# Patient Record
Sex: Male | Born: 1945
Health system: Southern US, Community
[De-identification: ages and names within clinical notes are randomized; demographics above are authoritative.]

## PROBLEM LIST (undated history)

## (undated) DIAGNOSIS — I219 Acute myocardial infarction, unspecified: Secondary | ICD-10-CM

## (undated) DIAGNOSIS — D649 Anemia, unspecified: Secondary | ICD-10-CM

## (undated) DIAGNOSIS — R0902 Hypoxemia: Secondary | ICD-10-CM

## (undated) DIAGNOSIS — IMO0002 Reserved for concepts with insufficient information to code with codable children: Secondary | ICD-10-CM

## (undated) DIAGNOSIS — A419 Sepsis, unspecified organism: Secondary | ICD-10-CM

## (undated) DIAGNOSIS — Z8711 Personal history of peptic ulcer disease: Secondary | ICD-10-CM

## (undated) DIAGNOSIS — E079 Disorder of thyroid, unspecified: Secondary | ICD-10-CM

## (undated) DIAGNOSIS — M1990 Unspecified osteoarthritis, unspecified site: Secondary | ICD-10-CM

## (undated) DIAGNOSIS — Z8719 Personal history of other diseases of the digestive system: Secondary | ICD-10-CM

## (undated) DIAGNOSIS — Z9889 Other specified postprocedural states: Secondary | ICD-10-CM

## (undated) DIAGNOSIS — M797 Fibromyalgia: Secondary | ICD-10-CM

## (undated) DIAGNOSIS — R112 Nausea with vomiting, unspecified: Secondary | ICD-10-CM

## (undated) DIAGNOSIS — G4733 Obstructive sleep apnea (adult) (pediatric): Secondary | ICD-10-CM

## (undated) DIAGNOSIS — R350 Frequency of micturition: Secondary | ICD-10-CM

## (undated) DIAGNOSIS — Z9289 Personal history of other medical treatment: Secondary | ICD-10-CM

## (undated) DIAGNOSIS — K219 Gastro-esophageal reflux disease without esophagitis: Secondary | ICD-10-CM

## (undated) DIAGNOSIS — Z972 Presence of dental prosthetic device (complete) (partial): Secondary | ICD-10-CM

## (undated) DIAGNOSIS — G4730 Sleep apnea, unspecified: Secondary | ICD-10-CM

## (undated) DIAGNOSIS — N189 Chronic kidney disease, unspecified: Secondary | ICD-10-CM

## (undated) DIAGNOSIS — L409 Psoriasis, unspecified: Secondary | ICD-10-CM

## (undated) DIAGNOSIS — Z973 Presence of spectacles and contact lenses: Secondary | ICD-10-CM

## (undated) DIAGNOSIS — Z95810 Presence of automatic (implantable) cardiac defibrillator: Secondary | ICD-10-CM

## (undated) DIAGNOSIS — I447 Left bundle-branch block, unspecified: Secondary | ICD-10-CM

## (undated) DIAGNOSIS — H9319 Tinnitus, unspecified ear: Secondary | ICD-10-CM

## (undated) DIAGNOSIS — R399 Unspecified symptoms and signs involving the genitourinary system: Secondary | ICD-10-CM

## (undated) DIAGNOSIS — C679 Malignant neoplasm of bladder, unspecified: Secondary | ICD-10-CM

## (undated) DIAGNOSIS — K5730 Diverticulosis of large intestine without perforation or abscess without bleeding: Secondary | ICD-10-CM

## (undated) DIAGNOSIS — I509 Heart failure, unspecified: Secondary | ICD-10-CM

## (undated) DIAGNOSIS — I10 Essential (primary) hypertension: Secondary | ICD-10-CM

## (undated) DIAGNOSIS — Z8709 Personal history of other diseases of the respiratory system: Secondary | ICD-10-CM

## (undated) HISTORY — PX: SMALL INTESTINE SURGERY: SHX150

## (undated) HISTORY — PX: APPENDECTOMY: SHX54

## (undated) HISTORY — DX: Chronic kidney disease, unspecified: N18.9

## (undated) HISTORY — DX: Anemia, unspecified: D64.9

## (undated) HISTORY — DX: Hypoxemia: R09.02

## (undated) HISTORY — PX: PROSTATE SURGERY: SHX751

## (undated) HISTORY — DX: Reserved for concepts with insufficient information to code with codable children: IMO0002

## (undated) HISTORY — DX: Disorder of thyroid, unspecified: E07.9

## (undated) HISTORY — PX: FRACTURE SURGERY: SHX138

## (undated) HISTORY — DX: Acute myocardial infarction, unspecified: I21.9

## (undated) HISTORY — PX: OTHER SURGICAL HISTORY: SHX169

## (undated) HISTORY — PX: KNEE ARTHROSCOPY: SUR90

## (undated) HISTORY — DX: Heart failure, unspecified: I50.9

## (undated) HISTORY — PX: HEMORROIDECTOMY: SUR656

## (undated) HISTORY — PX: TONSILLECTOMY: SUR1361

## (undated) HISTORY — PX: JOINT REPLACEMENT: SHX530

---

## 1974-06-01 HISTORY — PX: OTHER SURGICAL HISTORY: SHX169

## 1982-06-01 HISTORY — PX: INGUINAL HERNIA REPAIR: SUR1180

## 1994-06-01 HISTORY — PX: OTHER SURGICAL HISTORY: SHX169

## 1994-06-01 HISTORY — PX: COLOSTOMY TAKEDOWN: SHX5783

## 2002-11-01 ENCOUNTER — Encounter: Admission: RE | Admit: 2002-11-01 | Discharge: 2002-11-01 | Payer: Self-pay | Admitting: Neurosurgery

## 2002-11-01 ENCOUNTER — Encounter: Payer: Self-pay | Admitting: Neurosurgery

## 2003-06-02 HISTORY — PX: OTHER SURGICAL HISTORY: SHX169

## 2004-04-08 ENCOUNTER — Ambulatory Visit: Payer: Self-pay | Admitting: Family Medicine

## 2004-06-01 HISTORY — PX: TOTAL KNEE ARTHROPLASTY: SHX125

## 2005-03-03 ENCOUNTER — Encounter
Admission: RE | Admit: 2005-03-03 | Discharge: 2005-06-01 | Payer: Self-pay | Admitting: Physical Medicine & Rehabilitation

## 2005-03-03 ENCOUNTER — Ambulatory Visit: Payer: Self-pay | Admitting: Physical Medicine & Rehabilitation

## 2005-03-25 ENCOUNTER — Encounter: Admission: RE | Admit: 2005-03-25 | Discharge: 2005-04-03 | Payer: Self-pay | Admitting: *Deleted

## 2005-05-11 ENCOUNTER — Inpatient Hospital Stay (HOSPITAL_COMMUNITY): Admission: RE | Admit: 2005-05-11 | Discharge: 2005-05-16 | Payer: Self-pay | Admitting: Orthopedic Surgery

## 2005-06-08 ENCOUNTER — Encounter: Admission: RE | Admit: 2005-06-08 | Discharge: 2005-09-06 | Payer: Self-pay | Admitting: Orthopedic Surgery

## 2005-09-07 ENCOUNTER — Encounter: Admission: RE | Admit: 2005-09-07 | Discharge: 2005-11-25 | Payer: Self-pay | Admitting: Orthopedic Surgery

## 2005-10-16 ENCOUNTER — Inpatient Hospital Stay (HOSPITAL_COMMUNITY): Admission: RE | Admit: 2005-10-16 | Discharge: 2005-10-20 | Payer: Self-pay | Admitting: Orthopedic Surgery

## 2005-10-16 ENCOUNTER — Ambulatory Visit: Payer: Self-pay | Admitting: Internal Medicine

## 2005-10-28 ENCOUNTER — Inpatient Hospital Stay (HOSPITAL_COMMUNITY): Admission: RE | Admit: 2005-10-28 | Discharge: 2005-10-31 | Payer: Self-pay | Admitting: Orthopedic Surgery

## 2005-11-05 ENCOUNTER — Ambulatory Visit: Payer: Self-pay | Admitting: Internal Medicine

## 2005-11-25 ENCOUNTER — Encounter: Admission: RE | Admit: 2005-11-25 | Discharge: 2006-02-18 | Payer: Self-pay | Admitting: Orthopedic Surgery

## 2005-12-23 ENCOUNTER — Ambulatory Visit: Payer: Self-pay | Admitting: Internal Medicine

## 2006-04-13 ENCOUNTER — Ambulatory Visit: Payer: Self-pay | Admitting: Internal Medicine

## 2006-04-13 LAB — CONVERTED CEMR LAB: CRP: 0.6 mg/dL — ABNORMAL HIGH (ref <0.6)

## 2006-06-22 ENCOUNTER — Ambulatory Visit: Payer: Self-pay | Admitting: Internal Medicine

## 2006-06-22 LAB — CONVERTED CEMR LAB
BUN: 13 mg/dL (ref 6–23)
CRP: 1.8 mg/dL — ABNORMAL HIGH (ref <0.6)
Calcium: 9.9 mg/dL (ref 8.4–10.5)
Creatinine, Ser: 0.92 mg/dL (ref 0.40–1.50)
HCT: 43.9 % (ref 39.0–52.0)
MCHC: 34.4 g/dL (ref 30.0–36.0)
MCV: 93 fL (ref 78.0–100.0)
Platelets: 151 10*3/uL (ref 150–400)
RDW: 13.2 % (ref 11.5–14.0)

## 2006-08-26 DIAGNOSIS — L03119 Cellulitis of unspecified part of limb: Secondary | ICD-10-CM

## 2006-08-26 DIAGNOSIS — L02419 Cutaneous abscess of limb, unspecified: Secondary | ICD-10-CM | POA: Insufficient documentation

## 2012-04-11 ENCOUNTER — Ambulatory Visit: Payer: Medicare Other | Attending: Orthopedic Surgery | Admitting: Physical Therapy

## 2012-04-11 DIAGNOSIS — IMO0001 Reserved for inherently not codable concepts without codable children: Secondary | ICD-10-CM | POA: Insufficient documentation

## 2012-04-11 DIAGNOSIS — M25519 Pain in unspecified shoulder: Secondary | ICD-10-CM | POA: Insufficient documentation

## 2012-04-11 DIAGNOSIS — R5381 Other malaise: Secondary | ICD-10-CM | POA: Insufficient documentation

## 2013-03-23 ENCOUNTER — Encounter (HOSPITAL_COMMUNITY): Payer: Self-pay | Admitting: Pharmacy Technician

## 2013-03-28 ENCOUNTER — Ambulatory Visit (HOSPITAL_COMMUNITY)
Admission: RE | Admit: 2013-03-28 | Discharge: 2013-03-28 | Disposition: A | Discharge status: Home / Self Care | Payer: Medicare Other | Source: Ambulatory Visit | Attending: Anesthesiology | Admitting: Anesthesiology

## 2013-03-28 ENCOUNTER — Encounter (HOSPITAL_COMMUNITY)
Admission: RE | Admit: 2013-03-28 | Discharge: 2013-03-28 | Disposition: A | Discharge status: Home / Self Care | Payer: Medicare Other | Source: Ambulatory Visit | Attending: Orthopedic Surgery | Admitting: Orthopedic Surgery

## 2013-03-28 ENCOUNTER — Encounter (HOSPITAL_COMMUNITY): Payer: Self-pay

## 2013-03-28 DIAGNOSIS — Z01810 Encounter for preprocedural cardiovascular examination: Secondary | ICD-10-CM | POA: Insufficient documentation

## 2013-03-28 DIAGNOSIS — Z01818 Encounter for other preprocedural examination: Secondary | ICD-10-CM | POA: Insufficient documentation

## 2013-03-28 DIAGNOSIS — I517 Cardiomegaly: Secondary | ICD-10-CM | POA: Insufficient documentation

## 2013-03-28 DIAGNOSIS — Z01812 Encounter for preprocedural laboratory examination: Secondary | ICD-10-CM | POA: Insufficient documentation

## 2013-03-28 HISTORY — DX: Fibromyalgia: M79.7

## 2013-03-28 HISTORY — DX: Sleep apnea, unspecified: G47.30

## 2013-03-28 HISTORY — DX: Unspecified osteoarthritis, unspecified site: M19.90

## 2013-03-28 HISTORY — DX: Other specified postprocedural states: Z98.890

## 2013-03-28 HISTORY — DX: Nausea with vomiting, unspecified: R11.2

## 2013-03-28 HISTORY — DX: Essential (primary) hypertension: I10

## 2013-03-28 LAB — TYPE AND SCREEN
ABO/RH(D): O NEG
Antibody Screen: NEGATIVE

## 2013-03-28 LAB — BASIC METABOLIC PANEL
BUN: 18 mg/dL (ref 6–23)
CO2: 31 mEq/L (ref 19–32)
Chloride: 96 mEq/L (ref 96–112)
GFR calc Af Amer: >90 mL/min (ref >90)
Potassium: 3.8 mEq/L (ref 3.5–5.1)
Sodium: 136 mEq/L (ref 135–145)

## 2013-03-28 LAB — CBC
HCT: 46.9 % (ref 39.0–52.0)
Hemoglobin: 16.1 g/dL (ref 13.0–17.0)
RBC: 5.31 MIL/uL (ref 4.22–5.81)
WBC: 6.8 10*3/uL (ref 4.0–10.5)

## 2013-03-28 NOTE — Progress Notes (Signed)
Stress Test requested from [facility], Sleep Study requested from [facility].

## 2013-03-28 NOTE — Pre-Procedure Instructions (Signed)
Billy Rasmussen  03/28/2013   Your procedure is scheduled on:  04-06-2013   Thursday   Report to [facility]  2 * 3 at 5:30  AM.  Call this number if you have problems the morning of surgery: [phone]   Remember:   Do not eat food or drink liquids after midnight.   Take these medicines the morning of surgery with A SIP OF WATER: pain medication as needed,tamsulosin(Flomax),afrin nasal spray as needed   Do not wear jewelry,   Do not wear lotions, powders, or perfumes. .  Do not shave 48 hours prior to surgery. Men may shave face and neck.  Do not bring valuables to the hospital.  [facility] is not responsiblefor any belongings or valuables.               Contacts, dentures or bridgework may not be worn into surgery.   Leave suitcase in the car. After surgery it may be brought to your room.  For patients admitted to the hospital, discharge time is determined by your treatment team.               Patients discharged the day of surgery will not be allowed to drive home.    Special Instructions: Shower using CHG 2 nights before surgery and the night before surgery.  If you shower the day of surgery use CHG.  Use special wash - you have one bottle of CHG for all showers.  You should use approximately 1/3 of the bottle for each shower.   Please read over the following fact sheets that you were given: Pain Booklet, Coughing and Deep Breathing, Blood Transfusion Information and Surgical Site Infection Prevention

## 2013-03-29 NOTE — Progress Notes (Signed)
Anesthesia Chart Review:  Patient is a 67 year old male scheduled for right total shoulder arthroplasty on 04/06/13 by Dr. Rennis Chris.  History includes former smoker, obesity (BMI 36), post-operative N/V, HTN, fibromyalgia, anxiety, depression, OSA, arthritis, left TKA, tonsillectomy, appendectomy, IHR, colostomy takedown. PCP is listed as Prudy Feeler, PA-C. Patient was evaluated at [facility] in the past, but not since 2008 and records are now in storage. Patient reports he had a normal stress test with Dr. Tresa Endo at that time.  Meds: albuterol, Biaxin (thur 04/06/13), Voltaren, Colace, Norco PRN, Hydcodan PRN, lisinopril/HCTZ, Afrin, Flomax, testosterone IM.  EKG on 03/28/13 showed SR with marked sinus arrhythmia, incomplete left BBB.  QRS went from 0.11 to 0.118, but otherwise I think it appears stable when compared to prior EKG on 07/14/04.  CXR on 03/28/13 showed: No active lung disease. Stable mild cardiomegaly.   Preoperative labs noted. WBC is WNL.  I called and spoke with patient.  He is currently being treated for bronchitis, and was started on Biaxin ~ 03/27/13.  He was also given albuterol MDI for wheezing.  He continues to have nasal congestion and occasional productive cough, but is starting to feel better.  His wheezing is better.  He denies fever.  Fortunately, his surgery is still 8 days away.  He denies chest pain and SOB at rest.  He denies any significant DOE. He gets mild dependent ankle edema which is better in the mornings.  He is retired but stays fairly active.  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    Above including EKGs reviewed with anesthesiologist Dr. Krista Blue. Patient has been asymptomatic from a CV standpoint and reported a negative stress test in 2008.  If there is new CV symptomology and he is recovered from his bronchitis then I would  anticipate that he could proceed as planned.  I did tell Mr. Novosel that he should let Dr. Dub Mikes office know if he is having fevers, persistent respiratory symptoms, wheezing, generally not feeling well, etc as this would likely mean that his surgery should be postponed and further PCP follow-up may be warranted.  He verbalized understanding.  I also updated Toniann Fail at Dr. Dub Mikes office.    Patient will be further evaluated by his assigned anesthesiologist on the day of surgery.  Velna Ochs [facility]/Anesthesiology Phone [phone] 03/29/2013 3:15 PM

## 2013-04-05 MED ORDER — LACTATED RINGERS IV SOLN: INTRAVENOUS | Status: DC

## 2013-04-05 MED ORDER — DEXTROSE 5 % IV SOLN
3.0000 g | INTRAVENOUS | Status: AC
  Administered 2013-04-06: 3 g via INTRAVENOUS
  Filled 2013-04-05: qty 3000

## 2013-04-06 ENCOUNTER — Inpatient Hospital Stay (HOSPITAL_COMMUNITY): Payer: Medicare Other | Admitting: Anesthesiology

## 2013-04-06 ENCOUNTER — Encounter (HOSPITAL_COMMUNITY): Payer: Medicare Other | Admitting: Vascular Surgery

## 2013-04-06 ENCOUNTER — Encounter (HOSPITAL_COMMUNITY)
Admission: RE | Disposition: A | Discharge status: Home / Self Care | Payer: Self-pay | Source: Ambulatory Visit | Attending: Orthopedic Surgery

## 2013-04-06 ENCOUNTER — Inpatient Hospital Stay (HOSPITAL_COMMUNITY)
Admission: RE | Admit: 2013-04-06 | Discharge: 2013-04-07 | DRG: 483 | Disposition: A | Discharge status: Home / Self Care | Payer: Medicare Other | Source: Ambulatory Visit | Attending: Orthopedic Surgery | Admitting: Orthopedic Surgery

## 2013-04-06 ENCOUNTER — Encounter (HOSPITAL_COMMUNITY): Payer: Self-pay | Admitting: *Deleted

## 2013-04-06 DIAGNOSIS — F329 Major depressive disorder, single episode, unspecified: Secondary | ICD-10-CM | POA: Diagnosis present

## 2013-04-06 DIAGNOSIS — F411 Generalized anxiety disorder: Secondary | ICD-10-CM | POA: Diagnosis present

## 2013-04-06 DIAGNOSIS — Z96659 Presence of unspecified artificial knee joint: Secondary | ICD-10-CM

## 2013-04-06 DIAGNOSIS — Z87891 Personal history of nicotine dependence: Secondary | ICD-10-CM

## 2013-04-06 DIAGNOSIS — M19019 Primary osteoarthritis, unspecified shoulder: Principal | ICD-10-CM

## 2013-04-06 DIAGNOSIS — F3289 Other specified depressive episodes: Secondary | ICD-10-CM | POA: Diagnosis present

## 2013-04-06 DIAGNOSIS — G4730 Sleep apnea, unspecified: Secondary | ICD-10-CM | POA: Diagnosis present

## 2013-04-06 DIAGNOSIS — I10 Essential (primary) hypertension: Secondary | ICD-10-CM | POA: Diagnosis present

## 2013-04-06 DIAGNOSIS — IMO0001 Reserved for inherently not codable concepts without codable children: Secondary | ICD-10-CM | POA: Diagnosis present

## 2013-04-06 HISTORY — PX: TOTAL SHOULDER ARTHROPLASTY: SHX126

## 2013-04-06 SURGERY — ARTHROPLASTY, SHOULDER, TOTAL
Anesthesia: Regional | Site: Shoulder | Laterality: Right | Wound class: Clean

## 2013-04-06 MED ORDER — SODIUM CHLORIDE 0.9 % IR SOLN
Status: DC | PRN
  Administered 2013-04-06: 1000 mL

## 2013-04-06 MED ORDER — WHITE PETROLATUM GEL
Status: AC
  Administered 2013-04-06: 0.2
  Filled 2013-04-06: qty 5

## 2013-04-06 MED ORDER — HYDROMORPHONE HCL PF 1 MG/ML IJ SOLN: 0.2500 mg | INTRAMUSCULAR | Status: DC | PRN

## 2013-04-06 MED ORDER — ROCURONIUM BROMIDE 100 MG/10ML IV SOLN
INTRAVENOUS | Status: DC | PRN
  Administered 2013-04-06: 50 mg via INTRAVENOUS

## 2013-04-06 MED ORDER — HYDROCHLOROTHIAZIDE 12.5 MG PO CAPS
12.5000 mg | ORAL_CAPSULE | Freq: Every day | ORAL | Status: DC
  Filled 2013-04-06: qty 1

## 2013-04-06 MED ORDER — MENTHOL 3 MG MT LOZG: 1.0000 | LOZENGE | OROMUCOSAL | Status: DC | PRN

## 2013-04-06 MED ORDER — DIPHENHYDRAMINE HCL 12.5 MG/5ML PO ELIX: 12.5000 mg | ORAL_SOLUTION | ORAL | Status: DC | PRN

## 2013-04-06 MED ORDER — ALBUTEROL SULFATE HFA 108 (90 BASE) MCG/ACT IN AERS
INHALATION_SPRAY | RESPIRATORY_TRACT | Status: DC | PRN
  Administered 2013-04-06 (×3): 2 via RESPIRATORY_TRACT

## 2013-04-06 MED ORDER — ALUM & MAG HYDROXIDE-SIMETH 200-200-20 MG/5ML PO SUSP: 30.0000 mL | ORAL | Status: DC | PRN

## 2013-04-06 MED ORDER — METOCLOPRAMIDE HCL 5 MG PO TABS
5.0000 mg | ORAL_TABLET | Freq: Three times a day (TID) | ORAL | Status: DC | PRN
  Filled 2013-04-06: qty 2

## 2013-04-06 MED ORDER — POLYETHYLENE GLYCOL 3350 17 G PO PACK: 17.0000 g | PACK | Freq: Every day | ORAL | Status: DC | PRN

## 2013-04-06 MED ORDER — OXYCODONE HCL 5 MG/5ML PO SOLN: 5.0000 mg | Freq: Once | ORAL | Status: DC | PRN

## 2013-04-06 MED ORDER — ACETAMINOPHEN 325 MG PO TABS: 650.0000 mg | ORAL_TABLET | Freq: Four times a day (QID) | ORAL | Status: DC | PRN

## 2013-04-06 MED ORDER — LISINOPRIL 20 MG PO TABS
20.0000 mg | ORAL_TABLET | Freq: Every day | ORAL | Status: DC
  Filled 2013-04-06: qty 1

## 2013-04-06 MED ORDER — HYDROMORPHONE HCL PF 1 MG/ML IJ SOLN
0.2500 mg | INTRAMUSCULAR | Status: DC | PRN
Start: 2013-04-06 — End: 2013-04-07

## 2013-04-06 MED ORDER — PHENYLEPHRINE HCL 10 MG/ML IJ SOLN
INTRAMUSCULAR | Status: DC | PRN
  Administered 2013-04-06 (×2): 80 ug via INTRAVENOUS
  Administered 2013-04-06: 40 ug via INTRAVENOUS
  Administered 2013-04-06: 120 ug via INTRAVENOUS
  Administered 2013-04-06: 80 ug via INTRAVENOUS

## 2013-04-06 MED ORDER — NEOSTIGMINE METHYLSULFATE 1 MG/ML IJ SOLN
INTRAMUSCULAR | Status: DC | PRN
  Administered 2013-04-06: 4 mg via INTRAVENOUS

## 2013-04-06 MED ORDER — BISACODYL 10 MG RE SUPP: 10.0000 mg | Freq: Every day | RECTAL | Status: DC | PRN

## 2013-04-06 MED ORDER — ONDANSETRON HCL 4 MG PO TABS: 4.0000 mg | ORAL_TABLET | Freq: Four times a day (QID) | ORAL | Status: DC | PRN

## 2013-04-06 MED ORDER — TEMAZEPAM 15 MG PO CAPS
15.0000 mg | ORAL_CAPSULE | Freq: Every evening | ORAL | Status: DC | PRN
Start: 2013-04-06 — End: 2013-04-07

## 2013-04-06 MED ORDER — GLYCOPYRROLATE 0.2 MG/ML IJ SOLN
INTRAMUSCULAR | Status: DC | PRN
  Administered 2013-04-06: 0.6 mg via INTRAVENOUS

## 2013-04-06 MED ORDER — PHENOL 1.4 % MT LIQD: 1.0000 | OROMUCOSAL | Status: DC | PRN

## 2013-04-06 MED ORDER — ONDANSETRON HCL 4 MG/2ML IJ SOLN: 4.0000 mg | Freq: Four times a day (QID) | INTRAMUSCULAR | Status: DC | PRN

## 2013-04-06 MED ORDER — LACTATED RINGERS IV SOLN
INTRAVENOUS | Status: DC | PRN
  Administered 2013-04-06 (×2): via INTRAVENOUS

## 2013-04-06 MED ORDER — OXYCODONE-ACETAMINOPHEN 5-325 MG PO TABS
1.0000 | ORAL_TABLET | ORAL | Status: DC | PRN
  Administered 2013-04-06 – 2013-04-07 (×2): 1 via ORAL
  Filled 2013-04-06 (×2): qty 1

## 2013-04-06 MED ORDER — ONDANSETRON HCL 4 MG/2ML IJ SOLN
INTRAMUSCULAR | Status: DC | PRN
  Administered 2013-04-06: 4 mg via INTRAVENOUS

## 2013-04-06 MED ORDER — MIDAZOLAM HCL 5 MG/5ML IJ SOLN
INTRAMUSCULAR | Status: DC | PRN
  Administered 2013-04-06 (×2): 1 mg via INTRAVENOUS

## 2013-04-06 MED ORDER — ALBUTEROL SULFATE HFA 108 (90 BASE) MCG/ACT IN AERS: 1.0000 | INHALATION_SPRAY | RESPIRATORY_TRACT | Status: DC | PRN

## 2013-04-06 MED ORDER — ACETAMINOPHEN 650 MG RE SUPP: 650.0000 mg | Freq: Four times a day (QID) | RECTAL | Status: DC | PRN

## 2013-04-06 MED ORDER — CEFAZOLIN SODIUM-DEXTROSE 2-3 GM-% IV SOLR
2.0000 g | Freq: Four times a day (QID) | INTRAVENOUS | Status: AC
  Administered 2013-04-06 – 2013-04-07 (×3): 2 g via INTRAVENOUS
  Filled 2013-04-06 (×3): qty 50

## 2013-04-06 MED ORDER — PHENYLEPHRINE HCL 10 MG/ML IJ SOLN
10.0000 mg | INTRAVENOUS | Status: DC | PRN
  Administered 2013-04-06: 25 ug/min via INTRAVENOUS

## 2013-04-06 MED ORDER — CHLORHEXIDINE GLUCONATE 4 % EX LIQD
60.0000 mL | Freq: Once | CUTANEOUS | Status: DC
Start: 2013-04-06 — End: 2013-04-06

## 2013-04-06 MED ORDER — FLEET ENEMA 7-19 GM/118ML RE ENEM: 1.0000 | ENEMA | Freq: Once | RECTAL | Status: AC | PRN

## 2013-04-06 MED ORDER — OXYCODONE HCL 5 MG PO TABS: 5.0000 mg | ORAL_TABLET | Freq: Once | ORAL | Status: DC | PRN

## 2013-04-06 MED ORDER — BUPIVACAINE-EPINEPHRINE PF 0.5-1:200000 % IJ SOLN
INTRAMUSCULAR | Status: DC | PRN
  Administered 2013-04-06: 30 mL

## 2013-04-06 MED ORDER — LIDOCAINE HCL (CARDIAC) 20 MG/ML IV SOLN
INTRAVENOUS | Status: DC | PRN
  Administered 2013-04-06: 70 mg via INTRAVENOUS

## 2013-04-06 MED ORDER — TAMSULOSIN HCL 0.4 MG PO CAPS
0.4000 mg | ORAL_CAPSULE | Freq: Every day | ORAL | Status: DC
  Administered 2013-04-06: 0.4 mg via ORAL
  Filled 2013-04-06 (×2): qty 1

## 2013-04-06 MED ORDER — ALBUMIN HUMAN 5 % IV SOLN
INTRAVENOUS | Status: DC | PRN
  Administered 2013-04-06: 09:00:00 via INTRAVENOUS

## 2013-04-06 MED ORDER — LISINOPRIL-HYDROCHLOROTHIAZIDE 20-12.5 MG PO TABS: 1.0000 | ORAL_TABLET | Freq: Every day | ORAL | Status: DC

## 2013-04-06 MED ORDER — DEXTROSE 5 % IV SOLN
500.0000 mg | Freq: Four times a day (QID) | INTRAVENOUS | Status: DC | PRN
  Filled 2013-04-06: qty 5

## 2013-04-06 MED ORDER — EPHEDRINE SULFATE 50 MG/ML IJ SOLN
INTRAMUSCULAR | Status: DC | PRN
  Administered 2013-04-06 (×3): 5 mg via INTRAVENOUS

## 2013-04-06 MED ORDER — FENTANYL CITRATE 0.05 MG/ML IJ SOLN
INTRAMUSCULAR | Status: DC | PRN
  Administered 2013-04-06: 50 ug via INTRAVENOUS
  Administered 2013-04-06: 100 ug via INTRAVENOUS
  Administered 2013-04-06: 50 ug via INTRAVENOUS

## 2013-04-06 MED ORDER — METOCLOPRAMIDE HCL 5 MG/ML IJ SOLN: 5.0000 mg | Freq: Three times a day (TID) | INTRAMUSCULAR | Status: DC | PRN

## 2013-04-06 MED ORDER — DOCUSATE SODIUM 100 MG PO CAPS
100.0000 mg | ORAL_CAPSULE | Freq: Two times a day (BID) | ORAL | Status: DC
  Administered 2013-04-06: 100 mg via ORAL
  Filled 2013-04-06 (×3): qty 1

## 2013-04-06 MED ORDER — SUCCINYLCHOLINE CHLORIDE 20 MG/ML IJ SOLN
INTRAMUSCULAR | Status: DC | PRN
  Administered 2013-04-06: 100 mg via INTRAVENOUS

## 2013-04-06 MED ORDER — METHOCARBAMOL 500 MG PO TABS
500.0000 mg | ORAL_TABLET | Freq: Four times a day (QID) | ORAL | Status: DC | PRN
  Administered 2013-04-06 – 2013-04-07 (×2): 500 mg via ORAL
  Filled 2013-04-06 (×3): qty 1

## 2013-04-06 MED ORDER — PROPOFOL 10 MG/ML IV BOLUS
INTRAVENOUS | Status: DC | PRN
  Administered 2013-04-06: 200 mg via INTRAVENOUS

## 2013-04-06 MED ORDER — KETOROLAC TROMETHAMINE 15 MG/ML IJ SOLN
15.0000 mg | Freq: Four times a day (QID) | INTRAMUSCULAR | Status: DC
  Administered 2013-04-06 – 2013-04-07 (×3): 15 mg via INTRAVENOUS
  Filled 2013-04-06 (×7): qty 1

## 2013-04-06 MED ORDER — LACTATED RINGERS IV SOLN
INTRAVENOUS | Status: DC
  Administered 2013-04-06: 17:00:00 via INTRAVENOUS

## 2013-04-06 SURGICAL SUPPLY — 60 items
BLADE SAW SGTL 83.5X18.5 (BLADE) ×2 IMPLANT
CEMENT BONE DEPUY (Cement) ×2 IMPLANT
CLOTH BEACON ORANGE TIMEOUT ST (SAFETY) ×2 IMPLANT
CLSR STERI-STRIP ANTIMIC 1/2X4 (GAUZE/BANDAGES/DRESSINGS) ×2 IMPLANT
COVER SURGICAL LIGHT HANDLE (MISCELLANEOUS) ×2 IMPLANT
DRAPE INCISE IOBAN 66X45 STRL (DRAPES) ×2 IMPLANT
DRAPE SURG 17X11 SM STRL (DRAPES) ×2 IMPLANT
DRAPE SURG 17X23 STRL (DRAPES) ×2 IMPLANT
DRAPE U-SHAPE 47X51 STRL (DRAPES) ×2 IMPLANT
DRILL BIT 7/64X5 (BIT) IMPLANT
DRSG AQUACEL AG ADV 3.5X10 (GAUZE/BANDAGES/DRESSINGS) ×2 IMPLANT
DRSG MEPILEX BORDER 4X8 (GAUZE/BANDAGES/DRESSINGS) ×2 IMPLANT
DURAPREP 26ML APPLICATOR (WOUND CARE) ×4 IMPLANT
ELECT BLADE 4.0 EZ CLEAN MEGAD (MISCELLANEOUS) ×2
ELECT CAUTERY BLADE 6.4 (BLADE) ×2 IMPLANT
ELECT REM PT RETURN 9FT ADLT (ELECTROSURGICAL) ×2
ELECTRODE BLDE 4.0 EZ CLN MEGD (MISCELLANEOUS) ×1 IMPLANT
ELECTRODE REM PT RTRN 9FT ADLT (ELECTROSURGICAL) ×1 IMPLANT
FACESHIELD LNG OPTICON STERILE (SAFETY) ×6 IMPLANT
GLENOID ANCHOR PEG CROSSLK 44 (Orthopedic Implant) ×2 IMPLANT
GLOVE BIO SURGEON STRL SZ7.5 (GLOVE) ×2 IMPLANT
GLOVE BIO SURGEON STRL SZ8 (GLOVE) ×2 IMPLANT
GLOVE EUDERMIC 7 POWDERFREE (GLOVE) ×2 IMPLANT
GLOVE SS BIOGEL STRL SZ 7.5 (GLOVE) ×1 IMPLANT
GLOVE SUPERSENSE BIOGEL SZ 7.5 (GLOVE) ×1
GOWN STRL NON-REIN LRG LVL3 (GOWN DISPOSABLE) ×2 IMPLANT
GOWN STRL REIN XL XLG (GOWN DISPOSABLE) ×4 IMPLANT
HEAD HUM ECCENTRIC 48X18 STRL (Trauma) ×2 IMPLANT
KIT BASIN OR (CUSTOM PROCEDURE TRAY) ×2 IMPLANT
KIT ROOM TURNOVER OR (KITS) ×2 IMPLANT
MANIFOLD NEPTUNE II (INSTRUMENTS) ×2 IMPLANT
NDL SUT 6 .5 CRC .975X.05 MAYO (NEEDLE) ×1 IMPLANT
NEEDLE HYPO 25GX1X1/2 BEV (NEEDLE) IMPLANT
NEEDLE MAYO TAPER (NEEDLE) ×1
NS IRRIG 1000ML POUR BTL (IV SOLUTION) ×2 IMPLANT
PACK SHOULDER (CUSTOM PROCEDURE TRAY) ×2 IMPLANT
PAD ARMBOARD 7.5X6 YLW CONV (MISCELLANEOUS) ×4 IMPLANT
PASSER SUT SWANSON 36MM LOOP (INSTRUMENTS) IMPLANT
PIN METAGLENE 2.5 (PIN) ×2 IMPLANT
SLING ARM FOAM STRAP LRG (SOFTGOODS) IMPLANT
SLING ARM FOAM STRAP XLG (SOFTGOODS) ×2 IMPLANT
SMARTMIX MINI TOWER (MISCELLANEOUS) ×2
SPONGE LAP 18X18 X RAY DECT (DISPOSABLE) ×2 IMPLANT
SPONGE LAP 4X18 X RAY DECT (DISPOSABLE) ×2 IMPLANT
STEM HUMERAL 12MM (Trauma) ×2 IMPLANT
STRIP CLOSURE SKIN 1/2X4 (GAUZE/BANDAGES/DRESSINGS) ×2 IMPLANT
SUCTION FRAZIER TIP 10 FR DISP (SUCTIONS) ×2 IMPLANT
SUT BONE WAX W31G (SUTURE) IMPLANT
SUT FIBERWIRE #2 38 T-5 BLUE (SUTURE) ×6
SUT MNCRL AB 3-0 PS2 18 (SUTURE) ×2 IMPLANT
SUT VIC AB 1 CT1 27 (SUTURE) ×3
SUT VIC AB 1 CT1 27XBRD ANBCTR (SUTURE) ×3 IMPLANT
SUT VIC AB 2-0 CT1 27 (SUTURE) ×2
SUT VIC AB 2-0 CT1 TAPERPNT 27 (SUTURE) ×2 IMPLANT
SUTURE FIBERWR #2 38 T-5 BLUE (SUTURE) ×3 IMPLANT
SYR CONTROL 10ML LL (SYRINGE) ×2 IMPLANT
TOWEL OR 17X24 6PK STRL BLUE (TOWEL DISPOSABLE) ×2 IMPLANT
TOWEL OR 17X26 10 PK STRL BLUE (TOWEL DISPOSABLE) ×2 IMPLANT
TOWER SMARTMIX MINI (MISCELLANEOUS) ×1 IMPLANT
WATER STERILE IRR 1000ML POUR (IV SOLUTION) ×2 IMPLANT
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
## 2013-04-06 NOTE — Preoperative (Signed)
Beta Blockers   Reason not to administer Beta Blockers:Not Applicable 

## 2013-04-06 NOTE — Progress Notes (Signed)
Pt. States he had a tooth extracted 3 weeks ago left bottom. States his lower jaw has been hurting for 1 1/2 weeks. States he had follow up with dentist and dentist could not find anything wrong.

## 2013-04-06 NOTE — Anesthesia Postprocedure Evaluation (Signed)
Anesthesia Post Note  Patient: Billy Rasmussen  Procedure(s) Performed: Procedure(s) (LRB): RIGHT TOTAL SHOULDER ARTHROPLASTY (Right)  Anesthesia type: General  Patient location: PACU  Post pain: Pain level controlled and Adequate analgesia  Post assessment: Post-op Vital signs reviewed, Patient's Cardiovascular Status Stable, Respiratory Function Stable, Patent Airway and Pain level controlled  Last Vitals:  Filed Vitals:   04/06/13 1015  BP: 100/46  Pulse: 93  Temp: 36.8 C  Resp: 22    Post vital signs: Reviewed and stable  Level of consciousness: awake, alert  and oriented  Complications: No apparent anesthesia complications

## 2013-04-06 NOTE — Anesthesia Preprocedure Evaluation (Signed)
Anesthesia Evaluation  Patient identified by MRN, date of birth, ID band Patient awake    Reviewed: Allergy & Precautions, H&P , NPO status , Patient's Chart, lab work & pertinent test results  History of Anesthesia Complications (+) PONV  Airway Mallampati: II  Neck ROM: full    Dental   Pulmonary sleep apnea , former smoker,          Cardiovascular hypertension,     Neuro/Psych Anxiety Depression  Neuromuscular disease    GI/Hepatic   Endo/Other  obese  Renal/GU      Musculoskeletal  (+) Arthritis -, Fibromyalgia -  Abdominal   Peds  Hematology   Anesthesia Other Findings   Reproductive/Obstetrics                           Anesthesia Physical Anesthesia Plan  ASA: II  Anesthesia Plan: General and Regional   Post-op Pain Management: MAC Combined w/ Regional for Post-op pain   Induction: Intravenous  Airway Management Planned: Oral ETT  Additional Equipment:   Intra-op Plan:   Post-operative Plan: Extubation in OR  Informed Consent: I have reviewed the patients History and Physical, chart, labs and discussed the procedure including the risks, benefits and alternatives for the proposed anesthesia with the patient or authorized representative who has indicated his/her understanding and acceptance.     Plan Discussed with: CRNA, Anesthesiologist and Surgeon  Anesthesia Plan Comments:         Anesthesia Quick Evaluation

## 2013-04-06 NOTE — Op Note (Signed)
04/06/2013  9:55 AM  PATIENT:   Billy Rasmussen  67 y.o. male  PRE-OPERATIVE DIAGNOSIS:  oa right shoulder   POST-OPERATIVE DIAGNOSIS:  same  PROCEDURE:  R TSA 12 stem, 48x18 eccentric head, 44 glenoid  SURGEON:  Naftula Donahue, Vania Rea M.D.  ASSISTANTS: Shuford pac   ANESTHESIA:   GET + ISB  EBL: 300  SPECIMEN:  none  Drains: hemovac X1   PATIENT DISPOSITION:  PACU - hemodynamically stable.    PLAN OF CARE: Admit to inpatient   Dictation# [id number], [id number]

## 2013-04-06 NOTE — Transfer of Care (Signed)
Immediate Anesthesia Transfer of Care Note  Patient: Billy Rasmussen  Procedure(s) Performed: Procedure(s): RIGHT TOTAL SHOULDER ARTHROPLASTY (Right)  Patient Location: PACU  Anesthesia Type:General and Regional  Level of Consciousness: awake, oriented, patient cooperative and responds to stimulation  Airway & Oxygen Therapy: Patient Spontanous Breathing and Patient connected to face mask oxygen  Post-op Assessment: Report given to PACU RN and Post -op Vital signs reviewed and stable  Post vital signs: Reviewed and stable  Complications: No apparent anesthesia complications

## 2013-04-06 NOTE — Progress Notes (Signed)
Utilization review completed. Cylinda Santoli, RN, BSN. 

## 2013-04-06 NOTE — Anesthesia Procedure Notes (Signed)
Anesthesia Regional Block:  Interscalene brachial plexus block  Pre-Anesthetic Checklist: ,, timeout performed, Correct Patient, Correct Site, Correct Laterality, Correct Procedure, Correct Position, site marked, Risks and benefits discussed,  Surgical consent,  Pre-op evaluation,  At surgeon's request and post-op pain management  Laterality: Right  Prep: chloraprep       Needles:  Injection technique: Single-shot  Needle Type: Echogenic Stimulator Needle     Needle Length: 5cm 5 cm Needle Gauge: 22 and 22 G    Additional Needles:  Procedures: ultrasound guided (picture in chart) and nerve stimulator Interscalene brachial plexus block  Nerve Stimulator or Paresthesia:  Response: biceps flexion, 0.45 mA,   Additional Responses:   Narrative:  Start time: 04/06/2013 7:02 AM End time: 04/06/2013 7:16 AM Injection made incrementally with aspirations every 5 mL.  Performed by: Personally  Anesthesiologist: Dr Chaney Malling  Additional Notes: Functioning IV was confirmed and monitors were applied.  A 50mm 22ga Arrow echogenic stimulator needle was used. Sterile prep and drape,hand hygiene and sterile gloves were used.  Negative aspiration and negative test dose prior to incremental administration of local anesthetic. The patient tolerated the procedure well.  Ultrasound guidance: relevent anatomy identified, needle position confirmed, local anesthetic spread visualized around nerve(s), vascular puncture avoided.  Image printed for medical record.   Interscalene brachial plexus block

## 2013-04-07 ENCOUNTER — Encounter (HOSPITAL_COMMUNITY): Payer: Self-pay | Admitting: Orthopedic Surgery

## 2013-04-07 DIAGNOSIS — M19019 Primary osteoarthritis, unspecified shoulder: Secondary | ICD-10-CM

## 2013-04-07 MED ORDER — OXYCODONE-ACETAMINOPHEN 5-325 MG PO TABS: 1.0000 | ORAL_TABLET | ORAL | Status: DC | PRN

## 2013-04-07 MED ORDER — PROMETHAZINE HCL 25 MG PO TABS: 25.0000 mg | ORAL_TABLET | Freq: Three times a day (TID) | ORAL | Status: DC | PRN

## 2013-04-07 MED ORDER — DIAZEPAM 5 MG PO TABS: 5.0000 mg | ORAL_TABLET | Freq: Four times a day (QID) | ORAL | Status: DC | PRN

## 2013-04-07 NOTE — Progress Notes (Signed)
Patient states to have been turning in bed while asleep and accidentally pulled out Hemovac drain from R upper shoulder. Upon assessment, end of Hemovac catheter was intact and dressing was slightly covered with bloody drainage. Patient was cleaned from the scant drainage on arm and gown. New sling and ice applied to RUE. Patient tolerated well. Will continue to monitor closely. Billy Din, RN

## 2013-04-07 NOTE — Op Note (Signed)
NAMEMarland Kitchen  Billy, Rasmussen NO.:  [id number]  MEDICAL RECORD NO.:  [id number]  LOCATION:                               FACILITY:  [facility]  PHYSICIAN:  Billy Rasmussen, M.D.  DATE OF BIRTH:  1945/10/04  DATE OF PROCEDURE:  04/06/2013 DATE OF DISCHARGE:  04/07/2013                              OPERATIVE REPORT   ADDENDUM:  Billy Ana A. Shuford, PA-C was used as an Geophysicist/field seismologist throughout this case, essential for help with positioning of the extremity, retraction, tissue manipulation, assistance with implantation of the prosthesis, wound closure, and intraoperative decision making.     Billy Rasmussen, M.D.     KMS/MEDQ  D:  04/06/2013  T:  04/07/2013  Job:  [id number]

## 2013-04-07 NOTE — Discharge Summary (Signed)
PATIENT ID:      Billy Rasmussen  MRN:     [id number] DOB/AGE:    65-May-1947 / 65 y.o.     DISCHARGE SUMMARY  ADMISSION DATE:    04/06/2013 DISCHARGE DATE:    ADMISSION DIAGNOSIS: oa right shoulder  Past Medical History  Diagnosis Date  . PONV (postoperative nausea and vomiting)   . Hypertension   . Anxiety   . Depression   . Shortness of breath     with exertion  . Arthritis   . Fibromyalgia   . Sleep apnea     does not use cpap    DISCHARGE DIAGNOSIS:   Active Problems:   Shoulder arthritis   PROCEDURE: Procedure(s): RIGHT TOTAL SHOULDER ARTHROPLASTY on 04/06/2013  CONSULTS:   none  HISTORY:  See H&P in chart.  HOSPITAL COURSE:  Billy Rasmussen is a 67 y.o. admitted on 04/06/2013 with a chief complaint of right shoulder pain and dysfunction, and found to have a diagnosis of oa right shoulder .  They were brought to the operating room on 04/06/2013 and underwent Procedure(s): RIGHT TOTAL SHOULDER ARTHROPLASTY.    They were given perioperative antibiotics: Anti-infectives   Start     Dose/Rate Route Frequency Ordered Stop   04/06/13 1600  ceFAZolin (ANCEF) IVPB 2 g/50 mL premix     2 g 100 mL/hr over 30 Minutes Intravenous Every 6 hours 04/06/13 1518 04/07/13 0435   04/06/13 0600  ceFAZolin (ANCEF) 3 g in dextrose 5 % 50 mL IVPB     3 g 160 mL/hr over 30 Minutes Intravenous On call to O.R. 04/05/13 1433 04/06/13 0749    .  Patient underwent the above named procedure and tolerated it well. The following day they were hemodynamically stable and pain was controlled on oral analgesics. They were neurovascularly intact to the operative extremity. OT was ordered and worked with patient per protocol. They were medically and orthopaedically stable for discharge on Day 1.    DIAGNOSTIC STUDIES:  RECENT RADIOGRAPHIC STUDIES :  Dg Chest 2 View  03/28/2013   CLINICAL DATA:  Preop for right shoulder replacement  EXAM: CHEST  2 VIEW  COMPARISON:  Portable chest x-ray of  10/29/2005  FINDINGS: No active infiltrate or effusion is seen. Mediastinal contours appear stable. Mild cardiomegaly is stable. No acute bony abnormality is seen.  IMPRESSION: No active lung disease. Stable mild cardiomegaly.   Electronically Signed   By: Dwyane Dee M.D.   On: 03/28/2013 10:18    RECENT VITAL SIGNS:  Patient Vitals for the past 24 hrs:  BP Temp Temp src Pulse Resp SpO2  04/07/13 0629 142/56 mmHg 97.6 F (36.4 C) - 82 18 99 %  04/06/13 2124 145/52 mmHg 99.1 F (37.3 C) - 85 18 98 %  04/06/13 1503 140/64 mmHg 97.8 F (36.6 C) Axillary 83 16 96 %  04/06/13 1424 - 97.4 F (36.3 C) - - - -  04/06/13 1411 117/65 mmHg - - 110 23 96 %  04/06/13 1341 94/64 mmHg - - 108 19 98 %  04/06/13 1326 101/67 mmHg - - 102 11 98 %  04/06/13 1311 104/66 mmHg - - 103 23 98 %  04/06/13 1256 106/65 mmHg - - 102 23 98 %  04/06/13 1241 101/70 mmHg - - 108 20 96 %  04/06/13 1226 98/54 mmHg - - 94 10 97 %  04/06/13 1211 93/55 mmHg - - 97 13 96 %  04/06/13 1156 96/53 mmHg - - 91  9 95 %  04/06/13 1145 - 97.5 F (36.4 C) - - - -  04/06/13 1141 97/55 mmHg - - 89 16 96 %  04/06/13 1130 - - - 86 12 97 %  04/06/13 1126 94/62 mmHg - - 86 8 97 %  04/06/13 1115 109/55 mmHg - - 84 11 96 %  04/06/13 1111 109/55 mmHg - - 87 14 97 %  04/06/13 1100 - - - 93 20 97 %  04/06/13 1015 100/46 mmHg 98.2 F (36.8 C) - 93 22 98 %  .  RECENT EKG RESULTS:    Orders placed during the hospital encounter of 03/28/13  . EKG 12-LEAD  . EKG 12-LEAD    DISCHARGE INSTRUCTIONS:    DISCHARGE MEDICATIONS:     Medication List    STOP taking these medications       HYDROcodone-acetaminophen 10-325 MG per tablet  Commonly known as:  NORCO      TAKE these medications       acetaminophen 500 MG tablet  Commonly known as:  TYLENOL  Take 500 mg by mouth every 6 (six) hours as needed.     albuterol 108 (90 BASE) MCG/ACT inhaler  Commonly known as:  PROVENTIL HFA;VENTOLIN HFA  Inhale into the lungs every 4  (four) hours as needed for wheezing.     diazepam 5 MG tablet  Commonly known as:  VALIUM  Take 1 tablet (5 mg total) by mouth every 6 (six) hours as needed for anxiety.     diclofenac 75 MG EC tablet  Commonly known as:  VOLTAREN  Take 150 mg by mouth daily.     docusate sodium 100 MG capsule  Commonly known as:  COLACE  Take 100 mg by mouth 2 (two) times daily as needed for constipation.     HYDROcodone-homatropine 5-1.5 MG/5ML syrup  Commonly known as:  HYCODAN  Take 5-10 mLs by mouth every 6 (six) hours as needed for cough.     lisinopril-hydrochlorothiazide 20-12.5 MG per tablet  Commonly known as:  PRINZIDE,ZESTORETIC  Take 1 tablet by mouth daily.     oxyCODONE-acetaminophen 5-325 MG per tablet  Commonly known as:  PERCOCET  Take 1-2 tablets by mouth every 4 (four) hours as needed.     oxymetazoline 0.05 % nasal spray  Commonly known as:  AFRIN  Place 2 sprays into the nose 2 (two) times daily as needed for congestion.     promethazine 25 MG tablet  Commonly known as:  PHENERGAN  Take 1 tablet (25 mg total) by mouth every 8 (eight) hours as needed for nausea or vomiting.     tamsulosin 0.4 MG Caps capsule  Commonly known as:  FLOMAX  Take 0.4 mg by mouth daily.     testosterone cypionate 200 MG/ML injection  Commonly known as:  DEPOTESTOTERONE CYPIONATE  Inject 200 mg into the muscle every 14 (fourteen) days.      ASK your doctor about these medications       clarithromycin 500 MG tablet  Commonly known as:  BIAXIN  Take 500 mg by mouth 2 (two) times daily. For 10 days        FOLLOW UP VISIT:       Follow-up Information   Follow up with Senaida Lange, MD. (call to be seen in 10-14 days)    Specialty:  Orthopedic Surgery   Contact information:   [address] [place] [address] [phone]       DISCHARGE TO:  Home  DISPOSITION: Good  DISCHARGE CONDITION:  Good   Akila Batta for Dr. Francena Hanly 04/07/2013, 8:38  AM

## 2013-04-07 NOTE — Progress Notes (Signed)
Patient provided with discharge instructions and follow up information. He has been educated on importance of his sling at all times and signs/symptoms of infection. He is going home with support from his wife.

## 2013-04-07 NOTE — Evaluation (Signed)
Occupational Therapy Evaluation and Discharge Patient Details Name: Billy Rasmussen MRN: [id number] DOB: May 09, 1946 Today's Date: 04/07/2013 Time: 1191-4782 OT Time Calculation (min): 41 min  OT Assessment / Plan / Recommendation History of present illness RTSA   Clinical Impression   This 67 yo male s/p above procedure presents to acute OT with all education completed and handout given. Will D/C from acute OT.    OT Assessment  Progress rehab of shoulder as ordered by MD at follow-up appointment    Follow Up Recommendations  No OT follow up       Equipment Recommendations  None recommended by OT          Precautions / Restrictions Precautions Precautions: Shoulder Shoulder Interventions: Shoulder sling/immobilizer;At all times;Off for dressing/bathing/exercises Required Braces or Orthoses: Sling Restrictions Weight Bearing Restrictions: Yes RUE Weight Bearing: Non weight bearing   Pertinent Vitals/Pain 3/10 right shoulder; repositioned   ADL  Transfers/Ambulation Related to ADLs: Independent with all without AD     Acute Rehab OT Goals Patient Stated Goal: To go home  Visit Information  Last OT Received On: 04/07/13 Assistance Needed: +1 History of Present Illness: RTSA       Prior Functioning     Home Living Family/patient expects to be discharged to:: Private residence Living Arrangements: Spouse/significant other Available Help at Discharge: Family;Available 24 hours/day Type of Home: House Additional Comments: Wife and daughter are CNAs and pt's wife has had a rotator cuff repair in the past Prior Function Level of Independence: Independent Communication Communication: No difficulties Dominant Hand: Right         Vision/Perception Vision - History Patient Visual Report: No change from baseline   Cognition  Cognition Arousal/Alertness: Awake/alert Behavior During Therapy: WFL for tasks assessed/performed Overall Cognitive Status: Within  Functional Limits for tasks assessed    Extremity/Trunk Assessment Upper Extremity Assessment Upper Extremity Assessment: RUE deficits/detail RUE Deficits / Details: RTSA, elbow to digits WNF RUE Coordination: decreased gross motor     Mobility Bed Mobility Bed Mobility: Supine to Sit;Sitting - Scoot to Edge of Bed Supine to Sit: 7: Independent;HOB flat Sitting - Scoot to Edge of Bed: 7: Independent Transfers Transfers: Sit to Stand;Stand to Sit Sit to Stand: 7: Independent;With upper extremity assist;From bed (LUE) Stand to Sit: With upper extremity assist;With armrests;To chair/3-in-1 (LUE)     Exercise Other Exercises Other Exercises: Pt educated and performed 10 reps of PROM supine with wife A of 30 degrees (shoulder flexion--pt able to achieve); 60 degrees (shoulder abduction--pt able to achieve); and 90 degrees (shoulder flexion--pt able to achieve)---Nerve block not fully worn off as of yet. Pt also instructed and performed 10 reps each of 4 pendulum exercises and 10 reps of elbow flexion/extension. Pt and wife aware they are to do 10 reps of each 5x/day until they follow up with Dr. Rennis Chris Donning/doffing shirt without moving shoulder: Caregiver independent with task Method for sponge bathing under operated UE: Caregiver independent with task Donning/doffing sling/immobilizer: Caregiver independent with task Correct positioning of sling/immobilizer: Caregiver independent with task Pendulum exercises (written home exercise program): Supervision/safety ROM for elbow, wrist and digits of operated UE: Independent Sling wearing schedule (on at all times/off for ADL's): Independent Proper positioning of operated UE when showering: Independent Dressing change:  (na) Positioning of UE while sleeping: Independent      End of Session OT - End of Session Activity Tolerance: Patient tolerated treatment well Patient left: in chair;with call bell/phone within reach;with family/visitor  present Nurse Communication:  (  OT finished and pt ready to D/C)       Evette Georges [phone] 04/07/2013, 11:08 AM

## 2013-04-07 NOTE — Plan of Care (Signed)
Problem: Consults Goal: Diagnosis- Total Joint Replacement Right Total Shoulder

## 2013-04-07 NOTE — Op Note (Signed)
Billy Rasmussen, Billy Rasmussen NO.:  [id number]  MEDICAL RECORD NO.:  [id number]  LOCATION:  5N01C                        FACILITY:  [facility]  PHYSICIAN:  Vania Rea. Letonya Mangels, M.D.  DATE OF BIRTH:  [birth year]  DATE OF PROCEDURE:  04/06/2013 DATE OF DISCHARGE:                              OPERATIVE REPORT   PREOPERATIVE DIAGNOSIS:  End-stage right shoulder osteoarthritis.  POSTOPERATIVE DIAGNOSIS:  End-stage right shoulder osteoarthritis.  PROCEDURE:  Right total shoulder arthroplasty using a press-fit size 12 DePuy global stem.  A 48 x 18 eccentric head and a cemented pegged 44 glenoid.  SURGEON:  Vania Rea. Jocelynne Duquette, M.D.  Threasa HeadsFrench Ana A. Shuford, PA-C.  ANESTHESIA:  General endotracheal as well as an interscalene block.  ESTIMATED BLOOD LOSS:  300 mL.  DRAINS:  Hemovac x1.  HISTORY:  Billy Rasmussen is a 67 year old gentleman, who has had chronic and progressive increasing right shoulder pain related to end-stage osteoarthritis.  This examination in the office shows profoundly restricted mobility with severe pain and functional limitations.  Plain radiographs confirm end-stage arthrosis with deformity of the humeral head, subchondral sclerosis, peripheral osteophyte formation, and complete obliteration of the joint space with associated bony deformities.  He is brought to the operating room at this time for planned right total shoulder arthroplasty.  Preoperatively, I counseled Billy Rasmussen on treatment options as well as risks versus benefits thereof.  Possible surgical complications were reviewed including the potential for bleeding, infection, neurovascular injury, persistent pain, loss of motion, failure of implant, anesthetic complication, and possible need for additional surgery.  He understands and accepts and agrees with our planned procedure.  DESCRIPTION OF PROCEDURE:  After undergoing routine preop evaluation, the patient received prophylactic antibiotics.   An interscalene block was established in holding area by the Anesthesia Department.  Placed supine on the operating table, underwent smooth induction of a general endotracheal anesthesia.  Placed into beach-chair position and appropriately padded and protected.  The right shoulder region was then sterilely prepped and draped in standard fashion.  Time-out was called. In an anterior approach, the right shoulder was made through a 20 cm deltopectoral incision.  Skin flaps were elevated.  Electrocautery was used for hemostasis.  The deltopectoral interval was identified with cephalic vein, retracted laterally the deltoid, and this interval was then developed from proximal to distal and the upper centimeter and a half of the pectoralis major was tenotomized to enhance exposure. Dissection carried beneath the deltoid, releasing adhesions and the conjoined tendon was identified, retracted medially.  The bicipital groove was then unroofed.  Biceps tendon tenotomized for later tenodesis and then we split the rotator cuff along the rotator interval to the base of the coracoid and then divided the subscap from the lesser tuberosity, leaving a distal centimeter cuff of tissue for later repair. The free margin was tagged with a series of #2 FiberWire grasping sutures.  We then divided the capsule tissues anteriorly and inferiorly, allowing complete delivery of the humeral head through the wound, and divided the capsule posteriorly as well.  At this point, we carefully protected the rotator cuff using extramedullary guide to outline our proposed humeral head resection and this was then performed  with an oscillating saw.  We then used a rongeur to remove the prominent osteophytes on the anterior, inferior, and posterior aspects of the humeral head.  We then used hand reaming to gain access to the humeral medullary canal, reaming up to size 12 with excellent fit.  Billy Rasmussen was then performed and broached  up to size 12, maintaining approximately 30 degrees of retroversion.  Once this was completed, we removed the broach, placed a metal cap over the cut metaphyseal surface of the proximal humerus, then exposed the glenoid.  This was performed with standard [place] Crown, pitchfork, and snake tongue retractors.  I performed a circumferential capsular release and labral resection.  There was noted to be a significant deformity of glenoid with some moderate retroversion.  Once I completely exposed the glenoid, I placed a guide pin in the center of the glenoid and then reamed with a 44 reamer, and then we used a rongeur to remove residual bone and capsular tissues at the margins.  The overall bony surface was much to our satisfaction at the completion of reaming and then I placed a central drill hole and peripheral peg holes using standard technique.  The trial glenoid showed excellent fit and fixation and then also took cared, confirmed there are no residual bony impingement by the superior aspect of the glenoid.  At this point, the glenoid was meticulously cleaned and dried.  Cement was mixed in the appropriate consistency, was introduced into the peripheral peg holes, and our final pegged glenoid was impacted into position with excellent fit and fixation.  Cement was allowed to harden.  At this point, I completed the release of the subscapularis and confirmed there are no additional adhesions.  Once the cement had hardened, we irrigated the joint, we turned our attention to the proximal humerus where we placed our size 12 stem and used bone graft harvested from the resected head to bone graft metaphyseal portion, seated the implants to the appropriate level at approximately 30 degrees retroversion and had excellent fit and fixation.  We then performed some trial reductions and ultimately the 48 x 18 eccentric head had the best fit, and the final head was impacted after we meticulously cleaned the  [facility] taper of the implant.  At this point, the subscapularis was then repaired back to the lesser tuberosity with #2 FiberWire and we performed a closure of the apex of the rotator interval between the upper subscap and supraspinatus with figure-of-eight #2 FiberWire sutures.  We performed a biceps tenodesis at the upper level subscapularis using #2 FiberWire as well. At this point, we did place a Hemovac drain which was brought out anterolaterally.  The deltopectoral interval was then reapproximated with #1 Vicryl.  #2-0 Vicryl was used for subcu layer and intracuticular #3-0 Monocryl for the skin followed by Steri-Strips.  Dry dressing applied.  The right arm was placed in sling, immobilized, and the patient was awakened, extubated, and taken to recovery room in stable condition.     Vania Rea. Analya Louissaint, M.D.     KMS/MEDQ  D:  04/06/2013  T:  04/07/2013  Job:  [id number]

## 2013-05-04 ENCOUNTER — Ambulatory Visit: Payer: Medicare Other | Attending: Orthopedic Surgery | Admitting: Physical Therapy

## 2013-05-04 DIAGNOSIS — M25519 Pain in unspecified shoulder: Secondary | ICD-10-CM | POA: Insufficient documentation

## 2013-05-04 DIAGNOSIS — M25619 Stiffness of unspecified shoulder, not elsewhere classified: Secondary | ICD-10-CM | POA: Insufficient documentation

## 2013-05-04 DIAGNOSIS — R5381 Other malaise: Secondary | ICD-10-CM | POA: Insufficient documentation

## 2013-05-04 DIAGNOSIS — IMO0001 Reserved for inherently not codable concepts without codable children: Secondary | ICD-10-CM | POA: Insufficient documentation

## 2013-05-09 ENCOUNTER — Ambulatory Visit: Payer: Medicare Other | Admitting: Physical Therapy

## 2013-05-11 ENCOUNTER — Ambulatory Visit: Payer: Medicare Other | Admitting: Physical Therapy

## 2013-05-16 ENCOUNTER — Ambulatory Visit: Payer: Medicare Other | Admitting: Physical Therapy

## 2013-05-18 ENCOUNTER — Ambulatory Visit: Payer: Medicare Other | Admitting: Physical Therapy

## 2013-05-23 ENCOUNTER — Ambulatory Visit: Payer: Medicare Other | Admitting: *Deleted

## 2013-05-30 ENCOUNTER — Ambulatory Visit: Payer: Medicare Other | Admitting: *Deleted

## 2013-06-06 ENCOUNTER — Ambulatory Visit: Payer: Medicare Other | Attending: Orthopedic Surgery | Admitting: *Deleted

## 2013-06-06 DIAGNOSIS — IMO0001 Reserved for inherently not codable concepts without codable children: Secondary | ICD-10-CM | POA: Insufficient documentation

## 2013-06-06 DIAGNOSIS — M25619 Stiffness of unspecified shoulder, not elsewhere classified: Secondary | ICD-10-CM | POA: Insufficient documentation

## 2013-06-06 DIAGNOSIS — M25519 Pain in unspecified shoulder: Secondary | ICD-10-CM | POA: Insufficient documentation

## 2013-06-06 DIAGNOSIS — R5381 Other malaise: Secondary | ICD-10-CM | POA: Insufficient documentation

## 2013-06-08 ENCOUNTER — Ambulatory Visit: Payer: Medicare Other | Admitting: *Deleted

## 2013-06-12 ENCOUNTER — Ambulatory Visit: Payer: Medicare Other | Admitting: Physical Therapy

## 2013-06-13 ENCOUNTER — Encounter: Payer: Medicare PPO | Admitting: *Deleted

## 2013-07-07 ENCOUNTER — Encounter (HOSPITAL_COMMUNITY): Payer: Self-pay | Admitting: Pharmacy Technician

## 2013-07-10 ENCOUNTER — Other Ambulatory Visit (HOSPITAL_COMMUNITY): Payer: Self-pay | Admitting: *Deleted

## 2013-07-10 NOTE — Pre-Procedure Instructions (Addendum)
Billy Rasmussen  07/10/2013   Your procedure is scheduled on:  07/20/13  Report to [facility] short stay admitting at 800 AM.  Call this number if you have problems the morning of surgery: [phone]   Remember:   Do not eat food or drink liquids after midnight.   Take these medicines the morning of surgery with A SIP OF WATER: tylenol          STOP all herbel meds, nsaids (aleve,naproxen,advil,ibuprofen) 5 days prior to surgery including any aspirin, vitamins, diclofenac   Do not wear jewelry, make-up or nail polish.  Do not wear lotions, powders, or perfumes. You may wear deodorant.  Do not shave 48 hours prior to surgery. Men may shave face and neck.  Do not bring valuables to the hospital.  [facility] is not responsible                  for any belongings or valuables.               Contacts, dentures or bridgework may not be worn into surgery.  Leave suitcase in the car. After surgery it may be brought to your room.  For patients admitted to the hospital, discharge time is determined by your                treatment team.               Patients discharged the day of surgery will not be allowed to drive  home.  Name and phone number of your driver:   Special Instructions:  Special Instructions: [place] - Preparing for Surgery  Before surgery, you can play an important role.  Because skin is not sterile, your skin needs to be as free of germs as possible.  You can reduce the number of germs on you skin by washing with CHG (chlorahexidine gluconate) soap before surgery.  CHG is an antiseptic cleaner which kills germs and bonds with the skin to continue killing germs even after washing.  Please DO NOT use if you have an allergy to CHG or antibacterial soaps.  If your skin becomes reddened/irritated stop using the CHG and inform your nurse when you arrive at Short Stay.  Do not shave (including legs and underarms) for at least 48 hours prior to the first CHG shower.  You may  shave your face.  Please follow these instructions carefully:   1.  Shower with CHG Soap the night before surgery and the morning of Surgery.  2.  If you choose to wash your hair, wash your hair first as usual with your normal shampoo.  3.  After you shampoo, rinse your hair and body thoroughly to remove the Shampoo.  4.  Use CHG as you would any other liquid soap.  You can apply chg directly  to the skin and wash gently with scrungie or a clean washcloth.  5.  Apply the CHG Soap to your body ONLY FROM THE NECK DOWN.  Do not use on open wounds or open sores.  Avoid contact with your eyes ears, mouth and genitals (private parts).  Wash genitals (private parts)       with your normal soap.  6.  Wash thoroughly, paying special attention to the area where your surgery will be performed.  7.  Thoroughly rinse your body with warm water from the neck down.  8.  DO NOT shower/wash with your normal soap after using and rinsing off the  CHG Soap.  9.  Pat yourself dry with a clean towel.            10.  Wear clean pajamas.            11.  Place clean sheets on your bed the night of your first shower and do not sleep with pets.  Day of Surgery  Do not apply any lotions/deodorants the morning of surgery.  Please wear clean clothes to the hospital/surgery center.   Please read over the following fact sheets that you were given: Pain Booklet, Coughing and Deep Breathing, Blood Transfusion Information, MRSA Information and Surgical Site Infection Prevention

## 2013-07-11 ENCOUNTER — Encounter (HOSPITAL_COMMUNITY)
Admission: RE | Admit: 2013-07-11 | Discharge: 2013-07-11 | Disposition: A | Discharge status: Home / Self Care | Payer: Medicare Other | Source: Ambulatory Visit | Attending: Orthopedic Surgery | Admitting: Orthopedic Surgery

## 2013-07-11 ENCOUNTER — Encounter (HOSPITAL_COMMUNITY): Payer: Self-pay

## 2013-07-11 DIAGNOSIS — Z01812 Encounter for preprocedural laboratory examination: Secondary | ICD-10-CM | POA: Insufficient documentation

## 2013-07-11 LAB — CBC WITH DIFFERENTIAL/PLATELET
Basophils Absolute: 0 10*3/uL (ref 0.0–0.1)
Basophils Relative: 0 % (ref 0–1)
EOS ABS: 0.2 10*3/uL (ref 0.0–0.7)
Eosinophils Relative: 3 % (ref 0–5)
HCT: 43.4 % (ref 39.0–52.0)
HEMOGLOBIN: 15.7 g/dL (ref 13.0–17.0)
LYMPHS ABS: 1.6 10*3/uL (ref 0.7–4.0)
LYMPHS PCT: 28 % (ref 12–46)
MCH: 31.5 pg (ref 26.0–34.0)
MCHC: 36.2 g/dL — ABNORMAL HIGH (ref 30.0–36.0)
MCV: 87.1 fL (ref 78.0–100.0)
MONOS PCT: 9 % (ref 3–12)
Monocytes Absolute: 0.5 10*3/uL (ref 0.1–1.0)
NEUTROS PCT: 60 % (ref 43–77)
Neutro Abs: 3.5 10*3/uL (ref 1.7–7.7)
PLATELETS: 140 10*3/uL — AB (ref 150–400)
RBC: 4.98 MIL/uL (ref 4.22–5.81)
RDW: 14.5 % (ref 11.5–15.5)
WBC: 5.8 10*3/uL (ref 4.0–10.5)

## 2013-07-11 LAB — COMPREHENSIVE METABOLIC PANEL
ALK PHOS: 70 U/L (ref 39–117)
ALT: 36 U/L (ref 0–53)
AST: 42 U/L — ABNORMAL HIGH (ref 0–37)
Albumin: 4.3 g/dL (ref 3.5–5.2)
BILIRUBIN TOTAL: 0.6 mg/dL (ref 0.3–1.2)
BUN: 18 mg/dL (ref 6–23)
CO2: 29 meq/L (ref 19–32)
Calcium: 9.4 mg/dL (ref 8.4–10.5)
Chloride: 100 mEq/L (ref 96–112)
Creatinine, Ser: 0.92 mg/dL (ref 0.50–1.35)
GFR, EST NON AFRICAN AMERICAN: 87 mL/min — AB (ref >90)
GLUCOSE: 118 mg/dL — AB (ref 70–99)
POTASSIUM: 4.1 meq/L (ref 3.7–5.3)
Sodium: 141 mEq/L (ref 137–147)
Total Protein: 7.9 g/dL (ref 6.0–8.3)

## 2013-07-11 LAB — SURGICAL PCR SCREEN
MRSA, PCR: NEGATIVE
Staphylococcus aureus: NEGATIVE

## 2013-07-11 LAB — TYPE AND SCREEN
ABO/RH(D): O NEG
Antibody Screen: NEGATIVE

## 2013-07-11 LAB — APTT: aPTT: 30 seconds (ref 24–37)

## 2013-07-11 LAB — PROTIME-INR
INR: 1.07 (ref 0.00–1.49)
PROTHROMBIN TIME: 13.7 s (ref 11.6–15.2)

## 2013-07-12 NOTE — Progress Notes (Signed)
Anesthesia Chart Review:  Patient is a 68 year old male scheduled for left total shoulder arthroplasty on 07/20/13. He is s/p right total shoulder on 04/06/13.    History includes former smoker, obesity (BMI 36), post-operative N/V, HTN, fibromyalgia, anxiety, depression, OSA, arthritis, left TKA, tonsillectomy, appendectomy, IHR, colostomy takedown. PCP is listed as Particia Nearing, PA-C. Patient was evaluated at [facility] in the past, but not since 2008 and records are now in storage. Patient reports he had a normal stress test with Dr. Claiborne Billings at that time.   EKG on 03/28/13 showed SR with marked sinus arrhythmia, incomplete left BBB. QRS went from 0.11 to 0.118, but otherwise I think it appears stable when compared to prior EKG on 07/14/04 (see Muse).   CXR on 03/28/13 showed: No active lung disease. Stable mild cardiomegaly.   Preoperative labs noted.  I reviewed his chart with anesthesia back in 04/2013.  He has since tolerated similar procedure on the right. If no acute changes then I anticipate that he can proceed as planned.  [facility] Short Stay Center/Anesthesiology Phone [phone] 07/12/2013 12:06 PM

## 2013-07-19 MED ORDER — CEFAZOLIN SODIUM-DEXTROSE 2-3 GM-% IV SOLR
2.0000 g | INTRAVENOUS | Status: AC
  Administered 2013-07-20: 2 g via INTRAVENOUS
  Filled 2013-07-19: qty 50

## 2013-07-20 ENCOUNTER — Encounter (HOSPITAL_COMMUNITY): Payer: Medicare Other | Admitting: Vascular Surgery

## 2013-07-20 ENCOUNTER — Encounter (HOSPITAL_COMMUNITY)
Admission: RE | Disposition: A | Discharge status: Home / Self Care | Payer: Self-pay | Source: Ambulatory Visit | Attending: Orthopedic Surgery

## 2013-07-20 ENCOUNTER — Inpatient Hospital Stay (HOSPITAL_COMMUNITY)
Admission: RE | Admit: 2013-07-20 | Discharge: 2013-07-21 | DRG: 483 | Disposition: A | Discharge status: Home / Self Care | Payer: Medicare Other | Source: Ambulatory Visit | Attending: Orthopedic Surgery | Admitting: Orthopedic Surgery

## 2013-07-20 ENCOUNTER — Encounter (HOSPITAL_COMMUNITY): Payer: Self-pay | Admitting: *Deleted

## 2013-07-20 ENCOUNTER — Inpatient Hospital Stay (HOSPITAL_COMMUNITY): Payer: Medicare Other | Admitting: Anesthesiology

## 2013-07-20 DIAGNOSIS — F3289 Other specified depressive episodes: Secondary | ICD-10-CM | POA: Diagnosis present

## 2013-07-20 DIAGNOSIS — F329 Major depressive disorder, single episode, unspecified: Secondary | ICD-10-CM | POA: Diagnosis present

## 2013-07-20 DIAGNOSIS — G4730 Sleep apnea, unspecified: Secondary | ICD-10-CM | POA: Diagnosis present

## 2013-07-20 DIAGNOSIS — Z96659 Presence of unspecified artificial knee joint: Secondary | ICD-10-CM

## 2013-07-20 DIAGNOSIS — F411 Generalized anxiety disorder: Secondary | ICD-10-CM | POA: Diagnosis present

## 2013-07-20 DIAGNOSIS — I10 Essential (primary) hypertension: Secondary | ICD-10-CM | POA: Diagnosis present

## 2013-07-20 DIAGNOSIS — M19019 Primary osteoarthritis, unspecified shoulder: Principal | ICD-10-CM | POA: Diagnosis present

## 2013-07-20 DIAGNOSIS — Z79899 Other long term (current) drug therapy: Secondary | ICD-10-CM

## 2013-07-20 DIAGNOSIS — Z9089 Acquired absence of other organs: Secondary | ICD-10-CM

## 2013-07-20 DIAGNOSIS — Z96619 Presence of unspecified artificial shoulder joint: Secondary | ICD-10-CM

## 2013-07-20 DIAGNOSIS — Z87891 Personal history of nicotine dependence: Secondary | ICD-10-CM

## 2013-07-20 HISTORY — PX: TOTAL SHOULDER ARTHROPLASTY: SHX126

## 2013-07-20 SURGERY — ARTHROPLASTY, SHOULDER, TOTAL
Anesthesia: General | Site: Shoulder | Laterality: Left

## 2013-07-20 MED ORDER — ONDANSETRON HCL 4 MG/2ML IJ SOLN
INTRAMUSCULAR | Status: DC | PRN
  Administered 2013-07-20: 4 mg via INTRAVENOUS

## 2013-07-20 MED ORDER — MENTHOL 3 MG MT LOZG
1.0000 | LOZENGE | OROMUCOSAL | Status: DC | PRN
  Administered 2013-07-20: 3 mg via ORAL
  Filled 2013-07-20: qty 9

## 2013-07-20 MED ORDER — METOCLOPRAMIDE HCL 5 MG/ML IJ SOLN
5.0000 mg | Freq: Three times a day (TID) | INTRAMUSCULAR | Status: DC | PRN
  Administered 2013-07-20: 10 mg via INTRAVENOUS
  Filled 2013-07-20: qty 2

## 2013-07-20 MED ORDER — FENTANYL CITRATE 0.05 MG/ML IJ SOLN
INTRAMUSCULAR | Status: AC
  Filled 2013-07-20: qty 2

## 2013-07-20 MED ORDER — POLYETHYLENE GLYCOL 3350 17 G PO PACK: 17.0000 g | PACK | Freq: Every day | ORAL | Status: DC | PRN

## 2013-07-20 MED ORDER — LIDOCAINE HCL 4 % MT SOLN
OROMUCOSAL | Status: DC | PRN
  Administered 2013-07-20: 2 mL via TOPICAL

## 2013-07-20 MED ORDER — LIDOCAINE HCL (CARDIAC) 20 MG/ML IV SOLN
INTRAVENOUS | Status: AC
  Filled 2013-07-20: qty 5

## 2013-07-20 MED ORDER — GLYCOPYRROLATE 0.2 MG/ML IJ SOLN
INTRAMUSCULAR | Status: DC | PRN
  Administered 2013-07-20 (×2): 0.4 mg via INTRAVENOUS

## 2013-07-20 MED ORDER — ONDANSETRON HCL 4 MG/2ML IJ SOLN
INTRAMUSCULAR | Status: AC
  Filled 2013-07-20: qty 2

## 2013-07-20 MED ORDER — KETOROLAC TROMETHAMINE 15 MG/ML IJ SOLN
15.0000 mg | Freq: Four times a day (QID) | INTRAMUSCULAR | Status: DC
  Administered 2013-07-20 – 2013-07-21 (×4): 15 mg via INTRAVENOUS
  Filled 2013-07-20 (×7): qty 1

## 2013-07-20 MED ORDER — MIDAZOLAM HCL 2 MG/2ML IJ SOLN
INTRAMUSCULAR | Status: AC
  Filled 2013-07-20: qty 2

## 2013-07-20 MED ORDER — GLYCOPYRROLATE 0.2 MG/ML IJ SOLN
INTRAMUSCULAR | Status: AC
  Filled 2013-07-20: qty 2

## 2013-07-20 MED ORDER — HYDROMORPHONE HCL PF 1 MG/ML IJ SOLN
INTRAMUSCULAR | Status: AC
  Filled 2013-07-20: qty 1

## 2013-07-20 MED ORDER — DIPHENHYDRAMINE HCL 12.5 MG/5ML PO ELIX: 12.5000 mg | ORAL_SOLUTION | ORAL | Status: DC | PRN

## 2013-07-20 MED ORDER — ONDANSETRON HCL 4 MG PO TABS
4.0000 mg | ORAL_TABLET | Freq: Four times a day (QID) | ORAL | Status: DC | PRN
  Administered 2013-07-21: 4 mg via ORAL
  Filled 2013-07-20: qty 1

## 2013-07-20 MED ORDER — PHENOL 1.4 % MT LIQD: 1.0000 | OROMUCOSAL | Status: DC | PRN

## 2013-07-20 MED ORDER — EPHEDRINE SULFATE 50 MG/ML IJ SOLN
INTRAMUSCULAR | Status: AC
  Filled 2013-07-20: qty 1

## 2013-07-20 MED ORDER — DIAZEPAM 5 MG PO TABS
2.5000 mg | ORAL_TABLET | Freq: Four times a day (QID) | ORAL | Status: DC | PRN
  Administered 2013-07-21: 5 mg via ORAL
  Filled 2013-07-20: qty 1

## 2013-07-20 MED ORDER — PHENYLEPHRINE HCL 10 MG/ML IJ SOLN
INTRAMUSCULAR | Status: DC | PRN
  Administered 2013-07-20: 120 ug via INTRAVENOUS

## 2013-07-20 MED ORDER — ROCURONIUM BROMIDE 100 MG/10ML IV SOLN
INTRAVENOUS | Status: DC | PRN
  Administered 2013-07-20: 50 mg via INTRAVENOUS

## 2013-07-20 MED ORDER — ACETAMINOPHEN 650 MG RE SUPP: 650.0000 mg | Freq: Four times a day (QID) | RECTAL | Status: DC | PRN

## 2013-07-20 MED ORDER — ACETAMINOPHEN 325 MG PO TABS: 650.0000 mg | ORAL_TABLET | Freq: Four times a day (QID) | ORAL | Status: DC | PRN

## 2013-07-20 MED ORDER — DOCUSATE SODIUM 100 MG PO CAPS
100.0000 mg | ORAL_CAPSULE | Freq: Two times a day (BID) | ORAL | Status: DC
  Administered 2013-07-20 – 2013-07-21 (×3): 100 mg via ORAL
  Filled 2013-07-20 (×4): qty 1

## 2013-07-20 MED ORDER — CEFAZOLIN SODIUM-DEXTROSE 2-3 GM-% IV SOLR
2.0000 g | Freq: Four times a day (QID) | INTRAVENOUS | Status: AC
  Administered 2013-07-20 – 2013-07-21 (×3): 2 g via INTRAVENOUS
  Filled 2013-07-20 (×3): qty 50

## 2013-07-20 MED ORDER — BISACODYL 5 MG PO TBEC: 5.0000 mg | DELAYED_RELEASE_TABLET | Freq: Every day | ORAL | Status: DC | PRN

## 2013-07-20 MED ORDER — GLYCOPYRROLATE 0.2 MG/ML IJ SOLN
INTRAMUSCULAR | Status: AC
  Filled 2013-07-20: qty 1

## 2013-07-20 MED ORDER — HYDROMORPHONE HCL PF 1 MG/ML IJ SOLN
0.2500 mg | INTRAMUSCULAR | Status: DC | PRN
  Administered 2013-07-21: 1 mg via INTRAVENOUS
  Filled 2013-07-20: qty 1

## 2013-07-20 MED ORDER — PROPOFOL 10 MG/ML IV BOLUS
INTRAVENOUS | Status: AC
  Filled 2013-07-20: qty 20

## 2013-07-20 MED ORDER — HYDROCHLOROTHIAZIDE 12.5 MG PO CAPS
12.5000 mg | ORAL_CAPSULE | Freq: Every day | ORAL | Status: DC
  Administered 2013-07-21: 12.5 mg via ORAL
  Filled 2013-07-20: qty 1

## 2013-07-20 MED ORDER — MIDAZOLAM HCL 5 MG/5ML IJ SOLN
INTRAMUSCULAR | Status: DC | PRN
  Administered 2013-07-20: 2 mg via INTRAVENOUS

## 2013-07-20 MED ORDER — PROPOFOL 10 MG/ML IV BOLUS
INTRAVENOUS | Status: DC | PRN
  Administered 2013-07-20: 200 mg via INTRAVENOUS

## 2013-07-20 MED ORDER — KETOROLAC TROMETHAMINE 30 MG/ML IJ SOLN
INTRAMUSCULAR | Status: AC
  Filled 2013-07-20: qty 1

## 2013-07-20 MED ORDER — PHENYLEPHRINE 40 MCG/ML (10ML) SYRINGE FOR IV PUSH (FOR BLOOD PRESSURE SUPPORT)
PREFILLED_SYRINGE | INTRAVENOUS | Status: AC
  Filled 2013-07-20: qty 10

## 2013-07-20 MED ORDER — CALCIUM CHLORIDE 10 % IV SOLN
INTRAVENOUS | Status: DC | PRN
  Administered 2013-07-20: 100 mg via INTRAVENOUS
  Administered 2013-07-20 (×2): 50 mg via INTRAVENOUS

## 2013-07-20 MED ORDER — ONDANSETRON HCL 4 MG/2ML IJ SOLN: 4.0000 mg | Freq: Once | INTRAMUSCULAR | Status: DC | PRN

## 2013-07-20 MED ORDER — HYDROMORPHONE HCL PF 1 MG/ML IJ SOLN
0.2500 mg | INTRAMUSCULAR | Status: DC | PRN
  Administered 2013-07-20 (×3): 0.5 mg via INTRAVENOUS

## 2013-07-20 MED ORDER — ROCURONIUM BROMIDE 50 MG/5ML IV SOLN
INTRAVENOUS | Status: AC
  Filled 2013-07-20: qty 1

## 2013-07-20 MED ORDER — ALUM & MAG HYDROXIDE-SIMETH 200-200-20 MG/5ML PO SUSP: 30.0000 mL | ORAL | Status: DC | PRN

## 2013-07-20 MED ORDER — ONDANSETRON HCL 4 MG/2ML IJ SOLN
4.0000 mg | Freq: Four times a day (QID) | INTRAMUSCULAR | Status: DC | PRN
  Administered 2013-07-20: 4 mg via INTRAVENOUS

## 2013-07-20 MED ORDER — FENTANYL CITRATE 0.05 MG/ML IJ SOLN
INTRAMUSCULAR | Status: DC | PRN
  Administered 2013-07-20: 100 ug via INTRAVENOUS
  Administered 2013-07-20: 50 ug via INTRAVENOUS

## 2013-07-20 MED ORDER — CHLORHEXIDINE GLUCONATE 4 % EX LIQD
60.0000 mL | Freq: Once | CUTANEOUS | Status: DC
  Filled 2013-07-20: qty 60

## 2013-07-20 MED ORDER — LISINOPRIL-HYDROCHLOROTHIAZIDE 20-12.5 MG PO TABS: 1.0000 | ORAL_TABLET | Freq: Every day | ORAL | Status: DC

## 2013-07-20 MED ORDER — SODIUM CHLORIDE 0.9 % IR SOLN
Status: DC | PRN
  Administered 2013-07-20: 1000 mL

## 2013-07-20 MED ORDER — LACTATED RINGERS IV SOLN
INTRAVENOUS | Status: DC
Start: 2013-07-20 — End: 2013-07-20
  Administered 2013-07-20 (×2): via INTRAVENOUS

## 2013-07-20 MED ORDER — FENTANYL CITRATE 0.05 MG/ML IJ SOLN
INTRAMUSCULAR | Status: AC
  Filled 2013-07-20: qty 5

## 2013-07-20 MED ORDER — LIDOCAINE HCL (CARDIAC) 20 MG/ML IV SOLN
INTRAVENOUS | Status: DC | PRN
  Administered 2013-07-20: 60 mg via INTRAVENOUS

## 2013-07-20 MED ORDER — SODIUM CHLORIDE 0.9 % IV SOLN
10.0000 mg | INTRAVENOUS | Status: DC | PRN
  Administered 2013-07-20: 15 ug/min via INTRAVENOUS

## 2013-07-20 MED ORDER — NEOSTIGMINE METHYLSULFATE 1 MG/ML IJ SOLN
INTRAMUSCULAR | Status: DC | PRN
  Administered 2013-07-20: 3 mg via INTRAVENOUS

## 2013-07-20 MED ORDER — SODIUM CHLORIDE 0.9 % IJ SOLN
INTRAMUSCULAR | Status: AC
  Filled 2013-07-20: qty 10

## 2013-07-20 MED ORDER — OXYCODONE-ACETAMINOPHEN 5-325 MG PO TABS
ORAL_TABLET | ORAL | Status: AC
  Filled 2013-07-20: qty 2

## 2013-07-20 MED ORDER — LISINOPRIL 20 MG PO TABS
20.0000 mg | ORAL_TABLET | Freq: Every day | ORAL | Status: DC
  Administered 2013-07-21: 20 mg via ORAL
  Filled 2013-07-20: qty 1

## 2013-07-20 MED ORDER — METOCLOPRAMIDE HCL 10 MG PO TABS: 5.0000 mg | ORAL_TABLET | Freq: Three times a day (TID) | ORAL | Status: DC | PRN

## 2013-07-20 MED ORDER — HYDROMORPHONE HCL PF 1 MG/ML IJ SOLN
INTRAMUSCULAR | Status: AC
Start: 2013-07-20 — End: 2013-07-21
  Filled 2013-07-20: qty 1

## 2013-07-20 MED ORDER — LACTATED RINGERS IV SOLN
INTRAVENOUS | Status: DC
  Administered 2013-07-20 (×2): via INTRAVENOUS

## 2013-07-20 MED ORDER — OXYCODONE-ACETAMINOPHEN 5-325 MG PO TABS
1.0000 | ORAL_TABLET | ORAL | Status: DC | PRN
  Administered 2013-07-20 – 2013-07-21 (×4): 2 via ORAL
  Filled 2013-07-20 (×3): qty 2

## 2013-07-20 MED ORDER — FENTANYL CITRATE 0.05 MG/ML IJ SOLN
100.0000 ug | Freq: Once | INTRAMUSCULAR | Status: AC
  Administered 2013-07-20: 100 ug via INTRAVENOUS

## 2013-07-20 MED ORDER — FLEET ENEMA 7-19 GM/118ML RE ENEM: 1.0000 | ENEMA | Freq: Once | RECTAL | Status: AC | PRN

## 2013-07-20 SURGICAL SUPPLY — 63 items
BLADE SAW SGTL 83.5X18.5 (BLADE) ×3 IMPLANT
CEMENT BONE DEPUY (Cement) ×3 IMPLANT
CLOSURE STERI-STRIP 1/2X4 (GAUZE/BANDAGES/DRESSINGS) ×1
CLOSURE WOUND 1/2 X4 (GAUZE/BANDAGES/DRESSINGS) ×1
CLOTH BEACON ORANGE TIMEOUT ST (SAFETY) ×3 IMPLANT
CLSR STERI-STRIP ANTIMIC 1/2X4 (GAUZE/BANDAGES/DRESSINGS) ×2 IMPLANT
COVER SURGICAL LIGHT HANDLE (MISCELLANEOUS) ×3 IMPLANT
DRAPE INCISE IOBAN 66X45 STRL (DRAPES) ×3 IMPLANT
DRAPE SURG 17X11 SM STRL (DRAPES) ×3 IMPLANT
DRAPE SURG 17X23 STRL (DRAPES) ×3 IMPLANT
DRAPE U-SHAPE 47X51 STRL (DRAPES) ×3 IMPLANT
DRILL BIT 7/64X5 (BIT) IMPLANT
DRSG AQUACEL AG ADV 3.5X10 (GAUZE/BANDAGES/DRESSINGS) ×3 IMPLANT
DRSG MEPILEX BORDER 4X4 (GAUZE/BANDAGES/DRESSINGS) ×3 IMPLANT
DRSG MEPILEX BORDER 4X8 (GAUZE/BANDAGES/DRESSINGS) ×3 IMPLANT
DURAPREP 26ML APPLICATOR (WOUND CARE) ×6 IMPLANT
ELECT BLADE 4.0 EZ CLEAN MEGAD (MISCELLANEOUS) ×3
ELECT CAUTERY BLADE 6.4 (BLADE) ×3 IMPLANT
ELECT REM PT RETURN 9FT ADLT (ELECTROSURGICAL) ×3
ELECTRODE BLDE 4.0 EZ CLN MEGD (MISCELLANEOUS) ×1 IMPLANT
ELECTRODE REM PT RTRN 9FT ADLT (ELECTROSURGICAL) ×1 IMPLANT
FACESHIELD LNG OPTICON STERILE (SAFETY) ×9 IMPLANT
GLENOID ANCHOR PEG CROSSLK 44 (Orthopedic Implant) ×3 IMPLANT
GLOVE BIO SURGEON STRL SZ7.5 (GLOVE) ×3 IMPLANT
GLOVE BIO SURGEON STRL SZ8 (GLOVE) ×3 IMPLANT
GLOVE EUDERMIC 7 POWDERFREE (GLOVE) ×3 IMPLANT
GLOVE SS BIOGEL STRL SZ 7.5 (GLOVE) ×1 IMPLANT
GLOVE SUPERSENSE BIOGEL SZ 7.5 (GLOVE) ×2
GOWN STRL NON-REIN LRG LVL3 (GOWN DISPOSABLE) ×3 IMPLANT
GOWN STRL REIN XL XLG (GOWN DISPOSABLE) ×6 IMPLANT
HEAD HUM ECCENTRIC 48X18 STRL (Trauma) ×3 IMPLANT
HUMERAL STEM 14MM (Trauma) ×3 IMPLANT
KIT BASIN OR (CUSTOM PROCEDURE TRAY) ×3 IMPLANT
KIT ROOM TURNOVER OR (KITS) ×3 IMPLANT
MANIFOLD NEPTUNE II (INSTRUMENTS) ×3 IMPLANT
NDL SUT 6 .5 CRC .975X.05 MAYO (NEEDLE) ×1 IMPLANT
NEEDLE HYPO 25GX1X1/2 BEV (NEEDLE) IMPLANT
NEEDLE MAYO TAPER (NEEDLE) ×2
NS IRRIG 1000ML POUR BTL (IV SOLUTION) ×3 IMPLANT
PACK SHOULDER (CUSTOM PROCEDURE TRAY) ×3 IMPLANT
PAD ARMBOARD 7.5X6 YLW CONV (MISCELLANEOUS) ×6 IMPLANT
PASSER SUT SWANSON 36MM LOOP (INSTRUMENTS) IMPLANT
PIN METAGLENE 2.5 (PIN) ×3 IMPLANT
SLING ARM LRG ADULT FOAM STRAP (SOFTGOODS) IMPLANT
SLING ARM XL FOAM STRAP (SOFTGOODS) ×3 IMPLANT
SMARTMIX MINI TOWER (MISCELLANEOUS) ×3
SPONGE LAP 18X18 X RAY DECT (DISPOSABLE) ×3 IMPLANT
SPONGE LAP 4X18 X RAY DECT (DISPOSABLE) ×3 IMPLANT
STRIP CLOSURE SKIN 1/2X4 (GAUZE/BANDAGES/DRESSINGS) ×2 IMPLANT
SUCTION FRAZIER TIP 10 FR DISP (SUCTIONS) ×3 IMPLANT
SUT BONE WAX W31G (SUTURE) IMPLANT
SUT FIBERWIRE #2 38 T-5 BLUE (SUTURE) ×6
SUT MNCRL AB 3-0 PS2 18 (SUTURE) ×3 IMPLANT
SUT VIC AB 1 CT1 27 (SUTURE) ×6
SUT VIC AB 1 CT1 27XBRD ANBCTR (SUTURE) ×3 IMPLANT
SUT VIC AB 2-0 CT1 27 (SUTURE) ×4
SUT VIC AB 2-0 CT1 TAPERPNT 27 (SUTURE) ×2 IMPLANT
SUTURE FIBERWR #2 38 T-5 BLUE (SUTURE) ×2 IMPLANT
SYR CONTROL 10ML LL (SYRINGE) IMPLANT
TOWEL OR 17X24 6PK STRL BLUE (TOWEL DISPOSABLE) ×3 IMPLANT
TOWEL OR 17X26 10 PK STRL BLUE (TOWEL DISPOSABLE) ×3 IMPLANT
TOWER SMARTMIX MINI (MISCELLANEOUS) ×1 IMPLANT
WATER STERILE IRR 1000ML POUR (IV SOLUTION) ×3 IMPLANT

## 2013-07-20 NOTE — Progress Notes (Signed)
Utilization review completed.  
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
## 2013-07-20 NOTE — Anesthesia Preprocedure Evaluation (Addendum)
Anesthesia Evaluation  Patient identified by MRN, date of birth, ID band Patient awake    Reviewed: Allergy & Precautions, H&P , NPO status , Patient's Chart, lab work & pertinent test results  History of Anesthesia Complications (+) PONV  Airway Mallampati: I      Dental  (+) Teeth Intact   Pulmonary sleep apnea and Oxygen sleep apnea , former smoker,          Cardiovascular hypertension, Pt. on medications     Neuro/Psych  Neuromuscular disease    GI/Hepatic   Endo/Other    Renal/GU      Musculoskeletal  (+) Arthritis -,   Abdominal   Peds  Hematology   Anesthesia Other Findings   Reproductive/Obstetrics                          Anesthesia Physical Anesthesia Plan  ASA: II  Anesthesia Plan: General   Post-op Pain Management:    Induction: Intravenous  Airway Management Planned: Oral ETT  Additional Equipment:   Intra-op Plan:   Post-operative Plan: Extubation in OR  Informed Consent: I have reviewed the patients History and Physical, chart, labs and discussed the procedure including the risks, benefits and alternatives for the proposed anesthesia with the patient or authorized representative who has indicated his/her understanding and acceptance.     Plan Discussed with:   Anesthesia Plan Comments:         Anesthesia Quick Evaluation

## 2013-07-20 NOTE — Preoperative (Signed)
Beta Blockers   Reason not to administer Beta Blockers:Not Applicable 

## 2013-07-20 NOTE — Discharge Instructions (Signed)
° °  Kevin M. Supple, M.D., F.A.A.O.S. °Orthopaedic Surgery °Specializing in Arthroscopic and Reconstructive °Surgery of the Shoulder and Knee °[phone] °[address] - [place], [place] [address] - Fax [phone] ° ° °POST-OP TOTAL SHOULDER REPLACEMENT/SHOULDER HEMIARTHROPLASTY INSTRUCTIONS ° °1. Call the office at [phone] to schedule your first post-op appointment 10-14 days from the date of your surgery. ° °2. The bandage over your incision is waterproof. You may begin showering with this dressing on. You may leave this dressing on until first follow up appointment within 2 weeks. If you would like to remove it you may do so after the 5th day. Go slow and tug at the borders gently to break the bond the dressing has with the skin. The steri strips may come off with the dressing. At this point if there is no drainage it is okay to go without a bandage or you may cover it with a light guaze and tape. Leave the steri-strips in place over your incision. You can expect drainage that is bloody or yellow in nature that should gradually decrease from day of surgery. Change your dressing daily until drainage is completely resolved, then you may feel free to go without a bandage. You can also expect significant bruising around your shoulder that will drift down your arm and into your chest wall. This is very normal and should resolve over several days. ° ° 3. Wear your sling/immobilizer at all times except to perform the exercises below or to occasionally let your arm dangle by your side to stretch your elbow. You also need to sleep in your sling immobilizer until instructed otherwise. ° °4. Range of motion to your elbow, wrist, and hand are encouraged 3-5 times daily. Exercise to your hand and fingers helps to reduce swelling you may experience. ° °5. Utilize ice to the shoulder 3-5 times minimum a day and additionally if you are experiencing pain. ° °6. Prescriptions for a pain medication and a muscle  relaxant are provided for you. It is recommended that if you are experiencing pain that you pain medication alone is not controlling, add the muscle relaxant along with the pain medication which can give additional pain relief. The first 1-2 days is generally the most severe of your pain and then should gradually decrease. As your pain lessens it is recommended that you decrease your use of the pain medications to an "as needed basis'" only and to always comply with the recommended dosages of the pain medications. ° °7. Pain medications can produce constipation along with their use. If you experience this, the use of an over the counter stool softener or laxative daily is recommended.  ° °8. For most patients, if insurance allows, home health services to include therapy has been arranged. ° °9. For additional questions or concerns, please do not hesitate to call the office. If after hours there is an answering service to forward your concerns to the physician on call. ° °POST-OP EXERCISES ° °Pendulum Exercises ° °Perform pendulum exercises while standing and bending at the waist. Support your uninvolved arm on a table or chair and allow your operated arm to hang freely. Make sure to do these exercises passively - not using you shoulder muscles. ° °Repeat 20 times. Do 3 sessions per day. ° ° ° ° °

## 2013-07-20 NOTE — Anesthesia Procedure Notes (Addendum)
Anesthesia Regional Block:  Interscalene brachial plexus block  Pre-Anesthetic Checklist: ,, timeout performed, Correct Patient, Correct Site, Correct Laterality, Correct Procedure, Correct Position, site marked, Risks and benefits discussed,  Surgical consent,  Pre-op evaluation,  At surgeon's request and post-op pain management  Laterality: Left  Prep: chloraprep and alcohol swabs       Needles:  Injection technique: Single-shot  Needle Type: Stimulator Needle - 40        Needle insertion depth: 4 cm   Additional Needles:  Procedures: nerve stimulator Interscalene brachial plexus block  Nerve Stimulator or Paresthesia:  Response: 0.5 mA, 0.1 ms, 4 cm  Additional Responses:   Narrative:  Start time: 07/20/2013 9:35 AM End time: 07/20/2013 9:40 AM Injection made incrementally with aspirations every 5 mL.  Performed by: Personally  Anesthesiologist: Sharolyn Douglas MD  Additional Notes: Pt accepts procedure w/ risks. 15cc 0.5% Marcaine w/ epi w/o difficulty or discomfort. GES   Procedure Name: Intubation Date/Time: 07/20/2013 10:08 AM Performed by: Rush Farmer E Pre-anesthesia Checklist: Patient identified, Emergency Drugs available, Suction available, Patient being monitored and Timeout performed Patient Re-evaluated:Patient Re-evaluated prior to inductionOxygen Delivery Method: Circle system utilized Preoxygenation: Pre-oxygenation with 100% oxygen Intubation Type: IV induction Ventilation: Mask ventilation without difficulty Laryngoscope Size: Mac and 4 Grade View: Grade I Tube type: Oral Tube size: 7.5 mm Number of attempts: 1 Airway Equipment and Method: Stylet Placement Confirmation: ETT inserted through vocal cords under direct vision,  positive ETCO2 and breath sounds checked- equal and bilateral Secured at: 22 cm Tube secured with: Tape Dental Injury: Teeth and Oropharynx as per pre-operative assessment

## 2013-07-20 NOTE — Anesthesia Postprocedure Evaluation (Signed)
  Anesthesia Post-op Note  Patient: NICKO DAHER  Procedure(s) Performed: Procedure(s): LEFT TOTAL SHOULDER ARTHROPLASTY (Left)  Patient Location: PACU  Anesthesia Type:General and GA combined with regional for post-op pain  Level of Consciousness: awake, alert , oriented and patient cooperative  Airway and Oxygen Therapy: Patient Spontanous Breathing  Post-op Pain: mild  Post-op Assessment: Post-op Vital signs reviewed, Patient's Cardiovascular Status Stable, Respiratory Function Stable, Patent Airway, No signs of Nausea or vomiting and Pain level controlled  Post-op Vital Signs: stable  Complications: No apparent anesthesia complications

## 2013-07-20 NOTE — Transfer of Care (Signed)
Immediate Anesthesia Transfer of Care Note  Patient: Billy Rasmussen  Procedure(s) Performed: Procedure(s): LEFT TOTAL SHOULDER ARTHROPLASTY (Left)  Patient Location: PACU  Anesthesia Type:General  Level of Consciousness: awake, alert  and oriented  Airway & Oxygen Therapy: Patient Spontanous Breathing and Patient connected to nasal cannula oxygen  Post-op Assessment: Report given to PACU RN, Post -op Vital signs reviewed and stable and Patient moving all extremities  Post vital signs: Reviewed and stable  Complications: No apparent anesthesia complications

## 2013-07-20 NOTE — Op Note (Signed)
07/20/2013  11:45 AM  PATIENT:   Billy Rasmussen  68 y.o. male  PRE-OPERATIVE DIAGNOSIS:  OSTEOARTHRITIS LEFT SHOULDER  POST-OPERATIVE DIAGNOSIS:  same  PROCEDURE:  L TSA #14 stem, 48x18 eccentric head, 44 glenoid  SURGEON:  Argil Mahl, Metta Clines M.D.  ASSISTANTS: Shuford pac   ANESTHESIA:   GET + ISB  EBL: 250  SPECIMEN:  none  Drains: hemovac X1   PATIENT DISPOSITION:  PACU - hemodynamically stable.    PLAN OF CARE: Admit to inpatient   Dictation# [id number], [id number]

## 2013-07-21 ENCOUNTER — Encounter (HOSPITAL_COMMUNITY): Payer: Self-pay | Admitting: General Practice

## 2013-07-21 MED ORDER — DIAZEPAM 5 MG PO TABS: 2.5000 mg | ORAL_TABLET | Freq: Four times a day (QID) | ORAL | Status: DC | PRN

## 2013-07-21 MED ORDER — OXYCODONE-ACETAMINOPHEN 5-325 MG PO TABS: 1.0000 | ORAL_TABLET | ORAL | Status: DC | PRN

## 2013-07-21 NOTE — Discharge Summary (Signed)
PATIENT ID:      Billy Rasmussen  MRN:     [id number] DOB/AGE:    10-30-1945 / 68 y.o.     DISCHARGE SUMMARY  ADMISSION DATE:    07/20/2013 DISCHARGE DATE:    ADMISSION DIAGNOSIS: OSTEOARTHRITIS LEFT SHOULDER Past Medical History  Diagnosis Date  . Hypertension   . Anxiety   . Shortness of breath     with exertion  . Arthritis   . Fibromyalgia   . PONV (postoperative nausea and vomiting)     once  arthroscopy  . Depression     denies  . Sleep apnea     does not use cpap    DISCHARGE DIAGNOSIS:   Active Problems:   S/P shoulder replacement   PROCEDURE: Procedure(s): LEFT TOTAL SHOULDER ARTHROPLASTY on 07/20/2013  CONSULTS:   none  HISTORY:  See H&P in chart.  HOSPITAL COURSE:  Billy Rasmussen is a 68 y.o. admitted on 07/20/2013 with a chief complaint of left shoulder pain and dysfunction, and found to have a diagnosis of [place]. They had recovered nicely from their right total shoulder arthroplasty They were brought to the operating room on 07/20/2013 and underwent Procedure(s): LEFT TOTAL SHOULDER ARTHROPLASTY.    They were given perioperative antibiotics: Anti-infectives   Start     Dose/Rate Route Frequency Ordered Stop   07/20/13 1600  ceFAZolin (ANCEF) IVPB 2 g/50 mL premix     2 g 100 mL/hr over 30 Minutes Intravenous Every 6 hours 07/20/13 1410 07/21/13 0415   07/20/13 0600  ceFAZolin (ANCEF) IVPB 2 g/50 mL premix     2 g 100 mL/hr over 30 Minutes Intravenous On call to O.R. 07/19/13 1409 07/20/13 1010    .  Patient underwent the above named procedure and tolerated it well. The following day they were hemodynamically stable and pain was controlled on oral analgesics. They were neurovascularly intact to the operative extremity. OT was ordered and worked with patient per protocol. They were medically and orthopaedically stable for discharge on day 1.  DIAGNOSTIC STUDIES:  RECENT RADIOGRAPHIC STUDIES :  No results found.  RECENT VITAL  SIGNS:  Patient Vitals for the past 24 hrs:  BP Temp Temp src Pulse Resp SpO2  07/21/13 0548 126/66 mmHg 97.4 F (36.3 C) Oral 86 16 96 %  07/21/13 0130 112/56 mmHg 98.2 F (36.8 C) Oral 92 16 96 %  07/20/13 2100 100/56 mmHg 98.9 F (37.2 C) Oral 94 16 94 %  07/20/13 1348 111/68 mmHg 98.3 F (36.8 C) - 90 16 92 %  07/20/13 1300 - - - 80 16 96 %  07/20/13 1255 113/58 mmHg - - 73 13 96 %  07/20/13 1245 - - - 69 13 95 %  07/20/13 1240 119/73 mmHg - - 72 14 96 %  07/20/13 1230 - - - 81 20 95 %  07/20/13 1224 127/78 mmHg - - 79 18 97 %  07/20/13 1215 - - - 71 14 97 %  07/20/13 1210 127/59 mmHg - - 73 12 97 %  07/20/13 1209 - 97.6 F (36.4 C) - - - -  07/20/13 0949 148/72 mmHg - - 81 16 100 %  07/20/13 0948 - - - 80 16 95 %  07/20/13 0947 - - - 75 13 75 %  07/20/13 0946 - - - 77 13 100 %  07/20/13 0945 - - - 77 16 100 %  07/20/13 0944 - - - 74 16 100 %  07/20/13  0943 - - - 78 24 100 %  07/20/13 0942 - - - 74 14 100 %  07/20/13 0941 - - - 69 16 100 %  07/20/13 0940 165/87 mmHg - - 68 16 100 %  07/20/13 0939 - - - 70 16 100 %  07/20/13 0938 - - - 70 18 100 %  07/20/13 0937 - - - 71 18 100 %  07/20/13 0936 - - - 73 22 100 %  07/20/13 0935 149/84 mmHg - - 67 22 100 %  07/20/13 0934 - - - 69 20 100 %  07/20/13 0930 144/76 mmHg - - 69 15 100 %  07/20/13 0925 156/88 mmHg - - 62 14 100 %  07/20/13 0920 158/90 mmHg - - 78 - 100 %  07/20/13 0915 141/77 mmHg - - 67 20 100 %  07/20/13 0910 133/97 mmHg - - 67 21 100 %  07/20/13 0905 133/83 mmHg - - 69 - 100 %  07/20/13 0900 132/72 mmHg - - 70 - 100 %  07/20/13 0855 131/84 mmHg - - 73 - 100 %  07/20/13 0850 137/78 mmHg - - 68 14 100 %  07/20/13 0845 132/75 mmHg - - 64 9 100 %  07/20/13 0840 130/77 mmHg - - 72 12 100 %  .  RECENT EKG RESULTS:    Orders placed during the hospital encounter of 03/28/13  . EKG 12-LEAD  . EKG 12-LEAD    DISCHARGE INSTRUCTIONS:    DISCHARGE MEDICATIONS:     Medication List         acetaminophen  500 MG tablet  Commonly known as:  TYLENOL  Take 500 mg by mouth every 6 (six) hours as needed.     diazepam 5 MG tablet  Commonly known as:  VALIUM  Take 0.5-1 tablets (2.5-5 mg total) by mouth every 6 (six) hours as needed for muscle spasms or sedation.     diclofenac 75 MG EC tablet  Commonly known as:  VOLTAREN  Take 75 mg by mouth 2 (two) times daily.     docusate sodium 100 MG capsule  Commonly known as:  COLACE  Take 100 mg by mouth daily.     lisinopril-hydrochlorothiazide 20-12.5 MG per tablet  Commonly known as:  PRINZIDE,ZESTORETIC  Take 1 tablet by mouth daily.     oxyCODONE-acetaminophen 5-325 MG per tablet  Commonly known as:  PERCOCET  Take 1-2 tablets by mouth every 4 (four) hours as needed.     oxymetazoline 0.05 % nasal spray  Commonly known as:  AFRIN  Place 2 sprays into the nose 2 (two) times daily as needed for congestion.        FOLLOW UP VISIT:       Follow-up Information   Follow up with Marin Shutter, MD. (call to be seen in 10-14 days)    Specialty:  Orthopedic Surgery   Contact information:   [address] [place] [address] [phone]       DISCHARGE TO: home  DISPOSITION: good  DISCHARGE CONDITION:  Billy Rasmussen for Dr. Justice Britain 07/21/2013, 8:37 AM

## 2013-07-21 NOTE — Op Note (Signed)
Billy Rasmussen, Billy Rasmussen NO.:  [id number]  MEDICAL RECORD NO.:  [id number]  LOCATION:  5N01C                        FACILITY:  [facility]  PHYSICIAN:  Metta Clines. Meryl Ponder, M.D.  DATE OF BIRTH:  Apr 07, 1946  DATE OF PROCEDURE:  07/20/2013 DATE OF DISCHARGE:                              OPERATIVE REPORT   PREOPERATIVE DIAGNOSIS:  End-stage left shoulder osteoarthrosis.  POSTOPERATIVE DIAGNOSIS:  End-stage left shoulder osteoarthrosis.  PROCEDURE:  Left total shoulder arthroplasty utilizing a press-fit size 14 [employer] Stem with a 48 x 18 eccentric head and a cemented pegged 44 glenoid.  SURGEON:  Metta Clines. Donelda Mailhot, M.D.  Terrence DupontOlivia Mackie A. Shuford, P.A.-C.  ANESTHESIA:  General endotracheal as well as an interscalene block.  ESTIMATED BLOOD LOSS:  150 mL.  DRAINS:  Hemovac x1.  HISTORY:  Billy Rasmussen is a 68 year old gentleman, who has had progressive increasing pain and function limitations related to bilateral shoulder end-stage osteoarthritis.  He is  status post a right total shoulder arthroplasty that we performed several months ago.  He has done well and then returns to the operating room today for planned left total shoulder arthroplasty.  Preoperatively, I counseled Billy Rasmussen on treatment options as well as risks versus benefits thereof.  Possible surgical complications were all reviewed, including potential for bleeding, infection, neurovascular injury, persistent pain, loss of motion, anesthetic complication, failure of the implant possible need for additional surgery.  He understands and accepts and agrees with our planned procedure.  PROCEDURE IN DETAIL:  After undergoing routine preop evaluation, the patient received prophylactic antibiotics and an interscalene block was established by the Anesthesia Department.  Placed on the operating table, underwent smooth induction of a general endotracheal anesthesia. Placed in beach-chair position and  appropriate padded and protected. The left shoulder girdle region was sterilely prepped and draped in standard fashion.  Time-out was called.  An anterior approach of the left shoulder made through 12 cm deltopectoral incision.  Skin flaps were elevated.  Electrocautery was used for hemostasis.  Dissection carried deeply with cephalic vein identified, retracted lateral deltoid, thick major retracted medially, and the upper cm of thick major was tenotomized to improve exposure and adhesions were then divided beneath the deltoid to allow placement of retractors and the conjoined tendon was then identified, mobilized, and retracted medially.  At this point, the biceps tendon was then unroofed from the bicipital groove, tenotomized for later tenodesis, then we split the rotator cuff along the rotator interval to the base of the coracoid and then divided the subscapularis away from the lesser tuberosity leaving a 1 cm cuff of tissue for later repair.  I tagged the free margin of the subscapularis with a series of grasping #2 FiberWire sutures.  We then divided capsular tissues from anteroinferior, inferior, and posteroinferior allowed delivery of the humeral head to the wound, we carefully protected the rotator cuff superiorly and posteriorly.  At this point, we used extramedullary guide to outline the appropriate humeral head resection, which we performed with an oscillating saw, maintained the native retroversion.  I then performed hand reaming the humeral canal up to size 14.  We then used the 14 broach, and then broached  up to size 14 __________ fixation.  Used a rongeur to remove the osteophytes on the anterior and inferior aspect of the humeral head.  Once this was completed, we placed a metal cap over the cut proximal humeral surface and then exposed the glenoid with combination of Fukuda, pitchfork, and snake tongue retractors.  I performed a circumferential labral resection,  identifying the margin of the glenoid, which showed some mild retroversion.  Then, placed a guide pin in the center of the glenoid and determined the 40-40 at the best fit and reamed with a 44 to a stable subchondral bony bed and noted the bone was markedly sclerotic.  Used a rongeur to remove some residual bone at the margins of the glenoid as well as the soft tissues.  This was completed, we drilled our central PEG hole following by the purple stabilization holes.  The trial pegged glenoid showed excellent fit.  At this point, the glenoid was irrigated, meticulously cleaned and dried.  Cement was mixed in the appropriate consistency. We placed cement into the peripheral peg holes and the final 44 pegged glenoid was impacted into position with excellent fixation.  When cement hardened, we returned our attention to the proximal humerus where we irrigated the canal and then to bone graft harvested from the humeral head and used to bone graft the metaphyseal portion of the humeral shaft and impacted our humeral stem to the appropriate level with excellent fit and fixation maintaining native retroversion.  I then performed a series of trial reductions and ultimately, 48 x 18 eccentric head had the best soft tissue balance. The final 48 x 18 head was impacted into position.  The final reduction was performed.  Identify 50% posterior translation in humeral head and glenoid and overall soft tissue balance.  The final position of the prosthesis was much to our satisfaction.  The joint was then copiously irrigated.  Hemostasis obtained.  The subscapularis was repaired back to the lesser tuberosity using #2 FiberWires.  We closed rotator interval with a pair figure-of-eight #2 FiberWire sutures. The  biceps tendon was then tenodesed at the upper [place] major with a #2 FiberWire.  At this point, we initially placed a Hemovac drain and then closed the deltopectoral interval with a series of interrupted  figure-of-eight #1 Vicryl sutures.  2-0 Vicryl used for the subcu layer and intracuticular 3-0 Monocryl for skin followed by Steri-Strips.  Dry dressings applied. At the closure of the case, the Hemovac drain was inadvertently withdrawn.  The shoulder was then placed into a sling and the patient was subsequently awakened extubated, and taken to recovery room in stable condition.     Metta Clines. Trecia Maring, M.D.     KMS/MEDQ  D:  07/20/2013  T:  07/21/2013  Job:  [id number]

## 2013-07-21 NOTE — Plan of Care (Signed)
Problem: Consults Goal: Rotator Cuff Repair Patient Education See Patient Education Module for education specifics. Outcome: Completed/Met Date Met:  07/21/13 Left Total Shoulder Arthroplasty  Problem: Phase I Progression Outcomes Goal: OT evaluation for ADLs discussed Outcome: Completed/Met Date Met:  07/21/13 ordered Goal: Therapeutic exercise per MD order Outcome: Progressing OT ordered

## 2013-07-21 NOTE — Evaluation (Signed)
Occupational Therapy Evaluation and Discharge Patient Details Name: Billy Rasmussen MRN: [id number] DOB: [birth year] Today's Date: 07/21/2013 Time: 0932-3557 OT Time Calculation (min): 28 min  OT Assessment / Plan / Recommendation History of present illness Left TSA   Clinical Impression   This 68 yo male admitted and underwent above as well as Right TSA in Nov 2014 presents to acute OT with all education completed with pt and wife. Acute OT will sign off.    OT Assessment  Progress rehab of shoulder as ordered by MD at follow-up appointment    Follow Up Recommendations  No OT follow up       Equipment Recommendations  None recommended by OT          Precautions / Restrictions Precautions Precautions: Shoulder Shoulder Interventions: Shoulder sling/immobilizer;At all times;Off for dressing/bathing/exercises Required Braces or Orthoses: Sling Restrictions Weight Bearing Restrictions: Yes LUE Weight Bearing: Non weight bearing   Pertinent Vitals/Pain No C/o    ADL  Transfers/Ambulation Related to ADLs: Independent with all without AD ADL Comments: Wife will A him as she did for last shoulder in Nov 2014     Acute Rehab OT Goals Patient Stated Goal: Home today  Visit Information  Last OT Received On: 07/21/13 Assistance Needed: +1 History of Present Illness: Left TSA       Prior [place] expects to be discharged to:: Private residence Living Arrangements: Spouse/significant other Available Help at Discharge: Family;Available 24 hours/day Type of Home: House Additional Comments: Wife and daughter are CNAs and pt's wife has had a rotator cuff repair in the past Prior Function Level of Independence: Independent Communication Communication: No difficulties Dominant Hand: Right         Vision/Perception Vision - History Patient Visual Report: No change from baseline   Cognition  Cognition Arousal/Alertness:  Awake/alert Behavior During Therapy: WFL for tasks assessed/performed Overall Cognitive Status: Within Functional Limits for tasks assessed    Extremity/Trunk Assessment Upper Extremity Assessment Upper Extremity Assessment: LUE deficits/detail LUE Deficits / Details: here for TSA; elbow distally WNL     Mobility Bed Mobility Overal bed mobility: Modified Independent Transfers Overall transfer level: Independent     Exercise Other Exercises Other Exercises: Instructed in Dr. Susie Cassette TSA protocol for exercises (30,60,90, pendulums PROM) Donning/doffing shirt without moving shoulder: Caregiver independent with task Method for sponge bathing under operated UE: Caregiver independent with task Donning/doffing sling/immobilizer: Caregiver independent with task Correct positioning of sling/immobilizer: Independent Pendulum exercises (written home exercise program): Supervision/safety ROM for elbow, wrist and digits of operated UE: Supervision/safety Sling wearing schedule (on at all times/off for ADL's): Independent Proper positioning of operated UE when showering: Independent Dressing change:  (NA) Positioning of UE while sleeping: Caregiver independent with task      End of Session OT - End of Session Equipment Utilized During Treatment:  (sling) Activity Tolerance: Patient tolerated treatment well Patient left: with family/visitor present (sitting EOB) Nurse Communication:  (Pt ready to go)       Almon Register [phone] 07/21/2013, 10:33 AM

## 2013-07-24 NOTE — Op Note (Signed)
NAMEMarland Kitchen  Billy, Rasmussen NO.:  [id number]  MEDICAL RECORD NO.:  [id number]  LOCATION:                                 FACILITY:  PHYSICIAN:  Metta Clines. Starletta Houchin, M.D.  DATE OF BIRTH:  [birth year]  DATE OF PROCEDURE:  07/20/2013 DATE OF DISCHARGE:  07/20/2013                              OPERATIVE REPORT   ADDENDUM:  Jenetta Loges, PA-C was used as an Environmental consultant throughout this case essential for help with positioning the patient, positioning the extremity, soft tissue retraction, tissue manipulation, implantation of the prosthesis, wound closure, and intraoperative decision making.     Metta Clines. Gregroy Dombkowski, M.D.     KMS/MEDQ  D:  07/20/2013  T:  07/21/2013  Job:  [id number]

## 2013-08-10 ENCOUNTER — Ambulatory Visit: Payer: Medicare Other | Attending: Orthopedic Surgery | Admitting: Physical Therapy

## 2013-08-10 DIAGNOSIS — IMO0001 Reserved for inherently not codable concepts without codable children: Secondary | ICD-10-CM | POA: Insufficient documentation

## 2013-08-10 DIAGNOSIS — M25619 Stiffness of unspecified shoulder, not elsewhere classified: Secondary | ICD-10-CM | POA: Insufficient documentation

## 2013-08-10 DIAGNOSIS — M25519 Pain in unspecified shoulder: Secondary | ICD-10-CM | POA: Insufficient documentation

## 2013-08-10 DIAGNOSIS — R5381 Other malaise: Secondary | ICD-10-CM | POA: Insufficient documentation

## 2013-08-14 ENCOUNTER — Ambulatory Visit: Payer: Medicare Other | Admitting: Physical Therapy

## 2013-08-16 ENCOUNTER — Ambulatory Visit: Payer: Medicare Other | Admitting: Physical Therapy

## 2013-08-21 ENCOUNTER — Ambulatory Visit: Payer: Medicare Other | Admitting: Physical Therapy

## 2013-08-23 ENCOUNTER — Ambulatory Visit: Payer: Medicare Other | Admitting: Physical Therapy

## 2013-08-28 ENCOUNTER — Ambulatory Visit: Payer: Medicare Other | Admitting: Physical Therapy

## 2013-08-30 ENCOUNTER — Ambulatory Visit: Payer: Medicare Other | Attending: Orthopedic Surgery | Admitting: Physical Therapy

## 2013-08-30 DIAGNOSIS — IMO0001 Reserved for inherently not codable concepts without codable children: Secondary | ICD-10-CM | POA: Insufficient documentation

## 2013-08-30 DIAGNOSIS — M25519 Pain in unspecified shoulder: Secondary | ICD-10-CM | POA: Insufficient documentation

## 2013-08-30 DIAGNOSIS — R5381 Other malaise: Secondary | ICD-10-CM | POA: Insufficient documentation

## 2013-08-30 DIAGNOSIS — M25619 Stiffness of unspecified shoulder, not elsewhere classified: Secondary | ICD-10-CM | POA: Insufficient documentation

## 2013-09-05 ENCOUNTER — Ambulatory Visit: Payer: Medicare Other | Admitting: Physical Therapy

## 2013-09-06 ENCOUNTER — Encounter: Payer: Medicare PPO | Admitting: Physical Therapy

## 2013-09-07 ENCOUNTER — Ambulatory Visit: Payer: Medicare Other | Admitting: Physical Therapy

## 2013-09-11 ENCOUNTER — Ambulatory Visit: Payer: Medicare Other | Admitting: Physical Therapy

## 2013-09-13 ENCOUNTER — Ambulatory Visit: Payer: Medicare Other | Admitting: Physical Therapy

## 2013-09-18 ENCOUNTER — Ambulatory Visit: Payer: Medicare Other | Admitting: Physical Therapy

## 2013-09-20 ENCOUNTER — Ambulatory Visit: Payer: Medicare Other | Admitting: Physical Therapy

## 2013-09-25 ENCOUNTER — Ambulatory Visit: Payer: Medicare Other | Admitting: Physical Therapy

## 2013-09-27 ENCOUNTER — Ambulatory Visit: Payer: Medicare Other | Admitting: Physical Therapy

## 2013-10-02 ENCOUNTER — Ambulatory Visit: Payer: Medicare Other | Attending: Orthopedic Surgery | Admitting: Physical Therapy

## 2013-10-02 DIAGNOSIS — M25519 Pain in unspecified shoulder: Secondary | ICD-10-CM | POA: Insufficient documentation

## 2013-10-02 DIAGNOSIS — IMO0001 Reserved for inherently not codable concepts without codable children: Secondary | ICD-10-CM | POA: Insufficient documentation

## 2013-10-02 DIAGNOSIS — M25619 Stiffness of unspecified shoulder, not elsewhere classified: Secondary | ICD-10-CM | POA: Insufficient documentation

## 2013-10-02 DIAGNOSIS — R5381 Other malaise: Secondary | ICD-10-CM | POA: Insufficient documentation

## 2013-10-04 ENCOUNTER — Ambulatory Visit: Payer: Medicare Other | Admitting: Physical Therapy

## 2013-10-10 ENCOUNTER — Ambulatory Visit: Payer: Medicare Other | Admitting: Physical Therapy

## 2013-10-12 ENCOUNTER — Ambulatory Visit: Payer: Medicare Other | Admitting: Physical Therapy

## 2013-10-17 ENCOUNTER — Encounter: Payer: Medicare PPO | Admitting: Physical Therapy

## 2013-10-18 ENCOUNTER — Encounter: Payer: Medicare PPO | Admitting: Physical Therapy

## 2013-11-14 ENCOUNTER — Other Ambulatory Visit: Payer: Self-pay | Admitting: Urology

## 2013-11-24 ENCOUNTER — Encounter (HOSPITAL_BASED_OUTPATIENT_CLINIC_OR_DEPARTMENT_OTHER): Payer: Self-pay | Admitting: *Deleted

## 2013-11-27 ENCOUNTER — Encounter (HOSPITAL_BASED_OUTPATIENT_CLINIC_OR_DEPARTMENT_OTHER): Payer: Self-pay | Admitting: *Deleted

## 2013-11-27 NOTE — Progress Notes (Signed)
NPO AFTER MN. ARRIVE AT 0600. NEEDS ISTAT. CURRENT EKG IN CHART AND EPIC.

## 2013-12-04 NOTE — H&P (Signed)
History of Present Illness F/u - PCP is Dr. Levada Dy.     1-urinary frequency-May 2015-for about the past year patient has had urinary frequency and urgency. He has occasional weak stream usually and adequate flow. He does empty. He's had no gross hematuria or dysuria. He does describe an "ache" at the end of his penis sometimes when he voids. He has a history of BPH but has been on surveillance. He denies a history of kidney stones. He has not tried anything for his symptoms.    PVR normal.    2-microscopic hematuria-May 2015- UA with MH. He is a long-time smoker and quit in 1999. His also had some chemical and dye exposure in his work. He has had no chemotherapy or radiation exposure. Again no history kidney stones. He's had no dysuria or gross hematuria.  -June 2015 CT hematuria benign, 37 g prostate, heavy calcifications in the prostate.     3-PCa screening -  -May 2015 - normal DRE, small prostate; PSA 0.44      June 2015 interval hx    He returns and continues to have bothersome frequency.   Past Medical History Problems  1. History of arthritis (V13.4) 2. History of diverticulitis of colon (V12.79) 3. History of gastric ulcer (V12.79) 4. History of hypertension (V12.59) 5. History of sleep apnea (V13.89)  Surgical History Problems  1. History of Arthroscopy Knee Left 2. History of Colostomy 3. History of Knee Surgery Left 4. History of Shoulder Surgery  Current Meds 1. Diclofenac Sodium 75 MG Oral Tablet Delayed Release;  Therapy: (Recorded:22May2015) to Recorded 2. Lisinopril-Hydrochlorothiazide 20-12.5 MG Oral Tablet;  Therapy: (Recorded:22May2015) to Recorded 3. Tylenol TABS;  Therapy: (Recorded:22May2015) to Recorded  Allergies Medication  1. Codeine Derivatives  Family History Problems  1. Family history of Alzheimer's disease (V17.2) : Mother 2. Family history of cardiac disorder (V17.49) : Father 3. Family history of congestive heart  failure (V17.49) : Brother  Social History Problems  1. Denied: History of Alcohol use 2. Caffeine use (V49.89)   2 cups of coffee 3. Former smoker (V15.82) 4. Married 5. Number of children   2 daughters 6. Retired  Review of Systems Genitourinary, constitutional, skin, eye, otolaryngeal, hematologic/lymphatic, cardiovascular, pulmonary, endocrine, musculoskeletal, gastrointestinal, neurological and psychiatric system(s) were reviewed and pertinent findings if present are noted.    Vitals Vital Signs [Data Includes: Last 1 Day]  Recorded: [id number] 11:05AM  Blood Pressure: 149 / 90 Temperature: 97.5 F Heart Rate: 62  Physical Exam Constitutional: Well nourished and well developed . No acute distress.  Pulmonary: No respiratory distress and normal respiratory rhythm and effort.  Cardiovascular: Heart rate and rhythm are normal . No peripheral edema.  Neuro/Psych:. Mood and affect are appropriate.    Results/Data Urine [Data Includes: Last 1 Day]   [id number]  COLOR YELLOW   APPEARANCE CLEAR   SPECIFIC GRAVITY <1.005   pH 6.0   GLUCOSE NEG mg/dL  BILIRUBIN NEG   KETONE NEG mg/dL  BLOOD TRACE   PROTEIN NEG mg/dL  UROBILINOGEN 0.2 mg/dL  NITRITE NEG   LEUKOCYTE ESTERASE NEG   SQUAMOUS EPITHELIAL/HPF RARE   WBC 0-2 WBC/hpf  RBC 0-2 RBC/hpf  BACTERIA NONE SEEN   CRYSTALS NONE SEEN   CASTS NONE SEEN    Procedure  Procedure: Cystoscopy   Indication: Hematuria. Lower Urinary Tract Symptoms.  Informed Consent: Risks, benefits, and potential adverse events were discussed and informed consent was obtained from the patient.  Prep: The patient was prepped with betadine.  Antibiotic prophylaxis: Ciprofloxacin.  Procedure Note:  Urethral meatus:. No abnormalities.  Anterior urethra: No abnormalities.  Prostatic urethra: No abnormalities.  Bladder: Visulization was clear. The ureteral orifices were in the normal anatomic position bilaterally and had clear efflux of  urine. Examination of the bladder demonstrated erythematous mucosa located near the dome of the bladder . A sessile tumor was seen in the bladder. This tumor was located on the left side, on the posterior aspect of the bladder. This area the mucosa looked thickened and almost a cobblestone appearance. The patient tolerated the procedure well.  Complications: None.    Assessment Assessed  1. Increased urinary frequency (788.41) 2. Microhematuria (599.72) 3. Neoplasm, bladder (239.4)  Plan Health Maintenance  1. UA With REFLEX; [Do Not Release]; Status:Resulted - Requires Verification;   Done:  [id number] 10:49AM Neoplasm, bladder  2. Follow-up Schedule Surgery Office  Follow-up  Status: Hold For - Appointment   Requested for: [phone] 3. URINE CYTOLOGY; Status:Hold For - Specimen/Data Collection,Appointment;  Requested [id number];   Discussion/Summary    Bladder neoplasm-I discussed the abnormal findings on the cystoscopy and recommended cystoscopy with bladder biopsy and fulguration in the operating room. We discussed the nature risks benefits and alternatives to cystoscopy with bladder biopsy and fulguration. He does not have a single solitary papillary tumor, I did not recommend mitomycin. Is sent urine for cytology.     Frequency - trial of Vesicare 5 mg. he does auctioneering and radio, so I told him to hold off on the trial until a time when he has none of these obligations. I was worried he might get dry mouth and have trouble talking.       Signatures Electronically signed by : Festus Aloe, M.D.; Nov 09 2013 11:50AM EST

## 2013-12-05 ENCOUNTER — Encounter (HOSPITAL_BASED_OUTPATIENT_CLINIC_OR_DEPARTMENT_OTHER)
Admission: RE | Disposition: A | Discharge status: Home / Self Care | Payer: Self-pay | Source: Ambulatory Visit | Attending: Urology

## 2013-12-05 ENCOUNTER — Encounter (HOSPITAL_BASED_OUTPATIENT_CLINIC_OR_DEPARTMENT_OTHER): Payer: Medicare Other | Admitting: Anesthesiology

## 2013-12-05 ENCOUNTER — Ambulatory Visit (HOSPITAL_BASED_OUTPATIENT_CLINIC_OR_DEPARTMENT_OTHER): Payer: Medicare Other | Admitting: Anesthesiology

## 2013-12-05 ENCOUNTER — Encounter (HOSPITAL_BASED_OUTPATIENT_CLINIC_OR_DEPARTMENT_OTHER): Payer: Self-pay

## 2013-12-05 ENCOUNTER — Ambulatory Visit (HOSPITAL_BASED_OUTPATIENT_CLINIC_OR_DEPARTMENT_OTHER)
Admission: RE | Admit: 2013-12-05 | Discharge: 2013-12-05 | Disposition: A | Discharge status: Home / Self Care | Payer: Medicare Other | Source: Ambulatory Visit | Attending: Urology | Admitting: Urology

## 2013-12-05 DIAGNOSIS — G4730 Sleep apnea, unspecified: Secondary | ICD-10-CM | POA: Diagnosis not present

## 2013-12-05 DIAGNOSIS — R350 Frequency of micturition: Secondary | ICD-10-CM | POA: Insufficient documentation

## 2013-12-05 DIAGNOSIS — M129 Arthropathy, unspecified: Secondary | ICD-10-CM | POA: Insufficient documentation

## 2013-12-05 DIAGNOSIS — C679 Malignant neoplasm of bladder, unspecified: Secondary | ICD-10-CM | POA: Diagnosis not present

## 2013-12-05 DIAGNOSIS — R3129 Other microscopic hematuria: Secondary | ICD-10-CM | POA: Insufficient documentation

## 2013-12-05 DIAGNOSIS — Z6836 Body mass index (BMI) 36.0-36.9, adult: Secondary | ICD-10-CM | POA: Insufficient documentation

## 2013-12-05 DIAGNOSIS — R3915 Urgency of urination: Secondary | ICD-10-CM | POA: Insufficient documentation

## 2013-12-05 DIAGNOSIS — I10 Essential (primary) hypertension: Secondary | ICD-10-CM | POA: Diagnosis not present

## 2013-12-05 DIAGNOSIS — N138 Other obstructive and reflux uropathy: Secondary | ICD-10-CM | POA: Insufficient documentation

## 2013-12-05 DIAGNOSIS — F411 Generalized anxiety disorder: Secondary | ICD-10-CM | POA: Insufficient documentation

## 2013-12-05 DIAGNOSIS — Z79899 Other long term (current) drug therapy: Secondary | ICD-10-CM | POA: Insufficient documentation

## 2013-12-05 DIAGNOSIS — N401 Enlarged prostate with lower urinary tract symptoms: Secondary | ICD-10-CM | POA: Insufficient documentation

## 2013-12-05 DIAGNOSIS — Z87891 Personal history of nicotine dependence: Secondary | ICD-10-CM | POA: Insufficient documentation

## 2013-12-05 HISTORY — DX: Obstructive sleep apnea (adult) (pediatric): G47.33

## 2013-12-05 HISTORY — PX: CYSTOSCOPY WITH BIOPSY: SHX5122

## 2013-12-05 HISTORY — DX: Left bundle-branch block, unspecified: I44.7

## 2013-12-05 HISTORY — DX: Personal history of other diseases of the digestive system: Z87.19

## 2013-12-05 HISTORY — DX: Personal history of peptic ulcer disease: Z87.11

## 2013-12-05 HISTORY — PX: TRANSURETHRAL RESECTION OF BLADDER TUMOR WITH GYRUS (TURBT-GYRUS): SHX6458

## 2013-12-05 HISTORY — DX: Presence of spectacles and contact lenses: Z97.3

## 2013-12-05 HISTORY — DX: Diverticulosis of large intestine without perforation or abscess without bleeding: K57.30

## 2013-12-05 LAB — POCT I-STAT 4, (NA,K, GLUC, HGB,HCT)
Glucose, Bld: 99 mg/dL (ref 70–99)
HCT: 48 % (ref 39.0–52.0)
Hemoglobin: 16.3 g/dL (ref 13.0–17.0)
Potassium: 3.8 mEq/L (ref 3.7–5.3)
Sodium: 137 mEq/L (ref 137–147)

## 2013-12-05 SURGERY — CYSTOSCOPY, WITH BIOPSY
Anesthesia: General | Site: Bladder

## 2013-12-05 MED ORDER — SCOPOLAMINE 1 MG/3DAYS TD PT72
MEDICATED_PATCH | TRANSDERMAL | Status: AC
  Filled 2013-12-05: qty 1

## 2013-12-05 MED ORDER — LACTATED RINGERS IV SOLN
INTRAVENOUS | Status: DC
  Filled 2013-12-05: qty 1000

## 2013-12-05 MED ORDER — OXYBUTYNIN CHLORIDE ER 10 MG PO TB24
ORAL_TABLET | ORAL | Status: AC
  Filled 2013-12-05: qty 1

## 2013-12-05 MED ORDER — OXYBUTYNIN CHLORIDE 5 MG PO TABS
5.0000 mg | ORAL_TABLET | Freq: Three times a day (TID) | ORAL | Status: DC
  Administered 2013-12-05: 5 mg via ORAL
  Filled 2013-12-05: qty 1

## 2013-12-05 MED ORDER — DEXAMETHASONE SODIUM PHOSPHATE 4 MG/ML IJ SOLN
INTRAMUSCULAR | Status: DC | PRN
  Administered 2013-12-05: 8 mg via INTRAVENOUS

## 2013-12-05 MED ORDER — ACETAMINOPHEN 10 MG/ML IV SOLN
INTRAVENOUS | Status: DC | PRN
  Administered 2013-12-05: 1000 mg via INTRAVENOUS

## 2013-12-05 MED ORDER — PROMETHAZINE HCL 25 MG/ML IJ SOLN
6.2500 mg | INTRAMUSCULAR | Status: DC | PRN
  Filled 2013-12-05: qty 1

## 2013-12-05 MED ORDER — CEFAZOLIN SODIUM-DEXTROSE 2-3 GM-% IV SOLR
2.0000 g | INTRAVENOUS | Status: AC
  Administered 2013-12-05: 2 g via INTRAVENOUS
  Filled 2013-12-05: qty 50

## 2013-12-05 MED ORDER — KETOROLAC TROMETHAMINE 30 MG/ML IJ SOLN
INTRAMUSCULAR | Status: DC | PRN
  Administered 2013-12-05: 30 mg via INTRAVENOUS

## 2013-12-05 MED ORDER — OXYCODONE HCL 5 MG PO TABS
ORAL_TABLET | ORAL | Status: AC
  Filled 2013-12-05: qty 1

## 2013-12-05 MED ORDER — CEPHALEXIN 500 MG PO CAPS: 500.0000 mg | ORAL_CAPSULE | Freq: Every day | ORAL | Status: DC

## 2013-12-05 MED ORDER — HYDROMORPHONE HCL PF 1 MG/ML IJ SOLN
0.2500 mg | INTRAMUSCULAR | Status: DC | PRN
  Filled 2013-12-05: qty 1

## 2013-12-05 MED ORDER — STERILE WATER FOR IRRIGATION IR SOLN
Status: DC | PRN
  Administered 2013-12-05: 6000 mL

## 2013-12-05 MED ORDER — SCOPOLAMINE 1 MG/3DAYS TD PT72
MEDICATED_PATCH | TRANSDERMAL | Status: DC | PRN
  Administered 2013-12-05: 1 via TRANSDERMAL

## 2013-12-05 MED ORDER — OXYCODONE HCL 5 MG PO TABS
5.0000 mg | ORAL_TABLET | ORAL | Status: DC | PRN
  Administered 2013-12-05: 5 mg via ORAL
  Filled 2013-12-05: qty 1

## 2013-12-05 MED ORDER — OXYCODONE-ACETAMINOPHEN 5-325 MG PO TABS: 1.0000 | ORAL_TABLET | ORAL | Status: DC | PRN

## 2013-12-05 MED ORDER — SODIUM CHLORIDE 0.9 % IR SOLN
Status: DC | PRN
  Administered 2013-12-05: 6000 mL

## 2013-12-05 MED ORDER — CEFAZOLIN SODIUM 1-5 GM-% IV SOLN
1.0000 g | INTRAVENOUS | Status: DC
  Filled 2013-12-05: qty 50

## 2013-12-05 MED ORDER — PROPOFOL 10 MG/ML IV BOLUS
INTRAVENOUS | Status: DC | PRN
Start: 2013-12-05 — End: 2013-12-05
  Administered 2013-12-05 (×2): 30 mg via INTRAVENOUS
  Administered 2013-12-05: 200 mg via INTRAVENOUS
  Administered 2013-12-05: 50 mg via INTRAVENOUS

## 2013-12-05 MED ORDER — FENTANYL CITRATE 0.05 MG/ML IJ SOLN
INTRAMUSCULAR | Status: AC
  Filled 2013-12-05: qty 4

## 2013-12-05 MED ORDER — ONDANSETRON HCL 4 MG/2ML IJ SOLN
INTRAMUSCULAR | Status: DC | PRN
  Administered 2013-12-05: 4 mg via INTRAVENOUS

## 2013-12-05 MED ORDER — LIDOCAINE HCL (CARDIAC) 20 MG/ML IV SOLN
INTRAVENOUS | Status: DC | PRN
  Administered 2013-12-05: 100 mg via INTRAVENOUS

## 2013-12-05 MED ORDER — BELLADONNA ALKALOIDS-OPIUM 16.2-60 MG RE SUPP
RECTAL | Status: AC
  Filled 2013-12-05: qty 1

## 2013-12-05 MED ORDER — LACTATED RINGERS IV SOLN
INTRAVENOUS | Status: DC
  Administered 2013-12-05 (×2): via INTRAVENOUS
  Filled 2013-12-05: qty 1000

## 2013-12-05 MED ORDER — FENTANYL CITRATE 0.05 MG/ML IJ SOLN
INTRAMUSCULAR | Status: DC | PRN
  Administered 2013-12-05 (×3): 25 ug via INTRAVENOUS
  Administered 2013-12-05: 50 ug via INTRAVENOUS
  Administered 2013-12-05 (×3): 25 ug via INTRAVENOUS

## 2013-12-05 MED ORDER — OXYBUTYNIN CHLORIDE 5 MG PO TABS: 5.0000 mg | ORAL_TABLET | Freq: Three times a day (TID) | ORAL | Status: DC | PRN

## 2013-12-05 SURGICAL SUPPLY — 24 items
BAG DRAIN URO-CYSTO SKYTR STRL (DRAIN) ×3 IMPLANT
BAG URINE DRAINAGE (UROLOGICAL SUPPLIES) ×3 IMPLANT
CANISTER SUCT LVC 12 LTR MEDI- (MISCELLANEOUS) ×3 IMPLANT
CATH COUDE FOLEY 2W 5CC 18FR (CATHETERS) ×3 IMPLANT
CLOTH BEACON ORANGE TIMEOUT ST (SAFETY) ×3 IMPLANT
DRAPE CAMERA CLOSED 9X96 (DRAPES) ×3 IMPLANT
ELECT LOOP MED HF 24F 12D CBL (CLIP) ×3 IMPLANT
ELECT REM PT RETURN 9FT ADLT (ELECTROSURGICAL) ×3
ELECTRODE REM PT RTRN 9FT ADLT (ELECTROSURGICAL) ×1 IMPLANT
GLOVE BIO SURGEON STRL SZ7.5 (GLOVE) ×6 IMPLANT
GLOVE BIOGEL PI IND STRL 6.5 (GLOVE) ×2 IMPLANT
GLOVE BIOGEL PI INDICATOR 6.5 (GLOVE) ×4
GLOVE SURG SS PI 6.0 STRL IVOR (GLOVE) ×3 IMPLANT
GOWN STRL REIN XL XLG (GOWN DISPOSABLE) IMPLANT
GOWN STRL REUS W/TWL LRG LVL3 (GOWN DISPOSABLE) ×3 IMPLANT
GOWN STRL REUS W/TWL XL LVL3 (GOWN DISPOSABLE) ×3 IMPLANT
HOLDER FOLEY CATH W/STRAP (MISCELLANEOUS) ×3 IMPLANT
IV NS IRRIG 3000ML ARTHROMATIC (IV SOLUTION) ×6 IMPLANT
MARKER SKIN DUAL TIP RULER LAB (MISCELLANEOUS) ×3 IMPLANT
NEEDLE HYPO 22GX1.5 SAFETY (NEEDLE) IMPLANT
NS IRRIG 500ML POUR BTL (IV SOLUTION) IMPLANT
PACK CYSTOSCOPY (CUSTOM PROCEDURE TRAY) ×3 IMPLANT
WATER STERILE IRR 3000ML UROMA (IV SOLUTION) ×6 IMPLANT
WATER STERILE IRR 500ML POUR (IV SOLUTION) ×3 IMPLANT

## 2013-12-05 NOTE — Anesthesia Postprocedure Evaluation (Signed)
Anesthesia Post Note  Patient: Billy Rasmussen  Procedure(s) Performed: Procedure(s) (LRB): CYSTO BLADDER BIOPSY AND FULGERATION (N/A) TRANSURETHRAL RESECTION OF BLADDER TUMOR WITH GYRUS (TURBT-GYRUS) (N/A)  Anesthesia type: General  Patient location: PACU  Post pain: Pain level controlled  Post assessment: Post-op Vital signs reviewed  Last Vitals:  Filed Vitals:   12/05/13 1100  BP: 143/88  Pulse: 73  Temp: 36.8 C  Resp: 17    Post vital signs: Reviewed  Level of consciousness: sedated  Complications: No apparent anesthesia complications

## 2013-12-05 NOTE — Anesthesia Procedure Notes (Signed)
Procedure Name: LMA Insertion Date/Time: 12/05/2013 7:42 AM Performed by: Mechele Claude Pre-anesthesia Checklist: Patient identified, Emergency Drugs available, Suction available and Patient being monitored Patient Re-evaluated:Patient Re-evaluated prior to inductionOxygen Delivery Method: Circle System Utilized Preoxygenation: Pre-oxygenation with 100% oxygen Intubation Type: IV induction Ventilation: Mask ventilation without difficulty LMA: LMA inserted LMA Size: 5.0 Number of attempts: 1 Airway Equipment and Method: bite block Placement Confirmation: positive ETCO2 Tube secured with: Tape Dental Injury: Teeth and Oropharynx as per pre-operative assessment

## 2013-12-05 NOTE — Transfer of Care (Signed)
Immediate Anesthesia Transfer of Care Note  Patient: Billy Rasmussen  Procedure(s) Performed: Procedure(s) (LRB): CYSTO BLADDER BIOPSY AND FULGERATION (N/A) TRANSURETHRAL RESECTION OF BLADDER TUMOR WITH GYRUS (TURBT-GYRUS) (N/A)  Patient Location: PACU  Anesthesia Type: General  Level of Consciousness: awake, alert  and oriented  Airway & Oxygen Therapy: Patient Spontanous Breathing and Patient connected to nasal cannula oxygen  Post-op Assessment: Report given to PACU RN and Post -op Vital signs reviewed and stable  Post vital signs: Reviewed and stable  Complications: No apparent anesthesia complications

## 2013-12-05 NOTE — Op Note (Signed)
Preoperative diagnosis: Irritative voiding symptoms, microscopic hematuria, bladder neoplasm Postoperative diagnosis: Same  Procedure: Exam under anesthesia Cystoscopy bladder biopsy fulguration TURBT 2-5 cm  Surgeon: Junious Silk  Type of anesthesia: Gen.  Findings: On exam under anesthesia the penis was normal without mass or lesion. Testicles were descended bilaterally and palpably normal. On digital rectal exam the prostate was mildly enlarged but felt benign. There were no palpable bladder masses on bimanual exam.  Cystoscopy revealed a normal urethra and mild BPH. There was a large caliber soft stricture of the bulbar urethra near the membranous urethra. This was easily passed with the scope.  In the bladder there were several concerning areas labeled as follows: #1 small papillary tumor cluster left posterior above the UL #2 flat erythematous mucosa posterior bladder wall inferior #3 flat erythematous mucosa posterior bladder wall superior #4 the left bladder neck situated between 3 to 5 o clock, more papillary appearance covering 2-5 cm #5 dome toward the left small area erythematous mucosa  #1, 2, 3 and 5 were sampled with cold cup biopsy and fulguration #4 at the left bladder neck required TURBT 2-5 cm due to the close proximity to the bladder neck. I cannot see the tumor with the cold cup as the tip extended too far from the lens and once I approached the tissue the lens was within the prostatic urethra and the target could no longer be seen.   Description of procedure: After consent was obtained patient brought to the operating room. After adequate anesthesia placed in was placed in lithotomy position and prepped and draped in the usual sterile fashion. A timeout was performed to confirm the patient and procedure. An exam under anesthesia was performed. A cystoscope was passed per urethra and the bladder carefully examined with a 12 and 70 lens.   The cold cup biopsy forceps  were used to sample #1, #2, #3. These areas were fulgurated. I switched to the gyrus and loop and then resected the left bladder neck tumor for #4. I then placed the cold cup again and biopsied the dome for #5. This was fulgurated with the loop. There was excellent hemostasis at low-pressure. There was equal return of fluid throughout the case and I did not suspect any perforations although the posterior superior, dome and left bladder neck appeared to involve some muscle.   The ureteral orifices appeared normal and were not injured. There were normal on inspection. The bladder was refilled and the scope removed. An 43 French coud catheter was placed without difficulty. It was left to gravity drainage with clear urine.   The patient was awakened taken to cover him in stable condition.   Complications: None  Blood loss: Minimal   Drains: 80 French coud catheter   Specimens to pathology:  #1 small papillary tumor cluster left posterior above the UL #2 flat erythematous mucosa posterior bladder wall inferior #3 flat erythematous mucosa posterior bladder wall superior #4 the left bladder neck situated between 3 to 5 o clock, more papillary appearance covering 2-5 cm #5 dome toward the left small area erythematous mucosa

## 2013-12-05 NOTE — Interval H&P Note (Signed)
History and Physical Interval Note:  12/05/2013 7:33 AM  [name]  has presented today for surgery, with the diagnosis of BLADDER NEOPLASMA AND URINARY FREQUENCY  The various methods of treatment have been discussed with the patient and family. After consideration of risks, benefits and other options for treatment, the patient has consented to  Procedure(s): [place] (N/A) as a surgical intervention .  The patient's history has been reviewed, patient examined, no change in status, stable for surgery.  I have reviewed the patient's chart and labs. Pt has been well. Discussed urine cytology suspicious for bladder cancer. Discussed poss bx vs TURBT and need for restaging in future. We discussed side effects of the proposed treatment, the likelihood of the patient achieving the goals of the procedure, and any potential problems that might occur during the procedure or recuperation. Possible need for catheter. All questions answered. Patient elects to proceed. He complains of occasional urgency and intermittent stream. No dysuria or hematuria. No fever.     Billy Rasmussen

## 2013-12-05 NOTE — Anesthesia Preprocedure Evaluation (Addendum)
Anesthesia Evaluation  Patient identified by MRN, date of birth, ID band Patient awake    Reviewed: Allergy & Precautions, H&P , NPO status , Patient's Chart, lab work & pertinent test results  History of Anesthesia Complications (+) PONV  Airway Mallampati: II TM Distance: >3 FB Neck ROM: Full    Dental  (+) Partial Lower, Partial Upper, Dental Advisory Given   Pulmonary sleep apnea and Oxygen sleep apnea , former smoker,  breath sounds clear to auscultation  Pulmonary exam normal       Cardiovascular hypertension, Pt. on medications Rhythm:Regular Rate:Normal     Neuro/Psych Anxiety  Neuromuscular disease    GI/Hepatic   Endo/Other  Morbid obesity  Renal/GU      Musculoskeletal  (+) Arthritis -,   Abdominal   Peds  Hematology   Anesthesia Other Findings   Reproductive/Obstetrics                         Anesthesia Physical Anesthesia Plan  ASA: II  Anesthesia Plan: General   Post-op Pain Management:    Induction: Intravenous  Airway Management Planned: LMA  Additional Equipment:   Intra-op Plan:   Post-operative Plan: Extubation in OR  Informed Consent: I have reviewed the patients History and Physical, chart, labs and discussed the procedure including the risks, benefits and alternatives for the proposed anesthesia with the patient or authorized representative who has indicated his/her understanding and acceptance.   Dental advisory given  Plan Discussed with: CRNA  Anesthesia Plan Comments:         Anesthesia Quick Evaluation

## 2013-12-05 NOTE — Discharge Instructions (Signed)
Foley Catheter Care, Adult A Foley catheter is a soft, flexible tube that is placed into the bladder to drain urine. A Foley catheter may be inserted if:  You leak urine or are not able to control when you urinate (urinary incontinence).  You are not able to urinate when you need to (urinary retention).  You had prostate surgery or surgery on the genitals.  You have certain medical conditions, such as multiple sclerosis, dementia, or a spinal cord injury. If you are going home with a Foley catheter in place, follow the instructions below. TAKING CARE OF THE CATHETER 1. Wash your hands with soap and water. 2. Using mild soap and warm water on a clean washcloth:  Clean the area on your body closest to the catheter insertion site using a circular motion, moving away from the catheter. Never wipe toward the catheter because this could sweep bacteria up into the urethra and cause infection.  Remove all traces of soap. Pat the area dry with a clean towel. For males, reposition the foreskin. 3. Attach the catheter to your leg so there is no tension on the catheter. Use adhesive tape or a leg strap. If you are using adhesive tape, remove any sticky residue left behind by the previous tape you used. 4. Keep the drainage bag below the level of the bladder, but keep it off the floor. 5. Check throughout the day to be sure the catheter is working and urine is draining freely. Make sure the tubing does not become kinked. 6. Do not pull on the catheter or try to remove it. Pulling could damage internal tissues. TAKING CARE OF THE DRAINAGE BAGS You will be given two drainage bags to take home. One is a large overnight drainage bag, and the other is a smaller leg bag that fits underneath clothing. You may wear the overnight bag at any time, but you should never wear the smaller leg bag at night. Follow the instructions below for how to empty, change, and clean your drainage bags. Emptying the Drainage  Bag You must empty your drainage bag when it is  - full or at least 2-3 times a day. 1. Wash your hands with soap and water. 2. Keep the drainage bag below your hips, below the level of your bladder. This stops urine from going back into the tubing and into your bladder. 3. Hold the dirty bag over the toilet or a clean container. 4. Open the pour spout at the bottom of the bag and empty the urine into the toilet or container. Do not let the pour spout touch the toilet, container, or any other surface. Doing so can place bacteria on the bag, which can cause an infection. 5. Clean the pour spout with a gauze pad or cotton ball that has rubbing alcohol on it. 6. Close the pour spout. 7. Attach the bag to your leg with adhesive tape or a leg strap. 8. Wash your hands well. Changing the Drainage Bag Change your drainage bag once a month or sooner if it starts to smell bad or look dirty. Below are steps to follow when changing the drainage bag. 1. Wash your hands with soap and water. 2. Pinch off the rubber catheter so that urine does not spill out. 3. Disconnect the catheter tube from the drainage tube at the connection valve. Do not let the tubes touch any surface. 4. Clean the end of the catheter tube with an alcohol wipe. Use a different alcohol wipe to clean  the end of the drainage tube. 5. Connect the catheter tube to the drainage tube of the clean drainage bag. 6. Attach the new bag to the leg with adhesive tape or a leg strap. Avoid attaching the new bag too tightly. 7. Wash your hands well. Cleaning the Drainage Bag 1. Wash your hands with soap and water. 2. Wash the bag in warm, soapy water. 3. Rinse the bag thoroughly with warm water. 4. Fill the bag with a solution of white vinegar and water (1 cup vinegar to 1 qt warm water [.2 L vinegar to 1 L warm water]). Close the bag and soak it for 30 minutes in the solution. 5. Rinse the bag with warm water. 6. Hang the bag to dry with the  pour spout open and hanging downward. 7. Store the clean bag (once it is dry) in a clean plastic bag. 8. Wash your hands well. PREVENTING INFECTION  Wash your hands before and after handling your catheter.  Take showers daily and wash the area where the catheter enters your body. Do not take baths. Replace wet leg straps with dry ones, if this applies.  Do not use powders, sprays, or lotions on the genital area. Only use creams, lotions, or ointments as directed by your caregiver.  For females, wipe from front to back after each bowel movement.  Drink enough fluids to keep your urine clear or pale yellow unless you have a fluid restriction.  Do not let the drainage bag or tubing touch or lie on the floor.  Wear cotton underwear to absorb moisture and to keep your skin drier. SEEK MEDICAL CARE IF:   Your urine is cloudy or smells unusually bad.  Your catheter becomes clogged.  You are not draining urine into the bag or your bladder feels full.  Your catheter starts to leak. SEEK IMMEDIATE MEDICAL CARE IF:   You have pain, swelling, redness, or pus where the catheter enters the body.  You have pain in the abdomen, legs, lower back, or bladder.  You have a fever.  You see blood fill the catheter, or your urine is pink or red.  You have nausea, vomiting, or chills.  Your catheter gets pulled out. MAKE SURE YOU:   Understand these instructions.  Will watch your condition.  Will get help right away if you are not doing well or get worse. Document Released: 05/18/2005 Document Revised: 09/12/2012 Document Reviewed: 05/09/2012 [facility] Patient Information 2015 [place], [place]. This information is not intended to replace advice given to you by your health care provider. Make sure you discuss any questions you have with your health care provider.  Cystoscopy, Care After Refer to this sheet in the next few weeks. These instructions provide you with information on caring for  yourself after your procedure. Your caregiver may also give you more specific instructions. Your treatment has been planned according to current medical practices, but problems sometimes occur. Call your caregiver if you have any problems or questions after your procedure. HOME CARE INSTRUCTIONS  Things you can do to ease any discomfort after your procedure include:  Drinking enough water and fluids to keep your urine clear or pale yellow.  Taking a warm bath to relieve any burning feelings. SEEK IMMEDIATE MEDICAL CARE IF:   You have an increase in blood in your urine.  You notice blood clots in your urine.  You have difficulty passing urine.  You have the chills.  You have abdominal pain.  You have a fever  or persistent symptoms for more than 2-3 days.  You have a fever and your symptoms suddenly get worse. MAKE SURE YOU:   Understand these instructions.  Will watch your condition.  Will get help right away if you are not doing well or get worse. Document Released: 12/05/2004 Document Revised: 01/18/2013 Document Reviewed: 11/09/2011 [facility] Patient Information 2015 [place], [place]. This information is not intended to replace advice given to you by your health care provider. Make sure you discuss any questions you have with your health care provider.   Post Anesthesia Home Care Instructions  Activity: Get plenty of rest for the remainder of the day. A responsible adult should stay with you for 24 hours following the procedure.  For the next 24 hours, DO NOT: -Drive a car -Paediatric nurse -Drink alcoholic beverages -Take any medication unless instructed by your physician -Make any legal decisions or sign important papers.  Meals: Start with liquid foods such as gelatin or soup. Progress to regular foods as tolerated. Avoid greasy, spicy, heavy foods. If nausea and/or vomiting occur, drink only clear liquids until the nausea and/or vomiting subsides. Call your physician  if vomiting continues.  Special Instructions/Symptoms: Your throat may feel dry or sore from the anesthesia or the breathing tube placed in your throat during surgery. If this causes discomfort, gargle with warm salt water. The discomfort should disappear within 24 hours.

## 2013-12-06 ENCOUNTER — Encounter (HOSPITAL_BASED_OUTPATIENT_CLINIC_OR_DEPARTMENT_OTHER): Payer: Self-pay | Admitting: Urology

## 2014-07-18 DIAGNOSIS — C678 Malignant neoplasm of overlapping sites of bladder: Secondary | ICD-10-CM | POA: Diagnosis not present

## 2014-07-18 DIAGNOSIS — R350 Frequency of micturition: Secondary | ICD-10-CM | POA: Diagnosis not present

## 2014-07-18 DIAGNOSIS — N401 Enlarged prostate with lower urinary tract symptoms: Secondary | ICD-10-CM | POA: Diagnosis not present

## 2014-10-25 DIAGNOSIS — C678 Malignant neoplasm of overlapping sites of bladder: Secondary | ICD-10-CM | POA: Diagnosis not present

## 2014-10-31 ENCOUNTER — Other Ambulatory Visit: Payer: Self-pay | Admitting: Urology

## 2014-11-05 ENCOUNTER — Encounter (HOSPITAL_BASED_OUTPATIENT_CLINIC_OR_DEPARTMENT_OTHER): Payer: Self-pay | Admitting: *Deleted

## 2014-11-05 NOTE — Progress Notes (Signed)
Pt instructed npo pmn 6/13 .  To [facility] 6/14 @ 0830.  Needs istat, ekg on arrival

## 2014-11-12 NOTE — H&P (Signed)
History of Present Illness    F/u - PCP is Dr. Levada Dy.     1-bladder cancer - July 2015- High-grade multifocal TA (left bladder neck, left posterior, dome), CIS (multifocal posterior); there was muscle present in the biopsies and negative.  normal exam under anesthesia, normal DRE, bladder biopsy TURBT. Presented with LUTS and MH.   -last upper tract: June 2015 CT hematuria benign  -last cystoscopy: Feb 2016 - some left posterior erythema   -last BCG: Oct 2015 #6 induction reduced to 1/2 dose with significant LUTS.   -last cytology: Feb 2016 - negative     2-BPH with urinary frequency-May 2015-frequency and urgency. Occasional weak stream usually and adequate flow. No inc empty, gross hematuria or dysuria. He does describe an "ache" at the end of his penis sometimes when he voids. On surveillance. He denies a history of kidney stones. He has not tried anything for his symptoms. PVR normal.  -June 2015 37 g prostate on CT   -Jul 2015 - trial of vesicare; mild BPH on exam and cystoscopy   -Sept 2015 - tried daily Cialis  -Feb 2016 on tamsulosin    3-PCa screening -  -May 2015 - normal DRE, small prostate; PSA 0.44  -Jul 2015 normal EUA, DRE    4-ED - Sept 2015 - erectile dysfunction. He has trouble maintaining an erection x several years. He is tried Viagra and Cialis in the past. Viagra caused a lot of stuffiness. Cialis seemed to work well. He has no chest pain and takes no nitroglycerin. Tried daily Cialis         May 2016 interval hx    Patient returns and continue management of BPH and bladder cancer. He continues tamsulosin. He's had no dysuria or gross hematuria.             Past Medical History Problems  1. History of arthritis (Z87.39) 2. History of diverticulitis of colon (Z87.19) 3. History of gastric ulcer (Z87.19) 4. History of hypertension (Z86.79) 5. History of sleep apnea (Z87.09)  Surgical History Problems  1. History of  Arthroscopy Knee Left 2. History of Colostomy 3. History of Cystoscopy With Fulguration Medium Lesion (2-5cm) 4. History of Cystoscopy With Fulguration Minor Lesion (Under 71mm) 5. History of Knee Surgery Left 6. History of Shoulder Surgery  Current Meds 1. Afrin Nasal Spray SOLN;  Therapy: (Recorded:09Nov2015) to Recorded 2. Diclofenac Sodium 75 MG Oral Tablet Delayed Release;  Therapy: (Recorded:09Nov2015) to Recorded 3. Fish Oil CAPS;  Therapy: (Recorded:17Feb2016) to Recorded 4. Grape Seed CAPS;  Therapy: (Recorded:17Feb2016) to Recorded 5. Lisinopril-Hydrochlorothiazide 20-12.5 MG Oral Tablet;  Therapy: (Recorded:22May2015) to Recorded 6. Tamsulosin HCl - 0.4 MG Oral Capsule;  Therapy: 22Oct2015 to Recorded 7. Tylenol TABS;  Therapy: (Recorded:22May2015) to Recorded  Allergies Medication  1. Codeine Derivatives  Family History Problems  1. Family history of Alzheimer's disease (Z82.0) : Mother 2. Family history of cardiac disorder (Z82.49) : Father 3. Family history of congestive heart failure (Z82.49) : Brother  Social History Problems  1. Denied: History of Alcohol use 2. Caffeine use (F15.90)   2 cups of coffee 3. Former smoker [phone]) 4. Married 5. Number of children   2 daughters 6. Retired  Engineer, site Vital Signs [Data Includes: Last 1 Day]  Recorded: [id number] 09:16AM  Blood Pressure: 140 / 93 Temperature: 97.8 F Heart Rate: 73  Physical Exam Constitutional: Well nourished and well developed . No acute distress.  Pulmonary: No respiratory distress and normal respiratory rhythm and effort.  Cardiovascular:  Heart rate and rhythm are normal . No peripheral edema.  Neuro/Psych:. Mood and affect are appropriate.    Results/Data Urine [Data Includes: Last 1 Day]   [id number]  COLOR YELLOW   APPEARANCE CLEAR   SPECIFIC GRAVITY 1.020   pH 6.0   GLUCOSE NEG mg/dL  BILIRUBIN NEG   KETONE NEG mg/dL  BLOOD NEG   PROTEIN NEG mg/dL  UROBILINOGEN 1  mg/dL  NITRITE NEG   LEUKOCYTE ESTERASE NEG    Procedure  Procedure: Cystoscopy   Informed Consent: Risks, benefits, and potential adverse events were discussed and informed consent was obtained from the patient.  Prep: The patient was prepped with betadine.  Procedure Note:  Urethral meatus:. No abnormalities.  Anterior urethra: No abnormalities.  Prostatic urethra: No abnormalities.  Bladder: Visulization was clear. The ureteral orifices were in the normal anatomic position bilaterally and had clear efflux of urine. A systematic survey of the bladder demonstrated no bladder tumors or stones. The mucosa was smooth without abnormalities. Examination of the bladder demonstrated erythematous mucosa located on the posterior aspect of the bladder . The patient tolerated the procedure well.  Complications: None.    Assessment Assessed  1. Malignant neoplasm of overlapping sites of bladder (C67.8)  Plan Health Maintenance  1. UA With REFLEX; [Do Not Release]; Status:Complete;   Done: [id number] 09:05AM Malignant neoplasm of overlapping sites of bladder  2. Follow-up Schedule Surgery Office  Follow-up  Status: Hold For - Appointment   Requested for: [id number]  EUA, cystoscopy / bladder biopsy, retrograde and then BCG in 3 weeks, cystoscopy in 4 months   Discussion/Summary    Bladder cancer - no obvious tumor on cystoscopy but the erythema and red areas in the back of the bladder persist and may be more prominent. I recommend we biopsied these areas and fulgurate them. Then plan to resume BCG and surveillance cystoscopy. I discussed with the patient and his wife the nature risks benefits and alternatives to bladder biopsy. We'll also obtain retrograde pyelograms for upper tract surveillance and perform exam under anesthesia. All questions answered, patient elects to proceed.    Signatures Electronically signed by : Festus Aloe, M.D.; Oct 25 2014 10:23AM EST

## 2014-11-13 ENCOUNTER — Ambulatory Visit (HOSPITAL_BASED_OUTPATIENT_CLINIC_OR_DEPARTMENT_OTHER): Payer: Medicare Other | Admitting: Anesthesiology

## 2014-11-13 ENCOUNTER — Encounter (HOSPITAL_BASED_OUTPATIENT_CLINIC_OR_DEPARTMENT_OTHER): Payer: Self-pay | Admitting: *Deleted

## 2014-11-13 ENCOUNTER — Other Ambulatory Visit: Payer: Self-pay

## 2014-11-13 ENCOUNTER — Ambulatory Visit (HOSPITAL_BASED_OUTPATIENT_CLINIC_OR_DEPARTMENT_OTHER)
Admission: RE | Admit: 2014-11-13 | Discharge: 2014-11-13 | Disposition: A | Discharge status: Home / Self Care | Payer: Medicare Other | Source: Ambulatory Visit | Attending: Urology | Admitting: Urology

## 2014-11-13 ENCOUNTER — Encounter (HOSPITAL_BASED_OUTPATIENT_CLINIC_OR_DEPARTMENT_OTHER)
Admission: RE | Disposition: A | Discharge status: Home / Self Care | Payer: Self-pay | Source: Ambulatory Visit | Attending: Urology

## 2014-11-13 DIAGNOSIS — R3915 Urgency of urination: Secondary | ICD-10-CM | POA: Diagnosis not present

## 2014-11-13 DIAGNOSIS — C673 Malignant neoplasm of anterior wall of bladder: Secondary | ICD-10-CM | POA: Insufficient documentation

## 2014-11-13 DIAGNOSIS — D090 Carcinoma in situ of bladder: Secondary | ICD-10-CM | POA: Diagnosis not present

## 2014-11-13 DIAGNOSIS — M1990 Unspecified osteoarthritis, unspecified site: Secondary | ICD-10-CM | POA: Insufficient documentation

## 2014-11-13 DIAGNOSIS — C679 Malignant neoplasm of bladder, unspecified: Secondary | ICD-10-CM | POA: Diagnosis not present

## 2014-11-13 DIAGNOSIS — R350 Frequency of micturition: Secondary | ICD-10-CM | POA: Insufficient documentation

## 2014-11-13 DIAGNOSIS — N401 Enlarged prostate with lower urinary tract symptoms: Secondary | ICD-10-CM | POA: Insufficient documentation

## 2014-11-13 DIAGNOSIS — I10 Essential (primary) hypertension: Secondary | ICD-10-CM | POA: Diagnosis not present

## 2014-11-13 DIAGNOSIS — C674 Malignant neoplasm of posterior wall of bladder: Secondary | ICD-10-CM | POA: Insufficient documentation

## 2014-11-13 DIAGNOSIS — Z87891 Personal history of nicotine dependence: Secondary | ICD-10-CM | POA: Diagnosis not present

## 2014-11-13 DIAGNOSIS — N529 Male erectile dysfunction, unspecified: Secondary | ICD-10-CM | POA: Diagnosis not present

## 2014-11-13 DIAGNOSIS — C678 Malignant neoplasm of overlapping sites of bladder: Secondary | ICD-10-CM | POA: Diagnosis not present

## 2014-11-13 DIAGNOSIS — N3090 Cystitis, unspecified without hematuria: Secondary | ICD-10-CM | POA: Diagnosis not present

## 2014-11-13 DIAGNOSIS — G4730 Sleep apnea, unspecified: Secondary | ICD-10-CM | POA: Diagnosis not present

## 2014-11-13 HISTORY — PX: CYSTOSCOPY WITH BIOPSY: SHX5122

## 2014-11-13 HISTORY — DX: Presence of dental prosthetic device (complete) (partial): Z97.2

## 2014-11-13 HISTORY — DX: Tinnitus, unspecified ear: H93.19

## 2014-11-13 LAB — POCT I-STAT, CHEM 8
BUN: 19 mg/dL (ref 6–20)
CHLORIDE: 100 mmol/L — AB (ref 101–111)
Calcium, Ion: 1.25 mmol/L (ref 1.13–1.30)
Creatinine, Ser: 0.8 mg/dL (ref 0.61–1.24)
GLUCOSE: 97 mg/dL (ref 65–99)
HCT: 47 % (ref 39.0–52.0)
Hemoglobin: 16 g/dL (ref 13.0–17.0)
POTASSIUM: 3.8 mmol/L (ref 3.5–5.1)
Sodium: 139 mmol/L (ref 135–145)
TCO2: 24 mmol/L (ref 0–100)

## 2014-11-13 SURGERY — CYSTOSCOPY, WITH BIOPSY
Anesthesia: General | Site: Bladder | Laterality: Bilateral

## 2014-11-13 MED ORDER — BELLADONNA ALKALOIDS-OPIUM 16.2-60 MG RE SUPP
RECTAL | Status: AC
  Filled 2014-11-13: qty 1

## 2014-11-13 MED ORDER — ONDANSETRON HCL 4 MG/2ML IJ SOLN
INTRAMUSCULAR | Status: DC | PRN
  Administered 2014-11-13: 4 mg via INTRAVENOUS

## 2014-11-13 MED ORDER — MIDAZOLAM HCL 5 MG/5ML IJ SOLN
INTRAMUSCULAR | Status: DC | PRN
  Administered 2014-11-13: 2 mg via INTRAVENOUS

## 2014-11-13 MED ORDER — LACTATED RINGERS IV SOLN
INTRAVENOUS | Status: DC
  Administered 2014-11-13: 09:00:00 via INTRAVENOUS
  Filled 2014-11-13: qty 1000

## 2014-11-13 MED ORDER — SULFAMETHOXAZOLE-TRIMETHOPRIM 800-160 MG PO TABS: 1.0000 | ORAL_TABLET | Freq: Two times a day (BID) | ORAL | Status: DC

## 2014-11-13 MED ORDER — FENTANYL CITRATE (PF) 100 MCG/2ML IJ SOLN
INTRAMUSCULAR | Status: AC
  Filled 2014-11-13: qty 4

## 2014-11-13 MED ORDER — PROPOFOL 10 MG/ML IV BOLUS
INTRAVENOUS | Status: DC | PRN
  Administered 2014-11-13: 160 mg via INTRAVENOUS

## 2014-11-13 MED ORDER — DEXAMETHASONE SODIUM PHOSPHATE 4 MG/ML IJ SOLN
INTRAMUSCULAR | Status: DC | PRN
  Administered 2014-11-13 (×2): 5 mg via INTRAVENOUS

## 2014-11-13 MED ORDER — HYDROMORPHONE HCL 1 MG/ML IJ SOLN
0.2500 mg | INTRAMUSCULAR | Status: DC | PRN
  Filled 2014-11-13: qty 1

## 2014-11-13 MED ORDER — CEFAZOLIN SODIUM-DEXTROSE 2-3 GM-% IV SOLR
INTRAVENOUS | Status: AC
  Filled 2014-11-13: qty 50

## 2014-11-13 MED ORDER — IOHEXOL 350 MG/ML SOLN
INTRAVENOUS | Status: DC | PRN
  Administered 2014-11-13: 8 mL via INTRAVENOUS

## 2014-11-13 MED ORDER — HYDROCODONE-ACETAMINOPHEN 5-325 MG PO TABS: 1.0000 | ORAL_TABLET | Freq: Four times a day (QID) | ORAL | Status: DC | PRN

## 2014-11-13 MED ORDER — STERILE WATER FOR IRRIGATION IR SOLN
Status: DC | PRN
  Administered 2014-11-13: 3000 mL

## 2014-11-13 MED ORDER — CEFAZOLIN SODIUM-DEXTROSE 2-3 GM-% IV SOLR
2.0000 g | INTRAVENOUS | Status: AC
  Administered 2014-11-13: 2 g via INTRAVENOUS
  Filled 2014-11-13: qty 50

## 2014-11-13 MED ORDER — LIDOCAINE HCL (CARDIAC) 20 MG/ML IV SOLN
INTRAVENOUS | Status: DC | PRN
  Administered 2014-11-13: 80 mg via INTRAVENOUS

## 2014-11-13 MED ORDER — FENTANYL CITRATE (PF) 100 MCG/2ML IJ SOLN
INTRAMUSCULAR | Status: DC | PRN
  Administered 2014-11-13: 25 ug via INTRAVENOUS
  Administered 2014-11-13: 50 ug via INTRAVENOUS
  Administered 2014-11-13: 25 ug via INTRAVENOUS

## 2014-11-13 MED ORDER — CEFAZOLIN SODIUM 1-5 GM-% IV SOLN
1.0000 g | INTRAVENOUS | Status: DC
  Filled 2014-11-13: qty 50

## 2014-11-13 MED ORDER — MIDAZOLAM HCL 2 MG/2ML IJ SOLN
INTRAMUSCULAR | Status: AC
  Filled 2014-11-13: qty 2

## 2014-11-13 MED ORDER — BELLADONNA ALKALOIDS-OPIUM 16.2-60 MG RE SUPP
RECTAL | Status: DC | PRN
  Administered 2014-11-13: 1 via RECTAL

## 2014-11-13 MED ORDER — ACETAMINOPHEN 10 MG/ML IV SOLN
INTRAVENOUS | Status: DC | PRN
  Administered 2014-11-13: 1000 mg via INTRAVENOUS

## 2014-11-13 SURGICAL SUPPLY — 25 items
BAG DRAIN URO-CYSTO SKYTR STRL (DRAIN) ×3 IMPLANT
BAG URINE LEG 19OZ MD ST LTX (BAG) ×3 IMPLANT
CANISTER SUCT LVC 12 LTR MEDI- (MISCELLANEOUS) IMPLANT
CATH FOLEY 2WAY SLVR  5CC 16FR (CATHETERS) ×2
CATH FOLEY 2WAY SLVR 5CC 16FR (CATHETERS) ×1 IMPLANT
CATH INTERMIT  6FR 70CM (CATHETERS) ×3 IMPLANT
CATH URET 5FR 28IN CONE TIP (BALLOONS) ×2
CATH URET 5FR 70CM CONE TIP (BALLOONS) ×1 IMPLANT
CLOTH BEACON ORANGE TIMEOUT ST (SAFETY) ×3 IMPLANT
ELECT REM PT RETURN 9FT ADLT (ELECTROSURGICAL) ×3
ELECTRODE REM PT RTRN 9FT ADLT (ELECTROSURGICAL) ×1 IMPLANT
GLOVE BIO SURGEON STRL SZ 6.5 (GLOVE) ×2 IMPLANT
GLOVE BIO SURGEON STRL SZ7.5 (GLOVE) ×9 IMPLANT
GLOVE BIO SURGEONS STRL SZ 6.5 (GLOVE) ×1
GLOVE INDICATOR 6.5 STRL GRN (GLOVE) ×3 IMPLANT
GLOVE INDICATOR 7.0 STRL GRN (GLOVE) ×3 IMPLANT
GOWN STRL REUS W/ TWL XL LVL3 (GOWN DISPOSABLE) ×1 IMPLANT
GOWN STRL REUS W/TWL LRG LVL3 (GOWN DISPOSABLE) ×6 IMPLANT
GOWN STRL REUS W/TWL XL LVL3 (GOWN DISPOSABLE) ×2
NEEDLE HYPO 22GX1.5 SAFETY (NEEDLE) IMPLANT
NS IRRIG 500ML POUR BTL (IV SOLUTION) IMPLANT
PACK CYSTO (CUSTOM PROCEDURE TRAY) ×3 IMPLANT
TUBE CONNECTING 12'X1/4 (SUCTIONS) ×1
TUBE CONNECTING 12X1/4 (SUCTIONS) ×2 IMPLANT
WATER STERILE IRR 3000ML UROMA (IV SOLUTION) ×3 IMPLANT

## 2014-11-13 NOTE — Anesthesia Preprocedure Evaluation (Addendum)
Anesthesia Evaluation  Patient identified by MRN, date of birth, ID band Patient awake    Reviewed: Allergy & Precautions, H&P , NPO status , Patient's Chart, lab work & pertinent test results  History of Anesthesia Complications (+) PONV  Airway Mallampati: II  TM Distance: >3 FB Neck ROM: Full    Dental no notable dental hx. (+) Teeth Intact, Dental Advisory Given   Pulmonary sleep apnea and Oxygen sleep apnea , former smoker,  breath sounds clear to auscultation  Pulmonary exam normal       Cardiovascular hypertension, Pt. on medications Rhythm:Regular Rate:Normal     Neuro/Psych Anxiety negative neurological ROS  negative psych ROS   GI/Hepatic negative GI ROS, Neg liver ROS,   Endo/Other  negative endocrine ROS  Renal/GU negative Renal ROS  negative genitourinary   Musculoskeletal  (+) Arthritis -, Osteoarthritis,    Abdominal   Peds  Hematology negative hematology ROS (+)   Anesthesia Other Findings   Reproductive/Obstetrics negative OB ROS                            Anesthesia Physical Anesthesia Plan  ASA: III  Anesthesia Plan: General   Post-op Pain Management:    Induction: Intravenous  Airway Management Planned: LMA  Additional Equipment:   Intra-op Plan:   Post-operative Plan: Extubation in OR  Informed Consent: I have reviewed the patients History and Physical, chart, labs and discussed the procedure including the risks, benefits and alternatives for the proposed anesthesia with the patient or authorized representative who has indicated his/her understanding and acceptance.   Dental advisory given  Plan Discussed with: CRNA  Anesthesia Plan Comments:         Anesthesia Quick Evaluation

## 2014-11-13 NOTE — Op Note (Signed)
Preoperative diagnosis: Bladder cancer Postoperative diagnosis: Bladder cancer  Procedure: Exam under anesthesia, Cystoscopy, bladder biopsy with fulguration, Bilateral retrograde pyelogram  Surgeon: Junious Silk  Anesthesia: Fitzgerald  Type of anesthesia: Gen.  Indication for procedure: Billy Rasmussen is a 69 year old male with a history of multifocal high-grade TA disease and CVIS. He responded well to BCG but has some persistent erythematous areas around the posterior biopsy sites and he was brought in today for cystoscopy bladder biopsy and fulguration with bilateral retrograde pyelogram. He has been well.  Findings: On exam under anesthesia the penis was circumcised and without mass or lesion. The testicles were descended bilaterally and palpably normal. On digital rectal exam the prostate was small and benign without hard area or nodules. All landmarks preserved.  On cystoscopy the urethra was normal, the prostatic urethra appeared normal and nonobstructive. The trigone and ureteral orifices were in their normal orthotopic position. There were no stones or foreign bodies in the bladder. The bladder mucosa had no obvious tumor but some areas of erythema posteriorly up toward the dome and on the left anterior bladder wall. These were biopsied and fulgurated.  Right retrograde pyelogram-this outlined a single ureter single collecting system unit without filling defect, stricture or dilation. There was brisk drainage of contrast. In fact when I hit the fluoroscopy pedal to get the image again to save it the collecting system had already drained.  Left retrograde pyelogram-this outlined a single ureter single collecting system unit without filling defect stricture dilation. It drained briskly as well.  Description of procedure: After consent was obtained patient brought to the operating room. After adequate anesthesia he was placed in lithotomy position and prepped and draped in the usual sterile  fashion. A timeout was performed to confirm the patient and procedure. An exam under anesthesia was performed. The cystoscope was passed per urethra and the bladder carefully examined with a 30 and 70 lens. The bladder was triple rinsed. The 30 lens was replaced and the posterior was biopsied 2 and fulgurated. The left anterior was biopsied 2 and fulgurated. The dome was biopsied 1 and fulgurated. There was excellent hemostasis at low-pressure. The flexible biopsy forceps and the Bugbee were utilized. The bladder was again triple rinsed.  A cone-tipped catheter was used to catheterize each ureteral orifice and retrograde injection of contrast was performed. The systems filled rapidly and drained quickly.  The biopsy sites were inspected again and noted to have excellent hemostasis. The bladder was slightly refilled and the scope removed. I placed a 28 [place] Foley for wake up. A B&O suppository was placed. The patient was awakened and taken to the recovery room in stable condition.  Complications: None  Blood loss: Minimal  Specimens to pathology:  #1 posterior bladder biopsy,  #2 left anterior bladder biopsy, #3 dome bladder biopsy  Drains: 16 French Foley  Disposition: Patient stable to PACU

## 2014-11-13 NOTE — Interval H&P Note (Signed)
History and Physical Interval Note:  11/13/2014 9:44 AM  Billy Rasmussen  has presented today for surgery, with the diagnosis of BLADDER CANCER  The various methods of treatment have been discussed with the patient and family. After consideration of risks, benefits and other options for treatment, the patient has consented to  Procedure(s): [place] (Bilateral) as a surgical intervention .  The patient's history has been reviewed, patient examined, no change in status, stable for surgery.  I have reviewed the patient's chart and labs.  Questions were answered to the patient's satisfaction.  Pt has been well. No dysuria or fever. He elects to proceed.    [name]

## 2014-11-13 NOTE — Anesthesia Procedure Notes (Signed)
Procedure Name: LMA Insertion Date/Time: 11/13/2014 9:45 AM Performed by: Mechele Claude Pre-anesthesia Checklist: Patient identified, Emergency Drugs available, Suction available and Patient being monitored Patient Re-evaluated:Patient Re-evaluated prior to inductionOxygen Delivery Method: Circle System Utilized Preoxygenation: Pre-oxygenation with 100% oxygen Intubation Type: IV induction Ventilation: Mask ventilation without difficulty LMA: LMA inserted LMA Size: 5.0 Number of attempts: 1 Airway Equipment and Method: bite block Placement Confirmation: positive ETCO2 Tube secured with: Tape Dental Injury: Teeth and Oropharynx as per pre-operative assessment

## 2014-11-13 NOTE — Discharge Instructions (Signed)
Cystoscopy, Care After °Refer to this sheet in the next few weeks. These instructions provide you with information on caring for yourself after your procedure. Your caregiver may also give you more specific instructions. Your treatment has been planned according to current medical practices, but problems sometimes occur. Call your caregiver if you have any problems or questions after your procedure. °HOME CARE INSTRUCTIONS  °Things you can do to ease any discomfort after your procedure include: °· Drinking enough water and fluids to keep your urine clear or pale yellow. °· Taking a warm bath to relieve any burning feelings. °SEEK IMMEDIATE MEDICAL CARE IF:  °· You have an increase in blood in your urine. °· You notice blood clots in your urine. °· You have difficulty passing urine. °· You have the chills. °· You have abdominal pain. °· You have a fever or persistent symptoms for more than 2-3 days. °· You have a fever and your symptoms suddenly get worse. °MAKE SURE YOU:  °· Understand these instructions. °· Will watch your condition. °· Will get help right away if you are not doing well or get worse. °Document Released: 12/05/2004 Document Revised: 01/18/2013 Document Reviewed: 11/09/2011 °[employer]® Patient Information ©2015 [employer]. This information is not intended to replace advice given to you by your health care provider. Make sure you discuss any questions you have with your health care provider. ° °Post Anesthesia Home Care Instructions ° °Activity: °Get plenty of rest for the remainder of the day. A responsible adult should stay with you for 24 hours following the procedure.  °For the next 24 hours, DO NOT: °-Drive a car °-Operate machinery °-Drink alcoholic beverages °-Take any medication unless instructed by your physician °-Make any legal decisions or sign important papers. ° °Meals: °Start with liquid foods such as gelatin or soup. Progress to regular foods as tolerated. Avoid greasy, spicy, heavy  foods. If nausea and/or vomiting occur, drink only clear liquids until the nausea and/or vomiting subsides. Call your physician if vomiting continues. ° °Special Instructions/Symptoms: °Your throat may feel dry or sore from the anesthesia or the breathing tube placed in your throat during surgery. If this causes discomfort, gargle with warm salt water. The discomfort should disappear within 24 hours. ° °If you had a scopolamine patch placed behind your ear for the management of post- operative nausea and/or vomiting: ° °1. The medication in the patch is effective for 72 hours, after which it should be removed.  Wrap patch in a tissue and discard in the trash. Wash hands thoroughly with soap and water. °2. You may remove the patch earlier than 72 hours if you experience unpleasant side effects which may include dry mouth, dizziness or visual disturbances. °3. Avoid touching the patch. Wash your hands with soap and water after contact with the patch. °  ° °

## 2014-11-13 NOTE — Transfer of Care (Signed)
Last Vitals:  Filed Vitals:   11/13/14 0842  BP: 140/85  Pulse: 77  Temp: 36.8 C  Resp: 14   Immediate Anesthesia Transfer of Care Note  Patient: Billy Rasmussen  Procedure(s) Performed: Procedure(s) (LRB): CYSTOSCOPY WITH  BLADDER BIOPSY FULGERATION AND BILATERAL RETROGRADE PYELOGRAMS (Bilateral)  Patient Location: PACU  Anesthesia Type: General  Level of Consciousness: awake, alert  and oriented  Airway & Oxygen Therapy: Patient Spontanous Breathing and Patient connected to face mask oxygen  Post-op Assessment: Report given to PACU RN and Post -op Vital signs reviewed and stable  Post vital signs: Reviewed and stable  Complications: No apparent anesthesia complications

## 2014-11-13 NOTE — Anesthesia Postprocedure Evaluation (Signed)
  Anesthesia Post-op Note  Patient: Billy Rasmussen  Procedure(s) Performed: Procedure(s): CYSTOSCOPY WITH  BLADDER BIOPSY FULGERATION AND BILATERAL RETROGRADE PYELOGRAMS (Bilateral)  Patient Location: PACU  Anesthesia Type:General  Level of Consciousness: awake and alert   Airway and Oxygen Therapy: Patient Spontanous Breathing  Post-op Pain: none  Post-op Assessment: Post-op Vital signs reviewed, Patient's Cardiovascular Status Stable and Respiratory Function Stable  Post-op Vital Signs: Reviewed  Filed Vitals:   11/13/14 1115  BP: 129/76  Pulse: 68  Temp:   Resp: 18    Complications: No apparent anesthesia complications

## 2014-11-15 ENCOUNTER — Encounter (HOSPITAL_BASED_OUTPATIENT_CLINIC_OR_DEPARTMENT_OTHER): Payer: Self-pay | Admitting: Urology

## 2014-12-04 DIAGNOSIS — C678 Malignant neoplasm of overlapping sites of bladder: Secondary | ICD-10-CM | POA: Diagnosis not present

## 2014-12-11 DIAGNOSIS — D090 Carcinoma in situ of bladder: Secondary | ICD-10-CM | POA: Diagnosis not present

## 2014-12-11 DIAGNOSIS — Z5111 Encounter for antineoplastic chemotherapy: Secondary | ICD-10-CM | POA: Diagnosis not present

## 2014-12-18 DIAGNOSIS — Z5111 Encounter for antineoplastic chemotherapy: Secondary | ICD-10-CM | POA: Diagnosis not present

## 2014-12-18 DIAGNOSIS — D090 Carcinoma in situ of bladder: Secondary | ICD-10-CM | POA: Diagnosis not present

## 2014-12-25 DIAGNOSIS — Z5111 Encounter for antineoplastic chemotherapy: Secondary | ICD-10-CM | POA: Diagnosis not present

## 2014-12-25 DIAGNOSIS — D090 Carcinoma in situ of bladder: Secondary | ICD-10-CM | POA: Diagnosis not present

## 2015-01-01 DIAGNOSIS — Z5111 Encounter for antineoplastic chemotherapy: Secondary | ICD-10-CM | POA: Diagnosis not present

## 2015-01-01 DIAGNOSIS — D090 Carcinoma in situ of bladder: Secondary | ICD-10-CM | POA: Diagnosis not present

## 2015-01-08 DIAGNOSIS — C678 Malignant neoplasm of overlapping sites of bladder: Secondary | ICD-10-CM | POA: Diagnosis not present

## 2015-01-08 DIAGNOSIS — R3915 Urgency of urination: Secondary | ICD-10-CM | POA: Diagnosis not present

## 2015-01-15 DIAGNOSIS — Z5111 Encounter for antineoplastic chemotherapy: Secondary | ICD-10-CM | POA: Diagnosis not present

## 2015-01-15 DIAGNOSIS — C678 Malignant neoplasm of overlapping sites of bladder: Secondary | ICD-10-CM | POA: Diagnosis not present

## 2015-01-22 DIAGNOSIS — C678 Malignant neoplasm of overlapping sites of bladder: Secondary | ICD-10-CM | POA: Diagnosis not present

## 2015-01-22 DIAGNOSIS — Z5111 Encounter for antineoplastic chemotherapy: Secondary | ICD-10-CM | POA: Diagnosis not present

## 2015-01-28 DIAGNOSIS — R300 Dysuria: Secondary | ICD-10-CM | POA: Diagnosis not present

## 2015-01-28 DIAGNOSIS — R350 Frequency of micturition: Secondary | ICD-10-CM | POA: Diagnosis not present

## 2015-02-12 DIAGNOSIS — N400 Enlarged prostate without lower urinary tract symptoms: Secondary | ICD-10-CM | POA: Diagnosis not present

## 2015-02-12 DIAGNOSIS — Z125 Encounter for screening for malignant neoplasm of prostate: Secondary | ICD-10-CM | POA: Diagnosis not present

## 2015-02-12 DIAGNOSIS — Z0000 Encounter for general adult medical examination without abnormal findings: Secondary | ICD-10-CM | POA: Diagnosis not present

## 2015-02-12 DIAGNOSIS — I10 Essential (primary) hypertension: Secondary | ICD-10-CM | POA: Diagnosis not present

## 2015-03-06 DIAGNOSIS — R3915 Urgency of urination: Secondary | ICD-10-CM | POA: Diagnosis not present

## 2015-03-06 DIAGNOSIS — N403 Nodular prostate with lower urinary tract symptoms: Secondary | ICD-10-CM | POA: Diagnosis not present

## 2015-03-06 DIAGNOSIS — C678 Malignant neoplasm of overlapping sites of bladder: Secondary | ICD-10-CM | POA: Diagnosis not present

## 2015-03-06 DIAGNOSIS — R300 Dysuria: Secondary | ICD-10-CM | POA: Diagnosis not present

## 2015-03-21 DIAGNOSIS — M79671 Pain in right foot: Secondary | ICD-10-CM | POA: Diagnosis not present

## 2015-03-21 DIAGNOSIS — M722 Plantar fascial fibromatosis: Secondary | ICD-10-CM | POA: Diagnosis not present

## 2015-03-21 DIAGNOSIS — M7731 Calcaneal spur, right foot: Secondary | ICD-10-CM | POA: Diagnosis not present

## 2015-03-25 DIAGNOSIS — Z23 Encounter for immunization: Secondary | ICD-10-CM | POA: Diagnosis not present

## 2015-05-10 DIAGNOSIS — R252 Cramp and spasm: Secondary | ICD-10-CM | POA: Diagnosis not present

## 2015-05-10 DIAGNOSIS — Z79899 Other long term (current) drug therapy: Secondary | ICD-10-CM | POA: Diagnosis not present

## 2015-06-11 DIAGNOSIS — R799 Abnormal finding of blood chemistry, unspecified: Secondary | ICD-10-CM | POA: Diagnosis not present

## 2015-06-20 DIAGNOSIS — C678 Malignant neoplasm of overlapping sites of bladder: Secondary | ICD-10-CM | POA: Diagnosis not present

## 2015-06-20 DIAGNOSIS — Z0000 Encounter for general adult medical examination without abnormal findings: Secondary | ICD-10-CM | POA: Diagnosis not present

## 2015-06-25 DIAGNOSIS — C679 Malignant neoplasm of bladder, unspecified: Secondary | ICD-10-CM | POA: Diagnosis not present

## 2015-06-25 DIAGNOSIS — C678 Malignant neoplasm of overlapping sites of bladder: Secondary | ICD-10-CM | POA: Diagnosis not present

## 2015-07-12 DIAGNOSIS — C678 Malignant neoplasm of overlapping sites of bladder: Secondary | ICD-10-CM | POA: Diagnosis not present

## 2015-07-12 DIAGNOSIS — Z0000 Encounter for general adult medical examination without abnormal findings: Secondary | ICD-10-CM | POA: Diagnosis not present

## 2015-07-12 DIAGNOSIS — D494 Neoplasm of unspecified behavior of bladder: Secondary | ICD-10-CM | POA: Diagnosis not present

## 2015-07-19 DIAGNOSIS — Z5111 Encounter for antineoplastic chemotherapy: Secondary | ICD-10-CM | POA: Diagnosis not present

## 2015-07-19 DIAGNOSIS — C678 Malignant neoplasm of overlapping sites of bladder: Secondary | ICD-10-CM | POA: Diagnosis not present

## 2015-07-25 DIAGNOSIS — Z0000 Encounter for general adult medical examination without abnormal findings: Secondary | ICD-10-CM | POA: Diagnosis not present

## 2015-07-25 DIAGNOSIS — R300 Dysuria: Secondary | ICD-10-CM | POA: Diagnosis not present

## 2015-07-25 DIAGNOSIS — H903 Sensorineural hearing loss, bilateral: Secondary | ICD-10-CM | POA: Diagnosis not present

## 2015-07-25 DIAGNOSIS — Z5111 Encounter for antineoplastic chemotherapy: Secondary | ICD-10-CM | POA: Diagnosis not present

## 2015-07-25 DIAGNOSIS — C678 Malignant neoplasm of overlapping sites of bladder: Secondary | ICD-10-CM | POA: Diagnosis not present

## 2015-08-07 DIAGNOSIS — H903 Sensorineural hearing loss, bilateral: Secondary | ICD-10-CM | POA: Diagnosis not present

## 2015-08-09 ENCOUNTER — Other Ambulatory Visit: Payer: Self-pay | Admitting: Otolaryngology

## 2015-08-09 DIAGNOSIS — Z77018 Contact with and (suspected) exposure to other hazardous metals: Secondary | ICD-10-CM

## 2015-08-09 DIAGNOSIS — H905 Unspecified sensorineural hearing loss: Secondary | ICD-10-CM

## 2015-08-14 ENCOUNTER — Other Ambulatory Visit: Payer: Self-pay | Admitting: Otolaryngology

## 2015-08-14 DIAGNOSIS — H9120 Sudden idiopathic hearing loss, unspecified ear: Secondary | ICD-10-CM

## 2015-08-15 ENCOUNTER — Other Ambulatory Visit: Payer: 59

## 2015-08-15 ENCOUNTER — Inpatient Hospital Stay: Admission: RE | Admit: 2015-08-15 | Payer: 59 | Source: Ambulatory Visit

## 2015-08-16 ENCOUNTER — Ambulatory Visit
Admission: RE | Admit: 2015-08-16 | Discharge: 2015-08-16 | Disposition: A | Discharge status: Home / Self Care | Payer: Medicare Other | Source: Ambulatory Visit | Attending: Otolaryngology | Admitting: Otolaryngology

## 2015-08-16 DIAGNOSIS — H9120 Sudden idiopathic hearing loss, unspecified ear: Secondary | ICD-10-CM

## 2015-08-16 DIAGNOSIS — H9190 Unspecified hearing loss, unspecified ear: Secondary | ICD-10-CM | POA: Diagnosis not present

## 2015-08-16 MED ORDER — IOPAMIDOL (ISOVUE-300) INJECTION 61%
75.0000 mL | Freq: Once | INTRAVENOUS | Status: AC | PRN
  Administered 2015-08-16: 75 mL via INTRAVENOUS

## 2015-08-22 DIAGNOSIS — I10 Essential (primary) hypertension: Secondary | ICD-10-CM | POA: Diagnosis not present

## 2015-09-02 DIAGNOSIS — I10 Essential (primary) hypertension: Secondary | ICD-10-CM | POA: Diagnosis not present

## 2015-09-02 DIAGNOSIS — R799 Abnormal finding of blood chemistry, unspecified: Secondary | ICD-10-CM | POA: Diagnosis not present

## 2015-09-02 DIAGNOSIS — R252 Cramp and spasm: Secondary | ICD-10-CM | POA: Diagnosis not present

## 2015-10-16 DIAGNOSIS — R350 Frequency of micturition: Secondary | ICD-10-CM | POA: Diagnosis not present

## 2015-10-16 DIAGNOSIS — Z0000 Encounter for general adult medical examination without abnormal findings: Secondary | ICD-10-CM | POA: Diagnosis not present

## 2015-10-16 DIAGNOSIS — C678 Malignant neoplasm of overlapping sites of bladder: Secondary | ICD-10-CM | POA: Diagnosis not present

## 2015-10-18 ENCOUNTER — Other Ambulatory Visit: Payer: Self-pay | Admitting: Urology

## 2015-10-22 DIAGNOSIS — C678 Malignant neoplasm of overlapping sites of bladder: Secondary | ICD-10-CM | POA: Diagnosis not present

## 2015-10-22 DIAGNOSIS — K8020 Calculus of gallbladder without cholecystitis without obstruction: Secondary | ICD-10-CM | POA: Diagnosis not present

## 2015-11-06 ENCOUNTER — Encounter (HOSPITAL_BASED_OUTPATIENT_CLINIC_OR_DEPARTMENT_OTHER): Payer: Self-pay | Admitting: *Deleted

## 2015-11-06 NOTE — Progress Notes (Signed)
NPO AFTER MN.   ARRIVE AT 0600.  NEEDS ISTAT. CURRENT EKG IN CHART AND EPIC.  WILL TAKE DITROPAN AM DOS W/ SIPS OF WATER.

## 2015-11-11 NOTE — H&P (Signed)
History of Present Illness F/u - PCP is Dr. Particia Nearing.  1-bladder cancer - June 2016 - posterior and left anterior CIS. Reintroduced BCG.  -July 2015- High-grade multifocal TA (left bladder neck, left posterior, dome), CIS (multifocal posterior); there was muscle present in the biopsies and negative. Presented with LUTS and MH.  -last cystoscopy: Jan 2017 - posterior erythema  -last cytology: Jan 2017 negative  -last BCG: Feb 2017 BCG 3/3 maintenance (maintenance through 2019)  -last upper tract: June 2016 - bilateral RGP; June 2015 CT hematuria benign 2-BPH with urinary frequency-May 2015-frequency and urgency. Occasional weak stream usually and adequate flow. No inc empty, gross hematuria or dysuria. He does describe an "ache" at the end of his penis sometimes when he voids. On surveillance. He denies a history of kidney stones. He has not tried anything for his symptoms. PVR normal. -June 2015 37 g prostate on CT  -Jul 2015 - trial of vesicare; mild BPH on exam and cystoscopy  -Sept 2015 - tried daily Cialis -Feb 2016 on tamsulosin -Aug 2016 - PVR 70 mL. 3-PCa screening - -May 2015 - normal DRE, small prostate; PSA 0.44 -Jul 2015 normal EUA, DRE -Jun 2016 nl DRE/EUA 4-ED - Sept 2015 - erectile dysfunction. He has trouble maintaining an erection x several years. He is tried Viagra and Cialis in the past. Viagra caused a lot of stuffiness. Cialis seemed to work well. He has no chest pain and takes no nitroglycerin. Tried daily Cialis  May 2017 int hx  Pt returns in continued management of bladder cancer in CIS. Patient is having more urgency and frequency than before. Some bladder pain. UA today shows 3-10 red blood cells per high-powered field. No bacteria. PVR today is 40 mL.  Past Medical History Problems  1. History of arthritis (Z87.39) 2. History of diverticulitis of colon (Z87.19) 3. History of gastric ulcer (Z87.19) 4. History of hypertension (Z86.79) 5. History of sleep apnea  (Z86.69) Surgical History Problems  1. History of Arthroscopy Knee Left 2. History of Colostomy 3. History of Cystoscopy With Fulguration Medium Lesion (2-5cm) 4. History of Cystoscopy With Fulguration Minor Lesion (Under 63mm) 5. History of Cystoscopy With Fulguration Minor Lesion (Under 38mm) 6. History of Knee Surgery Left 7. History of Shoulder Surgery Current Meds 1. Diclofenac Sodium 75 MG Oral Tablet Delayed Release; Therapy: (Recorded:09Nov2015) to Recorded 2. Grape Seed CAPS; Therapy: (Recorded:17Feb2016) to Recorded 3. Lisinopril-Hydrochlorothiazide 20-12.5 MG Oral Tablet; Therapy: (Recorded:22May2015) to Recorded 4. Tamsulosin HCl - 0.4 MG Oral Capsule; Therapy: 22Oct2015 to Recorded 5. Tylenol TABS; Therapy: (Recorded:22May2015) to Recorded Allergies Medication  1. Codeine Derivatives Family History Problems  1. Family history of Alzheimer's disease (Z82.0) : Mother 2. Family history of cardiac disorder (Z82.49) : Father 3. Family history of congestive heart failure (Z82.49) : Brother Social History Problems  1. Denied: History of Alcohol use 2. Caffeine use (F15.90)  2 cups of coffee 3. Former smoker [phone]) 4. Married 5. Number of children  2 daughters 6. Retired Engineer, site Vital Signs [Data Includes: Last 1 Day]  Recorded: [id number] 09:10AM  Height: 5 ft 7 in Weight: 230 lb  BMI Calculated: 36.02 BSA Calculated: 2.15 Blood Pressure: 133 / 88 Temperature: 97.1 F Heart Rate: 84 Physical Exam Constitutional: Well nourished and well developed . No acute distress.  Pulmonary: No respiratory distress and normal respiratory rhythm and effort.  Cardiovascular: Heart rate and rhythm are normal . No peripheral edema.  Neuro/Psych:. Mood and affect are appropriate.  Results/Data Urine [Data Includes: Last  1 Day]   [id number]  COLOR YELLOW   APPEARANCE CLEAR   SPECIFIC GRAVITY <1.005   pH 7.0   GLUCOSE NEGATIVE   BILIRUBIN NEGATIVE   KETONE  NEGATIVE   BLOOD 1+   PROTEIN NEGATIVE   NITRITE NEGATIVE   LEUKOCYTE ESTERASE NEGATIVE   SQUAMOUS EPITHELIAL/HPF 0-5 HPF  WBC 0-5 WBC/HPF  RBC 3-10 RBC/HPF  BACTERIA NONE SEEN HPF  CRYSTALS NONE SEEN HPF  CASTS NONE SEEN LPF  Yeast NONE SEEN HPF  PVR: Ultrasound PVR 40 ml.  Procedure Procedure: Cystoscopy  Informed Consent: Risks, benefits, and potential adverse events were discussed and informed consent was obtained from the patient.  Prep: The patient was prepped with betadine.  Antibiotic prophylaxis: Ciprofloxacin.  Procedure Note:  Urethral meatus:. No abnormalities.  Anterior urethra: No abnormalities.  Prostatic urethra: No abnormalities.  Bladder: Visulization was clear. The ureteral orifices were in the normal anatomic position bilaterally and had clear efflux of urine. A systematic survey of the bladder demonstrated no bladder tumors or stones. The mucosa was smooth without abnormalities. Examination of the bladder demonstrated erythematous mucosa. posteriorly which also seemed more thickened. The patient tolerated the procedure well.  Complications: None.  Assessment Assessed  1. Malignant neoplasm of overlapping sites of bladder (C67.8) 2. Increased urinary frequency (R35.0) Plan Health Maintenance  1. UA With REFLEX; [Do Not Release]; Status:Complete; [id number] 09:01AM Increased urinary frequency  2. PVR U/S; Status:Complete; Done: [id number] Malignant neoplasm of overlapping sites of bladder  3. Follow-up Schedule Surgery Office Follow-up Status: Hold For - Appointment  Requested for: [id number] 4. AU CT-HEMATURIA PROTOCOL; Status:Hold For - Appointment,PreCert,Date of Service,Print; Requested for:17May2017;  5. BUN & CREATININE; Status:Hold For - Specimen/Data Collection,Appointment; Requested for:17May2017;  6. Cysto; Status:Complete; [id number] Discussion/Summary Bladder cancer - some posterior erythema remains. It seems to be a little more  thickened posteriorly today and given his irritative voiding symptoms and given his prior high-grade TA disease and CIS posteriorly on to do some biopsies on this location possible TUR. We'll also assess upper tracts and bladder with a CT hematuria protocol. BUN and creatinine were sent.  Hesitancy, frequency, urgency - increased. Trial of Rapaflo. cc; Dr. Ronnald Ramp  Signatures Electronically signed by : Festus Aloe, M.D.; Oct 16 2015 9:56AM EST   The information contained in this medical record document is considered private and confidential patient information. This information can only be used for the medical diagnosis and/or medical services that are being provided by the patient's selected caregivers. This information can only be distributed outside of the patient's care if the patient agrees and signs waivers of authorization for this information to be sent to an outside source or route.  Add: CT showed thickening at the dome.

## 2015-11-11 NOTE — Anesthesia Preprocedure Evaluation (Addendum)
Anesthesia Evaluation  Patient identified by MRN, date of birth, ID band Patient awake    Reviewed: Allergy & Precautions, NPO status , Patient's Chart, lab work & pertinent test results  History of Anesthesia Complications (+) PONV  Airway Mallampati: II  TM Distance: >3 FB Neck ROM: Full    Dental  (+) Missing, Dental Advisory Given, Teeth Intact   Pulmonary sleep apnea and Oxygen sleep apnea , former smoker,    breath sounds clear to auscultation       Cardiovascular hypertension, + dysrhythmias  Rhythm:Regular Rate:Normal     Neuro/Psych  Neuromuscular disease    GI/Hepatic negative GI ROS, Neg liver ROS,   Endo/Other  negative endocrine ROS  Renal/GU negative Renal ROS     Musculoskeletal  (+) Arthritis , Fibromyalgia -  Abdominal (+) + obese,   Peds  Hematology   Anesthesia Other Findings   Reproductive/Obstetrics                            Anesthesia Physical Anesthesia Plan  ASA: III  Anesthesia Plan: General   Post-op Pain Management:    Induction: Intravenous  Airway Management Planned: LMA  Additional Equipment:   Intra-op Plan:   Post-operative Plan:   Informed Consent: I have reviewed the patients History and Physical, chart, labs and discussed the procedure including the risks, benefits and alternatives for the proposed anesthesia with the patient or authorized representative who has indicated his/her understanding and acceptance.   Dental advisory given  Plan Discussed with:   Anesthesia Plan Comments:         Anesthesia Quick Evaluation

## 2015-11-12 ENCOUNTER — Ambulatory Visit (HOSPITAL_BASED_OUTPATIENT_CLINIC_OR_DEPARTMENT_OTHER): Payer: Medicare Other | Admitting: Anesthesiology

## 2015-11-12 ENCOUNTER — Encounter (HOSPITAL_BASED_OUTPATIENT_CLINIC_OR_DEPARTMENT_OTHER): Payer: Self-pay | Admitting: *Deleted

## 2015-11-12 ENCOUNTER — Encounter (HOSPITAL_BASED_OUTPATIENT_CLINIC_OR_DEPARTMENT_OTHER)
Admission: RE | Disposition: A | Discharge status: Home / Self Care | Payer: Self-pay | Source: Ambulatory Visit | Attending: Urology

## 2015-11-12 ENCOUNTER — Ambulatory Visit (HOSPITAL_BASED_OUTPATIENT_CLINIC_OR_DEPARTMENT_OTHER)
Admission: RE | Admit: 2015-11-12 | Discharge: 2015-11-12 | Disposition: A | Discharge status: Home / Self Care | Payer: Medicare Other | Source: Ambulatory Visit | Attending: Urology | Admitting: Urology

## 2015-11-12 DIAGNOSIS — N3030 Trigonitis without hematuria: Secondary | ICD-10-CM | POA: Insufficient documentation

## 2015-11-12 DIAGNOSIS — G4730 Sleep apnea, unspecified: Secondary | ICD-10-CM | POA: Diagnosis not present

## 2015-11-12 DIAGNOSIS — Z87891 Personal history of nicotine dependence: Secondary | ICD-10-CM | POA: Insufficient documentation

## 2015-11-12 DIAGNOSIS — M1990 Unspecified osteoarthritis, unspecified site: Secondary | ICD-10-CM | POA: Diagnosis not present

## 2015-11-12 DIAGNOSIS — R350 Frequency of micturition: Secondary | ICD-10-CM | POA: Insufficient documentation

## 2015-11-12 DIAGNOSIS — Z885 Allergy status to narcotic agent status: Secondary | ICD-10-CM | POA: Diagnosis not present

## 2015-11-12 DIAGNOSIS — Z9981 Dependence on supplemental oxygen: Secondary | ICD-10-CM | POA: Insufficient documentation

## 2015-11-12 DIAGNOSIS — E669 Obesity, unspecified: Secondary | ICD-10-CM | POA: Diagnosis not present

## 2015-11-12 DIAGNOSIS — M797 Fibromyalgia: Secondary | ICD-10-CM | POA: Diagnosis not present

## 2015-11-12 DIAGNOSIS — C674 Malignant neoplasm of posterior wall of bladder: Secondary | ICD-10-CM | POA: Diagnosis not present

## 2015-11-12 DIAGNOSIS — N529 Male erectile dysfunction, unspecified: Secondary | ICD-10-CM | POA: Diagnosis not present

## 2015-11-12 DIAGNOSIS — C679 Malignant neoplasm of bladder, unspecified: Secondary | ICD-10-CM | POA: Diagnosis not present

## 2015-11-12 DIAGNOSIS — C678 Malignant neoplasm of overlapping sites of bladder: Secondary | ICD-10-CM | POA: Diagnosis not present

## 2015-11-12 DIAGNOSIS — Z79899 Other long term (current) drug therapy: Secondary | ICD-10-CM | POA: Diagnosis not present

## 2015-11-12 DIAGNOSIS — I10 Essential (primary) hypertension: Secondary | ICD-10-CM | POA: Diagnosis not present

## 2015-11-12 DIAGNOSIS — N3289 Other specified disorders of bladder: Secondary | ICD-10-CM | POA: Diagnosis present

## 2015-11-12 DIAGNOSIS — N401 Enlarged prostate with lower urinary tract symptoms: Secondary | ICD-10-CM | POA: Diagnosis not present

## 2015-11-12 DIAGNOSIS — Z6837 Body mass index (BMI) 37.0-37.9, adult: Secondary | ICD-10-CM | POA: Insufficient documentation

## 2015-11-12 DIAGNOSIS — I499 Cardiac arrhythmia, unspecified: Secondary | ICD-10-CM | POA: Diagnosis not present

## 2015-11-12 HISTORY — DX: Malignant neoplasm of bladder, unspecified: C67.9

## 2015-11-12 HISTORY — DX: Unspecified symptoms and signs involving the genitourinary system: R39.9

## 2015-11-12 HISTORY — PX: TRANSURETHRAL RESECTION OF BLADDER TUMOR: SHX2575

## 2015-11-12 LAB — POCT I-STAT, CHEM 8
BUN: 14 mg/dL (ref 6–20)
CALCIUM ION: 1.28 mmol/L (ref 1.13–1.30)
CHLORIDE: 98 mmol/L — AB (ref 101–111)
Creatinine, Ser: 0.9 mg/dL (ref 0.61–1.24)
Glucose, Bld: 102 mg/dL — ABNORMAL HIGH (ref 65–99)
HCT: 45 % (ref 39.0–52.0)
Hemoglobin: 15.3 g/dL (ref 13.0–17.0)
POTASSIUM: 3.6 mmol/L (ref 3.5–5.1)
SODIUM: 140 mmol/L (ref 135–145)
TCO2: 28 mmol/L (ref 0–100)

## 2015-11-12 SURGERY — TURBT (TRANSURETHRAL RESECTION OF BLADDER TUMOR)
Anesthesia: General

## 2015-11-12 MED ORDER — PROPOFOL 10 MG/ML IV BOLUS
INTRAVENOUS | Status: DC | PRN
  Administered 2015-11-12: 200 mg via INTRAVENOUS

## 2015-11-12 MED ORDER — HYDROMORPHONE HCL 1 MG/ML IJ SOLN
0.2500 mg | INTRAMUSCULAR | Status: DC | PRN
  Administered 2015-11-12 (×2): 0.5 mg via INTRAVENOUS
  Filled 2015-11-12: qty 1

## 2015-11-12 MED ORDER — DEXAMETHASONE SODIUM PHOSPHATE 10 MG/ML IJ SOLN
INTRAMUSCULAR | Status: AC
  Filled 2015-11-12: qty 1

## 2015-11-12 MED ORDER — ONDANSETRON HCL 4 MG/2ML IJ SOLN
4.0000 mg | Freq: Once | INTRAMUSCULAR | Status: AC
  Administered 2015-11-12: 4 mg via INTRAVENOUS
  Filled 2015-11-12: qty 2

## 2015-11-12 MED ORDER — CEFAZOLIN SODIUM-DEXTROSE 2-4 GM/100ML-% IV SOLN
2.0000 g | INTRAVENOUS | Status: AC
  Administered 2015-11-12: 2 g via INTRAVENOUS
  Filled 2015-11-12: qty 100

## 2015-11-12 MED ORDER — CEFAZOLIN SODIUM 1-5 GM-% IV SOLN
1.0000 g | INTRAVENOUS | Status: DC
  Filled 2015-11-12: qty 50

## 2015-11-12 MED ORDER — TRAMADOL HCL 50 MG PO TABS: 50.0000 mg | ORAL_TABLET | Freq: Four times a day (QID) | ORAL | Status: DC | PRN

## 2015-11-12 MED ORDER — PROPOFOL 10 MG/ML IV BOLUS
INTRAVENOUS | Status: AC
  Filled 2015-11-12: qty 40

## 2015-11-12 MED ORDER — LIDOCAINE HCL (CARDIAC) 20 MG/ML IV SOLN
INTRAVENOUS | Status: DC | PRN
  Administered 2015-11-12: 100 mg via INTRAVENOUS

## 2015-11-12 MED ORDER — PROMETHAZINE HCL 25 MG/ML IJ SOLN
6.2500 mg | INTRAMUSCULAR | Status: DC | PRN
  Filled 2015-11-12: qty 1

## 2015-11-12 MED ORDER — FENTANYL CITRATE (PF) 100 MCG/2ML IJ SOLN
INTRAMUSCULAR | Status: DC | PRN
  Administered 2015-11-12 (×2): 50 ug via INTRAVENOUS

## 2015-11-12 MED ORDER — CEFAZOLIN SODIUM-DEXTROSE 2-4 GM/100ML-% IV SOLN
INTRAVENOUS | Status: AC
  Filled 2015-11-12: qty 100

## 2015-11-12 MED ORDER — DEXAMETHASONE SODIUM PHOSPHATE 4 MG/ML IJ SOLN
INTRAMUSCULAR | Status: DC | PRN
  Administered 2015-11-12: 10 mg via INTRAVENOUS

## 2015-11-12 MED ORDER — FENTANYL CITRATE (PF) 100 MCG/2ML IJ SOLN
INTRAMUSCULAR | Status: AC
  Filled 2015-11-12: qty 2

## 2015-11-12 MED ORDER — ONDANSETRON HCL 4 MG/2ML IJ SOLN
INTRAMUSCULAR | Status: AC
  Filled 2015-11-12: qty 2

## 2015-11-12 MED ORDER — LIDOCAINE HCL (CARDIAC) 20 MG/ML IV SOLN
INTRAVENOUS | Status: AC
  Filled 2015-11-12: qty 5

## 2015-11-12 MED ORDER — LACTATED RINGERS IV SOLN
INTRAVENOUS | Status: DC
  Administered 2015-11-12 (×2): via INTRAVENOUS
  Filled 2015-11-12: qty 1000

## 2015-11-12 MED ORDER — NITROFURANTOIN MONOHYD MACRO 100 MG PO CAPS: 100.0000 mg | ORAL_CAPSULE | Freq: Every day | ORAL | Status: DC

## 2015-11-12 MED ORDER — HYDROMORPHONE HCL 1 MG/ML IJ SOLN
INTRAMUSCULAR | Status: AC
  Filled 2015-11-12: qty 1

## 2015-11-12 SURGICAL SUPPLY — 31 items
BAG DRAIN URO-CYSTO SKYTR STRL (DRAIN) ×3 IMPLANT
BAG URINE DRAINAGE (UROLOGICAL SUPPLIES) IMPLANT
BAG URINE LEG 19OZ MD ST LTX (BAG) IMPLANT
CANISTER SUCT LVC 12 LTR MEDI- (MISCELLANEOUS) IMPLANT
CATH FOLEY 2WAY SLVR  5CC 16FR (CATHETERS) ×2
CATH FOLEY 2WAY SLVR  5CC 20FR (CATHETERS)
CATH FOLEY 2WAY SLVR  5CC 22FR (CATHETERS)
CATH FOLEY 2WAY SLVR 5CC 16FR (CATHETERS) ×1 IMPLANT
CATH FOLEY 2WAY SLVR 5CC 20FR (CATHETERS) IMPLANT
CATH FOLEY 2WAY SLVR 5CC 22FR (CATHETERS) IMPLANT
CLOTH BEACON ORANGE TIMEOUT ST (SAFETY) ×3 IMPLANT
DRSG TELFA 3X8 NADH (GAUZE/BANDAGES/DRESSINGS) IMPLANT
ELECT BUTTON BIOP 24F 90D PLAS (MISCELLANEOUS) IMPLANT
ELECT LOOP 22F BIPOLAR SML (ELECTROSURGICAL) ×3
ELECT REM PT RETURN 9FT ADLT (ELECTROSURGICAL) ×3
ELECTRODE LOOP 22F BIPOLAR SML (ELECTROSURGICAL) ×1 IMPLANT
ELECTRODE REM PT RTRN 9FT ADLT (ELECTROSURGICAL) ×1 IMPLANT
EVACUATOR MICROVAS BLADDER (UROLOGICAL SUPPLIES) IMPLANT
GLOVE BIO SURGEON STRL SZ7.5 (GLOVE) ×3 IMPLANT
GOWN STRL REUS W/ TWL XL LVL3 (GOWN DISPOSABLE) ×1 IMPLANT
GOWN STRL REUS W/TWL XL LVL3 (GOWN DISPOSABLE) ×2
HOLDER FOLEY CATH W/STRAP (MISCELLANEOUS) IMPLANT
IV NS IRRIG 3000ML ARTHROMATIC (IV SOLUTION) ×6 IMPLANT
KIT ROOM TURNOVER WOR (KITS) ×3 IMPLANT
MANIFOLD NEPTUNE II (INSTRUMENTS) ×3 IMPLANT
PACK CYSTO (CUSTOM PROCEDURE TRAY) ×3 IMPLANT
PLUG CATH AND CAP STER (CATHETERS) IMPLANT
SET ASPIRATION TUBING (TUBING) IMPLANT
TUBE CONNECTING 12'X1/4 (SUCTIONS) ×1
TUBE CONNECTING 12X1/4 (SUCTIONS) ×2 IMPLANT
WATER STERILE IRR 3000ML UROMA (IV SOLUTION) ×3 IMPLANT

## 2015-11-12 NOTE — Op Note (Signed)
Preoperative diagnosis: Bladder wall thickening and erythema Postoperative diagnosis: Same  Procedure: Cystoscopy, TURBT 2-5 cm  Surgeon: Junious Silk  Anesthesia: Gen.  Indication for procedure: 70 year old white male with a history of high-grade TA disease and CIS with progressive bladder wall thickening and erythema posteriorly and up toward the dome. He was brought for biopsies today.  Findings: On exam under anesthesia the penis was circumcised and without mass or lesion. Testicles were descended bilaterally. Prostate on digital rectal exam was small and benign. There are no palpable abdominal masses.  On cystoscopy the urethra and the prostatic urethra were unremarkable. The trigone and ureteral orifices were normal. There was clear efflux. Posteriorly there was some mild erythema above this more dense erythema surrounding a prior biopsy site and up toward the dome a more thickened erythematous patch of about 3 cm noted on the CT. These 3 areas were biopsied with the cold cup and fulgurated.  Description of procedure: After consent was obtained patient brought the operating room. After adequate anesthesia his placed in lithotomy position. An exam under anesthesia was performed. He was prepped and draped in the usual sterile fashion. A timeout was performed to confirm the patient and procedure. The cystoscope was passed per urethra and the bladder examined with a 30 and 70 lens. Also the bladder neck and anterior were unremarkable and clear it should be noted. The rigid biopsy forceps were then used to biopsy posterior and then superior to this I biopsied. I then used the loop to fulgurate.  I tried to position the loop to resect the thickened area near the dome but the loop remain parallel to the tissue and I could not get what I felt to be a safe approach. I tried pressure on the suprapubic area as well as the scrub tech. Also tried various positions of the loop. Therefore a switch back to the  cold cup and was able to get some deep biopsies. I biopsied 3 areas of the 3 cm thickened area and was able to fulgurate the entire lesion. I then fulgurated and cleaned up the more inferior posterior lesions. This provided a good destruction of all the areas. Because the dome biopsies were rather deep I decided to leave a catheter. There was equal return of fluid and no obvious perforation. Hemostasis was excellent at low-pressure. The bladder was filled and the scope removed. A 16 French Foley was placed and left gravity drainage. I'll plan to leave it for a few days. He was awakened taken to recovery room in stable condition.  Complications: None  Blood loss: 5 mL  Specimens to pathology: #1 posterior #2 posterior superior #3 dome  Drains: 16 French Foley

## 2015-11-12 NOTE — Discharge Instructions (Signed)
Foley Catheter Care, Adult A Foley catheter is a soft, flexible tube. This tube is placed into your bladder to drain pee (urine). If you go home with this catheter in place, follow the instructions below.  REMOVE FOLEY as instructed Friday morning, November 15, 2015.   TAKING CARE OF THE CATHETER 1. Wash your hands with soap and water. 2. Put soap and water on a clean washcloth.  Clean the skin where the tube goes into your body.  Clean away from the tube site.  Never wipe toward the tube.  Clean the area using a circular motion.  Remove all the soap. Pat the area dry with a clean towel. For males, reposition the skin that covers the end of the penis (foreskin). 3. Attach the tube to your leg with tape or a leg strap. Do not stretch the tube tight. If you are using tape, remove any stickiness left behind by past tape you used. 4. Keep the drainage bag below your hips. Keep it off the floor. 5. Check your tube during the day. Make sure it is working and draining. Make sure the tube does not curl, twist, or bend. 6. Do not pull on the tube or try to take it out. TAKING CARE OF THE DRAINAGE BAGS You will have a large overnight drainage bag and a small leg bag. You may wear the overnight bag any time. Never wear the small bag at night. Follow the directions below. Emptying the Drainage Bag Empty your drainage bag when it is  - full or at least 2-3 times a day. 1. Wash your hands with soap and water. 2. Keep the drainage bag below your hips. 3. Hold the dirty bag over the toilet or clean container. 4. Open the pour spout at the bottom of the bag. Empty the pee into the toilet or container. Do not let the pour spout touch anything. 5. Clean the pour spout with a gauze pad or cotton ball that has rubbing alcohol on it. 6. Close the pour spout. 7. Attach the bag to your leg with tape or a leg strap. 8. Wash your hands well. Changing the Drainage Bag Change your bag once a month or sooner if  it starts to smell or look dirty.  1. Wash your hands with soap and water. 2. Pinch the rubber tube so that pee does not spill out. 3. Disconnect the catheter tube from the drainage tube at the connection valve. Do not let the tubes touch anything. 4. Clean the end of the catheter tube with an alcohol wipe. Clean the end of a the drainage tube with a different alcohol wipe. 5. Connect the catheter tube to the drainage tube of the clean drainage bag. 6. Attach the new bag to the leg with tape or a leg strap. Avoid attaching the new bag too tightly. 7. Wash your hands well. Cleaning the Drainage Bag 1. Wash your hands with soap and water. 2. Wash the bag in warm, soapy water. 3. Rinse the bag with warm water. 4. Fill the bag with a mixture of white vinegar and water (1 cup vinegar to 1 quart warm water [.2 liter vinegar to 1 liter warm water]). Close the bag and soak it for 30 minutes in the solution. 5. Rinse the bag with warm water. 6. Hang the bag to dry with the pour spout open and hanging downward. 7. Store the clean bag (once it is dry) in a clean plastic bag. 8. Wash your hands well.  PREVENT INFECTION  Wash your hands before and after touching your tube.  Take showers every day. Wash the skin where the tube enters your body. Do not take baths. Replace wet leg straps with dry ones, if this applies.  Do not use powders, sprays, or lotions on the genital area. Only use creams, lotions, or ointments as told by your doctor.  For females, wipe from front to back after going to the bathroom.  Drink enough fluids to keep your pee clear or pale yellow unless you are told not to have too much fluid (fluid restriction).  Do not let the drainage bag or tubing touch or lie on the floor.  Wear cotton underwear to keep the area dry. GET HELP IF:  Your pee is cloudy or smells unusually bad.  Your tube becomes clogged.  You are not draining pee into the bag or your bladder feels  full.  Your tube starts to leak. GET HELP RIGHT AWAY IF:  You have pain, puffiness (swelling), redness, or yellowish-white fluid (pus) where the tube enters the body.  You have pain in the belly (abdomen), legs, lower back, or bladder.  You have a fever.  You see blood fill the tube, or your pee is pink or red.  You feel sick to your stomach (nauseous), throw up (vomit), or have chills.  Your tube gets pulled out. MAKE SURE YOU:   Understand these instructions.  Will watch your condition.  Will get help right away if you are not doing well or get worse.   This information is not intended to replace advice given to you by your health care provider. Make sure you discuss any questions you have with your health care provider.   May remove catheter Friday-11/15/15 at home                                            Transurethral Procedure  Medications: Resume all your other meds from home  Activity: 1. No heavy lifting > 10 pounds for 2 weeks. 2. No sexual activity for 2 weeks. 3. No strenuous activity for 2 weeks. 4. No driving while on narcotic pain medications. 5. Drink plenty of water. 6. Continue to walk at home - you can still get blood clots when you are at home so keep     active but don't over do it. 7. Your urine may have some blood in it - make sure you drink plenty of water. Call or           come to the ER immediately if you catheter stops draining or you are unable to urinate.  Bathing: You can shower. You can take a bath unless you have a foley catheter in place.  Signs / Symptoms to call: 1. Call if you have a fever greater than 101.5  2. Uncontrolled nausea / vomiting, uncontrolled pain / dizziness, unable to urinate, leg         swelling / leg pain, or any other concerns.   You can reach [place] at [place] Instructions  Activity: Get plenty of rest for the remainder of the day. A responsible adult should stay with you for 24  hours following the procedure.  For the next 24 hours, DO NOT: -Drive a car -Paediatric nurse -Drink alcoholic beverages -Take any medication unless instructed by your physician -Make  any legal decisions or sign important papers.  Meals: Start with liquid foods such as gelatin or soup. Progress to regular foods as tolerated. Avoid greasy, spicy, heavy foods. If nausea and/or vomiting occur, drink only clear liquids until the nausea and/or vomiting subsides. Call your physician if vomiting continues.  Special Instructions/Symptoms: Your throat may feel dry or sore from the anesthesia or the breathing tube placed in your throat during surgery. If this causes discomfort, gargle with warm salt water. The discomfort should disappear within 24 hours.  If you had a scopolamine patch placed behind your ear for the management of post- operative nausea and/or vomiting:  1. The medication in the patch is effective for 72 hours, after which it should be removed.  Wrap patch in a tissue and discard in the trash. Wash hands thoroughly with soap and water. 2. You may remove the patch earlier than 72 hours if you experience unpleasant side effects which may include dry mouth, dizziness or visual disturbances. 3. Avoid touching the patch. Wash your hands with soap and water after contact with the patch.

## 2015-11-12 NOTE — Anesthesia Postprocedure Evaluation (Signed)
Anesthesia Post Note  Patient: Billy Rasmussen  Procedure(s) Performed: Procedure(s) (LRB): TRANSURETHRAL RESECTION OF BLADDER TUMOR (TURBT) (N/A)  Patient location during evaluation: PACU Anesthesia Type: General Level of consciousness: awake and alert Pain management: pain level controlled Vital Signs Assessment: post-procedure vital signs reviewed and stable Respiratory status: spontaneous breathing, nonlabored ventilation, respiratory function stable and patient connected to nasal cannula oxygen Cardiovascular status: blood pressure returned to baseline and stable Postop Assessment: no signs of nausea or vomiting Anesthetic complications: no    Last Vitals:  Filed Vitals:   11/12/15 1016 11/12/15 1038  BP: 143/83 119/70  Pulse: 66 87  Temp:  37 C  Resp: 24 16    Last Pain:  Filed Vitals:   11/12/15 1108  PainSc: 5                  Angelic Schnelle,JAMES TERRILL

## 2015-11-12 NOTE — Anesthesia Procedure Notes (Signed)
Procedure Name: LMA Insertion Date/Time: 11/12/2015 7:40 AM Performed by: Bethena Roys T Pre-anesthesia Checklist: Patient identified, Emergency Drugs available, Suction available and Patient being monitored Patient Re-evaluated:Patient Re-evaluated prior to inductionOxygen Delivery Method: Circle system utilized Preoxygenation: Pre-oxygenation with 100% oxygen Intubation Type: IV induction Ventilation: Mask ventilation without difficulty LMA: LMA inserted LMA Size: 5.0 Number of attempts: 1 Airway Equipment and Method: Bite block Placement Confirmation: positive ETCO2 Dental Injury: Teeth and Oropharynx as per pre-operative assessment

## 2015-11-12 NOTE — Transfer of Care (Signed)
Immediate Anesthesia Transfer of Care Note  Patient: Billy Rasmussen  Procedure(s) Performed: Procedure(s): TRANSURETHRAL RESECTION OF BLADDER TUMOR (TURBT) (N/A)  Patient Location: PACU  Anesthesia Type:General  Level of Consciousness: awake, alert  and oriented  Airway & Oxygen Therapy: Patient Spontanous Breathing and Patient connected to nasal cannula oxygen  Post-op Assessment: Report given to RN  Post vital signs: Reviewed and stable  Last Vitals:160/82, 73, 16, 100%  Filed Vitals:   11/12/15 0628 11/12/15 0834  BP: 140/77   Pulse: 77   Temp: 37.2 C 36.8 C  Resp: 18     Last Pain: There were no vitals filed for this visit.    Patients Stated Pain Goal: 5 ([id number])  Complications: No apparent anesthesia complications

## 2015-11-12 NOTE — Interval H&P Note (Signed)
History and Physical Interval Note:  11/12/2015 7:32 AM  Billy Rasmussen  has presented today for surgery, with the diagnosis of BLADDER CANCER   The various methods of treatment have been discussed with the patient and family. After consideration of risks, benefits and other options for treatment, the patient has consented to  Procedure(s): TRANSURETHRAL RESECTION OF BLADDER TUMOR (TURBT) (N/A) as a surgical intervention .  The patient's history has been reviewed, patient examined, no change in status, stable for surgery.  I have reviewed the patient's chart and labs.  Questions were answered to the patient's satisfaction.     Andriea Hasegawa

## 2015-11-13 ENCOUNTER — Encounter (HOSPITAL_BASED_OUTPATIENT_CLINIC_OR_DEPARTMENT_OTHER): Payer: Self-pay | Admitting: Urology

## 2015-11-26 DIAGNOSIS — R350 Frequency of micturition: Secondary | ICD-10-CM | POA: Diagnosis not present

## 2015-11-26 DIAGNOSIS — C674 Malignant neoplasm of posterior wall of bladder: Secondary | ICD-10-CM | POA: Diagnosis not present

## 2015-11-28 DIAGNOSIS — I10 Essential (primary) hypertension: Secondary | ICD-10-CM | POA: Diagnosis not present

## 2015-11-28 DIAGNOSIS — D229 Melanocytic nevi, unspecified: Secondary | ICD-10-CM | POA: Diagnosis not present

## 2015-11-28 DIAGNOSIS — R21 Rash and other nonspecific skin eruption: Secondary | ICD-10-CM | POA: Diagnosis not present

## 2015-12-04 DIAGNOSIS — Z96611 Presence of right artificial shoulder joint: Secondary | ICD-10-CM | POA: Diagnosis not present

## 2015-12-04 DIAGNOSIS — Z471 Aftercare following joint replacement surgery: Secondary | ICD-10-CM | POA: Diagnosis not present

## 2015-12-04 DIAGNOSIS — Z96612 Presence of left artificial shoulder joint: Secondary | ICD-10-CM | POA: Diagnosis not present

## 2015-12-18 DIAGNOSIS — L918 Other hypertrophic disorders of the skin: Secondary | ICD-10-CM | POA: Diagnosis not present

## 2015-12-18 DIAGNOSIS — L821 Other seborrheic keratosis: Secondary | ICD-10-CM | POA: Diagnosis not present

## 2015-12-18 DIAGNOSIS — D235 Other benign neoplasm of skin of trunk: Secondary | ICD-10-CM | POA: Diagnosis not present

## 2015-12-18 DIAGNOSIS — L814 Other melanin hyperpigmentation: Secondary | ICD-10-CM | POA: Diagnosis not present

## 2015-12-18 DIAGNOSIS — D1801 Hemangioma of skin and subcutaneous tissue: Secondary | ICD-10-CM | POA: Diagnosis not present

## 2016-01-06 ENCOUNTER — Telehealth: Payer: Self-pay | Admitting: Physician Assistant

## 2016-01-06 DIAGNOSIS — E785 Hyperlipidemia, unspecified: Secondary | ICD-10-CM | POA: Diagnosis not present

## 2016-01-06 DIAGNOSIS — Z125 Encounter for screening for malignant neoplasm of prostate: Secondary | ICD-10-CM | POA: Diagnosis not present

## 2016-01-06 DIAGNOSIS — Z0000 Encounter for general adult medical examination without abnormal findings: Secondary | ICD-10-CM | POA: Diagnosis not present

## 2016-01-06 NOTE — Telephone Encounter (Signed)
Pt has uri Billy Rasmussen pt appt scheduled

## 2016-01-07 ENCOUNTER — Ambulatory Visit (INDEPENDENT_AMBULATORY_CARE_PROVIDER_SITE_OTHER): Payer: Medicare Other | Admitting: Family

## 2016-01-07 ENCOUNTER — Encounter: Payer: Self-pay | Admitting: Family

## 2016-01-07 VITALS — BP 145/88 | HR 71 | Temp 97.8°F | Ht 66.0 in | Wt 239.0 lb

## 2016-01-07 DIAGNOSIS — E669 Obesity, unspecified: Secondary | ICD-10-CM | POA: Insufficient documentation

## 2016-01-07 DIAGNOSIS — J208 Acute bronchitis due to other specified organisms: Secondary | ICD-10-CM

## 2016-01-07 DIAGNOSIS — J309 Allergic rhinitis, unspecified: Secondary | ICD-10-CM

## 2016-01-07 DIAGNOSIS — C674 Malignant neoplasm of posterior wall of bladder: Secondary | ICD-10-CM | POA: Diagnosis not present

## 2016-01-07 MED ORDER — PREDNISONE 10 MG (21) PO TBPK: ORAL_TABLET | ORAL | 0 refills | Status: DC

## 2016-01-07 MED ORDER — BENZONATATE 200 MG PO CAPS: 200.0000 mg | ORAL_CAPSULE | Freq: Three times a day (TID) | ORAL | 1 refills | Status: DC | PRN

## 2016-01-07 MED ORDER — FLUTICASONE PROPIONATE 50 MCG/ACT NA SUSP: 2.0000 | Freq: Every day | NASAL | 6 refills | Status: DC

## 2016-01-07 NOTE — Patient Instructions (Signed)
Acute Bronchitis Bronchitis is inflammation of the airways that extend from the windpipe into the lungs (bronchi). The inflammation often causes mucus to develop. This leads to a cough, which is the most common symptom of bronchitis.  In acute bronchitis, the condition usually develops suddenly and goes away over time, usually in a couple weeks. Smoking, allergies, and asthma can make bronchitis worse. Repeated episodes of bronchitis may cause further lung problems.  CAUSES Acute bronchitis is most often caused by the same virus that causes a cold. The virus can spread from person to person (contagious) through coughing, sneezing, and touching contaminated objects. SIGNS AND SYMPTOMS   Cough.   Fever.   Coughing up mucus.   Body aches.   Chest congestion.   Chills.   Shortness of breath.   Sore throat.  DIAGNOSIS  Acute bronchitis is usually diagnosed through a physical exam. Your health care provider will also ask you questions about your medical history. Tests, such as chest X-rays, are sometimes done to rule out other conditions.  TREATMENT  Acute bronchitis usually goes away in a couple weeks. Oftentimes, no medical treatment is necessary. Medicines are sometimes given for relief of fever or cough. Antibiotic medicines are usually not needed but may be prescribed in certain situations. In some cases, an inhaler may be recommended to help reduce shortness of breath and control the cough. A cool mist vaporizer may also be used to help thin bronchial secretions and make it easier to clear the chest.  HOME CARE INSTRUCTIONS  Get plenty of rest.   Drink enough fluids to keep your urine clear or pale yellow (unless you have a medical condition that requires fluid restriction). Increasing fluids may help thin your respiratory secretions (sputum) and reduce chest congestion, and it will prevent dehydration.   Take medicines only as directed by your health care provider.  If  you were prescribed an antibiotic medicine, finish it all even if you start to feel better.  Avoid smoking and secondhand smoke. Exposure to cigarette smoke or irritating chemicals will make bronchitis worse. If you are a smoker, consider using nicotine gum or skin patches to help control withdrawal symptoms. Quitting smoking will help your lungs heal faster.   Reduce the chances of another bout of acute bronchitis by washing your hands frequently, avoiding people with cold symptoms, and trying not to touch your hands to your mouth, nose, or eyes.   Keep all follow-up visits as directed by your health care provider.  SEEK MEDICAL CARE IF: Your symptoms do not improve after 1 week of treatment.  SEEK IMMEDIATE MEDICAL CARE IF:  You develop an increased fever or chills.   You have chest pain.   You have severe shortness of breath.  You have bloody sputum.   You develop dehydration.  You faint or repeatedly feel like you are going to pass out.  You develop repeated vomiting.  You develop a severe headache. MAKE SURE YOU:   Understand these instructions.  Will watch your condition.  Will get help right away if you are not doing well or get worse.   This information is not intended to replace advice given to you by your health care provider. Make sure you discuss any questions you have with your health care provider.   Document Released: 06/25/2004 Document Revised: 06/08/2014 Document Reviewed: 11/08/2012 Elsevier Interactive Patient Education 2016 [employer]  - Take meds as prescribed - Use a cool mist humidifier  -Use saline nose sprays frequently -Saline   irrigations of the nose can be very helpful if done frequently.  * 4X daily for 1 week*  * Use of a nettie pot can be helpful with this. Follow directions with this* -Force fluids -For any cough or congestion  Use plain Mucinex- regular strength or max strength is fine   * Children- consult with Pharmacist for  dosing -For fever or aces or pains- take tylenol or ibuprofen appropriate for age and weight.  * for fevers greater than 101 orally you may alternate ibuprofen and tylenol every  3 hours. -Throat lozenges if help    Koreen Lizaola, [name]  

## 2016-01-07 NOTE — Progress Notes (Signed)
Subjective:    Patient ID: Billy Rasmussen, male    DOB: September 14, 1945, 70 y.o.   MRN: [id number]  URI   This is a new problem. The current episode started 1 to 4 weeks ago. The problem has been unchanged. There has been no fever. Associated symptoms include congestion, coughing, rhinorrhea, sinus pain, sneezing and a sore throat. Pertinent negatives include no dysuria, ear pain, headaches, nausea, vomiting or wheezing. He has tried decongestant for the symptoms. The treatment provided mild relief.  Cough  The current episode started 1 to 4 weeks ago. The problem has been unchanged. The problem occurs every few minutes. The cough is productive of purulent sputum. Associated symptoms include rhinorrhea and a sore throat. Pertinent negatives include no ear pain, headaches or wheezing.      Review of Systems  HENT: Positive for congestion, rhinorrhea, sneezing and sore throat. Negative for ear pain.   Respiratory: Positive for cough. Negative for wheezing.   Gastrointestinal: Negative for nausea and vomiting.  Genitourinary: Negative for dysuria.  Neurological: Negative for headaches.  All other systems reviewed and are negative.      Objective:   Physical Exam  Constitutional: He is oriented to person, place, and time. He appears well-developed and well-nourished. No distress.  HENT:  Head: Normocephalic.  Right Ear: External ear normal.  Left Ear: External ear normal.  Nose: Mucosal edema and rhinorrhea present.  Mouth/Throat: Oropharynx is clear and moist.  Eyes: Pupils are equal, round, and reactive to light. Right eye exhibits no discharge. Left eye exhibits no discharge.  Neck: Normal range of motion. Neck supple. No thyromegaly present.  Cardiovascular: Normal rate, regular rhythm, normal heart sounds and intact distal pulses.   No murmur heard. Pulmonary/Chest: Effort normal and breath sounds normal. No respiratory distress. He has no wheezes.  Abdominal: Soft. Bowel sounds  are normal. He exhibits no distension. There is no tenderness.  Musculoskeletal: Normal range of motion. He exhibits no edema or tenderness.  Neurological: He is alert and oriented to person, place, and time. He has normal reflexes. No cranial nerve deficit.  Skin: Skin is warm and dry. No rash noted. No erythema.  Psychiatric: He has a normal mood and affect. His behavior is normal. Judgment and thought content normal.  Vitals reviewed.     BP (!) 145/88 (BP Location: Left Arm, Patient Position: Sitting, Cuff Size: Normal)   Pulse 71   Temp 97.8 F (36.6 C) (Oral)   Ht 5\' 6"  (1.676 m)   Wt 239 lb (108.4 kg)   BMI 38.58 kg/m      Assessment & Plan:  1. Obesity (BMI 30-39.9)  2. Allergic rhinitis, unspecified allergic rhinitis type - fluticasone (FLONASE) 50 MCG/ACT nasal spray; Place 2 sprays into both nostrils daily.  Dispense: 16 g; Refill: 6  3. Viral bronchitis -- Take meds as prescribed - Use a cool mist humidifier  -Use saline nose sprays frequently -Saline irrigations of the nose can be very helpful if done frequently.  * 4X daily for 1 week*  * Use of a nettie pot can be helpful with this. Follow directions with this* -Force fluids -For any cough or congestion  Use plain Mucinex- regular strength or max strength is fine   * Children- consult with Pharmacist for dosing -For fever or aces or pains- take tylenol or ibuprofen appropriate for age and weight.  * for fevers greater than 101 orally you may alternate ibuprofen and tylenol every  3 hours. -Throat lozenges  if help - fluticasone (FLONASE) 50 MCG/ACT nasal spray; Place 2 sprays into both nostrils daily.  Dispense: 16 g; Refill: 6 - benzonatate (TESSALON) 200 MG capsule; Take 1 capsule (200 mg total) by mouth 3 (three) times daily as needed.  Dispense: 30 capsule; Refill: 1 - predniSONE (STERAPRED UNI-PAK 21 TAB) 10 MG (21) TBPK tablet; Use as directed  Dispense: 21 tablet; Refill: 0  Evelina Dun, [name]

## 2016-01-14 DIAGNOSIS — C674 Malignant neoplasm of posterior wall of bladder: Secondary | ICD-10-CM | POA: Diagnosis not present

## 2016-01-14 DIAGNOSIS — R8271 Bacteriuria: Secondary | ICD-10-CM | POA: Diagnosis not present

## 2016-01-14 DIAGNOSIS — N3000 Acute cystitis without hematuria: Secondary | ICD-10-CM | POA: Diagnosis not present

## 2016-01-20 DIAGNOSIS — M25561 Pain in right knee: Secondary | ICD-10-CM | POA: Diagnosis not present

## 2016-01-21 DIAGNOSIS — C674 Malignant neoplasm of posterior wall of bladder: Secondary | ICD-10-CM | POA: Diagnosis not present

## 2016-01-21 DIAGNOSIS — Z5111 Encounter for antineoplastic chemotherapy: Secondary | ICD-10-CM | POA: Diagnosis not present

## 2016-01-28 DIAGNOSIS — C674 Malignant neoplasm of posterior wall of bladder: Secondary | ICD-10-CM | POA: Diagnosis not present

## 2016-01-28 DIAGNOSIS — Z5111 Encounter for antineoplastic chemotherapy: Secondary | ICD-10-CM | POA: Diagnosis not present

## 2016-01-28 DIAGNOSIS — C678 Malignant neoplasm of overlapping sites of bladder: Secondary | ICD-10-CM | POA: Diagnosis not present

## 2016-02-12 DIAGNOSIS — M1711 Unilateral primary osteoarthritis, right knee: Secondary | ICD-10-CM | POA: Diagnosis not present

## 2016-02-12 DIAGNOSIS — M25561 Pain in right knee: Secondary | ICD-10-CM | POA: Diagnosis not present

## 2016-02-25 ENCOUNTER — Ambulatory Visit (INDEPENDENT_AMBULATORY_CARE_PROVIDER_SITE_OTHER): Payer: Medicare Other | Admitting: Family Medicine

## 2016-02-25 ENCOUNTER — Encounter: Payer: Self-pay | Admitting: Family Medicine

## 2016-02-25 ENCOUNTER — Ambulatory Visit (INDEPENDENT_AMBULATORY_CARE_PROVIDER_SITE_OTHER): Payer: Medicare Other

## 2016-02-25 VITALS — BP 129/80 | HR 79 | Temp 98.3°F | Ht 66.0 in

## 2016-02-25 DIAGNOSIS — W57XXXA Bitten or stung by nonvenomous insect and other nonvenomous arthropods, initial encounter: Secondary | ICD-10-CM

## 2016-02-25 DIAGNOSIS — Z23 Encounter for immunization: Secondary | ICD-10-CM

## 2016-02-25 DIAGNOSIS — Z0000 Encounter for general adult medical examination without abnormal findings: Secondary | ICD-10-CM | POA: Insufficient documentation

## 2016-02-25 DIAGNOSIS — Z01818 Encounter for other preprocedural examination: Secondary | ICD-10-CM

## 2016-02-25 DIAGNOSIS — R21 Rash and other nonspecific skin eruption: Secondary | ICD-10-CM

## 2016-02-25 DIAGNOSIS — I10 Essential (primary) hypertension: Secondary | ICD-10-CM | POA: Insufficient documentation

## 2016-02-25 DIAGNOSIS — T148 Other injury of unspecified body region: Secondary | ICD-10-CM | POA: Diagnosis not present

## 2016-02-25 MED ORDER — TRIAMCINOLONE ACETONIDE 0.1 % EX CREA: 1.0000 "application " | TOPICAL_CREAM | Freq: Two times a day (BID) | CUTANEOUS | 0 refills | Status: DC

## 2016-02-25 NOTE — Patient Instructions (Signed)
Great to meet you!  Lets see you again in 3-4 months  We will send your labs on mychart or call within 1 week

## 2016-02-25 NOTE — Progress Notes (Signed)
   HPI  Patient presents today here for pre-op clearance and for rash  Rash is present for 2 months, described as very itchy rash on bilateral distal extremities, primarily the legs, less obvious on the arms.  He's had several take Biaxin is a little bit worried about Lyme disease. He is family history of lung disease. No new joint pains, however he has history of severe polyarthralgia several joint replacements.  Hypertension Well-controlled with lisinopril/HCTZ. No chest pain, dyspnea, palpitations, leg edema.  Patient is scheduled for right knee replacement. He is excited to have increased mobility.  He can easily walk a half a mile without any increased dyspnea. He is only limited by knee pain. Also walk up a flight of stairs without increased dyspnea.   PMH: Smoking status noted ROS: Per HPI  Objective: BP 129/80   Pulse 79   Temp 98.3 F (36.8 C) (Oral)   Ht _0  (1.676 m)  Gen: NAD, alert, cooperative with exam HEENT: NCAT, EOMI, PERRL CV: RRR, good S1/S2, no murmur Resp: CTABL, no wheezes, non-labored Ext: No edema, warm Neuro: Alert and oriented, EOMI, PERRLA Skin:  Well-healed scar on the left anterior knee  Assessment and plan:  # Osteoarthritis of the right knee, preoperative clearance Patient scheduled for total knee arthroplasty Can easily perform formats of activities, he has average risk for general anesthesia. Chest x-ray and EKG reviewed and are normal. Basic lab work.  # Rash Unclear etiology, given steroid ointment for symptomatically relief Given several take bite exposures will check for alignment RMSF.      Orders Placed This Encounter  Procedures  . DG Chest 2 View    Standing Status:   Future    Standing Expiration Date:   04/26/2017    Order Specific Question:   Reason for Exam (SYMPTOM  OR DIAGNOSIS REQUIRED)    Answer:   surgical clearance    Order Specific Question:   Preferred imaging location?    Answer:   Internal  .  Lyme Ab/Western Blot Reflex  . [place] mtn spotted fvr abs pnl(IgG+IgM)  . CBC with Differential  . CMP14+EGFR  . LDL Cholesterol, Direct  . Hepatitis C antibody  . EKG 12-Lead    Meds ordered this encounter  Medications  . triamcinolone cream (KENALOG) 0.1 %    Sig: Apply 1 application topically 2 (two) times daily.    Dispense:  30 g    Refill:  [place], MD [facility] 02/25/2016, 1:45 PM

## 2016-02-26 LAB — CMP14+EGFR
ALK PHOS: 55 IU/L (ref 39–117)
ALT: 55 IU/L — AB (ref 0–44)
AST: 64 IU/L — ABNORMAL HIGH (ref 0–40)
Albumin/Globulin Ratio: 1.8 (ref 1.2–2.2)
Albumin: 4.6 g/dL (ref 3.6–4.8)
BILIRUBIN TOTAL: 0.7 mg/dL (ref 0.0–1.2)
BUN/Creatinine Ratio: 16 (ref 10–24)
BUN: 14 mg/dL (ref 8–27)
CHLORIDE: 98 mmol/L (ref 96–106)
CO2: 26 mmol/L (ref 18–29)
Calcium: 9.7 mg/dL (ref 8.6–10.2)
Creatinine, Ser: 0.85 mg/dL (ref 0.76–1.27)
GFR calc Af Amer: 103 mL/min/{1.73_m2} (ref >59)
GFR calc non Af Amer: 90 mL/min/{1.73_m2} (ref >59)
GLUCOSE: 121 mg/dL — AB (ref 65–99)
Globulin, Total: 2.5 g/dL (ref 1.5–4.5)
Potassium: 4 mmol/L (ref 3.5–5.2)
Sodium: 139 mmol/L (ref 134–144)
Total Protein: 7.1 g/dL (ref 6.0–8.5)

## 2016-02-26 LAB — LYME AB/WESTERN BLOT REFLEX: LYME DISEASE AB, QUANT, IGM: <0.8 index (ref 0.00–0.79)

## 2016-02-26 LAB — CBC WITH DIFFERENTIAL/PLATELET
BASOS ABS: 0 10*3/uL (ref 0.0–0.2)
Basos: 0 %
EOS (ABSOLUTE): 0.2 10*3/uL (ref 0.0–0.4)
Eos: 3 %
Hematocrit: 41.7 % (ref 37.5–51.0)
Hemoglobin: 14.2 g/dL (ref 12.6–17.7)
IMMATURE GRANULOCYTES: 0 %
Immature Grans (Abs): 0 10*3/uL (ref 0.0–0.1)
Lymphocytes Absolute: 1.3 10*3/uL (ref 0.7–3.1)
Lymphs: 21 %
MCH: 31.2 pg (ref 26.6–33.0)
MCHC: 34.1 g/dL (ref 31.5–35.7)
MCV: 92 fL (ref 79–97)
MONOCYTES: 8 %
Monocytes Absolute: 0.5 10*3/uL (ref 0.1–0.9)
NEUTROS PCT: 68 %
Neutrophils Absolute: 4.3 10*3/uL (ref 1.4–7.0)
PLATELETS: 136 10*3/uL — AB (ref 150–379)
RBC: 4.55 x10E6/uL (ref 4.14–5.80)
RDW: 14 % (ref 12.3–15.4)
WBC: 6.3 10*3/uL (ref 3.4–10.8)

## 2016-02-26 LAB — ROCKY MTN SPOTTED FVR ABS PNL(IGG+IGM)
RMSF IGM: 0.25 {index} (ref 0.00–0.89)
RMSF IgG: NEGATIVE

## 2016-02-26 LAB — LDL CHOLESTEROL, DIRECT: LDL DIRECT: 96 mg/dL (ref 0–99)

## 2016-02-26 LAB — HEPATITIS C ANTIBODY

## 2016-02-28 DIAGNOSIS — M1711 Unilateral primary osteoarthritis, right knee: Secondary | ICD-10-CM | POA: Diagnosis not present

## 2016-03-18 ENCOUNTER — Encounter (HOSPITAL_COMMUNITY)
Admission: RE | Admit: 2016-03-18 | Discharge: 2016-03-18 | Disposition: A | Discharge status: Home / Self Care | Payer: Medicare Other | Source: Ambulatory Visit | Attending: Orthopedic Surgery | Admitting: Orthopedic Surgery

## 2016-03-18 ENCOUNTER — Encounter (HOSPITAL_COMMUNITY): Payer: Self-pay

## 2016-03-18 DIAGNOSIS — M1711 Unilateral primary osteoarthritis, right knee: Secondary | ICD-10-CM | POA: Diagnosis not present

## 2016-03-18 DIAGNOSIS — Z01818 Encounter for other preprocedural examination: Secondary | ICD-10-CM | POA: Diagnosis not present

## 2016-03-18 HISTORY — DX: Frequency of micturition: R35.0

## 2016-03-18 HISTORY — DX: Personal history of other medical treatment: Z92.89

## 2016-03-18 HISTORY — DX: Personal history of other diseases of the respiratory system: Z87.09

## 2016-03-18 LAB — CBC
HCT: 42.7 % (ref 39.0–52.0)
HEMOGLOBIN: 14.4 g/dL (ref 13.0–17.0)
MCH: 31.2 pg (ref 26.0–34.0)
MCHC: 33.7 g/dL (ref 30.0–36.0)
MCV: 92.6 fL (ref 78.0–100.0)
Platelets: 150 10*3/uL (ref 150–400)
RBC: 4.61 MIL/uL (ref 4.22–5.81)
RDW: 13.4 % (ref 11.5–15.5)
WBC: 5.9 10*3/uL (ref 4.0–10.5)

## 2016-03-18 LAB — BASIC METABOLIC PANEL
ANION GAP: 7 (ref 5–15)
BUN: 17 mg/dL (ref 6–20)
CALCIUM: 9.7 mg/dL (ref 8.9–10.3)
CHLORIDE: 100 mmol/L — AB (ref 101–111)
CO2: 32 mmol/L (ref 22–32)
Creatinine, Ser: 0.96 mg/dL (ref 0.61–1.24)
GFR calc non Af Amer: >60 mL/min (ref >60)
Glucose, Bld: 104 mg/dL — ABNORMAL HIGH (ref 65–99)
POTASSIUM: 4 mmol/L (ref 3.5–5.1)
Sodium: 139 mmol/L (ref 135–145)

## 2016-03-18 LAB — SURGICAL PCR SCREEN
MRSA, PCR: NEGATIVE
Staphylococcus aureus: NEGATIVE

## 2016-03-18 NOTE — Patient Instructions (Signed)
Billy Rasmussen  03/18/2016   Your procedure is scheduled on: Monday March 30, 2016  Report to [facility] [place] [facility]  Entrance take [place]  elevators to 3rd floor to  [place] at 12:30 PM.  Call this number if you have problems the morning of surgery [phone]   Remember: ONLY 1 PERSON MAY GO WITH YOU TO SHORT STAY TO GET  READY MORNING OF [place].  Do not eat food After Midnight but may take clear liquids till 8:30 am day of surgery then nothing by mouth.      Take these medicines the morning of surgery: NONE                                You may not have any metal on your body including hair pins and              piercings  Do not wear jewelry, lotions, powders or colognes, deodorant                     Men may shave face and neck.   Do not bring valuables to the hospital. [place].  Contacts, dentures or bridgework may not be worn into surgery.  Leave suitcase in the car. After surgery it may be brought to your room.                Please read over the following fact sheets you were given:MRSA INFORMATION SHEET; INCENTIVE SPIROMETER; BLOOD TRANSFUSION INFORMATION SHEET  _____________________________________________________________________             [facility] - Preparing for Surgery Before surgery, you can play an important role.  Because skin is not sterile, your skin needs to be as free of germs as possible.  You can reduce the number of germs on your skin by washing with CHG (chlorahexidine gluconate) soap before surgery.  CHG is an antiseptic cleaner which kills germs and bonds with the skin to continue killing germs even after washing. Please DO NOT use if you have an allergy to CHG or antibacterial soaps.  If your skin becomes reddened/irritated stop using the CHG and inform your nurse when you arrive at Short Stay. Do not shave (including legs and underarms) for at least 48 hours  prior to the first CHG shower.  You may shave your face/neck. Please follow these instructions carefully:  1.  Shower with CHG Soap the night before surgery and the  morning of Surgery.  2.  If you choose to wash your hair, wash your hair first as usual with your  normal  shampoo.  3.  After you shampoo, rinse your hair and body thoroughly to remove the  shampoo.                           4.  Use CHG as you would any other liquid soap.  You can apply chg directly  to the skin and wash                       Gently with a scrungie or clean washcloth.  5.  Apply the CHG Soap to your body  ONLY FROM THE NECK DOWN.   Do not use on face/ open                           Wound or open sores. Avoid contact with eyes, ears mouth and genitals (private parts).                       Wash face,  Genitals (private parts) with your normal soap.             6.  Wash thoroughly, paying special attention to the area where your surgery  will be performed.  7.  Thoroughly rinse your body with warm water from the neck down.  8.  DO NOT shower/wash with your normal soap after using and rinsing off  the CHG Soap.                9.  Pat yourself dry with a clean towel.            10.  Wear clean pajamas.            11.  Place clean sheets on your bed the night of your first shower and do not  sleep with pets. Day of Surgery : Do not apply any lotions/deodorants the morning of surgery.  Please wear clean clothes to the hospital/surgery center.  FAILURE TO FOLLOW THESE INSTRUCTIONS MAY RESULT IN THE CANCELLATION OF YOUR SURGERY PATIENT SIGNATURE_________________________________  NURSE SIGNATURE__________________________________  ________________________________________________________________________    CLEAR LIQUID DIET   Foods Allowed                                                                     Foods Excluded  Coffee and tea, regular and decaf                             liquids that you cannot  Plain  Jell-O in any flavor                                             see through such as: Fruit ices (not with fruit pulp)                                     milk, soups, orange juice  Iced Popsicles                                    All solid food Carbonated beverages, regular and diet                                    Cranberry, grape and apple juices Sports drinks like Gatorade Lightly seasoned clear broth or consume(fat free) Sugar, honey syrup  Sample Menu Breakfast  Lunch                                     Supper Cranberry juice                    Beef broth                            Chicken broth Jell-O                                     Grape juice                           Apple juice Coffee or tea                        Jell-O                                      Popsicle                                                Coffee or tea                        Coffee or tea  _____________________________________________________________________    Incentive Spirometer  An incentive spirometer is a tool that can help keep your lungs clear and active. This tool measures how well you are filling your lungs with each breath. Taking long deep breaths may help reverse or decrease the chance of developing breathing (pulmonary) problems (especially infection) following:  A long period of time when you are unable to move or be active. BEFORE THE PROCEDURE   If the spirometer includes an indicator to show your best effort, your nurse or respiratory therapist will set it to a desired goal.  If possible, sit up straight or lean slightly forward. Try not to slouch.  Hold the incentive spirometer in an upright position. INSTRUCTIONS FOR USE  1. Sit on the edge of your bed if possible, or sit up as far as you can in bed or on a chair. 2. Hold the incentive spirometer in an upright position. 3. Breathe out normally. 4. Place the mouthpiece in your mouth and seal  your lips tightly around it. 5. Breathe in slowly and as deeply as possible, raising the piston or the ball toward the top of the column. 6. Hold your breath for 3-5 seconds or for as long as possible. Allow the piston or ball to fall to the bottom of the column. 7. Remove the mouthpiece from your mouth and breathe out normally. 8. Rest for a few seconds and repeat Steps 1 through 7 at least 10 times every 1-2 hours when you are awake. Take your time and take a few normal breaths between deep breaths. 9. The spirometer may include an indicator to show your best effort. Use the indicator as a goal to work toward during each repetition. 10. After each set of 10 deep breaths,  practice coughing to be sure your lungs are clear. If you have an incision (the cut made at the time of surgery), support your incision when coughing by placing a pillow or rolled up towels firmly against it. Once you are able to get out of bed, walk around indoors and cough well. You may stop using the incentive spirometer when instructed by your caregiver.  RISKS AND COMPLICATIONS  Take your time so you do not get dizzy or light-headed.  If you are in pain, you may need to take or ask for pain medication before doing incentive spirometry. It is harder to take a deep breath if you are having pain. AFTER USE  Rest and breathe slowly and easily.  It can be helpful to keep track of a log of your progress. Your caregiver can provide you with a simple table to help with this. If you are using the spirometer at home, follow these instructions: [place] IF:   You are having difficultly using the spirometer.  You have trouble using the spirometer as often as instructed.  Your pain medication is not giving enough relief while using the spirometer.  You develop fever of 100.5 F (38.1 C) or higher. SEEK IMMEDIATE MEDICAL CARE IF:   You cough up bloody sputum that had not been present before.  You develop fever of  102 F (38.9 C) or greater.  You develop worsening pain at or near the incision site. MAKE SURE YOU:   Understand these instructions.  Will watch your condition.  Will get help right away if you are not doing well or get worse. Document Released: 09/28/2006 Document Revised: 08/10/2011 Document Reviewed: 11/29/2006 ExitCare Patient Information 2014 [employer], [place].   ________________________________________________________________________  WHAT IS A BLOOD TRANSFUSION? Blood Transfusion Information  A transfusion is the replacement of blood or some of its parts. Blood is made up of multiple cells which provide different functions.  Red blood cells carry oxygen and are used for blood loss replacement.  White blood cells fight against infection.  Platelets control bleeding.  Plasma helps clot blood.  Other blood products are available for specialized needs, such as hemophilia or other clotting disorders. BEFORE THE TRANSFUSION  Who gives blood for transfusions?   Healthy volunteers who are fully evaluated to make sure their blood is safe. This is blood bank blood. Transfusion therapy is the safest it has ever been in the practice of medicine. Before blood is taken from a donor, a complete history is taken to make sure that person has no history of diseases nor engages in risky social behavior (examples are intravenous drug use or sexual activity with multiple partners). The donor's travel history is screened to minimize risk of transmitting infections, such as malaria. The donated blood is tested for signs of infectious diseases, such as HIV and hepatitis. The blood is then tested to be sure it is compatible with you in order to minimize the chance of a transfusion reaction. If you or a relative donates blood, this is often done in anticipation of surgery and is not appropriate for emergency situations. It takes many days to process the donated blood. RISKS AND COMPLICATIONS Although  transfusion therapy is very safe and saves many lives, the main dangers of transfusion include:   Getting an infectious disease.  Developing a transfusion reaction. This is an allergic reaction to something in the blood you were given. Every precaution is taken to prevent this. The decision to have a blood transfusion has been  considered carefully by your caregiver before blood is given. Blood is not given unless the benefits outweigh the risks. AFTER THE TRANSFUSION  Right after receiving a blood transfusion, you will usually feel much better and more energetic. This is especially true if your red blood cells have gotten low (anemic). The transfusion raises the level of the red blood cells which carry oxygen, and this usually causes an energy increase.  The nurse administering the transfusion will monitor you carefully for complications. HOME CARE INSTRUCTIONS  No special instructions are needed after a transfusion. You may find your energy is better. Speak with your caregiver about any limitations on activity for underlying diseases you may have. SEEK MEDICAL CARE IF:   Your condition is not improving after your transfusion.  You develop redness or irritation at the intravenous (IV) site. SEEK IMMEDIATE MEDICAL CARE IF:  Any of the following symptoms occur over the next 12 hours:  Shaking chills.  You have a temperature by mouth above 102 F (38.9 C), not controlled by medicine.  Chest, back, or muscle pain.  People around you feel you are not acting correctly or are confused.  Shortness of breath or difficulty breathing.  Dizziness and fainting.  You get a rash or develop hives.  You have a decrease in urine output.  Your urine turns a dark color or changes to pink, red, or brown. Any of the following symptoms occur over the next 10 days:  You have a temperature by mouth above 102 F (38.9 C), not controlled by medicine.  Shortness of breath.  Weakness after normal  activity.  The white part of the eye turns yellow (jaundice).  You have a decrease in the amount of urine or are urinating less often.  Your urine turns a dark color or changes to pink, red, or brown. Document Released: 05/15/2000 Document Revised: 08/10/2011 Document Reviewed: 01/02/2008 [facility] Patient Information 2014 [place], [place].  _______________________________________________________________________
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
## 2016-03-30 ENCOUNTER — Encounter (HOSPITAL_COMMUNITY): Payer: Self-pay | Admitting: *Deleted

## 2016-03-30 ENCOUNTER — Inpatient Hospital Stay (HOSPITAL_COMMUNITY): Payer: Medicare Other | Admitting: Anesthesiology

## 2016-03-30 ENCOUNTER — Encounter (HOSPITAL_COMMUNITY)
Admission: RE | Disposition: A | Discharge status: Home / Self Care | Payer: Self-pay | Source: Ambulatory Visit | Attending: Orthopedic Surgery

## 2016-03-30 ENCOUNTER — Inpatient Hospital Stay (HOSPITAL_COMMUNITY)
Admission: RE | Admit: 2016-03-30 | Discharge: 2016-03-31 | DRG: 470 | Disposition: A | Discharge status: Home / Self Care | Payer: Medicare Other | Source: Ambulatory Visit | Attending: Orthopedic Surgery | Admitting: Orthopedic Surgery

## 2016-03-30 DIAGNOSIS — Z885 Allergy status to narcotic agent status: Secondary | ICD-10-CM | POA: Diagnosis not present

## 2016-03-30 DIAGNOSIS — Z6837 Body mass index (BMI) 37.0-37.9, adult: Secondary | ICD-10-CM

## 2016-03-30 DIAGNOSIS — Z8551 Personal history of malignant neoplasm of bladder: Secondary | ICD-10-CM | POA: Diagnosis not present

## 2016-03-30 DIAGNOSIS — Z87891 Personal history of nicotine dependence: Secondary | ICD-10-CM

## 2016-03-30 DIAGNOSIS — I10 Essential (primary) hypertension: Secondary | ICD-10-CM | POA: Diagnosis present

## 2016-03-30 DIAGNOSIS — Z9119 Patient's noncompliance with other medical treatment and regimen: Secondary | ICD-10-CM

## 2016-03-30 DIAGNOSIS — Z96651 Presence of right artificial knee joint: Secondary | ICD-10-CM

## 2016-03-30 DIAGNOSIS — G4733 Obstructive sleep apnea (adult) (pediatric): Secondary | ICD-10-CM | POA: Diagnosis present

## 2016-03-30 DIAGNOSIS — Z96652 Presence of left artificial knee joint: Secondary | ICD-10-CM | POA: Diagnosis present

## 2016-03-30 DIAGNOSIS — Z9981 Dependence on supplemental oxygen: Secondary | ICD-10-CM

## 2016-03-30 DIAGNOSIS — G4730 Sleep apnea, unspecified: Secondary | ICD-10-CM | POA: Diagnosis not present

## 2016-03-30 DIAGNOSIS — M797 Fibromyalgia: Secondary | ICD-10-CM | POA: Diagnosis present

## 2016-03-30 DIAGNOSIS — Z96659 Presence of unspecified artificial knee joint: Secondary | ICD-10-CM

## 2016-03-30 DIAGNOSIS — M1990 Unspecified osteoarthritis, unspecified site: Secondary | ICD-10-CM | POA: Diagnosis not present

## 2016-03-30 DIAGNOSIS — Z96612 Presence of left artificial shoulder joint: Secondary | ICD-10-CM | POA: Diagnosis present

## 2016-03-30 DIAGNOSIS — E669 Obesity, unspecified: Secondary | ICD-10-CM | POA: Diagnosis present

## 2016-03-30 DIAGNOSIS — Z96611 Presence of right artificial shoulder joint: Secondary | ICD-10-CM | POA: Diagnosis present

## 2016-03-30 DIAGNOSIS — M1711 Unilateral primary osteoarthritis, right knee: Principal | ICD-10-CM | POA: Diagnosis present

## 2016-03-30 HISTORY — PX: TOTAL KNEE ARTHROPLASTY: SHX125

## 2016-03-30 LAB — TYPE AND SCREEN
ABO/RH(D): O NEG
ANTIBODY SCREEN: NEGATIVE

## 2016-03-30 SURGERY — ARTHROPLASTY, KNEE, TOTAL
Anesthesia: Monitor Anesthesia Care | Site: Knee | Laterality: Right

## 2016-03-30 MED ORDER — DOCUSATE SODIUM 100 MG PO CAPS
100.0000 mg | ORAL_CAPSULE | Freq: Two times a day (BID) | ORAL | Status: DC
  Administered 2016-03-30 – 2016-03-31 (×2): 100 mg via ORAL
  Filled 2016-03-30 (×2): qty 1

## 2016-03-30 MED ORDER — FENTANYL CITRATE (PF) 100 MCG/2ML IJ SOLN
INTRAMUSCULAR | Status: AC
  Filled 2016-03-30: qty 2

## 2016-03-30 MED ORDER — PHENOL 1.4 % MT LIQD
1.0000 | OROMUCOSAL | Status: DC | PRN
  Filled 2016-03-30: qty 177

## 2016-03-30 MED ORDER — CHLORHEXIDINE GLUCONATE 4 % EX LIQD: 60.0000 mL | Freq: Once | CUTANEOUS | Status: DC

## 2016-03-30 MED ORDER — ONDANSETRON HCL 4 MG/2ML IJ SOLN
INTRAMUSCULAR | Status: DC | PRN
  Administered 2016-03-30: 4 mg via INTRAVENOUS

## 2016-03-30 MED ORDER — ALUM & MAG HYDROXIDE-SIMETH 200-200-20 MG/5ML PO SUSP: 30.0000 mL | ORAL | Status: DC | PRN

## 2016-03-30 MED ORDER — BUPIVACAINE-EPINEPHRINE (PF) 0.25% -1:200000 IJ SOLN
INTRAMUSCULAR | Status: DC | PRN
  Administered 2016-03-30: 30 mL

## 2016-03-30 MED ORDER — PROPOFOL 500 MG/50ML IV EMUL
INTRAVENOUS | Status: DC | PRN
  Administered 2016-03-30: 50 ug/kg/min via INTRAVENOUS

## 2016-03-30 MED ORDER — DEXAMETHASONE SODIUM PHOSPHATE 10 MG/ML IJ SOLN
10.0000 mg | Freq: Once | INTRAMUSCULAR | Status: AC
  Administered 2016-03-30: 10 mg via INTRAVENOUS

## 2016-03-30 MED ORDER — MENTHOL 3 MG MT LOZG: 1.0000 | LOZENGE | OROMUCOSAL | Status: DC | PRN

## 2016-03-30 MED ORDER — HYDROMORPHONE HCL 1 MG/ML IJ SOLN: 0.2500 mg | INTRAMUSCULAR | Status: DC | PRN

## 2016-03-30 MED ORDER — BUPIVACAINE HCL (PF) 0.25 % IJ SOLN
INTRAMUSCULAR | Status: AC
  Filled 2016-03-30: qty 30

## 2016-03-30 MED ORDER — KETOROLAC TROMETHAMINE 30 MG/ML IJ SOLN
INTRAMUSCULAR | Status: DC | PRN
  Administered 2016-03-30: 30 mg

## 2016-03-30 MED ORDER — DIPHENHYDRAMINE HCL 25 MG PO CAPS: 25.0000 mg | ORAL_CAPSULE | Freq: Four times a day (QID) | ORAL | Status: DC | PRN

## 2016-03-30 MED ORDER — METHOCARBAMOL 1000 MG/10ML IJ SOLN
500.0000 mg | Freq: Four times a day (QID) | INTRAVENOUS | Status: DC | PRN
  Filled 2016-03-30: qty 5

## 2016-03-30 MED ORDER — METHOCARBAMOL 500 MG PO TABS
500.0000 mg | ORAL_TABLET | Freq: Four times a day (QID) | ORAL | Status: DC | PRN
  Administered 2016-03-30 – 2016-03-31 (×2): 500 mg via ORAL
  Filled 2016-03-30 (×2): qty 1

## 2016-03-30 MED ORDER — DEXAMETHASONE SODIUM PHOSPHATE 10 MG/ML IJ SOLN
INTRAMUSCULAR | Status: AC
  Filled 2016-03-30: qty 1

## 2016-03-30 MED ORDER — STERILE WATER FOR IRRIGATION IR SOLN
Status: DC | PRN
Start: 2016-03-30 — End: 2016-03-30
  Administered 2016-03-30: 2000 mL

## 2016-03-30 MED ORDER — CEFAZOLIN SODIUM-DEXTROSE 2-4 GM/100ML-% IV SOLN
INTRAVENOUS | Status: AC
  Filled 2016-03-30: qty 100

## 2016-03-30 MED ORDER — LACTATED RINGERS IV SOLN
INTRAVENOUS | Status: DC
  Administered 2016-03-30: 1000 mL via INTRAVENOUS
  Administered 2016-03-30: 17:00:00 via INTRAVENOUS

## 2016-03-30 MED ORDER — CELECOXIB 200 MG PO CAPS
200.0000 mg | ORAL_CAPSULE | Freq: Two times a day (BID) | ORAL | Status: DC
  Administered 2016-03-30 – 2016-03-31 (×2): 200 mg via ORAL
  Filled 2016-03-30 (×2): qty 1

## 2016-03-30 MED ORDER — FERROUS SULFATE 325 (65 FE) MG PO TABS
325.0000 mg | ORAL_TABLET | Freq: Three times a day (TID) | ORAL | Status: DC
  Administered 2016-03-31: 325 mg via ORAL
  Filled 2016-03-30: qty 1

## 2016-03-30 MED ORDER — KETOROLAC TROMETHAMINE 30 MG/ML IJ SOLN
INTRAMUSCULAR | Status: AC
  Filled 2016-03-30: qty 1

## 2016-03-30 MED ORDER — BUPIVACAINE IN DEXTROSE 0.75-8.25 % IT SOLN
INTRATHECAL | Status: DC | PRN
  Administered 2016-03-30: 15 mg via INTRATHECAL

## 2016-03-30 MED ORDER — LIDOCAINE 2% (20 MG/ML) 5 ML SYRINGE: INTRAMUSCULAR | Status: DC | PRN

## 2016-03-30 MED ORDER — 0.9 % SODIUM CHLORIDE (POUR BTL) OPTIME
TOPICAL | Status: DC | PRN
  Administered 2016-03-30: 1000 mL

## 2016-03-30 MED ORDER — ONDANSETRON HCL 4 MG/2ML IJ SOLN: 4.0000 mg | Freq: Four times a day (QID) | INTRAMUSCULAR | Status: DC | PRN

## 2016-03-30 MED ORDER — MIDAZOLAM HCL 5 MG/5ML IJ SOLN
INTRAMUSCULAR | Status: DC | PRN
  Administered 2016-03-30: 2 mg via INTRAVENOUS

## 2016-03-30 MED ORDER — HYDROCODONE-ACETAMINOPHEN 7.5-325 MG PO TABS
1.0000 | ORAL_TABLET | ORAL | Status: DC
  Administered 2016-03-30: 1 via ORAL
  Administered 2016-03-31 (×4): 2 via ORAL
  Filled 2016-03-30 (×2): qty 2
  Filled 2016-03-30: qty 1
  Filled 2016-03-30 (×2): qty 2

## 2016-03-30 MED ORDER — PROPOFOL 10 MG/ML IV BOLUS
INTRAVENOUS | Status: AC
  Filled 2016-03-30: qty 40

## 2016-03-30 MED ORDER — PROPOFOL 10 MG/ML IV BOLUS
INTRAVENOUS | Status: AC
  Filled 2016-03-30: qty 20

## 2016-03-30 MED ORDER — SODIUM CHLORIDE 0.9 % IJ SOLN
INTRAMUSCULAR | Status: DC | PRN
  Administered 2016-03-30: 20 mL

## 2016-03-30 MED ORDER — SODIUM CHLORIDE 0.9 % IJ SOLN
INTRAMUSCULAR | Status: AC
  Filled 2016-03-30: qty 50

## 2016-03-30 MED ORDER — ASPIRIN 81 MG PO CHEW
81.0000 mg | CHEWABLE_TABLET | Freq: Two times a day (BID) | ORAL | Status: DC
  Administered 2016-03-31: 81 mg via ORAL
  Filled 2016-03-30: qty 1

## 2016-03-30 MED ORDER — HYDROMORPHONE HCL 1 MG/ML IJ SOLN
0.5000 mg | INTRAMUSCULAR | Status: DC | PRN
  Administered 2016-03-30: 1 mg via INTRAVENOUS
  Administered 2016-03-30: 0.5 mg via INTRAVENOUS
  Administered 2016-03-31: 1 mg via INTRAVENOUS
  Filled 2016-03-30 (×3): qty 1

## 2016-03-30 MED ORDER — POLYETHYLENE GLYCOL 3350 17 G PO PACK
17.0000 g | PACK | Freq: Two times a day (BID) | ORAL | Status: DC
  Administered 2016-03-30 – 2016-03-31 (×2): 17 g via ORAL
  Filled 2016-03-30 (×2): qty 1

## 2016-03-30 MED ORDER — ONDANSETRON HCL 4 MG PO TABS: 4.0000 mg | ORAL_TABLET | Freq: Four times a day (QID) | ORAL | Status: DC | PRN

## 2016-03-30 MED ORDER — BISACODYL 10 MG RE SUPP: 10.0000 mg | Freq: Every day | RECTAL | Status: DC | PRN

## 2016-03-30 MED ORDER — MIDAZOLAM HCL 2 MG/2ML IJ SOLN
INTRAMUSCULAR | Status: AC
  Filled 2016-03-30: qty 2

## 2016-03-30 MED ORDER — FENTANYL CITRATE (PF) 100 MCG/2ML IJ SOLN
INTRAMUSCULAR | Status: DC | PRN
  Administered 2016-03-30 (×2): 50 ug via INTRAVENOUS

## 2016-03-30 MED ORDER — METOCLOPRAMIDE HCL 5 MG/ML IJ SOLN: 5.0000 mg | Freq: Three times a day (TID) | INTRAMUSCULAR | Status: DC | PRN

## 2016-03-30 MED ORDER — SODIUM CHLORIDE 0.9 % IR SOLN
Status: DC | PRN
  Administered 2016-03-30: 1000 mL

## 2016-03-30 MED ORDER — CEFAZOLIN SODIUM-DEXTROSE 2-4 GM/100ML-% IV SOLN
2.0000 g | INTRAVENOUS | Status: AC
  Administered 2016-03-30: 2 g via INTRAVENOUS
  Filled 2016-03-30: qty 100

## 2016-03-30 MED ORDER — POTASSIUM CHLORIDE 2 MEQ/ML IV SOLN
INTRAVENOUS | Status: DC
  Administered 2016-03-30: 22:00:00 via INTRAVENOUS
  Filled 2016-03-30 (×3): qty 1000

## 2016-03-30 MED ORDER — CEFAZOLIN SODIUM-DEXTROSE 2-4 GM/100ML-% IV SOLN
2.0000 g | Freq: Four times a day (QID) | INTRAVENOUS | Status: AC
  Administered 2016-03-30 – 2016-03-31 (×2): 2 g via INTRAVENOUS
  Filled 2016-03-30 (×2): qty 100

## 2016-03-30 MED ORDER — ONDANSETRON HCL 4 MG/2ML IJ SOLN
INTRAMUSCULAR | Status: AC
  Filled 2016-03-30: qty 2

## 2016-03-30 MED ORDER — DEXAMETHASONE SODIUM PHOSPHATE 10 MG/ML IJ SOLN: 10.0000 mg | Freq: Once | INTRAMUSCULAR | Status: DC

## 2016-03-30 MED ORDER — OXYBUTYNIN CHLORIDE 5 MG PO TABS
5.0000 mg | ORAL_TABLET | Freq: Two times a day (BID) | ORAL | Status: DC
Start: 2016-03-30 — End: 2016-03-31
  Administered 2016-03-30 – 2016-03-31 (×2): 5 mg via ORAL
  Filled 2016-03-30 (×2): qty 1

## 2016-03-30 MED ORDER — MAGNESIUM CITRATE PO SOLN: 1.0000 | Freq: Once | ORAL | Status: DC | PRN

## 2016-03-30 MED ORDER — SODIUM CHLORIDE 0.9 % IV SOLN
1000.0000 mg | INTRAVENOUS | Status: AC
  Administered 2016-03-30: 1000 mg via INTRAVENOUS
  Filled 2016-03-30: qty 1100

## 2016-03-30 MED ORDER — METOCLOPRAMIDE HCL 5 MG PO TABS: 5.0000 mg | ORAL_TABLET | Freq: Three times a day (TID) | ORAL | Status: DC | PRN

## 2016-03-30 SURGICAL SUPPLY — 46 items
BAG ZIPLOCK 12X15 (MISCELLANEOUS) ×3 IMPLANT
BANDAGE ACE 6X5 VEL STRL LF (GAUZE/BANDAGES/DRESSINGS) ×3 IMPLANT
BLADE SAW SGTL 13.0X1.19X90.0M (BLADE) ×3 IMPLANT
BOWL SMART MIX CTS (DISPOSABLE) ×3 IMPLANT
CAPT KNEE TOTAL 3 ATTUNE ×3 IMPLANT
CEMENT HV SMART SET (Cement) ×6 IMPLANT
CLOTH BEACON ORANGE TIMEOUT ST (SAFETY) ×3 IMPLANT
CUFF TOURN SGL QUICK 34 (TOURNIQUET CUFF) ×2
CUFF TRNQT CYL 34X4X40X1 (TOURNIQUET CUFF) ×1 IMPLANT
DECANTER SPIKE VIAL GLASS SM (MISCELLANEOUS) ×3 IMPLANT
DERMABOND ADVANCED (GAUZE/BANDAGES/DRESSINGS) ×2
DERMABOND ADVANCED .7 DNX12 (GAUZE/BANDAGES/DRESSINGS) ×1 IMPLANT
DRAPE U-SHAPE 47X51 STRL (DRAPES) ×3 IMPLANT
DRESSING AQUACEL AG SP 3.5X10 (GAUZE/BANDAGES/DRESSINGS) ×1 IMPLANT
DRSG AQUACEL AG SP 3.5X10 (GAUZE/BANDAGES/DRESSINGS) ×3
DURAPREP 26ML APPLICATOR (WOUND CARE) ×6 IMPLANT
ELECT REM PT RETURN 9FT ADLT (ELECTROSURGICAL) ×3
ELECTRODE REM PT RTRN 9FT ADLT (ELECTROSURGICAL) ×1 IMPLANT
GLOVE BIOGEL PI IND STRL 7.0 (GLOVE) ×1 IMPLANT
GLOVE BIOGEL PI IND STRL 7.5 (GLOVE) ×5 IMPLANT
GLOVE BIOGEL PI IND STRL 8 (GLOVE) ×2 IMPLANT
GLOVE BIOGEL PI INDICATOR 7.0 (GLOVE) ×2
GLOVE BIOGEL PI INDICATOR 7.5 (GLOVE) ×10
GLOVE BIOGEL PI INDICATOR 8 (GLOVE) ×4
GLOVE ECLIPSE 8.0 STRL XLNG CF (GLOVE) ×3 IMPLANT
GLOVE ORTHO TXT STRL SZ7.5 (GLOVE) ×6 IMPLANT
GLOVE SURG SS PI 7.0 STRL IVOR (GLOVE) ×3 IMPLANT
GLOVE SURG SS PI 7.5 STRL IVOR (GLOVE) ×3 IMPLANT
GOWN STRL REUS W/ TWL XL LVL3 (GOWN DISPOSABLE) ×1 IMPLANT
GOWN STRL REUS W/TWL LRG LVL3 (GOWN DISPOSABLE) ×3 IMPLANT
GOWN STRL REUS W/TWL XL LVL3 (GOWN DISPOSABLE) ×8 IMPLANT
HANDPIECE INTERPULSE COAX TIP (DISPOSABLE) ×2
MANIFOLD NEPTUNE II (INSTRUMENTS) ×3 IMPLANT
PACK TOTAL KNEE CUSTOM (KITS) ×3 IMPLANT
POSITIONER SURGICAL ARM (MISCELLANEOUS) ×3 IMPLANT
SET HNDPC FAN SPRY TIP SCT (DISPOSABLE) ×1 IMPLANT
SET PAD KNEE POSITIONER (MISCELLANEOUS) ×3 IMPLANT
SUT MNCRL AB 4-0 PS2 18 (SUTURE) ×3 IMPLANT
SUT VIC AB 1 CT1 36 (SUTURE) ×3 IMPLANT
SUT VIC AB 2-0 CT1 27 (SUTURE) ×6
SUT VIC AB 2-0 CT1 TAPERPNT 27 (SUTURE) ×3 IMPLANT
SUT VLOC 180 0 24IN GS25 (SUTURE) ×3 IMPLANT
SYR 50ML LL SCALE MARK (SYRINGE) ×3 IMPLANT
TRAY FOLEY W/METER SILVER 16FR (SET/KITS/TRAYS/PACK) ×3 IMPLANT
WRAP KNEE MAXI GEL POST OP (GAUZE/BANDAGES/DRESSINGS) ×3 IMPLANT
YANKAUER SUCT BULB TIP 10FT TU (MISCELLANEOUS) ×3 IMPLANT

## 2016-03-30 NOTE — Anesthesia Procedure Notes (Addendum)
Spinal  Patient location during procedure: OR Start time: 03/30/2016 4:09 PM End time: 03/30/2016 4:12 PM Staffing Anesthesiologist: Roderic Palau Performed: anesthesiologist  Preanesthetic Checklist Completed: patient identified, surgical consent, pre-op evaluation, timeout performed, IV checked, risks and benefits discussed and monitors and equipment checked Spinal Block Patient position: sitting Prep: DuraPrep Patient monitoring: cardiac monitor, continuous pulse ox and blood pressure Approach: midline Location: L3-4 Injection technique: single-shot Needle Needle type: Pencan  Needle gauge: 24 G Needle length: 9 cm Assessment Sensory level: T8 Additional Notes Functioning IV was confirmed and monitors were applied. Sterile prep and drape, including hand hygiene and sterile gloves were used. The patient was positioned and the spine was prepped. The skin was anesthetized with lidocaine.  Free flow of clear CSF was obtained prior to injecting local anesthetic into the CSF.  The spinal needle aspirated freely following injection.  The needle was carefully withdrawn.  The patient tolerated the procedure well. I took the procedure over for K. Walker, CRNA after unsuccessful attempt.

## 2016-03-30 NOTE — Interval H&P Note (Signed)
History and Physical Interval Note:  03/30/2016 3:10 PM  Billy Rasmussen  has presented today for surgery, with the diagnosis of right knee oa  The various methods of treatment have been discussed with the patient and family. After consideration of risks, benefits and other options for treatment, the patient has consented to  Procedure(s): RIGHT TOTAL KNEE ARTHROPLASTY (Right) as a surgical intervention .  The patient's history has been reviewed, patient examined, no change in status, stable for surgery.  I have reviewed the patient's chart and labs.  Questions were answered to the patient's satisfaction.     Mauri Pole

## 2016-03-30 NOTE — Op Note (Signed)
NAME:  Billy Rasmussen                      MEDICAL RECORD NO.:  [id number]                             FACILITY:  [facility]      PHYSICIAN:  Pietro Cassis. Alvan Dame, M.D.  DATE OF BIRTH:  03-25-1946      DATE OF PROCEDURE:  03/30/2016                                     OPERATIVE REPORT         PREOPERATIVE DIAGNOSIS:  Right knee osteoarthritis.      POSTOPERATIVE DIAGNOSIS:  Right knee osteoarthritis.      FINDINGS:  The patient was noted to have complete loss of cartilage and   bone-on-bone arthritis with associated osteophytes in the medial and patellofemoral compartments of   the knee with a significant synovitis and associated effusion.      PROCEDURE:  Right total knee replacement.      COMPONENTS USED:  DePuy Attune  rotating platform posterior stabilized knee   system, a size 5 femur, 5 tibia, size 7 PS AOX insert, and 38 anatomic patellar   button.      SURGEON:  Pietro Cassis. Alvan Dame, M.D.      ASSISTANT:  Molli Barrows, PA-C.      ANESTHESIA:  Spinal.      SPECIMENS:  None.      COMPLICATION:  None.      DRAINS:  None.  EBL: <100cc      TOURNIQUET TIME:    31 min @250mmHg      The patient was stable to the recovery room.      INDICATION FOR PROCEDURE:  Billy Rasmussen is a 70 y.o. male patient of   mine.  The patient had been seen, evaluated, and treated conservatively in the   office with medication, activity modification, and injections.  The patient had   radiographic changes of bone-on-bone arthritis with endplate sclerosis and osteophytes noted.      The patient failed conservative measures including medication, injections, and activity modification, and at this point was ready for more definitive measures.   Based on the radiographic changes and failed conservative measures, the patient   decided to proceed with total knee replacement.  Risks of infection,   DVT, component failure, need for revision surgery, postop course, and   expectations were all   discussed  and reviewed.  Consent was obtained for benefit of pain   relief.      PROCEDURE IN DETAIL:  The patient was brought to the operative theater.   Once adequate anesthesia, preoperative antibiotics, 2 gm of Ancef, 1 gm of Tranexamic Acid, and 10 mg of Decadron administered, the patient was positioned supine with the right thigh tourniquet placed.  The  right lower extremity was prepped and draped in sterile fashion.  A time-   out was performed identifying the patient, planned procedure, and   extremity.      The right lower extremity was placed in the [facility] leg holder.  The leg was   exsanguinated, tourniquet elevated to 250 mmHg.  A midline incision was   made followed by median parapatellar arthrotomy.  Following initial   exposure, attention was first  directed to the patella.  Precut   measurement was noted to be 25 mm.  I resected down to 14 mm and used a   38 anatomic patellar button to restore patellar height as well as cover the cut   surface.      The lug holes were drilled and a metal shim was placed to protect the   patella from retractors and saw blades.      At this point, attention was now directed to the femur.  The femoral   canal was opened with a drill, irrigated to try to prevent fat emboli.  An   intramedullary rod was passed at 5 degrees valgus, 9 mm of bone was   resected off the distal femur.  Following this resection, the tibia was   subluxated anteriorly.  Using the extramedullary guide, 2 mm of bone was resected off   the proximal medial tibia.  We confirmed the gap would be   stable medially and laterally with a size 5 spacer block as well as confirmed   the cut was perpendicular in the coronal plane, checking with an alignment rod.      Once this was done, I sized the femur to be a size 5 in the anterior-   posterior dimension, chose a standard component based on medial and   lateral dimension.  The size 5 rotation block was then pinned in   position  anterior referenced using the C-clamp to set rotation.  The   anterior, posterior, and  chamfer cuts were made without difficulty nor   notching making certain that I was along the anterior cortex to help   with flexion gap stability.      The final box cut was made off the lateral aspect of distal femur.      At this point, the tibia was sized to be a size 5, the size 5 tray was   then pinned in position through the medial third of the tubercle,   drilled, and keel punched.  Trial reduction was now carried with a 5 femur,  5 tibia, a size 6 then 7 mm insert, and the 38 anatomic patella botton.  The knee was brought to   extension, full extension with good flexion stability with the patella   tracking through the trochlea without application of pressure.  Given   all these findings the femoral lug holes were drilled and then the trial components removed.  Final components were   opened and cement was mixed.  The knee was irrigated with normal saline   solution and pulse lavage.  The synovial lining was   then injected with 30 cc of 0.25% Marcaine with epinephrine and 1 cc of Toradol plus 30 cc of NS for a  total of 61 cc.      The knee was irrigated.  Final implants were then cemented onto clean and   dried cut surfaces of bone with the knee brought to extension with a size 7 PS trial insert.      Once the cement had fully cured, the excess cement was removed   throughout the knee.  I confirmed I was satisfied with the range of   motion and stability, and the final size 7 PS AOX insert was chosen.  It was   placed into the knee.      The tourniquet had been let down at 31 minutes.  No significant   hemostasis required.  The   extensor mechanism  was then reapproximated using #1 Vicryl and # 0 V-lock sutures with the knee   in flexion.  The   remaining wound was closed with 2-0 Vicryl and running 4-0 Monocryl.   The knee was cleaned, dried, dressed sterilely using Dermabond and    Aquacel dressing.  The patient was then   brought to recovery room in stable condition, tolerating the procedure   well.   Please note that Physician Assistant, Molli Barrows, PA-C, was present for the entirety of the case, and was utilized for pre-operative positioning, peri-operative retractor management, general facilitation of the procedure.  He was also utilized for primary wound closure at the end of the case.              Pietro Cassis Alvan Dame, M.D.    03/30/2016 3:43 PM

## 2016-03-30 NOTE — Anesthesia Postprocedure Evaluation (Signed)
Anesthesia Post Note  Patient: Billy Rasmussen  Procedure(s) Performed: Procedure(s) (LRB): RIGHT TOTAL KNEE ARTHROPLASTY (Right)  Patient location during evaluation: PACU Anesthesia Type: Spinal and MAC Level of consciousness: awake and alert Pain management: pain level controlled Vital Signs Assessment: post-procedure vital signs reviewed and stable Respiratory status: spontaneous breathing and respiratory function stable Cardiovascular status: blood pressure returned to baseline and stable Postop Assessment: spinal receding Anesthetic complications: no    Last Vitals:  Vitals:   03/30/16 1845 03/30/16 1900  BP: 124/69 (!) 145/77  Pulse: (!) 54 (!) 55  Resp: 15 14  Temp:      Last Pain:  Vitals:   03/30/16 1845  TempSrc:   PainSc: 0-No pain                 Jarian Longoria,W. EDMOND

## 2016-03-30 NOTE — Transfer of Care (Signed)
Immediate Anesthesia Transfer of Care Note  Patient: Billy Rasmussen  Procedure(s) Performed: Procedure(s): RIGHT TOTAL KNEE ARTHROPLASTY (Right)  Patient Location: PACU  Anesthesia Type:Spinal  Level of Consciousness: awake, alert  and oriented  Airway & Oxygen Therapy: Patient Spontanous Breathing and Patient connected to face mask oxygen  Post-op Assessment: Report given to RN and Post -op Vital signs reviewed and stable  Post vital signs: Reviewed and stable  Last Vitals:  Vitals:   03/30/16 1219  BP: (!) 149/87  Pulse: 78  Resp: 18  Temp: 36.8 C    Last Pain:  Vitals:   03/30/16 1532  TempSrc:   PainSc: 2       Patients Stated Pain Goal: 4 ([id number])  Complications: No apparent anesthesia complications

## 2016-03-30 NOTE — Anesthesia Procedure Notes (Signed)
Procedure Name: MAC Date/Time: 03/30/2016 4:02 PM Performed by: West Pugh Pre-anesthesia Checklist: Patient identified, Timeout performed, Emergency Drugs available, Suction available and Patient being monitored Patient Re-evaluated:Patient Re-evaluated prior to inductionOxygen Delivery Method: Simple face mask Preoxygenation: Pre-oxygenation with 100% oxygen Placement Confirmation: positive ETCO2 Dental Injury: Teeth and Oropharynx as per pre-operative assessment

## 2016-03-30 NOTE — Anesthesia Preprocedure Evaluation (Addendum)
Anesthesia Evaluation  Patient identified by MRN, date of birth, ID band Patient awake    Reviewed: Allergy & Precautions, H&P , NPO status , Patient's Chart, lab work & pertinent test results  History of Anesthesia Complications (+) PONV  Airway Mallampati: III  TM Distance: >3 FB Neck ROM: Full    Dental no notable dental hx. (+) Teeth Intact, Dental Advisory Given   Pulmonary sleep apnea , former smoker,    Pulmonary exam normal breath sounds clear to auscultation       Cardiovascular hypertension, Pt. on medications  Rhythm:Regular Rate:Normal     Neuro/Psych negative neurological ROS  negative psych ROS   GI/Hepatic negative GI ROS, Neg liver ROS,   Endo/Other  negative endocrine ROS  Renal/GU negative Renal ROS  negative genitourinary   Musculoskeletal  (+) Arthritis , Osteoarthritis,  Fibromyalgia -  Abdominal   Peds  Hematology negative hematology ROS (+)   Anesthesia Other Findings   Reproductive/Obstetrics negative OB ROS                            Anesthesia Physical Anesthesia Plan  ASA: III  Anesthesia Plan: MAC and Spinal   Post-op Pain Management:    Induction: Intravenous  Airway Management Planned: Simple Face Mask  Additional Equipment:   Intra-op Plan:   Post-operative Plan:   Informed Consent: I have reviewed the patients History and Physical, chart, labs and discussed the procedure including the risks, benefits and alternatives for the proposed anesthesia with the patient or authorized representative who has indicated his/her understanding and acceptance.   Dental advisory given  Plan Discussed with: CRNA  Anesthesia Plan Comments:         Anesthesia Quick Evaluation

## 2016-03-31 ENCOUNTER — Encounter (HOSPITAL_COMMUNITY): Payer: Self-pay | Admitting: Orthopedic Surgery

## 2016-03-31 DIAGNOSIS — E669 Obesity, unspecified: Secondary | ICD-10-CM | POA: Diagnosis present

## 2016-03-31 LAB — BASIC METABOLIC PANEL
ANION GAP: 7 (ref 5–15)
BUN: 16 mg/dL (ref 6–20)
CHLORIDE: 102 mmol/L (ref 101–111)
CO2: 26 mmol/L (ref 22–32)
Calcium: 9.2 mg/dL (ref 8.9–10.3)
Creatinine, Ser: 0.78 mg/dL (ref 0.61–1.24)
GFR calc Af Amer: >60 mL/min (ref >60)
GLUCOSE: 150 mg/dL — AB (ref 65–99)
POTASSIUM: 4.2 mmol/L (ref 3.5–5.1)
Sodium: 135 mmol/L (ref 135–145)

## 2016-03-31 LAB — CBC
HEMATOCRIT: 37.5 % — AB (ref 39.0–52.0)
HEMOGLOBIN: 13.1 g/dL (ref 13.0–17.0)
MCH: 30.8 pg (ref 26.0–34.0)
MCHC: 34.9 g/dL (ref 30.0–36.0)
MCV: 88.2 fL (ref 78.0–100.0)
Platelets: 158 10*3/uL (ref 150–400)
RBC: 4.25 MIL/uL (ref 4.22–5.81)
RDW: 12.9 % (ref 11.5–15.5)
WBC: 10.9 10*3/uL — AB (ref 4.0–10.5)

## 2016-03-31 MED ORDER — METHOCARBAMOL 500 MG PO TABS: 500.0000 mg | ORAL_TABLET | Freq: Four times a day (QID) | ORAL | 0 refills | Status: DC | PRN

## 2016-03-31 MED ORDER — ASPIRIN 81 MG PO CHEW: 81.0000 mg | CHEWABLE_TABLET | Freq: Two times a day (BID) | ORAL | 0 refills | Status: AC

## 2016-03-31 MED ORDER — FERROUS SULFATE 325 (65 FE) MG PO TABS: 325.0000 mg | ORAL_TABLET | Freq: Three times a day (TID) | ORAL | 3 refills | Status: DC

## 2016-03-31 MED ORDER — POLYETHYLENE GLYCOL 3350 17 G PO PACK: 17.0000 g | PACK | Freq: Two times a day (BID) | ORAL | 0 refills | Status: DC

## 2016-03-31 MED ORDER — HYDROCODONE-ACETAMINOPHEN 7.5-325 MG PO TABS: 1.0000 | ORAL_TABLET | ORAL | 0 refills | Status: DC | PRN

## 2016-03-31 MED ORDER — DOCUSATE SODIUM 100 MG PO CAPS: 100.0000 mg | ORAL_CAPSULE | Freq: Two times a day (BID) | ORAL | 0 refills | Status: DC

## 2016-03-31 NOTE — Evaluation (Signed)
Physical Therapy Evaluation Patient Details Name: KAYVON MO MRN: [id number] DOB: 11-22-1945 Today's Date: 03/31/2016   History of Present Illness  RTKA  Clinical Impression  The patient is mobilizing well. Ready for DC. OPPT Friday.     Follow Up Recommendations Outpatient PT    Equipment Recommendations  None recommended by PT    Recommendations for Other Services       Precautions / Restrictions Precautions Precautions: Knee      Mobility  Bed Mobility Overal bed mobility: Modified Independent                Transfers Overall transfer level: Modified independent                  Ambulation/Gait Ambulation/Gait assistance: Supervision Ambulation Distance (Feet): 200 Feet Assistive device: Rolling walker (2 wheeled) Gait Pattern/deviations: Step-to pattern;Step-through pattern     General Gait Details: cues for sequence  Stairs            Wheelchair Mobility    Modified Rankin (Stroke Patients Only)       Balance                                             Pertinent Vitals/Pain Pain Assessment: 0-10 Pain Score: 3  Pain Location: right knee Pain Descriptors / Indicators: Discomfort Pain Intervention(s): Monitored during session;Premedicated before session;Ice applied    Home Living Family/patient expects to be discharged to:: Private residence Living Arrangements: Spouse/significant other Available Help at Discharge: Family;Available 24 hours/day Type of Home: House Home Access: Stairs to enter   [employer] of Steps: 1 Home Layout: One level Home Equipment: Walker - 2 wheels      Prior Function                 Hand Dominance        Extremity/Trunk Assessment   Upper Extremity Assessment: Overall WFL for tasks assessed           Lower Extremity Assessment: RLE deficits/detail RLE Deficits / Details: knee flexion t10-80, + SLR       Communication      Cognition  Arousal/Alertness: Awake/alert Behavior During Therapy: WFL for tasks assessed/performed Overall Cognitive Status: Within Functional Limits for tasks assessed                      General Comments      Exercises Total Joint Exercises Ankle Circles/Pumps: AROM;Both;10 reps Quad Sets: AROM;Both;10 reps Towel Squeeze: AROM;Right;10 reps Short Arc Quad: AROM;Right;10 reps Heel Slides: AROM Hip ABduction/ADduction: AROM;Right;10 reps Straight Leg Raises: AROM;Right;10 reps   Assessment/Plan    PT Assessment Patent does not need any further PT services;All further PT needs can be met in the next venue of care  PT Problem List Decreased strength;Decreased range of motion;Decreased activity tolerance;Decreased mobility;Pain          PT Treatment Interventions      PT Goals (Current goals can be found in the Care Plan section)  Acute Rehab PT Goals Patient Stated Goal: to go home PT Goal Formulation: All assessment and education complete, DC therapy    Frequency     Barriers to discharge        Co-evaluation               End of Session   Activity Tolerance: Patient  tolerated treatment well Patient left: in bed;with call bell/phone within reach;with family/visitor present Nurse Communication: Mobility status         Time: 1027-1109 PT Time Calculation (min) (ACUTE ONLY): 42 min   Charges:   PT Evaluation $PT Eval Low Complexity: 1 Procedure PT Treatments $Gait Training: 8-22 mins $Therapeutic Exercise: 8-22 mins   PT G Codes:        Claretha Cooper 03/31/2016, 1:48 PM

## 2016-03-31 NOTE — Care Management Note (Signed)
Case Management Note  Patient Details  Name: THADDAEUS GRANJA MRN: [id number] Date of Birth: 05-Jul-1945  Subjective/Objective:                  RIGHT TOTAL KNEE ARTHROPLASTY (Right)  Action/Plan:  Expected Discharge Date:                  Expected Discharge Plan:  Home/Self Care  In-House Referral:     Discharge planning Services  CM Consult  Post Acute Care Choice:  NA Choice offered to:  Patient  DME Arranged:  N/A DME Agency:  NA  HH Arranged:  NA HH Agency:  NA  Status of Service:  Completed, signed off  If discussed at [place] of Stay Meetings, dates discussed:    Additional Comments: Cm met with pt in room to confirm plan is for pt to go to outpt PT; pt confirms as does CM  Sharee Pimple Lauer's note.  Pt states he has all DME at home.  No other CM needs were communicated. Dellie Catholic, RN 03/31/2016, 10:26 AM

## 2016-03-31 NOTE — Progress Notes (Signed)
     Subjective: 1 Day Post-Op Procedure(s) (LRB): RIGHT TOTAL KNEE ARTHROPLASTY (Right)   Patient reports pain as mild, pain controlled. No events throughout the night. Ready to work to be discharged home if he does well with PT.   Objective:   VITALS:   Vitals:   03/31/16 0247 03/31/16 0634  BP: (!) 146/79 (!) 155/86  Pulse: 76 79  Resp: 16 18  Temp: 98.6 F (37 C) 97.8 F (36.6 C)    Dorsiflexion/Plantar flexion intact Incision: dressing C/D/I No cellulitis present Compartment soft  LABS  Recent Labs  03/31/16 0401  HGB 13.1  HCT 37.5*  WBC 10.9*  PLT 158     Recent Labs  03/31/16 0401  NA 135  K 4.2  BUN 16  CREATININE 0.78  GLUCOSE 150*     Assessment/Plan: 1 Day Post-Op Procedure(s) (LRB): RIGHT TOTAL KNEE ARTHROPLASTY (Right) Foley cath d/c'ed Advance diet Up with therapy D/C IV fluids Discharge home with home health  Follow up in 2 weeks at [facility]. Follow up with OLIN,Honore Wipperfurth D in 2 weeks.  Contact information:  [facility] [address] [place] [id number]    Obese (BMI 30-39.9) Estimated body mass index is 37.43 kg/m as calculated from the following:   Height as of this encounter: 5\' 7"  (1.702 m).   Weight as of this encounter: 108.4 kg (239 lb). Patient also counseled that weight may inhibit the healing process Patient counseled that losing weight will help with future health issues          West Pugh. Stephan Draughn   PAC  03/31/2016, 8:01 AM

## 2016-03-31 NOTE — Discharge Instructions (Signed)

## 2016-03-31 NOTE — Progress Notes (Signed)
OT Cancellation Note  Patient Details Name: MONTOYA OVALLE MRN: [id number] DOB: 06-17-1945   Cancelled Treatment:    Reason Eval/Treat Not Completed: OT screened, no needs identified, will sign off  [place] 03/31/2016, 12:37 PM  Lesle Chris, OTR/L [id number] 03/31/2016

## 2016-04-03 ENCOUNTER — Encounter: Payer: Self-pay | Admitting: Physical Therapy

## 2016-04-03 ENCOUNTER — Ambulatory Visit: Payer: Medicare Other | Attending: Orthopedic Surgery | Admitting: Physical Therapy

## 2016-04-03 DIAGNOSIS — M25561 Pain in right knee: Secondary | ICD-10-CM | POA: Diagnosis not present

## 2016-04-03 DIAGNOSIS — M25661 Stiffness of right knee, not elsewhere classified: Secondary | ICD-10-CM | POA: Diagnosis not present

## 2016-04-03 DIAGNOSIS — M6281 Muscle weakness (generalized): Secondary | ICD-10-CM | POA: Diagnosis not present

## 2016-04-03 DIAGNOSIS — R600 Localized edema: Secondary | ICD-10-CM | POA: Insufficient documentation
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
## 2016-04-08 ENCOUNTER — Encounter: Payer: Self-pay | Admitting: Physical Therapy

## 2016-04-08 ENCOUNTER — Ambulatory Visit: Payer: Medicare Other | Admitting: Physical Therapy

## 2016-04-08 DIAGNOSIS — M6281 Muscle weakness (generalized): Secondary | ICD-10-CM

## 2016-04-08 DIAGNOSIS — M25561 Pain in right knee: Secondary | ICD-10-CM

## 2016-04-08 DIAGNOSIS — R600 Localized edema: Secondary | ICD-10-CM

## 2016-04-08 DIAGNOSIS — M25661 Stiffness of right knee, not elsewhere classified: Secondary | ICD-10-CM | POA: Diagnosis not present

## 2016-04-08 DIAGNOSIS — C674 Malignant neoplasm of posterior wall of bladder: Secondary | ICD-10-CM | POA: Diagnosis not present

## 2016-04-08 NOTE — Patient Instructions (Signed)
Chair Knee Flexion   Keeping feet on floor, slide foot of operated leg back, bending knee. Hold ___30_ seconds. Repeat __5__ times. Do __2-4__ sessions a day.  Heel Slide   Bend left knee and pull heel toward buttocks. Use strap around foot and pull strap with arms to assist knee to bend further. Hold 10 secs.  Repeat __10__ times. Do _4___ sessions per day.   Knee Extension Mobilization: Towel Prop   With rolled towel under right ankle, place _1-5___ pound weight across knee. Hold __5+__ minutes. Repeat __2-3__ times per set. Do __2__ sets per session. Do __2-4__ sessions per day.   KNEE: Knee Hang - Prone   Sit and Reach - Foot Supported   Place one foot on table. Straighten leg and attempt to keep it straight, then push down until feel a stretch. Hold _30__ seconds. Repeat __5-10_ times each leg, alternating. Do _2-4__ sessions per day.   Copyright  VHI. All rights reserved.
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
## 2016-04-08 NOTE — Discharge Summary (Signed)
Physician Discharge Summary  Patient ID: LENNIE GRAEFF MRN: [id number] DOB/AGE: 68-Nov-1947 68 y.o.  Admit date: 03/30/2016 Discharge date: 03/31/2016   Procedures:  Procedure(s) (LRB): RIGHT TOTAL KNEE ARTHROPLASTY (Right)  Attending Physician:  Dr. Paralee Cancel   Admission Diagnoses:   Right knee primary OA / pain  Discharge Diagnoses:  Principal Problem:   S/P right TKA Active Problems:   S/P knee replacement   Obese  Past Medical History:  Diagnosis Date  . Arthritis    knees, shoulders, elbows  . Bladder cancer [facility])    urologist-  dr Junious Silk  . Diverticulosis of colon   . Fibromyalgia   . History of blood transfusion   . History of bronchitis   . History of diverticulitis of colon   . History of gastric ulcer   . Hypertension   . Incomplete left bundle branch block   . Lower urinary tract symptoms (LUTS)   . OSA (obstructive sleep apnea)    NON-COMPLIANT  CPAP  --- BUT PT USES OXYGEN AT NIGHT 2.5L VIA [place] (PT'S DECISION)  . PONV (postoperative nausea and vomiting)   . Tinnitus   . Urinary frequency   . Wears glasses   . Wears partial dentures     HPI:    Billy Rasmussen, 70 y.o. male, has a history of pain and functional disability in the right knee due to arthritis and has failed non-surgical conservative treatments for greater than 12 weeks to include NSAID's and/or analgesics and activity modification.  Onset of symptoms was gradual, starting 2+ years ago with gradually worsening course since that time. The patient noted no past surgery on the right knee(s).  Patient currently rates pain in the right knee(s) at 10 out of 10 with activity. Patient has night pain, worsening of pain with activity and weight bearing, pain that interferes with activities of daily living, pain with passive range of motion, crepitus and joint swelling.  Patient has evidence of periarticular osteophytes and joint space narrowing by imaging studies.  There is no active infection.    Risks, benefits and expectations were discussed with the patient.  Risks including but not limited to the risk of anesthesia, blood clots, nerve damage, blood vessel damage, failure of the prosthesis, infection and up to and including death.  Patient understand the risks, benefits and expectations and wishes to proceed with surgery.   PCP: Terald Sleeper, PA-C   Discharged Condition: good  Hospital Course:  Patient underwent the above stated procedure on 03/30/2016. Patient tolerated the procedure well and brought to the recovery room in good condition and subsequently to the floor.  POD #1 BP: 155/86 ; Pulse: 79 ; Temp: 97.8 F (36.6 C) ; Resp: 18 Patient reports pain as mild, pain controlled. No events throughout the night. Ready to work to be discharged home. Dorsiflexion/plantar flexion intact, incision: dressing C/D/I, no cellulitis present and compartment soft.   LABS  Basename    HGB     13.1  HCT     37.5    Discharge Exam: General appearance: alert, cooperative and no distress Extremities: Homans sign is negative, no sign of DVT, no edema, redness or tenderness in the calves or thighs and no ulcers, gangrene or trophic changes  Disposition: Home with follow up in 2 weeks   Follow-up Information    Mauri Pole, MD. Schedule an appointment as soon as possible for a visit in 2 week(s).   Specialty:  Orthopedic Surgery Contact information: [address]  [place] [address] [id number]           Discharge Instructions    Call MD / Call 911    Complete by:  As directed    If you experience chest pain or shortness of breath, CALL 911 and be transported to the hospital emergency room.  If you develope a fever above 101 F, pus (white drainage) or increased drainage or redness at the wound, or calf pain, call your surgeon's office.   Change dressing    Complete by:  As directed    Maintain surgical dressing until follow up in the clinic. If the edges  start to pull up, may reinforce with tape. If the dressing is no longer working, may remove and cover with gauze and tape, but must keep the area dry and clean.  Call with any questions or concerns.   Constipation Prevention    Complete by:  As directed    Drink plenty of fluids.  Prune juice may be helpful.  You may use a stool softener, such as Colace (over the counter) 100 mg twice a day.  Use MiraLax (over the counter) for constipation as needed.   Diet - low sodium heart healthy    Complete by:  As directed    Discharge instructions    Complete by:  As directed    Maintain surgical dressing until follow up in the clinic. If the edges start to pull up, may reinforce with tape. If the dressing is no longer working, may remove and cover with gauze and tape, but must keep the area dry and clean.  Follow up in 2 weeks at [facility]. Call with any questions or concerns.   Increase activity slowly as tolerated    Complete by:  As directed    Weight bearing as tolerated with assist device (walker, cane, etc) as directed, use it as long as suggested by your surgeon or therapist, typically at least 4-6 weeks.   TED hose    Complete by:  As directed    Use stockings (TED hose) for 2 weeks on both leg(s).  You may remove them at night for sleeping.        Medication List    STOP taking these medications   acetaminophen 500 MG tablet Commonly known as:  TYLENOL   diclofenac 75 MG EC tablet Commonly known as:  VOLTAREN     TAKE these medications   aspirin 81 MG chewable tablet Chew 1 tablet (81 mg total) by mouth 2 (two) times daily.   docusate sodium 100 MG capsule Commonly known as:  COLACE Take 1 capsule (100 mg total) by mouth 2 (two) times daily.   ferrous sulfate 325 (65 FE) MG tablet Take 1 tablet (325 mg total) by mouth 3 (three) times daily after meals.   HYDROcodone-acetaminophen 7.5-325 MG tablet Commonly known as:  NORCO Take 1-2 tablets by mouth every 4  (four) hours as needed for moderate pain.   lisinopril-hydrochlorothiazide 20-12.5 MG tablet Commonly known as:  PRINZIDE,ZESTORETIC Take 2 tablets by mouth every morning.   methocarbamol 500 MG tablet Commonly known as:  ROBAXIN Take 1 tablet (500 mg total) by mouth every 6 (six) hours as needed for muscle spasms.   oxybutynin 5 MG tablet Commonly known as:  DITROPAN Take 5 mg by mouth 2 (two) times daily.   OXYGEN Place 2.5 L/min into the nose at bedtime.   polyethylene glycol packet Commonly known as:  MIRALAX / GLYCOLAX  Take 17 g by mouth 2 (two) times daily.   tetrahydrozoline-zinc 0.05-0.25 % ophthalmic solution Commonly known as:  VISINE-AC Place 2 drops into both eyes 3 (three) times daily as needed (itchy eyes).   triamcinolone cream 0.1 % Commonly known as:  KENALOG Apply 1 application topically 2 (two) times daily.        Signed: [place] Pugh. Emanii Bugbee   PA-C  04/08/2016, 9:32 AM

## 2016-04-09 ENCOUNTER — Ambulatory Visit: Payer: Medicare Other | Admitting: *Deleted

## 2016-04-09 DIAGNOSIS — R600 Localized edema: Secondary | ICD-10-CM | POA: Diagnosis not present

## 2016-04-09 DIAGNOSIS — M6281 Muscle weakness (generalized): Secondary | ICD-10-CM | POA: Diagnosis not present

## 2016-04-09 DIAGNOSIS — M25661 Stiffness of right knee, not elsewhere classified: Secondary | ICD-10-CM | POA: Diagnosis not present

## 2016-04-09 DIAGNOSIS — M25561 Pain in right knee: Secondary | ICD-10-CM | POA: Diagnosis not present
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
## 2016-04-10 ENCOUNTER — Encounter: Payer: Medicare Other | Admitting: *Deleted

## 2016-04-15 ENCOUNTER — Ambulatory Visit: Payer: Medicare Other | Admitting: Physical Therapy

## 2016-04-15 ENCOUNTER — Encounter: Payer: Self-pay | Admitting: Physical Therapy

## 2016-04-15 DIAGNOSIS — M6281 Muscle weakness (generalized): Secondary | ICD-10-CM

## 2016-04-15 DIAGNOSIS — Z96651 Presence of right artificial knee joint: Secondary | ICD-10-CM | POA: Diagnosis not present

## 2016-04-15 DIAGNOSIS — R600 Localized edema: Secondary | ICD-10-CM | POA: Diagnosis not present

## 2016-04-15 DIAGNOSIS — M25561 Pain in right knee: Secondary | ICD-10-CM

## 2016-04-15 DIAGNOSIS — M25661 Stiffness of right knee, not elsewhere classified: Secondary | ICD-10-CM

## 2016-04-15 DIAGNOSIS — Z471 Aftercare following joint replacement surgery: Secondary | ICD-10-CM | POA: Diagnosis not present
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
## 2016-04-15 NOTE — Patient Instructions (Signed)
Knee Extension Mobilization: Towel Prop   KNEE: Knee Hang - Prone   Lie on stomach. Place towel above knee; hang feet off surface. Keep feet straight. Hold _60__ seconds. _5__ reps per set, __2-4_ sets per day Add _0+__ lb weights to ankles.

## 2016-04-17 ENCOUNTER — Ambulatory Visit: Payer: Medicare Other | Admitting: *Deleted

## 2016-04-17 DIAGNOSIS — M25661 Stiffness of right knee, not elsewhere classified: Secondary | ICD-10-CM

## 2016-04-17 DIAGNOSIS — M6281 Muscle weakness (generalized): Secondary | ICD-10-CM | POA: Diagnosis not present

## 2016-04-17 DIAGNOSIS — M25561 Pain in right knee: Secondary | ICD-10-CM

## 2016-04-17 DIAGNOSIS — R600 Localized edema: Secondary | ICD-10-CM | POA: Diagnosis not present
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
## 2016-04-21 ENCOUNTER — Ambulatory Visit: Payer: Medicare Other | Admitting: Physical Therapy

## 2016-04-21 DIAGNOSIS — M25561 Pain in right knee: Secondary | ICD-10-CM | POA: Diagnosis not present

## 2016-04-21 DIAGNOSIS — R600 Localized edema: Secondary | ICD-10-CM

## 2016-04-21 DIAGNOSIS — M25661 Stiffness of right knee, not elsewhere classified: Secondary | ICD-10-CM | POA: Diagnosis not present

## 2016-04-21 DIAGNOSIS — M6281 Muscle weakness (generalized): Secondary | ICD-10-CM
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
## 2016-04-22 ENCOUNTER — Ambulatory Visit: Payer: Medicare Other | Admitting: Physical Therapy

## 2016-04-22 DIAGNOSIS — M25561 Pain in right knee: Secondary | ICD-10-CM

## 2016-04-22 DIAGNOSIS — M6281 Muscle weakness (generalized): Secondary | ICD-10-CM | POA: Diagnosis not present

## 2016-04-22 DIAGNOSIS — R600 Localized edema: Secondary | ICD-10-CM | POA: Diagnosis not present

## 2016-04-22 DIAGNOSIS — M25661 Stiffness of right knee, not elsewhere classified: Secondary | ICD-10-CM | POA: Diagnosis not present
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
## 2016-04-28 ENCOUNTER — Ambulatory Visit: Payer: Medicare Other | Admitting: *Deleted

## 2016-04-28 DIAGNOSIS — R600 Localized edema: Secondary | ICD-10-CM | POA: Diagnosis not present

## 2016-04-28 DIAGNOSIS — M25561 Pain in right knee: Secondary | ICD-10-CM

## 2016-04-28 DIAGNOSIS — M6281 Muscle weakness (generalized): Secondary | ICD-10-CM | POA: Diagnosis not present

## 2016-04-28 DIAGNOSIS — M25661 Stiffness of right knee, not elsewhere classified: Secondary | ICD-10-CM | POA: Diagnosis not present
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
## 2016-04-30 ENCOUNTER — Ambulatory Visit: Payer: Medicare Other | Admitting: *Deleted

## 2016-04-30 DIAGNOSIS — M25661 Stiffness of right knee, not elsewhere classified: Secondary | ICD-10-CM | POA: Diagnosis not present

## 2016-04-30 DIAGNOSIS — M6281 Muscle weakness (generalized): Secondary | ICD-10-CM | POA: Diagnosis not present

## 2016-04-30 DIAGNOSIS — R600 Localized edema: Secondary | ICD-10-CM

## 2016-04-30 DIAGNOSIS — M25561 Pain in right knee: Secondary | ICD-10-CM | POA: Diagnosis not present
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
## 2016-05-05 ENCOUNTER — Encounter: Payer: Self-pay | Admitting: Family Medicine

## 2016-05-05 ENCOUNTER — Ambulatory Visit (INDEPENDENT_AMBULATORY_CARE_PROVIDER_SITE_OTHER): Payer: Medicare Other | Admitting: Family Medicine

## 2016-05-05 VITALS — BP 100/71 | HR 90 | Temp 97.6°F | Resp 20 | Ht 67.0 in | Wt 217.2 lb

## 2016-05-05 DIAGNOSIS — R399 Unspecified symptoms and signs involving the genitourinary system: Secondary | ICD-10-CM

## 2016-05-05 DIAGNOSIS — N410 Acute prostatitis: Secondary | ICD-10-CM | POA: Diagnosis not present

## 2016-05-05 LAB — URINALYSIS, COMPLETE
BILIRUBIN UA: NEGATIVE
GLUCOSE, UA: NEGATIVE
KETONES UA: NEGATIVE
NITRITE UA: NEGATIVE
SPEC GRAV UA: 1.015 (ref 1.005–1.030)
UUROB: 4 mg/dL — AB (ref 0.2–1.0)
pH, UA: 7 (ref 5.0–7.5)

## 2016-05-05 LAB — MICROSCOPIC EXAMINATION
Epithelial Cells (non renal): >10 /hpf — AB (ref 0–10)
WBC, UA: >30 /hpf — AB (ref 0–?)

## 2016-05-05 MED ORDER — CIPROFLOXACIN HCL 500 MG PO TABS: 500.0000 mg | ORAL_TABLET | Freq: Two times a day (BID) | ORAL | 0 refills | Status: DC

## 2016-05-05 NOTE — Progress Notes (Signed)
Subjective:  Patient ID: Billy Rasmussen, male    DOB: [birth year]  Age: 70 y.o. MRN: [id number]  CC: Painful urination and Urinary Retention   HPI Billy Rasmussen presents for burning with urination and frequency for several days. Denies fever . No flank pain. No nausea, vomiting.   istory Billy Rasmussen has a past medical history of Arthritis; Bladder cancer ([place]); Diverticulosis of colon; Fibromyalgia; History of blood transfusion; History of bronchitis; History of diverticulitis of colon; History of gastric ulcer; Hypertension; Incomplete left bundle branch block; Lower urinary tract symptoms (LUTS); OSA (obstructive sleep apnea); PONV (postoperative nausea and vomiting); Tinnitus; Urinary frequency; Wears glasses; and Wears partial dentures.   He has a past surgical history that includes Total shoulder arthroplasty (Right, 04/06/2013); Total shoulder arthroplasty (Left, 07/20/2013); Total knee arthroplasty (Left, 2006); Knee arthroscopy (Left, X3  LAST ONE  2002); EXCISION RIGHT UPPER ARM LIPOMA (2005); COLECTOMY W/ COLOSTOMY (1996); Colostomy takedown (1996); ORIF LEFT HUMEROUS FX (1976); Tonsillectomy (AS CHILD); Inguinal hernia repair (Left, 1984); Cystoscopy with biopsy (N/A, 12/05/2013); Transurethral resection of bladder tumor with gyrus (turbt-gyrus) (N/A, 12/05/2013); Cystoscopy with biopsy (Bilateral, 11/13/2014); Transurethral resection of bladder tumor (N/A, 11/12/2015); left lower arm surgery; and Total knee arthroplasty (Right, 03/30/2016).   His family history is not on file.He reports that he quit smoking about 18 years ago. His smoking use included Cigarettes. He has a 80.00 pack-year smoking history. He has never used smokeless tobacco. He reports that he does not drink alcohol or use drugs.  Current Outpatient Prescriptions on File Prior to Visit  Medication Sig Dispense Refill  . docusate sodium (COLACE) 100 MG capsule Take 1 capsule (100 mg total) by mouth 2 (two) times daily. 10  capsule 0  . ferrous sulfate 325 (65 FE) MG tablet Take 1 tablet (325 mg total) by mouth 3 (three) times daily after meals.  3  . HYDROcodone-acetaminophen (NORCO) 7.5-325 MG tablet Take 1-2 tablets by mouth every 4 (four) hours as needed for moderate pain. 60 tablet 0  . lisinopril-hydrochlorothiazide (PRINZIDE,ZESTORETIC) 20-12.5 MG per tablet Take 2 tablets by mouth every morning.     . methocarbamol (ROBAXIN) 500 MG tablet Take 1 tablet (500 mg total) by mouth every 6 (six) hours as needed for muscle spasms. 40 tablet 0  . oxybutynin (DITROPAN) 5 MG tablet Take 5 mg by mouth 2 (two) times daily.    . OXYGEN Place 2.5 L/min into the nose at bedtime.    . polyethylene glycol (MIRALAX / GLYCOLAX) packet Take 17 g by mouth 2 (two) times daily. 14 each 0  . tetrahydrozoline-zinc (VISINE-AC) 0.05-0.25 % ophthalmic solution Place 2 drops into both eyes 3 (three) times daily as needed (itchy eyes).    . triamcinolone cream (KENALOG) 0.1 % Apply 1 application topically 2 (two) times daily. 30 g 0   No current facility-administered medications on file prior to visit.     ROS Review of Systems  Constitutional: Negative for chills, fatigue and fever.  HENT: Negative for congestion and sore throat.   Respiratory: Negative for cough, shortness of breath and wheezing.   Cardiovascular: Negative for chest pain.  Genitourinary: Positive for dysuria. Negative for flank pain.  Musculoskeletal: Negative for myalgias.  Skin: Negative for color change and rash.  Neurological: Negative for dizziness and syncope.    Objective:  BP 100/71   Pulse 90   Temp 97.6 F (36.4 C) (Oral)   Resp 20   Ht 5\' 7"  (1.702 m)   Wt 217  lb 3.2 oz (98.5 kg)   SpO2 100%   BMI 34.02 kg/m   Physical Exam  Constitutional: He is oriented to person, place, and time. He appears well-developed and well-nourished.  HENT:  Head: Normocephalic and atraumatic.  Right Ear: External ear normal.  Left Ear: External ear normal.    Mouth/Throat: No oropharyngeal exudate or posterior oropharyngeal erythema.  Eyes: Pupils are equal, round, and reactive to light.  Neck: Normal range of motion. Neck supple.  Cardiovascular: Normal rate and regular rhythm.   No murmur heard. Pulmonary/Chest: Breath sounds normal. No respiratory distress.  Abdominal: Soft. There is no tenderness. There is no rebound and no guarding.  Neurological: He is alert and oriented to person, place, and time.  Vitals reviewed.   Assessment & Plan:   Billy Rasmussen was seen today for painful urination and urinary retention.  Diagnoses and all orders for this visit:  UTI symptoms -     Urinalysis, Complete  Acute prostatitis  Other orders -     ciprofloxacin (CIPRO) 500 MG tablet; Take 1 tablet (500 mg total) by mouth 2 (two) times daily. For prostate. Take all of these.   I am having Mr. Tortora start on ciprofloxacin. I am also having him maintain his lisinopril-hydrochlorothiazide, OXYGEN, triamcinolone cream, tetrahydrozoline-zinc, oxybutynin, docusate sodium, ferrous sulfate, HYDROcodone-acetaminophen, methocarbamol, and polyethylene glycol.  Meds ordered this encounter  Medications  . ciprofloxacin (CIPRO) 500 MG tablet    Sig: Take 1 tablet (500 mg total) by mouth 2 (two) times daily. For prostate. Take all of these.    Dispense:  30 tablet    Refill:  0     Follow-up: Return if symptoms worsen or fail to improve.  Claretta Fraise, M.D.

## 2016-05-06 ENCOUNTER — Encounter: Payer: Self-pay | Admitting: Physical Therapy

## 2016-05-06 ENCOUNTER — Ambulatory Visit: Payer: Medicare Other | Attending: Orthopedic Surgery | Admitting: Physical Therapy

## 2016-05-06 DIAGNOSIS — M25561 Pain in right knee: Secondary | ICD-10-CM | POA: Diagnosis not present

## 2016-05-06 DIAGNOSIS — R600 Localized edema: Secondary | ICD-10-CM | POA: Insufficient documentation

## 2016-05-06 DIAGNOSIS — M25661 Stiffness of right knee, not elsewhere classified: Secondary | ICD-10-CM | POA: Insufficient documentation

## 2016-05-06 DIAGNOSIS — M6281 Muscle weakness (generalized): Secondary | ICD-10-CM | POA: Diagnosis not present
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
## 2016-05-08 ENCOUNTER — Encounter: Payer: Self-pay | Admitting: Physical Therapy

## 2016-05-08 ENCOUNTER — Ambulatory Visit: Payer: Medicare Other | Admitting: Physical Therapy

## 2016-05-08 DIAGNOSIS — M6281 Muscle weakness (generalized): Secondary | ICD-10-CM

## 2016-05-08 DIAGNOSIS — M25561 Pain in right knee: Secondary | ICD-10-CM | POA: Diagnosis not present

## 2016-05-08 DIAGNOSIS — R600 Localized edema: Secondary | ICD-10-CM | POA: Diagnosis not present

## 2016-05-08 DIAGNOSIS — M25661 Stiffness of right knee, not elsewhere classified: Secondary | ICD-10-CM
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
## 2016-05-11 ENCOUNTER — Ambulatory Visit: Payer: Medicare Other | Admitting: Physical Therapy

## 2016-05-11 ENCOUNTER — Encounter: Payer: Self-pay | Admitting: Physical Therapy

## 2016-05-11 DIAGNOSIS — R600 Localized edema: Secondary | ICD-10-CM

## 2016-05-11 DIAGNOSIS — M25561 Pain in right knee: Secondary | ICD-10-CM | POA: Diagnosis not present

## 2016-05-11 DIAGNOSIS — M25661 Stiffness of right knee, not elsewhere classified: Secondary | ICD-10-CM

## 2016-05-11 DIAGNOSIS — M6281 Muscle weakness (generalized): Secondary | ICD-10-CM
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
## 2016-05-12 DIAGNOSIS — M79671 Pain in right foot: Secondary | ICD-10-CM | POA: Diagnosis not present

## 2016-05-12 DIAGNOSIS — M722 Plantar fascial fibromatosis: Secondary | ICD-10-CM | POA: Diagnosis not present

## 2016-05-14 ENCOUNTER — Ambulatory Visit: Payer: Medicare Other | Admitting: Physical Therapy

## 2016-05-14 ENCOUNTER — Encounter: Payer: Self-pay | Admitting: Physical Therapy

## 2016-05-14 DIAGNOSIS — R600 Localized edema: Secondary | ICD-10-CM | POA: Diagnosis not present

## 2016-05-14 DIAGNOSIS — M25661 Stiffness of right knee, not elsewhere classified: Secondary | ICD-10-CM

## 2016-05-14 DIAGNOSIS — M25561 Pain in right knee: Secondary | ICD-10-CM

## 2016-05-14 DIAGNOSIS — M6281 Muscle weakness (generalized): Secondary | ICD-10-CM
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
## 2016-05-15 DIAGNOSIS — Z96651 Presence of right artificial knee joint: Secondary | ICD-10-CM | POA: Diagnosis not present

## 2016-05-15 DIAGNOSIS — Z471 Aftercare following joint replacement surgery: Secondary | ICD-10-CM | POA: Diagnosis not present

## 2016-05-19 ENCOUNTER — Ambulatory Visit: Payer: Medicare Other | Admitting: *Deleted

## 2016-05-19 DIAGNOSIS — M6281 Muscle weakness (generalized): Secondary | ICD-10-CM

## 2016-05-19 DIAGNOSIS — M25661 Stiffness of right knee, not elsewhere classified: Secondary | ICD-10-CM

## 2016-05-19 DIAGNOSIS — R600 Localized edema: Secondary | ICD-10-CM | POA: Diagnosis not present

## 2016-05-19 DIAGNOSIS — M25561 Pain in right knee: Secondary | ICD-10-CM
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
## 2016-05-21 ENCOUNTER — Ambulatory Visit: Payer: Medicare Other | Admitting: Physical Therapy

## 2016-05-21 ENCOUNTER — Encounter: Payer: Self-pay | Admitting: Physical Therapy

## 2016-05-21 DIAGNOSIS — M25561 Pain in right knee: Secondary | ICD-10-CM

## 2016-05-21 DIAGNOSIS — M25661 Stiffness of right knee, not elsewhere classified: Secondary | ICD-10-CM | POA: Diagnosis not present

## 2016-05-21 DIAGNOSIS — M6281 Muscle weakness (generalized): Secondary | ICD-10-CM

## 2016-05-21 DIAGNOSIS — R600 Localized edema: Secondary | ICD-10-CM | POA: Diagnosis not present
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
## 2016-05-22 ENCOUNTER — Ambulatory Visit (INDEPENDENT_AMBULATORY_CARE_PROVIDER_SITE_OTHER): Payer: Medicare Other | Admitting: Family Medicine

## 2016-05-22 ENCOUNTER — Encounter: Payer: Self-pay | Admitting: Family Medicine

## 2016-05-22 VITALS — BP 117/75 | HR 87 | Temp 97.4°F | Ht 67.0 in | Wt 217.2 lb

## 2016-05-22 DIAGNOSIS — R300 Dysuria: Secondary | ICD-10-CM | POA: Diagnosis not present

## 2016-05-22 DIAGNOSIS — N3001 Acute cystitis with hematuria: Secondary | ICD-10-CM

## 2016-05-22 DIAGNOSIS — A498 Other bacterial infections of unspecified site: Secondary | ICD-10-CM

## 2016-05-22 LAB — MICROSCOPIC EXAMINATION
RBC, UA: >30 /hpf — AB (ref 0–?)
RENAL EPITHEL UA: NONE SEEN /HPF
WBC, UA: >30 /hpf — AB (ref 0–?)

## 2016-05-22 LAB — URINALYSIS, COMPLETE
Bilirubin, UA: NEGATIVE
Nitrite, UA: POSITIVE — AB
PH UA: 5 (ref 5.0–7.5)
Specific Gravity, UA: 1.01 (ref 1.005–1.030)
Urobilinogen, Ur: 1 mg/dL (ref 0.2–1.0)

## 2016-05-22 MED ORDER — SULFAMETHOXAZOLE-TRIMETHOPRIM 800-160 MG PO TABS: 1.0000 | ORAL_TABLET | Freq: Two times a day (BID) | ORAL | 0 refills | Status: DC

## 2016-05-22 NOTE — Progress Notes (Signed)
   HPI  Patient presents today here with dysuria.  Patient was seen and treated for UTI on December 5. He states that he finish the entire course of Cipro with no improvement of symptoms. He describes severe dyspnea dysuria, mild hematuria, suprapubic tenderness, and malaise.  He denies any overt fever or any*tolerating food and fluids normally. He's currently undergoing rehabilitation for his right knee after replacement in October.  PMH: Smoking status noted ROS: Per HPI  Objective: BP 117/75   Pulse 87   Temp 97.4 F (36.3 C) (Oral)   Ht 5\' 7"  (1.702 m)   Wt 217 lb 3.2 oz (98.5 kg)   BMI 34.02 kg/m  Gen: NAD, alert, cooperative with exam HEENT: NCAT CV: RRR, good S1/S2, no murmur Resp: CTABL, no wheezes, non-labored Abd: Soft, no CVA tenderness, mild tenderness to palpation over the suprapubic area Ext: No edema, warm Neuro: Alert and oriented, No gross deficits  Assessment and plan:  # UTI STill symptomatic after adequate treatment with cipro, sent bactrim Culture Urinalysis consistent with infection as well, also hematuria Recommended repeat urinalysis in one month to ensure resolution of hematuria. Will recommend follow-up with urology if not improved Recommended CBC fatigue is not improved after treatment of UTI.   Orders Placed This Encounter  Procedures  . Urine culture  . Microscopic Examination  . Urinalysis, Complete    Meds ordered this encounter  Medications  . DISCONTD: HYDROcodone-acetaminophen (NORCO/VICODIN) 5-325 MG tablet  . sulfamethoxazole-trimethoprim (BACTRIM DS) 800-160 MG tablet    Sig: Take 1 tablet by mouth 2 (two) times daily.    Dispense:  20 tablet    Refill:  0    Laroy Apple, MD [facility] 05/22/2016, 11:50 AM

## 2016-05-22 NOTE — Patient Instructions (Signed)
Great to meet you!  Call or come back if you have any concerns.   I have prescribed bactrim for a UTI. Finish all antibiotics unless we call you with a change in antibiotics

## 2016-05-27 ENCOUNTER — Telehealth: Payer: Self-pay | Admitting: Family Medicine

## 2016-05-27 ENCOUNTER — Ambulatory Visit: Payer: Medicare Other | Admitting: Physical Therapy

## 2016-05-27 ENCOUNTER — Other Ambulatory Visit: Payer: Self-pay | Admitting: Physician Assistant

## 2016-05-27 DIAGNOSIS — R300 Dysuria: Secondary | ICD-10-CM | POA: Diagnosis not present

## 2016-05-27 DIAGNOSIS — N3000 Acute cystitis without hematuria: Secondary | ICD-10-CM | POA: Diagnosis not present

## 2016-05-27 LAB — URINE CULTURE

## 2016-05-27 NOTE — Telephone Encounter (Signed)
Called and left VM.   Pt with pseudomonas uti, no signs of sepsis. Recommended same day urology apt if possible.   Likely needs a picc line. But urology may nhave an alternative.   Laroy Apple, MD [facility] 05/27/2016, 10:02 AM

## 2016-05-27 NOTE — Addendum Note (Signed)
Addended by: Nigel Berthold C on: 05/27/2016 10:04 AM   Modules accepted: Orders

## 2016-05-28 ENCOUNTER — Ambulatory Visit: Payer: Medicare Other | Admitting: Physical Therapy

## 2016-05-28 ENCOUNTER — Encounter: Payer: Self-pay | Admitting: Physical Therapy

## 2016-05-28 DIAGNOSIS — M25661 Stiffness of right knee, not elsewhere classified: Secondary | ICD-10-CM

## 2016-05-28 DIAGNOSIS — M25561 Pain in right knee: Secondary | ICD-10-CM

## 2016-05-28 DIAGNOSIS — M6281 Muscle weakness (generalized): Secondary | ICD-10-CM | POA: Diagnosis not present

## 2016-05-28 DIAGNOSIS — R600 Localized edema: Secondary | ICD-10-CM
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
## 2016-06-02 ENCOUNTER — Ambulatory Visit: Payer: Medicare Other | Attending: Orthopedic Surgery | Admitting: *Deleted

## 2016-06-02 DIAGNOSIS — M25661 Stiffness of right knee, not elsewhere classified: Secondary | ICD-10-CM | POA: Diagnosis not present

## 2016-06-02 DIAGNOSIS — R600 Localized edema: Secondary | ICD-10-CM | POA: Insufficient documentation

## 2016-06-02 DIAGNOSIS — M25561 Pain in right knee: Secondary | ICD-10-CM | POA: Diagnosis not present

## 2016-06-02 DIAGNOSIS — M6281 Muscle weakness (generalized): Secondary | ICD-10-CM | POA: Insufficient documentation
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
## 2016-06-04 ENCOUNTER — Ambulatory Visit: Payer: Medicare Other | Admitting: Physical Therapy

## 2016-06-04 DIAGNOSIS — M25661 Stiffness of right knee, not elsewhere classified: Secondary | ICD-10-CM | POA: Diagnosis not present

## 2016-06-04 DIAGNOSIS — R600 Localized edema: Secondary | ICD-10-CM

## 2016-06-04 DIAGNOSIS — M25561 Pain in right knee: Secondary | ICD-10-CM

## 2016-06-04 DIAGNOSIS — M6281 Muscle weakness (generalized): Secondary | ICD-10-CM
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
## 2016-06-09 ENCOUNTER — Ambulatory Visit: Payer: Medicare Other | Admitting: *Deleted

## 2016-06-09 DIAGNOSIS — M25661 Stiffness of right knee, not elsewhere classified: Secondary | ICD-10-CM | POA: Diagnosis not present

## 2016-06-09 DIAGNOSIS — M25561 Pain in right knee: Secondary | ICD-10-CM

## 2016-06-09 DIAGNOSIS — R600 Localized edema: Secondary | ICD-10-CM

## 2016-06-09 DIAGNOSIS — M6281 Muscle weakness (generalized): Secondary | ICD-10-CM | POA: Diagnosis not present
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
## 2016-06-11 ENCOUNTER — Ambulatory Visit: Payer: Medicare Other | Admitting: Physical Therapy

## 2016-06-11 DIAGNOSIS — M25561 Pain in right knee: Secondary | ICD-10-CM | POA: Diagnosis not present

## 2016-06-11 DIAGNOSIS — M25661 Stiffness of right knee, not elsewhere classified: Secondary | ICD-10-CM | POA: Diagnosis not present

## 2016-06-11 DIAGNOSIS — M6281 Muscle weakness (generalized): Secondary | ICD-10-CM | POA: Diagnosis not present

## 2016-06-11 DIAGNOSIS — R600 Localized edema: Secondary | ICD-10-CM
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
## 2016-06-16 ENCOUNTER — Ambulatory Visit: Payer: Medicare Other | Admitting: *Deleted

## 2016-06-16 DIAGNOSIS — M25561 Pain in right knee: Secondary | ICD-10-CM | POA: Diagnosis not present

## 2016-06-16 DIAGNOSIS — M25661 Stiffness of right knee, not elsewhere classified: Secondary | ICD-10-CM | POA: Diagnosis not present

## 2016-06-16 DIAGNOSIS — R600 Localized edema: Secondary | ICD-10-CM | POA: Diagnosis not present

## 2016-06-16 DIAGNOSIS — M6281 Muscle weakness (generalized): Secondary | ICD-10-CM
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
## 2016-06-18 ENCOUNTER — Encounter: Payer: Medicare Other | Admitting: *Deleted

## 2016-06-22 ENCOUNTER — Ambulatory Visit: Payer: Medicare Other | Admitting: Physical Therapy

## 2016-06-22 ENCOUNTER — Ambulatory Visit: Payer: Medicare Other | Admitting: Family Medicine

## 2016-06-22 DIAGNOSIS — M6281 Muscle weakness (generalized): Secondary | ICD-10-CM

## 2016-06-22 DIAGNOSIS — M25661 Stiffness of right knee, not elsewhere classified: Secondary | ICD-10-CM | POA: Diagnosis not present

## 2016-06-22 DIAGNOSIS — R600 Localized edema: Secondary | ICD-10-CM | POA: Diagnosis not present

## 2016-06-22 DIAGNOSIS — M25561 Pain in right knee: Secondary | ICD-10-CM
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
## 2016-06-25 ENCOUNTER — Ambulatory Visit: Payer: Medicare Other | Admitting: *Deleted

## 2016-06-25 DIAGNOSIS — M25561 Pain in right knee: Secondary | ICD-10-CM

## 2016-06-25 DIAGNOSIS — M25661 Stiffness of right knee, not elsewhere classified: Secondary | ICD-10-CM | POA: Diagnosis not present

## 2016-06-25 DIAGNOSIS — M6281 Muscle weakness (generalized): Secondary | ICD-10-CM

## 2016-06-25 DIAGNOSIS — R600 Localized edema: Secondary | ICD-10-CM
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
## 2016-06-26 ENCOUNTER — Other Ambulatory Visit: Payer: Self-pay

## 2016-06-26 DIAGNOSIS — Z471 Aftercare following joint replacement surgery: Secondary | ICD-10-CM | POA: Diagnosis not present

## 2016-06-26 DIAGNOSIS — Z96651 Presence of right artificial knee joint: Secondary | ICD-10-CM | POA: Diagnosis not present

## 2016-06-26 MED ORDER — DICLOFENAC SODIUM 75 MG PO TBEC: DELAYED_RELEASE_TABLET | ORAL | 1 refills | Status: DC

## 2016-06-30 ENCOUNTER — Other Ambulatory Visit: Payer: Self-pay | Admitting: *Deleted

## 2016-06-30 MED ORDER — DICLOFENAC SODIUM 75 MG PO TBEC: DELAYED_RELEASE_TABLET | ORAL | 3 refills | Status: DC

## 2016-07-03 DIAGNOSIS — R3911 Hesitancy of micturition: Secondary | ICD-10-CM | POA: Diagnosis not present

## 2016-07-03 DIAGNOSIS — C674 Malignant neoplasm of posterior wall of bladder: Secondary | ICD-10-CM | POA: Diagnosis not present

## 2016-07-06 ENCOUNTER — Other Ambulatory Visit: Payer: Self-pay | Admitting: Physician Assistant

## 2016-07-23 ENCOUNTER — Other Ambulatory Visit: Payer: Self-pay | Admitting: Family Medicine

## 2016-07-23 NOTE — Telephone Encounter (Signed)
Last seen December = bradshaw

## 2016-07-31 DIAGNOSIS — N3000 Acute cystitis without hematuria: Secondary | ICD-10-CM | POA: Diagnosis not present

## 2016-07-31 DIAGNOSIS — R8271 Bacteriuria: Secondary | ICD-10-CM | POA: Diagnosis not present

## 2016-08-11 DIAGNOSIS — Z5111 Encounter for antineoplastic chemotherapy: Secondary | ICD-10-CM | POA: Diagnosis not present

## 2016-08-11 DIAGNOSIS — C674 Malignant neoplasm of posterior wall of bladder: Secondary | ICD-10-CM | POA: Diagnosis not present

## 2016-08-20 DIAGNOSIS — C674 Malignant neoplasm of posterior wall of bladder: Secondary | ICD-10-CM | POA: Diagnosis not present

## 2016-08-20 DIAGNOSIS — Z5111 Encounter for antineoplastic chemotherapy: Secondary | ICD-10-CM | POA: Diagnosis not present

## 2016-08-24 IMAGING — CT CT HEAD WO/W CM
4 series · 17 of 30 positions shown, 19 images · IV contrast (iopamidol)
Comparison: None.

CLINICAL DATA: Sudden hearing loss. Rule out acoustic neuroma.
Tinnitus.

Creatinine was obtained on site at [HOSPITAL] at [HOSPITAL].Results: Creatinine 0.9 mg/dL.
EXAM:
CT HEAD WITHOUT AND WITH CONTRAST
TECHNIQUE: Contiguous axial images were obtained from the base of the skull
through the vertex without and with intravenous contrast
CONTRAST:  75mL DIVRS4-H11 IOPAMIDOL (DIVRS4-H11) INJECTION 61%

[Series 2: head w/(date) · axial · 0.45mm/px · z∈[-95,-5]mm · 4 of 32 slices shown]
[im 7/32  brain]
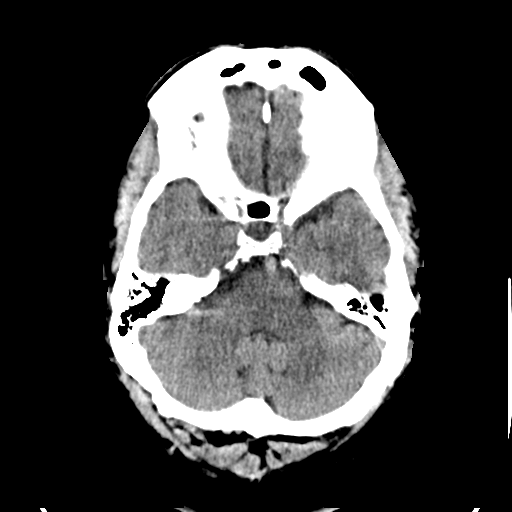
[im 13/32  brain]
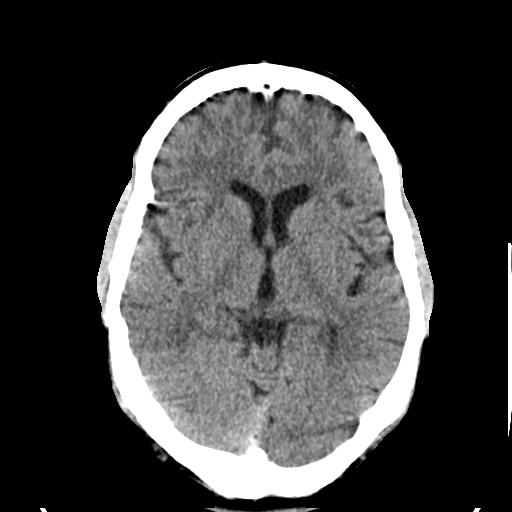
[im 19/32  brain]
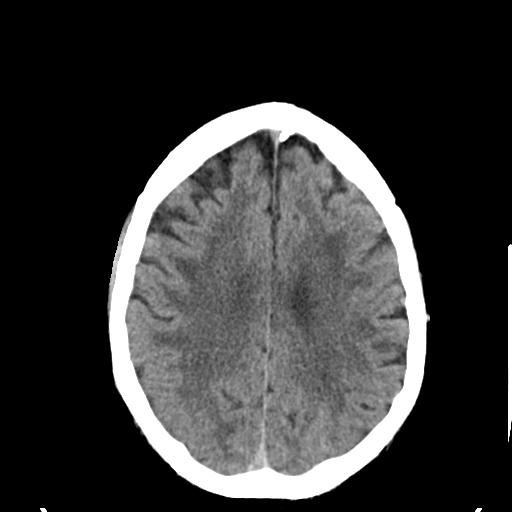
[im 25/32  brain]
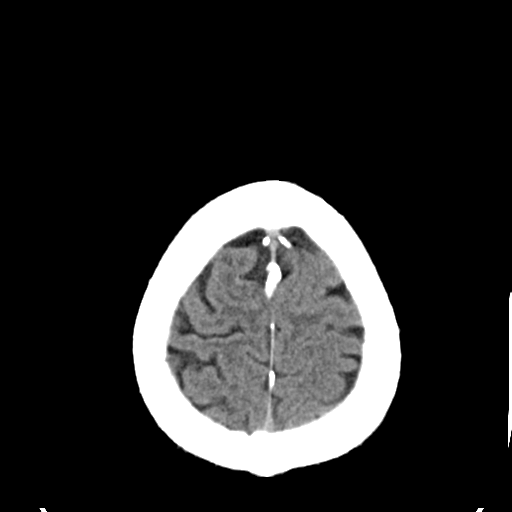

[Series 3: bone · axial · 0.45mm/px · z∈[-111,-75]mm · 3 of 54 slices shown]
[im 6/54  bone]
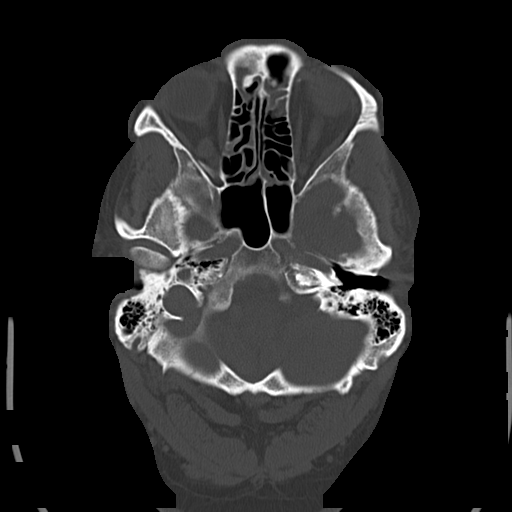
[im 12/54  bone]
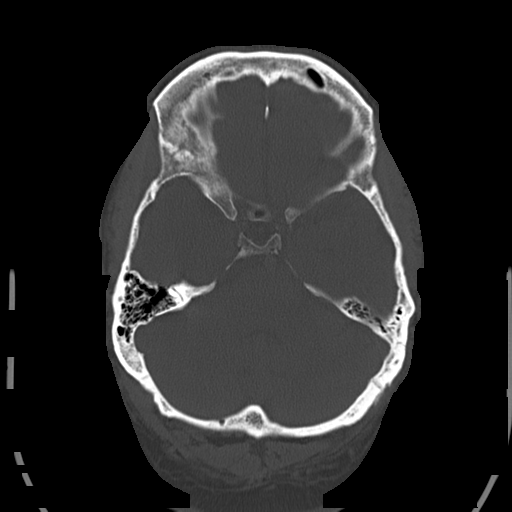
[im 18/54  bone]
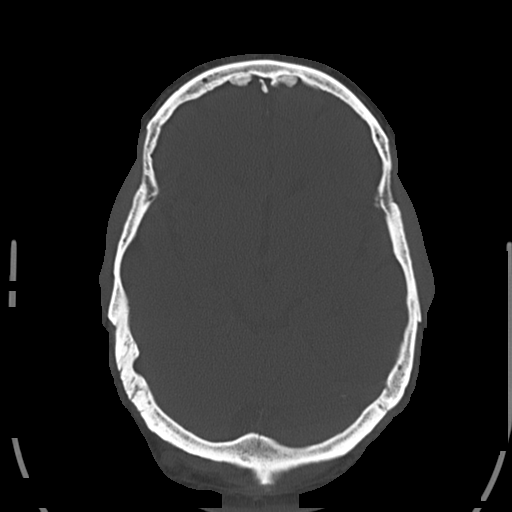

[Series 4: head w/cm · axial · 0.45mm/px · z∈[-115,+5]mm · 5 of 36 slices shown, 7 images]
[im 6/36  brain]
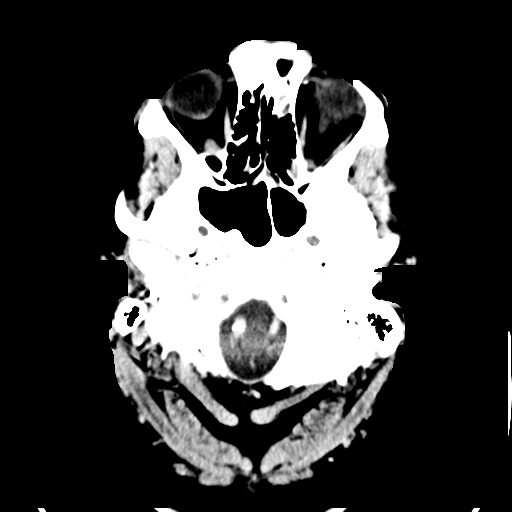
[im 6/36  bone]
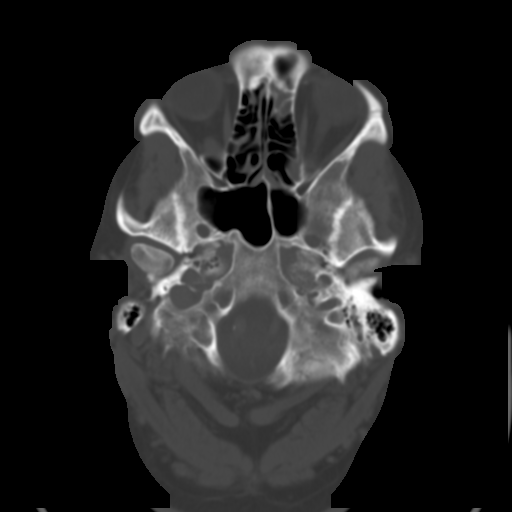
[im 12/36  brain]
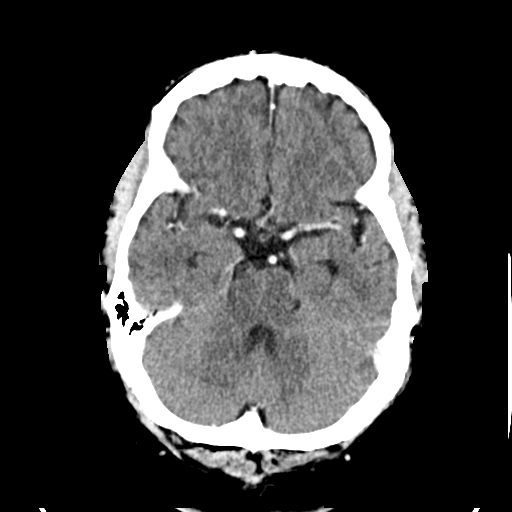
[im 18/36  brain]
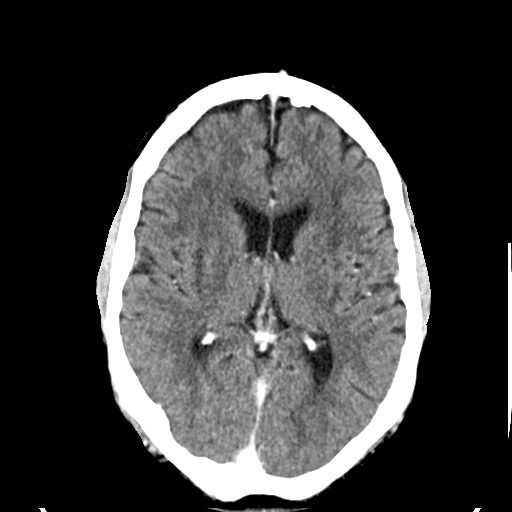
[im 24/36  brain]
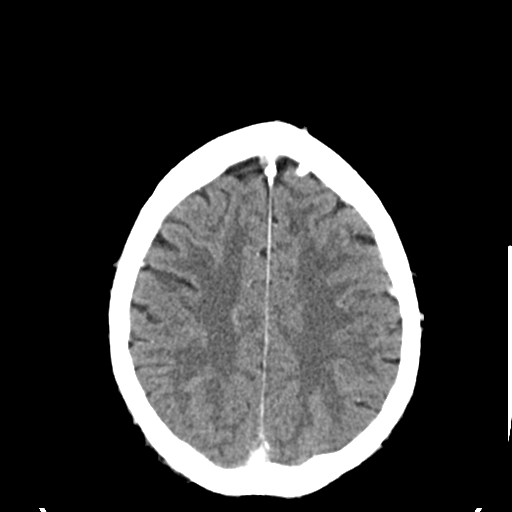
[im 30/36  brain]
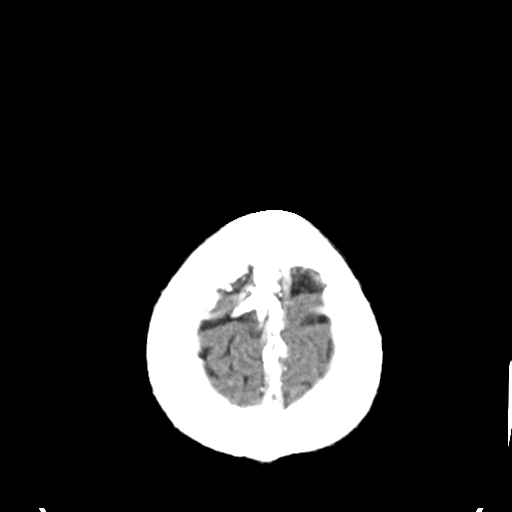
[im 30/36  bone]
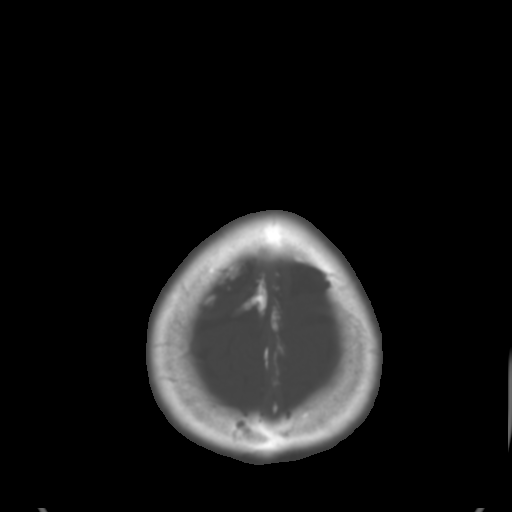

[Series 5: w/(id) · axial · 0.45mm/px · z∈[-130,-82]mm · 5 of 37 slices shown]
[im 7/37  brain]
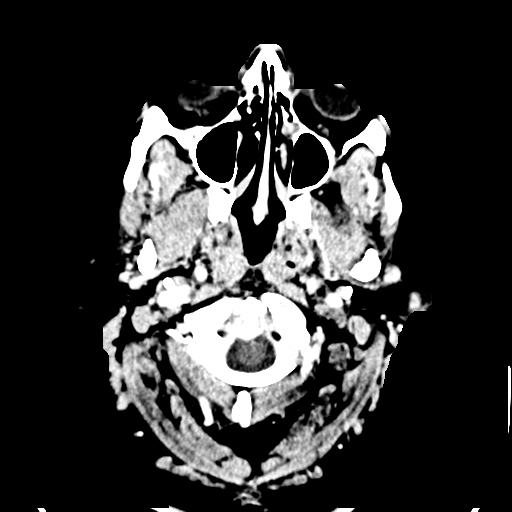
[im 13/37  brain]
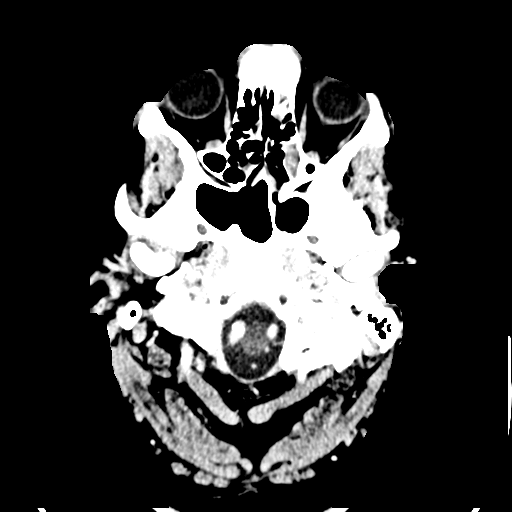
[im 19/37  brain]
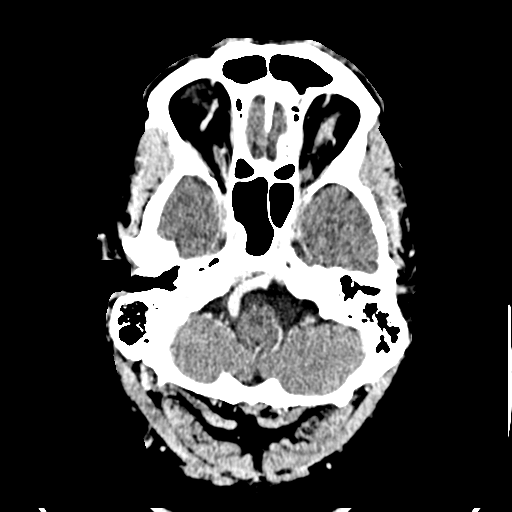
[im 25/37  brain]
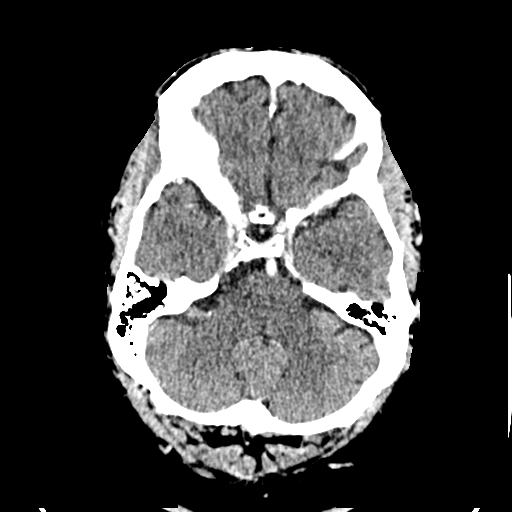
[im 31/37  brain]
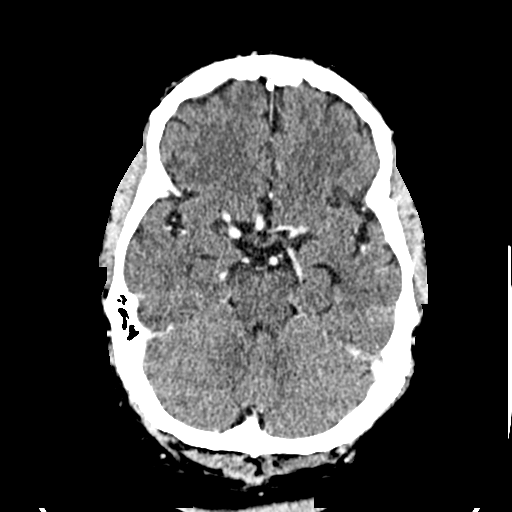

[17 of 30 positions shown; findings below may reference images not displayed]

FINDINGS: Ventricle size normal.  Cerebral volume normal for age.

Patchy hypodensity in the cerebral white matter bilaterally
consistent with chronic microvascular ischemia. Negative for acute
infarct. Negative for hemorrhage or mass.

Postcontrast imaging reveals normal enhancement. No enhancing mass.
Normal arterial and venous enhancement.

The internal auditory canal is normal in caliber bilaterally. No
enhancing mass lesion. MRI is more sensitive than CT to detect
vestibular schwannoma.

Mild mucosal edema in the paranasal sinuses.  No skull lesion.
IMPRESSION: Atrophy and chronic microvascular ischemic changes in the white
matter. No acute intracranial abnormality

No CT signs of vestibular schwannoma.

## 2016-08-27 DIAGNOSIS — C674 Malignant neoplasm of posterior wall of bladder: Secondary | ICD-10-CM | POA: Diagnosis not present

## 2016-08-27 DIAGNOSIS — R300 Dysuria: Secondary | ICD-10-CM | POA: Diagnosis not present

## 2016-09-03 DIAGNOSIS — Z5111 Encounter for antineoplastic chemotherapy: Secondary | ICD-10-CM | POA: Diagnosis not present

## 2016-09-03 DIAGNOSIS — C678 Malignant neoplasm of overlapping sites of bladder: Secondary | ICD-10-CM | POA: Diagnosis not present

## 2016-09-06 ENCOUNTER — Emergency Department (HOSPITAL_COMMUNITY)
Admission: EM | Admit: 2016-09-06 | Discharge: 2016-09-06 | Disposition: A | Discharge status: Home / Self Care | Payer: Medicare Other | Attending: Emergency Medicine | Admitting: Emergency Medicine

## 2016-09-06 ENCOUNTER — Encounter (HOSPITAL_COMMUNITY): Payer: Self-pay | Admitting: Emergency Medicine

## 2016-09-06 DIAGNOSIS — Z87891 Personal history of nicotine dependence: Secondary | ICD-10-CM | POA: Insufficient documentation

## 2016-09-06 DIAGNOSIS — R112 Nausea with vomiting, unspecified: Secondary | ICD-10-CM | POA: Diagnosis not present

## 2016-09-06 DIAGNOSIS — Z79899 Other long term (current) drug therapy: Secondary | ICD-10-CM | POA: Insufficient documentation

## 2016-09-06 DIAGNOSIS — I10 Essential (primary) hypertension: Secondary | ICD-10-CM | POA: Diagnosis not present

## 2016-09-06 DIAGNOSIS — Z8551 Personal history of malignant neoplasm of bladder: Secondary | ICD-10-CM | POA: Insufficient documentation

## 2016-09-06 DIAGNOSIS — R42 Dizziness and giddiness: Secondary | ICD-10-CM

## 2016-09-06 DIAGNOSIS — I6789 Other cerebrovascular disease: Secondary | ICD-10-CM | POA: Diagnosis not present

## 2016-09-06 MED ORDER — LORAZEPAM 0.5 MG PO TABS
0.5000 mg | ORAL_TABLET | Freq: Once | ORAL | Status: AC
  Administered 2016-09-06: 0.5 mg via ORAL
  Filled 2016-09-06: qty 1

## 2016-09-06 MED ORDER — MECLIZINE HCL 12.5 MG PO TABS
25.0000 mg | ORAL_TABLET | Freq: Once | ORAL | Status: AC
  Administered 2016-09-06: 25 mg via ORAL
  Filled 2016-09-06: qty 2

## 2016-09-06 MED ORDER — MECLIZINE HCL 25 MG PO TABS: 25.0000 mg | ORAL_TABLET | Freq: Three times a day (TID) | ORAL | 0 refills | Status: DC | PRN

## 2016-09-06 NOTE — Discharge Instructions (Signed)
Rest. Medication for vertigo. Avoid quick movements.
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
## 2016-09-06 NOTE — ED Triage Notes (Signed)
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  Pt reports dizziness with head movement and nausea with this.  Is not constant.  Began at 1000.  CBG 157 per ems.

## 2016-09-06 NOTE — ED Notes (Signed)
Pt reports that he believes that he has vertigo- since 10 am he has been dizzy- has had vertigo in the past but never like this  See Dr Wendi Snipes in [facility]

## 2016-09-17 ENCOUNTER — Other Ambulatory Visit: Payer: Self-pay | Admitting: Physician Assistant

## 2016-09-30 ENCOUNTER — Ambulatory Visit (INDEPENDENT_AMBULATORY_CARE_PROVIDER_SITE_OTHER): Payer: Medicare Other | Admitting: Physician Assistant

## 2016-09-30 ENCOUNTER — Encounter: Payer: Self-pay | Admitting: Physician Assistant

## 2016-09-30 VITALS — BP 126/84 | HR 69 | Temp 97.0°F | Ht 66.0 in | Wt 230.4 lb

## 2016-09-30 DIAGNOSIS — E7800 Pure hypercholesterolemia, unspecified: Secondary | ICD-10-CM | POA: Diagnosis not present

## 2016-09-30 DIAGNOSIS — N3001 Acute cystitis with hematuria: Secondary | ICD-10-CM

## 2016-09-30 DIAGNOSIS — R350 Frequency of micturition: Secondary | ICD-10-CM

## 2016-09-30 DIAGNOSIS — D1721 Benign lipomatous neoplasm of skin and subcutaneous tissue of right arm: Secondary | ICD-10-CM | POA: Diagnosis not present

## 2016-09-30 DIAGNOSIS — Z0000 Encounter for general adult medical examination without abnormal findings: Secondary | ICD-10-CM

## 2016-09-30 DIAGNOSIS — C679 Malignant neoplasm of bladder, unspecified: Secondary | ICD-10-CM

## 2016-09-30 DIAGNOSIS — D179 Benign lipomatous neoplasm, unspecified: Secondary | ICD-10-CM | POA: Insufficient documentation

## 2016-09-30 LAB — MICROSCOPIC EXAMINATION: RENAL EPITHEL UA: NONE SEEN /HPF

## 2016-09-30 LAB — URINALYSIS, COMPLETE
BILIRUBIN UA: NEGATIVE
Glucose, UA: NEGATIVE
Ketones, UA: NEGATIVE
Nitrite, UA: NEGATIVE
PH UA: 6.5 (ref 5.0–7.5)
PROTEIN UA: NEGATIVE
Specific Gravity, UA: <1.005 — ABNORMAL LOW (ref 1.005–1.030)
UUROB: 0.2 mg/dL (ref 0.2–1.0)

## 2016-09-30 MED ORDER — FOSFOMYCIN TROMETHAMINE 3 G PO PACK: 3.0000 g | PACK | Freq: Once | ORAL | 0 refills | Status: AC

## 2016-09-30 NOTE — Patient Instructions (Signed)

## 2016-10-01 LAB — CBC WITH DIFFERENTIAL/PLATELET
BASOS ABS: 0 10*3/uL (ref 0.0–0.2)
BASOS: 0 %
EOS (ABSOLUTE): 0.2 10*3/uL (ref 0.0–0.4)
Eos: 4 %
Hematocrit: 43.2 % (ref 37.5–51.0)
Hemoglobin: 14.2 g/dL (ref 13.0–17.7)
IMMATURE GRANS (ABS): 0 10*3/uL (ref 0.0–0.1)
IMMATURE GRANULOCYTES: 0 %
LYMPHS: 31 %
Lymphocytes Absolute: 1.9 10*3/uL (ref 0.7–3.1)
MCH: 30.1 pg (ref 26.6–33.0)
MCHC: 32.9 g/dL (ref 31.5–35.7)
MCV: 92 fL (ref 79–97)
MONOS ABS: 0.4 10*3/uL (ref 0.1–0.9)
Monocytes: 7 %
NEUTROS PCT: 58 %
Neutrophils Absolute: 3.5 10*3/uL (ref 1.4–7.0)
PLATELETS: 147 10*3/uL — AB (ref 150–379)
RBC: 4.72 x10E6/uL (ref 4.14–5.80)
RDW: 14.8 % (ref 12.3–15.4)
WBC: 6 10*3/uL (ref 3.4–10.8)

## 2016-10-01 LAB — LIPID PANEL
Chol/HDL Ratio: 7 ratio — ABNORMAL HIGH (ref 0.0–5.0)
Cholesterol, Total: 181 mg/dL (ref 100–199)
HDL: 26 mg/dL — AB (ref >39)
LDL Calculated: 104 mg/dL — ABNORMAL HIGH (ref 0–99)
TRIGLYCERIDES: 253 mg/dL — AB (ref 0–149)
VLDL CHOLESTEROL CAL: 51 mg/dL — AB (ref 5–40)

## 2016-10-01 LAB — CMP14+EGFR
A/G RATIO: 1.6 (ref 1.2–2.2)
ALT: 33 IU/L (ref 0–44)
AST: 47 IU/L — ABNORMAL HIGH (ref 0–40)
Albumin: 4.6 g/dL (ref 3.6–4.8)
Alkaline Phosphatase: 56 IU/L (ref 39–117)
BILIRUBIN TOTAL: 0.6 mg/dL (ref 0.0–1.2)
BUN/Creatinine Ratio: 20 (ref 10–24)
BUN: 16 mg/dL (ref 8–27)
CALCIUM: 9.8 mg/dL (ref 8.6–10.2)
CHLORIDE: 97 mmol/L (ref 96–106)
CO2: 28 mmol/L (ref 18–29)
Creatinine, Ser: 0.8 mg/dL (ref 0.76–1.27)
GFR calc Af Amer: 106 mL/min/{1.73_m2} (ref >59)
GFR, EST NON AFRICAN AMERICAN: 92 mL/min/{1.73_m2} (ref >59)
GLUCOSE: 89 mg/dL (ref 65–99)
Globulin, Total: 2.8 g/dL (ref 1.5–4.5)
POTASSIUM: 4.4 mmol/L (ref 3.5–5.2)
Sodium: 138 mmol/L (ref 134–144)
Total Protein: 7.4 g/dL (ref 6.0–8.5)

## 2016-10-02 ENCOUNTER — Telehealth: Payer: Self-pay | Admitting: Physician Assistant

## 2016-10-02 DIAGNOSIS — E7800 Pure hypercholesterolemia, unspecified: Secondary | ICD-10-CM | POA: Insufficient documentation

## 2016-10-02 DIAGNOSIS — C679 Malignant neoplasm of bladder, unspecified: Secondary | ICD-10-CM | POA: Insufficient documentation

## 2016-10-02 DIAGNOSIS — N3001 Acute cystitis with hematuria: Secondary | ICD-10-CM | POA: Insufficient documentation

## 2016-10-02 LAB — URINE CULTURE

## 2016-10-02 MED ORDER — NITROFURANTOIN MONOHYD MACRO 100 MG PO CAPS: 100.0000 mg | ORAL_CAPSULE | Freq: Two times a day (BID) | ORAL | 0 refills | Status: DC

## 2016-10-02 NOTE — Telephone Encounter (Signed)
[email or web address]
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
## 2016-10-02 NOTE — Telephone Encounter (Signed)
Left detailed message for pt regarding new RX 

## 2016-10-05 ENCOUNTER — Encounter: Payer: Self-pay | Admitting: *Deleted

## 2016-10-05 ENCOUNTER — Telehealth: Payer: Self-pay | Admitting: Physician Assistant

## 2016-10-05 NOTE — Telephone Encounter (Signed)
Aware of results and labs were routed to [facility], urology. ( fax # [phone])

## 2016-10-05 NOTE — Telephone Encounter (Signed)
Aware to call his urologist and results were routed.

## 2016-10-05 NOTE — Telephone Encounter (Signed)
There was no significant infection on the culture, so the symptoms are being created by his bladder. I would recommend he call his urologist for evaluation. Also, can we send this to urology?

## 2016-10-05 NOTE — Telephone Encounter (Signed)
Aware of lab results but he is still having burning and frequency with voiding.  He thinks the last antibiotic did not do much good.  Please send in new script to [facility].

## 2016-10-05 NOTE — Telephone Encounter (Signed)
Left message for patient to call.

## 2016-10-14 ENCOUNTER — Encounter: Payer: Self-pay | Admitting: Physician Assistant

## 2016-10-14 ENCOUNTER — Ambulatory Visit (INDEPENDENT_AMBULATORY_CARE_PROVIDER_SITE_OTHER): Payer: Medicare Other | Admitting: Physician Assistant

## 2016-10-14 VITALS — BP 131/77 | HR 84 | Temp 97.4°F | Ht 66.0 in | Wt 230.2 lb

## 2016-10-14 DIAGNOSIS — R300 Dysuria: Secondary | ICD-10-CM

## 2016-10-14 DIAGNOSIS — N3001 Acute cystitis with hematuria: Secondary | ICD-10-CM

## 2016-10-14 LAB — URINALYSIS, COMPLETE
Bilirubin, UA: NEGATIVE
KETONES UA: NEGATIVE
Nitrite, UA: POSITIVE — AB
Urobilinogen, Ur: 1 mg/dL (ref 0.2–1.0)
pH, UA: 5 (ref 5.0–7.5)

## 2016-10-14 LAB — MICROSCOPIC EXAMINATION
RBC, UA: >30 /hpf — AB (ref 0–?)
Renal Epithel, UA: NONE SEEN /hpf

## 2016-10-14 MED ORDER — FOSFOMYCIN TROMETHAMINE 3 G PO PACK: 3.0000 g | PACK | ORAL | 1 refills | Status: DC

## 2016-10-14 MED ORDER — FOSFOMYCIN TROMETHAMINE 3 G PO PACK: 3.0000 g | PACK | ORAL | 1 refills | Status: AC
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
## 2016-10-14 NOTE — Patient Instructions (Signed)
uti Urinary Tract Infection, Adult A urinary tract infection (UTI) is an infection of any part of the urinary tract, which includes the kidneys, ureters, bladder, and urethra. These organs make, store, and get rid of urine in the body. UTI can be a bladder infection (cystitis) or kidney infection (pyelonephritis). What are the causes? This infection may be caused by fungi, viruses, or bacteria. Bacteria are the most common cause of UTIs. This condition can also be caused by repeated incomplete emptying of the bladder during urination. What increases the risk? This condition is more likely to develop if:  You ignore your need to urinate or hold urine for long periods of time.  You do not empty your bladder completely during urination.  You wipe back to front after urinating or having a bowel movement, if you are male.  You are uncircumcised, if you are male.  You are constipated.  You have a urinary catheter that stays in place (indwelling).  You have a weak defense (immune) system.  You have a medical condition that affects your bowels, kidneys, or bladder.  You have diabetes.  You take antibiotic medicines frequently or for long periods of time, and the antibiotics no longer work well against certain types of infections (antibiotic resistance).  You take medicines that irritate your urinary tract.  You are exposed to chemicals that irritate your urinary tract.  You are male.  What are the signs or symptoms? Symptoms of this condition include:  Fever.  Frequent urination or passing small amounts of urine frequently.  Needing to urinate urgently.  Pain or burning with urination.  Urine that smells bad or unusual.  Cloudy urine.  Pain in the lower abdomen or back.  Trouble urinating.  Blood in the urine.  Vomiting or being less hungry than normal.  Diarrhea or abdominal pain.  Vaginal discharge, if you are male.  How is this diagnosed? This condition  is diagnosed with a medical history and physical exam. You will also need to provide a urine sample to test your urine. Other tests may be done, including:  Blood tests.  Sexually transmitted disease (STD) testing.  If you have had more than one UTI, a cystoscopy or imaging studies may be done to determine the cause of the infections. How is this treated? Treatment for this condition often includes a combination of two or more of the following:  Antibiotic medicine.  Other medicines to treat less common causes of UTI.  Over-the-counter medicines to treat pain.  Drinking enough water to stay hydrated.  Follow these instructions at home:  Take over-the-counter and prescription medicines only as told by your health care provider.  If you were prescribed an antibiotic, take it as told by your health care provider. Do not stop taking the antibiotic even if you start to feel better.  Avoid alcohol, caffeine, tea, and carbonated beverages. They can irritate your bladder.  Drink enough fluid to keep your urine clear or pale yellow.  Keep all follow-up visits as told by your health care provider. This is important.  Make sure to: ? Empty your bladder often and completely. Do not hold urine for long periods of time. ? Empty your bladder before and after sex. ? Wipe from front to back after a bowel movement if you are male. Use each tissue one time when you wipe. Contact a health care provider if:  You have back pain.  You have a fever.  You feel nauseous or vomit.  Your symptoms do   not get better after 3 days.  Your symptoms go away and then return. Get help right away if:  You have severe back pain or lower abdominal pain.  You are vomiting and cannot keep down any medicines or water. This information is not intended to replace advice given to you by your health care provider. Make sure you discuss any questions you have with your health care provider. Document Released:  02/25/2005 Document Revised: 10/30/2015 Document Reviewed: 04/08/2015 Elsevier Interactive Patient Education  2017 Elsevier Inc.  

## 2016-10-20 ENCOUNTER — Other Ambulatory Visit: Payer: Medicare Other

## 2016-10-20 DIAGNOSIS — R300 Dysuria: Secondary | ICD-10-CM | POA: Diagnosis not present

## 2016-10-20 DIAGNOSIS — N3001 Acute cystitis with hematuria: Secondary | ICD-10-CM | POA: Diagnosis not present

## 2016-10-22 ENCOUNTER — Telehealth: Payer: Self-pay

## 2016-10-22 LAB — URINE CULTURE: ORGANISM ID, BACTERIA: NO GROWTH

## 2016-10-22 NOTE — Telephone Encounter (Signed)
Patient called about still having UTI symptoms. States that this has been going on for 6 months and the monurol is not working. Referred to previous office note where pt was told to call his Urologist. Patient states that he has not been in contact with them yet but would call. Patient aware Glenard Haring is out of the office today.

## 2016-10-23 DIAGNOSIS — R350 Frequency of micturition: Secondary | ICD-10-CM | POA: Diagnosis not present

## 2016-10-23 DIAGNOSIS — R300 Dysuria: Secondary | ICD-10-CM | POA: Diagnosis not present

## 2016-10-23 DIAGNOSIS — C674 Malignant neoplasm of posterior wall of bladder: Secondary | ICD-10-CM | POA: Diagnosis not present

## 2016-10-23 DIAGNOSIS — R3915 Urgency of urination: Secondary | ICD-10-CM | POA: Diagnosis not present

## 2016-10-23 NOTE — Telephone Encounter (Signed)
Patient states he went to the urologist this morning. States that there was no infection and was advised to change his diet and see if that helps. FYI

## 2016-10-23 NOTE — Telephone Encounter (Signed)
Noted, check to see if he got in with them.

## 2016-10-23 NOTE — Telephone Encounter (Signed)
[email or web address]

## 2016-10-23 NOTE — Telephone Encounter (Signed)
noted 

## 2016-10-27 ENCOUNTER — Other Ambulatory Visit: Payer: Self-pay | Admitting: Physician Assistant

## 2016-10-28 ENCOUNTER — Encounter: Payer: Self-pay | Admitting: *Deleted

## 2016-10-28 NOTE — Telephone Encounter (Signed)
No response from patient

## 2016-11-09 DIAGNOSIS — C674 Malignant neoplasm of posterior wall of bladder: Secondary | ICD-10-CM | POA: Diagnosis not present

## 2016-11-09 DIAGNOSIS — N5201 Erectile dysfunction due to arterial insufficiency: Secondary | ICD-10-CM | POA: Diagnosis not present

## 2017-02-12 ENCOUNTER — Other Ambulatory Visit: Payer: Self-pay | Admitting: Physician Assistant

## 2017-02-25 ENCOUNTER — Encounter: Payer: Self-pay | Admitting: Family

## 2017-02-25 ENCOUNTER — Ambulatory Visit (INDEPENDENT_AMBULATORY_CARE_PROVIDER_SITE_OTHER): Payer: Medicare Other | Admitting: Family

## 2017-02-25 VITALS — BP 124/77 | HR 73 | Temp 98.1°F | Ht 66.0 in | Wt 230.2 lb

## 2017-02-25 DIAGNOSIS — J209 Acute bronchitis, unspecified: Secondary | ICD-10-CM | POA: Diagnosis not present

## 2017-02-25 MED ORDER — PREDNISONE 10 MG (21) PO TBPK: ORAL_TABLET | ORAL | 0 refills | Status: DC

## 2017-02-25 MED ORDER — FLUTICASONE PROPIONATE 50 MCG/ACT NA SUSP: 2.0000 | Freq: Every day | NASAL | 6 refills | Status: DC

## 2017-02-25 MED ORDER — BENZONATATE 200 MG PO CAPS: 200.0000 mg | ORAL_CAPSULE | Freq: Three times a day (TID) | ORAL | 1 refills | Status: DC | PRN

## 2017-02-25 NOTE — Progress Notes (Signed)
   Subjective:    Patient ID: Billy Rasmussen, male    DOB: 09-Jun-1945, 71 y.o.   MRN: [id number]  Cough  This is a new problem. The current episode started 1 to 4 weeks ago. The problem has been waxing and waning. The problem occurs every few minutes. The cough is productive of sputum. Associated symptoms include myalgias, postnasal drip, a sore throat and wheezing. Pertinent negatives include no chills, ear congestion, ear pain, fever, headaches, nasal congestion, rhinorrhea or shortness of breath. The symptoms are aggravated by lying down. He has tried rest and OTC cough suppressant for the symptoms. The treatment provided mild relief. There is no history of asthma or COPD.      Review of Systems  Constitutional: Negative for chills and fever.  HENT: Positive for postnasal drip and sore throat. Negative for ear pain and rhinorrhea.   Respiratory: Positive for cough and wheezing. Negative for shortness of breath.   Musculoskeletal: Positive for myalgias.  Neurological: Negative for headaches.  All other systems reviewed and are negative.      Objective:   Physical Exam  Constitutional: He is oriented to person, place, and time. He appears well-developed and well-nourished. No distress.  HENT:  Head: Normocephalic.  Right Ear: External ear normal.  Left Ear: External ear normal.  Nose: Mucosal edema and rhinorrhea present.  Mouth/Throat: Oropharynx is clear and moist.  Eyes: Pupils are equal, round, and reactive to light. Right eye exhibits no discharge. Left eye exhibits no discharge.  Neck: Normal range of motion. Neck supple. No thyromegaly present.  Cardiovascular: Normal rate, regular rhythm, normal heart sounds and intact distal pulses.   No murmur heard. Pulmonary/Chest: Effort normal and breath sounds normal. No respiratory distress. He has no wheezes.  Intermittent nonproductive  Abdominal: Soft. Bowel sounds are normal. He exhibits no distension. There is no tenderness.    Musculoskeletal: Normal range of motion. He exhibits no edema or tenderness.  Neurological: He is alert and oriented to person, place, and time.  Skin: Skin is warm and dry. No rash noted. No erythema.  Psychiatric: He has a normal mood and affect. His behavior is normal. Judgment and thought content normal.  Vitals reviewed.     BP 124/77   Pulse 73   Temp 98.1 F (36.7 C) (Oral)   Ht 5\' 6"  (1.676 m)   Wt 230 lb 3.2 oz (104.4 kg)   BMI 37.16 kg/m      Assessment & Plan:  1. Acute bronchitis, unspecified organism - Take meds as prescribed - Use a cool mist humidifier  -Force fluids -For any cough or congestion  Use plain Mucinex- regular strength or max strength is fine -For fever or aces or pains- take tylenol or ibuprofen appropriate for age and weight. -Throat lozenges if help -New toothbrush in 3 days - predniSONE (STERAPRED UNI-PAK 21 TAB) 10 MG (21) TBPK tablet; Use as directed  Dispense: 21 tablet; Refill: 0 - benzonatate (TESSALON) 200 MG capsule; Take 1 capsule (200 mg total) by mouth 3 (three) times daily as needed.  Dispense: 30 capsule; Refill: 1 - fluticasone (FLONASE) 50 MCG/ACT nasal spray; Place 2 sprays into both nostrils daily.  Dispense: 16 g; Refill: [place], [name]

## 2017-02-25 NOTE — Patient Instructions (Signed)

## 2017-03-03 DIAGNOSIS — C674 Malignant neoplasm of posterior wall of bladder: Secondary | ICD-10-CM | POA: Diagnosis not present

## 2017-03-19 ENCOUNTER — Other Ambulatory Visit: Payer: Self-pay

## 2017-03-19 DIAGNOSIS — N3090 Cystitis, unspecified without hematuria: Secondary | ICD-10-CM | POA: Diagnosis not present

## 2017-03-19 DIAGNOSIS — N3289 Other specified disorders of bladder: Secondary | ICD-10-CM | POA: Diagnosis not present

## 2017-03-19 DIAGNOSIS — N3020 Other chronic cystitis without hematuria: Secondary | ICD-10-CM | POA: Diagnosis not present

## 2017-03-19 DIAGNOSIS — N3030 Trigonitis without hematuria: Secondary | ICD-10-CM | POA: Diagnosis not present

## 2017-03-19 DIAGNOSIS — C674 Malignant neoplasm of posterior wall of bladder: Secondary | ICD-10-CM | POA: Diagnosis not present

## 2017-04-08 ENCOUNTER — Ambulatory Visit (INDEPENDENT_AMBULATORY_CARE_PROVIDER_SITE_OTHER): Payer: Medicare Other | Admitting: Family Medicine

## 2017-04-08 ENCOUNTER — Encounter: Payer: Self-pay | Admitting: Family Medicine

## 2017-04-08 VITALS — BP 114/79 | HR 66 | Temp 97.6°F | Ht 66.0 in | Wt 231.0 lb

## 2017-04-08 DIAGNOSIS — J40 Bronchitis, not specified as acute or chronic: Secondary | ICD-10-CM | POA: Diagnosis not present

## 2017-04-08 MED ORDER — AZITHROMYCIN 250 MG PO TABS: ORAL_TABLET | ORAL | 0 refills | Status: DC

## 2017-04-08 NOTE — Progress Notes (Signed)
BP 114/79   Pulse 66   Temp 97.6 F (36.4 C) (Oral)   Ht 5\' 6"  (1.676 m)   Wt 231 lb (104.8 kg)   BMI 37.28 kg/m    Subjective:    Patient ID: Billy Rasmussen, male    DOB: Jan 03, 1946, 71 y.o.   MRN: [id number]  HPI: Billy Rasmussen is a 71 y.o. male presenting on 04/08/2017 for Cough, chest congestion   HPI Chest congestion and cough Patient comes in complaining of 2 weeks worth of chest congestion and cough that just does not seem to be clearing.  He has been using Flonase and Mucinex and other things without much success.  He says about a week ago he had a fever of 99 and since then he has been having some sweats at night.  His cough has been productive of yellow-green sputum.  He denies any wheezing or shortness of breath.  He just feels like it is not clearing  Relevant past medical, surgical, family and social history reviewed and updated as indicated. Interim medical history since our last visit reviewed. Allergies and medications reviewed and updated.  Review of Systems  Constitutional: Positive for fever. Negative for chills.  HENT: Positive for congestion, postnasal drip, rhinorrhea, sinus pressure, sneezing and sore throat. Negative for ear discharge, ear pain and voice change.   Eyes: Negative for pain, discharge, redness and visual disturbance.  Respiratory: Positive for cough. Negative for shortness of breath and wheezing.   Cardiovascular: Negative for chest pain and leg swelling.  Musculoskeletal: Negative for back pain and gait problem.  Skin: Negative for rash.  All other systems reviewed and are negative.   Per HPI unless specifically indicated above        Objective:    BP 114/79   Pulse 66   Temp 97.6 F (36.4 C) (Oral)   Ht 5\' 6"  (1.676 m)   Wt 231 lb (104.8 kg)   BMI 37.28 kg/m   Wt Readings from Last 3 Encounters:  04/08/17 231 lb (104.8 kg)  02/25/17 230 lb 3.2 oz (104.4 kg)  10/14/16 230 lb 3.2 oz (104.4 kg)    Physical Exam    Constitutional: He is oriented to person, place, and time. He appears well-developed and well-nourished. No distress.  HENT:  Right Ear: Tympanic membrane, external ear and ear canal normal.  Left Ear: Tympanic membrane, external ear and ear canal normal.  Nose: Mucosal edema and rhinorrhea present. No sinus tenderness. No epistaxis. Right sinus exhibits no maxillary sinus tenderness and no frontal sinus tenderness. Left sinus exhibits no maxillary sinus tenderness and no frontal sinus tenderness.  Mouth/Throat: Uvula is midline and mucous membranes are normal. Posterior oropharyngeal edema and posterior oropharyngeal erythema present. No oropharyngeal exudate or tonsillar abscesses.  Eyes: Conjunctivae and EOM are normal. Pupils are equal, round, and reactive to light. No scleral icterus.  Neck: Neck supple. No thyromegaly present.  Cardiovascular: Normal rate, regular rhythm, normal heart sounds and intact distal pulses.  No murmur heard. Pulmonary/Chest: Effort normal and breath sounds normal. No respiratory distress. He has no wheezes. He has no rales.  Musculoskeletal: Normal range of motion. He exhibits no edema.  Lymphadenopathy:    He has no cervical adenopathy.  Neurological: He is alert and oriented to person, place, and time. Coordination normal.  Skin: Skin is warm and dry. No rash noted. He is not diaphoretic.  Psychiatric: He has a normal mood and affect. His behavior is normal.  Nursing note  and vitals reviewed.     Assessment & Plan:   Problem List Items Addressed This Visit    None    Visit Diagnoses    Bronchitis    -  Primary   Relevant Medications   azithromycin (ZITHROMAX) 250 MG tablet       Follow up plan: No Follow-up on file.  Counseling provided for all of the vaccine components No orders of the defined types were placed in this encounter.   Caryl Pina, MD [facility] 04/08/2017, 9:36 AM

## 2017-04-15 ENCOUNTER — Ambulatory Visit (INDEPENDENT_AMBULATORY_CARE_PROVIDER_SITE_OTHER): Payer: Medicare Other | Admitting: *Deleted

## 2017-04-15 DIAGNOSIS — Z23 Encounter for immunization: Secondary | ICD-10-CM

## 2017-06-02 DIAGNOSIS — C674 Malignant neoplasm of posterior wall of bladder: Secondary | ICD-10-CM | POA: Diagnosis not present

## 2017-07-15 DIAGNOSIS — R300 Dysuria: Secondary | ICD-10-CM | POA: Diagnosis not present

## 2017-07-22 ENCOUNTER — Other Ambulatory Visit: Payer: Self-pay | Admitting: Physician Assistant

## 2017-07-24 ENCOUNTER — Other Ambulatory Visit: Payer: Self-pay | Admitting: Physician Assistant

## 2017-08-02 ENCOUNTER — Ambulatory Visit (INDEPENDENT_AMBULATORY_CARE_PROVIDER_SITE_OTHER): Payer: Medicare Other | Admitting: Family Medicine

## 2017-08-02 ENCOUNTER — Encounter: Payer: Self-pay | Admitting: Family Medicine

## 2017-08-02 VITALS — BP 138/86 | HR 88 | Temp 97.5°F | Ht 66.0 in | Wt 241.2 lb

## 2017-08-02 DIAGNOSIS — R05 Cough: Secondary | ICD-10-CM | POA: Diagnosis not present

## 2017-08-02 DIAGNOSIS — R3911 Hesitancy of micturition: Secondary | ICD-10-CM | POA: Diagnosis not present

## 2017-08-02 DIAGNOSIS — R059 Cough, unspecified: Secondary | ICD-10-CM

## 2017-08-02 LAB — MICROSCOPIC EXAMINATION
Bacteria, UA: NONE SEEN
Renal Epithel, UA: NONE SEEN /hpf

## 2017-08-02 LAB — URINALYSIS, COMPLETE
Bilirubin, UA: NEGATIVE
GLUCOSE, UA: NEGATIVE
Ketones, UA: NEGATIVE
NITRITE UA: NEGATIVE
PROTEIN UA: NEGATIVE
Specific Gravity, UA: 1.015 (ref 1.005–1.030)
Urobilinogen, Ur: 0.2 mg/dL (ref 0.2–1.0)
pH, UA: 7.5 (ref 5.0–7.5)

## 2017-08-02 MED ORDER — METHYLPREDNISOLONE ACETATE 80 MG/ML IJ SUSP
80.0000 mg | Freq: Once | INTRAMUSCULAR | Status: AC
  Administered 2017-08-02: 80 mg via INTRAMUSCULAR

## 2017-08-02 MED ORDER — TAMSULOSIN HCL 0.4 MG PO CAPS: 0.4000 mg | ORAL_CAPSULE | Freq: Every day | ORAL | 3 refills | Status: DC

## 2017-08-02 NOTE — Progress Notes (Signed)
   HPI  Patient presents today here with cough and urinary hesitancy  Pt has Hx of bladder ca and states his last scope was January.  He has urinary hesitancy, then urethral burning. He states that he has had prostate exams and PSA checked by urology. He remembers trying flomax but cannot remember if it helped.   1 day of cough, clear nasal dc, and ST. No fever, chill, sweats.   Tolerating PO like usual.    PMH: Smoking status noted ROS: Per HPI  Objective: BP 138/86   Pulse 88   Temp (!) 97.5 F (36.4 C) (Oral)   Ht 5\' 6"  (1.676 m)   Wt 241 lb 3.2 oz (109.4 kg)   SpO2 96%   BMI 38.93 kg/m  Gen: NAD, alert, cooperative with exam HEENT: NCAT, no Sinus TTP CV: RRR, good S1/S2, no murmur Resp: CTABL, no wheezes, non-labored Ext: No edema, warm Neuro: Alert and oriented, No gross deficits  Assessment and plan:  # Cough Mild., No concerning exam findings or serious symptoms.  Likely viral/bronchitis developing IM depo medrol- call back if not improving in 1 week.   # Urinary hesitancy Pt with multiple cystoscopys in his Hx Possible stricture, possible BPH Flomax, recommended discuss with urology.    Orders Placed This Encounter  Procedures  . Urine Culture  . Urinalysis, Complete    Meds ordered this encounter  Medications  . methylPREDNISolone acetate (DEPO-MEDROL) injection 80 mg  . tamsulosin (FLOMAX) 0.4 MG CAPS capsule    Sig: Take 1 capsule (0.4 mg total) by mouth daily.    Dispense:  30 capsule    Refill:  [place], MD [place] 08/02/2017, 1:43 PM

## 2017-08-03 LAB — URINE CULTURE: Organism ID, Bacteria: NO GROWTH

## 2017-08-04 ENCOUNTER — Telehealth: Payer: Self-pay | Admitting: Physician Assistant

## 2017-08-05 ENCOUNTER — Other Ambulatory Visit: Payer: Self-pay | Admitting: Family Medicine

## 2017-08-05 MED ORDER — AZITHROMYCIN 250 MG PO TABS
ORAL_TABLET | ORAL | 0 refills | Status: DC
Start: 2017-08-05 — End: 2017-11-05

## 2017-08-05 NOTE — Telephone Encounter (Signed)
Patient aware.

## 2017-08-05 NOTE — Telephone Encounter (Signed)
PT with urinary hesitancy, started on flomax now with no improvement. Recommend urology follow up.   Titrate flomax to 2 capsules daily   Laroy Apple, MD [facility] 08/05/2017, 7:45 AM

## 2017-08-05 NOTE — Telephone Encounter (Signed)
Patient aware and verbalizes understanding. States he was not calling about the flomax .  Was calling about his cough and nasal congestion that has not gotten any better since being seen by Wendi Snipes 3/4.  No chills, sweats, or fever.  Wanting something sent into the pharmacy.  Would like a call back number on his cell- [phone]  Please advise.

## 2017-08-05 NOTE — Telephone Encounter (Signed)
Azithro sent in.   Laroy Apple, MD [facility] 08/05/2017, 11:01 AM

## 2017-08-13 ENCOUNTER — Telehealth: Payer: Self-pay | Admitting: Physician Assistant

## 2017-09-01 DIAGNOSIS — N401 Enlarged prostate with lower urinary tract symptoms: Secondary | ICD-10-CM | POA: Diagnosis not present

## 2017-09-01 DIAGNOSIS — Z8551 Personal history of malignant neoplasm of bladder: Secondary | ICD-10-CM | POA: Diagnosis not present

## 2017-09-01 DIAGNOSIS — R3912 Poor urinary stream: Secondary | ICD-10-CM | POA: Diagnosis not present

## 2017-09-30 ENCOUNTER — Other Ambulatory Visit: Payer: Self-pay | Admitting: Physician Assistant

## 2017-11-05 ENCOUNTER — Ambulatory Visit (INDEPENDENT_AMBULATORY_CARE_PROVIDER_SITE_OTHER): Payer: Medicare Other | Admitting: Family Medicine

## 2017-11-05 ENCOUNTER — Encounter: Payer: Self-pay | Admitting: Family Medicine

## 2017-11-05 VITALS — BP 141/82 | HR 91 | Temp 96.9°F | Ht 66.0 in | Wt 234.0 lb

## 2017-11-05 DIAGNOSIS — J0100 Acute maxillary sinusitis, unspecified: Secondary | ICD-10-CM

## 2017-11-05 MED ORDER — CEFDINIR 300 MG PO CAPS: 300.0000 mg | ORAL_CAPSULE | Freq: Two times a day (BID) | ORAL | 0 refills | Status: DC

## 2017-11-05 NOTE — Progress Notes (Signed)
   HPI  Patient presents today for concern for sinus infection.  Patient reports 2 to 3 days of nasal congestion, facial pressure, sore throat, cough. He is tolerating food and fluids like usual. No fever, chills, sweats. This started after spending quite a bit of time in the basement. He has not tried [facility].  He has tried DayQuil.  He forgot his blood pressure medication today  PMH: Smoking status noted ROS: Per HPI  Objective: BP (!) 141/82   Pulse 91   Temp (!) 96.9 F (36.1 C) (Oral)   Ht 5\' 6"  (1.676 m)   Wt 234 lb (106.1 kg)   BMI 37.77 kg/m  Gen: NAD, alert, cooperative with exam HEENT: NCAT, loud sinus pressure over the maxillary sinuses bilaterally, TMs normal bilaterally, oropharynx moist and clear, nares with boggy mucosa CV: RRR, good S1/S2, no murmur Resp: CTABL, no wheezes, non-labored Ext: No edema, warm Neuro: Alert and oriented, No gross deficits  Assessment and plan:  #Acute maxillary sinusitis Patient is early in the course, however he does have characteristic signs and symptoms Start Omnicef Supportive care discussed Return to clinic as needed   Meds ordered this encounter  Medications  . cefdinir (OMNICEF) 300 MG capsule    Sig: Take 1 capsule (300 mg total) by mouth 2 (two) times daily. 1 po BID    Dispense:  20 capsule    Refill:  [place], MD [facility] 11/05/2017, 2:02 PM

## 2017-11-05 NOTE — Patient Instructions (Signed)
Great to see you!   Sinusitis, Adult Sinusitis is soreness and inflammation of your sinuses. Sinuses are hollow spaces in the bones around your face. They are located:  Around your eyes.  In the middle of your forehead.  Behind your nose.  In your cheekbones.  Your sinuses and nasal passages are lined with a stringy fluid (mucus). Mucus normally drains out of your sinuses. When your nasal tissues get inflamed or swollen, the mucus can get trapped or blocked so air cannot flow through your sinuses. This lets bacteria, viruses, and funguses grow, and that leads to infection. Follow these instructions at home: Medicines  Take, use, or apply over-the-counter and prescription medicines only as told by your doctor. These may include nasal sprays.  If you were prescribed an antibiotic medicine, take it as told by your doctor. Do not stop taking the antibiotic even if you start to feel better. Hydrate and Humidify  Drink enough water to keep your pee (urine) clear or pale yellow.  Use a cool mist humidifier to keep the humidity level in your home above 50%.  Breathe in steam for 10-15 minutes, 3-4 times a day or as told by your doctor. You can do this in the bathroom while a hot shower is running.  Try not to spend time in cool or dry air. Rest  Rest as much as possible.  Sleep with your head raised (elevated).  Make sure to get enough sleep each night. General instructions  Put a warm, moist washcloth on your face 3-4 times a day or as told by your doctor. This will help with discomfort.  Wash your hands often with soap and water. If there is no soap and water, use hand sanitizer.  Do not smoke. Avoid being around people who are smoking (secondhand smoke).  Keep all follow-up visits as told by your doctor. This is important. Contact a doctor if:  You have a fever.  Your symptoms get worse.  Your symptoms do not get better within 10 days. Get help right away if:  You  have a very bad headache.  You cannot stop throwing up (vomiting).  You have pain or swelling around your face or eyes.  You have trouble seeing.  You feel confused.  Your neck is stiff.  You have trouble breathing. This information is not intended to replace advice given to you by your health care provider. Make sure you discuss any questions you have with your health care provider. Document Released: 11/04/2007 Document Revised: 01/12/2016 Document Reviewed: 03/13/2015 Elsevier Interactive Patient Education  2018 Elsevier Inc.  

## 2017-11-11 ENCOUNTER — Telehealth: Payer: Self-pay | Admitting: Physician Assistant

## 2017-11-11 ENCOUNTER — Ambulatory Visit (INDEPENDENT_AMBULATORY_CARE_PROVIDER_SITE_OTHER): Payer: Medicare Other | Admitting: Pediatrics

## 2017-11-11 ENCOUNTER — Encounter: Payer: Self-pay | Admitting: Pediatrics

## 2017-11-11 VITALS — BP 118/85 | HR 83 | Temp 97.7°F | Resp 22 | Ht 66.0 in | Wt 234.8 lb

## 2017-11-11 DIAGNOSIS — J069 Acute upper respiratory infection, unspecified: Secondary | ICD-10-CM | POA: Diagnosis not present

## 2017-11-11 DIAGNOSIS — R05 Cough: Secondary | ICD-10-CM | POA: Diagnosis not present

## 2017-11-11 DIAGNOSIS — R062 Wheezing: Secondary | ICD-10-CM | POA: Diagnosis not present

## 2017-11-11 DIAGNOSIS — R059 Cough, unspecified: Secondary | ICD-10-CM

## 2017-11-11 MED ORDER — PREDNISONE 20 MG PO TABS: ORAL_TABLET | ORAL | 0 refills | Status: DC

## 2017-11-11 MED ORDER — ALBUTEROL SULFATE HFA 108 (90 BASE) MCG/ACT IN AERS: 2.0000 | INHALATION_SPRAY | Freq: Four times a day (QID) | RESPIRATORY_TRACT | 0 refills | Status: DC | PRN

## 2017-11-11 NOTE — Patient Instructions (Signed)
Fever reducer and headache: tylenol and ibuprofen, can take together or alternating   Sinus pressure:  Nasal steroid such as flonase/fluticaone or nasocort daily Can also take daily antihistamine such as loratadine/claritin or cetirizine/zyrtec  Sinus rinses/irritation: [place] or similar with distilled water 2-3 times a day to clear out sinuses or Normal saline nasal spray  Sore throat:  Throat lozenges chloroseptic spray  Stick with bland foods Drink lots of fluids  

## 2017-11-11 NOTE — Progress Notes (Signed)
  Subjective:   Patient ID: Billy Rasmussen, male    DOB: 09-09-1945, 72 y.o.   MRN: [id number] CC: Cough; Shortness of Breath; and Wheezing  HPI: Billy Rasmussen is a 72 y.o. male   Has been sick for last 2 weeks.  Started on antibiotics 1 week ago for sinusitis.  Still has several days left.  In the last couple days has noticed more tightness in his chest with taking deep breath.  Feels like he is wheezing more than usual.  He has had to be on albuterol the past, nothing recently.  Relevant past medical, surgical, family and social history reviewed. Allergies and medications reviewed and updated. Social History   Tobacco Use  Smoking Status Former Smoker  . Packs/day: 2.00  . Years: 40.00  . Pack years: 80.00  . Types: Cigarettes  . Last attempt to quit: 06/01/1997  . Years since quitting: 20.4  Smokeless Tobacco Never Used   ROS: Per HPI   Objective:    BP 118/85   Pulse 83   Temp 97.7 F (36.5 C) (Oral)   Resp (!) 22   Ht 5\' 6"  (1.676 m)   Wt 234 lb 12.8 oz (106.5 kg)   SpO2 97%   BMI 37.90 kg/m   Wt Readings from Last 3 Encounters:  11/11/17 234 lb 12.8 oz (106.5 kg)  11/05/17 234 lb (106.1 kg)  08/02/17 241 lb 3.2 oz (109.4 kg)    Gen: NAD, alert, cooperative with exam, NCAT EYES: EOMI, no conjunctival injection, or no icterus ENT:  TMs pearly gray b/l, OP without erythema LYMPH: no cervical LAD CV: NRRR, normal S1/S2, no murmur, distal pulses 2+ b/l Resp: Moving air well, end expiration wheeze bilaterally, comfortable WOB Ext: No edema, warm Neuro: Alert and oriented, strength equal b/l UE and LE, coordination grossly normal MSK: normal muscle bulk  Assessment & Plan:  Roye was seen today for cough, shortness of breath and wheezing.  Diagnoses and all orders for this visit:  Wheezing Start below, return precautions discussed. -     predniSONE (DELTASONE) 20 MG tablet; 2 po at same time daily for 3 days -     albuterol (PROVENTIL HFA;VENTOLIN HFA) 108  (90 Base) MCG/ACT inhaler; Inhale 2 puffs into the lungs every 6 (six) hours as needed for wheezing or shortness of breath.  Cough  Acute URI Discussed symptomatic care, start sinus rinses, Flonase nasal steroid spray.  Follow up plan: Return if symptoms worsen or fail to improve. Assunta Found, MD [place]

## 2017-11-18 ENCOUNTER — Other Ambulatory Visit: Payer: Self-pay | Admitting: Physician Assistant

## 2017-11-20 DIAGNOSIS — R300 Dysuria: Secondary | ICD-10-CM | POA: Insufficient documentation

## 2017-11-22 ENCOUNTER — Ambulatory Visit (INDEPENDENT_AMBULATORY_CARE_PROVIDER_SITE_OTHER): Payer: Medicare Other | Admitting: Physician Assistant

## 2017-11-22 ENCOUNTER — Encounter: Payer: Self-pay | Admitting: Physician Assistant

## 2017-11-22 VITALS — BP 118/77 | HR 103 | Temp 99.0°F | Ht 66.0 in | Wt 234.6 lb

## 2017-11-22 DIAGNOSIS — J209 Acute bronchitis, unspecified: Secondary | ICD-10-CM | POA: Diagnosis not present

## 2017-11-22 DIAGNOSIS — R828 Abnormal findings on cytological and histological examination of urine: Secondary | ICD-10-CM | POA: Diagnosis not present

## 2017-11-22 DIAGNOSIS — R300 Dysuria: Secondary | ICD-10-CM | POA: Diagnosis not present

## 2017-11-22 DIAGNOSIS — D090 Carcinoma in situ of bladder: Secondary | ICD-10-CM | POA: Diagnosis not present

## 2017-11-22 MED ORDER — CEFDINIR 300 MG PO CAPS: 300.0000 mg | ORAL_CAPSULE | Freq: Two times a day (BID) | ORAL | 0 refills | Status: DC

## 2017-11-22 MED ORDER — PREDNISONE 10 MG (48) PO TBPK: ORAL_TABLET | ORAL | 0 refills | Status: DC

## 2017-11-22 NOTE — Progress Notes (Signed)
BP 118/77   Pulse (!) 103   Temp 99 F (37.2 C) (Oral)   Ht 5\' 6"  (1.676 m)   Wt 234 lb 9.6 oz (106.4 kg)   SpO2 93%   BMI 37.87 kg/m    Subjective:    Patient ID: Billy Rasmussen, male    DOB: [birth year], 72 y.o.   MRN: [id number]  HPI: Billy Rasmussen is a 72 y.o. male presenting on 11/22/2017 for Cough  Patient with several days of progressing upper respiratory and bronchial symptoms. Initially there was more upper respiratory congestion. This progressed to having significant cough that is productive throughout the day and severe at night. There is occasional wheezing after coughing. Sometimes there is slight dyspnea on exertion. It is productive mucus that is yellow in color. Denies any blood.   Past Medical History:  Diagnosis Date  . Arthritis    knees, shoulders, elbows  . Bladder cancer [facility])    urologist-  dr Junious Silk  . Diverticulosis of colon   . Fibromyalgia   . History of blood transfusion   . History of bronchitis   . History of diverticulitis of colon   . History of gastric ulcer   . Hypertension   . Incomplete left bundle branch block   . Lower urinary tract symptoms (LUTS)   . OSA (obstructive sleep apnea)    NON-COMPLIANT  CPAP  --- BUT PT USES OXYGEN AT NIGHT 2.5L VIA [place] (PT'S DECISION)  . PONV (postoperative nausea and vomiting)   . Tinnitus   . Urinary frequency   . Wears glasses   . Wears partial dentures    Relevant past medical, surgical, family and social history reviewed and updated as indicated. Interim medical history since our last visit reviewed. Allergies and medications reviewed and updated. DATA REVIEWED: CHART IN EPIC  Family History reviewed for pertinent findings.  Review of Systems  Constitutional: Positive for fatigue and fever. Negative for appetite change.  HENT: Positive for congestion, sinus pressure and sore throat.   Eyes: Negative.  Negative for pain and visual disturbance.  Respiratory: Positive for cough and  wheezing. Negative for chest tightness and shortness of breath.   Cardiovascular: Negative.  Negative for chest pain, palpitations and leg swelling.  Gastrointestinal: Negative.  Negative for abdominal pain, diarrhea, nausea and vomiting.  Endocrine: Negative.   Genitourinary: Negative.   Musculoskeletal: Positive for back pain and myalgias.  Skin: Negative.  Negative for color change and rash.  Neurological: Positive for headaches. Negative for weakness and numbness.  Psychiatric/Behavioral: Negative.     Allergies as of 11/22/2017      Reactions   Codeine Sulfate Nausea And Vomiting      Medication List        Accurate as of 11/22/17  5:41 PM. Always use your most recent med list.          albuterol 108 (90 Base) MCG/ACT inhaler Commonly known as:  PROVENTIL HFA;VENTOLIN HFA Inhale 2 puffs into the lungs every 6 (six) hours as needed for wheezing or shortness of breath.   cefdinir 300 MG capsule Commonly known as:  OMNICEF Take 1 capsule (300 mg total) by mouth 2 (two) times daily. 1 po BID   diclofenac 75 MG EC tablet Commonly known as:  VOLTAREN TAKE 1 TABLET BY MOUTH TWICE DAILY FOR ARTHRITIS   docusate sodium 100 MG capsule Commonly known as:  COLACE Take 1 capsule (100 mg total) by mouth 2 (two) times daily.   lisinopril-hydrochlorothiazide  20-12.5 MG tablet Commonly known as:  PRINZIDE,ZESTORETIC TAKE 2 TABLETS BY MOUTH ONCE DAILY   meclizine 25 MG tablet Commonly known as:  ANTIVERT take 1  Tablet by mouth 3 times daily AS NEEDED FOR dizziness   OXYGEN Place 2.5 L/min into the nose at bedtime.   predniSONE 10 MG (48) Tbpk tablet Commonly known as:  STERAPRED UNI-PAK 48 TAB Take 12 day pack as directed          Objective:    BP 118/77   Pulse (!) 103   Temp 99 F (37.2 C) (Oral)   Ht 5\' 6"  (1.676 m)   Wt 234 lb 9.6 oz (106.4 kg)   SpO2 93%   BMI 37.87 kg/m   Allergies  Allergen Reactions  . Codeine Sulfate Nausea And Vomiting    Wt  Readings from Last 3 Encounters:  11/22/17 234 lb 9.6 oz (106.4 kg)  11/11/17 234 lb 12.8 oz (106.5 kg)  11/05/17 234 lb (106.1 kg)    Physical Exam  Constitutional: He appears well-developed and well-nourished.  HENT:  Head: Normocephalic and atraumatic.  Right Ear: Hearing and tympanic membrane normal.  Left Ear: Hearing and tympanic membrane normal.  Nose: Mucosal edema and sinus tenderness present. No nasal deformity. Right sinus exhibits frontal sinus tenderness. Left sinus exhibits frontal sinus tenderness.  Mouth/Throat: Posterior oropharyngeal erythema present.  Eyes: Pupils are equal, round, and reactive to light. Conjunctivae and EOM are normal. Right eye exhibits no discharge. Left eye exhibits no discharge.  Neck: Normal range of motion. Neck supple.  Cardiovascular: Normal rate, regular rhythm and normal heart sounds.  Pulmonary/Chest: Effort normal. No respiratory distress. He has no decreased breath sounds. He has wheezes. He has no rhonchi. He has no rales.  Abdominal: Soft. Bowel sounds are normal.  Musculoskeletal: Normal range of motion.  Skin: Skin is warm and dry.    Results for orders placed or performed in visit on 08/02/17  Urine Culture  Result Value Ref Range   Urine Culture, Routine Final report    Organism ID, Bacteria No growth   Microscopic Examination  Result Value Ref Range   WBC, UA 11-30 (A) 0 - 5 /hpf   RBC, UA 3-10 (A) 0 - 2 /hpf   Epithelial Cells (non renal) 0-10 0 - 10 /hpf   Renal Epithel, UA None seen None seen /hpf   Bacteria, UA None seen None seen/Few  Urinalysis, Complete  Result Value Ref Range   Specific Gravity, UA 1.015 1.005 - 1.030   pH, UA 7.5 5.0 - 7.5   Color, UA Yellow Yellow   Appearance Ur Clear Clear   Leukocytes, UA 1+ (A) Negative   Protein, UA Negative Negative/Trace   Glucose, UA Negative Negative   Ketones, UA Negative Negative   RBC, UA 2+ (A) Negative   Bilirubin, UA Negative Negative   Urobilinogen, Ur 0.2  0.2 - 1.0 mg/dL   Nitrite, UA Negative Negative   Microscopic Examination See below:       Assessment & Plan:   1. Bronchitis, acute, with bronchospasm - cefdinir (OMNICEF) 300 MG capsule; Take 1 capsule (300 mg total) by mouth 2 (two) times daily. 1 po BID  Dispense: 20 capsule; Refill: 0 - predniSONE (STERAPRED UNI-PAK 48 TAB) 10 MG (48) TBPK tablet; Take 12 day pack as directed  Dispense: 48 tablet; Refill: 0   Continue all other maintenance medications as listed above.  Follow up plan: No follow-ups on file.  Educational handout given  for [place] PA-C [place] [address]  [place], [place] [address] [phone]   11/22/2017, 5:41 PM

## 2017-12-14 DIAGNOSIS — C674 Malignant neoplasm of posterior wall of bladder: Secondary | ICD-10-CM | POA: Diagnosis not present

## 2017-12-14 DIAGNOSIS — N401 Enlarged prostate with lower urinary tract symptoms: Secondary | ICD-10-CM | POA: Diagnosis not present

## 2017-12-16 DIAGNOSIS — C674 Malignant neoplasm of posterior wall of bladder: Secondary | ICD-10-CM | POA: Diagnosis not present

## 2017-12-16 DIAGNOSIS — K8020 Calculus of gallbladder without cholecystitis without obstruction: Secondary | ICD-10-CM | POA: Diagnosis not present

## 2017-12-21 ENCOUNTER — Other Ambulatory Visit: Payer: Self-pay | Admitting: Urology

## 2017-12-27 ENCOUNTER — Encounter: Payer: Self-pay | Admitting: *Deleted

## 2017-12-27 ENCOUNTER — Telehealth: Payer: Self-pay | Admitting: *Deleted

## 2017-12-27 ENCOUNTER — Ambulatory Visit (INDEPENDENT_AMBULATORY_CARE_PROVIDER_SITE_OTHER): Payer: Medicare Other | Admitting: *Deleted

## 2017-12-27 ENCOUNTER — Other Ambulatory Visit: Payer: Self-pay | Admitting: Physician Assistant

## 2017-12-27 VITALS — BP 132/82 | HR 81 | Temp 97.0°F | Ht 66.0 in | Wt 236.0 lb

## 2017-12-27 DIAGNOSIS — Z0000 Encounter for general adult medical examination without abnormal findings: Secondary | ICD-10-CM | POA: Diagnosis not present

## 2017-12-27 MED ORDER — DOXYCYCLINE HYCLATE 100 MG PO TABS: 100.0000 mg | ORAL_TABLET | Freq: Two times a day (BID) | ORAL | 0 refills | Status: DC
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
## 2017-12-27 NOTE — Patient Instructions (Addendum)
  Billy Rasmussen , Thank you for taking time to come for your Medicare Wellness Visit. I appreciate your ongoing commitment to your health goals. Please review the following plan we discussed and let me know if I can assist you in the future.   These are the goals we discussed: Goals    . Prevent falls     Stay active  Cont. To do the lords work        This is a list of the screening recommended for you and due dates:  Health Maintenance  Topic Date Due  . Tetanus Vaccine  04/08/2018*  . Flu Shot  12/30/2017  . Colon Cancer Screening  01/23/2018  . Pneumonia vaccines (2 of 2 - PPSV23) 12/28/2018  .  Hepatitis C: One time screening is recommended by Center for Disease Control  (CDC) for  adults born from [birth year] through 1963.   Completed  *Topic was postponed. The date shown is not the original due date.   Keep follow up with Particia Nearing and other specialist

## 2017-12-27 NOTE — Telephone Encounter (Signed)
Pt aware.

## 2017-12-27 NOTE — Telephone Encounter (Signed)
absolutely, I will send the prescription in.

## 2018-01-05 ENCOUNTER — Other Ambulatory Visit: Payer: Self-pay | Admitting: *Deleted

## 2018-01-05 ENCOUNTER — Encounter (HOSPITAL_BASED_OUTPATIENT_CLINIC_OR_DEPARTMENT_OTHER): Payer: Self-pay | Admitting: *Deleted

## 2018-01-05 MED ORDER — LISINOPRIL-HYDROCHLOROTHIAZIDE 20-12.5 MG PO TABS: 2.0000 | ORAL_TABLET | Freq: Every day | ORAL | 0 refills | Status: DC

## 2018-01-05 MED ORDER — MECLIZINE HCL 25 MG PO TABS: ORAL_TABLET | ORAL | 1 refills | Status: DC

## 2018-01-05 MED ORDER — DICLOFENAC SODIUM 75 MG PO TBEC: 75.0000 mg | DELAYED_RELEASE_TABLET | Freq: Two times a day (BID) | ORAL | 0 refills | Status: DC

## 2018-01-06 ENCOUNTER — Other Ambulatory Visit: Payer: Self-pay

## 2018-01-06 ENCOUNTER — Encounter (HOSPITAL_BASED_OUTPATIENT_CLINIC_OR_DEPARTMENT_OTHER): Payer: Self-pay | Admitting: *Deleted

## 2018-01-06 NOTE — Progress Notes (Signed)
Npo after midnight, arrive 530 am 01-18-18 [facility] No meds to take  has surgery orders in epic Wife peggy driver cell [phone] will stay for procedure per patient Needs I stat 4 and ekg

## 2018-01-06 NOTE — Progress Notes (Signed)
Spoke with chelsey woodrum mda and made aware patient uses supplemental oxygen 2.5 liters in place of cpap at hs and old 2014 and 2016 ekg results. Patient ok for surgery at [facility] per chelsey woodrum mda.

## 2018-01-18 ENCOUNTER — Ambulatory Visit (HOSPITAL_BASED_OUTPATIENT_CLINIC_OR_DEPARTMENT_OTHER)
Admission: RE | Admit: 2018-01-18 | Discharge: 2018-01-18 | Disposition: A | Discharge status: Home / Self Care | Payer: Medicare Other | Source: Ambulatory Visit | Attending: Urology | Admitting: Urology

## 2018-01-18 ENCOUNTER — Ambulatory Visit (HOSPITAL_BASED_OUTPATIENT_CLINIC_OR_DEPARTMENT_OTHER): Payer: Medicare Other | Admitting: Anesthesiology

## 2018-01-18 ENCOUNTER — Encounter (HOSPITAL_BASED_OUTPATIENT_CLINIC_OR_DEPARTMENT_OTHER): Payer: Self-pay

## 2018-01-18 ENCOUNTER — Encounter (HOSPITAL_BASED_OUTPATIENT_CLINIC_OR_DEPARTMENT_OTHER)
Admission: RE | Disposition: A | Discharge status: Home / Self Care | Payer: Self-pay | Source: Ambulatory Visit | Attending: Urology

## 2018-01-18 DIAGNOSIS — M19012 Primary osteoarthritis, left shoulder: Secondary | ICD-10-CM | POA: Diagnosis not present

## 2018-01-18 DIAGNOSIS — L409 Psoriasis, unspecified: Secondary | ICD-10-CM | POA: Diagnosis not present

## 2018-01-18 DIAGNOSIS — N401 Enlarged prostate with lower urinary tract symptoms: Secondary | ICD-10-CM | POA: Insufficient documentation

## 2018-01-18 DIAGNOSIS — H9313 Tinnitus, bilateral: Secondary | ICD-10-CM | POA: Insufficient documentation

## 2018-01-18 DIAGNOSIS — R3911 Hesitancy of micturition: Secondary | ICD-10-CM | POA: Insufficient documentation

## 2018-01-18 DIAGNOSIS — M19011 Primary osteoarthritis, right shoulder: Secondary | ICD-10-CM | POA: Diagnosis not present

## 2018-01-18 DIAGNOSIS — D090 Carcinoma in situ of bladder: Secondary | ICD-10-CM | POA: Diagnosis not present

## 2018-01-18 DIAGNOSIS — N3289 Other specified disorders of bladder: Secondary | ICD-10-CM

## 2018-01-18 DIAGNOSIS — M19022 Primary osteoarthritis, left elbow: Secondary | ICD-10-CM | POA: Insufficient documentation

## 2018-01-18 DIAGNOSIS — Z96653 Presence of artificial knee joint, bilateral: Secondary | ICD-10-CM | POA: Diagnosis not present

## 2018-01-18 DIAGNOSIS — Z8551 Personal history of malignant neoplasm of bladder: Secondary | ICD-10-CM

## 2018-01-18 DIAGNOSIS — Z96611 Presence of right artificial shoulder joint: Secondary | ICD-10-CM | POA: Insufficient documentation

## 2018-01-18 DIAGNOSIS — Z820 Family history of epilepsy and other diseases of the nervous system: Secondary | ICD-10-CM | POA: Insufficient documentation

## 2018-01-18 DIAGNOSIS — I10 Essential (primary) hypertension: Secondary | ICD-10-CM | POA: Diagnosis not present

## 2018-01-18 DIAGNOSIS — M170 Bilateral primary osteoarthritis of knee: Secondary | ICD-10-CM | POA: Diagnosis not present

## 2018-01-18 DIAGNOSIS — G4733 Obstructive sleep apnea (adult) (pediatric): Secondary | ICD-10-CM | POA: Insufficient documentation

## 2018-01-18 DIAGNOSIS — Z7951 Long term (current) use of inhaled steroids: Secondary | ICD-10-CM | POA: Insufficient documentation

## 2018-01-18 DIAGNOSIS — Z833 Family history of diabetes mellitus: Secondary | ICD-10-CM | POA: Insufficient documentation

## 2018-01-18 DIAGNOSIS — E669 Obesity, unspecified: Secondary | ICD-10-CM | POA: Insufficient documentation

## 2018-01-18 DIAGNOSIS — K219 Gastro-esophageal reflux disease without esophagitis: Secondary | ICD-10-CM | POA: Diagnosis not present

## 2018-01-18 DIAGNOSIS — M797 Fibromyalgia: Secondary | ICD-10-CM | POA: Diagnosis not present

## 2018-01-18 DIAGNOSIS — N3010 Interstitial cystitis (chronic) without hematuria: Secondary | ICD-10-CM | POA: Diagnosis not present

## 2018-01-18 DIAGNOSIS — K5730 Diverticulosis of large intestine without perforation or abscess without bleeding: Secondary | ICD-10-CM | POA: Insufficient documentation

## 2018-01-18 DIAGNOSIS — Z8711 Personal history of peptic ulcer disease: Secondary | ICD-10-CM | POA: Insufficient documentation

## 2018-01-18 DIAGNOSIS — Z9049 Acquired absence of other specified parts of digestive tract: Secondary | ICD-10-CM | POA: Diagnosis not present

## 2018-01-18 DIAGNOSIS — Z96612 Presence of left artificial shoulder joint: Secondary | ICD-10-CM | POA: Diagnosis not present

## 2018-01-18 DIAGNOSIS — N138 Other obstructive and reflux uropathy: Secondary | ICD-10-CM

## 2018-01-18 DIAGNOSIS — Z8249 Family history of ischemic heart disease and other diseases of the circulatory system: Secondary | ICD-10-CM | POA: Insufficient documentation

## 2018-01-18 DIAGNOSIS — M19021 Primary osteoarthritis, right elbow: Secondary | ICD-10-CM | POA: Diagnosis not present

## 2018-01-18 DIAGNOSIS — I447 Left bundle-branch block, unspecified: Secondary | ICD-10-CM | POA: Insufficient documentation

## 2018-01-18 DIAGNOSIS — R3912 Poor urinary stream: Secondary | ICD-10-CM | POA: Insufficient documentation

## 2018-01-18 DIAGNOSIS — Z87891 Personal history of nicotine dependence: Secondary | ICD-10-CM | POA: Insufficient documentation

## 2018-01-18 DIAGNOSIS — Z9981 Dependence on supplemental oxygen: Secondary | ICD-10-CM | POA: Insufficient documentation

## 2018-01-18 DIAGNOSIS — Z6836 Body mass index (BMI) 36.0-36.9, adult: Secondary | ICD-10-CM | POA: Insufficient documentation

## 2018-01-18 HISTORY — DX: Gastro-esophageal reflux disease without esophagitis: K21.9

## 2018-01-18 HISTORY — DX: Psoriasis, unspecified: L40.9

## 2018-01-18 HISTORY — PX: CYSTOSCOPY WITH INSERTION OF UROLIFT: SHX6678

## 2018-01-18 HISTORY — PX: CYSTOSCOPY WITH FULGERATION: SHX6638

## 2018-01-18 LAB — POCT I-STAT 4, (NA,K, GLUC, HGB,HCT)
Glucose, Bld: 101 mg/dL — ABNORMAL HIGH (ref 70–99)
HCT: 42 % (ref 39.0–52.0)
Hemoglobin: 14.3 g/dL (ref 13.0–17.0)
Potassium: 3.7 mmol/L (ref 3.5–5.1)
SODIUM: 141 mmol/L (ref 135–145)

## 2018-01-18 SURGERY — CYSTOSCOPY, WITH BLADDER FULGURATION
Anesthesia: General

## 2018-01-18 MED ORDER — OXYCODONE HCL 5 MG PO TABS
ORAL_TABLET | ORAL | Status: AC
  Filled 2018-01-18: qty 1

## 2018-01-18 MED ORDER — MIDAZOLAM HCL 2 MG/2ML IJ SOLN
INTRAMUSCULAR | Status: AC
  Filled 2018-01-18: qty 2

## 2018-01-18 MED ORDER — PROPOFOL 10 MG/ML IV BOLUS
INTRAVENOUS | Status: DC | PRN
  Administered 2018-01-18: 200 mg via INTRAVENOUS

## 2018-01-18 MED ORDER — BELLADONNA ALKALOIDS-OPIUM 16.2-60 MG RE SUPP
RECTAL | Status: DC | PRN
  Administered 2018-01-18: 1 via RECTAL

## 2018-01-18 MED ORDER — KETOROLAC TROMETHAMINE 30 MG/ML IJ SOLN
INTRAMUSCULAR | Status: DC | PRN
  Administered 2018-01-18: 30 mg via INTRAVENOUS

## 2018-01-18 MED ORDER — DEXAMETHASONE SODIUM PHOSPHATE 4 MG/ML IJ SOLN
INTRAMUSCULAR | Status: DC | PRN
  Administered 2018-01-18: 4 mg via INTRAVENOUS

## 2018-01-18 MED ORDER — FENTANYL CITRATE (PF) 100 MCG/2ML IJ SOLN
INTRAMUSCULAR | Status: AC
  Filled 2018-01-18: qty 2

## 2018-01-18 MED ORDER — CEFAZOLIN SODIUM-DEXTROSE 2-4 GM/100ML-% IV SOLN
2.0000 g | Freq: Once | INTRAVENOUS | Status: AC
  Administered 2018-01-18: 2 g via INTRAVENOUS
  Filled 2018-01-18: qty 100

## 2018-01-18 MED ORDER — LIDOCAINE HCL (CARDIAC) PF 100 MG/5ML IV SOSY
PREFILLED_SYRINGE | INTRAVENOUS | Status: DC | PRN
  Administered 2018-01-18: 50 mg via INTRAVENOUS

## 2018-01-18 MED ORDER — PROPOFOL 10 MG/ML IV BOLUS
INTRAVENOUS | Status: AC
  Filled 2018-01-18: qty 20

## 2018-01-18 MED ORDER — ONDANSETRON HCL 4 MG/2ML IJ SOLN
INTRAMUSCULAR | Status: DC | PRN
  Administered 2018-01-18: 4 mg via INTRAVENOUS

## 2018-01-18 MED ORDER — FENTANYL CITRATE (PF) 100 MCG/2ML IJ SOLN
INTRAMUSCULAR | Status: DC | PRN
  Administered 2018-01-18 (×2): 50 ug via INTRAVENOUS

## 2018-01-18 MED ORDER — KETOROLAC TROMETHAMINE 30 MG/ML IJ SOLN
INTRAMUSCULAR | Status: AC
  Filled 2018-01-18: qty 1

## 2018-01-18 MED ORDER — LIDOCAINE 2% (20 MG/ML) 5 ML SYRINGE
INTRAMUSCULAR | Status: AC
  Filled 2018-01-18: qty 5

## 2018-01-18 MED ORDER — FENTANYL CITRATE (PF) 100 MCG/2ML IJ SOLN
25.0000 ug | INTRAMUSCULAR | Status: DC | PRN
  Administered 2018-01-18: 50 ug via INTRAVENOUS
  Filled 2018-01-18: qty 1

## 2018-01-18 MED ORDER — BELLADONNA ALKALOIDS-OPIUM 16.2-60 MG RE SUPP
RECTAL | Status: AC
  Filled 2018-01-18: qty 1

## 2018-01-18 MED ORDER — ONDANSETRON HCL 4 MG/2ML IJ SOLN
INTRAMUSCULAR | Status: AC
  Filled 2018-01-18: qty 2

## 2018-01-18 MED ORDER — STERILE WATER FOR IRRIGATION IR SOLN
Status: DC | PRN
  Administered 2018-01-18: 3000 mL via INTRAVESICAL

## 2018-01-18 MED ORDER — CEFAZOLIN SODIUM-DEXTROSE 2-4 GM/100ML-% IV SOLN
INTRAVENOUS | Status: AC
  Filled 2018-01-18: qty 100

## 2018-01-18 MED ORDER — MIDAZOLAM HCL 5 MG/5ML IJ SOLN
INTRAMUSCULAR | Status: DC | PRN
  Administered 2018-01-18: 2 mg via INTRAVENOUS

## 2018-01-18 MED ORDER — OXYCODONE HCL 5 MG/5ML PO SOLN
5.0000 mg | Freq: Once | ORAL | Status: AC | PRN
  Filled 2018-01-18: qty 5

## 2018-01-18 MED ORDER — LACTATED RINGERS IV SOLN
INTRAVENOUS | Status: DC
  Administered 2018-01-18 (×3): via INTRAVENOUS
  Filled 2018-01-18: qty 1000

## 2018-01-18 MED ORDER — OXYCODONE HCL 5 MG PO TABS
5.0000 mg | ORAL_TABLET | Freq: Once | ORAL | Status: AC | PRN
  Administered 2018-01-18: 5 mg via ORAL
  Filled 2018-01-18: qty 1

## 2018-01-18 MED ORDER — ONDANSETRON HCL 4 MG/2ML IJ SOLN
4.0000 mg | Freq: Four times a day (QID) | INTRAMUSCULAR | Status: DC | PRN
  Filled 2018-01-18: qty 2

## 2018-01-18 SURGICAL SUPPLY — 21 items
BAG DRAIN URO-CYSTO SKYTR STRL (DRAIN) ×2 IMPLANT
BAG URINE DRAINAGE (UROLOGICAL SUPPLIES) ×2 IMPLANT
BAG URINE LEG 19OZ MD ST LTX (BAG) IMPLANT
BAG URINE LEG 500ML (DRAIN) IMPLANT
CATH COUDE FOLEY 2W 5CC 18FR (CATHETERS) ×2 IMPLANT
CATH FOLEY 2WAY SLVR  5CC 16FR (CATHETERS)
CATH FOLEY 2WAY SLVR 5CC 16FR (CATHETERS) IMPLANT
CLOTH BEACON ORANGE TIMEOUT ST (SAFETY) ×2 IMPLANT
ELECT REM PT RETURN 9FT ADLT (ELECTROSURGICAL) ×2
ELECTRODE REM PT RTRN 9FT ADLT (ELECTROSURGICAL) ×1 IMPLANT
GLOVE BIO SURGEON STRL SZ7.5 (GLOVE) ×8 IMPLANT
GOWN STRL REUS W/ TWL XL LVL3 (GOWN DISPOSABLE) ×1 IMPLANT
GOWN STRL REUS W/TWL XL LVL3 (GOWN DISPOSABLE) ×1
KIT TURNOVER CYSTO (KITS) ×2 IMPLANT
MANIFOLD NEPTUNE II (INSTRUMENTS) ×2 IMPLANT
NEEDLE HYPO 22GX1.5 SAFETY (NEEDLE) IMPLANT
NS IRRIG 500ML POUR BTL (IV SOLUTION) IMPLANT
PACK CYSTO (CUSTOM PROCEDURE TRAY) ×2 IMPLANT
SYSTEM UROLIFT (Male Continence) ×8 IMPLANT
TUBE CONNECTING 12X1/4 (SUCTIONS) IMPLANT
WATER STERILE IRR 3000ML UROMA (IV SOLUTION) ×4 IMPLANT

## 2018-01-18 NOTE — Op Note (Signed)
Preoperative diagnosis: BPH, lower urinary tract symptoms/hesitancy, bladder erythema and edema, history of bladder cancer  Postoperative diagnosis: Same  Procedure: Exam under anesthesia, cystoscopy with bladder biopsy and fulguration, prostatic urethral lift (UroLift) x 4   Surgeon: Junious Silk  Anesthesia: General  Indication for procedure: 72 year old male with history of high-grade TA and CIS of the bladder with recent bladder erythema noted on office cystoscopy and thickening of the bladder on CT scan.  No specific mass.  He also had obstructive voiding symptoms and visual obstruction from lateral lobe BPH.  Findings: On exam under anesthesia the penis was circumcised without mass or lesion.  Testicles were descended bilaterally and palpably normal.  On DRE the prostate was about 30 g and smooth without hard area or nodule.  No abdominal or bladder masses noted on bimanual.  On cystoscopy there was lateral lobe hypertrophy with visual obstruction.  There were no stone or foreign body in the bladder.  The trigone and ureteral orifice ease appeared normal with clear efflux.  There was no papillary lesions.  There were stellate areas of ulceration, erythema and edema mainly left superior and left anterior.  With the slightest bit of bladder distention these areas bled and appeared similar to Hunter's ulcers and IC patients. There was no specific area for transurethral resection and the patient was a belly breather which would have made that very difficult.  Therefore the decision was made to use cold cup and Bugbee fulguration.  Description of procedure: After consent was obtained patient brought the operating room.  After adequate anesthesia placed in lithotomy position and prepped and draped in the usual sterile fashion.  A timeout was performed to confirm the patient and procedure.  An exam under anesthesia was performed.  The cystoscope was passed per urethra and the bladder carefully inspected  with a 30 degree and 70 degree lens.  I then biopsied the left anterior with the cold cup x3.  The entire area was fulgurated -- about 2.5 cm.  This required suprapubic pressure from myself and an assistant and was a difficult area to reach.  A similar area was biopsied left posterior inferior and left posterior superior. These areas were about 2 cm each. The edematous and ulcerated areas were fulgurated with excellent hemostasis.  Attention was then turned to the prostate and the prostatic urethra was again inspected and noted to be obstructed from lateral lobe hypertrophy. A 39F cystoscope was inserted into the bladder. The cystoscopy bridge was replaced with a UroLift delivery device.The first treatment site was the patient's left side approximately 1.5cm distal to the bladder neck. The distal tip of the delivery device was then angled laterally approximately 20 degrees at this position to compress the lateral lobe. The trigger was pulled, thereby deploying a needle containing the implant through the prostate. The needle was then retracted, allowing one end of the implant to be delivered to the capsular surface of the prostate. The implant was then tensioned to assure capsular seating and removal of slack monofilament. The device was then angled back toward midline and slowly advanced proximally until cystoscopic verification of the monofilament being centered in the delivery bay. The urethral end piece was then affixed to the monofilament thereby tailoring the size of the implant. Excess filament was then severed. The delivery device was then re-advanced into the bladder. The delivery device was then replaced with cystoscope and bridge and the implant location and opening effect was confirmed cystoscopically. The same procedure was then repeated on the  right side 1.5 cm into the prostate urethra from the right bladder neck.  This created a good proximal channel but left obstructing apical tissue. Two  additional implants were delivered just proximal to the verumontanum, again one on left and the right side of the prostate, following the same technique. The delivery device was again replaced with the cystoscope in the bridge.  There was an excellent channel from the veru up through the bladder neck.  Bladder neck inspection noted no urethral end pieces protruded into the bladder.  The scope was removed and an 30 [place] coud catheter was placed in left to gravity drainage with clear drainage and clear irrigation.  I placed a B&O suppository.  He was awakened and taken to the recovery room in stable condition.  Complications: None  Blood loss: Minimal  Specimens to pathology: #1 left anterior bladder biopsy #2 left inferior bladder biopsy #3 left superior bladder biopsy  Drains: 18 French coud catheter  Disposition: Patient stable to PACU

## 2018-01-18 NOTE — Discharge Instructions (Signed)
Indwelling Urinary Catheter Care, Adult Take good care of your catheter to keep it working and to prevent problems. How to wear your catheter Attach your catheter to your leg with tape (adhesive tape) or a leg strap. Make sure it is not too tight. If you use tape, remove any bits of tape that are already on the catheter. How to wear a drainage bag You should have:  A large overnight bag.  A small leg bag.  Overnight Bag You may wear the overnight bag at any time. Always keep the bag below the level of your bladder but off the floor. When you sleep, put a clean plastic bag in a wastebasket. Then hang the bag inside the wastebasket. Leg Bag Never wear the leg bag at night. Always wear the leg bag below your knee. Keep the leg bag secure with a leg strap or tape. How to care for your skin  Clean the skin around the catheter at least once every day.  Shower every day. Do not take baths.  Put creams, lotions, or ointments on your genital area only as told by your doctor.  Do not use powders, sprays, or lotions on your genital area. How to clean your catheter and your skin 1. Wash your hands with soap and water. 2. Wet a washcloth in warm water and gentle (mild) soap. 3. Use the washcloth to clean the skin where the catheter enters your body. Clean downward and wipe away from the catheter in small circles. Do not wipe toward the catheter. 4. Pat the area dry with a clean towel. Make sure to clean off all soap. How to care for your drainage bags Empty your drainage bag when it is ?- full or at least 2-3 times a day. Replace your drainage bag once a month or sooner if it starts to smell bad or look dirty. Do not clean your drainage bag unless told by your doctor. Emptying a drainage bag  Supplies Needed  Rubbing alcohol.  Gauze pad or cotton ball.  Tape or a leg strap.  Steps 1. Wash your hands with soap and water. 2. Separate (detach) the bag from your leg. 3. Hold the bag over  the toilet or a clean container. Keep the bag below your hips and bladder. This stops pee (urine) from going back into the tube. 4. Open the pour spout at the bottom of the bag. 5. Empty the pee into the toilet or container. Do not let the pour spout touch any surface. 6. Put rubbing alcohol on a gauze pad or cotton ball. 7. Use the gauze pad or cotton ball to clean the pour spout. 8. Close the pour spout. 9. Attach the bag to your leg with tape or a leg strap. 10. Wash your hands.  Changing a drainage bag Supplies Needed  Alcohol wipes.  A clean drainage bag.  Adhesive tape or a leg strap.  Steps 1. Wash your hands with soap and water. 2. Separate the dirty bag from your leg. 3. Pinch the rubber catheter with your fingers so that pee does not spill out. 4. Separate the catheter tube from the drainage tube where these tubes connect (at the connection valve). Do not let the tubes touch any surface. 5. Clean the end of the catheter tube with an alcohol wipe. Use a different alcohol wipe to clean the end of the drainage tube. 6. Connect the catheter tube to the drainage tube of the clean bag. 7. Attach the new bag to  the leg with adhesive tape or a leg strap. 8. Wash your hands.  How to prevent infection and other problems  Never pull on your catheter or try to remove it. Pulling can damage tissue in your body.  Always wash your hands before and after touching your catheter.  If a leg strap gets wet, replace it with a dry one.  Drink enough fluids to keep your pee clear or pale yellow, or as told by your doctor.  Do not let the drainage bag or tubing touch the floor.  Wear cotton underwear.  If you are male, wipe from front to back after you poop (have a bowel movement).  Check on the catheter often to make sure it works and the tubing is not twisted. Get help if:  Your pee is cloudy.  Your pee smells unusually bad.  Your pee is not draining into the bag.  Your  tube gets clogged.  Your catheter starts to leak.  Your bladder feels full. Get help right away if:  You have redness, swelling, or pain where the catheter enters your body.  You have fluid, pus, or a bad smell coming from the area where the catheter enters your body.  The area where the catheter enters your body feels warm.  You have a fever.  You have pain in your: ? Stomach (abdomen). ? Legs. ? Lower back. ? Bladder.  You see blood fill the catheter.  Your pee is pink or red.  You feel sick to your stomach (nauseous).  You throw up (vomit).  You have chills.  Your catheter gets pulled out.   Motrin,  Aleve after 2;30pm  needed   Cystoscopy, Care After Refer to this sheet in the next few weeks. These instructions provide you with information about caring for yourself after your procedure. Your health care provider may also give you more specific instructions. Your treatment has been planned according to current medical practices, but problems sometimes occur. Call your health care provider if you have any problems or questions after your procedure. What can I expect after the procedure? After the procedure, it is common to have:  Mild pain when you urinate. Pain should stop within a few minutes after you urinate. This may last for up to 1 week.  A small amount of blood in your urine for several days.  Feeling like you need to urinate but producing only a small amount of urine.  Follow these instructions at home:  Medicines  Take over-the-counter and prescription medicines only as told by your health care provider.  If you were prescribed an antibiotic medicine, take it as told by your health care provider. Do not stop taking the antibiotic even if you start to feel better. General instructions   Return to your normal activities as told by your health care provider. Ask your health care provider what activities are safe for you.  Do not drive for 24 hours  if you received a sedative.  Watch for any blood in your urine. If the amount of blood in your urine increases, call your health care provider.  Follow instructions from your health care provider about eating or drinking restrictions.  If a tissue sample was removed for testing (biopsy) during your procedure, it is your responsibility to get your test results. Ask your health care provider or the department performing the test when your results will be ready.  Drink enough fluid to keep your urine clear or pale yellow.  Keep all  follow-up visits as told by your health care provider. This is important. Contact a health care provider if:  You have pain that gets worse or does not get better with medicine, especially pain when you urinate.  You have difficulty urinating. Get help right away if:  You have more blood in your urine.  You have blood clots in your urine.  You have abdominal pain.  You have a fever or chills.  You are unable to urinate. This information is not intended to replace advice given to you by your health care provider. Make sure you discuss any questions you have with your health care provider. Document Released: 12/05/2004 Document Revised: 10/24/2015 Document Reviewed: 04/04/2015 Elsevier Interactive Patient Education  2018 [employer].  Prostatic Urethral Lift, Care After This sheet gives you information about how to care for yourself after your procedure. Your health care provider may also give you more specific instructions. If you have problems or questions, contact your health care provider. What can I expect after the procedure? After the procedure, it is common to have:  Discomfort or burning when urinating.  An increased urge to urinate.  More frequent urination.  Urine that is slightly blood-tinged.  These symptoms should go away after a few days. Follow these instructions at home:  Take over-the-counter and prescription medicines only as  told by your health care provider.  Do not drive for 24 hours if you were given a medicine to help you relax (sedative).  Do not drive or use heavy machinery while taking prescription pain medicine.  Do not lift anything that is heavier than 10 lb (4.5 kg) until your health care provider says that this is safe.  Return to your normal activities as told by your health care provider. Ask your health care provider what activities are safe for you. Ask when you can return to sexual activity.  Drink enough fluid to keep your urine clear or pale yellow.  Keep all follow-up visits as told by your health care provider. This is important. Contact a health care provider if:  You have chills or a fever.  You have pain when passing urine.  You have bright red blood or blood clots in your urine.  You have difficulty passing urine.  You have leaking of urine (incontinence). Get help right away if:  You have chest pain or shortness of breath.  You have leg pain or swelling.  You cannot pass urine. Summary  After the procedure, it is common to have discomfort or burning when urinating, an increased urge to urinate, more frequent urination, and urine that is slightly blood-tinged.  Do not drive for 24 hours if you were given a medicine to help you relax (sedative). Do not drive or use heavy machinery while taking prescription pain medicine.  Do not lift anything that is heavier than 10 lb (4.5 kg) until your health care provider says that this is safe.  Return to your normal activities as told by your health care provider. This information is not intended to replace advice given to you by your health care provider. Make sure you discuss any questions you have with your health care provider. Document Released: 07/07/2016 Document Revised: 07/07/2016 Document Reviewed: 07/07/2016 Elsevier Interactive Patient Education  2018 [facility]  Instructions  Activity: Get plenty of rest for the remainder of the day. A responsible individual must stay with you for 24 hours following the procedure.  For the next 24 hours, DO  NOT: -Drive a car Film/video editor -Drink alcoholic beverages -Take any medication unless instructed by your physician -Make any legal decisions or sign important papers.  Meals: Start with liquid foods such as gelatin or soup. Progress to regular foods as tolerated. Avoid greasy, spicy, heavy foods. If nausea and/or vomiting occur, drink only clear liquids until the nausea and/or vomiting subsides. Call your physician if vomiting continues.  Special Instructions/Symptoms: Your throat may feel dry or sore from the anesthesia or the breathing tube placed in your throat during surgery. If this causes discomfort, gargle with warm salt water. The discomfort should disappear within 24 hours.  If you had a scopolamine patch placed behind your ear for the management of post- operative nausea and/or vomiting:  1. The medication in the patch is effective for 72 hours, after which it should be removed.  Wrap patch in a tissue and discard in the trash. Wash hands thoroughly with soap and water. 2. You may remove the patch earlier than 72 hours if you experience unpleasant side effects which may include dry mouth, dizziness or visual disturbances. 3. Avoid touching the patch. Wash your hands with soap and water after contact with the patch.
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
## 2018-01-18 NOTE — Transfer of Care (Signed)
Immediate Anesthesia Transfer of Care Note  Patient: Billy Rasmussen  Procedure(s) Performed: CYSTOSCOPY WITH FULGERATION/ BLADDER BIOPSY (N/A ) CYSTOSCOPY WITH INSERTION OF UROLIFT (N/A )  Patient Location: PACU  Anesthesia Type:General  Level of Consciousness: awake, alert  and oriented  Airway & Oxygen Therapy: Patient Spontanous Breathing and Patient connected to nasal cannula oxygen  Post-op Assessment: Report given to RN and Post -op Vital signs reviewed and stable  Post vital signs: Reviewed and stable  Last Vitals:  Vitals Value Taken Time  BP 165/83 01/18/2018  8:45 AM  Temp    Pulse 81 01/18/2018  8:47 AM  Resp 20 01/18/2018  8:47 AM  SpO2 93 % 01/18/2018  8:47 AM  Vitals shown include unvalidated device data.  Last Pain:  Vitals:   01/18/18 0558  TempSrc:   PainSc: 10-Worst pain ever      Patients Stated Pain Goal: 5 (41/42/39 5320)  Complications: No apparent anesthesia complications

## 2018-01-18 NOTE — Anesthesia Postprocedure Evaluation (Signed)
Anesthesia Post Note  Patient: EGYPT MARCHIANO  Procedure(s) Performed: CYSTOSCOPY WITH FULGERATION/ BLADDER BIOPSY (N/A ) CYSTOSCOPY WITH INSERTION OF UROLIFT (N/A )     Patient location during evaluation: PACU Anesthesia Type: General Level of consciousness: awake and alert Pain management: pain level controlled Vital Signs Assessment: post-procedure vital signs reviewed and stable Respiratory status: spontaneous breathing, nonlabored ventilation, respiratory function stable and patient connected to nasal cannula oxygen Cardiovascular status: blood pressure returned to baseline and stable Postop Assessment: no apparent nausea or vomiting Anesthetic complications: no    Last Vitals:  Vitals:   01/18/18 0930 01/18/18 0945  BP: (!) 149/90 (!) 157/88  Pulse: 73 61  Resp: 16 17  Temp:    SpO2: 94%     Last Pain:  Vitals:   01/18/18 0930  TempSrc:   PainSc: [place]

## 2018-01-18 NOTE — Anesthesia Preprocedure Evaluation (Signed)
Anesthesia Evaluation  Patient identified by MRN, date of birth, ID band Patient awake    Reviewed: Allergy & Precautions, H&P , NPO status , Patient's Chart, lab work & pertinent test results  History of Anesthesia Complications (+) PONV and history of anesthetic complications  Airway Mallampati: II   Neck ROM: full    Dental   Pulmonary sleep apnea and Oxygen sleep apnea , former smoker,    breath sounds clear to auscultation       Cardiovascular hypertension,  Rhythm:regular Rate:Normal     Neuro/Psych  Neuromuscular disease    GI/Hepatic GERD  ,  Endo/Other  obese  Renal/GU      Musculoskeletal  (+) Arthritis , Fibromyalgia -  Abdominal   Peds  Hematology   Anesthesia Other Findings   Reproductive/Obstetrics                             Anesthesia Physical Anesthesia Plan  ASA: III  Anesthesia Plan: General   Post-op Pain Management:    Induction: Intravenous  PONV Risk Score and Plan: 3 and Ondansetron, Dexamethasone, Midazolam and Treatment may vary due to age or medical condition  Airway Management Planned: LMA  Additional Equipment:   Intra-op Plan:   Post-operative Plan:   Informed Consent: I have reviewed the patients History and Physical, chart, labs and discussed the procedure including the risks, benefits and alternatives for the proposed anesthesia with the patient or authorized representative who has indicated his/her understanding and acceptance.     Plan Discussed with: CRNA, Anesthesiologist and Surgeon  Anesthesia Plan Comments:         Anesthesia Quick Evaluation

## 2018-01-18 NOTE — Anesthesia Procedure Notes (Signed)
Procedure Name: LMA Insertion Date/Time: 01/18/2018 7:39 AM Performed by: Bufford Spikes, CRNA Pre-anesthesia Checklist: Patient identified, Emergency Drugs available, Suction available and Patient being monitored Patient Re-evaluated:Patient Re-evaluated prior to induction Oxygen Delivery Method: Circle system utilized Preoxygenation: Pre-oxygenation with 100% oxygen Induction Type: IV induction Ventilation: Mask ventilation without difficulty LMA: LMA inserted LMA Size: 5.0 Number of attempts: 1 Airway Equipment and Method: Bite block Placement Confirmation: positive ETCO2 Tube secured with: Tape Dental Injury: Teeth and Oropharynx as per pre-operative assessment

## 2018-01-19 ENCOUNTER — Encounter (HOSPITAL_BASED_OUTPATIENT_CLINIC_OR_DEPARTMENT_OTHER): Payer: Self-pay | Admitting: Urology

## 2018-01-21 DIAGNOSIS — C674 Malignant neoplasm of posterior wall of bladder: Secondary | ICD-10-CM | POA: Diagnosis not present

## 2018-01-25 ENCOUNTER — Other Ambulatory Visit: Payer: Self-pay | Admitting: Physician Assistant

## 2018-01-27 ENCOUNTER — Other Ambulatory Visit: Payer: Self-pay | Admitting: Physician Assistant

## 2018-01-27 ENCOUNTER — Ambulatory Visit (INDEPENDENT_AMBULATORY_CARE_PROVIDER_SITE_OTHER): Payer: Medicare Other | Admitting: Family

## 2018-01-27 ENCOUNTER — Encounter: Payer: Self-pay | Admitting: Family

## 2018-01-27 VITALS — BP 124/84 | HR 86 | Temp 97.6°F | Ht 66.0 in | Wt 233.2 lb

## 2018-01-27 DIAGNOSIS — N3001 Acute cystitis with hematuria: Secondary | ICD-10-CM

## 2018-01-27 DIAGNOSIS — R300 Dysuria: Secondary | ICD-10-CM

## 2018-01-27 LAB — URINALYSIS, COMPLETE
Bilirubin, UA: NEGATIVE
Glucose, UA: NEGATIVE
Ketones, UA: NEGATIVE
Nitrite, UA: POSITIVE — AB
Specific Gravity, UA: 1.02 (ref 1.005–1.030)
UUROB: 1 mg/dL (ref 0.2–1.0)
pH, UA: 6 (ref 5.0–7.5)

## 2018-01-27 LAB — MICROSCOPIC EXAMINATION
RBC, UA: >30 /hpf — AB (ref 0–2)
RENAL EPITHEL UA: NONE SEEN /HPF

## 2018-01-27 MED ORDER — CIPROFLOXACIN HCL 500 MG PO TABS: 500.0000 mg | ORAL_TABLET | Freq: Two times a day (BID) | ORAL | 0 refills | Status: DC

## 2018-01-27 NOTE — Progress Notes (Signed)
   Subjective:    Patient ID: Billy Rasmussen, male    DOB: 02-29-1946, 72 y.o.   MRN: [id number]  Chief Complaint  Patient presents with  . frequency and burning with voiding   PT presents to the office with UTI symptoms. He had a cystoscopy related to BPH with obstruction on 01/18/18 and had a stent placed. PT has follow up appt with Urologists 04/20/18.  Urinary Tract Infection   This is a chronic problem. The problem occurs every urination. The problem has been waxing and waning. The quality of the pain is described as burning. The pain is at a severity of 10/10. There has been no fever. Associated symptoms include frequency, hematuria and urgency. Pertinent negatives include no nausea or vomiting. He has tried increased fluids for the symptoms. The treatment provided mild relief.      Review of Systems  Gastrointestinal: Negative for nausea and vomiting.  Genitourinary: Positive for frequency, hematuria and urgency.  All other systems reviewed and are negative.      Objective:   Physical Exam  Constitutional: He is oriented to person, place, and time. He appears well-developed and well-nourished. No distress.  HENT:  Head: Normocephalic.  Eyes: Pupils are equal, round, and reactive to light. Right eye exhibits no discharge. Left eye exhibits no discharge.  Neck: Normal range of motion. Neck supple. No thyromegaly present.  Cardiovascular: Normal rate, regular rhythm, normal heart sounds and intact distal pulses.  No murmur heard. Pulmonary/Chest: Effort normal and breath sounds normal. No respiratory distress. He has no wheezes.  Abdominal: Soft. Bowel sounds are normal. He exhibits no distension. There is no tenderness.  Musculoskeletal: Normal range of motion. He exhibits no edema or tenderness.  Neurological: He is alert and oriented to person, place, and time. He has normal reflexes. No cranial nerve deficit.  Skin: Skin is warm and dry. No rash noted. No erythema.    Psychiatric: He has a normal mood and affect. His behavior is normal. Judgment and thought content normal.  Vitals reviewed.    BP 124/84   Pulse 86   Temp 97.6 F (36.4 C) (Oral)   Ht 5\' 6"  (1.676 m)   Wt 233 lb 3.2 oz (105.8 kg)   BMI 37.64 kg/m      Assessment & Plan:  Billy Rasmussen comes in today with chief complaint of frequency and burning with voiding   Diagnosis and orders addressed:  1. Dysuria - Urinalysis, Complete  2. Acute cystitis with hematuria Force fluids RTO prn Culture pending - ciprofloxacin (CIPRO) 500 MG tablet; Take 1 tablet (500 mg total) by mouth 2 (two) times daily.  Dispense: 14 tablet; Refill: 0    Evelina Dun, [name]

## 2018-01-27 NOTE — Patient Instructions (Signed)
Urinary Tract Infection, Adult °A urinary tract infection (UTI) is an infection of any part of the urinary tract. The urinary tract includes the: °· Kidneys. °· Ureters. °· Bladder. °· Urethra. ° °These organs make, store, and get rid of pee (urine) in the body. °Follow these instructions at home: °· Take over-the-counter and prescription medicines only as told by your doctor. °· If you were prescribed an antibiotic medicine, take it as told by your doctor. Do not stop taking the antibiotic even if you start to feel better. °· Avoid the following drinks: °? Alcohol. °? Caffeine. °? Tea. °? Carbonated drinks. °· Drink enough fluid to keep your pee clear or pale yellow. °· Keep all follow-up visits as told by your doctor. This is important. °· Make sure to: °? Empty your bladder often and completely. Do not to hold pee for long periods of time. °? Empty your bladder before and after sex. °? Wipe from front to back after a bowel movement if you are male. Use each tissue one time when you wipe. °Contact a doctor if: °· You have back pain. °· You have a fever. °· You feel sick to your stomach (nauseous). °· You throw up (vomit). °· Your symptoms do not get better after 3 days. °· Your symptoms go away and then come back. °Get help right away if: °· You have very bad back pain. °· You have very bad lower belly (abdominal) pain. °· You are throwing up and cannot keep down any medicines or water. °This information is not intended to replace advice given to you by your health care provider. Make sure you discuss any questions you have with your health care provider. °Document Released: 11/04/2007 Document Revised: 10/24/2015 Document Reviewed: 04/08/2015 °Elsevier Interactive Patient Education © 2018 [employer] Inc. ° °

## 2018-02-02 DIAGNOSIS — T50A95A Adverse effect of other bacterial vaccines, initial encounter: Principal | ICD-10-CM

## 2018-02-02 DIAGNOSIS — N3080 Other cystitis without hematuria: Secondary | ICD-10-CM | POA: Insufficient documentation

## 2018-02-03 ENCOUNTER — Encounter: Payer: Self-pay | Admitting: Internal Medicine

## 2018-02-03 ENCOUNTER — Ambulatory Visit (INDEPENDENT_AMBULATORY_CARE_PROVIDER_SITE_OTHER): Payer: Medicare Other | Admitting: Internal Medicine

## 2018-02-03 ENCOUNTER — Telehealth: Payer: Self-pay | Admitting: Physician Assistant

## 2018-02-03 DIAGNOSIS — N3080 Other cystitis without hematuria: Secondary | ICD-10-CM | POA: Diagnosis not present

## 2018-02-03 DIAGNOSIS — T50A95A Adverse effect of other bacterial vaccines, initial encounter: Secondary | ICD-10-CM | POA: Diagnosis not present

## 2018-02-03 MED ORDER — ISONIAZID 300 MG PO TABS: 300.0000 mg | ORAL_TABLET | Freq: Every day | ORAL | 0 refills | Status: DC

## 2018-02-03 NOTE — Telephone Encounter (Signed)
Patient aware that he will need labs at his next appointment.

## 2018-02-03 NOTE — Progress Notes (Signed)
[facility] for Infectious Disease  Reason for Consult: Possible BCG cystitis Referring Provider: Dr. Festus Aloe  Assessment: It would be unusual for BCG cystitis to persist for 1 year after his last treatment but I think it is certainly reasonable to obtain a culture for AFB and then start empiric isoniazid therapy.  We will treat him with 300 mg daily for 3 weeks and have him follow-up in 4 weeks.  Plan: 1. Urine for AFB stain and culture 2. Start isoniazid 300 mg daily for 3 weeks 3. Follow-up in 4 weeks  Patient Active Problem List   Diagnosis Date Noted  . BCG cystitis 02/02/2018    Priority: High  . Malignant neoplasm of urinary bladder ([place]) 10/02/2016    Priority: High  . Acute cystitis with hematuria 10/02/2016  . Pure hypercholesterolemia 10/02/2016  . Lipoma 09/30/2016  . Obese 03/31/2016  . S/P right TKA 03/30/2016  . S/P knee replacement 03/30/2016  . HTN (hypertension) 02/25/2016  . Healthcare maintenance 02/25/2016  . Obesity (BMI 30-39.9) 01/07/2016  . S/P shoulder replacement 07/20/2013  . Shoulder arthritis 04/07/2013  . CELLULITIS, KNEE, LEFT 08/26/2006    Patient's Medications  New Prescriptions   ISONIAZID (NYDRAZID) 300 MG TABLET    Take 1 tablet (300 mg total) by mouth daily.  Previous Medications   ALBUTEROL (PROVENTIL HFA;VENTOLIN HFA) 108 (90 BASE) MCG/ACT INHALER    Inhale 2 puffs into the lungs every 6 (six) hours as needed for wheezing or shortness of breath.   DICLOFENAC (VOLTAREN) 75 MG EC TABLET    Take 1 tablet (75 mg total) by mouth 2 (two) times daily.   DOCUSATE SODIUM (COLACE) 100 MG CAPSULE    Take 1 capsule (100 mg total) by mouth 2 (two) times daily.   LISINOPRIL-HYDROCHLOROTHIAZIDE (PRINZIDE,ZESTORETIC) 20-12.5 MG TABLET    TAKE 2 TABLETS BY MOUTH ONCE DAILY   MECLIZINE (ANTIVERT) 25 MG TABLET    take 1  Tablet by mouth 3 times daily AS NEEDED FOR dizziness   OXYGEN    Place 2.5 L/min into the nose at bedtime.    Modified Medications   No medications on file  Discontinued Medications   CIPROFLOXACIN (CIPRO) 500 MG TABLET    Take 1 tablet (500 mg total) by mouth 2 (two) times daily.    HPI: Billy Rasmussen is a 72 y.o. male who was diagnosed with bladder cancer in 2015.  He has been treated with multiple courses of BCG bladder installations.  He believes his last treatment was about 1 year ago.  He has had irritative bladder symptoms with most of his treatments but normally they improved shortly after the treatments.  Ever since he underwent cystoscopy in January of this year he has had worsening symptoms of dysuria, hesitancy, frequency, and hematuria.  He is also had recent anorexia and a 10 pound weight loss.  He has not had any fever, chills or sweats.  He underwent cystoscopy on 01/18/2018.  It revealed stellate areas of ulceration, erythema and edema on the left side of the bladder.  Biopsy showed sub-mucosa with chronic inflammation and urothelial carcinoma in situ.  The special stains or cultures for AFB were obtained.  He was recently seen by his primary care provider on 01/27/2018.  Urinalysis showed hematuria and pyuria.  I do not see that a urine culture was obtained.  He started ciprofloxacin and completed therapy this morning.  He says that he may have had slight  improvement in the hematuria and dysuria over the past 2 days.  Review of Systems: Review of Systems  Constitutional: Positive for malaise/fatigue and weight loss. Negative for chills, diaphoresis and fever.  Gastrointestinal: Positive for constipation. Negative for abdominal pain, diarrhea, nausea and vomiting.  Genitourinary: Positive for dysuria, frequency, hematuria and urgency. Negative for flank pain.  Skin: Negative for rash.      Past Medical History:  Diagnosis Date  . Arthritis    knees, shoulders, elbows  . Bladder cancer [facility])    urologist-  dr Junious Silk  . Diverticulosis of colon   . Fibromyalgia   . GERD  (gastroesophageal reflux disease)   . History of blood transfusion    age 38   . History of bronchitis   . History of diverticulitis of colon   . History of gastric ulcer    due to aleve  . Hypertension   . Incomplete left bundle branch block    told had irregular heartbeat 12-28-18-19, no referral to cardiology made  . Lower urinary tract symptoms (LUTS)   . OSA (obstructive sleep apnea)    NON-COMPLIANT  CPAP  --- BUT PT USES OXYGEN AT NIGHT 2.5L VIA [place] (PT'S DECISION)  . PONV (postoperative nausea and vomiting)   . Psoriasis    occ outbreaks on eyebrows chin and head, none recent  . Tinnitus    right ear more, has tranmitter in right ear removable at hs  . Urinary frequency   . Wears glasses   . Wears partial dentures     Social History   Tobacco Use  . Smoking status: Former Smoker    Packs/day: 2.00    Years: 40.00    Pack years: 80.00    Types: Cigarettes    Last attempt to quit: 06/01/1997    Years since quitting: 20.6  . Smokeless tobacco: Never Used  Substance Use Topics  . Alcohol use: No  . Drug use: No    Family History  Problem Relation Age of Onset  . Alzheimer's disease Mother   . Heart attack Father   . Heart disease Father   . Irregular heart beat Brother        DEFIB.  . Congestive Heart Failure Brother   . Diabetes Daughter    No Known Allergies  OBJECTIVE: Vitals:   02/03/18 1002  BP: 118/80  Pulse: 91  Temp: 98.2 F (36.8 C)  TempSrc: Oral  Weight: 222 lb (100.7 kg)  Height: 5\' 6"  (1.676 m)   Body mass index is 35.83 kg/m.   Physical Exam  Constitutional: He is oriented to person, place, and time.  He appears comfortable and in no distress.  He is in good spirits.  Abdominal: Soft. He exhibits no distension and no mass. There is tenderness. There is no guarding.  He has mild suprapubic tenderness with palpation.  He has no CVA tenderness.  Neurological: He is alert and oriented to person, place, and time.  Skin: No rash noted.    Psychiatric: He has a normal mood and affect.    Microbiology: Recent Results (from the past 240 hour(s))  Microscopic Examination     Status: Abnormal   Collection Time: 01/27/18  4:25 PM  Result Value Ref Range Status   WBC, UA >30 (A) 0 - 5 /hpf Final   RBC, UA >30 (A) 0 - 2 /hpf Final   Epithelial Cells (non renal) 0-10 0 - 10 /hpf Final   Renal Epithel, UA None seen None  seen /hpf Final   Bacteria, UA Many (A) None seen/Few Final    Michel Bickers, MD [facility] for Infectious [place] [phone] pager   [phone] cell 02/03/2018, 10:29 AM

## 2018-02-10 ENCOUNTER — Encounter: Payer: Self-pay | Admitting: Physician Assistant

## 2018-02-10 ENCOUNTER — Ambulatory Visit (INDEPENDENT_AMBULATORY_CARE_PROVIDER_SITE_OTHER): Payer: Medicare Other | Admitting: Physician Assistant

## 2018-02-10 VITALS — BP 123/76 | HR 71 | Temp 97.5°F | Ht 66.0 in | Wt 234.4 lb

## 2018-02-10 DIAGNOSIS — Z1211 Encounter for screening for malignant neoplasm of colon: Secondary | ICD-10-CM

## 2018-02-10 DIAGNOSIS — I10 Essential (primary) hypertension: Secondary | ICD-10-CM

## 2018-02-10 DIAGNOSIS — C679 Malignant neoplasm of bladder, unspecified: Secondary | ICD-10-CM | POA: Diagnosis not present

## 2018-02-10 DIAGNOSIS — E7800 Pure hypercholesterolemia, unspecified: Secondary | ICD-10-CM | POA: Diagnosis not present

## 2018-02-10 NOTE — Patient Instructions (Signed)
In a few days you may receive a survey in the mail or online from [employer] regarding your visit with us today. Please take a moment to fill this out. Your feedback is very important to our whole office. It can help us better understand your needs as well as improve your experience and satisfaction. Thank you for taking your time to complete it. We care about you.  Xavion Muscat, PA-C  

## 2018-02-11 LAB — CBC WITH DIFFERENTIAL/PLATELET
BASOS ABS: 0 10*3/uL (ref 0.0–0.2)
BASOS: 0 %
EOS (ABSOLUTE): 0.2 10*3/uL (ref 0.0–0.4)
EOS: 4 %
HEMATOCRIT: 43.3 % (ref 37.5–51.0)
Hemoglobin: 14.7 g/dL (ref 13.0–17.7)
IMMATURE GRANS (ABS): 0 10*3/uL (ref 0.0–0.1)
Immature Granulocytes: 1 %
LYMPHS ABS: 1.6 10*3/uL (ref 0.7–3.1)
Lymphs: 27 %
MCH: 30.7 pg (ref 26.6–33.0)
MCHC: 33.9 g/dL (ref 31.5–35.7)
MCV: 90 fL (ref 79–97)
MONOCYTES: 10 %
Monocytes Absolute: 0.6 10*3/uL (ref 0.1–0.9)
Neutrophils Absolute: 3.6 10*3/uL (ref 1.4–7.0)
Neutrophils: 58 %
Platelets: 162 10*3/uL (ref 150–450)
RBC: 4.79 x10E6/uL (ref 4.14–5.80)
RDW: 13.1 % (ref 12.3–15.4)
WBC: 6.1 10*3/uL (ref 3.4–10.8)

## 2018-02-11 LAB — LIPID PANEL
CHOL/HDL RATIO: 6.1 ratio — AB (ref 0.0–5.0)
CHOLESTEROL TOTAL: 152 mg/dL (ref 100–199)
HDL: 25 mg/dL — ABNORMAL LOW (ref >39)
LDL CALC: 91 mg/dL (ref 0–99)
Triglycerides: 179 mg/dL — ABNORMAL HIGH (ref 0–149)
VLDL CHOLESTEROL CAL: 36 mg/dL (ref 5–40)

## 2018-02-11 LAB — CMP14+EGFR
ALBUMIN: 4.5 g/dL (ref 3.5–4.8)
ALT: 29 IU/L (ref 0–44)
AST: 45 IU/L — ABNORMAL HIGH (ref 0–40)
Albumin/Globulin Ratio: 2 (ref 1.2–2.2)
Alkaline Phosphatase: 52 IU/L (ref 39–117)
BILIRUBIN TOTAL: 0.7 mg/dL (ref 0.0–1.2)
BUN / CREAT RATIO: 16 (ref 10–24)
BUN: 16 mg/dL (ref 8–27)
CALCIUM: 9.9 mg/dL (ref 8.6–10.2)
CHLORIDE: 98 mmol/L (ref 96–106)
CO2: 25 mmol/L (ref 20–29)
CREATININE: 1.03 mg/dL (ref 0.76–1.27)
GFR, EST AFRICAN AMERICAN: 85 mL/min/{1.73_m2} (ref >59)
GFR, EST NON AFRICAN AMERICAN: 73 mL/min/{1.73_m2} (ref >59)
GLUCOSE: 90 mg/dL (ref 65–99)
Globulin, Total: 2.3 g/dL (ref 1.5–4.5)
Potassium: 4.4 mmol/L (ref 3.5–5.2)
Sodium: 141 mmol/L (ref 134–144)
TOTAL PROTEIN: 6.8 g/dL (ref 6.0–8.5)

## 2018-02-14 NOTE — Progress Notes (Signed)
BP 123/76   Pulse 71   Temp (!) 97.5 F (36.4 C) (Oral)   Ht '5\' 6"'$  (1.676 m)   Wt 234 lb 6.4 oz (106.3 kg)   BMI 37.83 kg/m    Subjective:    Patient ID: Billy Rasmussen, male    DOB: [birth year], 72 y.o.   MRN: [id number]  HPI: Billy Rasmussen is a 72 y.o. male presenting on 02/10/2018 for Hyperlipidemia (6 month follow up ) This patient comes in for recheck on his chronic medical conditions.  He has had bladder surgery this past few months and is still dealing with hematuria.  They are trying to treat his bladder cancer in a different way.  All his medications are reviewed today.  He has had some urinary tract infections off and on since all of this is gone on.  He states otherwise he is feeling well except for his normal osteoarthritic joints.    Past Medical History:  Diagnosis Date  . Arthritis    knees, shoulders, elbows  . Bladder cancer [facility])    urologist-  dr Junious Silk  . Diverticulosis of colon   . Fibromyalgia   . GERD (gastroesophageal reflux disease)   . History of blood transfusion    age 64   . History of bronchitis   . History of diverticulitis of colon   . History of gastric ulcer    due to aleve  . Hypertension   . Incomplete left bundle branch block    told had irregular heartbeat 12-28-18-19, no referral to cardiology made  . Lower urinary tract symptoms (LUTS)   . OSA (obstructive sleep apnea)    NON-COMPLIANT  CPAP  --- BUT PT USES OXYGEN AT NIGHT 2.5L VIA [place] (PT'S DECISION)  . PONV (postoperative nausea and vomiting)   . Psoriasis    occ outbreaks on eyebrows chin and head, none recent  . Tinnitus    right ear more, has tranmitter in right ear removable at hs  . Urinary frequency   . Wears glasses   . Wears partial dentures    Relevant past medical, surgical, family and social history reviewed and updated as indicated. Interim medical history since our last visit reviewed. Allergies and medications reviewed and updated. DATA REVIEWED: CHART  IN EPIC  Family History reviewed for pertinent findings.  Review of Systems  Constitutional: Negative.  Negative for appetite change and fatigue.  HENT: Negative.   Eyes: Negative.  Negative for pain and visual disturbance.  Respiratory: Negative.  Negative for cough, chest tightness, shortness of breath and wheezing.   Cardiovascular: Negative.  Negative for chest pain, palpitations and leg swelling.  Gastrointestinal: Negative.  Negative for abdominal pain, diarrhea, nausea and vomiting.  Endocrine: Negative.   Genitourinary: Positive for difficulty urinating, dysuria, enuresis and hematuria.  Musculoskeletal: Negative.   Skin: Negative.  Negative for color change and rash.  Neurological: Negative.  Negative for weakness, numbness and headaches.  Psychiatric/Behavioral: Negative.     Allergies as of 02/10/2018   No Known Allergies     Medication List        Accurate as of 02/10/18 11:59 PM. Always use your most recent med list.          albuterol 108 (90 Base) MCG/ACT inhaler Commonly known as:  PROVENTIL HFA;VENTOLIN HFA Inhale 2 puffs into the lungs every 6 (six) hours as needed for wheezing or shortness of breath.   diclofenac 75 MG EC tablet Commonly known as:  VOLTAREN  Take 1 tablet (75 mg total) by mouth 2 (two) times daily.   docusate sodium 100 MG capsule Commonly known as:  COLACE Take 1 capsule (100 mg total) by mouth 2 (two) times daily.   isoniazid 300 MG tablet Commonly known as:  NYDRAZID Take 1 tablet (300 mg total) by mouth daily.   lisinopril-hydrochlorothiazide 20-12.5 MG tablet Commonly known as:  PRINZIDE,ZESTORETIC TAKE 2 TABLETS BY MOUTH ONCE DAILY   meclizine 25 MG tablet Commonly known as:  ANTIVERT take 1  Tablet by mouth 3 times daily AS NEEDED FOR dizziness          Objective:    BP 123/76   Pulse 71   Temp (!) 97.5 F (36.4 C) (Oral)   Ht '5\' 6"'$  (1.676 m)   Wt 234 lb 6.4 oz (106.3 kg)   BMI 37.83 kg/m   No Known  Allergies  Wt Readings from Last 3 Encounters:  02/10/18 234 lb 6.4 oz (106.3 kg)  02/03/18 222 lb (100.7 kg)  01/27/18 233 lb 3.2 oz (105.8 kg)    Physical Exam  Constitutional: He appears well-developed and well-nourished. No distress.  HENT:  Head: Normocephalic and atraumatic.  Eyes: Pupils are equal, round, and reactive to light. Conjunctivae and EOM are normal.  Cardiovascular: Normal rate, regular rhythm and normal heart sounds.  Pulmonary/Chest: Effort normal and breath sounds normal. No respiratory distress.  Skin: Skin is warm and dry.  Psychiatric: He has a normal mood and affect. His behavior is normal.  Nursing note and vitals reviewed.   Results for orders placed or performed in visit on 02/10/18  CMP14+EGFR  Result Value Ref Range   Glucose 90 65 - 99 mg/dL   BUN 16 8 - 27 mg/dL   Creatinine, Ser 1.03 0.76 - 1.27 mg/dL   GFR calc non Af Amer 73 >59 mL/min/1.73   GFR calc Af Amer 85 >59 mL/min/1.73   BUN/Creatinine Ratio 16 10 - 24   Sodium 141 134 - 144 mmol/L   Potassium 4.4 3.5 - 5.2 mmol/L   Chloride 98 96 - 106 mmol/L   CO2 25 20 - 29 mmol/L   Calcium 9.9 8.6 - 10.2 mg/dL   Total Protein 6.8 6.0 - 8.5 g/dL   Albumin 4.5 3.5 - 4.8 g/dL   Globulin, Total 2.3 1.5 - 4.5 g/dL   Albumin/Globulin Ratio 2.0 1.2 - 2.2   Bilirubin Total 0.7 0.0 - 1.2 mg/dL   Alkaline Phosphatase 52 39 - 117 IU/L   AST 45 (H) 0 - 40 IU/L   ALT 29 0 - 44 IU/L  CBC with Differential/Platelet  Result Value Ref Range   WBC 6.1 3.4 - 10.8 x10E3/uL   RBC 4.79 4.14 - 5.80 x10E6/uL   Hemoglobin 14.7 13.0 - 17.7 g/dL   Hematocrit 43.3 37.5 - 51.0 %   MCV 90 79 - 97 fL   MCH 30.7 26.6 - 33.0 pg   MCHC 33.9 31.5 - 35.7 g/dL   RDW 13.1 12.3 - 15.4 %   Platelets 162 150 - 450 x10E3/uL   Neutrophils 58 Not Estab. %   Lymphs 27 Not Estab. %   Monocytes 10 Not Estab. %   Eos 4 Not Estab. %   Basos 0 Not Estab. %   Neutrophils Absolute 3.6 1.4 - 7.0 x10E3/uL   Lymphocytes Absolute 1.6  0.7 - 3.1 x10E3/uL   Monocytes Absolute 0.6 0.1 - 0.9 x10E3/uL   EOS (ABSOLUTE) 0.2 0.0 - 0.4 x10E3/uL  Basophils Absolute 0.0 0.0 - 0.2 x10E3/uL   Immature Granulocytes 1 Not Estab. %   Immature Grans (Abs) 0.0 0.0 - 0.1 x10E3/uL  Lipid panel  Result Value Ref Range   Cholesterol, Total 152 100 - 199 mg/dL   Triglycerides 179 (H) 0 - 149 mg/dL   HDL 25 (L) >39 mg/dL   VLDL Cholesterol Cal 36 5 - 40 mg/dL   LDL Calculated 91 0 - 99 mg/dL   Chol/HDL Ratio 6.1 (H) 0.0 - 5.0 ratio      Assessment & Plan:   1. Essential hypertension - CMP14+EGFR - CBC with Differential/Platelet - Lipid panel  2. Malignant neoplasm of urinary bladder, unspecified site (HCC)  3. Pure hypercholesterolemia - CMP14+EGFR - CBC with Differential/Platelet - Lipid panel  4. Screening for colon cancer - Ambulatory referral to Gastroenterology   Continue all other maintenance medications as listed above.  Follow up plan: Return in about 6 months (around 08/11/2018).  Educational handout given for [place] PA-C [place] [address]  [place], [place] [id number] [phone]   02/14/2018, 1:33 PM

## 2018-03-01 ENCOUNTER — Encounter: Payer: Self-pay | Admitting: Internal Medicine

## 2018-03-01 ENCOUNTER — Ambulatory Visit (INDEPENDENT_AMBULATORY_CARE_PROVIDER_SITE_OTHER): Payer: Medicare Other | Admitting: Internal Medicine

## 2018-03-01 DIAGNOSIS — T50A95A Adverse effect of other bacterial vaccines, initial encounter: Secondary | ICD-10-CM | POA: Diagnosis not present

## 2018-03-01 DIAGNOSIS — Z23 Encounter for immunization: Secondary | ICD-10-CM

## 2018-03-01 DIAGNOSIS — N3080 Other cystitis without hematuria: Secondary | ICD-10-CM

## 2018-03-01 NOTE — Assessment & Plan Note (Signed)
He had a very good response to isoniazid therapy for BCG cystitis.  I do not think he needs any further evaluation or treatment at this time.  If he has recurrence of his bladder cancer in the future I think it could still be reasonable to retreat with BCG installations.  If he has severe, recurrent cystitis symptoms he can always be retreated.  He can follow-up here as needed.

## 2018-03-01 NOTE — Progress Notes (Signed)
[facility]  Patient Active Problem List   Diagnosis Date Noted  . BCG cystitis 02/02/2018    Priority: High  . Malignant neoplasm of urinary bladder ([place]) 10/02/2016    Priority: High  . Acute cystitis with hematuria 10/02/2016  . Pure hypercholesterolemia 10/02/2016  . Lipoma 09/30/2016  . Obese 03/31/2016  . S/P right TKA 03/30/2016  . S/P knee replacement 03/30/2016  . HTN (hypertension) 02/25/2016  . Healthcare maintenance 02/25/2016  . Obesity (BMI 30-39.9) 01/07/2016  . S/P shoulder replacement 07/20/2013  . Shoulder arthritis 04/07/2013  . CELLULITIS, KNEE, LEFT 08/26/2006    Patient's Medications  New Prescriptions   No medications on file  Previous Medications   ALBUTEROL (PROVENTIL HFA;VENTOLIN HFA) 108 (90 BASE) MCG/ACT INHALER    Inhale 2 puffs into the lungs every 6 (six) hours as needed for wheezing or shortness of breath.   DICLOFENAC (VOLTAREN) 75 MG EC TABLET    Take 1 tablet (75 mg total) by mouth 2 (two) times daily.   DOCUSATE SODIUM (COLACE) 100 MG CAPSULE    Take 1 capsule (100 mg total) by mouth 2 (two) times daily.   LISINOPRIL-HYDROCHLOROTHIAZIDE (PRINZIDE,ZESTORETIC) 20-12.5 MG TABLET    TAKE 2 TABLETS BY MOUTH ONCE DAILY   MECLIZINE (ANTIVERT) 25 MG TABLET    take 1  Tablet by mouth 3 times daily AS NEEDED FOR dizziness  Modified Medications   No medications on file  Discontinued Medications   ISONIAZID (NYDRAZID) 300 MG TABLET    Take 1 tablet (300 mg total) by mouth daily.    Subjective: Mr. Sondgeroth is in for his routine follow-up visit.  He completed 3 weeks of isoniazid about 1 week ago.  He is feeling much better.  His dysuria hesitancy and frequency have almost completely resolved.  He still has some urgency.  He did not have any problems tolerating isoniazid.  Review of Systems: Review of Systems  Constitutional: Negative for chills, diaphoresis, fever and weight loss.  Genitourinary: Positive for  urgency. Negative for dysuria and frequency.    Past Medical History:  Diagnosis Date  . Arthritis    knees, shoulders, elbows  . Bladder cancer [facility])    urologist-  dr Junious Silk  . Diverticulosis of colon   . Fibromyalgia   . GERD (gastroesophageal reflux disease)   . History of blood transfusion    age 72   . History of bronchitis   . History of diverticulitis of colon   . History of gastric ulcer    due to aleve  . Hypertension   . Incomplete left bundle branch block    told had irregular heartbeat 12-28-18-19, no referral to cardiology made  . Lower urinary tract symptoms (LUTS)   . OSA (obstructive sleep apnea)    NON-COMPLIANT  CPAP  --- BUT PT USES OXYGEN AT NIGHT 2.5L VIA [place] (PT'S DECISION)  . PONV (postoperative nausea and vomiting)   . Psoriasis    occ outbreaks on eyebrows chin and head, none recent  . Tinnitus    right ear more, has tranmitter in right ear removable at hs  . Urinary frequency   . Wears glasses   . Wears partial dentures     Social History   Tobacco Use  . Smoking status: Former Smoker    Packs/day: 2.00    Years: 40.00    Pack years: 80.00    Types: Cigarettes    Last attempt to  quit: 06/01/1997    Years since quitting: 20.7  . Smokeless tobacco: Never Used  Substance Use Topics  . Alcohol use: No  . Drug use: No    Family History  Problem Relation Age of Onset  . Alzheimer's disease Mother   . Heart attack Father   . Heart disease Father   . Irregular heart beat Brother        DEFIB.  . Congestive Heart Failure Brother   . Diabetes Daughter     No Known Allergies  Objective: Vitals:   03/01/18 0926  BP: 133/83  Pulse: 80  Temp: 98.9 F (37.2 C)  Weight: 233 lb (105.7 kg)  Height: 5\' 6"  (1.676 m)   Body mass index is 37.61 kg/m.  Physical Exam  Constitutional: He is oriented to person, place, and time.  He is in good spirits.  Abdominal: Soft. He exhibits no distension. There is no tenderness.  Neurological: He  is alert and oriented to person, place, and time.  Skin: No rash noted.  Psychiatric: He has a normal mood and affect.    Lab Results    Problem List Items Addressed This Visit      High   BCG cystitis    He had a very good response to isoniazid therapy for BCG cystitis.  I do not think he needs any further evaluation or treatment at this time.  If he has recurrence of his bladder cancer in the future I think it could still be reasonable to retreat with BCG installations.  If he has severe, recurrent cystitis symptoms he can always be retreated.  He can follow-up here as needed.          Michel Bickers, MD [facility] [phone] pager   [phone] cell 03/01/2018, 9:42 AM

## 2018-03-08 ENCOUNTER — Telehealth: Payer: Self-pay | Admitting: Physician Assistant

## 2018-03-08 ENCOUNTER — Other Ambulatory Visit: Payer: Self-pay | Admitting: Physician Assistant

## 2018-03-08 DIAGNOSIS — Z1211 Encounter for screening for malignant neoplasm of colon: Secondary | ICD-10-CM

## 2018-03-08 NOTE — Telephone Encounter (Signed)
Patient aware.

## 2018-03-08 NOTE — Telephone Encounter (Signed)
I have reviewed his charts and I do not see any evidence of having problems on a prior colonoscopy.  Please ask him if he has ever had a colonoscopy with polyps.  Also ask if he has a strong family history of colon cancer.  Otherwise a Cologuard can be ordered.

## 2018-03-08 NOTE — Telephone Encounter (Signed)
Ordering cologuard

## 2018-03-08 NOTE — Telephone Encounter (Signed)
Patient states that 10-11 years ago he had non cancerous polyps on his colonoscopy. States that there is no family hx of colon cancer.

## 2018-03-08 NOTE — Telephone Encounter (Signed)
Please advise if patient can do cologuard instead of colonoscopy.

## 2018-03-10 ENCOUNTER — Other Ambulatory Visit: Payer: Self-pay | Admitting: Physician Assistant

## 2018-03-15 ENCOUNTER — Ambulatory Visit (INDEPENDENT_AMBULATORY_CARE_PROVIDER_SITE_OTHER): Payer: Self-pay

## 2018-03-15 DIAGNOSIS — Z1211 Encounter for screening for malignant neoplasm of colon: Secondary | ICD-10-CM

## 2018-03-15 MED ORDER — PEG 3350-KCL-NA BICARB-NACL 420 G PO SOLR: 4000.0000 mL | ORAL | 0 refills | Status: DC

## 2018-03-15 NOTE — Progress Notes (Signed)
Gastroenterology Pre-Procedure Review  Request Date:03/15/18 Requesting Physician: Particia Nearing PA [facility]- previous tcs 11 years ago in [place]- pt reports he had a couple of polyps.   PATIENT REVIEW QUESTIONS: The patient responded to the following health history questions as indicated:    1. Diabetes Melitis: no 2. Joint replacements in the past 12 months: no 3. Major health problems in the past 3 months: no 4. Has an artificial valve or MVP: no 5. Has a defibrillator: no 6. Has been advised in past to take antibiotics in advance of a procedure like teeth cleaning: no 7. Family history of colon cancer: no  8. Alcohol Use: no 9. History of sleep apnea: yes (no cpap)  10. History of coronary artery or other vascular stents placed within the last 12 months: no 11. History of any prior anesthesia complications: yes (gas anesthesia =vomiting, no problems with IV anesthesia)    MEDICATIONS & ALLERGIES:    Patient reports the following regarding taking any blood thinners:   Plavix? no Aspirin? no Coumadin? no Brilinta? no Xarelto? no Eliquis? no Pradaxa? no Savaysa? no Effient? no  Patient confirms/reports the following medications:  Current Outpatient Medications  Medication Sig Dispense Refill  . b complex vitamins capsule Take 1 capsule by mouth daily.    . diclofenac (VOLTAREN) 75 MG EC tablet Take 1 tablet (75 mg total) by mouth 2 (two) times daily. 180 tablet 0  . docusate sodium (COLACE) 100 MG capsule Take 1 capsule (100 mg total) by mouth 2 (two) times daily. 10 capsule 0  . lisinopril-hydrochlorothiazide (PRINZIDE,ZESTORETIC) 20-12.5 MG tablet TAKE 2 TABLETS EVERY DAY 180 tablet 1  . meclizine (ANTIVERT) 25 MG tablet take 1  Tablet by mouth 3 times daily AS NEEDED FOR dizziness 40 tablet 1   No current facility-administered medications for this visit.     Patient confirms/reports the following allergies:  No Known Allergies  No orders of the defined types were placed in  this encounter.   AUTHORIZATION INFORMATION Primary Insurance: medicare,  ID #: [id number] Pre-Cert / Auth required: no  SCHEDULE INFORMATION: Procedure has been scheduled as follows:  Date: 05/11/18, Time: 9:30 Location: [facility] Dr[facility]Rourk  This Gastroenterology Pre-Precedure Review Form is being routed to the following provider(s): Neil Crouch, PA

## 2018-03-15 NOTE — Patient Instructions (Addendum)
ZOHAN SHIFLET   [birth year] MRN: [id number]    Procedure Date: 05/11/18 Time to register: 8:30am Place to register: [facility] Procedure Time: 9:30am Scheduled provider: R. Garfield Cornea, MD  PREPARATION FOR COLONOSCOPY WITH TRI-LYTE SPLIT PREP  Please notify us immediately if you are diabetic, take iron supplements, or if you are on Coumadin or any other blood thinners.   You will need to purchase 1 fleet enema and 1 box of Bisacodyl 53m tablets.   2 DAYS BEFORE PROCEDURE:  DATE: 05/09/18   DAY: Monday Begin clear liquid diet AFTER your lunch meal. NO SOLID FOODS after this point.  1 DAY BEFORE PROCEDURE:  DATE: 05/10/18   DAY: Tuesday Continue clear liquids the entire day - NO SOLID FOOD.   At 2:00 pm:  Take 2 Bisacodyl tablets.   At 4:00pm:  Start drinking your solution. Make sure you mix well per instructions on the bottle. Try to drink 1 (one) 8 ounce glass every 10-15 minutes until you have consumed HALF the jug. You should complete by 6:00pm.You must keep the left over solution refrigerated until completed next day.  Continue clear liquids. You must drink plenty of clear liquids to prevent dehyration and kidney failure.     DAY OF PROCEDURE:   DATE: 05/11/18  DAY: Wednesday If you take medications for your heart, blood pressure or breathing, you may take these medications.   Five hours before your procedure time @ 4:30am:  Finish remaining amout of bowel prep, drinking 1 (one) 8 ounce glass every 10-15 minutes until complete. You have two hours to consume remaining prep.   Three hours before your procedure time _0 :30am:  Nothing by mouth.   At least one hour before going to the hospital:  Give yourself one Fleet enema.   You may take your morning medications with sip of water unless we have instructed otherwise.      Please see below for Dietary Information.  CLEAR LIQUIDS INCLUDE:  Water Jello (NOT red in color)   Ice Popsicles (NOT red in color)    Tea (sugar ok, no milk/cream) Powdered fruit flavored drinks  Coffee (sugar ok, no milk/cream) Gatorade/ Lemonade/ Kool-Aid  (NOT red in color)   Juice: apple, white grape, white cranberry Soft drinks  Clear bullion, consomme, broth (fat free beef/chicken/vegetable)  Carbonated beverages (any kind)  Strained chicken noodle soup Hard Candy   Remember: Clear liquids are liquids that will allow you to see your fingers on the other side of a clear glass. Be sure liquids are NOT red in color, and not cloudy, but CLEAR.  DO NOT EAT OR DRINK ANY OF THE FOLLOWING:  Dairy products of any kind   Cranberry juice Tomato juice / V8 juice   Grapefruit juice Orange juice     Red grape juice  Do not eat any solid foods, including such foods as: cereal, oatmeal, yogurt, fruits, vegetables, creamed soups, eggs, bread, crackers, pureed foods in a blender, etc.   HELPFUL HINTS FOR DRINKING PREP SOLUTION:   Make sure prep is extremely cold. Mix and refrigerate the the morning of the prep. You may also put in the freezer.   You may try mixing some Crystal Light or Country Time Lemonade if you prefer. Mix in small amounts; add more if necessary.  Try drinking through a straw  Rinse mouth with water or a mouthwash between glasses, to remove after-taste.  Try sipping on a cold beverage /ice/ popsicles between glasses of prep.  Place a piece of sugar-free hard candy in mouth between glasses.  If you become nauseated, try consuming smaller amounts, or stretch out the time between glasses. Stop for 30-60 minutes, then slowly start back drinking.        OTHER INSTRUCTIONS  You will need a responsible adult at least 70 years of age to accompany you and drive you home. This person must remain in the waiting room during your procedure. The hospital will cancel your procedure if you do not have a responsible adult with you.   1. Wear loose fitting clothing that is easily removed. 2. Leave jewelry and  other valuables at home.  3. Remove all body piercing jewelry and leave at home. 4. Total time from sign-in until discharge is approximately 2-3 hours. 5. You should go home directly after your procedure and rest. You can resume normal activities the day after your procedure. 6. The day of your procedure you should not:  Drive  Make legal decisions  Operate machinery  Drink alcohol  Return to work   You may call the office (Dept: [phone]) before 5:00pm, or page the doctor on call [phone]) after 5:00pm, for further instructions, if necessary.   Insurance Information YOU WILL NEED TO CHECK WITH YOUR INSURANCE COMPANY FOR THE BENEFITS OF COVERAGE YOU HAVE FOR THIS PROCEDURE.  UNFORTUNATELY, NOT ALL INSURANCE COMPANIES HAVE BENEFITS TO COVER ALL OR PART OF THESE TYPES OF PROCEDURES.  IT IS YOUR RESPONSIBILITY TO CHECK YOUR BENEFITS, HOWEVER, WE WILL BE GLAD TO ASSIST YOU WITH ANY CODES YOUR INSURANCE COMPANY MAY NEED.    PLEASE NOTE THAT MOST INSURANCE COMPANIES WILL NOT COVER A SCREENING COLONOSCOPY FOR PEOPLE UNDER THE AGE OF 50  IF YOU HAVE BCBS INSURANCE, YOU MAY HAVE BENEFITS FOR A SCREENING COLONOSCOPY BUT IF POLYPS ARE FOUND THE DIAGNOSIS WILL CHANGE AND THEN YOU MAY HAVE A DEDUCTIBLE THAT WILL NEED TO BE MET. SO PLEASE MAKE SURE YOU CHECK YOUR BENEFITS FOR A SCREENING COLONOSCOPY AS WELL AS A DIAGNOSTIC COLONOSCOPY.

## 2018-03-22 ENCOUNTER — Other Ambulatory Visit: Payer: Self-pay | Admitting: Physician Assistant

## 2018-03-24 LAB — MYCOBACTERIA,CULT W/FLUOROCHROME SMEAR
MICRO NUMBER:: 91067322
SMEAR:: NONE SEEN
SPECIMEN QUALITY:: ADEQUATE

## 2018-03-24 NOTE — Progress Notes (Signed)
Ok to schedule. Make endo aware of sleep apnea.

## 2018-03-25 NOTE — Progress Notes (Signed)
done

## 2018-03-25 NOTE — Addendum Note (Signed)
Addended by: Claudina Lick on: 03/25/2018 08:46 AM   Modules accepted: Orders, SmartSet

## 2018-04-20 DIAGNOSIS — C674 Malignant neoplasm of posterior wall of bladder: Secondary | ICD-10-CM | POA: Diagnosis not present

## 2018-04-20 DIAGNOSIS — N401 Enlarged prostate with lower urinary tract symptoms: Secondary | ICD-10-CM | POA: Diagnosis not present

## 2018-04-20 DIAGNOSIS — R3912 Poor urinary stream: Secondary | ICD-10-CM | POA: Diagnosis not present

## 2018-04-20 DIAGNOSIS — N5201 Erectile dysfunction due to arterial insufficiency: Secondary | ICD-10-CM | POA: Diagnosis not present

## 2018-05-11 ENCOUNTER — Ambulatory Visit (HOSPITAL_COMMUNITY)
Admission: RE | Admit: 2018-05-11 | Discharge: 2018-05-11 | Disposition: A | Discharge status: Home / Self Care | Payer: Medicare Other | Attending: Internal Medicine | Admitting: Internal Medicine

## 2018-05-11 ENCOUNTER — Encounter (HOSPITAL_COMMUNITY): Payer: Self-pay | Admitting: *Deleted

## 2018-05-11 ENCOUNTER — Encounter (HOSPITAL_COMMUNITY)
Admission: RE | Disposition: A | Discharge status: Home / Self Care | Payer: Self-pay | Source: Home / Self Care | Attending: Internal Medicine

## 2018-05-11 ENCOUNTER — Other Ambulatory Visit: Payer: Self-pay

## 2018-05-11 DIAGNOSIS — G4733 Obstructive sleep apnea (adult) (pediatric): Secondary | ICD-10-CM | POA: Insufficient documentation

## 2018-05-11 DIAGNOSIS — Z8551 Personal history of malignant neoplasm of bladder: Secondary | ICD-10-CM | POA: Diagnosis not present

## 2018-05-11 DIAGNOSIS — Z96653 Presence of artificial knee joint, bilateral: Secondary | ICD-10-CM | POA: Insufficient documentation

## 2018-05-11 DIAGNOSIS — Z87891 Personal history of nicotine dependence: Secondary | ICD-10-CM | POA: Insufficient documentation

## 2018-05-11 DIAGNOSIS — Z1211 Encounter for screening for malignant neoplasm of colon: Secondary | ICD-10-CM | POA: Insufficient documentation

## 2018-05-11 DIAGNOSIS — K621 Rectal polyp: Secondary | ICD-10-CM | POA: Diagnosis not present

## 2018-05-11 DIAGNOSIS — Z96612 Presence of left artificial shoulder joint: Secondary | ICD-10-CM | POA: Diagnosis not present

## 2018-05-11 DIAGNOSIS — Z791 Long term (current) use of non-steroidal anti-inflammatories (NSAID): Secondary | ICD-10-CM | POA: Insufficient documentation

## 2018-05-11 DIAGNOSIS — K5730 Diverticulosis of large intestine without perforation or abscess without bleeding: Secondary | ICD-10-CM | POA: Insufficient documentation

## 2018-05-11 DIAGNOSIS — I10 Essential (primary) hypertension: Secondary | ICD-10-CM | POA: Insufficient documentation

## 2018-05-11 DIAGNOSIS — Z79899 Other long term (current) drug therapy: Secondary | ICD-10-CM | POA: Insufficient documentation

## 2018-05-11 DIAGNOSIS — Z96611 Presence of right artificial shoulder joint: Secondary | ICD-10-CM | POA: Insufficient documentation

## 2018-05-11 HISTORY — PX: POLYPECTOMY: SHX5525

## 2018-05-11 HISTORY — PX: COLONOSCOPY: SHX5424

## 2018-05-11 SURGERY — COLONOSCOPY
Anesthesia: Moderate Sedation

## 2018-05-11 MED ORDER — MIDAZOLAM HCL 5 MG/5ML IJ SOLN
INTRAMUSCULAR | Status: AC
  Filled 2018-05-11: qty 10

## 2018-05-11 MED ORDER — MIDAZOLAM HCL 5 MG/5ML IJ SOLN
INTRAMUSCULAR | Status: DC | PRN
  Administered 2018-05-11 (×2): 1 mg via INTRAVENOUS
  Administered 2018-05-11: 2 mg via INTRAVENOUS
  Administered 2018-05-11: 1 mg via INTRAVENOUS

## 2018-05-11 MED ORDER — MEPERIDINE HCL 50 MG/ML IJ SOLN
INTRAMUSCULAR | Status: AC
  Filled 2018-05-11: qty 1

## 2018-05-11 MED ORDER — ONDANSETRON HCL 4 MG/2ML IJ SOLN
INTRAMUSCULAR | Status: AC
  Filled 2018-05-11: qty 2

## 2018-05-11 MED ORDER — MEPERIDINE HCL 100 MG/ML IJ SOLN
INTRAMUSCULAR | Status: DC | PRN
  Administered 2018-05-11: 25 mg

## 2018-05-11 MED ORDER — SODIUM CHLORIDE 0.9 % IV SOLN
INTRAVENOUS | Status: DC
  Administered 2018-05-11: 1000 mL via INTRAVENOUS

## 2018-05-11 MED ORDER — STERILE WATER FOR IRRIGATION IR SOLN
Status: DC | PRN
  Administered 2018-05-11: 1.5 mL

## 2018-05-11 NOTE — Op Note (Signed)
[facility] Patient Name: Billy Rasmussen Procedure Date: 05/11/2018 9:03 AM MRN: [id number] Date of Birth: [birth year] Attending MD: Norvel Richards , MD CSN: [id number] Age: 72 Admit Type: Outpatient Procedure:                Colonoscopy Indications:              Screening for colorectal malignant neoplasm,                            Screening for colorectal malignant neoplasm (last                            colonoscopy was more than 10 years ago) Providers:                Norvel Richards, MD, Gerome Sam, RN,                            Aram Candela Referring MD:              Medicines:                Midazolam 5 mg IV, Meperidine 25 mg IV, Ondansetron                            4 mg IV Complications:            No immediate complications. Estimated Blood Loss:     Estimated blood loss was minimal. Procedure:                Pre-Anesthesia Assessment:                           - Prior to the procedure, a History and Physical                            was performed, and patient medications and                            allergies were reviewed. The patient's tolerance of                            previous anesthesia was also reviewed. The risks                            and benefits of the procedure and the sedation                            options and risks were discussed with the patient.                            All questions were answered, and informed consent                            was obtained. Prior Anticoagulants: The patient has  taken no previous anticoagulant or antiplatelet                            agents. ASA Grade Assessment: III - A patient with                            severe systemic disease. After reviewing the risks                            and benefits, the patient was deemed in                            satisfactory condition to undergo the procedure.                           After obtaining informed  consent, the colonoscope                            was passed under direct vision. Throughout the                            procedure, the patient's blood pressure, pulse, and                            oxygen saturations were monitored continuously. The                            CF-HQ190L ([id number]) scope was introduced through                            the anus and advanced to the the cecum, identified                            by appendiceal orifice and ileocecal valve. The                            colonoscopy was performed without difficulty. The                            patient tolerated the procedure well. The quality                            of the bowel preparation was adequate. Scope In: 9:40:07 AM Scope Out: 9:55:30 AM Scope Withdrawal Time: 0 hours 8 minutes 38 seconds  Total Procedure Duration: 0 hours 15 minutes 23 seconds  Findings:      The perianal and digital rectal examinations were normal.      Multiple medium-mouthed diverticula were found in the sigmoid colon and       descending colon.      A 5 mm polyp was found in the rectum. The polyp was sessile. The polyp       was removed with a cold snare. Resection and retrieval were complete.       Estimated blood loss was minimal. Impression:               -  Diverticulosis in the sigmoid colon and in the                            descending colon.                           - One 5 mm polyp in the rectum, removed with a cold                            snare. Resected and retrieved. Moderate Sedation:      Moderate (conscious) sedation was administered by the endoscopy nurse       and supervised by the endoscopist. The following parameters were       monitored: oxygen saturation, heart rate, blood pressure, respiratory       rate, EKG, adequacy of pulmonary ventilation, and response to care.       Total physician intraservice time was 20 minutes. Recommendation:           - Patient has a contact number available  for                            emergencies. The signs and symptoms of potential                            delayed complications were discussed with the                            patient. Return to normal activities tomorrow.                            Written discharge instructions were provided to the                            patient.                           - Advance diet as tolerated.                           - Continue present medications.                           - Repeat colonoscopy date to be determined after                            pending pathology results are reviewed for                            surveillance based on pathology results.                           - Return to GI office (date not yet determined). Procedure Code(s):        --- Professional ---                           [id number], Colonoscopy, flexible; with removal  of                            tumor(s), polyp(s), or other lesion(s) by snare                            technique                           G0500, Moderate sedation services provided by the                            same physician or other qualified health care                            professional performing a gastrointestinal                            endoscopic service that sedation supports,                            requiring the presence of an independent trained                            observer to assist in the monitoring of the                            patient's level of consciousness and physiological                            status; initial 15 minutes of intra-service time;                            patient age 71 years or older (additional time may                            be reported with [phone], as appropriate) Diagnosis Code(s):        --- Professional ---                           K62.1, Rectal polyp                           Z12.11, Encounter for screening for malignant                            neoplasm of colon                            K57.30, Diverticulosis of large intestine without                            perforation or abscess without bleeding CPT copyright 2018 American Medical Association. All rights reserved. The codes documented in this report are preliminary and upon coder review may  be revised to meet current compliance requirements. Cristopher Estimable. Gala Romney, MD  Norvel Richards, MD 05/11/2018 10:09:44 AM This report has been signed electronically. Number of Addenda: 0

## 2018-05-11 NOTE — Discharge Instructions (Signed)
Colonoscopy Discharge Instructions  Read the instructions outlined below and refer to this sheet in the next few weeks. These discharge instructions provide you with general information on caring for yourself after you leave the hospital. Your doctor may also give you specific instructions. While your treatment has been planned according to the most current medical practices available, unavoidable complications occasionally occur. If you have any problems or questions after discharge, call Dr. Gala Romney at [phone]. ACTIVITY  You may resume your regular activity, but move at a slower pace for the next 24 hours.   Take frequent rest periods for the next 24 hours.   Walking will help get rid of the air and reduce the bloated feeling in your belly (abdomen).   No driving for 24 hours (because of the medicine (anesthesia) used during the test).    Do not sign any important legal documents or operate any machinery for 24 hours (because of the anesthesia used during the test).  NUTRITION  Drink plenty of fluids.   You may resume your normal diet as instructed by your doctor.   Begin with a light meal and progress to your normal diet. Heavy or fried foods are harder to digest and may make you feel sick to your stomach (nauseated).   Avoid alcoholic beverages for 24 hours or as instructed.  MEDICATIONS  You may resume your normal medications unless your doctor tells you otherwise.  WHAT YOU CAN EXPECT TODAY  Some feelings of bloating in the abdomen.   Passage of more gas than usual.   Spotting of blood in your stool or on the toilet paper.  IF YOU HAD POLYPS REMOVED DURING THE COLONOSCOPY:  No aspirin products for 7 days or as instructed.   No alcohol for 7 days or as instructed.   Eat a soft diet for the next 24 hours.  FINDING OUT THE RESULTS OF YOUR TEST Not all test results are available during your visit. If your test results are not back during the visit, make an appointment  with your caregiver to find out the results. Do not assume everything is normal if you have not heard from your caregiver or the medical facility. It is important for you to follow up on all of your test results.  SEEK IMMEDIATE MEDICAL ATTENTION IF:  You have more than a spotting of blood in your stool.   Your belly is swollen (abdominal distention).   You are nauseated or vomiting.   You have a temperature over 101.   You have abdominal pain or discomfort that is severe or gets worse throughout the day.    Diverticulosis and colon polyp information provided  Further recommendations to follow pending review of pathology report   Diverticulosis Diverticulosis is a condition that develops when small pouches (diverticula) form in the wall of the large intestine (colon). The colon is where water is absorbed and stool is formed. The pouches form when the inside layer of the colon pushes through weak spots in the outer layers of the colon. You may have a few pouches or many of them. What are the causes? The cause of this condition is not known. What increases the risk? The following factors may make you more likely to develop this condition:  Being older than age 75. Your risk for this condition increases with age. Diverticulosis is rare among people younger than age 4. By age 31, many people have it.  Eating a low-fiber diet.  Having frequent constipation.  Being overweight.  Not getting enough exercise.  Smoking.  Taking over-the-counter pain medicines, like aspirin and ibuprofen.  Having a family history of diverticulosis.  What are the signs or symptoms? In most people, there are no symptoms of this condition. If you do have symptoms, they may include:  Bloating.  Cramps in the abdomen.  Constipation or diarrhea.  Pain in the lower left side of the abdomen.  How is this diagnosed? This condition is most often diagnosed during an exam for other colon problems.  Because diverticulosis usually has no symptoms, it often cannot be diagnosed independently. This condition may be diagnosed by:  Using a flexible scope to examine the colon (colonoscopy).  Taking an X-ray of the colon after dye has been put into the colon (barium enema).  Doing a CT scan.  How is this treated? You may not need treatment for this condition if you have never developed an infection related to diverticulosis. If you have had an infection before, treatment may include:  Eating a high-fiber diet. This may include eating more fruits, vegetables, and grains.  Taking a fiber supplement.  Taking a live bacteria supplement (probiotic).  Taking medicine to relax your colon.  Taking antibiotic medicines.  Follow these instructions at home:  Drink 6-8 glasses of water or more each day to prevent constipation.  Try not to strain when you have a bowel movement.  If you have had an infection before: ? Eat more fiber as directed by your health care provider or your diet and nutrition specialist (dietitian). ? Take a fiber supplement or probiotic, if your health care provider approves.  Take over-the-counter and prescription medicines only as told by your health care provider.  If you were prescribed an antibiotic, take it as told by your health care provider. Do not stop taking the antibiotic even if you start to feel better.  Keep all follow-up visits as told by your health care provider. This is important. Contact a health care provider if:  You have pain in your abdomen.  You have bloating.  You have cramps.  You have not had a bowel movement in 3 days. Get help right away if:  Your pain gets worse.  Your bloating becomes very bad.  You have a fever or chills, and your symptoms suddenly get worse.  You vomit.  You have bowel movements that are bloody or black.  You have bleeding from your rectum. Summary  Diverticulosis is a condition that develops when  small pouches (diverticula) form in the wall of the large intestine (colon).  You may have a few pouches or many of them.  This condition is most often diagnosed during an exam for other colon problems.  If you have had an infection related to diverticulosis, treatment may include increasing the fiber in your diet, taking supplements, or taking medicines. This information is not intended to replace advice given to you by your health care provider. Make sure you discuss any questions you have with your health care provider. Document Released: 02/13/2004 Document Revised: 04/06/2016 Document Reviewed: 04/06/2016 Elsevier Interactive Patient Education  2017 [place].  Colon Polyps Polyps are tissue growths inside the body. Polyps can grow in many places, including the large intestine (colon). A polyp may be a round bump or a mushroom-shaped growth. You could have one polyp or several. Most colon polyps are noncancerous (benign). However, some colon polyps can become cancerous over time. What are the causes? The exact cause of colon polyps is not known. What  known. °What increases the risk? °This condition is more likely to develop in people who: °· Have a family history of colon cancer or colon polyps. °· Are older than 50 or older than 45 if they are African American. °· Have inflammatory bowel disease, such as ulcerative colitis or Crohn disease. °· Are overweight. °· Smoke cigarettes. °· Do not get enough exercise. °· Drink too much alcohol. °· Eat a diet that is: °? High in fat and red meat. °? Low in fiber. °· Had childhood cancer that was treated with abdominal radiation. ° °What are the signs or symptoms? °Most polyps do not cause symptoms. If you have symptoms, they may include: °· Blood coming from your rectum when having a bowel movement. °· Blood in your stool. The stool may look dark red or black. °· A change in bowel habits, such as constipation or diarrhea. ° °How is this diagnosed? °This  condition is diagnosed with a colonoscopy. This is a procedure that uses a lighted, flexible scope to look at the inside of your colon. °How is this treated? °Treatment for this condition involves removing any polyps that are found. Those polyps will then be tested for cancer. If cancer is found, your health care provider will talk to you about options for colon cancer treatment. °Follow these instructions at home: °Diet °· Eat plenty of fiber, such as fruits, vegetables, and whole grains. °· Eat foods that are high in calcium and vitamin D, such as milk, cheese, yogurt, eggs, liver, fish, and broccoli. °· Limit foods high in fat, red meats, and processed meats, such as hot dogs, sausage, bacon, and lunch meats. °· Maintain a healthy weight, or lose weight if recommended by your health care provider. °General instructions °· Do not smoke cigarettes. °· Do not drink alcohol excessively. °· Keep all follow-up visits as told by your health care provider. This is important. This includes keeping regularly scheduled colonoscopies. Talk to your health care provider about when you need a colonoscopy. °· Exercise every day or as told by your health care provider. °Contact a health care provider if: °· You have new or worsening bleeding during a bowel movement. °· You have new or increased blood in your stool. °· You have a change in bowel habits. °· You unexpectedly lose weight. °This information is not intended to replace advice given to you by your health care provider. Make sure you discuss any questions you have with your health care provider. °Document Released: 02/12/2004 Document Revised: 10/24/2015 Document Reviewed: 04/08/2015 °Elsevier Interactive Patient Education © 2018 Elsevier Inc. ° °
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
## 2018-05-12 ENCOUNTER — Encounter: Payer: Self-pay | Admitting: Internal Medicine

## 2018-05-16 ENCOUNTER — Telehealth: Payer: Self-pay | Admitting: Internal Medicine

## 2018-05-17 ENCOUNTER — Encounter (HOSPITAL_COMMUNITY): Payer: Self-pay | Admitting: Internal Medicine

## 2018-06-24 DIAGNOSIS — H04123 Dry eye syndrome of bilateral lacrimal glands: Secondary | ICD-10-CM | POA: Diagnosis not present

## 2018-06-24 DIAGNOSIS — H43812 Vitreous degeneration, left eye: Secondary | ICD-10-CM | POA: Diagnosis not present

## 2018-06-24 DIAGNOSIS — H2513 Age-related nuclear cataract, bilateral: Secondary | ICD-10-CM | POA: Diagnosis not present

## 2018-06-24 DIAGNOSIS — H43393 Other vitreous opacities, bilateral: Secondary | ICD-10-CM | POA: Diagnosis not present

## 2018-07-06 ENCOUNTER — Ambulatory Visit (INDEPENDENT_AMBULATORY_CARE_PROVIDER_SITE_OTHER): Payer: Medicare Other | Admitting: Physician Assistant

## 2018-07-06 ENCOUNTER — Encounter: Payer: Self-pay | Admitting: Physician Assistant

## 2018-07-06 VITALS — BP 142/87 | HR 82 | Temp 97.2°F | Ht 66.0 in | Wt 239.4 lb

## 2018-07-06 DIAGNOSIS — G629 Polyneuropathy, unspecified: Secondary | ICD-10-CM | POA: Diagnosis not present

## 2018-07-06 DIAGNOSIS — D1721 Benign lipomatous neoplasm of skin and subcutaneous tissue of right arm: Secondary | ICD-10-CM | POA: Diagnosis not present

## 2018-07-06 MED ORDER — GABAPENTIN 100 MG PO CAPS: 100.0000 mg | ORAL_CAPSULE | Freq: Three times a day (TID) | ORAL | 3 refills | Status: DC

## 2018-07-10 NOTE — Progress Notes (Signed)
BP (!) 142/87   Pulse 82   Temp (!) 97.2 F (36.2 C) (Oral)   Ht '5\' 6"'$  (1.676 m)   Wt 239 lb 6 oz (108.6 kg)   BMI 38.64 kg/m    Subjective:    Patient ID: Billy Rasmussen, male    DOB: 05-25-1946, 73 y.o.   MRN: [id number]  HPI: Billy Rasmussen is a 73 y.o. male presenting on 07/06/2018 for Hip Pain (pt here today c/o pain on the right side where he had his colostomy and has concerns)  This patient comes in for periodic recheck on medications and conditions including neuropathy on the abdomen.  He had colostomy a long time ago, closed now. In the area where the scar was he is feeling nerve pulling, but no deformity. He also has some lipomas that are starting to get more tender.  He would like to see genral surgery for them..   All medications are reviewed today. There are no reports of any problems with the medications. All of the medical conditions are reviewed and updated.  Lab work is reviewed and will be ordered as medically necessary. There are no new problems reported with today's visit.   Past Medical History:  Diagnosis Date  . Arthritis    knees, shoulders, elbows  . Bladder cancer [facility])    urologist-  dr Junious Silk  . Diverticulosis of colon   . Fibromyalgia   . GERD (gastroesophageal reflux disease)   . History of blood transfusion    age 63   . History of bronchitis   . History of diverticulitis of colon   . History of gastric ulcer    due to aleve  . Hypertension   . Incomplete left bundle branch block    told had irregular heartbeat 12-28-18-19, no referral to cardiology made  . Lower urinary tract symptoms (LUTS)   . OSA (obstructive sleep apnea)    NON-COMPLIANT  CPAP  --- BUT PT USES OXYGEN AT NIGHT 2.5L VIA [place] (PT'S DECISION)  . PONV (postoperative nausea and vomiting)   . Psoriasis    occ outbreaks on eyebrows chin and head, none recent  . Tinnitus    right ear more, has tranmitter in right ear removable at hs  . Urinary frequency   . Wears glasses     . Wears partial dentures    Relevant past medical, surgical, family and social history reviewed and updated as indicated. Interim medical history since our last visit reviewed. Allergies and medications reviewed and updated. DATA REVIEWED: CHART IN EPIC  Family History reviewed for pertinent findings.  Review of Systems  Constitutional: Negative.  Negative for appetite change and fatigue.  HENT: Negative.   Eyes: Negative.  Negative for pain and visual disturbance.  Respiratory: Negative.  Negative for cough, chest tightness, shortness of breath and wheezing.   Cardiovascular: Negative.  Negative for chest pain, palpitations and leg swelling.  Gastrointestinal: Negative.  Negative for abdominal pain, diarrhea, nausea and vomiting.  Endocrine: Negative.   Genitourinary: Negative.   Musculoskeletal: Negative.   Skin: Negative.  Negative for color change and rash.  Neurological: Negative.  Negative for weakness, numbness and headaches.  Psychiatric/Behavioral: Negative.     Allergies as of 07/06/2018   No Known Allergies     Medication List       Accurate as of July 06, 2018 11:59 PM. Always use your most recent med list.        diclofenac 75 MG EC tablet  Commonly known as:  VOLTAREN TAKE 1 TABLET (75 MG TOTAL) BY MOUTH 2 (TWO) TIMES DAILY.   gabapentin 100 MG capsule Commonly known as:  NEURONTIN Take 1 capsule (100 mg total) by mouth 3 (three) times daily.   lisinopril-hydrochlorothiazide 20-12.5 MG tablet Commonly known as:  PRINZIDE,ZESTORETIC Take 2 tablets by mouth daily.   meclizine 25 MG tablet Commonly known as:  ANTIVERT Take 25 mg by mouth 3 (three) times daily as needed for dizziness.   STOOL SOFTENER PO Take by mouth.          Objective:    BP (!) 142/87   Pulse 82   Temp (!) 97.2 F (36.2 C) (Oral)   Ht '5\' 6"'$  (1.676 m)   Wt 239 lb 6 oz (108.6 kg)   BMI 38.64 kg/m   No Known Allergies  Wt Readings from Last 3 Encounters:  07/06/18 239  lb 6 oz (108.6 kg)  05/11/18 230 lb (104.3 kg)  03/01/18 233 lb (105.7 kg)    Physical Exam Vitals signs and nursing note reviewed.  Constitutional:      General: He is not in acute distress.    Appearance: He is well-developed.  HENT:     Head: Normocephalic and atraumatic.  Eyes:     Conjunctiva/sclera: Conjunctivae normal.     Pupils: Pupils are equal, round, and reactive to light.  Cardiovascular:     Rate and Rhythm: Normal rate and regular rhythm.     Heart sounds: Normal heart sounds.  Pulmonary:     Effort: Pulmonary effort is normal. No respiratory distress.     Breath sounds: Normal breath sounds.  Abdominal:       Comments: Tender in left upper quadrant Lipomas in arm  Skin:    General: Skin is warm and dry.  Psychiatric:        Behavior: Behavior normal.     Results for orders placed or performed in visit on 02/10/18  CMP14+EGFR  Result Value Ref Range   Glucose 90 65 - 99 mg/dL   BUN 16 8 - 27 mg/dL   Creatinine, Ser 1.03 0.76 - 1.27 mg/dL   GFR calc non Af Amer 73 >59 mL/min/1.73   GFR calc Af Amer 85 >59 mL/min/1.73   BUN/Creatinine Ratio 16 10 - 24   Sodium 141 134 - 144 mmol/L   Potassium 4.4 3.5 - 5.2 mmol/L   Chloride 98 96 - 106 mmol/L   CO2 25 20 - 29 mmol/L   Calcium 9.9 8.6 - 10.2 mg/dL   Total Protein 6.8 6.0 - 8.5 g/dL   Albumin 4.5 3.5 - 4.8 g/dL   Globulin, Total 2.3 1.5 - 4.5 g/dL   Albumin/Globulin Ratio 2.0 1.2 - 2.2   Bilirubin Total 0.7 0.0 - 1.2 mg/dL   Alkaline Phosphatase 52 39 - 117 IU/L   AST 45 (H) 0 - 40 IU/L   ALT 29 0 - 44 IU/L  CBC with Differential/Platelet  Result Value Ref Range   WBC 6.1 3.4 - 10.8 x10E3/uL   RBC 4.79 4.14 - 5.80 x10E6/uL   Hemoglobin 14.7 13.0 - 17.7 g/dL   Hematocrit 43.3 37.5 - 51.0 %   MCV 90 79 - 97 fL   MCH 30.7 26.6 - 33.0 pg   MCHC 33.9 31.5 - 35.7 g/dL   RDW 13.1 12.3 - 15.4 %   Platelets 162 150 - 450 x10E3/uL   Neutrophils 58 Not Estab. %   Lymphs 27 Not Estab. %  Monocytes 10  Not Estab. %   Eos 4 Not Estab. %   Basos 0 Not Estab. %   Neutrophils Absolute 3.6 1.4 - 7.0 x10E3/uL   Lymphocytes Absolute 1.6 0.7 - 3.1 x10E3/uL   Monocytes Absolute 0.6 0.1 - 0.9 x10E3/uL   EOS (ABSOLUTE) 0.2 0.0 - 0.4 x10E3/uL   Basophils Absolute 0.0 0.0 - 0.2 x10E3/uL   Immature Granulocytes 1 Not Estab. %   Immature Grans (Abs) 0.0 0.0 - 0.1 x10E3/uL  Lipid panel  Result Value Ref Range   Cholesterol, Total 152 100 - 199 mg/dL   Triglycerides 179 (H) 0 - 149 mg/dL   HDL 25 (L) >39 mg/dL   VLDL Cholesterol Cal 36 5 - 40 mg/dL   LDL Calculated 91 0 - 99 mg/dL   Chol/HDL Ratio 6.1 (H) 0.0 - 5.0 ratio      Assessment & Plan:   1. Neuropathy - gabapentin (NEURONTIN) 100 MG capsule; Take 1 capsule (100 mg total) by mouth 3 (three) times daily.  Dispense: 90 capsule; Refill: 3  2. Lipoma of right upper extremity - Ambulatory referral to General Surgery   Continue all other maintenance medications as listed above.  Follow up plan: Return if symptoms worsen or fail to improve.  Educational handout given for [place] PA-C [facility] [address]  [place], [place] [address] [phone]   07/10/2018, 5:17 PM

## 2018-07-20 DIAGNOSIS — N401 Enlarged prostate with lower urinary tract symptoms: Secondary | ICD-10-CM | POA: Diagnosis not present

## 2018-07-20 DIAGNOSIS — D494 Neoplasm of unspecified behavior of bladder: Secondary | ICD-10-CM | POA: Diagnosis not present

## 2018-07-20 DIAGNOSIS — Z8551 Personal history of malignant neoplasm of bladder: Secondary | ICD-10-CM | POA: Diagnosis not present

## 2018-07-20 DIAGNOSIS — R350 Frequency of micturition: Secondary | ICD-10-CM | POA: Diagnosis not present

## 2018-08-10 ENCOUNTER — Ambulatory Visit: Payer: Medicare Other | Admitting: Physician Assistant

## 2018-08-10 DIAGNOSIS — D1721 Benign lipomatous neoplasm of skin and subcutaneous tissue of right arm: Secondary | ICD-10-CM | POA: Diagnosis not present

## 2018-08-12 ENCOUNTER — Other Ambulatory Visit: Payer: Self-pay

## 2018-08-12 DIAGNOSIS — C679 Malignant neoplasm of bladder, unspecified: Secondary | ICD-10-CM | POA: Diagnosis not present

## 2018-08-12 DIAGNOSIS — C674 Malignant neoplasm of posterior wall of bladder: Secondary | ICD-10-CM | POA: Diagnosis not present

## 2018-08-12 DIAGNOSIS — N3289 Other specified disorders of bladder: Secondary | ICD-10-CM | POA: Diagnosis not present

## 2018-08-24 DIAGNOSIS — N390 Urinary tract infection, site not specified: Secondary | ICD-10-CM | POA: Diagnosis not present

## 2018-08-24 DIAGNOSIS — C674 Malignant neoplasm of posterior wall of bladder: Secondary | ICD-10-CM | POA: Diagnosis not present

## 2018-09-08 ENCOUNTER — Other Ambulatory Visit: Payer: Self-pay

## 2018-09-08 ENCOUNTER — Encounter: Payer: Self-pay | Admitting: Family Medicine

## 2018-09-08 ENCOUNTER — Ambulatory Visit (INDEPENDENT_AMBULATORY_CARE_PROVIDER_SITE_OTHER): Payer: Medicare Other | Admitting: Family Medicine

## 2018-09-08 DIAGNOSIS — R0600 Dyspnea, unspecified: Secondary | ICD-10-CM | POA: Diagnosis not present

## 2018-09-08 NOTE — Progress Notes (Signed)
Virtual Visit via telephone Note  I connected with Billy Rasmussen on 09/08/18 at 1051 by telephone and verified that I am speaking with the correct person using two identifiers. Billy Rasmussen is currently located at home and No other people are currently with her during visit. The provider, Fransisca Kaufmann Starlena Beil, MD is located in their office at time of visit.  Call ended at 1100  I discussed the limitations, risks, security and privacy concerns of performing an evaluation and management service by telephone and the availability of in person appointments. I also discussed with the patient that there may be a patient responsible charge related to this service. The patient expressed understanding and agreed to proceed.   History and Present Illness: Patient says that he is having trouble with his oxygen and certification but he says that medicare doesn't want to pay for oxygen at night.  He uses oxygen at 2.5L at night. Patient had a sleep study that diagnosed him with sleep apnea and he was intolerant of the CPAP and started on oxygen at nighttime.  He says his oxygen dropped down to 86% after surgery and they had to put oxygen on him. He has used [employer].  He says he was at a surgical procedure just within the past couple weeks and he desatted down into the low 80s and had to throw oxygen on him during the recovery phase  No diagnosis found.  Outpatient Encounter Medications as of 09/08/2018  Medication Sig  . diclofenac (VOLTAREN) 75 MG EC tablet TAKE 1 TABLET (75 MG TOTAL) BY MOUTH 2 (TWO) TIMES DAILY.  Marland Kitchen Docusate Calcium (STOOL SOFTENER PO) Take by mouth.  . gabapentin (NEURONTIN) 100 MG capsule Take 1 capsule (100 mg total) by mouth 3 (three) times daily.  Marland Kitchen lisinopril-hydrochlorothiazide (PRINZIDE,ZESTORETIC) 20-12.5 MG tablet Take 2 tablets by mouth daily.  . meclizine (ANTIVERT) 25 MG tablet Take 25 mg by mouth 3 (three) times daily as needed for dizziness.   No facility-administered  encounter medications on file as of 09/08/2018.     Review of Systems  Constitutional: Negative for chills and fever.  Respiratory: Positive for shortness of breath (Shortness of breath at night when sleeping). Negative for wheezing.   Cardiovascular: Negative for chest pain and leg swelling.  Musculoskeletal: Negative for back pain and gait problem.  Skin: Negative for rash.  Psychiatric/Behavioral: Positive for sleep disturbance.  All other systems reviewed and are negative.   Observations/Objective: Patient sounds comfortable on the phone and in no acute distress  Assessment and Plan: Problem List Items Addressed This Visit    None    Visit Diagnoses    Nocturnal dyspnea    -  Primary   Relevant Orders   Pulse oximetry, overnight      Overnight oxygen at 2.5 L nasal cannula Follow Up Instructions:  Follow-up as needed  Patient needs an order for oxygen, will do overnight pulse oximetry and try and go through the order on [employer].   I discussed the assessment and treatment plan with the patient. The patient was provided an opportunity to ask questions and all were answered. The patient agreed with the plan and demonstrated an understanding of the instructions.   The patient was advised to call back or seek an in-person evaluation if the symptoms worsen or if the condition fails to improve as anticipated.  The above assessment and management plan was discussed with the patient. The patient verbalized understanding of and has agreed to the management  plan. Patient is aware to call the clinic if symptoms persist or worsen. Patient is aware when to return to the clinic for a follow-up visit. Patient educated on when it is appropriate to go to the emergency department.    I provided 9 minutes of non-face-to-face time during this encounter.    Worthy Rancher, MD

## 2018-09-12 ENCOUNTER — Other Ambulatory Visit: Payer: Self-pay

## 2018-09-12 MED ORDER — LISINOPRIL-HYDROCHLOROTHIAZIDE 20-12.5 MG PO TABS: 1.0000 | ORAL_TABLET | Freq: Every day | ORAL | 1 refills | Status: DC

## 2018-09-12 MED ORDER — DICLOFENAC SODIUM 75 MG PO TBEC: 75.0000 mg | DELAYED_RELEASE_TABLET | Freq: Two times a day (BID) | ORAL | 0 refills | Status: DC

## 2018-09-19 DIAGNOSIS — C678 Malignant neoplasm of overlapping sites of bladder: Secondary | ICD-10-CM | POA: Diagnosis not present

## 2018-09-23 ENCOUNTER — Telehealth: Payer: Self-pay | Admitting: Oncology

## 2018-09-23 NOTE — Telephone Encounter (Signed)
New referral received from Dr. Junious Silk at [facility] for bladder cancer. Pt has been cld and scheduled to see Dr. Alen Blew on 5/1 at [place] to arrive 15 minutes early.

## 2018-09-30 ENCOUNTER — Inpatient Hospital Stay: Payer: Medicare Other | Attending: Oncology | Admitting: Oncology

## 2018-09-30 ENCOUNTER — Other Ambulatory Visit: Payer: Self-pay

## 2018-09-30 ENCOUNTER — Other Ambulatory Visit: Payer: Self-pay | Admitting: Oncology

## 2018-09-30 VITALS — BP 145/70 | HR 73 | Temp 98.2°F | Resp 17 | Ht 66.0 in | Wt 237.2 lb

## 2018-09-30 DIAGNOSIS — Z87891 Personal history of nicotine dependence: Secondary | ICD-10-CM

## 2018-09-30 DIAGNOSIS — N3090 Cystitis, unspecified without hematuria: Secondary | ICD-10-CM

## 2018-09-30 DIAGNOSIS — Z8249 Family history of ischemic heart disease and other diseases of the circulatory system: Secondary | ICD-10-CM

## 2018-09-30 DIAGNOSIS — C679 Malignant neoplasm of bladder, unspecified: Secondary | ICD-10-CM

## 2018-09-30 DIAGNOSIS — Z833 Family history of diabetes mellitus: Secondary | ICD-10-CM | POA: Diagnosis not present

## 2018-09-30 DIAGNOSIS — Z820 Family history of epilepsy and other diseases of the nervous system: Secondary | ICD-10-CM | POA: Diagnosis not present

## 2018-09-30 DIAGNOSIS — G4730 Sleep apnea, unspecified: Secondary | ICD-10-CM | POA: Diagnosis not present

## 2018-09-30 DIAGNOSIS — I10 Essential (primary) hypertension: Secondary | ICD-10-CM

## 2018-09-30 DIAGNOSIS — Z9221 Personal history of antineoplastic chemotherapy: Secondary | ICD-10-CM | POA: Diagnosis not present

## 2018-09-30 DIAGNOSIS — M1990 Unspecified osteoarthritis, unspecified site: Secondary | ICD-10-CM | POA: Diagnosis not present

## 2018-09-30 NOTE — Progress Notes (Signed)
Bladder - No Medical Intervention - Off Treatment.  Patient Characteristics: Pre-Cystectomy or Nonsurgical Candidate (Clinical Staging), cTa, cTis, cT1, cN0 Therapeutic Status: Pre-Cystectomy or Nonsurgical Candidate (Clinical Staging) AJCC M Category: cM0 AJCC 8 Stage Grouping: 0a AJCC T Category: cTa AJCC N Category: cN0
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
## 2018-10-03 ENCOUNTER — Telehealth: Payer: Self-pay

## 2018-10-03 NOTE — Telephone Encounter (Signed)
-----   Message from Wyatt Portela, MD sent at 10/03/2018 11:15 AM EDT ----- Contact: [phone] I will call him in on 5/5.  ----- Message ----- From: Tami Lin, RN Sent: 10/03/2018  10:36 AM EDT To: Wyatt Portela, MD  Patient said he has 8-10 more question for you about having a cystectomy. He said he came up with more questions after his visit. He wants to know if he can set up a time to discuss with you over the phone.

## 2018-10-03 NOTE — Telephone Encounter (Signed)
Called and spoke to patient's wife at the home number listed.  Patient is not available but his wife stated she will inform patient that Dr. Alen Blew will call tomorrow.

## 2018-10-06 DIAGNOSIS — R3915 Urgency of urination: Secondary | ICD-10-CM | POA: Diagnosis not present

## 2018-10-06 DIAGNOSIS — C678 Malignant neoplasm of overlapping sites of bladder: Secondary | ICD-10-CM | POA: Diagnosis not present

## 2018-10-07 ENCOUNTER — Other Ambulatory Visit: Payer: Self-pay | Admitting: Physician Assistant

## 2018-10-07 ENCOUNTER — Encounter: Payer: Self-pay | Admitting: Family Medicine

## 2018-10-07 DIAGNOSIS — R0902 Hypoxemia: Secondary | ICD-10-CM

## 2018-10-07 DIAGNOSIS — R0600 Dyspnea, unspecified: Secondary | ICD-10-CM

## 2018-10-10 DIAGNOSIS — R0600 Dyspnea, unspecified: Secondary | ICD-10-CM | POA: Diagnosis not present

## 2018-10-13 DIAGNOSIS — R3129 Other microscopic hematuria: Secondary | ICD-10-CM | POA: Diagnosis not present

## 2018-10-13 DIAGNOSIS — C674 Malignant neoplasm of posterior wall of bladder: Secondary | ICD-10-CM | POA: Diagnosis not present

## 2018-10-18 DIAGNOSIS — C678 Malignant neoplasm of overlapping sites of bladder: Secondary | ICD-10-CM | POA: Diagnosis not present

## 2018-10-18 DIAGNOSIS — R350 Frequency of micturition: Secondary | ICD-10-CM | POA: Diagnosis not present

## 2018-10-19 ENCOUNTER — Other Ambulatory Visit: Payer: Self-pay | Admitting: Urology

## 2018-11-07 ENCOUNTER — Other Ambulatory Visit (HOSPITAL_COMMUNITY)
Admission: RE | Admit: 2018-11-07 | Discharge: 2018-11-07 | Disposition: A | Discharge status: Home / Self Care | Payer: Medicare Other | Source: Ambulatory Visit | Attending: Urology | Admitting: Urology

## 2018-11-07 ENCOUNTER — Other Ambulatory Visit: Payer: Self-pay

## 2018-11-07 ENCOUNTER — Telehealth: Payer: Self-pay | Admitting: Physician Assistant

## 2018-11-07 DIAGNOSIS — Z1159 Encounter for screening for other viral diseases: Secondary | ICD-10-CM | POA: Insufficient documentation

## 2018-11-07 LAB — SARS CORONAVIRUS 2 BY RT PCR (HOSPITAL ORDER, PERFORMED IN ~~LOC~~ HOSPITAL LAB): SARS Coronavirus 2: NEGATIVE

## 2018-11-07 MED ORDER — LISINOPRIL-HYDROCHLOROTHIAZIDE 20-12.5 MG PO TABS: 1.0000 | ORAL_TABLET | Freq: Every day | ORAL | 1 refills | Status: DC

## 2018-11-07 NOTE — Telephone Encounter (Signed)
Pt is scheduled for major surgery for Wednesday, pt is going in tomorrow sometime for it, pt states that he has misplaced his lisinopril-hydrochlorothiazide (PRINZIDE,ZESTORETIC) 20-12.5 MG tablet and can't find it , can we please send in 30 day supply to [place], please call pt and leave message on home phone if able to

## 2018-11-07 NOTE — Telephone Encounter (Signed)
Sent and pt aware

## 2018-11-08 ENCOUNTER — Encounter (HOSPITAL_COMMUNITY): Payer: Self-pay | Admitting: *Deleted

## 2018-11-08 ENCOUNTER — Inpatient Hospital Stay (HOSPITAL_COMMUNITY)
Admission: RE | Admit: 2018-11-08 | Discharge: 2018-11-16 | DRG: 654 | Disposition: A | Discharge status: Home Health | Payer: Medicare Other | Attending: Urology | Admitting: Urology

## 2018-11-08 ENCOUNTER — Other Ambulatory Visit: Payer: Self-pay

## 2018-11-08 DIAGNOSIS — C678 Malignant neoplasm of overlapping sites of bladder: Secondary | ICD-10-CM | POA: Diagnosis not present

## 2018-11-08 DIAGNOSIS — D62 Acute posthemorrhagic anemia: Secondary | ICD-10-CM | POA: Diagnosis not present

## 2018-11-08 DIAGNOSIS — D090 Carcinoma in situ of bladder: Secondary | ICD-10-CM | POA: Diagnosis present

## 2018-11-08 DIAGNOSIS — Z96612 Presence of left artificial shoulder joint: Secondary | ICD-10-CM | POA: Diagnosis present

## 2018-11-08 DIAGNOSIS — Z96651 Presence of right artificial knee joint: Secondary | ICD-10-CM | POA: Diagnosis present

## 2018-11-08 DIAGNOSIS — Z87891 Personal history of nicotine dependence: Secondary | ICD-10-CM | POA: Diagnosis not present

## 2018-11-08 DIAGNOSIS — R52 Pain, unspecified: Secondary | ICD-10-CM

## 2018-11-08 DIAGNOSIS — Z833 Family history of diabetes mellitus: Secondary | ICD-10-CM | POA: Diagnosis not present

## 2018-11-08 DIAGNOSIS — Z8249 Family history of ischemic heart disease and other diseases of the circulatory system: Secondary | ICD-10-CM | POA: Diagnosis not present

## 2018-11-08 DIAGNOSIS — K660 Peritoneal adhesions (postprocedural) (postinfection): Secondary | ICD-10-CM | POA: Diagnosis present

## 2018-11-08 DIAGNOSIS — Z96611 Presence of right artificial shoulder joint: Secondary | ICD-10-CM | POA: Diagnosis present

## 2018-11-08 DIAGNOSIS — C679 Malignant neoplasm of bladder, unspecified: Secondary | ICD-10-CM | POA: Diagnosis present

## 2018-11-08 DIAGNOSIS — M25572 Pain in left ankle and joints of left foot: Secondary | ICD-10-CM | POA: Diagnosis not present

## 2018-11-08 DIAGNOSIS — N5089 Other specified disorders of the male genital organs: Secondary | ICD-10-CM | POA: Diagnosis not present

## 2018-11-08 DIAGNOSIS — E7800 Pure hypercholesterolemia, unspecified: Secondary | ICD-10-CM | POA: Diagnosis not present

## 2018-11-08 DIAGNOSIS — I10 Essential (primary) hypertension: Secondary | ICD-10-CM | POA: Diagnosis present

## 2018-11-08 DIAGNOSIS — Z1159 Encounter for screening for other viral diseases: Secondary | ICD-10-CM | POA: Diagnosis not present

## 2018-11-08 DIAGNOSIS — D075 Carcinoma in situ of prostate: Secondary | ICD-10-CM | POA: Diagnosis not present

## 2018-11-08 DIAGNOSIS — N3001 Acute cystitis with hematuria: Secondary | ICD-10-CM | POA: Diagnosis not present

## 2018-11-08 DIAGNOSIS — K567 Ileus, unspecified: Secondary | ICD-10-CM | POA: Diagnosis not present

## 2018-11-08 LAB — COMPREHENSIVE METABOLIC PANEL
ALT: 50 U/L — ABNORMAL HIGH (ref 0–44)
AST: 53 U/L — ABNORMAL HIGH (ref 15–41)
Albumin: 4.2 g/dL (ref 3.5–5.0)
Alkaline Phosphatase: 46 U/L (ref 38–126)
Anion gap: 6 (ref 5–15)
BUN: 12 mg/dL (ref 8–23)
CO2: 27 mmol/L (ref 22–32)
Calcium: 9.1 mg/dL (ref 8.9–10.3)
Chloride: 104 mmol/L (ref 98–111)
Creatinine, Ser: 0.79 mg/dL (ref 0.61–1.24)
GFR calc Af Amer: >60 mL/min (ref >60)
GFR calc non Af Amer: >60 mL/min (ref >60)
Glucose, Bld: 92 mg/dL (ref 70–99)
Potassium: 3.6 mmol/L (ref 3.5–5.1)
Sodium: 137 mmol/L (ref 135–145)
Total Bilirubin: 0.8 mg/dL (ref 0.3–1.2)
Total Protein: 7.2 g/dL (ref 6.5–8.1)

## 2018-11-08 LAB — CBC
HCT: 40.5 % (ref 39.0–52.0)
Hemoglobin: 14.3 g/dL (ref 13.0–17.0)
MCH: 32.6 pg (ref 26.0–34.0)
MCHC: 35.3 g/dL (ref 30.0–36.0)
MCV: 92.3 fL (ref 80.0–100.0)
Platelets: 128 10*3/uL — ABNORMAL LOW (ref 150–400)
RBC: 4.39 MIL/uL (ref 4.22–5.81)
RDW: 13 % (ref 11.5–15.5)
WBC: 6.5 10*3/uL (ref 4.0–10.5)
nRBC: 0 % (ref 0.0–0.2)

## 2018-11-08 LAB — MRSA PCR SCREENING: MRSA by PCR: NEGATIVE

## 2018-11-08 LAB — PREPARE RBC (CROSSMATCH)

## 2018-11-08 MED ORDER — LISINOPRIL-HYDROCHLOROTHIAZIDE 20-12.5 MG PO TABS: 1.0000 | ORAL_TABLET | Freq: Every day | ORAL | Status: DC

## 2018-11-08 MED ORDER — ACETAMINOPHEN 500 MG PO TABS
1000.0000 mg | ORAL_TABLET | Freq: Four times a day (QID) | ORAL | Status: DC | PRN
  Administered 2018-11-08: 1000 mg via ORAL
  Filled 2018-11-08: qty 2

## 2018-11-08 MED ORDER — HYDROCHLOROTHIAZIDE 12.5 MG PO CAPS
12.5000 mg | ORAL_CAPSULE | Freq: Every day | ORAL | Status: DC
  Administered 2018-11-08: 12.5 mg via ORAL
  Filled 2018-11-08 (×2): qty 1

## 2018-11-08 MED ORDER — PIPERACILLIN-TAZOBACTAM 3.375 G IVPB 30 MIN
3.3750 g | INTRAVENOUS | Status: AC
  Administered 2018-11-09: 3.375 g via INTRAVENOUS
  Filled 2018-11-08 (×2): qty 50

## 2018-11-08 MED ORDER — MECLIZINE HCL 25 MG PO TABS
25.0000 mg | ORAL_TABLET | Freq: Three times a day (TID) | ORAL | Status: DC | PRN
  Filled 2018-11-08: qty 1

## 2018-11-08 MED ORDER — NEOMYCIN SULFATE 500 MG PO TABS
500.0000 mg | ORAL_TABLET | Freq: Four times a day (QID) | ORAL | Status: AC
  Administered 2018-11-08 – 2018-11-09 (×3): 500 mg via ORAL
  Filled 2018-11-08 (×3): qty 1

## 2018-11-08 MED ORDER — PEG 3350-KCL-NA BICARB-NACL 420 G PO SOLR
4000.0000 mL | Freq: Once | ORAL | Status: AC
  Administered 2018-11-08: 4000 mL via ORAL

## 2018-11-08 MED ORDER — SODIUM CHLORIDE 0.9 % IV SOLN
INTRAVENOUS | Status: DC
  Administered 2018-11-08: 17:00:00 via INTRAVENOUS

## 2018-11-08 MED ORDER — LISINOPRIL 10 MG PO TABS
20.0000 mg | ORAL_TABLET | Freq: Every day | ORAL | Status: DC
  Administered 2018-11-08: 20 mg via ORAL
  Filled 2018-11-08: qty 1
  Filled 2018-11-08: qty 2

## 2018-11-08 MED ORDER — METRONIDAZOLE 500 MG PO TABS
500.0000 mg | ORAL_TABLET | Freq: Four times a day (QID) | ORAL | Status: AC
  Administered 2018-11-08 – 2018-11-09 (×3): 500 mg via ORAL
  Filled 2018-11-08 (×3): qty 1

## 2018-11-08 NOTE — Discharge Instructions (Signed)
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
## 2018-11-08 NOTE — Anesthesia Preprocedure Evaluation (Addendum)
Anesthesia Evaluation  Patient identified by MRN, date of birth, ID band Patient awake    Reviewed: Allergy & Precautions, NPO status , Patient's Chart, lab work & pertinent test results  History of Anesthesia Complications (+) PONV  Airway Mallampati: II  TM Distance: >3 FB Neck ROM: Full    Dental no notable dental hx.    Pulmonary sleep apnea and Oxygen sleep apnea , former smoker,    Pulmonary exam normal breath sounds clear to auscultation       Cardiovascular hypertension, Normal cardiovascular exam Rhythm:Regular Rate:Normal     Neuro/Psych negative neurological ROS  negative psych ROS   GI/Hepatic negative GI ROS, Neg liver ROS,   Endo/Other  negative endocrine ROS  Renal/GU negative Renal ROS  negative genitourinary   Musculoskeletal negative musculoskeletal ROS (+)   Abdominal   Peds negative pediatric ROS (+)  Hematology negative hematology ROS (+)   Anesthesia Other Findings   Reproductive/Obstetrics negative OB ROS                            Anesthesia Physical Anesthesia Plan  ASA: III  Anesthesia Plan: General   Post-op Pain Management:    Induction: Intravenous  PONV Risk Score and Plan: Ondansetron, Dexamethasone, Treatment may vary due to age or medical condition and Scopolamine patch - Pre-op  Airway Management Planned: Oral ETT  Additional Equipment: Arterial line  Intra-op Plan:   Post-operative Plan: Extubation in OR  Informed Consent: I have reviewed the patients History and Physical, chart, labs and discussed the procedure including the risks, benefits and alternatives for the proposed anesthesia with the patient or authorized representative who has indicated his/her understanding and acceptance.     Dental advisory given  Plan Discussed with: CRNA and Surgeon  Anesthesia Plan Comments:         Anesthesia Quick Evaluation

## 2018-11-08 NOTE — Consult Note (Signed)
[facility] Nurse ostomy consult note  [facility] Nurse requested for preoperative stoma site marking by Dr. Clint Lipps.  Discussed surgical procedure and stoma creation with patient.  Explained role of the [facility] nurse team. Answered patient questions pertaining to marking. He had a colostomy in the past and reported several instances of pouch leakages and skin irritation (contact dermatitis). He is reassured that we will seek a good fit with the proper barrier formulation prior to discharge. It is noted that a having a HHRN will be beneficial to his gaining confidence post discharge. 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 He is positive energy and hopefulness about his surgery tomorrow. He requests that I keep him in my prayers during the surgery tomorrow morning.  Examined patient lying, sitting, and standing in order to place the marking in the patient's visual field, away from any creases or abdominal contour issues and within the rectus muscle. Patient wears the waistband of his slacks and underwear quite low on the abdomen. Slight irregularities in the abdominal contours noted.  A colostomy takedown scar is located in the LLQ.  Marked for ileal conduit in the RUQ  7cm to the right of the umbilicus and  1.1SR above the umbilicus.  Patient's abdomen cleansed with CHG wipes at site markings, allowed to air dry prior to marking.Covered mark with thin film transparent dressing to preserve mark until date of surgery (tomorrow, 11/09/18).   [facility] Nursing team will follow up with patient after surgery for ostomy care and teaching.   Thank you for inviting [place] to be a part of this nice gentleman's care team. We look forward to working with him.  Maudie Flakes, MSN, RN, [place], [facility]  Pager# [phone]

## 2018-11-08 NOTE — Progress Notes (Signed)
Patient arrived to unit. Pt is stable, see flow sheet VS. IV inserted. On  11/07/18 SARS Negative result. Will not swab again.

## 2018-11-09 ENCOUNTER — Encounter (HOSPITAL_COMMUNITY): Payer: Self-pay

## 2018-11-09 ENCOUNTER — Inpatient Hospital Stay (HOSPITAL_COMMUNITY): Payer: Medicare Other | Admitting: Anesthesiology

## 2018-11-09 ENCOUNTER — Encounter (HOSPITAL_COMMUNITY)
Admission: RE | Disposition: A | Discharge status: Home Health | Payer: Self-pay | Source: Home / Self Care | Attending: Urology

## 2018-11-09 DIAGNOSIS — C679 Malignant neoplasm of bladder, unspecified: Secondary | ICD-10-CM | POA: Diagnosis present

## 2018-11-09 HISTORY — PX: CYSTOSCOPY WITH INJECTION: SHX1424

## 2018-11-09 LAB — HEMOGLOBIN AND HEMATOCRIT, BLOOD
HCT: 39.8 % (ref 39.0–52.0)
Hemoglobin: 13.5 g/dL (ref 13.0–17.0)

## 2018-11-09 SURGERY — ROBOT ASSISTED LAPAROSCOPIC RADICAL CYSTOPROSTATECTOMY BILATERAL PELVIC LYMPHADENECTOMY,ORTHOTOPIC NEOBLADDER
Anesthesia: General

## 2018-11-09 MED ORDER — PHENYLEPHRINE 40 MCG/ML (10ML) SYRINGE FOR IV PUSH (FOR BLOOD PRESSURE SUPPORT)
PREFILLED_SYRINGE | INTRAVENOUS | Status: AC
  Filled 2018-11-09: qty 10

## 2018-11-09 MED ORDER — OXYCODONE HCL 5 MG PO TABS
5.0000 mg | ORAL_TABLET | ORAL | Status: DC | PRN
  Administered 2018-11-11 – 2018-11-15 (×8): 5 mg via ORAL
  Filled 2018-11-09 (×10): qty 1

## 2018-11-09 MED ORDER — INDOCYANINE GREEN 25 MG IV SOLR
INTRAVENOUS | Status: DC | PRN
  Administered 2018-11-09: 5 mg

## 2018-11-09 MED ORDER — LIDOCAINE 2% (20 MG/ML) 5 ML SYRINGE
INTRAMUSCULAR | Status: DC | PRN
  Administered 2018-11-09: 80 mg via INTRAVENOUS

## 2018-11-09 MED ORDER — ACETAMINOPHEN 10 MG/ML IV SOLN
1000.0000 mg | Freq: Four times a day (QID) | INTRAVENOUS | Status: AC
  Administered 2018-11-09 – 2018-11-10 (×4): 1000 mg via INTRAVENOUS
  Filled 2018-11-09 (×4): qty 100

## 2018-11-09 MED ORDER — DIPHENHYDRAMINE HCL 12.5 MG/5ML PO ELIX: 12.5000 mg | ORAL_SOLUTION | Freq: Four times a day (QID) | ORAL | Status: DC | PRN

## 2018-11-09 MED ORDER — OXYCODONE HCL 5 MG/5ML PO SOLN: 5.0000 mg | Freq: Once | ORAL | Status: DC | PRN

## 2018-11-09 MED ORDER — KETAMINE HCL 10 MG/ML IJ SOLN
INTRAMUSCULAR | Status: DC | PRN
  Administered 2018-11-09 (×5): 10 mg via INTRAVENOUS
  Administered 2018-11-09: 30 mg via INTRAVENOUS

## 2018-11-09 MED ORDER — ROCURONIUM BROMIDE 10 MG/ML (PF) SYRINGE
PREFILLED_SYRINGE | INTRAVENOUS | Status: AC
  Filled 2018-11-09: qty 10

## 2018-11-09 MED ORDER — ONDANSETRON HCL 4 MG/2ML IJ SOLN
INTRAMUSCULAR | Status: DC | PRN
  Administered 2018-11-09 (×2): 4 mg via INTRAVENOUS

## 2018-11-09 MED ORDER — DIPHENHYDRAMINE HCL 50 MG/ML IJ SOLN: 12.5000 mg | Freq: Four times a day (QID) | INTRAMUSCULAR | Status: DC | PRN

## 2018-11-09 MED ORDER — LIDOCAINE 2% (20 MG/ML) 5 ML SYRINGE
INTRAMUSCULAR | Status: AC
  Filled 2018-11-09: qty 5

## 2018-11-09 MED ORDER — FENTANYL CITRATE (PF) 250 MCG/5ML IJ SOLN
INTRAMUSCULAR | Status: AC
  Filled 2018-11-09: qty 5

## 2018-11-09 MED ORDER — ORAL CARE MOUTH RINSE
15.0000 mL | Freq: Two times a day (BID) | OROMUCOSAL | Status: DC
  Administered 2018-11-10 – 2018-11-12 (×3): 15 mL via OROMUCOSAL

## 2018-11-09 MED ORDER — SUGAMMADEX SODIUM 500 MG/5ML IV SOLN
INTRAVENOUS | Status: AC
  Filled 2018-11-09: qty 5

## 2018-11-09 MED ORDER — LIDOCAINE 2% (20 MG/ML) 5 ML SYRINGE
INTRAMUSCULAR | Status: DC | PRN
  Administered 2018-11-09: 1.5 mg/kg/h via INTRAVENOUS

## 2018-11-09 MED ORDER — PHENYLEPHRINE 40 MCG/ML (10ML) SYRINGE FOR IV PUSH (FOR BLOOD PRESSURE SUPPORT)
PREFILLED_SYRINGE | INTRAVENOUS | Status: DC | PRN
  Administered 2018-11-09 (×4): 80 ug via INTRAVENOUS

## 2018-11-09 MED ORDER — MIDAZOLAM HCL 2 MG/2ML IJ SOLN
INTRAMUSCULAR | Status: AC
  Filled 2018-11-09: qty 2

## 2018-11-09 MED ORDER — DOCUSATE SODIUM 100 MG PO CAPS
100.0000 mg | ORAL_CAPSULE | Freq: Two times a day (BID) | ORAL | Status: DC
  Administered 2018-11-09 – 2018-11-15 (×13): 100 mg via ORAL
  Filled 2018-11-09 (×14): qty 1

## 2018-11-09 MED ORDER — BUPIVACAINE LIPOSOME 1.3 % IJ SUSP
20.0000 mL | Freq: Once | INTRAMUSCULAR | Status: AC
  Administered 2018-11-09: 20 mL
  Filled 2018-11-09: qty 20

## 2018-11-09 MED ORDER — SUGAMMADEX SODIUM 500 MG/5ML IV SOLN
INTRAVENOUS | Status: DC | PRN
  Administered 2018-11-09: 220 mg via INTRAVENOUS

## 2018-11-09 MED ORDER — ONDANSETRON HCL 4 MG/2ML IJ SOLN
4.0000 mg | INTRAMUSCULAR | Status: DC | PRN
  Administered 2018-11-10: 4 mg via INTRAVENOUS
  Filled 2018-11-09: qty 2

## 2018-11-09 MED ORDER — HYDROMORPHONE HCL 1 MG/ML IJ SOLN
0.2500 mg | INTRAMUSCULAR | Status: DC | PRN
  Administered 2018-11-09: 0.25 mg via INTRAVENOUS

## 2018-11-09 MED ORDER — ALVIMOPAN 12 MG PO CAPS
12.0000 mg | ORAL_CAPSULE | Freq: Two times a day (BID) | ORAL | Status: DC
  Filled 2018-11-09: qty 1

## 2018-11-09 MED ORDER — NALOXONE HCL 0.4 MG/ML IJ SOLN: 0.4000 mg | INTRAMUSCULAR | Status: DC | PRN

## 2018-11-09 MED ORDER — LIDOCAINE 2% (20 MG/ML) 5 ML SYRINGE
INTRAMUSCULAR | Status: AC
  Filled 2018-11-09: qty 15

## 2018-11-09 MED ORDER — FENTANYL CITRATE (PF) 100 MCG/2ML IJ SOLN
INTRAMUSCULAR | Status: AC
  Filled 2018-11-09: qty 2

## 2018-11-09 MED ORDER — LIDOCAINE 2% (20 MG/ML) 5 ML SYRINGE
INTRAMUSCULAR | Status: AC
  Filled 2018-11-09: qty 10

## 2018-11-09 MED ORDER — PROPOFOL 10 MG/ML IV BOLUS
INTRAVENOUS | Status: AC
  Filled 2018-11-09: qty 20

## 2018-11-09 MED ORDER — OXYCODONE HCL 5 MG PO TABS: 5.0000 mg | ORAL_TABLET | Freq: Once | ORAL | Status: DC | PRN

## 2018-11-09 MED ORDER — MIDAZOLAM HCL 5 MG/5ML IJ SOLN
INTRAMUSCULAR | Status: DC | PRN
  Administered 2018-11-09 (×2): 1 mg via INTRAVENOUS

## 2018-11-09 MED ORDER — SODIUM CHLORIDE (PF) 0.9 % IJ SOLN
INTRAMUSCULAR | Status: DC | PRN
  Administered 2018-11-09: 20 mL

## 2018-11-09 MED ORDER — SODIUM CHLORIDE (PF) 0.9 % IJ SOLN
INTRAMUSCULAR | Status: AC
  Filled 2018-11-09: qty 20

## 2018-11-09 MED ORDER — PIPERACILLIN-TAZOBACTAM 3.375 G IVPB
3.3750 g | Freq: Three times a day (TID) | INTRAVENOUS | Status: AC
  Administered 2018-11-09 – 2018-11-11 (×6): 3.375 g via INTRAVENOUS
  Filled 2018-11-09 (×6): qty 50

## 2018-11-09 MED ORDER — PROPOFOL 10 MG/ML IV BOLUS
INTRAVENOUS | Status: DC | PRN
  Administered 2018-11-09: 150 mg via INTRAVENOUS

## 2018-11-09 MED ORDER — KETAMINE HCL 10 MG/ML IJ SOLN
INTRAMUSCULAR | Status: AC
  Filled 2018-11-09: qty 1

## 2018-11-09 MED ORDER — FENTANYL CITRATE (PF) 100 MCG/2ML IJ SOLN
INTRAMUSCULAR | Status: DC | PRN
  Administered 2018-11-09: 50 ug via INTRAVENOUS
  Administered 2018-11-09: 100 ug via INTRAVENOUS
  Administered 2018-11-09 (×3): 50 ug via INTRAVENOUS
  Administered 2018-11-09: 100 ug via INTRAVENOUS

## 2018-11-09 MED ORDER — CHLORHEXIDINE GLUCONATE 0.12 % MT SOLN
15.0000 mL | Freq: Two times a day (BID) | OROMUCOSAL | Status: DC
  Administered 2018-11-09 – 2018-11-16 (×13): 15 mL via OROMUCOSAL
  Filled 2018-11-09 (×13): qty 15

## 2018-11-09 MED ORDER — HYDROMORPHONE HCL 1 MG/ML IJ SOLN
INTRAMUSCULAR | Status: AC
  Filled 2018-11-09: qty 1

## 2018-11-09 MED ORDER — HYDROMORPHONE 1 MG/ML IV SOLN
INTRAVENOUS | Status: DC
  Administered 2018-11-09: 30 mg via INTRAVENOUS
  Administered 2018-11-09: 1.2 mg via INTRAVENOUS
  Administered 2018-11-09 (×2): 0.6 mg via INTRAVENOUS
  Administered 2018-11-10: 0.3 mg via INTRAVENOUS
  Administered 2018-11-10: 1.2 mg via INTRAVENOUS
  Administered 2018-11-10: 0.9 mg via INTRAVENOUS
  Administered 2018-11-10: 0.6 mg via INTRAVENOUS
  Administered 2018-11-11: 1.2 mg via INTRAVENOUS
  Administered 2018-11-11 (×2): 0.6 mg via INTRAVENOUS
  Administered 2018-11-12: 0 mg via INTRAVENOUS
  Administered 2018-11-12: 0.6 mg via INTRAVENOUS
  Administered 2018-11-12: 0.3 mg via INTRAVENOUS
  Administered 2018-11-12: 0.6 mg via INTRAVENOUS
  Administered 2018-11-12: 0.9 mg via INTRAVENOUS
  Administered 2018-11-12: 1.8 mg via INTRAVENOUS
  Administered 2018-11-13: 0.9 mg via INTRAVENOUS
  Administered 2018-11-13: 0.3 mg via INTRAVENOUS
  Administered 2018-11-13: 0.6 mg via INTRAVENOUS
  Filled 2018-11-09 (×10): qty 30

## 2018-11-09 MED ORDER — ROCURONIUM BROMIDE 10 MG/ML (PF) SYRINGE
PREFILLED_SYRINGE | INTRAVENOUS | Status: DC | PRN
  Administered 2018-11-09: 20 mg via INTRAVENOUS
  Administered 2018-11-09: 40 mg via INTRAVENOUS
  Administered 2018-11-09: 30 mg via INTRAVENOUS
  Administered 2018-11-09: 60 mg via INTRAVENOUS
  Administered 2018-11-09: 30 mg via INTRAVENOUS

## 2018-11-09 MED ORDER — LACTATED RINGERS IV SOLN
INTRAVENOUS | Status: DC
  Administered 2018-11-09 (×2): via INTRAVENOUS

## 2018-11-09 MED ORDER — DEXTROSE-NACL 5-0.45 % IV SOLN
INTRAVENOUS | Status: DC
  Administered 2018-11-09 – 2018-11-14 (×11): via INTRAVENOUS

## 2018-11-09 MED ORDER — WATER FOR IRRIGATION, STERILE IR SOLN
Status: DC | PRN
  Administered 2018-11-09: 3000 mL via INTRAVESICAL

## 2018-11-09 MED ORDER — SODIUM CHLORIDE 0.9% FLUSH: 9.0000 mL | INTRAVENOUS | Status: DC | PRN

## 2018-11-09 MED ORDER — PROMETHAZINE HCL 25 MG/ML IJ SOLN: 6.2500 mg | INTRAMUSCULAR | Status: DC | PRN

## 2018-11-09 MED ORDER — DEXAMETHASONE SODIUM PHOSPHATE 4 MG/ML IJ SOLN
INTRAMUSCULAR | Status: DC | PRN
  Administered 2018-11-09: 6 mg via INTRAVENOUS

## 2018-11-09 MED ORDER — LACTATED RINGERS IV SOLN
INTRAVENOUS | Status: DC | PRN
  Administered 2018-11-09: 08:00:00 via INTRAVENOUS

## 2018-11-09 SURGICAL SUPPLY — 88 items
APPLICATOR COTTON TIP 6 STRL (MISCELLANEOUS) ×1 IMPLANT
APPLICATOR COTTON TIP 6IN STRL (MISCELLANEOUS) ×2
APPLICATOR SURGIFLO ENDO (HEMOSTASIS) IMPLANT
BAG LAPAROSCOPIC 12 15 PORT 16 (BASKET) ×1 IMPLANT
BAG RETRIEVAL 12/15 (BASKET) ×2
BLADE HEX COATED 2.75 (ELECTRODE) ×2 IMPLANT
BLADE SURG SZ10 CARB STEEL (BLADE) IMPLANT
CATH SILICONE 5CC 18FR (INSTRUMENTS) ×2 IMPLANT
CELLS DAT CNTRL 66122 CELL SVR (MISCELLANEOUS) ×1 IMPLANT
CHLORAPREP W/TINT 26 (MISCELLANEOUS) ×2 IMPLANT
CLIP VESOLOCK LG 6/CT PURPLE (CLIP) ×8 IMPLANT
CLIP VESOLOCK MED LG 6/CT (CLIP) ×2 IMPLANT
CLIP VESOLOCK XL 6/CT (CLIP) ×4 IMPLANT
CONT SPEC 4OZ CLIKSEAL STRL BL (MISCELLANEOUS) ×2 IMPLANT
COVER SURGICAL LIGHT HANDLE (MISCELLANEOUS) ×2 IMPLANT
COVER TIP SHEARS 8 DVNC (MISCELLANEOUS) ×1 IMPLANT
COVER TIP SHEARS 8MM DA VINCI (MISCELLANEOUS) ×1
COVER WAND RF STERILE (DRAPES) IMPLANT
DECANTER SPIKE VIAL GLASS SM (MISCELLANEOUS) ×2 IMPLANT
DERMABOND ADVANCED (GAUZE/BANDAGES/DRESSINGS)
DERMABOND ADVANCED .7 DNX12 (GAUZE/BANDAGES/DRESSINGS) IMPLANT
DRAIN CHANNEL RND F F (WOUND CARE) ×2 IMPLANT
DRAIN PENROSE 18X1/2 LTX STRL (DRAIN) IMPLANT
DRAPE ARM DVNC X/XI (DISPOSABLE) ×4 IMPLANT
DRAPE COLUMN DVNC XI (DISPOSABLE) ×1 IMPLANT
DRAPE DA VINCI XI ARM (DISPOSABLE) ×4
DRAPE DA VINCI XI COLUMN (DISPOSABLE) ×1
ELECT REM PT RETURN 15FT ADLT (MISCELLANEOUS) ×2 IMPLANT
EVACUATOR SILICONE 100CC (DRAIN) ×2 IMPLANT
GAUZE 4X4 16PLY RFD (DISPOSABLE) IMPLANT
GLOVE BIO SURGEON STRL SZ 6.5 (GLOVE) ×8 IMPLANT
GLOVE BIOGEL M STRL SZ7.5 (GLOVE) ×8 IMPLANT
GLOVE BIOGEL PI IND STRL 7.0 (GLOVE) ×3 IMPLANT
GLOVE BIOGEL PI INDICATOR 7.0 (GLOVE) ×3
GOWN STRL REUS W/TWL LRG LVL3 (GOWN DISPOSABLE) ×10 IMPLANT
IRRIG SUCT STRYKERFLOW 2 WTIP (MISCELLANEOUS) ×2
IRRIGATION SUCT STRKRFLW 2 WTP (MISCELLANEOUS) ×1 IMPLANT
IV LACTATED RINGERS 1000ML (IV SOLUTION) ×2 IMPLANT
KIT PROCEDURE DA VINCI SI (MISCELLANEOUS) ×1
KIT PROCEDURE DVNC SI (MISCELLANEOUS) ×1 IMPLANT
KIT TURNOVER KIT A (KITS) ×2 IMPLANT
LOOP VESSEL MAXI BLUE (MISCELLANEOUS) ×2 IMPLANT
NEEDLE INSUFFLATION 14GA 120MM (NEEDLE) ×2 IMPLANT
PACK ROBOT UROLOGY CUSTOM (CUSTOM PROCEDURE TRAY) ×2 IMPLANT
PAD POSITIONING PINK XL (MISCELLANEOUS) ×2 IMPLANT
PORT ACCESS TROCAR AIRSEAL 12 (TROCAR) IMPLANT
PORT ACCESS TROCAR AIRSEAL 5M (TROCAR)
PROTECTOR NERVE ULNAR (MISCELLANEOUS) ×2 IMPLANT
RELOAD STAPLER GREEN 60MM (STAPLE) ×5 IMPLANT
RELOAD STAPLER WHITE 60MM (STAPLE) ×10 IMPLANT
RETRACTOR LONRSTAR 16.6X16.6CM (MISCELLANEOUS) IMPLANT
RETRACTOR STAY HOOK 5MM (MISCELLANEOUS) IMPLANT
RETRACTOR STER APS 16.6X16.6CM (MISCELLANEOUS)
RTRCTR WOUND ALEXIS 18CM MED (MISCELLANEOUS) ×2
SCISSORS LAP 5X45 EPIX DISP (ENDOMECHANICALS) ×2 IMPLANT
SEAL CANN UNIV 5-8 DVNC XI (MISCELLANEOUS) ×4 IMPLANT
SEAL XI 5MM-8MM UNIVERSAL (MISCELLANEOUS) ×4
SET TRI-LUMEN FLTR TB AIRSEAL (TUBING) IMPLANT
SOLUTION ELECTROLUBE (MISCELLANEOUS) ×2 IMPLANT
SPONGE LAP 18X18 RF (DISPOSABLE) ×4 IMPLANT
SPONGE LAP 4X18 RFD (DISPOSABLE) ×2 IMPLANT
STAPLER ECHELON LONG 60 440 (INSTRUMENTS) ×2 IMPLANT
STAPLER RELOAD GREEN 60MM (STAPLE) ×10
STAPLER RELOAD WHITE 60MM (STAPLE) ×20
STENT SET URETHERAL LEFT 7FR (STENTS) ×2 IMPLANT
STENT SET URETHERAL RIGHT 7FR (STENTS) ×2 IMPLANT
SURGIFLO W/THROMBIN 8M KIT (HEMOSTASIS) IMPLANT
SUT CHROMIC 4 0 RB 1X27 (SUTURE) ×2 IMPLANT
SUT ETHILON 3 0 PS 1 (SUTURE) ×2 IMPLANT
SUT MNCRL AB 4-0 PS2 18 (SUTURE) ×4 IMPLANT
SUT NOVA NAB DX-16 0-1 5-0 T12 (SUTURE) ×4 IMPLANT
SUT PDS AB 0 CTX 36 PDP370T (SUTURE) ×6 IMPLANT
SUT SILK 3 0 SH 30 (SUTURE) IMPLANT
SUT SILK 3 0 SH CR/8 (SUTURE) ×2 IMPLANT
SUT VIC AB 2-0 SH 18 (SUTURE) IMPLANT
SUT VIC AB 2-0 UR5 27 (SUTURE) ×10 IMPLANT
SUT VIC AB 3-0 SH 27 (SUTURE) ×4
SUT VIC AB 3-0 SH 27X BRD (SUTURE) ×2 IMPLANT
SUT VIC AB 3-0 SH 27XBRD (SUTURE) ×2 IMPLANT
SUT VIC AB 4-0 RB1 27 (SUTURE) ×6
SUT VIC AB 4-0 RB1 27XBRD (SUTURE) ×6 IMPLANT
SUT VLOC BARB 180 ABS3/0GR12 (SUTURE) ×2
SUTURE VLOC BRB 180 ABS3/0GR12 (SUTURE) ×1 IMPLANT
SYSTEM UROSTOMY GENTLE TOUCH (WOUND CARE) ×2 IMPLANT
TOWEL OR NON WOVEN STRL DISP B (DISPOSABLE) ×2 IMPLANT
TROCAR BLADELESS 15MM (ENDOMECHANICALS) ×2 IMPLANT
WATER STERILE IRR 1000ML POUR (IV SOLUTION) ×2 IMPLANT
YANKAUER SUCT BULB TIP 10FT TU (MISCELLANEOUS) ×2 IMPLANT

## 2018-11-09 NOTE — Anesthesia Procedure Notes (Signed)
Procedure Name: Intubation Date/Time: 11/09/2018 8:29 AM Performed by: Claudia Desanctis, CRNA Pre-anesthesia Checklist: Patient identified, Emergency Drugs available, Suction available and Patient being monitored Patient Re-evaluated:Patient Re-evaluated prior to induction Oxygen Delivery Method: Circle system utilized Preoxygenation: Pre-oxygenation with 100% oxygen Induction Type: IV induction Ventilation: Mask ventilation without difficulty and Oral airway inserted - appropriate to patient size Laryngoscope Size: 2 and Miller Grade View: Grade I Tube type: Oral Tube size: 8.0 mm Number of attempts: 1 Airway Equipment and Method: Stylet Placement Confirmation: ETT inserted through vocal cords under direct vision,  positive ETCO2 and breath sounds checked- equal and bilateral Secured at: 22 cm Tube secured with: Tape Dental Injury: Teeth and Oropharynx as per pre-operative assessment

## 2018-11-09 NOTE — Progress Notes (Signed)
   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 S/p BCG induction and maintenance with high grade reucrrences most recetnly 07/2018 TaG3. Most recetn STaging CT 09/2018 w/o advanced disease, nromal sigmoid and ileo-cecal anatomy, persistant left lateral/dome bnladder thickeneing and some stranding. Has never had in prostatic urethra.   Today " Billy Rasmussen " is ready for surgery. Had bowel prep to clear yesterday.    Objective: Vital signs in last 24 hours: Temp:  [97.9 F (36.6 C)-98.4 F (36.9 C)] 98.4 F (36.9 C) (06/10 0445) Pulse Rate:  [62-68] 62 (06/10 0445) Resp:  [16-18] 18 (06/10 0445) BP: (136-169)/(82-92) 163/92 (06/10 0445) SpO2:  [97 %-100 %] 97 % (06/10 0445) Weight:  [106.1 kg] 106.1 kg (06/10 0739) Last BM Date: 11/08/18  Intake/Output from previous day: 06/09 0701 - 06/10 0700 In: 841.8 [P.O.:475; I.V.:366.8] Out: 1200 [Urine:1200] Intake/Output this shift: No intake/output data recorded.  Physical Exam  Constitutional: He appears well-developed.  Eyes: Pupils are equal, round, and reactive to light.  Neck: Normal range of motion.  Cardiovascular: Normal rate.  GI: Soft.  Midline scars w/o hernias. RLQ ostomy marking site noted.   Genitourinary:    Genitourinary Comments: NO CVAT   Musculoskeletal: Normal range of motion.  Skin: Skin is warm.  Psychiatric: He has a normal mood and affect.   Lab Results:  Recent Labs    11/08/18 1555  WBC 6.5  HGB 14.3  HCT 40.5  PLT 128*   BMET Recent Labs    11/08/18 1555  NA 137  K 3.6  CL 104  CO2 27  GLUCOSE 92  BUN 12  CREATININE 0.79  CALCIUM 9.1   PT/INR No results for input(s): LABPROT, INR in the last 72 hours. ABG No results for input(s): PHART, HCO3 in the last 72 hours.  Invalid input(s): PCO2, PO2  Studies/Results: No results found.  Anti-infectives: Anti-infectives (From admission, onward)   Start      Dose/Rate Route Frequency Ordered Stop   11/09/18 0000  piperacillin-tazobactam (ZOSYN) IVPB 3.375 g     3.375 g 100 mL/hr over 30 Minutes Intravenous 30 min pre-op 11/08/18 1511     11/08/18 1800  metroNIDAZOLE (FLAGYL) tablet 500 mg     500 mg Oral Every 6 hours 11/08/18 1516 11/09/18 0638   11/08/18 1800  neomycin (MYCIFRADIN) tablet 500 mg     500 mg Oral Every 6 hours 11/08/18 1516 11/09/18 1610      Assessment/Plan:  1 - Recurrent High Grade Bladder Cancer - proceed as planned with cystoprostatectomy and diversion.   Alexis Frock 11/09/2018

## 2018-11-09 NOTE — Anesthesia Postprocedure Evaluation (Signed)
Anesthesia Post Note  Patient: Billy Rasmussen  Procedure(s) Performed: ROBOT ASSISTED LAPAROSCOPIC RADICAL CYSTOPROSTATECTOMY BILATERAL PELVIC LYMPHADENECTOMY,ORTHOTOPIC NEOBLADDER, EXTENSIVE ADHESHIONLYSIS (N/A ) CYSTOSCOPY WITH INJECTION OF INDOCYANINE GREEN DYE (N/A )     Patient location during evaluation: PACU Anesthesia Type: General Level of consciousness: awake and alert Pain management: pain level controlled Vital Signs Assessment: post-procedure vital signs reviewed and stable Respiratory status: spontaneous breathing, nonlabored ventilation, respiratory function stable and patient connected to nasal cannula oxygen Cardiovascular status: blood pressure returned to baseline and stable Postop Assessment: no apparent nausea or vomiting Anesthetic complications: no    Last Vitals:  Vitals:   11/09/18 1600 11/09/18 1639  BP: 121/74   Pulse: 80   Resp: 14 14  Temp: 36.7 C   SpO2: 94% 94%    Last Pain:  Vitals:   11/09/18 1639  TempSrc:   PainSc: Asleep                 Myangel Summons S

## 2018-11-09 NOTE — Brief Op Note (Signed)
11/09/2018  3:06 PM  PATIENT:  Billy Rasmussen  73 y.o. male  PRE-OPERATIVE DIAGNOSIS:  RECURRENT BLADDER CANCER  POST-OPERATIVE DIAGNOSIS:  RECURRENT BLADDER CANCER  PROCEDURE:  Procedure(s) with comments: ROBOT ASSISTED LAPAROSCOPIC RADICAL CYSTOPROSTATECTOMY BILATERAL PELVIC LYMPHADENECTOMY,ORTHOTOPIC NEOBLADDER, EXTENSIVE ADHESHIONLYSIS (N/A) - 6 HRS CYSTOSCOPY WITH INJECTION OF INDOCYANINE GREEN DYE (N/A)  SURGEON:  Surgeon(s) and Role:    * Alexis Frock, MD - Primary  PHYSICIAN ASSISTANT:   ASSISTANTS: Debbrah Alar PA   ANESTHESIA:   general  EBL:  350 mL   BLOOD ADMINISTERED:none  DRAINS: 1 - RLQ Urostomy wtih Rt (red) and Lt (blue) bander stents, LLQ JP to bulb   LOCAL MEDICATIONS USED:  MARCAINE     SPECIMEN:  Source of Specimen:  1 - cystoprostatectomy; 2- pelvic lymph nodes; 3 - distal ureteral margins  DISPOSITION OF SPECIMEN:  PATHOLOGY  COUNTS:  YES  TOURNIQUET:  * No tourniquets in log *  DICTATION: .Other Dictation: Dictation Number [id number]  PLAN OF CARE: Admit to inpatient   PATIENT DISPOSITION:  PACU - hemodynamically stable.   Delay start of Pharmacological VTE agent (>24hrs) due to surgical blood loss or risk of bleeding: yes

## 2018-11-09 NOTE — Transfer of Care (Signed)
Immediate Anesthesia Transfer of Care Note  Patient: Billy Rasmussen  Procedure(s) Performed: ROBOT ASSISTED LAPAROSCOPIC RADICAL CYSTOPROSTATECTOMY BILATERAL PELVIC LYMPHADENECTOMY,ORTHOTOPIC NEOBLADDER, EXTENSIVE ADHESHIONLYSIS (N/A ) CYSTOSCOPY WITH INJECTION OF INDOCYANINE GREEN DYE (N/A )  Patient Location: PACU  Anesthesia Type:General  Level of Consciousness: drowsy and patient cooperative  Airway & Oxygen Therapy: Patient Spontanous Breathing and Patient connected to face mask oxygen  Post-op Assessment: Report given to RN and Post -op Vital signs reviewed and stable  Post vital signs: Reviewed and stable  Last Vitals:  Vitals Value Taken Time  BP 164/79 11/09/2018  3:08 PM  Temp    Pulse 75 11/09/2018  3:09 PM  Resp 21 11/09/2018  3:09 PM  SpO2 98 % 11/09/2018  3:09 PM  Vitals shown include unvalidated device data.  Last Pain:  Vitals:   11/09/18 0739  TempSrc:   PainSc: 0-No pain         Complications: No apparent anesthesia complications

## 2018-11-10 ENCOUNTER — Encounter (HOSPITAL_COMMUNITY): Payer: Self-pay | Admitting: Urology

## 2018-11-10 LAB — BASIC METABOLIC PANEL
Anion gap: 13 (ref 5–15)
BUN: 21 mg/dL (ref 8–23)
CO2: 23 mmol/L (ref 22–32)
Calcium: 7.8 mg/dL — ABNORMAL LOW (ref 8.9–10.3)
Chloride: 95 mmol/L — ABNORMAL LOW (ref 98–111)
Creatinine, Ser: 1.96 mg/dL — ABNORMAL HIGH (ref 0.61–1.24)
GFR calc Af Amer: 39 mL/min — ABNORMAL LOW (ref >60)
GFR calc non Af Amer: 33 mL/min — ABNORMAL LOW (ref >60)
Glucose, Bld: 177 mg/dL — ABNORMAL HIGH (ref 70–99)
Potassium: 4.7 mmol/L (ref 3.5–5.1)
Sodium: 131 mmol/L — ABNORMAL LOW (ref 135–145)

## 2018-11-10 LAB — NOVEL CORONAVIRUS, NAA (HOSP ORDER, SEND-OUT TO REF LAB; TAT 18-24 HRS): SARS-CoV-2, NAA: NOT DETECTED

## 2018-11-10 LAB — HEMOGLOBIN AND HEMATOCRIT, BLOOD
HCT: 37.4 % — ABNORMAL LOW (ref 39.0–52.0)
Hemoglobin: 12.1 g/dL — ABNORMAL LOW (ref 13.0–17.0)

## 2018-11-10 MED ORDER — SODIUM CHLORIDE 0.9 % IV BOLUS
1000.0000 mL | Freq: Once | INTRAVENOUS | Status: AC
  Administered 2018-11-10: 1000 mL via INTRAVENOUS

## 2018-11-10 MED ORDER — CHLORHEXIDINE GLUCONATE CLOTH 2 % EX PADS
6.0000 | MEDICATED_PAD | Freq: Every day | CUTANEOUS | Status: DC
  Administered 2018-11-10: 6 via TOPICAL

## 2018-11-10 NOTE — Op Note (Signed)
NAME: Billy Rasmussen, Billy Rasmussen. MEDICAL RECORD [id number] ACCOUNT [id number] DATE OF BIRTH:12-04-1945 FACILITY[facility] LOCATION: [facility] PHYSICIAN:Camala Talwar, MD  OPERATIVE REPORT  DATE OF PROCEDURE:  11/09/2018  PREOPERATIVE DIAGNOSIS:  Recurrent high-grade bladder cancer.  PROCEDURE: 1.  Laparoscopic extensive adhesiolysis. 2.  Laparoscopic radical cystoprostatectomy with bilateral pelvic lymphadenectomy and ileal conduit urinary diversion. 3.  Cystoscopy with injection of indocyanine green dye.  ASSISTANT:  Debbrah Alar, PA-C  ESTIMATED BLOOD LOSS:  50 mL.  COMPLICATIONS:  None.  SPECIMENS: 1.  Right distal ureteral margin, frozen section negative. 2.  Final right distal ureteral margin. 3.  Left distal ureteral margin, frozen section negative. 4.  Final left distal ureteral margin. 5.  Right external iliac lymph nodes. 6.  Right obturator lymph nodes. 7.  Right internal iliac lymph nodes. 8.  Right common iliac lymph nodes. 9.  Left external iliac lymph nodes. 10.  Left internal iliac lymph nodes. 11.  Left obturator lymph nodes. 12.  Left common iliac lymph nodes. 41.  Cystoprostatectomy, all for permanent pathology.  Sentinel lymph nodes denoted on pathology requisition.  FINDINGS: 1.  Significant adhesions, mostly omental with some small bowel interloop given multiple prior surgeries. 2.  Multiple sentinel lymph nodes in the pelvis. 3.  No obvious locally advanced bladder cancer.  COMPLICATIONS: 1.  Jackson-Pratt drain to bulb suction. 2.  Right lower quadrant urostomy with right (red) and left (blue) bander stents to gravity drainage.  INDICATIONS:  The patient is a very pleasant 73 year old man with a longstanding history of recurrent high-grade bladder cancer.  This is BCG refractory and recurrent.  Options were discussed for further management including bladder preservation versus  cystectomy with the latter being associated with the highest chance of cure.   After consideration of multiple options, he decided to proceed with curative intent cystoprostatectomy.  He does have a history of multiple prior abdominal surgeries for  diverticulitis including colostomy, colostomy takedown.  Nodes on preoperative imaging revealed no evidence of distant disease and preserved fat planes anteriorly.  It was felt that robotic laparoscopic approach would be potentially feasible, and he  wished to proceed.  He was admitted yesterday for stomal marking, bowel prep, and labs.  Informed consent was obtained and placed in the medical record.  PROCEDURE IN DETAIL:  The patient being identified and the procedure being cystoscopy with injection of indocyanine green dye and robotic-assisted prostatectomy with conduit diversion was confirmed.  Procedure timeout was performed.  IV antibiotics were  administered.  General endotracheal anesthesia was induced.  The patient was placed into a low lithotomy position.  A sterile field was created by prepping and draping his penis, perineum and proximal thighs using iodine and his infraxiphoid abdomen  using chlorhexidine gluconate after clipper shaving.  He was further fashioned on the operating table using 3-inch tape with foam padding across the supraxiphoid chest.  A test-induced Trendelenburg positioning was performed and found to be suitably  positioned.  Sequential compression devices were confirmed.  His lateral knees were padded.  His arms were tucked to the side with gel rolls.  Next, cystourethroscopy was performed with a 24-French rigid cystoscopic injection set.  Inspection of anterior  and posterior urethra were unremarkable.  Inspection of urinary bladder revealed some erythematous and subtle papillary changes along the left bladder lateral wall consistent with likely early recurrence of carcinoma or carcinoma in situ.  This area was  demarcated using indocyanine green dye using approximately 2 mL across 6 aliquots submucosal  blebs which we  were marking and sentinel lymph angiography.  Foley catheter was then placed free to straight drain.  Next, the abdomen was carefully inspected  again, and a site for the peritoneal reflection was chosen in the right upper quadrant.  The patient's prior imaging and his scars were felt to likely be away from any obvious areas of prohibitive adhesions.  A high-flow, low-pressure pneumoperitoneum  was obtained using Veress technique in this area and passed the aspiration and drop test.  Next, an 8 mm robotic camera port was placed in the left paramedian location.  Laparoscopic examination of the peritoneal cavity through this revealed multiple  significant adhesions, mostly omental, in the midline and some on the right hemiabdomen.  It was clearly felt that adhesiolysis would be necessary for placing additional robotic ports.  As such, a secondary 5 mm port was placed in the left upper quadrant  approximately 3 fingerbreadths below the costal margin, and using exquisite critically careful dissection, omental attachments were taken down from the anterior abdominal wall using laparoscopic dissection with cautery scissors.  Several loops of small  bowel were taken down from the right upper quadrant and right lateral area, taking exquisite care to avoid urethral injury, which did not occur.  Next, we achieved sufficient adhesiolysis to allow additional port placement which was then performed as  follows:  An 8 mm robotic port approximately 2 fingerbreadths above the umbilicus, a right paramedian 8 mm robotic port, right far lateral 12 mm assist port, a 15 mm right paramedian assistant port site at the level of the previously marked stomal site,  and a left far lateral 8 mm robotic port site.  The robot was then docked and passed the electronic checks.  Attention was directed at additional adhesiolysis in the right lower quadrant in the area to expose the area of the cecum and ileocecal junction.    There were multiple small bowel interloop adhesions in this area which were very carefully taken down using robotic assistance.  Attention was directed at development of the left retroperitoneum.  An incision was made lateral to the descending colon  from the area of the iliac vessels superiorly to the area of the origins of the diaphragm and inferiorly lateral to the left medial umbilical ligament, thus creating a peritoneal flap which was used to retract the bowel medially.  Dissection proceeded  directly on the common iliac vessels.  Left ureter was encountered.  Marked the vessel loop, dissected proximally for a distance of approximately 8 cm and distally to the ureterovesical junction, which was doubly clipped and ligated, the proximal one  being a tagged dyed suture.  Frozen section negative for carcinoma.  The ureters tugged out of the true pelvis on the left side.  Attention was directed at the pelvic lymphadenectomy.  First, the left external iliac group with boundaries being left  external iliac artery, vein, pelvic sidewall, iliac bifurcation.  Lymphostasis was achieved with cold clips.  Next, the left obturator group was dissected free of the boundaries, being left external iliac vein, obturator nerve, pelvic sidewall.   Lymphostasis was achieved with cold clips.  Left internal group was dissected free with the boundaries being the iliac bifurcation and the superior vesicle artery, and then the common group was dissected free with the boundaries being the aortic  bifurcation and iliac bifurcation.  Laterally, dissection then proceeded lateral to the left bladder wall towards the area of the endopelvic fascia, which was swept away from the lateral aspect of the  prostate, thus completing the left lateral  dissection.  Attention was directed to the right retroperitoneal development.  An incision was made lateral to the ascending colon from the area of the ileocecal junction superiorly and then  inferiorly coursing over the iliac vessels and lateral to right  medial umbilical ligament, thus creating a retroperitoneal flap on the right side which was then allowed medial retraction.  The right ureter was encountered as it coursed over the iliac vessels.  I marked a vessel loop, dissected proximally a distance  of approximately 6 cm and distally to its junction, which was doubly clipped and ligated, the proximal being a white tagged suture.  Frozen section negative for carcinoma.  This was tucked high in the true pelvis and right-sided pelvic lymphadenectomy  was performed as per the left side.  Sentinel lymph nodes were demarcated as such.  Bilateral obturator nerve was inspected and found to be uninjured.  Right lateral dissection proceeded down past the level of the bladder and endopelvic fascia.   Attention was directed to the posterior dissection, and an inverted U incision was made distally in the posterior peritoneal flap just inferior to the area of the seminal vesicles.  Dissection proceeded within this plane towards the area of the apex of  the prostate.  This exposed the vascular pedicles of the bladder and prostate which were controlled using a sequential vascular stapling technique, taking exquisite care to avoid injury to the obturator nerve or rectum, which did not occur.  The space of  Retzius was then developed which exposed the dorsal venous complex.  It was controlled using a green load stapler, avoiding membranous urethral injury.  Final apical dissection was performed in the anterior plane placing the cystoprostatectomy area on  superior traction, transecting the membranous urethra coldly and transecting the in situ Foley catheter purposely with clipping of the proximal to lateral bucket handle and manipulation of the cystoprostatectomy specimen, which was then placed into an  extra-large EndoCatch bag.  The membranous urethral stump was oversewn using 3-0 V-Loc.  Digital rectal  exam was then performed using indicator glove and laparoscopic vision.  No evidence of rectal violation was noted.  Sponge and needle counts were  correct.  Hemostasis appeared excellent.  The left ureter was then tunneled at the retroperitoneal window just anterior to the aortic bifurcation behind the area of the colon and allowed to be passed to the right hemipelvis.  The area of the ileocecal  junction was identified in an area of terminal ileum approximately 15 cm proximal.  This was tied using a silk tag suture.  All the tagged sutures were grasped with a self-locking laparoscopic grasper via the 15 mm assistant port site.  The specimen  retrieval bag was brought through the level of the string.  The left paramedian robotic port site was then undocked.  Specimen was retrieved by extending the previous camera port site inferiorly, erring on the left side of the umbilicus for a distance of  approximately 6 cm, removing the cystoprostatectomy specimen, setting aside for permanent pathology.  Wound protector was then applied through this.  Bilateral ureters and the conduit area of ileum also easily had sufficient mobility through this.   Attention was directed to harvesting of the conduit segment.  The segment of ileum approximately 14 cm in length was taken out of continuity at the previously marked area and exquisite care to avoid devascularization of the mesentery.  The mesentery was  developed with 2 white load  staple loads distally, 1 stapler proximally, and then put into a peritoneal orientation.  Bowel-to-bowel anastomosis was performed in a side-to-side fashion using 2 green load stapler fires sequentially which resulted in  excellent patency of the anastomosis, which was then bolstered at the level of the acute angle using interrupted silk.  The free end was oversewn with running silk and a second layer of imbricating silk.  A mesenteric defect was controlled using  interrupted silk, and the  bowel-to-bowel anastomosis was visibly viable and palpably patent and redelivered into the area of the abdominal cavity.  The proximal staple line of the conduit was excluded using running Vicryl.  The distal staple line was  excised.  Attention was directed at the ureteroileal anastomosis, first with the left ureter.  A segment of proximal conduit bowel was excised approximately 4 mm.  Four mucosal everting sutures were applied of Vicryl, and the left ureter was spatulated  and a final margin set.  Spatulation was performed at approximately 8 mm.  A heel stitch of interrupted Vicryl was placed, and a red-colored bander stent 26 cm to the anastomosis and then ureteral anastomosis was then finally performed using 2 separate  running suture lines of 4-0 Vicryl, which resulted in excellent tension-free apposition.  The right ureter was anastomosed in a similar fashion using a red-colored bander stent.  Notably, the previous left one had been a blue colored.  The right side was  performed on the contralateral aspect of the proximal conduit segment.  A quarter-sized diameter column of skin and subcutaneous tissue was then excised to the level of the previously marked conduit site down to the level of fascia which was dilated to  accommodate 2 surgeon's fingers, and the distal conduit was brought through this and anchored to the level of the fascia using interrupted Vicryl x4 to prevent parastomal hernias.  Omentum was then brought over the extraction site after the bowel was  carefully inspected.  No evidence of a leak was noted.  The fascia was reapproximated using figure-of-eight Novafil x8, followed by reapproximation of Scarpa's with running Vicryl.  Attention was then directed to the maturation of the stoma.  Four  rosebudding sutures were applied of 2-0 Vicryl, which revealed an excellent rosebudding of the stomal. The stomal skin edges were then reapproximated using additional interrupted Vicryl x3 each  quadrant.  A closed suction drain had been previously placed  just prior to undocking the robot, and a drain stitch was applied.  All incision sites were infiltrated with dilute lipolyzed Marcaine and closed the skin using subcuticular Monocryl and Dermabond.  A stoma appliance was placed.  The procedure  terminated.  The patient tolerated the procedure well.  No immediate complications.  The patient was taken to postanesthesia care in stable condition with plan for step-down admission overnight.  Please note first assistant Debbrah Alar was crucial for all portions of the surgery today.  She provided invaluable retraction, specimen manipulation, vascular stapling, lymphatic clipping and general first assistance.  LN/NUANCE  D:11/09/2018 T:11/10/2018 JOB:[id number]

## 2018-11-10 NOTE — Progress Notes (Signed)
Urology Progress Note   1 Day Post-Op s/p robotic radical cystectomy with ileal conduit and lymph node dissection  Subjective: NAEON. Pain controlled with pain medications. Reports slight dizziness and nausea this AM.   Objective: Vital signs in last 24 hours: Temp:  [97.4 F (36.3 C)-98.4 F (36.9 C)] 98.2 F (36.8 C) (06/11 0400) Pulse Rate:  [76-127] 95 (06/11 0700) Resp:  [14-23] 15 (06/11 0700) BP: (80-166)/(46-96) 81/57 (06/11 0700) SpO2:  [93 %-100 %] 97 % (06/11 0700) Arterial Line BP: (109-151)/(62-74) 109/62 (06/10 1530) Weight:  [106.1 kg] 106.1 kg (06/10 0739)  Intake/Output from previous day: 06/10 0701 - 06/11 0700 In: 3643.8 [I.V.:3348.4; IV Piggyback:295.4] Out: 1415 [Urine:925; Drains:140; Blood:350] Intake/Output this shift: No intake/output data recorded.  Physical Exam:  General: Alert and oriented CV: HDS, regular rate Lungs: NWOB  Abdomen: Soft, appropriately tender. Non distended. Incisions c/d/i. JP SS, stoma moist and healthy - slightly darker on lateral aspect, but healthy appearing. Two bander stents visible  GU: urine light pink urine Ext: NT, No erythema  Lab Results: Recent Labs    11/08/18 1555 11/09/18 1511 11/10/18 0251  HGB 14.3 13.5 12.1*  HCT 40.5 39.8 37.4*   BMET Recent Labs    11/08/18 1555 11/10/18 0251  NA 137 131*  K 3.6 4.7  CL 104 95*  CO2 27 23  GLUCOSE 92 177*  BUN 12 21  CREATININE 0.79 1.96*  CALCIUM 9.1 7.8*     Studies/Results: No results found.  Assessment/Plan:  73 y.o. male s/p RC/IC with lymph node dissection. Overall doing well post-op, slightly hypotensive this AM.   - Continue NPO.  - Continue zosyn x 48 hours post-operatively.  - 1L NS bolus this AM.  - Continue IVF. - Continue bilateral ureteral stents in place.  - Continue JP drain. - Continue multi-modal pain regimen. - DVT Prophylaxis: SCDs, early ambulation (3X today), hold chemoprophylaxis given bleeding risk.  - If blood  pressure improved this afternoon, possible transfer to floor.     LOS: 2 days

## 2018-11-10 NOTE — Consult Note (Signed)
[place] Nurse ostomy consult note Stoma type/location: RLQ, ileal conduit Stomal assessment/size: 1 1 3/8" visualized through pouch Peristomal assessment: NA Treatment options for stomal/peristomal skin: NA Output bloody urine Ostomy pouching: 2pc. From OR Education provided:  Explained role of ostomy nurse Patient is not feeling well, with cold wash cloth over his face.  Left educational materials in the patient's room for use tomorrow. I explained I would come tomorrow when he was feeling better to change his pouch for the first time with him observing. He has a history of a previous colostomy related to diverticulitis.   Enrolled patient in [place] program: No   W[facility] Nurse will follow along with you for continued support with ostomy teaching and care [place] MSN, [place], [place], [place], [place]

## 2018-11-10 NOTE — Progress Notes (Signed)
Patient transferred at 1630. Agree with the previous nurses assessment. Will continue to monitor.

## 2018-11-11 LAB — BASIC METABOLIC PANEL
Anion gap: 7 (ref 5–15)
BUN: 22 mg/dL (ref 8–23)
CO2: 26 mmol/L (ref 22–32)
Calcium: 7.8 mg/dL — ABNORMAL LOW (ref 8.9–10.3)
Chloride: 102 mmol/L (ref 98–111)
Creatinine, Ser: 1.13 mg/dL (ref 0.61–1.24)
GFR calc Af Amer: >60 mL/min (ref >60)
GFR calc non Af Amer: >60 mL/min (ref >60)
Glucose, Bld: 136 mg/dL — ABNORMAL HIGH (ref 70–99)
Potassium: 4.1 mmol/L (ref 3.5–5.1)
Sodium: 135 mmol/L (ref 135–145)

## 2018-11-11 LAB — HEMOGLOBIN AND HEMATOCRIT, BLOOD
HCT: 29.6 % — ABNORMAL LOW (ref 39.0–52.0)
Hemoglobin: 9.5 g/dL — ABNORMAL LOW (ref 13.0–17.0)

## 2018-11-11 MED ORDER — SODIUM CHLORIDE 0.9% FLUSH: 10.0000 mL | INTRAVENOUS | Status: DC | PRN

## 2018-11-11 MED ORDER — ACETAMINOPHEN 10 MG/ML IV SOLN
1000.0000 mg | Freq: Four times a day (QID) | INTRAVENOUS | Status: AC
  Administered 2018-11-11 – 2018-11-12 (×4): 1000 mg via INTRAVENOUS
  Filled 2018-11-11 (×4): qty 100

## 2018-11-11 NOTE — Progress Notes (Signed)
Urology Progress Note   2 Days Post-Op s/p robotic radical cystectomy with ileal conduit and lymph node dissection with extensive lysis of adhesions   Subjective: NAEON. Pain controlled with pain medications. Nausea overnight and this morning, improved after coughing up phlegm. Ambulated to the hallway last night.   Objective: Vital signs in last 24 hours: Temp:  [98.3 F (36.8 C)-99.4 F (37.4 C)] 99.2 F (37.3 C) (06/12 1006) Pulse Rate:  [94-109] 97 (06/12 1006) Resp:  [14-22] 22 (06/12 1229) BP: (127-148)/(65-84) 133/65 (06/12 1006) SpO2:  [94 %-100 %] 96 % (06/12 1229)  Intake/Output from previous day: 06/11 0701 - 06/12 0700 In: 2369.6 [P.O.:20; I.V.:2006.5; IV Piggyback:343] Out: 1749 [SWHQP:5916; Drains:40] Intake/Output this shift: Total I/O In: 508.5 [I.V.:497.8; IV Piggyback:10.7] Out: -   Physical Exam:  General: Alert and oriented CV: HDS, regular rate Lungs: NWOB  Abdomen: Soft, appropriately tender. Non distended. Incisions c/d/i. JP SS, stoma moist and healthy, two bander stents visible.  GU: light pink urine Ext: NT, No erythema  Lab Results: Recent Labs    11/09/18 1511 11/10/18 0251 11/11/18 0635  HGB 13.5 12.1* 9.5*  HCT 39.8 37.4* 29.6*   BMET Recent Labs    11/10/18 0251 11/11/18 0635  NA 131* 135  K 4.7 4.1  CL 95* 102  CO2 23 26  GLUCOSE 177* 136*  BUN 21 22  CREATININE 1.96* 1.13  CALCIUM 7.8* 7.8*     Studies/Results: No results found.  Assessment/Plan:  73 y.o. male s/p RC/IC with lymph node dissection. Slow to progress post-operatively.   1. Post-operative care: Transferred to floor POD1. Continue multimodal pain regimen (currently still in PCA given lack of po tolerance, will start IV tylenol and continue PCA). S/p 48 hours of zosyn post-operatively. Continue JP drain.   2. Acute blood loss anemia: Continue to monitor hemoglobin. Blood pressure currently stable.   3. HGT1 bladder cancer: s/p RC/IC with lymph node  dissection. Surgical pathology pending. Continue bilateral ureteral tents in place.   4. Post-operative ileus: Persistent nausea on POD2. Continue NPO given ongoing nausea. Consider CLD later today.  5. DVT Prophylaxis: SCDs, early ambulation (3X today), hold chemoprophylaxis given bleeding risk.     LOS: 3 days

## 2018-11-11 NOTE — Progress Notes (Signed)
PT Cancellation Note  Patient Details Name: TINY RIETZ MRN: [id number] DOB: [birth year]   Cancelled Treatment:    Reason Eval/Treat Not Completed: Fatigue/lethargy limiting ability to participate. Pt had just finished a walk with nursing when PT came to evaluate.  He states it went much better than his last walk, but would like to rest.  Will check back as schedule permits to complete eval.   Galen Manila 11/11/2018, 2:04 PM

## 2018-11-11 NOTE — Care Management Important Message (Signed)
Important Message  Patient Details IM Letter given to Dessa Phi RN to present to the Patient Name: Billy Rasmussen MRN: [id number] Date of Birth: 25-Sep-1945   Medicare Important Message Given:  Yes    [name] 11/11/2018, 11:04 AM

## 2018-11-11 NOTE — Consult Note (Signed)
[facility] Nurse ostomy follow up Stoma type/location: RLQ, ileal conduit  Stomal assessment/size: pink, moist, budded, blue and red stents are in place  Peristomal assessment:  Treatment options for stomal/peristomal skin:  Output blood tinged urine in drainage bag Ostomy pouching: 1pc convex with 2" barrier ring  Education provided:  Explained role of ostomy nurse and creation of stoma  Explained stoma characteristics (budded, flush, color, texture, care) Demonstrated pouch change (cutting new barrier, measuring stoma, cleaning peristomal skin and stoma, use of barrier ring) Education on use wick in stoma to keep skin dry with pouch change ( use of TP or tampon)  Education on emptying when 1/3 to 1/2 full and how to empty Education on urine characteristics (sediment, mucous) Demonstrated hooking pouch to nighttime drainage bag. Patient practiced opening and closing and hooking to night time drainage bag Discussed bathing dehydration  Video made for patient due to no visitor restrictions at this time.  Enrolled patient in [place] Start Discharge program: Yes   [place] Nurse will follow along with you for continued support with ostomy teaching and care [place] MSN, [place], [place], [place], [place]

## 2018-11-11 NOTE — TOC Initial Note (Signed)
Transition of Care [facility]) - Initial/Assessment Note    Patient Details  Name: Billy Rasmussen MRN: [id number] Date of Birth: Feb 27, 1946  Transition of Care [facility]) CM/SW Contact:    Dessa Phi, RN Phone Number: 11/11/2018, 12:19 PM  Clinical Narrative: Patient has [employer] for home 02 uses HS-will monitor if needed more often. WOC following for ostomy teaching-supplies in rm.PT eval-await recc.                  Expected Discharge Plan: Home/Self Care Barriers to Discharge: Continued Medical Work up   Patient Goals and CMS Choice        Expected Discharge Plan and Services Expected Discharge Plan: Home/Self Care   Discharge Planning Services: CM Consult   Living arrangements for the past 2 months: Single Family Home                                      Prior Living Arrangements/Services Living arrangements for the past 2 months: Single Family Home Lives with:: Spouse Patient language and need for interpreter reviewed:: Yes Do you feel safe going back to the place where you live?: Yes      Need for Family Participation in Patient Care: No (Comment) Care giver support system in place?: Yes (comment) Current home services: DME, Other (comment)([employer]-home 02 uses HS) Criminal Activity/Legal Involvement Pertinent to Current Situation/Hospitalization: No - Comment as needed  Activities of Daily Living Home Assistive Devices/Equipment: None ADL Screening (condition at time of admission) Patient's cognitive ability adequate to safely complete daily activities?: Yes Is the patient deaf or have difficulty hearing?: No Does the patient have difficulty seeing, even when wearing glasses/contacts?: No Does the patient have difficulty concentrating, remembering, or making decisions?: No Patient able to express need for assistance with ADLs?: Yes Does the patient have difficulty dressing or bathing?: No Independently performs ADLs?: Yes (appropriate for developmental  age) Does the patient have difficulty walking or climbing stairs?: No Weakness of Legs: None Weakness of Arms/Hands: None  Permission Sought/Granted Permission sought to share information with : Case Manager Permission granted to share information with : Yes, Verbal Permission Granted  Share Information with NAME: spouse           Emotional Assessment Appearance:: Appears stated age Attitude/Demeanor/Rapport: Gracious   Orientation: : Oriented to Self, Oriented to Place, Oriented to  Time, Oriented to Situation Alcohol / Substance Use: Never Used Psych Involvement: No (comment)  Admission diagnosis:  RECURRENT BLADDER CANCER Patient Active Problem List   Diagnosis Date Noted  . Bladder cancer ([place]) 11/09/2018  . 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 Acute cystitis with hematuria 10/02/2016  . Pure hypercholesterolemia 10/02/2016  . Malignant neoplasm of urinary bladder ([place]) 10/02/2016  . Lipoma 09/30/2016  . Obese 03/31/2016  . S/P right TKA 03/30/2016  . S/P knee replacement 03/30/2016  . HTN (hypertension) 02/25/2016  . Healthcare maintenance 02/25/2016  . Obesity (BMI 30-39.9) 01/07/2016  . S/P shoulder replacement 07/20/2013  . Shoulder arthritis 04/07/2013  . CELLULITIS, KNEE, LEFT 08/26/2006   PCP:  Terald Sleeper, PA-C Pharmacy[facility]   [id number] [place], [place] [address] Phone: [phone] Fax: [phone]  [facility] [place], [place] [address] Phone: [phone] Fax: [phone]     Social Determinants of Health (SDOH) Interventions  Readmission Risk Interventions No flowsheet data found.

## 2018-11-12 LAB — BASIC METABOLIC PANEL
Anion gap: 4 — ABNORMAL LOW (ref 5–15)
BUN: 11 mg/dL (ref 8–23)
CO2: 28 mmol/L (ref 22–32)
Calcium: 8.2 mg/dL — ABNORMAL LOW (ref 8.9–10.3)
Chloride: 104 mmol/L (ref 98–111)
Creatinine, Ser: 0.81 mg/dL (ref 0.61–1.24)
GFR calc Af Amer: >60 mL/min (ref >60)
GFR calc non Af Amer: >60 mL/min (ref >60)
Glucose, Bld: 118 mg/dL — ABNORMAL HIGH (ref 70–99)
Potassium: 3.7 mmol/L (ref 3.5–5.1)
Sodium: 136 mmol/L (ref 135–145)

## 2018-11-12 LAB — TYPE AND SCREEN
ABO/RH(D): O NEG
Antibody Screen: NEGATIVE
Unit division: 0
Unit division: 0

## 2018-11-12 LAB — BPAM RBC
Blood Product Expiration Date: 202007052359
Blood Product Expiration Date: 202007072359
Unit Type and Rh: 9500
Unit Type and Rh: 9500

## 2018-11-12 LAB — HEMOGLOBIN AND HEMATOCRIT, BLOOD
HCT: 24.9 % — ABNORMAL LOW (ref 39.0–52.0)
Hemoglobin: 8.2 g/dL — ABNORMAL LOW (ref 13.0–17.0)

## 2018-11-12 NOTE — Progress Notes (Signed)
PT Cancellation Note  Patient Details Name: Billy Rasmussen MRN: [id number] DOB: 11/05/1945   Cancelled Treatment:    Reason Eval/Treat Not Completed: PT screened, no needs identified, will sign off. Pt walking in the hallway with nursing using RW.  No deficits noted and nursing providing A only for lines.  Spoke with pt and he states he has a RW at home and 1 small step to enter. He verbalized proper body mechanics for getting in and out of bed to decrease strain/pain at abd.  Pt will be d/c from caseload with screen only and pt in agreement.  Thank you for the referral.  Galen Manila 11/12/2018, 2:27 PM

## 2018-11-12 NOTE — Progress Notes (Signed)
3 Days Post-Op Subjective: Patient reports carmpy abd pain, fells bowels moving but no flatus yet. Ambulating well.   Objective: Vital signs in last 24 hours: Temp:  [97.7 F (36.5 C)-98.9 F (37.2 C)] 98.9 F (37.2 C) (06/13 1047) Pulse Rate:  [79-116] 80 (06/13 1047) Resp:  [16-22] 18 (06/13 1047) BP: (126-142)/(67-82) 132/80 (06/13 1047) SpO2:  [96 %-100 %] 97 % (06/13 1047)  Intake/Output from previous day: 06/12 0701 - 06/13 0700 In: 2028.6 [I.V.:1817.9; IV Piggyback:210.7] Out: 2275 [Urine:2250; Drains:25] Intake/Output this shift: Total I/O In: 520 [P.O.:520] Out: -   Physical Exam:  NAD Watching TV Alert and oriented CV - RRR Resp - reg effort and depth Abd - soft, NT, inc C/D/I, ostomy pink viable, urine clear, mild distended  Ext - no calf pain or swelling   Lab Results: Recent Labs    11/10/18 0251 11/11/18 0635 11/12/18 0314  HGB 12.1* 9.5* 8.2*  HCT 37.4* 29.6* 24.9*   BMET Recent Labs    11/11/18 0635 11/12/18 0314  NA 135 136  K 4.1 3.7  CL 102 104  CO2 26 28  GLUCOSE 136* 118*  BUN 22 11  CREATININE 1.13 0.81  CALCIUM 7.8* 8.2*   No results for input(s): LABPT, INR in the last 72 hours. No results for input(s): LABURIN in the last 72 hours. Results for orders placed or performed during the hospital encounter of 11/08/18  MRSA PCR Screening     Status: None   Collection Time: 11/08/18  6:41 PM   Specimen: Nasal Mucosa; Nasopharyngeal  Result Value Ref Range Status   MRSA by PCR NEGATIVE NEGATIVE Final    Comment:        The GeneXpert MRSA Assay (FDA approved for NASAL specimens only), is one component of a comprehensive MRSA colonization surveillance program. It is not intended to diagnose MRSA infection nor to guide or monitor treatment for MRSA infections. Performed at [facility], [place] [address] [place], [place] [address]   Novel Coronavirus, NAA (hospital order; send-out to ref lab)     Status: None   Collection Time: 11/08/18 10:21 PM   Specimen: Nasopharyngeal Swab; Respiratory  Result Value Ref Range Status   SARS-CoV-2, NAA NOT DETECTED NOT DETECTED Final    Comment: (NOTE) This test was developed and its performance characteristics determined by [employer]. This test has not been FDA cleared or approved. This test has been authorized by FDA under an Emergency Use Authorization (EUA). This test is only authorized for the duration of time the declaration that circumstances exist justifying the authorization of the emergency use of in vitro diagnostic tests for detection of SARS-CoV-2 virus and/or diagnosis of COVID-19 infection under section 564(b)(1) of the Act, 21 U.S.C. 335KTG-2(B)(6), unless the authorization is terminated or revoked sooner. When diagnostic testing is negative, the possibility of a false negative result should be considered in the context of a patient's recent exposures and the presence of clinical signs and symptoms consistent with COVID-19. An individual without symptoms of COVID-19 and who is not shedding SARS-CoV-2 virus would expect to have a negative (not detected) result in this assay. Performed  At: [facility] [place], [place] [id number] Rush Farmer MD JG:[id number]    [place]  Final    Comment: Performed at [facility] [address], [place], [place] [address]    Studies/Results: No results found.  Assessment/Plan: POD#3 s/p robotic radical cystectomy with ileal conduit and lymph node  dissection with extensive lysis of adhesions   1. Post-operative care: Transferred to floor POD1. Continue multimodal pain regimen (currently still in PCA given lack of po tolerance, will start IV tylenol and continue PCA). S/p 48 hours of zosyn post-operatively. Continue JP drain.   2. Acute blood loss anemia: Continue to monitor hemoglobin. Blood pressure currently stable. Now  8.2. No dizziness.   3. HGT1 bladder cancer: s/p RC/IC with lymph node dissection. Surgical pathology pending. Continue bilateral ureteral tents in place.   4. Post-operative ileus: Persistent nausea on POD2. On clears, advance when flatus.   5. DVT Prophylaxis: SCDs, early ambulation (3X today), hold chemoprophylaxis given bleeding risk.    LOS: 4 days   Festus Aloe 11/12/2018, 11:03 AM

## 2018-11-12 NOTE — Progress Notes (Addendum)
Pt ambulated in hallway x2 this shift, at least 360 feet each time using RW. Minimal standby assist. O2 Sats remained 94% and greater on RA during entire walk. Pt tolerated very well and sat up in chair majority of shift. IS on bedside table.

## 2018-11-12 NOTE — Progress Notes (Signed)
Patient ambulated on hall way at this time via walker. Tolerated well.

## 2018-11-13 LAB — BASIC METABOLIC PANEL
Anion gap: 3 — ABNORMAL LOW (ref 5–15)
BUN: 8 mg/dL (ref 8–23)
CO2: 30 mmol/L (ref 22–32)
Calcium: 8.5 mg/dL — ABNORMAL LOW (ref 8.9–10.3)
Chloride: 102 mmol/L (ref 98–111)
Creatinine, Ser: 0.69 mg/dL (ref 0.61–1.24)
GFR calc Af Amer: >60 mL/min (ref >60)
GFR calc non Af Amer: >60 mL/min (ref >60)
Glucose, Bld: 113 mg/dL — ABNORMAL HIGH (ref 70–99)
Potassium: 3.6 mmol/L (ref 3.5–5.1)
Sodium: 135 mmol/L (ref 135–145)

## 2018-11-13 LAB — HEMOGLOBIN AND HEMATOCRIT, BLOOD
HCT: 25.6 % — ABNORMAL LOW (ref 39.0–52.0)
Hemoglobin: 8.5 g/dL — ABNORMAL LOW (ref 13.0–17.0)

## 2018-11-13 NOTE — Progress Notes (Signed)
Pt has ambulated in hallway x2 this shift and sat up in chair for several hours before returning to bed. Surgical pain under control- pt c/o chronic pain to left ankle. Heat pack applied.

## 2018-11-13 NOTE — Progress Notes (Signed)
4 Days Post-Op Subjective: Patient reports feeling better. Walking the halls. Passed good flatus - "blew the curtains away!"  Objective: Vital signs in last 24 hours: Temp:  [98.2 F (36.8 C)-98.9 F (37.2 C)] 98.4 F (36.9 C) (06/14 0440) Pulse Rate:  [77-97] 84 (06/14 0440) Resp:  [14-21] 18 (06/14 0725) BP: (130-145)/(75-87) 145/75 (06/14 0440) SpO2:  [96 %-100 %] 100 % (06/14 0725)  Intake/Output from previous day: 06/13 0701 - 06/14 0700 In: 2533.8 [P.O.:1120; I.V.:1413.8] Out: 2705 [Urine:2600; Drains:105] Intake/Output this shift: Total I/O In: 1300 [P.O.:480; I.V.:820] Out: 1000 [Urine:1000]  Physical Exam:  NAD Sitting in recliner, Watching TV Alert and oriented CV - RRR Resp - reg effort and depth Abd - soft, NT, inc C/D/I, ostomy pink viable, urine clear  Ext - no calf pain or swelling   Lab Results: Recent Labs    11/11/18 0635 11/12/18 0314 11/13/18 0536  HGB 9.5* 8.2* 8.5*  HCT 29.6* 24.9* 25.6*   BMET Recent Labs    11/12/18 0314 11/13/18 0536  NA 136 135  K 3.7 3.6  CL 104 102  CO2 28 30  GLUCOSE 118* 113*  BUN 11 8  CREATININE 0.81 0.69  CALCIUM 8.2* 8.5*   No results for input(s): LABPT, INR in the last 72 hours. No results for input(s): LABURIN in the last 72 hours. Results for orders placed or performed during the hospital encounter of 11/08/18  MRSA PCR Screening     Status: None   Collection Time: 11/08/18  6:41 PM   Specimen: Nasal Mucosa; Nasopharyngeal  Result Value Ref Range Status   MRSA by PCR NEGATIVE NEGATIVE Final    Comment:        The GeneXpert MRSA Assay (FDA approved for NASAL specimens only), is one component of a comprehensive MRSA colonization surveillance program. It is not intended to diagnose MRSA infection nor to guide or monitor treatment for MRSA infections. Performed at [facility], [place] [address] [place] [address], [place], [place] [address]   Novel Coronavirus, NAA (hospital order; send-out to  ref lab)     Status: None   Collection Time: 11/08/18 10:21 PM   Specimen: Nasopharyngeal Swab; Respiratory  Result Value Ref Range Status   SARS-CoV-2, NAA NOT DETECTED NOT DETECTED Final    Comment: (NOTE) This test was developed and its performance characteristics determined by [employer]. This test has not been FDA cleared or approved. This test has been authorized by FDA under an Emergency Use Authorization (EUA). This test is only authorized for the duration of time the declaration that circumstances exist justifying the authorization of the emergency use of in vitro diagnostic tests for detection of SARS-CoV-2 virus and/or diagnosis of COVID-19 infection under section 564(b)(1) of the Act, 21 U.S.C. 338SNK-5(L)(9), unless the authorization is terminated or revoked sooner. When diagnostic testing is negative, the possibility of a false negative result should be considered in the context of a patient's recent exposures and the presence of clinical signs and symptoms consistent with COVID-19. An individual without symptoms of COVID-19 and who is not shedding SARS-CoV-2 virus would expect to have a negative (not detected) result in this assay. Performed  At: [facility] [place], [place] [id number] Rush Farmer MD TK:[id number]    [place]  Final    Comment: Performed at [facility] [address] [place], [place] [address]    Studies/Results: No results found.  Assessment/Plan: POD#3 s/p robotic radical cystectomy with ileal conduit and  lymph node dissection with extensive lysis of adhesions  1. Post-operative care:Transferred to floor POD1. D/c PCA today POD# 4. Continue JP drain.   2. Acute blood loss anemia:Continue to monitor hemoglobin. Blood pressure currently stable. Stable 8.5. No dizziness.   3. HGT1 bladder cancer:s/p RC/IC with lymph node dissection. Surgical pathology  pending. Continue bilateral ureteral tents in place.   4. Post-operative ileus:Persistent nausea on POD2. On clears, flatus POD#4 today, advanced to full liquid.   5.DVT Prophylaxis:SCDs, early ambulation (3X today), hold chemoprophylaxis given bleeding risk.    LOS: 5 days   Festus Aloe 11/13/2018, 10:36 AM

## 2018-11-14 ENCOUNTER — Inpatient Hospital Stay (HOSPITAL_COMMUNITY): Payer: Medicare Other

## 2018-11-14 LAB — BASIC METABOLIC PANEL
Anion gap: 7 (ref 5–15)
BUN: 10 mg/dL (ref 8–23)
CO2: 27 mmol/L (ref 22–32)
Calcium: 8.9 mg/dL (ref 8.9–10.3)
Chloride: 101 mmol/L (ref 98–111)
Creatinine, Ser: 0.61 mg/dL (ref 0.61–1.24)
GFR calc Af Amer: >60 mL/min (ref >60)
GFR calc non Af Amer: >60 mL/min (ref >60)
Glucose, Bld: 116 mg/dL — ABNORMAL HIGH (ref 70–99)
Potassium: 3.8 mmol/L (ref 3.5–5.1)
Sodium: 135 mmol/L (ref 135–145)

## 2018-11-14 LAB — URIC ACID: Uric Acid, Serum: 4 mg/dL (ref 3.7–8.6)

## 2018-11-14 LAB — HEMOGLOBIN AND HEMATOCRIT, BLOOD
HCT: 26 % — ABNORMAL LOW (ref 39.0–52.0)
Hemoglobin: 8.4 g/dL — ABNORMAL LOW (ref 13.0–17.0)

## 2018-11-14 MED ORDER — ENOXAPARIN SODIUM 40 MG/0.4ML ~~LOC~~ SOLN
40.0000 mg | SUBCUTANEOUS | Status: DC
  Administered 2018-11-14 – 2018-11-15 (×2): 40 mg via SUBCUTANEOUS
  Filled 2018-11-14 (×2): qty 0.4

## 2018-11-14 MED ORDER — ACETAMINOPHEN 500 MG PO TABS
1000.0000 mg | ORAL_TABLET | Freq: Three times a day (TID) | ORAL | Status: DC
  Administered 2018-11-14 – 2018-11-16 (×7): 1000 mg via ORAL
  Filled 2018-11-14 (×7): qty 2

## 2018-11-14 MED ORDER — KETOROLAC TROMETHAMINE 10 MG PO TABS
10.0000 mg | ORAL_TABLET | Freq: Three times a day (TID) | ORAL | Status: DC
  Administered 2018-11-14 – 2018-11-16 (×7): 10 mg via ORAL
  Filled 2018-11-14 (×7): qty 1

## 2018-11-14 NOTE — Progress Notes (Signed)
During bedside rounding, RN and night RN were assessing patient's surgical incisions and ostomy and noticed on the right side of patient's bed was a large amount of blood noted on the sheets and gown.  Upon further assessment, the incision to the right of the ostomy was draining. A gauze dressing was applied to site.  Patient was also complaining of severe left ankle pain and stated it started yesterday and also in his left thumb where that pain has since subsided. It was reported to day RN that patient ambulated in the hall prior to ankle pain but at this time he is using max assist with 2 RNs plus front wheel walker to side step to the chair and putting minimal weight on the left foot while grimacing in pain.  MD rounded soon after and made aware of ankle pain and drainage from the right lap site.  At this time, the gauze dressing is CDI.  Will continue to monitor.

## 2018-11-14 NOTE — TOC Initial Note (Signed)
Transition of Care [facility]) - Initial/Assessment Note    Patient Details  Name: Billy Rasmussen MRN: [id number] Date of Birth: 07-30-1945  Transition of Care [facility]) CM/SW Contact:    Dessa Phi, RN Phone Number: 11/14/2018, 3:59 PM  Clinical Narrative:Patient has new ileal conduit. Previous colostomy instruction,but spouse is primary caregiver for her parents.Provided w/HHC agency await choice if needed for [facility] nursing.   Home health agencies that serve [id number].        [place] Quality of Patient Care Rating Patient Survey Summary Rating  [facility] [phone] 4 out of 5 stars 4 out of [place] [phone] 4  out of 5 stars 3 out of [place] [phone] 4 out of 5 stars 4 out of 5 stars  [place] [phone]                       Expected Discharge Plan: [place] Barriers to Discharge: Continued Medical Work up   Patient Goals and CMS Choice Patient states their goals for this hospitalization and ongoing recovery are:: go home CMS Medicare.gov Compare Post Acute Care list provided to:: Patient Choice offered to / list presented to : Patient  Expected Discharge Plan and Services Expected Discharge Plan: [place]   Discharge Planning Services: CM Consult Post Acute Care Choice: [place] arrangements for the past 2 months: Single Family Home                                      Prior Living Arrangements/Services Living arrangements for the past 2 months: Single Family Home Lives with:: Spouse Patient language and need for interpreter reviewed:: Yes Do you feel safe going back to the place where you live?: Yes      Need for Family Participation in Patient Care: No (Comment) Care giver support system in place?: Yes (comment) Current home  services: DME(home 02 HS-[employer]) Criminal Activity/Legal Involvement Pertinent to Current Situation/Hospitalization: No - Comment as needed  Activities of Daily Living Home Assistive Devices/Equipment: None ADL Screening (condition at time of admission) Patient's cognitive ability adequate to safely complete daily activities?: Yes Is the patient deaf or have difficulty hearing?: No Does the patient have difficulty seeing, even when wearing glasses/contacts?: No Does the patient have difficulty concentrating, remembering, or making decisions?: No Patient able to express need for assistance with ADLs?: Yes Does the patient have difficulty dressing or bathing?: No Independently performs ADLs?: Yes (appropriate for developmental age) Does the patient have difficulty walking or climbing stairs?: No Weakness of Legs: None Weakness of Arms/Hands: None  Permission Sought/Granted Permission sought to share information with : Case Manager Permission granted to share information with : Yes, Verbal Permission Granted  Share Information with NAME: spouse  Permission granted to share info w AGENCY: All [facility] agencies in network        Emotional Assessment Appearance:: Appears stated age Attitude/Demeanor/Rapport: Gracious Affect (typically observed): Accepting Orientation: : Oriented to Self, Oriented to Place, Oriented to  Time, Oriented to Situation Alcohol / Substance Use: Tobacco Use, Never Used Psych Involvement: No (comment)  Admission diagnosis:  RECURRENT BLADDER CANCER Patient Active Problem List   Diagnosis Date Noted  . Bladder  cancer ([place]) 11/09/2018  . 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 Acute cystitis with hematuria 10/02/2016  . Pure hypercholesterolemia 10/02/2016  . Malignant neoplasm of urinary bladder ([place]) 10/02/2016  . Lipoma 09/30/2016  . Obese 03/31/2016  . S/P right TKA 03/30/2016  . S/P knee replacement 03/30/2016  . HTN (hypertension) 02/25/2016  . Healthcare  maintenance 02/25/2016  . Obesity (BMI 30-39.9) 01/07/2016  . S/P shoulder replacement 07/20/2013  . Shoulder arthritis 04/07/2013  . CELLULITIS, KNEE, LEFT 08/26/2006   PCP:  Terald Sleeper, PA-C [facility]acy[facility]   [id number] [place], [place] [address] Phone: [phone] Fax: [phone]  [facility] - [place], [place] [address] Phone: [phone] Fax: [phone]     Social Determinants of Health (SDOH) Interventions    Readmission Risk Interventions No flowsheet data found.

## 2018-11-14 NOTE — Plan of Care (Signed)
  Problem: Nutrition: Goal: Adequate nutrition will be maintained Outcome: Progressing   Problem: Clinical Measurements: Goal: Respiratory complications will improve Outcome: Completed/Met

## 2018-11-14 NOTE — Progress Notes (Signed)
5 Days Post-Op Subjective: Is overall feeling improved. Continued to have flatus. Denies nausea. Reports left ankle pain with movement and when weight-bearing. Denies trauma. Bleeding from a RLQ incision this AM.   Objective: Vital signs in last 24 hours: Temp:  [99 F (37.2 C)-99.8 F (37.7 C)] 99.1 F (37.3 C) (06/15 0433) Pulse Rate:  [89-90] 89 (06/15 0433) Resp:  [16-22] 16 (06/15 0433) BP: (149-160)/(80) 149/80 (06/15 0433) SpO2:  [98 %-100 %] 100 % (06/15 0433)  Intake/Output from previous day: 06/14 0701 - 06/15 0700 In: 2918 [P.O.:600; I.V.:2318] Out: 4967 [Urine:5025; Drains:150] Intake/Output this shift: No intake/output data recorded.  Physical Exam:  NAD Sitting in recliner, talking on phone Alert and oriented CV - RRR Resp - reg effort and depth Abd - soft, NT, inc C/D/I with RLQ incision dressed with gauze, ostomy pink viable, urine clear, right flank ecchymosis Ext - no calf pain or swelling, left ankle TTP and tender with movement over left malleolus   Lab Results: Recent Labs    11/12/18 0314 11/13/18 0536 11/14/18 0531  HGB 8.2* 8.5* 8.4*  HCT 24.9* 25.6* 26.0*   BMET Recent Labs    11/13/18 0536 11/14/18 0531  NA 135 135  K 3.6 3.8  CL 102 101  CO2 30 27  GLUCOSE 113* 116*  BUN 8 10  CREATININE 0.69 0.61  CALCIUM 8.5* 8.9   No results for input(s): LABPT, INR in the last 72 hours. No results for input(s): LABURIN in the last 72 hours. Results for orders placed or performed during the hospital encounter of 11/08/18  MRSA PCR Screening     Status: None   Collection Time: 11/08/18  6:41 PM   Specimen: Nasal Mucosa; Nasopharyngeal  Result Value Ref Range Status   MRSA by PCR NEGATIVE NEGATIVE Final    Comment:        The GeneXpert MRSA Assay (FDA approved for NASAL specimens only), is one component of a comprehensive MRSA colonization surveillance program. It is not intended to diagnose MRSA infection nor to guide or monitor  treatment for MRSA infections. Performed at [facility], [place] [address] [place], [place] [address]   Novel Coronavirus, NAA (hospital order; send-out to ref lab)     Status: None   Collection Time: 11/08/18 10:21 PM   Specimen: Nasopharyngeal Swab; Respiratory  Result Value Ref Range Status   SARS-CoV-2, NAA NOT DETECTED NOT DETECTED Final    Comment: (NOTE) This test was developed and its performance characteristics determined by [employer]. This test has not been FDA cleared or approved. This test has been authorized by FDA under an Emergency Use Authorization (EUA). This test is only authorized for the duration of time the declaration that circumstances exist justifying the authorization of the emergency use of in vitro diagnostic tests for detection of SARS-CoV-2 virus and/or diagnosis of COVID-19 infection under section 564(b)(1) of the Act, 21 U.S.C. 846KZL-9(J)(5), unless the authorization is terminated or revoked sooner. When diagnostic testing is negative, the possibility of a false negative result should be considered in the context of a patient's recent exposures and the presence of clinical signs and symptoms consistent with COVID-19. An individual without symptoms of COVID-19 and who is not shedding SARS-CoV-2 virus would expect to have a negative (not detected) result in this assay. Performed  At: [facility] [address] [place], [place] [id number] Rush Farmer MD ZE:[id number]    [place]  Final    Comment: Performed at [facility] [place]  [place] [address] [place], [place] [address]    Studies/Results: No results found.  Assessment/Plan: POD#5 s/p robotic radical cystectomy with ileal conduit and lymph node dissection with extensive lysis of adhesions  1. Post-operative care:Transferred to floor POD1. Continue multimodal pain regimen. Continue JP drain.   2. Acute blood loss  anemia:Continue to monitor hemoglobin. Blood pressure currently stable. Stable 8.4. No dizziness.   3. HGT1 bladder cancer:s/p RC/IC with lymph node dissection. Surgical pathology demonstrates diffuse CIS, lymph nodes negative. Continue bilateral ureteral stents in place.   4. Post-operative ileus:Persistent nausea on POD2. On clears, flatus POD#4. advanced to full liquid POD4. Advance to regular diet today.   5.DVT Prophylaxis:SCDs, early ambulation (3X today), Start lovenox ppx today.   6. Left ankle pain: XR negative for abnormal pathology. Serum uric acid level normal. Start toradol.    LOS: 6 days   Dorothey Baseman 11/14/2018, 7:15 AM

## 2018-11-14 NOTE — Care Management Important Message (Signed)
Important Message  Patient Details IM Letter given to Dessa Phi RN to present to the Patient Name: Billy Rasmussen MRN: [id number] Date of Birth: 03-29-1946   Medicare Important Message Given:  Yes    [name] 11/14/2018, 12:43 PM

## 2018-11-14 NOTE — Consult Note (Signed)
[place] Nurse ostomy follow up Stoma type/location: RLQ, ileal conduit Stomal assessment/size: 1 3/8" round, budded, pink and moist Peristomal assessment: NA Treatment options for stomal/peristomal skin: using 2" barrier ring to aid in seal Output yellow urine Ostomy pouching: 1pc.convex with 2" barrier ring.  Connected to BSD at the time of my visit. New onset ankle pain, reports to be severe. He was only able to turn and pivot with 2 assist to get into the chair.  Today he is groggy from pain meds, falling asleep during 2 different attempts to visit for education and pouch change.   Education provided:  Requested patient to practice with night time drainage now that O2 sensor is off of his finger. Was having some new onset finger pain as well this weekend. This has resolved.  Enrolled patient in [place] Start Discharge program: Yes  Patient is not able to participate in education on pouch change today. Will plan to have team member follow up tomorrow for improvement in his status. Extra supplies in the room for use.   [facility] Nurse will follow along with you for continued support with ostomy teaching and care Herrick MSN, RN, [place], [place], [place]

## 2018-11-15 LAB — CREATININE, FLUID (PLEURAL, PERITONEAL, JP DRAINAGE): Creat, Fluid: <0.3 mg/dL

## 2018-11-15 NOTE — Progress Notes (Signed)
6 Days Post-Op Subjective: Continues to feel improved. BM this morning. Reports significanti mprovement in ankle pain - thinks it was from SCDs. Reports scrotal swelling.   Objective: Vital signs in last 24 hours: Temp:  [97.6 F (36.4 C)-99.6 F (37.6 C)] 99.6 F (37.6 C) (06/16 1344) Pulse Rate:  [78-103] 103 (06/16 1344) Resp:  [14-18] 18 (06/16 1344) BP: (141-155)/(85-89) 141/88 (06/16 1344) SpO2:  [96 %-100 %] 96 % (06/16 1344)  Intake/Output from previous day: 06/15 0701 - 06/16 0700 In: 1080 [P.O.:1080] Out: 2937 [Urine:2575; Drains:362] Intake/Output this shift: Total I/O In: 240 [P.O.:240] Out: 510 [Urine:300; Drains:210]  Physical Exam:  NAD Lying in bed Alert and oriented CV - RRR Resp - reg effort and depth Abd - soft, NT, inc C/D/I with RLQ incision dressed with gauze, ostomy pink viable, urine clear, two stents visible right flank ecchymosis GU: mild scrotal edema, ecchymosis   Lab Results: Recent Labs    11/13/18 0536 11/14/18 0531  HGB 8.5* 8.4*  HCT 25.6* 26.0*   BMET Recent Labs    11/13/18 0536 11/14/18 0531  NA 135 135  K 3.6 3.8  CL 102 101  CO2 30 27  GLUCOSE 113* 116*  BUN 8 10  CREATININE 0.69 0.61  CALCIUM 8.5* 8.9   No results for input(s): LABPT, INR in the last 72 hours. No results for input(s): LABURIN in the last 72 hours. Results for orders placed or performed during the hospital encounter of 11/08/18  MRSA PCR Screening     Status: None   Collection Time: 11/08/18  6:41 PM   Specimen: Nasal Mucosa; Nasopharyngeal  Result Value Ref Range Status   MRSA by PCR NEGATIVE NEGATIVE Final    Comment:        The GeneXpert MRSA Assay (FDA approved for NASAL specimens only), is one component of a comprehensive MRSA colonization surveillance program. It is not intended to diagnose MRSA infection nor to guide or monitor treatment for MRSA infections. Performed at [facility], [place] [address],  [place], [place] [address]   Novel Coronavirus, NAA (hospital order; send-out to ref lab)     Status: None   Collection Time: 11/08/18 10:21 PM   Specimen: Nasopharyngeal Swab; Respiratory  Result Value Ref Range Status   SARS-CoV-2, NAA NOT DETECTED NOT DETECTED Final    Comment: (NOTE) This test was developed and its performance characteristics determined by [employer]. This test has not been FDA cleared or approved. This test has been authorized by FDA under an Emergency Use Authorization (EUA). This test is only authorized for the duration of time the declaration that circumstances exist justifying the authorization of the emergency use of in vitro diagnostic tests for detection of SARS-CoV-2 virus and/or diagnosis of COVID-19 infection under section 564(b)(1) of the Act, 21 U.S.C. 419QQI-2(L)(7), unless the authorization is terminated or revoked sooner. When diagnostic testing is negative, the possibility of a false negative result should be considered in the context of a patient's recent exposures and the presence of clinical signs and symptoms consistent with COVID-19. An individual without symptoms of COVID-19 and who is not shedding SARS-CoV-2 virus would expect to have a negative (not detected) result in this assay. Performed  At: [facility] [place], [place] [id number] Rush Farmer MD DE:[id number]    [place]  Final    Comment: Performed at [facility] [address] [place], [place] [address]    Studies/Results: Dg Ankle 2 Views  Left  Result Date: 11/14/2018 CLINICAL DATA:  Left ankle pain EXAM: LEFT ANKLE - 2 VIEW COMPARISON:  None. FINDINGS: There is no evidence of fracture, dislocation, or joint effusion. There is no evidence of arthropathy or other focal bone abnormality. Soft tissues are unremarkable. IMPRESSION: Negative. Electronically Signed   By: Kathreen Devoid   On: 11/14/2018 09:14     Assessment/Plan: POD#5 s/p robotic radical cystectomy with ileal conduit and lymph node dissection with extensive lysis of adhesions  1. Post-operative care:Transferred to floor POD1. Continue multimodal pain regimen. Continue JP drain.   2. Acute blood loss anemia:Continue to monitor hemoglobin. Blood pressure currently stable. Stable 8.4 yesterday. No dizziness.   3. HGT1 bladder cancer:s/p RC/IC with lymph node dissection. Surgical pathology demonstrates diffuse CIS, lymph nodes negative. Continue bilateral ureteral stents in place.   4. Post-operative ileus:Persistent nausea on POD2. On clears, flatus POD#4. advanced to full liquid POD4. Advanced to regular diet 6/15. BM on 6/16.   5.DVT Prophylaxis:SCDs, early ambulation (3X today), Continue lovenox ppx today.   6. Left ankle pain: XR negative for abnormal pathology. Serum uric acid level normal. Improved today.  7. Scrotal edema: Recommended scrotal elevation and scrotal support.    LOS: 7 days   Dorothey Baseman 11/15/2018, 4:39 PM

## 2018-11-15 NOTE — Plan of Care (Signed)
  Problem: Activity: Goal: Risk for activity intolerance will decrease 11/15/2018 2308 by Carney Corners, RN Outcome: Progressing Note: Pt ambulated in hallway with a walker and standby assistance only

## 2018-11-15 NOTE — Consult Note (Signed)
[place] Nurse ostomy follow up Stoma type/location: RLQ ileal conduit Stomal assessment/size: slightly smaller than 1 and 3/8 inches round, budded, red and moist. Two stents in tact.  Red = right, Blue = left Peristomal assessment: intact in the immediate peristomal area, with linear medical adhesive related skin loss at lateral edge from 8-10 o'clock, reepithelializing Treatment options for stomal/peristomal skin: Liquid barrier film over area Output: blood-tinged urine, clearing Ostomy pouching: 1pc.convex ostomy pouching system with skin barrier ring Education provided:  Demonstrated pouch change (cutting new barrier, measuring stoma, cleaning peristomal skin and stoma, use of barrier ring). Instructed patient that he would notice stoma shrinking as edema subsided over next 3-4 weeks and that this was rationale for using cut-to-fit pouches in the initiat post op period. Education on use wick in stoma to keep skin dry with pouch change Education on emptying when 1/3 to 1/2 full and how to empty Education on urine characteristics (sediment, mucous) Answered patient questions. Patient to have [facility] for a few weeks post discharge from acute care.  Enrolled patient in [place] Discharge program: Yes, previously.  Patent has 6 pouches, 6 skin barrier rings and 2 bedside urinary drainage bags for home use. He has the contact info of my partner, Para March for use as needed.  Ready for discharge from the ostomy nurse standpoint.  [place] nursing team will follow while in house and will remain available to this patient, the nursing and medical teams.  Thanks, Maudie Flakes, MSN, RN, [place], Arther Abbott  Pager# [phone]

## 2018-11-16 MED ORDER — OXYCODONE HCL 5 MG PO TABS: 5.0000 mg | ORAL_TABLET | ORAL | 0 refills | Status: DC | PRN

## 2018-11-16 NOTE — Discharge Summary (Signed)
[facility] Discharge Summary  Admit date: 11/08/2018  Discharge date and time: 11/16/18   Discharge to: Home  Discharge Service: Urology  Discharge Attending Physician:  Alexis Frock, MD  Discharge  Diagnoses: Bladder cancer  Secondary Diagnosis: Active Problems:   Bladder cancer ([place])  OR Procedures: Procedure(s): ROBOT ASSISTED LAPAROSCOPIC RADICAL CYSTOPROSTATECTOMY BILATERAL PELVIC LYMPHADENECTOMY,ORTHOTOPIC NEOBLADDER, EXTENSIVE ADHESHIONLYSIS CYSTOSCOPY WITH INJECTION OF INDOCYANINE GREEN DYE 11/09/2018   Ancillary Procedures: None   Discharge Day Services: The patient was seen and examined by the Urology team both in the morning and immediately prior to discharge.  Vital signs and laboratory values were stable and within normal limits.  The physical exam was benign and unchanged and all surgical wounds were examined.  Discharge instructions were explained and all questions answered.  Subjective  No acute events overnight. Pain Controlled. No fever or chills.  Objective Patient Vitals for the past 8 hrs:  BP Temp Pulse Resp SpO2  11/16/18 0539 (!) 150/76 98.6 F (37 C) 69 18 90 %   No intake/output data recorded.  General Appearance:        No acute distress Lungs:                       Normal work of breathing on room air Heart:                                Regular rate and rhythm Abdomen:                         Soft, non-tender, non-distended, incisions c/d/i, JP removed and site dressed, ostomy moist and pink with two stents visible and urine clear light yellow / orange Extremities:                      Warm and well perfused   Hospital Course:  The patient underwent radical cystectomy with ileal conduit and lymph node dissection on 11/09/2018.  The patient tolerated the procedure well, was extubated in the OR, and afterwards was taken to the PACU for routine post-surgical care. When stable the patient was transferred to the floor. The patient did well  postoperatively. See additional details below:  1. Post-operative care:Transferred to floor POD1. Continued multimodal pain regimen. PCA discontinued on POD4. JP drain discontinued on POD6 after negative JP creatinine. Cleared by WOCN for discharge.  2. Acute blood loss anemia:Hemoblobin downtrended to 8.4, at which point it stabilized. Blood pressure stable.No dizziness.  3. HGT1 bladder cancer:s/p RC/IC with lymph node dissection. Surgical pathology demonstrated diffuse CIS, lymph nodes negative and was discussed with patient. Continue bilateral ureteral stents in place.   4. Post-operative ileus:Persistent nausea on POD2.On clears, flatus POD#4. advanced to full liquid POD4. Advanced to regular diet 6/15. BM on 6/16.   5.DVT Prophylaxis:SCDs, early ambulation (3X today), on lovenox ppx starting after Hgb stabilized.   6. Left ankle pain: XR negative for abnormal pathology. Serum uric acid level normal. Pain significantly improved prior to discharge; patient attributed to SCD compression.  7. Scrotal edema: Recommended scrotal elevation and scrotal support.   The patient was discharged home 7 Days Post-Op, at which point was tolerating a regular solid diet, have adequate pain control with P.O. pain medication, and could ambulate without difficulty. The patient will follow up with [place] for post op check.   Condition at Discharge: Improved  Discharge  Medications:  Allergies as of 11/16/2018   No Known Allergies     Medication List    TAKE these medications   ALLERGY EYE DROPS OP Place 1 drop into both eyes daily as needed (allergies).   diclofenac 75 MG EC tablet Commonly known as: VOLTAREN Take 1 tablet (75 mg total) by mouth 2 (two) times daily. What changed:   how much to take  when to take this   docusate sodium 100 MG capsule Commonly known as: COLACE Take 200 mg by mouth daily.   gabapentin 100 MG capsule Commonly known as: NEURONTIN Take 1 capsule (100  mg total) by mouth 3 (three) times daily.   lisinopril-hydrochlorothiazide 20-12.5 MG tablet Commonly known as: ZESTORETIC Take 1 tablet by mouth daily.   meclizine 25 MG tablet Commonly known as: ANTIVERT Take 25 mg by mouth 3 (three) times daily as needed for dizziness.   neomycin-bacitracin-polymyxin ointment Commonly known as: NEOSPORIN Apply 1 application topically daily as needed for wound care.

## 2018-11-16 NOTE — TOC Transition Note (Signed)
Transition of Care [facility]) - CM/SW Discharge Note   Patient Details  Name: Billy Rasmussen MRN: [id number] Date of Birth: 02-Apr-1946  [facility]) CM/SW Contact:  Dessa Phi, RN Phone Number: 11/16/2018, 11:37 AM   Clinical Narrative:[facility] chosen-able to accept rep Santiago Glad aware of d/c & HHRN orders for new ostomy instruction. No further CM needs.       Final next level of care: [place] Barriers to Discharge: No Barriers Identified   Patient Goals and CMS Choice Patient states their goals for this hospitalization and ongoing recovery are:: go home CMS Medicare.gov Compare Post Acute Care list provided to:: Patient Choice offered to / list presented to : Patient  Discharge Placement                       Discharge Plan and Services   Discharge Planning Services: CM Consult Post Acute Care Choice: Home Health                    HH Arranged: RN [place]: [place] ([place]) Date [place]: 11/16/18 Time [place]: 1137 Representative spoke with at [place]: [place] ([place]) Interventions     Readmission Risk Interventions No flowsheet data found.

## 2018-11-17 DIAGNOSIS — Z87891 Personal history of nicotine dependence: Secondary | ICD-10-CM | POA: Diagnosis not present

## 2018-11-17 DIAGNOSIS — Z436 Encounter for attention to other artificial openings of urinary tract: Secondary | ICD-10-CM | POA: Diagnosis not present

## 2018-11-17 DIAGNOSIS — C679 Malignant neoplasm of bladder, unspecified: Secondary | ICD-10-CM | POA: Diagnosis not present

## 2018-11-17 DIAGNOSIS — Z906 Acquired absence of other parts of urinary tract: Secondary | ICD-10-CM | POA: Diagnosis not present

## 2018-11-17 DIAGNOSIS — Z483 Aftercare following surgery for neoplasm: Secondary | ICD-10-CM | POA: Diagnosis not present

## 2018-11-18 DIAGNOSIS — C679 Malignant neoplasm of bladder, unspecified: Secondary | ICD-10-CM | POA: Diagnosis not present

## 2018-11-18 DIAGNOSIS — Z87891 Personal history of nicotine dependence: Secondary | ICD-10-CM | POA: Diagnosis not present

## 2018-11-18 DIAGNOSIS — Z906 Acquired absence of other parts of urinary tract: Secondary | ICD-10-CM | POA: Diagnosis not present

## 2018-11-18 DIAGNOSIS — Z483 Aftercare following surgery for neoplasm: Secondary | ICD-10-CM | POA: Diagnosis not present

## 2018-11-18 DIAGNOSIS — Z436 Encounter for attention to other artificial openings of urinary tract: Secondary | ICD-10-CM | POA: Diagnosis not present

## 2018-11-21 ENCOUNTER — Other Ambulatory Visit: Payer: Self-pay

## 2018-11-21 NOTE — Patient Outreach (Signed)
[facility]) Care Management  11/21/2018  Billy Rasmussen April 06, 1946 [id number]  EMMI: general discharge red alert Referral date: 11/21/18 Referral reason: Know who to call about changes in condition: No Insurance: Medicare Day # 1  Telephone call to patient regarding EMMI general discharge red alert. HIPAA verified with patient. RNCM introduced herself and explained reason for call.  Patient states he knows to call the urologist for any problems. Patient states he is scheduled for a follow up appointment with the urologist on December 01, 2018. Patient confirms he has transportation to his appointment.   Patient reports [facility] services with the [facility] have started.  Patient states he continues to have some pain from the surgery. He states he has night sweats. Patient states he still has some redness near the surgical site. He reports he is still having some drainage but the drainage is tapering. Patient states he changed his drainage bag.  Patient states the tape from the dressing had been a little bothersome but it is better now.  Patient reports he feels he is seeing some improvement from having the surgery. He states he is still a little week but feels overall he is doing pretty good. Patient states he is taking his medications as prescribed and denies any side effect. Patient denies any new symptoms.  RNCM discussed signs/ symptoms of infection with patient. Advised patient to call his doctor for these symptoms. Patient verbalized understanding.  Patient states he has a wellness visit scheduled in July with his primary MD office. Patient denies any further needs at this time.  RNCM advised patient to notify MD of any changes in condition prior to scheduled appointment. RNCM provided contact name and number:24 hour nurse advise line [phone].  RNCM verified patient aware of 911 services for urgent/ emergent needs.RNCM discussed COVID 19 precautions and symptoms.   Advised patient to contact her doctor for minor symptoms. If symptoms more severe call 911.  Patient verbalized understanding.  Advised patient they would receive another automated EMMI General discharge call to assess how they are doing.  Patient advised they would receive a call from a nurse if any of their responses were abnormal.  Patient voiced understanding and was appreciative of call.   PLAN: RNCM will close case due to patient being assessed and having no further needs.   Quinn Plowman RN,BSN,CCM [facility] Telephonic  [phone]

## 2018-11-22 DIAGNOSIS — Z436 Encounter for attention to other artificial openings of urinary tract: Secondary | ICD-10-CM | POA: Diagnosis not present

## 2018-11-22 DIAGNOSIS — Z483 Aftercare following surgery for neoplasm: Secondary | ICD-10-CM | POA: Diagnosis not present

## 2018-11-22 DIAGNOSIS — Z906 Acquired absence of other parts of urinary tract: Secondary | ICD-10-CM | POA: Diagnosis not present

## 2018-11-22 DIAGNOSIS — C679 Malignant neoplasm of bladder, unspecified: Secondary | ICD-10-CM | POA: Diagnosis not present

## 2018-11-22 DIAGNOSIS — Z87891 Personal history of nicotine dependence: Secondary | ICD-10-CM | POA: Diagnosis not present

## 2018-11-24 DIAGNOSIS — Z906 Acquired absence of other parts of urinary tract: Secondary | ICD-10-CM | POA: Diagnosis not present

## 2018-11-24 DIAGNOSIS — Z87891 Personal history of nicotine dependence: Secondary | ICD-10-CM | POA: Diagnosis not present

## 2018-11-24 DIAGNOSIS — Z436 Encounter for attention to other artificial openings of urinary tract: Secondary | ICD-10-CM | POA: Diagnosis not present

## 2018-11-24 DIAGNOSIS — Z483 Aftercare following surgery for neoplasm: Secondary | ICD-10-CM | POA: Diagnosis not present

## 2018-11-24 DIAGNOSIS — C679 Malignant neoplasm of bladder, unspecified: Secondary | ICD-10-CM | POA: Diagnosis not present

## 2018-11-29 DIAGNOSIS — Z483 Aftercare following surgery for neoplasm: Secondary | ICD-10-CM | POA: Diagnosis not present

## 2018-11-29 DIAGNOSIS — C679 Malignant neoplasm of bladder, unspecified: Secondary | ICD-10-CM | POA: Diagnosis not present

## 2018-11-29 DIAGNOSIS — Z906 Acquired absence of other parts of urinary tract: Secondary | ICD-10-CM | POA: Diagnosis not present

## 2018-11-29 DIAGNOSIS — Z436 Encounter for attention to other artificial openings of urinary tract: Secondary | ICD-10-CM | POA: Diagnosis not present

## 2018-11-29 DIAGNOSIS — Z87891 Personal history of nicotine dependence: Secondary | ICD-10-CM | POA: Diagnosis not present

## 2018-11-30 ENCOUNTER — Telehealth: Payer: Self-pay | Admitting: Physician Assistant

## 2018-11-30 NOTE — Telephone Encounter (Signed)
Yes please hold the lisinopril.  Keep up with your blood pressure let us know if it does not get better.

## 2018-11-30 NOTE — Telephone Encounter (Signed)
Billy Rasmussen at [facility] aware.

## 2018-12-15 DIAGNOSIS — C678 Malignant neoplasm of overlapping sites of bladder: Secondary | ICD-10-CM | POA: Diagnosis not present

## 2018-12-17 DIAGNOSIS — C679 Malignant neoplasm of bladder, unspecified: Secondary | ICD-10-CM | POA: Diagnosis not present

## 2018-12-17 DIAGNOSIS — Z483 Aftercare following surgery for neoplasm: Secondary | ICD-10-CM | POA: Diagnosis not present

## 2018-12-17 DIAGNOSIS — Z436 Encounter for attention to other artificial openings of urinary tract: Secondary | ICD-10-CM | POA: Diagnosis not present

## 2018-12-17 DIAGNOSIS — Z906 Acquired absence of other parts of urinary tract: Secondary | ICD-10-CM | POA: Diagnosis not present

## 2018-12-17 DIAGNOSIS — Z87891 Personal history of nicotine dependence: Secondary | ICD-10-CM | POA: Diagnosis not present

## 2018-12-21 DIAGNOSIS — N3000 Acute cystitis without hematuria: Secondary | ICD-10-CM | POA: Diagnosis not present

## 2018-12-22 ENCOUNTER — Inpatient Hospital Stay (HOSPITAL_COMMUNITY)
Admission: EM | Admit: 2018-12-22 | Discharge: 2018-12-25 | DRG: 872 | Disposition: A | Discharge status: Home / Self Care | Payer: Medicare Other | Attending: Internal Medicine | Admitting: Internal Medicine

## 2018-12-22 ENCOUNTER — Other Ambulatory Visit: Payer: Self-pay

## 2018-12-22 ENCOUNTER — Emergency Department (HOSPITAL_COMMUNITY): Payer: Medicare Other

## 2018-12-22 DIAGNOSIS — E860 Dehydration: Secondary | ICD-10-CM | POA: Diagnosis present

## 2018-12-22 DIAGNOSIS — Z96612 Presence of left artificial shoulder joint: Secondary | ICD-10-CM | POA: Diagnosis present

## 2018-12-22 DIAGNOSIS — Z833 Family history of diabetes mellitus: Secondary | ICD-10-CM

## 2018-12-22 DIAGNOSIS — R6520 Severe sepsis without septic shock: Secondary | ICD-10-CM

## 2018-12-22 DIAGNOSIS — Z820 Family history of epilepsy and other diseases of the nervous system: Secondary | ICD-10-CM

## 2018-12-22 DIAGNOSIS — B952 Enterococcus as the cause of diseases classified elsewhere: Secondary | ICD-10-CM | POA: Diagnosis not present

## 2018-12-22 DIAGNOSIS — Z8711 Personal history of peptic ulcer disease: Secondary | ICD-10-CM

## 2018-12-22 DIAGNOSIS — N10 Acute tubulo-interstitial nephritis: Secondary | ICD-10-CM | POA: Diagnosis present

## 2018-12-22 DIAGNOSIS — K5900 Constipation, unspecified: Secondary | ICD-10-CM | POA: Diagnosis present

## 2018-12-22 DIAGNOSIS — I10 Essential (primary) hypertension: Secondary | ICD-10-CM | POA: Diagnosis present

## 2018-12-22 DIAGNOSIS — Z96653 Presence of artificial knee joint, bilateral: Secondary | ICD-10-CM | POA: Diagnosis present

## 2018-12-22 DIAGNOSIS — Z96611 Presence of right artificial shoulder joint: Secondary | ICD-10-CM | POA: Diagnosis present

## 2018-12-22 DIAGNOSIS — Z79899 Other long term (current) drug therapy: Secondary | ICD-10-CM

## 2018-12-22 DIAGNOSIS — K8020 Calculus of gallbladder without cholecystitis without obstruction: Secondary | ICD-10-CM | POA: Diagnosis not present

## 2018-12-22 DIAGNOSIS — A4181 Sepsis due to Enterococcus: Secondary | ICD-10-CM | POA: Diagnosis present

## 2018-12-22 DIAGNOSIS — R509 Fever, unspecified: Secondary | ICD-10-CM | POA: Diagnosis not present

## 2018-12-22 DIAGNOSIS — C679 Malignant neoplasm of bladder, unspecified: Secondary | ICD-10-CM | POA: Diagnosis present

## 2018-12-22 DIAGNOSIS — N136 Pyonephrosis: Secondary | ICD-10-CM | POA: Diagnosis present

## 2018-12-22 DIAGNOSIS — A419 Sepsis, unspecified organism: Secondary | ICD-10-CM | POA: Diagnosis present

## 2018-12-22 DIAGNOSIS — N179 Acute kidney failure, unspecified: Secondary | ICD-10-CM | POA: Diagnosis present

## 2018-12-22 DIAGNOSIS — Z9079 Acquired absence of other genital organ(s): Secondary | ICD-10-CM | POA: Diagnosis not present

## 2018-12-22 DIAGNOSIS — E872 Acidosis, unspecified: Secondary | ICD-10-CM

## 2018-12-22 DIAGNOSIS — Z791 Long term (current) use of non-steroidal anti-inflammatories (NSAID): Secondary | ICD-10-CM | POA: Diagnosis not present

## 2018-12-22 DIAGNOSIS — Z8249 Family history of ischemic heart disease and other diseases of the circulatory system: Secondary | ICD-10-CM

## 2018-12-22 DIAGNOSIS — Z20828 Contact with and (suspected) exposure to other viral communicable diseases: Secondary | ICD-10-CM | POA: Diagnosis present

## 2018-12-22 DIAGNOSIS — A411 Sepsis due to other specified staphylococcus: Secondary | ICD-10-CM | POA: Diagnosis not present

## 2018-12-22 DIAGNOSIS — Z87891 Personal history of nicotine dependence: Secondary | ICD-10-CM | POA: Diagnosis not present

## 2018-12-22 DIAGNOSIS — Z906 Acquired absence of other parts of urinary tract: Secondary | ICD-10-CM | POA: Diagnosis not present

## 2018-12-22 DIAGNOSIS — Z1629 Resistance to other single specified antibiotic: Secondary | ICD-10-CM | POA: Diagnosis present

## 2018-12-22 DIAGNOSIS — N390 Urinary tract infection, site not specified: Secondary | ICD-10-CM | POA: Diagnosis present

## 2018-12-22 DIAGNOSIS — M797 Fibromyalgia: Secondary | ICD-10-CM | POA: Diagnosis present

## 2018-12-22 DIAGNOSIS — K219 Gastro-esophageal reflux disease without esophagitis: Secondary | ICD-10-CM

## 2018-12-22 DIAGNOSIS — R9431 Abnormal electrocardiogram [ECG] [EKG]: Secondary | ICD-10-CM | POA: Diagnosis present

## 2018-12-22 DIAGNOSIS — R439 Unspecified disturbances of smell and taste: Secondary | ICD-10-CM | POA: Diagnosis present

## 2018-12-22 LAB — URINALYSIS, ROUTINE W REFLEX MICROSCOPIC
Bilirubin Urine: NEGATIVE
Glucose, UA: NEGATIVE mg/dL
Ketones, ur: NEGATIVE mg/dL
Nitrite: NEGATIVE
Protein, ur: 100 mg/dL — AB
Specific Gravity, Urine: 1.016 (ref 1.005–1.030)
pH: 6 (ref 5.0–8.0)

## 2018-12-22 LAB — COMPREHENSIVE METABOLIC PANEL
ALT: 28 U/L (ref 0–44)
AST: 54 U/L — ABNORMAL HIGH (ref 15–41)
Albumin: 3.9 g/dL (ref 3.5–5.0)
Alkaline Phosphatase: 54 U/L (ref 38–126)
Anion gap: 18 — ABNORMAL HIGH (ref 5–15)
BUN: 35 mg/dL — ABNORMAL HIGH (ref 8–23)
CO2: 19 mmol/L — ABNORMAL LOW (ref 22–32)
Calcium: 9.7 mg/dL (ref 8.9–10.3)
Chloride: 98 mmol/L (ref 98–111)
Creatinine, Ser: 1.46 mg/dL — ABNORMAL HIGH (ref 0.61–1.24)
GFR calc Af Amer: 55 mL/min — ABNORMAL LOW (ref >60)
GFR calc non Af Amer: 48 mL/min — ABNORMAL LOW (ref >60)
Glucose, Bld: 145 mg/dL — ABNORMAL HIGH (ref 70–99)
Potassium: 4 mmol/L (ref 3.5–5.1)
Sodium: 135 mmol/L (ref 135–145)
Total Bilirubin: 1.4 mg/dL — ABNORMAL HIGH (ref 0.3–1.2)
Total Protein: 7.9 g/dL (ref 6.5–8.1)

## 2018-12-22 LAB — CBC WITH DIFFERENTIAL/PLATELET
Abs Immature Granulocytes: 0.44 10*3/uL — ABNORMAL HIGH (ref 0.00–0.07)
Basophils Absolute: 0 10*3/uL (ref 0.0–0.1)
Basophils Relative: 0 %
Eosinophils Absolute: 0 10*3/uL (ref 0.0–0.5)
Eosinophils Relative: 0 %
HCT: 38.7 % — ABNORMAL LOW (ref 39.0–52.0)
Hemoglobin: 12.6 g/dL — ABNORMAL LOW (ref 13.0–17.0)
Immature Granulocytes: 3 %
Lymphocytes Relative: 3 %
Lymphs Abs: 0.4 10*3/uL — ABNORMAL LOW (ref 0.7–4.0)
MCH: 29.8 pg (ref 26.0–34.0)
MCHC: 32.6 g/dL (ref 30.0–36.0)
MCV: 91.5 fL (ref 80.0–100.0)
Monocytes Absolute: 1.3 10*3/uL — ABNORMAL HIGH (ref 0.1–1.0)
Monocytes Relative: 9 %
Neutro Abs: 12.5 10*3/uL — ABNORMAL HIGH (ref 1.7–7.7)
Neutrophils Relative %: 85 %
Platelets: 124 10*3/uL — ABNORMAL LOW (ref 150–400)
RBC: 4.23 MIL/uL (ref 4.22–5.81)
RDW: 14.2 % (ref 11.5–15.5)
WBC: 14.6 10*3/uL — ABNORMAL HIGH (ref 4.0–10.5)
nRBC: 0 % (ref 0.0–0.2)

## 2018-12-22 LAB — PROTIME-INR
INR: 1.5 — ABNORMAL HIGH (ref 0.8–1.2)
Prothrombin Time: 17.7 seconds — ABNORMAL HIGH (ref 11.4–15.2)

## 2018-12-22 LAB — LACTIC ACID, PLASMA
Lactic Acid, Venous: 3.7 mmol/L (ref 0.5–1.9)
Lactic Acid, Venous: 1.8 mmol/L (ref 0.5–1.9)

## 2018-12-22 LAB — SARS CORONAVIRUS 2 BY RT PCR (HOSPITAL ORDER, PERFORMED IN ~~LOC~~ HOSPITAL LAB): SARS Coronavirus 2: NEGATIVE

## 2018-12-22 LAB — APTT: aPTT: 46 seconds — ABNORMAL HIGH (ref 24–36)

## 2018-12-22 MED ORDER — OXYCODONE HCL 5 MG PO TABS
5.0000 mg | ORAL_TABLET | ORAL | Status: DC | PRN
  Administered 2018-12-23 – 2018-12-25 (×8): 5 mg via ORAL
  Filled 2018-12-22 (×8): qty 1

## 2018-12-22 MED ORDER — SODIUM CHLORIDE 0.9 % IV SOLN
INTRAVENOUS | Status: DC
  Administered 2018-12-22 – 2018-12-24 (×3): via INTRAVENOUS

## 2018-12-22 MED ORDER — ONDANSETRON HCL 4 MG/2ML IJ SOLN
4.0000 mg | Freq: Once | INTRAMUSCULAR | Status: AC
  Administered 2018-12-22: 4 mg via INTRAVENOUS
  Filled 2018-12-22: qty 2

## 2018-12-22 MED ORDER — IOHEXOL 300 MG/ML  SOLN
100.0000 mL | Freq: Once | INTRAMUSCULAR | Status: AC | PRN
  Administered 2018-12-22: 16:00:00 100 mL via INTRAVENOUS

## 2018-12-22 MED ORDER — VANCOMYCIN HCL 10 G IV SOLR
2000.0000 mg | Freq: Once | INTRAVENOUS | Status: AC
  Administered 2018-12-22: 2000 mg via INTRAVENOUS
  Filled 2018-12-22: qty 2000

## 2018-12-22 MED ORDER — ACETAMINOPHEN 325 MG PO TABS
650.0000 mg | ORAL_TABLET | Freq: Four times a day (QID) | ORAL | Status: DC | PRN
  Administered 2018-12-23: 17:00:00 650 mg via ORAL
  Filled 2018-12-22: qty 2

## 2018-12-22 MED ORDER — ACETAMINOPHEN 500 MG PO TABS
1000.0000 mg | ORAL_TABLET | Freq: Once | ORAL | Status: AC
  Administered 2018-12-22: 1000 mg via ORAL
  Filled 2018-12-22: qty 2

## 2018-12-22 MED ORDER — ACETAMINOPHEN 650 MG RE SUPP: 650.0000 mg | Freq: Four times a day (QID) | RECTAL | Status: DC | PRN

## 2018-12-22 MED ORDER — SODIUM CHLORIDE 0.9 % IV SOLN
2.0000 g | Freq: Once | INTRAVENOUS | Status: AC
  Administered 2018-12-22: 13:00:00 2 g via INTRAVENOUS
  Filled 2018-12-22: qty 2

## 2018-12-22 MED ORDER — SODIUM CHLORIDE 0.9 % IV SOLN
2.0000 g | Freq: Two times a day (BID) | INTRAVENOUS | Status: DC
  Administered 2018-12-23: 01:00:00 2 g via INTRAVENOUS
  Filled 2018-12-22 (×2): qty 2

## 2018-12-22 MED ORDER — SODIUM CHLORIDE (PF) 0.9 % IJ SOLN
INTRAMUSCULAR | Status: AC
  Filled 2018-12-22: qty 50

## 2018-12-22 MED ORDER — VANCOMYCIN HCL IN DEXTROSE 1-5 GM/200ML-% IV SOLN
1000.0000 mg | Freq: Once | INTRAVENOUS | Status: DC
  Filled 2018-12-22: qty 200

## 2018-12-22 MED ORDER — METRONIDAZOLE IN NACL 5-0.79 MG/ML-% IV SOLN
500.0000 mg | Freq: Once | INTRAVENOUS | Status: AC
  Administered 2018-12-22: 13:00:00 500 mg via INTRAVENOUS
  Filled 2018-12-22: qty 100

## 2018-12-22 MED ORDER — ONDANSETRON HCL 4 MG PO TABS
4.0000 mg | ORAL_TABLET | Freq: Four times a day (QID) | ORAL | Status: DC | PRN
  Administered 2018-12-24: 4 mg via ORAL
  Filled 2018-12-22: qty 1

## 2018-12-22 MED ORDER — POLYETHYLENE GLYCOL 3350 17 G PO PACK
17.0000 g | PACK | Freq: Every day | ORAL | Status: DC | PRN
  Administered 2018-12-24 – 2018-12-25 (×2): 17 g via ORAL
  Filled 2018-12-22: qty 1

## 2018-12-22 MED ORDER — ONDANSETRON HCL 4 MG/2ML IJ SOLN: 4.0000 mg | Freq: Four times a day (QID) | INTRAMUSCULAR | Status: DC | PRN

## 2018-12-22 MED ORDER — GABAPENTIN 100 MG PO CAPS
100.0000 mg | ORAL_CAPSULE | Freq: Three times a day (TID) | ORAL | Status: DC
  Administered 2018-12-23 – 2018-12-25 (×7): 100 mg via ORAL
  Filled 2018-12-22 (×7): qty 1

## 2018-12-22 MED ORDER — HEPARIN SODIUM (PORCINE) 5000 UNIT/ML IJ SOLN
5000.0000 [IU] | Freq: Three times a day (TID) | INTRAMUSCULAR | Status: DC
  Administered 2018-12-22 – 2018-12-25 (×8): 5000 [IU] via SUBCUTANEOUS
  Filled 2018-12-22 (×9): qty 1

## 2018-12-22 MED ORDER — VANCOMYCIN HCL 10 G IV SOLR
1250.0000 mg | INTRAVENOUS | Status: DC
  Administered 2018-12-23: 14:00:00 1250 mg via INTRAVENOUS
  Filled 2018-12-22 (×2): qty 1250

## 2018-12-22 MED ORDER — SODIUM CHLORIDE 0.9 % IV BOLUS (SEPSIS)
2000.0000 mL | Freq: Once | INTRAVENOUS | Status: AC
  Administered 2018-12-22: 13:00:00 2000 mL via INTRAVENOUS
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
## 2018-12-22 NOTE — ED Notes (Signed)
Failed attempt to collect Blood Cultures. RN notified.
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
## 2018-12-22 NOTE — Progress Notes (Signed)
Pharmacy Antibiotic Note  Billy Rasmussen is a 73 y.o. male admitted on 12/22/2018 with sepsis.  Pharmacy has been consulted for vanc/cefepime dosing.  Plan:  Vanc 2g x 1 given at 1400. Start vanc 1250mg  IV q24 thereafter - goal AUC 400-550  Cefepime 2g IV q12 per current renal function   Height: 5\' 6"  (167.6 cm) Weight: 215 lb (97.5 kg) IBW/kg (Calculated) : 63.8  Temp (24hrs), Avg:101.3 F (38.5 C), Min:99.3 F (37.4 C), Max:103.2 F (39.6 C)  Recent Labs  Lab 12/22/18 1257 12/22/18 1258 12/22/18 1516  WBC  --  14.6*  --   CREATININE  --  1.46*  --   LATICACIDVEN 3.7*  --  1.8    Estimated Creatinine Clearance: 50.7 mL/min (A) (by C-G formula based on SCr of 1.46 mg/dL (H)).    No Known Allergies    Thank you for allowing pharmacy to be a part of this patient's care.  Kara Mead 12/22/2018 5:15 PM

## 2018-12-22 NOTE — ED Notes (Signed)
This nurse noted pts BP to be decreased, admitting provider made aware.

## 2018-12-22 NOTE — Progress Notes (Signed)
A consult was received from an ED physician for vanc/cefepime per pharmacy dosing.  The patient's profile has been reviewed for ht/wt/allergies/indication/available labs.   A one time order has been placed for vanc 2g and cefepime 2g.  Further antibiotics/pharmacy consults should be ordered by admitting physician if indicated.                       Thank you, Kara Mead 12/22/2018  1:17 PM

## 2018-12-22 NOTE — ED Triage Notes (Signed)
Pt presents to ED via POV for c/o fever and body aches and general malaise x1 week. Pt states he has surgery on June 10th to have his bladder removed. Pt denies any exposure to COVID.
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
## 2018-12-23 ENCOUNTER — Encounter (HOSPITAL_COMMUNITY): Payer: Self-pay

## 2018-12-23 DIAGNOSIS — N390 Urinary tract infection, site not specified: Secondary | ICD-10-CM | POA: Diagnosis present

## 2018-12-23 DIAGNOSIS — A419 Sepsis, unspecified organism: Secondary | ICD-10-CM

## 2018-12-23 DIAGNOSIS — K219 Gastro-esophageal reflux disease without esophagitis: Secondary | ICD-10-CM

## 2018-12-23 DIAGNOSIS — N10 Acute tubulo-interstitial nephritis: Secondary | ICD-10-CM

## 2018-12-23 DIAGNOSIS — R6520 Severe sepsis without septic shock: Secondary | ICD-10-CM

## 2018-12-23 LAB — BASIC METABOLIC PANEL
Anion gap: 8 (ref 5–15)
BUN: 26 mg/dL — ABNORMAL HIGH (ref 8–23)
CO2: 21 mmol/L — ABNORMAL LOW (ref 22–32)
Calcium: 8.5 mg/dL — ABNORMAL LOW (ref 8.9–10.3)
Chloride: 106 mmol/L (ref 98–111)
Creatinine, Ser: 0.97 mg/dL (ref 0.61–1.24)
GFR calc Af Amer: >60 mL/min (ref >60)
GFR calc non Af Amer: >60 mL/min (ref >60)
Glucose, Bld: 106 mg/dL — ABNORMAL HIGH (ref 70–99)
Potassium: 3.7 mmol/L (ref 3.5–5.1)
Sodium: 135 mmol/L (ref 135–145)

## 2018-12-23 LAB — CBC
HCT: 31.7 % — ABNORMAL LOW (ref 39.0–52.0)
Hemoglobin: 10 g/dL — ABNORMAL LOW (ref 13.0–17.0)
MCH: 29.9 pg (ref 26.0–34.0)
MCHC: 31.5 g/dL (ref 30.0–36.0)
MCV: 94.9 fL (ref 80.0–100.0)
Platelets: 92 10*3/uL — ABNORMAL LOW (ref 150–400)
RBC: 3.34 MIL/uL — ABNORMAL LOW (ref 4.22–5.81)
RDW: 14.3 % (ref 11.5–15.5)
WBC: 8.7 10*3/uL (ref 4.0–10.5)
nRBC: 0 % (ref 0.0–0.2)

## 2018-12-23 MED ORDER — SENNOSIDES-DOCUSATE SODIUM 8.6-50 MG PO TABS
1.0000 | ORAL_TABLET | Freq: Two times a day (BID) | ORAL | Status: DC
  Administered 2018-12-23 – 2018-12-25 (×5): 1 via ORAL
  Filled 2018-12-23 (×5): qty 1

## 2018-12-23 MED ORDER — PANTOPRAZOLE SODIUM 40 MG PO TBEC
40.0000 mg | DELAYED_RELEASE_TABLET | Freq: Every day | ORAL | Status: DC
  Administered 2018-12-24 – 2018-12-25 (×2): 40 mg via ORAL
  Filled 2018-12-23 (×2): qty 1

## 2018-12-23 MED ORDER — SODIUM CHLORIDE 0.9 % IV SOLN
2.0000 g | Freq: Three times a day (TID) | INTRAVENOUS | Status: DC
  Administered 2018-12-23 – 2018-12-24 (×3): 2 g via INTRAVENOUS
  Filled 2018-12-23 (×4): qty 2

## 2018-12-23 NOTE — Progress Notes (Addendum)
PROGRESS NOTE    Billy CHARLOT  AST:[id number] DOB: Aug 20, 1945 DOA: 12/22/2018 PCP: Terald Sleeper, PA-C    Brief Narrative: Per Dr Dimitri Ped is a 73 y.o. male with medical history significant of bladder cancer status post radical cystoprostatectomy on 11/09/2018 with ileal conduit and lymph node resection who presents to the hospital for fever, weakness, nausea decreased appetite and right flank and back pain.  According to the patient started about 10 days ago when he started not feeling well, he had intermittent temperature, he states it was up to 105 at home.  He had no nausea or vomiting but really no appetite.  Bowel habits are normal.  He has noted dark urine in his pouch for last few days.  Patient denies any headache, dizziness, lightheadedness or syncopal episode.  No chest pain or shortness of breath.  No cough or cold symptoms or flulike symptoms.  Right flank pain and back pain are mild in intensity with no radiation. Patient visited his urology office few days ago and was started on antibiotics?  Unable to verify which antibiotics and was told to come to ER if worse.  He had fever today so he came back to ER he is intermittently taking his blood pressure medications.. ED Course: Blood pressures are low normal, map is adequate.  Patient was tachycardic.  Initial lactic acid was 3.7.  Blood work with evidence of leukocytosis, acute renal failure and hemoconcentration.  Urinalysis grossly abnormal, however it is from ileal conduit. CT scan shows postoperative changes as well hydronephrosis on the left side. Urology notified by ER.  Assessment & Plan:   Principal Problem:   Sepsis ([place]) Active Problems:   HTN (hypertension)   Malignant neoplasm of urinary bladder (HCC)   AKI (acute kidney injury) ([place])   1 sepsis secondary to pyelonephritis/complicated UTI Patient with recent instrumentation status post radical cystoprostatectomy 11/09/2018 with ileal conduit and  lymph node resection, status post band stent removal 12/15/2018 and patient then placed empirically on Keflex.  Second stent was to be removed the week of 7/29 however per urology came out on its own prior and he presented to the urology clinic on 12/21/2018 with fevers, chills, malaise, poor oral intake was given Rocephin and started on empiric cefpodoxime in the clinic.  Urine cultures pending.  Patient presented back to the ED with temperatures at home as high as 105.  Patient noted to have a temperature as high as 103.2 and noted to be tachycardic with heart rate of 142 on presentation.  Patient noted to have a leukocytosis.  Urinalysis done on admission concerning for UTI.  Patient pancultured results pending.  Patient however received antibiotics prior to blood cultures and urine cultures obtained during this admission.  Patient started empirically on IV vancomycin IV cefepime pending results.  Urology consulted who are recommending treating pyelonephritis for 14-day course with culture specific antibiotics.  Will speak with urology to see whether the able to track down culture results which were obtained in the clinic on 12/21/2018.  Continue supportive care.  Follow.  2.  Acute renal failure Likely secondary to prerenal azotemia in the setting of ACE inhibitor and diuretic and problem #1.  ACE inhibitor and diuretic on hold.  Renal function improving with hydration.  Continue IV fluids.  Follow.  3.  Hypertension Antihypertensive medications on hold due to concerns for sepsis and risk of hypotension.  Patient on IV fluids.  Blood pressure improving.  Follow.  4.  Gastroesophageal  reflux disease Place on PPI.   Addendum: Was updated by Dr. Jeffie Pollock that urine cultures from 12/21/2018 with greater than 100,000 colonies of enterococcus sensitive to ampicillin, ciprofloxacin, Levaquin, daptomycin, nitrofurantoin, linezolid, vancomycin.  Resistant to tetracycline.  Per urology if no significant  improvement will need a repeat renal ultrasound to follow-up on hydronephrosis and if still persistent will need IR for percutaneous tube placement.  Continue IV vancomycin and cefepime pending cultures.  If blood cultures continue to remain negative and if patient continues to improve could likely narrow IV antibiotics to IV ampicillin and on discharge could likely transition to oral amoxicillin to complete a 14-day course of antibiotic treatment.   DVT prophylaxis: Heparin Code Status: Full Family Communication: Updated patient.  No family at bedside. Disposition Plan: Likely home when clinically improved and cultures have resulted.   Consultants:   Urology: Dr. Anthony Sar 12/23/2018  Procedures:  CT abdomen and pelvis 12/22/2018    Antimicrobials:   IV cefepime 12/22/2018  IV vancomycin 12/22/2018   Subjective: Patient in bed.  States he is feeling better than on admission.  Denies any chest pain or shortness of breath.  Denies any chills.  Objective: Vitals:   12/22/18 1730 12/22/18 1832 12/22/18 1947 12/23/18 0522  BP: (!) 97/47 98/65 113/68 135/79  Pulse: 86 81 81 95  Resp: 20 16 18 18   Temp:  98.3 F (36.8 C) 98.1 F (36.7 C) 98.3 F (36.8 C)  TempSrc:  Oral Oral   SpO2: 98% 98% 99% 96%  Weight:      Height:        Intake/Output Summary (Last 24 hours) at 12/23/2018 1147 Last data filed at 12/23/2018 0830 Gross per 24 hour  Intake 5713.34 ml  Output 1875 ml  Net 3838.34 ml   Filed Weights   12/22/18 1243  Weight: 97.5 kg    Examination:  General exam: Appears calm and comfortable  Respiratory system: Clear to auscultation. Respiratory effort normal. Cardiovascular system: S1 & S2 heard, RRR. No JVD, murmurs, rubs, gallops or clicks. No pedal edema. Gastrointestinal system: Abdomen is nondistended, soft and nontender. No organomegaly or masses felt. Normal bowel sounds heard.  Right lower quadrant urostomy with yellow urine noted. Central nervous system:  Alert and oriented. No focal neurological deficits. Extremities: Symmetric 5 x 5 power. Skin: No rashes, lesions or ulcers Psychiatry: Judgement and insight appear normal. Mood & affect appropriate.     Data Reviewed: I have personally reviewed following labs and imaging studies  CBC: Recent Labs  Lab 12/22/18 1258 12/23/18 0558  WBC 14.6* 8.7  NEUTROABS 12.5*  --   HGB 12.6* 10.0*  HCT 38.7* 31.7*  MCV 91.5 94.9  PLT 124* 92*   Basic Metabolic Panel: Recent Labs  Lab 12/22/18 1258 12/23/18 0558  NA 135 135  K 4.0 3.7  CL 98 106  CO2 19* 21*  GLUCOSE 145* 106*  BUN 35* 26*  CREATININE 1.46* 0.97  CALCIUM 9.7 8.5*   GFR: Estimated Creatinine Clearance: 76.4 mL/min (by C-G formula based on SCr of 0.97 mg/dL). Liver Function Tests: Recent Labs  Lab 12/22/18 1258  AST 54*  ALT 28  ALKPHOS 54  BILITOT 1.4*  PROT 7.9  ALBUMIN 3.9   No results for input(s): LIPASE, AMYLASE in the last 168 hours. No results for input(s): AMMONIA in the last 168 hours. Coagulation Profile: Recent Labs  Lab 12/22/18 1258  INR 1.5*   Cardiac Enzymes: No results for input(s): CKTOTAL, CKMB, CKMBINDEX, TROPONINI in the  last 168 hours. BNP (last 3 results) No results for input(s): PROBNP in the last 8760 hours. HbA1C: No results for input(s): HGBA1C in the last 72 hours. CBG: No results for input(s): GLUCAP in the last 168 hours. Lipid Profile: No results for input(s): CHOL, HDL, LDLCALC, TRIG, CHOLHDL, LDLDIRECT in the last 72 hours. Thyroid Function Tests: No results for input(s): TSH, T4TOTAL, FREET4, T3FREE, THYROIDAB in the last 72 hours. Anemia Panel: No results for input(s): VITAMINB12, FOLATE, FERRITIN, TIBC, IRON, RETICCTPCT in the last 72 hours. Sepsis Labs: Recent Labs  Lab 12/22/18 1257 12/22/18 1516  LATICACIDVEN 3.7* 1.8    Recent Results (from the past 240 hour(s))  Culture, blood (Routine x 2)     Status: None (Preliminary result)   Collection Time:  12/22/18 12:44 PM   Specimen: BLOOD LEFT FOREARM  Result Value Ref Range Status   Specimen Description   Final    BLOOD LEFT FOREARM Performed at [facility], [place] [address] [place], [place] [address]    Special Requests   Final    BOTTLES DRAWN AEROBIC AND ANAEROBIC Blood Culture results may not be optimal due to an excessive volume of blood received in culture bottles Performed at [facility] [address], [place], [place] [address]    Culture   Final    NO GROWTH < 24 HOURS Performed at [facility] [address], [place], [place] [address]    Report Status PENDING  Incomplete  Urine culture     Status: None (Preliminary result)   Collection Time: 12/22/18 12:44 PM   Specimen: In/Out Cath Urine  Result Value Ref Range Status   Specimen Description   Final    IN/OUT CATH URINE Performed at [facility] [address], [place], [place] [address]    Special Requests   Final    NONE Performed at [facility], [place] [address], [place], [place] [address]    Culture   Final    CULTURE REINCUBATED FOR BETTER GROWTH Performed at [facility], [place] [address], [place], [place] [address]    Report Status PENDING  Incomplete  Culture, blood (Routine x 2)     Status: None (Preliminary result)   Collection Time: 12/22/18 12:49 PM   Specimen: BLOOD LEFT FOREARM  Result Value Ref Range Status   Specimen Description   Final    BLOOD LEFT FOREARM Performed at [facility], [place] [address], [place], [place] [address]    Special Requests   Final    BOTTLES DRAWN AEROBIC AND ANAEROBIC Blood Culture results may not be optimal due to an inadequate volume of blood received in culture bottles Performed at [facility] [address], [place], [place] [address]    Culture   Final    NO GROWTH < 24 HOURS Performed at [facility] [address] [place], [place] [address]     Report Status PENDING  Incomplete  SARS Coronavirus 2 (CEPHEID- Performed in [facility] hospital lab), Hosp Order     Status: None   Collection Time: 12/22/18  1:03 PM   Specimen: Nasopharyngeal Swab  Result Value Ref Range Status   SARS Coronavirus 2 NEGATIVE NEGATIVE Final    Comment: (NOTE) If result is NEGATIVE SARS-CoV-2 target nucleic acids are NOT DETECTED. The SARS-CoV-2 RNA is generally detectable in upper and lower  respiratory specimens during the acute phase of infection. The lowest  concentration of SARS-CoV-2 viral copies this assay can detect is 250  copies / mL. A negative result does not preclude SARS-CoV-2 infection  and should not be used as the sole basis for treatment or other  patient management decisions.  A negative result may occur with  improper specimen collection / handling, submission of specimen other  than nasopharyngeal swab, presence of viral mutation(s) within the  areas targeted by this assay, and inadequate number of viral copies  (<250 copies / mL). A negative result must be combined with clinical  observations, patient history, and epidemiological information. If result is POSITIVE SARS-CoV-2 target nucleic acids are DETECTED. The SARS-CoV-2 RNA is generally detectable in upper and lower  respiratory specimens dur ing the acute phase of infection.  Positive  results are indicative of active infection with SARS-CoV-2.  Clinical  correlation with patient history and other diagnostic information is  necessary to determine patient infection status.  Positive results do  not rule out bacterial infection or co-infection with other viruses. If result is PRESUMPTIVE POSTIVE SARS-CoV-2 nucleic acids MAY BE PRESENT.   A presumptive positive result was obtained on the submitted specimen  and confirmed on repeat testing.  While 2019 novel coronavirus  (SARS-CoV-2) nucleic acids may be present in the submitted sample  additional confirmatory testing may be  necessary for epidemiological  and / or clinical management purposes  to differentiate between  SARS-CoV-2 and other Sarbecovirus currently known to infect humans.  If clinically indicated additional testing with an alternate test  methodology [phone]) is advised. The SARS-CoV-2 RNA is generally  detectable in upper and lower respiratory sp ecimens during the acute  phase of infection. The expected result is Negative. Fact Sheet for Patients:  StrictlyIdeas.no Fact Sheet for Healthcare Providers: BankingDealers.co.za This test is not yet approved or cleared by the [place] FDA and has been authorized for detection and/or diagnosis of SARS-CoV-2 by FDA under an Emergency Use Authorization (EUA).  This EUA will remain in effect (meaning this test can be used) for the duration of the COVID-19 declaration under Section 564(b)(1) of the Act, 21 U.S.C. section 360bbb-3(b)(1), unless the authorization is terminated or revoked sooner. Performed at [facility], [place] [address], [place], [place] [address]          Radiology Studies: Ct Abdomen Pelvis W Contrast  Result Date: 12/22/2018 CLINICAL DATA:  Abdominal pain and fevers, history of prior cystectomy on 11/09/2018 EXAM: CT ABDOMEN AND PELVIS WITH CONTRAST TECHNIQUE: Multidetector CT imaging of the abdomen and pelvis was performed using the standard protocol following bolus administration of intravenous contrast. CONTRAST:  137mL OMNIPAQUE IOHEXOL 300 MG/ML  SOLN COMPARISON:  10/13/2018 FINDINGS: Lower chest: Lung bases are free of acute infiltrate or sizable effusion. No parenchymal nodules are noted. Hepatobiliary: Diffuse fatty infiltration of the liver is noted. The gallbladder is well distended with multiple gallstones within. No pericholecystic fluid or wall thickening is noted. Pancreas: Unremarkable. No pancreatic ductal dilatation or surrounding inflammatory  changes. Spleen: Normal in size without focal abnormality. Adrenals/Urinary Tract: Adrenal glands are within normal limits. The right kidney is well visualize without renal calculi or obstructive change. The left kidney demonstrates slight delay in opacification with respect to the right. Mild obstructive changes are noted no obstructing stone is identified. The dilatation may be related to some edema at the insertion site in the ileostomy. The bladder has been surgically removed consistent with the patient's given clinical history. Fluid is noted in the surgical bed which  measures approximately the 7.5 by the 6.1 cm in greatest dimension. It is slightly more prominent to the right of the midline. No air is noted or enhancement to suggest abscess in this may simply represent a postoperative seroma. In the right lower quadrant there is an ileostomy identified. Stomach/Bowel: Scattered diverticular change of the colon is noted. No diverticulitis is seen. The appendix is not visualized consistent with a prior surgical history. No obstructive changes of the small bowel are seen. Stomach is within normal limits. Vascular/Lymphatic: Aortic atherosclerosis. No enlarged abdominal or pelvic lymph nodes. Reproductive: Prostate has been surgically removed. Other: No abdominal wall hernia or abnormality. Musculoskeletal: No acute bony abnormality is seen. Degenerative changes of the lumbar spine are noted. IMPRESSION: Changes consistent with prior cystoprostatectomy with a fluid collection in the surgical bed likely representing a postop seroma. No findings to suggest focal abscess are identified. Dilatation of the left collecting system with delayed enhancement which extends to the level of the anastomosis with the ileal conduit. No stone is identified in this may represent edema at the anastomotic site. This is likely the etiology of the patient's abdominal pain. Cholelithiasis without complicating factors. Diverticulosis  without evidence of diverticulitis. Electronically Signed   By: Inez Catalina M.D.   On: 12/22/2018 16:30   Dg Chest Port 1 View  Result Date: 12/22/2018 CLINICAL DATA:  Fever, malaise EXAM: PORTABLE CHEST 1 VIEW COMPARISON:  02/25/2016 FINDINGS: The heart size and mediastinal contours are within normal limits. Both lungs are clear. Right shoulder arthroplasty. IMPRESSION: No active disease. Electronically Signed   By: Davina Poke M.D.   On: 12/22/2018 13:38        Scheduled Meds: . gabapentin  100 mg Oral TID  . heparin  5,000 Units Subcutaneous Q8H  . senna-docusate  1 tablet Oral BID   Continuous Infusions: . sodium chloride 125 mL/hr at 12/23/18 0300  . ceFEPime (MAXIPIME) IV 2 g (12/23/18 0922)  . vancomycin       LOS: 1 day    Time spent: 40 minutes    Irine Seal, MD [facility]  If 7PM-7AM, please contact night-coverage [email or web address] 12/23/2018, 11:47 AM

## 2018-12-23 NOTE — Progress Notes (Signed)
Pharmacy Antibiotic Note  Billy Rasmussen is a 73 y.o. male with hx high risk bladder cancer status post robotic cystoprostatectomy in June 2020.  He went to see his urologist on 7/22 with c/o fever, chills and poor appetite and was given a prescription for vantin.  His symptoms did not improve and he subsequently presented to the ED on 7/23 for further workup.  Patient's currently on vancomycin and cefepime for broad coverage.  Today, 12/23/2018: - Tmax 103.2 - wbc down wnl - scr down 0.97 (crcl~76) with NS at 125 ml/hr - all cultures have been negative this far  Plan: - continue vancomycin 1250 mg IV q24h forest AUC 454 - increase cefepime dose to 2 gm IV q8h - f/u with cx, renal function and plan for abx ________________________________________  Height: 5\' 6"  (167.6 cm) Weight: 215 lb (97.5 kg) IBW/kg (Calculated) : 63.8  Temp (24hrs), Avg:99.4 F (37.4 C), Min:98.1 F (36.7 C), Max:103.2 F (39.6 C)  Recent Labs  Lab 12/22/18 1257 12/22/18 1258 12/22/18 1516 12/23/18 0558  WBC  --  14.6*  --  8.7  CREATININE  --  1.46*  --  0.97  LATICACIDVEN 3.7*  --  1.8  --     Estimated Creatinine Clearance: 76.4 mL/min (by C-G formula based on SCr of 0.97 mg/dL).    No Known Allergies  Antimicrobials this admission: 7/21 flagyl x 1 7/23 vanc >>  7/23 cefepime >>   Dose adjustments this admission: --  Microbiology results: 7/23 BCx x2:  7/23 ucx: Thank you for allowing pharmacy to be a part of this patient's care.  Lynelle Doctor 12/23/2018 8:30 AM

## 2018-12-24 LAB — BASIC METABOLIC PANEL
Anion gap: 5 (ref 5–15)
BUN: 20 mg/dL (ref 8–23)
CO2: 23 mmol/L (ref 22–32)
Calcium: 8.6 mg/dL — ABNORMAL LOW (ref 8.9–10.3)
Chloride: 108 mmol/L (ref 98–111)
Creatinine, Ser: 0.85 mg/dL (ref 0.61–1.24)
GFR calc Af Amer: >60 mL/min (ref >60)
GFR calc non Af Amer: >60 mL/min (ref >60)
Glucose, Bld: 92 mg/dL (ref 70–99)
Potassium: 3.5 mmol/L (ref 3.5–5.1)
Sodium: 136 mmol/L (ref 135–145)

## 2018-12-24 LAB — CBC WITH DIFFERENTIAL/PLATELET
Abs Immature Granulocytes: 0.04 10*3/uL (ref 0.00–0.07)
Basophils Absolute: 0 10*3/uL (ref 0.0–0.1)
Basophils Relative: 0 %
Eosinophils Absolute: 0.1 10*3/uL (ref 0.0–0.5)
Eosinophils Relative: 1 %
HCT: 31 % — ABNORMAL LOW (ref 39.0–52.0)
Hemoglobin: 9.7 g/dL — ABNORMAL LOW (ref 13.0–17.0)
Immature Granulocytes: 1 %
Lymphocytes Relative: 11 %
Lymphs Abs: 0.6 10*3/uL — ABNORMAL LOW (ref 0.7–4.0)
MCH: 29.7 pg (ref 26.0–34.0)
MCHC: 31.3 g/dL (ref 30.0–36.0)
MCV: 94.8 fL (ref 80.0–100.0)
Monocytes Absolute: 0.4 10*3/uL (ref 0.1–1.0)
Monocytes Relative: 9 %
Neutro Abs: 3.7 10*3/uL (ref 1.7–7.7)
Neutrophils Relative %: 78 %
Platelets: 99 10*3/uL — ABNORMAL LOW (ref 150–400)
RBC: 3.27 MIL/uL — ABNORMAL LOW (ref 4.22–5.81)
RDW: 14.2 % (ref 11.5–15.5)
WBC: 4.8 10*3/uL (ref 4.0–10.5)
nRBC: 0 % (ref 0.0–0.2)

## 2018-12-24 LAB — URINE CULTURE: Culture: 60000 — AB

## 2018-12-24 MED ORDER — POLYETHYLENE GLYCOL 3350 17 G PO PACK: 17.0000 g | PACK | Freq: Every day | ORAL | Status: DC

## 2018-12-24 MED ORDER — BISACODYL 10 MG RE SUPP
10.0000 mg | Freq: Every day | RECTAL | Status: DC | PRN
  Administered 2018-12-24: 15:00:00 10 mg via RECTAL
  Filled 2018-12-24: qty 1

## 2018-12-24 MED ORDER — HYDROCHLOROTHIAZIDE 12.5 MG PO CAPS
12.5000 mg | ORAL_CAPSULE | Freq: Every day | ORAL | Status: DC
  Administered 2018-12-25: 09:00:00 12.5 mg via ORAL
  Filled 2018-12-24: qty 1

## 2018-12-24 MED ORDER — KETOROLAC TROMETHAMINE 15 MG/ML IJ SOLN
15.0000 mg | Freq: Once | INTRAMUSCULAR | Status: AC
  Administered 2018-12-24: 15 mg via INTRAVENOUS
  Filled 2018-12-24: qty 1

## 2018-12-24 MED ORDER — VANCOMYCIN HCL IN DEXTROSE 750-5 MG/150ML-% IV SOLN
750.0000 mg | Freq: Two times a day (BID) | INTRAVENOUS | Status: DC
  Administered 2018-12-24 – 2018-12-25 (×3): 750 mg via INTRAVENOUS
  Filled 2018-12-24 (×3): qty 150

## 2018-12-24 MED ORDER — POLYETHYLENE GLYCOL 3350 17 G PO PACK
17.0000 g | PACK | Freq: Every day | ORAL | Status: DC
  Administered 2018-12-25: 09:00:00 17 g via ORAL
  Filled 2018-12-24: qty 1

## 2018-12-24 MED ORDER — POTASSIUM CHLORIDE CRYS ER 20 MEQ PO TBCR
40.0000 meq | EXTENDED_RELEASE_TABLET | Freq: Once | ORAL | Status: AC
  Administered 2018-12-24: 11:00:00 40 meq via ORAL
  Filled 2018-12-24: qty 2

## 2018-12-24 MED ORDER — SODIUM CHLORIDE 0.9 % IV SOLN
1.0000 g | Freq: Four times a day (QID) | INTRAVENOUS | Status: DC
  Administered 2018-12-24: 1 g via INTRAVENOUS
  Filled 2018-12-24 (×3): qty 1000

## 2018-12-24 NOTE — Progress Notes (Addendum)
PROGRESS NOTE    Billy Rasmussen  MWN:[id number] DOB: June 11, 1945 DOA: 12/22/2018 PCP: Terald Sleeper, PA-C    Brief Narrative: Per Dr Dimitri Ped is a 73 y.o. male with medical history significant of bladder cancer status post radical cystoprostatectomy on 11/09/2018 with ileal conduit and lymph node resection who presents to the hospital for fever, weakness, nausea decreased appetite and right flank and back pain.  According to the patient started about 10 days ago when he started not feeling well, he had intermittent temperature, he states it was up to 105 at home.  He had no nausea or vomiting but really no appetite.  Bowel habits are normal.  He has noted dark urine in his pouch for last few days.  Patient denies any headache, dizziness, lightheadedness or syncopal episode.  No chest pain or shortness of breath.  No cough or cold symptoms or flulike symptoms.  Right flank pain and back pain are mild in intensity with no radiation. Patient visited his urology office few days ago and was started on antibiotics?  Unable to verify which antibiotics and was told to come to ER if worse.  He had fever today so he came back to ER he is intermittently taking his blood pressure medications.. ED Course: Blood pressures are low normal, map is adequate.  Patient was tachycardic.  Initial lactic acid was 3.7.  Blood work with evidence of leukocytosis, acute renal failure and hemoconcentration.  Urinalysis grossly abnormal, however it is from ileal conduit. CT scan shows postoperative changes as well hydronephrosis on the left side. Urology notified by ER.  Assessment & Plan:   Principal Problem:   Sepsis ([place]) Active Problems:   HTN (hypertension)   Malignant neoplasm of urinary bladder (HCC)   AKI (acute kidney injury) ([place])   Urinary tract infection without hematuria   Acute pyelonephritis   Gastroesophageal reflux disease   1 sepsis secondary to pyelonephritis/complicated UTI, POA  Patient with recent instrumentation status post radical cystoprostatectomy 11/09/2018 with ileal conduit and lymph node resection, status post band stent removal 12/15/2018 and patient then placed empirically on Keflex.  Second stent was to be removed the week of 7/29 however per urology came out on its own prior and he presented to the urology clinic on 12/21/2018 with fevers, chills, malaise, poor oral intake was given Rocephin and started on empiric cefpodoxime in the clinic.  Urine cultures pending.  Patient presented back to the ED with temperatures at home as high as 105.  Patient noted to have a temperature as high as 103.2 and noted to be tachycardic with heart rate of 142 on presentation.  Patient noted to have a leukocytosis.  Urinalysis done on admission concerning for UTI.  Patient pancultured results pending.  Patient however received antibiotics prior to blood cultures and urine cultures obtained during this admission.  Patient started empirically on IV vancomycin IV cefepime pending results.  Urology consulted who are recommending treating pyelonephritis for 14-day course with culture specific antibiotics.  Was updated by Dr. Jeffie Pollock on 12/23/2018, that urine cultures from 12/21/2018 with greater than 100,000 colonies of enterococcus sensitive to ampicillin, ciprofloxacin, Levaquin, daptomycin, nitrofurantoin, linezolid, vancomycin.  Resistant to tetracycline.  Patient was maintained on IV vancomycin and IV cefepime. Patient currently afebrile with normal white count and clinical improvement.  Blood cultures with no growth to date.  Urine cultures with 60,000 colonies of Staphylococcus hemolyticus and 50,000 colonies of Enterococcus faecalis.  Initial plan was to transition patient to IV  ampicillin which she received a dose of this morning however due to Staphylococcus hemolyticus as well as Enterococcus faecalis in recent urine cultures will continue patient on IV vancomycin and discontinue IV cefepime  and IV ampicillin.  Once patient is stable to be discharged could likely transition to oral Zyvox to complete a 10-day course of antibiotic treatment.  Discussed with ID. Urology following and appreciate their input and recommendations.  2.  Acute renal failure Likely secondary to prerenal azotemia in the setting of ACE inhibitor and diuretic and problem #1.  ACE inhibitor and diuretic on hold.  Renal function improving with hydration.  Saline lock IV fluids.  Follow.   3.  Hypertension Antihypertensive medications on hold due to concerns for sepsis and risk of hypotension.  Blood pressure improving.  Follow.  4.  Gastroesophageal reflux disease Continue PPI.    5.  Constipation Patient with complaints of constipation.  Patient stated received a dose of MiraLAX.  Continue Senokot-S twice daily.  Patient placed on Dulcolax suppository as needed per urology.  Will order soapsuds enema x1.    DVT prophylaxis: Heparin Code Status: Full Family Communication: Updated patient.  No family at bedside. Disposition Plan: Likely home when clinically improved and cultures have resulted hopefully in the next 24 to 48 hours..   Consultants:   Urology: Dr. Anthony Sar 12/23/2018  Procedures:  CT abdomen and pelvis 12/22/2018    Antimicrobials:   IV cefepime 12/22/2018>>>>>> 12/24/2018  IV vancomycin 12/22/2018  IV ampicillin 7/25/ 20201 dose   Subjective: Patient sitting up on the side of the bed eating his lunch.  States he is feeling better today.  Complain of constipation.  No chest pain.  No shortness of breath.    Objective: Vitals:   12/23/18 0522 12/23/18 1344 12/23/18 2121 12/24/18 0534  BP: 135/79 139/84 (!) 155/83 (!) 152/92  Pulse: 95 91 79 78  Resp: 18 20 16 17   Temp: 98.3 F (36.8 C) 98 F (36.7 C) 98.9 F (37.2 C) 98.3 F (36.8 C)  TempSrc:  Oral Oral   SpO2: 96% 97% 98% 98%  Weight:      Height:        Intake/Output Summary (Last 24 hours) at 12/24/2018 1226 Last  data filed at 12/24/2018 2595 Gross per 24 hour  Intake -  Output 1650 ml  Net -1650 ml   Filed Weights   12/22/18 1243  Weight: 97.5 kg    Examination:  General exam: NAD Respiratory system: Lungs clear to auscultation bilaterally.  No wheezes, no crackles, no rhonchi.  Respiratory effort normal. Cardiovascular system: Regular rate rhythm no murmurs rubs or gallops.  No JVD.  No lower extremity edema.  Gastrointestinal system: Abdomen is soft, nontender, nondistended, positive bowel sounds.  No rebound.  No guarding. Right lower quadrant urostomy with yellow urine noted. Central nervous system: Alert and oriented. No focal neurological deficits. Extremities: Symmetric 5 x 5 power. Skin: No rashes, lesions or ulcers Psychiatry: Judgement and insight appear normal. Mood & affect appropriate.     Data Reviewed: I have personally reviewed following labs and imaging studies  CBC: Recent Labs  Lab 12/22/18 1258 12/23/18 0558 12/24/18 0616  WBC 14.6* 8.7 4.8  NEUTROABS 12.5*  --  3.7  HGB 12.6* 10.0* 9.7*  HCT 38.7* 31.7* 31.0*  MCV 91.5 94.9 94.8  PLT 124* 92* 99*   Basic Metabolic Panel: Recent Labs  Lab 12/22/18 1258 12/23/18 0558 12/24/18 0616  NA 135 135 136  K 4.0 3.7  3.5  CL 98 106 108  CO2 19* 21* 23  GLUCOSE 145* 106* 92  BUN 35* 26* 20  CREATININE 1.46* 0.97 0.85  CALCIUM 9.7 8.5* 8.6*   GFR: Estimated Creatinine Clearance: 87.2 mL/min (by C-G formula based on SCr of 0.85 mg/dL). Liver Function Tests: Recent Labs  Lab 12/22/18 1258  AST 54*  ALT 28  ALKPHOS 54  BILITOT 1.4*  PROT 7.9  ALBUMIN 3.9   No results for input(s): LIPASE, AMYLASE in the last 168 hours. No results for input(s): AMMONIA in the last 168 hours. Coagulation Profile: Recent Labs  Lab 12/22/18 1258  INR 1.5*   Cardiac Enzymes: No results for input(s): CKTOTAL, CKMB, CKMBINDEX, TROPONINI in the last 168 hours. BNP (last 3 results) No results for input(s): PROBNP in the  last 8760 hours. HbA1C: No results for input(s): HGBA1C in the last 72 hours. CBG: No results for input(s): GLUCAP in the last 168 hours. Lipid Profile: No results for input(s): CHOL, HDL, LDLCALC, TRIG, CHOLHDL, LDLDIRECT in the last 72 hours. Thyroid Function Tests: No results for input(s): TSH, T4TOTAL, FREET4, T3FREE, THYROIDAB in the last 72 hours. Anemia Panel: No results for input(s): VITAMINB12, FOLATE, FERRITIN, TIBC, IRON, RETICCTPCT in the last 72 hours. Sepsis Labs: Recent Labs  Lab 12/22/18 1257 12/22/18 1516  LATICACIDVEN 3.7* 1.8    Recent Results (from the past 240 hour(s))  Culture, blood (Routine x 2)     Status: None (Preliminary result)   Collection Time: 12/22/18 12:44 PM   Specimen: BLOOD LEFT FOREARM  Result Value Ref Range Status   Specimen Description   Final    BLOOD LEFT FOREARM Performed at [facility], [place] [address], [place], [place] [address]    Special Requests   Final    BOTTLES DRAWN AEROBIC AND ANAEROBIC Blood Culture results may not be optimal due to an excessive volume of blood received in culture bottles Performed at [facility] [address], [place], [place] [address]    Culture   Final    NO GROWTH 2 DAYS Performed at [facility] [address], [place], [place] [address]    Report Status PENDING  Incomplete  Urine culture     Status: Abnormal   Collection Time: 12/22/18 12:44 PM   Specimen: In/Out Cath Urine  Result Value Ref Range Status   Specimen Description   Final    IN/OUT CATH URINE Performed at [facility] [address] [place], [place] [address]    Special Requests   Final    NONE Performed at [facility], [place] [address], [place], [place] [address]    Culture (A)  Final    60,000 COLONIES/mL STAPHYLOCOCCUS HAEMOLYTICUS 50,000 COLONIES/mL ENTEROCOCCUS FAECALIS    Report Status 12/24/2018 FINAL  Final   Organism ID, Bacteria  STAPHYLOCOCCUS HAEMOLYTICUS (A)  Final   Organism ID, Bacteria ENTEROCOCCUS FAECALIS (A)  Final      Susceptibility   Enterococcus faecalis - MIC*    AMPICILLIN <=2 SENSITIVE Sensitive     LEVOFLOXACIN 1 SENSITIVE Sensitive     NITROFURANTOIN <=16 SENSITIVE Sensitive     VANCOMYCIN 1 SENSITIVE Sensitive     * 50,000 COLONIES/mL ENTEROCOCCUS FAECALIS   Staphylococcus haemolyticus - MIC*    CIPROFLOXACIN >=8 RESISTANT Resistant     GENTAMICIN 8 INTERMEDIATE Intermediate     NITROFURANTOIN <=16 SENSITIVE Sensitive     OXACILLIN >=4 RESISTANT Resistant     TETRACYCLINE >=  16 RESISTANT Resistant     VANCOMYCIN 2 SENSITIVE Sensitive     TRIMETH/SULFA >=320 RESISTANT Resistant     CLINDAMYCIN >=8 RESISTANT Resistant     RIFAMPIN <=0.5 SENSITIVE Sensitive     Inducible Clindamycin NEGATIVE Sensitive     * 60,000 COLONIES/mL STAPHYLOCOCCUS HAEMOLYTICUS  Culture, blood (Routine x 2)     Status: None (Preliminary result)   Collection Time: 12/22/18 12:49 PM   Specimen: BLOOD LEFT FOREARM  Result Value Ref Range Status   Specimen Description   Final    BLOOD LEFT FOREARM Performed at [facility], [place] [address] [place], [place] [address]    Special Requests   Final    BOTTLES DRAWN AEROBIC AND ANAEROBIC Blood Culture results may not be optimal due to an inadequate volume of blood received in culture bottles Performed at [facility] [address], [place], [place] [address]    Culture   Final    NO GROWTH 2 DAYS Performed at [facility] [address], [place], [place] [address]    Report Status PENDING  Incomplete  SARS Coronavirus 2 (CEPHEID- Performed in [facility] hospital lab), Hosp Order     Status: None   Collection Time: 12/22/18  1:03 PM   Specimen: Nasopharyngeal Swab  Result Value Ref Range Status   SARS Coronavirus 2 NEGATIVE NEGATIVE Final    Comment: (NOTE) If result is NEGATIVE SARS-CoV-2 target nucleic acids are NOT DETECTED. The  SARS-CoV-2 RNA is generally detectable in upper and lower  respiratory specimens during the acute phase of infection. The lowest  concentration of SARS-CoV-2 viral copies this assay can detect is 250  copies / mL. A negative result does not preclude SARS-CoV-2 infection  and should not be used as the sole basis for treatment or other  patient management decisions.  A negative result may occur with  improper specimen collection / handling, submission of specimen other  than nasopharyngeal swab, presence of viral mutation(s) within the  areas targeted by this assay, and inadequate number of viral copies  (<250 copies / mL). A negative result must be combined with clinical  observations, patient history, and epidemiological information. If result is POSITIVE SARS-CoV-2 target nucleic acids are DETECTED. The SARS-CoV-2 RNA is generally detectable in upper and lower  respiratory specimens dur ing the acute phase of infection.  Positive  results are indicative of active infection with SARS-CoV-2.  Clinical  correlation with patient history and other diagnostic information is  necessary to determine patient infection status.  Positive results do  not rule out bacterial infection or co-infection with other viruses. If result is PRESUMPTIVE POSTIVE SARS-CoV-2 nucleic acids MAY BE PRESENT.   A presumptive positive result was obtained on the submitted specimen  and confirmed on repeat testing.  While 2019 novel coronavirus  (SARS-CoV-2) nucleic acids may be present in the submitted sample  additional confirmatory testing may be necessary for epidemiological  and / or clinical management purposes  to differentiate between  SARS-CoV-2 and other Sarbecovirus currently known to infect humans.  If clinically indicated additional testing with an alternate test  methodology [phone]) is advised. The SARS-CoV-2 RNA is generally  detectable in upper and lower respiratory sp ecimens during the acute   phase of infection. The expected result is Negative. Fact Sheet for Patients:  StrictlyIdeas.no Fact Sheet for Healthcare Providers: BankingDealers.co.za This test is not yet approved or cleared by the [place] FDA and has been authorized for  detection and/or diagnosis of SARS-CoV-2 by FDA under an Emergency Use Authorization (EUA).  This EUA will remain in effect (meaning this test can be used) for the duration of the COVID-19 declaration under Section 564(b)(1) of the Act, 21 U.S.C. section 360bbb-3(b)(1), unless the authorization is terminated or revoked sooner. Performed at [facility], [place] [address] [place], [place] [address]          Radiology Studies: Ct Abdomen Pelvis W Contrast  Result Date: 12/22/2018 CLINICAL DATA:  Abdominal pain and fevers, history of prior cystectomy on 11/09/2018 EXAM: CT ABDOMEN AND PELVIS WITH CONTRAST TECHNIQUE: Multidetector CT imaging of the abdomen and pelvis was performed using the standard protocol following bolus administration of intravenous contrast. CONTRAST:  187mL OMNIPAQUE IOHEXOL 300 MG/ML  SOLN COMPARISON:  10/13/2018 FINDINGS: Lower chest: Lung bases are free of acute infiltrate or sizable effusion. No parenchymal nodules are noted. Hepatobiliary: Diffuse fatty infiltration of the liver is noted. The gallbladder is well distended with multiple gallstones within. No pericholecystic fluid or wall thickening is noted. Pancreas: Unremarkable. No pancreatic ductal dilatation or surrounding inflammatory changes. Spleen: Normal in size without focal abnormality. Adrenals/Urinary Tract: Adrenal glands are within normal limits. The right kidney is well visualize without renal calculi or obstructive change. The left kidney demonstrates slight delay in opacification with respect to the right. Mild obstructive changes are noted no obstructing stone is identified. The dilatation may  be related to some edema at the insertion site in the ileostomy. The bladder has been surgically removed consistent with the patient's given clinical history. Fluid is noted in the surgical bed which measures approximately the 7.5 by the 6.1 cm in greatest dimension. It is slightly more prominent to the right of the midline. No air is noted or enhancement to suggest abscess in this may simply represent a postoperative seroma. In the right lower quadrant there is an ileostomy identified. Stomach/Bowel: Scattered diverticular change of the colon is noted. No diverticulitis is seen. The appendix is not visualized consistent with a prior surgical history. No obstructive changes of the small bowel are seen. Stomach is within normal limits. Vascular/Lymphatic: Aortic atherosclerosis. No enlarged abdominal or pelvic lymph nodes. Reproductive: Prostate has been surgically removed. Other: No abdominal wall hernia or abnormality. Musculoskeletal: No acute bony abnormality is seen. Degenerative changes of the lumbar spine are noted. IMPRESSION: Changes consistent with prior cystoprostatectomy with a fluid collection in the surgical bed likely representing a postop seroma. No findings to suggest focal abscess are identified. Dilatation of the left collecting system with delayed enhancement which extends to the level of the anastomosis with the ileal conduit. No stone is identified in this may represent edema at the anastomotic site. This is likely the etiology of the patient's abdominal pain. Cholelithiasis without complicating factors. Diverticulosis without evidence of diverticulitis. Electronically Signed   By: Inez Catalina M.D.   On: 12/22/2018 16:30   Dg Chest Port 1 View  Result Date: 12/22/2018 CLINICAL DATA:  Fever, malaise EXAM: PORTABLE CHEST 1 VIEW COMPARISON:  02/25/2016 FINDINGS: The heart size and mediastinal contours are within normal limits. Both lungs are clear. Right shoulder arthroplasty. IMPRESSION: No  active disease. Electronically Signed   By: Davina Poke M.D.   On: 12/22/2018 13:38        Scheduled Meds: . gabapentin  100 mg Oral TID  . heparin  5,000 Units Subcutaneous Q8H  . pantoprazole  40 mg Oral Q0600  . senna-docusate  1 tablet Oral BID  Continuous Infusions: . sodium chloride 75 mL/hr at 12/24/18 1058  . vancomycin       LOS: 2 days    Time spent: 40 minutes    Irine Seal, MD [facility]  If 7PM-7AM, please contact night-coverage [email or web address] 12/24/2018, 12:26 PM

## 2018-12-24 NOTE — Progress Notes (Signed)
Subjective: [name] is doing well with no fever or leukocytosis.  He has mild residual pain in the left flank.  He is constipated.   ROS:  Review of Systems  All other systems reviewed and are negative.   Anti-infectives: Anti-infectives (From admission, onward)   Start     Dose/Rate Route Frequency Ordered Stop   12/23/18 1400  vancomycin (VANCOCIN) 1,250 mg in sodium chloride 0.9 % 250 mL IVPB     1,250 mg 166.7 mL/hr over 90 Minutes Intravenous Every 24 hours 12/22/18 1719     12/23/18 0900  ceFEPIme (MAXIPIME) 2 g in sodium chloride 0.9 % 100 mL IVPB     2 g 200 mL/hr over 30 Minutes Intravenous Every 8 hours 12/23/18 0840     12/23/18 0200  ceFEPIme (MAXIPIME) 2 g in sodium chloride 0.9 % 100 mL IVPB  Status:  Discontinued     2 g 200 mL/hr over 30 Minutes Intravenous Every 12 hours 12/22/18 1719 12/23/18 0840   12/22/18 1330  vancomycin (VANCOCIN) 2,000 mg in sodium chloride 0.9 % 500 mL IVPB     2,000 mg 250 mL/hr over 120 Minutes Intravenous  Once 12/22/18 1317 12/22/18 1657   12/22/18 1315  ceFEPIme (MAXIPIME) 2 g in sodium chloride 0.9 % 100 mL IVPB     2 g 200 mL/hr over 30 Minutes Intravenous  Once 12/22/18 1303 12/22/18 1343   12/22/18 1315  metroNIDAZOLE (FLAGYL) IVPB 500 mg     500 mg 100 mL/hr over 60 Minutes Intravenous  Once 12/22/18 1303 12/22/18 1417   12/22/18 1315  vancomycin (VANCOCIN) IVPB 1000 mg/200 mL premix  Status:  Discontinued     1,000 mg 200 mL/hr over 60 Minutes Intravenous  Once 12/22/18 1303 12/22/18 1317      Current Facility-Administered Medications  Medication Dose Route Frequency Provider Last Rate Last Dose  . 0.9 %  sodium chloride infusion   Intravenous Continuous Barb Merino, MD 125 mL/hr at 12/24/18 0529    . acetaminophen (TYLENOL) tablet 650 mg  650 mg Oral Q6H PRN Barb Merino, MD   650 mg at 12/23/18 1722   Or  . acetaminophen (TYLENOL) suppository 650 mg  650 mg Rectal Q6H PRN Barb Merino, MD      . ceFEPIme  (MAXIPIME) 2 g in sodium chloride 0.9 % 100 mL IVPB  2 g Intravenous Q8H Pham, Anh P, RPH 200 mL/hr at 12/24/18 0014 2 g at 12/24/18 0014  . gabapentin (NEURONTIN) capsule 100 mg  100 mg Oral TID Barb Merino, MD   100 mg at 12/23/18 2119  . heparin injection 5,000 Units  5,000 Units Subcutaneous Q8H Barb Merino, MD   5,000 Units at 12/24/18 0523  . ondansetron (ZOFRAN) tablet 4 mg  4 mg Oral Q6H PRN Barb Merino, MD   4 mg at 12/24/18 1191   Or  . ondansetron (ZOFRAN) injection 4 mg  4 mg Intravenous Q6H PRN Barb Merino, MD      . oxyCODONE (Oxy IR/ROXICODONE) immediate release tablet 5 mg  5 mg Oral Q4H PRN Barb Merino, MD   5 mg at 12/24/18 0528  . pantoprazole (PROTONIX) EC tablet 40 mg  40 mg Oral Q0600 Eugenie Filler, MD   40 mg at 12/24/18 0525  . polyethylene glycol (MIRALAX / GLYCOLAX) packet 17 g  17 g Oral Daily PRN Barb Merino, MD      . potassium chloride SA (K-DUR) CR tablet 40 mEq  40 mEq Oral Once  Eugenie Filler, MD      . senna-docusate (Senokot-S) tablet 1 tablet  1 tablet Oral BID Eugenie Filler, MD   1 tablet at 12/23/18 2119  . vancomycin (VANCOCIN) 1,250 mg in sodium chloride 0.9 % 250 mL IVPB  1,250 mg Intravenous Q24H Adrian Saran, RPH 166.7 mL/hr at 12/23/18 1344 1,250 mg at 12/23/18 1344     Objective: Vital signs in last 24 hours: Temp:  [98 F (36.7 C)-98.9 F (37.2 C)] 98.3 F (36.8 C) (07/25 0534) Pulse Rate:  [78-91] 78 (07/25 0534) Resp:  [16-20] 17 (07/25 0534) BP: (139-155)/(83-92) 152/92 (07/25 0534) SpO2:  [97 %-98 %] 98 % (07/25 0534)  Intake/Output from previous day: 07/24 0701 - 07/25 0700 In: -  Out: 2650 [Urine:2650] Intake/Output this shift: No intake/output data recorded.   Physical Exam Vitals signs reviewed.  Constitutional:      Appearance: Normal appearance.  Cardiovascular:     Rate and Rhythm: Normal rate and regular rhythm.     Heart sounds: Normal heart sounds.  Pulmonary:     Effort: Pulmonary  effort is normal. No respiratory distress.     Breath sounds: Normal breath sounds.  Abdominal:     Palpations: Abdomen is soft.     Tenderness: There is abdominal tenderness (mild left flank).     Comments: Ostomy is pink and productive in RLQ.    Neurological:     Mental Status: He is alert.     Lab Results:  Recent Labs    12/23/18 0558 12/24/18 0616  WBC 8.7 4.8  HGB 10.0* 9.7*  HCT 31.7* 31.0*  PLT 92* 99*   BMET Recent Labs    12/23/18 0558 12/24/18 0616  NA 135 136  K 3.7 3.5  CL 106 108  CO2 21* 23  GLUCOSE 106* 92  BUN 26* 20  CREATININE 0.97 0.85  CALCIUM 8.5* 8.6*   PT/INR Recent Labs    12/22/18 1258  LABPROT 17.7*  INR 1.5*   ABG No results for input(s): PHART, HCO3 in the last 72 hours.  Invalid input(s): PCO2, PO2  Studies/Results: Ct Abdomen Pelvis W Contrast  Result Date: 12/22/2018 CLINICAL DATA:  Abdominal pain and fevers, history of prior cystectomy on 11/09/2018 EXAM: CT ABDOMEN AND PELVIS WITH CONTRAST TECHNIQUE: Multidetector CT imaging of the abdomen and pelvis was performed using the standard protocol following bolus administration of intravenous contrast. CONTRAST:  158mL OMNIPAQUE IOHEXOL 300 MG/ML  SOLN COMPARISON:  10/13/2018 FINDINGS: Lower chest: Lung bases are free of acute infiltrate or sizable effusion. No parenchymal nodules are noted. Hepatobiliary: Diffuse fatty infiltration of the liver is noted. The gallbladder is well distended with multiple gallstones within. No pericholecystic fluid or wall thickening is noted. Pancreas: Unremarkable. No pancreatic ductal dilatation or surrounding inflammatory changes. Spleen: Normal in size without focal abnormality. Adrenals/Urinary Tract: Adrenal glands are within normal limits. The right kidney is well visualize without renal calculi or obstructive change. The left kidney demonstrates slight delay in opacification with respect to the right. Mild obstructive changes are noted no  obstructing stone is identified. The dilatation may be related to some edema at the insertion site in the ileostomy. The bladder has been surgically removed consistent with the patient's given clinical history. Fluid is noted in the surgical bed which measures approximately the 7.5 by the 6.1 cm in greatest dimension. It is slightly more prominent to the right of the midline. No air is noted or enhancement to suggest abscess in this  may simply represent a postoperative seroma. In the right lower quadrant there is an ileostomy identified. Stomach/Bowel: Scattered diverticular change of the colon is noted. No diverticulitis is seen. The appendix is not visualized consistent with a prior surgical history. No obstructive changes of the small bowel are seen. Stomach is within normal limits. Vascular/Lymphatic: Aortic atherosclerosis. No enlarged abdominal or pelvic lymph nodes. Reproductive: Prostate has been surgically removed. Other: No abdominal wall hernia or abnormality. Musculoskeletal: No acute bony abnormality is seen. Degenerative changes of the lumbar spine are noted. IMPRESSION: Changes consistent with prior cystoprostatectomy with a fluid collection in the surgical bed likely representing a postop seroma. No findings to suggest focal abscess are identified. Dilatation of the left collecting system with delayed enhancement which extends to the level of the anastomosis with the ileal conduit. No stone is identified in this may represent edema at the anastomotic site. This is likely the etiology of the patient's abdominal pain. Cholelithiasis without complicating factors. Diverticulosis without evidence of diverticulitis. Electronically Signed   By: Inez Catalina M.D.   On: 12/22/2018 16:30   Dg Chest Port 1 View  Result Date: 12/22/2018 CLINICAL DATA:  Fever, malaise EXAM: PORTABLE CHEST 1 VIEW COMPARISON:  02/25/2016 FINDINGS: The heart size and mediastinal contours are within normal limits. Both lungs are  clear. Right shoulder arthroplasty. IMPRESSION: No active disease. Electronically Signed   By: Davina Poke M.D.   On: 12/22/2018 13:38     Assessment and Plan:  73 y.o. male with likely pyelonephritis in the setting of an ileal conduit with refluxing anastomosis.  Although imaging shows slight dilation of the left-sided collecting system this is more likely physiologic in nature and no intervention is needed at this time.  He is improving clinically and has minimal pain.  Continue current therapy.   Constipation.  I will order ambulation and a dulcolax suppository.      LOS: 2 days    Irine Seal 12/24/2018 [phone]

## 2018-12-24 NOTE — Progress Notes (Signed)
Pharmacy Antibiotic Note  Billy Rasmussen is a 73 y.o. male with hx high risk bladder cancer status post robotic cystoprostatectomy in June 2020.  He went to see his urologist on 7/22 with c/o fever, chills and poor appetite and was given a prescription for vantin.  His symptoms did not improve and he subsequently presented to the ED on 7/23 for further workup.  Vancomycin discontinued this AM based on Cx from Urology office, but subsequently grew out MR staph haemolyticus as well as enterococcus on cultures drawn here. Pharmacy to resume vancomycin  Today, 12/24/2018: - Tmax 103.2 - wbc down wnl - scr down 0.97 (crcl~76) with NS at 125 ml/hr - all cultures have been negative this far  Plan: - Resume vancomycin at 750 mg IV q12 hr (AUC 475 with SCr 0.85 and Vd 0.5) - resuming when previous dose would have been due, so no effective break in therapy  - SCr q48 hr while on vancomycin ________________________________________  Height: 5\' 6"  (167.6 cm) Weight: 215 lb (97.5 kg) IBW/kg (Calculated) : 63.8  Temp (24hrs), Avg:98.4 F (36.9 C), Min:98 F (36.7 C), Max:98.9 F (37.2 C)  Recent Labs  Lab 12/22/18 1257 12/22/18 1258 12/22/18 1516 12/23/18 0558 12/24/18 0616  WBC  --  14.6*  --  8.7 4.8  CREATININE  --  1.46*  --  0.97 0.85  LATICACIDVEN 3.7*  --  1.8  --   --     Estimated Creatinine Clearance: 87.2 mL/min (by C-G formula based on SCr of 0.85 mg/dL).    No Known Allergies  Antimicrobials this admission: 7/21 flagyl x 1 7/23 vanc >>  7/23 cefepime >> 7/25 unasyn >> 7/25  Dose adjustments this admission: 7/25 adjust vanc to 750 q12 with improved Scr  Microbiology results: 7/23 BCx x2: ngtd 7/23 ucx: 60k S haemolyticus (S clinda, vanc); 50k E faecalis (pansens)   Thank you for allowing pharmacy to be a part of this patient's care.  Reuel Boom, PharmD, BCPS [phone] 12/24/2018, 3:03 PM

## 2018-12-25 DIAGNOSIS — N390 Urinary tract infection, site not specified: Secondary | ICD-10-CM

## 2018-12-25 DIAGNOSIS — C679 Malignant neoplasm of bladder, unspecified: Secondary | ICD-10-CM

## 2018-12-25 DIAGNOSIS — B952 Enterococcus as the cause of diseases classified elsewhere: Secondary | ICD-10-CM

## 2018-12-25 LAB — CBC WITH DIFFERENTIAL/PLATELET
Abs Immature Granulocytes: 0.05 10*3/uL (ref 0.00–0.07)
Basophils Absolute: 0 10*3/uL (ref 0.0–0.1)
Basophils Relative: 0 %
Eosinophils Absolute: 0.1 10*3/uL (ref 0.0–0.5)
Eosinophils Relative: 2 %
HCT: 32.2 % — ABNORMAL LOW (ref 39.0–52.0)
Hemoglobin: 10.2 g/dL — ABNORMAL LOW (ref 13.0–17.0)
Immature Granulocytes: 1 %
Lymphocytes Relative: 12 %
Lymphs Abs: 0.7 10*3/uL (ref 0.7–4.0)
MCH: 29.5 pg (ref 26.0–34.0)
MCHC: 31.7 g/dL (ref 30.0–36.0)
MCV: 93.1 fL (ref 80.0–100.0)
Monocytes Absolute: 0.6 10*3/uL (ref 0.1–1.0)
Monocytes Relative: 12 %
Neutro Abs: 4 10*3/uL (ref 1.7–7.7)
Neutrophils Relative %: 73 %
Platelets: 124 10*3/uL — ABNORMAL LOW (ref 150–400)
RBC: 3.46 MIL/uL — ABNORMAL LOW (ref 4.22–5.81)
RDW: 14.1 % (ref 11.5–15.5)
WBC: 5.4 10*3/uL (ref 4.0–10.5)
nRBC: 0 % (ref 0.0–0.2)

## 2018-12-25 LAB — BASIC METABOLIC PANEL
Anion gap: 7 (ref 5–15)
BUN: 14 mg/dL (ref 8–23)
CO2: 24 mmol/L (ref 22–32)
Calcium: 9 mg/dL (ref 8.9–10.3)
Chloride: 104 mmol/L (ref 98–111)
Creatinine, Ser: 0.93 mg/dL (ref 0.61–1.24)
GFR calc Af Amer: >60 mL/min (ref >60)
GFR calc non Af Amer: >60 mL/min (ref >60)
Glucose, Bld: 99 mg/dL (ref 70–99)
Potassium: 3.8 mmol/L (ref 3.5–5.1)
Sodium: 135 mmol/L (ref 135–145)

## 2018-12-25 MED ORDER — LINEZOLID 600 MG PO TABS: 600.0000 mg | ORAL_TABLET | Freq: Two times a day (BID) | ORAL | 0 refills | Status: AC

## 2018-12-25 MED ORDER — SENNOSIDES-DOCUSATE SODIUM 8.6-50 MG PO TABS: 1.0000 | ORAL_TABLET | Freq: Two times a day (BID) | ORAL | Status: DC

## 2018-12-25 MED ORDER — POLYETHYLENE GLYCOL 3350 17 G PO PACK: 17.0000 g | PACK | Freq: Every day | ORAL | 0 refills | Status: DC | PRN

## 2018-12-25 MED ORDER — LINEZOLID 600 MG PO TABS
600.0000 mg | ORAL_TABLET | Freq: Two times a day (BID) | ORAL | Status: DC
  Administered 2018-12-25: 15:00:00 600 mg via ORAL
  Filled 2018-12-25 (×2): qty 1

## 2018-12-25 NOTE — Progress Notes (Signed)
Discharge instructions explained to patient and 2 prescriptions given to him. He was discharged via wheelchair to wife. Patient denies having any questions. He had been ambulating in the halls today, took a shower and changed his urostomy bag.

## 2018-12-25 NOTE — Discharge Instructions (Signed)
Antibiotic Medicine, Adult  Antibiotic medicines are used to treat infections caused by bacteria, such as strep throat and urinary tract infection (UTI). Antibiotic medicines will not work for viral illnesses, such as colds or the flu (influenza). They work by killing the bacteria that is making you sick. Antibiotics can also have serious side effects. It is important that you take antibiotic medicines safely and only when needed. When do I need to take antibiotics? Antibiotics are medicines that treat bacterial infections. You may need antibiotics for:  UTI.  Strep throat.  Meningitis. This infection affects the spinal cord and brain.  Bacterial sinusitis.  Serious lung infection. You may start antibiotics while your health care provider waits for test results to come back. Common tests may include throat, urine, blood, or mucus culture. Your health care provider may change or stop the antibiotic depending on your test results. When are antibiotics not needed? You do not need antibiotics for most common illnesses. These illnesses may be caused by a virus, not a bacteria. You do not need antibiotics for:  The common cold.  Influenza.  Sore throat.  Discolored mucus.  Bronchitis. Antibiotics are not always needed for all bacterial infections. Many of these infections clear up without antibiotic treatment. Do not ask for or take antibiotics when they are not necessary. How long should I take the antibiotic? You must take the entire prescription. Continue to take your antibiotic for as long as told by your health care provider. Do not stop taking it even if you start to feel better. If you stop taking it too soon:  You may start to feel sick again.  Your infection may become harder to treat.  Complications may develop. Each course of antibiotics needs a different amount of time to work. Some antibiotic courses last only a few days. Some last about a week to 10 days. In some  cases, you may need to take antibiotics for a few weeks to completely treat the infection. What if I miss a dose? Try not to miss any doses of medicine. If you miss a dose, call your health care provider or pharmacist for advice. Sometimes it is okay to take the missed dose as soon as possible. What are the risks of taking antibiotics? Most antibiotics can cause an infection called Clostridioides difficile (C. difficile or C. diff), which causes severe diarrhea. This infection happens when the antibiotics kill the healthy bacteria in your intestines. This allows C. diff to grow. The infection needs to be treated right away. Let your health care provider know if:  You have diarrhea while taking an antibiotic.  You have diarrhea after you stop taking an antibiotic. C. diff infection can start weeks after stopping the antibiotic. Taking an antibiotic also puts you at risk for getting a bacteria that does not respond to medicine (antibiotic-resistant infection) in the future. Antibiotics can cause bacteria to change so that if the antibiotic is taken again, the medicine is not able to kill the bacteria. These infections can be more serious and, in some cases, life-threatening. Do antibiotics affect birth control? Birth control pills may not work while you are on antibiotics. If you are taking birth control pills, continue taking them as usual and use a second form of birth control, such as a condom, to avoid unwanted pregnancy. Continue using the second form of birth control until your health care provider says you can stop. What else should I know about taking antibiotics? It is important for you  to take antibiotics exactly as told. Make sure that you:  Take the entire course of antibiotic that was prescribed. Do not stop taking your antibiotics even if your symptoms improve.  Take the correct amount of medicine each day.  Ask your health care provider: ? How long to wait in between doses. ? If the  antibiotic should be taken with food. ? If there are any foods, drinks, or medicines that you should avoid while taking the antibiotics. ? If there are any side effects you should be aware of.  Only use the antibiotics prescribed for you by your health care provider. Do not use antibiotics prescribed for someone else.  Drink a large glass of water along with the antibiotics.  Ask the pharmacist for a syringe, cup, or spoon that properly measures the antibiotics.  Throw away any leftover medicine. Contact a health care provider if:  Your symptoms get worse.  You have new joint pain or muscle aches that begin after starting the antibiotic. When should I seek immediate medical care?  You have signs of a serious allergic reaction to antibiotics. If you have signs of a severe allergic reaction, stop taking the antibiotic right away. Signs may include: ? Hives, which are raised, itchy, red bumps on the skin. ? Skin rash. ? Trouble breathing. ? A wheezing sound when you breathe. ? Swelling anywhere on your body. ? Feeling dizzy. ? Vomiting.  Your urine turns dark or becomes blood-colored.  Your skin turns yellow.  You bruise or bleed easily.  You have severe diarrhea and abdominal cramps.  You have a severe headache. Summary  Antibiotic medicines are used to treat infections caused by bacteria, such as strep throat and UTIs. It is important that you take antibiotic medicines only when needed.  Your health care provider may change or stop the antibiotic depending on your test results.  Most antibiotics can cause an infection called Clostridioides difficile (C. difficile or C. diff), which causes severe diarrhea. Let your health care provider know if you develop diarrhea while taking an antibiotic.  Take the entire course of antibiotic that was prescribed. This information is not intended to replace advice given to you by your health care provider. Make sure you discuss any  questions you have with your health care provider. Document Released: 01/29/2004 Document Revised: 11/16/2017 Document Reviewed: 05/19/2016 Elsevier Patient Education  [place].   Fever, Adult     A fever is an increase in your body's temperature. It often means a temperature of 100.68F (38C) or higher. Brief mild or moderate fevers often have no long-term effects. They often do not need treatment. Moderate or high fevers may make you feel uncomfortable. Sometimes, they can be a sign of a serious illness or disease. A fever that keeps coming back or that lasts a long time may cause you to lose water in your body (get dehydrated). You can take your temperature with a thermometer to see if you have a fever. Temperature can change with:  Age.  Time of day.  Where the thermometer is put in the body. Readings may vary when the thermometer is put: ? In the mouth (oral). ? In the butt (rectal). ? In the ear (tympanic). ? Under the arm (axillary). ? On the forehead (temporal). Follow these instructions at home: Medicines  Take over-the-counter and prescription medicines only as told by your doctor. Follow the dosing instructions carefully.  If you were prescribed an antibiotic medicine, take it as told by  your doctor. Do not stop taking it even if you start to feel better. General instructions  Watch for any changes in your symptoms. Tell your doctor about them.  Rest as needed.  Drink enough fluid to keep your pee (urine) pale yellow.  Sponge yourself or bathe with room-temperature water as needed. This helps to lower your body temperature. Do not use ice water.  Do not use too many blankets or wear clothes that are too heavy.  If your fever was caused by an infection that spreads from person to person (is contagious), such as a cold or the flu: ? You should stay home from work and public places for at least 24 hours after your fever is gone. ? Your fever should be gone  for at least 24 hours without the need to use medicines. Contact a doctor if:  You throw up (vomit).  You cannot eat or drink without throwing up.  You have watery poop (diarrhea).  It hurts when you pee.  Your symptoms do not get better with treatment.  You have new symptoms.  You feel very weak. Get help right away if:  You are short of breath or have trouble breathing.  You are dizzy or you pass out (faint).  You feel mixed up (confused).  You have signs of not having enough water in your body, such as: ? Dark pee, very little pee, or no pee. ? Cracked lips. ? Dry mouth. ? Sunken eyes. ? Sleepiness. ? Weakness.  You have very bad pain in your belly (abdomen).  You keep throwing up or having watery poop.  You have a rash on your skin.  Your symptoms get worse all of a sudden. Summary  A fever is an increase in your body's temperature. It often means a temperature of 100.67F (38C) or higher.  Watch for any changes in your symptoms. Tell your doctor about them.  Take all medicines only as told by your doctor.  Do not go to work or other public places if your fever was caused by an illness that can spread to other people.  Get help right away if you have signs that you do not have enough water in your body. This information is not intended to replace advice given to you by your health care provider. Make sure you discuss any questions you have with your health care provider. Document Released: 02/25/2008 Document Revised: 11/01/2017 Document Reviewed: 11/01/2017 Elsevier Patient Education  [place].   Contrast-Induced Nephropathy Contrast-induced nephropathy (CIN) is a rare type of kidney damage caused by the dye that may be used in certain imaging tests. This dye is called a contrast medium. For these tests, the dye may be injected into a person's body to make tissue or blood vessels show up more clearly. A contrast medium may be used in imaging  tests such as:  CT scans.  MRIs.  Angiograms. With CIN, the contrast medium may damage kidney cells directly. It may also cause a reaction in the kidneys that decreases blood supply and oxygen to the kidneys. Lack of blood and oxygen causes temporary kidney damage that usually gets better in about 2 weeks. Very rarely, severe damage may require the kidneys to be filtered by a machine (dialysis). What are the causes? The exact cause of CIN is not known. In some cases, there may be more than one cause. What increases the risk? The following factors may make you more likely to develop this condition:  Having kidney  disease before you are exposed to contrast media. This is the main risk factor.  Having other conditions, such as: ? Dehydration. ? Diabetes. ? High blood pressure. ? Low blood pressure. ? Heart disease. ? Anemia. ? A type of blood cancer that affects the kidneys (multiple myeloma).  Older age.  Needing an emergency exam that requires the use of contrast media.  Receiving a high dose of contrast media or receiving contrast media within 72 hours of the previous time you got it.  Having contrast media injected into a blood vessel that carries blood away from the heart (artery).  Being on a medicine that affects kidney function. These include NSAIDs, some antibiotics, and some cancer drugs. What are the signs or symptoms? Symptoms of this condition often develop 2-3 days after getting contrast media. Symptoms may include:  Feeling tired (fatigue).  Appetite loss.  Swelling of the feet or ankles.  Puffy, swollen eyes.  Dry, itchy skin. In some cases, there are no symptoms. How is this diagnosed? This condition may be diagnosed based on:  Your symptoms and medical history. Your health care provider may suspect CIN if you recently had an imaging test or procedure that included the use of contrast media.  A physical exam.  Blood and urine tests to check for  kidney damage. How is this treated? There is no specific treatment for CIN. If you develop CIN, you may be given fluids through an IV that is inserted into one of your veins. This will help keep your kidneys active. Electrolytes may be added to the fluid if you have an electrolyte imbalance. In most cases, CIN will get better in about 2 weeks. In rare cases, you may need kidney dialysis if kidney failure occurs and urine flow decreases. Even if you need dialysis, the condition usually improves over time. Follow these instructions at home:   Take over-the-counter and prescription medicines only as told by your health care provider. Over-the-counter NSAIDs, such as aspirin or ibuprofen, can increase your risk of CIN.  Return to your normal activities as told by your health care provider. Ask your health care provider what activities are safe for you.  Drink enough fluid to keep your urine pale yellow.  Always let a health care provider know that you have a history of CIN before having any test or procedure that may include contrast material.  Keep all follow-up visits as told by your health care provider. This is important. How is this prevented? Take the following steps to help reduce your risk for developing CIN:  Tell your health care provider about any risk factors you have for CIN.  Ask if a contrast medium is necessary for imaging tests or procedures you are scheduled to have. Ask if there is an alternative to the test.  Make sure you drink plenty of fluids prior to any imaging tests or procedures that require contrast medium.  If possible, do not undergo multiple imaging tests or procedures that require contrast medium unless enough time has passed between tests. Contact a health care provider if:  You have any signs or symptoms of CIN.  You are urinating less than usual. Summary  Contrast-induced nephropathy (CIN) is a rare type of kidney damage caused by contrast  medium.  The exact cause of CIN is not known. In some cases, there may be more than one cause.  Sometimes CIN does not cause any symptoms. If you do have symptoms, they may start 2-3 days after getting contrast  medium.  To confirm the diagnosis, you may have blood tests and urine tests to check for kidney damage.  There is no specific treatment for CIN. In most cases, this condition will usually get better in about 2 weeks. You may be given IV fluids with electrolytes. In rare cases, dialysis is needed. This information is not intended to replace advice given to you by your health care provider. Make sure you discuss any questions you have with your health care provider. Document Released: 08/30/2017 Document Revised: 08/30/2017 Document Reviewed: 08/30/2017 Elsevier Patient Education  [place]. Acute Kidney Injury, Adult  Acute kidney injury is a sudden worsening of kidney function. The kidneys are organs that have several jobs. They filter the blood to remove waste products and extra fluid. They also maintain a healthy balance of minerals and hormones in the body, which helps control blood pressure and keep bones strong. With this condition, your kidneys do not do their jobs as well as they should. This condition ranges from mild to severe. Over time it may develop into long-lasting (chronic) kidney disease. Early detection and treatment may prevent acute kidney injury from developing into a chronic condition. What are the causes? Common causes of this condition include:  A problem with blood flow to the kidneys. This may be caused by: ? Low blood pressure (hypotension) or shock. ? Blood loss. ? Heart and blood vessel (cardiovascular) disease. ? Severe burns. ? Liver disease.  Direct damage to the kidneys. This may be caused by: ? Certain medicines. ? A kidney infection. ? Poisoning. ? Being around or in contact with toxic substances. ? A surgical wound. ? A hard, direct  hit to the kidney area.  A sudden blockage of urine flow. This may be caused by: ? Cancer. ? Kidney stones. ? An enlarged prostate in males. What are the signs or symptoms? Symptoms of this condition may not be obvious until the condition becomes severe. Symptoms of this condition can include:  Tiredness (lethargy), or difficulty staying awake.  Nausea or vomiting.  Swelling (edema) of the face, legs, ankles, or feet.  Problems with urination, such as: ? Abdominal pain, or pain along the side of your stomach (flank). ? Decreased urine production. ? Decrease in the force of urine flow.  Muscle twitches and cramps, especially in the legs.  Confusion or trouble concentrating.  Loss of appetite.  Fever. How is this diagnosed? This condition may be diagnosed with tests, including:  Blood tests.  Urine tests.  Imaging tests.  A test in which a sample of tissue is removed from the kidneys to be examined under a microscope (kidney biopsy). How is this treated? Treatment for this condition depends on the cause and how severe the condition is. In mild cases, treatment may not be needed. The kidneys may heal on their own. In more severe cases, treatment will involve:  Treating the cause of the kidney injury. This may involve changing any medicines you are taking or adjusting your dosage.  Fluids. You may need specialized IV fluids to balance your body's needs.  Having a catheter placed to drain urine and prevent blockages.  Preventing problems from occurring. This may mean avoiding certain medicines or procedures that can cause further injury to the kidneys. In some cases treatment may also require:  A procedure to remove toxic wastes from the body (dialysis or continuous renal replacement therapy - CRRT).  Surgery. This may be done to repair a torn kidney, or to remove the  blockage from the urinary system. Follow these instructions at home: Medicines  Take  over-the-counter and prescription medicines only as told by your health care provider.  Do not take any new medicines without your health care provider's approval. Many medicines can worsen your kidney damage.  Do not take any vitamin and mineral supplements without your health care provider's approval. Many nutritional supplements can worsen your kidney damage. Lifestyle  If your health care provider prescribed changes to your diet, follow them. You may need to decrease the amount of protein you eat.  Achieve and maintain a healthy weight. If you need help with this, ask your health care provider.  Start or continue an exercise plan. Try to exercise at least 30 minutes a day, 5 days a week.  Do not use any tobacco products, such as cigarettes, chewing tobacco, and e-cigarettes. If you need help quitting, ask your health care provider. General instructions  Keep track of your blood pressure. Report changes in your blood pressure as told by your health care provider.  Stay up to date with immunizations. Ask your health care provider which immunizations you need.  Keep all follow-up visits as told by your health care provider. This is important. Where to find more information  American Association of Kidney Patients: BombTimer.gl  National Kidney Foundation: www.kidney.[place]: https://mathis.com/  [facility]: ? www.lifeoptions.org ? www.kidneyschool.org Contact a health care provider if:  Your symptoms get worse.  You develop new symptoms. Get help right away if:  You develop symptoms of worsening kidney disease, which include: ? Headaches. ? Abnormally dark or light skin. ? Easy bruising. ? Frequent hiccups. ? Chest pain. ? Shortness of breath. ? End of menstruation in women. ? Seizures. ? Confusion or altered mental status. ? Abdominal or back pain. ? Itchiness.  You have a fever.  Your body is producing less urine.  You  have pain or bleeding when you urinate. Summary  Acute kidney injury is a sudden worsening of kidney function.  Acute kidney injury can be caused by problems with blood flow to the kidneys, direct damage to the kidneys, and sudden blockage of urine flow.  Symptoms of this condition may not be obvious until it becomes severe. Symptoms may include edema, lethargy, confusion, nausea or vomiting, and problems passing urine.  This condition can usually be diagnosed with blood tests, urine tests, and imaging tests. Sometimes a kidney biopsy is done to diagnose this condition.  Treatment for this condition often involves treating the underlying cause. It is treated with fluids, medicines, dialysis, diet changes, or surgery. This information is not intended to replace advice given to you by your health care provider. Make sure you discuss any questions you have with your health care provider. Document Released: 12/01/2010 Document Revised: 04/30/2017 Document Reviewed: 05/08/2016 Elsevier Patient Education  2020 [employer].

## 2018-12-25 NOTE — Discharge Summary (Signed)
Physician Discharge Summary  Billy Rasmussen EPP:[id number] DOB: Mar 23, 1946 DOA: 12/22/2018  PCP: Terald Sleeper, PA-C  Admit date: 12/22/2018 Discharge date: 12/25/2018  Time spent: 60 minutes  Recommendations for Outpatient Follow-up:  1. Follow-up with Dr. Tresa Moore urology in 1 week. 2. Follow-up with Terald Sleeper, PA-C in 2 weeks.  On follow-up patient need a basic metabolic profile done to follow-up on electrolytes and renal function.   Discharge Diagnoses:  Principal Problem:   Sepsis ([place]) Active Problems:   Acute pyelonephritis   Urinary tract infection without hematuria   HTN (hypertension)   Malignant neoplasm of urinary bladder (HCC)   AKI (acute kidney injury) ([place])   Gastroesophageal reflux disease   Enterococcus UTI   Discharge Condition: Stable and improved  Diet recommendation: Heart healthy  Filed Weights   12/22/18 1243  Weight: 97.5 kg    History of present illness:  Per Dr. Dimitri Ped is a 73 y.o. male with medical history significant of bladder cancer status post radical cystoprostatectomy ectomy on 11/09/2018 with ileal conduit and lymph node resection who presented to the hospital for fever, weakness, nausea decreased appetite and right flank and back pain.  According to the patient started about 10 days ago when he started not feeling well, he had intermittent temperature, he stated it was up to 105 at home.  He had no nausea or vomiting but really no appetite.  Bowel habits are normal.  He has noted dark urine in his pouch for last few days.  Patient denied any headache, dizziness, lightheadedness or syncopal episode.  No chest pain or shortness of breath.  No cough or cold symptoms or flulike symptoms.  Right flank pain and back pain are mild in intensity with no radiation. Patient visited his urology office few days ago and was started on antibiotics?  Unable to verify which antibiotics and was told to come to ER if worse.  He had fever today  so he came back to ER he is intermittently taking his blood pressure medications.. ED Course: Blood pressures are low normal, map is adequate.  Patient was tachycardic.  Initial lactic acid was 3.7.  Blood work with evidence of leukocytosis, acute renal failure and hemoconcentration.  Urinalysis grossly abnormal, however it is from ileal conduit. CT scan shows postoperative changes as well hydronephrosis on the left side. Urology notified by ER.  Hospital Course:  1 sepsis secondary to pyelonephritis/complicated UTI, POA Patient with recent instrumentation status post radical cystoprostatectomy 11/09/2018 with ileal conduit and lymph node resection, status post band stent removal 12/15/2018 and patient then placed empirically on Keflex.  Second stent was to be removed the week of 7/29 however per urology came out on its own prior and he presented to the urology clinic on 12/21/2018 with fevers, chills, malaise, poor oral intake was given Rocephin and started on empiric cefpodoxime in the clinic.  Urine cultures pending at time of admission..  Patient presented back to the ED with temperatures at home as high as 105.  Patient noted to have a temperature as high as 103.2 and noted to be tachycardic with heart rate of 142 on presentation.  Patient noted to have a leukocytosis.  Urinalysis done on admission concerning for UTI.  Patient pancultured with blood cultures with no growth to date after 3 days.  Urine cultures had 60,000 colonies of Staphylococcus hemolyticus of 50,000 colonies of Enterococcus faecalis. Patient however received antibiotics prior to blood cultures and urine cultures obtained during this  admission.   Patient started empirically on IV vancomycin IV cefepime on admission.  Urology consulted who are recommended treating pyelonephritis for 14-day course with culture specific antibiotics.  Was updated by Dr. Jeffie Pollock of urology, on 12/23/2018, that urine cultures from 12/21/2018 with greater than  100,000 colonies of enterococcus sensitive to ampicillin, ciprofloxacin, Levaquin, daptomycin, nitrofurantoin, linezolid, vancomycin.  Resistant to tetracycline.  Patient was maintained on IV vancomycin and IV cefepime. Patient currently remained afebrile during the hospitalization, leukocytosis improved and had resolved by day of discharge.  Blood cultures with no growth to date.  Urine cultures with 60,000 colonies of Staphylococcus hemolyticus and 50,000 colonies of Enterococcus faecalis.  Initial plan was to transition patient to IV ampicillin which he received a dose of the morning of 12/24/2018, however due to Staphylococcus hemolyticus as well as Enterococcus faecalis in recent urine cultures patient was maintained on IV vancomycin and IV cefepime and IV ampicillin discontinued.  Patient improved clinically.  Patient was subsequently transition to oral Zyvox will be discharged home on 8 more days of oral Zyvox to complete a 10-day course of antibiotic treatment.  Antibiotic duration and recommendations were discussed with ID who were in agreement.  Outpatient follow-up with urology.  2.  Acute renal failure Likely secondary to prerenal azotemia in the setting of ACE inhibitor and diuretic and problem #1.  ACE inhibitor and diuretic were held.  Patient hydrated with IV fluids.  Renal function improved daily.  Outpatient follow-up with PCP.   3.  Hypertension Antihypertensive medications were held during the hospitalization due to concerns for sepsis and risk of hypotension.  Blood pressure improved with hydration.  Patient was started back on HCTZ and patient's antihypertensive medications will be resumed on discharge.  Outpatient follow-up with PCP.   4.  Gastroesophageal reflux disease Patient maintained on a PPI during the hospitalization.  Outpatient follow-up.  5.  Constipation Patient with complaints of constipation.  Patient was given a dose of MiraLAX and then placed on Senokot-S twice  daily.  Patient given a soapsuds enema with good results.  Outpatient follow-up.     Procedures:  CT abdomen and pelvis 12/22/2018  Consultations:  Urology: Dr. Anthony Sar 12/23/2018  Curb sided infectious diseases.  Discharge Exam: Vitals:   12/24/18 2137 12/25/18 0529  BP: (!) 153/100 (!) 146/92  Pulse: 83 86  Resp: 17 17  Temp: 99.1 F (37.3 C) 99 F (37.2 C)  SpO2: 100% 96%    General: NAD Cardiovascular: RRR Respiratory: CTAB  Discharge Instructions   Discharge Instructions    Diet - low sodium heart healthy   Complete by: As directed    Increase activity slowly   Complete by: As directed      Allergies as of 12/25/2018   No Known Allergies     Medication List    STOP taking these medications   cefpodoxime 200 MG tablet Commonly known as: VANTIN     TAKE these medications   diclofenac 75 MG EC tablet Commonly known as: VOLTAREN Take 1 tablet (75 mg total) by mouth 2 (two) times daily. What changed:   how much to take  when to take this   docusate sodium 100 MG capsule Commonly known as: COLACE Take 200 mg by mouth daily.   linezolid 600 MG tablet Commonly known as: ZYVOX Take 1 tablet (600 mg total) by mouth every 12 (twelve) hours for 8 days.   lisinopril-hydrochlorothiazide 20-12.5 MG tablet Commonly known as: ZESTORETIC Take 1 tablet by mouth daily.  oxyCODONE 5 MG immediate release tablet Commonly known as: Oxy IR/ROXICODONE Take 1 tablet (5 mg total) by mouth every 4 (four) hours as needed for moderate pain or severe pain. Post-Operatively   polyethylene glycol 17 g packet Commonly known as: MIRALAX / GLYCOLAX Take 17 g by mouth daily as needed for mild constipation.   senna-docusate 8.6-50 MG tablet Commonly known as: Senokot-S Take 1 tablet by mouth 2 (two) times daily.      No Known Allergies Follow-up Information    Terald Sleeper, PA-C. Schedule an appointment as soon as possible for a visit in 2 week(s).   Specialties:  Physician Assistant, Family Medicine Contact information: [place] [address] [phone]        Alexis Frock, MD. Schedule an appointment as soon as possible for a visit in 1 week(s).   Specialty: Urology Contact information: [place] [address] [phone]            The results of significant diagnostics from this hospitalization (including imaging, microbiology, ancillary and laboratory) are listed below for reference.    Significant Diagnostic Studies: Ct Abdomen Pelvis W Contrast  Result Date: 12/22/2018 CLINICAL DATA:  Abdominal pain and fevers, history of prior cystectomy on 11/09/2018 EXAM: CT ABDOMEN AND PELVIS WITH CONTRAST TECHNIQUE: Multidetector CT imaging of the abdomen and pelvis was performed using the standard protocol following bolus administration of intravenous contrast. CONTRAST:  132mL OMNIPAQUE IOHEXOL 300 MG/ML  SOLN COMPARISON:  10/13/2018 FINDINGS: Lower chest: Lung bases are free of acute infiltrate or sizable effusion. No parenchymal nodules are noted. Hepatobiliary: Diffuse fatty infiltration of the liver is noted. The gallbladder is well distended with multiple gallstones within. No pericholecystic fluid or wall thickening is noted. Pancreas: Unremarkable. No pancreatic ductal dilatation or surrounding inflammatory changes. Spleen: Normal in size without focal abnormality. Adrenals/Urinary Tract: Adrenal glands are within normal limits. The right kidney is well visualize without renal calculi or obstructive change. The left kidney demonstrates slight delay in opacification with respect to the right. Mild obstructive changes are noted no obstructing stone is identified. The dilatation may be related to some edema at the insertion site in the ileostomy. The bladder has been surgically removed consistent with the patient's given clinical history. Fluid is noted in the surgical bed which measures approximately the 7.5 by the  6.1 cm in greatest dimension. It is slightly more prominent to the right of the midline. No air is noted or enhancement to suggest abscess in this may simply represent a postoperative seroma. In the right lower quadrant there is an ileostomy identified. Stomach/Bowel: Scattered diverticular change of the colon is noted. No diverticulitis is seen. The appendix is not visualized consistent with a prior surgical history. No obstructive changes of the small bowel are seen. Stomach is within normal limits. Vascular/Lymphatic: Aortic atherosclerosis. No enlarged abdominal or pelvic lymph nodes. Reproductive: Prostate has been surgically removed. Other: No abdominal wall hernia or abnormality. Musculoskeletal: No acute bony abnormality is seen. Degenerative changes of the lumbar spine are noted. IMPRESSION: Changes consistent with prior cystoprostatectomy with a fluid collection in the surgical bed likely representing a postop seroma. No findings to suggest focal abscess are identified. Dilatation of the left collecting system with delayed enhancement which extends to the level of the anastomosis with the ileal conduit. No stone is identified in this may represent edema at the anastomotic site. This is likely the etiology of the patient's abdominal pain. Cholelithiasis without complicating factors. Diverticulosis without  evidence of diverticulitis. Electronically Signed   By: Inez Catalina M.D.   On: 12/22/2018 16:30   Dg Chest Port 1 View  Result Date: 12/22/2018 CLINICAL DATA:  Fever, malaise EXAM: PORTABLE CHEST 1 VIEW COMPARISON:  02/25/2016 FINDINGS: The heart size and mediastinal contours are within normal limits. Both lungs are clear. Right shoulder arthroplasty. IMPRESSION: No active disease. Electronically Signed   By: Davina Poke M.D.   On: 12/22/2018 13:38    Microbiology: Recent Results (from the past 240 hour(s))  Culture, blood (Routine x 2)     Status: None (Preliminary result)   Collection  Time: 12/22/18 12:44 PM   Specimen: BLOOD LEFT FOREARM  Result Value Ref Range Status   Specimen Description   Final    BLOOD LEFT FOREARM Performed at [facility], [address], [place], [place] [address]    Special Requests   Final    BOTTLES DRAWN AEROBIC AND ANAEROBIC Blood Culture results may not be optimal due to an excessive volume of blood received in culture bottles Performed at [facility] [address] [place], [place] [address]    Culture   Final    NO GROWTH 3 DAYS Performed at [facility], [place] [address], [place], [place] [address]    Report Status PENDING  Incomplete  Urine culture     Status: Abnormal   Collection Time: 12/22/18 12:44 PM   Specimen: In/Out Cath Urine  Result Value Ref Range Status   Specimen Description   Final    IN/OUT CATH URINE Performed at [facility] [address], [place], [place] [address]    Special Requests   Final    NONE Performed at [facility], [place] [address] [place], [place] [address]    Culture (A)  Final    60,000 COLONIES/mL STAPHYLOCOCCUS HAEMOLYTICUS 50,000 COLONIES/mL ENTEROCOCCUS FAECALIS    Report Status 12/24/2018 FINAL  Final   Organism ID, Bacteria STAPHYLOCOCCUS HAEMOLYTICUS (A)  Final   Organism ID, Bacteria ENTEROCOCCUS FAECALIS (A)  Final      Susceptibility   Enterococcus faecalis - MIC*    AMPICILLIN <=2 SENSITIVE Sensitive     LEVOFLOXACIN 1 SENSITIVE Sensitive     NITROFURANTOIN <=16 SENSITIVE Sensitive     VANCOMYCIN 1 SENSITIVE Sensitive     * 50,000 COLONIES/mL ENTEROCOCCUS FAECALIS   Staphylococcus haemolyticus - MIC*    CIPROFLOXACIN >=8 RESISTANT Resistant     GENTAMICIN 8 INTERMEDIATE Intermediate     NITROFURANTOIN <=16 SENSITIVE Sensitive     OXACILLIN >=4 RESISTANT Resistant     TETRACYCLINE >=16 RESISTANT Resistant     VANCOMYCIN 2 SENSITIVE Sensitive     TRIMETH/SULFA >=320 RESISTANT Resistant     CLINDAMYCIN  >=8 RESISTANT Resistant     RIFAMPIN <=0.5 SENSITIVE Sensitive     Inducible Clindamycin NEGATIVE Sensitive     * 60,000 COLONIES/mL STAPHYLOCOCCUS HAEMOLYTICUS  Culture, blood (Routine x 2)     Status: None (Preliminary result)   Collection Time: 12/22/18 12:49 PM   Specimen: BLOOD LEFT FOREARM  Result Value Ref Range Status   Specimen Description   Final    BLOOD LEFT FOREARM Performed at [facility], [address] [place], [place] [address]    Special Requests   Final    BOTTLES DRAWN AEROBIC AND ANAEROBIC Blood Culture results may not be optimal due to an inadequate volume of blood received in culture bottles Performed at [facility]  [facility], [place] [address], [place], [place] [address]    Culture   Final    NO GROWTH 3 DAYS Performed at [facility], [place] [address], [place], [place] [address]    Report Status PENDING  Incomplete  SARS Coronavirus 2 (CEPHEID- Performed in [facility] lab), Hosp Order     Status: None   Collection Time: 12/22/18  1:03 PM   Specimen: Nasopharyngeal Swab  Result Value Ref Range Status   SARS Coronavirus 2 NEGATIVE NEGATIVE Final    Comment: (NOTE) If result is NEGATIVE SARS-CoV-2 target nucleic acids are NOT DETECTED. The SARS-CoV-2 RNA is generally detectable in upper and lower  respiratory specimens during the acute phase of infection. The lowest  concentration of SARS-CoV-2 viral copies this assay can detect is 250  copies / mL. A negative result does not preclude SARS-CoV-2 infection  and should not be used as the sole basis for treatment or other  patient management decisions.  A negative result may occur with  improper specimen collection / handling, submission of specimen other  than nasopharyngeal swab, presence of viral mutation(s) within the  areas targeted by this assay, and inadequate number of viral copies  (<250 copies / mL). A negative result must be combined with clinical  observations, patient  history, and epidemiological information. If result is POSITIVE SARS-CoV-2 target nucleic acids are DETECTED. The SARS-CoV-2 RNA is generally detectable in upper and lower  respiratory specimens dur ing the acute phase of infection.  Positive  results are indicative of active infection with SARS-CoV-2.  Clinical  correlation with patient history and other diagnostic information is  necessary to determine patient infection status.  Positive results do  not rule out bacterial infection or co-infection with other viruses. If result is PRESUMPTIVE POSTIVE SARS-CoV-2 nucleic acids MAY BE PRESENT.   A presumptive positive result was obtained on the submitted specimen  and confirmed on repeat testing.  While 2019 novel coronavirus  (SARS-CoV-2) nucleic acids may be present in the submitted sample  additional confirmatory testing may be necessary for epidemiological  and / or clinical management purposes  to differentiate between  SARS-CoV-2 and other Sarbecovirus currently known to infect humans.  If clinically indicated additional testing with an alternate test  methodology [phone]) is advised. The SARS-CoV-2 RNA is generally  detectable in upper and lower respiratory sp ecimens during the acute  phase of infection. The expected result is Negative. Fact Sheet for Patients:  StrictlyIdeas.no Fact Sheet for Healthcare Providers: BankingDealers.co.za This test is not yet approved or cleared by the [place] FDA and has been authorized for detection and/or diagnosis of SARS-CoV-2 by FDA under an Emergency Use Authorization (EUA).  This EUA will remain in effect (meaning this test can be used) for the duration of the COVID-19 declaration under Section 564(b)(1) of the Act, 21 U.S.C. section 360bbb-3(b)(1), unless the authorization is terminated or revoked sooner. Performed at [facility], [place] [address] [place],  [place] [address]      Labs: Basic Metabolic Panel: Recent Labs  Lab 12/22/18 1258 12/23/18 0558 12/24/18 0616 12/25/18 0621  NA 135 135 136 135  K 4.0 3.7 3.5 3.8  CL 98 106 108 104  CO2 19* 21* 23 24  GLUCOSE 145* 106* 92 99  BUN 35* 26* 20 14  CREATININE 1.46* 0.97 0.85 0.93  CALCIUM 9.7 8.5* 8.6* 9.0   Liver Function Tests: Recent Labs  Lab 12/22/18 1258  AST 54*  ALT  28  ALKPHOS 54  BILITOT 1.4*  PROT 7.9  ALBUMIN 3.9   No results for input(s): LIPASE, AMYLASE in the last 168 hours. No results for input(s): AMMONIA in the last 168 hours. CBC: Recent Labs  Lab 12/22/18 1258 12/23/18 0558 12/24/18 0616 12/25/18 0621  WBC 14.6* 8.7 4.8 5.4  NEUTROABS 12.5*  --  3.7 4.0  HGB 12.6* 10.0* 9.7* 10.2*  HCT 38.7* 31.7* 31.0* 32.2*  MCV 91.5 94.9 94.8 93.1  PLT 124* 92* 99* 124*   Cardiac Enzymes: No results for input(s): CKTOTAL, CKMB, CKMBINDEX, TROPONINI in the last 168 hours. BNP: BNP (last 3 results) No results for input(s): BNP in the last 8760 hours.  ProBNP (last 3 results) No results for input(s): PROBNP in the last 8760 hours.  CBG: No results for input(s): GLUCAP in the last 168 hours.     Signed:  Irine Seal MD.  [facility] 12/25/2018, 2:11 PM

## 2018-12-25 NOTE — TOC Initial Note (Signed)
Transition of Care [facility]) - Initial/Assessment Note    Patient Details  Name: Billy Rasmussen MRN: [id number] Date of Birth: 1946/05/25  Transition of Care [facility]) CM/SW Contact:    Joaquin Courts, RN Phone Number: 12/25/2018, 1:42 PM  Clinical Narrative: CM called and verified cost of Zyvox for 9 day supply is 53.81$. CM spoke with patient who states he can afford that cost.                   Expected Discharge Plan: Home/Self Care Barriers to Discharge: Continued Medical Work up   Patient Goals and CMS Choice Patient states their goals for this hospitalization and ongoing recovery are:: to go home      Expected Discharge Plan and Services Expected Discharge Plan: Home/Self Care   Discharge Planning Services: CM Consult   Living arrangements for the past 2 months: Single Family Home Expected Discharge Date: (unknown)               DME Arranged: N/A DME Agency: NA       HH Arranged: NA HH Agency: NA        Prior Living Arrangements/Services Living arrangements for the past 2 months: Single Family Home Lives with:: Spouse Patient language and need for interpreter reviewed:: Yes Do you feel safe going back to the place where you live?: Yes      Need for Family Participation in Patient Care: Yes (Comment) Care giver support system in place?: Yes (comment)   Criminal Activity/Legal Involvement Pertinent to Current Situation/Hospitalization: No - Comment as needed  Activities of Daily Living Home Assistive Devices/Equipment: Dentures (specify type), Eyeglasses, Cane (specify quad or straight), Hearing aid(upper/lower partial plates, single point cane, bilateral hearing aids) ADL Screening (condition at time of admission) Patient's cognitive ability adequate to safely complete daily activities?: Yes Is the patient deaf or have difficulty hearing?: Yes(wears hearing aids) Does the patient have difficulty seeing, even when wearing glasses/contacts?: No Does the  patient have difficulty concentrating, remembering, or making decisions?: No Patient able to express need for assistance with ADLs?: Yes Does the patient have difficulty dressing or bathing?: No Independently performs ADLs?: Yes (appropriate for developmental age) Does the patient have difficulty walking or climbing stairs?: Yes(secondary to weakness) Weakness of Legs: Both Weakness of Arms/Hands: Both  Permission Sought/Granted                  Emotional Assessment Appearance:: Appears stated age Attitude/Demeanor/Rapport: Engaged Affect (typically observed): Accepting Orientation: : Oriented to Place, Oriented to  Time, Oriented to Situation, Oriented to Self   Psych Involvement: No (comment)  Admission diagnosis:  Sepsis [facility]) [A41.9] Patient Active Problem List   Diagnosis Date Noted  . Urinary tract infection without hematuria   . Acute pyelonephritis   . Gastroesophageal reflux disease   . Sepsis ([place]) 12/22/2018  . AKI (acute kidney injury) ([place]) 12/22/2018  . Bladder cancer ([place]) 11/09/2018  . 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 Acute cystitis with hematuria 10/02/2016  . Pure hypercholesterolemia 10/02/2016  . Malignant neoplasm of urinary bladder ([place]) 10/02/2016  . Lipoma 09/30/2016  . Obese 03/31/2016  . S/P right TKA 03/30/2016  . S/P knee replacement 03/30/2016  . HTN (hypertension) 02/25/2016  . Healthcare maintenance 02/25/2016  . Obesity (BMI 30-39.9) 01/07/2016  . S/P shoulder replacement 07/20/2013  . Shoulder arthritis 04/07/2013  . CELLULITIS, KNEE, LEFT 08/26/2006   PCP:  Terald Sleeper, PA-C Pharmacy:   [id number] [place], [place]  [address] [address] Phone: [phone] Fax: [phone]  [facility] [place], [place] [address] Phone: [phone] Fax: [phone]     Social Determinants of Health (SDOH) Interventions     Readmission Risk Interventions No flowsheet data found.

## 2018-12-26 ENCOUNTER — Other Ambulatory Visit: Payer: Self-pay

## 2018-12-27 DIAGNOSIS — Z436 Encounter for attention to other artificial openings of urinary tract: Secondary | ICD-10-CM | POA: Diagnosis not present

## 2018-12-27 DIAGNOSIS — Z906 Acquired absence of other parts of urinary tract: Secondary | ICD-10-CM | POA: Diagnosis not present

## 2018-12-27 DIAGNOSIS — Z87891 Personal history of nicotine dependence: Secondary | ICD-10-CM | POA: Diagnosis not present

## 2018-12-27 DIAGNOSIS — Z483 Aftercare following surgery for neoplasm: Secondary | ICD-10-CM | POA: Diagnosis not present

## 2018-12-27 DIAGNOSIS — C679 Malignant neoplasm of bladder, unspecified: Secondary | ICD-10-CM | POA: Diagnosis not present

## 2018-12-27 LAB — CULTURE, BLOOD (ROUTINE X 2)
Culture: NO GROWTH
Culture: NO GROWTH

## 2018-12-29 ENCOUNTER — Ambulatory Visit (INDEPENDENT_AMBULATORY_CARE_PROVIDER_SITE_OTHER): Payer: Medicare Other | Admitting: *Deleted

## 2018-12-29 ENCOUNTER — Encounter: Payer: Self-pay | Admitting: *Deleted

## 2018-12-29 DIAGNOSIS — Z0000 Encounter for general adult medical examination without abnormal findings: Secondary | ICD-10-CM | POA: Diagnosis not present
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
## 2018-12-29 NOTE — Patient Instructions (Signed)
Billy Rasmussen , Thank you for taking time to come for your Medicare Wellness Visit. I appreciate your ongoing commitment to your health goals. Please review the following plan we discussed and let me know if I can assist you in the future.   These are the goals we discussed: Goals     Prevent falls     Stay active  Cont. To do the lords work        This is a list of the screening recommended for you and due dates:  Health Maintenance  Topic Date Due   Tetanus Vaccine  11/02/1966   Pneumonia vaccines (2 of 2 - PCV13) 12/28/2018   Flu Shot  12/31/2018   Colon Cancer Screening  05/11/2028    Hepatitis C: One time screening is recommended by Center for Disease Control  (CDC) for  adults born from [birth year] through 1963.   Completed    Advance Directive  Advance directives are legal documents that let you make choices ahead of time about your health care and medical treatment in case you become unable to communicate for yourself. Advance directives are a way for you to communicate your wishes to family, friends, and health care providers. This can help convey your decisions about end-of-life care if you become unable to communicate. Discussing and writing advance directives should happen over time rather than all at once. Advance directives can be changed depending on your situation and what you want, even after you have signed the advance directives. If you do not have an advance directive, some states assign family decision makers to act on your behalf based on how closely you are related to them. Each state has its own laws regarding advance directives. You may want to check with your health care provider, attorney, or state representative about the laws in your state. There are different types of advance directives, such as:  Medical power of attorney.  Living will.  Do not resuscitate (DNR) or do not attempt resuscitation (DNAR) order. Health care proxy and medical power of  attorney A health care proxy, also called a health care agent, is a person who is appointed to make medical decisions for you in cases in which you are unable to make the decisions yourself. Generally, people choose someone they know well and trust to represent their preferences. Make sure to ask this person for an agreement to act as your proxy. A proxy may have to exercise judgment in the event of a medical decision for which your wishes are not known. A medical power of attorney is a legal document that names your health care proxy. Depending on the laws in your state, after the document is written, it may also need to be:  Signed.  Notarized.  Dated.  Copied.  Witnessed.  Incorporated into your medical record. You may also want to appoint someone to manage your financial affairs in a situation in which you are unable to do so. This is called a durable power of attorney for finances. It is a separate legal document from the durable power of attorney for health care. You may choose the same person or someone different from your health care proxy to act as your agent in financial matters. If you do not appoint a proxy, or if there is a concern that the proxy is not acting in your best interests, a court-appointed guardian may be designated to act on your behalf. Living will A living will is a set of instructions documenting your  wishes about medical care when you cannot express them yourself. Health care providers should keep a copy of your living will in your medical record. You may want to give a copy to family members or friends. To alert caregivers in case of an emergency, you can place a card in your wallet to let them know that you have a living will and where they can find it. A living will is used if you become:  Terminally ill.  Incapacitated.  Unable to communicate or make decisions. Items to consider in your living will include:  The use or non-use of life-sustaining equipment,  such as dialysis machines and breathing machines (ventilators).  A DNR or DNAR order, which is the instruction not to use cardiopulmonary resuscitation (CPR) if breathing or heartbeat stops.  The use or non-use of tube feeding.  Withholding of food and fluids.  Comfort (palliative) care when the goal becomes comfort rather than a cure.  Organ and tissue donation. A living will does not give instructions for distributing your money and property if you should pass away. It is recommended that you seek the advice of a lawyer when writing a will. Decisions about taxes, beneficiaries, and asset distribution will be legally binding. This process can relieve your family and friends of any concerns surrounding disputes or questions that may come up about the distribution of your assets. DNR or DNAR A DNR or DNAR order is a request not to have CPR in the event that your heart stops beating or you stop breathing. If a DNR or DNAR order has not been made and shared, a health care provider will try to help any patient whose heart has stopped or who has stopped breathing. If you plan to have surgery, talk with your health care provider about how your DNR or DNAR order will be followed if problems occur. Summary  Advance directives are the legal documents that allow you to make choices ahead of time about your health care and medical treatment in case you become unable to communicate for yourself.  The process of discussing and writing advance directives should happen over time. You can change the advance directives, even after you have signed them.  Advance directives include DNR or DNAR orders, living wills, and designating an agent as your medical power of attorney. This information is not intended to replace advice given to you by your health care provider. Make sure you discuss any questions you have with your health care provider. Document Released: 08/25/2007 Document Revised: 06/22/2018 Document  Reviewed: 04/06/2016 Elsevier Patient Education  [place].    BMI for Adults  Body mass index (BMI) is a number that is calculated from a person's weight and height. BMI may help to estimate how much of a person's weight is composed of fat. BMI can help identify those who may be at higher risk for certain medical problems. How is BMI used with adults? BMI is used as a screening tool to identify possible weight problems. It is used to check whether a person is obese, overweight, healthy weight, or underweight. How is BMI calculated? BMI measures your weight and compares it to your height. This can be done either in [place] ([place] or metric measurements. Note that charts are available to help you find your BMI quickly and easily without having to do these calculations yourself. To calculate your BMI in [place] ([place] measurements, your health care provider will: 1. Measure your weight in pounds (lb). 2. Multiply the number of  pounds by 703. ? For example, for a person who weighs 180 lb, multiply that number by 703, which equals 126,540. 3. Measure your height in inches (in). Then multiply that number by itself to get a measurement called "inches squared." ? For example, for a person who is 70 in tall, the "inches squared" measurement is 70 in x 70 in, which equals 4900 inches squared. 4. Divide the total from Step 2 (number of lb x 703) by the total from Step 3 (inches squared): 126,540  4900 = 25.8. This is your BMI. To calculate your BMI in metric measurements, your health care provider will: 1. Measure your weight in kilograms (kg). 2. Measure your height in meters (m). Then multiply that number by itself to get a measurement called "meters squared." ? For example, for a person who is 1.75 m tall, the "meters squared" measurement is 1.75 m x 1.75 m, which is equal to 3.1 meters squared. 3. Divide the number of kilograms (your weight) by the meters squared number. In this example:  70  3.1 = 22.6. This is your BMI. How is BMI interpreted? To interpret your results, your health care provider will use BMI charts to identify whether you are underweight, normal weight, overweight, or obese. The following guidelines will be used:  Underweight: BMI less than 18.5.  Normal weight: BMI between 18.5 and 24.9.  Overweight: BMI between 25 and 29.9.  Obese: BMI of 30 and above. Please note:  Weight includes both fat and muscle, so someone with a muscular build, such as an athlete, may have a BMI that is higher than 24.9. In cases like these, BMI is not an accurate measure of body fat.  To determine if excess body fat is the cause of a BMI of 25 or higher, further assessments may need to be done by a health care provider.  BMI is usually interpreted in the same way for men and women. Why is BMI a useful tool? BMI is useful in two ways:  Identifying a weight problem that may be related to a medical condition, or that may increase the risk for medical problems.  Promoting lifestyle and diet changes in order to reach a healthy weight. Summary  Body mass index (BMI) is a number that is calculated from a person's weight and height.  BMI may help to estimate how much of a person's weight is composed of fat. BMI can help identify those who may be at higher risk for certain medical problems.  BMI can be measured using English measurements or metric measurements.  To interpret your results, your health care provider will use BMI charts to identify whether you are underweight, normal weight, overweight, or obese. This information is not intended to replace advice given to you by your health care provider. Make sure you discuss any questions you have with your health care provider. Document Released: 01/28/2004 Document Revised: 04/30/2017 Document Reviewed: 03/31/2017 Elsevier Patient Education  2020 [employer].

## 2018-12-30 ENCOUNTER — Ambulatory Visit (INDEPENDENT_AMBULATORY_CARE_PROVIDER_SITE_OTHER): Payer: Medicare Other | Admitting: Physician Assistant

## 2018-12-30 ENCOUNTER — Other Ambulatory Visit: Payer: Self-pay

## 2018-12-30 ENCOUNTER — Encounter: Payer: Self-pay | Admitting: Physician Assistant

## 2018-12-30 VITALS — BP 142/84 | HR 69 | Temp 98.4°F | Ht 66.0 in | Wt 212.6 lb

## 2018-12-30 DIAGNOSIS — Z0000 Encounter for general adult medical examination without abnormal findings: Secondary | ICD-10-CM

## 2018-12-30 DIAGNOSIS — R6520 Severe sepsis without septic shock: Secondary | ICD-10-CM | POA: Diagnosis not present

## 2018-12-30 DIAGNOSIS — A419 Sepsis, unspecified organism: Secondary | ICD-10-CM | POA: Diagnosis not present

## 2018-12-30 DIAGNOSIS — E7800 Pure hypercholesterolemia, unspecified: Secondary | ICD-10-CM | POA: Diagnosis not present

## 2018-12-30 DIAGNOSIS — C679 Malignant neoplasm of bladder, unspecified: Secondary | ICD-10-CM

## 2018-12-31 LAB — CMP14+EGFR
ALT: 21 IU/L (ref 0–44)
AST: 36 IU/L (ref 0–40)
Albumin/Globulin Ratio: 1.5 (ref 1.2–2.2)
Albumin: 4.3 g/dL (ref 3.7–4.7)
Alkaline Phosphatase: 76 IU/L (ref 39–117)
BUN/Creatinine Ratio: 18 (ref 10–24)
BUN: 19 mg/dL (ref 8–27)
Bilirubin Total: 0.4 mg/dL (ref 0.0–1.2)
CO2: 26 mmol/L (ref 20–29)
Calcium: 9.8 mg/dL (ref 8.6–10.2)
Chloride: 99 mmol/L (ref 96–106)
Creatinine, Ser: 1.07 mg/dL (ref 0.76–1.27)
GFR calc Af Amer: 80 mL/min/{1.73_m2} (ref >59)
GFR calc non Af Amer: 69 mL/min/{1.73_m2} (ref >59)
Globulin, Total: 2.9 g/dL (ref 1.5–4.5)
Glucose: 95 mg/dL (ref 65–99)
Potassium: 5 mmol/L (ref 3.5–5.2)
Sodium: 140 mmol/L (ref 134–144)
Total Protein: 7.2 g/dL (ref 6.0–8.5)

## 2018-12-31 LAB — CBC WITH DIFFERENTIAL/PLATELET
Basophils Absolute: 0 10*3/uL (ref 0.0–0.2)
Basos: 0 %
EOS (ABSOLUTE): 0.2 10*3/uL (ref 0.0–0.4)
Eos: 2 %
Hematocrit: 36.7 % — ABNORMAL LOW (ref 37.5–51.0)
Hemoglobin: 11.7 g/dL — ABNORMAL LOW (ref 13.0–17.7)
Immature Grans (Abs): 0.1 10*3/uL (ref 0.0–0.1)
Immature Granulocytes: 1 %
Lymphocytes Absolute: 1.1 10*3/uL (ref 0.7–3.1)
Lymphs: 14 %
MCH: 28.9 pg (ref 26.6–33.0)
MCHC: 31.9 g/dL (ref 31.5–35.7)
MCV: 91 fL (ref 79–97)
Monocytes Absolute: 0.5 10*3/uL (ref 0.1–0.9)
Monocytes: 6 %
Neutrophils Absolute: 5.9 10*3/uL (ref 1.4–7.0)
Neutrophils: 77 %
Platelets: 332 10*3/uL (ref 150–450)
RBC: 4.05 x10E6/uL — ABNORMAL LOW (ref 4.14–5.80)
RDW: 13.8 % (ref 11.6–15.4)
WBC: 7.7 10*3/uL (ref 3.4–10.8)

## 2018-12-31 LAB — LIPID PANEL
Chol/HDL Ratio: 6.2 ratio — ABNORMAL HIGH (ref 0.0–5.0)
Cholesterol, Total: 142 mg/dL (ref 100–199)
HDL: 23 mg/dL — ABNORMAL LOW (ref >39)
LDL Calculated: 86 mg/dL (ref 0–99)
Triglycerides: 167 mg/dL — ABNORMAL HIGH (ref 0–149)
VLDL Cholesterol Cal: 33 mg/dL (ref 5–40)

## 2019-01-01 NOTE — Progress Notes (Signed)
BP (!) 142/84   Pulse 69   Temp 98.4 F (36.9 C) (Tympanic)   Ht '5\' 6"'$  (1.676 m)   Wt 212 lb 9.6 oz (96.4 kg)   BMI 34.31 kg/m    Subjective:    Patient ID: Billy Rasmussen, male    DOB: [birth year], 73 y.o.   MRN: [id number]  HPI: Billy Rasmussen is a 73 y.o. male presenting on 12/30/2018 for Hospitalization Follow-up  Patient comes in for hospital follow-up from the urine UTI and septicemia he received after having his last bladder surgery for his cancer.  He reports that overall he is doing much better just extremely tired and has not built back up his stamina yet.  He had lost about 40 pounds over the past couple of months.  He has had good stabilization of his blood pressure, pulse, temperature and labs.  Of course he still follows up with urology and will be seeing them soon.  He has no other complaints at this time.  I have reviewed his labs and we will bring him up-to-date with his general labs today.  Past Medical History:  Diagnosis Date  . Arthritis    knees, shoulders, elbows  . Bladder cancer [facility])    urologist-  dr Junious Silk  . Diverticulosis of colon   . Fibromyalgia   . GERD (gastroesophageal reflux disease)   . History of blood transfusion    age 30   . History of bronchitis   . History of diverticulitis of colon   . History of gastric ulcer    due to aleve  . Hypertension   . Incomplete left bundle branch block    told had irregular heartbeat 12-28-18-19, no referral to cardiology made  . Lower urinary tract symptoms (LUTS)   . OSA (obstructive sleep apnea)    NON-COMPLIANT  CPAP  --- BUT PT USES OXYGEN AT NIGHT 2.5L VIA [place] (PT'S DECISION)  . PONV (postoperative nausea and vomiting)   . Psoriasis    occ outbreaks on eyebrows chin and head, none recent  . Tinnitus    right ear more, has tranmitter in right ear removable at hs  . Urinary frequency   . Wears glasses   . Wears partial dentures    Relevant past medical, surgical, family and social  history reviewed and updated as indicated. Interim medical history since our last visit reviewed. Allergies and medications reviewed and updated. DATA REVIEWED: CHART IN EPIC  Family History reviewed for pertinent findings.  Review of Systems  Constitutional: Negative.  Negative for appetite change and fatigue.  Eyes: Negative for pain and visual disturbance.  Respiratory: Negative.  Negative for cough, chest tightness, shortness of breath and wheezing.   Cardiovascular: Negative.  Negative for chest pain, palpitations and leg swelling.  Gastrointestinal: Positive for abdominal pain. Negative for diarrhea, nausea and vomiting.  Genitourinary: Negative.   Skin: Negative.  Negative for color change and rash.  Neurological: Negative.  Negative for weakness, numbness and headaches.  Psychiatric/Behavioral: Negative.     Allergies as of 12/30/2018   No Known Allergies     Medication List       Accurate as of December 30, 2018 11:59 PM. If you have any questions, ask your nurse or doctor.        diclofenac 75 MG EC tablet Commonly known as: VOLTAREN Take 1 tablet (75 mg total) by mouth 2 (two) times daily. What changed:   how much to take  when  to take this   docusate sodium 100 MG capsule Commonly known as: COLACE Take 200 mg by mouth daily.   linezolid 600 MG tablet Commonly known as: ZYVOX Take 1 tablet (600 mg total) by mouth every 12 (twelve) hours for 8 days.   lisinopril-hydrochlorothiazide 20-12.5 MG tablet Commonly known as: ZESTORETIC Take 1 tablet by mouth daily.   oxyCODONE 5 MG immediate release tablet Commonly known as: Oxy IR/ROXICODONE Take 1 tablet (5 mg total) by mouth every 4 (four) hours as needed for moderate pain or severe pain. Post-Operatively   polyethylene glycol 17 g packet Commonly known as: MIRALAX / GLYCOLAX Take 17 g by mouth daily as needed for mild constipation.   senna-docusate 8.6-50 MG tablet Commonly known as: Senokot-S Take 1  tablet by mouth 2 (two) times daily.          Objective:    BP (!) 142/84   Pulse 69   Temp 98.4 F (36.9 C) (Tympanic)   Ht '5\' 6"'$  (1.676 m)   Wt 212 lb 9.6 oz (96.4 kg)   BMI 34.31 kg/m   No Known Allergies  Wt Readings from Last 3 Encounters:  12/30/18 212 lb 9.6 oz (96.4 kg)  12/22/18 215 lb (97.5 kg)  11/09/18 234 lb (106.1 kg)    Physical Exam Vitals signs and nursing note reviewed.  Constitutional:      General: He is not in acute distress.    Appearance: He is well-developed.  HENT:     Head: Normocephalic and atraumatic.  Eyes:     Conjunctiva/sclera: Conjunctivae normal.     Pupils: Pupils are equal, round, and reactive to light.  Cardiovascular:     Rate and Rhythm: Normal rate and regular rhythm.     Heart sounds: Normal heart sounds.  Pulmonary:     Effort: Pulmonary effort is normal. No respiratory distress.     Breath sounds: Normal breath sounds.  Skin:    General: Skin is warm and dry.  Psychiatric:        Behavior: Behavior normal.     Results for orders placed or performed in visit on 12/30/18  CBC with Differential/Platelet  Result Value Ref Range   WBC 7.7 3.4 - 10.8 x10E3/uL   RBC 4.05 (L) 4.14 - 5.80 x10E6/uL   Hemoglobin 11.7 (L) 13.0 - 17.7 g/dL   Hematocrit 36.7 (L) 37.5 - 51.0 %   MCV 91 79 - 97 fL   MCH 28.9 26.6 - 33.0 pg   MCHC 31.9 31.5 - 35.7 g/dL   RDW 13.8 11.6 - 15.4 %   Platelets 332 150 - 450 x10E3/uL   Neutrophils 77 Not Estab. %   Lymphs 14 Not Estab. %   Monocytes 6 Not Estab. %   Eos 2 Not Estab. %   Basos 0 Not Estab. %   Neutrophils Absolute 5.9 1.4 - 7.0 x10E3/uL   Lymphocytes Absolute 1.1 0.7 - 3.1 x10E3/uL   Monocytes Absolute 0.5 0.1 - 0.9 x10E3/uL   EOS (ABSOLUTE) 0.2 0.0 - 0.4 x10E3/uL   Basophils Absolute 0.0 0.0 - 0.2 x10E3/uL   Immature Granulocytes 1 Not Estab. %   Immature Grans (Abs) 0.1 0.0 - 0.1 x10E3/uL  CMP14+EGFR  Result Value Ref Range   Glucose 95 65 - 99 mg/dL   BUN 19 8 - 27 mg/dL    Creatinine, Ser 1.07 0.76 - 1.27 mg/dL   GFR calc non Af Amer 69 >59 mL/min/1.73   GFR calc Af Amer 80 >59  mL/min/1.73   BUN/Creatinine Ratio 18 10 - 24   Sodium 140 134 - 144 mmol/L   Potassium 5.0 3.5 - 5.2 mmol/L   Chloride 99 96 - 106 mmol/L   CO2 26 20 - 29 mmol/L   Calcium 9.8 8.6 - 10.2 mg/dL   Total Protein 7.2 6.0 - 8.5 g/dL   Albumin 4.3 3.7 - 4.7 g/dL   Globulin, Total 2.9 1.5 - 4.5 g/dL   Albumin/Globulin Ratio 1.5 1.2 - 2.2   Bilirubin Total 0.4 0.0 - 1.2 mg/dL   Alkaline Phosphatase 76 39 - 117 IU/L   AST 36 0 - 40 IU/L   ALT 21 0 - 44 IU/L  Lipid panel  Result Value Ref Range   Cholesterol, Total 142 100 - 199 mg/dL   Triglycerides 167 (H) 0 - 149 mg/dL   HDL 23 (L) >39 mg/dL   VLDL Cholesterol Cal 33 5 - 40 mg/dL   LDL Calculated 86 0 - 99 mg/dL   Chol/HDL Ratio 6.2 (H) 0.0 - 5.0 ratio      Assessment & Plan:   1. Malignant neoplasm of urinary bladder, unspecified site (HCC) - CBC with Differential/Platelet  2. Sepsis with acute organ dysfunction, due to unspecified organism, unspecified type, unspecified whether septic shock present ([place]) - CBC with Differential/Platelet - CMP14+EGFR  3. Well adult exam - Lipid panel   Continue all other maintenance medications as listed above.  Follow up plan: No follow-ups on file.  Educational handout given for [place] PA-C [place] [address]  [place], [place] [address] [phone]   01/01/2019, 8:25 PM

## 2019-01-02 ENCOUNTER — Other Ambulatory Visit: Payer: Self-pay

## 2019-01-02 ENCOUNTER — Encounter (HOSPITAL_COMMUNITY): Payer: Self-pay

## 2019-01-02 ENCOUNTER — Inpatient Hospital Stay (HOSPITAL_COMMUNITY)
Admission: EM | Admit: 2019-01-02 | Discharge: 2019-01-06 | DRG: 871 | Disposition: A | Discharge status: Home Health | Payer: Medicare Other | Attending: Student | Admitting: Student

## 2019-01-02 ENCOUNTER — Emergency Department (HOSPITAL_COMMUNITY): Payer: Medicare Other

## 2019-01-02 DIAGNOSIS — Z20828 Contact with and (suspected) exposure to other viral communicable diseases: Secondary | ICD-10-CM | POA: Diagnosis present

## 2019-01-02 DIAGNOSIS — E7800 Pure hypercholesterolemia, unspecified: Secondary | ICD-10-CM | POA: Diagnosis present

## 2019-01-02 DIAGNOSIS — A419 Sepsis, unspecified organism: Secondary | ICD-10-CM | POA: Diagnosis present

## 2019-01-02 DIAGNOSIS — Z9119 Patient's noncompliance with other medical treatment and regimen: Secondary | ICD-10-CM

## 2019-01-02 DIAGNOSIS — Z96653 Presence of artificial knee joint, bilateral: Secondary | ICD-10-CM | POA: Diagnosis present

## 2019-01-02 DIAGNOSIS — D649 Anemia, unspecified: Secondary | ICD-10-CM | POA: Diagnosis present

## 2019-01-02 DIAGNOSIS — R6520 Severe sepsis without septic shock: Secondary | ICD-10-CM | POA: Diagnosis not present

## 2019-01-02 DIAGNOSIS — N131 Hydronephrosis with ureteral stricture, not elsewhere classified: Secondary | ICD-10-CM | POA: Diagnosis not present

## 2019-01-02 DIAGNOSIS — I471 Supraventricular tachycardia: Secondary | ICD-10-CM | POA: Diagnosis present

## 2019-01-02 DIAGNOSIS — M19022 Primary osteoarthritis, left elbow: Secondary | ICD-10-CM | POA: Diagnosis present

## 2019-01-02 DIAGNOSIS — L409 Psoriasis, unspecified: Secondary | ICD-10-CM | POA: Diagnosis present

## 2019-01-02 DIAGNOSIS — Z936 Other artificial openings of urinary tract status: Secondary | ICD-10-CM | POA: Diagnosis not present

## 2019-01-02 DIAGNOSIS — K219 Gastro-esophageal reflux disease without esophagitis: Secondary | ICD-10-CM | POA: Diagnosis present

## 2019-01-02 DIAGNOSIS — D72825 Bandemia: Secondary | ICD-10-CM | POA: Diagnosis not present

## 2019-01-02 DIAGNOSIS — Z96612 Presence of left artificial shoulder joint: Secondary | ICD-10-CM | POA: Diagnosis present

## 2019-01-02 DIAGNOSIS — N390 Urinary tract infection, site not specified: Secondary | ICD-10-CM | POA: Diagnosis not present

## 2019-01-02 DIAGNOSIS — N12 Tubulo-interstitial nephritis, not specified as acute or chronic: Secondary | ICD-10-CM | POA: Diagnosis not present

## 2019-01-02 DIAGNOSIS — Z87891 Personal history of nicotine dependence: Secondary | ICD-10-CM

## 2019-01-02 DIAGNOSIS — K5730 Diverticulosis of large intestine without perforation or abscess without bleeding: Secondary | ICD-10-CM | POA: Diagnosis present

## 2019-01-02 DIAGNOSIS — Z820 Family history of epilepsy and other diseases of the nervous system: Secondary | ICD-10-CM

## 2019-01-02 DIAGNOSIS — Z452 Encounter for adjustment and management of vascular access device: Secondary | ICD-10-CM

## 2019-01-02 DIAGNOSIS — N136 Pyonephrosis: Secondary | ICD-10-CM | POA: Diagnosis present

## 2019-01-02 DIAGNOSIS — M150 Primary generalized (osteo)arthritis: Secondary | ICD-10-CM | POA: Diagnosis not present

## 2019-01-02 DIAGNOSIS — N10 Acute tubulo-interstitial nephritis: Secondary | ICD-10-CM | POA: Diagnosis not present

## 2019-01-02 DIAGNOSIS — Z9981 Dependence on supplemental oxygen: Secondary | ICD-10-CM

## 2019-01-02 DIAGNOSIS — K660 Peritoneal adhesions (postprocedural) (postinfection): Secondary | ICD-10-CM | POA: Diagnosis present

## 2019-01-02 DIAGNOSIS — Z833 Family history of diabetes mellitus: Secondary | ICD-10-CM

## 2019-01-02 DIAGNOSIS — C679 Malignant neoplasm of bladder, unspecified: Secondary | ICD-10-CM | POA: Diagnosis not present

## 2019-01-02 DIAGNOSIS — E876 Hypokalemia: Secondary | ICD-10-CM | POA: Diagnosis not present

## 2019-01-02 DIAGNOSIS — R7881 Bacteremia: Secondary | ICD-10-CM | POA: Diagnosis not present

## 2019-01-02 DIAGNOSIS — R0682 Tachypnea, not elsewhere classified: Secondary | ICD-10-CM | POA: Diagnosis not present

## 2019-01-02 DIAGNOSIS — N1330 Unspecified hydronephrosis: Secondary | ICD-10-CM

## 2019-01-02 DIAGNOSIS — M797 Fibromyalgia: Secondary | ICD-10-CM | POA: Diagnosis present

## 2019-01-02 DIAGNOSIS — Z8551 Personal history of malignant neoplasm of bladder: Secondary | ICD-10-CM

## 2019-01-02 DIAGNOSIS — M19021 Primary osteoarthritis, right elbow: Secondary | ICD-10-CM | POA: Diagnosis present

## 2019-01-02 DIAGNOSIS — I10 Essential (primary) hypertension: Secondary | ICD-10-CM | POA: Diagnosis present

## 2019-01-02 DIAGNOSIS — A4152 Sepsis due to Pseudomonas: Secondary | ICD-10-CM | POA: Diagnosis not present

## 2019-01-02 DIAGNOSIS — R509 Fever, unspecified: Secondary | ICD-10-CM | POA: Diagnosis not present

## 2019-01-02 DIAGNOSIS — Z8249 Family history of ischemic heart disease and other diseases of the circulatory system: Secondary | ICD-10-CM

## 2019-01-02 DIAGNOSIS — R000 Tachycardia, unspecified: Secondary | ICD-10-CM | POA: Diagnosis not present

## 2019-01-02 DIAGNOSIS — G4733 Obstructive sleep apnea (adult) (pediatric): Secondary | ICD-10-CM | POA: Diagnosis present

## 2019-01-02 DIAGNOSIS — Z96611 Presence of right artificial shoulder joint: Secondary | ICD-10-CM | POA: Diagnosis present

## 2019-01-02 DIAGNOSIS — I447 Left bundle-branch block, unspecified: Secondary | ICD-10-CM | POA: Diagnosis present

## 2019-01-02 DIAGNOSIS — N132 Hydronephrosis with renal and ureteral calculous obstruction: Secondary | ICD-10-CM | POA: Diagnosis not present

## 2019-01-02 DIAGNOSIS — G9341 Metabolic encephalopathy: Secondary | ICD-10-CM | POA: Diagnosis present

## 2019-01-02 DIAGNOSIS — Z9079 Acquired absence of other genital organ(s): Secondary | ICD-10-CM

## 2019-01-02 LAB — CBC WITH DIFFERENTIAL/PLATELET
Abs Immature Granulocytes: 0.15 10*3/uL — ABNORMAL HIGH (ref 0.00–0.07)
Basophils Absolute: 0.1 10*3/uL (ref 0.0–0.1)
Basophils Relative: 0 %
Eosinophils Absolute: 0 10*3/uL (ref 0.0–0.5)
Eosinophils Relative: 0 %
HCT: 41.8 % (ref 39.0–52.0)
Hemoglobin: 13.1 g/dL (ref 13.0–17.0)
Immature Granulocytes: 1 %
Lymphocytes Relative: 2 %
Lymphs Abs: 0.4 10*3/uL — ABNORMAL LOW (ref 0.7–4.0)
MCH: 28.7 pg (ref 26.0–34.0)
MCHC: 31.3 g/dL (ref 30.0–36.0)
MCV: 91.5 fL (ref 80.0–100.0)
Monocytes Absolute: 1.1 10*3/uL — ABNORMAL HIGH (ref 0.1–1.0)
Monocytes Relative: 5 %
Neutro Abs: 21.1 10*3/uL — ABNORMAL HIGH (ref 1.7–7.7)
Neutrophils Relative %: 92 %
Platelets: 336 10*3/uL (ref 150–400)
RBC: 4.57 MIL/uL (ref 4.22–5.81)
RDW: 14.3 % (ref 11.5–15.5)
WBC: 22.8 10*3/uL — ABNORMAL HIGH (ref 4.0–10.5)
nRBC: 0 % (ref 0.0–0.2)

## 2019-01-02 LAB — CBC
HCT: 34 % — ABNORMAL LOW (ref 39.0–52.0)
Hemoglobin: 10.7 g/dL — ABNORMAL LOW (ref 13.0–17.0)
MCH: 28.9 pg (ref 26.0–34.0)
MCHC: 31.5 g/dL (ref 30.0–36.0)
MCV: 91.9 fL (ref 80.0–100.0)
Platelets: 243 10*3/uL (ref 150–400)
RBC: 3.7 MIL/uL — ABNORMAL LOW (ref 4.22–5.81)
RDW: 14.6 % (ref 11.5–15.5)
WBC: 18.2 10*3/uL — ABNORMAL HIGH (ref 4.0–10.5)
nRBC: 0 % (ref 0.0–0.2)

## 2019-01-02 LAB — URINALYSIS, ROUTINE W REFLEX MICROSCOPIC
Bilirubin Urine: NEGATIVE
Glucose, UA: NEGATIVE mg/dL
Ketones, ur: NEGATIVE mg/dL
Nitrite: POSITIVE — AB
Protein, ur: 30 mg/dL — AB
Specific Gravity, Urine: 1.012 (ref 1.005–1.030)
WBC, UA: >50 WBC/hpf — ABNORMAL HIGH (ref 0–5)
pH: 7 (ref 5.0–8.0)

## 2019-01-02 LAB — COMPREHENSIVE METABOLIC PANEL
ALT: 22 U/L (ref 0–44)
AST: 34 U/L (ref 15–41)
Albumin: 4.1 g/dL (ref 3.5–5.0)
Alkaline Phosphatase: 72 U/L (ref 38–126)
Anion gap: 13 (ref 5–15)
BUN: 17 mg/dL (ref 8–23)
CO2: 25 mmol/L (ref 22–32)
Calcium: 9.8 mg/dL (ref 8.9–10.3)
Chloride: 98 mmol/L (ref 98–111)
Creatinine, Ser: 1.07 mg/dL (ref 0.61–1.24)
GFR calc Af Amer: >60 mL/min (ref >60)
GFR calc non Af Amer: >60 mL/min (ref >60)
Glucose, Bld: 149 mg/dL — ABNORMAL HIGH (ref 70–99)
Potassium: 3.8 mmol/L (ref 3.5–5.1)
Sodium: 136 mmol/L (ref 135–145)
Total Bilirubin: 1.1 mg/dL (ref 0.3–1.2)
Total Protein: 8.4 g/dL — ABNORMAL HIGH (ref 6.5–8.1)

## 2019-01-02 LAB — LIPASE, BLOOD: Lipase: 27 U/L (ref 11–51)

## 2019-01-02 LAB — PROTIME-INR
INR: 1.2 (ref 0.8–1.2)
Prothrombin Time: 15.2 seconds (ref 11.4–15.2)

## 2019-01-02 LAB — SARS CORONAVIRUS 2 BY RT PCR (HOSPITAL ORDER, PERFORMED IN ~~LOC~~ HOSPITAL LAB): SARS Coronavirus 2: NEGATIVE

## 2019-01-02 LAB — TROPONIN I (HIGH SENSITIVITY)
Troponin I (High Sensitivity): 8 ng/L (ref <18)
Troponin I (High Sensitivity): 12 ng/L (ref <18)

## 2019-01-02 LAB — APTT: aPTT: 37 seconds — ABNORMAL HIGH (ref 24–36)

## 2019-01-02 LAB — LACTIC ACID, PLASMA: Lactic Acid, Venous: 1.3 mmol/L (ref 0.5–1.9)

## 2019-01-02 MED ORDER — ALVIMOPAN 12 MG PO CAPS
12.0000 mg | ORAL_CAPSULE | ORAL | Status: AC
  Filled 2019-01-02: qty 1

## 2019-01-02 MED ORDER — SODIUM CHLORIDE 0.9 % IV SOLN
2.0000 g | Freq: Three times a day (TID) | INTRAVENOUS | Status: DC
  Administered 2019-01-02 – 2019-01-06 (×11): 2 g via INTRAVENOUS
  Filled 2019-01-02 (×16): qty 2

## 2019-01-02 MED ORDER — ONDANSETRON HCL 4 MG PO TABS: 4.0000 mg | ORAL_TABLET | Freq: Four times a day (QID) | ORAL | Status: DC | PRN

## 2019-01-02 MED ORDER — LABETALOL HCL 5 MG/ML IV SOLN
10.0000 mg | INTRAVENOUS | Status: DC | PRN
  Administered 2019-01-03 – 2019-01-04 (×3): 10 mg via INTRAVENOUS
  Filled 2019-01-02 (×4): qty 4

## 2019-01-02 MED ORDER — OXYCODONE HCL 5 MG PO TABS
5.0000 mg | ORAL_TABLET | ORAL | Status: DC | PRN
  Administered 2019-01-04 – 2019-01-06 (×5): 5 mg via ORAL
  Filled 2019-01-02 (×5): qty 1

## 2019-01-02 MED ORDER — ACETAMINOPHEN 325 MG PO TABS
650.0000 mg | ORAL_TABLET | Freq: Four times a day (QID) | ORAL | Status: DC | PRN
  Administered 2019-01-02 – 2019-01-06 (×7): 650 mg via ORAL
  Filled 2019-01-02 (×7): qty 2

## 2019-01-02 MED ORDER — ENOXAPARIN SODIUM 40 MG/0.4ML ~~LOC~~ SOLN
40.0000 mg | SUBCUTANEOUS | Status: DC
  Administered 2019-01-03: 40 mg via SUBCUTANEOUS
  Filled 2019-01-02 (×3): qty 0.4

## 2019-01-02 MED ORDER — METRONIDAZOLE IN NACL 5-0.79 MG/ML-% IV SOLN
500.0000 mg | Freq: Once | INTRAVENOUS | Status: AC
  Administered 2019-01-02: 500 mg via INTRAVENOUS
  Filled 2019-01-02: qty 100

## 2019-01-02 MED ORDER — IOHEXOL 300 MG/ML  SOLN
100.0000 mL | Freq: Once | INTRAMUSCULAR | Status: AC | PRN
  Administered 2019-01-02: 100 mL via INTRAVENOUS

## 2019-01-02 MED ORDER — SODIUM CHLORIDE 0.9 % IV BOLUS (SEPSIS)
1000.0000 mL | Freq: Once | INTRAVENOUS | Status: AC
  Administered 2019-01-02: 1000 mL via INTRAVENOUS

## 2019-01-02 MED ORDER — VANCOMYCIN HCL IN DEXTROSE 1-5 GM/200ML-% IV SOLN: 1000.0000 mg | Freq: Once | INTRAVENOUS | Status: DC

## 2019-01-02 MED ORDER — DEXTROSE-NACL 5-0.45 % IV SOLN
INTRAVENOUS | Status: AC
  Administered 2019-01-02: 18:00:00 via INTRAVENOUS

## 2019-01-02 MED ORDER — SODIUM CHLORIDE (PF) 0.9 % IJ SOLN
INTRAMUSCULAR | Status: AC
  Administered 2019-01-02: 10 mL
  Filled 2019-01-02: qty 50

## 2019-01-02 MED ORDER — TRAZODONE HCL 50 MG PO TABS
25.0000 mg | ORAL_TABLET | Freq: Every evening | ORAL | Status: DC | PRN
  Administered 2019-01-03: 25 mg via ORAL
  Filled 2019-01-02: qty 1

## 2019-01-02 MED ORDER — ACETAMINOPHEN 650 MG RE SUPP
650.0000 mg | Freq: Four times a day (QID) | RECTAL | Status: DC | PRN
  Filled 2019-01-02: qty 1

## 2019-01-02 MED ORDER — SENNA 8.6 MG PO TABS
1.0000 | ORAL_TABLET | Freq: Two times a day (BID) | ORAL | Status: DC
  Administered 2019-01-03 – 2019-01-06 (×6): 8.6 mg via ORAL
  Filled 2019-01-02 (×6): qty 1

## 2019-01-02 MED ORDER — SODIUM CHLORIDE 0.9 % IV SOLN
2.0000 g | Freq: Once | INTRAVENOUS | Status: AC
  Administered 2019-01-02: 2 g via INTRAVENOUS
  Filled 2019-01-02: qty 2

## 2019-01-02 MED ORDER — VANCOMYCIN HCL 10 G IV SOLR
2000.0000 mg | Freq: Once | INTRAVENOUS | Status: AC
  Administered 2019-01-02: 2000 mg via INTRAVENOUS
  Filled 2019-01-02: qty 2000

## 2019-01-02 MED ORDER — ONDANSETRON HCL 4 MG/2ML IJ SOLN: 4.0000 mg | Freq: Four times a day (QID) | INTRAMUSCULAR | Status: DC | PRN

## 2019-01-02 MED ORDER — LISINOPRIL-HYDROCHLOROTHIAZIDE 20-12.5 MG PO TABS: 1.0000 | ORAL_TABLET | Freq: Every day | ORAL | Status: DC

## 2019-01-02 MED ORDER — VANCOMYCIN HCL 10 G IV SOLR
1250.0000 mg | INTRAVENOUS | Status: DC
  Administered 2019-01-03: 1250 mg via INTRAVENOUS
  Filled 2019-01-02 (×2): qty 1250

## 2019-01-02 MED ORDER — METRONIDAZOLE IN NACL 5-0.79 MG/ML-% IV SOLN
500.0000 mg | Freq: Three times a day (TID) | INTRAVENOUS | Status: DC
  Administered 2019-01-03 – 2019-01-04 (×5): 500 mg via INTRAVENOUS
  Filled 2019-01-02 (×5): qty 100

## 2019-01-02 MED ORDER — POLYETHYLENE GLYCOL 3350 17 G PO PACK: 17.0000 g | PACK | Freq: Every day | ORAL | Status: DC | PRN

## 2019-01-02 MED ORDER — ALBUTEROL SULFATE (2.5 MG/3ML) 0.083% IN NEBU: 2.5000 mg | INHALATION_SOLUTION | RESPIRATORY_TRACT | Status: DC | PRN
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
## 2019-01-02 NOTE — Progress Notes (Signed)
Pharmacy Antibiotic Note  Billy Rasmussen is a 73 y.o. male admitted on 01/02/2019 with sepsis.  Pt had previous ileal conduit and recently admitted for pyelonephritis. Pharmacy has been consulted for vanc and cefepime dosing.  Plan: -Vancomycin 1250mg  IV q24h (AUC 502.5, Scr 1.07) a 2gm load was given earlier today - cefepime 2gm IV q8h - follow renal function, cultures and clinical course  Height: 5\' 6"  (167.6 cm) Weight: 211 lb 10.3 oz (96 kg) IBW/kg (Calculated) : 63.8  Temp (24hrs), Avg:103 F (39.4 C), Min:102.8 F (39.3 C), Max:103.1 F (39.5 C)  Recent Labs  Lab 12/30/18 1133 01/02/19 1323  WBC 7.7 22.8*  CREATININE 1.07 1.07  LATICACIDVEN  --  1.3    Estimated Creatinine Clearance: 68.7 mL/min (by C-G formula based on SCr of 1.07 mg/dL).    No Known Allergies  Antimicrobials this admission: 8/3 vanc >> 8/3 cefepime >> Dose adjustments this admission:   Microbiology results: 8/3 BCx:  8/3 UCx:   Thank you for allowing pharmacy to be a part of this patient's care.  Dolly Rias RPh 01/02/2019, 6:18 PM Pager [phone]

## 2019-01-02 NOTE — ED Provider Notes (Signed)
  Physical Exam  BP (!) 141/77   Pulse (!) 110   Temp (!) 102.8 F (39.3 C) (Rectal)   Resp (!) 28   Ht 5\' 6"  (1.676 m)   Wt 96 kg   SpO2 99%   BMI 34.16 kg/m   Physical Exam  ED Course/Procedures     Procedures  MDM  Patient care assumed at 4 PM .  Patient had previous ileal conduit.  Patient was recently admitted for pyelonephritis and had a postop seroma.  Urology saw the patient and thought that patient does need IV antibiotics at that time.  Patient came here for fever and abdominal pain. White count was 22,000 and CT was pending at sign out. Received broad spectrum abx already.   5:48 PM CT showed L pyelonephritis, seroma resolved. Talked to Dr. Lovena Neighbours from urology. Doesn't recommend any particular intervention other than IV abx. Hospitalist to admit.       Drenda Freeze, MD 01/02/19 [phone]
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
## 2019-01-02 NOTE — Progress Notes (Signed)
A consult was received from an ED physician for vanc/cefepime per pharmacy dosing.  The patient's profile has been reviewed for ht/wt/allergies/indication/available labs.   A one time order has been placed for vanc 2g and cefepime 2g.  Further antibiotics/pharmacy consults should be ordered by admitting physician if indicated.                       Thank you, Kara Mead 01/02/2019  1:10 PM
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
## 2019-01-03 DIAGNOSIS — E876 Hypokalemia: Secondary | ICD-10-CM

## 2019-01-03 DIAGNOSIS — Z936 Other artificial openings of urinary tract status: Secondary | ICD-10-CM

## 2019-01-03 LAB — BASIC METABOLIC PANEL WITH GFR
Anion gap: 10 (ref 5–15)
BUN: 16 mg/dL (ref 8–23)
CO2: 23 mmol/L (ref 22–32)
Calcium: 8.8 mg/dL — ABNORMAL LOW (ref 8.9–10.3)
Chloride: 102 mmol/L (ref 98–111)
Creatinine, Ser: 1.1 mg/dL (ref 0.61–1.24)
GFR calc Af Amer: >60 mL/min
GFR calc non Af Amer: >60 mL/min
Glucose, Bld: 137 mg/dL — ABNORMAL HIGH (ref 70–99)
Potassium: 3.3 mmol/L — ABNORMAL LOW (ref 3.5–5.1)
Sodium: 135 mmol/L (ref 135–145)

## 2019-01-03 LAB — BLOOD CULTURE ID PANEL (REFLEXED)

## 2019-01-03 LAB — CBC
HCT: 34.3 % — ABNORMAL LOW (ref 39.0–52.0)
Hemoglobin: 10.9 g/dL — ABNORMAL LOW (ref 13.0–17.0)
MCH: 29.3 pg (ref 26.0–34.0)
MCHC: 31.8 g/dL (ref 30.0–36.0)
MCV: 92.2 fL (ref 80.0–100.0)
Platelets: 239 10*3/uL (ref 150–400)
RBC: 3.72 MIL/uL — ABNORMAL LOW (ref 4.22–5.81)
RDW: 14.6 % (ref 11.5–15.5)
WBC: 15.7 10*3/uL — ABNORMAL HIGH (ref 4.0–10.5)
nRBC: 0 % (ref 0.0–0.2)

## 2019-01-03 LAB — MRSA PCR SCREENING: MRSA by PCR: NEGATIVE

## 2019-01-03 LAB — MAGNESIUM: Magnesium: 1.7 mg/dL (ref 1.7–2.4)

## 2019-01-03 MED ORDER — ORAL CARE MOUTH RINSE
15.0000 mL | Freq: Two times a day (BID) | OROMUCOSAL | Status: DC
  Administered 2019-01-03 – 2019-01-05 (×2): 15 mL via OROMUCOSAL

## 2019-01-03 MED ORDER — HYDROCHLOROTHIAZIDE 12.5 MG PO CAPS
12.5000 mg | ORAL_CAPSULE | Freq: Every day | ORAL | Status: DC
  Administered 2019-01-04 – 2019-01-05 (×2): 12.5 mg via ORAL
  Filled 2019-01-03 (×2): qty 1

## 2019-01-03 MED ORDER — POTASSIUM CHLORIDE 10 MEQ/100ML IV SOLN
10.0000 meq | INTRAVENOUS | Status: AC
  Administered 2019-01-03 (×5): 10 meq via INTRAVENOUS
  Filled 2019-01-03 (×5): qty 100

## 2019-01-03 MED ORDER — DEXTROSE-NACL 5-0.45 % IV SOLN
INTRAVENOUS | Status: DC
  Administered 2019-01-03 – 2019-01-04 (×2): via INTRAVENOUS

## 2019-01-03 MED ORDER — CHLORHEXIDINE GLUCONATE CLOTH 2 % EX PADS
6.0000 | MEDICATED_PAD | Freq: Every day | CUTANEOUS | Status: DC
  Administered 2019-01-03: 6 via TOPICAL

## 2019-01-03 MED ORDER — POTASSIUM CHLORIDE CRYS ER 20 MEQ PO TBCR: 40.0000 meq | EXTENDED_RELEASE_TABLET | ORAL | Status: DC

## 2019-01-03 MED ORDER — LISINOPRIL 20 MG PO TABS
20.0000 mg | ORAL_TABLET | Freq: Every day | ORAL | Status: DC
  Administered 2019-01-04 – 2019-01-05 (×2): 20 mg via ORAL
  Filled 2019-01-03: qty 2
  Filled 2019-01-03: qty 1

## 2019-01-03 NOTE — ED Notes (Signed)
ED TO INPATIENT HANDOFF REPORT  Name/Age/Gender Billy Rasmussen 73 y.o. male  Code Status    Code Status Orders  (From admission, onward)         Start     Ordered   01/02/19 1800  Full code  Continuous     01/02/19 1759        Code Status History    Date Active Date Inactive Code Status Order ID Comments User Context   12/22/2018 1750 12/25/2018 2007 Full Code [id number]  Barb Merino, MD ED   11/09/2018 1624 11/16/2018 1718 Full Code [id number]  Lattie Corns Inpatient   03/30/2016 1933 03/31/2016 1636 Full Code [id number]  Norman Herrlich Inpatient   07/20/2013 1410 07/21/2013 1329 Full Code [id number]  Marcellus Scott Inpatient   04/06/2013 1518 04/07/2013 1359 Full Code [id number]  Jenetta Loges, PA-C Inpatient   Advance Care Planning Activity      Home/SNF/Other Home  Chief Complaint Fever 103  Level of Care/Admitting Diagnosis ED Disposition    ED Disposition Condition [name]: [facility] [[id number]]  Level of Care: Stepdown [14]  Admit to SDU based on following criteria: Hemodynamic compromise or significant risk of instability:  Patient requiring short term acute titration and management of vasoactive drips, and invasive monitoring (i.e., CVP and Arterial line).  Covid Evaluation: Confirmed COVID Negative  Diagnosis: Sepsis [facility]) [[id number]]  Admitting Physician: Mercy Riding [[id number]]  Attending Physician: Mercy Riding [[id number]]  Estimated length of stay: past midnight tomorrow  Certification:: I certify this patient will need inpatient services for at least 2 midnights  PT Class (Do Not Modify): Inpatient [101]  PT Acc Code (Do Not Modify): Private [1]       Medical History Past Medical History:  Diagnosis Date  . Arthritis    knees, shoulders, elbows  . Bladder cancer [facility])    urologist-  dr Junious Silk  . Diverticulosis of colon   . Fibromyalgia   . GERD (gastroesophageal reflux disease)   .  History of blood transfusion    age 9   . History of bronchitis   . History of diverticulitis of colon   . History of gastric ulcer    due to aleve  . Hypertension   . Incomplete left bundle branch block    told had irregular heartbeat 12-28-18-19, no referral to cardiology made  . Lower urinary tract symptoms (LUTS)   . OSA (obstructive sleep apnea)    NON-COMPLIANT  CPAP  --- BUT PT USES OXYGEN AT NIGHT 2.5L VIA [place] (PT'S DECISION)  . PONV (postoperative nausea and vomiting)   . Psoriasis    occ outbreaks on eyebrows chin and head, none recent  . Tinnitus    right ear more, has tranmitter in right ear removable at hs  . Urinary frequency   . Wears glasses   . Wears partial dentures     Allergies No Known Allergies  IV Location/Drains/Wounds Patient Lines/Drains/Airways Status   Active Line/Drains/Airways    Name:   Placement date:   Placement time:   Site:   Days:   Peripheral IV 01/02/19 Left Forearm   01/02/19    1324    Forearm   1   Peripheral IV 01/02/19 Right Antecubital   01/02/19    1334    Antecubital   1   Urostomy Ileal conduit RUQ   11/09/18    1437    RUQ   55  Ureteral Drain/Stent Left ureter 7 Fr.   11/09/18    1338    Left ureter   55   Ureteral Drain/Stent Right ureter 7 Fr.   11/09/18    1400    Right ureter   55   Incision (Closed) 01/18/18 Perineum   01/18/18    0825     350   Incision (Closed) 11/09/18 Abdomen   11/09/18    1003     55   Incision - 4 Ports Abdomen 1: Right;Lateral 2: Right;Medial 3: Left;Medial 4: Left;Lateral   11/09/18    0900     55          Labs/Imaging Results for orders placed or performed during the hospital encounter of 01/02/19 (from the past 48 hour(s))  Lactic acid, plasma     Status: None   Collection Time: 01/02/19  1:23 PM  Result Value Ref Range   Lactic Acid, Venous 1.3 0.5 - 1.9 mmol/L    Comment: Performed at [facility], [place] [address], [place], [place] [address]  Comprehensive metabolic  panel     Status: Abnormal   Collection Time: 01/02/19  1:23 PM  Result Value Ref Range   Sodium 136 135 - 145 mmol/L   Potassium 3.8 3.5 - 5.1 mmol/L   Chloride 98 98 - 111 mmol/L   CO2 25 22 - 32 mmol/L   Glucose, Bld 149 (H) 70 - 99 mg/dL   BUN 17 8 - 23 mg/dL   Creatinine, Ser 1.07 0.61 - 1.24 mg/dL   Calcium 9.8 8.9 - 10.3 mg/dL   Total Protein 8.4 (H) 6.5 - 8.1 g/dL   Albumin 4.1 3.5 - 5.0 g/dL   AST 34 15 - 41 U/L   ALT 22 0 - 44 U/L   Alkaline Phosphatase 72 38 - 126 U/L   Total Bilirubin 1.1 0.3 - 1.2 mg/dL   GFR calc non Af Amer >60 >60 mL/min   GFR calc Af Amer >60 >60 mL/min   Anion gap 13 5 - 15    Comment: Performed at [facility], [place] [address], [place], [place] [address]  CBC WITH DIFFERENTIAL     Status: Abnormal   Collection Time: 01/02/19  1:23 PM  Result Value Ref Range   WBC 22.8 (H) 4.0 - 10.5 K/uL   RBC 4.57 4.22 - 5.81 MIL/uL   Hemoglobin 13.1 13.0 - 17.0 g/dL   HCT 41.8 39.0 - 52.0 %   MCV 91.5 80.0 - 100.0 fL   MCH 28.7 26.0 - 34.0 pg   MCHC 31.3 30.0 - 36.0 g/dL   RDW 14.3 11.5 - 15.5 %   Platelets 336 150 - 400 K/uL   nRBC 0.0 0.0 - 0.2 %   Neutrophils Relative % 92 %   Neutro Abs 21.1 (H) 1.7 - 7.7 K/uL   Lymphocytes Relative 2 %   Lymphs Abs 0.4 (L) 0.7 - 4.0 K/uL   Monocytes Relative 5 %   Monocytes Absolute 1.1 (H) 0.1 - 1.0 K/uL   Eosinophils Relative 0 %   Eosinophils Absolute 0.0 0.0 - 0.5 K/uL   Basophils Relative 0 %   Basophils Absolute 0.1 0.0 - 0.1 K/uL   Immature Granulocytes 1 %   Abs Immature Granulocytes 0.15 (H) 0.00 - 0.07 K/uL    Comment: Performed at [facility], [place] [address] [place], [place] [address]  APTT     Status: Abnormal   Collection Time:  01/02/19  1:23 PM  Result Value Ref Range   aPTT 37 (H) 24 - 36 seconds    Comment:        IF BASELINE aPTT IS ELEVATED, SUGGEST PATIENT RISK ASSESSMENT BE USED TO DETERMINE APPROPRIATE ANTICOAGULANT THERAPY. Performed at  [facility], [place] [address], [place], [place] [address]   Protime-INR     Status: None   Collection Time: 01/02/19  1:23 PM  Result Value Ref Range   Prothrombin Time 15.2 11.4 - 15.2 seconds   INR 1.2 0.8 - 1.2    Comment: (NOTE) INR goal varies based on device and disease states. Performed at [facility], [place] [address] [place], [place] [address]   Urinalysis, Routine w reflex microscopic     Status: Abnormal   Collection Time: 01/02/19  1:23 PM  Result Value Ref Range   Color, Urine YELLOW YELLOW   APPearance HAZY (A) CLEAR   Specific Gravity, Urine 1.012 1.005 - 1.030   pH 7.0 5.0 - 8.0   Glucose, UA NEGATIVE NEGATIVE mg/dL   Hgb urine dipstick SMALL (A) NEGATIVE   Bilirubin Urine NEGATIVE NEGATIVE   Ketones, ur NEGATIVE NEGATIVE mg/dL   Protein, ur 30 (A) NEGATIVE mg/dL   Nitrite POSITIVE (A) NEGATIVE   Leukocytes,Ua LARGE (A) NEGATIVE   RBC / HPF 11-20 0 - 5 RBC/hpf   WBC, UA >50 (H) 0 - 5 WBC/hpf   Bacteria, UA MANY (A) NONE SEEN   Squamous Epithelial / LPF 0-5 0 - 5   Budding Yeast PRESENT     Comment: Performed at [facility], [place] [address], [place], [place] [address]  Lipase, blood     Status: None   Collection Time: 01/02/19  1:23 PM  Result Value Ref Range   Lipase 27 11 - 51 U/L    Comment: Performed at [facility], [place] [address], [place], [place] [address]  Troponin I (High Sensitivity)     Status: None   Collection Time: 01/02/19  1:23 PM  Result Value Ref Range   Troponin I (High Sensitivity) 8 <18 ng/L    Comment: (NOTE) Elevated high sensitivity troponin I (hsTnI) values and significant  changes across serial measurements may suggest ACS but many other  chronic and acute conditions are known to elevate hsTnI results.  Refer to the "Links" section for chest pain algorithms and additional  guidance. Performed at [facility], [place] [address] [place],  [address]   SARS Coronavirus 2 [facility] order, Performed in [facility] hospital lab) Nasopharyngeal Nasopharyngeal Swab     Status: None   Collection Time: 01/02/19  1:47 PM   Specimen: Nasopharyngeal Swab  Result Value Ref Range   SARS Coronavirus 2 NEGATIVE NEGATIVE    Comment: (NOTE) If result is NEGATIVE SARS-CoV-2 target nucleic acids are NOT DETECTED. The SARS-CoV-2 RNA is generally detectable in upper and lower  respiratory specimens during the acute phase of infection. The lowest  concentration of SARS-CoV-2 viral copies this assay can detect is 250  copies / mL. A negative result does not preclude SARS-CoV-2 infection  and should not be used as the sole basis for treatment or other  patient management decisions.  A negative result may occur with  improper specimen collection / handling, submission of specimen other  than nasopharyngeal swab, presence of viral mutation(s) within the  areas targeted by this assay, and inadequate number of viral copies  (<250 copies /  mL). A negative result must be combined with clinical  observations, patient history, and epidemiological information. If result is POSITIVE SARS-CoV-2 target nucleic acids are DETECTED. The SARS-CoV-2 RNA is generally detectable in upper and lower  respiratory specimens dur ing the acute phase of infection.  Positive  results are indicative of active infection with SARS-CoV-2.  Clinical  correlation with patient history and other diagnostic information is  necessary to determine patient infection status.  Positive results do  not rule out bacterial infection or co-infection with other viruses. If result is PRESUMPTIVE POSTIVE SARS-CoV-2 nucleic acids MAY BE PRESENT.   A presumptive positive result was obtained on the submitted specimen  and confirmed on repeat testing.  While 2019 novel coronavirus  (SARS-CoV-2) nucleic acids may be present in the submitted sample  additional confirmatory  testing may be necessary for epidemiological  and / or clinical management purposes  to differentiate between  SARS-CoV-2 and other Sarbecovirus currently known to infect humans.  If clinically indicated additional testing with an alternate test  methodology [id number]) is advised. The SARS-CoV-2 RNA is generally  detectable in upper and lower respiratory sp ecimens during the acute  phase of infection. The expected result is Negative. Fact Sheet for Patients:  StrictlyIdeas.no Fact Sheet for Healthcare Providers: BankingDealers.co.za This test is not yet approved or cleared by the [place] FDA and has been authorized for detection and/or diagnosis of SARS-CoV-2 by FDA under an Emergency Use Authorization (EUA).  This EUA will remain in effect (meaning this test can be used) for the duration of the COVID-19 declaration under Section 564(b)(1) of the Act, 21 U.S.C. section 360bbb-3(b)(1), unless the authorization is terminated or revoked sooner. Performed at [facility], [place] [address] [place], [place] [address]   Troponin I (High Sensitivity)     Status: None   Collection Time: 01/02/19  3:02 PM  Result Value Ref Range   Troponin I (High Sensitivity) 12 <18 ng/L    Comment: (NOTE) Elevated high sensitivity troponin I (hsTnI) values and significant  changes across serial measurements may suggest ACS but many other  chronic and acute conditions are known to elevate hsTnI results.  Refer to the "Links" section for chest pain algorithms and additional  guidance. Performed at [facility], [place] [address] [place], [place] [address]   CBC     Status: Abnormal   Collection Time: 01/02/19 11:05 PM  Result Value Ref Range   WBC 18.2 (H) 4.0 - 10.5 K/uL   RBC 3.70 (L) 4.22 - 5.81 MIL/uL   Hemoglobin 10.7 (L) 13.0 - 17.0 g/dL   HCT 34.0 (L) 39.0 - 52.0 %   MCV 91.9 80.0 - 100.0 fL   MCH 28.9 26.0  - 34.0 pg   MCHC 31.5 30.0 - 36.0 g/dL   RDW 14.6 11.5 - 15.5 %   Platelets 243 150 - 400 K/uL   nRBC 0.0 0.0 - 0.2 %    Comment: Performed at [facility], [place] [address], [place], [place] [address]  Creatinine, serum     Status: None   Collection Time: 01/02/19 11:05 PM  Result Value Ref Range   Creatinine, Ser 1.10 0.61 - 1.24 mg/dL   GFR calc non Af Amer >60 >60 mL/min   GFR calc Af Amer >60 >60 mL/min    Comment: Performed at [facility], [place] [address], [place], [place] [address]   Ct Abdomen Pelvis W Contrast  Result Date: 01/02/2019 CLINICAL  DATA:  73 year old with fevers and sepsis. History of bladder cancer and status post radical cystoprostatectomy with ileal conduit. EXAM: CT ABDOMEN AND PELVIS WITH CONTRAST TECHNIQUE: Multidetector CT imaging of the abdomen and pelvis was performed using the standard protocol following bolus administration of intravenous contrast. CONTRAST:  152mL OMNIPAQUE IOHEXOL 300 MG/ML  SOLN COMPARISON:  CT 12/22/2018 FINDINGS: Lower chest: Lung bases are clear.  No large pleural effusions. Hepatobiliary: Numerous calcified gallstones. No gallbladder distention and no gallbladder inflammatory changes. Normal appearance of the liver without biliary dilatation. Pancreas: Unremarkable. No pancreatic ductal dilatation or surrounding inflammatory changes. Spleen: Normal in size without focal abnormality. Adrenals/Urinary Tract: Normal adrenal glands. Normal appearance of the right kidney without hydronephrosis. History of radical cystoprostatectomy with ileal conduit. Increased left perinephric edema with at least mild left hydroureteronephrosis. Left ureter is dilated to the region of the surgical anastomosis. Delayed excretion of contrast from the left renal collecting system. Stomach/Bowel: Normal appearance of the stomach and duodenum. Patient has a urinary diversion with an ileal conduit. No gross abnormality at the ileal  conduit. Expected postoperative changes in the bowel. No evidence for focal bowel inflammation or bowel obstruction. Vascular/Lymphatic: Atherosclerotic calcifications in the aorta and iliac arteries without aneurysm. Main visceral arteries are patent. There is no significant abdominopelvic lymphadenopathy. Reproductive: Radical cystoprostatectomy. Again noted is a small amount of fluid at the surgical bed. Postoperative fluid collection measures 7.2 x 4.3 x 4.9 cm and previously measured 7.4 x 4.7 x 6.4 cm. No gas within the fluid collection. Other: Negative for free air.  Negative for ascites. Musculoskeletal: Degenerative facet disease in lower lumbar spine. Irregularity along superior endplate at L3 is stable. No acute bone abnormality. IMPRESSION: 1. Increased inflammatory changes centered around the left kidney with persistent left hydroureteronephrosis. Findings remain concerning for pyelonephritis but there is also concern for an underlying left ureter anastomotic stricture. 2. Cholelithiasis without acute inflammatory changes. 3. Postsurgical changes related to radical cystoprostatectomy. Postoperative fluid collection in the pelvis has slightly decreased in size and there is no gas within the collection. Findings are most compatible with a resolving postoperative fluid collection rather than an abscess. Electronically Signed   By: Markus Daft M.D.   On: 01/02/2019 16:46   Dg Chest Port 1 View  Result Date: 01/02/2019 CLINICAL DATA:  Fever since yesterday.  Altered mental status. EXAM: PORTABLE CHEST 1 VIEW COMPARISON:  Radiographs 12/22/2018 and 02/25/2016. FINDINGS: 1320 hours. The heart size and mediastinal contours are normal. The lungs are clear. There is no pleural effusion or pneumothorax. No acute osseous findings are identified. Patient is status post bilateral total shoulder arthroplasty. Telemetry leads overlie the chest. IMPRESSION: Stable chest without evidence of active cardiopulmonary  process. Electronically Signed   By: Richardean Sale M.D.   On: 01/02/2019 13:59    Pending Labs Unresulted Labs (From admission, onward)    Start     Ordered   01/09/19 0500  Creatinine, serum  (enoxaparin (LOVENOX)    CrCl >/= 30 ml/min)  Weekly,   R    Comments: while on enoxaparin therapy    01/02/19 2209   01/03/19 0500  CBC  Tomorrow morning,   R     01/02/19 2209   01/03/19 2563  Basic metabolic panel  Tomorrow morning,   R     01/02/19 2209   01/02/19 1259  Blood Culture (routine x 2)  BLOOD CULTURE X 2,   STAT     01/02/19 1301   01/02/19 1259  Urine culture  ONCE - STAT,   STAT     01/02/19 1301          Vitals/Pain Today's Vitals   01/03/19 0030 01/03/19 0100 01/03/19 0122 01/03/19 0122  BP: (!) 114/56 125/68    Pulse: 100 99    Resp: (!) 26 (!) 27    Temp: (!) 101.4 F (38.6 C)   99.7 F (37.6 C)  TempSrc: Oral   Oral  SpO2: 94% 94%    Weight:      Height:      PainSc:   0-No pain     Isolation Precautions No active isolations  Medications Medications  metroNIDAZOLE (FLAGYL) IVPB 500 mg ( Intravenous Rate/Dose Verify 01/03/19 0121)  lisinopril-hydrochlorothiazide (ZESTORETIC) 20-12.5 MG per tablet 1 tablet (has no administration in time range)  enoxaparin (LOVENOX) injection 40 mg (40 mg Subcutaneous Not Given 01/03/19 0032)  dextrose 5 %-0.45 % sodium chloride infusion ( Intravenous Rate/Dose Verify 01/03/19 0121)  acetaminophen (TYLENOL) tablet 650 mg (650 mg Oral Given 01/03/19 0036)    Or  acetaminophen (TYLENOL) suppository 650 mg ( Rectal See Alternative 01/03/19 0036)  oxyCODONE (Oxy IR/ROXICODONE) immediate release tablet 5 mg (has no administration in time range)  traZODone (DESYREL) tablet 25 mg (has no administration in time range)  senna (SENOKOT) tablet 8.6 mg (8.6 mg Oral Given 01/03/19 0036)  polyethylene glycol (MIRALAX / GLYCOLAX) packet 17 g (has no administration in time range)  ondansetron (ZOFRAN) tablet 4 mg (has no administration in time  range)    Or  ondansetron (ZOFRAN) injection 4 mg (has no administration in time range)  albuterol (PROVENTIL) (2.5 MG/3ML) 0.083% nebulizer solution 2.5 mg (has no administration in time range)  vancomycin (VANCOCIN) 1,250 mg in sodium chloride 0.9 % 250 mL IVPB (has no administration in time range)  ceFEPIme (MAXIPIME) 2 g in sodium chloride 0.9 % 100 mL IVPB ( Intravenous Stopped 01/02/19 2301)  labetalol (NORMODYNE) injection 10 mg (has no administration in time range)  alvimopan (ENTEREG) capsule 12 mg (has no administration in time range)  sodium chloride 0.9 % bolus 1,000 mL (0 mLs Intravenous Stopped 01/02/19 1454)    And  sodium chloride 0.9 % bolus 1,000 mL (0 mLs Intravenous Stopped 01/02/19 1554)    And  sodium chloride 0.9 % bolus 1,000 mL (0 mLs Intravenous Stopped 01/02/19 1554)  ceFEPIme (MAXIPIME) 2 g in sodium chloride 0.9 % 100 mL IVPB (0 g Intravenous Stopped 01/02/19 1401)  metroNIDAZOLE (FLAGYL) IVPB 500 mg (0 mg Intravenous Stopped 01/02/19 1454)  vancomycin (VANCOCIN) 2,000 mg in sodium chloride 0.9 % 500 mL IVPB (0 mg Intravenous Stopped 01/02/19 1718)  iohexol (OMNIPAQUE) 300 MG/ML solution 100 mL (100 mLs Intravenous Contrast Given 01/02/19 1609)  sodium chloride (PF) 0.9 % injection (10 mLs  Given 01/02/19 1932)    Mobility walks

## 2019-01-03 NOTE — Progress Notes (Signed)
PHARMACY - PHYSICIAN COMMUNICATION CRITICAL VALUE ALERT - BLOOD CULTURE IDENTIFICATION (BCID)  Billy Rasmussen is an 72 y.o. male with recurrent cystitis and recently hospitalized and discharged on 7/26 with a prescription for 8 days of zyvox for staph hemolyticus/enterococcus faecalis UTI.  He presented to the ED on 8/3 with c/o fever and chills. She's currently on vancomycin, cefepime and flagyl for sepsis/pyelo.  Name of physician (or Provider) Contacted: Dr. Cyndia Skeeters  Current antibiotics: vancomycin, cefepime, metronidazole  Changes to prescribed antibiotics recommended:  Per Dr. Cyndia Skeeters, patient is not very sick and would like to continue with current abx for now  Results for orders placed or performed during the hospital encounter of 01/02/19  Blood Culture ID Panel (Reflexed) (Collected: 01/02/2019  1:23 PM)  Result Value Ref Range   Enterococcus species NOT DETECTED NOT DETECTED   Listeria monocytogenes NOT DETECTED NOT DETECTED   Staphylococcus species NOT DETECTED NOT DETECTED   Staphylococcus aureus (BCID) NOT DETECTED NOT DETECTED   Streptococcus species NOT DETECTED NOT DETECTED   Streptococcus agalactiae NOT DETECTED NOT DETECTED   Streptococcus pneumoniae NOT DETECTED NOT DETECTED   Streptococcus pyogenes NOT DETECTED NOT DETECTED   Acinetobacter baumannii NOT DETECTED NOT DETECTED   Enterobacteriaceae species NOT DETECTED NOT DETECTED   Enterobacter cloacae complex NOT DETECTED NOT DETECTED   Escherichia coli NOT DETECTED NOT DETECTED   Klebsiella oxytoca NOT DETECTED NOT DETECTED   Klebsiella pneumoniae NOT DETECTED NOT DETECTED   Proteus species NOT DETECTED NOT DETECTED   Serratia marcescens NOT DETECTED NOT DETECTED   Carbapenem resistance NOT DETECTED NOT DETECTED   Haemophilus influenzae NOT DETECTED NOT DETECTED   Neisseria meningitidis NOT DETECTED NOT DETECTED   Pseudomonas aeruginosa DETECTED (A) NOT DETECTED   Candida albicans NOT DETECTED NOT DETECTED   Candida glabrata NOT DETECTED NOT DETECTED   Candida krusei NOT DETECTED NOT DETECTED   Candida parapsilosis NOT DETECTED NOT DETECTED   Candida tropicalis NOT DETECTED NOT DETECTED    [name] P 01/03/2019  2:01 PM

## 2019-01-03 NOTE — Plan of Care (Signed)
  Problem: Education: Goal: Knowledge of General Education information will improve Description Including pain rating scale, medication(s)/side effects and non-pharmacologic comfort measures Outcome: Progressing   

## 2019-01-03 NOTE — Progress Notes (Signed)
PROGRESS NOTE  Billy Rasmussen:[id number] DOB: [birth year]   PCP: Terald Sleeper, PA-C  Patient is from: Home  DOA: 01/02/2019 LOS: 1  Brief Narrative / Interim history: 73 y.o. male with history of bladder cancer status post radical cystoprostatectomy on 11/09/2018 with ileal conduit and lymph node resection, hypertension and osteoarthritis returning with fever, malaise and chills for 1 day.   Recently hospitalized 7/23-7/26 for sepsis due to acute pyelonephritis.  Urine culture at that time grew Staphylococcus hemolyticus and enterococcus faecalis.  He was discharged on Zyvox to complete 10 days course but stopped 4 days early.  In ED, febrile to 103 F.  Tachycardic to 110s.  Tachypneic to upper 20s.  SBP ranges from 142-201.  DBP ranges from 70s to 120s.  Last blood pressure 142/108.  CBC remarkable for leukocytosis to 22.8 with bandemia.  CMP not impressive.  Lactic acid 1.3.  INR 1.2.  High-sensitivity troponin 8>15.  EKG junctional tachycardia with LAD but no acute ischemic finding.  COVID-19 negative.  UA concerning for UTI.  CXR without acute finding.  CT A/P revealed persistent left hydroureteronephrosis, left pyelonephritis, underlying left ureter anastomotic stricture, cholelithiasis without cholecystitis and resolving postoperative fluid collection within pelvis from prior radical cystoprostatectomy.  Urology consulted. Resuscitated with 3 L of normal saline bolus.  Blood and urine culture sent.  Started on vancomycin, cefepime and Flagyl, and hospital service was called for admission.  Subjective: No major events overnight of this morning.  Remains febrile, tachycardic and tachypneic but improved.  Blood pressure and leukocytosis improved.  Somewhat sleepy but arises easily and responds to question appropriately.  Reports feeling better.  Denies headache, chest pain, dyspnea and abdominal pain  Objective: Vitals:   01/03/19 0600 01/03/19 0634 01/03/19 0700 01/03/19 0800  BP: (!)  147/83  (!) 157/93   Pulse: (!) 106 (!) 105 100   Resp: (!) 29 (!) 27 (!) 26   Temp:  (!) 102.3 F (39.1 C)  98.7 F (37.1 C)  TempSrc:  Oral  Oral  SpO2: 94% 96% 96%   Weight:      Height:        Intake/Output Summary (Last 24 hours) at 01/03/2019 1048 Last data filed at 01/03/2019 0700 Gross per 24 hour  Intake 4840.38 ml  Output 2700 ml  Net 2140.38 ml   Filed Weights   01/02/19 1221 01/02/19 1455 01/03/19 0206  Weight: 96 kg 96 kg 95.4 kg    Examination:  GENERAL: No acute distress.  Appears well.  HEENT: MMM.  Vision and hearing grossly intact.  NECK: Supple.  No apparent JVD.  RESP:  No IWOB. Good air movement bilaterally. CVS:  RRR. Heart sounds normal.  ABD/GI/GU: Bowel sounds present. Soft. Non tender.  Urostomy bag over RLQ.  About 300 cc urine in urine bag. MSK/EXT:  Moves extremities. No apparent deformity or edema.  SKIN: no apparent skin lesion or wound NEURO: Sleepy but arises easily and responds to question appropriately.  No gross deficit.  PSYCH: Calm. Normal affect.  I have personally reviewed the following labs and images:  Radiology Studies: Ct Abdomen Pelvis W Contrast  Result Date: 01/02/2019 CLINICAL DATA:  73 year old with fevers and sepsis. History of bladder cancer and status post radical cystoprostatectomy with ileal conduit. EXAM: CT ABDOMEN AND PELVIS WITH CONTRAST TECHNIQUE: Multidetector CT imaging of the abdomen and pelvis was performed using the standard protocol following bolus administration of intravenous contrast. CONTRAST:  112mL OMNIPAQUE IOHEXOL 300 MG/ML  SOLN COMPARISON:  CT 12/22/2018 FINDINGS: Lower chest: Lung bases are clear.  No large pleural effusions. Hepatobiliary: Numerous calcified gallstones. No gallbladder distention and no gallbladder inflammatory changes. Normal appearance of the liver without biliary dilatation. Pancreas: Unremarkable. No pancreatic ductal dilatation or surrounding inflammatory changes. Spleen: Normal in  size without focal abnormality. Adrenals/Urinary Tract: Normal adrenal glands. Normal appearance of the right kidney without hydronephrosis. History of radical cystoprostatectomy with ileal conduit. Increased left perinephric edema with at least mild left hydroureteronephrosis. Left ureter is dilated to the region of the surgical anastomosis. Delayed excretion of contrast from the left renal collecting system. Stomach/Bowel: Normal appearance of the stomach and duodenum. Patient has a urinary diversion with an ileal conduit. No gross abnormality at the ileal conduit. Expected postoperative changes in the bowel. No evidence for focal bowel inflammation or bowel obstruction. Vascular/Lymphatic: Atherosclerotic calcifications in the aorta and iliac arteries without aneurysm. Main visceral arteries are patent. There is no significant abdominopelvic lymphadenopathy. Reproductive: Radical cystoprostatectomy. Again noted is a small amount of fluid at the surgical bed. Postoperative fluid collection measures 7.2 x 4.3 x 4.9 cm and previously measured 7.4 x 4.7 x 6.4 cm. No gas within the fluid collection. Other: Negative for free air.  Negative for ascites. Musculoskeletal: Degenerative facet disease in lower lumbar spine. Irregularity along superior endplate at L3 is stable. No acute bone abnormality. IMPRESSION: 1. Increased inflammatory changes centered around the left kidney with persistent left hydroureteronephrosis. Findings remain concerning for pyelonephritis but there is also concern for an underlying left ureter anastomotic stricture. 2. Cholelithiasis without acute inflammatory changes. 3. Postsurgical changes related to radical cystoprostatectomy. Postoperative fluid collection in the pelvis has slightly decreased in size and there is no gas within the collection. Findings are most compatible with a resolving postoperative fluid collection rather than an abscess. Electronically Signed   By: Markus Daft M.D.   On:  01/02/2019 16:46   Dg Chest Port 1 View  Result Date: 01/02/2019 CLINICAL DATA:  Fever since yesterday.  Altered mental status. EXAM: PORTABLE CHEST 1 VIEW COMPARISON:  Radiographs 12/22/2018 and 02/25/2016. FINDINGS: 1320 hours. The heart size and mediastinal contours are normal. The lungs are clear. There is no pleural effusion or pneumothorax. No acute osseous findings are identified. Patient is status post bilateral total shoulder arthroplasty. Telemetry leads overlie the chest. IMPRESSION: Stable chest without evidence of active cardiopulmonary process. Electronically Signed   By: Richardean Sale M.D.   On: 01/02/2019 13:59    Microbiology: Recent Results (from the past 240 hour(s))  Blood Culture (routine x 2)     Status: None (Preliminary result)   Collection Time: 01/02/19  1:23 PM   Specimen: Site Not Specified; Blood  Result Value Ref Range Status   Specimen Description   Final    SITE NOT SPECIFIED Performed at [facility] [address], [place], [place] [address]    Special Requests   Final    BOTTLES DRAWN AEROBIC AND ANAEROBIC Blood Culture adequate volume Performed at [facility] [address] [place], [place] [address]    Culture   Final    NO GROWTH < 24 HOURS Performed at [facility] [address] [place], [place] [address]    Report Status PENDING  Incomplete  Blood Culture (routine x 2)     Status: None (Preliminary result)   Collection Time: 01/02/19  1:23 PM   Specimen: Site Not Specified; Blood  Result Value Ref Range Status   Specimen  Description   Final    SITE NOT SPECIFIED Performed at [facility] [address] [place], [place] [address]    Special Requests   Final    BOTTLES DRAWN AEROBIC AND ANAEROBIC Blood Culture adequate volume Performed at [facility] [address], [place], [place] [address]    Culture   Final    NO GROWTH < 24 HOURS Performed at  [facility] [address] [place], [place] [address]    Report Status PENDING  Incomplete  Urine culture     Status: Abnormal (Preliminary result)   Collection Time: 01/02/19  1:23 PM   Specimen: In/Out Cath Urine  Result Value Ref Range Status   Specimen Description   Final    IN/OUT CATH URINE Performed at [facility] [address], [place], [place] [address]    Special Requests   Final    NONE Performed at [facility], [place] [address] [place],  [address]    Culture >=100,000 COLONIES/mL GRAM NEGATIVE RODS (A)  Final   Report Status PENDING  Incomplete  SARS Coronavirus 2 [facility] order, Performed in [facility] hospital lab) Nasopharyngeal Nasopharyngeal Swab     Status: None   Collection Time: 01/02/19  1:47 PM   Specimen: Nasopharyngeal Swab  Result Value Ref Range Status   SARS Coronavirus 2 NEGATIVE NEGATIVE Final    Comment: (NOTE) If result is NEGATIVE SARS-CoV-2 target nucleic acids are NOT DETECTED. The SARS-CoV-2 RNA is generally detectable in upper and lower  respiratory specimens during the acute phase of infection. The lowest  concentration of SARS-CoV-2 viral copies this assay can detect is 250  copies / mL. A negative result does not preclude SARS-CoV-2 infection  and should not be used as the sole basis for treatment or other  patient management decisions.  A negative result may occur with  improper specimen collection / handling, submission of specimen other  than nasopharyngeal swab, presence of viral mutation(s) within the  areas targeted by this assay, and inadequate number of viral copies  (<250 copies / mL). A negative result must be combined with clinical  observations, patient history, and epidemiological information. If result is POSITIVE SARS-CoV-2 target nucleic acids are DETECTED. The SARS-CoV-2 RNA is generally detectable in upper and lower  respiratory specimens dur ing the acute phase  of infection.  Positive  results are indicative of active infection with SARS-CoV-2.  Clinical  correlation with patient history and other diagnostic information is  necessary to determine patient infection status.  Positive results do  not rule out bacterial infection or co-infection with other viruses. If result is PRESUMPTIVE POSTIVE SARS-CoV-2 nucleic acids MAY BE PRESENT.   A presumptive positive result was obtained on the submitted specimen  and confirmed on repeat testing.  While 2019 novel coronavirus  (SARS-CoV-2) nucleic acids may be present in the submitted sample  additional confirmatory testing may be necessary for epidemiological  and / or clinical management purposes  to differentiate between  SARS-CoV-2 and other Sarbecovirus currently known to infect humans.  If clinically indicated additional testing with an alternate test  methodology [phone]) is advised. The SARS-CoV-2 RNA is generally  detectable in upper and lower respiratory sp ecimens during the acute  phase of infection. The expected result is Negative. Fact Sheet for Patients:  StrictlyIdeas.no Fact Sheet for Healthcare Providers: BankingDealers.co.za This test is not yet approved or cleared by the [place] FDA and has been  authorized for detection and/or diagnosis of SARS-CoV-2 by FDA under an Emergency Use Authorization (EUA).  This EUA will remain in effect (meaning this test can be used) for the duration of the COVID-19 declaration under Section 564(b)(1) of the Act, 21 U.S.C. section 360bbb-3(b)(1), unless the authorization is terminated or revoked sooner. Performed at [facility], [place] [address], [place], [place] [address]   MRSA PCR Screening     Status: None   Collection Time: 01/03/19  2:00 AM   Specimen: Nasopharyngeal Wash  Result Value Ref Range Status   MRSA by PCR NEGATIVE NEGATIVE Final    Comment:        The  GeneXpert MRSA Assay (FDA approved for NASAL specimens only), is one component of a comprehensive MRSA colonization surveillance program. It is not intended to diagnose MRSA infection nor to guide or monitor treatment for MRSA infections. Performed at [facility], [place] [address], [place], [place] [address]     Sepsis Labs: Invalid input(s): PROCALCITONIN, LACTICIDVEN  Urine analysis:    Component Value Date/Time   COLORURINE YELLOW 01/02/2019 1323   APPEARANCEUR HAZY (A) 01/02/2019 1323   APPEARANCEUR Cloudy (A) 01/27/2018 1625   LABSPEC 1.012 01/02/2019 1323   PHURINE 7.0 01/02/2019 1323   GLUCOSEU NEGATIVE 01/02/2019 1323   HGBUR SMALL (A) 01/02/2019 1323   BILIRUBINUR NEGATIVE 01/02/2019 1323   BILIRUBINUR Negative 01/27/2018 1625   KETONESUR NEGATIVE 01/02/2019 1323   PROTEINUR 30 (A) 01/02/2019 1323   NITRITE POSITIVE (A) 01/02/2019 1323   LEUKOCYTESUR LARGE (A) 01/02/2019 1323    Anemia Panel: No results for input(s): VITAMINB12, FOLATE, FERRITIN, TIBC, IRON, RETICCTPCT in the last 72 hours.  Thyroid Function Tests: No results for input(s): TSH, T4TOTAL, FREET4, T3FREE, THYROIDAB in the last 72 hours.  Lipid Profile: No results for input(s): CHOL, HDL, LDLCALC, TRIG, CHOLHDL, LDLDIRECT in the last 72 hours.  CBG: No results for input(s): GLUCAP in the last 168 hours.  HbA1C: No results for input(s): HGBA1C in the last 72 hours.  BNP (last 3 results): No results for input(s): PROBNP in the last 8760 hours.  Cardiac Enzymes: No results for input(s): CKTOTAL, CKMB, CKMBINDEX, TROPONINI in the last 168 hours.  Coagulation Profile: Recent Labs  Lab 01/02/19 1323  INR 1.2    Liver Function Tests: Recent Labs  Lab 12/30/18 1133 01/02/19 1323  AST 36 34  ALT 21 22  ALKPHOS 76 72  BILITOT 0.4 1.1  PROT 7.2 8.4*  ALBUMIN 4.3 4.1   Recent Labs  Lab 01/02/19 1323  LIPASE 27   No results for input(s): AMMONIA in the last 168  hours.  Basic Metabolic Panel: Recent Labs  Lab 12/30/18 1133 01/02/19 1323 01/02/19 2305 01/03/19 0223  NA 140 136  --  135  K 5.0 3.8  --  3.3*  CL 99 98  --  102  CO2 26 25  --  23  GLUCOSE 95 149*  --  137*  BUN 19 17  --  16  CREATININE 1.07 1.07 1.10 1.10  CALCIUM 9.8 9.8  --  8.8*  MG  --   --   --  1.7   GFR: Estimated Creatinine Clearance: 66.6 mL/min (by C-G formula based on SCr of 1.1 mg/dL).  CBC: Recent Labs  Lab 12/30/18 1133 01/02/19 1323 01/02/19 2305 01/03/19 0223  WBC 7.7 22.8* 18.2* 15.7*  NEUTROABS 5.9 21.1*  --   --   HGB 11.7* 13.1 10.7* 10.9*  HCT 36.7*  41.8 34.0* 34.3*  MCV 91 91.5 91.9 92.2  PLT 332 336 243 239    Procedures:  None  Microbiology summarized: COVID-19 negative. Blood culture GNR on Gram stain. Urine culture GNR greater than 1000 colonies.  Assessment & Plan: Sepsis due to pyelonephritis/complicated UTI/possible GNR bacteremia: POA -Patient with history of bladder ca s/p radical cystoprostatectomy and ileal conduit on 11/09/2018 -Recent urine culture grew Staphylococcus hemolyticus and enterococcus -UA concerning for GNR UTI. -Blood culture GNR on Gram stain. -CT A/P-persistent left hydroureteronephrosis, left pyelonephritis & left ureter anastomotic stricture.  -Ileal conduit patent on CT abdomen and pelvis. -Appreciate urology input.  Plan for nephrostomy tube today. -Continue vancomycin, cefepime and Flagyl pending culture speciation and sensitivity -Continue D5-0.45NS at 100 cc/h  Acute metabolic encephalopathy: Likely due to the above.    Improved. -Fall and aspiration precaution -Treat sepsis as above.  Hypertension: BP elevated in ED but improved.  -Continue home lisinopril/HCTZ. -PRN labetalol  Fever and leukocytosis with bandemia: Improved. -Likely due to #1  Tachypnea and tachycardia: CXR without acute finding.  EKG features junctional tachycardia. -Likely due to #1.   Hypokalemia: Magnesium was  normal range. -Replenish with IV KCl.  Normocytic anemia: Likely hemo-dilutional.  May be hemoconcentrated on arrival. -Continue monitoring  DVT prophylaxis: Subcu Lovenox Code Status: Full code Family Communication: Patient and/or RN. Available if any question.  Updated patient's wife on 8/3 Disposition Plan: Remains inpatient.  Stepdown bed Consultants: Neurology   Antimicrobials: Anti-infectives (From admission, onward)   Start     Dose/Rate Route Frequency Ordered Stop   01/03/19 1400  vancomycin (VANCOCIN) 1,250 mg in sodium chloride 0.9 % 250 mL IVPB     1,250 mg 166.7 mL/hr over 90 Minutes Intravenous Every 24 hours 01/02/19 1813     01/02/19 2315  metroNIDAZOLE (FLAGYL) IVPB 500 mg     500 mg 100 mL/hr over 60 Minutes Intravenous Every 8 hours 01/02/19 2302     01/02/19 2200  ceFEPIme (MAXIPIME) 2 g in sodium chloride 0.9 % 100 mL IVPB     2 g 200 mL/hr over 30 Minutes Intravenous Every 8 hours 01/02/19 1813     01/02/19 1315  ceFEPIme (MAXIPIME) 2 g in sodium chloride 0.9 % 100 mL IVPB     2 g 200 mL/hr over 30 Minutes Intravenous  Once 01/02/19 1301 01/02/19 1401   01/02/19 1315  metroNIDAZOLE (FLAGYL) IVPB 500 mg     500 mg 100 mL/hr over 60 Minutes Intravenous  Once 01/02/19 1301 01/02/19 1454   01/02/19 1315  vancomycin (VANCOCIN) IVPB 1000 mg/200 mL premix  Status:  Discontinued     1,000 mg 200 mL/hr over 60 Minutes Intravenous  Once 01/02/19 1301 01/02/19 1309   01/02/19 1315  vancomycin (VANCOCIN) 2,000 mg in sodium chloride 0.9 % 500 mL IVPB     2,000 mg 250 mL/hr over 120 Minutes Intravenous  Once 01/02/19 1309 01/02/19 1718      Sch Meds:  Scheduled Meds: . alvimopan  12 mg Oral On Call to OR  . Chlorhexidine Gluconate Cloth  6 each Topical Q0600  . enoxaparin (LOVENOX) injection  40 mg Subcutaneous Q24H  . lisinopril  20 mg Oral Daily   And  . hydrochlorothiazide  12.5 mg Oral Daily  . mouth rinse  15 mL Mouth Rinse BID  . senna  1 tablet Oral BID    Continuous Infusions: . ceFEPime (MAXIPIME) IV Stopped (01/03/19 3818)  . metronidazole Stopped (01/03/19 0733)  . potassium chloride  10 mEq (01/03/19 0944)  . vancomycin     PRN Meds:.acetaminophen **OR** acetaminophen, albuterol, labetalol, ondansetron **OR** ondansetron (ZOFRAN) IV, oxyCODONE, polyethylene glycol, traZODone  35 minutes with more than 50% spent in reviewing records, counseling patient and coordinating care.  Dannilynn Gallina T. [place]  If 7PM-7AM, please contact night-coverage [email or web address] Password [email or web address] 01/03/2019, 10:48 AM

## 2019-01-04 LAB — URINE CULTURE: Culture: >=100000 — AB

## 2019-01-04 LAB — BASIC METABOLIC PANEL
Anion gap: 7 (ref 5–15)
BUN: 16 mg/dL (ref 8–23)
CO2: 24 mmol/L (ref 22–32)
Calcium: 9 mg/dL (ref 8.9–10.3)
Chloride: 106 mmol/L (ref 98–111)
Creatinine, Ser: 0.95 mg/dL (ref 0.61–1.24)
GFR calc Af Amer: >60 mL/min (ref >60)
GFR calc non Af Amer: >60 mL/min (ref >60)
Glucose, Bld: 116 mg/dL — ABNORMAL HIGH (ref 70–99)
Potassium: 3.8 mmol/L (ref 3.5–5.1)
Sodium: 137 mmol/L (ref 135–145)

## 2019-01-04 LAB — CBC
HCT: 35.5 % — ABNORMAL LOW (ref 39.0–52.0)
Hemoglobin: 10.8 g/dL — ABNORMAL LOW (ref 13.0–17.0)
MCH: 28.4 pg (ref 26.0–34.0)
MCHC: 30.4 g/dL (ref 30.0–36.0)
MCV: 93.4 fL (ref 80.0–100.0)
Platelets: 200 10*3/uL (ref 150–400)
RBC: 3.8 MIL/uL — ABNORMAL LOW (ref 4.22–5.81)
RDW: 14.5 % (ref 11.5–15.5)
WBC: 8.6 10*3/uL (ref 4.0–10.5)
nRBC: 0 % (ref 0.0–0.2)

## 2019-01-04 LAB — MAGNESIUM: Magnesium: 1.8 mg/dL (ref 1.7–2.4)

## 2019-01-04 MED ORDER — AMLODIPINE BESYLATE 10 MG PO TABS
10.0000 mg | ORAL_TABLET | Freq: Every day | ORAL | Status: DC
  Administered 2019-01-04 – 2019-01-06 (×3): 10 mg via ORAL
  Filled 2019-01-04 (×3): qty 1

## 2019-01-04 MED ORDER — OXYCODONE HCL 5 MG PO TABS
10.0000 mg | ORAL_TABLET | Freq: Once | ORAL | Status: AC
  Administered 2019-01-04: 5 mg via ORAL
  Filled 2019-01-04: qty 2

## 2019-01-04 NOTE — Progress Notes (Addendum)
Subjective/Chief Complaint:  1 - Recurrent High Grade Bladder Cancer - s/p robotic cystoprostatectomy with ICG sentinal + template lymphadenctomy, extensive adhesions, and conduit diversion 10/2018 for [id number] cancer with NEGATIVE margins. BCG refractory disease prior. Urethra remains in site.   2 - Pyelonephritis, Recurrent with Left Hydronephrosis  - treated mid 11/2018 with vantin course for suspect pyelo on eval fevers and malaise. UCX enteroccus sens PCN, Cephs, Cirpo. Admit 12/2018 with sepsis again, CT with left hydro (moderate) and delayed nephrogram concerning for possile early left stricture.  UCX Pseudomonas / pending and placed on empiric Vanc + Cefipime, Cr <1.2.   Today " Billy Rasmussen " is seen improving. Less tachycardia, fever curve trending down. Minimal pain.     Objective: Vital signs in last 24 hours: Temp:  [98 F (36.7 C)-100.8 F (38.2 C)] 98 F (36.7 C) (08/05 0328) Pulse Rate:  [70-103] 70 (08/05 0600) Resp:  [20-28] 22 (08/05 0600) BP: (148-188)/(49-93) 171/49 (08/05 0600) SpO2:  [95 %-100 %] 100 % (08/05 0600)    Intake/Output from previous day: 08/04 0701 - 08/05 0700 In: 1736.8 [P.O.:480; I.V.:1061.1; IV Piggyback:195.8] Out: 3850 [Urine:3850] Intake/Output this shift: No intake/output data recorded.  General appearance: alert, cooperative and less malaise this morning.  Eyes: negative Nose: Nares normal. Septum midline. Mucosa normal. No drainage or sinus tenderness. Throat: lips, mucosa, and tongue normal; teeth and gums normal Neck: supple, symmetrical, trachea midline Back: symmetric, no curvature. ROM normal. No CVA tenderness. Resp: non-labored on minimal [place] O2 Cardio: HR 80s and regular by monitor GI: soft, non-tender; bowel sounds normal; no masses,  no organomegaly and RLQ Urostomy pink / patent of non-foul urine and scant mucus. Multiple scars w/o hernias.  Extremities: extremities normal, atraumatic, no cyanosis or edema  Lab Results:   Recent Labs    01/03/19 0223 01/04/19 0218  WBC 15.7* 8.6  HGB 10.9* 10.8*  HCT 34.3* 35.5*  PLT 239 200   BMET Recent Labs    01/03/19 0223 01/04/19 0218  NA 135 137  K 3.3* 3.8  CL 102 106  CO2 23 24  GLUCOSE 137* 116*  BUN 16 16  CREATININE 1.10 0.95  CALCIUM 8.8* 9.0   PT/INR Recent Labs    01/02/19 1323  LABPROT 15.2  INR 1.2   ABG No results for input(s): PHART, HCO3 in the last 72 hours.  Invalid input(s): PCO2, PO2  Studies/Results: Ct Abdomen Pelvis W Contrast  Result Date: 01/02/2019 CLINICAL DATA:  73 year old with fevers and sepsis. History of bladder cancer and status post radical cystoprostatectomy with ileal conduit. EXAM: CT ABDOMEN AND PELVIS WITH CONTRAST TECHNIQUE: Multidetector CT imaging of the abdomen and pelvis was performed using the standard protocol following bolus administration of intravenous contrast. CONTRAST:  139mL OMNIPAQUE IOHEXOL 300 MG/ML  SOLN COMPARISON:  CT 12/22/2018 FINDINGS: Lower chest: Lung bases are clear.  No large pleural effusions. Hepatobiliary: Numerous calcified gallstones. No gallbladder distention and no gallbladder inflammatory changes. Normal appearance of the liver without biliary dilatation. Pancreas: Unremarkable. No pancreatic ductal dilatation or surrounding inflammatory changes. Spleen: Normal in size without focal abnormality. Adrenals/Urinary Tract: Normal adrenal glands. Normal appearance of the right kidney without hydronephrosis. History of radical cystoprostatectomy with ileal conduit. Increased left perinephric edema with at least mild left hydroureteronephrosis. Left ureter is dilated to the region of the surgical anastomosis. Delayed excretion of contrast from the left renal collecting system. Stomach/Bowel: Normal appearance of the stomach and duodenum. Patient has a urinary diversion with an ileal conduit. No  gross abnormality at the ileal conduit. Expected postoperative changes in the bowel. No evidence  for focal bowel inflammation or bowel obstruction. Vascular/Lymphatic: Atherosclerotic calcifications in the aorta and iliac arteries without aneurysm. Main visceral arteries are patent. There is no significant abdominopelvic lymphadenopathy. Reproductive: Radical cystoprostatectomy. Again noted is a small amount of fluid at the surgical bed. Postoperative fluid collection measures 7.2 x 4.3 x 4.9 cm and previously measured 7.4 x 4.7 x 6.4 cm. No gas within the fluid collection. Other: Negative for free air.  Negative for ascites. Musculoskeletal: Degenerative facet disease in lower lumbar spine. Irregularity along superior endplate at L3 is stable. No acute bone abnormality. IMPRESSION: 1. Increased inflammatory changes centered around the left kidney with persistent left hydroureteronephrosis. Findings remain concerning for pyelonephritis but there is also concern for an underlying left ureter anastomotic stricture. 2. Cholelithiasis without acute inflammatory changes. 3. Postsurgical changes related to radical cystoprostatectomy. Postoperative fluid collection in the pelvis has slightly decreased in size and there is no gas within the collection. Findings are most compatible with a resolving postoperative fluid collection rather than an abscess. Electronically Signed   By: Markus Daft M.D.   On: 01/02/2019 16:46   Dg Chest Port 1 View  Result Date: 01/02/2019 CLINICAL DATA:  Fever since yesterday.  Altered mental status. EXAM: PORTABLE CHEST 1 VIEW COMPARISON:  Radiographs 12/22/2018 and 02/25/2016. FINDINGS: 1320 hours. The heart size and mediastinal contours are normal. The lungs are clear. There is no pleural effusion or pneumothorax. No acute osseous findings are identified. Patient is status post bilateral total shoulder arthroplasty. Telemetry leads overlie the chest. IMPRESSION: Stable chest without evidence of active cardiopulmonary process. Electronically Signed   By: Richardean Sale M.D.   On:  01/02/2019 13:59    Anti-infectives: Anti-infectives (From admission, onward)   Start     Dose/Rate Route Frequency Ordered Stop   01/03/19 1400  vancomycin (VANCOCIN) 1,250 mg in sodium chloride 0.9 % 250 mL IVPB     1,250 mg 166.7 mL/hr over 90 Minutes Intravenous Every 24 hours 01/02/19 1813     01/02/19 2315  metroNIDAZOLE (FLAGYL) IVPB 500 mg     500 mg 100 mL/hr over 60 Minutes Intravenous Every 8 hours 01/02/19 2302     01/02/19 2200  ceFEPIme (MAXIPIME) 2 g in sodium chloride 0.9 % 100 mL IVPB     2 g 200 mL/hr over 30 Minutes Intravenous Every 8 hours 01/02/19 1813     01/02/19 1315  ceFEPIme (MAXIPIME) 2 g in sodium chloride 0.9 % 100 mL IVPB     2 g 200 mL/hr over 30 Minutes Intravenous  Once 01/02/19 1301 01/02/19 1401   01/02/19 1315  metroNIDAZOLE (FLAGYL) IVPB 500 mg     500 mg 100 mL/hr over 60 Minutes Intravenous  Once 01/02/19 1301 01/02/19 1454   01/02/19 1315  vancomycin (VANCOCIN) IVPB 1000 mg/200 mL premix  Status:  Discontinued     1,000 mg 200 mL/hr over 60 Minutes Intravenous  Once 01/02/19 1301 01/02/19 1309   01/02/19 1315  vancomycin (VANCOCIN) 2,000 mg in sodium chloride 0.9 % 500 mL IVPB     2,000 mg 250 mL/hr over 120 Minutes Intravenous  Once 01/02/19 1309 01/02/19 1718      Assessment/Plan:  1 - Recurrent High Grade Bladder Cancer - no recurrecne by imaging this admission, observe.   2 - Pyelonephritis, Recurrent with Left Hydronephrosis  - Concerned he may have early ureteral-enteric stricture. Rec left neph tube this admission,  then antegrade ureteroscopy in elective setting after clears infectious parameters to rule out stricture and treat if present. Pt in agreement.  NPO, requested for today.   Appreciate medical team comangement.   Alexis Frock 01/04/2019   Pt received lovenox. neph tube not emergent as he is improving form infectious parameters and GFR acceptable. Will hold lovenox and agree with neph tube tomorrow.

## 2019-01-04 NOTE — Consult Note (Signed)
[place] for Infectious Disease  Total days of antibiotics 3 cefepime  Reason for Consult: psuedomonal bacteremia and pyelonephritis    Referring Physician: Cyndia Skeeters  Active Problems:   Sepsis [facility])    HPI: Billy Rasmussen is a 73 y.o. male with hx of high grade bladder cancer who is s/p robotic assist radical cystoprostatectomy with ileal conduit with extensive adhesiolysis nad lymphadenectomy on  6/10. Post operatively he started to have urinary symptoms, fevers in mid July treated with oral abtx. After second stent removal at end of July he was admitted to hospital for fever, malaise, poor intake, and imaging concerning for pyelonephritis. He was treated for 14d for e.faecalis (amp S). Hospital cx showed staph species plus e.faecalis at 50K but outpatient cx had e.faecalis 100K. He started to have recurrent symptoms on 8/3 with fever of 103F, malaise, CT abdomen and pelvis from today demonstrates increased inflammatory changes around the left kidney and persistent left-sided hydronephrosis with no other acutely concerning intra-abdominal findings. Evaluated by dr winter from urology, possibly needs L nephrostomy. Imaging suggests left ureter stricture plus fluid collection 7.2 x 4.3 x 4.9 cm and previously measured 7.4 x 4.7 x 6.4 cm - that is likely resolving post op seroma. Admit labs had wbc of 22.8K, no aki. Ua with pyuria, LE+, nitrites+ Infectious work up showing PsA in urine and blood cx. Initially started on vanco/cefepime. Now narrowed to cefepime alone. Fever curved improved  Past Medical History:  Diagnosis Date  . Arthritis    knees, shoulders, elbows  . Bladder cancer [facility])    urologist-  dr Junious Silk  . Diverticulosis of colon   . Fibromyalgia   . GERD (gastroesophageal reflux disease)   . History of blood transfusion    age 23   . History of bronchitis   . History of diverticulitis of colon   . History of gastric ulcer    due to aleve  . Hypertension   .  Incomplete left bundle branch block    told had irregular heartbeat 12-28-18-19, no referral to cardiology made  . Lower urinary tract symptoms (LUTS)   . OSA (obstructive sleep apnea)    NON-COMPLIANT  CPAP  --- BUT PT USES OXYGEN AT NIGHT 2.5L VIA [place] (PT'S DECISION)  . PONV (postoperative nausea and vomiting)   . Psoriasis    occ outbreaks on eyebrows chin and head, none recent  . Tinnitus    right ear more, has tranmitter in right ear removable at hs  . Urinary frequency   . Wears glasses   . Wears partial dentures     Allergies: No Known Allergies   MEDICATIONS: . amLODipine  10 mg Oral Daily  . Chlorhexidine Gluconate Cloth  6 each Topical Q0600  . lisinopril  20 mg Oral Daily   And  . hydrochlorothiazide  12.5 mg Oral Daily  . mouth rinse  15 mL Mouth Rinse BID  . senna  1 tablet Oral BID    Social History   Tobacco Use  . Smoking status: Former Smoker    Packs/day: 2.00    Years: 40.00    Pack years: 80.00    Types: Cigarettes    Quit date: 06/01/1997    Years since quitting: 21.6  . Smokeless tobacco: Never Used  Substance Use Topics  . Alcohol use: No  . Drug use: No    Family History  Problem Relation Age of Onset  . Alzheimer's disease Mother   . Heart attack  Father   . Heart disease Father   . Irregular heart beat Brother        DEFIB.  . Congestive Heart Failure Brother   . Diabetes Daughter     Review of Systems -  Constitutional: Negative for fever, chills, diaphoresis, activity change, appetite change, fatigue and unexpected weight change.  HENT: Negative for congestion, sore throat, rhinorrhea, sneezing, trouble swallowing and sinus pressure.  Eyes: Negative for photophobia and visual disturbance.  Respiratory: Negative for cough, chest tightness, shortness of breath, wheezing and stridor.  Cardiovascular: Negative for chest pain, palpitations and leg swelling.  Gastrointestinal: Negative for nausea, vomiting, abdominal pain, diarrhea,  constipation, blood in stool, abdominal distention and anal bleeding.  Genitourinary: Negative for dysuria, hematuria, flank pain and difficulty urinating.  Musculoskeletal: + arthritis. Negative for myalgias, back pain, joint swelling, and gait problem.  Skin: Negative for color change, pallor, rash and wound.  Neurological: Negative for dizziness, tremors, weakness and light-headedness.  Hematological: Negative for adenopathy. Does not bruise/bleed easily.  Psychiatric/Behavioral: Negative for behavioral problems, confusion, sleep disturbance, dysphoric mood, decreased concentration and agitation.      OBJECTIVE: Temp:  [97.9 F (36.6 C)-100.6 F (38.1 C)] 97.9 F (36.6 C) (08/05 1125) Pulse Rate:  [68-88] 68 (08/05 0700) Resp:  [20-25] 20 (08/05 0700) BP: (139-188)/(49-93) 139/77 (08/05 0700) SpO2:  [99 %-100 %] 100 % (08/05 0700) Physical Exam  Constitutional: He is oriented to person, place, and time. He appears well-developed and well-nourished. No distress.  HENT:  Mouth/Throat: Oropharynx is clear and moist. No oropharyngeal exudate.  Cardiovascular: Normal rate, regular rhythm and normal heart sounds. Exam reveals no gallop and no friction rub.  No murmur heard.  Pulmonary/Chest: Effort normal and breath sounds normal. No respiratory distress. He has no wheezes.  Abdominal: Soft. Bowel sounds are normal. He exhibits no distension. There is no tenderness. ilealconduit+ Lymphadenopathy:  He has no cervical adenopathy.  Neurological: He is alert and oriented to person, place, and time.  Skin: Skin is warm and dry. No rash noted. No erythema. Bilateral knee and shoulder incisions are intact Ext: no swelling or warmth of large joints Psychiatric: He has a normal mood and affect. His behavior is normal.   LABS: Results for orders placed or performed during the hospital encounter of 01/02/19 (from the past 48 hour(s))  CBC     Status: Abnormal   Collection Time: 01/02/19 11:05  PM  Result Value Ref Range   WBC 18.2 (H) 4.0 - 10.5 K/uL   RBC 3.70 (L) 4.22 - 5.81 MIL/uL   Hemoglobin 10.7 (L) 13.0 - 17.0 g/dL   HCT 34.0 (L) 39.0 - 52.0 %   MCV 91.9 80.0 - 100.0 fL   MCH 28.9 26.0 - 34.0 pg   MCHC 31.5 30.0 - 36.0 g/dL   RDW 14.6 11.5 - 15.5 %   Platelets 243 150 - 400 K/uL   nRBC 0.0 0.0 - 0.2 %    Comment: Performed at [facility], [place] [address], [place], [place] [address]  MRSA PCR Screening     Status: None   Collection Time: 01/03/19  2:00 AM   Specimen: Nasopharyngeal Wash  Result Value Ref Range   MRSA by PCR NEGATIVE NEGATIVE    Comment:        The GeneXpert MRSA Assay (FDA approved for NASAL specimens only), is one component of a comprehensive MRSA colonization surveillance program. It is not intended to diagnose MRSA infection nor to guide or monitor  treatment for MRSA infections. Performed at [facility], [place] [address] [place], [place] [address]   CBC     Status: Abnormal   Collection Time: 01/03/19  2:23 AM  Result Value Ref Range   WBC 15.7 (H) 4.0 - 10.5 K/uL   RBC 3.72 (L) 4.22 - 5.81 MIL/uL   Hemoglobin 10.9 (L) 13.0 - 17.0 g/dL   HCT 34.3 (L) 39.0 - 52.0 %   MCV 92.2 80.0 - 100.0 fL   MCH 29.3 26.0 - 34.0 pg   MCHC 31.8 30.0 - 36.0 g/dL   RDW 14.6 11.5 - 15.5 %   Platelets 239 150 - 400 K/uL   nRBC 0.0 0.0 - 0.2 %    Comment: Performed at [facility], [place] [address], [place], [place] [address]  Basic metabolic panel     Status: Abnormal   Collection Time: 01/03/19  2:23 AM  Result Value Ref Range   Sodium 135 135 - 145 mmol/L   Potassium 3.3 (L) 3.5 - 5.1 mmol/L   Chloride 102 98 - 111 mmol/L   CO2 23 22 - 32 mmol/L   Glucose, Bld 137 (H) 70 - 99 mg/dL   BUN 16 8 - 23 mg/dL   Creatinine, Ser 1.10 0.61 - 1.24 mg/dL   Calcium 8.8 (L) 8.9 - 10.3 mg/dL   GFR calc non Af Amer >60 >60 mL/min   GFR calc Af Amer >60 >60 mL/min   Anion gap 10 5 - 15    Comment:  Performed at [facility], [place] [address] [place], [place] [address]  Magnesium     Status: None   Collection Time: 01/03/19  2:23 AM  Result Value Ref Range   Magnesium 1.7 1.7 - 2.4 mg/dL    Comment: Performed at [facility], [place] [address] [place], [place] [address]  Basic metabolic panel     Status: Abnormal   Collection Time: 01/04/19  2:18 AM  Result Value Ref Range   Sodium 137 135 - 145 mmol/L   Potassium 3.8 3.5 - 5.1 mmol/L   Chloride 106 98 - 111 mmol/L   CO2 24 22 - 32 mmol/L   Glucose, Bld 116 (H) 70 - 99 mg/dL   BUN 16 8 - 23 mg/dL   Creatinine, Ser 0.95 0.61 - 1.24 mg/dL   Calcium 9.0 8.9 - 10.3 mg/dL   GFR calc non Af Amer >60 >60 mL/min   GFR calc Af Amer >60 >60 mL/min   Anion gap 7 5 - 15    Comment: Performed at [facility], [place] [address] [place], [place] [address]  CBC     Status: Abnormal   Collection Time: 01/04/19  2:18 AM  Result Value Ref Range   WBC 8.6 4.0 - 10.5 K/uL   RBC 3.80 (L) 4.22 - 5.81 MIL/uL   Hemoglobin 10.8 (L) 13.0 - 17.0 g/dL   HCT 35.5 (L) 39.0 - 52.0 %   MCV 93.4 80.0 - 100.0 fL   MCH 28.4 26.0 - 34.0 pg   MCHC 30.4 30.0 - 36.0 g/dL   RDW 14.5 11.5 - 15.5 %   Platelets 200 150 - 400 K/uL   nRBC 0.0 0.0 - 0.2 %    Comment: Performed at [facility], [place] [address], [place], [place] [address]  Magnesium     Status: None   Collection Time: 01/04/19  2:18 AM  Result Value Ref Range  Magnesium 1.8 1.7 - 2.4 mg/dL    Comment: Performed at [facility], [place] [address], [place],  [address]    MICRO: 8/5 blood cx pending 8/3 blood cx PsA 8/3 urine cx PsA(pansensitive) IMAGING: Ct Abdomen Pelvis W Contrast  Result Date: 01/02/2019 CLINICAL DATA:  73 year old with fevers and sepsis. History of bladder cancer and status post radical cystoprostatectomy with ileal conduit. EXAM: CT ABDOMEN AND PELVIS WITH CONTRAST TECHNIQUE:  Multidetector CT imaging of the abdomen and pelvis was performed using the standard protocol following bolus administration of intravenous contrast. CONTRAST:  149mL OMNIPAQUE IOHEXOL 300 MG/ML  SOLN COMPARISON:  CT 12/22/2018 FINDINGS: Lower chest: Lung bases are clear.  No large pleural effusions. Hepatobiliary: Numerous calcified gallstones. No gallbladder distention and no gallbladder inflammatory changes. Normal appearance of the liver without biliary dilatation. Pancreas: Unremarkable. No pancreatic ductal dilatation or surrounding inflammatory changes. Spleen: Normal in size without focal abnormality. Adrenals/Urinary Tract: Normal adrenal glands. Normal appearance of the right kidney without hydronephrosis. History of radical cystoprostatectomy with ileal conduit. Increased left perinephric edema with at least mild left hydroureteronephrosis. Left ureter is dilated to the region of the surgical anastomosis. Delayed excretion of contrast from the left renal collecting system. Stomach/Bowel: Normal appearance of the stomach and duodenum. Patient has a urinary diversion with an ileal conduit. No gross abnormality at the ileal conduit. Expected postoperative changes in the bowel. No evidence for focal bowel inflammation or bowel obstruction. Vascular/Lymphatic: Atherosclerotic calcifications in the aorta and iliac arteries without aneurysm. Main visceral arteries are patent. There is no significant abdominopelvic lymphadenopathy. Reproductive: Radical cystoprostatectomy. Again noted is a small amount of fluid at the surgical bed. Postoperative fluid collection measures 7.2 x 4.3 x 4.9 cm and previously measured 7.4 x 4.7 x 6.4 cm. No gas within the fluid collection. Other: Negative for free air.  Negative for ascites. Musculoskeletal: Degenerative facet disease in lower lumbar spine. Irregularity along superior endplate at L3 is stable. No acute bone abnormality. IMPRESSION: 1. Increased inflammatory changes  centered around the left kidney with persistent left hydroureteronephrosis. Findings remain concerning for pyelonephritis but there is also concern for an underlying left ureter anastomotic stricture. 2. Cholelithiasis without acute inflammatory changes. 3. Postsurgical changes related to radical cystoprostatectomy. Postoperative fluid collection in the pelvis has slightly decreased in size and there is no gas within the collection. Findings are most compatible with a resolving postoperative fluid collection rather than an abscess. Electronically Signed   By: Markus Daft M.D.   On: 01/02/2019 16:46   Assessment/Plan:  37HG M with complicated uro anatomy from high grade bladder ca and recent radical cystoprostectomy and ileal conduit in June 2020, admitted for the 2nd time for pyelonephritis however has also had secondary bacteremia.  Plan to treat with 14 d with Iv cefepime. Would use 8/5 as day 1 Can place picc line on 8/7 if current blood cx remain negative Agree that his left ureter stricture  Places him at risk for recurrence, agree with doing nephrostomy.

## 2019-01-04 NOTE — Progress Notes (Signed)
PROGRESS NOTE  JEMIAH ELLENBURG PHX:[id number] DOB: 23-Jun-1945   PCP: Terald Sleeper, PA-C  Patient is from: Home  DOA: 01/02/2019 LOS: 2  Brief Narrative / Interim history: 73 y.o. male with history of bladder cancer status post radical cystoprostatectomy on 11/09/2018 with ileal conduit and lymph node resection, hypertension and osteoarthritis returning with fever, malaise and chills for 1 day.   Recently hospitalized 7/23-7/26 for sepsis due to acute pyelonephritis.  Urine culture at that time grew Staphylococcus hemolyticus and enterococcus faecalis.  He was discharged on Zyvox to complete 10 days course but stopped 4 days early.  Reportedly urine culture at that time also grew Pseudomonas.  In ED, septic with fever, tachycardia, tachypnea and leukocytosis.  Urinalysis concerning for UTI. CT A/P revealed persistent left hydroureteronephrosis, left pyelonephritis, underlying left ureter anastomotic stricture. Urology consulted. Started on vancomycin, cefepime and Flagyl, and hospital service was called for admission.  Subjective: No major events overnight of this morning.  No complaints.  Fever curve downtrending.  Continues to endorse low back pain from lying in bed.  Denies chest pain, dyspnea or abdominal pain.  Was told to have nephrostomy tube today.  Objective: Vitals:   01/04/19 0500 01/04/19 0600 01/04/19 0700 01/04/19 0720  BP: (!) 188/92 (!) 171/49 139/77   Pulse: 73 70 68   Resp: (!) 23 (!) 22 20   Temp:    98.1 F (36.7 C)  TempSrc:    Oral  SpO2: 100% 100% 100%   Weight:      Height:        Intake/Output Summary (Last 24 hours) at 01/04/2019 1222 Last data filed at 01/04/2019 0900 Gross per 24 hour  Intake 2707.51 ml  Output 3450 ml  Net -742.49 ml   Filed Weights   01/02/19 1221 01/02/19 1455 01/03/19 0206  Weight: 96 kg 96 kg 95.4 kg    Examination:  GENERAL: No acute distress.  Appears well.  HEENT: MMM.  Vision and hearing grossly intact.  NECK: Supple.   No apparent JVD.  RESP:  No IWOB. Good air movement bilaterally. CVS:  RRR. Heart sounds normal.  ABD/GI/GU: Bowel sounds present. Soft. Non tender.  RLQ urostomy. MSK/EXT:  Moves extremities. No apparent deformity or edema.  SKIN: no apparent skin lesion or wound NEURO: Awake, alert and oriented appropriately.  No gross deficit.  PSYCH: Calm. Normal affect.   I have personally reviewed the following labs and images:  Radiology Studies: No results found.  Microbiology: Recent Results (from the past 240 hour(s))  Blood Culture (routine x 2)     Status: None (Preliminary result)   Collection Time: 01/02/19  1:23 PM   Specimen: Site Not Specified; Blood  Result Value Ref Range Status   Specimen Description   Final    SITE NOT SPECIFIED Performed at [facility] [address] [place], [place] [address]    Special Requests   Final    BOTTLES DRAWN AEROBIC AND ANAEROBIC Blood Culture adequate volume Performed at [facility] [address], [place], [place] [address]    Culture   Final    NO GROWTH < 24 HOURS Performed at [facility] [address] [place], [place] [address]    Report Status PENDING  Incomplete  Blood Culture (routine x 2)     Status: Abnormal (Preliminary result)   Collection Time: 01/02/19  1:23 PM   Specimen: Site Not Specified; Blood  Result Value Ref Range Status  Specimen Description   Final    SITE NOT SPECIFIED Performed at [facility] [address] [place], [place] [address]    Special Requests   Final    BOTTLES DRAWN AEROBIC AND ANAEROBIC Blood Culture adequate volume Performed at [facility] [address] [place], [place] [address]    Culture  Setup Time   Final    GRAM NEGATIVE RODS AEROBIC BOTTLE ONLY CRITICAL RESULT CALLED TO, READ BACK BY AND VERIFIED WITH: Seleta Rhymes PharmD 12:50 01/03/19 ([email or web address])    Culture (A)  Final    PSEUDOMONAS  AERUGINOSA SUSCEPTIBILITIES TO FOLLOW Performed at [facility], [place] [address], [place], [place] [address]    Report Status PENDING  Incomplete  Urine culture     Status: Abnormal   Collection Time: 01/02/19  1:23 PM   Specimen: In/Out Cath Urine  Result Value Ref Range Status   Specimen Description   Final    IN/OUT CATH URINE Performed at [facility] [address] [place], [place] [address]    Special Requests   Final    NONE Performed at [facility], [place] [address] [place], [place] [address]    Culture >=100,000 COLONIES/mL PSEUDOMONAS AERUGINOSA (A)  Final   Report Status 01/04/2019 FINAL  Final   Organism ID, Bacteria PSEUDOMONAS AERUGINOSA (A)  Final      Susceptibility   Pseudomonas aeruginosa - MIC*    CEFTAZIDIME 4 SENSITIVE Sensitive     CIPROFLOXACIN <=0.25 SENSITIVE Sensitive     GENTAMICIN <=1 SENSITIVE Sensitive     IMIPENEM 1 SENSITIVE Sensitive     PIP/TAZO 8 SENSITIVE Sensitive     CEFEPIME 2 SENSITIVE Sensitive     * >=100,000 COLONIES/mL PSEUDOMONAS AERUGINOSA  Blood Culture ID Panel (Reflexed)     Status: Abnormal   Collection Time: 01/02/19  1:23 PM  Result Value Ref Range Status   Enterococcus species NOT DETECTED NOT DETECTED Final   Listeria monocytogenes NOT DETECTED NOT DETECTED Final   Staphylococcus species NOT DETECTED NOT DETECTED Final   Staphylococcus aureus (BCID) NOT DETECTED NOT DETECTED Final   Streptococcus species NOT DETECTED NOT DETECTED Final   Streptococcus agalactiae NOT DETECTED NOT DETECTED Final   Streptococcus pneumoniae NOT DETECTED NOT DETECTED Final   Streptococcus pyogenes NOT DETECTED NOT DETECTED Final   Acinetobacter baumannii NOT DETECTED NOT DETECTED Final   Enterobacteriaceae species NOT DETECTED NOT DETECTED Final   Enterobacter cloacae complex NOT DETECTED NOT DETECTED Final   Escherichia coli NOT DETECTED NOT DETECTED Final   Klebsiella oxytoca NOT DETECTED NOT  DETECTED Final   Klebsiella pneumoniae NOT DETECTED NOT DETECTED Final   Proteus species NOT DETECTED NOT DETECTED Final   Serratia marcescens NOT DETECTED NOT DETECTED Final   Carbapenem resistance NOT DETECTED NOT DETECTED Final   Haemophilus influenzae NOT DETECTED NOT DETECTED Final   Neisseria meningitidis NOT DETECTED NOT DETECTED Final   Pseudomonas aeruginosa DETECTED (A) NOT DETECTED Final    Comment: CRITICAL RESULT CALLED TO, READ BACK BY AND VERIFIED WITH: Seleta Rhymes PharmD 12:50 01/03/19 ([email or web address])    Candida albicans NOT DETECTED NOT DETECTED Final   Candida glabrata NOT DETECTED NOT DETECTED Final   Candida krusei NOT DETECTED NOT DETECTED Final   Candida parapsilosis NOT DETECTED NOT DETECTED Final   Candida tropicalis NOT DETECTED NOT DETECTED Final    Comment: Performed at [facility], [address], [place], [place] [address]  SARS  Coronavirus 2 [facility] order, Performed in [facility] hospital lab) Nasopharyngeal Nasopharyngeal Swab     Status: None   Collection Time: 01/02/19  1:47 PM   Specimen: Nasopharyngeal Swab  Result Value Ref Range Status   SARS Coronavirus 2 NEGATIVE NEGATIVE Final    Comment: (NOTE) If result is NEGATIVE SARS-CoV-2 target nucleic acids are NOT DETECTED. The SARS-CoV-2 RNA is generally detectable in upper and lower  respiratory specimens during the acute phase of infection. The lowest  concentration of SARS-CoV-2 viral copies this assay can detect is 250  copies / mL. A negative result does not preclude SARS-CoV-2 infection  and should not be used as the sole basis for treatment or other  patient management decisions.  A negative result may occur with  improper specimen collection / handling, submission of specimen other  than nasopharyngeal swab, presence of viral mutation(s) within the  areas targeted by this assay, and inadequate number of viral copies  (<250 copies / mL). A negative result must be combined with clinical   observations, patient history, and epidemiological information. If result is POSITIVE SARS-CoV-2 target nucleic acids are DETECTED. The SARS-CoV-2 RNA is generally detectable in upper and lower  respiratory specimens dur ing the acute phase of infection.  Positive  results are indicative of active infection with SARS-CoV-2.  Clinical  correlation with patient history and other diagnostic information is  necessary to determine patient infection status.  Positive results do  not rule out bacterial infection or co-infection with other viruses. If result is PRESUMPTIVE POSTIVE SARS-CoV-2 nucleic acids MAY BE PRESENT.   A presumptive positive result was obtained on the submitted specimen  and confirmed on repeat testing.  While 2019 novel coronavirus  (SARS-CoV-2) nucleic acids may be present in the submitted sample  additional confirmatory testing may be necessary for epidemiological  and / or clinical management purposes  to differentiate between  SARS-CoV-2 and other Sarbecovirus currently known to infect humans.  If clinically indicated additional testing with an alternate test  methodology [phone]) is advised. The SARS-CoV-2 RNA is generally  detectable in upper and lower respiratory sp ecimens during the acute  phase of infection. The expected result is Negative. Fact Sheet for Patients:  StrictlyIdeas.no Fact Sheet for Healthcare Providers: BankingDealers.co.za This test is not yet approved or cleared by the [place] FDA and has been authorized for detection and/or diagnosis of SARS-CoV-2 by FDA under an Emergency Use Authorization (EUA).  This EUA will remain in effect (meaning this test can be used) for the duration of the COVID-19 declaration under Section 564(b)(1) of the Act, 21 U.S.C. section 360bbb-3(b)(1), unless the authorization is terminated or revoked sooner. Performed at [facility], [place]  [address], [place], [place] [address]   MRSA PCR Screening     Status: None   Collection Time: 01/03/19  2:00 AM   Specimen: Nasopharyngeal Wash  Result Value Ref Range Status   MRSA by PCR NEGATIVE NEGATIVE Final    Comment:        The GeneXpert MRSA Assay (FDA approved for NASAL specimens only), is one component of a comprehensive MRSA colonization surveillance program. It is not intended to diagnose MRSA infection nor to guide or monitor treatment for MRSA infections. Performed at [facility], [place] [address], [place], [place] [address]     Sepsis Labs: Invalid input(s): PROCALCITONIN, LACTICIDVEN  Urine analysis:    Component Value Date/Time   COLORURINE YELLOW 01/02/2019 1323   APPEARANCEUR HAZY (A)  01/02/2019 1323   APPEARANCEUR Cloudy (A) 01/27/2018 1625   LABSPEC 1.012 01/02/2019 1323   PHURINE 7.0 01/02/2019 1323   GLUCOSEU NEGATIVE 01/02/2019 1323   HGBUR SMALL (A) 01/02/2019 1323   BILIRUBINUR NEGATIVE 01/02/2019 1323   BILIRUBINUR Negative 01/27/2018 1625   KETONESUR NEGATIVE 01/02/2019 1323   PROTEINUR 30 (A) 01/02/2019 1323   NITRITE POSITIVE (A) 01/02/2019 1323   LEUKOCYTESUR LARGE (A) 01/02/2019 1323    Anemia Panel: No results for input(s): VITAMINB12, FOLATE, FERRITIN, TIBC, IRON, RETICCTPCT in the last 72 hours.  Thyroid Function Tests: No results for input(s): TSH, T4TOTAL, FREET4, T3FREE, THYROIDAB in the last 72 hours.  Lipid Profile: No results for input(s): CHOL, HDL, LDLCALC, TRIG, CHOLHDL, LDLDIRECT in the last 72 hours.  CBG: No results for input(s): GLUCAP in the last 168 hours.  HbA1C: No results for input(s): HGBA1C in the last 72 hours.  BNP (last 3 results): No results for input(s): PROBNP in the last 8760 hours.  Cardiac Enzymes: No results for input(s): CKTOTAL, CKMB, CKMBINDEX, TROPONINI in the last 168 hours.  Coagulation Profile: Recent Labs  Lab 01/02/19 1323  INR 1.2    Liver Function Tests:  Recent Labs  Lab 12/30/18 1133 01/02/19 1323  AST 36 34  ALT 21 22  ALKPHOS 76 72  BILITOT 0.4 1.1  PROT 7.2 8.4*  ALBUMIN 4.3 4.1   Recent Labs  Lab 01/02/19 1323  LIPASE 27   No results for input(s): AMMONIA in the last 168 hours.  Basic Metabolic Panel: Recent Labs  Lab 12/30/18 1133 01/02/19 1323 01/03/19 0223 01/04/19 0218  NA 140 136 135 137  K 5.0 3.8 3.3* 3.8  CL 99 98 102 106  CO2 26 25 23 24   GLUCOSE 95 149* 137* 116*  BUN 19 17 16 16   CREATININE 1.07 1.07 1.10 0.95  CALCIUM 9.8 9.8 8.8* 9.0  MG  --   --  1.7 1.8   GFR: Estimated Creatinine Clearance: 77.1 mL/min (by C-G formula based on SCr of 0.95 mg/dL).  CBC: Recent Labs  Lab 12/30/18 1133 01/02/19 1323 01/02/19 2305 01/03/19 0223 01/04/19 0218  WBC 7.7 22.8* 18.2* 15.7* 8.6  NEUTROABS 5.9 21.1*  --   --   --   HGB 11.7* 13.1 10.7* 10.9* 10.8*  HCT 36.7* 41.8 34.0* 34.3* 35.5*  MCV 91 91.5 91.9 92.2 93.4  PLT 332 336 243 239 200    Procedures:  None  Microbiology summarized: [id number] negative. Blood culture GNR on Gram stain. Urine culture GNR greater than 1000 colonies.  Assessment & Plan: Sepsis due to Pseudomonas bacteremia/pyelonephritis: POA -Sepsis physiology resolving.  Fever curve downtrending.  Leukocytosis resolved. -Has recurrent bladder ca s/p radical cystoprostatectomy and ileal conduit on 11/09/2018 -CT A/P persistent left hydroureteronephrosis and left pyelonephritis -Ileal conduit patent on CT abdomen and pelvis. -Blood and urine culture grew Pseudomonas. -Vancomycin and Flagyl 8/3-8/5 -Continue cefepime 8/3>> -Repeat blood culture -Appreciate urology input.  Plan for left nephrostomy tube on 8/6 -ID consulted for guidance on Pseudomonas bacteremia  Acute metabolic encephalopathy: Likely due to the above.  Resolved. -Fall and aspiration precaution -Treat sepsis as above.  Hypertension: BP remains elevated. -Continue home lisinopril/HCTZ. -Add amlodipine  10 mg daily  -PRN labetalol -Discontinue IV fluid.  Fever and leukocytosis with bandemia: Resolved. -Likely due to #1  Tachypnea and tachycardia: CXR without acute finding.  EKG features junctional tachycardia.  Improved. -Likely due to #1.   Hypokalemia: Resolved.  Magnesium was normal range.  Normocytic anemia:  Likely hemo-dilutional.  May be hemoconcentrated on arrival.  H&H stable. -Continue monitoring  DVT prophylaxis: Subcu Lovenox Code Status: Full code Family Communication: Patient and/or RN. Available if any question. Disposition Plan: Remains inpatient.  Transfer to telemetry bed. Consultants: Nephrology, ID   Antimicrobials: Anti-infectives (From admission, onward)   Start     Dose/Rate Route Frequency Ordered Stop   01/03/19 1400  vancomycin (VANCOCIN) 1,250 mg in sodium chloride 0.9 % 250 mL IVPB  Status:  Discontinued     1,250 mg 166.7 mL/hr over 90 Minutes Intravenous Every 24 hours 01/02/19 1813 01/04/19 1130   01/02/19 2315  metroNIDAZOLE (FLAGYL) IVPB 500 mg  Status:  Discontinued     500 mg 100 mL/hr over 60 Minutes Intravenous Every 8 hours 01/02/19 2302 01/04/19 1130   01/02/19 2200  ceFEPIme (MAXIPIME) 2 g in sodium chloride 0.9 % 100 mL IVPB     2 g 200 mL/hr over 30 Minutes Intravenous Every 8 hours 01/02/19 1813     01/02/19 1315  ceFEPIme (MAXIPIME) 2 g in sodium chloride 0.9 % 100 mL IVPB     2 g 200 mL/hr over 30 Minutes Intravenous  Once 01/02/19 1301 01/02/19 1401   01/02/19 1315  metroNIDAZOLE (FLAGYL) IVPB 500 mg     500 mg 100 mL/hr over 60 Minutes Intravenous  Once 01/02/19 1301 01/02/19 1454   01/02/19 1315  vancomycin (VANCOCIN) IVPB 1000 mg/200 mL premix  Status:  Discontinued     1,000 mg 200 mL/hr over 60 Minutes Intravenous  Once 01/02/19 1301 01/02/19 1309   01/02/19 1315  vancomycin (VANCOCIN) 2,000 mg in sodium chloride 0.9 % 500 mL IVPB     2,000 mg 250 mL/hr over 120 Minutes Intravenous  Once 01/02/19 1309 01/02/19 1718       Sch Meds:  Scheduled Meds: . amLODipine  10 mg Oral Daily  . Chlorhexidine Gluconate Cloth  6 each Topical Q0600  . lisinopril  20 mg Oral Daily   And  . hydrochlorothiazide  12.5 mg Oral Daily  . mouth rinse  15 mL Mouth Rinse BID  . senna  1 tablet Oral BID   Continuous Infusions: . ceFEPime (MAXIPIME) IV Stopped (01/03/19 2239)   PRN Meds:.acetaminophen **OR** acetaminophen, albuterol, labetalol, ondansetron **OR** ondansetron (ZOFRAN) IV, oxyCODONE, polyethylene glycol, traZODone   Taye T. [place]  If 7PM-7AM, please contact night-coverage [email or web address] Password [email or web address] 01/04/2019, 12:22 PM

## 2019-01-04 NOTE — Progress Notes (Signed)
Patient transferred from ICU. Vital signs stable. Patient has no complaints. Telemetry applied and patient oriented to the room and equipment. No changes from previous assessment. Will continue to monitor.   Taite Schoeppner, Fraser Din 01/04/2019 6:48 PM
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
## 2019-01-05 ENCOUNTER — Inpatient Hospital Stay (HOSPITAL_COMMUNITY): Payer: Medicare Other

## 2019-01-05 ENCOUNTER — Other Ambulatory Visit (HOSPITAL_COMMUNITY): Payer: PRIVATE HEALTH INSURANCE

## 2019-01-05 ENCOUNTER — Encounter (HOSPITAL_COMMUNITY): Payer: Self-pay | Admitting: Interventional Radiology

## 2019-01-05 DIAGNOSIS — G9341 Metabolic encephalopathy: Secondary | ICD-10-CM

## 2019-01-05 DIAGNOSIS — R7881 Bacteremia: Secondary | ICD-10-CM

## 2019-01-05 DIAGNOSIS — A4152 Sepsis due to Pseudomonas: Principal | ICD-10-CM

## 2019-01-05 HISTORY — PX: IR NEPHROSTOMY PLACEMENT LEFT: IMG6063

## 2019-01-05 LAB — CULTURE, BLOOD (ROUTINE X 2): Special Requests: ADEQUATE

## 2019-01-05 LAB — CBC
HCT: 33.9 % — ABNORMAL LOW (ref 39.0–52.0)
Hemoglobin: 10.8 g/dL — ABNORMAL LOW (ref 13.0–17.0)
MCH: 28.8 pg (ref 26.0–34.0)
MCHC: 31.9 g/dL (ref 30.0–36.0)
MCV: 90.4 fL (ref 80.0–100.0)
Platelets: 231 10*3/uL (ref 150–400)
RBC: 3.75 MIL/uL — ABNORMAL LOW (ref 4.22–5.81)
RDW: 14.2 % (ref 11.5–15.5)
WBC: 6.2 10*3/uL (ref 4.0–10.5)
nRBC: 0 % (ref 0.0–0.2)

## 2019-01-05 LAB — BASIC METABOLIC PANEL
Anion gap: 8 (ref 5–15)
BUN: 13 mg/dL (ref 8–23)
CO2: 26 mmol/L (ref 22–32)
Calcium: 9.3 mg/dL (ref 8.9–10.3)
Chloride: 100 mmol/L (ref 98–111)
Creatinine, Ser: 0.85 mg/dL (ref 0.61–1.24)
GFR calc Af Amer: >60 mL/min (ref >60)
GFR calc non Af Amer: >60 mL/min (ref >60)
Glucose, Bld: 98 mg/dL (ref 70–99)
Potassium: 3.7 mmol/L (ref 3.5–5.1)
Sodium: 134 mmol/L — ABNORMAL LOW (ref 135–145)

## 2019-01-05 LAB — MAGNESIUM: Magnesium: 1.7 mg/dL (ref 1.7–2.4)

## 2019-01-05 MED ORDER — FENTANYL CITRATE (PF) 100 MCG/2ML IJ SOLN
INTRAMUSCULAR | Status: AC | PRN
  Administered 2019-01-05 (×2): 50 ug via INTRAVENOUS

## 2019-01-05 MED ORDER — IOHEXOL 300 MG/ML  SOLN
50.0000 mL | Freq: Once | INTRAMUSCULAR | Status: AC | PRN
  Administered 2019-01-05: 20 mL

## 2019-01-05 MED ORDER — MIDAZOLAM HCL 2 MG/2ML IJ SOLN
INTRAMUSCULAR | Status: AC
  Filled 2019-01-05: qty 4

## 2019-01-05 MED ORDER — FENTANYL CITRATE (PF) 100 MCG/2ML IJ SOLN
INTRAMUSCULAR | Status: AC
  Filled 2019-01-05: qty 4

## 2019-01-05 MED ORDER — MORPHINE SULFATE (PF) 2 MG/ML IV SOLN
2.0000 mg | INTRAVENOUS | Status: AC | PRN
  Administered 2019-01-05 (×3): 2 mg via INTRAVENOUS
  Filled 2019-01-05 (×3): qty 1

## 2019-01-05 MED ORDER — LISINOPRIL 20 MG PO TABS
20.0000 mg | ORAL_TABLET | Freq: Every day | ORAL | Status: DC
  Administered 2019-01-06: 20 mg via ORAL
  Filled 2019-01-05: qty 1

## 2019-01-05 MED ORDER — LIDOCAINE HCL (PF) 1 % IJ SOLN
INTRAMUSCULAR | Status: AC | PRN
  Administered 2019-01-05: 10 mL

## 2019-01-05 MED ORDER — SODIUM CHLORIDE 0.9% FLUSH
5.0000 mL | Freq: Three times a day (TID) | INTRAVENOUS | Status: DC
  Administered 2019-01-06: 5 mL

## 2019-01-05 MED ORDER — MORPHINE SULFATE (PF) 4 MG/ML IV SOLN: 3.0000 mg | INTRAVENOUS | Status: DC | PRN

## 2019-01-05 MED ORDER — HYDROCHLOROTHIAZIDE 25 MG PO TABS
25.0000 mg | ORAL_TABLET | Freq: Every day | ORAL | Status: DC
  Administered 2019-01-06: 25 mg via ORAL
  Filled 2019-01-05: qty 1

## 2019-01-05 MED ORDER — ENOXAPARIN SODIUM 40 MG/0.4ML ~~LOC~~ SOLN
40.0000 mg | SUBCUTANEOUS | Status: DC
  Administered 2019-01-06: 40 mg via SUBCUTANEOUS
  Filled 2019-01-05: qty 0.4

## 2019-01-05 MED ORDER — LIDOCAINE HCL 1 % IJ SOLN
INTRAMUSCULAR | Status: AC
  Filled 2019-01-05: qty 20

## 2019-01-05 MED ORDER — MIDAZOLAM HCL 2 MG/2ML IJ SOLN
INTRAMUSCULAR | Status: AC | PRN
  Administered 2019-01-05 (×4): 1 mg via INTRAVENOUS

## 2019-01-05 NOTE — Progress Notes (Signed)
MEDICATION-RELATED CONSULT NOTE   IR Procedure Consult - Anticoagulant/Antiplatelet PTA/Inpatient Med List Review by Pharmacist    Procedure: Successful [place] and fluoroscopic guided placement of a left sided PCN with end coiled and locked within the renal pelvis.    Completed: 01/05/19  Post-Procedural bleeding risk per IR MD assessment:  high  Antithrombotic medications on inpatient or PTA profile prior to procedure:    Enoxaparin 40mg  SQ q24h   Recommended restart time per IR Post-Procedure Guidelines:   Day +1 (next am)  Other considerations:      Plan:      Enoxaparin 40mg  SQ q24h starting 01/06/19  Dolly Rias RPh 01/05/2019, 5:53 PM Pager [phone]

## 2019-01-05 NOTE — Progress Notes (Signed)
PROGRESS NOTE  Billy Rasmussen IRS:[id number] DOB: [birth year]   PCP: Terald Sleeper, PA-C  Patient is from: Home  DOA: 01/02/2019 LOS: 3  Brief Narrative / Interim history: 73 y.o. male with history of bladder cancer status post radical cystoprostatectomy on 11/09/2018 with ileal conduit and lymph node resection, hypertension and osteoarthritis returning with fever, malaise and chills for 1 day.   Recently hospitalized 7/23-7/26 for sepsis due to acute pyelonephritis.  Urine culture at that time grew Staphylococcus hemolyticus and enterococcus faecalis.  He was discharged on Zyvox to complete 10 days course but stopped 4 days early.  Reportedly urine culture at that time also grew Pseudomonas.  In ED, septic with fever, tachycardia, tachypnea and leukocytosis.  Urinalysis concerning for UTI. CT A/P revealed persistent left hydroureteronephrosis, left pyelonephritis, underlying left ureter anastomotic stricture. Urology consulted. Started on vancomycin, cefepime and Flagyl, and hospital service was called for admission.  Blood and urine culture grew Pseudomonas aeruginosa.  Antibiotic de-escalated to cefepime. ID consulted and recommended IV cefepime for 14 days starting 01/04/2019.  Subjective: No major events overnight of this morning.  Complains pain from his arthritis in his neck and arms.  Afraid of asking pain medications in case it interferes with his nephrostomy procedure.  No other complaints.  He denies headache, chest pain or GI symptoms.  He feels his breathing is slightly labored from lying in bed continuously.  Denies cough.  Objective: Vitals:   01/04/19 1600 01/04/19 1826 01/04/19 2149 01/05/19 0517  BP: (!) 160/71 (!) 156/80 (!) 153/86 (!) 145/74  Pulse: 79 81 77 73  Resp: (!) 22 (!) 24 18 18   Temp:  99.1 F (37.3 C) 98.3 F (36.8 C) 98.1 F (36.7 C)  TempSrc:  Oral Oral Oral  SpO2: 98% 97% 97% 100%  Weight:      Height:        Intake/Output Summary (Last 24 hours) at  01/05/2019 1247 Last data filed at 01/05/2019 1224 Gross per 24 hour  Intake 302.83 ml  Output 5400 ml  Net -5097.17 ml   Filed Weights   01/02/19 1221 01/02/19 1455 01/03/19 0206  Weight: 96 kg 96 kg 95.4 kg    Examination:  GENERAL: No acute distress.  Appears well.  HEENT: MMM.  Vision and hearing grossly intact.  NECK: Supple.  No apparent JVD.  RESP:  No IWOB. Good air movement bilaterally. CVS:  RRR. Heart sounds normal.  ABD/GI/GU: Bowel sounds present. Soft. Non tender.  RLQ urostomy. MSK/EXT:  Moves extremities. No apparent deformity or edema.  SKIN: no apparent skin lesion or wound NEURO: Awake, alert and oriented appropriately.  No gross deficit.  PSYCH: Calm. Normal affect.   I have personally reviewed the following labs and images:  Radiology Studies: No results found.  Microbiology: Recent Results (from the past 240 hour(s))  Blood Culture (routine x 2)     Status: None (Preliminary result)   Collection Time: 01/02/19  1:23 PM   Specimen: Site Not Specified; Blood  Result Value Ref Range Status   Specimen Description   Final    SITE NOT SPECIFIED Performed at [facility] [address], [place], [place] [address]    Special Requests   Final    BOTTLES DRAWN AEROBIC AND ANAEROBIC Blood Culture adequate volume Performed at [facility] [address], [place], [place] [address]    Culture   Final    NO GROWTH 2 DAYS Performed at [facility],  [address], [place], [place] [address]    Report Status PENDING  Incomplete  Blood Culture (routine x 2)     Status: Abnormal   Collection Time: 01/02/19  1:23 PM   Specimen: Site Not Specified; Blood  Result Value Ref Range Status   Specimen Description   Final    SITE NOT SPECIFIED Performed at [facility] [address], [place], [place] [address]    Special Requests   Final    BOTTLES DRAWN AEROBIC AND ANAEROBIC Blood Culture adequate  volume Performed at [facility] [address], [place], [place] [address]    Culture  Setup Time   Final    GRAM NEGATIVE RODS AEROBIC BOTTLE ONLY CRITICAL RESULT CALLED TO, READ BACK BY AND VERIFIED WITH: Seleta Rhymes PharmD 12:50 01/03/19 ([email or web address]) Performed at [facility], [address] [place], [place] [address]    Culture PSEUDOMONAS AERUGINOSA (A)  Final   Report Status 01/05/2019 FINAL  Final   Organism ID, Bacteria PSEUDOMONAS AERUGINOSA  Final      Susceptibility   Pseudomonas aeruginosa - MIC*    CEFTAZIDIME 4 SENSITIVE Sensitive     CIPROFLOXACIN <=0.25 SENSITIVE Sensitive     GENTAMICIN <=1 SENSITIVE Sensitive     IMIPENEM 2 SENSITIVE Sensitive     PIP/TAZO 16 SENSITIVE Sensitive     CEFEPIME 4 SENSITIVE Sensitive     * PSEUDOMONAS AERUGINOSA  Urine culture     Status: Abnormal   Collection Time: 01/02/19  1:23 PM   Specimen: In/Out Cath Urine  Result Value Ref Range Status   Specimen Description   Final    IN/OUT CATH URINE Performed at [facility], [place] [address] [place], [place] [address]    Special Requests   Final    NONE Performed at [facility], [place] [address], [place], [place] [address]    Culture >=100,000 COLONIES/mL PSEUDOMONAS AERUGINOSA (A)  Final   Report Status 01/04/2019 FINAL  Final   Organism ID, Bacteria PSEUDOMONAS AERUGINOSA (A)  Final      Susceptibility   Pseudomonas aeruginosa - MIC*    CEFTAZIDIME 4 SENSITIVE Sensitive     CIPROFLOXACIN <=0.25 SENSITIVE Sensitive     GENTAMICIN <=1 SENSITIVE Sensitive     IMIPENEM 1 SENSITIVE Sensitive     PIP/TAZO 8 SENSITIVE Sensitive     CEFEPIME 2 SENSITIVE Sensitive     * >=100,000 COLONIES/mL PSEUDOMONAS AERUGINOSA  Blood Culture ID Panel (Reflexed)     Status: Abnormal   Collection Time: 01/02/19  1:23 PM  Result Value Ref Range Status   Enterococcus species NOT DETECTED NOT DETECTED Final   Listeria monocytogenes NOT  DETECTED NOT DETECTED Final   Staphylococcus species NOT DETECTED NOT DETECTED Final   Staphylococcus aureus (BCID) NOT DETECTED NOT DETECTED Final   Streptococcus species NOT DETECTED NOT DETECTED Final   Streptococcus agalactiae NOT DETECTED NOT DETECTED Final   Streptococcus pneumoniae NOT DETECTED NOT DETECTED Final   Streptococcus pyogenes NOT DETECTED NOT DETECTED Final   Acinetobacter baumannii NOT DETECTED NOT DETECTED Final   Enterobacteriaceae species NOT DETECTED NOT DETECTED Final   Enterobacter cloacae complex NOT DETECTED NOT DETECTED Final   Escherichia coli NOT DETECTED NOT DETECTED Final   Klebsiella oxytoca NOT DETECTED NOT DETECTED Final   Klebsiella pneumoniae NOT DETECTED NOT DETECTED Final   Proteus species NOT DETECTED NOT DETECTED Final   Serratia marcescens NOT DETECTED NOT DETECTED Final  Carbapenem resistance NOT DETECTED NOT DETECTED Final   Haemophilus influenzae NOT DETECTED NOT DETECTED Final   Neisseria meningitidis NOT DETECTED NOT DETECTED Final   Pseudomonas aeruginosa DETECTED (A) NOT DETECTED Final    Comment: CRITICAL RESULT CALLED TO, READ BACK BY AND VERIFIED WITH: Seleta Rhymes PharmD 12:50 01/03/19 ([email or web address])    Candida albicans NOT DETECTED NOT DETECTED Final   Candida glabrata NOT DETECTED NOT DETECTED Final   Candida krusei NOT DETECTED NOT DETECTED Final   Candida parapsilosis NOT DETECTED NOT DETECTED Final   Candida tropicalis NOT DETECTED NOT DETECTED Final    Comment: Performed at [facility], [place] [address], [place], [place] [address]  SARS Coronavirus 2 [facility] order, Performed in [facility] hospital lab) Nasopharyngeal Nasopharyngeal Swab     Status: None   Collection Time: 01/02/19  1:47 PM   Specimen: Nasopharyngeal Swab  Result Value Ref Range Status   SARS Coronavirus 2 NEGATIVE NEGATIVE Final    Comment: (NOTE) If result is NEGATIVE SARS-CoV-2 target nucleic acids are NOT DETECTED. The SARS-CoV-2 RNA is generally  detectable in upper and lower  respiratory specimens during the acute phase of infection. The lowest  concentration of SARS-CoV-2 viral copies this assay can detect is 250  copies / mL. A negative result does not preclude SARS-CoV-2 infection  and should not be used as the sole basis for treatment or other  patient management decisions.  A negative result may occur with  improper specimen collection / handling, submission of specimen other  than nasopharyngeal swab, presence of viral mutation(s) within the  areas targeted by this assay, and inadequate number of viral copies  (<250 copies / mL). A negative result must be combined with clinical  observations, patient history, and epidemiological information. If result is POSITIVE SARS-CoV-2 target nucleic acids are DETECTED. The SARS-CoV-2 RNA is generally detectable in upper and lower  respiratory specimens dur ing the acute phase of infection.  Positive  results are indicative of active infection with SARS-CoV-2.  Clinical  correlation with patient history and other diagnostic information is  necessary to determine patient infection status.  Positive results do  not rule out bacterial infection or co-infection with other viruses. If result is PRESUMPTIVE POSTIVE SARS-CoV-2 nucleic acids MAY BE PRESENT.   A presumptive positive result was obtained on the submitted specimen  and confirmed on repeat testing.  While 2019 novel coronavirus  (SARS-CoV-2) nucleic acids may be present in the submitted sample  additional confirmatory testing may be necessary for epidemiological  and / or clinical management purposes  to differentiate between  SARS-CoV-2 and other Sarbecovirus currently known to infect humans.  If clinically indicated additional testing with an alternate test  methodology [phone]) is advised. The SARS-CoV-2 RNA is generally  detectable in upper and lower respiratory sp ecimens during the acute  phase of infection. The  expected result is Negative. Fact Sheet for Patients:  StrictlyIdeas.no Fact Sheet for Healthcare Providers: BankingDealers.co.za This test is not yet approved or cleared by the [place] FDA and has been authorized for detection and/or diagnosis of SARS-CoV-2 by FDA under an Emergency Use Authorization (EUA).  This EUA will remain in effect (meaning this test can be used) for the duration of the COVID-19 declaration under Section 564(b)(1) of the Act, 21 U.S.C. section 360bbb-3(b)(1), unless the authorization is terminated or revoked sooner. Performed at [facility], [place] [address], [place], [place] [address]   MRSA PCR Screening     Status:  None   Collection Time: 01/03/19  2:00 AM   Specimen: Nasopharyngeal Wash  Result Value Ref Range Status   MRSA by PCR NEGATIVE NEGATIVE Final    Comment:        The GeneXpert MRSA Assay (FDA approved for NASAL specimens only), is one component of a comprehensive MRSA colonization surveillance program. It is not intended to diagnose MRSA infection nor to guide or monitor treatment for MRSA infections. Performed at [facility], [place] [address] [place], [place] [address]     Sepsis Labs: Invalid input(s): PROCALCITONIN, LACTICIDVEN  Urine analysis:    Component Value Date/Time   COLORURINE YELLOW 01/02/2019 1323   APPEARANCEUR HAZY (A) 01/02/2019 1323   APPEARANCEUR Cloudy (A) 01/27/2018 1625   LABSPEC 1.012 01/02/2019 1323   PHURINE 7.0 01/02/2019 1323   GLUCOSEU NEGATIVE 01/02/2019 1323   HGBUR SMALL (A) 01/02/2019 1323   BILIRUBINUR NEGATIVE 01/02/2019 1323   BILIRUBINUR Negative 01/27/2018 1625   KETONESUR NEGATIVE 01/02/2019 1323   PROTEINUR 30 (A) 01/02/2019 1323   NITRITE POSITIVE (A) 01/02/2019 1323   LEUKOCYTESUR LARGE (A) 01/02/2019 1323    Anemia Panel: No results for input(s): VITAMINB12, FOLATE, FERRITIN, TIBC, IRON,  RETICCTPCT in the last 72 hours.  Thyroid Function Tests: No results for input(s): TSH, T4TOTAL, FREET4, T3FREE, THYROIDAB in the last 72 hours.  Lipid Profile: No results for input(s): CHOL, HDL, LDLCALC, TRIG, CHOLHDL, LDLDIRECT in the last 72 hours.  CBG: No results for input(s): GLUCAP in the last 168 hours.  HbA1C: No results for input(s): HGBA1C in the last 72 hours.  BNP (last 3 results): No results for input(s): PROBNP in the last 8760 hours.  Cardiac Enzymes: No results for input(s): CKTOTAL, CKMB, CKMBINDEX, TROPONINI in the last 168 hours.  Coagulation Profile: Recent Labs  Lab 01/02/19 1323  INR 1.2    Liver Function Tests: Recent Labs  Lab 12/30/18 1133 01/02/19 1323  AST 36 34  ALT 21 22  ALKPHOS 76 72  BILITOT 0.4 1.1  PROT 7.2 8.4*  ALBUMIN 4.3 4.1   Recent Labs  Lab 01/02/19 1323  LIPASE 27   No results for input(s): AMMONIA in the last 168 hours.  Basic Metabolic Panel: Recent Labs  Lab 12/30/18 1133 01/02/19 1323 01/03/19 0223 01/04/19 0218 01/05/19 0456  NA 140 136 135 137 134*  K 5.0 3.8 3.3* 3.8 3.7  CL 99 98 102 106 100  CO2 26 25 23 24 26   GLUCOSE 95 149* 137* 116* 98  BUN 19 17 16 16 13   CREATININE 1.07 1.07 1.10 0.95 0.85  CALCIUM 9.8 9.8 8.8* 9.0 9.3  MG  --   --  1.7 1.8 1.7   GFR: Estimated Creatinine Clearance: 86.1 mL/min (by C-G formula based on SCr of 0.85 mg/dL).  CBC: Recent Labs  Lab 12/30/18 1133 01/02/19 1323 01/02/19 2305 01/03/19 0223 01/04/19 0218 01/05/19 0456  WBC 7.7 22.8* 18.2* 15.7* 8.6 6.2  NEUTROABS 5.9 21.1*  --   --   --   --   HGB 11.7* 13.1 10.7* 10.9* 10.8* 10.8*  HCT 36.7* 41.8 34.0* 34.3* 35.5* 33.9*  MCV 91 91.5 91.9 92.2 93.4 90.4  PLT 332 336 243 239 200 231    Procedures:  None  Microbiology summarized: COVID-19 negative. Blood culture GNR on Gram stain. Urine culture GNR greater than 1000 colonies.  Assessment & Plan: Sepsis due to Pseudomonas  bacteremia/pyelonephritis: POA -Sepsis physiology resolved.  Fever curve downtrending.  Last fever on 8/4. -  Has recurrent bladder ca s/p radical cystoprostatectomy and ileal conduit on 11/09/2018 -CT A/P persistent left hydroureteronephrosis and left pyelonephritis -Ileal conduit patent on CT abdomen and pelvis. -Blood and urine culture grew Pseudomonas. -Vancomycin and Flagyl 8/3-8/5 -Continue cefepime 8/3>>.  Plan is for 14 days starting 01/04/2019 per ID. -Follow repeat blood culture from 01/04/2019. -Appreciate urology input.  Plan for left nephrostomy tube by IR on 8/6 -Appreciate ID input -PICC line on 8/7 if blood culture on 8/5 negative  Acute metabolic encephalopathy: Likely due to the above.  Resolved. -Treat sepsis as above. -PT/OT/OOB  Hypertension: BP slightly elevated but improved. -Continue home lisinopril/HCTZ. -Added amlodipine 10 mg daily 8/5 -PRN labetalol  Fever and leukocytosis with bandemia: Resolved. -Likely due to #1  Tachypnea and tachycardia: CXR without acute finding.  EKG features junctional tachycardia.  Resolved. -Likely due to #1.   Hypokalemia: Resolved.  Magnesium was normal range.  Normocytic anemia: Likely hemo-dilutional.  May be hemoconcentrated on arrival.  H&H stable. -Continue monitoring  Osteoarthritis: -PRN IV morphine while awaiting nephrostomy procedure. -PT/OT/OOB  DVT prophylaxis: Subcu Lovenox Code Status: Full code Family Communication: Patient and/or RN. Available if any question. Disposition Plan: Remains inpatient.  Consultants: Urology, ID, IR   Antimicrobials: Anti-infectives (From admission, onward)   Start     Dose/Rate Route Frequency Ordered Stop   01/03/19 1400  vancomycin (VANCOCIN) 1,250 mg in sodium chloride 0.9 % 250 mL IVPB  Status:  Discontinued     1,250 mg 166.7 mL/hr over 90 Minutes Intravenous Every 24 hours 01/02/19 1813 01/04/19 1130   01/02/19 2315  metroNIDAZOLE (FLAGYL) IVPB 500 mg  Status:   Discontinued     500 mg 100 mL/hr over 60 Minutes Intravenous Every 8 hours 01/02/19 2302 01/04/19 1130   01/02/19 2200  ceFEPIme (MAXIPIME) 2 g in sodium chloride 0.9 % 100 mL IVPB     2 g 200 mL/hr over 30 Minutes Intravenous Every 8 hours 01/02/19 1813     01/02/19 1315  ceFEPIme (MAXIPIME) 2 g in sodium chloride 0.9 % 100 mL IVPB     2 g 200 mL/hr over 30 Minutes Intravenous  Once 01/02/19 1301 01/02/19 1401   01/02/19 1315  metroNIDAZOLE (FLAGYL) IVPB 500 mg     500 mg 100 mL/hr over 60 Minutes Intravenous  Once 01/02/19 1301 01/02/19 1454   01/02/19 1315  vancomycin (VANCOCIN) IVPB 1000 mg/200 mL premix  Status:  Discontinued     1,000 mg 200 mL/hr over 60 Minutes Intravenous  Once 01/02/19 1301 01/02/19 1309   01/02/19 1315  vancomycin (VANCOCIN) 2,000 mg in sodium chloride 0.9 % 500 mL IVPB     2,000 mg 250 mL/hr over 120 Minutes Intravenous  Once 01/02/19 1309 01/02/19 1718      Sch Meds:  Scheduled Meds: . amLODipine  10 mg Oral Daily  . Chlorhexidine Gluconate Cloth  6 each Topical Q0600  . lisinopril  20 mg Oral Daily   And  . hydrochlorothiazide  12.5 mg Oral Daily  . mouth rinse  15 mL Mouth Rinse BID  . senna  1 tablet Oral BID   Continuous Infusions: . ceFEPime (MAXIPIME) IV 2 g (01/05/19 0657)   PRN Meds:.acetaminophen **OR** acetaminophen, albuterol, labetalol, morphine injection, ondansetron **OR** ondansetron (ZOFRAN) IV, oxyCODONE, polyethylene glycol, traZODone   Taye T. [place]  If 7PM-7AM, please contact night-coverage [email or web address] Password [email or web address] 01/05/2019, 12:47 PM

## 2019-01-05 NOTE — Progress Notes (Signed)
OT Cancellation Note  Patient Details Name: Billy Rasmussen MRN: [id number] DOB: 1946/02/26   Cancelled Treatment:    Reason Eval/Treat Not Completed: OT screened, no needs identified, will sign off. Checked with pt/wife. He is moving well with PT and she is a CNA.  No OT needs.  Lovie Agresta 01/05/2019, 4:37 PM  Lesle Chris, OTR/L Acute Rehabilitation Services [phone] [facility] pager [phone] office 01/05/2019

## 2019-01-05 NOTE — Progress Notes (Signed)
Pharmacy Antibiotic Note  TYMEER VAQUERA is a 73 y.o. male with hx high risk bladder cancer status post robotic cystoprostatectomy in June 2020, recently hospitalized from 7/23-7/26 where he was treated for pyelonephritis and discharged with 8 days of oral zyvox for staph hemolytic and enterococcus faecalis UTI.  He presented back to the ED on 8/3 with c/o fever.  He's currently on cefepime for PsA UTI and bacteremia.  ID recommends to treat for 14 days with 8/5 as day #1.  Today, 01/05/2019: - afeb, wbc wnl - scr stable - plan for nephrostomy tube on 8/6  Plan: - continue cefepime 2gm IV q8h  ______________________________________  Height: 5\' 6"  (167.6 cm) Weight: 210 lb 5.1 oz (95.4 kg) IBW/kg (Calculated) : 63.8  Temp (24hrs), Avg:98.8 F (37.1 C), Min:98.1 F (36.7 C), Max:99.6 F (37.6 C)  Recent Labs  Lab 12/30/18 1133 01/02/19 1323 01/02/19 2305 01/03/19 0223 01/04/19 0218 01/05/19 0456  WBC 7.7 22.8* 18.2* 15.7* 8.6 6.2  CREATININE 1.07 1.07  --  1.10 0.95 0.85  LATICACIDVEN  --  1.3  --   --   --   --     Estimated Creatinine Clearance: 86.1 mL/min (by C-G formula based on SCr of 0.85 mg/dL).    No Known Allergies  Antimicrobials this admission:  8/3 vanc>> 8/5 8/3 cefepime>> 8/3 flagyl>> 8/5  Microbiology results:  7/23 uxx: 60K staph haemolyticus (S= nitro, rifam, vanc); 50K enteroccus faecalis (pans sens) FINAL  8/3 BCx x2: 1/4 bottles with GNR (BCID= PsA) PSA-pans sens FINAL 8/3 UCx: >100K PsA (pans sens) FINAL 8/4 MRSA PCR: neg 8/3 covid: neg  8/5 bcx x2:  Thank you for allowing pharmacy to be a part of this patient's care.  Lynelle Doctor 01/05/2019 1:04 PM

## 2019-01-05 NOTE — Evaluation (Signed)
Physical Therapy Evaluation Patient Details Name: Billy Rasmussen MRN: [id number] DOB: [birth year] Today's Date: 01/05/2019   History of Present Illness  73 y.o. male with history of bladder cancer status post radical cystoprostatectomy on 11/09/2018 with ileal conduit and lymph node resection, hypertension and osteoarthritis returning with fever, malaise and chills.    Clinical Impression  DERRION TRITZ is a 73 y.o. male admitted with above HPI and diagnosis of sepsis. He was is limited by functional impairments (see PT problem list) with mobility. Patient was able to complete bed mobility at Mod I level but was limited most by neck pain. He completed transfers and gait with RW with supervision to min guard assist as he was slightly unsteady without UE support. At baseline patient is independent with no device for all mobility. He will benefit from continued skilled PT through below venue to progress independence with mobility and return to PLOF. Acute PT will follow and progress as able.    Follow Up Recommendations Home health PT    Equipment Recommendations  None recommended by PT    Recommendations for Other Services       Precautions / Restrictions Precautions Precautions: Fall Restrictions Weight Bearing Restrictions: No      Mobility  Bed Mobility Overal bed mobility: Modified Independent             General bed mobility comments: pt taking extra time and moving slowly for bed mobility complainign of arthritis pain in neck  Transfers Overall transfer level: Needs assistance Equipment used: Rolling walker (2 wheeled) Transfers: Sit to/from Stand Sit to Stand: Supervision         General transfer comment: cues for sequencing hand placement for RW use  Ambulation/Gait Ambulation/Gait assistance: Min guard Gait Distance (Feet): 140 Feet Assistive device: Rolling walker (2 wheeled) Gait Pattern/deviations: Step-through pattern;Narrow base of support;Decreased  stride length Gait velocity: decreased   General Gait Details: pt slightly unsteady with gait and cues provided toincrease BOS slightly, improved as he continued to ambulate  Stairs            Wheelchair Mobility    Modified Rankin (Stroke Patients Only)       Balance Overall balance assessment: Needs assistance Sitting-balance support: No upper extremity supported;Feet supported Sitting balance-Leahy Scale: Good       Standing balance-Leahy Scale: Fair Standing balance comment: pt able to don face mask in standing, required UE support to march in place due to unsteadiness                  Pertinent Vitals/Pain Pain Assessment: Faces Faces Pain Scale: Hurts even more Pain Location: neck Pain Descriptors / Indicators: Aching(stiff) Pain Intervention(s): Limited activity within patient's tolerance;Monitored during session    [place] expects to be discharged to:: Private residence Living Arrangements: Spouse/significant other Available Help at Discharge: Family;Available 24 hours/day Type of Home: House Home Access: Stairs to enter Entrance Stairs-Rails: None Entrance Stairs-Number of Steps: 3 steps in front and 1 step in back Home Layout: One level Home Equipment: Walker - 2 wheels;Walker - 4 wheels;Cane - single point;Wheelchair - manual Additional Comments: pt's wife and daughter are CN's, pt's wife cares for her mother and father, currently her father is living at a facility and her mother lives with them    Prior Function Level of Independence: Independent               Hand Dominance   Dominant Hand: Right    Extremity/Trunk Assessment  Upper Extremity Assessment Upper Extremity Assessment: Overall WFL for tasks assessed    Lower Extremity Assessment Lower Extremity Assessment: Overall WFL for tasks assessed    Cervical / Trunk Assessment Cervical / Trunk Assessment: Normal  Communication   Communication: No  difficulties  Cognition Arousal/Alertness: Awake/alert Behavior During Therapy: WFL for tasks assessed/performed Overall Cognitive Status: Within Functional Limits for tasks assessed                            Assessment/Plan    PT Assessment Patient needs continued PT services  PT Problem List Decreased strength;Decreased balance;Decreased activity tolerance;Decreased mobility       PT Treatment Interventions DME instruction;Functional mobility training;Balance training;Patient/family education;Gait training;Therapeutic activities;Stair training;Therapeutic exercise    PT Goals (Current goals can be found in the Care Plan section)  Acute Rehab PT Goals Patient Stated Goal: pt wants to get home so he can have his arthritis medicine PT Goal Formulation: With patient Time For Goal Achievement: 01/12/19 Potential to Achieve Goals: Good    Frequency Min 3X/week    AM-PAC PT "6 Clicks" Mobility  Outcome Measure Help needed turning from your back to your side while in a flat bed without using bedrails?: A Little Help needed moving from lying on your back to sitting on the side of a flat bed without using bedrails?: A Little Help needed moving to and from a bed to a chair (including a wheelchair)?: A Little Help needed standing up from a chair using your arms (e.g., wheelchair or bedside chair)?: A Little Help needed to walk in hospital room?: A Little Help needed climbing 3-5 steps with a railing? : A Little 6 Click Score: 18    End of Session Equipment Utilized During Treatment: Gait belt Activity Tolerance: Patient tolerated treatment well Patient left: in chair;with call bell/phone within reach Nurse Communication: Mobility status PT Visit Diagnosis: Unsteadiness on feet (R26.81);Difficulty in walking, not elsewhere classified (R26.2)    Time: 0940-7680 PT Time Calculation (min) (ACUTE ONLY): 30 min   Charges:   PT Evaluation $PT Eval Low Complexity: 1 Low PT  Treatments $Gait Training: 8-22 mins       Kipp Brood, PT, DPT, [facility] Physical Therapist with [facility]  01/05/2019 1:05 PM

## 2019-01-05 NOTE — Procedures (Signed)
Pre Procedure Dx: Bladder cancer, post cystectomy, now with hydronephrosis; concern for ureteral stricture. Post Procedural Dx: Same  Successful [place] and fluoroscopic guided placement of a left sided PCN with end coiled and locked within the renal pelvis. PCN connected to gravity bag.  EBL: Minimal  Complications: None immediate.  Ronny Bacon, MD Pager #: [phone]

## 2019-01-05 NOTE — Progress Notes (Signed)
Return from IR condition stable. Dressing to L flank area clean dry and intact. Nephrostomy tube intact and draining. drainage dark blood noted. Urostomy drainage dark and old blood as well. No clots noted. Pt denies pain. Cont with plan of care

## 2019-01-05 NOTE — Care Management Important Message (Signed)
Important Message  Patient Details IM Letter given to [name] SW to present to the Patient Name: Billy Rasmussen MRN: [id number] Date of Birth: 16-Mar-1946   Medicare Important Message Given:  Yes     [name] 01/05/2019, 11:05 AM

## 2019-01-05 NOTE — Progress Notes (Signed)
OT Cancellation Note  Patient Details Name: Billy Rasmussen MRN: [id number] DOB: 1946-05-29   Cancelled Treatment:    Reason Eval/Treat Not Completed: Other (comment). Pt likely does not have OT needs.  RN will check for me, as he is sleeping soundly and is scheduled for a procedure this pm.  Emanuell Morina 01/05/2019, 2:13 PM  Lesle Chris, OTR/L Acute Rehabilitation Services [phone] [facility] pager [phone] office 01/05/2019

## 2019-01-05 NOTE — Plan of Care (Signed)
  Problem: Acute Rehab PT Goals(only PT should resolve) Goal: Pt Will Go Supine/Side To Sit Outcome: Progressing Flowsheets (Taken 01/05/2019 1306) Pt will go Supine/Side to Sit: Independently Goal: Patient Will Transfer Sit To/From Stand Outcome: Progressing Flowsheets (Taken 01/05/2019 1306) Patient will transfer sit to/from stand: with modified independence Goal: Pt Will Transfer Bed To Chair/Chair To Bed Outcome: Progressing Flowsheets (Taken 01/05/2019 1306) Pt will Transfer Bed to Chair/Chair to Bed: with modified independence Goal: Pt Will Ambulate Outcome: Progressing Flowsheets (Taken 01/05/2019 1306) Pt will Ambulate:  > 125 feet  with modified independence Goal: Pt Will Go Up/Down Stairs Outcome: Progressing Flowsheets (Taken 01/05/2019 1306) Pt will Go Up / Down Stairs:  3-5 stairs  with supervision  with least restrictive assistive device

## 2019-01-05 NOTE — Progress Notes (Signed)
Assumed care of the patient at this time. No changes in initial AM assessment at this time. Awaiting IR to come for pt. Cont. With plan of care

## 2019-01-06 ENCOUNTER — Inpatient Hospital Stay (HOSPITAL_COMMUNITY): Payer: Medicare Other

## 2019-01-06 ENCOUNTER — Inpatient Hospital Stay: Payer: Self-pay

## 2019-01-06 DIAGNOSIS — N131 Hydronephrosis with ureteral stricture, not elsewhere classified: Secondary | ICD-10-CM

## 2019-01-06 DIAGNOSIS — M150 Primary generalized (osteo)arthritis: Secondary | ICD-10-CM

## 2019-01-06 DIAGNOSIS — N12 Tubulo-interstitial nephritis, not specified as acute or chronic: Secondary | ICD-10-CM

## 2019-01-06 LAB — BASIC METABOLIC PANEL
Anion gap: 13 (ref 5–15)
BUN: 17 mg/dL (ref 8–23)
CO2: 26 mmol/L (ref 22–32)
Calcium: 9.9 mg/dL (ref 8.9–10.3)
Chloride: 97 mmol/L — ABNORMAL LOW (ref 98–111)
Creatinine, Ser: 0.84 mg/dL (ref 0.61–1.24)
GFR calc Af Amer: >60 mL/min (ref >60)
GFR calc non Af Amer: >60 mL/min (ref >60)
Glucose, Bld: 112 mg/dL — ABNORMAL HIGH (ref 70–99)
Potassium: 3.6 mmol/L (ref 3.5–5.1)
Sodium: 136 mmol/L (ref 135–145)

## 2019-01-06 LAB — SEDIMENTATION RATE: Sed Rate: 113 mm/hr — ABNORMAL HIGH (ref 0–16)

## 2019-01-06 LAB — C-REACTIVE PROTEIN: CRP: 21.5 mg/dL — ABNORMAL HIGH (ref <1.0)

## 2019-01-06 LAB — MAGNESIUM: Magnesium: 1.7 mg/dL (ref 1.7–2.4)

## 2019-01-06 MED ORDER — SODIUM CHLORIDE 0.9% FLUSH: 10.0000 mL | Freq: Two times a day (BID) | INTRAVENOUS | Status: DC

## 2019-01-06 MED ORDER — OXYCODONE HCL 5 MG PO TABS
5.0000 mg | ORAL_TABLET | ORAL | Status: DC | PRN
  Administered 2019-01-06: 10 mg via ORAL
  Filled 2019-01-06: qty 2

## 2019-01-06 MED ORDER — SODIUM CHLORIDE 0.9% FLUSH: 10.0000 mL | INTRAVENOUS | Status: DC | PRN

## 2019-01-06 MED ORDER — SODIUM CHLORIDE 0.9 % IV SOLN: 2.0000 g | Freq: Three times a day (TID) | INTRAVENOUS | 0 refills | Status: DC

## 2019-01-06 MED ORDER — MAGNESIUM SULFATE 2 GM/50ML IV SOLN
2.0000 g | Freq: Once | INTRAVENOUS | Status: AC
  Administered 2019-01-06: 2 g via INTRAVENOUS
  Filled 2019-01-06: qty 50

## 2019-01-06 MED ORDER — SODIUM CHLORIDE 0.9 % IV SOLN
INTRAVENOUS | Status: DC | PRN
  Administered 2019-01-06: 250 mL via INTRAVENOUS

## 2019-01-06 MED ORDER — AMLODIPINE BESYLATE 10 MG PO TABS: 10.0000 mg | ORAL_TABLET | Freq: Every day | ORAL | 0 refills | Status: DC

## 2019-01-06 MED ORDER — KETOROLAC TROMETHAMINE 15 MG/ML IJ SOLN
15.0000 mg | Freq: Three times a day (TID) | INTRAMUSCULAR | Status: DC | PRN
  Administered 2019-01-06: 15 mg via INTRAVENOUS
  Filled 2019-01-06: qty 1

## 2019-01-06 MED ORDER — SODIUM CHLORIDE 0.9 % IV SOLN: 2.0000 g | Freq: Three times a day (TID) | INTRAVENOUS | 0 refills | Status: AC

## 2019-01-06 NOTE — TOC Initial Note (Signed)
Transition of Care [facility]) - Initial/Assessment Note    Patient Details  Name: Billy Rasmussen MRN: [id number] Date of Birth: 1945/12/08  Transition of Care [facility]) CM/SW Contact:    Wende Neighbors, LCSW Phone Number: 01/06/2019, 2:09 PM  Clinical Narrative:     CSW and Carolynn Sayers ([facility]) met patient at bedside with wife present. Family agreeable with patient discharge home with [facility] nursing and PT per recommendation. Family stated they would prefer [facility] since they have heard good things about the agency. Family stated they do not need any DMEs.  Landry Corporal stated they will be able to follow patient for nursing and PT once medically stable. Carolynn Sayers stated they will follow patient for IV antibiotics needs and will communicate with [facility] if assistance is needed. Pam stated she will teach the wife how to administers antibiotics in the room    Expected Discharge Plan: [place] Barriers to Discharge: Continued Medical Work up   Patient Goals and CMS Choice Patient states their goals for this hospitalization and ongoing recovery are:: to be home with family CMS Medicare.gov Compare Post Acute Care list provided to:: Patient Choice offered to / list presented to : Patient, Spouse  Expected Discharge Plan and Services Expected Discharge Plan: [place] In-house Referral: Clinical Social Work Discharge Planning Services: CM Consult Post Acute Care Choice: [place] arrangements for the past 2 months: Single Family Home                           HH Arranged: PT, RN [place] Agency: [place] Date [facility] Agency Contacted: 01/06/19 Time HH Agency Contacted: 60 Representative spoke with at [place]: Tommi Rumps  Prior Living Arrangements/Services Living arrangements for the past 2 months: August with:: Self, Spouse, Parents Patient language and need for interpreter reviewed:: Yes Do you feel safe going back to  the place where you live?: Yes      Need for Family Participation in Patient Care: Yes (Comment) Care giver support system in place?: Yes (comment)   Criminal Activity/Legal Involvement Pertinent to Current Situation/Hospitalization: No - Comment as needed  Activities of Daily Living Home Assistive Devices/Equipment: Dentures (specify type), Eyeglasses, Cane (specify quad or straight), Hearing aid, Ostomy supplies(upper/lower partial, straight cane, B hearing aids, urostomy) ADL Screening (condition at time of admission) Patient's cognitive ability adequate to safely complete daily activities?: Yes Is the patient deaf or have difficulty hearing?: Yes Does the patient have difficulty seeing, even when wearing glasses/contacts?: Yes Does the patient have difficulty concentrating, remembering, or making decisions?: No Patient able to express need for assistance with ADLs?: Yes Does the patient have difficulty dressing or bathing?: No Independently performs ADLs?: Yes (appropriate for developmental age) Does the patient have difficulty walking or climbing stairs?: Yes Weakness of Legs: Both Weakness of Arms/Hands: Both  Permission Sought/Granted Permission sought to share information with : Case Manager Permission granted to share information with : Yes, Verbal Permission Granted  Share Information with NAME: Tyee Vandevoorde  Permission granted to share info w AGENCY: [facility]  Permission granted to share info w Relationship: spouse  Permission granted to share info w Contact Information: [phone]  Emotional Assessment Appearance:: Appears stated age Attitude/Demeanor/Rapport: Engaged Affect (typically observed): Accepting Orientation: : Oriented to Self, Oriented to Place, Oriented to  Time, Oriented to Situation Alcohol / Substance Use: Not Applicable Psych Involvement: No (comment)  Admission diagnosis:  Pyelonephritis [N12] Sepsis, due to unspecified organism,  unspecified whether acute organ dysfunction present [facility]) [A41.9] Patient Active Problem List   Diagnosis Date Noted  . Enterococcus UTI   . Urinary tract infection without hematuria   . Acute pyelonephritis   . Gastroesophageal reflux disease   . Sepsis ([place]) 12/22/2018  . AKI (acute kidney injury) ([place]) 12/22/2018  . Bladder cancer ([place]) 11/09/2018  . 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 Acute cystitis with hematuria 10/02/2016  . Pure hypercholesterolemia 10/02/2016  . Malignant neoplasm of urinary bladder ([place]) 10/02/2016  . Lipoma 09/30/2016  . Obese 03/31/2016  . S/P right TKA 03/30/2016  . S/P knee replacement 03/30/2016  . HTN (hypertension) 02/25/2016  . Healthcare maintenance 02/25/2016  . Obesity (BMI 30-39.9) 01/07/2016  . S/P shoulder replacement 07/20/2013  . Shoulder arthritis 04/07/2013  . CELLULITIS, KNEE, LEFT 08/26/2006   PCP:  Terald Sleeper, PA-C Pharmacy:   [id number] [id number]: [phone]: [phone] [facility] [place] [address] [id number]: [phone]: [phone]    Social Determinants of Health (SDOH) Interventions    Readmission Risk Interventions No flowsheet data found.

## 2019-01-06 NOTE — Discharge Summary (Signed)
Physician Discharge Summary  Billy Rasmussen NOB:[id number] DOB: August 25, 1945 DOA: 01/02/2019  PCP: Terald Sleeper, PA-C  Admit date: 01/02/2019 Discharge date: 01/06/2019  Admitted From: Home Disposition: Home  Recommendations for Outpatient Follow-up:  1. Follow up with PCP in 1-2 weeks 2. Please obtain CBC/BMP/Mag at follow up 3. Please follow up on the following pending results: None  Home Health: PT/RN Equipment/Devices: None per patient's request  Discharge Condition: Stable CODE STATUS: Full code  Hospital Course: 73 y.o.malewith history ofbladder cancer status post radical cystoprostatectomy on 11/09/2018 with ileal conduit and lymph node resection, hypertensionandosteoarthritis returning with fever, malaise and chills for 1 day.   Recently hospitalized 7/23-7/26 for sepsis due to acute pyelonephritis.Urine culture at that time grew Staphylococcus hemolyticus and enterococcus faecalis.He was discharged on Zyvox to complete 10 days course but stopped 4 days early.  Reportedly urine culture at that time also grew Pseudomonas.  In ED, septic with fever, tachycardia, tachypnea and leukocytosis.  Urinalysis concerning for UTI.CT A/P revealed persistent left hydroureteronephrosis, left pyelonephritis,underlying left ureter anastomotic stricture. Urology consulted.Started on vancomycin, cefepime and Flagyl,and hospital service was called for admission.  Blood and urine culture grew Pseudomonas aeruginosa.  Antibiotic de-escalated to cefepime. ID consulted and recommended IV cefepime for 14 days starting 01/04/2019.  Repeat blood culture from 01/04/2019 remain negative for 48 hours.  PICC line placed 01/06/2019.  Discharged on IV cefepime per ID recommendations.  HH PT/RN arranged by CSW/case management.  See individual problem list below for more.  Discharge Diagnoses:  Sepsis due to Pseudomonas bacteremia/pyelonephritis with left hydronephrosis: POA -Sepsis physiology resolved.   Fever curve downtrending.  -Has recurrent bladder ca s/p radical cystoprostatectomy and ileal conduit on 11/09/2018 -CT A/P persistent left hydroureteronephrosis and left pyelonephritis -Ileal conduit patent on CT abdomen and pelvis. -Nephrostomy tube by IR on 01/05/2019 for left hydronephrosis. -Blood and urine culture on 01/02/2019 grew Pseudomonas. -Repeat blood culture on 01/04/2019- for 48 hours. -Vancomycin and Flagyl 8/3-8/5 -Continue cefepime 8/3>>.  Plan is for 14 days starting 01/04/2019 per ID. -Appreciate urology input.  Plan for left nephrostomy tube by IR on 01/05/19. -PICC line placed on 01/06/2019. -Patient discharged on IV cefepime to complete course for 14 days starting 01/04/2019 -Home health PT/RN arranged. -Recommend follow-up with urology and PCP next week.  Recurrent High Grade Bladder Cancer - s/p robotic cystoprostatectomy with ICG sentinal + template lymphadenctomy, extensive adhesions, and conduit diversion 10/2018 for [id number] cancer with NEGATIVE margins. BCG refractory disease prior. Urethra remains in site.  -Follow-up with urology.  Acute metabolic encephalopathy: Likely due to the above.  Resolved.  Hypertension:BP slightly elevated but improved. -Continue home lisinopril/HCTZ -Added amlodipine at 10 mg daily.  Fever and leukocytosis with bandemia: Resolved. -Likely due to #1  Tachypnea and tachycardia: CXR without acute finding. EKG features junctional tachycardia.  Resolved. -Likely due to #1.   Hypokalemia: Resolved.  Magnesium was normal range.  Normocytic anemia: Likely hemo-dilutional.  May be hemoconcentrated on arrival.  H&H stable. -Continue monitoring  Osteoarthritis: -Discharged on home medications-on oxycodone and diclofenac at home. -Home health PT.  Discharge Instructions  Discharge Instructions    Call MD for:  extreme fatigue   Complete by: As directed    Call MD for:  persistant dizziness or light-headedness   Complete by: As  directed    Call MD for:  persistant nausea and vomiting   Complete by: As directed    Call MD for:  redness, tenderness, or signs of infection (pain, swelling, redness, odor  or green/yellow discharge around incision site)   Complete by: As directed    Call MD for:  severe uncontrolled pain   Complete by: As directed    Call MD for:  temperature >100.4   Complete by: As directed    Diet - low sodium heart healthy   Complete by: As directed    Discharge instructions   Complete by: As directed    It has been a pleasure taking care of you! You were admitted with kidney infection and bladder infection.  You were treated with IV antibiotics and nephrostomy tube to your kidney.  You are discharged on IV antibiotics to complete treatment course for 2 weeks.   Please review your new medication list and the directions before you take your medications. Please call your primary care office and urologist as soon as possible to schedule a follow-up appointment next week.  Take care,   Increase activity slowly   Complete by: As directed      Allergies as of 01/06/2019   No Known Allergies     Medication List    STOP taking these medications   cefpodoxime 200 MG tablet Commonly known as: VANTIN   linezolid 600 MG tablet Commonly known as: ZYVOX   sulfamethoxazole-trimethoprim 800-160 MG tablet Commonly known as: BACTRIM DS     TAKE these medications   amLODipine 10 MG tablet Commonly known as: NORVASC Take 1 tablet (10 mg total) by mouth daily. Start taking on: January 07, 2019   ceFEPIme 2 g in sodium chloride 0.9 % 100 mL Inject 2 g into the vein every 8 (eight) hours for 12 days.   diclofenac 75 MG EC tablet Commonly known as: VOLTAREN Take 1 tablet (75 mg total) by mouth 2 (two) times daily. What changed:   how much to take  when to take this   lisinopril-hydrochlorothiazide 20-12.5 MG tablet Commonly known as: ZESTORETIC Take 1 tablet by mouth daily.   oxyCODONE 5 MG  immediate release tablet Commonly known as: Oxy IR/ROXICODONE Take 1 tablet (5 mg total) by mouth every 4 (four) hours as needed for moderate pain or severe pain. Post-Operatively   polyethylene glycol 17 g packet Commonly known as: MIRALAX / GLYCOLAX Take 17 g by mouth daily as needed for mild constipation.   senna-docusate 8.6-50 MG tablet Commonly known as: Senokot-S Take 1 tablet by mouth 2 (two) times daily.      Follow-up Information    [facility], [facility] Follow up.   Specialty: Home Health Services Why: Follow for Nursing and Physcial Therapy needs  Contact information: [address] [place] [address] [phone]           Consultations:  Infectious disease  Urology  IR  Procedures/Studies:  2D Echo: None  Ct Abdomen Pelvis W Contrast  Result Date: 01/02/2019 CLINICAL DATA:  73 year old with fevers and sepsis. History of bladder cancer and status post radical cystoprostatectomy with ileal conduit. EXAM: CT ABDOMEN AND PELVIS WITH CONTRAST TECHNIQUE: Multidetector CT imaging of the abdomen and pelvis was performed using the standard protocol following bolus administration of intravenous contrast. CONTRAST:  152mL OMNIPAQUE IOHEXOL 300 MG/ML  SOLN COMPARISON:  CT 12/22/2018 FINDINGS: Lower chest: Lung bases are clear.  No large pleural effusions. Hepatobiliary: Numerous calcified gallstones. No gallbladder distention and no gallbladder inflammatory changes. Normal appearance of the liver without biliary dilatation. Pancreas: Unremarkable. No pancreatic ductal dilatation or surrounding inflammatory changes. Spleen: Normal in size without focal abnormality. Adrenals/Urinary Tract: Normal  adrenal glands. Normal appearance of the right kidney without hydronephrosis. History of radical cystoprostatectomy with ileal conduit. Increased left perinephric edema with at least mild left hydroureteronephrosis. Left ureter is dilated to the region of the  surgical anastomosis. Delayed excretion of contrast from the left renal collecting system. Stomach/Bowel: Normal appearance of the stomach and duodenum. Patient has a urinary diversion with an ileal conduit. No gross abnormality at the ileal conduit. Expected postoperative changes in the bowel. No evidence for focal bowel inflammation or bowel obstruction. Vascular/Lymphatic: Atherosclerotic calcifications in the aorta and iliac arteries without aneurysm. Main visceral arteries are patent. There is no significant abdominopelvic lymphadenopathy. Reproductive: Radical cystoprostatectomy. Again noted is a small amount of fluid at the surgical bed. Postoperative fluid collection measures 7.2 x 4.3 x 4.9 cm and previously measured 7.4 x 4.7 x 6.4 cm. No gas within the fluid collection. Other: Negative for free air.  Negative for ascites. Musculoskeletal: Degenerative facet disease in lower lumbar spine. Irregularity along superior endplate at L3 is stable. No acute bone abnormality. IMPRESSION: 1. Increased inflammatory changes centered around the left kidney with persistent left hydroureteronephrosis. Findings remain concerning for pyelonephritis but there is also concern for an underlying left ureter anastomotic stricture. 2. Cholelithiasis without acute inflammatory changes. 3. Postsurgical changes related to radical cystoprostatectomy. Postoperative fluid collection in the pelvis has slightly decreased in size and there is no gas within the collection. Findings are most compatible with a resolving postoperative fluid collection rather than an abscess. Electronically Signed   By: Markus Daft M.D.   On: 01/02/2019 16:46   Ct Abdomen Pelvis W Contrast  Result Date: 12/22/2018 CLINICAL DATA:  Abdominal pain and fevers, history of prior cystectomy on 11/09/2018 EXAM: CT ABDOMEN AND PELVIS WITH CONTRAST TECHNIQUE: Multidetector CT imaging of the abdomen and pelvis was performed using the standard protocol following  bolus administration of intravenous contrast. CONTRAST:  13mL OMNIPAQUE IOHEXOL 300 MG/ML  SOLN COMPARISON:  10/13/2018 FINDINGS: Lower chest: Lung bases are free of acute infiltrate or sizable effusion. No parenchymal nodules are noted. Hepatobiliary: Diffuse fatty infiltration of the liver is noted. The gallbladder is well distended with multiple gallstones within. No pericholecystic fluid or wall thickening is noted. Pancreas: Unremarkable. No pancreatic ductal dilatation or surrounding inflammatory changes. Spleen: Normal in size without focal abnormality. Adrenals/Urinary Tract: Adrenal glands are within normal limits. The right kidney is well visualize without renal calculi or obstructive change. The left kidney demonstrates slight delay in opacification with respect to the right. Mild obstructive changes are noted no obstructing stone is identified. The dilatation may be related to some edema at the insertion site in the ileostomy. The bladder has been surgically removed consistent with the patient's given clinical history. Fluid is noted in the surgical bed which measures approximately the 7.5 by the 6.1 cm in greatest dimension. It is slightly more prominent to the right of the midline. No air is noted or enhancement to suggest abscess in this may simply represent a postoperative seroma. In the right lower quadrant there is an ileostomy identified. Stomach/Bowel: Scattered diverticular change of the colon is noted. No diverticulitis is seen. The appendix is not visualized consistent with a prior surgical history. No obstructive changes of the small bowel are seen. Stomach is within normal limits. Vascular/Lymphatic: Aortic atherosclerosis. No enlarged abdominal or pelvic lymph nodes. Reproductive: Prostate has been surgically removed. Other: No abdominal wall hernia or abnormality. Musculoskeletal: No acute bony abnormality is seen. Degenerative changes of the lumbar spine are noted.  IMPRESSION: Changes  consistent with prior cystoprostatectomy with a fluid collection in the surgical bed likely representing a postop seroma. No findings to suggest focal abscess are identified. Dilatation of the left collecting system with delayed enhancement which extends to the level of the anastomosis with the ileal conduit. No stone is identified in this may represent edema at the anastomotic site. This is likely the etiology of the patient's abdominal pain. Cholelithiasis without complicating factors. Diverticulosis without evidence of diverticulitis. Electronically Signed   By: Inez Catalina M.D.   On: 12/22/2018 16:30   Dg Chest Port 1 View  Result Date: 01/02/2019 CLINICAL DATA:  Fever since yesterday.  Altered mental status. EXAM: PORTABLE CHEST 1 VIEW COMPARISON:  Radiographs 12/22/2018 and 02/25/2016. FINDINGS: 1320 hours. The heart size and mediastinal contours are normal. The lungs are clear. There is no pleural effusion or pneumothorax. No acute osseous findings are identified. Patient is status post bilateral total shoulder arthroplasty. Telemetry leads overlie the chest. IMPRESSION: Stable chest without evidence of active cardiopulmonary process. Electronically Signed   By: Richardean Sale M.D.   On: 01/02/2019 13:59   Dg Chest Port 1 View  Result Date: 12/22/2018 CLINICAL DATA:  Fever, malaise EXAM: PORTABLE CHEST 1 VIEW COMPARISON:  02/25/2016 FINDINGS: The heart size and mediastinal contours are within normal limits. Both lungs are clear. Right shoulder arthroplasty. IMPRESSION: No active disease. Electronically Signed   By: Davina Poke M.D.   On: 12/22/2018 13:38   Ir Nephrostomy Placement Left  Result Date: 01/05/2019 INDICATION: History of bladder cancer, now with concern for ureteral obstruction and pelvicaliectasis potential pyelonephritis. Please perform image guided left-sided nephrostomy catheter placement for infection source control and renal preservation purposes. EXAM: 1. ULTRASOUND  GUIDANCE FOR PUNCTURE OF THE LEFT RENAL COLLECTING SYSTEM 2. LEFT PERCUTANEOUS NEPHROSTOMY TUBE PLACEMENT. COMPARISON:  CT abdomen and pelvis - 01/02/2019 MEDICATIONS: Patient is currently admitted to the hospital receiving intravenous antibiotics; the antibiotic was administered in an appropriate time frame prior to skin puncture. ANESTHESIA/SEDATION: Moderate (conscious) sedation was employed during this procedure. A total of Versed 4 mg and Fentanyl 100 mcg was administered intravenously. Moderate Sedation Time: 16 minutes. The patient's level of consciousness and vital signs were monitored continuously by radiology nursing throughout the procedure under my direct supervision. CONTRAST:  20 cc Omnipaque 300 administered into the collecting system FLUOROSCOPY TIME:  3 minutes, 24 seconds (75 mGy) COMPLICATIONS: None immediate. PROCEDURE: The procedure, risks, benefits, and alternatives were explained to the patient. Questions regarding the procedure were encouraged and answered. The patient understands and consents to the procedure. A timeout was performed prior to the initiation of the procedure. The left flank region was prepped with Betadine in a sterile fashion, and a sterile drape was applied covering the operative field. A sterile gown and sterile gloves were used for the procedure. Local anesthesia was provided with 1% Lidocaine with epinephrine. Ultrasound was used to localize the left kidney. Under direct ultrasound guidance, a 21 gauge needle was advanced into the renal collecting system. An ultrasound image documentation was performed. Access within the collecting system was confirmed with the efflux of urine followed by contrast injection. Over a Nitrex wire, the inner three [place] catheter of an Accustick set was advanced into the renal collecting system. Contrast injection was injected into the collecting system as several spot radiographs were obtained in various obliquities confirming puncture  within a posterior inferior calix. As such, the tract was dilated with an Accustick stent. Over a guide wire, a  10-French percutaneous nephrostomy catheter was advanced into the collecting system where the coil was formed and locked. Contrast was injected and several sport radiographs were obtained in various obliquities confirming access. The catheter was secured at the skin with a Prolene retention suture and a gravity bag was placed. A dressing was placed. The patient tolerated procedure well without immediate postprocedural complication. FINDINGS: Ultrasound scanning demonstrates a mildly dilated left renal collecting system as was demonstrated on preceding CT scan. Under direct ultrasound guidance, a posterior inferior calix was targeted allowing advancement of an 10-French percutaneous nephrostomy catheter under intermittent fluoroscopic guidance. Contrast injection confirmed appropriate positioning. IMPRESSION: Successful ultrasound and fluoroscopic guided placement of a left sided 10 French PCN. Electronically Signed   By: Sandi Mariscal M.D.   On: 01/05/2019 18:02   [place] Ekg Site Rite  Result Date: 01/06/2019 If Site Rite image not attached, placement could not be confirmed due to current cardiac rhythm.    Subjective: No major events overnight of this morning.  Had a mild temperature to 100.5 overnight.  Complains multiple joints pain including arms shoulder and neck.  Has history of osteoarthritis.  Denies history of rheumatoid arthritis or inflammatory disease.    Discharge Exam: Vitals:   01/06/19 1108 01/06/19 1316  BP: (!) 150/84 (!) 154/89  Pulse: 96 89  Resp: 18 18  Temp: 99 F (37.2 C) 98.7 F (37.1 C)  SpO2: 95% 96%    GENERAL: No acute distress.  Appears well.  HEENT: MMM.  Vision and hearing grossly intact.  NECK: Supple.  No JVD.  LUNGS:  No IWOB. Good air movement bilaterally. HEART:  RRR. Heart sounds normal.  ABD: Bowel sounds present. Soft. Non tender.  RLQ urostomy.   Left nephrostomy. MSK/EXT:  Moves all extremities.  Tenderness in his arms and shoulders.  No apparent swelling or significant erythema. SKIN: no apparent skin lesion or wound NEURO: Awake, alert and oriented appropriately.  No gross deficit.  PSYCH: Calm. Normal affect.   The results of significant diagnostics from this hospitalization (including imaging, microbiology, ancillary and laboratory) are listed below for reference.     Microbiology: Recent Results (from the past 240 hour(s))  Blood Culture (routine x 2)     Status: None (Preliminary result)   Collection Time: 01/02/19  1:23 PM   Specimen: Site Not Specified; Blood  Result Value Ref Range Status   Specimen Description   Final    SITE NOT SPECIFIED Performed at [facility] [address], [place], [place] [address]    Special Requests   Final    BOTTLES DRAWN AEROBIC AND ANAEROBIC Blood Culture adequate volume Performed at [facility] [address] [place], [place] [address]    Culture   Final    NO GROWTH 4 DAYS Performed at [facility], [place] [address], [place], [place] [address]    Report Status PENDING  Incomplete  Blood Culture (routine x 2)     Status: Abnormal   Collection Time: 01/02/19  1:23 PM   Specimen: Site Not Specified; Blood  Result Value Ref Range Status   Specimen Description   Final    SITE NOT SPECIFIED Performed at [facility] [address] [place], [place] [address]    Special Requests   Final    BOTTLES DRAWN AEROBIC AND ANAEROBIC Blood Culture adequate volume Performed at [facility] [address], [place],  [address]    Culture  Setup  Time   Final    GRAM NEGATIVE RODS AEROBIC BOTTLE ONLY CRITICAL RESULT CALLED TO, READ BACK BY AND VERIFIED WITH: Seleta Rhymes PharmD 12:50 01/03/19 ([email or web address]) Performed at [facility], [place] [address], [place], [place] [address]    Culture PSEUDOMONAS  AERUGINOSA (A)  Final   Report Status 01/05/2019 FINAL  Final   Organism ID, Bacteria PSEUDOMONAS AERUGINOSA  Final      Susceptibility   Pseudomonas aeruginosa - MIC*    CEFTAZIDIME 4 SENSITIVE Sensitive     CIPROFLOXACIN <=0.25 SENSITIVE Sensitive     GENTAMICIN <=1 SENSITIVE Sensitive     IMIPENEM 2 SENSITIVE Sensitive     PIP/TAZO 16 SENSITIVE Sensitive     CEFEPIME 4 SENSITIVE Sensitive     * PSEUDOMONAS AERUGINOSA  Urine culture     Status: Abnormal   Collection Time: 01/02/19  1:23 PM   Specimen: In/Out Cath Urine  Result Value Ref Range Status   Specimen Description   Final    IN/OUT CATH URINE Performed at [facility], [place] [address], [place], [place] [address]    Special Requests   Final    NONE Performed at [facility], [place] [address] [place], [place] [address]    Culture >=100,000 COLONIES/mL PSEUDOMONAS AERUGINOSA (A)  Final   Report Status 01/04/2019 FINAL  Final   Organism ID, Bacteria PSEUDOMONAS AERUGINOSA (A)  Final      Susceptibility   Pseudomonas aeruginosa - MIC*    CEFTAZIDIME 4 SENSITIVE Sensitive     CIPROFLOXACIN <=0.25 SENSITIVE Sensitive     GENTAMICIN <=1 SENSITIVE Sensitive     IMIPENEM 1 SENSITIVE Sensitive     PIP/TAZO 8 SENSITIVE Sensitive     CEFEPIME 2 SENSITIVE Sensitive     * >=100,000 COLONIES/mL PSEUDOMONAS AERUGINOSA  Blood Culture ID Panel (Reflexed)     Status: Abnormal   Collection Time: 01/02/19  1:23 PM  Result Value Ref Range Status   Enterococcus species NOT DETECTED NOT DETECTED Final   Listeria monocytogenes NOT DETECTED NOT DETECTED Final   Staphylococcus species NOT DETECTED NOT DETECTED Final   Staphylococcus aureus (BCID) NOT DETECTED NOT DETECTED Final   Streptococcus species NOT DETECTED NOT DETECTED Final   Streptococcus agalactiae NOT DETECTED NOT DETECTED Final   Streptococcus pneumoniae NOT DETECTED NOT DETECTED Final   Streptococcus pyogenes NOT DETECTED NOT DETECTED  Final   Acinetobacter baumannii NOT DETECTED NOT DETECTED Final   Enterobacteriaceae species NOT DETECTED NOT DETECTED Final   Enterobacter cloacae complex NOT DETECTED NOT DETECTED Final   Escherichia coli NOT DETECTED NOT DETECTED Final   Klebsiella oxytoca NOT DETECTED NOT DETECTED Final   Klebsiella pneumoniae NOT DETECTED NOT DETECTED Final   Proteus species NOT DETECTED NOT DETECTED Final   Serratia marcescens NOT DETECTED NOT DETECTED Final   Carbapenem resistance NOT DETECTED NOT DETECTED Final   Haemophilus influenzae NOT DETECTED NOT DETECTED Final   Neisseria meningitidis NOT DETECTED NOT DETECTED Final   Pseudomonas aeruginosa DETECTED (A) NOT DETECTED Final    Comment: CRITICAL RESULT CALLED TO, READ BACK BY AND VERIFIED WITH: Seleta Rhymes PharmD 12:50 01/03/19 ([email or web address])    Candida albicans NOT DETECTED NOT DETECTED Final   Candida glabrata NOT DETECTED NOT DETECTED Final   Candida krusei NOT DETECTED NOT DETECTED Final   Candida parapsilosis NOT DETECTED NOT DETECTED Final   Candida tropicalis NOT DETECTED NOT DETECTED Final    Comment: Performed at [facility], [address], [place], [place] [address]  SARS Coronavirus 2 [facility] order, Performed in [facility] hospital lab) Nasopharyngeal Nasopharyngeal Swab     Status: None   Collection Time: 01/02/19  1:47 PM   Specimen: Nasopharyngeal Swab  Result Value Ref Range Status   SARS Coronavirus 2 NEGATIVE NEGATIVE Final    Comment: (NOTE) If result is NEGATIVE SARS-CoV-2 target nucleic acids are NOT DETECTED. The SARS-CoV-2 RNA is generally detectable in upper and lower  respiratory specimens during the acute phase of infection. The lowest  concentration of SARS-CoV-2 viral copies this assay can detect is 250  copies / mL. A negative result does not preclude SARS-CoV-2 infection  and should not be used as the sole basis for treatment or other  patient management decisions.  A negative result may occur with    improper specimen collection / handling, submission of specimen other  than nasopharyngeal swab, presence of viral mutation(s) within the  areas targeted by this assay, and inadequate number of viral copies  (<250 copies / mL). A negative result must be combined with clinical  observations, patient history, and epidemiological information. If result is POSITIVE SARS-CoV-2 target nucleic acids are DETECTED. The SARS-CoV-2 RNA is generally detectable in upper and lower  respiratory specimens dur ing the acute phase of infection.  Positive  results are indicative of active infection with SARS-CoV-2.  Clinical  correlation with patient history and other diagnostic information is  necessary to determine patient infection status.  Positive results do  not rule out bacterial infection or co-infection with other viruses. If result is PRESUMPTIVE POSTIVE SARS-CoV-2 nucleic acids MAY BE PRESENT.   A presumptive positive result was obtained on the submitted specimen  and confirmed on repeat testing.  While 2019 novel coronavirus  (SARS-CoV-2) nucleic acids may be present in the submitted sample  additional confirmatory testing may be necessary for epidemiological  and / or clinical management purposes  to differentiate between  SARS-CoV-2 and other Sarbecovirus currently known to infect humans.  If clinically indicated additional testing with an alternate test  methodology [phone]) is advised. The SARS-CoV-2 RNA is generally  detectable in upper and lower respiratory sp ecimens during the acute  phase of infection. The expected result is Negative. Fact Sheet for Patients:  StrictlyIdeas.no Fact Sheet for Healthcare Providers: BankingDealers.co.za This test is not yet approved or cleared by the [place] FDA and has been authorized for detection and/or diagnosis of SARS-CoV-2 by FDA under an Emergency Use Authorization (EUA).  This EUA will  remain in effect (meaning this test can be used) for the duration of the COVID-19 declaration under Section 564(b)(1) of the Act, 21 U.S.C. section 360bbb-3(b)(1), unless the authorization is terminated or revoked sooner. Performed at [facility], [place] [address], [place], [place] [address]   MRSA PCR Screening     Status: None   Collection Time: 01/03/19  2:00 AM   Specimen: Nasopharyngeal Wash  Result Value Ref Range Status   MRSA by PCR NEGATIVE NEGATIVE Final    Comment:        The GeneXpert MRSA Assay (FDA approved for NASAL specimens only), is one component of a comprehensive MRSA colonization surveillance program. It is not intended to diagnose MRSA infection nor to guide or monitor treatment for MRSA infections. Performed at [facility], [place] [address] [place], [place] [address]   Culture, blood (routine x 2)     Status: None (Preliminary result)   Collection Time: 01/04/19  1:20 PM  Specimen: BLOOD LEFT HAND  Result Value Ref Range Status   Specimen Description   Final    BLOOD LEFT HAND Performed at [facility] [address] [place], [place] [address]    Special Requests   Final    BOTTLES DRAWN AEROBIC ONLY Blood Culture results may not be optimal due to an inadequate volume of blood received in culture bottles Performed at [facility] [address] [place], [place] [address]    Culture   Final    NO GROWTH 2 DAYS Performed at [facility] [address] [place], [place] [address]    Report Status PENDING  Incomplete  Culture, blood (routine x 2)     Status: None (Preliminary result)   Collection Time: 01/04/19  1:27 PM   Specimen: BLOOD RIGHT HAND  Result Value Ref Range Status   Specimen Description   Final    BLOOD RIGHT HAND Performed at [facility] [address], [place], [place] [address]    Special Requests   Final    BOTTLES DRAWN  AEROBIC ONLY Blood Culture adequate volume Performed at [facility] [address], [place], [place] [address]    Culture   Final    NO GROWTH 2 DAYS Performed at [facility] [address], [place], [place] [address]    Report Status PENDING  Incomplete     Labs: BNP (last 3 results) No results for input(s): BNP in the last 8760 hours. Basic Metabolic Panel: Recent Labs  Lab 01/02/19 1323 01/03/19 0223 01/04/19 0218 01/05/19 0456 01/06/19 0414  NA 136 135 137 134* 136  K 3.8 3.3* 3.8 3.7 3.6  CL 98 102 106 100 97*  CO2 25 23 24 26 26   GLUCOSE 149* 137* 116* 98 112*  BUN 17 16 16 13 17   CREATININE 1.07 1.10 0.95 0.85 0.84  CALCIUM 9.8 8.8* 9.0 9.3 9.9  MG  --  1.7 1.8 1.7 1.7   Liver Function Tests: Recent Labs  Lab 01/02/19 1323  AST 34  ALT 22  ALKPHOS 72  BILITOT 1.1  PROT 8.4*  ALBUMIN 4.1   Recent Labs  Lab 01/02/19 1323  LIPASE 27   No results for input(s): AMMONIA in the last 168 hours. CBC: Recent Labs  Lab 01/02/19 1323 01/02/19 2305 01/03/19 0223 01/04/19 0218 01/05/19 0456  WBC 22.8* 18.2* 15.7* 8.6 6.2  NEUTROABS 21.1*  --   --   --   --   HGB 13.1 10.7* 10.9* 10.8* 10.8*  HCT 41.8 34.0* 34.3* 35.5* 33.9*  MCV 91.5 91.9 92.2 93.4 90.4  PLT 336 243 239 200 231   Cardiac Enzymes: No results for input(s): CKTOTAL, CKMB, CKMBINDEX, TROPONINI in the last 168 hours. BNP: Invalid input(s): POCBNP CBG: No results for input(s): GLUCAP in the last 168 hours. D-Dimer No results for input(s): DDIMER in the last 72 hours. Hgb A1c No results for input(s): HGBA1C in the last 72 hours. Lipid Profile No results for input(s): CHOL, HDL, LDLCALC, TRIG, CHOLHDL, LDLDIRECT in the last 72 hours. Thyroid function studies No results for input(s): TSH, T4TOTAL, T3FREE, THYROIDAB in the last 72 hours.  Invalid input(s): FREET3 Anemia work up No results for input(s): VITAMINB12, FOLATE, FERRITIN, TIBC, IRON, RETICCTPCT in  the last 72 hours. Urinalysis    Component Value Date/Time   COLORURINE YELLOW 01/02/2019 1323   APPEARANCEUR HAZY (A) 01/02/2019 1323   APPEARANCEUR Cloudy (A) 01/27/2018  1625   LABSPEC 1.012 01/02/2019 1323   PHURINE 7.0 01/02/2019 1323   GLUCOSEU NEGATIVE 01/02/2019 1323   HGBUR SMALL (A) 01/02/2019 [place] 01/02/2019 1323   BILIRUBINUR Negative 01/27/2018 1625   KETONESUR NEGATIVE 01/02/2019 1323   PROTEINUR 30 (A) 01/02/2019 1323   NITRITE POSITIVE (A) 01/02/2019 1323   LEUKOCYTESUR LARGE (A) 01/02/2019 1323   Sepsis Labs Invalid input(s): PROCALCITONIN,  WBC,  LACTICIDVEN   Time coordinating discharge: 40 minutes  SIGNED:  Mercy Riding, MD  [facility] 01/06/2019, 4:28 PM  If 7PM-7AM, please contact night-coverage [email or web address] Password [email or web address]

## 2019-01-06 NOTE — Progress Notes (Signed)
Physical Therapy Treatment Patient Details Name: KHAYMAN KIRSCH MRN: [id number] DOB: [birth year] Today's Date: 01/06/2019    History of Present Illness 73 y.o. male with history of bladder cancer status post radical cystoprostatectomy on 11/09/2018 with ileal conduit and lymph node resection, hypertension and osteoarthritis returning with fever, malaise and chills.    PT Comments    Patient greatly limited today by increased pain related to his arthritis. He required moderate assist to perform bed mobility today but was able to transfer sit to stand with min assist to initiate power up to RW. He required min guard and chair follow provided for ambulation today for safety secondary to patient complaints of pain and his increased difficulty mobilizing. EOS assisted patient back to supine in bed for lab draw, he required assistance to lift bil LE's into bed. Pt's IV in Rt arm noted to be leaking and RN alerted. Patient will continue to benefit from skilled PT interventions to address ongoing impairments and progress independence with mobility as able.   Follow Up Recommendations  Home health PT     Equipment Recommendations  None recommended by PT    Recommendations for Other Services       Precautions / Restrictions Precautions Precautions: Fall Restrictions Weight Bearing Restrictions: No    Mobility  Bed Mobility Overal bed mobility: Needs Assistance Bed Mobility: Supine to Sit;Sit to Supine     Supine to sit: Mod assist;HOB elevated Sit to supine: Mod assist;HOB elevated   General bed mobility comments: pt limited greatly today by increased pain all over, he required assistance for LE mobility and to initiate press up from Lt side-lying with HOB elevated  Transfers Overall transfer level: Needs assistance Equipment used: Rolling walker (2 wheeled) Transfers: Sit to/from Stand Sit to Stand: Min assist         General transfer comment: cues for sequencing hand placement  for RW, min assist to initiate power up  Ambulation/Gait Ambulation/Gait assistance: Min guard Gait Distance (Feet): 80 Feet Assistive device: Rolling walker (2 wheeled) Gait Pattern/deviations: Step-through pattern;Narrow base of support;Decreased stride length Gait velocity: decreased   General Gait Details: pt sligthly unsteady and limited gripping in Rt hand due to pain, pt's spouse assisted with IV pole management   Stairs             Wheelchair Mobility    Modified Rankin (Stroke Patients Only)       Balance Overall balance assessment: Needs assistance Sitting-balance support: Feet supported;No upper extremity supported Sitting balance-Leahy Scale: Fair     Standing balance support: During functional activity;Bilateral upper extremity supported Standing balance-Leahy Scale: Fair Standing balance comment: required UE support for standing and gait               Cognition Arousal/Alertness: Awake/alert Behavior During Therapy: WFL for tasks assessed/performed Overall Cognitive Status: Within Functional Limits for tasks assessed                      Pertinent Vitals/Pain Pain Assessment: 0-10 Pain Score: 10-Worst pain ever Pain Location: neck, elbows, thumbs, shoulders, all over Pain Descriptors / Indicators: Aching Pain Intervention(s): Limited activity within patient's tolerance;Premedicated before session;Monitored during session           PT Goals (current goals can now be found in the care plan section) Acute Rehab PT Goals Patient Stated Goal: pt wants to get home so he can have his arthritis medicine PT Goal Formulation: With patient Time For Goal Achievement: 01/12/19  Potential to Achieve Goals: Good Progress towards PT goals: Progressing toward goals    Frequency    Min 3X/week      PT Plan Current plan remains appropriate       AM-PAC PT "6 Clicks" Mobility   Outcome Measure  Help needed turning from your back to your  side while in a flat bed without using bedrails?: A Lot Help needed moving from lying on your back to sitting on the side of a flat bed without using bedrails?: A Lot Help needed moving to and from a bed to a chair (including a wheelchair)?: A Little Help needed standing up from a chair using your arms (e.g., wheelchair or bedside chair)?: A Little Help needed to walk in hospital room?: A Little Help needed climbing 3-5 steps with a railing? : A Little 6 Click Score: 16    End of Session Equipment Utilized During Treatment: Gait belt Activity Tolerance: Patient tolerated treatment well Patient left: in chair;with call bell/phone within reach Nurse Communication: Mobility status PT Visit Diagnosis: Unsteadiness on feet (R26.81);Difficulty in walking, not elsewhere classified (R26.2)     Time: 8144-8185 PT Time Calculation (min) (ACUTE ONLY): 38 min  Charges:  $Gait Training: 8-22 mins $Therapeutic Activity: 8-22 mins                     Kipp Brood, PT, DPT, [facility] Physical Therapist with [facility]  01/06/2019 4:05 PM

## 2019-01-06 NOTE — Progress Notes (Signed)
Referring Physician(s): Dr. Tresa Moore  Supervising Physician: Corrie Mckusick  Patient Status:  [facility] - In-pt  Chief Complaint: Follow up left PCN placed 8/6 by Dr. Pascal Lux  Subjective:  Patient laying in bed, drinking from milk carton - he states he feels very tired and weak today, having trouble bringing his upper extremities to his mouth to eat because they feel heavy. He is able to move all extremities however it clearly requires quite a bit of effort. He states the PCN insertion site is tender, especially when he lays on it. He denies any other complaints.   Allergies: Patient has no known allergies.  Medications: Prior to Admission medications   Medication Sig Start Date End Date Taking? Authorizing Provider  cefpodoxime (VANTIN) 200 MG tablet Take 200 mg by mouth 2 (two) times daily. Take 1 tablet by mouth twice daily for ten days. Fill quantity: 20   Yes [provider]  diclofenac (VOLTAREN) 75 MG EC tablet Take 1 tablet (75 mg total) by mouth 2 (two) times daily. Patient taking differently: Take 150 mg by mouth daily.  09/12/18   Terald Sleeper, PA-C  lisinopril-hydrochlorothiazide (ZESTORETIC) 20-12.5 MG tablet Take 1 tablet by mouth daily. 11/07/18   Terald Sleeper, PA-C  oxyCODONE (OXY IR/ROXICODONE) 5 MG immediate release tablet Take 1 tablet (5 mg total) by mouth every 4 (four) hours as needed for moderate pain or severe pain. Post-Operatively 11/16/18   Alexis Frock, MD  polyethylene glycol (MIRALAX / GLYCOLAX) 17 g packet Take 17 g by mouth daily as needed for mild constipation. 12/25/18   Eugenie Filler, MD  senna-docusate (SENOKOT-S) 8.6-50 MG tablet Take 1 tablet by mouth 2 (two) times daily. 12/25/18   Eugenie Filler, MD  sulfamethoxazole-trimethoprim (BACTRIM DS) 800-160 MG tablet Take 1 tablet by mouth 2 (two) times daily. Take 1 tablet by mouth twice daily for early cellulitis    [provider]     Vital Signs: BP (!) 150/84 (BP Location: Left  Arm)    Pulse 96    Temp 99 F (37.2 C) (Oral)    Resp 18    Ht 5\' 6"  (1.676 m)    Wt 210 lb 5.1 oz (95.4 kg)    SpO2 95%    BMI 33.95 kg/m   Physical Exam Vitals signs and nursing note reviewed.  Constitutional:      General: He is not in acute distress.    Appearance: He is obese.  HENT:     Head: Normocephalic.  Cardiovascular:     Rate and Rhythm: Normal rate.  Pulmonary:     Effort: Pulmonary effort is normal.  Abdominal:     General: There is no distension.     Palpations: Abdomen is soft.     Tenderness: There is no abdominal tenderness.  Genitourinary:    Comments: (+) left PCN to gravity with ~150 cc clear, bloody urine in bag. Insertion site mildly tender to palpation - clean, dry, dressed appropriately without leakage or active bleeding.  Skin:    General: Skin is warm and dry.  Neurological:     Mental Status: He is alert. Mental status is at baseline.     Motor: Weakness present.     Imaging: Ct Abdomen Pelvis W Contrast  Result Date: 01/02/2019 CLINICAL DATA:  73 year old with fevers and sepsis. History of bladder cancer and status post radical cystoprostatectomy with ileal conduit. EXAM: CT ABDOMEN AND PELVIS WITH CONTRAST TECHNIQUE: Multidetector CT imaging of the  abdomen and pelvis was performed using the standard protocol following bolus administration of intravenous contrast. CONTRAST:  137mL OMNIPAQUE IOHEXOL 300 MG/ML  SOLN COMPARISON:  CT 12/22/2018 FINDINGS: Lower chest: Lung bases are clear.  No large pleural effusions. Hepatobiliary: Numerous calcified gallstones. No gallbladder distention and no gallbladder inflammatory changes. Normal appearance of the liver without biliary dilatation. Pancreas: Unremarkable. No pancreatic ductal dilatation or surrounding inflammatory changes. Spleen: Normal in size without focal abnormality. Adrenals/Urinary Tract: Normal adrenal glands. Normal appearance of the right kidney without hydronephrosis. History of radical  cystoprostatectomy with ileal conduit. Increased left perinephric edema with at least mild left hydroureteronephrosis. Left ureter is dilated to the region of the surgical anastomosis. Delayed excretion of contrast from the left renal collecting system. Stomach/Bowel: Normal appearance of the stomach and duodenum. Patient has a urinary diversion with an ileal conduit. No gross abnormality at the ileal conduit. Expected postoperative changes in the bowel. No evidence for focal bowel inflammation or bowel obstruction. Vascular/Lymphatic: Atherosclerotic calcifications in the aorta and iliac arteries without aneurysm. Main visceral arteries are patent. There is no significant abdominopelvic lymphadenopathy. Reproductive: Radical cystoprostatectomy. Again noted is a small amount of fluid at the surgical bed. Postoperative fluid collection measures 7.2 x 4.3 x 4.9 cm and previously measured 7.4 x 4.7 x 6.4 cm. No gas within the fluid collection. Other: Negative for free air.  Negative for ascites. Musculoskeletal: Degenerative facet disease in lower lumbar spine. Irregularity along superior endplate at L3 is stable. No acute bone abnormality. IMPRESSION: 1. Increased inflammatory changes centered around the left kidney with persistent left hydroureteronephrosis. Findings remain concerning for pyelonephritis but there is also concern for an underlying left ureter anastomotic stricture. 2. Cholelithiasis without acute inflammatory changes. 3. Postsurgical changes related to radical cystoprostatectomy. Postoperative fluid collection in the pelvis has slightly decreased in size and there is no gas within the collection. Findings are most compatible with a resolving postoperative fluid collection rather than an abscess. Electronically Signed   By: Markus Daft M.D.   On: 01/02/2019 16:46   Dg Chest Port 1 View  Result Date: 01/02/2019 CLINICAL DATA:  Fever since yesterday.  Altered mental status. EXAM: PORTABLE CHEST 1 VIEW  COMPARISON:  Radiographs 12/22/2018 and 02/25/2016. FINDINGS: 1320 hours. The heart size and mediastinal contours are normal. The lungs are clear. There is no pleural effusion or pneumothorax. No acute osseous findings are identified. Patient is status post bilateral total shoulder arthroplasty. Telemetry leads overlie the chest. IMPRESSION: Stable chest without evidence of active cardiopulmonary process. Electronically Signed   By: Richardean Sale M.D.   On: 01/02/2019 13:59   Ir Nephrostomy Placement Left  Result Date: 01/05/2019 INDICATION: History of bladder cancer, now with concern for ureteral obstruction and pelvicaliectasis potential pyelonephritis. Please perform image guided left-sided nephrostomy catheter placement for infection source control and renal preservation purposes. EXAM: 1. ULTRASOUND GUIDANCE FOR PUNCTURE OF THE LEFT RENAL COLLECTING SYSTEM 2. LEFT PERCUTANEOUS NEPHROSTOMY TUBE PLACEMENT. COMPARISON:  CT abdomen and pelvis - 01/02/2019 MEDICATIONS: Patient is currently admitted to the hospital receiving intravenous antibiotics; the antibiotic was administered in an appropriate time frame prior to skin puncture. ANESTHESIA/SEDATION: Moderate (conscious) sedation was employed during this procedure. A total of Versed 4 mg and Fentanyl 100 mcg was administered intravenously. Moderate Sedation Time: 16 minutes. The patient's level of consciousness and vital signs were monitored continuously by radiology nursing throughout the procedure under my direct supervision. CONTRAST:  20 cc Omnipaque 300 administered into the collecting system FLUOROSCOPY TIME:  3 minutes, 24 seconds (75 mGy) COMPLICATIONS: None immediate. PROCEDURE: The procedure, risks, benefits, and alternatives were explained to the patient. Questions regarding the procedure were encouraged and answered. The patient understands and consents to the procedure. A timeout was performed prior to the initiation of the procedure. The left  flank region was prepped with Betadine in a sterile fashion, and a sterile drape was applied covering the operative field. A sterile gown and sterile gloves were used for the procedure. Local anesthesia was provided with 1% Lidocaine with epinephrine. Ultrasound was used to localize the left kidney. Under direct ultrasound guidance, a 21 gauge needle was advanced into the renal collecting system. An ultrasound image documentation was performed. Access within the collecting system was confirmed with the efflux of urine followed by contrast injection. Over a Nitrex wire, the inner three [place] catheter of an Accustick set was advanced into the renal collecting system. Contrast injection was injected into the collecting system as several spot radiographs were obtained in various obliquities confirming puncture within a posterior inferior calix. As such, the tract was dilated with an Accustick stent. Over a guide wire, a 10-French percutaneous nephrostomy catheter was advanced into the collecting system where the coil was formed and locked. Contrast was injected and several sport radiographs were obtained in various obliquities confirming access. The catheter was secured at the skin with a Prolene retention suture and a gravity bag was placed. A dressing was placed. The patient tolerated procedure well without immediate postprocedural complication. FINDINGS: Ultrasound scanning demonstrates a mildly dilated left renal collecting system as was demonstrated on preceding CT scan. Under direct ultrasound guidance, a posterior inferior calix was targeted allowing advancement of an 10-French percutaneous nephrostomy catheter under intermittent fluoroscopic guidance. Contrast injection confirmed appropriate positioning. IMPRESSION: Successful ultrasound and fluoroscopic guided placement of a left sided 10 French PCN. Electronically Signed   By: Sandi Mariscal M.D.   On: 01/05/2019 18:02    Labs:  CBC: Recent Labs     01/02/19 2305 01/03/19 0223 01/04/19 0218 01/05/19 0456  WBC 18.2* 15.7* 8.6 6.2  HGB 10.7* 10.9* 10.8* 10.8*  HCT 34.0* 34.3* 35.5* 33.9*  PLT 243 239 200 231    COAGS: Recent Labs    12/22/18 1258 01/02/19 1323  INR 1.5* 1.2  APTT 46* 37*    BMP: Recent Labs    01/03/19 0223 01/04/19 0218 01/05/19 0456 01/06/19 0414  NA 135 137 134* 136  K 3.3* 3.8 3.7 3.6  CL 102 106 100 97*  CO2 23 24 26 26   GLUCOSE 137* 116* 98 112*  BUN 16 16 13 17   CALCIUM 8.8* 9.0 9.3 9.9  CREATININE 1.10 0.95 0.85 0.84  GFRNONAA >60 >60 >60 >60  GFRAA >60 >60 >60 >60    LIVER FUNCTION TESTS: Recent Labs    11/08/18 1555 12/22/18 1258 12/30/18 1133 01/02/19 1323  BILITOT 0.8 1.4* 0.4 1.1  AST 53* 54* 36 34  ALT 50* 28 21 22   ALKPHOS 46 54 76 72  PROT 7.2 7.9 7.2 8.4*  ALBUMIN 4.2 3.9 4.3 4.1    Assessment and Plan:  73 y/o M s/p left PCN placement yesterday in IR due to hydronephrosis. Insertion site unremarkable, tender to palpation which is to be expected, draining clear, bloody urine on exam today.   Patient is very weak and reports difficulty with moving all extremities due to fatigue - he denies any other complaints, no obvious CN deficit on my exam today. Tmax 100.5, hypertensive, slightly tachycardic  at 101, hgb 10.8, plt 231. Currently on cefepime per primary team.  Further plans per primary team/urology - please call IR with any questions or concerns.  Electronically Signed: Joaquim Nam, PA-C 01/06/2019, 10:09 AM   I spent a total of 15 Minutes at the the patient's bedside AND on the patient's hospital floor or unit, greater than 50% of which was counseling/coordinating care for left PCN follow up.

## 2019-01-06 NOTE — Progress Notes (Signed)
AVS given to patient and explained at the bedside with wife Vickii Chafe present. Medications and follow up appointments have been explained with pt verbalizing understanding. Before discharge pt verbalized that they were fully ready to be discharged with support from their family and friends.

## 2019-01-06 NOTE — Progress Notes (Signed)
Peripherally Inserted Central Catheter/Midline Placement  The IV Nurse has discussed with the patient and/or persons authorized to consent for the patient, the purpose of this procedure and the potential benefits and risks involved with this procedure.  The benefits include less needle sticks, lab draws from the catheter, and the patient may be discharged home with the catheter. Risks include, but not limited to, infection, bleeding, blood clot (thrombus formation), and puncture of an artery; nerve damage and irregular heartbeat and possibility to perform a PICC exchange if needed/ordered by physician.  Alternatives to this procedure were also discussed.  Bard Power PICC patient education guide, fact sheet on infection prevention and patient information card has been provided to patient /or left at bedside.    PICC/Midline Placement Documentation        [name] 01/06/2019, 3:21 PM

## 2019-01-07 DIAGNOSIS — G4733 Obstructive sleep apnea (adult) (pediatric): Secondary | ICD-10-CM | POA: Diagnosis not present

## 2019-01-07 DIAGNOSIS — H9311 Tinnitus, right ear: Secondary | ICD-10-CM | POA: Diagnosis not present

## 2019-01-07 DIAGNOSIS — M542 Cervicalgia: Secondary | ICD-10-CM | POA: Diagnosis not present

## 2019-01-07 DIAGNOSIS — K8020 Calculus of gallbladder without cholecystitis without obstruction: Secondary | ICD-10-CM | POA: Diagnosis not present

## 2019-01-07 DIAGNOSIS — M79602 Pain in left arm: Secondary | ICD-10-CM | POA: Diagnosis not present

## 2019-01-07 DIAGNOSIS — G9341 Metabolic encephalopathy: Secondary | ICD-10-CM | POA: Diagnosis not present

## 2019-01-07 DIAGNOSIS — D630 Anemia in neoplastic disease: Secondary | ICD-10-CM | POA: Diagnosis not present

## 2019-01-07 DIAGNOSIS — I119 Hypertensive heart disease without heart failure: Secondary | ICD-10-CM | POA: Diagnosis not present

## 2019-01-07 DIAGNOSIS — C679 Malignant neoplasm of bladder, unspecified: Secondary | ICD-10-CM | POA: Diagnosis not present

## 2019-01-07 DIAGNOSIS — M797 Fibromyalgia: Secondary | ICD-10-CM | POA: Diagnosis not present

## 2019-01-07 DIAGNOSIS — K5730 Diverticulosis of large intestine without perforation or abscess without bleeding: Secondary | ICD-10-CM | POA: Diagnosis not present

## 2019-01-07 DIAGNOSIS — M79601 Pain in right arm: Secondary | ICD-10-CM | POA: Diagnosis not present

## 2019-01-07 DIAGNOSIS — N136 Pyonephrosis: Secondary | ICD-10-CM | POA: Diagnosis not present

## 2019-01-07 DIAGNOSIS — I447 Left bundle-branch block, unspecified: Secondary | ICD-10-CM | POA: Diagnosis not present

## 2019-01-07 DIAGNOSIS — M19022 Primary osteoarthritis, left elbow: Secondary | ICD-10-CM | POA: Diagnosis not present

## 2019-01-07 DIAGNOSIS — J40 Bronchitis, not specified as acute or chronic: Secondary | ICD-10-CM | POA: Diagnosis not present

## 2019-01-07 DIAGNOSIS — B957 Other staphylococcus as the cause of diseases classified elsewhere: Secondary | ICD-10-CM | POA: Diagnosis not present

## 2019-01-07 DIAGNOSIS — A4152 Sepsis due to Pseudomonas: Secondary | ICD-10-CM | POA: Diagnosis not present

## 2019-01-07 DIAGNOSIS — H9190 Unspecified hearing loss, unspecified ear: Secondary | ICD-10-CM | POA: Diagnosis not present

## 2019-01-07 DIAGNOSIS — K5900 Constipation, unspecified: Secondary | ICD-10-CM | POA: Diagnosis not present

## 2019-01-07 DIAGNOSIS — M25519 Pain in unspecified shoulder: Secondary | ICD-10-CM | POA: Diagnosis not present

## 2019-01-07 DIAGNOSIS — B952 Enterococcus as the cause of diseases classified elsewhere: Secondary | ICD-10-CM | POA: Diagnosis not present

## 2019-01-07 DIAGNOSIS — M47816 Spondylosis without myelopathy or radiculopathy, lumbar region: Secondary | ICD-10-CM | POA: Diagnosis not present

## 2019-01-07 DIAGNOSIS — M19021 Primary osteoarthritis, right elbow: Secondary | ICD-10-CM | POA: Diagnosis not present

## 2019-01-07 DIAGNOSIS — I700 Atherosclerosis of aorta: Secondary | ICD-10-CM | POA: Diagnosis not present

## 2019-01-07 LAB — CULTURE, BLOOD (ROUTINE X 2)
Culture: NO GROWTH
Special Requests: ADEQUATE

## 2019-01-09 DIAGNOSIS — A419 Sepsis, unspecified organism: Secondary | ICD-10-CM | POA: Diagnosis not present

## 2019-01-09 DIAGNOSIS — A4152 Sepsis due to Pseudomonas: Secondary | ICD-10-CM | POA: Diagnosis not present

## 2019-01-09 DIAGNOSIS — B957 Other staphylococcus as the cause of diseases classified elsewhere: Secondary | ICD-10-CM | POA: Diagnosis not present

## 2019-01-09 DIAGNOSIS — D630 Anemia in neoplastic disease: Secondary | ICD-10-CM | POA: Diagnosis not present

## 2019-01-09 DIAGNOSIS — B952 Enterococcus as the cause of diseases classified elsewhere: Secondary | ICD-10-CM | POA: Diagnosis not present

## 2019-01-09 DIAGNOSIS — C679 Malignant neoplasm of bladder, unspecified: Secondary | ICD-10-CM | POA: Diagnosis not present

## 2019-01-09 DIAGNOSIS — N136 Pyonephrosis: Secondary | ICD-10-CM | POA: Diagnosis not present

## 2019-01-09 LAB — CULTURE, BLOOD (ROUTINE X 2)
Culture: NO GROWTH
Culture: NO GROWTH
Special Requests: ADEQUATE

## 2019-01-11 ENCOUNTER — Telehealth: Payer: Self-pay | Admitting: *Deleted

## 2019-01-11 DIAGNOSIS — A4152 Sepsis due to Pseudomonas: Secondary | ICD-10-CM | POA: Diagnosis not present

## 2019-01-11 DIAGNOSIS — C679 Malignant neoplasm of bladder, unspecified: Secondary | ICD-10-CM | POA: Diagnosis not present

## 2019-01-11 DIAGNOSIS — A419 Sepsis, unspecified organism: Secondary | ICD-10-CM | POA: Diagnosis not present

## 2019-01-11 DIAGNOSIS — B952 Enterococcus as the cause of diseases classified elsewhere: Secondary | ICD-10-CM | POA: Diagnosis not present

## 2019-01-11 DIAGNOSIS — D630 Anemia in neoplastic disease: Secondary | ICD-10-CM | POA: Diagnosis not present

## 2019-01-11 DIAGNOSIS — N136 Pyonephrosis: Secondary | ICD-10-CM | POA: Diagnosis not present

## 2019-01-11 DIAGNOSIS — B957 Other staphylococcus as the cause of diseases classified elsewhere: Secondary | ICD-10-CM | POA: Diagnosis not present

## 2019-01-11 NOTE — Telephone Encounter (Signed)
Left message for patient to call about labs on my desk.

## 2019-01-12 ENCOUNTER — Telehealth: Payer: Self-pay

## 2019-01-12 NOTE — Telephone Encounter (Signed)
Reviewed patient's chart and lab work and creatinine levels have increased since hospitalization and symptoms are concerning for dehydration. No evidence of heart failure noted. Recommend to continue cefepime for now and administer 1 L bolus of normal saline. Recheck CMP, CBC on Saturday 8/15. If continues may either need further evaluation in ED or will change cefepime to meropenem.

## 2019-01-12 NOTE — Telephone Encounter (Signed)
Have him stop any diclofenac (arthritis med) also. And recheck.

## 2019-01-12 NOTE — Telephone Encounter (Signed)
Luis attempted to give verbal order to [facility] case manager [employer], who stated that they are unable to provide this, cannot go over the weekend, asked if this could be done on Monday instead. RN took the call, informed [employer] of the importance of this, found out [facility] is the pharmacy providing medication and supplies.  RN gave verbal order to Coretta per Terri Piedra for 1 L bolus NS to be administered 8/14 with recheck CMP and CBC on 8/15. RN asked for confirmation from [facility], as [facility] pushed back about these orders during the initial call.  Coretta will follow up with [facility]. Landis Gandy, RN

## 2019-01-12 NOTE — Telephone Encounter (Signed)
Patient wife aware to hold diclofenac. Dr. Tammi Klippel office is handing labs.

## 2019-01-12 NOTE — Telephone Encounter (Signed)
Billy Rasmussen and his wife called office with concerns regarding Cefepime. Patient states in the last seven days he has experienced; loss of appetite, night sweats, muscle spasms/ twitches, muscle weakness,mental fogginess. Patient states this has been going on for 7 days now. Patient has reached out to PCP to see if symptoms could be related to other medication. Was advised to hold off on taking Voltaren.  Will route message to MD to advise on patient's concerns. [place]

## 2019-01-13 ENCOUNTER — Other Ambulatory Visit: Payer: Self-pay

## 2019-01-13 DIAGNOSIS — B952 Enterococcus as the cause of diseases classified elsewhere: Secondary | ICD-10-CM | POA: Diagnosis not present

## 2019-01-13 DIAGNOSIS — B957 Other staphylococcus as the cause of diseases classified elsewhere: Secondary | ICD-10-CM | POA: Diagnosis not present

## 2019-01-13 DIAGNOSIS — N136 Pyonephrosis: Secondary | ICD-10-CM | POA: Diagnosis not present

## 2019-01-13 DIAGNOSIS — D630 Anemia in neoplastic disease: Secondary | ICD-10-CM | POA: Diagnosis not present

## 2019-01-13 DIAGNOSIS — C679 Malignant neoplasm of bladder, unspecified: Secondary | ICD-10-CM | POA: Diagnosis not present

## 2019-01-13 DIAGNOSIS — A4152 Sepsis due to Pseudomonas: Secondary | ICD-10-CM | POA: Diagnosis not present

## 2019-01-13 NOTE — Telephone Encounter (Signed)
Spoke with Billy Rasmussen who is caring for patient and she advised she is able to make sure the patient gets the bolus (as long as supplies are delivered) she can not do labs on tomorrow 01/14/19 Saturday because she has no where to deliver them to, all the labs in the area are closed. Asked her to go do the bolus and I will let the provider know and see if he wants to send the patient to an urgent care or ED to have the labs done on 01/14/19. She advised she will call once the saline has been done to let us know.

## 2019-01-13 NOTE — Telephone Encounter (Signed)
Received call from [facility] and she advised she was able to give patient bolus as directed.   Paged provider and informed him fluids have been administered and per [name] I called patient to inform him that because home health can not draws labs on Saturday his labs will be redrawn on Monday 01/16/19. However if he does not feel better tomorrow he should go to nearest Urgent Care or Emergency Department. He reports that he feels worse today and can barely hold his head up to talk on the phone to me. States "I have been dehydrated before but I feel like garbage" and he does not believe the fluids will change much. Advised the patient if he feels that bad he does not have to wait until tomorrow only he can decide, as we have not seen him in the past 24 hours. His wife says she will watch him and if he starts to get worse she will take him to ED. Advised will let provider know.

## 2019-01-13 NOTE — Patient Outreach (Signed)
[facility] [facility]) Care Management  01/13/2019  Billy Rasmussen 1945-08-04 [id number]  EMMI: general discharge red alert Referral date: 01/13/19 Referral reason: lost interest in things: yes Insurance:  Medicare Day # 4  Telephone call to patient regarding EMMI general discharge red alert. HIPAA verified with patient.  RNCM explained reason for call.  Patient states, "right now I cannot hold a fork."  Patient states he is not able to hold anything.  He reports when he tries to hold a cup or fork his hand jerks and he drops it.  Patient reports he is also having some difficulty talking. He reports some of his words become slurred. Patient states he is having difficulty concentrating.  Patient states he has experienced these symptoms for the last 4 days. He reports he and his wife called the infection disease doctor, Dr. Wilder Glade and his primary MD. Patient states the doctors said he may be dehydrated.  Patient gave verbal permission to speak with his wife, [name] regarding his health information.  Spouse states patient's muscles seem to jerk when he attempts to hold something. Spouse states patient reports he is having some trouble with his eyesight.  Spouse states this has happened to patient before minus patients inability to hold things and he was told he was dehydrated.  Spouse states patient was hydrated the last time this occurred and it resolved.  Spouse states the doctor has ordered medication and the home health nurse will administer it to patient.  Spouse states patient is receiving home health care with [facility].  Spouse states she is also encouraging patient to drink fluid and the doctor discontinued patients diclofenac.   RNCM offered to follow up with patient within 2-3 days to determine if patient is improving with treatment plan.  Spouse verbally agreed.  RNCM advised spouse to call patients doctor if symptoms worsen or do not resolved. Advised to call 911 for severe symptoms.   Spouse verbalized understanding.   PLAN:  RNCM will follow up with patient within 3 business days.   Quinn Plowman RN,BSN,CCM [facility] Telephonic  [phone]

## 2019-01-16 ENCOUNTER — Other Ambulatory Visit: Payer: Self-pay

## 2019-01-16 DIAGNOSIS — B952 Enterococcus as the cause of diseases classified elsewhere: Secondary | ICD-10-CM | POA: Diagnosis not present

## 2019-01-16 DIAGNOSIS — D630 Anemia in neoplastic disease: Secondary | ICD-10-CM | POA: Diagnosis not present

## 2019-01-16 DIAGNOSIS — A4152 Sepsis due to Pseudomonas: Secondary | ICD-10-CM | POA: Diagnosis not present

## 2019-01-16 DIAGNOSIS — C679 Malignant neoplasm of bladder, unspecified: Secondary | ICD-10-CM | POA: Diagnosis not present

## 2019-01-16 DIAGNOSIS — N136 Pyonephrosis: Secondary | ICD-10-CM | POA: Diagnosis not present

## 2019-01-16 DIAGNOSIS — B957 Other staphylococcus as the cause of diseases classified elsewhere: Secondary | ICD-10-CM | POA: Diagnosis not present

## 2019-01-16 NOTE — Patient Outreach (Signed)
[place] [facility]) Care Management  01/16/2019  Billy Rasmussen [birth year] [id number]  EMMI: general discharge red alert Referral date: 01/13/19 Referral reason: lost interest in things: yes Diagnosis: Sepsis due to Pseudomonas bacteremia/pyelonephritis with left hydronephrosis:  Nephrostomy tube by IR on 01/05/2019 for left hydronephrosis Insurance:  Medicare Day # 4  Telephone call to patient regarding EMMI general follow up. HIPAA verified with patient.  Patient states he feels he is doing better.  Patient states he speech has gotten better and his ability to grip things when he holds them.  Patient reports having some arthritis in his hands/ fingers that cause his discomfort at time. Patient states the home health therapist saw him this morning and he walked a lot further today. He reports he still has his PICC line and a stent that his home health nurse has managed.   Patient reports he had sharp pain on his left side in the kidney area last night and some this morning in the same area.  Patient states he is unsure which doctor will follow up regarding the " tube" he has. Patient states he had some clots and redness from the drainage of urine but it is starting to clear up.  He reports his home health nurse is scheduled to see him at 2:00pm today.  Patient states he is scheduled to follow up with his primary MD on 01/31/19 and the urologist on 01/30/19.  RNCM reviewed discharge information with patient. RNCM advised patient to contact his urologist regarding symptoms.  RNCM advised patient to notify MD of any changes in condition prior to scheduled appointment. RNCM provided contact name and number: [phone] or main office number [phone] and 24 hour nurse advise line [phone] by mail.  RNCM verified patient aware of 911 services for urgent/ emergent needs.  PLAN; RNCM will follow up with patient within 4 business days.  RNCM will mail [facility] care management brochure/  magnet  Quinn Plowman RN,BSN,CCM [facility] Telephonic  [phone]

## 2019-01-16 NOTE — Telephone Encounter (Signed)
Ok per labs today with instructions to go to the ED if symptoms worsen pending repeat blood work results.

## 2019-01-17 ENCOUNTER — Other Ambulatory Visit: Payer: Self-pay

## 2019-01-17 NOTE — Patient Outreach (Signed)
[place] [facility]) Care Management  01/17/2019  Billy Rasmussen 23-Aug-1945 [id number]  EMMI:general discharge red alert Referral date:01/13/19 Referral reason:lost interest in things: yes Diagnosis: Sepsis due to Pseudomonas bacteremia/pyelonephritiswith left hydronephrosis:  Nephrostomy tube by IR on 01/05/2019 forleft hydronephrosis Insurance: Medicare  Telephone call to patient regarding EMMI general discharge. HIPAA verified with patient. Patient states he thinks he has the information he needs now.  Patient confirms [place] home health nurse saw him on yesterday.  He states the nurse contacted the urologist office and informed him the nephrostomy tube would stay in 2 to 6 months.  Patient confirms he has a follow up appointment with the urologist on 01/30/19 and he would follow up with this doctor if he had any problems.  Patient states the nurse assisted him with putting on the leg strap yesterday for the bag.  He states this helps to stabilize the bag. Patient reports he still gets pain occasionally in his left side near kidney but states pain is not consistent.  He reports he notices the pain more when he is moving a certain way.   Patient denies any further needs at this time.  RNCM discussed signs/ symptoms of infection. Advised patient to call the doctor for symptoms. Patient verbalized understanding.  Patient states he has his wife check the surgical area daily.   PLAN; RNCM will close case due to patient being assessed and having no further needs.   Quinn Plowman RN,BSN,CCM [facility] Telephonic  [phone]

## 2019-01-18 ENCOUNTER — Ambulatory Visit (INDEPENDENT_AMBULATORY_CARE_PROVIDER_SITE_OTHER): Payer: Medicare Other

## 2019-01-18 ENCOUNTER — Other Ambulatory Visit: Payer: Self-pay

## 2019-01-18 DIAGNOSIS — K5900 Constipation, unspecified: Secondary | ICD-10-CM

## 2019-01-18 DIAGNOSIS — R400 Somnolence: Secondary | ICD-10-CM

## 2019-01-18 DIAGNOSIS — M25519 Pain in unspecified shoulder: Secondary | ICD-10-CM

## 2019-01-18 DIAGNOSIS — A4152 Sepsis due to Pseudomonas: Secondary | ICD-10-CM

## 2019-01-18 DIAGNOSIS — E7800 Pure hypercholesterolemia, unspecified: Secondary | ICD-10-CM

## 2019-01-18 DIAGNOSIS — D630 Anemia in neoplastic disease: Secondary | ICD-10-CM

## 2019-01-18 DIAGNOSIS — G4733 Obstructive sleep apnea (adult) (pediatric): Secondary | ICD-10-CM

## 2019-01-18 DIAGNOSIS — C679 Malignant neoplasm of bladder, unspecified: Secondary | ICD-10-CM | POA: Diagnosis not present

## 2019-01-18 DIAGNOSIS — N136 Pyonephrosis: Secondary | ICD-10-CM

## 2019-01-18 DIAGNOSIS — M19022 Primary osteoarthritis, left elbow: Secondary | ICD-10-CM | POA: Diagnosis not present

## 2019-01-18 DIAGNOSIS — M79602 Pain in left arm: Secondary | ICD-10-CM

## 2019-01-18 DIAGNOSIS — K8020 Calculus of gallbladder without cholecystitis without obstruction: Secondary | ICD-10-CM

## 2019-01-18 DIAGNOSIS — M79601 Pain in right arm: Secondary | ICD-10-CM

## 2019-01-18 DIAGNOSIS — Z79891 Long term (current) use of opiate analgesic: Secondary | ICD-10-CM

## 2019-01-18 DIAGNOSIS — Z792 Long term (current) use of antibiotics: Secondary | ICD-10-CM

## 2019-01-18 DIAGNOSIS — N179 Acute kidney failure, unspecified: Secondary | ICD-10-CM

## 2019-01-18 DIAGNOSIS — M797 Fibromyalgia: Secondary | ICD-10-CM

## 2019-01-18 DIAGNOSIS — M542 Cervicalgia: Secondary | ICD-10-CM

## 2019-01-18 DIAGNOSIS — B957 Other staphylococcus as the cause of diseases classified elsewhere: Secondary | ICD-10-CM

## 2019-01-18 DIAGNOSIS — Z6833 Body mass index (BMI) 33.0-33.9, adult: Secondary | ICD-10-CM

## 2019-01-18 DIAGNOSIS — I119 Hypertensive heart disease without heart failure: Secondary | ICD-10-CM | POA: Diagnosis not present

## 2019-01-18 DIAGNOSIS — L409 Psoriasis, unspecified: Secondary | ICD-10-CM

## 2019-01-18 DIAGNOSIS — G9341 Metabolic encephalopathy: Secondary | ICD-10-CM

## 2019-01-18 DIAGNOSIS — J40 Bronchitis, not specified as acute or chronic: Secondary | ICD-10-CM | POA: Diagnosis not present

## 2019-01-18 DIAGNOSIS — E669 Obesity, unspecified: Secondary | ICD-10-CM

## 2019-01-18 DIAGNOSIS — K5730 Diverticulosis of large intestine without perforation or abscess without bleeding: Secondary | ICD-10-CM

## 2019-01-18 DIAGNOSIS — H9311 Tinnitus, right ear: Secondary | ICD-10-CM

## 2019-01-18 DIAGNOSIS — K219 Gastro-esophageal reflux disease without esophagitis: Secondary | ICD-10-CM

## 2019-01-18 DIAGNOSIS — M19021 Primary osteoarthritis, right elbow: Secondary | ICD-10-CM | POA: Diagnosis not present

## 2019-01-18 DIAGNOSIS — M47816 Spondylosis without myelopathy or radiculopathy, lumbar region: Secondary | ICD-10-CM

## 2019-01-18 DIAGNOSIS — B952 Enterococcus as the cause of diseases classified elsewhere: Secondary | ICD-10-CM | POA: Diagnosis not present

## 2019-01-18 DIAGNOSIS — R000 Tachycardia, unspecified: Secondary | ICD-10-CM

## 2019-01-18 DIAGNOSIS — R350 Frequency of micturition: Secondary | ICD-10-CM

## 2019-01-18 DIAGNOSIS — I700 Atherosclerosis of aorta: Secondary | ICD-10-CM

## 2019-01-18 DIAGNOSIS — D72825 Bandemia: Secondary | ICD-10-CM

## 2019-01-18 DIAGNOSIS — H9190 Unspecified hearing loss, unspecified ear: Secondary | ICD-10-CM

## 2019-01-18 DIAGNOSIS — I447 Left bundle-branch block, unspecified: Secondary | ICD-10-CM

## 2019-01-18 DIAGNOSIS — Z452 Encounter for adjustment and management of vascular access device: Secondary | ICD-10-CM

## 2019-01-20 ENCOUNTER — Ambulatory Visit (INDEPENDENT_AMBULATORY_CARE_PROVIDER_SITE_OTHER): Payer: Medicare Other | Admitting: Physician Assistant

## 2019-01-20 ENCOUNTER — Encounter: Payer: Self-pay | Admitting: Physician Assistant

## 2019-01-20 DIAGNOSIS — M19019 Primary osteoarthritis, unspecified shoulder: Secondary | ICD-10-CM | POA: Diagnosis not present

## 2019-01-20 DIAGNOSIS — N289 Disorder of kidney and ureter, unspecified: Secondary | ICD-10-CM | POA: Diagnosis not present

## 2019-01-20 DIAGNOSIS — N136 Pyonephrosis: Secondary | ICD-10-CM | POA: Diagnosis not present

## 2019-01-20 DIAGNOSIS — C679 Malignant neoplasm of bladder, unspecified: Secondary | ICD-10-CM | POA: Diagnosis not present

## 2019-01-20 DIAGNOSIS — B957 Other staphylococcus as the cause of diseases classified elsewhere: Secondary | ICD-10-CM | POA: Diagnosis not present

## 2019-01-20 DIAGNOSIS — B952 Enterococcus as the cause of diseases classified elsewhere: Secondary | ICD-10-CM | POA: Diagnosis not present

## 2019-01-20 DIAGNOSIS — D630 Anemia in neoplastic disease: Secondary | ICD-10-CM | POA: Diagnosis not present

## 2019-01-20 DIAGNOSIS — A4152 Sepsis due to Pseudomonas: Secondary | ICD-10-CM | POA: Diagnosis not present

## 2019-01-20 NOTE — Progress Notes (Signed)
Telephone visit  Subjective: ZO:[id number] PCP: Terald Sleeper, PA-C JWJ:[id number] Viona Gilmore Billy Rasmussen is a 73 y.o. male calls for telephone consult today. Patient provides verbal consent for consult held via phone.  Patient is identified with 2 separate identifiers.  At this time the entire area is on COVID-19 social distancing and stay home orders are in place.  Patient is of higher risk and therefore we are performing this by a virtual method.  Location of patient: home Location of provider: HOME Others present for call: wife  He has been healing well from the pyelonephritis and bladder surgery.  He has a lot of irregular symptoms of nausea, belching. But at this time TUMS are helping enough.  He had abnormal renal tests about 4 weeks ago.  We asked him to stop the diclofenac on 01/11/19. The labs are improved even 3 weeks ago. But now his arthritis is much worse in the shoulders, hands, ribs.  He cannot open any bottles.  He hurts all over. We restart diclofenac.  And then in 1 to 2 weeks he will come for lab work.  He does have other labs that need to be performed so they can all be done at the same time.  He is still under the care of his urologist and oncologist.  He still has a PICC line in place because of the interim.  Home health is still coming out.   ROS: Per HPI  No Known Allergies Past Medical History:  Diagnosis Date  . Arthritis    knees, shoulders, elbows  . Bladder cancer [facility])    urologist-  dr Junious Silk  . Diverticulosis of colon   . Fibromyalgia   . GERD (gastroesophageal reflux disease)   . History of blood transfusion    age 55   . History of bronchitis   . History of diverticulitis of colon   . History of gastric ulcer    due to aleve  . Hypertension   . Incomplete left bundle branch block    told had irregular heartbeat 12-28-18-19, no referral to cardiology made  . Lower urinary tract symptoms (LUTS)   . OSA (obstructive sleep apnea)    NON-COMPLIANT  CPAP   --- BUT PT USES OXYGEN AT NIGHT 2.5L VIA [place] (PT'S DECISION)  . PONV (postoperative nausea and vomiting)   . Psoriasis    occ outbreaks on eyebrows chin and head, none recent  . Tinnitus    right ear more, has tranmitter in right ear removable at hs  . Urinary frequency   . Wears glasses   . Wears partial dentures     Current Outpatient Medications:  .  amLODipine (NORVASC) 10 MG tablet, Take 1 tablet (10 mg total) by mouth daily., Disp: 90 tablet, Rfl: 0 .  diclofenac (VOLTAREN) 75 MG EC tablet, Take 1 tablet (75 mg total) by mouth 2 (two) times daily. (Patient taking differently: Take 150 mg by mouth daily. ), Disp: 180 tablet, Rfl: 0 .  lisinopril-hydrochlorothiazide (ZESTORETIC) 20-12.5 MG tablet, Take 1 tablet by mouth daily., Disp: 90 tablet, Rfl: 1 .  oxyCODONE (OXY IR/ROXICODONE) 5 MG immediate release tablet, Take 1 tablet (5 mg total) by mouth every 4 (four) hours as needed for moderate pain or severe pain. Post-Operatively, Disp: 20 tablet, Rfl: 0 .  polyethylene glycol (MIRALAX / GLYCOLAX) 17 g packet, Take 17 g by mouth daily as needed for mild constipation., Disp: 14 each, Rfl: 0 .  senna-docusate (SENOKOT-S) 8.6-50 MG tablet, Take 1  tablet by mouth 2 (two) times daily., Disp:  , Rfl:   Assessment/ Plan: 73 y.o. male   1. Abnormal renal function - CMP14+EGFR; Future  2. Shoulder arthritis Can restart diclofenac 75 mg BID.  Recheck the labs in 2 weeks   No follow-ups on file.  Continue all other maintenance medications as listed above.  Start time: 9:27 AM End time: 9: 39 AM  No orders of the defined types were placed in this encounter.   Particia Nearing PA-C [place] [phone]

## 2019-01-23 DIAGNOSIS — C679 Malignant neoplasm of bladder, unspecified: Secondary | ICD-10-CM | POA: Diagnosis not present

## 2019-01-23 DIAGNOSIS — B952 Enterococcus as the cause of diseases classified elsewhere: Secondary | ICD-10-CM | POA: Diagnosis not present

## 2019-01-23 DIAGNOSIS — B957 Other staphylococcus as the cause of diseases classified elsewhere: Secondary | ICD-10-CM | POA: Diagnosis not present

## 2019-01-23 DIAGNOSIS — N136 Pyonephrosis: Secondary | ICD-10-CM | POA: Diagnosis not present

## 2019-01-23 DIAGNOSIS — D630 Anemia in neoplastic disease: Secondary | ICD-10-CM | POA: Diagnosis not present

## 2019-01-23 DIAGNOSIS — A4152 Sepsis due to Pseudomonas: Secondary | ICD-10-CM | POA: Diagnosis not present

## 2019-01-24 DIAGNOSIS — C679 Malignant neoplasm of bladder, unspecified: Secondary | ICD-10-CM | POA: Diagnosis not present

## 2019-01-24 DIAGNOSIS — B957 Other staphylococcus as the cause of diseases classified elsewhere: Secondary | ICD-10-CM | POA: Diagnosis not present

## 2019-01-24 DIAGNOSIS — N136 Pyonephrosis: Secondary | ICD-10-CM | POA: Diagnosis not present

## 2019-01-24 DIAGNOSIS — A4152 Sepsis due to Pseudomonas: Secondary | ICD-10-CM | POA: Diagnosis not present

## 2019-01-24 DIAGNOSIS — D630 Anemia in neoplastic disease: Secondary | ICD-10-CM | POA: Diagnosis not present

## 2019-01-24 DIAGNOSIS — B952 Enterococcus as the cause of diseases classified elsewhere: Secondary | ICD-10-CM | POA: Diagnosis not present

## 2019-01-26 DIAGNOSIS — C679 Malignant neoplasm of bladder, unspecified: Secondary | ICD-10-CM | POA: Diagnosis not present

## 2019-01-26 DIAGNOSIS — B952 Enterococcus as the cause of diseases classified elsewhere: Secondary | ICD-10-CM | POA: Diagnosis not present

## 2019-01-26 DIAGNOSIS — B957 Other staphylococcus as the cause of diseases classified elsewhere: Secondary | ICD-10-CM | POA: Diagnosis not present

## 2019-01-26 DIAGNOSIS — D630 Anemia in neoplastic disease: Secondary | ICD-10-CM | POA: Diagnosis not present

## 2019-01-26 DIAGNOSIS — A4152 Sepsis due to Pseudomonas: Secondary | ICD-10-CM | POA: Diagnosis not present

## 2019-01-26 DIAGNOSIS — N136 Pyonephrosis: Secondary | ICD-10-CM | POA: Diagnosis not present

## 2019-01-30 DIAGNOSIS — B952 Enterococcus as the cause of diseases classified elsewhere: Secondary | ICD-10-CM | POA: Diagnosis not present

## 2019-01-30 DIAGNOSIS — C679 Malignant neoplasm of bladder, unspecified: Secondary | ICD-10-CM | POA: Diagnosis not present

## 2019-01-30 DIAGNOSIS — D630 Anemia in neoplastic disease: Secondary | ICD-10-CM | POA: Diagnosis not present

## 2019-01-30 DIAGNOSIS — B957 Other staphylococcus as the cause of diseases classified elsewhere: Secondary | ICD-10-CM | POA: Diagnosis not present

## 2019-01-30 DIAGNOSIS — N136 Pyonephrosis: Secondary | ICD-10-CM | POA: Diagnosis not present

## 2019-01-30 DIAGNOSIS — A4152 Sepsis due to Pseudomonas: Secondary | ICD-10-CM | POA: Diagnosis not present

## 2019-01-31 ENCOUNTER — Other Ambulatory Visit: Payer: Self-pay | Admitting: Urology

## 2019-02-01 DIAGNOSIS — B952 Enterococcus as the cause of diseases classified elsewhere: Secondary | ICD-10-CM | POA: Diagnosis not present

## 2019-02-01 DIAGNOSIS — B957 Other staphylococcus as the cause of diseases classified elsewhere: Secondary | ICD-10-CM | POA: Diagnosis not present

## 2019-02-01 DIAGNOSIS — D630 Anemia in neoplastic disease: Secondary | ICD-10-CM | POA: Diagnosis not present

## 2019-02-01 DIAGNOSIS — C679 Malignant neoplasm of bladder, unspecified: Secondary | ICD-10-CM | POA: Diagnosis not present

## 2019-02-01 DIAGNOSIS — A4152 Sepsis due to Pseudomonas: Secondary | ICD-10-CM | POA: Diagnosis not present

## 2019-02-01 DIAGNOSIS — N136 Pyonephrosis: Secondary | ICD-10-CM | POA: Diagnosis not present

## 2019-02-06 DIAGNOSIS — K5900 Constipation, unspecified: Secondary | ICD-10-CM | POA: Diagnosis not present

## 2019-02-06 DIAGNOSIS — J40 Bronchitis, not specified as acute or chronic: Secondary | ICD-10-CM | POA: Diagnosis not present

## 2019-02-06 DIAGNOSIS — D630 Anemia in neoplastic disease: Secondary | ICD-10-CM | POA: Diagnosis not present

## 2019-02-06 DIAGNOSIS — H9311 Tinnitus, right ear: Secondary | ICD-10-CM | POA: Diagnosis not present

## 2019-02-06 DIAGNOSIS — M797 Fibromyalgia: Secondary | ICD-10-CM | POA: Diagnosis not present

## 2019-02-06 DIAGNOSIS — M19021 Primary osteoarthritis, right elbow: Secondary | ICD-10-CM | POA: Diagnosis not present

## 2019-02-06 DIAGNOSIS — M79601 Pain in right arm: Secondary | ICD-10-CM | POA: Diagnosis not present

## 2019-02-06 DIAGNOSIS — I700 Atherosclerosis of aorta: Secondary | ICD-10-CM | POA: Diagnosis not present

## 2019-02-06 DIAGNOSIS — B952 Enterococcus as the cause of diseases classified elsewhere: Secondary | ICD-10-CM | POA: Diagnosis not present

## 2019-02-06 DIAGNOSIS — M19022 Primary osteoarthritis, left elbow: Secondary | ICD-10-CM | POA: Diagnosis not present

## 2019-02-06 DIAGNOSIS — K8020 Calculus of gallbladder without cholecystitis without obstruction: Secondary | ICD-10-CM | POA: Diagnosis not present

## 2019-02-06 DIAGNOSIS — G4733 Obstructive sleep apnea (adult) (pediatric): Secondary | ICD-10-CM | POA: Diagnosis not present

## 2019-02-06 DIAGNOSIS — H9190 Unspecified hearing loss, unspecified ear: Secondary | ICD-10-CM | POA: Diagnosis not present

## 2019-02-06 DIAGNOSIS — B957 Other staphylococcus as the cause of diseases classified elsewhere: Secondary | ICD-10-CM | POA: Diagnosis not present

## 2019-02-06 DIAGNOSIS — I119 Hypertensive heart disease without heart failure: Secondary | ICD-10-CM | POA: Diagnosis not present

## 2019-02-06 DIAGNOSIS — C679 Malignant neoplasm of bladder, unspecified: Secondary | ICD-10-CM | POA: Diagnosis not present

## 2019-02-06 DIAGNOSIS — M542 Cervicalgia: Secondary | ICD-10-CM | POA: Diagnosis not present

## 2019-02-06 DIAGNOSIS — M79602 Pain in left arm: Secondary | ICD-10-CM | POA: Diagnosis not present

## 2019-02-06 DIAGNOSIS — M25519 Pain in unspecified shoulder: Secondary | ICD-10-CM | POA: Diagnosis not present

## 2019-02-06 DIAGNOSIS — A4152 Sepsis due to Pseudomonas: Secondary | ICD-10-CM | POA: Diagnosis not present

## 2019-02-06 DIAGNOSIS — M47816 Spondylosis without myelopathy or radiculopathy, lumbar region: Secondary | ICD-10-CM | POA: Diagnosis not present

## 2019-02-06 DIAGNOSIS — I447 Left bundle-branch block, unspecified: Secondary | ICD-10-CM | POA: Diagnosis not present

## 2019-02-06 DIAGNOSIS — N136 Pyonephrosis: Secondary | ICD-10-CM | POA: Diagnosis not present

## 2019-02-06 DIAGNOSIS — K5730 Diverticulosis of large intestine without perforation or abscess without bleeding: Secondary | ICD-10-CM | POA: Diagnosis not present

## 2019-02-06 DIAGNOSIS — G9341 Metabolic encephalopathy: Secondary | ICD-10-CM | POA: Diagnosis not present

## 2019-02-07 DIAGNOSIS — D630 Anemia in neoplastic disease: Secondary | ICD-10-CM | POA: Diagnosis not present

## 2019-02-07 DIAGNOSIS — B957 Other staphylococcus as the cause of diseases classified elsewhere: Secondary | ICD-10-CM | POA: Diagnosis not present

## 2019-02-07 DIAGNOSIS — C679 Malignant neoplasm of bladder, unspecified: Secondary | ICD-10-CM | POA: Diagnosis not present

## 2019-02-07 DIAGNOSIS — N136 Pyonephrosis: Secondary | ICD-10-CM | POA: Diagnosis not present

## 2019-02-07 DIAGNOSIS — A4152 Sepsis due to Pseudomonas: Secondary | ICD-10-CM | POA: Diagnosis not present

## 2019-02-07 DIAGNOSIS — B952 Enterococcus as the cause of diseases classified elsewhere: Secondary | ICD-10-CM | POA: Diagnosis not present

## 2019-02-08 ENCOUNTER — Ambulatory Visit (INDEPENDENT_AMBULATORY_CARE_PROVIDER_SITE_OTHER): Payer: Medicare Other

## 2019-02-08 ENCOUNTER — Other Ambulatory Visit: Payer: Self-pay | Admitting: Physician Assistant

## 2019-02-08 ENCOUNTER — Other Ambulatory Visit: Payer: Self-pay

## 2019-02-08 DIAGNOSIS — Z23 Encounter for immunization: Secondary | ICD-10-CM

## 2019-02-08 NOTE — Patient Instructions (Addendum)
DUE TO COVID-19 ONLY ONE VISITOR IS ALLOWED TO COME WITH YOU AND STAY IN THE WAITING ROOM ONLY DURING PRE OP AND PROCEDURE DAY OF SURGERY. THE 1 VISITOR MAY VISIT WITH YOU AFTER SURGERY IN YOUR PRIVATE ROOM DURING VISITING HOURS ONLY!  YOU NEED TO HAVE A COVID 19 TEST ON_9/15______ @__10 :30_____, THIS TEST MUST BE DONE BEFORE SURGERY, COME  [address] [place] , [id number].  ([place]) ONCE YOUR COVID TEST IS COMPLETED, PLEASE BEGIN THE QUARANTINE INSTRUCTIONS AS OUTLINED IN YOUR HANDOUT.                Billy Rasmussen   Your procedure is scheduled on: 02/17/19   Report to [facility] Main  Entrance   Report to admitting at   7:00 AM     Call this number if you have problems the morning of surgery [phone]    Remember: Do not eat food or drink liquids :After Midnight . BRUSH YOUR TEETH MORNING OF SURGERY AND RINSE YOUR MOUTH OUT, NO CHEWING GUM CANDY OR MINTS.     Take these medicines the morning of surgery with A SIP OF WATER: Voltaren,Amlodipine                                 You may not have any metal on your body including piercings              Do not wear jewelry,  lotions, powders or deodorant                         Men may shave face and neck.   Do not bring valuables to the hospital. [place].   Contacts, dentures or bridgework ay be brought to your room.may not be worn into surgery .   Patients discharged the day of surgery will not be allowed to drive home.  IF YOU ARE HAVING SURGERY AND GOING HOME THE SAME DAY, YOU MUST HAVE AN ADULT TO DRIVE YOU HOME AND BE WITH YOU FOR 24 HOURS.  YOU MAY GO HOME BY TAXI OR UBER OR ORTHERWISE, BUT AN ADULT MUST ACCOMPANY YOU HOME AND STAY WITH YOU FOR 24 HOURS.  Name and phone number of your driver:  Special Instructions: N/A              Please read over the following fact sheets you were  given: _____________________________________________________________________             [facility] - Preparing for Surgery  Before surgery, you can play an important role.   Because skin is not sterile, your skin needs to be as free of germs as possible.   You can reduce the number of germs on your skin by washing with CHG (chlorahexidine gluconate) soap before surgery.   CHG is an antiseptic cleaner which kills germs and bonds with the skin to continue killing germs even after washing. Please DO NOT use if you have an allergy to CHG or antibacterial soaps.  If your skin becomes reddened/irritated stop using the CHG and inform your nurse when you arrive at Short Stay.   You may shave your face/neck. Please follow these instructions carefully:  1.  Shower with CHG Soap the night before surgery and the  morning of  Surgery.  2.  If you choose to wash your hair, wash your hair first as usual with your  normal  shampoo.  3.  After you shampoo, rinse your hair and body thoroughly to remove the  shampoo.                                        4.  Use CHG as you would any other liquid soap.  You can apply chg directly  to the skin and wash                       Gently with a scrungie or clean washcloth.  5.  Apply the CHG Soap to your body ONLY FROM THE NECK DOWN.   Do not use on face/ open                           Wound or open sores. Avoid contact with eyes, ears mouth and genitals (private parts).                       Wash face,  Genitals (private parts) with your normal soap.             6.  Wash thoroughly, paying special attention to the area where your surgery  will be performed.  7.  Thoroughly rinse your body with warm water from the neck down.  8.  DO NOT shower/wash with your normal soap after using and rinsing off  the CHG Soap.                9.  Pat yourself dry with a clean towel.            10.  Wear clean pajamas.            11.  Place clean sheets on your bed the night of  your first shower and do not  sleep with pets. Day of Surgery : Do not apply any lotions/deodorants the morning of surgery.  Please wear clean clothes to the hospital/surgery center.  FAILURE TO FOLLOW THESE INSTRUCTIONS MAY RESULT IN THE CANCELLATION OF YOUR SURGERY PATIENT SIGNATURE_________________________________  NURSE SIGNATURE__________________________________  ________________________________________________________________________

## 2019-02-09 ENCOUNTER — Encounter (HOSPITAL_COMMUNITY): Payer: Self-pay

## 2019-02-09 ENCOUNTER — Encounter (HOSPITAL_COMMUNITY)
Admission: RE | Admit: 2019-02-09 | Discharge: 2019-02-09 | Disposition: A | Discharge status: Home / Self Care | Payer: Medicare Other | Source: Ambulatory Visit | Attending: General Surgery | Admitting: General Surgery

## 2019-02-09 ENCOUNTER — Other Ambulatory Visit: Payer: Self-pay

## 2019-02-09 DIAGNOSIS — N1330 Unspecified hydronephrosis: Secondary | ICD-10-CM | POA: Insufficient documentation

## 2019-02-09 DIAGNOSIS — Z01812 Encounter for preprocedural laboratory examination: Secondary | ICD-10-CM | POA: Diagnosis not present

## 2019-02-09 DIAGNOSIS — Z906 Acquired absence of other parts of urinary tract: Secondary | ICD-10-CM | POA: Insufficient documentation

## 2019-02-09 LAB — BASIC METABOLIC PANEL
Anion gap: 11 (ref 5–15)
BUN: 17 mg/dL (ref 8–23)
CO2: 25 mmol/L (ref 22–32)
Calcium: 9.2 mg/dL (ref 8.9–10.3)
Chloride: 103 mmol/L (ref 98–111)
Creatinine, Ser: 0.92 mg/dL (ref 0.61–1.24)
GFR calc Af Amer: >60 mL/min (ref >60)
GFR calc non Af Amer: >60 mL/min (ref >60)
Glucose, Bld: 90 mg/dL (ref 70–99)
Potassium: 4 mmol/L (ref 3.5–5.1)
Sodium: 139 mmol/L (ref 135–145)

## 2019-02-09 LAB — CBC
HCT: 34.9 % — ABNORMAL LOW (ref 39.0–52.0)
Hemoglobin: 10.9 g/dL — ABNORMAL LOW (ref 13.0–17.0)
MCH: 27.7 pg (ref 26.0–34.0)
MCHC: 31.2 g/dL (ref 30.0–36.0)
MCV: 88.6 fL (ref 80.0–100.0)
Platelets: 157 10*3/uL (ref 150–400)
RBC: 3.94 MIL/uL — ABNORMAL LOW (ref 4.22–5.81)
RDW: 16.2 % — ABNORMAL HIGH (ref 11.5–15.5)
WBC: 6.5 10*3/uL (ref 4.0–10.5)
nRBC: 0 % (ref 0.0–0.2)

## 2019-02-09 NOTE — Progress Notes (Signed)
PCP - Particia Nearing Cardiologist - none  Chest x-ray - 01/06/19 EKG - 01/04/19 Stress Test - no ECHO - no Cardiac Cath - no  Sleep Study - yes. Uses home O2 at night because he can't tolerate the mask CPAP - no  Fasting Blood Sugar - NA Checks Blood Sugar _____ times a day  Blood Thinner Instructions:NA Aspirin Instructions: Last Dose:  Anesthesia review:   Patient denies shortness of breath, fever, cough and chest pain at PAT appointment yes  Patient verbalized understanding of instructions that were given to them at the PAT appointment. Patient was also instructed that they will need to review over the PAT instructions again at home before surgery. yes

## 2019-02-13 DIAGNOSIS — D630 Anemia in neoplastic disease: Secondary | ICD-10-CM | POA: Diagnosis not present

## 2019-02-13 DIAGNOSIS — C679 Malignant neoplasm of bladder, unspecified: Secondary | ICD-10-CM | POA: Diagnosis not present

## 2019-02-13 DIAGNOSIS — B957 Other staphylococcus as the cause of diseases classified elsewhere: Secondary | ICD-10-CM | POA: Diagnosis not present

## 2019-02-13 DIAGNOSIS — N136 Pyonephrosis: Secondary | ICD-10-CM | POA: Diagnosis not present

## 2019-02-13 DIAGNOSIS — B952 Enterococcus as the cause of diseases classified elsewhere: Secondary | ICD-10-CM | POA: Diagnosis not present

## 2019-02-13 DIAGNOSIS — A4152 Sepsis due to Pseudomonas: Secondary | ICD-10-CM | POA: Diagnosis not present

## 2019-02-14 ENCOUNTER — Other Ambulatory Visit (HOSPITAL_COMMUNITY)
Admission: RE | Admit: 2019-02-14 | Discharge: 2019-02-14 | Disposition: A | Discharge status: Home / Self Care | Payer: Medicare Other | Source: Ambulatory Visit | Attending: Urology | Admitting: Urology

## 2019-02-14 DIAGNOSIS — Z01812 Encounter for preprocedural laboratory examination: Secondary | ICD-10-CM | POA: Insufficient documentation

## 2019-02-14 DIAGNOSIS — Z20828 Contact with and (suspected) exposure to other viral communicable diseases: Secondary | ICD-10-CM | POA: Insufficient documentation

## 2019-02-15 DIAGNOSIS — D630 Anemia in neoplastic disease: Secondary | ICD-10-CM | POA: Diagnosis not present

## 2019-02-15 DIAGNOSIS — B952 Enterococcus as the cause of diseases classified elsewhere: Secondary | ICD-10-CM | POA: Diagnosis not present

## 2019-02-15 DIAGNOSIS — A4152 Sepsis due to Pseudomonas: Secondary | ICD-10-CM | POA: Diagnosis not present

## 2019-02-15 DIAGNOSIS — B957 Other staphylococcus as the cause of diseases classified elsewhere: Secondary | ICD-10-CM | POA: Diagnosis not present

## 2019-02-15 DIAGNOSIS — C679 Malignant neoplasm of bladder, unspecified: Secondary | ICD-10-CM | POA: Diagnosis not present

## 2019-02-15 DIAGNOSIS — N136 Pyonephrosis: Secondary | ICD-10-CM | POA: Diagnosis not present

## 2019-02-15 LAB — NOVEL CORONAVIRUS, NAA (HOSP ORDER, SEND-OUT TO REF LAB; TAT 18-24 HRS): SARS-CoV-2, NAA: NOT DETECTED

## 2019-02-16 ENCOUNTER — Other Ambulatory Visit: Payer: Self-pay | Admitting: Urology

## 2019-02-16 ENCOUNTER — Other Ambulatory Visit: Payer: Self-pay

## 2019-02-16 ENCOUNTER — Encounter (HOSPITAL_BASED_OUTPATIENT_CLINIC_OR_DEPARTMENT_OTHER): Payer: Self-pay | Admitting: *Deleted

## 2019-02-16 MED ORDER — GENTAMICIN SULFATE 40 MG/ML IJ SOLN
480.0000 mg | INTRAVENOUS | Status: AC
  Administered 2019-02-17: 09:00:00 480 mg via INTRAVENOUS
  Filled 2019-02-16 (×2): qty 12

## 2019-02-16 NOTE — Progress Notes (Signed)
Spoke w/ pt via phone for pre-op interview.  Npo after mn.  Arrive at 0700.  Current lab results and ekg in epic.  Pt will take colace am dos w/ sips of water.  Per pt on Tuesday 02-14-2019 this week pt started a fever at 101, he started his Cipro as directed by dr Tresa Moore 3 days prior to surgery. Wednesday evening fever, 101.8.  This morning temp 98.2 without tylenol, which he has been taking.  This afternoon pt stated temp 99.1.  Per pt he had called dr Tresa Moore office and spoke w/ [facility] triage nurse and received return call that per dr Tresa Moore continue taking tylenol.   I spoke w/ Foster Simpson, dr Tresa Moore or scheduler, about fever. Selita stated per dr Tresa Moore have pt some for surgery and will assess pt in pre-op.

## 2019-02-16 NOTE — Anesthesia Preprocedure Evaluation (Addendum)
Anesthesia Evaluation  Patient identified by MRN, date of birth, ID band Patient awake    Reviewed: Allergy & Precautions, NPO status , Patient's Chart, lab work & pertinent test results  History of Anesthesia Complications (+) PONV and history of anesthetic complications  Airway Mallampati: II  TM Distance: >3 FB Neck ROM: Full    Dental no notable dental hx.    Pulmonary sleep apnea and Oxygen sleep apnea , former smoker,  Noncompliant with CPAP Pt's own decision to use O2 2.5LPM via [place] at night  Quit smoking 1999, 80 pack year history   Pulmonary exam normal breath sounds clear to auscultation       Cardiovascular hypertension, Normal cardiovascular exam Rhythm:Regular Rate:Normal  Does not have cardiologist   Neuro/Psych tinnitus negative psych ROS   GI/Hepatic Neg liver ROS, PUD, GERD  ,diverticulosis   Endo/Other  negative endocrine ROS  Renal/GU negative Renal ROS Bladder dysfunction  Bladder cancer    Musculoskeletal  (+) Arthritis , Osteoarthritis,  Fibromyalgia -Pain in knees, shoulders, elbows   Abdominal   Peds negative pediatric ROS (+)  Hematology  (+) anemia ,   Anesthesia Other Findings   Reproductive/Obstetrics negative OB ROS                            Anesthesia Physical  Anesthesia Plan  ASA: III  Anesthesia Plan: General   Post-op Pain Management:    Induction: Intravenous  PONV Risk Score and Plan: 3 and Ondansetron, Dexamethasone, Treatment may vary due to age or medical condition and Scopolamine patch - Pre-op  Airway Management Planned: LMA  Additional Equipment: None  Intra-op Plan:   Post-operative Plan: Extubation in OR  Informed Consent: I have reviewed the patients History and Physical, chart, labs and discussed the procedure including the risks, benefits and alternatives for the proposed anesthesia with the patient or authorized  representative who has indicated his/her understanding and acceptance.     Dental advisory given  Plan Discussed with: CRNA  Anesthesia Plan Comments:        Anesthesia Quick Evaluation

## 2019-02-17 ENCOUNTER — Inpatient Hospital Stay (HOSPITAL_BASED_OUTPATIENT_CLINIC_OR_DEPARTMENT_OTHER)
Admission: RE | Admit: 2019-02-17 | Discharge: 2019-02-20 | DRG: 659 | Disposition: A | Discharge status: Home Health | Payer: Medicare Other | Attending: Urology | Admitting: Urology

## 2019-02-17 ENCOUNTER — Ambulatory Visit (HOSPITAL_BASED_OUTPATIENT_CLINIC_OR_DEPARTMENT_OTHER): Payer: Medicare Other | Admitting: Anesthesiology

## 2019-02-17 ENCOUNTER — Ambulatory Visit (HOSPITAL_BASED_OUTPATIENT_CLINIC_OR_DEPARTMENT_OTHER): Payer: Medicare Other | Admitting: Physician Assistant

## 2019-02-17 ENCOUNTER — Ambulatory Visit (HOSPITAL_COMMUNITY): Payer: Medicare Other

## 2019-02-17 ENCOUNTER — Encounter (HOSPITAL_BASED_OUTPATIENT_CLINIC_OR_DEPARTMENT_OTHER): Payer: Self-pay

## 2019-02-17 ENCOUNTER — Encounter (HOSPITAL_COMMUNITY)
Admission: RE | Disposition: A | Discharge status: Home / Self Care | Payer: Self-pay | Source: Home / Self Care | Attending: Urology

## 2019-02-17 DIAGNOSIS — D649 Anemia, unspecified: Secondary | ICD-10-CM | POA: Diagnosis not present

## 2019-02-17 DIAGNOSIS — Z9119 Patient's noncompliance with other medical treatment and regimen: Secondary | ICD-10-CM

## 2019-02-17 DIAGNOSIS — Z466 Encounter for fitting and adjustment of urinary device: Secondary | ICD-10-CM | POA: Diagnosis not present

## 2019-02-17 DIAGNOSIS — N12 Tubulo-interstitial nephritis, not specified as acute or chronic: Secondary | ICD-10-CM | POA: Diagnosis present

## 2019-02-17 DIAGNOSIS — Z8249 Family history of ischemic heart disease and other diseases of the circulatory system: Secondary | ICD-10-CM

## 2019-02-17 DIAGNOSIS — T83192A Other mechanical complication of urinary stent, initial encounter: Secondary | ICD-10-CM | POA: Diagnosis present

## 2019-02-17 DIAGNOSIS — Z8711 Personal history of peptic ulcer disease: Secondary | ICD-10-CM | POA: Diagnosis not present

## 2019-02-17 DIAGNOSIS — Z87891 Personal history of nicotine dependence: Secondary | ICD-10-CM

## 2019-02-17 DIAGNOSIS — G4733 Obstructive sleep apnea (adult) (pediatric): Secondary | ICD-10-CM | POA: Diagnosis present

## 2019-02-17 DIAGNOSIS — Z20828 Contact with and (suspected) exposure to other viral communicable diseases: Secondary | ICD-10-CM | POA: Diagnosis present

## 2019-02-17 DIAGNOSIS — Z8551 Personal history of malignant neoplasm of bladder: Secondary | ICD-10-CM | POA: Diagnosis not present

## 2019-02-17 DIAGNOSIS — N131 Hydronephrosis with ureteral stricture, not elsewhere classified: Secondary | ICD-10-CM | POA: Diagnosis not present

## 2019-02-17 DIAGNOSIS — I10 Essential (primary) hypertension: Secondary | ICD-10-CM | POA: Diagnosis present

## 2019-02-17 DIAGNOSIS — N1330 Unspecified hydronephrosis: Secondary | ICD-10-CM | POA: Diagnosis present

## 2019-02-17 DIAGNOSIS — M797 Fibromyalgia: Secondary | ICD-10-CM | POA: Diagnosis present

## 2019-02-17 DIAGNOSIS — N390 Urinary tract infection, site not specified: Secondary | ICD-10-CM | POA: Diagnosis present

## 2019-02-17 DIAGNOSIS — Z818 Family history of other mental and behavioral disorders: Secondary | ICD-10-CM | POA: Diagnosis not present

## 2019-02-17 DIAGNOSIS — A419 Sepsis, unspecified organism: Secondary | ICD-10-CM | POA: Diagnosis present

## 2019-02-17 DIAGNOSIS — K219 Gastro-esophageal reflux disease without esophagitis: Secondary | ICD-10-CM | POA: Diagnosis present

## 2019-02-17 DIAGNOSIS — Z972 Presence of dental prosthetic device (complete) (partial): Secondary | ICD-10-CM | POA: Diagnosis not present

## 2019-02-17 DIAGNOSIS — Z973 Presence of spectacles and contact lenses: Secondary | ICD-10-CM

## 2019-02-17 DIAGNOSIS — Z8719 Personal history of other diseases of the digestive system: Secondary | ICD-10-CM | POA: Diagnosis not present

## 2019-02-17 HISTORY — PX: CYSTOSCOPY/URETEROSCOPY/HOLMIUM LASER: SHX6545

## 2019-02-17 LAB — MRSA PCR SCREENING: MRSA by PCR: NEGATIVE

## 2019-02-17 SURGERY — CYSTOURETEROSCOPY, USING HOLMIUM LASER
Anesthesia: General | Site: Ureter | Laterality: Left

## 2019-02-17 MED ORDER — ONDANSETRON HCL 4 MG/2ML IJ SOLN
INTRAMUSCULAR | Status: AC
  Filled 2019-02-17: qty 2

## 2019-02-17 MED ORDER — ONDANSETRON HCL 4 MG/2ML IJ SOLN
INTRAMUSCULAR | Status: DC | PRN
  Administered 2019-02-17: 4 mg via INTRAVENOUS

## 2019-02-17 MED ORDER — SCOPOLAMINE 1 MG/3DAYS TD PT72
1.0000 | MEDICATED_PATCH | TRANSDERMAL | Status: DC
  Filled 2019-02-17: qty 1

## 2019-02-17 MED ORDER — LACTATED RINGERS IV SOLN
INTRAVENOUS | Status: DC
  Administered 2019-02-17: 08:00:00 50 mL/h via INTRAVENOUS
  Administered 2019-02-17: 10:00:00 via INTRAVENOUS
  Filled 2019-02-17: qty 1000

## 2019-02-17 MED ORDER — ACETAMINOPHEN 500 MG PO TABS: 1000.0000 mg | ORAL_TABLET | Freq: Three times a day (TID) | ORAL | Status: DC

## 2019-02-17 MED ORDER — SUGAMMADEX SODIUM 200 MG/2ML IV SOLN
INTRAVENOUS | Status: DC | PRN
  Administered 2019-02-17: 200 mg via INTRAVENOUS
  Administered 2019-02-17: 50 mg via INTRAVENOUS

## 2019-02-17 MED ORDER — FENTANYL CITRATE (PF) 100 MCG/2ML IJ SOLN
INTRAMUSCULAR | Status: AC
  Filled 2019-02-17: qty 2

## 2019-02-17 MED ORDER — SODIUM CHLORIDE 0.9 % IV SOLN
INTRAVENOUS | Status: DC
  Administered 2019-02-17 – 2019-02-18 (×3): via INTRAVENOUS
  Filled 2019-02-17: qty 1000

## 2019-02-17 MED ORDER — LIDOCAINE 2% (20 MG/ML) 5 ML SYRINGE
INTRAMUSCULAR | Status: AC
  Filled 2019-02-17: qty 5

## 2019-02-17 MED ORDER — SENNOSIDES-DOCUSATE SODIUM 8.6-50 MG PO TABS
1.0000 | ORAL_TABLET | Freq: Two times a day (BID) | ORAL | Status: DC
  Administered 2019-02-17 – 2019-02-19 (×5): 1 via ORAL
  Filled 2019-02-17 (×7): qty 1

## 2019-02-17 MED ORDER — SCOPOLAMINE 1 MG/3DAYS TD PT72
MEDICATED_PATCH | TRANSDERMAL | Status: AC
  Filled 2019-02-17: qty 1

## 2019-02-17 MED ORDER — CIPROFLOXACIN HCL 500 MG PO TABS: 500.0000 mg | ORAL_TABLET | Freq: Two times a day (BID) | ORAL | 0 refills | Status: DC

## 2019-02-17 MED ORDER — SCOPOLAMINE 1 MG/3DAYS TD PT72
1.0000 | MEDICATED_PATCH | TRANSDERMAL | Status: DC
  Administered 2019-02-17: 08:00:00 1.5 mg via TRANSDERMAL
  Filled 2019-02-17: qty 1

## 2019-02-17 MED ORDER — FENTANYL CITRATE (PF) 100 MCG/2ML IJ SOLN
25.0000 ug | INTRAMUSCULAR | Status: DC | PRN
  Administered 2019-02-17: 0.25 ug via INTRAVENOUS
  Administered 2019-02-17: 10:00:00 25 ug via INTRAVENOUS
  Administered 2019-02-17: 12:00:00 50 ug via INTRAVENOUS
  Filled 2019-02-17: qty 1

## 2019-02-17 MED ORDER — DEXAMETHASONE SODIUM PHOSPHATE 4 MG/ML IJ SOLN
INTRAMUSCULAR | Status: DC | PRN
  Administered 2019-02-17: 10 mg via INTRAVENOUS

## 2019-02-17 MED ORDER — VANCOMYCIN HCL 10 G IV SOLR
2000.0000 mg | Freq: Once | INTRAVENOUS | Status: AC
  Administered 2019-02-17: 2000 mg via INTRAVENOUS
  Filled 2019-02-17 (×2): qty 2000

## 2019-02-17 MED ORDER — OXYCODONE HCL 5 MG PO TABS
5.0000 mg | ORAL_TABLET | Freq: Once | ORAL | Status: DC | PRN
  Filled 2019-02-17: qty 1

## 2019-02-17 MED ORDER — OXYCODONE HCL 5 MG/5ML PO SOLN
5.0000 mg | Freq: Once | ORAL | Status: DC | PRN
  Filled 2019-02-17: qty 5

## 2019-02-17 MED ORDER — ROCURONIUM BROMIDE 10 MG/ML (PF) SYRINGE
PREFILLED_SYRINGE | INTRAVENOUS | Status: AC
  Filled 2019-02-17: qty 10

## 2019-02-17 MED ORDER — PROMETHAZINE HCL 25 MG/ML IJ SOLN
6.2500 mg | INTRAMUSCULAR | Status: DC | PRN
  Filled 2019-02-17: qty 1

## 2019-02-17 MED ORDER — FENTANYL CITRATE (PF) 100 MCG/2ML IJ SOLN
INTRAMUSCULAR | Status: DC | PRN
  Administered 2019-02-17 (×2): 50 ug via INTRAVENOUS
  Administered 2019-02-17 (×4): 25 ug via INTRAVENOUS
  Administered 2019-02-17 (×2): 50 ug via INTRAVENOUS

## 2019-02-17 MED ORDER — SODIUM CHLORIDE 0.9 % IV SOLN
INTRAVENOUS | Status: DC | PRN
  Administered 2019-02-17: 80 mL

## 2019-02-17 MED ORDER — VANCOMYCIN HCL 10 G IV SOLR
1500.0000 mg | INTRAVENOUS | Status: DC
  Filled 2019-02-17: qty 1500

## 2019-02-17 MED ORDER — ACETAMINOPHEN 500 MG PO TABS
1000.0000 mg | ORAL_TABLET | Freq: Three times a day (TID) | ORAL | Status: DC
  Administered 2019-02-17 – 2019-02-20 (×8): 1000 mg via ORAL
  Filled 2019-02-17 (×8): qty 2

## 2019-02-17 MED ORDER — PROPOFOL 10 MG/ML IV BOLUS
INTRAVENOUS | Status: AC
  Filled 2019-02-17: qty 40

## 2019-02-17 MED ORDER — PIPERACILLIN-TAZOBACTAM 3.375 G IVPB
3.3750 g | Freq: Three times a day (TID) | INTRAVENOUS | Status: DC
  Administered 2019-02-17: 16:00:00 3.375 g via INTRAVENOUS
  Filled 2019-02-17 (×3): qty 50

## 2019-02-17 MED ORDER — TRAMADOL HCL 50 MG PO TABS: 50.0000 mg | ORAL_TABLET | Freq: Four times a day (QID) | ORAL | 0 refills | Status: DC | PRN

## 2019-02-17 MED ORDER — CHLORHEXIDINE GLUCONATE CLOTH 2 % EX PADS
6.0000 | MEDICATED_PAD | Freq: Every day | CUTANEOUS | Status: DC
  Administered 2019-02-17 – 2019-02-19 (×3): 6 via TOPICAL

## 2019-02-17 MED ORDER — DEXAMETHASONE SODIUM PHOSPHATE 10 MG/ML IJ SOLN
INTRAMUSCULAR | Status: AC
  Filled 2019-02-17: qty 1

## 2019-02-17 MED ORDER — PROPOFOL 10 MG/ML IV BOLUS
INTRAVENOUS | Status: DC | PRN
  Administered 2019-02-17 (×2): 50 mg via INTRAVENOUS
  Administered 2019-02-17: 150 mg via INTRAVENOUS

## 2019-02-17 MED ORDER — PHENOL 1.4 % MT LIQD
1.0000 | OROMUCOSAL | Status: DC | PRN
  Administered 2019-02-18: 06:00:00 1 via OROMUCOSAL
  Filled 2019-02-17: qty 177

## 2019-02-17 MED ORDER — ACETAMINOPHEN 500 MG PO TABS
1000.0000 mg | ORAL_TABLET | Freq: Once | ORAL | Status: AC
  Administered 2019-02-17: 08:00:00 1000 mg via ORAL
  Filled 2019-02-17: qty 2

## 2019-02-17 MED ORDER — OXYCODONE HCL 5 MG PO TABS
5.0000 mg | ORAL_TABLET | ORAL | Status: DC | PRN
  Administered 2019-02-18 – 2019-02-19 (×5): 5 mg via ORAL
  Filled 2019-02-17 (×5): qty 1

## 2019-02-17 MED ORDER — ROCURONIUM BROMIDE 100 MG/10ML IV SOLN
INTRAVENOUS | Status: DC | PRN
  Administered 2019-02-17: 50 mg via INTRAVENOUS
  Administered 2019-02-17: 10 mg via INTRAVENOUS

## 2019-02-17 MED ORDER — LIDOCAINE HCL (CARDIAC) PF 100 MG/5ML IV SOSY
PREFILLED_SYRINGE | INTRAVENOUS | Status: DC | PRN
  Administered 2019-02-17: 60 mg via INTRAVENOUS

## 2019-02-17 MED ORDER — PIPERACILLIN-TAZOBACTAM 3.375 G IVPB
3.3750 g | Freq: Three times a day (TID) | INTRAVENOUS | Status: DC
  Administered 2019-02-17 – 2019-02-20 (×8): 3.375 g via INTRAVENOUS
  Filled 2019-02-17 (×8): qty 50

## 2019-02-17 MED ORDER — ACETAMINOPHEN 500 MG PO TABS
ORAL_TABLET | ORAL | Status: AC
  Filled 2019-02-17: qty 2

## 2019-02-17 MED ORDER — HYDROMORPHONE HCL 1 MG/ML IJ SOLN: 0.5000 mg | INTRAMUSCULAR | Status: DC | PRN

## 2019-02-17 SURGICAL SUPPLY — 34 items
BAG DRAIN URO-CYSTO SKYTR STRL (DRAIN) ×3 IMPLANT
BASKET LASER NITINOL 1.9FR (BASKET) IMPLANT
CATH INTERMIT  6FR 70CM (CATHETERS) ×3 IMPLANT
CATH UROLOGY TORQUE 40 (MISCELLANEOUS) ×3 IMPLANT
CLOTH BEACON ORANGE TIMEOUT ST (SAFETY) ×3 IMPLANT
DRSG TEGADERM 4X4.75 (GAUZE/BANDAGES/DRESSINGS) ×3 IMPLANT
FIBER LASER FLEXIVA 365 (UROLOGICAL SUPPLIES) IMPLANT
FIBER LASER TRAC TIP (UROLOGICAL SUPPLIES) IMPLANT
GAUZE SPONGE 4X4 12PLY STRL LF (GAUZE/BANDAGES/DRESSINGS) ×3 IMPLANT
GLOVE BIO SURGEON STRL SZ7.5 (GLOVE) ×3 IMPLANT
GOWN STRL REUS W/TWL LRG LVL3 (GOWN DISPOSABLE) ×3 IMPLANT
GUIDEWIRE ANG ZIPWIRE 038X150 (WIRE) ×6 IMPLANT
GUIDEWIRE STR DUAL SENSOR (WIRE) ×3 IMPLANT
GUIDEWIRE SUPER STIFF (WIRE) ×3 IMPLANT
IV NS 1000ML (IV SOLUTION) ×2
IV NS 1000ML BAXH (IV SOLUTION) ×1 IMPLANT
IV NS IRRIG 3000ML ARTHROMATIC (IV SOLUTION) ×6 IMPLANT
KIT BALLN UROMAX 15FX4 (MISCELLANEOUS) ×1 IMPLANT
KIT BALLN UROMAX 26 75X4 (MISCELLANEOUS) ×2
KIT TURNOVER CYSTO (KITS) ×3 IMPLANT
MANIFOLD NEPTUNE II (INSTRUMENTS) ×3 IMPLANT
NS IRRIG 500ML POUR BTL (IV SOLUTION) ×6 IMPLANT
PACK CYSTO (CUSTOM PROCEDURE TRAY) ×6 IMPLANT
SHEATH URETERAL 12FRX28CM (UROLOGICAL SUPPLIES) ×3 IMPLANT
SUT ETHILON 3 0 PS 1 (SUTURE) ×3 IMPLANT
SUT VIC AB 2-0 SH 27 (SUTURE) ×2
SUT VIC AB 2-0 SH 27XBRD (SUTURE) ×1 IMPLANT
SYR 10ML LL (SYRINGE) ×3 IMPLANT
SYR 50ML LL SCALE MARK (SYRINGE) ×3 IMPLANT
SYSTEM UROSTOMY GENTLE TOUCH (WOUND CARE) ×3 IMPLANT
TUBE CONNECTING 12'X1/4 (SUCTIONS)
TUBE CONNECTING 12X1/4 (SUCTIONS) IMPLANT
TUBE FEEDING 8FR 16IN STR KANG (MISCELLANEOUS) IMPLANT
TUBING UROLOGY SET (TUBING) ×3 IMPLANT

## 2019-02-17 NOTE — Anesthesia Procedure Notes (Signed)
Procedure Name: Intubation Date/Time: 02/17/2019 8:59 AM Performed by: Justice Rocher, CRNA Pre-anesthesia Checklist: Patient identified, Emergency Drugs available, Suction available and Patient being monitored Patient Re-evaluated:Patient Re-evaluated prior to induction Oxygen Delivery Method: Circle system utilized Preoxygenation: Pre-oxygenation with 100% oxygen Induction Type: IV induction Ventilation: Mask ventilation without difficulty Laryngoscope Size: Mac and 4 Grade View: Grade II Tube type: Oral Tube size: 8.0 mm Number of attempts: 1 Airway Equipment and Method: Stylet and Oral airway Placement Confirmation: ETT inserted through vocal cords under direct vision,  positive ETCO2 and breath sounds checked- equal and bilateral Secured at: 22 cm Tube secured with: Tape Dental Injury: Teeth and Oropharynx as per pre-operative assessment

## 2019-02-17 NOTE — H&P (Signed)
Billy Rasmussen is an 73 y.o. male.    Chief Complaint: Pre-op LEFT antegrade Ureteroscopy  HPI:   1 - Recurrent High Grade Bladder Cancer - s/p robotic cystoprostatectomy with ICG sentinal + template lymphadenctomy, extensive adhesions, and conduit diversion 10/2018 for [id number] cancer with NEGATIVE margins. BCG refractory disease prior. Urethra remains in site.   Post-cystectomy Course:  - none yet   2 -Pyelonephritis - treated 11/2018 and again 12/2018 for suspect pyelo on eval fevers and malaise. UCX 12/2018 pseudomonas pan-sensitive (cipro, gent)   3 - Mild Left Hydronephrosis - mild left hydro on ER CT 12/2018 after cystoprostatectomy. Pyelo at time and therefore left neph tube placed, unclear if obstructive. Cr <1.   PMH sig for obesity, partial colectomy / colostomy / colostomy take down for diverticulitis mid 1990s (pre-cystectomy) , bilateral total knee, bilateral shoulder surgery, lipoma removal x several. His PCP is Billy Nearing PA.   Today " Billy Rasmussen " is seen to proceed with left antegrade ureteroscopy to rule out obstruction. He is on CX-specific ABX. C19 negative.     Past Medical History:  Diagnosis Date  . Arthritis    knees, shoulders, elbows  . Bladder cancer [facility])    urologist-  dr Junious Silk  . Diverticulosis of colon   . Fibromyalgia   . GERD (gastroesophageal reflux disease)   . History of diverticulitis of colon   . History of gastric ulcer    due to aleve  . Hypertension   . Lower urinary tract symptoms (LUTS)   . OSA (obstructive sleep apnea)    NON-COMPLIANT  CPAP  --- BUT PT USES OXYGEN AT NIGHT 2.5L VIA [place] (PT'S DECISION)  . PONV (postoperative nausea and vomiting)   . Psoriasis   . Tinnitus    right ear more, has tranmitter in right ear removable at hs  . Wears glasses   . Wears partial dentures     Past Surgical History:  Procedure Laterality Date  . APPENDECTOMY    . COLECTOMY W/ COLOSTOMY  1996   W/   APPENDECTOMY  . COLONOSCOPY N/A  05/11/2018   Procedure: COLONOSCOPY;  Surgeon: Daneil Dolin, MD;  [facility];  Service: Endoscopy;  Laterality: N/A;  9:30  . COLOSTOMY TAKEDOWN  1996  . CYSTOSCOPY WITH BIOPSY N/A 12/05/2013   Procedure: CYSTO BLADDER BIOPSY AND FULGERATION;  Surgeon: Festus Aloe, MD;  Location: [facility];  Service: Urology;  Laterality: N/A;  . CYSTOSCOPY WITH BIOPSY Bilateral 11/13/2014   Procedure: CYSTOSCOPY WITH  BLADDER BIOPSY FULGERATION AND BILATERAL RETROGRADE PYELOGRAMS;  Surgeon: Festus Aloe, MD;  Location: [facility];  Service: Urology;  Laterality: Bilateral;  . CYSTOSCOPY WITH FULGERATION N/A 01/18/2018   Procedure: CYSTOSCOPY WITH FULGERATION/ BLADDER BIOPSY;  Surgeon: Festus Aloe, MD;  Location: [facility];  Service: Urology;  Laterality: N/A;  . CYSTOSCOPY WITH INJECTION N/A 11/09/2018   Procedure: CYSTOSCOPY WITH INJECTION OF INDOCYANINE GREEN DYE;  Surgeon: Alexis Frock, MD;  Location: [facility];  Service: Urology;  Laterality: N/A;  . CYSTOSCOPY WITH INSERTION OF UROLIFT N/A 01/18/2018   Procedure: CYSTOSCOPY WITH INSERTION OF UROLIFT;  Surgeon: Festus Aloe, MD;  Location: [facility];  Service: Urology;  Laterality: N/A;  . EXCISION RIGHT UPPER ARM LIPOMA  2005  . HEMORROIDECTOMY    . INGUINAL HERNIA REPAIR Left 1984  . IR NEPHROSTOMY PLACEMENT LEFT  01/05/2019  . KNEE ARTHROSCOPY Left X3  LAST ONE  2002  . ORIF  LEFT HUMEROUS FX  1976  . POLYPECTOMY  05/11/2018   Procedure: POLYPECTOMY;  Surgeon: Daneil Dolin, MD;  [facility];  Service: Endoscopy;;  . PROSTATE SURGERY    . TONSILLECTOMY  AS CHILD  . TOTAL KNEE ARTHROPLASTY Left 2006   REVISION 2007  (AFTER I & D WITH ANTIBIOTIC SPACER PROCEDURE FOR STEPH INFECTION)  . TOTAL KNEE ARTHROPLASTY Right 03/30/2016   Procedure: RIGHT TOTAL KNEE ARTHROPLASTY;  Surgeon: Paralee Cancel, MD;  Location: [facility];  Service: Orthopedics;   Laterality: Right;  . TOTAL SHOULDER ARTHROPLASTY Right 04/06/2013   Procedure: RIGHT TOTAL SHOULDER ARTHROPLASTY;  Surgeon: Marin Shutter, MD;  Location: [place];  Service: Orthopedics;  Laterality: Right;  . TOTAL SHOULDER ARTHROPLASTY Left 07/20/2013   Procedure: LEFT TOTAL SHOULDER ARTHROPLASTY;  Surgeon: Marin Shutter, MD;  Location: [place];  Service: Orthopedics;  Laterality: Left;  . TRANSURETHRAL RESECTION OF BLADDER TUMOR N/A 11/12/2015   Procedure: TRANSURETHRAL RESECTION OF BLADDER TUMOR (TURBT);  Surgeon: Festus Aloe, MD;  Location: [facility];  Service: Urology;  Laterality: N/A;  . TRANSURETHRAL RESECTION OF BLADDER TUMOR WITH GYRUS (TURBT-GYRUS) N/A 12/05/2013   Procedure: TRANSURETHRAL RESECTION OF BLADDER TUMOR WITH GYRUS (TURBT-GYRUS);  Surgeon: Festus Aloe, MD;  Location: [facility];  Service: Urology;  Laterality: N/A;    Family History  Problem Relation Age of Onset  . Alzheimer's disease Mother   . Heart attack Father   . Heart disease Father   . Irregular heart beat Brother        DEFIB.  . Congestive Heart Failure Brother   . Diabetes Daughter    Social History:  reports that he quit smoking about 21 years ago. His smoking use included cigarettes. He has a 80.00 pack-year smoking history. He has never used smokeless tobacco. He reports that he does not drink alcohol or use drugs.  Allergies: No Known Allergies  No medications prior to admission.    No results found for this or any previous visit (from the past 48 hour(s)). No results found.  Review of Systems  Constitutional: Positive for malaise/fatigue.  All other systems reviewed and are negative.   Height 5\' 6"  (1.676 m), weight 95.2 kg. Physical Exam  Constitutional: He appears well-developed.  HENT:  Head: Normocephalic.  Eyes: Pupils are equal, round, and reactive to light.  Neck: Normal range of motion.  Cardiovascular: Normal rate.  Respiratory: Effort  normal.  GI:  Stable moderate obesity. RLQ Urostomy pink / patent of non-foul urine.   Genitourinary:    Genitourinary Comments: Left neph tube in place with non-foul urine. No site reactions.    Musculoskeletal: Normal range of motion.  Neurological: He is alert.  Skin: Skin is warm.     Assessment/Plan  Proceed as planned with LEFT antegrade ureteroscopy / possible dilation and stent placement with goal of ruling out obstruction and treating if appears amenable to endoscopic management.   Alexis Frock, MD 02/17/2019, 7:03 AM

## 2019-02-17 NOTE — Brief Op Note (Signed)
02/17/2019  9:59 AM  PATIENT:  Billy Rasmussen  73 y.o. male  PRE-OPERATIVE DIAGNOSIS:  LEFT HYDRONEPHROSIS, HISTORY OF CYSTECTOMY  POST-OPERATIVE DIAGNOSIS:  LEFT HYDRONEPHROSIS, HISTORY OF CYSTECTOMY  PROCEDURE:  Procedure(s) with comments: URETEROSCOPY WITH BALLOON DILATION  AND NEPHROSTOGRAM (Left) - 1 HR  SURGEON:  Surgeon(s) and Role:    * Alexis Frock, MD - Primary  PHYSICIAN ASSISTANT:   ASSISTANTS: none   ANESTHESIA:   general  EBL:  minimal   BLOOD ADMINISTERED:none  DRAINS: RLQ Urostomy to gravity; Left nephroureteral stent (capped)   LOCAL MEDICATIONS USED:  NONE  SPECIMEN:  No Specimen  DISPOSITION OF SPECIMEN:  N/A  COUNTS:  YES  TOURNIQUET:  * No tourniquets in log *  DICTATION: .Other Dictation: Dictation Number  [id number]  PLAN OF CARE: Discharge to home after PACU  PATIENT DISPOSITION:  PACU - hemodynamically stable.   Delay start of Pharmacological VTE agent (>24hrs) due to surgical blood loss or risk of bleeding: not applicable

## 2019-02-17 NOTE — Discharge Instructions (Signed)
1 - You may have bloody urine on / off with stent in place. This is normal.  2 - Call MD or go to ER for fever >102, severe pain / nausea / vomiting not relieved by medications, or acute change in medical status   Post Anesthesia Home Care Instructions  Activity: Get plenty of rest for the remainder of the day. A responsible adult should stay with you for 24 hours following the procedure.  For the next 24 hours, DO NOT: -Drive a car -Paediatric nurse -Drink alcoholic beverages -Take any medication unless instructed by your physician -Make any legal decisions or sign important papers.  Meals: Start with liquid foods such as gelatin or soup. Progress to regular foods as tolerated. Avoid greasy, spicy, heavy foods. If nausea and/or vomiting occur, drink only clear liquids until the nausea and/or vomiting subsides. Call your physician if vomiting continues.  Special Instructions/Symptoms: Your throat may feel dry or sore from the anesthesia or the breathing tube placed in your throat during surgery. If this causes discomfort, gargle with warm salt water. The discomfort should disappear within 24 hours.  If you had a scopolamine patch placed behind your ear for the management of post- operative nausea and/or vomiting:  1. The medication in the patch is effective for 72 hours, after which it should be removed.  Wrap patch in a tissue and discard in the trash. Wash hands thoroughly with soap and water. 2. You may remove the patch earlier than 72 hours if you experience unpleasant side effects which may include dry mouth, dizziness or visual disturbances. 3. Avoid touching the patch. Wash your hands with soap and water after contact with the patch.

## 2019-02-17 NOTE — Progress Notes (Signed)
S: 1 - Fevers / Suspect Urosepsis After Ureteroscopy - fevers to 103 with rigors 1 hour after antegrade ureteroscopy / stricture dilation. Most recent prior UCX pseudomonas pan-sensitive and was on Cipro pre-op and received extended interval gent peri-op.   O: NAD, malaise, no rigors at present. SBP 160s, HR 120s, T 102 current. Non-labored breathing on minimal NCO2 Abd soft, RLQ urostomy with non-foul urine. Left flank nephroureteral stent capped, no hematomas NO LE edema / swelling  A/P: Admit to stepdown. [name], IVF, Vanc+Zosyn. Low threshold for critical care consult if BP wanes. Pt and familiy updated. Goals will be afebrile x 24 hours before discharge.

## 2019-02-17 NOTE — Anesthesia Postprocedure Evaluation (Signed)
Anesthesia Post Note  Patient: Billy Rasmussen  Procedure(s) Performed: URETEROSCOPY WITH BALLOON DILATION  AND NEPHROSTOGRAM (Left Ureter)     Patient location during evaluation: PACU Anesthesia Type: General Level of consciousness: awake and alert Pain management: pain level controlled Vital Signs Assessment: post-procedure vital signs reviewed and stable Respiratory status: spontaneous breathing, nonlabored ventilation, respiratory function stable and patient connected to nasal cannula oxygen Cardiovascular status: blood pressure returned to baseline and tachycardic Postop Assessment: no apparent nausea or vomiting Anesthetic complications: no    Last Vitals:  Vitals:   02/17/19 1445 02/17/19 1500  BP: 126/80 127/78  Pulse: (!) 119 (!) 111  Resp: (!) 22 (!) 22  Temp:    SpO2: 95% 97%    Last Pain:  Vitals:   02/17/19 1500  TempSrc:   PainSc: 2                  Leannah Guse COKER

## 2019-02-17 NOTE — Transfer of Care (Signed)
Immediate Anesthesia Transfer of Care Note  Patient: Billy Rasmussen  Procedure(s) Performed: Procedure(s) (LRB): URETEROSCOPY WITH BALLOON DILATION  AND NEPHROSTOGRAM (Left)  Patient Location: PACU  Anesthesia Type: General  Level of Consciousness: awake, sedated, patient cooperative and responds to stimulation, pt shivering, blankets applied, Dr Tresa Moore at bedside - Pt awake   Airway & Oxygen Therapy: Patient Spontanous Breathing and Patient connected to face mask oxygen  Post-op Assessment: Report given to PACU RN, Post -op Vital signs reviewed and stable and Patient moving all extremities  Post vital signs: Reviewed and stable  Complications: No apparent anesthesia complications

## 2019-02-17 NOTE — Progress Notes (Signed)
Pharmacy Antibiotic Note  Billy Rasmussen is a 73 y.o. male admitted on 02/17/2019 with sepsis.  He is s/p ureteroscopy today with concern for urosepsis.  He has hx Pseudomonas UTI in 12/2018.  Pharmacy has been consulted for Vancomycin dosing.  Also starting Zosyn per MD.  02/17/2019:  Tm 103F  Scr 0.92 pre-op; est CrCl ~ 29ml/min  Plan: Zosyn per MD Vancomycin 2gm IV x1 then 1500mg  IV q24h (goal AUC 400-550) Monitor renal function and cx data  Daily Scr  Height: 5\' 6"  (167.6 cm) Weight: 202 lb 9 oz (91.9 kg) IBW/kg (Calculated) : 63.8  Temp (24hrs), Avg:100.6 F (38.1 C), Min:98 F (36.7 C), Max:103 F (39.4 C)  No results for input(s): WBC, CREATININE, LATICACIDVEN, VANCOTROUGH, VANCOPEAK, VANCORANDOM, GENTTROUGH, GENTPEAK, GENTRANDOM, TOBRATROUGH, TOBRAPEAK, TOBRARND, AMIKACINPEAK, AMIKACINTROU, AMIKACIN in the last 168 hours.  Estimated Creatinine Clearance: 78.1 mL/min (by C-G formula based on SCr of 0.92 mg/dL).    No Known Allergies  Antimicrobials this admission: 9/18 Gent x1 pre-op 9/18 Zosyn >>  9/18 Vanc >>   Dose adjustments this admission:  Microbiology results: 9/15 COVID: negative 9/18 BCx:  9/18 UCx:    01/02/19 UCx: Pseudomonas aeruginosa- pansensitive  Thank you for allowing pharmacy to be a part of this patient's care.  Netta Cedars, PharmD, BCPS 02/17/2019 1:32 PM

## 2019-02-18 LAB — BASIC METABOLIC PANEL
Anion gap: 10 (ref 5–15)
BUN: 19 mg/dL (ref 8–23)
CO2: 25 mmol/L (ref 22–32)
Calcium: 9.5 mg/dL (ref 8.9–10.3)
Chloride: 102 mmol/L (ref 98–111)
Creatinine, Ser: 1.36 mg/dL — ABNORMAL HIGH (ref 0.61–1.24)
GFR calc Af Amer: >60 mL/min (ref >60)
GFR calc non Af Amer: 52 mL/min — ABNORMAL LOW (ref >60)
Glucose, Bld: 138 mg/dL — ABNORMAL HIGH (ref 70–99)
Potassium: 5.1 mmol/L (ref 3.5–5.1)
Sodium: 137 mmol/L (ref 135–145)

## 2019-02-18 LAB — CBC
HCT: 33.4 % — ABNORMAL LOW (ref 39.0–52.0)
Hemoglobin: 10.4 g/dL — ABNORMAL LOW (ref 13.0–17.0)
MCH: 27.9 pg (ref 26.0–34.0)
MCHC: 31.1 g/dL (ref 30.0–36.0)
MCV: 89.5 fL (ref 80.0–100.0)
Platelets: 237 10*3/uL (ref 150–400)
RBC: 3.73 MIL/uL — ABNORMAL LOW (ref 4.22–5.81)
RDW: 16.9 % — ABNORMAL HIGH (ref 11.5–15.5)
WBC: 22 10*3/uL — ABNORMAL HIGH (ref 4.0–10.5)
nRBC: 0 % (ref 0.0–0.2)

## 2019-02-18 LAB — LACTIC ACID, PLASMA: Lactic Acid, Venous: 1.8 mmol/L (ref 0.5–1.9)

## 2019-02-18 LAB — URINE CULTURE: Culture: <10000 — AB

## 2019-02-18 MED ORDER — VANCOMYCIN HCL IN DEXTROSE 1-5 GM/200ML-% IV SOLN
1000.0000 mg | INTRAVENOUS | Status: DC
  Administered 2019-02-18: 1000 mg via INTRAVENOUS
  Filled 2019-02-18: qty 200

## 2019-02-18 NOTE — Progress Notes (Signed)
Pharmacy Antibiotic Note  Billy Rasmussen is a 73 y.o. male with hx recurrent bladder cancer, pyelonephritis, and PsA UTI in 12/2018, admitted on 02/17/2019 for ureteroscopy for left hydronephritis.  He's now s/p left ureteroscopy with balloon dilation of stricture and exchange of left nephroureteral stent.  Vancomycin and zosyn started post-op for suspected urosepsis.  Today, 02/18/2019: - Tmax 103 - wbc elevated at 22 - scr 1.36 (was 0.92 on 9/10), crcl~53) - all cultures have been negative thus far   Plan: - adjust vancomycin dose to 1000 mg IV q24h for est AUC 521 - continue zosyn 3.375 gm IV q8h (infuse over 4 hrs) - f/u renal function closely, monitor clinical status  ______________________________________  Height: 5\' 6"  (167.6 cm) Weight: 202 lb 9 oz (91.9 kg) IBW/kg (Calculated) : 63.8  Temp (24hrs), Avg:100 F (37.8 C), Min:96.8 F (36 C), Max:103 F (39.4 C)  Recent Labs  Lab 02/18/19 0202  WBC 22.0*  CREATININE 1.36*    Estimated Creatinine Clearance: 52.8 mL/min (A) (by C-G formula based on SCr of 1.36 mg/dL (H)).    No Known Allergies  Antimicrobials this admission: 9/18 Gent x1 pre-op 9/18 Zosyn >>  9/18 Vanc >>    Microbiology results: 9/15 COVID: negative 9/18 BCx x2:  9/18 UCx:   9/18 MRSA PCR: neg   01/02/19 UCx: Pseudomonas aeruginosa- pansensitive  Thank you for allowing pharmacy to be a part of this patient's care.  Lynelle Doctor 02/18/2019 8:56 AM

## 2019-02-18 NOTE — Progress Notes (Signed)
S: 1 - Fevers / Suspect Urosepsis After Ureteroscopy - Fevers to 103 with rigors one hour after antegrade ureteroscopy / stricture dilation. Most recent prior UCX pseudomonas pan-sensitive and was on Cipro pre-op and received extended interval gent peri-op.   9/19: Patient improved overnight with resolution of fevers and tachycardia.  Had one episode of hypotension of 78/36 but this improved to systolic blood pressure 0000000 to 120s without intervention.  Urine output excellent.  WBC 22 Creatinine 1.36 Lactate 1.8 Urine culture (9/18) insignificant growth Blood cultures pending  O: Vitals:   02/18/19 1200 02/18/19 1323  BP:  114/63  Pulse:  74  Resp:  18  Temp: 98 F (36.7 C)   SpO2:  99%   NAD Non-labored breathing on minimal NCO2 Abd soft, RLQ urostomy with non-foul urine. Left flank nephroureteral stent capped with dressing in place NO LE edema / swelling  A/P: Patient is improving on empiric antibiotics.  Reassuring that most recent urine culture demonstrates no significant growth; last fever at 1600 on 9/18.  We will continue to monitor until all vital signs stable for over 24 hours.  Follow-up blood cultures.  Likely discharge home tomorrow if continues to do well, will continue antibiotics based off of prior cultures upon discharge.  Okay for transfer to floor status today.

## 2019-02-18 NOTE — Op Note (Signed)
NAME: Billy Rasmussen, Billy Rasmussen. MEDICAL RECORD [id number] ACCOUNT [id number] DATE OF BIRTH:17-Oct-1945 FACILITY: [facility] LOCATION: [facility] PHYSICIAN:Cecille Mcclusky, MD  OPERATIVE REPORT  DATE OF PROCEDURE:  02/17/2019  PREOPERATIVE DIAGNOSIS:  Left hydronephrosis, history of pyelonephritis and cystectomy.  PROCEDURE: 1.  Left antegrade nephrostogram with interpretation. 2.  Left ureteroscopy with balloon dilation of stricture. 3.  Exchange of left nephroureteral stent.  ESTIMATED BLOOD LOSS:  Nil.  MEDICATIONS:  None.  SPECIMENS:  None.  FINDINGS: 1.  Approximately a 3-4 cm segment left distal ureteral stricture approximately, 3-French predilation, 15-French post-dilation. 2.  No evidence of anastomotic stricture. 3.  Successful balloon dilation of left distal ureter.  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  He underwent curative intent cystoprostatectomy approximately 3 months ago and did well initially perioperatively.  He unfortunately  then developed infectious parameters consistent with pyelonephritis and was noted to have worsening left hydronephrosis down to the distal ureter.  This was concerning for possible obstruction from a stricture.  He underwent nephrostomy tube drainage,  cleared his infectious parameters and now presents for endoscopic exam today to rule out obstruction and possibly treat.  He has been on antibiotics according to most recent cultures.  Informed consent was then placed in medical record.  DESCRIPTION OF PROCEDURE:  The patient was identified.  The procedure being left ureteroscopy with possible balloon dilation was confirmed.  Procedure timeout was performed.  Intravenous antibiotics administered.  General endotracheal anesthesia induced.   The patient was placed into a prone position employing prone view, axillary rolls, chest rolls, sequential compression devices, padding of his  knees, ankles and elbows.  His ostomy appliance was connected to gravity drainage and a Foley bag extension.   The patient is in situ left nephrostomy tube was capped and a sterile field was created by prepping and draping this area with chlorhexidine gluconate.  A percutaneous drape was applied.  Initial attention was directed at the antegrade nephrostogram.  Initial antegrade nephrostogram revealed excellent opacification of the left kidney and ureter where there was noted to be relative narrowing at the distal ureter just across the midline, likely corresponding to the area of mesenteric tunneling  underneath the colon.  The ZIPwire was advanced via the nephrostomy tube, which was then removed and then guided via the KMP catheter to the level of the ureter and the ZIPwire easily advanced all the way to the level of the conduit and exchanged for a  Super Stiff wire.  Over the Super Stiff wire, a short length ureteral access sheath was carefully placed in antegrade fashion after making a small 1 cm skin incision below the proximal ureter and using a dual lumen introducer a ZIPwire was again advanced  all the way down acting as a safety wire to the level of the conduit.  It was set aside and the sheath was again placed over the Super Stiff wire to the proximal ureter.  Antegrade ureteroscopy was then performed, which revealed an unremarkable left  proximal ureter.  It was inspected down to the level of suspected narrowing and as expected, there was some relative narrowing in this location.  Given this corresponded to approximately the crossing the midline, there is no obvious tumor in this area.   The ureter was smooth walled but narrow.  Given the segment likely being approximately 3-4 cm based on fluoroscopic imaging, it was not felt that laser incision would be warranted but rather  balloon dilation and under direct vision, a sensor wire was  placed via the ureteroscope to the level of the conduit was  verified fluoroscopically over which a 4 cm x 15 French balloon dilation apparatus was very carefully advanced under fluoroscopic vision and inflated to a pressure of 20 atmospheres, held for 90  seconds and advanced distally so that it traversed the area of anastomosis.  The conduit also inflated to a pressure of 20 atmospheres, held for 90 seconds and removed.  Antegrade ureteroscopy was then again performed, which revealed resolution of the  prior area of narrowing and easy passage of the ureteroscope all the way to the level of the conduit.  Notably, ureteroenteric anastomosis was widely patent and again the estimated length of the stricture was approximately 3-4 cm.  Again, likely  corresponding to the area of ureter just distal to the retrocolic tunnel.  The ureteral segment did appear viable and intact.  The ureteroscope was carefully removed under continuous vision as was the antegrade access sheath and the KMP catheter was then  advanced over the sensor working wire across the area of the ureteroenteric anastomosis and area of dilation acting as an antegrade stent.  It was capped.  Cystoscope was removed.  The percutaneous site was closed at the level of the skin using U-stitch  of Vicryl x2 and then the antegrade nephroureteral stent anchored using 3-0 nylon x2.  Hemostasis appeared excellent.  No evidence of hematoma.  Sponge, needle counts were correct.  A dressing of 4 x 4 and Tegaderm was applied at the flank and the  procedure terminated.  The patient tolerated the procedure well.  No immediate perioperative complications.  The patient was taken to the postanesthesia care in stable condition with tentative plan for discharge home.  TN/NUANCE  D:02/17/2019 T:02/17/2019 JOB:[id number]

## 2019-02-19 LAB — CBC
HCT: 33.1 % — ABNORMAL LOW (ref 39.0–52.0)
Hemoglobin: 10.4 g/dL — ABNORMAL LOW (ref 13.0–17.0)
MCH: 28 pg (ref 26.0–34.0)
MCHC: 31.4 g/dL (ref 30.0–36.0)
MCV: 89 fL (ref 80.0–100.0)
Platelets: 209 10*3/uL (ref 150–400)
RBC: 3.72 MIL/uL — ABNORMAL LOW (ref 4.22–5.81)
RDW: 16.6 % — ABNORMAL HIGH (ref 11.5–15.5)
WBC: 14 10*3/uL — ABNORMAL HIGH (ref 4.0–10.5)
nRBC: 0 % (ref 0.0–0.2)

## 2019-02-19 LAB — BASIC METABOLIC PANEL
Anion gap: 10 (ref 5–15)
BUN: 23 mg/dL (ref 8–23)
CO2: 25 mmol/L (ref 22–32)
Calcium: 9.5 mg/dL (ref 8.9–10.3)
Chloride: 103 mmol/L (ref 98–111)
Creatinine, Ser: 1.48 mg/dL — ABNORMAL HIGH (ref 0.61–1.24)
GFR calc Af Amer: 54 mL/min — ABNORMAL LOW (ref >60)
GFR calc non Af Amer: 47 mL/min — ABNORMAL LOW (ref >60)
Glucose, Bld: 100 mg/dL — ABNORMAL HIGH (ref 70–99)
Potassium: 4.1 mmol/L (ref 3.5–5.1)
Sodium: 138 mmol/L (ref 135–145)

## 2019-02-19 MED ORDER — ENSURE ENLIVE PO LIQD
237.0000 mL | Freq: Two times a day (BID) | ORAL | Status: DC
  Administered 2019-02-20: 09:00:00 237 mL via ORAL

## 2019-02-19 MED ORDER — POLYETHYLENE GLYCOL 3350 17 G PO PACK
17.0000 g | PACK | Freq: Every day | ORAL | Status: DC
  Administered 2019-02-19 – 2019-02-20 (×2): 17 g via ORAL
  Filled 2019-02-19 (×2): qty 1

## 2019-02-19 MED ORDER — CIPROFLOXACIN HCL 500 MG PO TABS: 500.0000 mg | ORAL_TABLET | Freq: Two times a day (BID) | ORAL | 0 refills | Status: AC

## 2019-02-19 MED ORDER — VANCOMYCIN HCL 10 G IV SOLR
1250.0000 mg | INTRAVENOUS | Status: DC
  Administered 2019-02-20: 02:00:00 1250 mg via INTRAVENOUS
  Filled 2019-02-19: qty 1250

## 2019-02-19 MED ORDER — FLEET ENEMA 7-19 GM/118ML RE ENEM
1.0000 | ENEMA | Freq: Once | RECTAL | Status: AC
  Administered 2019-02-19: 09:00:00 1 via RECTAL
  Filled 2019-02-19: qty 1

## 2019-02-19 NOTE — Progress Notes (Signed)
Fleets enema given earlier this AM per MD orders. Pt noted to have had small light colored BM. Pt remains with c/o feeling bloated. Fluids encouraged and maintain current plan of care for Pt

## 2019-02-19 NOTE — Progress Notes (Signed)
S: 1 - Fevers / Suspect Urosepsis After Ureteroscopy - Fevers to 103 with rigors one hour after antegrade ureteroscopy / stricture dilation. Most recent prior UCX pseudomonas pan-sensitive and was on Cipro pre-op and received extended interval gent peri-op.   9/20: AFVSS, no fevers since 9/18 however does have some mild diffuse abdominal discomfort and constipation. Fleet enema given per patient request this morning with small BM but continues to have some abdominal pain later in the day.   WBC 14 Creatinine 1.5 Urine culture (9/18) insignificant growth Blood cultures ngtd  O: Vitals:   02/18/19 1904 02/19/19 0428  BP: 128/80 (!) 148/79  Pulse: 84 78  Resp: 18 18  Temp: 98.5 F (36.9 C) 98.9 F (37.2 C)  SpO2: 97% 100%   NAD Non-labored breathing  Abd soft, RLQ urostomy with non-foul urine, mild diffuse ttp.  Left flank nephroureteral stent capped with dressing in place  A/P: Patient is improving on empiric antibiotics.  Reassuring that most recent urine culture demonstrates no significant growth; last fever at 1600 on 9/18.  Vitals signs stable and leukocytosis downtrending. Cultures negative to date. He does have some new mild abdominal pain, possibly constipation related, not improved much after enema and small BM so will continue to monitor today. Likely discharge tomorrow if abdominal pain better and continues to improve.

## 2019-02-19 NOTE — Progress Notes (Signed)
Pharmacy Antibiotic Note  Billy Rasmussen is a 73 y.o. male with hx recurrent bladder cancer, pyelonephritis, and PsA UTI in 12/2018, admitted on 02/17/2019 for ureteroscopy for left hydronephritis.  He's now s/p left ureteroscopy with balloon dilation of stricture and exchange of left nephroureteral stent.  Vancomycin and zosyn started post-op for suspected urosepsis.  Today, 02/19/2019: - Afebrile - WBC improved - SCr rising to 1.48 CrCl 49 - all cultures have been negative thus far   Plan: - adjust vancomycin from 1000 mg IV q24h to 1250mg  IV q36 - goal AUC 400-550 - Zosyn dosing remains the same - f/u renal function closely, monitor clinical status  ______________________________________  Height: 5\' 6"  (167.6 cm) Weight: 202 lb 9 oz (91.9 kg) IBW/kg (Calculated) : 63.8  Temp (24hrs), Avg:98.5 F (36.9 C), Min:98 F (36.7 C), Max:99 F (37.2 C)  Recent Labs  Lab 02/18/19 0202 02/18/19 0839 02/19/19 0507  WBC 22.0*  --  14.0*  CREATININE 1.36*  --  1.48*  LATICACIDVEN  --  1.8  --     Estimated Creatinine Clearance: 48.6 mL/min (A) (by C-G formula based on SCr of 1.48 mg/dL (H)).    No Known Allergies  Antimicrobials this admission: 9/18 Gent x1 pre-op 9/18 Zosyn >>  9/18 Vanc >>    Microbiology results: 9/15 COVID: negative 9/18 BCx x2:  9/18 UCx:   9/18 MRSA PCR: neg   01/02/19 UCx: Pseudomonas aeruginosa- pansensitive  Thank you for allowing pharmacy to be a part of this patient's care.  Kara Mead 02/19/2019 11:11 AM

## 2019-02-19 NOTE — Progress Notes (Signed)
Initial Nutrition Assessment  DOCUMENTATION CODES:   Obesity unspecified  INTERVENTION:   -Ensure Enlive po BID, each supplement provides 350 kcal and 20 grams of protein  NUTRITION DIAGNOSIS:   Inadequate oral intake related to poor appetite as evidenced by meal completion < 25%.  GOAL:   Patient will meet greater than or equal to 90% of their needs  MONITOR:   PO intake, Supplement acceptance, Labs, Weight trends, I & O's  REASON FOR ASSESSMENT:   Malnutrition Screening Tool    ASSESSMENT:   73 y.o. male patient [place] sig for obesity, partial colectomy / colostomy / colostomy take down for diverticulitis mid 1990s (pre-cystectomy) , bilateral total knee, bilateral shoulder surgery, lipoma removal x several. 9/18: s/p URETEROSCOPY WITH BALLOON DILATION  AND NEPHROSTOGRAM (Left)  **RD working remotely**  Patient currently consuming 10% of meals.  Pt has not had a BM this admission, receiving Miralax and Senokot. Small BM following Fleet enema. Abdominal pain is most likely affecting appetite. Will order Ensure supplements as pt is not eating well currently.  Per weight records, pt has lost 31 lbs since 6/10 (13% wt loss x 3 months, significant for time frame).  I/Os: +63 ml since admit UOP 9/19: 1950 ml  Medications reviewed.  Labs reviewed: GFR: 47  NUTRITION - FOCUSED PHYSICAL EXAM:  Unable to perform -working remotely.  Diet Order:   Diet Order            Diet regular Room service appropriate? Yes; Fluid consistency: Thin  Diet effective now              EDUCATION NEEDS:   No education needs have been identified at this time  Skin:  Skin Assessment: Reviewed RN Assessment  Last BM:  PTA  Height:   Ht Readings from Last 1 Encounters:  02/17/19 5\' 6"  (1.676 m)    Weight:   Wt Readings from Last 1 Encounters:  02/17/19 91.9 kg    Ideal Body Weight:  64.5 kg  BMI:  Body mass index is 32.69 kg/m.  Estimated Nutritional Needs:    Kcal:  1800-2000  Protein:  80-90g  Fluid:  2L/day  Clayton Bibles, MS, RD, LDN Inpatient Clinical Dietitian Pager: [phone] After Hours Pager: [phone]

## 2019-02-20 ENCOUNTER — Encounter (HOSPITAL_BASED_OUTPATIENT_CLINIC_OR_DEPARTMENT_OTHER): Payer: Self-pay | Admitting: Urology

## 2019-02-20 LAB — CBC
HCT: 33 % — ABNORMAL LOW (ref 39.0–52.0)
Hemoglobin: 10.5 g/dL — ABNORMAL LOW (ref 13.0–17.0)
MCH: 28.1 pg (ref 26.0–34.0)
MCHC: 31.8 g/dL (ref 30.0–36.0)
MCV: 88.2 fL (ref 80.0–100.0)
Platelets: 260 10*3/uL (ref 150–400)
RBC: 3.74 MIL/uL — ABNORMAL LOW (ref 4.22–5.81)
RDW: 16.7 % — ABNORMAL HIGH (ref 11.5–15.5)
WBC: 11 10*3/uL — ABNORMAL HIGH (ref 4.0–10.5)
nRBC: 0 % (ref 0.0–0.2)

## 2019-02-20 LAB — BASIC METABOLIC PANEL
Anion gap: 10 (ref 5–15)
BUN: 17 mg/dL (ref 8–23)
CO2: 24 mmol/L (ref 22–32)
Calcium: 9.6 mg/dL (ref 8.9–10.3)
Chloride: 104 mmol/L (ref 98–111)
Creatinine, Ser: 1.13 mg/dL (ref 0.61–1.24)
GFR calc Af Amer: >60 mL/min (ref >60)
GFR calc non Af Amer: >60 mL/min (ref >60)
Glucose, Bld: 95 mg/dL (ref 70–99)
Potassium: 4 mmol/L (ref 3.5–5.1)
Sodium: 138 mmol/L (ref 135–145)

## 2019-02-20 NOTE — Discharge Summary (Signed)
Physician Discharge Summary  Patient ID: RUSSEL RUMMLER MRN: [id number] DOB/AGE: 71/07/1945 71 y.o.  Admit date: 02/17/2019 Discharge date: 02/20/2019  Admission Diagnoses: Fevers After Ureteroscopy  Discharge Diagnoses:  Active Problems:   Sepsis due to urinary tract infection [facility])   Discharged Condition: good  Hospital Course: Pt admitted post-op after left antegrade ureteroscopy on 9/18 as he developed fevers to 103 and rigors in PACU worriseom for impending urosepsis. Placed on empiric Vanc + Zosyn IV and admitted stepdown. UCX and [place] 9/18 no growth / scant. Fever curve quickly subsided. By 9/21, the day of discharge, he is ambulatory, tollerating PO intake with resumed bowel function, fever free x 24 hours, and felt to be adequate for discharge.   Consults: None  Significant Diagnostic Studies: labs: as per above  Treatments: IV hydration and antibiotics: vanc + zosyn  Discharge Exam: Blood pressure (!) 130/98, pulse (!) 103, temperature 98 F (36.7 C), temperature source Oral, resp. rate 18, height 5\' 6"  (1.676 m), weight 91.9 kg, SpO2 97 %. General appearance: alert, cooperative, appears stated age and at baseline Eyes: negative Nose: Nares normal. Septum midline. Mucosa normal. No drainage or sinus tenderness. Throat: lips, mucosa, and tongue normal; teeth and gums normal Neck: supple, symmetrical, trachea midline Back: symmetric, no curvature. ROM normal. No CVA tenderness. Resp: non-labored on room air Cardio: mild regulat tachy, stable.  GI: soft, non-tender; bowel sounds normal; no masses,  no organomegaly and RLQ Urostomy pink / patent wtih copious non-foul urien. Left flank neph stent capped adn dressign c/d/i.  Extremities: extremities normal, atraumatic, no cyanosis or edema Pulses: 2+ and symmetric Skin: Skin color, texture, turgor normal. No rashes or lesions Lymph nodes: Cervical, supraclavicular, and axillary nodes normal.  Disposition: Discharge  disposition: 01-Home or Self Care        Allergies as of 02/20/2019   No Known Allergies     Medication List    STOP taking these medications   acetaminophen 500 MG tablet Commonly known as: TYLENOL   amLODipine 10 MG tablet Commonly known as: NORVASC   oxyCODONE 5 MG immediate release tablet Commonly known as: Oxy IR/ROXICODONE   polyethylene glycol 17 g packet Commonly known as: MIRALAX / GLYCOLAX   senna-docusate 8.6-50 MG tablet Commonly known as: Senokot-S     TAKE these medications   ANTI MONKEY BUTT EX Apply 1 application topically daily as needed (irritation).   CALCIUM 600 + D PO Take 1 tablet by mouth daily.   ciprofloxacin 500 MG tablet Commonly known as: CIPRO Take 1 tablet (500 mg total) by mouth 2 (two) times daily for 13 days. X 10 days now. Also take for 3 days starting day before next Urology appontment. What changed: additional instructions   diclofenac 75 MG EC tablet Commonly known as: VOLTAREN TAKE 1 TABLET BY MOUTH TWICE A DAY   diphenhydrAMINE 25 MG tablet Commonly known as: BENADRYL Take 25 mg by mouth daily as needed for itching.   docusate sodium 100 MG capsule Commonly known as: COLACE Take 200 mg by mouth daily.   GAS-X PO Take 1 tablet by mouth daily as needed (gas).   lisinopril-hydrochlorothiazide 20-12.5 MG tablet Commonly known as: ZESTORETIC Take 1 tablet by mouth daily.   meclizine 25 MG tablet Commonly known as: ANTIVERT Take 25 mg by mouth 3 (three) times daily as needed for dizziness.   multivitamin with minerals Tabs tablet Take 1 tablet by mouth daily.   SYSTANE ULTRA OP Place 1 drop into both eyes  daily as needed (dry eyes).   traMADol 50 MG tablet Commonly known as: Ultram Take 1-2 tablets (50-100 mg total) by mouth every 6 (six) hours as needed for moderate pain. Post-operatively   Zinc 50 MG Caps Take 50 mg by mouth daily.      Follow-up Information    Alexis Frock, MD On 03/07/2019.    Specialty: Urology Why: at 9:30 for MD visit.  Contact information: [place] [address] [phone]           Signed: Alexis Frock 02/20/2019, 10:40 AM

## 2019-02-20 NOTE — Progress Notes (Signed)
Went over discharge papers with patient.  All questions answered.  VSS. Wheeled out via NT.  

## 2019-02-22 DIAGNOSIS — A4152 Sepsis due to Pseudomonas: Secondary | ICD-10-CM | POA: Diagnosis not present

## 2019-02-22 DIAGNOSIS — C679 Malignant neoplasm of bladder, unspecified: Secondary | ICD-10-CM | POA: Diagnosis not present

## 2019-02-22 DIAGNOSIS — B952 Enterococcus as the cause of diseases classified elsewhere: Secondary | ICD-10-CM | POA: Diagnosis not present

## 2019-02-22 DIAGNOSIS — N136 Pyonephrosis: Secondary | ICD-10-CM | POA: Diagnosis not present

## 2019-02-22 DIAGNOSIS — D630 Anemia in neoplastic disease: Secondary | ICD-10-CM | POA: Diagnosis not present

## 2019-02-22 DIAGNOSIS — B957 Other staphylococcus as the cause of diseases classified elsewhere: Secondary | ICD-10-CM | POA: Diagnosis not present

## 2019-02-22 LAB — CULTURE, BLOOD (ROUTINE X 2)
Culture: NO GROWTH
Culture: NO GROWTH
Special Requests: ADEQUATE

## 2019-02-23 ENCOUNTER — Other Ambulatory Visit: Payer: Self-pay | Admitting: *Deleted

## 2019-02-23 NOTE — Patient Outreach (Signed)
[facility]) Care Management  02/23/2019  Billy Rasmussen [birth year] [id number]     EMMI-GENERAL DISCHARGE  RED ON EMMI ALERT Day # 1 Date: 02/22/2019 Red Alert Reason: Don't know who to call if condition changes   Outreach attempt #1  RN able to speak with the pt and verified identifiers. RN further expressed the purpose for today's call and inquire further on pt's red flag. Pt indicated he may have hit the wrong button. States depending on the problem he would call either his primary provider or the surgeon.  RN inquired on any other issues or problems related to his ongoing recovery that may need addressing. Pt inquired on a dressing that remains on his back from where a tube was pulled while in the hospital. RN strongly encouraged pt to contact his provider and not remove the dressing unless other wise instructed. Educated pt on possible signs or symptoms related to possible infection and to contact his provider immediately for interventions. Pt verbalized an understanding and will follow up with his provider (pending visit in Oct). Pt indicates at this time further no needs but grateful for the call.   Plan: RN CM will close this case with no further needs to address at this time.  Raina Mina, RN Care Management Coordinator [place] Office [phone]

## 2019-02-24 ENCOUNTER — Telehealth: Payer: Self-pay | Admitting: Physician Assistant

## 2019-02-24 NOTE — Telephone Encounter (Signed)
lmtcb with reason for why he needs referral to cardiology

## 2019-02-27 DIAGNOSIS — B957 Other staphylococcus as the cause of diseases classified elsewhere: Secondary | ICD-10-CM | POA: Diagnosis not present

## 2019-02-27 DIAGNOSIS — A4152 Sepsis due to Pseudomonas: Secondary | ICD-10-CM | POA: Diagnosis not present

## 2019-02-27 DIAGNOSIS — N136 Pyonephrosis: Secondary | ICD-10-CM | POA: Diagnosis not present

## 2019-02-27 DIAGNOSIS — D630 Anemia in neoplastic disease: Secondary | ICD-10-CM | POA: Diagnosis not present

## 2019-02-27 DIAGNOSIS — B952 Enterococcus as the cause of diseases classified elsewhere: Secondary | ICD-10-CM | POA: Diagnosis not present

## 2019-02-27 DIAGNOSIS — C679 Malignant neoplasm of bladder, unspecified: Secondary | ICD-10-CM | POA: Diagnosis not present

## 2019-02-27 NOTE — Telephone Encounter (Signed)
Attempts have been made to patient - no call back. This encounter will be closed.

## 2019-03-01 ENCOUNTER — Other Ambulatory Visit: Payer: Self-pay | Admitting: Physician Assistant

## 2019-03-03 DIAGNOSIS — B957 Other staphylococcus as the cause of diseases classified elsewhere: Secondary | ICD-10-CM | POA: Diagnosis not present

## 2019-03-03 DIAGNOSIS — A4152 Sepsis due to Pseudomonas: Secondary | ICD-10-CM | POA: Diagnosis not present

## 2019-03-03 DIAGNOSIS — C679 Malignant neoplasm of bladder, unspecified: Secondary | ICD-10-CM | POA: Diagnosis not present

## 2019-03-03 DIAGNOSIS — B952 Enterococcus as the cause of diseases classified elsewhere: Secondary | ICD-10-CM | POA: Diagnosis not present

## 2019-03-03 DIAGNOSIS — D630 Anemia in neoplastic disease: Secondary | ICD-10-CM | POA: Diagnosis not present

## 2019-03-03 DIAGNOSIS — N136 Pyonephrosis: Secondary | ICD-10-CM | POA: Diagnosis not present

## 2019-03-06 DIAGNOSIS — N136 Pyonephrosis: Secondary | ICD-10-CM | POA: Diagnosis not present

## 2019-03-06 DIAGNOSIS — C679 Malignant neoplasm of bladder, unspecified: Secondary | ICD-10-CM | POA: Diagnosis not present

## 2019-03-06 DIAGNOSIS — B957 Other staphylococcus as the cause of diseases classified elsewhere: Secondary | ICD-10-CM | POA: Diagnosis not present

## 2019-03-06 DIAGNOSIS — A4152 Sepsis due to Pseudomonas: Secondary | ICD-10-CM | POA: Diagnosis not present

## 2019-03-06 DIAGNOSIS — D630 Anemia in neoplastic disease: Secondary | ICD-10-CM | POA: Diagnosis not present

## 2019-03-06 DIAGNOSIS — B952 Enterococcus as the cause of diseases classified elsewhere: Secondary | ICD-10-CM | POA: Diagnosis not present

## 2019-03-07 DIAGNOSIS — N130 Hydronephrosis with ureteropelvic junction obstruction: Secondary | ICD-10-CM | POA: Diagnosis not present

## 2019-03-07 DIAGNOSIS — C678 Malignant neoplasm of overlapping sites of bladder: Secondary | ICD-10-CM | POA: Diagnosis not present

## 2019-03-07 DIAGNOSIS — N10 Acute tubulo-interstitial nephritis: Secondary | ICD-10-CM | POA: Diagnosis not present

## 2019-03-16 ENCOUNTER — Encounter: Payer: Self-pay | Admitting: Family Medicine

## 2019-03-16 ENCOUNTER — Emergency Department (HOSPITAL_COMMUNITY): Payer: Medicare Other

## 2019-03-16 ENCOUNTER — Encounter (HOSPITAL_COMMUNITY): Payer: Self-pay | Admitting: Emergency Medicine

## 2019-03-16 ENCOUNTER — Other Ambulatory Visit: Payer: Self-pay

## 2019-03-16 ENCOUNTER — Inpatient Hospital Stay (HOSPITAL_COMMUNITY)
Admission: EM | Admit: 2019-03-16 | Discharge: 2019-03-21 | DRG: 871 | Disposition: A | Discharge status: Home Health | Payer: Medicare Other | Attending: Internal Medicine | Admitting: Internal Medicine

## 2019-03-16 ENCOUNTER — Ambulatory Visit (INDEPENDENT_AMBULATORY_CARE_PROVIDER_SITE_OTHER): Payer: Medicare Other | Admitting: Family Medicine

## 2019-03-16 DIAGNOSIS — Z96653 Presence of artificial knee joint, bilateral: Secondary | ICD-10-CM | POA: Diagnosis present

## 2019-03-16 DIAGNOSIS — N10 Acute tubulo-interstitial nephritis: Secondary | ICD-10-CM | POA: Diagnosis not present

## 2019-03-16 DIAGNOSIS — G4733 Obstructive sleep apnea (adult) (pediatric): Secondary | ICD-10-CM | POA: Diagnosis present

## 2019-03-16 DIAGNOSIS — Z96612 Presence of left artificial shoulder joint: Secondary | ICD-10-CM | POA: Diagnosis present

## 2019-03-16 DIAGNOSIS — I10 Essential (primary) hypertension: Secondary | ICD-10-CM | POA: Diagnosis present

## 2019-03-16 DIAGNOSIS — L409 Psoriasis, unspecified: Secondary | ICD-10-CM | POA: Diagnosis present

## 2019-03-16 DIAGNOSIS — N170 Acute kidney failure with tubular necrosis: Secondary | ICD-10-CM | POA: Diagnosis present

## 2019-03-16 DIAGNOSIS — C678 Malignant neoplasm of overlapping sites of bladder: Secondary | ICD-10-CM | POA: Diagnosis not present

## 2019-03-16 DIAGNOSIS — Z906 Acquired absence of other parts of urinary tract: Secondary | ICD-10-CM

## 2019-03-16 DIAGNOSIS — R5381 Other malaise: Secondary | ICD-10-CM | POA: Diagnosis not present

## 2019-03-16 DIAGNOSIS — A4152 Sepsis due to Pseudomonas: Secondary | ICD-10-CM | POA: Diagnosis not present

## 2019-03-16 DIAGNOSIS — N131 Hydronephrosis with ureteral stricture, not elsewhere classified: Secondary | ICD-10-CM | POA: Diagnosis not present

## 2019-03-16 DIAGNOSIS — Z96611 Presence of right artificial shoulder joint: Secondary | ICD-10-CM | POA: Diagnosis present

## 2019-03-16 DIAGNOSIS — Z9089 Acquired absence of other organs: Secondary | ICD-10-CM | POA: Diagnosis not present

## 2019-03-16 DIAGNOSIS — R000 Tachycardia, unspecified: Secondary | ICD-10-CM | POA: Diagnosis not present

## 2019-03-16 DIAGNOSIS — Z9079 Acquired absence of other genital organ(s): Secondary | ICD-10-CM | POA: Diagnosis not present

## 2019-03-16 DIAGNOSIS — A4159 Other Gram-negative sepsis: Secondary | ICD-10-CM | POA: Diagnosis not present

## 2019-03-16 DIAGNOSIS — Z87440 Personal history of urinary (tract) infections: Secondary | ICD-10-CM

## 2019-03-16 DIAGNOSIS — Z936 Other artificial openings of urinary tract status: Secondary | ICD-10-CM | POA: Diagnosis not present

## 2019-03-16 DIAGNOSIS — K8020 Calculus of gallbladder without cholecystitis without obstruction: Secondary | ICD-10-CM | POA: Diagnosis not present

## 2019-03-16 DIAGNOSIS — D630 Anemia in neoplastic disease: Secondary | ICD-10-CM | POA: Diagnosis present

## 2019-03-16 DIAGNOSIS — Z8619 Personal history of other infectious and parasitic diseases: Secondary | ICD-10-CM

## 2019-03-16 DIAGNOSIS — Z79899 Other long term (current) drug therapy: Secondary | ICD-10-CM

## 2019-03-16 DIAGNOSIS — C679 Malignant neoplasm of bladder, unspecified: Secondary | ICD-10-CM | POA: Diagnosis present

## 2019-03-16 DIAGNOSIS — A419 Sepsis, unspecified organism: Secondary | ICD-10-CM | POA: Diagnosis not present

## 2019-03-16 DIAGNOSIS — Z20828 Contact with and (suspected) exposure to other viral communicable diseases: Secondary | ICD-10-CM | POA: Diagnosis present

## 2019-03-16 DIAGNOSIS — Z9049 Acquired absence of other specified parts of digestive tract: Secondary | ICD-10-CM

## 2019-03-16 DIAGNOSIS — M797 Fibromyalgia: Secondary | ICD-10-CM | POA: Diagnosis present

## 2019-03-16 DIAGNOSIS — R5383 Other fatigue: Secondary | ICD-10-CM | POA: Diagnosis not present

## 2019-03-16 DIAGNOSIS — Z8711 Personal history of peptic ulcer disease: Secondary | ICD-10-CM

## 2019-03-16 DIAGNOSIS — Z1612 Extended spectrum beta lactamase (ESBL) resistance: Secondary | ICD-10-CM | POA: Diagnosis present

## 2019-03-16 DIAGNOSIS — R6520 Severe sepsis without septic shock: Secondary | ICD-10-CM | POA: Diagnosis present

## 2019-03-16 DIAGNOSIS — Z960 Presence of urogenital implants: Secondary | ICD-10-CM | POA: Diagnosis present

## 2019-03-16 DIAGNOSIS — N136 Pyonephrosis: Secondary | ICD-10-CM | POA: Diagnosis present

## 2019-03-16 DIAGNOSIS — N179 Acute kidney failure, unspecified: Secondary | ICD-10-CM | POA: Diagnosis not present

## 2019-03-16 DIAGNOSIS — R509 Fever, unspecified: Secondary | ICD-10-CM | POA: Diagnosis not present

## 2019-03-16 DIAGNOSIS — Z791 Long term (current) use of non-steroidal anti-inflammatories (NSAID): Secondary | ICD-10-CM

## 2019-03-16 DIAGNOSIS — Z87891 Personal history of nicotine dependence: Secondary | ICD-10-CM

## 2019-03-16 DIAGNOSIS — Z8249 Family history of ischemic heart disease and other diseases of the circulatory system: Secondary | ICD-10-CM

## 2019-03-16 DIAGNOSIS — N132 Hydronephrosis with renal and ureteral calculous obstruction: Secondary | ICD-10-CM | POA: Diagnosis not present

## 2019-03-16 DIAGNOSIS — G9341 Metabolic encephalopathy: Secondary | ICD-10-CM | POA: Diagnosis not present

## 2019-03-16 LAB — CBC WITH DIFFERENTIAL/PLATELET
Abs Immature Granulocytes: 0.12 10*3/uL — ABNORMAL HIGH (ref 0.00–0.07)
Basophils Absolute: 0 10*3/uL (ref 0.0–0.1)
Basophils Relative: 0 %
Eosinophils Absolute: 0 10*3/uL (ref 0.0–0.5)
Eosinophils Relative: 0 %
HCT: 32.4 % — ABNORMAL LOW (ref 39.0–52.0)
Hemoglobin: 10.3 g/dL — ABNORMAL LOW (ref 13.0–17.0)
Immature Granulocytes: 1 %
Lymphocytes Relative: 3 %
Lymphs Abs: 0.5 10*3/uL — ABNORMAL LOW (ref 0.7–4.0)
MCH: 27.9 pg (ref 26.0–34.0)
MCHC: 31.8 g/dL (ref 30.0–36.0)
MCV: 87.8 fL (ref 80.0–100.0)
Monocytes Absolute: 1 10*3/uL (ref 0.1–1.0)
Monocytes Relative: 5 %
Neutro Abs: 17.9 10*3/uL — ABNORMAL HIGH (ref 1.7–7.7)
Neutrophils Relative %: 91 %
Platelets: 256 10*3/uL (ref 150–400)
RBC: 3.69 MIL/uL — ABNORMAL LOW (ref 4.22–5.81)
RDW: 17.2 % — ABNORMAL HIGH (ref 11.5–15.5)
WBC: 19.6 10*3/uL — ABNORMAL HIGH (ref 4.0–10.5)
nRBC: 0 % (ref 0.0–0.2)

## 2019-03-16 LAB — COMPREHENSIVE METABOLIC PANEL
ALT: 13 U/L (ref 0–44)
AST: 26 U/L (ref 15–41)
Albumin: 3.7 g/dL (ref 3.5–5.0)
Alkaline Phosphatase: 73 U/L (ref 38–126)
Anion gap: 12 (ref 5–15)
BUN: 30 mg/dL — ABNORMAL HIGH (ref 8–23)
CO2: 22 mmol/L (ref 22–32)
Calcium: 9.4 mg/dL (ref 8.9–10.3)
Chloride: 100 mmol/L (ref 98–111)
Creatinine, Ser: 1.51 mg/dL — ABNORMAL HIGH (ref 0.61–1.24)
GFR calc Af Amer: 53 mL/min — ABNORMAL LOW (ref >60)
GFR calc non Af Amer: 46 mL/min — ABNORMAL LOW (ref >60)
Glucose, Bld: 180 mg/dL — ABNORMAL HIGH (ref 70–99)
Potassium: 4.3 mmol/L (ref 3.5–5.1)
Sodium: 134 mmol/L — ABNORMAL LOW (ref 135–145)
Total Bilirubin: 1.1 mg/dL (ref 0.3–1.2)
Total Protein: 8.2 g/dL — ABNORMAL HIGH (ref 6.5–8.1)

## 2019-03-16 LAB — URINALYSIS, ROUTINE W REFLEX MICROSCOPIC
Bilirubin Urine: NEGATIVE
Glucose, UA: NEGATIVE mg/dL
Ketones, ur: NEGATIVE mg/dL
Nitrite: POSITIVE — AB
Protein, ur: 100 mg/dL — AB
Specific Gravity, Urine: 1.015 (ref 1.005–1.030)
WBC, UA: >50 WBC/hpf — ABNORMAL HIGH (ref 0–5)
pH: 6 (ref 5.0–8.0)

## 2019-03-16 LAB — PROTIME-INR
INR: 1.2 (ref 0.8–1.2)
Prothrombin Time: 15.4 seconds — ABNORMAL HIGH (ref 11.4–15.2)

## 2019-03-16 LAB — SARS CORONAVIRUS 2 BY RT PCR (HOSPITAL ORDER, PERFORMED IN ~~LOC~~ HOSPITAL LAB): SARS Coronavirus 2: NEGATIVE

## 2019-03-16 LAB — LACTIC ACID, PLASMA: Lactic Acid, Venous: 1.9 mmol/L (ref 0.5–1.9)

## 2019-03-16 MED ORDER — SODIUM CHLORIDE 0.9 % IV SOLN
1.0000 g | Freq: Once | INTRAVENOUS | Status: AC
  Administered 2019-03-16: 1 g via INTRAVENOUS
  Filled 2019-03-16: qty 10

## 2019-03-16 MED ORDER — IOHEXOL 300 MG/ML  SOLN
100.0000 mL | Freq: Once | INTRAMUSCULAR | Status: AC | PRN
  Administered 2019-03-16: 100 mL via INTRAVENOUS

## 2019-03-16 MED ORDER — DOCUSATE SODIUM 100 MG PO CAPS
200.0000 mg | ORAL_CAPSULE | Freq: Every day | ORAL | Status: DC
  Administered 2019-03-18 – 2019-03-21 (×3): 200 mg via ORAL
  Filled 2019-03-16 (×3): qty 2

## 2019-03-16 MED ORDER — SODIUM CHLORIDE 0.9 % IV SOLN
INTRAVENOUS | Status: AC
  Administered 2019-03-17 (×3): via INTRAVENOUS

## 2019-03-16 MED ORDER — HEPARIN SODIUM (PORCINE) 5000 UNIT/ML IJ SOLN
5000.0000 [IU] | Freq: Three times a day (TID) | INTRAMUSCULAR | Status: DC
  Administered 2019-03-17 – 2019-03-21 (×13): 5000 [IU] via SUBCUTANEOUS
  Filled 2019-03-16 (×13): qty 1

## 2019-03-16 MED ORDER — ACETAMINOPHEN 650 MG RE SUPP: 650.0000 mg | Freq: Four times a day (QID) | RECTAL | Status: DC | PRN

## 2019-03-16 MED ORDER — SODIUM CHLORIDE 0.9 % IV BOLUS
1000.0000 mL | Freq: Once | INTRAVENOUS | Status: AC
  Administered 2019-03-16: 1000 mL via INTRAVENOUS

## 2019-03-16 MED ORDER — SODIUM CHLORIDE 0.9 % IV SOLN: 2.0000 g | INTRAVENOUS | Status: DC

## 2019-03-16 MED ORDER — ACETAMINOPHEN 325 MG PO TABS
650.0000 mg | ORAL_TABLET | Freq: Four times a day (QID) | ORAL | Status: DC | PRN
  Administered 2019-03-17 – 2019-03-20 (×7): 650 mg via ORAL
  Filled 2019-03-16 (×8): qty 2

## 2019-03-16 MED ORDER — SODIUM CHLORIDE 0.9 % IV BOLUS
2000.0000 mL | Freq: Once | INTRAVENOUS | Status: AC
  Administered 2019-03-16: 1000 mL via INTRAVENOUS

## 2019-03-16 MED ORDER — ONDANSETRON HCL 4 MG/2ML IJ SOLN: 4.0000 mg | Freq: Four times a day (QID) | INTRAMUSCULAR | Status: DC | PRN

## 2019-03-16 MED ORDER — ACETAMINOPHEN 325 MG PO TABS
650.0000 mg | ORAL_TABLET | Freq: Once | ORAL | Status: AC
  Administered 2019-03-16: 650 mg via ORAL
  Filled 2019-03-16: qty 2

## 2019-03-16 MED ORDER — MECLIZINE HCL 25 MG PO TABS: 25.0000 mg | ORAL_TABLET | Freq: Three times a day (TID) | ORAL | Status: DC | PRN

## 2019-03-16 MED ORDER — ONDANSETRON HCL 4 MG PO TABS: 4.0000 mg | ORAL_TABLET | Freq: Four times a day (QID) | ORAL | Status: DC | PRN

## 2019-03-16 NOTE — ED Triage Notes (Signed)
Pt reports weakness and fevers since last night. Took tylenol around 3pm this after noon. Pt has stent in kidney that was placed 3 weeks ago.

## 2019-03-16 NOTE — ED Provider Notes (Signed)
Patient seen in conjunction with Cheek, PA-C.  See her note for full HPI.  73 year old male presents for evaluation of fever and left flank pain.  He has a urostomy tube from prior bladder cancer.  Seen and admitted 1 month ago for sepsis likely from pyelonephritis to his left kidney.   On evaluation he appears overall well however is febrile, tachycardic and tachypneic.  He has a white count at 19.6 with a left shift.  Urinalysis grossly positive for infection.  Will send culture.  His lactic acid was 1.9.  Covid pending.  Code sepsis was called.  He was given antibiotics for likely urinary source as well as fluids.  He has had some mildly soft blood pressures, systolic in the 0000000 however his MAP is greater than 65.  Will need admission for sepsis, likely urinary source.  Given Tylenol for fever. Hx cystectomy for bladder carcinoma. Followed by Dr. Tresa Moore with [facility].  CRITICAL CARE Performed by: Nettie Elm Total critical care time: 35 minutes Critical care time was exclusive of separately billable procedures and treating other patients.  Sepsis Critical care was necessary to treat or prevent imminent or life-threatening deterioration. Critical care was time spent personally by me on the following activities: development of treatment plan with patient and/or surrogate as well as nursing, discussions with consultants, evaluation of patient's response to treatment, examination of patient, obtaining history from patient or surrogate, ordering and performing treatments and interventions, ordering and review of laboratory studies, ordering and review of radiographic studies, pulse oximetry and re-evaluation of patient's condition.  Consulted with Dr. Hal Hope with [facility] who will evaluate patient for admission. VS stable.  The patient appears reasonably stabilized for admission considering the current resources, flow, and capabilities available in the ED at this time, and I doubt any other  [facility] requiring further screening and/or treatment in the ED prior to admission.   Vikas Wegmann A, PA-C 03/16/19 2259    Milton Ferguson, MD 03/18/19 1010

## 2019-03-16 NOTE — ED Notes (Signed)
Pt stated he is on 2.5L O2 at home while sleeping, applied O2 via nasal cannula

## 2019-03-16 NOTE — Progress Notes (Signed)
Virtual Visit via telephone Note Due to COVID-19 pandemic this visit was conducted virtually. This visit type was conducted due to national recommendations for restrictions regarding the COVID-19 Pandemic (e.g. social distancing, sheltering in place) in an effort to limit this patient's exposure and mitigate transmission in our community. All issues noted in this document were discussed and addressed.  A physical exam was not performed with this format.   I connected with Billy Rasmussen on 03/16/19 at 1615 by telephone and verified that I am speaking with the correct person using two identifiers. Billy Rasmussen is currently located at home and family is currently with them during visit. The provider, Monia Pouch, [name] is located in their office at time of visit.  I discussed the limitations, risks, security and privacy concerns of performing an evaluation and management service by telephone and the availability of in person appointments. I also discussed with the patient that there may be a patient responsible charge related to this service. The patient expressed understanding and agreed to proceed.  Subjective:  Patient ID: Billy Rasmussen, male    DOB: April 06, 1946, 73 y.o.   MRN: [id number]  Chief Complaint:  Fever   HPI: Billy Rasmussen is a 73 y.o. male presenting on 03/16/2019 for Fever   Pt and family report fever of 101.6-102 and chills for the 24 hours. States he takes tylenol and this lowers his temperature. Pt states he has had fatigue over the last several days along with general weakness. Pt has a history of bladder cancer s/p radical cystoprostatectomy 10/2018 with ileal conduit. Pt reports urostomy is draining average amount of urine but states the urine is darker in color. He denies redness or irritation around the urostomy site. He was admitted in September for urosepsis. He had a 3 day hospital stay for the urosepsis. Pt denies abdominal pain, diarrhea, confusion, cough,  chest pain, or shortness of breath. No known sick exposures.     Relevant past medical, surgical, family, and social history reviewed and updated as indicated.  Allergies and medications reviewed and updated.   Past Medical History:  Diagnosis Date  . Arthritis    knees, shoulders, elbows  . Bladder cancer [facility])    urologist-  dr Junious Silk  . Diverticulosis of colon   . Fibromyalgia   . GERD (gastroesophageal reflux disease)   . History of diverticulitis of colon   . History of gastric ulcer    due to aleve  . Hypertension   . Lower urinary tract symptoms (LUTS)   . OSA (obstructive sleep apnea)    NON-COMPLIANT  CPAP  --- BUT PT USES OXYGEN AT NIGHT 2.5L VIA [place] (PT'S DECISION)  . PONV (postoperative nausea and vomiting)   . Psoriasis   . Tinnitus    right ear more, has tranmitter in right ear removable at hs  . Wears glasses   . Wears partial dentures     Past Surgical History:  Procedure Laterality Date  . APPENDECTOMY    . COLECTOMY W/ COLOSTOMY  1996   W/   APPENDECTOMY  . COLONOSCOPY N/A 05/11/2018   Procedure: COLONOSCOPY;  Surgeon: Daneil Dolin, MD;  [facility];  Service: Endoscopy;  Laterality: N/A;  9:30  . COLOSTOMY TAKEDOWN  1996  . CYSTOSCOPY WITH BIOPSY N/A 12/05/2013   Procedure: CYSTO BLADDER BIOPSY AND FULGERATION;  Surgeon: Festus Aloe, MD;  Location[facility];  Service: Urology;  Laterality: N/A;  . CYSTOSCOPY WITH BIOPSY  Bilateral 11/13/2014   Procedure: CYSTOSCOPY WITH  BLADDER BIOPSY FULGERATION AND BILATERAL RETROGRADE PYELOGRAMS;  Surgeon: Festus Aloe, MD;  Location: [facility];  Service: Urology;  Laterality: Bilateral;  . CYSTOSCOPY WITH FULGERATION N/A 01/18/2018   Procedure: CYSTOSCOPY WITH FULGERATION/ BLADDER BIOPSY;  Surgeon: Festus Aloe, MD;  Location: [facility];  Service: Urology;  Laterality: N/A;  . CYSTOSCOPY WITH INJECTION N/A 11/09/2018   Procedure:  CYSTOSCOPY WITH INJECTION OF INDOCYANINE GREEN DYE;  Surgeon: Alexis Frock, MD;  Location: [facility];  Service: Urology;  Laterality: N/A;  . CYSTOSCOPY WITH INSERTION OF UROLIFT N/A 01/18/2018   Procedure: CYSTOSCOPY WITH INSERTION OF UROLIFT;  Surgeon: Festus Aloe, MD;  [facility];  Service: Urology;  Laterality: N/A;  . CYSTOSCOPY/URETEROSCOPY/HOLMIUM LASER Left 02/17/2019   Procedure: URETEROSCOPY WITH BALLOON DILATION  AND NEPHROSTOGRAM;  Surgeon: Alexis Frock, MD;  Location: [facility];  Service: Urology;  Laterality: Left;  1 HR  . EXCISION RIGHT UPPER ARM LIPOMA  2005  . HEMORROIDECTOMY    . INGUINAL HERNIA REPAIR Left 1984  . IR NEPHROSTOMY PLACEMENT LEFT  01/05/2019  . KNEE ARTHROSCOPY Left X3  LAST ONE  2002  . ORIF LEFT HUMEROUS FX  1976  . POLYPECTOMY  05/11/2018   Procedure: POLYPECTOMY;  Surgeon: Daneil Dolin, MD;  [facility];  Service: Endoscopy;;  . PROSTATE SURGERY    . TONSILLECTOMY  AS CHILD  . TOTAL KNEE ARTHROPLASTY Left 2006   REVISION 2007  (AFTER I & D WITH ANTIBIOTIC SPACER PROCEDURE FOR STEPH INFECTION)  . TOTAL KNEE ARTHROPLASTY Right 03/30/2016   Procedure: RIGHT TOTAL KNEE ARTHROPLASTY;  Surgeon: Paralee Cancel, MD;  Location: [facility];  Service: Orthopedics;  Laterality: Right;  . TOTAL SHOULDER ARTHROPLASTY Right 04/06/2013   Procedure: RIGHT TOTAL SHOULDER ARTHROPLASTY;  Surgeon: Marin Shutter, MD;  Location: [place];  Service: Orthopedics;  Laterality: Right;  . TOTAL SHOULDER ARTHROPLASTY Left 07/20/2013   Procedure: LEFT TOTAL SHOULDER ARTHROPLASTY;  Surgeon: Marin Shutter, MD;  Location: [place];  Service: Orthopedics;  Laterality: Left;  . TRANSURETHRAL RESECTION OF BLADDER TUMOR N/A 11/12/2015   Procedure: TRANSURETHRAL RESECTION OF BLADDER TUMOR (TURBT);  Surgeon: Festus Aloe, MD;  Location: [facility];  Service: Urology;  Laterality: N/A;  . TRANSURETHRAL RESECTION OF BLADDER  TUMOR WITH GYRUS (TURBT-GYRUS) N/A 12/05/2013   Procedure: TRANSURETHRAL RESECTION OF BLADDER TUMOR WITH GYRUS (TURBT-GYRUS);  Surgeon: Festus Aloe, MD;  Location: [facility];  Service: Urology;  Laterality: N/A;    Social History   Socioeconomic History  . Marital status: Married    Spouse name: Vickii Chafe  . Number of children: 2  . Years of education: Not on file  . Highest education level: Associate degree: occupational, Hotel manager, or vocational program  Occupational History  . Occupation: retired    Comment: [employer], Glass blower/designer   Social Needs  . Financial resource strain: Not hard at all  . Food insecurity    Worry: Patient refused    Inability: Patient refused  . Transportation needs    Medical: Patient refused    Non-medical: Patient refused  Tobacco Use  . Smoking status: Former Smoker    Packs/day: 2.00    Years: 40.00    Pack years: 80.00    Types: Cigarettes    Quit date: 06/01/1997    Years since quitting: 21.8  . Smokeless tobacco: Never Used  Substance and Sexual Activity  . Alcohol  use: No  . Drug use: No  . Sexual activity: Not Currently  Lifestyle  . Physical activity    Days per week: Patient refused    Minutes per session: Patient refused  . Stress: Not on file  Relationships  . Social Herbalist on phone: Patient refused    Gets together: Patient refused    Attends religious service: Patient refused    Active member of club or organization: Patient refused    Attends meetings of clubs or organizations: Patient refused    Relationship status: Patient refused  . Intimate partner violence    Fear of current or ex partner: Patient refused    Emotionally abused: Patient refused    Physically abused: Patient refused    Forced sexual activity: Patient refused  Other Topics Concern  . Not on file  Social History Narrative  . Not on file    No facility-administered encounter medications on file as of 03/16/2019.     Outpatient Encounter Medications as of 03/16/2019  Medication Sig  . Calcium Carb-Cholecalciferol (CALCIUM 600 + D PO) Take 1 tablet by mouth daily.  . diclofenac (VOLTAREN) 75 MG EC tablet TAKE 1 TABLET BY MOUTH TWICE A DAY  . diphenhydrAMINE (BENADRYL) 25 MG tablet Take 25 mg by mouth daily as needed for itching.  . docusate sodium (COLACE) 100 MG capsule Take 200 mg by mouth daily.  Marland Kitchen lisinopril-hydrochlorothiazide (ZESTORETIC) 20-12.5 MG tablet TAKE 1 TABLET BY MOUTH EVERY DAY  . meclizine (ANTIVERT) 25 MG tablet Take 25 mg by mouth 3 (three) times daily as needed for dizziness.  . Multiple Vitamin (MULTIVITAMIN WITH MINERALS) TABS tablet Take 1 tablet by mouth daily.  Vladimir Faster Glycol-Propyl Glycol (SYSTANE ULTRA OP) Place 1 drop into both eyes daily as needed (dry eyes).  . Powders (ANTI MONKEY BUTT EX) Apply 1 application topically daily as needed (irritation).  . Simethicone (GAS-X PO) Take 1 tablet by mouth daily as needed (gas).  . traMADol (ULTRAM) 50 MG tablet Take 1-2 tablets (50-100 mg total) by mouth every 6 (six) hours as needed for moderate pain. Post-operatively  . Zinc 50 MG CAPS Take 50 mg by mouth daily.    No Known Allergies  Review of Systems  Constitutional: Positive for activity change, chills, fatigue and fever. Negative for appetite change, diaphoresis and unexpected weight change.  HENT: Negative.   Eyes: Negative.   Respiratory: Negative for cough, chest tightness and shortness of breath.   Cardiovascular: Negative for chest pain, palpitations and leg swelling.  Gastrointestinal: Negative for abdominal pain, blood in stool, constipation, diarrhea, nausea and vomiting.  Endocrine: Negative.   Genitourinary: Negative for decreased urine volume, dysuria, flank pain, frequency and urgency.       Urostomy, dark urine output  Musculoskeletal: Negative for arthralgias and myalgias.  Skin: Negative.  Negative for color change, pallor and rash.   Allergic/Immunologic: Negative.   Neurological: Positive for weakness (generalized). Negative for dizziness, syncope, light-headedness and headaches.  Hematological: Negative.   Psychiatric/Behavioral: Negative for confusion, hallucinations, sleep disturbance and suicidal ideas.  All other systems reviewed and are negative.        Observations/Objective: No vital signs or physical exam, this was a telephone or virtual health encounter.  Pt alert and oriented, answers all questions appropriately, and able to speak in full sentences.    Assessment and Plan: Dezmond was seen today for fever.  Diagnoses and all orders for this visit:  Fever in adult Malaise and fatigue  Recent admission for urosepsis. Pt reports darker than normal urine output from urostomy with fever, chills, general weakness, and fatigue. Due to reported symptoms and pt history, pt was advised to go to ED for evaluation and treatment.     Follow Up Instructions: Return if symptoms worsen or fail to improve.    I discussed the assessment and treatment plan with the patient. The patient was provided an opportunity to ask questions and all were answered. The patient agreed with the plan and demonstrated an understanding of the instructions.   The patient was advised to call back or seek an in-person evaluation if the symptoms worsen or if the condition fails to improve as anticipated.  The above assessment and management plan was discussed with the patient. The patient verbalized understanding of and has agreed to the management plan. Patient is aware to call the clinic if they develop any new symptoms or if symptoms persist or worsen. Patient is aware when to return to the clinic for a follow-up visit. Patient educated on when it is appropriate to go to the emergency department.    I provided 15 minutes of non-face-to-face time during this encounter. The call started at 1615. The call ended at 1630. The other time  was used for coordination of care.    Monia Pouch, [name]-C [facility] [address] [place], [place] [address] [phone] 03/16/19

## 2019-03-16 NOTE — H&P (Signed)
History and Physical    Billy Rasmussen [id number] DOB: Nov 16, 1945 DOA: 03/16/2019  PCP: Terald Sleeper, PA-C  Patient coming from: Home.  Chief Complaint: Fever chills.  HPI: Billy Rasmussen is a 73 y.o. male with history of recurrent bladder cancer status post radical cystoprostatectomy and ileal conduit on November 09, 2018 who was admitted recently in August and September for septic picture from pyelonephritis had also had left stent placed for hydronephrosis presents to the ER because of fever chills.  Patient symptoms started last night with fever chills which improved after patient going to the bed.  The fever started coming back again today and patient decided come to the ER.  Denies any associated nausea vomiting diarrhea shortness of breath productive cough.  ED Course: In the ER patient was febrile with temperature of 100.7 tachycardic blood pressure in the low normal initially in the 90s.  Blood pressure improved with fluid bolus for sepsis protocol.  Chest x-ray unremarkable COVID-19 was negative.  Abdomen pelvis shows features concerning for left-sided pyelonephritis with no obstruction.  Patient was started on empiric antibiotics after obtaining blood cultures.  At the time of my exam patient blood pressure is consistently improved.  Lactate was normal.  Labs show sodium 134 creatinine 1.5 hemoglobin 10.3 WBC 19.6 platelets 256 UA consistent with UTI.  Review of Systems: As per HPI, rest all negative.   Past Medical History:  Diagnosis Date   Arthritis    knees, shoulders, elbows   Bladder cancer [facility])    urologist-  dr eskridge   Diverticulosis of colon    Fibromyalgia    GERD (gastroesophageal reflux disease)    History of diverticulitis of colon    History of gastric ulcer    due to aleve   Hypertension    Lower urinary tract symptoms (LUTS)    OSA (obstructive sleep apnea)    NON-COMPLIANT  CPAP  --- BUT PT USES OXYGEN AT NIGHT 2.5L VIA [place] (PT'S  DECISION)   PONV (postoperative nausea and vomiting)    Psoriasis    Tinnitus    right ear more, has tranmitter in right ear removable at hs   Wears glasses    Wears partial dentures     Past Surgical History:  Procedure Laterality Date   APPENDECTOMY     COLECTOMY W/ COLOSTOMY  1996   W/   APPENDECTOMY   COLONOSCOPY N/A 05/11/2018   Procedure: COLONOSCOPY;  Surgeon: Daneil Dolin, MD;  [facility];  Service: Endoscopy;  Laterality: N/A;  9:30   COLOSTOMY TAKEDOWN  1996   CYSTOSCOPY WITH BIOPSY N/A 12/05/2013   Procedure: CYSTO BLADDER BIOPSY AND FULGERATION;  Surgeon: Festus Aloe, MD;  Location: [facility];  Service: Urology;  Laterality: N/A;   CYSTOSCOPY WITH BIOPSY Bilateral 11/13/2014   Procedure: CYSTOSCOPY WITH  BLADDER BIOPSY FULGERATION AND BILATERAL RETROGRADE PYELOGRAMS;  Surgeon: Festus Aloe, MD;  Location: [facility];  Service: Urology;  Laterality: Bilateral;   CYSTOSCOPY WITH FULGERATION N/A 01/18/2018   Procedure: CYSTOSCOPY WITH FULGERATION/ BLADDER BIOPSY;  Surgeon: Festus Aloe, MD;  Location: [facility];  Service: Urology;  Laterality: N/A;   CYSTOSCOPY WITH INJECTION N/A 11/09/2018   Procedure: CYSTOSCOPY WITH INJECTION OF INDOCYANINE GREEN DYE;  Surgeon: Alexis Frock, MD;  Location: [facility];  Service: Urology;  Laterality: N/A;   CYSTOSCOPY WITH INSERTION OF UROLIFT N/A 01/18/2018   Procedure: CYSTOSCOPY WITH INSERTION OF UROLIFT;  Surgeon: Festus Aloe, MD;  Location: [place];  Service: Urology;  Laterality: N/A;   CYSTOSCOPY/URETEROSCOPY/HOLMIUM LASER Left 02/17/2019   Procedure: URETEROSCOPY WITH BALLOON DILATION  AND NEPHROSTOGRAM;  Surgeon: Alexis Frock, MD;  Location: [facility];  Service: Urology;  Laterality: Left;  1 HR   EXCISION RIGHT UPPER ARM LIPOMA  2005   HEMORROIDECTOMY     INGUINAL HERNIA REPAIR Left 1984   IR  NEPHROSTOMY PLACEMENT LEFT  01/05/2019   KNEE ARTHROSCOPY Left X3  LAST ONE  2002   ORIF LEFT HUMEROUS FX  1976   POLYPECTOMY  05/11/2018   Procedure: POLYPECTOMY;  Surgeon: Daneil Dolin, MD;  [facility];  Service: Endoscopy;;   PROSTATE SURGERY     TONSILLECTOMY  AS CHILD   TOTAL KNEE ARTHROPLASTY Left 2006   REVISION 2007  (AFTER I & D WITH ANTIBIOTIC SPACER PROCEDURE FOR STEPH INFECTION)   TOTAL KNEE ARTHROPLASTY Right 03/30/2016   Procedure: RIGHT TOTAL KNEE ARTHROPLASTY;  Surgeon: Paralee Cancel, MD;  Location: [facility];  Service: Orthopedics;  Laterality: Right;   TOTAL SHOULDER ARTHROPLASTY Right 04/06/2013   Procedure: RIGHT TOTAL SHOULDER ARTHROPLASTY;  Surgeon: Marin Shutter, MD;  Location: [place];  Service: Orthopedics;  Laterality: Right;   TOTAL SHOULDER ARTHROPLASTY Left 07/20/2013   Procedure: LEFT TOTAL SHOULDER ARTHROPLASTY;  Surgeon: Marin Shutter, MD;  Location: [place];  Service: Orthopedics;  Laterality: Left;   TRANSURETHRAL RESECTION OF BLADDER TUMOR N/A 11/12/2015   Procedure: TRANSURETHRAL RESECTION OF BLADDER TUMOR (TURBT);  Surgeon: Festus Aloe, MD;  Location: [facility];  Service: Urology;  Laterality: N/A;   TRANSURETHRAL RESECTION OF BLADDER TUMOR WITH GYRUS (TURBT-GYRUS) N/A 12/05/2013   Procedure: TRANSURETHRAL RESECTION OF BLADDER TUMOR WITH GYRUS (TURBT-GYRUS);  Surgeon: Festus Aloe, MD;  Location[facility];  Service: Urology;  Laterality: N/A;     reports that he quit smoking about 21 years ago. His smoking use included cigarettes. He has a 80.00 pack-year smoking history. He has never used smokeless tobacco. He reports that he does not drink alcohol or use drugs.  No Known Allergies  Family History  Problem Relation Age of Onset   Alzheimer's disease Mother    Heart attack Father    Heart disease Father    Irregular heart beat Brother        DEFIB.   Congestive Heart Failure Brother      Diabetes Daughter     Prior to Admission medications   Medication Sig Start Date End Date Taking? Authorizing Provider  Calcium Carb-Cholecalciferol (CALCIUM 600 + D PO) Take 1 tablet by mouth daily.   Yes [provider]  ciprofloxacin (CIPRO) 500 MG tablet Take 500 mg by mouth 2 (two) times daily. 03/07/19  Yes [provider]  diclofenac (VOLTAREN) 75 MG EC tablet TAKE 1 TABLET BY MOUTH TWICE A DAY Patient taking differently: Take 75 mg by mouth 2 (two) times daily.  02/08/19  Yes Terald Sleeper, PA-C  docusate sodium (COLACE) 100 MG capsule Take 200 mg by mouth daily.   Yes [provider]  lisinopril-hydrochlorothiazide (ZESTORETIC) 20-12.5 MG tablet TAKE 1 TABLET BY MOUTH EVERY DAY Patient taking differently: Take 1 tablet by mouth daily.  03/01/19  Yes Terald Sleeper, PA-C  meclizine (ANTIVERT) 25 MG tablet Take 25 mg by mouth 3 (three) times daily as needed for dizziness.   Yes [provider]  Multiple Vitamin (MULTIVITAMIN WITH MINERALS) TABS tablet Take 1 tablet by mouth  daily.   Yes [provider]  Polyethyl Glycol-Propyl Glycol (SYSTANE ULTRA OP) Place 1 drop into both eyes daily as needed (dry eyes).   Yes [provider]  Powders (ANTI MONKEY BUTT EX) Apply 1 application topically daily as needed (irritation).   Yes [provider]  Zinc 50 MG CAPS Take 50 mg by mouth daily.   Yes [provider]  traMADol (ULTRAM) 50 MG tablet Take 1-2 tablets (50-100 mg total) by mouth every 6 (six) hours as needed for moderate pain. Post-operatively Patient not taking: Reported on 03/16/2019 02/17/19   Alexis Frock, MD    Physical Exam: Constitutional: Moderately built and nourished. Vitals:   03/16/19 2200 03/16/19 2213 03/16/19 2230 03/16/19 2300  BP: (!) 96/52  104/61 106/60  Pulse: 94  88 90  Resp: (!) 23  (!) 21 (!) 25  Temp:      TempSrc:      SpO2: 98%  99% 99%  Weight:  90.7 kg    Height:  5\' 6"  (1.676  m)     Eyes: Anicteric no pallor. ENMT: No discharge from the ears eyes nose or mouth. Neck: No mass felt.  No neck rigidity. Respiratory: No rhonchi or crepitations. Cardiovascular: S1-S2 heard. Abdomen: Soft nontender bowel sounds present.  Urine drained vaccine. Musculoskeletal: No edema. Skin: No rash. Neurologic: Alert awake oriented to time place and person.  Moves all extremities. Psychiatric: Appears normal.   Labs on Admission: I have personally reviewed following labs and imaging studies  CBC: Recent Labs  Lab 03/16/19 1842  WBC 19.6*  NEUTROABS 17.9*  HGB 10.3*  HCT 32.4*  MCV 87.8  PLT 123456   Basic Metabolic Panel: Recent Labs  Lab 03/16/19 1842  NA 134*  K 4.3  CL 100  CO2 22  GLUCOSE 180*  BUN 30*  CREATININE 1.51*  CALCIUM 9.4   GFR: Estimated Creatinine Clearance: 47.3 mL/min (A) (by C-G formula based on SCr of 1.51 mg/dL (H)). Liver Function Tests: Recent Labs  Lab 03/16/19 1842  AST 26  ALT 13  ALKPHOS 73  BILITOT 1.1  PROT 8.2*  ALBUMIN 3.7   No results for input(s): LIPASE, AMYLASE in the last 168 hours. No results for input(s): AMMONIA in the last 168 hours. Coagulation Profile: Recent Labs  Lab 03/16/19 1842  INR 1.2   Cardiac Enzymes: No results for input(s): CKTOTAL, CKMB, CKMBINDEX, TROPONINI in the last 168 hours. BNP (last 3 results) No results for input(s): PROBNP in the last 8760 hours. HbA1C: No results for input(s): HGBA1C in the last 72 hours. CBG: No results for input(s): GLUCAP in the last 168 hours. Lipid Profile: No results for input(s): CHOL, HDL, LDLCALC, TRIG, CHOLHDL, LDLDIRECT in the last 72 hours. Thyroid Function Tests: No results for input(s): TSH, T4TOTAL, FREET4, T3FREE, THYROIDAB in the last 72 hours. Anemia Panel: No results for input(s): VITAMINB12, FOLATE, FERRITIN, TIBC, IRON, RETICCTPCT in the last 72 hours. Urine analysis:    Component Value Date/Time   COLORURINE YELLOW 03/16/2019 1742     APPEARANCEUR CLOUDY (A) 03/16/2019 1742   APPEARANCEUR Cloudy (A) 01/27/2018 1625   LABSPEC 1.015 03/16/2019 1742   PHURINE 6.0 03/16/2019 1742   GLUCOSEU NEGATIVE 03/16/2019 1742   HGBUR MODERATE (A) 03/16/2019 1742   BILIRUBINUR NEGATIVE 03/16/2019 1742   BILIRUBINUR Negative 01/27/2018 1625   KETONESUR NEGATIVE 03/16/2019 1742   PROTEINUR 100 (A) 03/16/2019 1742   NITRITE POSITIVE (A) 03/16/2019 1742   LEUKOCYTESUR LARGE (A) 03/16/2019 1742  Sepsis Labs: @LABRCNTIP (procalcitonin:4,lacticidven:4) ) Recent Results (from the past 240 hour(s))  SARS Coronavirus 2 by RT PCR (hospital order, performed in [facility] hospital lab) Nasopharyngeal Nasopharyngeal Swab     Status: None   Collection Time: 03/16/19 10:11 PM   Specimen: Nasopharyngeal Swab  Result Value Ref Range Status   SARS Coronavirus 2 NEGATIVE NEGATIVE Final    Comment: (NOTE) If result is NEGATIVE SARS-CoV-2 target nucleic acids are NOT DETECTED. The SARS-CoV-2 RNA is generally detectable in upper and lower  respiratory specimens during the acute phase of infection. The lowest  concentration of SARS-CoV-2 viral copies this assay can detect is 250  copies / mL. A negative result does not preclude SARS-CoV-2 infection  and should not be used as the sole basis for treatment or other  patient management decisions.  A negative result may occur with  improper specimen collection / handling, submission of specimen other  than nasopharyngeal swab, presence of viral mutation(s) within the  areas targeted by this assay, and inadequate number of viral copies  (<250 copies / mL). A negative result must be combined with clinical  observations, patient history, and epidemiological information. If result is POSITIVE SARS-CoV-2 target nucleic acids are DETECTED. The SARS-CoV-2 RNA is generally detectable in upper and lower  respiratory specimens dur ing the acute phase of infection.  Positive  results are indicative of  active infection with SARS-CoV-2.  Clinical  correlation with patient history and other diagnostic information is  necessary to determine patient infection status.  Positive results do  not rule out bacterial infection or co-infection with other viruses. If result is PRESUMPTIVE POSTIVE SARS-CoV-2 nucleic acids MAY BE PRESENT.   A presumptive positive result was obtained on the submitted specimen  and confirmed on repeat testing.  While 2019 novel coronavirus  (SARS-CoV-2) nucleic acids may be present in the submitted sample  additional confirmatory testing may be necessary for epidemiological  and / or clinical management purposes  to differentiate between  SARS-CoV-2 and other Sarbecovirus currently known to infect humans.  If clinically indicated additional testing with an alternate test  methodology [phone]) is advised. The SARS-CoV-2 RNA is generally  detectable in upper and lower respiratory sp ecimens during the acute  phase of infection. The expected result is Negative. Fact Sheet for Patients:  StrictlyIdeas.no Fact Sheet for Healthcare Providers: BankingDealers.co.za This test is not yet approved or cleared by the [place] FDA and has been authorized for detection and/or diagnosis of SARS-CoV-2 by FDA under an Emergency Use Authorization (EUA).  This EUA will remain in effect (meaning this test can be used) for the duration of the COVID-19 declaration under Section 564(b)(1) of the Act, 21 U.S.C. section 360bbb-3(b)(1), unless the authorization is terminated or revoked sooner. Performed at [facility], [place] [address], [place], [place] [address]      Radiological Exams on Admission: Dg Chest 2 View  Result Date: 03/16/2019 CLINICAL DATA:  Weakness and fevers, sepsis EXAM: CHEST - 2 VIEW COMPARISON:  01/06/2019 FINDINGS: The heart size and mediastinal contours are within normal limits. Both lungs are  clear. The visualized skeletal structures are unremarkable. Minimal left basilar scarring versus atelectasis. Trachea midline. Bilateral shoulder replacements noted. IMPRESSION: No acute chest process. Electronically Signed   By: Jerilynn Mages.  Shick M.D.   On: 03/16/2019 19:12   Ct Abdomen Pelvis W Contrast  Result Date: 03/16/2019 CLINICAL DATA:  73 year old male with fever of unknown origin. Renal stent placed 3 weeks ago. History of bladder  cancer. EXAM: CT ABDOMEN AND PELVIS WITH CONTRAST TECHNIQUE: Multidetector CT imaging of the abdomen and pelvis was performed using the standard protocol following bolus administration of intravenous contrast. CONTRAST:  162mL OMNIPAQUE IOHEXOL 300 MG/ML  SOLN COMPARISON:  CT Abdomen and Pelvis 01/02/2019. FINDINGS: Lower chest: Stable mild cardiomegaly. Negative lung bases, no effusion. Hepatobiliary: Chronic cholelithiasis with numerous gallstones. No pericholecystic inflammation. Negative liver. No bile duct enlargement. Pancreas: Negative. Spleen: Negative. Adrenals/Urinary Tract: Normal adrenal glands. Negative right kidney and proximal right ureter. Continued left renal inflammation, although partially regressed stranding of the kidney and left pararenal fat since 01/02/2019. Continued abnormal enhancement of the left renal cortex, and on delayed images no left renal contrast excretion is identified. Superimposed left nephroureteral stent has been placed. The distal aspect of the stent is discussed below. No adverse features are identified at the left kidney or the skin site. Surgically absent bladder. Stomach/Bowel: Diverticulosis from the sigmoid colon into the descending colon. Large bowel retained stool and redundancy. No large bowel inflammation. Prior appendectomy (coronal image 67. Negative terminal ileum. Just upstream of the TI there is a small bowel anastomosis with no adverse features identified. There are no dilated loops of small bowel. An ileal conduit exits the  skin in the right abdomen on series 2, image 49. A short distance upstream of the ileostomy loop (series 2, image 54) a ureteral stent terminates. This is an internal-external stent, which traverses the kidney as additional detailed above. Negative stomach.  No free air.  No free fluid. Vascular/Lymphatic: Major arterial structures are patent. Mild Aortoiliac calcified atherosclerosis. Portal venous system appears to be patent. No lymphadenopathy. Reproductive: Stable appearance of prior cystoprostatectomy. Small fat containing left inguinal hernia. Other: No pelvic free fluid. Musculoskeletal: Degenerative changes in the spine. No acute or suspicious osseous lesion. IMPRESSION: 1. Continued inflammation of the left kidney compatible with pyelonephritis, although the degree of left renal and pararenal stranding is perhaps mildly improved since August. A left nephroureteral stent has been placed. The distal aspect of the stent travels within the ileal conduit and terminates a several centimeters upstream of ileostomy, with no adverse features. 2. Right kidney remains normal. Stable appearance of cystoprostatectomy. 3. No other acute or inflammatory process. Cholelithiasis. Large bowel diverticulosis and retained stool. Electronically Signed   By: Genevie Ann M.D.   On: 03/16/2019 22:22    EKG: Independently reviewed.  Normal sinus rhythm.  Assessment/Plan Principal Problem:   Sepsis ([place]) Active Problems:   HTN (hypertension)   Malignant neoplasm of urinary bladder (HCC)   Acute pyelonephritis    1. Sepsis secondary to pyelonephritis -blood cultures obtained along with urine cultures continue with IV fluids and and empiric antibiotics for now.  Note that patient had Pseudomonas bacteremia in August 2020.  Patient does have a stent in the left kidney may notify Dr. Tresa Moore patient's urologist in the morning. 2. History of hypertension presented with low normal blood pressure will hold patient's lisinopri  Hydrochlorothiazide for now and follow blood pressure trends. 3. History of recurrent high-grade bladder cancer status post radical cystoprostatectomy followed by Dr. Tresa Moore urologist.  Please notify Dr. Tammi Klippel. 4. Anemia appears to be chronic.  Follow CBC. 5. Acute renal failure likely from sepsis holding lisinopril hydrochlorothiazide follow metabolic panel closely.  Given the septic picture patient will need more than 2 midnight stay in inpatient status.   DVT prophylaxis: Heparin. Code Status: Full code. Family Communication: Discussed with patient. Disposition Plan: Home. Consults called: None. Admission status: Inpatient.  Rise Patience MD [facility] Pager [phone].  If 7PM-7AM, please contact night-coverage [email or web address] Password [email or web address]  03/16/2019, 11:27 PM

## 2019-03-16 NOTE — ED Provider Notes (Signed)
[facility] DEPT Provider Note   CSN: [id number] Arrival date & time: 03/16/19  1655     History   Chief Complaint Chief Complaint  Patient presents with  . Fever  . Fatigue    HPI Billy Rasmussen is a 73 y.o. male with an extensive past medical history most significant for bladder cancer status post radical cystoprostatectomy on 11/09/2018 with ileal conduit and resection of the lymph node, Hx of gastric ulcer, GERD, and hypertension who presents to the ED due to sudden onset of intermittent fever and chills for the past day. Patient admits that he has been fatigued for the past week. Fatigue is associated with weakness and lightheadedness. Patient has been taking Tylenol for his fever with symptomatic relief for a few hours. Patient had a left antegrade ureteroscopy with balloon dilation and stent placement on 9/18 and has been hospitalized numerous times due to pyelonephritis. Patient denies changes to his urination, abdominal pain, infection at site of urostomy bag, flank pain, chest pain, and shortness of breath.  Past Medical History:  Diagnosis Date  . Arthritis    knees, shoulders, elbows  . Bladder cancer [facility])    urologist-  dr Junious Silk  . Diverticulosis of colon   . Fibromyalgia   . GERD (gastroesophageal reflux disease)   . History of diverticulitis of colon   . History of gastric ulcer    due to aleve  . Hypertension   . Lower urinary tract symptoms (LUTS)   . OSA (obstructive sleep apnea)    NON-COMPLIANT  CPAP  --- BUT PT USES OXYGEN AT NIGHT 2.5L VIA [place] (PT'S DECISION)  . PONV (postoperative nausea and vomiting)   . Psoriasis   . Tinnitus    right ear more, has tranmitter in right ear removable at hs  . Wears glasses   . Wears partial dentures     Patient Active Problem List   Diagnosis Date Noted  . Sepsis due to urinary tract infection ([place]) 02/17/2019  . Abnormal renal function 01/20/2019  . Enterococcus UTI   . Urinary  tract infection without hematuria   . Acute pyelonephritis   . Gastroesophageal reflux disease   . Sepsis ([place]) 12/22/2018  . AKI (acute kidney injury) ([place]) 12/22/2018  . Bladder cancer ([place]) 11/09/2018  . 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 Acute cystitis with hematuria 10/02/2016  . Pure hypercholesterolemia 10/02/2016  . Malignant neoplasm of urinary bladder ([place]) 10/02/2016  . Lipoma 09/30/2016  . Obese 03/31/2016  . S/P right TKA 03/30/2016  . S/P knee replacement 03/30/2016  . HTN (hypertension) 02/25/2016  . Healthcare maintenance 02/25/2016  . Obesity (BMI 30-39.9) 01/07/2016  . S/P shoulder replacement 07/20/2013  . Shoulder arthritis 04/07/2013  . CELLULITIS, KNEE, LEFT 08/26/2006    Past Surgical History:  Procedure Laterality Date  . APPENDECTOMY    . COLECTOMY W/ COLOSTOMY  1996   W/   APPENDECTOMY  . COLONOSCOPY N/A 05/11/2018   Procedure: COLONOSCOPY;  Surgeon: Daneil Dolin, MD;  [facility];  Service: Endoscopy;  Laterality: N/A;  9:30  . COLOSTOMY TAKEDOWN  1996  . CYSTOSCOPY WITH BIOPSY N/A 12/05/2013   Procedure: CYSTO BLADDER BIOPSY AND FULGERATION;  Surgeon: Festus Aloe, MD;  Location[facility];  Service: Urology;  Laterality: N/A;  . CYSTOSCOPY WITH BIOPSY Bilateral 11/13/2014   Procedure: CYSTOSCOPY WITH  BLADDER BIOPSY FULGERATION AND BILATERAL RETROGRADE PYELOGRAMS;  Surgeon: Festus Aloe, MD;  Location[facility];  Service:  Urology;  Laterality: Bilateral;  . CYSTOSCOPY WITH FULGERATION N/A 01/18/2018   Procedure: CYSTOSCOPY WITH FULGERATION/ BLADDER BIOPSY;  Surgeon: Festus Aloe, MD;  Location[facility];  Service: Urology;  Laterality: N/A;  . CYSTOSCOPY WITH INJECTION N/A 11/09/2018   Procedure: CYSTOSCOPY WITH INJECTION OF INDOCYANINE GREEN DYE;  Surgeon: Alexis Frock, MD;  Location: [facility];  Service: Urology;  Laterality: N/A;  . CYSTOSCOPY WITH INSERTION OF UROLIFT N/A  01/18/2018   Procedure: CYSTOSCOPY WITH INSERTION OF UROLIFT;  Surgeon: Festus Aloe, MD;  Location: [facility];  Service: Urology;  Laterality: N/A;  . CYSTOSCOPY/URETEROSCOPY/HOLMIUM LASER Left 02/17/2019   Procedure: URETEROSCOPY WITH BALLOON DILATION  AND NEPHROSTOGRAM;  Surgeon: Alexis Frock, MD;  Location: [facility];  Service: Urology;  Laterality: Left;  1 HR  . EXCISION RIGHT UPPER ARM LIPOMA  2005  . HEMORROIDECTOMY    . INGUINAL HERNIA REPAIR Left 1984  . IR NEPHROSTOMY PLACEMENT LEFT  01/05/2019  . KNEE ARTHROSCOPY Left X3  LAST ONE  2002  . ORIF LEFT HUMEROUS FX  1976  . POLYPECTOMY  05/11/2018   Procedure: POLYPECTOMY;  Surgeon: Daneil Dolin, MD;  [facility];  Service: Endoscopy;;  . PROSTATE SURGERY    . TONSILLECTOMY  AS CHILD  . TOTAL KNEE ARTHROPLASTY Left 2006   REVISION 2007  (AFTER I & D WITH ANTIBIOTIC SPACER PROCEDURE FOR STEPH INFECTION)  . TOTAL KNEE ARTHROPLASTY Right 03/30/2016   Procedure: RIGHT TOTAL KNEE ARTHROPLASTY;  Surgeon: Paralee Cancel, MD;  Location: [facility];  Service: Orthopedics;  Laterality: Right;  . TOTAL SHOULDER ARTHROPLASTY Right 04/06/2013   Procedure: RIGHT TOTAL SHOULDER ARTHROPLASTY;  Surgeon: Marin Shutter, MD;  Location: [place];  Service: Orthopedics;  Laterality: Right;  . TOTAL SHOULDER ARTHROPLASTY Left 07/20/2013   Procedure: LEFT TOTAL SHOULDER ARTHROPLASTY;  Surgeon: Marin Shutter, MD;  Location: [facility];  Service: Orthopedics;  Laterality: Left;  . TRANSURETHRAL RESECTION OF BLADDER TUMOR N/A 11/12/2015   Procedure: TRANSURETHRAL RESECTION OF BLADDER TUMOR (TURBT);  Surgeon: Festus Aloe, MD;  Location: [facility];  Service: Urology;  Laterality: N/A;  . TRANSURETHRAL RESECTION OF BLADDER TUMOR WITH GYRUS (TURBT-GYRUS) N/A 12/05/2013   Procedure: TRANSURETHRAL RESECTION OF BLADDER TUMOR WITH GYRUS (TURBT-GYRUS);  Surgeon: Festus Aloe, MD;  Location: [facility];  Service: Urology;  Laterality: N/A;        Home Medications    Prior to Admission medications   Medication Sig Start Date End Date Taking? Authorizing Provider  Calcium Carb-Cholecalciferol (CALCIUM 600 + D PO) Take 1 tablet by mouth daily.    [provider]  diclofenac (VOLTAREN) 75 MG EC tablet TAKE 1 TABLET BY MOUTH TWICE A DAY 02/08/19   Terald Sleeper, PA-C  diphenhydrAMINE (BENADRYL) 25 MG tablet Take 25 mg by mouth daily as needed for itching.    [provider]  docusate sodium (COLACE) 100 MG capsule Take 200 mg by mouth daily.    [provider]  lisinopril-hydrochlorothiazide (ZESTORETIC) 20-12.5 MG tablet TAKE 1 TABLET BY MOUTH EVERY DAY 03/01/19   Terald Sleeper, PA-C  meclizine (ANTIVERT) 25 MG tablet Take 25 mg by mouth 3 (three) times daily as needed for dizziness.    [provider]  Multiple Vitamin (MULTIVITAMIN WITH MINERALS) TABS tablet Take 1 tablet by mouth daily.    [provider]  Polyethyl Glycol-Propyl Glycol (SYSTANE ULTRA OP) Place 1 drop into both eyes daily as  needed (dry eyes).    [provider]  Powders (ANTI MONKEY BUTT EX) Apply 1 application topically daily as needed (irritation).    [provider]  Simethicone (GAS-X PO) Take 1 tablet by mouth daily as needed (gas).    [provider]  traMADol (ULTRAM) 50 MG tablet Take 1-2 tablets (50-100 mg total) by mouth every 6 (six) hours as needed for moderate pain. Post-operatively 02/17/19   Alexis Frock, MD  Zinc 50 MG CAPS Take 50 mg by mouth daily.    [provider]    Family History Family History  Problem Relation Age of Onset  . Alzheimer's disease Mother   . Heart attack Father   . Heart disease Father   . Irregular heart beat Brother        DEFIB.  . Congestive Heart Failure Brother   . Diabetes Daughter     Social History Social History   Tobacco Use  . Smoking status: Former Smoker     Packs/day: 2.00    Years: 40.00    Pack years: 80.00    Types: Cigarettes    Quit date: 06/01/1997    Years since quitting: 21.8  . Smokeless tobacco: Never Used  Substance Use Topics  . Alcohol use: No  . Drug use: No     Allergies   Patient has no known allergies.   Review of Systems Review of Systems  Constitutional: Positive for activity change, chills, fatigue and fever.  Respiratory: Negative for shortness of breath.   Cardiovascular: Negative for chest pain.  Gastrointestinal: Negative for abdominal pain, diarrhea, nausea and vomiting.  Genitourinary: Negative for decreased urine volume and flank pain.  All other systems reviewed and are negative.    Physical Exam Updated Vital Signs BP 120/66 (BP Location: Right Arm)   Pulse (!) 127   Temp (!) 100.7 F (38.2 C) (Oral)   Resp 20   SpO2 95%   Physical Exam Constitutional:      General: He is not in acute distress. HENT:     Head: Normocephalic.  Eyes:     Pupils: Pupils are equal, round, and reactive to light.  Neck:     Musculoskeletal: Neck supple.  Cardiovascular:     Rate and Rhythm: Regular rhythm. Tachycardia present.     Pulses: Normal pulses.     Heart sounds: Normal heart sounds. No murmur. No friction rub. No gallop.   Pulmonary:     Effort: Pulmonary effort is normal.     Breath sounds: Normal breath sounds. No stridor. No wheezing or rales.  Abdominal:     General: Abdomen is flat. Bowel sounds are normal. There is no distension.     Palpations: Abdomen is soft.     Tenderness: There is no abdominal tenderness. There is no right CVA tenderness, left CVA tenderness, guarding or rebound.     Comments: Urostomy bag in RLQ. No sign erythema, edema, or drainage from bag. No signs of infection  Musculoskeletal: Normal range of motion.  Skin:    General: Skin is warm.  Neurological:     General: No focal deficit present.     Mental Status: He is alert.      ED Treatments / Results  Labs  (all labs ordered are listed, but only abnormal results are displayed) Labs Reviewed  COMPREHENSIVE METABOLIC PANEL - Abnormal; Notable for the following components:      Result Value   Sodium 134 (*)    Glucose, Bld  180 (*)    BUN 30 (*)    Creatinine, Ser 1.51 (*)    Total Protein 8.2 (*)    GFR calc non Af Amer 46 (*)    GFR calc Af Amer 53 (*)    All other components within normal limits  CBC WITH DIFFERENTIAL/PLATELET - Abnormal; Notable for the following components:   WBC 19.6 (*)    RBC 3.69 (*)    Hemoglobin 10.3 (*)    HCT 32.4 (*)    RDW 17.2 (*)    Neutro Abs 17.9 (*)    Lymphs Abs 0.5 (*)    Abs Immature Granulocytes 0.12 (*)    All other components within normal limits  PROTIME-INR - Abnormal; Notable for the following components:   Prothrombin Time 15.4 (*)    All other components within normal limits  CULTURE, BLOOD (ROUTINE X 2)  CULTURE, BLOOD (ROUTINE X 2)  URINE CULTURE  SARS CORONAVIRUS 2 BY RT PCR (HOSPITAL ORDER, [facility])  LACTIC ACID, PLASMA  LACTIC ACID, PLASMA  URINALYSIS, ROUTINE W REFLEX MICROSCOPIC  APTT    EKG None  Radiology Dg Chest 2 View  Result Date: 03/16/2019 CLINICAL DATA:  Weakness and fevers, sepsis EXAM: CHEST - 2 VIEW COMPARISON:  01/06/2019 FINDINGS: The heart size and mediastinal contours are within normal limits. Both lungs are clear. The visualized skeletal structures are unremarkable. Minimal left basilar scarring versus atelectasis. Trachea midline. Bilateral shoulder replacements noted. IMPRESSION: No acute chest process. Electronically Signed   By: Jerilynn Mages.  Shick M.D.   On: 03/16/2019 19:12    Procedures .Critical Care Performed by: Jonette Eva, PA-C Authorized by: Jonette Eva, PA-C   Critical care provider statement:    Critical care time (minutes):  30   Critical care time was exclusive of:  Separately billable procedures and treating other patients and teaching time    Critical care was necessary to treat or prevent imminent or life-threatening deterioration of the following conditions:  Sepsis   Critical care was time spent personally by me on the following activities:  Discussions with consultants, evaluation of patient's response to treatment, examination of patient, ordering and performing treatments and interventions, ordering and review of laboratory studies, ordering and review of radiographic studies, pulse oximetry, re-evaluation of patient's condition, obtaining history from patient or surrogate and review of old charts   I assumed direction of critical care for this patient from another provider in my specialty: yes     (including critical care time)  Medications Ordered in ED Medications  sodium chloride 0.9 % bolus 2,000 mL (has no administration in time range)  cefTRIAXone (ROCEPHIN) 1 g in sodium chloride 0.9 % 100 mL IVPB (has no administration in time range)  acetaminophen (TYLENOL) tablet 650 mg (has no administration in time range)     Initial Impression / Assessment and Plan / ED Course  I have reviewed the triage vital signs and the nursing notes.  Pertinent labs & imaging results that were available during my care of the patient were reviewed by me and considered in my medical decision making (see chart for details).       Billy Rasmussen is a 73 year old male with an extensive medical history most significant for bladder cancer status post radical cystoprostatectomy on 11/09/2018 with ileal conduit and resection of the lymph node who presents to the ED for an evaluation of fever and chills for the past day. Patient had a left antegrade  ureteroscopy with balloon dilation and stent placement on 9/18. Patient has been treated numerous times for pyelonephritis over the past few months with numerous hospital stays. Upon arrival, patient is febrile at 100.7 and tachycardic at 127. On physical exam, patient is in no acute distress and non-toxic  appearing. Patient appears relatively well. Abdomen is soft, non-distended, non-tender with a urostomy bag in RLQ with no signs of infection. No CVA tenderness bilaterally. Code sepsis called. Will order sepsis order set. Will add CT of abdomen and pelvis due to history of bladder cancer and numerous episodes of pyelonephritis. Tylenol for fever.   CBC significant for leukocytosis of 19.6, baseline hemoglobin. UA positive for nitrites, leukocytes, and Hgb. Suspect patient has UTI vs. Pylonephritis given patient's history and recent infections. CXR negative for signs of infection. Lactic acid 1.9. Covid test negative. CT of abdomen and pelvis demonstrated continued inflammation of left kidney, suggestive of pyelonephritis. Incidental finding of large bowel diverticulosis and retained stool. Britini Henderly, PA-C consulted Dr. Hal Hope with [facility] for admission who will evaluate patient.   Final Clinical Impressions(s) / ED Diagnoses   Final diagnoses:  None    ED Discharge Orders    None       Romie Levee 03/16/19 2329    M[place] [facility], MD 03/18/19 1010

## 2019-03-17 DIAGNOSIS — R6520 Severe sepsis without septic shock: Secondary | ICD-10-CM

## 2019-03-17 DIAGNOSIS — A419 Sepsis, unspecified organism: Secondary | ICD-10-CM

## 2019-03-17 DIAGNOSIS — G9341 Metabolic encephalopathy: Secondary | ICD-10-CM

## 2019-03-17 DIAGNOSIS — A4152 Sepsis due to Pseudomonas: Secondary | ICD-10-CM

## 2019-03-17 DIAGNOSIS — N179 Acute kidney failure, unspecified: Secondary | ICD-10-CM

## 2019-03-17 LAB — CBC WITH DIFFERENTIAL/PLATELET
Abs Immature Granulocytes: 0.45 10*3/uL — ABNORMAL HIGH (ref 0.00–0.07)
Basophils Absolute: 0 10*3/uL (ref 0.0–0.1)
Basophils Relative: 0 %
Eosinophils Absolute: 0.1 10*3/uL (ref 0.0–0.5)
Eosinophils Relative: 0 %
HCT: 26.6 % — ABNORMAL LOW (ref 39.0–52.0)
Hemoglobin: 8.4 g/dL — ABNORMAL LOW (ref 13.0–17.0)
Immature Granulocytes: 2 %
Lymphocytes Relative: 3 %
Lymphs Abs: 0.6 10*3/uL — ABNORMAL LOW (ref 0.7–4.0)
MCH: 28.3 pg (ref 26.0–34.0)
MCHC: 31.6 g/dL (ref 30.0–36.0)
MCV: 89.6 fL (ref 80.0–100.0)
Monocytes Absolute: 1.5 10*3/uL — ABNORMAL HIGH (ref 0.1–1.0)
Monocytes Relative: 8 %
Neutro Abs: 16.2 10*3/uL — ABNORMAL HIGH (ref 1.7–7.7)
Neutrophils Relative %: 87 %
Platelets: 175 10*3/uL (ref 150–400)
RBC: 2.97 MIL/uL — ABNORMAL LOW (ref 4.22–5.81)
RDW: 17.4 % — ABNORMAL HIGH (ref 11.5–15.5)
WBC: 18.9 10*3/uL — ABNORMAL HIGH (ref 4.0–10.5)
nRBC: 0 % (ref 0.0–0.2)

## 2019-03-17 LAB — COMPREHENSIVE METABOLIC PANEL WITH GFR
ALT: 8 U/L (ref 0–44)
AST: 17 U/L (ref 15–41)
Albumin: 2.6 g/dL — ABNORMAL LOW (ref 3.5–5.0)
Alkaline Phosphatase: 52 U/L (ref 38–126)
Anion gap: 7 (ref 5–15)
BUN: 25 mg/dL — ABNORMAL HIGH (ref 8–23)
CO2: 21 mmol/L — ABNORMAL LOW (ref 22–32)
Calcium: 7.7 mg/dL — ABNORMAL LOW (ref 8.9–10.3)
Chloride: 109 mmol/L (ref 98–111)
Creatinine, Ser: 1.36 mg/dL — ABNORMAL HIGH (ref 0.61–1.24)
GFR calc Af Amer: >60 mL/min
GFR calc non Af Amer: 52 mL/min — ABNORMAL LOW
Glucose, Bld: 94 mg/dL (ref 70–99)
Potassium: 3.6 mmol/L (ref 3.5–5.1)
Sodium: 137 mmol/L (ref 135–145)
Total Bilirubin: 0.5 mg/dL (ref 0.3–1.2)
Total Protein: 6 g/dL — ABNORMAL LOW (ref 6.5–8.1)

## 2019-03-17 LAB — CBC
HCT: 27.5 % — ABNORMAL LOW (ref 39.0–52.0)
Hemoglobin: 8.5 g/dL — ABNORMAL LOW (ref 13.0–17.0)
MCH: 27.3 pg (ref 26.0–34.0)
MCHC: 30.9 g/dL (ref 30.0–36.0)
MCV: 88.4 fL (ref 80.0–100.0)
Platelets: 174 10*3/uL (ref 150–400)
RBC: 3.11 MIL/uL — ABNORMAL LOW (ref 4.22–5.81)
RDW: 17.3 % — ABNORMAL HIGH (ref 11.5–15.5)
WBC: 18.1 10*3/uL — ABNORMAL HIGH (ref 4.0–10.5)
nRBC: 0 % (ref 0.0–0.2)

## 2019-03-17 LAB — CREATININE, SERUM
Creatinine, Ser: 1.19 mg/dL (ref 0.61–1.24)
GFR calc Af Amer: >60 mL/min
GFR calc non Af Amer: >60 mL/min

## 2019-03-17 LAB — APTT: aPTT: 39 seconds — ABNORMAL HIGH (ref 24–36)

## 2019-03-17 MED ORDER — SODIUM CHLORIDE 0.9 % IV SOLN
2.0000 g | Freq: Two times a day (BID) | INTRAVENOUS | Status: DC
  Administered 2019-03-17 – 2019-03-18 (×3): 2 g via INTRAVENOUS
  Filled 2019-03-17 (×4): qty 2

## 2019-03-17 NOTE — Progress Notes (Signed)
MEWS RED Temp of 102.7 oral and HR 113. Dr. Clementeen Graham paged, was instructed to givenTylenol.  Will contiune to monitor patient.

## 2019-03-17 NOTE — Progress Notes (Signed)
Pharmacy Antibiotic Note  Billy Rasmussen is a 73 y.o. male admitted on 03/16/2019 with sepsis, pyelonephritis.  Pharmacy has been consulted for Cefepime dosing.  Plan: Cefepime 2gm q12  Height: 5\' 6"  (167.6 cm) Weight: 200 lb (90.7 kg) IBW/kg (Calculated) : 63.8  Temp (24hrs), Avg:100.7 F (38.2 C), Min:100.7 F (38.2 C), Max:100.7 F (38.2 C)  Recent Labs  Lab 03/16/19 1842 03/16/19 1851 03/17/19 0057 03/17/19 0551  WBC 19.6*  --  18.1* 18.9*  CREATININE 1.51*  --  1.19  --   LATICACIDVEN  --  1.9  --   --     Estimated Creatinine Clearance: 60.1 mL/min (by C-G formula based on SCr of 1.19 mg/dL).    No Known Allergies  Antimicrobials this admission: 10/15 Ceftriaxone x1 10/16 Cefepime >>   Dose adjustments this admission:   Microbiology results: 10/15 BCx: x 1 set, sent 10/15 UCx: sent   Thank you for allowing pharmacy to be a part of this patient's care.  Minda Ditto PharmD 03/17/2019 6:40 AM

## 2019-03-17 NOTE — ED Notes (Signed)
Attempted to call report for this patient.

## 2019-03-17 NOTE — ED Notes (Signed)
Pt given breakfast tray

## 2019-03-17 NOTE — Consult Note (Signed)
Reason for Consult:Bladder Caner, Pyelonephritis.   Referring Physician: Artist Beach MD  Billy Rasmussen is an 73 y.o. male.   HPI:   1 - Recurrent High Grade Bladder Cancer - s/p robotic cystoprostatectomy with ICG sentinal + template lymphadenctomy, extensive adhesions, and conduit diversion 10/2018 for [id number] cancer with NEGATIVE margins. BCG refractory disease prior. Urethra remains in site.    2 -Recurrent Pyelonephritis - treated 11/2018 and again 12/2018, 01/2019, and now 03/2019 for suspect pyelo on eval fevers and malaise. UCX 12/2018 pseudomonas pan-sensitive (cipro, gent). Billy Rasmussen 03/16/19 pending, placed on empiric Cefipime.   3 -  Left Hydronephrosis Distal Partial Ureteral Stricture- mild left hydro on ER CT 12/2018 after cystoprostatectomy. Pyelo at time and therefore left neph tube placed. Antegrade ureteroscopy confirmed left distal partial stricture (long segemnt, appears to be from retrocolic tunnel to conduit, not anastamotic) and balloon dilated / antegrade stent placed which remains in place an in good positoin on CT this admission 10/5.   PMH sig for obesity, partial colectomy / colostomy / colostomy take down for diverticulitis mid 1990s (pre-cystectomy) , bilateral total knee, bilateral shoulder surgery, lipoma removal x several. His PCP is Particia Nearing PA.   Today " Billy Rasmussen " is seen in consultation for yet another episode of pyelonephritis. He has malaise x 2 days, then fever to 101 just prior to admit.   Past Medical History:  Diagnosis Date  . Arthritis    knees, shoulders, elbows  . Bladder cancer [facility])    urologist-  dr Junious Silk  . Diverticulosis of colon   . Fibromyalgia   . GERD (gastroesophageal reflux disease)   . History of diverticulitis of colon   . History of gastric ulcer    due to aleve  . Hypertension   . Lower urinary tract symptoms (LUTS)   . OSA (obstructive sleep apnea)    NON-COMPLIANT  CPAP  --- BUT PT USES OXYGEN AT NIGHT 2.5L VIA [place]  (PT'S DECISION)  . PONV (postoperative nausea and vomiting)   . Psoriasis   . Tinnitus    right ear more, has tranmitter in right ear removable at hs  . Wears glasses   . Wears partial dentures     Past Surgical History:  Procedure Laterality Date  . APPENDECTOMY    . COLECTOMY W/ COLOSTOMY  1996   W/   APPENDECTOMY  . COLONOSCOPY N/A 05/11/2018   Procedure: COLONOSCOPY;  Surgeon: Daneil Dolin, MD;  [facility];  Service: Endoscopy;  Laterality: N/A;  9:30  . COLOSTOMY TAKEDOWN  1996  . CYSTOSCOPY WITH BIOPSY N/A 12/05/2013   Procedure: CYSTO BLADDER BIOPSY AND FULGERATION;  Surgeon: Festus Aloe, MD;  Location: [facility];  Service: Urology;  Laterality: N/A;  . CYSTOSCOPY WITH BIOPSY Bilateral 11/13/2014   Procedure: CYSTOSCOPY WITH  BLADDER BIOPSY FULGERATION AND BILATERAL RETROGRADE PYELOGRAMS;  Surgeon: Festus Aloe, MD;  Location: [facility];  Service: Urology;  Laterality: Bilateral;  . CYSTOSCOPY WITH FULGERATION N/A 01/18/2018   Procedure: CYSTOSCOPY WITH FULGERATION/ BLADDER BIOPSY;  Surgeon: Festus Aloe, MD;  Location: [facility];  Service: Urology;  Laterality: N/A;  . CYSTOSCOPY WITH INJECTION N/A 11/09/2018   Procedure: CYSTOSCOPY WITH INJECTION OF INDOCYANINE GREEN DYE;  Surgeon: Alexis Frock, MD;  [facility];  Service: Urology;  Laterality: N/A;  . CYSTOSCOPY WITH INSERTION OF UROLIFT N/A 01/18/2018   Procedure: CYSTOSCOPY WITH INSERTION OF UROLIFT;  Surgeon: Festus Aloe, MD;  Location: [facility];  Service: Urology;  Laterality: N/A;  . CYSTOSCOPY/URETEROSCOPY/HOLMIUM LASER Left 02/17/2019   Procedure: URETEROSCOPY WITH BALLOON DILATION  AND NEPHROSTOGRAM;  Surgeon: Alexis Frock, MD;  Location: [facility];  Service: Urology;  Laterality: Left;  1 HR  . EXCISION RIGHT UPPER ARM LIPOMA  2005  . HEMORROIDECTOMY    . INGUINAL HERNIA REPAIR Left 1984  .  IR NEPHROSTOMY PLACEMENT LEFT  01/05/2019  . KNEE ARTHROSCOPY Left X3  LAST ONE  2002  . ORIF LEFT HUMEROUS FX  1976  . POLYPECTOMY  05/11/2018   Procedure: POLYPECTOMY;  Surgeon: Daneil Dolin, MD;  [facility];  Service: Endoscopy;;  . PROSTATE SURGERY    . TONSILLECTOMY  AS CHILD  . TOTAL KNEE ARTHROPLASTY Left 2006   REVISION 2007  (AFTER I & D WITH ANTIBIOTIC SPACER PROCEDURE FOR STEPH INFECTION)  . TOTAL KNEE ARTHROPLASTY Right 03/30/2016   Procedure: RIGHT TOTAL KNEE ARTHROPLASTY;  Surgeon: Paralee Cancel, MD;  [facility];  Service: Orthopedics;  Laterality: Right;  . TOTAL SHOULDER ARTHROPLASTY Right 04/06/2013   Procedure: RIGHT TOTAL SHOULDER ARTHROPLASTY;  Surgeon: Marin Shutter, MD;  Location: [place];  Service: Orthopedics;  Laterality: Right;  . TOTAL SHOULDER ARTHROPLASTY Left 07/20/2013   Procedure: LEFT TOTAL SHOULDER ARTHROPLASTY;  Surgeon: Marin Shutter, MD;  Location: [facility];  Service: Orthopedics;  Laterality: Left;  . TRANSURETHRAL RESECTION OF BLADDER TUMOR N/A 11/12/2015   Procedure: TRANSURETHRAL RESECTION OF BLADDER TUMOR (TURBT);  Surgeon: Festus Aloe, MD;  Location: [facility];  Service: Urology;  Laterality: N/A;  . TRANSURETHRAL RESECTION OF BLADDER TUMOR WITH GYRUS (TURBT-GYRUS) N/A 12/05/2013   Procedure: TRANSURETHRAL RESECTION OF BLADDER TUMOR WITH GYRUS (TURBT-GYRUS);  Surgeon: Festus Aloe, MD;  Location: [facility];  Service: Urology;  Laterality: N/A;    Family History  Problem Relation Age of Onset  . Alzheimer's disease Mother   . Heart attack Father   . Heart disease Father   . Irregular heart beat Brother        DEFIB.  . Congestive Heart Failure Brother   . Diabetes Daughter     Social History:  reports that he quit smoking about 21 years ago. His smoking use included cigarettes. He has a 80.00 pack-year smoking history. He has never used smokeless tobacco. He reports that he does not drink  alcohol or use drugs.  Allergies: No Known Allergies  Medications: I have reviewed the patient's current medications.  Results for orders placed or performed during the hospital encounter of 03/16/19 (from the past 48 hour(s))  Urinalysis, Routine w reflex microscopic     Status: Abnormal   Collection Time: 03/16/19  5:42 PM  Result Value Ref Range   Color, Urine YELLOW YELLOW   APPearance CLOUDY (A) CLEAR   Specific Gravity, Urine 1.015 1.005 - 1.030   pH 6.0 5.0 - 8.0   Glucose, UA NEGATIVE NEGATIVE mg/dL   Hgb urine dipstick MODERATE (A) NEGATIVE   Bilirubin Urine NEGATIVE NEGATIVE   Ketones, ur NEGATIVE NEGATIVE mg/dL   Protein, ur 100 (A) NEGATIVE mg/dL   Nitrite POSITIVE (A) NEGATIVE   Leukocytes,Ua LARGE (A) NEGATIVE   RBC / HPF 21-50 0 - 5 RBC/hpf   WBC, UA >50 (H) 0 - 5 WBC/hpf   Bacteria, UA RARE (A) NONE SEEN   Mucus PRESENT     Comment: Performed at [facility], [place] [address], [place], [place] [address]  Culture,  blood (Routine x 2)     Status: None (Preliminary result)   Collection Time: 03/16/19  6:13 PM   Specimen: BLOOD  Result Value Ref Range   Specimen Description      BLOOD LEFT ANTECUBITAL Performed at [facility], [place] [address] [place], [place] [address]    Special Requests      BOTTLES DRAWN AEROBIC AND ANAEROBIC Blood Culture adequate volume Performed at [place] [address], [place], [place] [address]    Culture      NO GROWTH < 24 HOURS Performed at [place] [address], [place], [place] [address]    Report Status PENDING   Comprehensive metabolic panel     Status: Abnormal   Collection Time: 03/16/19  6:42 PM  Result Value Ref Range   Sodium 134 (L) 135 - 145 mmol/L   Potassium 4.3 3.5 - 5.1 mmol/L   Chloride 100 98 - 111 mmol/L   CO2 22 22 - 32 mmol/L   Glucose, Bld 180 (H) 70 - 99 mg/dL   BUN 30 (H) 8 - 23 mg/dL   Creatinine, Ser 1.51 (H) 0.61 - 1.24  mg/dL   Calcium 9.4 8.9 - 10.3 mg/dL   Total Protein 8.2 (H) 6.5 - 8.1 g/dL   Albumin 3.7 3.5 - 5.0 g/dL   AST 26 15 - 41 U/L   ALT 13 0 - 44 U/L   Alkaline Phosphatase 73 38 - 126 U/L   Total Bilirubin 1.1 0.3 - 1.2 mg/dL   GFR calc non Af Amer 46 (L) >60 mL/min   GFR calc Af Amer 53 (L) >60 mL/min   Anion gap 12 5 - 15    Comment: Performed at [facility], [place] [address] [place], [place] [address]  CBC with Differential     Status: Abnormal   Collection Time: 03/16/19  6:42 PM  Result Value Ref Range   WBC 19.6 (H) 4.0 - 10.5 K/uL   RBC 3.69 (L) 4.22 - 5.81 MIL/uL   Hemoglobin 10.3 (L) 13.0 - 17.0 g/dL   HCT 32.4 (L) 39.0 - 52.0 %   MCV 87.8 80.0 - 100.0 fL   MCH 27.9 26.0 - 34.0 pg   MCHC 31.8 30.0 - 36.0 g/dL   RDW 17.2 (H) 11.5 - 15.5 %   Platelets 256 150 - 400 K/uL   nRBC 0.0 0.0 - 0.2 %   Neutrophils Relative % 91 %   Neutro Abs 17.9 (H) 1.7 - 7.7 K/uL   Lymphocytes Relative 3 %   Lymphs Abs 0.5 (L) 0.7 - 4.0 K/uL   Monocytes Relative 5 %   Monocytes Absolute 1.0 0.1 - 1.0 K/uL   Eosinophils Relative 0 %   Eosinophils Absolute 0.0 0.0 - 0.5 K/uL   Basophils Relative 0 %   Basophils Absolute 0.0 0.0 - 0.1 K/uL   Immature Granulocytes 1 %   Abs Immature Granulocytes 0.12 (H) 0.00 - 0.07 K/uL    Comment: Performed at [facility], [place] [address], [place], [place] [address]  Protime-INR     Status: Abnormal   Collection Time: 03/16/19  6:42 PM  Result Value Ref Range   Prothrombin Time 15.4 (H) 11.4 - 15.2 seconds   INR 1.2 0.8 - 1.2    Comment: (NOTE) INR goal varies based on device and disease states. Performed at [facility], [place] [address], [place],  [address]   Lactic  acid, plasma     Status: None   Collection Time: 03/16/19  6:51 PM  Result Value Ref Range   Lactic Acid, Venous 1.9 0.5 - 1.9 mmol/L    Comment: Performed at [facility], [place] [address], [place],  [place] [address]  SARS Coronavirus 2 by RT PCR (hospital order, performed in [facility] hospital lab) Nasopharyngeal Nasopharyngeal Swab     Status: None   Collection Time: 03/16/19 10:11 PM   Specimen: Nasopharyngeal Swab  Result Value Ref Range   SARS Coronavirus 2 NEGATIVE NEGATIVE    Comment: (NOTE) If result is NEGATIVE SARS-CoV-2 target nucleic acids are NOT DETECTED. The SARS-CoV-2 RNA is generally detectable in upper and lower  respiratory specimens during the acute phase of infection. The lowest  concentration of SARS-CoV-2 viral copies this assay can detect is 250  copies / mL. A negative result does not preclude SARS-CoV-2 infection  and should not be used as the sole basis for treatment or other  patient management decisions.  A negative result may occur with  improper specimen collection / handling, submission of specimen other  than nasopharyngeal swab, presence of viral mutation(s) within the  areas targeted by this assay, and inadequate number of viral copies  (<250 copies / mL). A negative result must be combined with clinical  observations, patient history, and epidemiological information. If result is POSITIVE SARS-CoV-2 target nucleic acids are DETECTED. The SARS-CoV-2 RNA is generally detectable in upper and lower  respiratory specimens dur ing the acute phase of infection.  Positive  results are indicative of active infection with SARS-CoV-2.  Clinical  correlation with patient history and other diagnostic information is  necessary to determine patient infection status.  Positive results do  not rule out bacterial infection or co-infection with other viruses. If result is PRESUMPTIVE POSTIVE SARS-CoV-2 nucleic acids MAY BE PRESENT.   A presumptive positive result was obtained on the submitted specimen  and confirmed on repeat testing.  While 2019 novel coronavirus  (SARS-CoV-2) nucleic acids may be present in the submitted sample  additional confirmatory testing may  be necessary for epidemiological  and / or clinical management purposes  to differentiate between  SARS-CoV-2 and other Sarbecovirus currently known to infect humans.  If clinically indicated additional testing with an alternate test  methodology [id number]) is advised. The SARS-CoV-2 RNA is generally  detectable in upper and lower respiratory sp ecimens during the acute  phase of infection. The expected result is Negative. Fact Sheet for Patients:  StrictlyIdeas.no Fact Sheet for Healthcare Providers: BankingDealers.co.za This test is not yet approved or cleared by the [place] FDA and has been authorized for detection and/or diagnosis of SARS-CoV-2 by FDA under an Emergency Use Authorization (EUA).  This EUA will remain in effect (meaning this test can be used) for the duration of the COVID-19 declaration under Section 564(b)(1) of the Act, 21 U.S.C. section 360bbb-3(b)(1), unless the authorization is terminated or revoked sooner. Performed at [facility], [place] [address] [place], [place] [address]   CBC     Status: Abnormal   Collection Time: 03/17/19 12:57 AM  Result Value Ref Range   WBC 18.1 (H) 4.0 - 10.5 K/uL   RBC 3.11 (L) 4.22 - 5.81 MIL/uL   Hemoglobin 8.5 (L) 13.0 - 17.0 g/dL   HCT 27.5 (L) 39.0 - 52.0 %   MCV 88.4 80.0 - 100.0 fL   MCH 27.3 26.0 - 34.0 pg   MCHC 30.9 30.0 -  36.0 g/dL   RDW 17.3 (H) 11.5 - 15.5 %   Platelets 174 150 - 400 K/uL   nRBC 0.0 0.0 - 0.2 %    Comment: Performed at [facility], [place] [address], [place], [place] [address]  Creatinine, serum     Status: None   Collection Time: 03/17/19 12:57 AM  Result Value Ref Range   Creatinine, Ser 1.19 0.61 - 1.24 mg/dL   GFR calc non Af Amer >60 >60 mL/min   GFR calc Af Amer >60 >60 mL/min    Comment: Performed at [facility], [place] [address] [place], [place] [address]  Comprehensive metabolic  panel     Status: Abnormal   Collection Time: 03/17/19  5:51 AM  Result Value Ref Range   Sodium 137 135 - 145 mmol/L   Potassium 3.6 3.5 - 5.1 mmol/L   Chloride 109 98 - 111 mmol/L   CO2 21 (L) 22 - 32 mmol/L   Glucose, Bld 94 70 - 99 mg/dL   BUN 25 (H) 8 - 23 mg/dL   Creatinine, Ser 1.36 (H) 0.61 - 1.24 mg/dL   Calcium 7.7 (L) 8.9 - 10.3 mg/dL   Total Protein 6.0 (L) 6.5 - 8.1 g/dL   Albumin 2.6 (L) 3.5 - 5.0 g/dL   AST 17 15 - 41 U/L   ALT 8 0 - 44 U/L   Alkaline Phosphatase 52 38 - 126 U/L   Total Bilirubin 0.5 0.3 - 1.2 mg/dL   GFR calc non Af Amer 52 (L) >60 mL/min   GFR calc Af Amer >60 >60 mL/min   Anion gap 7 5 - 15    Comment: Performed at [facility], [place] [address] [place], [place] [address]  CBC WITH DIFFERENTIAL     Status: Abnormal   Collection Time: 03/17/19  5:51 AM  Result Value Ref Range   WBC 18.9 (H) 4.0 - 10.5 K/uL   RBC 2.97 (L) 4.22 - 5.81 MIL/uL   Hemoglobin 8.4 (L) 13.0 - 17.0 g/dL   HCT 26.6 (L) 39.0 - 52.0 %   MCV 89.6 80.0 - 100.0 fL   MCH 28.3 26.0 - 34.0 pg   MCHC 31.6 30.0 - 36.0 g/dL   RDW 17.4 (H) 11.5 - 15.5 %   Platelets 175 150 - 400 K/uL   nRBC 0.0 0.0 - 0.2 %   Neutrophils Relative % 87 %   Neutro Abs 16.2 (H) 1.7 - 7.7 K/uL   Lymphocytes Relative 3 %   Lymphs Abs 0.6 (L) 0.7 - 4.0 K/uL   Monocytes Relative 8 %   Monocytes Absolute 1.5 (H) 0.1 - 1.0 K/uL   Eosinophils Relative 0 %   Eosinophils Absolute 0.1 0.0 - 0.5 K/uL   Basophils Relative 0 %   Basophils Absolute 0.0 0.0 - 0.1 K/uL   Immature Granulocytes 2 %   Abs Immature Granulocytes 0.45 (H) 0.00 - 0.07 K/uL    Comment: Performed at [facility], [place] [address] [place], [place] [address]  APTT     Status: Abnormal   Collection Time: 03/17/19  7:00 AM  Result Value Ref Range   aPTT 39 (H) 24 - 36 seconds    Comment:        IF BASELINE aPTT IS ELEVATED, SUGGEST PATIENT RISK ASSESSMENT BE USED TO DETERMINE  APPROPRIATE ANTICOAGULANT THERAPY. Performed at [facility], [place] [address] [place], [place] [address]     Dg Chest  2 View  Result Date: 03/16/2019 CLINICAL DATA:  Weakness and fevers, sepsis EXAM: CHEST - 2 VIEW COMPARISON:  01/06/2019 FINDINGS: The heart size and mediastinal contours are within normal limits. Both lungs are clear. The visualized skeletal structures are unremarkable. Minimal left basilar scarring versus atelectasis. Trachea midline. Bilateral shoulder replacements noted. IMPRESSION: No acute chest process. Electronically Signed   By: Jerilynn Mages.  Shick M.D.   On: 03/16/2019 19:12   Ct Abdomen Pelvis W Contrast  Result Date: 03/16/2019 CLINICAL DATA:  73 year old male with fever of unknown origin. Renal stent placed 3 weeks ago. History of bladder cancer. EXAM: CT ABDOMEN AND PELVIS WITH CONTRAST TECHNIQUE: Multidetector CT imaging of the abdomen and pelvis was performed using the standard protocol following bolus administration of intravenous contrast. CONTRAST:  186mL OMNIPAQUE IOHEXOL 300 MG/ML  SOLN COMPARISON:  CT Abdomen and Pelvis 01/02/2019. FINDINGS: Lower chest: Stable mild cardiomegaly. Negative lung bases, no effusion. Hepatobiliary: Chronic cholelithiasis with numerous gallstones. No pericholecystic inflammation. Negative liver. No bile duct enlargement. Pancreas: Negative. Spleen: Negative. Adrenals/Urinary Tract: Normal adrenal glands. Negative right kidney and proximal right ureter. Continued left renal inflammation, although partially regressed stranding of the kidney and left pararenal fat since 01/02/2019. Continued abnormal enhancement of the left renal cortex, and on delayed images no left renal contrast excretion is identified. Superimposed left nephroureteral stent has been placed. The distal aspect of the stent is discussed below. No adverse features are identified at the left kidney or the skin site. Surgically absent bladder. Stomach/Bowel:  Diverticulosis from the sigmoid colon into the descending colon. Large bowel retained stool and redundancy. No large bowel inflammation. Prior appendectomy (coronal image 67. Negative terminal ileum. Just upstream of the TI there is a small bowel anastomosis with no adverse features identified. There are no dilated loops of small bowel. An ileal conduit exits the skin in the right abdomen on series 2, image 49. A short distance upstream of the ileostomy loop (series 2, image 54) a ureteral stent terminates. This is an internal-external stent, which traverses the kidney as additional detailed above. Negative stomach.  No free air.  No free fluid. Vascular/Lymphatic: Major arterial structures are patent. Mild Aortoiliac calcified atherosclerosis. Portal venous system appears to be patent. No lymphadenopathy. Reproductive: Stable appearance of prior cystoprostatectomy. Small fat containing left inguinal hernia. Other: No pelvic free fluid. Musculoskeletal: Degenerative changes in the spine. No acute or suspicious osseous lesion. IMPRESSION: 1. Continued inflammation of the left kidney compatible with pyelonephritis, although the degree of left renal and pararenal stranding is perhaps mildly improved since August. A left nephroureteral stent has been placed. The distal aspect of the stent travels within the ileal conduit and terminates a several centimeters upstream of ileostomy, with no adverse features. 2. Right kidney remains normal. Stable appearance of cystoprostatectomy. 3. No other acute or inflammatory process. Cholelithiasis. Large bowel diverticulosis and retained stool. Electronically Signed   By: Genevie Ann M.D.   On: 03/16/2019 22:22    Review of Systems  Constitutional: Positive for chills, fever and weight loss.  All other systems reviewed and are negative.  Blood pressure 112/62, pulse (!) 103, temperature 98.5 F (36.9 C), temperature source Oral, resp. rate 18, height 5\' 6"  (1.676 m), weight 90.7  kg, SpO2 97 %. Physical Exam  Constitutional: He appears well-developed.  Wife at bedside.   HENT:  Head: Normocephalic.  Neck: Normal range of motion.  Cardiovascular:  Mild regular tachycardia.   Respiratory: Effort normal.  GI: Soft.  Genitourinary:  Genitourinary Comments: RLQ pink and patent of copious non-foul urine. Left nephrorueteral stent capped. No surrounding abscesses.    Musculoskeletal: Normal range of motion.  Neurological: He is alert.  Skin: Skin is warm.  Psychiatric: He has a normal mood and affect.    Assessment/Plan:   1 - Recurrent High Grade Bladder Cancer -  good onoclogic prognosis, no further cancer directed therapy at this time.    2 -Recurrent Pyelonephritis - likely 2/2 partial left obstruction as per below, now stented. Agree with current cefipime pending addiotnal CX data. Would rec fever free x 24 hours as minimal criteria for discharge.   3 -  Left Hydronephrosis Distal Partial Ureteral Stricture- now s/p endoscopic dilation. Frankly reinforced to family abtou 50% chance durable success. I will removed his nephrotureteral stent on 10/19, this admission as long as fever free. It s in good position presently.  If fails this, then he will require open revision possibly with further bowel interposition in elective setting.   Greatly appreciate hospitalist comanagment. We will follow PRN over weekend and I will see him again Monday AM. WE are of course always available if urgent issues arise.   Alexis Frock 03/17/2019, 3:42 PM

## 2019-03-17 NOTE — ED Notes (Signed)
ED TO INPATIENT HANDOFF REPORT  Name/Age/Gender Billy Rasmussen 73 y.o. male  Code Status    Code Status Orders  (From admission, onward)         Start     Ordered   03/16/19 2326  Full code  Continuous     03/16/19 2327        Code Status History    Date Active Date Inactive Code Status Order ID Comments User Context   02/17/2019 1545 02/20/2019 1635 Full Code [id number]  Alexis Frock, MD Inpatient   01/02/2019 1759 01/06/2019 2200 Full Code [id number]  Mercy Riding, MD ED   12/22/2018 1750 12/25/2018 2007 Full Code [id number]  Barb Merino, MD ED   11/09/2018 1624 11/16/2018 1718 Full Code [id number]  Lattie Corns Inpatient   03/30/2016 1933 03/31/2016 1636 Full Code [id number]  Norman Herrlich Inpatient   07/20/2013 1410 07/21/2013 1329 Full Code [id number]  Marcellus Scott Inpatient   04/06/2013 1518 04/07/2013 1359 Full Code [id number]  Shuford, Olivia Mackie, PA-C Inpatient   [place] Care Planning Activity      Home/SNF/Other Home  Chief Complaint Weakness  Level of Care/Admitting Diagnosis ED Disposition    ED Disposition Condition Comment   [place]: [facility] [id number]  Level of Care: Telemetry [5]  Admit to tele based on following criteria: Monitor for Ischemic changes  Covid Evaluation: Asymptomatic Screening Protocol (No Symptoms)  [facility]: [id number]  Admitting Physician: Rise Patience [phone]  Attending Physician: Rise Patience [phone]  Estimated length of stay: past midnight tomorrow  Certification:: I certify this patient will need inpatient services for at least 2 midnights  PT Class (Do Not Modify): Inpatient [101]  PT Acc Code (Do Not Modify): Private [1]       Medical History Past Medical History:  Diagnosis Date  . Arthritis    knees, shoulders, elbows  . Bladder cancer [facility])    urologist-  dr Junious Silk  . Diverticulosis of colon   . Fibromyalgia   . GERD (gastroesophageal  reflux disease)   . History of diverticulitis of colon   . History of gastric ulcer    due to aleve  . Hypertension   . Lower urinary tract symptoms (LUTS)   . OSA (obstructive sleep apnea)    NON-COMPLIANT  CPAP  --- BUT PT USES OXYGEN AT NIGHT 2.5L VIA [place] (PT'S DECISION)  . PONV (postoperative nausea and vomiting)   . Psoriasis   . Tinnitus    right ear more, has tranmitter in right ear removable at hs  . Wears glasses   . Wears partial dentures     Allergies No Known Allergies  IV Location/Drains/Wounds Patient Lines/Drains/Airways Status   Active Line/Drains/Airways    Name:   Placement date:   Placement time:   Site:   Days:   Peripheral IV 03/16/19 Left Antecubital   03/16/19    2125    Antecubital   1   Peripheral IV 03/16/19 Right Antecubital   03/16/19    2125    Antecubital   1   PICC Single Lumen [id number] PICC Right Basilic 41 cm 1 cm   [id number]    [id number]    Basilic   70   Nephrostomy Left 10.2 Fr.   01/05/19    1729    Left   71   Urostomy Ileal conduit RUQ   11/09/18    1437    RUQ   128  Ureteral Drain/Stent Left ureter 7 Fr.   11/09/18    1338    Left ureter   128   Ureteral Drain/Stent Right ureter 7 Fr.   11/09/18    1400    Right ureter   128   Incision (Closed) 01/18/18 Perineum   01/18/18    0825     423   Incision (Closed) 11/09/18 Abdomen   11/09/18    1003     128   Incision (Closed) 02/17/19 Back   02/17/19    0955     28   Incision - 4 Ports Abdomen 1: Right;Lateral 2: Right;Medial 3: Left;Medial 4: Left;Lateral   11/09/18    0900     128          Labs/Imaging Results for orders placed or performed during the hospital encounter of 03/16/19 (from the past 48 hour(s))  Urinalysis, Routine w reflex microscopic     Status: Abnormal   Collection Time: 03/16/19  5:42 PM  Result Value Ref Range   Color, Urine YELLOW YELLOW   APPearance CLOUDY (A) CLEAR   Specific Gravity, Urine 1.015 1.005 - 1.030   pH 6.0 5.0 - 8.0   Glucose, UA NEGATIVE NEGATIVE  mg/dL   Hgb urine dipstick MODERATE (A) NEGATIVE   Bilirubin Urine NEGATIVE NEGATIVE   Ketones, ur NEGATIVE NEGATIVE mg/dL   Protein, ur 100 (A) NEGATIVE mg/dL   Nitrite POSITIVE (A) NEGATIVE   Leukocytes,Ua LARGE (A) NEGATIVE   RBC / HPF 21-50 0 - 5 RBC/hpf   WBC, UA >50 (H) 0 - 5 WBC/hpf   Bacteria, UA RARE (A) NONE SEEN   Mucus PRESENT     Comment: Performed at [facility], [place] [address], [place], [place] [address]  Comprehensive metabolic panel     Status: Abnormal   Collection Time: 03/16/19  6:42 PM  Result Value Ref Range   Sodium 134 (L) 135 - 145 mmol/L   Potassium 4.3 3.5 - 5.1 mmol/L   Chloride 100 98 - 111 mmol/L   CO2 22 22 - 32 mmol/L   Glucose, Bld 180 (H) 70 - 99 mg/dL   BUN 30 (H) 8 - 23 mg/dL   Creatinine, Ser 1.51 (H) 0.61 - 1.24 mg/dL   Calcium 9.4 8.9 - 10.3 mg/dL   Total Protein 8.2 (H) 6.5 - 8.1 g/dL   Albumin 3.7 3.5 - 5.0 g/dL   AST 26 15 - 41 U/L   ALT 13 0 - 44 U/L   Alkaline Phosphatase 73 38 - 126 U/L   Total Bilirubin 1.1 0.3 - 1.2 mg/dL   GFR calc non Af Amer 46 (L) >60 mL/min   GFR calc Af Amer 53 (L) >60 mL/min   Anion gap 12 5 - 15    Comment: Performed at [facility], [place] [address], [place], [place] [address]  CBC with Differential     Status: Abnormal   Collection Time: 03/16/19  6:42 PM  Result Value Ref Range   WBC 19.6 (H) 4.0 - 10.5 K/uL   RBC 3.69 (L) 4.22 - 5.81 MIL/uL   Hemoglobin 10.3 (L) 13.0 - 17.0 g/dL   HCT 32.4 (L) 39.0 - 52.0 %   MCV 87.8 80.0 - 100.0 fL   MCH 27.9 26.0 - 34.0 pg   MCHC 31.8 30.0 - 36.0 g/dL   RDW 17.2 (H) 11.5 - 15.5 %   Platelets 256 150 - 400 K/uL   nRBC 0.0 0.0 -  0.2 %   Neutrophils Relative % 91 %   Neutro Abs 17.9 (H) 1.7 - 7.7 K/uL   Lymphocytes Relative 3 %   Lymphs Abs 0.5 (L) 0.7 - 4.0 K/uL   Monocytes Relative 5 %   Monocytes Absolute 1.0 0.1 - 1.0 K/uL   Eosinophils Relative 0 %   Eosinophils Absolute 0.0 0.0 - 0.5 K/uL   Basophils Relative 0 %    Basophils Absolute 0.0 0.0 - 0.1 K/uL   Immature Granulocytes 1 %   Abs Immature Granulocytes 0.12 (H) 0.00 - 0.07 K/uL    Comment: Performed at [facility], [place] [address] [place], [place] [address]  Protime-INR     Status: Abnormal   Collection Time: 03/16/19  6:42 PM  Result Value Ref Range   Prothrombin Time 15.4 (H) 11.4 - 15.2 seconds   INR 1.2 0.8 - 1.2    Comment: (NOTE) INR goal varies based on device and disease states. Performed at [facility], [place] [address], [place], [place] [address]   Lactic acid, plasma     Status: None   Collection Time: 03/16/19  6:51 PM  Result Value Ref Range   Lactic Acid, Venous 1.9 0.5 - 1.9 mmol/L    Comment: Performed at [facility], [place] [address], [place], [place] [address]  SARS Coronavirus 2 by RT PCR (hospital order, performed in [facility] hospital lab) Nasopharyngeal Nasopharyngeal Swab     Status: None   Collection Time: 03/16/19 10:11 PM   Specimen: Nasopharyngeal Swab  Result Value Ref Range   SARS Coronavirus 2 NEGATIVE NEGATIVE    Comment: (NOTE) If result is NEGATIVE SARS-CoV-2 target nucleic acids are NOT DETECTED. The SARS-CoV-2 RNA is generally detectable in upper and lower  respiratory specimens during the acute phase of infection. The lowest  concentration of SARS-CoV-2 viral copies this assay can detect is 250  copies / mL. A negative result does not preclude SARS-CoV-2 infection  and should not be used as the sole basis for treatment or other  patient management decisions.  A negative result may occur with  improper specimen collection / handling, submission of specimen other  than nasopharyngeal swab, presence of viral mutation(s) within the  areas targeted by this assay, and inadequate number of viral copies  (<250 copies / mL). A negative result must be combined with clinical  observations, patient history, and epidemiological information. If result  is POSITIVE SARS-CoV-2 target nucleic acids are DETECTED. The SARS-CoV-2 RNA is generally detectable in upper and lower  respiratory specimens dur ing the acute phase of infection.  Positive  results are indicative of active infection with SARS-CoV-2.  Clinical  correlation with patient history and other diagnostic information is  necessary to determine patient infection status.  Positive results do  not rule out bacterial infection or co-infection with other viruses. If result is PRESUMPTIVE POSTIVE SARS-CoV-2 nucleic acids MAY BE PRESENT.   A presumptive positive result was obtained on the submitted specimen  and confirmed on repeat testing.  While 2019 novel coronavirus  (SARS-CoV-2) nucleic acids may be present in the submitted sample  additional confirmatory testing may be necessary for epidemiological  and / or clinical management purposes  to differentiate between  SARS-CoV-2 and other Sarbecovirus currently known to infect humans.  If clinically indicated additional testing with an alternate test  methodology [phone]) is advised. The SARS-CoV-2 RNA is generally  detectable in upper and lower respiratory sp ecimens during the acute  phase of infection. The expected result is Negative. Fact Sheet for Patients:  StrictlyIdeas.no Fact Sheet for Healthcare Providers: BankingDealers.co.za This test is not yet approved or cleared by the [place] FDA and has been authorized for detection and/or diagnosis of SARS-CoV-2 by FDA under an Emergency Use Authorization (EUA).  This EUA will remain in effect (meaning this test can be used) for the duration of the COVID-19 declaration under Section 564(b)(1) of the Act, 21 U.S.C. section 360bbb-3(b)(1), unless the authorization is terminated or revoked sooner. Performed at [facility], [place] [address] [place], [place] [address]   CBC     Status: Abnormal   Collection  Time: 03/17/19 12:57 AM  Result Value Ref Range   WBC 18.1 (H) 4.0 - 10.5 K/uL   RBC 3.11 (L) 4.22 - 5.81 MIL/uL   Hemoglobin 8.5 (L) 13.0 - 17.0 g/dL   HCT 27.5 (L) 39.0 - 52.0 %   MCV 88.4 80.0 - 100.0 fL   MCH 27.3 26.0 - 34.0 pg   MCHC 30.9 30.0 - 36.0 g/dL   RDW 17.3 (H) 11.5 - 15.5 %   Platelets 174 150 - 400 K/uL   nRBC 0.0 0.0 - 0.2 %    Comment: Performed at [facility], [place] [address], [place], [place] [address]  Creatinine, serum     Status: None   Collection Time: 03/17/19 12:57 AM  Result Value Ref Range   Creatinine, Ser 1.19 0.61 - 1.24 mg/dL   GFR calc non Af Amer >60 >60 mL/min   GFR calc Af Amer >60 >60 mL/min    Comment: Performed at [facility], [place] [address] [place], [place] [address]  Comprehensive metabolic panel     Status: Abnormal   Collection Time: 03/17/19  5:51 AM  Result Value Ref Range   Sodium 137 135 - 145 mmol/L   Potassium 3.6 3.5 - 5.1 mmol/L   Chloride 109 98 - 111 mmol/L   CO2 21 (L) 22 - 32 mmol/L   Glucose, Bld 94 70 - 99 mg/dL   BUN 25 (H) 8 - 23 mg/dL   Creatinine, Ser 1.36 (H) 0.61 - 1.24 mg/dL   Calcium 7.7 (L) 8.9 - 10.3 mg/dL   Total Protein 6.0 (L) 6.5 - 8.1 g/dL   Albumin 2.6 (L) 3.5 - 5.0 g/dL   AST 17 15 - 41 U/L   ALT 8 0 - 44 U/L   Alkaline Phosphatase 52 38 - 126 U/L   Total Bilirubin 0.5 0.3 - 1.2 mg/dL   GFR calc non Af Amer 52 (L) >60 mL/min   GFR calc Af Amer >60 >60 mL/min   Anion gap 7 5 - 15    Comment: Performed at [facility], [place] [address], [place],  [address]  CBC WITH DIFFERENTIAL     Status: Abnormal   Collection Time: 03/17/19  5:51 AM  Result Value Ref Range   WBC 18.9 (H) 4.0 - 10.5 K/uL   RBC 2.97 (L) 4.22 - 5.81 MIL/uL   Hemoglobin 8.4 (L) 13.0 - 17.0 g/dL   HCT 26.6 (L) 39.0 - 52.0 %   MCV 89.6 80.0 - 100.0 fL   MCH 28.3 26.0 - 34.0 pg   MCHC 31.6 30.0 - 36.0 g/dL   RDW 17.4 (H) 11.5 - 15.5 %   Platelets 175 150 - 400 K/uL    nRBC 0.0 0.0 - 0.2 %   Neutrophils Relative % 87 %  Neutro Abs 16.2 (H) 1.7 - 7.7 K/uL   Lymphocytes Relative 3 %   Lymphs Abs 0.6 (L) 0.7 - 4.0 K/uL   Monocytes Relative 8 %   Monocytes Absolute 1.5 (H) 0.1 - 1.0 K/uL   Eosinophils Relative 0 %   Eosinophils Absolute 0.1 0.0 - 0.5 K/uL   Basophils Relative 0 %   Basophils Absolute 0.0 0.0 - 0.1 K/uL   Immature Granulocytes 2 %   Abs Immature Granulocytes 0.45 (H) 0.00 - 0.07 K/uL    Comment: Performed at [facility], [place] [address], [place], [place] [address]  APTT     Status: Abnormal   Collection Time: 03/17/19  7:00 AM  Result Value Ref Range   aPTT 39 (H) 24 - 36 seconds    Comment:        IF BASELINE aPTT IS ELEVATED, SUGGEST PATIENT RISK ASSESSMENT BE USED TO DETERMINE APPROPRIATE ANTICOAGULANT THERAPY. Performed at [facility], [place] [address], [place],  [address]    Dg Chest 2 View  Result Date: 03/16/2019 CLINICAL DATA:  Weakness and fevers, sepsis EXAM: CHEST - 2 VIEW COMPARISON:  01/06/2019 FINDINGS: The heart size and mediastinal contours are within normal limits. Both lungs are clear. The visualized skeletal structures are unremarkable. Minimal left basilar scarring versus atelectasis. Trachea midline. Bilateral shoulder replacements noted. IMPRESSION: No acute chest process. Electronically Signed   By: Jerilynn Mages.  Shick M.D.   On: 03/16/2019 19:12   Ct Abdomen Pelvis W Contrast  Result Date: 03/16/2019 CLINICAL DATA:  73 year old male with fever of unknown origin. Renal stent placed 3 weeks ago. History of bladder cancer. EXAM: CT ABDOMEN AND PELVIS WITH CONTRAST TECHNIQUE: Multidetector CT imaging of the abdomen and pelvis was performed using the standard protocol following bolus administration of intravenous contrast. CONTRAST:  120mL OMNIPAQUE IOHEXOL 300 MG/ML  SOLN COMPARISON:  CT Abdomen and Pelvis 01/02/2019. FINDINGS: Lower chest: Stable mild cardiomegaly. Negative lung  bases, no effusion. Hepatobiliary: Chronic cholelithiasis with numerous gallstones. No pericholecystic inflammation. Negative liver. No bile duct enlargement. Pancreas: Negative. Spleen: Negative. Adrenals/Urinary Tract: Normal adrenal glands. Negative right kidney and proximal right ureter. Continued left renal inflammation, although partially regressed stranding of the kidney and left pararenal fat since 01/02/2019. Continued abnormal enhancement of the left renal cortex, and on delayed images no left renal contrast excretion is identified. Superimposed left nephroureteral stent has been placed. The distal aspect of the stent is discussed below. No adverse features are identified at the left kidney or the skin site. Surgically absent bladder. Stomach/Bowel: Diverticulosis from the sigmoid colon into the descending colon. Large bowel retained stool and redundancy. No large bowel inflammation. Prior appendectomy (coronal image 67. Negative terminal ileum. Just upstream of the TI there is a small bowel anastomosis with no adverse features identified. There are no dilated loops of small bowel. An ileal conduit exits the skin in the right abdomen on series 2, image 49. A short distance upstream of the ileostomy loop (series 2, image 54) a ureteral stent terminates. This is an internal-external stent, which traverses the kidney as additional detailed above. Negative stomach.  No free air.  No free fluid. Vascular/Lymphatic: Major arterial structures are patent. Mild Aortoiliac calcified atherosclerosis. Portal venous system appears to be patent. No lymphadenopathy. Reproductive: Stable appearance of prior cystoprostatectomy. Small fat containing left inguinal hernia. Other: No pelvic free fluid. Musculoskeletal: Degenerative changes in the spine. No acute or suspicious osseous lesion. IMPRESSION: 1. Continued inflammation of the left kidney  compatible with pyelonephritis, although the degree of left renal and pararenal  stranding is perhaps mildly improved since August. A left nephroureteral stent has been placed. The distal aspect of the stent travels within the ileal conduit and terminates a several centimeters upstream of ileostomy, with no adverse features. 2. Right kidney remains normal. Stable appearance of cystoprostatectomy. 3. No other acute or inflammatory process. Cholelithiasis. Large bowel diverticulosis and retained stool. Electronically Signed   By: Genevie Ann M.D.   On: 03/16/2019 22:22    Pending Labs Unresulted Labs (From admission, onward)    Start     Ordered   03/16/19 2037  Urine culture  ONCE - STAT,   STAT     03/16/19 2038   03/16/19 1742  Culture, blood (Routine x 2)  BLOOD CULTURE X 2,   STAT     03/16/19 1741          Vitals/Pain Today's Vitals   03/17/19 0629 03/17/19 0629 03/17/19 0630 03/17/19 0809  BP:   104/73   Pulse: 89  89   Resp: (!) 22  (!) 23   Temp:      TempSrc:      SpO2: 100%  100%   Weight:      Height:      PainSc:  3   Asleep    Isolation Precautions No active isolations  Medications Medications  docusate sodium (COLACE) capsule 200 mg (has no administration in time range)  meclizine (ANTIVERT) tablet 25 mg (has no administration in time range)  acetaminophen (TYLENOL) tablet 650 mg (has no administration in time range)    Or  acetaminophen (TYLENOL) suppository 650 mg (has no administration in time range)  ondansetron (ZOFRAN) tablet 4 mg (has no administration in time range)    Or  ondansetron (ZOFRAN) injection 4 mg (has no administration in time range)  heparin injection 5,000 Units (has no administration in time range)  0.9 %  sodium chloride infusion ( Intravenous Bolus from Bag 03/17/19 0600)  ceFEPIme (MAXIPIME) 2 g in sodium chloride 0.9 % 100 mL IVPB (2 g Intravenous New Bag/Given 03/17/19 0823)  sodium chloride 0.9 % bolus 2,000 mL (0 mLs Intravenous Stopped 03/16/19 2335)  cefTRIAXone (ROCEPHIN) 1 g in sodium chloride 0.9 % 100 mL  IVPB (0 g Intravenous Stopped 03/16/19 2209)  acetaminophen (TYLENOL) tablet 650 mg (650 mg Oral Given 03/16/19 2209)  iohexol (OMNIPAQUE) 300 MG/ML solution 100 mL (100 mLs Intravenous Contrast Given 03/16/19 2138)  sodium chloride 0.9 % bolus 1,000 mL (0 mLs Intravenous Stopped 03/17/19 0119)  cefTRIAXone (ROCEPHIN) 1 g in sodium chloride 0.9 % 100 mL IVPB (0 g Intravenous Stopped 03/17/19 0021)    Mobility walks with person assist

## 2019-03-17 NOTE — Progress Notes (Signed)
MEWS [place] Recheck temp- 98.5 oral.   .BP 112/62   Pulse (!) 103   Temp 98.5 F (36.9 C) (Oral)   Resp 18   Ht 5\' 6"  (1.676 m)   Wt 90.7 kg   SpO2 97%   BMI 32.28 kg/m

## 2019-03-17 NOTE — Progress Notes (Signed)
PROGRESS NOTE                                                                                                                                                                                                             Patient Demographics:    Billy Rasmussen, is a 73 y.o. male, DOB - [birth year], TC:[id number]  Admit date - 03/16/2019   Admitting Physician Rise Patience, MD  Outpatient Primary MD for the patient is Theodoro Clock  LOS - 1  Outpatient Specialists: Dr. Tresa Moore urology  Chief Complaint  Patient presents with  . Fever  . Fatigue       Brief Narrative 73 year old male with recurrent bladder cancer for which he underwent radical cystoprostatectomy with ileal conduit in June this year followed by hospitalization in August and September for sepsis with pyelonephritis requiring left ureteral stent for hydronephrosis presented to the ED with fevers, chills and flank pain.  Symptoms started the night before admission.  Denied any nausea, vomiting, diarrhea, chest pain, shortness of breath, headache, dysuria. In the ED he was septic with fever of 100.7 F, tachycardic and low systolic blood pressure in the 90s.  WBC of 19 point 6K, creatinine 1.5 and UA suggestive of UTI.  Sepsis pathway initiated and given IV fluid bolus following which blood pressure improved.  Tested for COVID-19 and was negative. CT of the abdomen pelvis showed findings of left-sided pyelonephritis without any obstruction.  Urine and blood culture sent and patient placed on empiric IV cefepime.    Subjective:   Patient reports mild discomfort in his flanks, no chills this morning.   Assessment  & Plan :    Principal Problem:   Sepsis ([place]) Secondary to UTI with acute left-sided pyelonephritis.  Continue IV hydration, empiric IV cefepime.  History of Pseudomonas bacteremia back in August.  Follow urine and blood culture.  Discussed  with urology Dr. Tresa Moore.  Recommends to continue IV antibiotic for now, will not need intervention unless symptoms deteriorate.  He will see the patient today.  Has not required pain medications this morning.  Active Problems:  Acute kidney injury ([place]) Likely prerenal ATN secondary to sepsis and pyelonephritis.  Lisinopril HCTZ held.  Getting IV hydration.  Monitor closely.  Essential hypertension Hypotensive due to sepsis on presentation.  Home meds (lisinopril and HCTZ) held  Malignant neoplasm of urinary bladder (HCC) History of recurrent high-grade bladder cancer status post radical cystoprostatectomy.  Anemia of chronic disease Patient is stable.     Code Status : Full code  Family Communication  : None  Disposition Plan  : Home once clinically improved,   Barriers For Discharge : Active symptoms  Consults  : Urology  Procedures  : CT abdomen pelvis  DVT Prophylaxis  : Subcu Lovenox  Lab Results  Component Value Date   PLT 175 03/17/2019    Antibiotics  :    Anti-infectives (From admission, onward)   Start     Dose/Rate Route Frequency Ordered Stop   03/17/19 1800  cefTRIAXone (ROCEPHIN) 2 g in sodium chloride 0.9 % 100 mL IVPB  Status:  Discontinued     2 g 200 mL/hr over 30 Minutes Intravenous Every 24 hours 03/16/19 2327 03/17/19 0634   03/17/19 0800  ceFEPIme (MAXIPIME) 2 g in sodium chloride 0.9 % 100 mL IVPB     2 g 200 mL/hr over 30 Minutes Intravenous Every 12 hours 03/17/19 0649     03/16/19 2245  cefTRIAXone (ROCEPHIN) 1 g in sodium chloride 0.9 % 100 mL IVPB     1 g 200 mL/hr over 30 Minutes Intravenous  Once 03/16/19 2239 03/17/19 0021   03/16/19 2045  cefTRIAXone (ROCEPHIN) 1 g in sodium chloride 0.9 % 100 mL IVPB     1 g 200 mL/hr over 30 Minutes Intravenous  Once 03/16/19 2035 03/16/19 2209        Objective:   Vitals:   03/17/19 0600 03/17/19 0629 03/17/19 0630 03/17/19 1008  BP: 109/62  104/73 107/69  Pulse: 87 89 89 86  Resp: (!)  21 (!) 22 (!) 23 20  Temp:    98.3 F (36.8 C)  TempSrc:    Oral  SpO2: 100% 100% 100% 100%  Weight:      Height:        Wt Readings from Last 3 Encounters:  03/16/19 90.7 kg  02/17/19 91.9 kg  02/09/19 95.2 kg     Intake/Output Summary (Last 24 hours) at 03/17/2019 1138 Last data filed at 03/17/2019 0603 Gross per 24 hour  Intake 3200 ml  Output 1050 ml  Net 2150 ml     Physical Exam  Gen: not in distress HEENT:  moist mucosa, supple neck Chest: clear b/l, no added sounds CVS: N S1&S2, no murmurs, rubs or gallop GI: soft, NT, ND, BS+, no CVA tenderness Musculoskeletal: warm, no edema     Data Review:    CBC Recent Labs  Lab 03/16/19 1842 03/17/19 0057 03/17/19 0551  WBC 19.6* 18.1* 18.9*  HGB 10.3* 8.5* 8.4*  HCT 32.4* 27.5* 26.6*  PLT 256 174 175  MCV 87.8 88.4 89.6  MCH 27.9 27.3 28.3  MCHC 31.8 30.9 31.6  RDW 17.2* 17.3* 17.4*  LYMPHSABS 0.5*  --  0.6*  MONOABS 1.0  --  1.5*  EOSABS 0.0  --  0.1  BASOSABS 0.0  --  0.0    Chemistries  Recent Labs  Lab 03/16/19 1842 03/17/19 0057 03/17/19 0551  NA 134*  --  137  K 4.3  --  3.6  CL 100  --  109  CO2 22  --  21*  GLUCOSE 180*  --  94  BUN 30*  --  25*  CREATININE 1.51* 1.19 1.36*  CALCIUM 9.4  --  7.7*  AST 26  --  17  ALT 13  --  8  ALKPHOS 73  --  52  BILITOT 1.1  --  0.5   ------------------------------------------------------------------------------------------------------------------ No results for input(s): CHOL, HDL, LDLCALC, TRIG, CHOLHDL, LDLDIRECT in the last 72 hours.  No results found for: HGBA1C ------------------------------------------------------------------------------------------------------------------ No results for input(s): TSH, T4TOTAL, T3FREE, THYROIDAB in the last 72 hours.  Invalid input(s): FREET3 ------------------------------------------------------------------------------------------------------------------ No results for input(s): VITAMINB12, FOLATE,  FERRITIN, TIBC, IRON, RETICCTPCT in the last 72 hours.  Coagulation profile Recent Labs  Lab 03/16/19 1842  INR 1.2    No results for input(s): DDIMER in the last 72 hours.  Cardiac Enzymes No results for input(s): CKMB, TROPONINI, MYOGLOBIN in the last 168 hours.  Invalid input(s): CK ------------------------------------------------------------------------------------------------------------------ No results found for: BNP  Inpatient Medications  Scheduled Meds: . docusate sodium  200 mg Oral Daily  . heparin  5,000 Units Subcutaneous Q8H   Continuous Infusions: . sodium chloride 125 mL/hr at 03/17/19 1059  . ceFEPime (MAXIPIME) IV 2 g (03/17/19 0823)   PRN Meds:.acetaminophen **OR** acetaminophen, meclizine, ondansetron **OR** ondansetron (ZOFRAN) IV  Micro Results Recent Results (from the past 240 hour(s))  Culture, blood (Routine x 2)     Status: None (Preliminary result)   Collection Time: 03/16/19  6:13 PM   Specimen: BLOOD  Result Value Ref Range Status   Specimen Description   Final    BLOOD LEFT ANTECUBITAL Performed at [facility] [address] [place], [place] [address]    Special Requests   Final    BOTTLES DRAWN AEROBIC AND ANAEROBIC Blood Culture adequate volume Performed at [facility] [address] [place], [place] [address]    Culture   Final    NO GROWTH < 24 HOURS Performed at [facility] [address], [place], [place] [address]    Report Status PENDING  Incomplete  SARS Coronavirus 2 by RT PCR (hospital order, performed in [facility] hospital lab) Nasopharyngeal Nasopharyngeal Swab     Status: None   Collection Time: 03/16/19 10:11 PM   Specimen: Nasopharyngeal Swab  Result Value Ref Range Status   SARS Coronavirus 2 NEGATIVE NEGATIVE Final    Comment: (NOTE) If result is NEGATIVE SARS-CoV-2 target nucleic acids are NOT DETECTED. The SARS-CoV-2 RNA is generally detectable in upper and  lower  respiratory specimens during the acute phase of infection. The lowest  concentration of SARS-CoV-2 viral copies this assay can detect is 250  copies / mL. A negative result does not preclude SARS-CoV-2 infection  and should not be used as the sole basis for treatment or other  patient management decisions.  A negative result may occur with  improper specimen collection / handling, submission of specimen other  than nasopharyngeal swab, presence of viral mutation(s) within the  areas targeted by this assay, and inadequate number of viral copies  (<250 copies / mL). A negative result must be combined with clinical  observations, patient history, and epidemiological information. If result is POSITIVE SARS-CoV-2 target nucleic acids are DETECTED. The SARS-CoV-2 RNA is generally detectable in upper and lower  respiratory specimens dur ing the acute phase of infection.  Positive  results are indicative of active infection with SARS-CoV-2.  Clinical  correlation with patient history and other diagnostic information is  necessary to determine patient infection status.  Positive results do  not rule out bacterial infection or co-infection with other viruses. If result is PRESUMPTIVE POSTIVE SARS-CoV-2 nucleic acids MAY BE PRESENT.   A presumptive positive result was obtained on  the submitted specimen  and confirmed on repeat testing.  While 2019 novel coronavirus  (SARS-CoV-2) nucleic acids may be present in the submitted sample  additional confirmatory testing may be necessary for epidemiological  and / or clinical management purposes  to differentiate between  SARS-CoV-2 and other Sarbecovirus currently known to infect humans.  If clinically indicated additional testing with an alternate test  methodology [phone]) is advised. The SARS-CoV-2 RNA is generally  detectable in upper and lower respiratory sp ecimens during the acute  phase of infection. The expected result is Negative.  Fact Sheet for Patients:  StrictlyIdeas.no Fact Sheet for Healthcare Providers: BankingDealers.co.za This test is not yet approved or cleared by the [place] FDA and has been authorized for detection and/or diagnosis of SARS-CoV-2 by FDA under an Emergency Use Authorization (EUA).  This EUA will remain in effect (meaning this test can be used) for the duration of the COVID-19 declaration under Section 564(b)(1) of the Act, 21 U.S.C. section 360bbb-3(b)(1), unless the authorization is terminated or revoked sooner. Performed at [facility], [place] [address], [place], [place] [address]     Radiology Reports Dg Chest 2 View  Result Date: 03/16/2019 CLINICAL DATA:  Weakness and fevers, sepsis EXAM: CHEST - 2 VIEW COMPARISON:  01/06/2019 FINDINGS: The heart size and mediastinal contours are within normal limits. Both lungs are clear. The visualized skeletal structures are unremarkable. Minimal left basilar scarring versus atelectasis. Trachea midline. Bilateral shoulder replacements noted. IMPRESSION: No acute chest process. Electronically Signed   By: Jerilynn Mages.  Shick M.D.   On: 03/16/2019 19:12   Ct Abdomen Pelvis W Contrast  Result Date: 03/16/2019 CLINICAL DATA:  73 year old male with fever of unknown origin. Renal stent placed 3 weeks ago. History of bladder cancer. EXAM: CT ABDOMEN AND PELVIS WITH CONTRAST TECHNIQUE: Multidetector CT imaging of the abdomen and pelvis was performed using the standard protocol following bolus administration of intravenous contrast. CONTRAST:  164mL OMNIPAQUE IOHEXOL 300 MG/ML  SOLN COMPARISON:  CT Abdomen and Pelvis 01/02/2019. FINDINGS: Lower chest: Stable mild cardiomegaly. Negative lung bases, no effusion. Hepatobiliary: Chronic cholelithiasis with numerous gallstones. No pericholecystic inflammation. Negative liver. No bile duct enlargement. Pancreas: Negative. Spleen: Negative. Adrenals/Urinary  Tract: Normal adrenal glands. Negative right kidney and proximal right ureter. Continued left renal inflammation, although partially regressed stranding of the kidney and left pararenal fat since 01/02/2019. Continued abnormal enhancement of the left renal cortex, and on delayed images no left renal contrast excretion is identified. Superimposed left nephroureteral stent has been placed. The distal aspect of the stent is discussed below. No adverse features are identified at the left kidney or the skin site. Surgically absent bladder. Stomach/Bowel: Diverticulosis from the sigmoid colon into the descending colon. Large bowel retained stool and redundancy. No large bowel inflammation. Prior appendectomy (coronal image 67. Negative terminal ileum. Just upstream of the TI there is a small bowel anastomosis with no adverse features identified. There are no dilated loops of small bowel. An ileal conduit exits the skin in the right abdomen on series 2, image 49. A short distance upstream of the ileostomy loop (series 2, image 54) a ureteral stent terminates. This is an internal-external stent, which traverses the kidney as additional detailed above. Negative stomach.  No free air.  No free fluid. Vascular/Lymphatic: Major arterial structures are patent. Mild Aortoiliac calcified atherosclerosis. Portal venous system appears to be patent. No lymphadenopathy. Reproductive: Stable appearance of prior cystoprostatectomy. Small fat containing left inguinal hernia. Other: No pelvic free fluid.  Musculoskeletal: Degenerative changes in the spine. No acute or suspicious osseous lesion. IMPRESSION: 1. Continued inflammation of the left kidney compatible with pyelonephritis, although the degree of left renal and pararenal stranding is perhaps mildly improved since August. A left nephroureteral stent has been placed. The distal aspect of the stent travels within the ileal conduit and terminates a several centimeters upstream of  ileostomy, with no adverse features. 2. Right kidney remains normal. Stable appearance of cystoprostatectomy. 3. No other acute or inflammatory process. Cholelithiasis. Large bowel diverticulosis and retained stool. Electronically Signed   By: Genevie Ann M.D.   On: 03/16/2019 22:22   Dg C-arm 1-60 Min-no Report  Result Date: 02/17/2019 Fluoroscopy was utilized by the requesting physician.  No radiographic interpretation.    Time Spent in minutes 35   Suzanna Zahn M.D on 03/17/2019 at 11:38 AM  Between 7am to 7pm - Pager - [phone]  After 7pm go to [email or web address] - password [facility]  [facility] -  Office  [phone]

## 2019-03-18 DIAGNOSIS — N10 Acute tubulo-interstitial nephritis: Secondary | ICD-10-CM

## 2019-03-18 LAB — URINE CULTURE: Culture: >=100000 — AB

## 2019-03-18 LAB — BASIC METABOLIC PANEL
Anion gap: 9 (ref 5–15)
BUN: 25 mg/dL — ABNORMAL HIGH (ref 8–23)
CO2: 21 mmol/L — ABNORMAL LOW (ref 22–32)
Calcium: 9.2 mg/dL (ref 8.9–10.3)
Chloride: 109 mmol/L (ref 98–111)
Creatinine, Ser: 1.2 mg/dL (ref 0.61–1.24)
GFR calc Af Amer: >60 mL/min (ref >60)
GFR calc non Af Amer: >60 mL/min (ref >60)
Glucose, Bld: 89 mg/dL (ref 70–99)
Potassium: 4.7 mmol/L (ref 3.5–5.1)
Sodium: 139 mmol/L (ref 135–145)

## 2019-03-18 LAB — CBC
HCT: 27.8 % — ABNORMAL LOW (ref 39.0–52.0)
Hemoglobin: 8.9 g/dL — ABNORMAL LOW (ref 13.0–17.0)
MCH: 27.6 pg (ref 26.0–34.0)
MCHC: 32 g/dL (ref 30.0–36.0)
MCV: 86.3 fL (ref 80.0–100.0)
Platelets: 160 10*3/uL (ref 150–400)
RBC: 3.22 MIL/uL — ABNORMAL LOW (ref 4.22–5.81)
RDW: 17.4 % — ABNORMAL HIGH (ref 11.5–15.5)
WBC: 18.3 10*3/uL — ABNORMAL HIGH (ref 4.0–10.5)
nRBC: 0 % (ref 0.0–0.2)

## 2019-03-18 MED ORDER — SODIUM CHLORIDE 0.9 % IV SOLN
1.0000 g | Freq: Three times a day (TID) | INTRAVENOUS | Status: DC
  Administered 2019-03-18 – 2019-03-21 (×8): 1 g via INTRAVENOUS
  Filled 2019-03-18 (×10): qty 1

## 2019-03-18 NOTE — Plan of Care (Signed)

## 2019-03-18 NOTE — Progress Notes (Signed)
PROGRESS NOTE                                                                                                                                                                                                             Patient Demographics:    Billy Rasmussen, is a 73 y.o. male, DOB - 01-Oct-1945, TC:[id number]  Admit date - 03/16/2019   Admitting Physician Rise Patience, MD  Outpatient Primary MD for the patient is Theodoro Clock  LOS - 2  Outpatient Specialists: Dr. Tresa Moore urology  Chief Complaint  Patient presents with  . Fever  . Fatigue       Brief Narrative 73 year old male with recurrent bladder cancer for which he underwent radical cystoprostatectomy with ileal conduit in June this year followed by hospitalization in August and September for sepsis with pyelonephritis requiring left ureteral stent for hydronephrosis presented to the ED with fevers, chills and flank pain.  Symptoms started the night before admission.  Denied any nausea, vomiting, diarrhea, chest pain, shortness of breath, headache, dysuria. In the ED he was septic with fever of 100.7 F, tachycardic and low systolic blood pressure in the 90s.  WBC of 19 point 6K, creatinine 1.5 and UA suggestive of UTI.  Sepsis pathway initiated and given IV fluid bolus following which blood pressure improved.  Tested for COVID-19 and was negative. CT of the abdomen pelvis showed findings of left-sided pyelonephritis without any obstruction.  Urine and blood culture sent and patient placed on empiric IV cefepime.    Subjective:   Still septic and febrile with temperature spikes.  Reports having some flank pain.  No nausea or vomiting.   Assessment  & Plan :    Principal Problem: Severe sepsis ([place]) Secondary to UTI with acute left-sided pyelonephritis.  Continue IV hydration and empiric IV cefepime.  History of Pseudomonas bacteremia in August.  Follow  cultures.    seen by his urologist who plans on removing his nephroureteral stent on 10/19 if patient is not septic.  If it fails, patient will need open revision electively.   Active Problems:  Acute kidney injury ([place]) Likely prerenal ATN secondary to sepsis and pyelonephritis.  Lisinopril HCTZ held.  Resolved with IV fluids.  Essential hypertension Hypotensive due to sepsis on presentation.  Home meds (lisinopril and HCTZ) held    Malignant  neoplasm of urinary bladder ([facility]) History of recurrent high-grade bladder cancer status post radical cystoprostatectomy.  Anemia of chronic disease Stable     Code Status : Full code  Family Communication  : None.  Wife involved in care  Disposition Plan  : Home once clinically improved, neurological procedure planned for 10/19.  Barriers For Discharge : Active symptoms  Consults  : Urology  Procedures  : CT abdomen pelvis  DVT Prophylaxis  : Subcu Lovenox  Lab Results  Component Value Date   PLT 160 03/18/2019    Antibiotics  :    Anti-infectives (From admission, onward)   Start     Dose/Rate Route Frequency Ordered Stop   03/17/19 1800  cefTRIAXone (ROCEPHIN) 2 g in sodium chloride 0.9 % 100 mL IVPB  Status:  Discontinued     2 g 200 mL/hr over 30 Minutes Intravenous Every 24 hours 03/16/19 2327 03/17/19 0634   03/17/19 0800  ceFEPIme (MAXIPIME) 2 g in sodium chloride 0.9 % 100 mL IVPB     2 g 200 mL/hr over 30 Minutes Intravenous Every 12 hours 03/17/19 0649     03/16/19 2245  cefTRIAXone (ROCEPHIN) 1 g in sodium chloride 0.9 % 100 mL IVPB     1 g 200 mL/hr over 30 Minutes Intravenous  Once 03/16/19 2239 03/17/19 0021   03/16/19 2045  cefTRIAXone (ROCEPHIN) 1 g in sodium chloride 0.9 % 100 mL IVPB     1 g 200 mL/hr over 30 Minutes Intravenous  Once 03/16/19 2035 03/16/19 2209        Objective:   Vitals:   03/17/19 1538 03/17/19 2004 03/18/19 0500 03/18/19 0555  BP: 112/62 (!) 126/111  129/79  Pulse: (!) 103 (!)  103  88  Resp: 18 16  16   Temp: 98.5 F (36.9 C) 98.6 F (37 C)  98.5 F (36.9 C)  TempSrc: Oral   Oral  SpO2: 97% 100%  100%  Weight:   95.2 kg   Height:        Wt Readings from Last 3 Encounters:  03/18/19 95.2 kg  02/17/19 91.9 kg  02/09/19 95.2 kg     Intake/Output Summary (Last 24 hours) at 03/18/2019 1033 Last data filed at 03/18/2019 0500 Gross per 24 hour  Intake 1460 ml  Output 1300 ml  Net 160 ml    Physical exam Not in distress HEENT: Moist mucosa, supple neck Chest: Clear bilaterally CVs: Normal S1-S2, no murmurs GI: Soft, nondistended, nontender, left nephroureteral stent, mild left CVA tenderness, BS+ Musculoskeletal: Warm, no edema    Data Review:    CBC Recent Labs  Lab 03/16/19 1842 03/17/19 0057 03/17/19 0551 03/18/19 0554  WBC 19.6* 18.1* 18.9* 18.3*  HGB 10.3* 8.5* 8.4* 8.9*  HCT 32.4* 27.5* 26.6* 27.8*  PLT 256 174 175 160  MCV 87.8 88.4 89.6 86.3  MCH 27.9 27.3 28.3 27.6  MCHC 31.8 30.9 31.6 32.0  RDW 17.2* 17.3* 17.4* 17.4*  LYMPHSABS 0.5*  --  0.6*  --   MONOABS 1.0  --  1.5*  --   EOSABS 0.0  --  0.1  --   BASOSABS 0.0  --  0.0  --     Chemistries  Recent Labs  Lab 03/16/19 1842 03/17/19 0057 03/17/19 0551 03/18/19 0554  NA 134*  --  137 139  K 4.3  --  3.6 4.7  CL 100  --  109 109  CO2 22  --  21* 21*  GLUCOSE 180*  --  94 89  BUN 30*  --  25* 25*  CREATININE 1.51* 1.19 1.36* 1.20  CALCIUM 9.4  --  7.7* 9.2  AST 26  --  17  --   ALT 13  --  8  --   ALKPHOS 73  --  52  --   BILITOT 1.1  --  0.5  --    ------------------------------------------------------------------------------------------------------------------ No results for input(s): CHOL, HDL, LDLCALC, TRIG, CHOLHDL, LDLDIRECT in the last 72 hours.  No results found for: HGBA1C ------------------------------------------------------------------------------------------------------------------ No results for input(s): TSH, T4TOTAL, T3FREE, THYROIDAB in  the last 72 hours.  Invalid input(s): FREET3 ------------------------------------------------------------------------------------------------------------------ No results for input(s): VITAMINB12, FOLATE, FERRITIN, TIBC, IRON, RETICCTPCT in the last 72 hours.  Coagulation profile Recent Labs  Lab 03/16/19 1842  INR 1.2    No results for input(s): DDIMER in the last 72 hours.  Cardiac Enzymes No results for input(s): CKMB, TROPONINI, MYOGLOBIN in the last 168 hours.  Invalid input(s): CK ------------------------------------------------------------------------------------------------------------------ No results found for: BNP  Inpatient Medications  Scheduled Meds: . docusate sodium  200 mg Oral Daily  . heparin  5,000 Units Subcutaneous Q8H   Continuous Infusions: . ceFEPime (MAXIPIME) IV 2 g (03/18/19 0836)   PRN Meds:.acetaminophen **OR** acetaminophen, meclizine, ondansetron **OR** ondansetron (ZOFRAN) IV  Micro Results Recent Results (from the past 240 hour(s))  Culture, blood (Routine x 2)     Status: None (Preliminary result)   Collection Time: 03/16/19  6:13 PM   Specimen: BLOOD  Result Value Ref Range Status   Specimen Description   Final    BLOOD LEFT ANTECUBITAL Performed at [facility] [address] [place], [place] [address]    Special Requests   Final    BOTTLES DRAWN AEROBIC AND ANAEROBIC Blood Culture adequate volume Performed at [facility] [address] [place], [place] [address]    Culture   Final    NO GROWTH 2 DAYS Performed at [facility] [address], [place], [place] [address]    Report Status PENDING  Incomplete  Urine culture     Status: Abnormal   Collection Time: 03/16/19  8:37 PM   Specimen: In/Out Cath Urine  Result Value Ref Range Status   Specimen Description   Final    IN/OUT CATH URINE Performed at [facility] [address] [place], [place]  [address]    Special Requests   Final    NONE Performed at [facility] [place] [address] [place], [place] [address]    Culture (A)  Final    >=100,000 COLONIES/mL KLEBSIELLA PNEUMONIAE Confirmed Extended Spectrum Beta-Lactamase Producer (ESBL).  In bloodstream infections from ESBL organisms, carbapenems are preferred over piperacillin/tazobactam. They are shown to have a lower risk of mortality.    Report Status 03/18/2019 FINAL  Final   Organism ID, Bacteria KLEBSIELLA PNEUMONIAE (A)  Final      Susceptibility   Klebsiella pneumoniae - MIC*    AMPICILLIN >=32 RESISTANT Resistant     CEFAZOLIN >=64 RESISTANT Resistant     CEFTRIAXONE >=64 RESISTANT Resistant     CIPROFLOXACIN 2 INTERMEDIATE Intermediate     GENTAMICIN >=16 RESISTANT Resistant     IMIPENEM <=0.25 SENSITIVE Sensitive     NITROFURANTOIN 128 RESISTANT Resistant     TRIMETH/SULFA >=320 RESISTANT Resistant     AMPICILLIN/SULBACTAM >=32 RESISTANT Resistant     PIP/TAZO 16 SENSITIVE Sensitive     Extended ESBL POSITIVE Resistant     * >=  100,000 COLONIES/mL KLEBSIELLA PNEUMONIAE  SARS Coronavirus 2 by RT PCR (hospital order, performed in [facility] hospital lab) Nasopharyngeal Nasopharyngeal Swab     Status: None   Collection Time: 03/16/19 10:11 PM   Specimen: Nasopharyngeal Swab  Result Value Ref Range Status   SARS Coronavirus 2 NEGATIVE NEGATIVE Final    Comment: (NOTE) If result is NEGATIVE SARS-CoV-2 target nucleic acids are NOT DETECTED. The SARS-CoV-2 RNA is generally detectable in upper and lower  respiratory specimens during the acute phase of infection. The lowest  concentration of SARS-CoV-2 viral copies this assay can detect is 250  copies / mL. A negative result does not preclude SARS-CoV-2 infection  and should not be used as the sole basis for treatment or other  patient management decisions.  A negative result may occur with  improper specimen collection / handling, submission of  specimen other  than nasopharyngeal swab, presence of viral mutation(s) within the  areas targeted by this assay, and inadequate number of viral copies  (<250 copies / mL). A negative result must be combined with clinical  observations, patient history, and epidemiological information. If result is POSITIVE SARS-CoV-2 target nucleic acids are DETECTED. The SARS-CoV-2 RNA is generally detectable in upper and lower  respiratory specimens dur ing the acute phase of infection.  Positive  results are indicative of active infection with SARS-CoV-2.  Clinical  correlation with patient history and other diagnostic information is  necessary to determine patient infection status.  Positive results do  not rule out bacterial infection or co-infection with other viruses. If result is PRESUMPTIVE POSTIVE SARS-CoV-2 nucleic acids MAY BE PRESENT.   A presumptive positive result was obtained on the submitted specimen  and confirmed on repeat testing.  While 2019 novel coronavirus  (SARS-CoV-2) nucleic acids may be present in the submitted sample  additional confirmatory testing may be necessary for epidemiological  and / or clinical management purposes  to differentiate between  SARS-CoV-2 and other Sarbecovirus currently known to infect humans.  If clinically indicated additional testing with an alternate test  methodology [phone]) is advised. The SARS-CoV-2 RNA is generally  detectable in upper and lower respiratory sp ecimens during the acute  phase of infection. The expected result is Negative. Fact Sheet for Patients:  StrictlyIdeas.no Fact Sheet for Healthcare Providers: BankingDealers.co.za This test is not yet approved or cleared by the [place] FDA and has been authorized for detection and/or diagnosis of SARS-CoV-2 by FDA under an Emergency Use Authorization (EUA).  This EUA will remain in effect (meaning this test can be used) for the  duration of the COVID-19 declaration under Section 564(b)(1) of the Act, 21 U.S.C. section 360bbb-3(b)(1), unless the authorization is terminated or revoked sooner. Performed at [facility], [place] [address], [place], [place] [address]     Radiology Reports Dg Chest 2 View  Result Date: 03/16/2019 CLINICAL DATA:  Weakness and fevers, sepsis EXAM: CHEST - 2 VIEW COMPARISON:  01/06/2019 FINDINGS: The heart size and mediastinal contours are within normal limits. Both lungs are clear. The visualized skeletal structures are unremarkable. Minimal left basilar scarring versus atelectasis. Trachea midline. Bilateral shoulder replacements noted. IMPRESSION: No acute chest process. Electronically Signed   By: Jerilynn Mages.  Shick M.D.   On: 03/16/2019 19:12   Ct Abdomen Pelvis W Contrast  Result Date: 03/16/2019 CLINICAL DATA:  73 year old male with fever of unknown origin. Renal stent placed 3 weeks ago. History of bladder cancer. EXAM: CT ABDOMEN AND PELVIS WITH CONTRAST TECHNIQUE: Multidetector  CT imaging of the abdomen and pelvis was performed using the standard protocol following bolus administration of intravenous contrast. CONTRAST:  139mL OMNIPAQUE IOHEXOL 300 MG/ML  SOLN COMPARISON:  CT Abdomen and Pelvis 01/02/2019. FINDINGS: Lower chest: Stable mild cardiomegaly. Negative lung bases, no effusion. Hepatobiliary: Chronic cholelithiasis with numerous gallstones. No pericholecystic inflammation. Negative liver. No bile duct enlargement. Pancreas: Negative. Spleen: Negative. Adrenals/Urinary Tract: Normal adrenal glands. Negative right kidney and proximal right ureter. Continued left renal inflammation, although partially regressed stranding of the kidney and left pararenal fat since 01/02/2019. Continued abnormal enhancement of the left renal cortex, and on delayed images no left renal contrast excretion is identified. Superimposed left nephroureteral stent has been placed. The distal aspect of  the stent is discussed below. No adverse features are identified at the left kidney or the skin site. Surgically absent bladder. Stomach/Bowel: Diverticulosis from the sigmoid colon into the descending colon. Large bowel retained stool and redundancy. No large bowel inflammation. Prior appendectomy (coronal image 67. Negative terminal ileum. Just upstream of the TI there is a small bowel anastomosis with no adverse features identified. There are no dilated loops of small bowel. An ileal conduit exits the skin in the right abdomen on series 2, image 49. A short distance upstream of the ileostomy loop (series 2, image 54) a ureteral stent terminates. This is an internal-external stent, which traverses the kidney as additional detailed above. Negative stomach.  No free air.  No free fluid. Vascular/Lymphatic: Major arterial structures are patent. Mild Aortoiliac calcified atherosclerosis. Portal venous system appears to be patent. No lymphadenopathy. Reproductive: Stable appearance of prior cystoprostatectomy. Small fat containing left inguinal hernia. Other: No pelvic free fluid. Musculoskeletal: Degenerative changes in the spine. No acute or suspicious osseous lesion. IMPRESSION: 1. Continued inflammation of the left kidney compatible with pyelonephritis, although the degree of left renal and pararenal stranding is perhaps mildly improved since August. A left nephroureteral stent has been placed. The distal aspect of the stent travels within the ileal conduit and terminates a several centimeters upstream of ileostomy, with no adverse features. 2. Right kidney remains normal. Stable appearance of cystoprostatectomy. 3. No other acute or inflammatory process. Cholelithiasis. Large bowel diverticulosis and retained stool. Electronically Signed   By: Genevie Ann M.D.   On: 03/16/2019 22:22   Dg C-arm 1-60 Min-no Report  Result Date: 02/17/2019 Fluoroscopy was utilized by the requesting physician.  No radiographic  interpretation.    Time Spent in minutes 35   Marshall Roehrich M.D on 03/18/2019 at 10:33 AM  Between 7am to 7pm - Pager - [phone]  After 7pm go to [email or web address] - password [facility]  [facility] -  Office  [phone]

## 2019-03-19 LAB — BASIC METABOLIC PANEL
Anion gap: 9 (ref 5–15)
BUN: 24 mg/dL — ABNORMAL HIGH (ref 8–23)
CO2: 21 mmol/L — ABNORMAL LOW (ref 22–32)
Calcium: 9.3 mg/dL (ref 8.9–10.3)
Chloride: 106 mmol/L (ref 98–111)
Creatinine, Ser: 1.27 mg/dL — ABNORMAL HIGH (ref 0.61–1.24)
GFR calc Af Amer: >60 mL/min (ref >60)
GFR calc non Af Amer: 56 mL/min — ABNORMAL LOW (ref >60)
Glucose, Bld: 96 mg/dL (ref 70–99)
Potassium: 3.9 mmol/L (ref 3.5–5.1)
Sodium: 136 mmol/L (ref 135–145)

## 2019-03-19 LAB — CBC
HCT: 27.3 % — ABNORMAL LOW (ref 39.0–52.0)
Hemoglobin: 8.3 g/dL — ABNORMAL LOW (ref 13.0–17.0)
MCH: 27.3 pg (ref 26.0–34.0)
MCHC: 30.4 g/dL (ref 30.0–36.0)
MCV: 89.8 fL (ref 80.0–100.0)
Platelets: 210 10*3/uL (ref 150–400)
RBC: 3.04 MIL/uL — ABNORMAL LOW (ref 4.22–5.81)
RDW: 17.3 % — ABNORMAL HIGH (ref 11.5–15.5)
WBC: 10.4 10*3/uL (ref 4.0–10.5)
nRBC: 0 % (ref 0.0–0.2)

## 2019-03-19 MED ORDER — ENSURE ENLIVE PO LIQD
237.0000 mL | Freq: Two times a day (BID) | ORAL | Status: DC
  Administered 2019-03-20 – 2019-03-21 (×2): 237 mL via ORAL

## 2019-03-19 MED ORDER — CHLORHEXIDINE GLUCONATE CLOTH 2 % EX PADS
6.0000 | MEDICATED_PAD | Freq: Every day | CUTANEOUS | Status: DC
  Administered 2019-03-19 – 2019-03-21 (×3): 6 via TOPICAL

## 2019-03-19 MED ORDER — SODIUM CHLORIDE 0.9 % IV SOLN
INTRAVENOUS | Status: AC
  Administered 2019-03-19: 12:00:00 via INTRAVENOUS

## 2019-03-19 NOTE — Progress Notes (Signed)
PROGRESS NOTE                                                                                                                                                                                                             Patient Demographics:    Billy Rasmussen, is a 73 y.o. male, DOB - 02/17/1946, [id number]  Admit date - 03/16/2019   Admitting Physician Rise Patience, MD  Outpatient Primary MD for the patient is Theodoro Clock  LOS - 3  Outpatient Specialists: Dr. Tresa Moore urology  Chief Complaint  Patient presents with  . Fever  . Fatigue       Brief Narrative 73 year old male with recurrent bladder cancer for which he underwent radical cystoprostatectomy with ileal conduit in June this year followed by hospitalization in August and September for sepsis with pyelonephritis requiring left ureteral stent for hydronephrosis presented to the ED with fevers, chills and flank pain.  Symptoms started the night before admission.  Denied any nausea, vomiting, diarrhea, chest pain, shortness of breath, headache, dysuria. In the ED he was septic with fever of 100.7 F, tachycardic and low systolic blood pressure in the 90s.  WBC of 19 point 6K, creatinine 1.5 and UA suggestive of UTI.  Sepsis pathway initiated and given IV fluid bolus following which blood pressure improved.  Tested for COVID-19 and was negative. CT of the abdomen pelvis showed findings of left-sided pyelonephritis without any obstruction.  Urine and blood culture sent and patient placed on empiric IV cefepime.    Subjective:   Afebrile past 24 hours.  Reports some back pain and belching.   Assessment  & Plan :    Principal Problem: Severe sepsis ([place]) Secondary to UTI with acute left-sided pyelonephritis.  Sepsis currently resolved.  Afebrile past 24 hours and WBC normalized.  Antibiotic switched to meropenem based on urine culture growing ESBL.   seen by his urologist who plans on removing his nephroureteral stent on 10/19 if patient is not septic.  If it fails, patient will need open revision electively. N.p.o. after midnight.   Active Problems:  Acute kidney injury ([place]) Likely prerenal ATN secondary to sepsis and pyelonephritis.  Lisinopril HCTZ held.  Will resume fluids today.  Essential hypertension Hypotensive due to sepsis on presentation.  Home meds (lisinopril and HCTZ) held    Malignant neoplasm  of urinary bladder ([place]) History of recurrent high-grade bladder cancer status post radical cystoprostatectomy.  Anemia of chronic disease Stable     Code Status : Full code  Family Communication  : None.  Wife involved in care  Disposition Plan  : Home once clinically improved, urological procedure planned for 10/19.  Barriers For Discharge : Active symptoms  Consults  : Urology  Procedures  : CT abdomen pelvis  DVT Prophylaxis  : Subcu Lovenox  Lab Results  Component Value Date   PLT 210 03/19/2019    Antibiotics  :    Anti-infectives (From admission, onward)   Start     Dose/Rate Route Frequency Ordered Stop   03/18/19 1415  meropenem (MERREM) 1 g in sodium chloride 0.9 % 100 mL IVPB     1 g 200 mL/hr over 30 Minutes Intravenous Every 8 hours 03/18/19 1407     03/17/19 1800  cefTRIAXone (ROCEPHIN) 2 g in sodium chloride 0.9 % 100 mL IVPB  Status:  Discontinued     2 g 200 mL/hr over 30 Minutes Intravenous Every 24 hours 03/16/19 2327 03/17/19 0634   03/17/19 0800  ceFEPIme (MAXIPIME) 2 g in sodium chloride 0.9 % 100 mL IVPB  Status:  Discontinued     2 g 200 mL/hr over 30 Minutes Intravenous Every 12 hours 03/17/19 0649 03/18/19 1407   03/16/19 2245  cefTRIAXone (ROCEPHIN) 1 g in sodium chloride 0.9 % 100 mL IVPB     1 g 200 mL/hr over 30 Minutes Intravenous  Once 03/16/19 2239 03/17/19 0021   03/16/19 2045  cefTRIAXone (ROCEPHIN) 1 g in sodium chloride 0.9 % 100 mL IVPB     1 g 200 mL/hr over 30  Minutes Intravenous  Once 03/16/19 2035 03/16/19 2209        Objective:   Vitals:   03/18/19 1528 03/18/19 2129 03/19/19 0500 03/19/19 0533  BP: 131/67 136/79  (!) 144/83  Pulse: 88 97  79  Resp: 20 18  16   Temp: 98.5 F (36.9 C) 99.1 F (37.3 C)  98.7 F (37.1 C)  TempSrc:  Oral  Oral  SpO2: 98% 96%  96%  Weight:   94.5 kg   Height:        Wt Readings from Last 3 Encounters:  03/19/19 94.5 kg  02/17/19 91.9 kg  02/09/19 95.2 kg     Intake/Output Summary (Last 24 hours) at 03/19/2019 1144 Last data filed at 03/19/2019 1100 Gross per 24 hour  Intake 966.41 ml  Output 3355 ml  Net -2388.59 ml   Physical exam Not in distress HEENT: Moist mucosa, supple neck Chest: Clear bilaterally CVs: Normal S1 and S2 GI: Soft, nondistended, nontender, left-sided new nephroureteral stent, bowel sounds present Musculoskeletal: Warm, no edema    Data Review:    CBC Recent Labs  Lab 03/16/19 1842 03/17/19 0057 03/17/19 0551 03/18/19 0554 03/19/19 0545  WBC 19.6* 18.1* 18.9* 18.3* 10.4  HGB 10.3* 8.5* 8.4* 8.9* 8.3*  HCT 32.4* 27.5* 26.6* 27.8* 27.3*  PLT 256 174 175 160 210  MCV 87.8 88.4 89.6 86.3 89.8  MCH 27.9 27.3 28.3 27.6 27.3  MCHC 31.8 30.9 31.6 32.0 30.4  RDW 17.2* 17.3* 17.4* 17.4* 17.3*  LYMPHSABS 0.5*  --  0.6*  --   --   MONOABS 1.0  --  1.5*  --   --   EOSABS 0.0  --  0.1  --   --   BASOSABS 0.0  --  0.0  --   --  Chemistries  Recent Labs  Lab 03/16/19 1842 03/17/19 0057 03/17/19 0551 03/18/19 0554 03/19/19 0545  NA 134*  --  137 139 136  K 4.3  --  3.6 4.7 3.9  CL 100  --  109 109 106  CO2 22  --  21* 21* 21*  GLUCOSE 180*  --  94 89 96  BUN 30*  --  25* 25* 24*  CREATININE 1.51* 1.19 1.36* 1.20 1.27*  CALCIUM 9.4  --  7.7* 9.2 9.3  AST 26  --  17  --   --   ALT 13  --  8  --   --   ALKPHOS 73  --  52  --   --   BILITOT 1.1  --  0.5  --   --     ------------------------------------------------------------------------------------------------------------------ No results for input(s): CHOL, HDL, LDLCALC, TRIG, CHOLHDL, LDLDIRECT in the last 72 hours.  No results found for: HGBA1C ------------------------------------------------------------------------------------------------------------------ No results for input(s): TSH, T4TOTAL, T3FREE, THYROIDAB in the last 72 hours.  Invalid input(s): FREET3 ------------------------------------------------------------------------------------------------------------------ No results for input(s): VITAMINB12, FOLATE, FERRITIN, TIBC, IRON, RETICCTPCT in the last 72 hours.  Coagulation profile Recent Labs  Lab 03/16/19 1842  INR 1.2    No results for input(s): DDIMER in the last 72 hours.  Cardiac Enzymes No results for input(s): CKMB, TROPONINI, MYOGLOBIN in the last 168 hours.  Invalid input(s): CK ------------------------------------------------------------------------------------------------------------------ No results found for: BNP  Inpatient Medications  Scheduled Meds: . Chlorhexidine Gluconate Cloth  6 each Topical Daily  . docusate sodium  200 mg Oral Daily  . heparin  5,000 Units Subcutaneous Q8H   Continuous Infusions: . meropenem (MERREM) IV Stopped (03/19/19 0548)   PRN Meds:.acetaminophen **OR** acetaminophen, meclizine, ondansetron **OR** ondansetron (ZOFRAN) IV  Micro Results Recent Results (from the past 240 hour(s))  Culture, blood (Routine x 2)     Status: None (Preliminary result)   Collection Time: 03/16/19  6:13 PM   Specimen: BLOOD  Result Value Ref Range Status   Specimen Description   Final    BLOOD LEFT ANTECUBITAL Performed at [facility] [address] [place], [place] [address]    Special Requests   Final    BOTTLES DRAWN AEROBIC AND ANAEROBIC Blood Culture adequate volume Performed at [facility] [address], [place], [place] [address]    Culture   Final    NO GROWTH 3 DAYS Performed at [facility], [place] [address] [place], [place] [address]    Report Status PENDING  Incomplete  Urine culture     Status: Abnormal   Collection Time: 03/16/19  8:37 PM   Specimen: In/Out Cath Urine  Result Value Ref Range Status   Specimen Description   Final    IN/OUT CATH URINE Performed at [facility] [address], [place], [place] [address]    Special Requests   Final    NONE Performed at [facility], [place] [address], [place],  [address]    Culture (A)  Final    >=100,000 COLONIES/mL KLEBSIELLA PNEUMONIAE Confirmed Extended Spectrum Beta-Lactamase Producer (ESBL).  In bloodstream infections from ESBL organisms, carbapenems are preferred over piperacillin/tazobactam. They are shown to have a lower risk of mortality.    Report Status 03/18/2019 FINAL  Final   Organism ID, Bacteria KLEBSIELLA PNEUMONIAE (A)  Final      Susceptibility   Klebsiella pneumoniae - MIC*    AMPICILLIN >=32 RESISTANT Resistant  CEFAZOLIN >=64 RESISTANT Resistant     CEFTRIAXONE >=64 RESISTANT Resistant     CIPROFLOXACIN 2 INTERMEDIATE Intermediate     GENTAMICIN >=16 RESISTANT Resistant     IMIPENEM <=0.25 SENSITIVE Sensitive     NITROFURANTOIN 128 RESISTANT Resistant     TRIMETH/SULFA >=320 RESISTANT Resistant     AMPICILLIN/SULBACTAM >=32 RESISTANT Resistant     PIP/TAZO 16 SENSITIVE Sensitive     Extended ESBL POSITIVE Resistant     * >=100,000 COLONIES/mL KLEBSIELLA PNEUMONIAE  SARS Coronavirus 2 by RT PCR (hospital order, performed in [facility] hospital lab) Nasopharyngeal Nasopharyngeal Swab     Status: None   Collection Time: 03/16/19 10:11 PM   Specimen: Nasopharyngeal Swab  Result Value Ref Range Status   SARS Coronavirus 2 NEGATIVE NEGATIVE Final    Comment: (NOTE) If result is NEGATIVE SARS-CoV-2 target nucleic acids are NOT DETECTED.  The SARS-CoV-2 RNA is generally detectable in upper and lower  respiratory specimens during the acute phase of infection. The lowest  concentration of SARS-CoV-2 viral copies this assay can detect is 250  copies / mL. A negative result does not preclude SARS-CoV-2 infection  and should not be used as the sole basis for treatment or other  patient management decisions.  A negative result may occur with  improper specimen collection / handling, submission of specimen other  than nasopharyngeal swab, presence of viral mutation(s) within the  areas targeted by this assay, and inadequate number of viral copies  (<250 copies / mL). A negative result must be combined with clinical  observations, patient history, and epidemiological information. If result is POSITIVE SARS-CoV-2 target nucleic acids are DETECTED. The SARS-CoV-2 RNA is generally detectable in upper and lower  respiratory specimens dur ing the acute phase of infection.  Positive  results are indicative of active infection with SARS-CoV-2.  Clinical  correlation with patient history and other diagnostic information is  necessary to determine patient infection status.  Positive results do  not rule out bacterial infection or co-infection with other viruses. If result is PRESUMPTIVE POSTIVE SARS-CoV-2 nucleic acids MAY BE PRESENT.   A presumptive positive result was obtained on the submitted specimen  and confirmed on repeat testing.  While 2019 novel coronavirus  (SARS-CoV-2) nucleic acids may be present in the submitted sample  additional confirmatory testing may be necessary for epidemiological  and / or clinical management purposes  to differentiate between  SARS-CoV-2 and other Sarbecovirus currently known to infect humans.  If clinically indicated additional testing with an alternate test  methodology [phone]) is advised. The SARS-CoV-2 RNA is generally  detectable in upper and lower respiratory sp ecimens during the acute   phase of infection. The expected result is Negative. Fact Sheet for Patients:  StrictlyIdeas.no Fact Sheet for Healthcare Providers: BankingDealers.co.za This test is not yet approved or cleared by the [place] FDA and has been authorized for detection and/or diagnosis of SARS-CoV-2 by FDA under an Emergency Use Authorization (EUA).  This EUA will remain in effect (meaning this test can be used) for the duration of the COVID-19 declaration under Section 564(b)(1) of the Act, 21 U.S.C. section 360bbb-3(b)(1), unless the authorization is terminated or revoked sooner. Performed at [facility], [place] [address] [place], [place] [address]     Radiology Reports Dg Chest 2 View  Result Date: 03/16/2019 CLINICAL DATA:  Weakness and fevers, sepsis EXAM: CHEST - 2 VIEW COMPARISON:  01/06/2019 FINDINGS: The heart size and mediastinal contours are within normal limits.  Both lungs are clear. The visualized skeletal structures are unremarkable. Minimal left basilar scarring versus atelectasis. Trachea midline. Bilateral shoulder replacements noted. IMPRESSION: No acute chest process. Electronically Signed   By: Jerilynn Mages.  Shick M.D.   On: 03/16/2019 19:12   Ct Abdomen Pelvis W Contrast  Result Date: 03/16/2019 CLINICAL DATA:  73 year old male with fever of unknown origin. Renal stent placed 3 weeks ago. History of bladder cancer. EXAM: CT ABDOMEN AND PELVIS WITH CONTRAST TECHNIQUE: Multidetector CT imaging of the abdomen and pelvis was performed using the standard protocol following bolus administration of intravenous contrast. CONTRAST:  15mL OMNIPAQUE IOHEXOL 300 MG/ML  SOLN COMPARISON:  CT Abdomen and Pelvis 01/02/2019. FINDINGS: Lower chest: Stable mild cardiomegaly. Negative lung bases, no effusion. Hepatobiliary: Chronic cholelithiasis with numerous gallstones. No pericholecystic inflammation. Negative liver. No bile duct enlargement.  Pancreas: Negative. Spleen: Negative. Adrenals/Urinary Tract: Normal adrenal glands. Negative right kidney and proximal right ureter. Continued left renal inflammation, although partially regressed stranding of the kidney and left pararenal fat since 01/02/2019. Continued abnormal enhancement of the left renal cortex, and on delayed images no left renal contrast excretion is identified. Superimposed left nephroureteral stent has been placed. The distal aspect of the stent is discussed below. No adverse features are identified at the left kidney or the skin site. Surgically absent bladder. Stomach/Bowel: Diverticulosis from the sigmoid colon into the descending colon. Large bowel retained stool and redundancy. No large bowel inflammation. Prior appendectomy (coronal image 67. Negative terminal ileum. Just upstream of the TI there is a small bowel anastomosis with no adverse features identified. There are no dilated loops of small bowel. An ileal conduit exits the skin in the right abdomen on series 2, image 49. A short distance upstream of the ileostomy loop (series 2, image 54) a ureteral stent terminates. This is an internal-external stent, which traverses the kidney as additional detailed above. Negative stomach.  No free air.  No free fluid. Vascular/Lymphatic: Major arterial structures are patent. Mild Aortoiliac calcified atherosclerosis. Portal venous system appears to be patent. No lymphadenopathy. Reproductive: Stable appearance of prior cystoprostatectomy. Small fat containing left inguinal hernia. Other: No pelvic free fluid. Musculoskeletal: Degenerative changes in the spine. No acute or suspicious osseous lesion. IMPRESSION: 1. Continued inflammation of the left kidney compatible with pyelonephritis, although the degree of left renal and pararenal stranding is perhaps mildly improved since August. A left nephroureteral stent has been placed. The distal aspect of the stent travels within the ileal conduit  and terminates a several centimeters upstream of ileostomy, with no adverse features. 2. Right kidney remains normal. Stable appearance of cystoprostatectomy. 3. No other acute or inflammatory process. Cholelithiasis. Large bowel diverticulosis and retained stool. Electronically Signed   By: Genevie Ann M.D.   On: 03/16/2019 22:22    Time Spent in minutes 35   Aloni Chuang M.D on 03/19/2019 at 11:44 AM  Between 7am to 7pm - Pager - [phone]  After 7pm go to [email or web address] - password [facility]  [facility] -  Office  [phone]

## 2019-03-19 NOTE — Progress Notes (Signed)
Initial Nutrition Assessment  DOCUMENTATION CODES:   Obesity unspecified  INTERVENTION:   -Ensure Enlive po BID, each supplement provides 350 kcal and 20 grams of protein  NUTRITION DIAGNOSIS:   Inadequate oral intake related to poor appetite as evidenced by meal completion < 50%.  GOAL:   Patient will meet greater than or equal to 90% of their needs  MONITOR:   PO intake, Supplement acceptance, Labs, Weight trends, I & O's  REASON FOR ASSESSMENT:   Malnutrition Screening Tool    ASSESSMENT:   73 year old male with recurrent bladder cancer for which he underwent radical cystoprostatectomy with ileal conduit in June this year followed by hospitalization in August and September for sepsis with pyelonephritis requiring left ureteral stent for hydronephrosis presented to the ED with fevers, chills and flank pain.  **RD working remotely**  Patient currently consuming 50% of meals. Per chart review, pt will be NPO 10/19 for nephroureteral stent removal.   Per weight records, pt has lost 25 lbs since 6/10 (10% wt loss x 4 months, significant for time frame).  I/Os: -342 ml UOP: 1075 ml so far today  Labs reviewed. Medications reviewed.  NUTRITION - FOCUSED PHYSICAL EXAM:  Unable to perform -working remotely.  Diet Order:   Diet Order            Diet NPO time specified Except for: Sips with Meds  Diet effective midnight        Diet Heart Room service appropriate? Yes; Fluid consistency: Thin  Diet effective now              EDUCATION NEEDS:   No education needs have been identified at this time  Skin:  Skin Assessment: Reviewed RN Assessment  Last BM:  10/17  Height:   Ht Readings from Last 1 Encounters:  03/16/19 5\' 6"  (1.676 m)    Weight:   Wt Readings from Last 1 Encounters:  03/19/19 94.5 kg    Ideal Body Weight:  64.5 kg  BMI:  Body mass index is 33.63 kg/m.  Estimated Nutritional Needs:   Kcal:  1800-2000  Protein:  80-90g  Fluid:   2L/day  Clayton Bibles, MS, RD, LDN Inpatient Clinical Dietitian Pager: [phone] After Hours Pager: [phone]

## 2019-03-19 NOTE — Progress Notes (Signed)
I concur with the previous RN assessment documentation.

## 2019-03-20 ENCOUNTER — Inpatient Hospital Stay: Payer: Self-pay

## 2019-03-20 DIAGNOSIS — I10 Essential (primary) hypertension: Secondary | ICD-10-CM

## 2019-03-20 LAB — BASIC METABOLIC PANEL
Anion gap: 9 (ref 5–15)
BUN: 22 mg/dL (ref 8–23)
CO2: 22 mmol/L (ref 22–32)
Calcium: 9.6 mg/dL (ref 8.9–10.3)
Chloride: 103 mmol/L (ref 98–111)
Creatinine, Ser: 1.16 mg/dL (ref 0.61–1.24)
GFR calc Af Amer: >60 mL/min (ref >60)
GFR calc non Af Amer: >60 mL/min (ref >60)
Glucose, Bld: 90 mg/dL (ref 70–99)
Potassium: 3.9 mmol/L (ref 3.5–5.1)
Sodium: 134 mmol/L — ABNORMAL LOW (ref 135–145)

## 2019-03-20 NOTE — Care Management Important Message (Signed)
Important Message  Patient Details IM Letter given to [name] SW to present to the Patient Name: Billy Rasmussen MRN: [id number] Date of Birth: 1946-05-09   Medicare Important Message Given:  Yes     [name] 03/20/2019, 10:38 AM

## 2019-03-20 NOTE — Progress Notes (Signed)
Subjective/Chief Complaint:  1 - Recurrent High Grade Bladder Cancer -s/p robotic cystoprostatectomy with ICG sentinal + template lymphadenctomy, extensive adhesions, and conduit diversion 10/2018 for [id number] cancer with NEGATIVE margins. BCG refractory disease prior. Urethra remains in site.   2 -Recurrent Pyelonephritis -treated 11/2018 and again 12/2018, 01/2019, and now 03/2019 for suspect pyelo on eval fevers and malaise. UCX 12/2018 pseudomonas pan-sensitive (cipro, gent). [name] 03/16/19 pending, placed on empiric Cefipime. UCX 03/2019 Klebsiella sens impapenum, zosyn and changed to merrem.   3-  Left Hydronephrosis Distal Partial Ureteral Stricture- mild left hydro on ER CT 12/2018 after cystoprostatectomy. Pyelo at time and therefore left neph tube placed. Antegrade ureteroscopy confirmed left distal partial stricture (long segemnt, appears to be from retrocolic tunnel to conduit, not anastamotic) and balloon dilated / antegrade stent placed which remains in place an in good positoin on CT this admission 10/15. Removed 10/19 as afebrile.   Today " Billy Rasmussen " is improving clinically. WBC trend down, fevers trend down. UCX with klebsiella ESBL and ABX adjusted.    Objective: Vital signs in last 24 hours: Temp:  [98.3 F (36.8 C)-99.5 F (37.5 C)] 99.5 F (37.5 C) (10/19 0540) Pulse Rate:  [76-83] 81 (10/19 0540) Resp:  [20] 20 (10/19 0540) BP: (107-164)/(85-90) 164/85 (10/19 0540) SpO2:  [99 %-100 %] 100 % (10/19 0540) Weight:  [94.2 kg] 94.2 kg (10/19 0540) Last BM Date: 03/19/19  Intake/Output from previous day: 10/18 0701 - 10/19 0700 In: 1889.7 [P.O.:600; I.V.:1091.9; IV Piggyback:197.9] Out: [id number] [Urine:3575] Intake/Output this shift: No intake/output data recorded.  General appearance: alert and wife at bedside.  Non-labored breathing on RA Normal HR RLQ Urostomy pink / patent of non-would urine and scant mucus. Left flank neproureterla stent capped, removed and  dry dressing placed, all non-aobsorbable suture removed.  No c/c/e, SCD's on.   Lab Results:  Recent Labs    03/18/19 0554 03/19/19 0545  WBC 18.3* 10.4  HGB 8.9* 8.3*  HCT 27.8* 27.3*  PLT 160 210   BMET Recent Labs    03/19/19 0545 03/20/19 0525  NA 136 134*  K 3.9 3.9  CL 106 103  CO2 21* 22  GLUCOSE 96 90  BUN 24* 22  CREATININE 1.27* 1.16  CALCIUM 9.3 9.6   PT/INR No results for input(s): LABPROT, INR in the last 72 hours. ABG No results for input(s): PHART, HCO3 in the last 72 hours.  Invalid input(s): PCO2, PO2  Studies/Results: No results found.  Anti-infectives: Anti-infectives (From admission, onward)   Start     Dose/Rate Route Frequency Ordered Stop   03/18/19 1415  meropenem (MERREM) 1 g in sodium chloride 0.9 % 100 mL IVPB     1 g 200 mL/hr over 30 Minutes Intravenous Every 8 hours 03/18/19 1407     03/17/19 1800  cefTRIAXone (ROCEPHIN) 2 g in sodium chloride 0.9 % 100 mL IVPB  Status:  Discontinued     2 g 200 mL/hr over 30 Minutes Intravenous Every 24 hours 03/16/19 2327 03/17/19 0634   03/17/19 0800  ceFEPIme (MAXIPIME) 2 g in sodium chloride 0.9 % 100 mL IVPB  Status:  Discontinued     2 g 200 mL/hr over 30 Minutes Intravenous Every 12 hours 03/17/19 0649 03/18/19 1407   03/16/19 2245  cefTRIAXone (ROCEPHIN) 1 g in sodium chloride 0.9 % 100 mL IVPB     1 g 200 mL/hr over 30 Minutes Intravenous  Once 03/16/19 2239 03/17/19 0021   03/16/19 2045  cefTRIAXone (  ROCEPHIN) 1 g in sodium chloride 0.9 % 100 mL IVPB     1 g 200 mL/hr over 30 Minutes Intravenous  Once 03/16/19 2035 03/16/19 2209      Assessment/Plan:  1 - Recurrent High Grade Bladder Cancer - good onoclogic prognosis, no further cancer directed therapy at this time.    2 -Recurrent Pyelonephritis -likely 2/2 partial left obstruction as per below, now stented. Agree with current merrum. Again, I feel fever free  24 hours minimum criteria for DC. His risk will hopefully be lower  now that left ureter draining well and stent free.   3-  Left Hydronephrosis Distal Partial Ureteral Stricture- now s/p endoscopic dilation. Left stent removed today. Again estimate 50% durability of patency s/p endoscopic treatment.   Alexis Frock 03/20/2019

## 2019-03-20 NOTE — Progress Notes (Addendum)
PROGRESS NOTE                                                                                                                                                                                                             Patient Demographics:    Billy Rasmussen, is a 73 y.o. male, DOB - 11-13-1945, TC:[id number]  Admit date - 03/16/2019   Admitting Physician Rise Patience, MD  Outpatient Primary MD for the patient is Theodoro Clock  LOS - 4  Outpatient Specialists: Dr. Tresa Moore urology  Chief Complaint  Patient presents with  . Fever  . Fatigue       Brief Narrative 73 year old male with recurrent bladder cancer for which he underwent radical cystoprostatectomy with ileal conduit in June this year followed by hospitalization in August and September for sepsis with pyelonephritis requiring left ureteral stent for hydronephrosis presented to the ED with fevers, chills and flank pain.  Symptoms started the night before admission.  Denied any nausea, vomiting, diarrhea, chest pain, shortness of breath, headache, dysuria. In the ED he was septic with fever of 100.7 F, tachycardic and low systolic blood pressure in the 90s.  WBC of 19 point 6K, creatinine 1.5 and UA suggestive of UTI.  Sepsis pathway initiated and given IV fluid bolus following which blood pressure improved.  Tested for COVID-19 and was negative. CT of the abdomen pelvis showed findings of left-sided pyelonephritis without any obstruction.  Urine and blood culture sent and patient placed on empiric IV cefepime.    Subjective:   Afebrile past 24 hours.  Reports some back pain and belching.   Assessment  & Plan :    Principal Problem: Severe sepsis ([place]) Secondary to UTI with acute left-sided pyelonephritis.  Sepsis currently resolved.  Afebrile past 24 hours and WBC normalized.  Antibiotic switched to meropenem based on urine culture growing ESBL.    Seen by his urologist and antegrade stent placed 2 months back was removed today.  Tolerated well.  Spoke with ID Dr. Johnnye Sima recommends minimum 5-7 days of IV antibiotic.  Ordered PICC line placement.  Should receive antibiotic until 10/22 to complete 7 days course.  Active Problems:  Acute kidney injury ([place]) Likely prerenal ATN secondary to sepsis and pyelonephritis.  Lisinopril HCTZ held.  Received IV fluids.  Renal function improved today.  Essential  hypertension Hypotensive due to sepsis on presentation.  Home meds (lisinopril and HCTZ) held.  Blood pressure currently stable.    Malignant neoplasm of urinary bladder ([facility]) History of recurrent high-grade bladder cancer status post radical cystoprostatectomy.  Anemia of chronic disease Stable     Code Status : Full code  Family Communication  : None.  Wife involved in care  Disposition Plan  : Home possibly tomorrow if remains afebrile and PICC line placed  Barriers For Discharge : Active symptoms  Consults  : Urology  Procedures  : CT abdomen pelvis  DVT Prophylaxis  : Subcu Lovenox  Lab Results  Component Value Date   PLT 210 03/19/2019    Antibiotics  :    Anti-infectives (From admission, onward)   Start     Dose/Rate Route Frequency Ordered Stop   03/18/19 1415  meropenem (MERREM) 1 g in sodium chloride 0.9 % 100 mL IVPB     1 g 200 mL/hr over 30 Minutes Intravenous Every 8 hours 03/18/19 1407     03/17/19 1800  cefTRIAXone (ROCEPHIN) 2 g in sodium chloride 0.9 % 100 mL IVPB  Status:  Discontinued     2 g 200 mL/hr over 30 Minutes Intravenous Every 24 hours 03/16/19 2327 03/17/19 0634   03/17/19 0800  ceFEPIme (MAXIPIME) 2 g in sodium chloride 0.9 % 100 mL IVPB  Status:  Discontinued     2 g 200 mL/hr over 30 Minutes Intravenous Every 12 hours 03/17/19 0649 03/18/19 1407   03/16/19 2245  cefTRIAXone (ROCEPHIN) 1 g in sodium chloride 0.9 % 100 mL IVPB     1 g 200 mL/hr over 30 Minutes Intravenous  Once  03/16/19 2239 03/17/19 0021   03/16/19 2045  cefTRIAXone (ROCEPHIN) 1 g in sodium chloride 0.9 % 100 mL IVPB     1 g 200 mL/hr over 30 Minutes Intravenous  Once 03/16/19 2035 03/16/19 2209        Objective:   Vitals:   03/19/19 0533 03/19/19 1424 03/19/19 2133 03/20/19 0540  BP: (!) 144/83 107/85 (!) 154/90 (!) 164/85  Pulse: 79 83 76 81  Resp: 16  20 20   Temp: 98.7 F (37.1 C) 98.3 F (36.8 C) 98.4 F (36.9 C) 99.5 F (37.5 C)  TempSrc: Oral Oral Oral Oral  SpO2: 96% 99% 100% 100%  Weight:    94.2 kg  Height:        Wt Readings from Last 3 Encounters:  03/20/19 94.2 kg  02/17/19 91.9 kg  02/09/19 95.2 kg     Intake/Output Summary (Last 24 hours) at 03/20/2019 1339 Last data filed at 03/20/2019 0521 Gross per 24 hour  Intake 1671.89 ml  Output 2800 ml  Net -1128.11 ml   Physical exam Not in distress HEENT: Moist mucosa, supple neck Chest clear CVS: Normal S1-S2 GI: Soft, nontender, right-sided urostomy, left-sided nephroureteral stent,     Data Review:    CBC Recent Labs  Lab 03/16/19 1842 03/17/19 0057 03/17/19 0551 03/18/19 0554 03/19/19 0545  WBC 19.6* 18.1* 18.9* 18.3* 10.4  HGB 10.3* 8.5* 8.4* 8.9* 8.3*  HCT 32.4* 27.5* 26.6* 27.8* 27.3*  PLT 256 174 175 160 210  MCV 87.8 88.4 89.6 86.3 89.8  MCH 27.9 27.3 28.3 27.6 27.3  MCHC 31.8 30.9 31.6 32.0 30.4  RDW 17.2* 17.3* 17.4* 17.4* 17.3*  LYMPHSABS 0.5*  --  0.6*  --   --   MONOABS 1.0  --  1.5*  --   --  EOSABS 0.0  --  0.1  --   --   BASOSABS 0.0  --  0.0  --   --     Chemistries  Recent Labs  Lab 03/16/19 1842 03/17/19 0057 03/17/19 0551 03/18/19 0554 03/19/19 0545 03/20/19 0525  NA 134*  --  137 139 136 134*  K 4.3  --  3.6 4.7 3.9 3.9  CL 100  --  109 109 106 103  CO2 22  --  21* 21* 21* 22  GLUCOSE 180*  --  94 89 96 90  BUN 30*  --  25* 25* 24* 22  CREATININE 1.51* 1.19 1.36* 1.20 1.27* 1.16  CALCIUM 9.4  --  7.7* 9.2 9.3 9.6  AST 26  --  17  --   --   --   ALT 13   --  8  --   --   --   ALKPHOS 73  --  52  --   --   --   BILITOT 1.1  --  0.5  --   --   --    ------------------------------------------------------------------------------------------------------------------ No results for input(s): CHOL, HDL, LDLCALC, TRIG, CHOLHDL, LDLDIRECT in the last 72 hours.  No results found for: HGBA1C ------------------------------------------------------------------------------------------------------------------ No results for input(s): TSH, T4TOTAL, T3FREE, THYROIDAB in the last 72 hours.  Invalid input(s): FREET3 ------------------------------------------------------------------------------------------------------------------ No results for input(s): VITAMINB12, FOLATE, FERRITIN, TIBC, IRON, RETICCTPCT in the last 72 hours.  Coagulation profile Recent Labs  Lab 03/16/19 1842  INR 1.2    No results for input(s): DDIMER in the last 72 hours.  Cardiac Enzymes No results for input(s): CKMB, TROPONINI, MYOGLOBIN in the last 168 hours.  Invalid input(s): CK ------------------------------------------------------------------------------------------------------------------ No results found for: BNP  Inpatient Medications  Scheduled Meds: . Chlorhexidine Gluconate Cloth  6 each Topical Daily  . docusate sodium  200 mg Oral Daily  . feeding supplement (ENSURE ENLIVE)  237 mL Oral BID BM  . heparin  5,000 Units Subcutaneous Q8H   Continuous Infusions: . meropenem (MERREM) IV 1 g (03/20/19 0521)   PRN Meds:.acetaminophen **OR** acetaminophen, meclizine, ondansetron **OR** ondansetron (ZOFRAN) IV  Micro Results Recent Results (from the past 240 hour(s))  Culture, blood (Routine x 2)     Status: None (Preliminary result)   Collection Time: 03/16/19  6:13 PM   Specimen: BLOOD  Result Value Ref Range Status   Specimen Description   Final    BLOOD LEFT ANTECUBITAL Performed at [facility] [address], [place], [place]  [address]    Special Requests   Final    BOTTLES DRAWN AEROBIC AND ANAEROBIC Blood Culture adequate volume Performed at [facility] [address] [place], [place] [address]    Culture   Final    NO GROWTH 4 DAYS Performed at [facility], [place] [address] [place], [place] [address]    Report Status PENDING  Incomplete  Urine culture     Status: Abnormal   Collection Time: 03/16/19  8:37 PM   Specimen: In/Out Cath Urine  Result Value Ref Range Status   Specimen Description   Final    IN/OUT CATH URINE Performed at [facility] [address], [place], [place] [address]    Special Requests   Final    NONE Performed at [facility], [place] [address], [place], [place] [address]    Culture (A)  Final    >=100,000 COLONIES/mL KLEBSIELLA PNEUMONIAE Confirmed Extended Spectrum Beta-Lactamase  Producer (ESBL).  In bloodstream infections from ESBL organisms, carbapenems are preferred over piperacillin/tazobactam. They are shown to have a lower risk of mortality.    Report Status 03/18/2019 FINAL  Final   Organism ID, Bacteria KLEBSIELLA PNEUMONIAE (A)  Final      Susceptibility   Klebsiella pneumoniae - MIC*    AMPICILLIN >=32 RESISTANT Resistant     CEFAZOLIN >=64 RESISTANT Resistant     CEFTRIAXONE >=64 RESISTANT Resistant     CIPROFLOXACIN 2 INTERMEDIATE Intermediate     GENTAMICIN >=16 RESISTANT Resistant     IMIPENEM <=0.25 SENSITIVE Sensitive     NITROFURANTOIN 128 RESISTANT Resistant     TRIMETH/SULFA >=320 RESISTANT Resistant     AMPICILLIN/SULBACTAM >=32 RESISTANT Resistant     PIP/TAZO 16 SENSITIVE Sensitive     Extended ESBL POSITIVE Resistant     * >=100,000 COLONIES/mL KLEBSIELLA PNEUMONIAE  SARS Coronavirus 2 by RT PCR (hospital order, performed in [place] hospital lab) Nasopharyngeal Nasopharyngeal Swab     Status: None   Collection Time: 03/16/19 10:11 PM   Specimen: Nasopharyngeal Swab  Result Value  Ref Range Status   SARS Coronavirus 2 NEGATIVE NEGATIVE Final    Comment: (NOTE) If result is NEGATIVE SARS-CoV-2 target nucleic acids are NOT DETECTED. The SARS-CoV-2 RNA is generally detectable in upper and lower  respiratory specimens during the acute phase of infection. The lowest  concentration of SARS-CoV-2 viral copies this assay can detect is 250  copies / mL. A negative result does not preclude SARS-CoV-2 infection  and should not be used as the sole basis for treatment or other  patient management decisions.  A negative result may occur with  improper specimen collection / handling, submission of specimen other  than nasopharyngeal swab, presence of viral mutation(s) within the  areas targeted by this assay, and inadequate number of viral copies  (<250 copies / mL). A negative result must be combined with clinical  observations, patient history, and epidemiological information. If result is POSITIVE SARS-CoV-2 target nucleic acids are DETECTED. The SARS-CoV-2 RNA is generally detectable in upper and lower  respiratory specimens dur ing the acute phase of infection.  Positive  results are indicative of active infection with SARS-CoV-2.  Clinical  correlation with patient history and other diagnostic information is  necessary to determine patient infection status.  Positive results do  not rule out bacterial infection or co-infection with other viruses. If result is PRESUMPTIVE POSTIVE SARS-CoV-2 nucleic acids MAY BE PRESENT.   A presumptive positive result was obtained on the submitted specimen  and confirmed on repeat testing.  While 2019 novel coronavirus  (SARS-CoV-2) nucleic acids may be present in the submitted sample  additional confirmatory testing may be necessary for epidemiological  and / or clinical management purposes  to differentiate between  SARS-CoV-2 and other Sarbecovirus currently known to infect humans.  If clinically indicated additional testing with an  alternate test  methodology [phone]) is advised. The SARS-CoV-2 RNA is generally  detectable in upper and lower respiratory sp ecimens during the acute  phase of infection. The expected result is Negative. Fact Sheet for Patients:  StrictlyIdeas.no Fact Sheet for Healthcare Providers: BankingDealers.co.za This test is not yet approved or cleared by the [place] FDA and has been authorized for detection and/or diagnosis of SARS-CoV-2 by FDA under an Emergency Use Authorization (EUA).  This EUA will remain in effect (meaning this test can be used) for the duration of the COVID-19 declaration under Section 564(b)(1) of the  Act, 21 U.S.C. section 360bbb-3(b)(1), unless the authorization is terminated or revoked sooner. Performed at [facility], [place] [address], [place], [place] [address]     Radiology Reports Dg Chest 2 View  Result Date: 03/16/2019 CLINICAL DATA:  Weakness and fevers, sepsis EXAM: CHEST - 2 VIEW COMPARISON:  01/06/2019 FINDINGS: The heart size and mediastinal contours are within normal limits. Both lungs are clear. The visualized skeletal structures are unremarkable. Minimal left basilar scarring versus atelectasis. Trachea midline. Bilateral shoulder replacements noted. IMPRESSION: No acute chest process. Electronically Signed   By: Jerilynn Mages.  Shick M.D.   On: 03/16/2019 19:12   Ct Abdomen Pelvis W Contrast  Result Date: 03/16/2019 CLINICAL DATA:  73 year old male with fever of unknown origin. Renal stent placed 3 weeks ago. History of bladder cancer. EXAM: CT ABDOMEN AND PELVIS WITH CONTRAST TECHNIQUE: Multidetector CT imaging of the abdomen and pelvis was performed using the standard protocol following bolus administration of intravenous contrast. CONTRAST:  14mL OMNIPAQUE IOHEXOL 300 MG/ML  SOLN COMPARISON:  CT Abdomen and Pelvis 01/02/2019. FINDINGS: Lower chest: Stable mild cardiomegaly. Negative lung  bases, no effusion. Hepatobiliary: Chronic cholelithiasis with numerous gallstones. No pericholecystic inflammation. Negative liver. No bile duct enlargement. Pancreas: Negative. Spleen: Negative. Adrenals/Urinary Tract: Normal adrenal glands. Negative right kidney and proximal right ureter. Continued left renal inflammation, although partially regressed stranding of the kidney and left pararenal fat since 01/02/2019. Continued abnormal enhancement of the left renal cortex, and on delayed images no left renal contrast excretion is identified. Superimposed left nephroureteral stent has been placed. The distal aspect of the stent is discussed below. No adverse features are identified at the left kidney or the skin site. Surgically absent bladder. Stomach/Bowel: Diverticulosis from the sigmoid colon into the descending colon. Large bowel retained stool and redundancy. No large bowel inflammation. Prior appendectomy (coronal image 67. Negative terminal ileum. Just upstream of the TI there is a small bowel anastomosis with no adverse features identified. There are no dilated loops of small bowel. An ileal conduit exits the skin in the right abdomen on series 2, image 49. A short distance upstream of the ileostomy loop (series 2, image 54) a ureteral stent terminates. This is an internal-external stent, which traverses the kidney as additional detailed above. Negative stomach.  No free air.  No free fluid. Vascular/Lymphatic: Major arterial structures are patent. Mild Aortoiliac calcified atherosclerosis. Portal venous system appears to be patent. No lymphadenopathy. Reproductive: Stable appearance of prior cystoprostatectomy. Small fat containing left inguinal hernia. Other: No pelvic free fluid. Musculoskeletal: Degenerative changes in the spine. No acute or suspicious osseous lesion. IMPRESSION: 1. Continued inflammation of the left kidney compatible with pyelonephritis, although the degree of left renal and pararenal  stranding is perhaps mildly improved since August. A left nephroureteral stent has been placed. The distal aspect of the stent travels within the ileal conduit and terminates a several centimeters upstream of ileostomy, with no adverse features. 2. Right kidney remains normal. Stable appearance of cystoprostatectomy. 3. No other acute or inflammatory process. Cholelithiasis. Large bowel diverticulosis and retained stool. Electronically Signed   By: Genevie Ann M.D.   On: 03/16/2019 22:22    Time Spent in minutes 35   Zhion Pevehouse M.D on 03/20/2019 at 1:39 PM  Between 7am to 7pm - Pager - [phone]  After 7pm go to [email or web address] - password [facility]  [facility] -  Office  [phone]

## 2019-03-20 NOTE — Progress Notes (Signed)
PHARMACY CONSULT NOTE FOR:  OUTPATIENT  PARENTERAL ANTIBIOTIC THERAPY (OPAT)  Indication: ESBL UTI with sepsis Regimen: Meropenem 1gm IV q8hr End date: 03/23/2019  IV antibiotic discharge orders are pended. To discharging provider:  please sign these orders via discharge navigator,  Select New Orders & click on the button choice - Manage This Unsigned Work.     Thank you for allowing pharmacy to be a part of this patient's care.  Minda Ditto PharmD 03/20/2019, 2:28 PM

## 2019-03-21 LAB — CULTURE, BLOOD (ROUTINE X 2)
Culture: NO GROWTH
Special Requests: ADEQUATE

## 2019-03-21 MED ORDER — SODIUM CHLORIDE 0.9% FLUSH: 10.0000 mL | INTRAVENOUS | Status: DC | PRN

## 2019-03-21 MED ORDER — SODIUM CHLORIDE 0.9% FLUSH: 10.0000 mL | Freq: Two times a day (BID) | INTRAVENOUS | Status: DC

## 2019-03-21 MED ORDER — SODIUM CHLORIDE 0.9 % IV SOLN
1.0000 g | INTRAVENOUS | Status: DC
  Administered 2019-03-21: 1000 mg via INTRAVENOUS
  Filled 2019-03-21: qty 1

## 2019-03-21 MED ORDER — ENSURE ENLIVE PO LIQD: 237.0000 mL | Freq: Two times a day (BID) | ORAL | 12 refills | Status: DC

## 2019-03-21 MED ORDER — ERTAPENEM IV (FOR PTA / DISCHARGE USE ONLY): 1.0000 g | INTRAVENOUS | 0 refills | Status: AC

## 2019-03-21 MED ORDER — HEPARIN SOD (PORK) LOCK FLUSH 100 UNIT/ML IV SOLN
250.0000 [IU] | INTRAVENOUS | Status: AC | PRN
  Administered 2019-03-21: 250 [IU]
  Filled 2019-03-21: qty 2.5

## 2019-03-21 NOTE — Discharge Summary (Addendum)
Physician Discharge Summary  Billy Rasmussen [id number] DOB: Mar 31, 1946 DOA: 03/16/2019  PCP: Terald Sleeper, PA-C  Admit date: 03/16/2019 Discharge date: 03/21/2019  Admitted From: Home Disposition: Home  Recommendations for Outpatient Follow-up:  1. Follow-up with PCP in 1 week and neurologist as scheduled 2. Patient will complete total 7 days of IV antibiotics after 10/22  Home Health: RN and PT Equipment/Devices: Walker  Discharge Condition: Fair CODE STATUS: Full code Diet recommendation: Low-sodium   Discharge Diagnoses:  Principal Problem: Severe sepsis with acute organ dysfunction ([place])  Active Problems:   HTN (hypertension)   Malignant neoplasm of urinary bladder (HCC)   Acute pyelonephritis Acute kidney injury Klebsiella ESBL UTI  Brief narrative/HPI 73 year old male with recurrent bladder cancer for which he underwent radical cystoprostatectomy with ileal conduit in June this year followed by hospitalization in August and September for sepsis with pyelonephritis requiring left ureteral stent for hydronephrosis presented to the ED with fevers, chills and flank pain.  Symptoms started the night before admission.  Denied any nausea, vomiting, diarrhea, chest pain, shortness of breath, headache, dysuria. In the ED he was septic with fever of 100.7 F, tachycardic and low systolic blood pressure in the 90s.  WBC of 19 point 6K, creatinine 1.5 and UA suggestive of UTI.  Sepsis pathway initiated and given IV fluid bolus following which blood pressure improved.  Tested for COVID-19 and was negative. CT of the abdomen pelvis showed findings of left-sided pyelonephritis without any obstruction.  Urine and blood culture sent and patient placed on empiric IV cefepime.  Hospital course   Principal Problem: Severe sepsis ([place]) Secondary to UTI with acute left-sided pyelonephritis.  Sepsis  resolved.  Remains afebrile and WBC normalized. Urine culture growing Klebsiella  ESBL.  Blood cultures negative.  Seen by his urologist and antegrade stent placed 2 months back was removed on 10/19.  Tolerated well.Marland Kitchen   Spoke with ID Dr. Johnnye Sima recommends minimum 5-7 days of IV antibiotic.  Ordered PICC line placement.    Will discharge on IV ertapenem once daily until 10/22 to complete 7-day course of antibiotic.  PICC line to be removed by home health after completion of antibiotic.  Active Problems:  Acute kidney injury ([place]) Likely prerenal ATN secondary to sepsis and pyelonephritis.  Lisinopril HCTZ held while in the hospital.  Patient also on diclofenac twice daily for arthritis chronically. Renal function stable with fluids.  Resume all medications.  Monitor renal function closely as outpatient.  Essential hypertension Blood pressure meds held due to hypotension on presentation.  Resume upon discharge    Malignant neoplasm of urinary bladder (HCC) History of recurrent high-grade bladder cancer status post radical cystoprostatectomy.  Anemia of chronic disease Stable    Family Communication  : None.  Wife involved in care  Disposition Plan  : Home     Consults  : Urology  Procedures  : CT abdomen pelvis  Discharge Instructions  Discharge Instructions    Home infusion instructions [facility] May follow [place] Dosing Protocol; May administer Cathflo as needed to maintain patency of vascular access device.; Flushing of vascular access device: per [facility] Protocol: 0.9% NaCl pre/post medica...   Complete by: As directed    Instructions: May follow [place] Dosing Protocol   Instructions: May administer Cathflo as needed to maintain patency of vascular access device.   Instructions: Flushing of vascular access device: per [facility] Protocol: 0.9% NaCl pre/post medication administration and prn patency; Heparin 100 u/ml, 77ml for implanted ports  and Heparin 10u/ml, 30ml for all other central venous catheters.   Instructions: May follow  [facility] Anaphylaxis Protocol for First Dose Administration in the home: 0.9% NaCl at 25-50 ml/hr to maintain IV access for protocol meds. Epinephrine 0.3 ml IV/IM PRN and Benadryl 25-50 IV/IM PRN s/s of anaphylaxis.   Instructions: [place] Infusion Coordinator (RN) to assist per patient IV care needs in the home PRN.   Home infusion instructions [facility] May follow [place] Dosing Protocol; May administer Cathflo as needed to maintain patency of vascular access device.; Flushing of vascular access device: per [facility] Protocol: 0.9% NaCl pre/post medica...   Complete by: As directed    Instructions: May follow [place] Dosing Protocol   Instructions: May administer Cathflo as needed to maintain patency of vascular access device.   Instructions: Flushing of vascular access device: per [facility] Protocol: 0.9% NaCl pre/post medication administration and prn patency; Heparin 100 u/ml, 54ml for implanted ports and Heparin 10u/ml, 40ml for all other central venous catheters.   Instructions: May follow [facility] Anaphylaxis Protocol for First Dose Administration in the home: 0.9% NaCl at 25-50 ml/hr to maintain IV access for protocol meds. Epinephrine 0.3 ml IV/IM PRN and Benadryl 25-50 IV/IM PRN s/s of anaphylaxis.   Instructions: [place] Infusion Coordinator (RN) to assist per patient IV care needs in the home PRN.     Allergies as of 03/21/2019   No Known Allergies     Medication List    STOP taking these medications   ciprofloxacin 500 MG tablet Commonly known as: CIPRO   traMADol 50 MG tablet Commonly known as: Ultram     TAKE these medications   ANTI MONKEY BUTT EX Apply 1 application topically daily as needed (irritation).   CALCIUM 600 + D PO Take 1 tablet by mouth daily.   diclofenac 75 MG EC tablet Commonly known as: VOLTAREN TAKE 1 TABLET BY MOUTH TWICE A DAY   docusate sodium 100 MG capsule Commonly known as: COLACE Take 200 mg by mouth daily.    ertapenem  IVPB Commonly known as: INVANZ Inject 1 g into the vein daily for 1 day. Indication: ESBL UTI with sepsis Last Day of Therapy:  03/23/2019 Start taking on: March 22, 2019   feeding supplement (ENSURE ENLIVE) Liqd Take 237 mLs by mouth 2 (two) times daily between meals.   lisinopril-hydrochlorothiazide 20-12.5 MG tablet Commonly known as: ZESTORETIC TAKE 1 TABLET BY MOUTH EVERY DAY   meclizine 25 MG tablet Commonly known as: ANTIVERT Take 25 mg by mouth 3 (three) times daily as needed for dizziness.   multivitamin with minerals Tabs tablet Take 1 tablet by mouth daily.   SYSTANE ULTRA OP Place 1 drop into both eyes daily as needed (dry eyes).   Zinc 50 MG Caps Take 50 mg by mouth daily.            Home Infusion Instuctions  (From admission, onward)         Start     Ordered   03/21/19 0000  Home infusion instructions [facility] May follow [place] Dosing Protocol; May administer Cathflo as needed to maintain patency of vascular access device.; Flushing of vascular access device: per [facility] Protocol: 0.9% NaCl pre/post medica...    Question Answer Comment  Instructions May follow [place] Dosing Protocol   Instructions May administer Cathflo as needed to maintain patency of vascular access device.   Instructions Flushing of vascular access device: per [facility]  Protocol: 0.9% NaCl pre/post medication administration and prn patency; Heparin 100 u/ml, 49ml for implanted ports and Heparin 10u/ml, 55ml for all other central venous catheters.   Instructions May follow [facility] Anaphylaxis Protocol for First Dose Administration in the home: 0.9% NaCl at 25-50 ml/hr to maintain IV access for protocol meds. Epinephrine 0.3 ml IV/IM PRN and Benadryl 25-50 IV/IM PRN s/s of anaphylaxis.   Instructions [facility] Infusion Coordinator (RN) to assist per patient IV care needs in the home PRN.      03/21/19 1136   03/20/19 0000  Home infusion instructions [facility] May follow [place] Dosing Protocol; May administer Cathflo as needed to maintain patency of vascular access device.; Flushing of vascular access device: per [facility] Protocol: 0.9% NaCl pre/post medica...    Question Answer Comment  Instructions May follow [place] Dosing Protocol   Instructions May administer Cathflo as needed to maintain patency of vascular access device.   Instructions Flushing of vascular access device: per [facility] Protocol: 0.9% NaCl pre/post medication administration and prn patency; Heparin 100 u/ml, 77ml for implanted ports and Heparin 10u/ml, 64ml for all other central venous catheters.   Instructions May follow [facility] Anaphylaxis Protocol for First Dose Administration in the home: 0.9% NaCl at 25-50 ml/hr to maintain IV access for protocol meds. Epinephrine 0.3 ml IV/IM PRN and Benadryl 25-50 IV/IM PRN s/s of anaphylaxis.   Instructions [facility] Infusion Coordinator (RN) to assist per patient IV care needs in the home PRN.      03/20/19 1407         Follow-up Information    Terald Sleeper, PA-C. Schedule an appointment as soon as possible for a visit in 1 week(s).   Specialties: Physician Assistant, Family Medicine Contact information: [place] [address] [phone]          No Known Allergies      Procedures/Studies: Dg Chest 2 View  Result Date: 03/16/2019 CLINICAL DATA:  Weakness and fevers, sepsis EXAM: CHEST - 2 VIEW COMPARISON:  01/06/2019 FINDINGS: The heart size and mediastinal contours are within normal limits. Both lungs are clear. The visualized skeletal structures are unremarkable. Minimal left basilar scarring versus atelectasis. Trachea midline. Bilateral shoulder replacements noted. IMPRESSION: No acute chest process. Electronically Signed   By: Jerilynn Mages.  Shick M.D.   On: 03/16/2019 19:12   Ct Abdomen Pelvis W Contrast  Result Date: 03/16/2019 CLINICAL DATA:  73 year old male with fever of unknown origin.  Renal stent placed 3 weeks ago. History of bladder cancer. EXAM: CT ABDOMEN AND PELVIS WITH CONTRAST TECHNIQUE: Multidetector CT imaging of the abdomen and pelvis was performed using the standard protocol following bolus administration of intravenous contrast. CONTRAST:  117mL OMNIPAQUE IOHEXOL 300 MG/ML  SOLN COMPARISON:  CT Abdomen and Pelvis 01/02/2019. FINDINGS: Lower chest: Stable mild cardiomegaly. Negative lung bases, no effusion. Hepatobiliary: Chronic cholelithiasis with numerous gallstones. No pericholecystic inflammation. Negative liver. No bile duct enlargement. Pancreas: Negative. Spleen: Negative. Adrenals/Urinary Tract: Normal adrenal glands. Negative right kidney and proximal right ureter. Continued left renal inflammation, although partially regressed stranding of the kidney and left pararenal fat since 01/02/2019. Continued abnormal enhancement of the left renal cortex, and on delayed images no left renal contrast excretion is identified. Superimposed left nephroureteral stent has been placed. The distal aspect of the stent is discussed below. No adverse features are identified at the left kidney or the skin site. Surgically absent bladder. Stomach/Bowel: Diverticulosis from the sigmoid colon into the  descending colon. Large bowel retained stool and redundancy. No large bowel inflammation. Prior appendectomy (coronal image 67. Negative terminal ileum. Just upstream of the TI there is a small bowel anastomosis with no adverse features identified. There are no dilated loops of small bowel. An ileal conduit exits the skin in the right abdomen on series 2, image 49. A short distance upstream of the ileostomy loop (series 2, image 54) a ureteral stent terminates. This is an internal-external stent, which traverses the kidney as additional detailed above. Negative stomach.  No free air.  No free fluid. Vascular/Lymphatic: Major arterial structures are patent. Mild Aortoiliac calcified atherosclerosis.  Portal venous system appears to be patent. No lymphadenopathy. Reproductive: Stable appearance of prior cystoprostatectomy. Small fat containing left inguinal hernia. Other: No pelvic free fluid. Musculoskeletal: Degenerative changes in the spine. No acute or suspicious osseous lesion. IMPRESSION: 1. Continued inflammation of the left kidney compatible with pyelonephritis, although the degree of left renal and pararenal stranding is perhaps mildly improved since August. A left nephroureteral stent has been placed. The distal aspect of the stent travels within the ileal conduit and terminates a several centimeters upstream of ileostomy, with no adverse features. 2. Right kidney remains normal. Stable appearance of cystoprostatectomy. 3. No other acute or inflammatory process. Cholelithiasis. Large bowel diverticulosis and retained stool. Electronically Signed   By: Genevie Ann M.D.   On: 03/16/2019 22:22   [place] Ekg Site Rite  Result Date: 03/20/2019 If Site Rite image not attached, placement could not be confirmed due to current cardiac rhythm.      Subjective: Afebrile.  Denies any flank pain.  No overnight events.  Discharge Exam: Vitals:   03/20/19 2007 03/21/19 0520  BP: 136/83 (!) 156/90  Pulse: (!) 105 75  Resp: 16 16  Temp: 98.4 F (36.9 C) 98.2 F (36.8 C)  SpO2: 99% 100%   Vitals:   03/20/19 1537 03/20/19 2007 03/21/19 0500 03/21/19 0520  BP: (!) 144/79 136/83  (!) 156/90  Pulse: 92 (!) 105  75  Resp:  16  16  Temp: 98.9 F (37.2 C) 98.4 F (36.9 C)  98.2 F (36.8 C)  TempSrc: Oral Oral  Oral  SpO2: 98% 99%  100%  Weight:   92.4 kg   Height:        General: Elderly male not in distress HEENT: Moist mucosa, supple neck Chest: Clear CVs: Normal S1-S2 GI: Soft, urostomy tube draining clear urine,  left flank appears clean, nontender Musculoskeletal: Warm, no edema   The results of significant diagnostics from this hospitalization (including imaging, microbiology,  ancillary and laboratory) are listed below for reference.     Microbiology: Recent Results (from the past 240 hour(s))  Culture, blood (Routine x 2)     Status: None   Collection Time: 03/16/19  6:13 PM   Specimen: BLOOD  Result Value Ref Range Status   Specimen Description   Final    BLOOD LEFT ANTECUBITAL Performed at [facility] [address], [place], [place] [address]    Special Requests   Final    BOTTLES DRAWN AEROBIC AND ANAEROBIC Blood Culture adequate volume Performed at [facility] [address], [place], [place] [address]    Culture   Final    NO GROWTH 5 DAYS Performed at [facility], [place] [address] [place] [address] [place], [place] [address]    Report Status 03/21/2019 FINAL  Final  Urine culture     Status: Abnormal  Collection Time: 03/16/19  8:37 PM   Specimen: In/Out Cath Urine  Result Value Ref Range Status   Specimen Description   Final    IN/OUT CATH URINE Performed at [facility], [place] [address] [place], [place] [address]    Special Requests   Final    NONE Performed at [facility], [place] [address] [place] [address] [place], [place] [address]    Culture (A)  Final    >=100,000 COLONIES/mL KLEBSIELLA PNEUMONIAE Confirmed Extended Spectrum Beta-Lactamase Producer (ESBL).  In bloodstream infections from ESBL organisms, carbapenems are preferred over piperacillin/tazobactam. They are shown to have a lower risk of mortality.    Report Status 03/18/2019 FINAL  Final   Organism ID, Bacteria KLEBSIELLA PNEUMONIAE (A)  Final      Susceptibility   Klebsiella pneumoniae - MIC*    AMPICILLIN >=32 RESISTANT Resistant     CEFAZOLIN >=64 RESISTANT Resistant     CEFTRIAXONE >=64 RESISTANT Resistant     CIPROFLOXACIN 2 INTERMEDIATE Intermediate     GENTAMICIN >=16 RESISTANT Resistant     IMIPENEM <=0.25 SENSITIVE Sensitive     NITROFURANTOIN 128 RESISTANT Resistant     TRIMETH/SULFA >=320 RESISTANT  Resistant     AMPICILLIN/SULBACTAM >=32 RESISTANT Resistant     PIP/TAZO 16 SENSITIVE Sensitive     Extended ESBL POSITIVE Resistant     * >=100,000 COLONIES/mL KLEBSIELLA PNEUMONIAE  SARS Coronavirus 2 by RT PCR (hospital order, performed in [place] hospital lab) Nasopharyngeal Nasopharyngeal Swab     Status: None   Collection Time: 03/16/19 10:11 PM   Specimen: Nasopharyngeal Swab  Result Value Ref Range Status   SARS Coronavirus 2 NEGATIVE NEGATIVE Final    Comment: (NOTE) If result is NEGATIVE SARS-CoV-2 target nucleic acids are NOT DETECTED. The SARS-CoV-2 RNA is generally detectable in upper and lower  respiratory specimens during the acute phase of infection. The lowest  concentration of SARS-CoV-2 viral copies this assay can detect is 250  copies / mL. A negative result does not preclude SARS-CoV-2 infection  and should not be used as the sole basis for treatment or other  patient management decisions.  A negative result may occur with  improper specimen collection / handling, submission of specimen other  than nasopharyngeal swab, presence of viral mutation(s) within the  areas targeted by this assay, and inadequate number of viral copies  (<250 copies / mL). A negative result must be combined with clinical  observations, patient history, and epidemiological information. If result is POSITIVE SARS-CoV-2 target nucleic acids are DETECTED. The SARS-CoV-2 RNA is generally detectable in upper and lower  respiratory specimens dur ing the acute phase of infection.  Positive  results are indicative of active infection with SARS-CoV-2.  Clinical  correlation with patient history and other diagnostic information is  necessary to determine patient infection status.  Positive results do  not rule out bacterial infection or co-infection with other viruses. If result is PRESUMPTIVE POSTIVE SARS-CoV-2 nucleic acids MAY BE PRESENT.   A presumptive positive result was obtained on the  submitted specimen  and confirmed on repeat testing.  While 2019 novel coronavirus  (SARS-CoV-2) nucleic acids may be present in the submitted sample  additional confirmatory testing may be necessary for epidemiological  and / or clinical management purposes  to differentiate between  SARS-CoV-2 and other Sarbecovirus currently known to infect humans.  If clinically indicated additional testing with an alternate test  methodology [phone]) is advised. The SARS-CoV-2 RNA is generally  detectable  in upper and lower respiratory sp ecimens during the acute  phase of infection. The expected result is Negative. Fact Sheet for Patients:  StrictlyIdeas.no Fact Sheet for Healthcare Providers: BankingDealers.co.za This test is not yet approved or cleared by the [place] FDA and has been authorized for detection and/or diagnosis of SARS-CoV-2 by FDA under an Emergency Use Authorization (EUA).  This EUA will remain in effect (meaning this test can be used) for the duration of the COVID-19 declaration under Section 564(b)(1) of the Act, 21 U.S.C. section 360bbb-3(b)(1), unless the authorization is terminated or revoked sooner. Performed at [facility], [place] [address], [place], [place] [address]      Labs: BNP (last 3 results) No results for input(s): BNP in the last 8760 hours. Basic Metabolic Panel: Recent Labs  Lab 03/16/19 1842 03/17/19 0057 03/17/19 0551 03/18/19 0554 03/19/19 0545 03/20/19 0525  NA 134*  --  137 139 136 134*  K 4.3  --  3.6 4.7 3.9 3.9  CL 100  --  109 109 106 103  CO2 22  --  21* 21* 21* 22  GLUCOSE 180*  --  94 89 96 90  BUN 30*  --  25* 25* 24* 22  CREATININE 1.51* 1.19 1.36* 1.20 1.27* 1.16  CALCIUM 9.4  --  7.7* 9.2 9.3 9.6   Liver Function Tests: Recent Labs  Lab 03/16/19 1842 03/17/19 0551  AST 26 17  ALT 13 8  ALKPHOS 73 52  BILITOT 1.1 0.5  PROT 8.2* 6.0*  ALBUMIN 3.7  2.6*   No results for input(s): LIPASE, AMYLASE in the last 168 hours. No results for input(s): AMMONIA in the last 168 hours. CBC: Recent Labs  Lab 03/16/19 1842 03/17/19 0057 03/17/19 0551 03/18/19 0554 03/19/19 0545  WBC 19.6* 18.1* 18.9* 18.3* 10.4  NEUTROABS 17.9*  --  16.2*  --   --   HGB 10.3* 8.5* 8.4* 8.9* 8.3*  HCT 32.4* 27.5* 26.6* 27.8* 27.3*  MCV 87.8 88.4 89.6 86.3 89.8  PLT 256 174 175 160 210   Cardiac Enzymes: No results for input(s): CKTOTAL, CKMB, CKMBINDEX, TROPONINI in the last 168 hours. BNP: Invalid input(s): POCBNP CBG: No results for input(s): GLUCAP in the last 168 hours. D-Dimer No results for input(s): DDIMER in the last 72 hours. Hgb A1c No results for input(s): HGBA1C in the last 72 hours. Lipid Profile No results for input(s): CHOL, HDL, LDLCALC, TRIG, CHOLHDL, LDLDIRECT in the last 72 hours. Thyroid function studies No results for input(s): TSH, T4TOTAL, T3FREE, THYROIDAB in the last 72 hours.  Invalid input(s): FREET3 Anemia work up No results for input(s): VITAMINB12, FOLATE, FERRITIN, TIBC, IRON, RETICCTPCT in the last 72 hours. Urinalysis    Component Value Date/Time   COLORURINE YELLOW 03/16/2019 1742   APPEARANCEUR CLOUDY (A) 03/16/2019 1742   APPEARANCEUR Cloudy (A) 01/27/2018 1625   LABSPEC 1.015 03/16/2019 1742   PHURINE 6.0 03/16/2019 1742   GLUCOSEU NEGATIVE 03/16/2019 1742   HGBUR MODERATE (A) 03/16/2019 1742   BILIRUBINUR NEGATIVE 03/16/2019 1742   BILIRUBINUR Negative 01/27/2018 1625   KETONESUR NEGATIVE 03/16/2019 1742   PROTEINUR 100 (A) 03/16/2019 1742   NITRITE POSITIVE (A) 03/16/2019 1742   LEUKOCYTESUR LARGE (A) 03/16/2019 1742   Sepsis Labs Invalid input(s): PROCALCITONIN,  WBC,  LACTICIDVEN Microbiology Recent Results (from the past 240 hour(s))  Culture, blood (Routine x 2)     Status: None   Collection Time: 03/16/19  6:13 PM   Specimen: BLOOD  Result  Value Ref Range Status   Specimen Description    Final    BLOOD LEFT ANTECUBITAL Performed at [facility] [address], [place], [place] [address]    Special Requests   Final    BOTTLES DRAWN AEROBIC AND ANAEROBIC Blood Culture adequate volume Performed at [facility] [address] [place], [place] [address]    Culture   Final    NO GROWTH 5 DAYS Performed at [facility], [place] [address] [place], [place] [address]    Report Status 03/21/2019 FINAL  Final  Urine culture     Status: Abnormal   Collection Time: 03/16/19  8:37 PM   Specimen: In/Out Cath Urine  Result Value Ref Range Status   Specimen Description   Final    IN/OUT CATH URINE Performed at [facility] [address], [place], [place] [address]    Special Requests   Final    NONE Performed at [facility], [place] [address] [place], [place] [address]    Culture (A)  Final    >=100,000 COLONIES/mL KLEBSIELLA PNEUMONIAE Confirmed Extended Spectrum Beta-Lactamase Producer (ESBL).  In bloodstream infections from ESBL organisms, carbapenems are preferred over piperacillin/tazobactam. They are shown to have a lower risk of mortality.    Report Status 03/18/2019 FINAL  Final   Organism ID, Bacteria KLEBSIELLA PNEUMONIAE (A)  Final      Susceptibility   Klebsiella pneumoniae - MIC*    AMPICILLIN >=32 RESISTANT Resistant     CEFAZOLIN >=64 RESISTANT Resistant     CEFTRIAXONE >=64 RESISTANT Resistant     CIPROFLOXACIN 2 INTERMEDIATE Intermediate     GENTAMICIN >=16 RESISTANT Resistant     IMIPENEM <=0.25 SENSITIVE Sensitive     NITROFURANTOIN 128 RESISTANT Resistant     TRIMETH/SULFA >=320 RESISTANT Resistant     AMPICILLIN/SULBACTAM >=32 RESISTANT Resistant     PIP/TAZO 16 SENSITIVE Sensitive     Extended ESBL POSITIVE Resistant     * >=100,000 COLONIES/mL KLEBSIELLA PNEUMONIAE  SARS Coronavirus 2 by RT PCR (hospital order, performed in [place] hospital lab) Nasopharyngeal  Nasopharyngeal Swab     Status: None   Collection Time: 03/16/19 10:11 PM   Specimen: Nasopharyngeal Swab  Result Value Ref Range Status   SARS Coronavirus 2 NEGATIVE NEGATIVE Final    Comment: (NOTE) If result is NEGATIVE SARS-CoV-2 target nucleic acids are NOT DETECTED. The SARS-CoV-2 RNA is generally detectable in upper and lower  respiratory specimens during the acute phase of infection. The lowest  concentration of SARS-CoV-2 viral copies this assay can detect is 250  copies / mL. A negative result does not preclude SARS-CoV-2 infection  and should not be used as the sole basis for treatment or other  patient management decisions.  A negative result may occur with  improper specimen collection / handling, submission of specimen other  than nasopharyngeal swab, presence of viral mutation(s) within the  areas targeted by this assay, and inadequate number of viral copies  (<250 copies / mL). A negative result must be combined with clinical  observations, patient history, and epidemiological information. If result is POSITIVE SARS-CoV-2 target nucleic acids are DETECTED. The SARS-CoV-2 RNA is generally detectable in upper and lower  respiratory specimens dur ing the acute phase of infection.  Positive  results are indicative of active infection with SARS-CoV-2.  Clinical  correlation with patient history and other diagnostic information is  necessary to determine patient infection status.  Positive  results do  not rule out bacterial infection or co-infection with other viruses. If result is PRESUMPTIVE POSTIVE SARS-CoV-2 nucleic acids MAY BE PRESENT.   A presumptive positive result was obtained on the submitted specimen  and confirmed on repeat testing.  While 2019 novel coronavirus  (SARS-CoV-2) nucleic acids may be present in the submitted sample  additional confirmatory testing may be necessary for epidemiological  and / or clinical management purposes  to differentiate between   SARS-CoV-2 and other Sarbecovirus currently known to infect humans.  If clinically indicated additional testing with an alternate test  methodology [phone]) is advised. The SARS-CoV-2 RNA is generally  detectable in upper and lower respiratory sp ecimens during the acute  phase of infection. The expected result is Negative. Fact Sheet for Patients:  StrictlyIdeas.no Fact Sheet for Healthcare Providers: BankingDealers.co.za This test is not yet approved or cleared by the [place] FDA and has been authorized for detection and/or diagnosis of SARS-CoV-2 by FDA under an Emergency Use Authorization (EUA).  This EUA will remain in effect (meaning this test can be used) for the duration of the COVID-19 declaration under Section 564(b)(1) of the Act, 21 U.S.C. section 360bbb-3(b)(1), unless the authorization is terminated or revoked sooner. Performed at [facility], [place] [address], [place], [place] [address]      Time coordinating discharge: 35 minutes  SIGNED:   Louellen Molder, MD  [facility] 03/21/2019, 11:40 AM Pager   If 7PM-7AM, please contact night-coverage [email or web address] Password [email or web address]

## 2019-03-21 NOTE — Progress Notes (Signed)
PHARMACY CONSULT NOTE FOR:  OUTPATIENT  PARENTERAL ANTIBIOTIC THERAPY (OPAT)  Indication: ESBL UTI with sepsis Regimen: Ertapenem 1gm IV q24h End date: 03/23/2019  IV antibiotic discharge orders are pended. To discharging provider:  please sign these orders via discharge navigator,  Select New Orders & click on the button choice - Manage This Unsigned Work.     Thank you for allowing pharmacy to be a part of this patient's care.  Doreene Eland, PharmD, BCPS.   Work Cell: [phone] 03/21/2019 10:57 AM

## 2019-03-21 NOTE — Progress Notes (Signed)
Peripherally Inserted Central Catheter/Midline Placement  The IV Nurse has discussed with the patient and/or persons authorized to consent for the patient, the purpose of this procedure and the potential benefits and risks involved with this procedure.  The benefits include less needle sticks, lab draws from the catheter, and the patient may be discharged home with the catheter. Risks include, but not limited to, infection, bleeding, blood clot (thrombus formation), and puncture of an artery; nerve damage and irregular heartbeat and possibility to perform a PICC exchange if needed/ordered by physician.  Alternatives to this procedure were also discussed.  Bard Power PICC patient education guide, fact sheet on infection prevention and patient information card has been provided to patient /or left at bedside.    PICC/Midline Placement Documentation  PICC Single Lumen [id number] PICC Right Basilic 40 cm 0 cm (Active)  Indication for Insertion or Continuance of Line Home intravenous therapies (PICC only) 03/21/19 1405  Exposed Catheter (cm) 0 cm 03/21/19 1405  Site Assessment Clean;Dry;Intact 03/21/19 1405  Line Status Blood return noted;Flushed;Saline locked 03/21/19 1405  Dressing Type Transparent;Securing device 03/21/19 1405  Dressing Status Clean;Dry;Intact;Antimicrobial disc in place 03/21/19 1405  Dressing Change Due 03/28/19 03/21/19 1405       Frances Maywood 03/21/2019, 2:09 PM

## 2019-03-21 NOTE — Discharge Instructions (Signed)
Pyelonephritis, Adult ° °Pyelonephritis is an infection that occurs in the kidney. The kidneys are organs that help clean the blood by moving waste out of the blood and into the pee (urine). This infection can happen quickly, or it can last for a long time. In most cases, it clears up with treatment and does not cause other problems. °What are the causes? °This condition may be caused by: °· Germs (bacteria) going from the bladder up to the kidney. This may happen after having a bladder infection. °· Germs going from the blood to the kidney. °What increases the risk? °This condition is more likely to develop in: °· Pregnant women. °· Older people. °· People who have any of these conditions: °? Diabetes. °? Inflammation of the prostate gland (prostatitis), in males. °? Kidney stones or bladder stones. °? Other problems with the kidney or the parts of your body that carry pee from the kidneys to the bladder (ureters). °? Cancer. °· People who have a small, thin tube (catheter) placed in the bladder. °· People who are sexually active. °· Women who use a medicine that kills sperm (spermicide) to prevent pregnancy. °· People who have had a prior urinary tract infection (UTI). °What are the signs or symptoms? °Symptoms of this condition include: °· Peeing often. °· A strong urge to pee right away. °· Burning or stinging when peeing. °· Belly pain. °· Back pain. °· Pain in the side (flank area). °· Fever or chills. °· Blood in the pee, or dark pee. °· Feeling sick to your stomach (nauseous) or throwing up (vomiting). °How is this treated? °This condition may be treated by: °· Taking antibiotic medicines by mouth (orally). °· Drinking enough fluids. °If the infection is bad, you may need to stay in the hospital. You may be given antibiotics and fluids that are put directly into a vein through an IV tube. °In some cases, other treatments may be needed. °Follow these instructions at home: °Medicines °· Take your antibiotic  medicine as told by your doctor. Do not stop taking the antibiotic even if you start to feel better. °· Take over-the-counter and prescription medicines only as told by your doctor. °General instructions ° °· Drink enough fluid to keep your pee pale yellow. °· Avoid caffeine, tea, and carbonated drinks. °· Pee (urinate) often. Avoid holding in pee for long periods of time. °· Pee before and after sex. °· After pooping (having a bowel movement), women should wipe from front to back. Use each tissue only once. °· Keep all follow-up visits as told by your doctor. This is important. °Contact a doctor if: °· You do not feel better after 2 days. °· Your symptoms get worse. °· You have a fever. °Get help right away if: °· You cannot take your medicine or drink fluids as told. °· You have chills and shaking. °· You throw up. °· You have very bad pain in your side or back. °· You feel very weak or you pass out (faint). °Summary °· Pyelonephritis is an infection that occurs in the kidney. °· In most cases, this infection clears up with treatment and does not cause other problems. °· Take your antibiotic medicine as told by your doctor. Do not stop taking the antibiotic even if you start to feel better. °· Drink enough fluid to keep your pee pale yellow. °This information is not intended to replace advice given to you by your health care provider. Make sure you discuss any questions you have with   your health care provider. °Document Released: 06/25/2004 Document Revised: 03/22/2018 Document Reviewed: 03/22/2018 °Elsevier Patient Education © 2020 [employer] ° °

## 2019-03-21 NOTE — TOC Transition Note (Signed)
Transition of Care [facility]) - CM/SW Discharge Note   Patient Details  Name: Billy Rasmussen MRN: [id number] Date of Birth: 1945/06/23  Transition of [facility]) CM/SW Contact:  Nila Nephew, LCSW Phone Number: [phone] 03/21/2019, 10:13 AM   Clinical Narrative:   Pt preparing to discharge home to continue course of IV antibiotics- has used [facility] and [facility] in the past and requests agencies again, representatives aware and following.  Needs HH PT/RN orders at DC.    Final next level of care: [place] Barriers to Discharge: No Barriers Identified   Patient Goals and CMS Choice Patient states their goals for this hospitalization and ongoing recovery are:: resume home health CMS Medicare.gov Compare Post Acute Care list provided to:: Patient Choice offered to / list presented to : Patient   Discharge Plan and Services  HH Arranged: RN, PT, IV infusion HH Agency: [facility] Date [facility] Agency Contacted: 03/21/19 Time [place]: [id number] Representative spoke with at [facility]: Jefm Petty  Social Determinants of Health ([place]) Interventions     Readmission Risk Interventions No flowsheet data found.

## 2019-03-22 DIAGNOSIS — Z452 Encounter for adjustment and management of vascular access device: Secondary | ICD-10-CM | POA: Diagnosis not present

## 2019-03-22 DIAGNOSIS — M19022 Primary osteoarthritis, left elbow: Secondary | ICD-10-CM | POA: Diagnosis not present

## 2019-03-22 DIAGNOSIS — K5730 Diverticulosis of large intestine without perforation or abscess without bleeding: Secondary | ICD-10-CM | POA: Diagnosis not present

## 2019-03-22 DIAGNOSIS — Z791 Long term (current) use of non-steroidal anti-inflammatories (NSAID): Secondary | ICD-10-CM | POA: Diagnosis not present

## 2019-03-22 DIAGNOSIS — M19021 Primary osteoarthritis, right elbow: Secondary | ICD-10-CM | POA: Diagnosis not present

## 2019-03-22 DIAGNOSIS — E7800 Pure hypercholesterolemia, unspecified: Secondary | ICD-10-CM | POA: Diagnosis not present

## 2019-03-22 DIAGNOSIS — I119 Hypertensive heart disease without heart failure: Secondary | ICD-10-CM | POA: Diagnosis not present

## 2019-03-22 DIAGNOSIS — Z9981 Dependence on supplemental oxygen: Secondary | ICD-10-CM | POA: Diagnosis not present

## 2019-03-22 DIAGNOSIS — N179 Acute kidney failure, unspecified: Secondary | ICD-10-CM | POA: Diagnosis not present

## 2019-03-22 DIAGNOSIS — D630 Anemia in neoplastic disease: Secondary | ICD-10-CM | POA: Diagnosis not present

## 2019-03-22 DIAGNOSIS — K8020 Calculus of gallbladder without cholecystitis without obstruction: Secondary | ICD-10-CM | POA: Diagnosis not present

## 2019-03-22 DIAGNOSIS — E669 Obesity, unspecified: Secondary | ICD-10-CM | POA: Diagnosis not present

## 2019-03-22 DIAGNOSIS — M47816 Spondylosis without myelopathy or radiculopathy, lumbar region: Secondary | ICD-10-CM | POA: Diagnosis not present

## 2019-03-22 DIAGNOSIS — Z1612 Extended spectrum beta lactamase (ESBL) resistance: Secondary | ICD-10-CM | POA: Diagnosis not present

## 2019-03-22 DIAGNOSIS — K219 Gastro-esophageal reflux disease without esophagitis: Secondary | ICD-10-CM | POA: Diagnosis not present

## 2019-03-22 DIAGNOSIS — Z96612 Presence of left artificial shoulder joint: Secondary | ICD-10-CM | POA: Diagnosis not present

## 2019-03-22 DIAGNOSIS — Z792 Long term (current) use of antibiotics: Secondary | ICD-10-CM | POA: Diagnosis not present

## 2019-03-22 DIAGNOSIS — N10 Acute tubulo-interstitial nephritis: Secondary | ICD-10-CM | POA: Diagnosis not present

## 2019-03-22 DIAGNOSIS — B961 Klebsiella pneumoniae [K. pneumoniae] as the cause of diseases classified elsewhere: Secondary | ICD-10-CM | POA: Diagnosis not present

## 2019-03-22 DIAGNOSIS — N136 Pyonephrosis: Secondary | ICD-10-CM | POA: Diagnosis not present

## 2019-03-22 DIAGNOSIS — Z96611 Presence of right artificial shoulder joint: Secondary | ICD-10-CM | POA: Diagnosis not present

## 2019-03-22 DIAGNOSIS — C679 Malignant neoplasm of bladder, unspecified: Secondary | ICD-10-CM | POA: Diagnosis not present

## 2019-03-22 DIAGNOSIS — Z79891 Long term (current) use of opiate analgesic: Secondary | ICD-10-CM | POA: Diagnosis not present

## 2019-03-22 DIAGNOSIS — Z436 Encounter for attention to other artificial openings of urinary tract: Secondary | ICD-10-CM | POA: Diagnosis not present

## 2019-03-22 DIAGNOSIS — I700 Atherosclerosis of aorta: Secondary | ICD-10-CM | POA: Diagnosis not present

## 2019-03-23 ENCOUNTER — Telehealth: Payer: Self-pay

## 2019-03-23 NOTE — Telephone Encounter (Signed)
Received a call today from [place] with [facility] requesting order to pull picc. RN states that PCP would not give order since he is not managing antibiotic orders. Patient finished antibiotics today, and would like picc removed as soon as possible. Will forward message to MD to advise on orders. Sonia Baller, [phone]. [place]

## 2019-03-28 DIAGNOSIS — C678 Malignant neoplasm of overlapping sites of bladder: Secondary | ICD-10-CM | POA: Diagnosis not present

## 2019-03-28 DIAGNOSIS — N10 Acute tubulo-interstitial nephritis: Secondary | ICD-10-CM | POA: Diagnosis not present

## 2019-03-28 DIAGNOSIS — N130 Hydronephrosis with ureteropelvic junction obstruction: Secondary | ICD-10-CM | POA: Diagnosis not present

## 2019-03-31 ENCOUNTER — Ambulatory Visit (INDEPENDENT_AMBULATORY_CARE_PROVIDER_SITE_OTHER): Payer: Medicare Other | Admitting: Cardiovascular Disease

## 2019-03-31 ENCOUNTER — Encounter: Payer: Self-pay | Admitting: Cardiovascular Disease

## 2019-03-31 ENCOUNTER — Other Ambulatory Visit: Payer: Self-pay

## 2019-03-31 VITALS — BP 112/70 | HR 84 | Temp 97.5°F | Ht 66.0 in | Wt 207.0 lb

## 2019-03-31 DIAGNOSIS — Z79899 Other long term (current) drug therapy: Secondary | ICD-10-CM

## 2019-03-31 DIAGNOSIS — I10 Essential (primary) hypertension: Secondary | ICD-10-CM | POA: Diagnosis not present

## 2019-03-31 DIAGNOSIS — Z8249 Family history of ischemic heart disease and other diseases of the circulatory system: Secondary | ICD-10-CM

## 2019-03-31 DIAGNOSIS — A419 Sepsis, unspecified organism: Secondary | ICD-10-CM | POA: Diagnosis not present

## 2019-03-31 DIAGNOSIS — E782 Mixed hyperlipidemia: Secondary | ICD-10-CM | POA: Diagnosis not present

## 2019-03-31 DIAGNOSIS — R5383 Other fatigue: Secondary | ICD-10-CM | POA: Diagnosis not present

## 2019-03-31 DIAGNOSIS — N390 Urinary tract infection, site not specified: Secondary | ICD-10-CM

## 2019-03-31 DIAGNOSIS — G4733 Obstructive sleep apnea (adult) (pediatric): Secondary | ICD-10-CM | POA: Diagnosis not present

## 2019-03-31 NOTE — Progress Notes (Signed)
Cardiology Office Note    Date:  04/06/2019   ID:  Rohail, Klees [birth year], MRN [id number]  PCP:  Terald Sleeper, PA-C  Cardiologist:  Shelva Majestic, MD   Reestablishment of cardiology care  History of Present Illness:  Billy Rasmussen is a 73 y.o. male who I had initially seen in 2008 per referral of Dr. Octavio Graves.  The patient had a history of hypertension, premature family history for CAD, obstructive sleep apnea, as well as mixed hyperlipidemia.  Remote lipoma had analysis had shown significantly increased LDL particle concentration at 1765 of which essentially all were small dense LDL subtypes.  He had undergone a stress Myoview study in March 2008 which showed normal perfusion.  I have not seen him since 2008.  Presently, he has had issues with recurrent bladder cancer for which she initially underwent radical cystoprostatectomy with ileal conduit in June 2020 and subsequently required rehospitalizations in August and September for sepsis with pyelonephritis requiring left ureteral stent for hydronephrosis.  He was last admitted to the hospital with fevers chills and flank pain on October 15 and was discharged on October 20.  He had issues with blood pressure in the past with hypertension and during his sepsis with low blood pressure.  He states he had a sleep study over 12 years ago.  He has not been on CPAP but has been sleeping with 2-1/2 L of supplemental oxygen nocturnally.  Most recently, he has been on diclofenac 75 mg twice a day for arthritis, lisinopril HCT 20/12.5 mg for hypertension.  He is no longer on lipid-lowering therapy.  He has not been using CPAP.  In his recent hospitalizations, he was told of having cardiac irregularities and repeat cardiology evaluation was advised.  He presents to reestablish cardiology care.   Past Medical History:  Diagnosis Date   Arthritis    knees, shoulders, elbows   Bladder cancer [facility])    urologist-  dr eskridge    Diverticulosis of colon    Fibromyalgia    GERD (gastroesophageal reflux disease)    History of diverticulitis of colon    History of gastric ulcer    due to aleve   Hypertension    Lower urinary tract symptoms (LUTS)    OSA (obstructive sleep apnea)    NON-COMPLIANT  CPAP  --- BUT PT USES OXYGEN AT NIGHT 2.5L VIA [place] (PT'S DECISION)   PONV (postoperative nausea and vomiting)    Psoriasis    Tinnitus    right ear more, has tranmitter in right ear removable at hs   Wears glasses    Wears partial dentures     Past Surgical History:  Procedure Laterality Date   APPENDECTOMY     COLECTOMY W/ COLOSTOMY  1996   W/   APPENDECTOMY   COLONOSCOPY N/A 05/11/2018   Procedure: COLONOSCOPY;  Surgeon: Daneil Dolin, MD;  [facility];  Service: Endoscopy;  Laterality: N/A;  9:30   COLOSTOMY TAKEDOWN  1996   CYSTOSCOPY WITH BIOPSY N/A 12/05/2013   Procedure: CYSTO BLADDER BIOPSY AND FULGERATION;  Surgeon: Festus Aloe, MD;  Location: [facility];  Service: Urology;  Laterality: N/A;   CYSTOSCOPY WITH BIOPSY Bilateral 11/13/2014   Procedure: CYSTOSCOPY WITH  BLADDER BIOPSY FULGERATION AND BILATERAL RETROGRADE PYELOGRAMS;  Surgeon: Festus Aloe, MD;  Location: [facility];  Service: Urology;  Laterality: Bilateral;   CYSTOSCOPY WITH FULGERATION N/A 01/18/2018   Procedure: CYSTOSCOPY WITH FULGERATION/ BLADDER BIOPSY;  Surgeon: Festus Aloe, MD;  Location[facility];  Service: Urology;  Laterality: N/A;   CYSTOSCOPY WITH INJECTION N/A 11/09/2018   Procedure: CYSTOSCOPY WITH INJECTION OF INDOCYANINE GREEN DYE;  Surgeon: Alexis Frock, MD;  [facility];  Service: Urology;  Laterality: N/A;   CYSTOSCOPY WITH INSERTION OF UROLIFT N/A 01/18/2018   Procedure: CYSTOSCOPY WITH INSERTION OF UROLIFT;  Surgeon: Festus Aloe, MD;  Location: [facility];  Service: Urology;  Laterality: N/A;    CYSTOSCOPY/URETEROSCOPY/HOLMIUM LASER Left 02/17/2019   Procedure: URETEROSCOPY WITH BALLOON DILATION  AND NEPHROSTOGRAM;  Surgeon: Alexis Frock, MD;  Location: [facility];  Service: Urology;  Laterality: Left;  1 HR   EXCISION RIGHT UPPER ARM LIPOMA  2005   HEMORROIDECTOMY     INGUINAL HERNIA REPAIR Left 1984   IR NEPHROSTOMY PLACEMENT LEFT  01/05/2019   KNEE ARTHROSCOPY Left X3  LAST ONE  2002   ORIF LEFT HUMEROUS FX  1976   POLYPECTOMY  05/11/2018   Procedure: POLYPECTOMY;  Surgeon: Daneil Dolin, MD;  [facility];  Service: Endoscopy;;   PROSTATE SURGERY     TONSILLECTOMY  AS CHILD   TOTAL KNEE ARTHROPLASTY Left 2006   REVISION 2007  (AFTER I & D WITH ANTIBIOTIC SPACER PROCEDURE FOR STEPH INFECTION)   TOTAL KNEE ARTHROPLASTY Right 03/30/2016   Procedure: RIGHT TOTAL KNEE ARTHROPLASTY;  Surgeon: Paralee Cancel, MD;  [facility];  Service: Orthopedics;  Laterality: Right;   TOTAL SHOULDER ARTHROPLASTY Right 04/06/2013   Procedure: RIGHT TOTAL SHOULDER ARTHROPLASTY;  Surgeon: Marin Shutter, MD;  Location: [place];  Service: Orthopedics;  Laterality: Right;   TOTAL SHOULDER ARTHROPLASTY Left 07/20/2013   Procedure: LEFT TOTAL SHOULDER ARTHROPLASTY;  Surgeon: Marin Shutter, MD;  Location: [place];  Service: Orthopedics;  Laterality: Left;   TRANSURETHRAL RESECTION OF BLADDER TUMOR N/A 11/12/2015   Procedure: TRANSURETHRAL RESECTION OF BLADDER TUMOR (TURBT);  Surgeon: Festus Aloe, MD;  Location: [facility];  Service: Urology;  Laterality: N/A;   TRANSURETHRAL RESECTION OF BLADDER TUMOR WITH GYRUS (TURBT-GYRUS) N/A 12/05/2013   Procedure: TRANSURETHRAL RESECTION OF BLADDER TUMOR WITH GYRUS (TURBT-GYRUS);  Surgeon: Festus Aloe, MD;  Location: [facility];  Service: Urology;  Laterality: N/A;    Current Medications: Outpatient Medications Prior to Visit  Medication Sig Dispense Refill   diclofenac (VOLTAREN) 75  MG EC tablet TAKE 1 TABLET BY MOUTH TWICE A DAY (Patient taking differently: Take 75 mg by mouth 2 (two) times daily. ) 180 tablet 0   docusate sodium (COLACE) 100 MG capsule Take 200 mg by mouth daily.     feeding supplement, ENSURE ENLIVE, (ENSURE ENLIVE) LIQD Take 237 mLs by mouth 2 (two) times daily between meals. 237 mL 12   lisinopril-hydrochlorothiazide (ZESTORETIC) 20-12.5 MG tablet TAKE 1 TABLET BY MOUTH EVERY DAY (Patient taking differently: Take 1 tablet by mouth daily. ) 90 tablet 1   meclizine (ANTIVERT) 25 MG tablet Take 25 mg by mouth 3 (three) times daily as needed for dizziness.     Multiple Vitamin (MULTIVITAMIN WITH MINERALS) TABS tablet Take 1 tablet by mouth daily.     Polyethyl Glycol-Propyl Glycol (SYSTANE ULTRA OP) Place 1 drop into both eyes daily as needed (dry eyes).     Calcium Carb-Cholecalciferol (CALCIUM 600 + D PO) Take 1 tablet by mouth daily.     Powders (ANTI MONKEY BUTT EX) Apply 1 application topically daily as needed (irritation).     Zinc  50 MG CAPS Take 50 mg by mouth daily.     No facility-administered medications prior to visit.      Allergies:   Patient has no known allergies.   Social History   Socioeconomic History   Marital status: Married    Spouse name: Peggy   Number of children: 2   Years of education: Not on file   Highest education level: Associate degree: occupational, Hotel manager, or vocational program  Occupational History   Occupation: retired    Comment: Personal assistant, Haematologist strain: Not hard at all   Food insecurity    Worry: Patient refused    Inability: Patient refused   Transportation needs    Medical: Patient refused    Non-medical: Patient refused  Tobacco Use   Smoking status: Former Smoker    Packs/day: 2.00    Years: 40.00    Pack years: 80.00    Types: Cigarettes    Quit date: 06/01/1997    Years since quitting: 21.8   Smokeless tobacco: Never Used    Substance and Sexual Activity   Alcohol use: No   Drug use: No   Sexual activity: Not Currently  Lifestyle   Physical activity    Days per week: Patient refused    Minutes per session: Patient refused   Stress: Not on file  Relationships   Social connections    Talks on phone: Patient refused    Gets together: Patient refused    Attends religious service: Patient refused    Active member of club or organization: Patient refused    Attends meetings of clubs or organizations: Patient refused    Relationship status: Patient refused  Other Topics Concern   Not on file  Social History Narrative   Not on file     Family History:  The patient's family history includes Alzheimer's disease in his mother; Congestive Heart Failure in his brother; Diabetes in his daughter; Heart attack in his father; Heart disease in his father; Irregular heart beat in his brother.   ROS General: Negative; No fevers, chills, or night sweats;  HEENT: Negative; No changes in vision or hearing, sinus congestion, difficulty swallowing Pulmonary: Negative; No cough, wheezing, shortness of breath, hemoptysis Cardiovascular: Negative; No chest pain, presyncope, syncope, palpitations GI: Negative; No nausea, vomiting, diarrhea, or abdominal pain GU: Current bladder cancer status post radical cystoprostatectomy with ileal conduit with recurrent episodes of sepsis and pyelonephritis Musculoskeletal: Negative; no myalgias, joint pain, or weakness Hematologic/Oncology: Negative; no easy bruising, bleeding Endocrine: Negative; no heat/cold intolerance; no diabetes Neuro: Negative; no changes in balance, headaches Skin: Negative; No rashes or skin lesions Psychiatric: Negative; No behavioral problems, depression Sleep: Previous diagnosis of obstructive sleep apnea.  Not on CPAP therapy but using 2-1/2 L of oxygen supplement at nighttime  \Other comprehensive 14 point system review is negative.   PHYSICAL  EXAM:   VS:  BP 112/70    Pulse 84    Temp (!) 97.5 F (36.4 C)    Ht _0  (1.676 m)    Wt 207 lb (93.9 kg)    SpO2 99%    BMI 33.41 kg/m     Repeat blood pressure by me 118/70  Wt Readings from Last 3 Encounters:  03/31/19 207 lb (93.9 kg)  03/21/19 203 lb 12.8 oz (92.4 kg)  02/17/19 202 lb 9 oz (91.9 kg)    General: Alert, oriented, no distress.  Skin: normal turgor, no rashes, warm  and dry HEENT: Normocephalic, atraumatic. Pupils equal round and reactive to light; sclera anicteric; extraocular muscles intact;  Nose without nasal septal hypertrophy Mouth/Parynx benign; Mallinpatti scale 3 Neck: No JVD, no carotid bruits; normal carotid upstroke Lungs: clear to ausculatation and percussion; no wheezing or rales Chest wall: without tenderness to palpitation Heart: PMI not displaced, RRR, s1 s2 normal, 1/6 systolic murmur, no diastolic murmur, no rubs, gallops, thrills, or heaves Abdomen: Urinary bag from ileal conduit in the right lower quadrant;soft, nontender; no hepatosplenomehaly, BS+; abdominal aorta nontender and not dilated by palpation. Back: no CVA tenderness Pulses 2+ Musculoskeletal: full range of motion, normal strength, no joint deformities Extremities: no clubbing cyanosis or edema, Homan's sign negative  Neurologic: grossly nonfocal; Cranial nerves grossly wnl Psychologic: Normal mood and affect   Studies/Labs Reviewed:   EKG:  EKG is ordered today.  ECG (independently read by me): Normal sinus rhythm at 83 bpm.  Left axis deviation.  Poor R wave progression anteriorly  Recent Labs: BMP Latest Ref Rng & Units 03/20/2019 03/19/2019 03/18/2019  Glucose 70 - 99 mg/dL 90 96 89  BUN 8 - 23 mg/dL 22 24(H) 25(H)  Creatinine 0.61 - 1.24 mg/dL 1.16 1.27(H) 1.20  BUN/Creat Ratio 10 - 24 - - -  Sodium 135 - 145 mmol/L 134(L) 136 139  Potassium 3.5 - 5.1 mmol/L 3.9 3.9 4.7  Chloride 98 - 111 mmol/L 103 106 109  CO2 22 - 32 mmol/L 22 21(L) 21(L)  Calcium 8.9 - 10.3  mg/dL 9.6 9.3 9.2     Hepatic Function Latest Ref Rng & Units 03/17/2019 03/16/2019 01/02/2019  Total Protein 6.5 - 8.1 g/dL 6.0(L) 8.2(H) 8.4(H)  Albumin 3.5 - 5.0 g/dL 2.6(L) 3.7 4.1  AST 15 - 41 U/L 17 26 34  ALT 0 - 44 U/L _0 Alk Phosphatase 38 - 126 U/L 52 73 72  Total Bilirubin 0.3 - 1.2 mg/dL 0.5 1.1 1.1    CBC Latest Ref Rng & Units 03/19/2019 03/18/2019 03/17/2019  WBC 4.0 - 10.5 K/uL 10.4 18.3(H) 18.9(H)  Hemoglobin 13.0 - 17.0 g/dL 8.3(L) 8.9(L) 8.4(L)  Hematocrit 39.0 - 52.0 % 27.3(L) 27.8(L) 26.6(L)  Platelets 150 - 400 K/uL 210 160 175   Lab Results  Component Value Date   MCV 89.8 03/19/2019   MCV 86.3 03/18/2019   MCV 89.6 03/17/2019   No results found for: TSH No results found for: HGBA1C   BNP No results found for: BNP  ProBNP No results found for: PROBNP   Lipid Panel     Component Value Date/Time   CHOL 142 12/30/2018 1133   TRIG 167 (H) 12/30/2018 1133   HDL 23 (L) 12/30/2018 1133   CHOLHDL 6.2 (H) 12/30/2018 1133   LDLCALC 86 12/30/2018 1133   LDLDIRECT 96 02/25/2016 1352   LABVLDL 33 12/30/2018 1133     RADIOLOGY: Dg Chest 2 View  Result Date: 03/16/2019 CLINICAL DATA:  Weakness and fevers, sepsis EXAM: CHEST - 2 VIEW COMPARISON:  01/06/2019 FINDINGS: The heart size and mediastinal contours are within normal limits. Both lungs are clear. The visualized skeletal structures are unremarkable. Minimal left basilar scarring versus atelectasis. Trachea midline. Bilateral shoulder replacements noted. IMPRESSION: No acute chest process. Electronically Signed   By: Jerilynn Mages.  Shick M.D.   On: 03/16/2019 19:12   Ct Abdomen Pelvis W Contrast  Result Date: 03/16/2019 CLINICAL DATA:  73 year old male with fever of unknown origin. Renal stent placed 3 weeks ago. History of bladder cancer. EXAM: CT  ABDOMEN AND PELVIS WITH CONTRAST TECHNIQUE: Multidetector CT imaging of the abdomen and pelvis was performed using the standard protocol following bolus  administration of intravenous contrast. CONTRAST:  151m OMNIPAQUE IOHEXOL 300 MG/ML  SOLN COMPARISON:  CT Abdomen and Pelvis 01/02/2019. FINDINGS: Lower chest: Stable mild cardiomegaly. Negative lung bases, no effusion. Hepatobiliary: Chronic cholelithiasis with numerous gallstones. No pericholecystic inflammation. Negative liver. No bile duct enlargement. Pancreas: Negative. Spleen: Negative. Adrenals/Urinary Tract: Normal adrenal glands. Negative right kidney and proximal right ureter. Continued left renal inflammation, although partially regressed stranding of the kidney and left pararenal fat since 01/02/2019. Continued abnormal enhancement of the left renal cortex, and on delayed images no left renal contrast excretion is identified. Superimposed left nephroureteral stent has been placed. The distal aspect of the stent is discussed below. No adverse features are identified at the left kidney or the skin site. Surgically absent bladder. Stomach/Bowel: Diverticulosis from the sigmoid colon into the descending colon. Large bowel retained stool and redundancy. No large bowel inflammation. Prior appendectomy (coronal image 67. Negative terminal ileum. Just upstream of the TI there is a small bowel anastomosis with no adverse features identified. There are no dilated loops of small bowel. An ileal conduit exits the skin in the right abdomen on series 2, image 49. A short distance upstream of the ileostomy loop (series 2, image 54) a ureteral stent terminates. This is an internal-external stent, which traverses the kidney as additional detailed above. Negative stomach.  No free air.  No free fluid. Vascular/Lymphatic: Major arterial structures are patent. Mild Aortoiliac calcified atherosclerosis. Portal venous system appears to be patent. No lymphadenopathy. Reproductive: Stable appearance of prior cystoprostatectomy. Small fat containing left inguinal hernia. Other: No pelvic free fluid. Musculoskeletal:  Degenerative changes in the spine. No acute or suspicious osseous lesion. IMPRESSION: 1. Continued inflammation of the left kidney compatible with pyelonephritis, although the degree of left renal and pararenal stranding is perhaps mildly improved since August. A left nephroureteral stent has been placed. The distal aspect of the stent travels within the ileal conduit and terminates a several centimeters upstream of ileostomy, with no adverse features. 2. Right kidney remains normal. Stable appearance of cystoprostatectomy. 3. No other acute or inflammatory process. Cholelithiasis. Large bowel diverticulosis and retained stool. Electronically Signed   By: HGenevie AnnM.D.   On: 03/16/2019 22:22   UKorea[place]kg Site Rite  Result Date: 03/20/2019 If Site Rite image not attached, placement could not be confirmed due to current cardiac rhythm.    Additional studies/ records that were reviewed today include:  [facility].  I reviewed his recent hospitalizations.   ASSESSMENT:    1. Essential hypertension   2. Sepsis due to urinary tract infection ([place]   3. Mixed hyperlipidemia   4. Fatigue, unspecified type   5. Family history of heart disease   6. Medication management   7. OSA (obstructive sleep apnea)     PLAN:  Mr. CTahmid Stonehockeris a 73year old gentleman who has a history of hypertension, premature family history for CAD, mixed hyperlipidemia, as well as obstructive sleep apnea.  A nuclear perfusion study in March 2008 showed normal perfusion.  He has had issues with recurrent bladder cancer and has undergone radical cystoprostatectomy with ileal conduit and has had multiple subsequent hospitalizations for sepsis with pyelonephrosis requiring left ureteral stent.  Apparently he was told of having some cardiac irregularity during one of his hospitalizations.  Presently, he denies any chest pain.  He does not use CPAP  therapy but uses 2-1/2 L of supplemental oxygen nocturnally for sleep.  He admits to decreased energy.  He admits to a greater than 10-year history of previous hypertension and when seen by me in 2008 he was on lisinopril HCT 20/12.5 mg in addition to simvastatin/niacin, omega-3 fatty acid and aspirin.  His blood pressure today is stable.  He is not having any anginal symptoms.  I am schedule him to undergo an echo Doppler study to reassess systolic and diastolic function as well as valvular architecture.  I am also checking fasting laboratory with a comprehensive metabolic panel, CBC, TSH as well as fasting lipid studies.  He uses nocturnal oxygen at night and no longer uses CPAP.  He believes he is sleeping adequately with supplemental oxygen.  He now sees Particia Nearing, PA-C for primary care at [facility].  I will see him in 6 weeks for follow-up evaluation   Medication Adjustments/Labs and Tests Ordered: Current medicines are reviewed at length with the patient today.  Concerns regarding medicines are outlined above.  Medication changes, Labs and Tests ordered today are listed in the Patient Instructions below. Patient Instructions  Medication Instructions:  The current medical regimen is effective;  continue present plan and medications as directed. Please refer to the Current Medication list given to you today. If you need a refill on your cardiac medications before your next appointment, please call your pharmacy.  Labwork: FASTING LIPID PANEL, CMET,CBC AND TSH HERE IN OUR OFFICE AT [facility]   You will need to fast. DO NOT EAT OR DRINK PAST MIDNIGHT.      If you have labs (blood work) drawn today and your tests are completely normal, you will receive your results only by:  [place] (if you have MyChart) OR  A paper copy in the mail If you have any lab test that is abnormal or we need to change your treatment, we will call you to review the  results.  Testing/Procedures: Echocardiogram - Your physician has requested that you have an echocardiogram. Echocardiography is a painless test that uses sound waves to create images of your heart. It provides your doctor with information about the size and shape of your heart and how well your hearts chambers and valves are working. This procedure takes approximately one hour. There are no restrictions for this procedure. This will be performed at our [facility] location - [address], [address].  Follow-Up: IN 6 WEEKS In Person You may see Shelva Majestic, MD or one of the following Advanced Practice Providers on your designated Care Team:  Almyra Deforest, PA-C Fabian Sharp, PA-C or [place], [place].    At [facility], you and your health needs are our priority.  As part of our continuing mission to provide you with exceptional heart care, we have created designated Provider Care Teams.  These Care Teams include your primary Cardiologist (physician) and Advanced Practice Providers (APPs -  Physician Assistants and Nurse Practitioners) who all work together to provide you with the care you need, when you need it.  Thank you for choosing [facility] at [facility]!!          Signed, Shelva Majestic, MD  04/06/2019 12:24 PM    [facility] [address], [place], [place], [place]  [address] Phone: [phone]

## 2019-03-31 NOTE — Patient Instructions (Signed)
Medication Instructions:  The current medical regimen is effective;  continue present plan and medications as directed. Please refer to the Current Medication list given to you today. If you need a refill on your cardiac medications before your next appointment, please call your pharmacy.  Labwork: FASTING LIPID PANEL, CMET,CBC AND TSH HERE IN OUR OFFICE AT [facility]   You will need to fast. DO NOT EAT OR DRINK PAST MIDNIGHT.      If you have labs (blood work) drawn today and your tests are completely normal, you will receive your results only by: [place] Kitchen MyChart Message (if you have MyChart) OR . A paper copy in the mail If you have any lab test that is abnormal or we need to change your treatment, we will call you to review the results.  Testing/Procedures: Echocardiogram - Your physician has requested that you have an echocardiogram. Echocardiography is a painless test that uses sound waves to create images of your heart. It provides your doctor with information about the size and shape of your heart and how well your heart's chambers and valves are working. This procedure takes approximately one hour. There are no restrictions for this procedure. This will be performed at our [facility] location - [address], [address].  Follow-Up: IN 6 WEEKS In Person You may see Shelva Majestic, MD or one of the following Advanced Practice Providers on your designated Care Team:  Almyra Deforest, PA-C Fabian Sharp, PA-C or [place], [place].    At [facility], you and your health needs are our priority.  As part of our continuing mission to provide you with exceptional heart care, we have created designated Provider Care Teams.  These Care Teams include your primary Cardiologist (physician) and Advanced Practice Providers (APPs -  Physician Assistants and Nurse Practitioners) who all work together to provide you with the care you need, when you need it.  Thank you for choosing [facility] at [facility]!!

## 2019-04-06 ENCOUNTER — Encounter: Payer: Self-pay | Admitting: Cardiovascular Disease

## 2019-04-07 ENCOUNTER — Other Ambulatory Visit: Payer: Self-pay

## 2019-04-09 ENCOUNTER — Inpatient Hospital Stay (HOSPITAL_COMMUNITY)
Admission: EM | Admit: 2019-04-09 | Discharge: 2019-04-13 | DRG: 872 | Disposition: A | Discharge status: Home / Self Care | Payer: Medicare Other | Attending: Internal Medicine | Admitting: Internal Medicine

## 2019-04-09 ENCOUNTER — Other Ambulatory Visit: Payer: Self-pay

## 2019-04-09 ENCOUNTER — Encounter (HOSPITAL_COMMUNITY): Payer: Self-pay | Admitting: *Deleted

## 2019-04-09 ENCOUNTER — Emergency Department (HOSPITAL_COMMUNITY): Payer: Medicare Other

## 2019-04-09 DIAGNOSIS — Z8711 Personal history of peptic ulcer disease: Secondary | ICD-10-CM

## 2019-04-09 DIAGNOSIS — R6520 Severe sepsis without septic shock: Secondary | ICD-10-CM

## 2019-04-09 DIAGNOSIS — R509 Fever, unspecified: Secondary | ICD-10-CM | POA: Diagnosis present

## 2019-04-09 DIAGNOSIS — Z20828 Contact with and (suspected) exposure to other viral communicable diseases: Secondary | ICD-10-CM | POA: Diagnosis not present

## 2019-04-09 DIAGNOSIS — Z6833 Body mass index (BMI) 33.0-33.9, adult: Secondary | ICD-10-CM

## 2019-04-09 DIAGNOSIS — N3001 Acute cystitis with hematuria: Secondary | ICD-10-CM | POA: Diagnosis present

## 2019-04-09 DIAGNOSIS — N179 Acute kidney failure, unspecified: Secondary | ICD-10-CM | POA: Diagnosis not present

## 2019-04-09 DIAGNOSIS — N136 Pyonephrosis: Secondary | ICD-10-CM | POA: Diagnosis present

## 2019-04-09 DIAGNOSIS — Z833 Family history of diabetes mellitus: Secondary | ICD-10-CM

## 2019-04-09 DIAGNOSIS — D649 Anemia, unspecified: Secondary | ICD-10-CM | POA: Diagnosis present

## 2019-04-09 DIAGNOSIS — A419 Sepsis, unspecified organism: Secondary | ICD-10-CM

## 2019-04-09 DIAGNOSIS — Z820 Family history of epilepsy and other diseases of the nervous system: Secondary | ICD-10-CM

## 2019-04-09 DIAGNOSIS — R6510 Systemic inflammatory response syndrome (SIRS) of non-infectious origin without acute organ dysfunction: Secondary | ICD-10-CM | POA: Diagnosis present

## 2019-04-09 DIAGNOSIS — K219 Gastro-esophageal reflux disease without esophagitis: Secondary | ICD-10-CM | POA: Diagnosis not present

## 2019-04-09 DIAGNOSIS — E669 Obesity, unspecified: Secondary | ICD-10-CM | POA: Diagnosis present

## 2019-04-09 DIAGNOSIS — E871 Hypo-osmolality and hyponatremia: Secondary | ICD-10-CM | POA: Diagnosis present

## 2019-04-09 DIAGNOSIS — Z87891 Personal history of nicotine dependence: Secondary | ICD-10-CM

## 2019-04-09 DIAGNOSIS — R531 Weakness: Secondary | ICD-10-CM | POA: Diagnosis not present

## 2019-04-09 DIAGNOSIS — Z96611 Presence of right artificial shoulder joint: Secondary | ICD-10-CM | POA: Diagnosis present

## 2019-04-09 DIAGNOSIS — Z8249 Family history of ischemic heart disease and other diseases of the circulatory system: Secondary | ICD-10-CM

## 2019-04-09 DIAGNOSIS — Z96612 Presence of left artificial shoulder joint: Secondary | ICD-10-CM | POA: Diagnosis present

## 2019-04-09 DIAGNOSIS — L409 Psoriasis, unspecified: Secondary | ICD-10-CM | POA: Diagnosis present

## 2019-04-09 DIAGNOSIS — M797 Fibromyalgia: Secondary | ICD-10-CM | POA: Diagnosis present

## 2019-04-09 DIAGNOSIS — Z936 Other artificial openings of urinary tract status: Secondary | ICD-10-CM

## 2019-04-09 DIAGNOSIS — C679 Malignant neoplasm of bladder, unspecified: Secondary | ICD-10-CM | POA: Diagnosis present

## 2019-04-09 DIAGNOSIS — M159 Polyosteoarthritis, unspecified: Secondary | ICD-10-CM | POA: Diagnosis present

## 2019-04-09 DIAGNOSIS — R000 Tachycardia, unspecified: Secondary | ICD-10-CM | POA: Diagnosis not present

## 2019-04-09 DIAGNOSIS — I10 Essential (primary) hypertension: Secondary | ICD-10-CM | POA: Diagnosis present

## 2019-04-09 DIAGNOSIS — G4733 Obstructive sleep apnea (adult) (pediatric): Secondary | ICD-10-CM | POA: Diagnosis present

## 2019-04-09 DIAGNOSIS — Z96653 Presence of artificial knee joint, bilateral: Secondary | ICD-10-CM | POA: Diagnosis present

## 2019-04-09 LAB — URINALYSIS, ROUTINE W REFLEX MICROSCOPIC
Bilirubin Urine: NEGATIVE
Glucose, UA: NEGATIVE mg/dL
Ketones, ur: NEGATIVE mg/dL
Nitrite: POSITIVE — AB
Protein, ur: 100 mg/dL — AB
Specific Gravity, Urine: 1.013 (ref 1.005–1.030)
WBC, UA: >50 WBC/hpf — ABNORMAL HIGH (ref 0–5)
pH: 6 (ref 5.0–8.0)

## 2019-04-09 LAB — APTT: aPTT: 42 seconds — ABNORMAL HIGH (ref 24–36)

## 2019-04-09 LAB — COMPREHENSIVE METABOLIC PANEL
ALT: 13 U/L (ref 0–44)
AST: 24 U/L (ref 15–41)
Albumin: 3.5 g/dL (ref 3.5–5.0)
Alkaline Phosphatase: 57 U/L (ref 38–126)
Anion gap: 8 (ref 5–15)
BUN: 22 mg/dL (ref 8–23)
CO2: 22 mmol/L (ref 22–32)
Calcium: 9.4 mg/dL (ref 8.9–10.3)
Chloride: 101 mmol/L (ref 98–111)
Creatinine, Ser: 1.4 mg/dL — ABNORMAL HIGH (ref 0.61–1.24)
GFR calc Af Amer: 58 mL/min — ABNORMAL LOW (ref >60)
GFR calc non Af Amer: 50 mL/min — ABNORMAL LOW (ref >60)
Glucose, Bld: 112 mg/dL — ABNORMAL HIGH (ref 70–99)
Potassium: 3.8 mmol/L (ref 3.5–5.1)
Sodium: 131 mmol/L — ABNORMAL LOW (ref 135–145)
Total Bilirubin: 1.2 mg/dL (ref 0.3–1.2)
Total Protein: 7.9 g/dL (ref 6.5–8.1)

## 2019-04-09 LAB — CBC WITH DIFFERENTIAL/PLATELET
Abs Immature Granulocytes: 0.13 10*3/uL — ABNORMAL HIGH (ref 0.00–0.07)
Basophils Absolute: 0 10*3/uL (ref 0.0–0.1)
Basophils Relative: 0 %
Eosinophils Absolute: 0.1 10*3/uL (ref 0.0–0.5)
Eosinophils Relative: 0 %
HCT: 29.9 % — ABNORMAL LOW (ref 39.0–52.0)
Hemoglobin: 9.5 g/dL — ABNORMAL LOW (ref 13.0–17.0)
Immature Granulocytes: 1 %
Lymphocytes Relative: 3 %
Lymphs Abs: 0.6 10*3/uL — ABNORMAL LOW (ref 0.7–4.0)
MCH: 28 pg (ref 26.0–34.0)
MCHC: 31.8 g/dL (ref 30.0–36.0)
MCV: 88.2 fL (ref 80.0–100.0)
Monocytes Absolute: 1.8 10*3/uL — ABNORMAL HIGH (ref 0.1–1.0)
Monocytes Relative: 10 %
Neutro Abs: 15.3 10*3/uL — ABNORMAL HIGH (ref 1.7–7.7)
Neutrophils Relative %: 86 %
Platelets: 187 10*3/uL (ref 150–400)
RBC: 3.39 MIL/uL — ABNORMAL LOW (ref 4.22–5.81)
RDW: 17.4 % — ABNORMAL HIGH (ref 11.5–15.5)
WBC: 17.9 10*3/uL — ABNORMAL HIGH (ref 4.0–10.5)
nRBC: 0 % (ref 0.0–0.2)

## 2019-04-09 LAB — PROTIME-INR
INR: 1.4 — ABNORMAL HIGH (ref 0.8–1.2)
Prothrombin Time: 17.3 seconds — ABNORMAL HIGH (ref 11.4–15.2)

## 2019-04-09 LAB — LACTIC ACID, PLASMA: Lactic Acid, Venous: 1 mmol/L (ref 0.5–1.9)

## 2019-04-09 MED ORDER — ONDANSETRON HCL 4 MG/2ML IJ SOLN: 4.0000 mg | Freq: Four times a day (QID) | INTRAMUSCULAR | Status: DC | PRN

## 2019-04-09 MED ORDER — PIPERACILLIN-TAZOBACTAM 3.375 G IVPB
3.3750 g | Freq: Three times a day (TID) | INTRAVENOUS | Status: DC
  Administered 2019-04-10 (×3): 3.375 g via INTRAVENOUS
  Filled 2019-04-09 (×3): qty 50

## 2019-04-09 MED ORDER — PIPERACILLIN-TAZOBACTAM 3.375 G IVPB 30 MIN
3.3750 g | INTRAVENOUS | Status: AC
  Administered 2019-04-09: 3.375 g via INTRAVENOUS
  Filled 2019-04-09: qty 50

## 2019-04-09 MED ORDER — ACETAMINOPHEN 650 MG RE SUPP: 650.0000 mg | Freq: Four times a day (QID) | RECTAL | Status: DC | PRN

## 2019-04-09 MED ORDER — ONDANSETRON HCL 4 MG PO TABS: 4.0000 mg | ORAL_TABLET | Freq: Four times a day (QID) | ORAL | Status: DC | PRN

## 2019-04-09 MED ORDER — SODIUM CHLORIDE 0.9 % IV SOLN
INTRAVENOUS | Status: AC
  Administered 2019-04-09 – 2019-04-10 (×3): via INTRAVENOUS

## 2019-04-09 MED ORDER — ACETAMINOPHEN 325 MG PO TABS
650.0000 mg | ORAL_TABLET | Freq: Once | ORAL | Status: AC | PRN
  Administered 2019-04-09: 650 mg via ORAL
  Filled 2019-04-09: qty 2

## 2019-04-09 MED ORDER — ENOXAPARIN SODIUM 40 MG/0.4ML ~~LOC~~ SOLN
40.0000 mg | Freq: Every day | SUBCUTANEOUS | Status: DC
  Administered 2019-04-10 – 2019-04-12 (×4): 40 mg via SUBCUTANEOUS
  Filled 2019-04-09 (×4): qty 0.4

## 2019-04-09 MED ORDER — DOCUSATE SODIUM 100 MG PO CAPS
200.0000 mg | ORAL_CAPSULE | Freq: Every day | ORAL | Status: DC
  Administered 2019-04-10 – 2019-04-13 (×4): 200 mg via ORAL
  Filled 2019-04-09 (×4): qty 2

## 2019-04-09 MED ORDER — ENSURE ENLIVE PO LIQD
237.0000 mL | Freq: Two times a day (BID) | ORAL | Status: DC
  Administered 2019-04-10 – 2019-04-13 (×6): 237 mL via ORAL
  Filled 2019-04-09 (×2): qty 237

## 2019-04-09 MED ORDER — ACETAMINOPHEN 325 MG PO TABS
650.0000 mg | ORAL_TABLET | Freq: Four times a day (QID) | ORAL | Status: DC | PRN
  Administered 2019-04-11 – 2019-04-12 (×3): 650 mg via ORAL
  Filled 2019-04-09 (×3): qty 2

## 2019-04-09 NOTE — Progress Notes (Signed)
A consult was received from an ED provider for Zosyn per pharmacy dosing.  The patient's profile has been reviewed for ht/wt/allergies/indication/available labs.    A one time order has been placed for Zosyn 3.375g IV over 30 minutes.  Further antibiotics/pharmacy consults should be ordered by admitting physician if indicated.                       Thank you, Luiz Ochoa 04/09/2019  6:44 PM

## 2019-04-09 NOTE — ED Notes (Signed)
Un able to obtain second set of cultures/IV line. Will consult second RN

## 2019-04-09 NOTE — ED Notes (Signed)
Pt given extra blankets at this time.

## 2019-04-09 NOTE — ED Triage Notes (Signed)
Pt states he feels as if he has another kidney infection, states he has had multiple ones since June 10. Fever last night 101.6.

## 2019-04-09 NOTE — ED Notes (Signed)
X-ray at bedside

## 2019-04-09 NOTE — H&P (Signed)
History and Physical    Billy Rasmussen [id number] DOB: Nov 14, 1945 DOA: 04/09/2019  PCP: Terald Sleeper, PA-C  Patient coming from: Home.  Chief Complaint: Fever chills.  HPI: Billy Rasmussen is a 73 y.o. male with history of recurrent bladder cancer status post cystoscopy prostatectomy with urostomy, chronic anemia, hypertension was admitted last month for sepsis secondary UTI at the time patient stent was removed from the left kidney and was discharged home on IV antibiotics with a PICC line which was discontinued about 2 weeks ago presents to the ER because of fever chills ongoing for last 24 hours.  Denies any nausea vomiting abdominal pain chest pain or shortness of breath.  Denies any diarrhea or any productive cough.  ED Course: In the ER patient was tachycardic with temperature 102 F.  Chest x-ray was unremarkable COVID-19 test was negative.  UA is consistent with UTI but abdomen appears benign.  Patient admitted for possible developing sepsis secondary to UTI.  Labs show WBC count of 17.9 hemoglobin 9.5 platelets 187 creatinine 1.4 which is increased from 1.6 at discharge 20 days ago.  Sodium 131.  Review of Systems: As per HPI, rest all negative.   Past Medical History:  Diagnosis Date   Arthritis    knees, shoulders, elbows   Bladder cancer [facility])    urologist-  dr eskridge   Diverticulosis of colon    Fibromyalgia    GERD (gastroesophageal reflux disease)    History of diverticulitis of colon    History of gastric ulcer    due to aleve   Hypertension    Lower urinary tract symptoms (LUTS)    OSA (obstructive sleep apnea)    NON-COMPLIANT  CPAP  --- BUT PT USES OXYGEN AT NIGHT 2.5L VIA [place] (PT'S DECISION)   PONV (postoperative nausea and vomiting)    Psoriasis    Tinnitus    right ear more, has tranmitter in right ear removable at hs   Wears glasses    Wears partial dentures     Past Surgical History:  Procedure Laterality Date   APPENDECTOMY      COLECTOMY W/ COLOSTOMY  1996   W/   APPENDECTOMY   COLONOSCOPY N/A 05/11/2018   Procedure: COLONOSCOPY;  Surgeon: Daneil Dolin, MD;  [facility];  Service: Endoscopy;  Laterality: N/A;  9:30   COLOSTOMY TAKEDOWN  1996   CYSTOSCOPY WITH BIOPSY N/A 12/05/2013   Procedure: CYSTO BLADDER BIOPSY AND FULGERATION;  Surgeon: Festus Aloe, MD;  Location: [facility];  Service: Urology;  Laterality: N/A;   CYSTOSCOPY WITH BIOPSY Bilateral 11/13/2014   Procedure: CYSTOSCOPY WITH  BLADDER BIOPSY FULGERATION AND BILATERAL RETROGRADE PYELOGRAMS;  Surgeon: Festus Aloe, MD;  Location: [facility];  Service: Urology;  Laterality: Bilateral;   CYSTOSCOPY WITH FULGERATION N/A 01/18/2018   Procedure: CYSTOSCOPY WITH FULGERATION/ BLADDER BIOPSY;  Surgeon: Festus Aloe, MD;  Location: [facility];  Service: Urology;  Laterality: N/A;   CYSTOSCOPY WITH INJECTION N/A 11/09/2018   Procedure: CYSTOSCOPY WITH INJECTION OF INDOCYANINE GREEN DYE;  Surgeon: Alexis Frock, MD;  Location: [facility];  Service: Urology;  Laterality: N/A;   CYSTOSCOPY WITH INSERTION OF UROLIFT N/A 01/18/2018   Procedure: CYSTOSCOPY WITH INSERTION OF UROLIFT;  Surgeon: Festus Aloe, MD;  Location: [facility];  Service: Urology;  Laterality: N/A;   CYSTOSCOPY/URETEROSCOPY/HOLMIUM LASER Left 02/17/2019   Procedure: URETEROSCOPY WITH BALLOON DILATION  AND NEPHROSTOGRAM;  Surgeon: Alexis Frock, MD;  Location: [place];  Service: Urology;  Laterality: Left;  1 HR   EXCISION RIGHT UPPER ARM LIPOMA  2005   HEMORROIDECTOMY     INGUINAL HERNIA REPAIR Left 1984   IR NEPHROSTOMY PLACEMENT LEFT  01/05/2019   KNEE ARTHROSCOPY Left X3  LAST ONE  2002   ORIF LEFT HUMEROUS FX  1976   POLYPECTOMY  05/11/2018   Procedure: POLYPECTOMY;  Surgeon: Daneil Dolin, MD;  [facility];  Service: Endoscopy;;   PROSTATE SURGERY       TONSILLECTOMY  AS CHILD   TOTAL KNEE ARTHROPLASTY Left 2006   REVISION 2007  (AFTER I & D WITH ANTIBIOTIC SPACER PROCEDURE FOR STEPH INFECTION)   TOTAL KNEE ARTHROPLASTY Right 03/30/2016   Procedure: RIGHT TOTAL KNEE ARTHROPLASTY;  Surgeon: Paralee Cancel, MD;  Location: [facility];  Service: Orthopedics;  Laterality: Right;   TOTAL SHOULDER ARTHROPLASTY Right 04/06/2013   Procedure: RIGHT TOTAL SHOULDER ARTHROPLASTY;  Surgeon: Marin Shutter, MD;  Location: [place];  Service: Orthopedics;  Laterality: Right;   TOTAL SHOULDER ARTHROPLASTY Left 07/20/2013   Procedure: LEFT TOTAL SHOULDER ARTHROPLASTY;  Surgeon: Marin Shutter, MD;  Location: [place];  Service: Orthopedics;  Laterality: Left;   TRANSURETHRAL RESECTION OF BLADDER TUMOR N/A 11/12/2015   Procedure: TRANSURETHRAL RESECTION OF BLADDER TUMOR (TURBT);  Surgeon: Festus Aloe, MD;  Location: [facility];  Service: Urology;  Laterality: N/A;   TRANSURETHRAL RESECTION OF BLADDER TUMOR WITH GYRUS (TURBT-GYRUS) N/A 12/05/2013   Procedure: TRANSURETHRAL RESECTION OF BLADDER TUMOR WITH GYRUS (TURBT-GYRUS);  Surgeon: Festus Aloe, MD;  Location: [facility];  Service: Urology;  Laterality: N/A;     reports that he quit smoking about 21 years ago. His smoking use included cigarettes. He has a 80.00 pack-year smoking history. He has never used smokeless tobacco. He reports that he does not drink alcohol or use drugs.  No Known Allergies  Family History  Problem Relation Age of Onset   Alzheimer's disease Mother    Heart attack Father    Heart disease Father    Irregular heart beat Brother        DEFIB.   Congestive Heart Failure Brother    Diabetes Daughter     Prior to Admission medications   Medication Sig Start Date End Date Taking? Authorizing Provider  diclofenac (VOLTAREN) 75 MG EC tablet TAKE 1 TABLET BY MOUTH TWICE A DAY Patient taking differently: Take 75 mg by mouth 2 (two) times daily.   02/08/19   Terald Sleeper, PA-C  docusate sodium (COLACE) 100 MG capsule Take 200 mg by mouth daily.    [provider]  feeding supplement, ENSURE ENLIVE, (ENSURE ENLIVE) LIQD Take 237 mLs by mouth 2 (two) times daily between meals. 03/21/19   Dhungel, Nishant, MD  lisinopril-hydrochlorothiazide (ZESTORETIC) 20-12.5 MG tablet TAKE 1 TABLET BY MOUTH EVERY DAY Patient taking differently: Take 1 tablet by mouth daily.  03/01/19   Terald Sleeper, PA-C  meclizine (ANTIVERT) 25 MG tablet Take 25 mg by mouth 3 (three) times daily as needed for dizziness.    [provider]  Multiple Vitamin (MULTIVITAMIN WITH MINERALS) TABS tablet Take 1 tablet by mouth daily.    [provider]  Polyethyl Glycol-Propyl Glycol (SYSTANE ULTRA OP) Place 1 drop into both eyes daily as needed (dry eyes).    [provider]    Physical Exam: Constitutional: Moderately built and nourished. Vitals:   04/09/19 1905 04/09/19 1907 04/09/19  1930 04/09/19 2100  BP:   110/68 104/66  Pulse:   89 86  Resp:   17 16  Temp: 99.7 F (37.6 C)     TempSrc:      SpO2:   97% 98%  Weight:  93 kg    Height:  5\' 6"  (1.676 m)     Eyes: Anicteric no pallor. ENMT: No discharge from the ears eyes nose or mouth. Neck: No mass felt.  No neck rigidity. Respiratory: No rhonchi or crepitations. Cardiovascular: S1-S2 heard. Abdomen: Soft nontender bowel sounds present. Musculoskeletal: No edema.  No joint effusion. Skin: No rash. Neurologic: Alert awake oriented to time place and person.  Moves all extremities. Psychiatric: Appears normal per normal affect.   Labs on Admission: I have personally reviewed following labs and imaging studies  CBC: Recent Labs  Lab 04/09/19 1645  WBC 17.9*  NEUTROABS 15.3*  HGB 9.5*  HCT 29.9*  MCV 88.2  PLT 123XX123   Basic Metabolic Panel: Recent Labs  Lab 04/09/19 1645  NA 131*  K 3.8  CL 101  CO2 22  GLUCOSE 112*  BUN 22  CREATININE 1.40*  CALCIUM 9.4    GFR: Estimated Creatinine Clearance: 51.7 mL/min (A) (by C-G formula based on SCr of 1.4 mg/dL (H)). Liver Function Tests: Recent Labs  Lab 04/09/19 1645  AST 24  ALT 13  ALKPHOS 57  BILITOT 1.2  PROT 7.9  ALBUMIN 3.5   No results for input(s): LIPASE, AMYLASE in the last 168 hours. No results for input(s): AMMONIA in the last 168 hours. Coagulation Profile: Recent Labs  Lab 04/09/19 1838  INR 1.4*   Cardiac Enzymes: No results for input(s): CKTOTAL, CKMB, CKMBINDEX, TROPONINI in the last 168 hours. BNP (last 3 results) No results for input(s): PROBNP in the last 8760 hours. HbA1C: No results for input(s): HGBA1C in the last 72 hours. CBG: No results for input(s): GLUCAP in the last 168 hours. Lipid Profile: No results for input(s): CHOL, HDL, LDLCALC, TRIG, CHOLHDL, LDLDIRECT in the last 72 hours. Thyroid Function Tests: No results for input(s): TSH, T4TOTAL, FREET4, T3FREE, THYROIDAB in the last 72 hours. Anemia Panel: No results for input(s): VITAMINB12, FOLATE, FERRITIN, TIBC, IRON, RETICCTPCT in the last 72 hours. Urine analysis:    Component Value Date/Time   COLORURINE YELLOW 04/09/2019 1645   APPEARANCEUR CLOUDY (A) 04/09/2019 1645   APPEARANCEUR Cloudy (A) 01/27/2018 1625   LABSPEC 1.013 04/09/2019 1645   PHURINE 6.0 04/09/2019 1645   GLUCOSEU NEGATIVE 04/09/2019 1645   HGBUR SMALL (A) 04/09/2019 [place] 04/09/2019 1645   BILIRUBINUR Negative 01/27/2018 [place] 04/09/2019 1645   PROTEINUR 100 (A) 04/09/2019 1645   NITRITE POSITIVE (A) 04/09/2019 1645   LEUKOCYTESUR LARGE (A) 04/09/2019 1645   Sepsis Labs: @LABRCNTIP (procalcitonin:4,lacticidven:4) )No results found for this or any previous visit (from the past 240 hour(s)).   Radiological Exams on Admission: Dg Chest Port 1 View  Result Date: 04/09/2019 CLINICAL DATA:  Pt states he feels as if he has another kidney infection, states he has had multiple ones  since June 10. Fever last night 101.6. EXAM: PORTABLE CHEST 1 VIEW COMPARISON:  Chest radiograph 03/16/2019, 01/06/2019 FINDINGS: Stable cardiomediastinal contours with enlarged heart size. Right lung is clear. Tiny linear opacity at the left lung base likely atelectasis or scarring. No focal consolidation. No pneumothorax or pleural effusion. Partially visualized bilateral shoulder arthroplasty. IMPRESSION: No evidence of active disease in the chest. Electronically Signed  By: Audie Pinto M.D.   On: 04/09/2019 17:18     Assessment/Plan Principal Problem:   SIRS (systemic inflammatory response syndrome) (HCC) Active Problems:   HTN (hypertension)   Acute cystitis with hematuria   Bladder cancer (HCC)   AKI (acute kidney injury) (HCC)   Fever    1. SIRS with possible developing sepsis secondary UTI for which patient is placed on Zosyn.  Has had previous history of Pseudomonas bacteremia.  Follow cultures.  Continue hydration.  Will get renal sonogram to make sure there is no obstruction.  Has had recent stent removal. 2. Acute renal failure likely from sepsis.  Hold lisinopril hydrochlorothiazide continue hydration.  Follow metabolic panel.  We will get a renal sonogram. 3. Hypertension presently holding lisinopril hydrochlorothiazide due to acute renal failure.  We will keep patient on as needed IV hydralazine. 4. Chronic anemia follow CBC. 5. Recommend bladder cancer status post radical cystoprostatectomy with urostomy bag.  Followed by urologist.   DVT prophylaxis: Lovenox. Code Status: Full code. Family Communication: Discussed with patient. Disposition Plan: Home. Consults called: None. Admission status: Observation.   Rise Patience MD [facility] Pager [phone].  If 7PM-7AM, please contact night-coverage [email or web address] Password [facility]  04/09/2019, 9:36 PM

## 2019-04-09 NOTE — ED Notes (Signed)
Emptied patient's urostomy bag and put patient on 2L [place] per request for sleeping.

## 2019-04-09 NOTE — Progress Notes (Signed)
Pharmacy Antibiotic Note  DETRAVION RONCHETTI is a 73 y.o. male admitted on 04/09/2019 with sepsis.  Pharmacy has been consulted for zosyn dosing.  Plan: Zosyn 3.375g IV Q8H infused over 4hrs.   Height: 5\' 6"  (167.6 cm) Weight: 205 lb (93 kg) IBW/kg (Calculated) : 63.8  Temp (24hrs), Avg:101.3 F (38.5 C), Min:99.7 F (37.6 C), Max:102.9 F (39.4 C)  Recent Labs  Lab 04/09/19 1645  WBC 17.9*  CREATININE 1.40*  LATICACIDVEN 1.0    Estimated Creatinine Clearance: 51.7 mL/min (A) (by C-G formula based on SCr of 1.4 mg/dL (H)).    No Known Allergies  Antimicrobials this admission: Zosyn 04/09/2019 >>   Dose adjustments this admission: -  Microbiology results: -  Thank you for allowing pharmacy to be a part of this patient's care.  Nani Skillern Crowford 04/09/2019 9:40 PM

## 2019-04-09 NOTE — ED Provider Notes (Signed)
Billy Rasmussen DEPT Provider Note   CSN: [id number] Arrival date & time: 04/09/19  1629     History   Chief Complaint Chief Complaint  Patient presents with  . Fever    HPI PERL Billy Rasmussen is a 73 y.o. male with history of bladder cancer, hypertension, GERD, OSA, diverticulosis presents today for evaluation of acute onset, progressively worsening generalized weakness, fever, fatigue since yesterday.  Reports fevers up to 101.6 F at home with improvement after taking Tylenol.  Notes mild lower abdominal pain.  He is a status post urostomy, notes that his urine is darker but denies any hematuria.  Has not had a bowel movement in 2 days but denies melena or hematochezia.  Notes that urine in urostomy bag has been darker as of yesterday but no hematuria noted; otherwise has not been able to note any urinary symptoms.  Denies shortness of breath or chest pain but has noted mild dry cough. States this feels similar to prior admissions for urosepsis. Not currently on any antibiotics or undergoing chemotherapy.      The history is provided by the patient.    Past Medical History:  Diagnosis Date  . Arthritis    knees, shoulders, elbows  . Bladder cancer [facility])    urologist-  dr Junious Silk  . Diverticulosis of colon   . Fibromyalgia   . GERD (gastroesophageal reflux disease)   . History of diverticulitis of colon   . History of gastric ulcer    due to aleve  . Hypertension   . Lower urinary tract symptoms (LUTS)   . OSA (obstructive sleep apnea)    NON-COMPLIANT  CPAP  --- BUT PT USES OXYGEN AT NIGHT 2.5L VIA [place] (PT'S DECISION)  . PONV (postoperative nausea and vomiting)   . Psoriasis   . Tinnitus    right ear more, has tranmitter in right ear removable at hs  . Wears glasses   . Wears partial dentures     Patient Active Problem List   Diagnosis Date Noted  . Fever 04/09/2019  . SIRS (systemic inflammatory response syndrome) ([place]) 04/09/2019  .  Sepsis due to urinary tract infection ([place]) 02/17/2019  . Abnormal renal function 01/20/2019  . Enterococcus UTI   . Urinary tract infection without hematuria   . Acute pyelonephritis   . Gastroesophageal reflux disease   . Sepsis ([place]) 12/22/2018  . AKI (acute kidney injury) ([place]) 12/22/2018  . Bladder cancer ([place]) 11/09/2018  . 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 Acute cystitis with hematuria 10/02/2016  . Pure hypercholesterolemia 10/02/2016  . Malignant neoplasm of urinary bladder ([place]) 10/02/2016  . Lipoma 09/30/2016  . Obese 03/31/2016  . S/P right TKA 03/30/2016  . S/P knee replacement 03/30/2016  . HTN (hypertension) 02/25/2016  . Healthcare maintenance 02/25/2016  . Obesity (BMI 30-39.9) 01/07/2016  . S/P shoulder replacement 07/20/2013  . Shoulder arthritis 04/07/2013  . CELLULITIS, KNEE, LEFT 08/26/2006    Past Surgical History:  Procedure Laterality Date  . APPENDECTOMY    . COLECTOMY W/ COLOSTOMY  1996   W/   APPENDECTOMY  . COLONOSCOPY N/A 05/11/2018   Procedure: COLONOSCOPY;  Surgeon: Daneil Dolin, MD;  [facility];  Service: Endoscopy;  Laterality: N/A;  9:30  . COLOSTOMY TAKEDOWN  1996  . CYSTOSCOPY WITH BIOPSY N/A 12/05/2013   Procedure: CYSTO BLADDER BIOPSY AND FULGERATION;  Surgeon: Festus Aloe, MD;  Location: [facility];  Service: Urology;  Laterality: N/A;  . CYSTOSCOPY WITH  BIOPSY Bilateral 11/13/2014   Procedure: CYSTOSCOPY WITH  BLADDER BIOPSY FULGERATION AND BILATERAL RETROGRADE PYELOGRAMS;  Surgeon: Festus Aloe, MD;  Location: [facility];  Service: Urology;  Laterality: Bilateral;  . CYSTOSCOPY WITH FULGERATION N/A 01/18/2018   Procedure: CYSTOSCOPY WITH FULGERATION/ BLADDER BIOPSY;  Surgeon: Festus Aloe, MD;  Location[facility];  Service: Urology;  Laterality: N/A;  . CYSTOSCOPY WITH INJECTION N/A 11/09/2018   Procedure: CYSTOSCOPY WITH INJECTION OF INDOCYANINE GREEN DYE;  Surgeon:  Alexis Frock, MD;  [facility];  Service: Urology;  Laterality: N/A;  . CYSTOSCOPY WITH INSERTION OF UROLIFT N/A 01/18/2018   Procedure: CYSTOSCOPY WITH INSERTION OF UROLIFT;  Surgeon: Festus Aloe, MD;  Location: [facility];  Service: Urology;  Laterality: N/A;  . CYSTOSCOPY/URETEROSCOPY/HOLMIUM LASER Left 02/17/2019   Procedure: URETEROSCOPY WITH BALLOON DILATION  AND NEPHROSTOGRAM;  Surgeon: Alexis Frock, MD;  Location: [facility];  Service: Urology;  Laterality: Left;  1 HR  . EXCISION RIGHT UPPER ARM LIPOMA  2005  . HEMORROIDECTOMY    . INGUINAL HERNIA REPAIR Left 1984  . IR NEPHROSTOMY PLACEMENT LEFT  01/05/2019  . KNEE ARTHROSCOPY Left X3  LAST ONE  2002  . ORIF LEFT HUMEROUS FX  1976  . POLYPECTOMY  05/11/2018   Procedure: POLYPECTOMY;  Surgeon: Daneil Dolin, MD;  [facility];  Service: Endoscopy;;  . PROSTATE SURGERY    . TONSILLECTOMY  AS CHILD  . TOTAL KNEE ARTHROPLASTY Left 2006   REVISION 2007  (AFTER I & D WITH ANTIBIOTIC SPACER PROCEDURE FOR STEPH INFECTION)  . TOTAL KNEE ARTHROPLASTY Right 03/30/2016   Procedure: RIGHT TOTAL KNEE ARTHROPLASTY;  Surgeon: Paralee Cancel, MD;  Location: [facility];  Service: Orthopedics;  Laterality: Right;  . TOTAL SHOULDER ARTHROPLASTY Right 04/06/2013   Procedure: RIGHT TOTAL SHOULDER ARTHROPLASTY;  Surgeon: Marin Shutter, MD;  Location: [place];  Service: Orthopedics;  Laterality: Right;  . TOTAL SHOULDER ARTHROPLASTY Left 07/20/2013   Procedure: LEFT TOTAL SHOULDER ARTHROPLASTY;  Surgeon: Marin Shutter, MD;  Location: [place];  Service: Orthopedics;  Laterality: Left;  . TRANSURETHRAL RESECTION OF BLADDER TUMOR N/A 11/12/2015   Procedure: TRANSURETHRAL RESECTION OF BLADDER TUMOR (TURBT);  Surgeon: Festus Aloe, MD;  Location[facility];  Service: Urology;  Laterality: N/A;  . TRANSURETHRAL RESECTION OF BLADDER TUMOR WITH GYRUS (TURBT-GYRUS) N/A 12/05/2013   Procedure:  TRANSURETHRAL RESECTION OF BLADDER TUMOR WITH GYRUS (TURBT-GYRUS);  Surgeon: Festus Aloe, MD;  Location: [facility];  Service: Urology;  Laterality: N/A;        Home Medications    Prior to Admission medications   Medication Sig Start Date End Date Taking? Authorizing Provider  diclofenac (VOLTAREN) 75 MG EC tablet TAKE 1 TABLET BY MOUTH TWICE A DAY Patient taking differently: Take 75 mg by mouth 2 (two) times daily.  02/08/19  Yes Terald Sleeper, PA-C  docusate sodium (COLACE) 100 MG capsule Take 200 mg by mouth daily.   Yes [provider]  feeding supplement, ENSURE ENLIVE, (ENSURE ENLIVE) LIQD Take 237 mLs by mouth 2 (two) times daily between meals. Patient taking differently: Take 237 mLs by mouth 2 (two) times daily as needed (nutritional supplement).  03/21/19  Yes Dhungel, Nishant, MD  lisinopril-hydrochlorothiazide (ZESTORETIC) 20-12.5 MG tablet TAKE 1 TABLET BY MOUTH EVERY DAY Patient taking differently: Take 1 tablet by mouth daily.  03/01/19  Yes Terald Sleeper, PA-C  meclizine (ANTIVERT) 25 MG tablet Take 25  mg by mouth 3 (three) times daily as needed for dizziness.   Yes [provider]  Multiple Vitamin (MULTIVITAMIN WITH MINERALS) TABS tablet Take 1 tablet by mouth daily.   Yes [provider]    Family History Family History  Problem Relation Age of Onset  . Alzheimer's disease Mother   . Heart attack Father   . Heart disease Father   . Irregular heart beat Brother        DEFIB.  . Congestive Heart Failure Brother   . Diabetes Daughter     Social History Social History   Tobacco Use  . Smoking status: Former Smoker    Packs/day: 2.00    Years: 40.00    Pack years: 80.00    Types: Cigarettes    Quit date: 06/01/1997    Years since quitting: 21.8  . Smokeless tobacco: Never Used  Substance Use Topics  . Alcohol use: No  . Drug use: No     Allergies   Patient has no known allergies.   Review of Systems  Review of Systems  Constitutional: Positive for chills, fatigue and fever.  Respiratory: Positive for cough. Negative for shortness of breath.   Cardiovascular: Negative for chest pain.  Gastrointestinal: Positive for abdominal pain and constipation. Negative for diarrhea, nausea and vomiting.  Genitourinary: Negative for dysuria and hematuria.  Neurological: Positive for weakness (generalized).  All other systems reviewed and are negative.    Physical Exam Updated Vital Signs BP (!) 118/104   Pulse 79   Temp 99.7 F (37.6 C)   Resp (!) 22   Ht 5\' 6"  (1.676 m)   Wt 93 kg   SpO2 100%   BMI 33.09 kg/m   Physical Exam Vitals signs and nursing note reviewed.  Constitutional:      General: He is not in acute distress.    Appearance: He is well-developed.     Comments: Appears fatigued  HENT:     Head: Normocephalic and atraumatic.  Eyes:     General:        Right eye: No discharge.        Left eye: No discharge.     Conjunctiva/sclera: Conjunctivae normal.  Neck:     Vascular: No JVD.     Trachea: No tracheal deviation.  Cardiovascular:     Rate and Rhythm: Regular rhythm. Tachycardia present.     Pulses: Normal pulses.     Heart sounds: Normal heart sounds.  Pulmonary:     Effort: Pulmonary effort is normal.     Breath sounds: Normal breath sounds.  Abdominal:     General: Bowel sounds are normal. There is no distension.     Palpations: Abdomen is soft.     Tenderness: There is no abdominal tenderness. There is no guarding or rebound.     Comments: Urostomy to right side of the abdomen, bag filled with dark yellow, nonbloody urine.  No surrounding erythema, induration, or tenderness to palpation  Skin:    General: Skin is warm and dry.     Findings: No erythema.  Neurological:     Mental Status: He is alert.  Psychiatric:        Behavior: Behavior normal.      ED Treatments / Results  Labs (all labs ordered are listed, but only abnormal results are  displayed) Labs Reviewed  COMPREHENSIVE METABOLIC PANEL - Abnormal; Notable for the following components:      Result Value   Sodium 131 (*)  Glucose, Bld 112 (*)    Creatinine, Ser 1.40 (*)    GFR calc non Af Amer 50 (*)    GFR calc Af Amer 58 (*)    All other components within normal limits  CBC WITH DIFFERENTIAL/PLATELET - Abnormal; Notable for the following components:   WBC 17.9 (*)    RBC 3.39 (*)    Hemoglobin 9.5 (*)    HCT 29.9 (*)    RDW 17.4 (*)    Neutro Abs 15.3 (*)    Lymphs Abs 0.6 (*)    Monocytes Absolute 1.8 (*)    Abs Immature Granulocytes 0.13 (*)    All other components within normal limits  URINALYSIS, ROUTINE W REFLEX MICROSCOPIC - Abnormal; Notable for the following components:   APPearance CLOUDY (*)    Hgb urine dipstick SMALL (*)    Protein, ur 100 (*)    Nitrite POSITIVE (*)    Leukocytes,Ua LARGE (*)    WBC, UA >50 (*)    Bacteria, UA FEW (*)    All other components within normal limits  PROTIME-INR - Abnormal; Notable for the following components:   Prothrombin Time 17.3 (*)    INR 1.4 (*)    All other components within normal limits  APTT - Abnormal; Notable for the following components:   aPTT 42 (*)    All other components within normal limits  CULTURE, BLOOD (ROUTINE X 2)  CULTURE, BLOOD (ROUTINE X 2)  URINE CULTURE  SARS CORONAVIRUS 2 (TAT 6-24 HRS)  LACTIC ACID, PLASMA  BASIC METABOLIC PANEL  CBC  CBC  CREATININE, SERUM    EKG EKG Interpretation  Date/Time:  Sunday April 09 2019 16:56:05 EST Ventricular Rate:  112 PR Interval:    QRS Duration: 109 QT Interval:  327 QTC Calculation: 447 R Axis:   -24 Text Interpretation: Sinus tachycardia Borderline left axis deviation Since last tracing rate faster Confirmed by Isla Pence [phone]) on 04/09/2019 5:08:44 PM   Radiology Dg Chest Port 1 View  Result Date: 04/09/2019 CLINICAL DATA:  Pt states he feels as if he has another kidney infection, states he has had  multiple ones since June 10. Fever last night 101.6. EXAM: PORTABLE CHEST 1 VIEW COMPARISON:  Chest radiograph 03/16/2019, 01/06/2019 FINDINGS: Stable cardiomediastinal contours with enlarged heart size. Right lung is clear. Tiny linear opacity at the left lung base likely atelectasis or scarring. No focal consolidation. No pneumothorax or pleural effusion. Partially visualized bilateral shoulder arthroplasty. IMPRESSION: No evidence of active disease in the chest. Electronically Signed   By: Audie Pinto M.D.   On: 04/09/2019 17:18    Procedures .Critical Care Performed by: Renita Papa, PA-C Authorized by: Renita Papa, PA-C   Critical care provider statement:    Critical care time (minutes):  45   Critical care was necessary to treat or prevent imminent or life-threatening deterioration of the following conditions:  Sepsis   Critical care was time spent personally by me on the following activities:  Discussions with consultants, evaluation of patient's response to treatment, examination of patient, ordering and performing treatments and interventions, ordering and review of laboratory studies, ordering and review of radiographic studies, pulse oximetry, re-evaluation of patient's condition, obtaining history from patient or surrogate and review of old charts   (including critical care time)  Medications Ordered in ED Medications  docusate sodium (COLACE) capsule 200 mg (has no administration in time range)  feeding supplement (ENSURE ENLIVE) (ENSURE ENLIVE) liquid 237 mL (has no administration  in time range)  acetaminophen (TYLENOL) tablet 650 mg (has no administration in time range)    Or  acetaminophen (TYLENOL) suppository 650 mg (has no administration in time range)  ondansetron (ZOFRAN) tablet 4 mg (has no administration in time range)    Or  ondansetron (ZOFRAN) injection 4 mg (has no administration in time range)  enoxaparin (LOVENOX) injection 40 mg (has no administration  in time range)  0.9 %  sodium chloride infusion ( Intravenous New Bag/Given 04/09/19 2142)  piperacillin-tazobactam (ZOSYN) IVPB 3.375 g (has no administration in time range)  acetaminophen (TYLENOL) tablet 650 mg (650 mg Oral Given 04/09/19 1713)  piperacillin-tazobactam (ZOSYN) IVPB 3.375 g (0 g Intravenous Stopped 04/09/19 2106)     Initial Impression / Assessment and Plan / ED Course  I have reviewed the triage vital signs and the nursing notes.  Pertinent labs & imaging results that were available during my care of the patient were reviewed by me and considered in my medical decision making (see chart for details).        Billy Rasmussen was evaluated in Emergency Department on 04/10/2019 for the symptoms described in the history of present illness. He was evaluated in the context of the global COVID-19 pandemic, which necessitated consideration that the patient might be at risk for infection with the SARS-CoV-2 virus that causes COVID-19. Institutional protocols and algorithms that pertain to the evaluation of patients at risk for COVID-19 are in a state of rapid change based on information released by regulatory bodies including the CDC and federal and state organizations. These policies and algorithms were followed during the patient's care in the ED.  Patient presenting for evaluation of fever, fatigue, dark urine.  He is initially febrile to 102.9 F, tachycardic, vital signs otherwise stable.  He appears ill.  Has a history of bladder cancer followed by urology.  Has had essentially monthly admissions for urosepsis over the last few months.  This feels similar.  He was given Tylenol for his fever with improvement.  Lab work today reviewed by me shows leukocytosis, stable anemia, creatinine elevated above patient's baseline but BUN within normal limits.  UA concerning for UTI.  Most recent urine culture grew Klebsiella pneumonia which was resistant to almost every antibiotic except  piperacillin/tazobactam and imipenem.  Will give dose of pip/tazo in the ED.  Code sepsis was initiated.  He was not given 30 cc/kg fluid bolus as he was not hypotensive.  Abdomen soft and nontender, doubt acute surgical abdominal pathology.  Chest x-ray shows no acute cardiopulmonary abnormalities.  Spoke with Dr. Hal Hope with [facility] hospitalist service who agrees to assume care of patient and bring him to the hospital for further evaluation and management.  Covid test is pending.  Final Clinical Impressions(s) / ED Diagnoses   Final diagnoses:  Sepsis with acute renal failure without septic shock, due to unspecified organism, unspecified acute renal failure type [facility])    ED Discharge Orders    None       Renita Papa, PA-C 04/10/19 0053    Isla Pence, MD 04/12/19 [phone]

## 2019-04-10 ENCOUNTER — Observation Stay (HOSPITAL_COMMUNITY): Payer: Medicare Other

## 2019-04-10 ENCOUNTER — Ambulatory Visit: Payer: Medicare Other | Admitting: Physician Assistant

## 2019-04-10 ENCOUNTER — Other Ambulatory Visit: Payer: Self-pay

## 2019-04-10 DIAGNOSIS — Z87891 Personal history of nicotine dependence: Secondary | ICD-10-CM | POA: Diagnosis not present

## 2019-04-10 DIAGNOSIS — Z96612 Presence of left artificial shoulder joint: Secondary | ICD-10-CM | POA: Diagnosis present

## 2019-04-10 DIAGNOSIS — N179 Acute kidney failure, unspecified: Secondary | ICD-10-CM

## 2019-04-10 DIAGNOSIS — R509 Fever, unspecified: Secondary | ICD-10-CM

## 2019-04-10 DIAGNOSIS — Z8711 Personal history of peptic ulcer disease: Secondary | ICD-10-CM | POA: Diagnosis not present

## 2019-04-10 DIAGNOSIS — N3001 Acute cystitis with hematuria: Secondary | ICD-10-CM | POA: Diagnosis not present

## 2019-04-10 DIAGNOSIS — A419 Sepsis, unspecified organism: Secondary | ICD-10-CM | POA: Diagnosis present

## 2019-04-10 DIAGNOSIS — Z96611 Presence of right artificial shoulder joint: Secondary | ICD-10-CM | POA: Diagnosis present

## 2019-04-10 DIAGNOSIS — N135 Crossing vessel and stricture of ureter without hydronephrosis: Secondary | ICD-10-CM | POA: Diagnosis not present

## 2019-04-10 DIAGNOSIS — Z820 Family history of epilepsy and other diseases of the nervous system: Secondary | ICD-10-CM | POA: Diagnosis not present

## 2019-04-10 DIAGNOSIS — E669 Obesity, unspecified: Secondary | ICD-10-CM | POA: Diagnosis present

## 2019-04-10 DIAGNOSIS — Z833 Family history of diabetes mellitus: Secondary | ICD-10-CM | POA: Diagnosis not present

## 2019-04-10 DIAGNOSIS — G4733 Obstructive sleep apnea (adult) (pediatric): Secondary | ICD-10-CM | POA: Diagnosis present

## 2019-04-10 DIAGNOSIS — E871 Hypo-osmolality and hyponatremia: Secondary | ICD-10-CM | POA: Diagnosis present

## 2019-04-10 DIAGNOSIS — R6510 Systemic inflammatory response syndrome (SIRS) of non-infectious origin without acute organ dysfunction: Secondary | ICD-10-CM | POA: Diagnosis not present

## 2019-04-10 DIAGNOSIS — C679 Malignant neoplasm of bladder, unspecified: Secondary | ICD-10-CM

## 2019-04-10 DIAGNOSIS — D649 Anemia, unspecified: Secondary | ICD-10-CM | POA: Diagnosis present

## 2019-04-10 DIAGNOSIS — I10 Essential (primary) hypertension: Secondary | ICD-10-CM | POA: Diagnosis not present

## 2019-04-10 DIAGNOSIS — Z936 Other artificial openings of urinary tract status: Secondary | ICD-10-CM | POA: Diagnosis not present

## 2019-04-10 DIAGNOSIS — R6520 Severe sepsis without septic shock: Secondary | ICD-10-CM

## 2019-04-10 DIAGNOSIS — N136 Pyonephrosis: Secondary | ICD-10-CM | POA: Diagnosis present

## 2019-04-10 DIAGNOSIS — M159 Polyosteoarthritis, unspecified: Secondary | ICD-10-CM | POA: Diagnosis present

## 2019-04-10 DIAGNOSIS — M797 Fibromyalgia: Secondary | ICD-10-CM | POA: Diagnosis present

## 2019-04-10 DIAGNOSIS — L409 Psoriasis, unspecified: Secondary | ICD-10-CM | POA: Diagnosis present

## 2019-04-10 DIAGNOSIS — Z6833 Body mass index (BMI) 33.0-33.9, adult: Secondary | ICD-10-CM | POA: Diagnosis not present

## 2019-04-10 DIAGNOSIS — K219 Gastro-esophageal reflux disease without esophagitis: Secondary | ICD-10-CM | POA: Diagnosis present

## 2019-04-10 DIAGNOSIS — Z8249 Family history of ischemic heart disease and other diseases of the circulatory system: Secondary | ICD-10-CM | POA: Diagnosis not present

## 2019-04-10 DIAGNOSIS — N10 Acute tubulo-interstitial nephritis: Secondary | ICD-10-CM | POA: Diagnosis not present

## 2019-04-10 DIAGNOSIS — Z96653 Presence of artificial knee joint, bilateral: Secondary | ICD-10-CM | POA: Diagnosis present

## 2019-04-10 DIAGNOSIS — Z20828 Contact with and (suspected) exposure to other viral communicable diseases: Secondary | ICD-10-CM | POA: Diagnosis present

## 2019-04-10 DIAGNOSIS — N1330 Unspecified hydronephrosis: Secondary | ICD-10-CM | POA: Diagnosis not present

## 2019-04-10 LAB — BASIC METABOLIC PANEL
Anion gap: 12 (ref 5–15)
BUN: 21 mg/dL (ref 8–23)
CO2: 23 mmol/L (ref 22–32)
Calcium: 9.4 mg/dL (ref 8.9–10.3)
Chloride: 100 mmol/L (ref 98–111)
Creatinine, Ser: 1.54 mg/dL — ABNORMAL HIGH (ref 0.61–1.24)
GFR calc Af Amer: 52 mL/min — ABNORMAL LOW (ref >60)
GFR calc non Af Amer: 45 mL/min — ABNORMAL LOW (ref >60)
Glucose, Bld: 100 mg/dL — ABNORMAL HIGH (ref 70–99)
Potassium: 4.1 mmol/L (ref 3.5–5.1)
Sodium: 135 mmol/L (ref 135–145)

## 2019-04-10 LAB — CBC
HCT: 30.5 % — ABNORMAL LOW (ref 39.0–52.0)
Hemoglobin: 9.4 g/dL — ABNORMAL LOW (ref 13.0–17.0)
MCH: 27.6 pg (ref 26.0–34.0)
MCHC: 30.8 g/dL (ref 30.0–36.0)
MCV: 89.7 fL (ref 80.0–100.0)
Platelets: 192 10*3/uL (ref 150–400)
RBC: 3.4 MIL/uL — ABNORMAL LOW (ref 4.22–5.81)
RDW: 17.3 % — ABNORMAL HIGH (ref 11.5–15.5)
WBC: 14.1 10*3/uL — ABNORMAL HIGH (ref 4.0–10.5)
nRBC: 0 % (ref 0.0–0.2)

## 2019-04-10 LAB — SARS CORONAVIRUS 2 (TAT 6-24 HRS): SARS Coronavirus 2: NEGATIVE

## 2019-04-10 MED ORDER — HYDRALAZINE HCL 20 MG/ML IJ SOLN: 10.0000 mg | INTRAMUSCULAR | Status: DC | PRN

## 2019-04-10 MED ORDER — SODIUM CHLORIDE 0.9 % IV SOLN
2.0000 g | Freq: Two times a day (BID) | INTRAVENOUS | Status: DC
  Administered 2019-04-10 – 2019-04-12 (×5): 2 g via INTRAVENOUS
  Filled 2019-04-10 (×6): qty 2

## 2019-04-10 NOTE — Plan of Care (Signed)
  Problem: Activity: Goal: Risk for activity intolerance will decrease Outcome: Progressing   Problem: Coping: Goal: Level of anxiety will decrease Outcome: Progressing   Problem: Education: Goal: Knowledge of General Education information will improve Description: Including pain rating scale, medication(s)/side effects and non-pharmacologic comfort measures Outcome: Completed/Met   Problem: Clinical Measurements: Goal: Respiratory complications will improve Outcome: Completed/Met Goal: Cardiovascular complication will be avoided Outcome: Completed/Met   Problem: Pain Managment: Goal: General experience of comfort will improve Outcome: Completed/Met

## 2019-04-10 NOTE — ED Notes (Signed)
Pt given water at this time 

## 2019-04-10 NOTE — Progress Notes (Signed)
PROGRESS NOTE    Billy Rasmussen  [id number] DOB: [birth year] DOA: 04/09/2019 PCP: Terald Sleeper, PA-C   Brief Narrative:  HPI per Dr. Gean Birchwood on 04/09/2019 Billy Rasmussen is a 73 y.o. male with history of recurrent bladder cancer status post cystoscopy prostatectomy with urostomy, chronic anemia, hypertension was admitted last month for sepsis secondary UTI at the time patient stent was removed from the left kidney and was discharged home on IV antibiotics with a PICC line which was discontinued about 2 weeks ago presents to the ER because of fever chills ongoing for last 24 hours.  Denies any nausea vomiting abdominal pain chest pain or shortness of breath.  Denies any diarrhea or any productive cough.  ED Course: In the ER patient was tachycardic with temperature 102 F.  Chest x-ray was unremarkable COVID-19 test was negative.  UA is consistent with UTI but abdomen appears benign.  Patient admitted for possible developing sepsis secondary to UTI.  Labs show WBC count of 17.9 hemoglobin 9.5 platelets 187 creatinine 1.4 which is increased from 1.6 at discharge 20 days ago.  Sodium 131.  **Interim History  Sepsis physiology is improving and patient has been afebrile for last 24 hours.  Antibiotics were changed to cefepime.  Assessment & Plan:   Principal Problem:   SIRS (systemic inflammatory response syndrome) (HCC) Active Problems:   HTN (hypertension)   Acute cystitis with hematuria   Bladder cancer (HCC)   AKI (acute kidney injury) ([place])   Fever  Sepsis secondary UTI, poA -Patient was febrile, tachycardic tachypneic on admission had a leukocytosis and a source of infection in the urine -Patient was initially placed on broad-spectrum antibiotics with IV Zosyn but this will be changed to IV cefepime.  Patient does have a history of Pseudomonas bacteremia however his renal function slightly worsened and so we will change her from Zosyn to the cefepime -We will continue  IV fluid hydration with normal saline at 100 mL's per hour -Continue with antipyretics and patient has C 50 mg p.o./RC of acetaminophen every 6 hours as needed for mild pain or fever of greater than 101 -Recently had a stent removal -Continue with antiemetics with ondansetron 4 mg p.o./IV every 6 as needed for nausea -Continue to hold his lisinopril/hydrochlorothiazide given his renal worsening -WBC on admission was 17.9 and now is trended down to 14.1 Lactic acid levels 1.0 on admission -UA on admission showed a cloudy urine with small hemoglobin, large leukocytes, positive nitrites, few bacteria, 6-10 RBCs per high-power field, 0-5 squamous epithelial cells, and greater than 50 WBCs -Blood cultures showed no growth to date less than 24 hours Urine culture still pending -Continue to monitor temperature curve, cultures and repeat CBC in a.m. -Renal ultrasound showed mild hydronephrosis on the left  Acute Renal Failure/AKI -Likely from sepsis from UTI.  Hold lisinopril hydrochlorothiazide and continue hydration as above. -Avoid nephrotoxic medication, contrast dyes, hypotension if possible and renally adjust medications  -BUN/creatinine from 22/1.40 is now 21/1.54 -We will discontinue IV Zosyn and changed to IV cefepime -Continue to monitor and trend renal function -Repeat CMP in a.m.  Hypertension  -Holding lisinopril hydrochlorothiazide due to acute renal failure.   -We will keep patient on as needed IV hydralazine.  Normocytic Anemia The patient hemoglobin/hematocrit on admission was 9.5/29.9 is now 9.4/30.5 -Hemoglobin is chronically low -Check anemia panel in a.m. -Continue to monitor for signs and symptoms of bleeding; currently no overt bleeding noted  Hyponatremia -Patient's sodium level was 131  and improved to 135 after IV fluid hydration -Continue monitor and trend repeat CMP in a.m.  Bladder cancer status post radical cystoprostatectomy with urostomy bag.  -Followed by  Urology Dr. Junious Silk.    GERD/Hx of Gastric Ulcer due to NSAIDs -Does not appear to be on a PPI anymore  Obesity -Estimated body mass index is 33.09 kg/m as calculated from the following:   Height as of this encounter: 5\' 6"  (1.676 m).   Weight as of this encounter: 93 kg., -Weight Loss and Dietary Counseling given   DVT prophylaxis: Enoxaparin 40 mg sq q24h Code Status: FULL CODE Family Communication: No family present at bedside  Disposition Plan: Pending Further Clinical Improvement back to Baseline  Consultants:   None  Procedures:   Renal U/S   Antimicrobials:  Anti-infectives (From admission, onward)   Start     Dose/Rate Route Frequency Ordered Stop   04/10/19 0000  piperacillin-tazobactam (ZOSYN) IVPB 3.375 g     3.375 g 12.5 mL/hr over 240 Minutes Intravenous Every 8 hours 04/09/19 2139     04/09/19 1845  piperacillin-tazobactam (ZOSYN) IVPB 3.375 g     3.375 g 100 mL/hr over 30 Minutes Intravenous STAT 04/09/19 1842 04/09/19 2106     Subjective: Seen and examined at bedside and had no complaints.  Denies any abdominal pain.  States he did have some back pain a little bit.  No nausea or vomiting.  Has a urostomy bag in place and was slightly leaking.  No nausea or vomiting.  No other concerns reported at this time.  Objective: Vitals:   04/10/19 0329 04/10/19 0414 04/10/19 0604 04/10/19 1429  BP: 118/65  130/65 (!) 114/59  Pulse: 89  95 78  Resp: (!) 21  19   Temp: 99.7 F (37.6 C)  98.5 F (36.9 C) 99.3 F (37.4 C)  TempSrc: Oral   Oral  SpO2: 99%  100% 100%  Weight:  93 kg    Height:  5\' 6"  (1.676 m)      Intake/Output Summary (Last 24 hours) at 04/10/2019 1757 Last data filed at 04/10/2019 1500 Gross per 24 hour  Intake 1682.5 ml  Output 1050 ml  Net 632.5 ml   Filed Weights   04/09/19 1639 04/09/19 1907 04/10/19 0414  Weight: 93 kg 93 kg 93 kg   Examination: Physical Exam:  Constitutional: WN/WD obese Caucasian male in NAD and appears  calm and  Eyes: Lids and conjunctivae normal, sclerae anicteric  ENMT: External Ears, Nose appear normal. Grossly normal hearing. Mucous membranes are moist. Neck: Appears normal, supple, no cervical masses, normal ROM, no appreciable thyromegaly; no JVD Respiratory: Diminished to auscultation bilaterally, no wheezing, rales, rhonchi or crackles. Normal respiratory effort and patient is not tachypenic. No accessory muscle use. Unlabored breathing  Cardiovascular: RRR, no murmurs / rubs / gallops. S1 and S2 auscultated. Trace extremity edema. .  Abdomen: Soft, non-tender, Distended. Has a Urostomy Bag in place. Bowel sounds positive.  GU: Deferred. Musculoskeletal: No clubbing / cyanosis of digits/nails. No joint deformity upper and lower extremities. Skin: No rashes, lesions, ulcers on a limited skin evaluation. No induration; Warm and dry.  Neurologic: CN 2-12 grossly intact with no focal deficits. Romberg sign and cerebellar reflexes not assessed.  Psychiatric: Normal judgment and insight. Alert and oriented x 3. Normal mood and appropriate affect.   Data Reviewed: I have personally reviewed following labs and imaging studies  CBC: Recent Labs  Lab 04/09/19 1645 04/10/19 0434  WBC 17.9* 14.1*  NEUTROABS 15.3*  --   HGB 9.5* 9.4*  HCT 29.9* 30.5*  MCV 88.2 89.7  PLT 187 [id number]   Basic Metabolic Panel: Recent Labs  Lab 04/09/19 1645 04/10/19 0434  NA 131* 135  K 3.8 4.1  CL 101 100  CO2 22 23  GLUCOSE 112* 100*  BUN 22 21  CREATININE 1.40* 1.54*  CALCIUM 9.4 9.4   GFR: Estimated Creatinine Clearance: 47 mL/min (A) (by C-G formula based on SCr of 1.54 mg/dL (H)). Liver Function Tests: Recent Labs  Lab 04/09/19 1645  AST 24  ALT 13  ALKPHOS 57  BILITOT 1.2  PROT 7.9  ALBUMIN 3.5   No results for input(s): LIPASE, AMYLASE in the last 168 hours. No results for input(s): AMMONIA in the last 168 hours. Coagulation Profile: Recent Labs  Lab 04/09/19 1838  INR 1.4*    Cardiac Enzymes: No results for input(s): CKTOTAL, CKMB, CKMBINDEX, TROPONINI in the last 168 hours. BNP (last 3 results) No results for input(s): PROBNP in the last 8760 hours. HbA1C: No results for input(s): HGBA1C in the last 72 hours. CBG: No results for input(s): GLUCAP in the last 168 hours. Lipid Profile: No results for input(s): CHOL, HDL, LDLCALC, TRIG, CHOLHDL, LDLDIRECT in the last 72 hours. Thyroid Function Tests: No results for input(s): TSH, T4TOTAL, FREET4, T3FREE, THYROIDAB in the last 72 hours. Anemia Panel: No results for input(s): VITAMINB12, FOLATE, FERRITIN, TIBC, IRON, RETICCTPCT in the last 72 hours. Sepsis Labs: Recent Labs  Lab 04/09/19 1645  LATICACIDVEN 1.0    Recent Results (from the past 240 hour(s))  Blood Culture (routine x 2)     Status: None (Preliminary result)   Collection Time: 04/09/19  4:45 PM   Specimen: BLOOD  Result Value Ref Range Status   Specimen Description   Final    BLOOD LEFT ANTECUBITAL Performed at [facility] [address], [place], [place] [address]    Special Requests   Final    BOTTLES DRAWN AEROBIC AND ANAEROBIC Blood Culture results may not be optimal due to an excessive volume of blood received in culture bottles Performed at [facility] [address], [place], [place] [address]    Culture   Final    NO GROWTH < 24 HOURS Performed at [facility] [address], [place], [place] [address]    Report Status PENDING  Incomplete  SARS CORONAVIRUS 2 (TAT 6-24 HRS) Nasopharyngeal Nasopharyngeal Swab     Status: None   Collection Time: 04/09/19  5:14 PM   Specimen: Nasopharyngeal Swab  Result Value Ref Range Status   SARS Coronavirus 2 NEGATIVE NEGATIVE Final    Comment: (NOTE) SARS-CoV-2 target nucleic acids are NOT DETECTED. The SARS-CoV-2 RNA is generally detectable in upper and lower respiratory specimens during the acute phase of infection. Negative results do not  preclude SARS-CoV-2 infection, do not rule out co-infections with other pathogens, and should not be used as the sole basis for treatment or other patient management decisions. Negative results must be combined with clinical observations, patient history, and epidemiological information. The expected result is Negative. Fact Sheet for Patients: SugarRoll.be Fact Sheet for Healthcare Providers: https://www.woods-mathews.com/ This test is not yet approved or cleared by the [place] FDA and  has been authorized for detection and/or diagnosis of SARS-CoV-2 by FDA under an Emergency Use Authorization (EUA). This EUA will remain  in effect (meaning this test can be used) for the duration of the COVID-19 declaration under  Section 56 4(b)(1) of the Act, 21 U.S.C. section 360bbb-3(b)(1), unless the authorization is terminated or revoked sooner. Performed at [facility], [place] [address] [place], [place] [address]     Radiology Studies: US Renal  Result Date: 04/10/2019 CLINICAL DATA:  Acute renal failure. History of bladder cancer and ureteral diversion. EXAM: RENAL / URINARY TRACT ULTRASOUND COMPLETE COMPARISON:  CT abdomen and pelvis 03/16/2019. FINDINGS: Right Kidney: Renal measurements: 11.5 x 5.1 x 5.5 cm = volume: 168.5 mL . Echogenicity within normal limits. No mass or hydronephrosis visualized. Left Kidney: Renal measurements: 11.8 x 6.6 x 7.1 cm = volume: 289.0 mL. Internal external stent in place on the prior CT has been removed. Echogenicity within normal limits. Mild hydronephrosis is noted. No mass visualized. Bladder: Removed. Other: None. IMPRESSION: Mild left hydronephrosis.  The exam is otherwise negative. Electronically Signed   By: Inge Rise M.D.   On: 04/10/2019 07:06   Dg Chest Port 1 View  Result Date: 04/09/2019 CLINICAL DATA:  Pt states he feels as if he has another kidney infection, states he has had multiple ones  since June 10. Fever last night 101.6. EXAM: PORTABLE CHEST 1 VIEW COMPARISON:  Chest radiograph 03/16/2019, 01/06/2019 FINDINGS: Stable cardiomediastinal contours with enlarged heart size. Right lung is clear. Tiny linear opacity at the left lung base likely atelectasis or scarring. No focal consolidation. No pneumothorax or pleural effusion. Partially visualized bilateral shoulder arthroplasty. IMPRESSION: No evidence of active disease in the chest. Electronically Signed   By: Audie Pinto M.D.   On: 04/09/2019 17:18   Scheduled Meds: . docusate sodium  200 mg Oral Daily  . enoxaparin (LOVENOX) injection  40 mg Subcutaneous QHS  . feeding supplement (ENSURE ENLIVE)  237 mL Oral BID BM   Continuous Infusions: . sodium chloride 100 mL/hr at 04/10/19 1229  . piperacillin-tazobactam (ZOSYN)  IV 3.375 g (04/10/19 1434)    LOS: 0 days   Kerney Elbe, DO [facility] PAGER is on [place]  If 7PM-7AM, please contact night-coverage [email or web address]

## 2019-04-10 NOTE — Plan of Care (Signed)
POC initiated 

## 2019-04-11 ENCOUNTER — Telehealth: Payer: Self-pay | Admitting: Physician Assistant

## 2019-04-11 DIAGNOSIS — N1330 Unspecified hydronephrosis: Secondary | ICD-10-CM

## 2019-04-11 LAB — PHOSPHORUS: Phosphorus: 2.5 mg/dL (ref 2.5–4.6)

## 2019-04-11 LAB — CBC WITH DIFFERENTIAL/PLATELET
Abs Immature Granulocytes: 0.05 10*3/uL (ref 0.00–0.07)
Basophils Absolute: 0 10*3/uL (ref 0.0–0.1)
Basophils Relative: 0 %
Eosinophils Absolute: 0.1 10*3/uL (ref 0.0–0.5)
Eosinophils Relative: 2 %
HCT: 29.3 % — ABNORMAL LOW (ref 39.0–52.0)
Hemoglobin: 9.2 g/dL — ABNORMAL LOW (ref 13.0–17.0)
Immature Granulocytes: 1 %
Lymphocytes Relative: 10 %
Lymphs Abs: 0.8 10*3/uL (ref 0.7–4.0)
MCH: 28.2 pg (ref 26.0–34.0)
MCHC: 31.4 g/dL (ref 30.0–36.0)
MCV: 89.9 fL (ref 80.0–100.0)
Monocytes Absolute: 0.8 10*3/uL (ref 0.1–1.0)
Monocytes Relative: 11 %
Neutro Abs: 6.2 10*3/uL (ref 1.7–7.7)
Neutrophils Relative %: 76 %
Platelets: 172 10*3/uL (ref 150–400)
RBC: 3.26 MIL/uL — ABNORMAL LOW (ref 4.22–5.81)
RDW: 17.1 % — ABNORMAL HIGH (ref 11.5–15.5)
WBC: 8 10*3/uL (ref 4.0–10.5)
nRBC: 0 % (ref 0.0–0.2)

## 2019-04-11 LAB — COMPREHENSIVE METABOLIC PANEL
ALT: 12 U/L (ref 0–44)
AST: 19 U/L (ref 15–41)
Albumin: 3.1 g/dL — ABNORMAL LOW (ref 3.5–5.0)
Alkaline Phosphatase: 51 U/L (ref 38–126)
Anion gap: 9 (ref 5–15)
BUN: 21 mg/dL (ref 8–23)
CO2: 24 mmol/L (ref 22–32)
Calcium: 9.6 mg/dL (ref 8.9–10.3)
Chloride: 102 mmol/L (ref 98–111)
Creatinine, Ser: 1.41 mg/dL — ABNORMAL HIGH (ref 0.61–1.24)
GFR calc Af Amer: 58 mL/min — ABNORMAL LOW (ref >60)
GFR calc non Af Amer: 50 mL/min — ABNORMAL LOW (ref >60)
Glucose, Bld: 98 mg/dL (ref 70–99)
Potassium: 3.8 mmol/L (ref 3.5–5.1)
Sodium: 135 mmol/L (ref 135–145)
Total Bilirubin: 0.5 mg/dL (ref 0.3–1.2)
Total Protein: 7.3 g/dL (ref 6.5–8.1)

## 2019-04-11 LAB — RETICULOCYTES
Immature Retic Fract: 15.2 % (ref 2.3–15.9)
RBC.: 3.26 MIL/uL — ABNORMAL LOW (ref 4.22–5.81)
Retic Count, Absolute: 41.1 10*3/uL (ref 19.0–186.0)
Retic Ct Pct: 1.3 % (ref 0.4–3.1)

## 2019-04-11 LAB — FERRITIN: Ferritin: 197 ng/mL (ref 24–336)

## 2019-04-11 LAB — FOLATE: Folate: 18.3 ng/mL (ref >5.9)

## 2019-04-11 LAB — URINE CULTURE

## 2019-04-11 LAB — MAGNESIUM: Magnesium: 1.9 mg/dL (ref 1.7–2.4)

## 2019-04-11 LAB — IRON AND TIBC
Iron: 12 ug/dL — ABNORMAL LOW (ref 45–182)
Saturation Ratios: 6 % — ABNORMAL LOW (ref 17.9–39.5)
TIBC: 203 ug/dL — ABNORMAL LOW (ref 250–450)
UIBC: 191 ug/dL

## 2019-04-11 LAB — VITAMIN B12: Vitamin B-12: 257 pg/mL (ref 180–914)

## 2019-04-11 MED ORDER — POLYSACCHARIDE IRON COMPLEX 150 MG PO CAPS
150.0000 mg | ORAL_CAPSULE | Freq: Every day | ORAL | Status: DC
  Administered 2019-04-11 – 2019-04-12 (×2): 150 mg via ORAL
  Filled 2019-04-11 (×2): qty 1

## 2019-04-11 MED ORDER — SODIUM CHLORIDE 0.9 % IV SOLN
INTRAVENOUS | Status: DC
  Administered 2019-04-11 – 2019-04-13 (×4): via INTRAVENOUS

## 2019-04-11 NOTE — Progress Notes (Signed)
PROGRESS NOTE    Billy Rasmussen  [id number] DOB: 30-Nov-1945 DOA: 04/09/2019 PCP: Terald Sleeper, PA-C   Brief Narrative:  HPI per Dr. Gean Birchwood on 04/09/2019 Billy Rasmussen is a 73 y.o. male with history of recurrent bladder cancer status post cystoscopy prostatectomy with urostomy, chronic anemia, hypertension was admitted last month for sepsis secondary UTI at the time patient stent was removed from the left kidney and was discharged home on IV antibiotics with a PICC line which was discontinued about 2 weeks ago presents to the ER because of fever chills ongoing for last 24 hours.  Denies any nausea vomiting abdominal pain chest pain or shortness of breath.  Denies any diarrhea or any productive cough.  ED Course: In the ER patient was tachycardic with temperature 102 F.  Chest x-ray was unremarkable COVID-19 test was negative.  UA is consistent with UTI but abdomen appears benign.  Patient admitted for possible developing sepsis secondary to UTI.  Labs show WBC count of 17.9 hemoglobin 9.5 platelets 187 creatinine 1.4 which is increased from 1.6 at discharge 20 days ago.  Sodium 131.  **Interim History  Sepsis physiology is improving and patient has been afebrile for last 24 hours.  Antibiotics were changed to cefepime and WBC trending down.  Patient has prior sensitivities showed a pansensitive Pseudomonas UTI and bacteremia however his most recent UTI on 03/16/2019 due to ESBL Klebsiella pneumoniae which was sensitive to only Zosyn and imipenem.  Currently we will continue to use cefepime as he remains clinically stable at this time but will escalate to meropenem if he deteriorates.  I informed urology Dr. Phebe Colla who will see the patient either later today or tomorrow morning.  Assessment & Plan:   Principal Problem:   SIRS (systemic inflammatory response syndrome) (HCC) Active Problems:   HTN (hypertension)   Acute cystitis with hematuria   Bladder cancer (HCC)   AKI  (acute kidney injury) ([place])   Fever  Sepsis secondary UTI, poA -Patient was febrile, tachycardic tachypneic on admission had a leukocytosis and a source of infection in the urine -Sepsis physiology is improving -Patient was initially placed on broad-spectrum antibiotics with IV Zosyn but this will be changed to IV cefepime.  Patient does have a history of Pseudomonas bacteremia however his renal function slightly worsened and so we will change her from Zosyn to the cefepime;  -Of note he has had a pansensitive Pseudomonas UTI bacteremia but his most recent UTI on 03/16/2019 was due to ESBL Klebsiella pneumoniae which was only sensitive to Zosyn and imipenem.  We will continue IV cefepime for now but will have a low threshold to escalate to IV meropenem if he clinically deteriorates -Urine culture initially showed multiple species and will suggest recollection and we have repeated the urine culture still pending -Continued IV fluid hydration with normal saline at 100 mL's per hour but decrease rate to 75 mL's per hour and resumed her today after it was auto stopped yesterday -Continue with antipyretics and patient has C 50 mg p.o./RC of acetaminophen every 6 hours as needed for mild pain or fever of greater than 101 -Recently had a stent removal -Continue with antiemetics with ondansetron 4 mg p.o./IV every 6 as needed for nausea -Continue to hold his lisinopril/hydrochlorothiazide given his renal worsening -WBC on admission was 17.9 and now is trended down to 8.0 -Lactic acid levels 1.0 on admission -UA on admission showed a cloudy urine with small hemoglobin, large leukocytes, positive nitrites, few  bacteria, 6-10 RBCs per high-power field, 0-5 squamous epithelial cells, and greater than 50 WBCs -Blood cultures showed no growth to date at 2 Days -Repeat Urine culture still pending -Continue to monitor temperature curve, cultures and repeat CBC in a.m. -Renal ultrasound showed mild hydronephrosis  on the left -Urology Dr. Alexis Frock was notified and he will evaluate the patient later this afternoon or first in the morning.  Dr. Tresa Moore is concerned that the patient may end up having another left nephrostomy tube given that his left ureter has a partial stricture and dilated a few weeks ago but likely were unsuccessful  Acute Renal Failure/AKI -Likely from sepsis from UTI.  Hold lisinopril hydrochlorothiazide and continue hydration as above. -Avoid nephrotoxic medication, contrast dyes, hypotension if possible and renally adjust medications  -BUN/creatinine from 22/1.40 -> 21/1.54 -> 21/1.41 -We will discontinue IV Zosyn and changed to IV cefepime with no threshold to escalate to IV meropenem -Continue to monitor and trend renal function -Repeat CMP in a.m.  Hypertension  -Holding lisinopril hydrochlorothiazide due to acute renal failure.   -We will keep patient on as needed IV hydralazine. -Blood pressure was 129/74 today  Normocytic Anemia The patient hemoglobin/hematocrit on admission was 9.5/29.9 is now 9.2/29.3 -Hemoglobin is chronically low -Start iron supplementation with Niferex 150 mg p.o. daily and will avoid IV iron supplementation given his sepsis and infection -Checked Anemia Panel and showed iron level of 12, U IBC 191, TIBC of 203, saturation ratios of 6%, ferritin level 197, folate level 15.3, vitamin B12 level 257 -Continue to monitor for signs and symptoms of bleeding; currently no overt bleeding noted  Hyponatremia -Patient's sodium level was 131 and improved to 135 after IV fluid hydration -Resumed IV fluid hydration today at normocellular Problem from -Continue monitor and trend repeat CMP in a.m.  Bladder cancer status post radical cystoprostatectomy with urostomy bag.  -Followed by Urology Dr. Junious Silk and now by Dr. Tresa Moore    GERD/Hx of Gastric Ulcer due to NSAIDs -Does not appear to be on a PPI anymore  Obesity -Estimated body mass index is 33.09  kg/m as calculated from the following:   Height as of this encounter: 5\' 6"  (1.676 m).   Weight as of this encounter: 93 kg., -Weight Loss and Dietary Counseling given   DVT prophylaxis: Enoxaparin 40 mg sq q24h Code Status: FULL CODE Family Communication: No family present at bedside  Disposition Plan: Pending Further Clinical Improvement back to Baseline  Consultants:   Urology Dr. Tresa Moore  Procedures:   Renal U/S   Antimicrobials:  Anti-infectives (From admission, onward)   Start     Dose/Rate Route Frequency Ordered Stop   04/10/19 2200  ceFEPIme (MAXIPIME) 2 g in sodium chloride 0.9 % 100 mL IVPB     2 g 200 mL/hr over 30 Minutes Intravenous Every 12 hours 04/10/19 1804     04/10/19 0000  piperacillin-tazobactam (ZOSYN) IVPB 3.375 g  Status:  Discontinued     3.375 g 12.5 mL/hr over 240 Minutes Intravenous Every 8 hours 04/09/19 2139 04/10/19 1802   04/09/19 1845  piperacillin-tazobactam (ZOSYN) IVPB 3.375 g     3.375 g 100 mL/hr over 30 Minutes Intravenous STAT 04/09/19 1842 04/09/19 2106     Subjective: Seen and examined at bedside and he states that he is doing okay.  Denies chest pain, lightheadedness or dizziness.  No nausea or vomiting.  No abdominal complaints or pain.  Wants to know why he keeps having urinary tract infections.  States  that he had a rough night given that his urostomy bag ruptured yesterday.  I spoke with Urology and they will be by later this afternoon or tomorrow morning.  Cultures and sensitivities are still pending.  No other concerns or complaints at this time.  Objective: Vitals:   04/10/19 1429 04/10/19 2106 04/11/19 0624 04/11/19 1418  BP: (!) 114/59 125/66 (!) 142/66 129/74  Pulse: 78 79 74 78  Resp:  18 20   Temp: 99.3 F (37.4 C) 99.6 F (37.6 C) 98.5 F (36.9 C) 99.1 F (37.3 C)  TempSrc: Oral Oral Oral Oral  SpO2: 100% 96% 99% 96%  Weight:      Height:        Intake/Output Summary (Last 24 hours) at 04/11/2019 1648 Last  data filed at 04/11/2019 1421 Gross per 24 hour  Intake 1863.86 ml  Output 1225 ml  Net 638.86 ml   Filed Weights   04/09/19 1639 04/09/19 1907 04/10/19 0414  Weight: 93 kg 93 kg 93 kg   Examination: Physical Exam:  Constitutional: WN/W obese Caucasian male in NAD and appears calm   Eyes: Lids and conjunctivae normal, sclerae anicteric  ENMT: External Ears, Nose appear normal. Grossly normal hearing.  Neck: Appears normal, supple, no cervical masses, normal ROM, no appreciable thyromegaly; no JVD Respiratory: Diminished to auscultation bilaterally, no wheezing, rales, rhonchi or crackles. Normal respiratory effort and patient is not tachypenic. No accessory muscle use. Unlabored breathing  Cardiovascular: RRR, no murmurs / rubs / gallops. S1 and S2 auscultated. No extremity edema..  Abdomen: Soft, non-tender, Distended due to body habitus. Has a Urostomy bag in place connected to foley bag.. Bowel sounds positive.  GU: Deferred. Musculoskeletal: No clubbing / cyanosis of digits/nails. No joint deformity upper and lower extremities.  Skin: No rashes, lesions, ulcers on a limited skin evaluation. No induration; Warm and dry.  Neurologic: CN 2-12 grossly intact with no focal deficits.  Romberg sign and cerebellar reflexes not assessed.  Psychiatric: Normal judgment and insight. Alert and oriented x 3. Normal mood and appropriate affect.   Data Reviewed: I have personally reviewed following labs and imaging studies  CBC: Recent Labs  Lab 04/09/19 1645 04/10/19 0434 04/11/19 0447  WBC 17.9* 14.1* 8.0  NEUTROABS 15.3*  --  6.2  HGB 9.5* 9.4* 9.2*  HCT 29.9* 30.5* 29.3*  MCV 88.2 89.7 89.9  PLT 187 192 [id number]   Basic Metabolic Panel: Recent Labs  Lab 04/09/19 1645 04/10/19 0434 04/11/19 0447  NA 131* 135 135  K 3.8 4.1 3.8  CL 101 100 102  CO2 22 23 24   GLUCOSE 112* 100* 98  BUN 22 21 21   CREATININE 1.40* 1.54* 1.41*  CALCIUM 9.4 9.4 9.6  MG  --   --  1.9  PHOS  --   --   2.5   GFR: Estimated Creatinine Clearance: 51.3 mL/min (A) (by C-G formula based on SCr of 1.41 mg/dL (H)). Liver Function Tests: Recent Labs  Lab 04/09/19 1645 04/11/19 0447  AST 24 19  ALT 13 12  ALKPHOS 57 51  BILITOT 1.2 0.5  PROT 7.9 7.3  ALBUMIN 3.5 3.1*   No results for input(s): LIPASE, AMYLASE in the last 168 hours. No results for input(s): AMMONIA in the last 168 hours. Coagulation Profile: Recent Labs  Lab 04/09/19 1838  INR 1.4*   Cardiac Enzymes: No results for input(s): CKTOTAL, CKMB, CKMBINDEX, TROPONINI in the last 168 hours. BNP (last 3 results) No results for input(s): PROBNP in  the last 8760 hours. HbA1C: No results for input(s): HGBA1C in the last 72 hours. CBG: No results for input(s): GLUCAP in the last 168 hours. Lipid Profile: No results for input(s): CHOL, HDL, LDLCALC, TRIG, CHOLHDL, LDLDIRECT in the last 72 hours. Thyroid Function Tests: No results for input(s): TSH, T4TOTAL, FREET4, T3FREE, THYROIDAB in the last 72 hours. Anemia Panel: Recent Labs    04/11/19 0447  VITAMINB12 257  FOLATE 18.3  FERRITIN 197  TIBC 203*  IRON 12*  RETICCTPCT 1.3   Sepsis Labs: Recent Labs  Lab 04/09/19 1645  LATICACIDVEN 1.0    Recent Results (from the past 240 hour(s))  Blood Culture (routine x 2)     Status: None (Preliminary result)   Collection Time: 04/09/19  4:45 PM   Specimen: BLOOD  Result Value Ref Range Status   Specimen Description   Final    BLOOD LEFT ANTECUBITAL Performed at [facility] [address] [place], [place] [address]    Special Requests   Final    BOTTLES DRAWN AEROBIC AND ANAEROBIC Blood Culture results may not be optimal due to an excessive volume of blood received in culture bottles Performed at [facility] [address], [place], [place] [address]    Culture   Final    NO GROWTH 2 DAYS Performed at [facility] [address], [place], [place] [address]     Report Status PENDING  Incomplete  Urine culture     Status: Abnormal   Collection Time: 04/09/19  4:45 PM   Specimen: In/Out Cath Urine  Result Value Ref Range Status   Specimen Description   Final    IN/OUT CATH URINE Performed at [facility] [address] [place], [place] [address]    Special Requests   Final    NONE Performed at [facility], [place] [address] [place], [place] [address]    Culture MULTIPLE SPECIES PRESENT, SUGGEST RECOLLECTION (A)  Final   Report Status 04/11/2019 FINAL  Final  SARS CORONAVIRUS 2 (TAT 6-24 HRS) Nasopharyngeal Nasopharyngeal Swab     Status: None   Collection Time: 04/09/19  5:14 PM   Specimen: Nasopharyngeal Swab  Result Value Ref Range Status   SARS Coronavirus 2 NEGATIVE NEGATIVE Final    Comment: (NOTE) SARS-CoV-2 target nucleic acids are NOT DETECTED. The SARS-CoV-2 RNA is generally detectable in upper and lower respiratory specimens during the acute phase of infection. Negative results do not preclude SARS-CoV-2 infection, do not rule out co-infections with other pathogens, and should not be used as the sole basis for treatment or other patient management decisions. Negative results must be combined with clinical observations, patient history, and epidemiological information. The expected result is Negative. Fact Sheet for Patients: SugarRoll.be Fact Sheet for Healthcare Providers: https://www.woods-mathews.com/ This test is not yet approved or cleared by the [place] FDA and  has been authorized for detection and/or diagnosis of SARS-CoV-2 by FDA under an Emergency Use Authorization (EUA). This EUA will remain  in effect (meaning this test can be used) for the duration of the COVID-19 declaration under Section 56 4(b)(1) of the Act, 21 U.S.C. section 360bbb-3(b)(1), unless the authorization is terminated or revoked sooner. Performed at [facility], [place] [address], [place], [place] [address]     Radiology Studies: US Renal  Result Date: 04/10/2019 CLINICAL DATA:  Acute renal failure. History of bladder cancer and ureteral diversion. EXAM: RENAL / URINARY TRACT ULTRASOUND  COMPLETE COMPARISON:  CT abdomen and pelvis 03/16/2019. FINDINGS: Right Kidney: Renal measurements: 11.5 x 5.1 x 5.5 cm = volume: 168.5 mL . Echogenicity within normal limits. No mass or hydronephrosis visualized. Left Kidney: Renal measurements: 11.8 x 6.6 x 7.1 cm = volume: 289.0 mL. Internal external stent in place on the prior CT has been removed. Echogenicity within normal limits. Mild hydronephrosis is noted. No mass visualized. Bladder: Removed. Other: None. IMPRESSION: Mild left hydronephrosis.  The exam is otherwise negative. Electronically Signed   By: Inge Rise M.D.   On: 04/10/2019 07:06   Dg Chest Port 1 View  Result Date: 04/09/2019 CLINICAL DATA:  Pt states he feels as if he has another kidney infection, states he has had multiple ones since June 10. Fever last night 101.6. EXAM: PORTABLE CHEST 1 VIEW COMPARISON:  Chest radiograph 03/16/2019, 01/06/2019 FINDINGS: Stable cardiomediastinal contours with enlarged heart size. Right lung is clear. Tiny linear opacity at the left lung base likely atelectasis or scarring. No focal consolidation. No pneumothorax or pleural effusion. Partially visualized bilateral shoulder arthroplasty. IMPRESSION: No evidence of active disease in the chest. Electronically Signed   By: Audie Pinto M.D.   On: 04/09/2019 17:18   Scheduled Meds: . docusate sodium  200 mg Oral Daily  . enoxaparin (LOVENOX) injection  40 mg Subcutaneous QHS  . feeding supplement (ENSURE ENLIVE)  237 mL Oral BID BM   Continuous Infusions: . sodium chloride 75 mL/hr at 04/11/19 1400  . ceFEPime (MAXIPIME) IV Stopped (04/11/19 1035)    LOS: 1 day   Kerney Elbe, DO [facility] PAGER is on [place]  If 7PM-7AM, please  contact night-coverage [email or web address]

## 2019-04-12 ENCOUNTER — Other Ambulatory Visit: Payer: Self-pay | Admitting: Physician Assistant

## 2019-04-12 LAB — COMPREHENSIVE METABOLIC PANEL
ALT: 13 U/L (ref 0–44)
AST: 18 U/L (ref 15–41)
Albumin: 3.1 g/dL — ABNORMAL LOW (ref 3.5–5.0)
Alkaline Phosphatase: 51 U/L (ref 38–126)
Anion gap: 10 (ref 5–15)
BUN: 23 mg/dL (ref 8–23)
CO2: 22 mmol/L (ref 22–32)
Calcium: 9.4 mg/dL (ref 8.9–10.3)
Chloride: 105 mmol/L (ref 98–111)
Creatinine, Ser: 1.24 mg/dL (ref 0.61–1.24)
GFR calc Af Amer: >60 mL/min (ref >60)
GFR calc non Af Amer: 58 mL/min — ABNORMAL LOW (ref >60)
Glucose, Bld: 94 mg/dL (ref 70–99)
Potassium: 3.9 mmol/L (ref 3.5–5.1)
Sodium: 137 mmol/L (ref 135–145)
Total Bilirubin: 0.8 mg/dL (ref 0.3–1.2)
Total Protein: 7.6 g/dL (ref 6.5–8.1)

## 2019-04-12 LAB — CBC WITH DIFFERENTIAL/PLATELET
Abs Immature Granulocytes: 0.02 10*3/uL (ref 0.00–0.07)
Basophils Absolute: 0 10*3/uL (ref 0.0–0.1)
Basophils Relative: 0 %
Eosinophils Absolute: 0.3 10*3/uL (ref 0.0–0.5)
Eosinophils Relative: 5 %
HCT: 30.1 % — ABNORMAL LOW (ref 39.0–52.0)
Hemoglobin: 9.2 g/dL — ABNORMAL LOW (ref 13.0–17.0)
Immature Granulocytes: 0 %
Lymphocytes Relative: 12 %
Lymphs Abs: 0.6 10*3/uL — ABNORMAL LOW (ref 0.7–4.0)
MCH: 27.2 pg (ref 26.0–34.0)
MCHC: 30.6 g/dL (ref 30.0–36.0)
MCV: 89.1 fL (ref 80.0–100.0)
Monocytes Absolute: 0.6 10*3/uL (ref 0.1–1.0)
Monocytes Relative: 10 %
Neutro Abs: 3.8 10*3/uL (ref 1.7–7.7)
Neutrophils Relative %: 73 %
Platelets: 199 10*3/uL (ref 150–400)
RBC: 3.38 MIL/uL — ABNORMAL LOW (ref 4.22–5.81)
RDW: 16.4 % — ABNORMAL HIGH (ref 11.5–15.5)
WBC: 5.3 10*3/uL (ref 4.0–10.5)
nRBC: 0 % (ref 0.0–0.2)

## 2019-04-12 LAB — URINE CULTURE: Culture: NO GROWTH

## 2019-04-12 LAB — MAGNESIUM: Magnesium: 1.9 mg/dL (ref 1.7–2.4)

## 2019-04-12 LAB — PHOSPHORUS: Phosphorus: 3 mg/dL (ref 2.5–4.6)

## 2019-04-12 MED ORDER — POLYVINYL ALCOHOL 1.4 % OP SOLN
1.0000 [drp] | OPHTHALMIC | Status: DC | PRN
  Administered 2019-04-12: 1 [drp] via OPHTHALMIC
  Filled 2019-04-12: qty 15

## 2019-04-12 NOTE — Progress Notes (Signed)
PROGRESS NOTE  Billy Rasmussen  DOB: 1945/08/05  PCP: Terald Sleeper, PA-C [id number]  DOA: 04/09/2019  LOS: 2 days   Chief Complaint  Patient presents with  . Fever   Brief narrative: Billy Rasmussen is a 73 y.o. male with PMH of hypertension, chronic anemia, recurrent bladder cancer s/p cystoscopy, prostatectomy with urostomy.  Patient states he has had 4-5 admissions this summer for UTI. He was last admitted last month for sepsis secondary UTI. At that time, ureteral stent was removed from the left kidney and was discharged home on IV antibiotics with a PICC line which was discontinued about 2 weeks ago. Patient presented to the ED on 11/8 with complaint fever, chills ongoing for 24 hours.   In the ED, patient had a temperature up to 102. Work-up showed WBC count 17.9, creatinine 1.4 Urinalysis showedcloudy yellow urine with large amount of leukocytes, nitrite positive and few bacteria.   Patient was admitted for sepsis secondary to UTI.    Subjective: Patient was seen and examined this morning.  Pleasant elderly Caucasian male.  Lying on bed.  Not in distress.  He is frustrated because patient is this is his 5th hospitalization for UTI.  Assessment/Plan: Sepsis secondary to UTI -Urine culture obtained on admission showed multiple species -Repeat urine culture sent on 11/8. -Previous urine culture reports show Pseudomonas, ESBL Klebsiella -Currently on IV cefepime -WBC has trended down to normal. -No fever in last 24 hours. -Renal ultrasound showed mild hydronephrosis on the left. -Urologist Dr. Tresa Moore consulted.  Pending formal consult note.   Recurrent bladder cancer s/p cystoscopy, prostatectomy with urostomy -Followed by Urology Dr. Junious Silk and now by Dr. Tresa Moore -Urostomy bag changed 10/10.    Acute kidney injury -Secondary to sepsis and continuation of lisinopril and HCTZ. -With IV hydration, creatinine improved to 1.24 today.  Hypertension -Home meds include  lisinopril, HCTZ.  Currently on hold because of AKI -Blood pressure seems to be slightly elevated.  IV hydralazine as needed.  Mobility: Encourage ambulation DVT prophylaxis:  Lovenox Code Status:   Code Status: Full Code  Family Communication:   Expected Discharge:  Home in 1 to 2 days  Consultants:  Urology  Procedures:    Antimicrobials: Anti-infectives (From admission, onward)   Start     Dose/Rate Route Frequency Ordered Stop   04/10/19 2200  ceFEPIme (MAXIPIME) 2 g in sodium chloride 0.9 % 100 mL IVPB     2 g 200 mL/hr over 30 Minutes Intravenous Every 12 hours 04/10/19 1804     04/10/19 0000  piperacillin-tazobactam (ZOSYN) IVPB 3.375 g  Status:  Discontinued     3.375 g 12.5 mL/hr over 240 Minutes Intravenous Every 8 hours 04/09/19 2139 04/10/19 1802   04/09/19 1845  piperacillin-tazobactam (ZOSYN) IVPB 3.375 g     3.375 g 100 mL/hr over 30 Minutes Intravenous STAT 04/09/19 1842 04/09/19 2106      Diet Order            Diet Heart Room service appropriate? Yes; Fluid consistency: Thin  Diet effective now              Infusions:  . sodium chloride 75 mL/hr at 04/12/19 1151  . ceFEPime (MAXIPIME) IV 2 g (04/12/19 1136)    Scheduled Meds: . docusate sodium  200 mg Oral Daily  . enoxaparin (LOVENOX) injection  40 mg Subcutaneous QHS  . feeding supplement (ENSURE ENLIVE)  237 mL Oral BID BM  . iron polysaccharides  150 mg Oral  Daily    PRN meds: acetaminophen **OR** acetaminophen, hydrALAZINE, ondansetron **OR** ondansetron (ZOFRAN) IV   Objective: Vitals:   04/12/19 0601 04/12/19 1313  BP: (!) 154/80 (!) 145/86  Pulse: 74 93  Resp: 18 18  Temp: 98.5 F (36.9 C) 97.7 F (36.5 C)  SpO2: 95% 95%    Intake/Output Summary (Last 24 hours) at 04/12/2019 1523 Last data filed at 04/12/2019 1143 Gross per 24 hour  Intake 1727.37 ml  Output 3150 ml  Net -1422.63 ml   Filed Weights   04/09/19 1907 04/10/19 0414 04/12/19 0500  Weight: 93 kg 93 kg  92.1 kg   Weight change:  Body mass index is 32.77 kg/m.   Physical Exam: General exam: Appears calm and comfortable.  Skin: No rashes, lesions or ulcers. HEENT: Atraumatic, normocephalic, supple neck, no obvious bleeding Lungs: Clear to auscultation bilaterally CVS: Regular rate and rhythm, no murmur GI/Abd soft, nontender, nondistended, bowel sound present.  Urostomy bag site clean.  No tenderness around the area CNS: Alert, awake, oriented x3 Psychiatry: Mood appropriate Extremities: No pedal edema, no calf tenderness  Data Review: I have personally reviewed the laboratory data and studies available.  Recent Labs  Lab 04/09/19 1645 04/10/19 0434 04/11/19 0447 04/12/19 0523  WBC 17.9* 14.1* 8.0 5.3  NEUTROABS 15.3*  --  6.2 3.8  HGB 9.5* 9.4* 9.2* 9.2*  HCT 29.9* 30.5* 29.3* 30.1*  MCV 88.2 89.7 89.9 89.1  PLT 187 192 172 199   Recent Labs  Lab 04/09/19 1645 04/10/19 0434 04/11/19 0447 04/12/19 0523  NA 131* 135 135 137  K 3.8 4.1 3.8 3.9  CL 101 100 102 105  CO2 22 23 24 22   GLUCOSE 112* 100* 98 94  BUN 22 21 21 23   CREATININE 1.40* 1.54* 1.41* 1.24  CALCIUM 9.4 9.4 9.6 9.4  MG  --   --  1.9 1.9  PHOS  --   --  2.5 3.0    Terrilee Croak, MD  [facility] 04/12/2019

## 2019-04-12 NOTE — Consult Note (Signed)
Reason for Consult: Pyelonephritis  Referring Physician: Baird Kay DO  Billy Rasmussen is an 73 y.o. male.   HPI:   1 - Recurrent High Grade Bladder Cancer -s/p robotic cystoprostatectomy with ICG sentinal + template lymphadenctomy, extensive adhesions, and conduit diversion 10/2018 for [id number] cancer with NEGATIVE margins. BCG refractory disease prior. Urethra remains in site.   2 -RecurrentPyelonephritis -treated 11/2018 and again 12/2018, 01/2019,03/2019, 04/2019 for suspect pyelo on eval fevers and malaise. Marland KitchenUCX 03/2019 Klebsiella. UCX 04/2019 mixed growth, f/u negative on cefipime course.   3- Left Hydronephrosis Distal Partial Ureteral Stricture- mild left hydro on ER CT 12/2018 after cystoprostatectomy. Pyelo at time and therefore left neph tube placed. Antegrade ureteroscopy confirmed left distal partial stricture (long segemnt, appears to be from retrocolic tunnel to conduit, not anastamotic) and balloon dilated / antegrade stent placed 01/2019 which was removed 03/2019. Renal US 04/2019 (this admission) with very mild hydro only (stable).   Today " Arthor " is improving clinically. Afebrile now x 24 hours, Most recent UCX negative. He wants to go home.     Past Medical History:  Diagnosis Date  . Arthritis    knees, shoulders, elbows  . Bladder cancer [facility])    urologist-  dr Junious Silk  . Diverticulosis of colon   . Fibromyalgia   . GERD (gastroesophageal reflux disease)   . History of diverticulitis of colon   . History of gastric ulcer    due to aleve  . Hypertension   . Lower urinary tract symptoms (LUTS)   . OSA (obstructive sleep apnea)    NON-COMPLIANT  CPAP  --- BUT PT USES OXYGEN AT NIGHT 2.5L VIA [place] (PT'S DECISION)  . PONV (postoperative nausea and vomiting)   . Psoriasis   . Tinnitus    right ear more, has tranmitter in right ear removable at hs  . Wears glasses   . Wears partial dentures     Past Surgical History:  Procedure Laterality Date  .  APPENDECTOMY    . COLECTOMY W/ COLOSTOMY  1996   W/   APPENDECTOMY  . COLONOSCOPY N/A 05/11/2018   Procedure: COLONOSCOPY;  Surgeon: Daneil Dolin, MD;  [facility];  Service: Endoscopy;  Laterality: N/A;  9:30  . COLOSTOMY TAKEDOWN  1996  . CYSTOSCOPY WITH BIOPSY N/A 12/05/2013   Procedure: CYSTO BLADDER BIOPSY AND FULGERATION;  Surgeon: Festus Aloe, MD;  Location[facility];  Service: Urology;  Laterality: N/A;  . CYSTOSCOPY WITH BIOPSY Bilateral 11/13/2014   Procedure: CYSTOSCOPY WITH  BLADDER BIOPSY FULGERATION AND BILATERAL RETROGRADE PYELOGRAMS;  Surgeon: Festus Aloe, MD;  Location: [facility];  Service: Urology;  Laterality: Bilateral;  . CYSTOSCOPY WITH FULGERATION N/A 01/18/2018   Procedure: CYSTOSCOPY WITH FULGERATION/ BLADDER BIOPSY;  Surgeon: Festus Aloe, MD;  [facility];  Service: Urology;  Laterality: N/A;  . CYSTOSCOPY WITH INJECTION N/A 11/09/2018   Procedure: CYSTOSCOPY WITH INJECTION OF INDOCYANINE GREEN DYE;  Surgeon: Alexis Frock, MD;  [facility];  Service: Urology;  Laterality: N/A;  . CYSTOSCOPY WITH INSERTION OF UROLIFT N/A 01/18/2018   Procedure: CYSTOSCOPY WITH INSERTION OF UROLIFT;  Surgeon: Festus Aloe, MD;  Location: [facility];  Service: Urology;  Laterality: N/A;  . CYSTOSCOPY/URETEROSCOPY/HOLMIUM LASER Left 02/17/2019   Procedure: URETEROSCOPY WITH BALLOON DILATION  AND NEPHROSTOGRAM;  Surgeon: Alexis Frock, MD;  Location: [facility];  Service: Urology;  Laterality: Left;  1 HR  . EXCISION RIGHT UPPER ARM  LIPOMA  2005  . HEMORROIDECTOMY    . INGUINAL HERNIA REPAIR Left 1984  . IR NEPHROSTOMY PLACEMENT LEFT  01/05/2019  . KNEE ARTHROSCOPY Left X3  LAST ONE  2002  . ORIF LEFT HUMEROUS FX  1976  . POLYPECTOMY  05/11/2018   Procedure: POLYPECTOMY;  Surgeon: Daneil Dolin, MD;  [facility];  Service: Endoscopy;;  . PROSTATE  SURGERY    . TONSILLECTOMY  AS CHILD  . TOTAL KNEE ARTHROPLASTY Left 2006   REVISION 2007  (AFTER I & D WITH ANTIBIOTIC SPACER PROCEDURE FOR STEPH INFECTION)  . TOTAL KNEE ARTHROPLASTY Right 03/30/2016   Procedure: RIGHT TOTAL KNEE ARTHROPLASTY;  Surgeon: Paralee Cancel, MD;  [facility];  Service: Orthopedics;  Laterality: Right;  . TOTAL SHOULDER ARTHROPLASTY Right 04/06/2013   Procedure: RIGHT TOTAL SHOULDER ARTHROPLASTY;  Surgeon: Marin Shutter, MD;  Location: [place];  Service: Orthopedics;  Laterality: Right;  . TOTAL SHOULDER ARTHROPLASTY Left 07/20/2013   Procedure: LEFT TOTAL SHOULDER ARTHROPLASTY;  Surgeon: Marin Shutter, MD;  Location: [place];  Service: Orthopedics;  Laterality: Left;  . TRANSURETHRAL RESECTION OF BLADDER TUMOR N/A 11/12/2015   Procedure: TRANSURETHRAL RESECTION OF BLADDER TUMOR (TURBT);  Surgeon: Festus Aloe, MD;  Location: [facility];  Service: Urology;  Laterality: N/A;  . TRANSURETHRAL RESECTION OF BLADDER TUMOR WITH GYRUS (TURBT-GYRUS) N/A 12/05/2013   Procedure: TRANSURETHRAL RESECTION OF BLADDER TUMOR WITH GYRUS (TURBT-GYRUS);  Surgeon: Festus Aloe, MD;  Location: [facility];  Service: Urology;  Laterality: N/A;    Family History  Problem Relation Age of Onset  . Alzheimer's disease Mother   . Heart attack Father   . Heart disease Father   . Irregular heart beat Brother        DEFIB.  . Congestive Heart Failure Brother   . Diabetes Daughter     Social History:  reports that he quit smoking about 21 years ago. His smoking use included cigarettes. He has a 80.00 pack-year smoking history. He has never used smokeless tobacco. He reports that he does not drink alcohol or use drugs.  Allergies: No Known Allergies  Medications: I have reviewed the patient's current medications.  Results for orders placed or performed during the hospital encounter of 04/09/19 (from the past 48 hour(s))  Magnesium     Status: None    Collection Time: 04/11/19  4:47 AM  Result Value Ref Range   Magnesium 1.9 1.7 - 2.4 mg/dL    Comment: Performed at [facility], [place] [address] [place], [place] [address]  Phosphorus     Status: None   Collection Time: 04/11/19  4:47 AM  Result Value Ref Range   Phosphorus 2.5 2.5 - 4.6 mg/dL    Comment: Performed at [facility], [place] [address] [place], [place] [address]  Comprehensive metabolic panel     Status: Abnormal   Collection Time: 04/11/19  4:47 AM  Result Value Ref Range   Sodium 135 135 - 145 mmol/L   Potassium 3.8 3.5 - 5.1 mmol/L   Chloride 102 98 - 111 mmol/L   CO2 24 22 - 32 mmol/L   Glucose, Bld 98 70 - 99 mg/dL   BUN 21 8 - 23 mg/dL   Creatinine, Ser 1.41 (H) 0.61 - 1.24 mg/dL   Calcium 9.6 8.9 - 10.3 mg/dL   Total Protein 7.3 6.5 - 8.1 g/dL   Albumin 3.1 (L) 3.5 - 5.0 g/dL   AST  19 15 - 41 U/L   ALT 12 0 - 44 U/L   Alkaline Phosphatase 51 38 - 126 U/L   Total Bilirubin 0.5 0.3 - 1.2 mg/dL   GFR calc non Af Amer 50 (L) >60 mL/min   GFR calc Af Amer 58 (L) >60 mL/min   Anion gap 9 5 - 15    Comment: Performed at [facility], [place] [address] [place], [place] [address]  CBC with Differential/Platelet     Status: Abnormal   Collection Time: 04/11/19  4:47 AM  Result Value Ref Range   WBC 8.0 4.0 - 10.5 K/uL   RBC 3.26 (L) 4.22 - 5.81 MIL/uL   Hemoglobin 9.2 (L) 13.0 - 17.0 g/dL   HCT 29.3 (L) 39.0 - 52.0 %   MCV 89.9 80.0 - 100.0 fL   MCH 28.2 26.0 - 34.0 pg   MCHC 31.4 30.0 - 36.0 g/dL   RDW 17.1 (H) 11.5 - 15.5 %   Platelets 172 150 - 400 K/uL   nRBC 0.0 0.0 - 0.2 %   Neutrophils Relative % 76 %   Neutro Abs 6.2 1.7 - 7.7 K/uL   Lymphocytes Relative 10 %   Lymphs Abs 0.8 0.7 - 4.0 K/uL   Monocytes Relative 11 %   Monocytes Absolute 0.8 0.1 - 1.0 K/uL   Eosinophils Relative 2 %   Eosinophils Absolute 0.1 0.0 - 0.5 K/uL   Basophils Relative 0 %   Basophils Absolute 0.0 0.0 - 0.1 K/uL    Immature Granulocytes 1 %   Abs Immature Granulocytes 0.05 0.00 - 0.07 K/uL    Comment: Performed at [facility], [place] [address], [place], [place] [address]  Vitamin B12     Status: None   Collection Time: 04/11/19  4:47 AM  Result Value Ref Range   Vitamin B-12 257 180 - 914 pg/mL    Comment: (NOTE) This assay is not validated for testing neonatal or myeloproliferative syndrome specimens for Vitamin B12 levels. Performed at [facility], [place] [address], [place], [place] [address]   Folate     Status: None   Collection Time: 04/11/19  4:47 AM  Result Value Ref Range   Folate 18.3 >5.9 ng/mL    Comment: Performed at [facility], [place] [address] [place], [place] [address]  Iron and TIBC     Status: Abnormal   Collection Time: 04/11/19  4:47 AM  Result Value Ref Range   Iron 12 (L) 45 - 182 ug/dL   TIBC 203 (L) 250 - 450 ug/dL   Saturation Ratios 6 (L) 17.9 - 39.5 %   UIBC 191 ug/dL    Comment: Performed at [facility], [place] [address], [place], [place] [address]  Ferritin     Status: None   Collection Time: 04/11/19  4:47 AM  Result Value Ref Range   Ferritin 197 24 - 336 ng/mL    Comment: Performed at [facility], [place] [address], [place], [place] [address]  Reticulocytes     Status: Abnormal   Collection Time: 04/11/19  4:47 AM  Result Value Ref Range   Retic Ct Pct 1.3 0.4 - 3.1 %   RBC. 3.26 (L) 4.22 - 5.81 MIL/uL   Retic Count, Absolute 41.1 19.0 - 186.0 K/uL   Immature Retic Fract 15.2 2.3 - 15.9 %    Comment: Performed at [facility], [place] [address], [place], [place] [address]  Culture, Urine     Status: None   Collection Time: 04/11/19  8:53 AM   Specimen: Urine, Random  Result Value Ref Range   Specimen Description      URINE, RANDOM Performed at [place] [address] [place], [place] [address]    Special Requests       NONE Performed at [facility], [place] [address], [place], [place] [address]    Culture      NO GROWTH Performed at [facility], [place] [address] [place], [place] [address]    Report Status 04/12/2019 FINAL   Magnesium     Status: None   Collection Time: 04/12/19  5:23 AM  Result Value Ref Range   Magnesium 1.9 1.7 - 2.4 mg/dL    Comment: Performed at [facility], [place] [address], [place], [place] [address]  Phosphorus     Status: None   Collection Time: 04/12/19  5:23 AM  Result Value Ref Range   Phosphorus 3.0 2.5 - 4.6 mg/dL    Comment: Performed at [facility], [place] [address], [place], [place] [address]  Comprehensive metabolic panel     Status: Abnormal   Collection Time: 04/12/19  5:23 AM  Result Value Ref Range   Sodium 137 135 - 145 mmol/L   Potassium 3.9 3.5 - 5.1 mmol/L   Chloride 105 98 - 111 mmol/L   CO2 22 22 - 32 mmol/L   Glucose, Bld 94 70 - 99 mg/dL   BUN 23 8 - 23 mg/dL   Creatinine, Ser 1.24 0.61 - 1.24 mg/dL   Calcium 9.4 8.9 - 10.3 mg/dL   Total Protein 7.6 6.5 - 8.1 g/dL   Albumin 3.1 (L) 3.5 - 5.0 g/dL   AST 18 15 - 41 U/L   ALT 13 0 - 44 U/L   Alkaline Phosphatase 51 38 - 126 U/L   Total Bilirubin 0.8 0.3 - 1.2 mg/dL   GFR calc non Af Amer 58 (L) >60 mL/min   GFR calc Af Amer >60 >60 mL/min   Anion gap 10 5 - 15    Comment: Performed at [facility], [place] [address], [place], [place] [address]  CBC with Differential/Platelet     Status: Abnormal   Collection Time: 04/12/19  5:23 AM  Result Value Ref Range   WBC 5.3 4.0 - 10.5 K/uL   RBC 3.38 (L) 4.22 - 5.81 MIL/uL   Hemoglobin 9.2 (L) 13.0 - 17.0 g/dL   HCT 30.1 (L) 39.0 - 52.0 %   MCV 89.1 80.0 - 100.0 fL   MCH 27.2 26.0 - 34.0 pg   MCHC 30.6 30.0 - 36.0 g/dL   RDW 16.4 (H) 11.5 - 15.5 %   Platelets 199 150 - 400 K/uL   nRBC 0.0 0.0 - 0.2 %   Neutrophils Relative % 73 %   Neutro Abs 3.8 1.7 - 7.7 K/uL    Lymphocytes Relative 12 %   Lymphs Abs 0.6 (L) 0.7 - 4.0 K/uL   Monocytes Relative 10 %   Monocytes Absolute 0.6 0.1 - 1.0 K/uL   Eosinophils Relative 5 %   Eosinophils Absolute 0.3 0.0 - 0.5 K/uL   Basophils Relative 0 %   Basophils Absolute 0.0 0.0 - 0.1 K/uL   Immature Granulocytes 0 %   Abs Immature Granulocytes 0.02 0.00 - 0.07 K/uL    Comment: Performed at [facility], [place] [address]  [place], [place] [address]    No results found.  Review of Systems  Constitutional: Positive for malaise/fatigue.  HENT: Negative.   Eyes: Negative.   Respiratory: Negative.   Cardiovascular: Negative.   Gastrointestinal: Negative for nausea.  Genitourinary: Negative for flank pain and hematuria.  Musculoskeletal: Negative.   Skin: Negative.   Neurological: Negative.   Endo/Heme/Allergies: Negative.   Psychiatric/Behavioral: Negative.    Blood pressure (!) 145/86, pulse 93, temperature 97.7 F (36.5 C), temperature source Oral, resp. rate 18, height 5\' 6"  (1.676 m), weight 92.1 kg, SpO2 95 %. Physical Exam  Constitutional: He appears well-developed.  loqatious and in good spirits, at baseline.   HENT:  Head: Normocephalic.  Eyes: Pupils are equal, round, and reactive to light.  Neck: Normal range of motion.  Cardiovascular: Normal rate.  Respiratory: Effort normal.  GI:  RLQ Urostomy patent of copious non-foul urine. Prior scars w/o hernias. No CVAT.   Musculoskeletal: Normal range of motion.  Neurological: He is alert.  Skin: Skin is warm.  Psychiatric: He has a normal mood and affect.    Assessment/Plan:  1 - Recurrent High Grade Bladder Cancer -very good oncologic prognosis. No furhter cancer-directed care this admission.   2 -RecurrentPyelonephritis -Pt clearing infectious parameters rapidly. OK for DC from out percpsective at any point. Consider several more days PO Vantin or similar for 10 day total course.   3- Left Hydronephrosis Distal Partial  Ureteral Stricture- I remain concerned abtou recurrecne as driver of repeat infections. May require major open revision if remains recurrent now having recurred after endoscopic dilation. We discussed this and agree   He has outpatient GU follow up with [place] and we look forward to continuing there. Please call me directly with questions anytime.   Alexis Frock 04/12/2019, 8:44 PM

## 2019-04-13 MED ORDER — CEFDINIR 300 MG PO CAPS: 300.0000 mg | ORAL_CAPSULE | Freq: Two times a day (BID) | ORAL | 0 refills | Status: AC

## 2019-04-13 MED ORDER — CEFDINIR 300 MG PO CAPS
300.0000 mg | ORAL_CAPSULE | Freq: Two times a day (BID) | ORAL | Status: DC
  Administered 2019-04-13: 300 mg via ORAL
  Filled 2019-04-13 (×2): qty 1

## 2019-04-13 MED ORDER — SACCHAROMYCES BOULARDII 250 MG PO CAPS: 250.0000 mg | ORAL_CAPSULE | Freq: Two times a day (BID) | ORAL | 0 refills | Status: AC

## 2019-04-13 NOTE — Discharge Summary (Signed)
Physician Discharge Summary  DESTON GANE [id number] DOB: May 16, 1946 DOA: 04/09/2019  PCP: Terald Sleeper, PA-C  Admit date: 04/09/2019 Discharge date: 04/13/2019  Admitted From: Home Discharge disposition: Home   Code Status: Full Code  Diet Recommendation: Cardiac diet   Recommendations for Outpatient Follow-Up:   1. Follow-up with urology as an outpatient  Discharge Diagnosis:   Principal Problem:   SIRS (systemic inflammatory response syndrome) (HCC) Active Problems:   HTN (hypertension)   Acute cystitis with hematuria   Bladder cancer (HCC)   AKI (acute kidney injury) ([place])   Fever    History of Present Illness / Brief narrative:  Billy Rasmussen is a 73 y.o. male with PMH of hypertension, chronic anemia, recurrent bladder cancer s/p cystoscopy, prostatectomy with urostomy.  Patient states he has had 4-5 admissions this summer for UTI. He was last admitted last month for sepsis secondary UTI. At that time, ureteral stent was removed from the left kidney and was discharged home on IV antibiotics with a PICC line which was discontinued about 2 weeks ago. Patient presented to the ED on 11/8 with complaint fever, chills ongoing for 24 hours.   In the ED, patient had a temperature up to 102. Work-up showed WBC count 17.9, creatinine 1.4 Urinalysis showedcloudy yellow urine with large amount of leukocytes, nitrite positive and few bacteria.   Patient was admitted for sepsis secondary to UTI.   Hospital Course:  Sepsis secondary to UTI -History of recurrent pyelonephritis -Urine culture obtained on admission showed multiple species -Repeat urine culture sent on 11/8. -Previous urine culture reports show Pseudomonas, ESBL Klebsiella -Currently on IV cefepime -WBC has trended down to normal. -No fever in last 24 hours. -Renal ultrasound showed mild hydronephrosis on the left. -Urologist Dr. Tresa Moore  was consulted.   Will discharge the patient home on 10 more  days of oral Vantin with probiotics.   Recurrent bladder cancer s/p cystoscopy, prostatectomy with urostomy -Followed by Urology Dr. Rogers Seeds now by Dr. Tresa Moore -Urostomy bag changed 10/10.   -Per urology note if patient continues to have recurrent infection, he may need repeat surgery.  He will follow up with urology as an outpatient.  Acute kidney injury -Secondary to sepsis and continuation of lisinopril and HCTZ. -With IV hydration, creatinine improved to 1.24 today. -Resume lisinopril and HCTZ.  Hypertension -Home meds include lisinopril, HCTZ. Resume the same.  Stable for discharge to home today.  Subjective:  Seen and examined this morning.  Pleasant elderly Caucasian male.  Lying down in bed.  Not in distress.  No new symptoms.  Discharge Exam:   Vitals:   04/12/19 1313 04/12/19 2111 04/13/19 0528 04/13/19 0530  BP: (!) 145/86 (!) 154/89 (!) 164/83   Pulse: 93 76 72   Resp: 18 18 18    Temp: 97.7 F (36.5 C) 98.4 F (36.9 C) 98.1 F (36.7 C)   TempSrc: Oral Oral Oral   SpO2: 95% 99% 100%   Weight:    91.7 kg  Height:        Body mass index is 32.63 kg/m.  General exam: Appears calm and comfortable.  Skin: No rashes, lesions or ulcers. HEENT: Atraumatic, normocephalic, supple neck, no obvious bleeding Lungs: Clear to auscultation bilaterally CVS: Regular rate and rhythm, no murmur GI/Abd soft, nontender, nondistended, bowel sound present, urostomy bag in place. CNS: Alert, awake, oriented x3 Psychiatry: Mood appropriate Extremities: No pedal edema, no calf tenderness  Discharge Instructions:  Wound care: None Discharge Instructions  Increase activity slowly   Complete by: As directed       Allergies as of 04/13/2019   No Known Allergies     Medication List    TAKE these medications   cefdinir 300 MG capsule Commonly known as: OMNICEF Take 1 capsule (300 mg total) by mouth every 12 (twelve) hours for 10 days.   diclofenac 75 MG EC tablet  Commonly known as: VOLTAREN NEW PRESCRIPTION REQUEST: DICLOFENAC SODIUM 75MG  - TAKE ONE TABLET BY MOUTH TWICE DAILY What changed: See the new instructions.   docusate sodium 100 MG capsule Commonly known as: COLACE Take 200 mg by mouth daily.   feeding supplement (ENSURE ENLIVE) Liqd Take 237 mLs by mouth 2 (two) times daily between meals. What changed:   when to take this  reasons to take this   lisinopril-hydrochlorothiazide 20-12.5 MG tablet Commonly known as: ZESTORETIC NEW PRESCRIPTION REQUEST: LISINOPRIL 20MG -HYDROCHLOROTHIAZIDE 12.5MG  - TAKE ONE TABLET BY MOUTH DAILY What changed: See the new instructions.   meclizine 25 MG tablet Commonly known as: ANTIVERT NEW PRESCRIPTION REQUEST: MECLIZINE 25MG  - TAKE ONE TABLET BY MOUTH THREE TIMES DAILY What changed: See the new instructions.   multivitamin with minerals Tabs tablet Take 1 tablet by mouth daily.   saccharomyces boulardii 250 MG capsule Commonly known as: FLORASTOR Take 1 capsule (250 mg total) by mouth 2 (two) times daily for 10 days.       Time coordinating discharge: 25 minutes  The results of significant diagnostics from this hospitalization (including imaging, microbiology, ancillary and laboratory) are listed below for reference.    Procedures and Diagnostic Studies:   US Renal  Result Date: 04/10/2019 CLINICAL DATA:  Acute renal failure. History of bladder cancer and ureteral diversion. EXAM: RENAL / URINARY TRACT ULTRASOUND COMPLETE COMPARISON:  CT abdomen and pelvis 03/16/2019. FINDINGS: Right Kidney: Renal measurements: 11.5 x 5.1 x 5.5 cm = volume: 168.5 mL . Echogenicity within normal limits. No mass or hydronephrosis visualized. Left Kidney: Renal measurements: 11.8 x 6.6 x 7.1 cm = volume: 289.0 mL. Internal external stent in place on the prior CT has been removed. Echogenicity within normal limits. Mild hydronephrosis is noted. No mass visualized. Bladder: Removed. Other: None. IMPRESSION: Mild  left hydronephrosis.  The exam is otherwise negative. Electronically Signed   By: Inge Rise M.D.   On: 04/10/2019 07:06   Dg Chest Port 1 View  Result Date: 04/09/2019 CLINICAL DATA:  Pt states he feels as if he has another kidney infection, states he has had multiple ones since June 10. Fever last night 101.6. EXAM: PORTABLE CHEST 1 VIEW COMPARISON:  Chest radiograph 03/16/2019, 01/06/2019 FINDINGS: Stable cardiomediastinal contours with enlarged heart size. Right lung is clear. Tiny linear opacity at the left lung base likely atelectasis or scarring. No focal consolidation. No pneumothorax or pleural effusion. Partially visualized bilateral shoulder arthroplasty. IMPRESSION: No evidence of active disease in the chest. Electronically Signed   By: Audie Pinto M.D.   On: 04/09/2019 17:18     Labs:   Basic Metabolic Panel: Recent Labs  Lab 04/09/19 1645 04/10/19 0434 04/11/19 0447 04/12/19 0523  NA 131* 135 135 137  K 3.8 4.1 3.8 3.9  CL 101 100 102 105  CO2 22 23 24 22   GLUCOSE 112* 100* 98 94  BUN 22 21 21 23   CREATININE 1.40* 1.54* 1.41* 1.24  CALCIUM 9.4 9.4 9.6 9.4  MG  --   --  1.9 1.9  PHOS  --   --  2.5 3.0   GFR Estimated Creatinine Clearance: 58 mL/min (by C-G formula based on SCr of 1.24 mg/dL). Liver Function Tests: Recent Labs  Lab 04/09/19 1645 04/11/19 0447 04/12/19 0523  AST 24 19 18   ALT 13 12 13   ALKPHOS 57 51 51  BILITOT 1.2 0.5 0.8  PROT 7.9 7.3 7.6  ALBUMIN 3.5 3.1* 3.1*   No results for input(s): LIPASE, AMYLASE in the last 168 hours. No results for input(s): AMMONIA in the last 168 hours. Coagulation profile Recent Labs  Lab 04/09/19 1838  INR 1.4*    CBC: Recent Labs  Lab 04/09/19 1645 04/10/19 0434 04/11/19 0447 04/12/19 0523  WBC 17.9* 14.1* 8.0 5.3  NEUTROABS 15.3*  --  6.2 3.8  HGB 9.5* 9.4* 9.2* 9.2*  HCT 29.9* 30.5* 29.3* 30.1*  MCV 88.2 89.7 89.9 89.1  PLT 187 192 172 199   Cardiac Enzymes: No results for  input(s): CKTOTAL, CKMB, CKMBINDEX, TROPONINI in the last 168 hours. BNP: Invalid input(s): POCBNP CBG: No results for input(s): GLUCAP in the last 168 hours. D-Dimer No results for input(s): DDIMER in the last 72 hours. Hgb A1c No results for input(s): HGBA1C in the last 72 hours. Lipid Profile No results for input(s): CHOL, HDL, LDLCALC, TRIG, CHOLHDL, LDLDIRECT in the last 72 hours. Thyroid function studies No results for input(s): TSH, T4TOTAL, T3FREE, THYROIDAB in the last 72 hours.  Invalid input(s): FREET3 Anemia work up Recent Labs    04/11/19 0447  VITAMINB12 257  FOLATE 18.3  FERRITIN 197  TIBC 203*  IRON 12*  RETICCTPCT 1.3   Microbiology Recent Results (from the past 240 hour(s))  Blood Culture (routine x 2)     Status: None (Preliminary result)   Collection Time: 04/09/19  4:45 PM   Specimen: BLOOD  Result Value Ref Range Status   Specimen Description   Final    BLOOD LEFT ANTECUBITAL Performed at [facility], [place] [address] [place], [place] [address]    Special Requests   Final    BOTTLES DRAWN AEROBIC AND ANAEROBIC Blood Culture results may not be optimal due to an excessive volume of blood received in culture bottles Performed at [facility] [address], [place], [place] [address]    Culture   Final    NO GROWTH 4 DAYS Performed at [facility], [place] [address] [place], [place] [address]    Report Status PENDING  Incomplete  Urine culture     Status: Abnormal   Collection Time: 04/09/19  4:45 PM   Specimen: In/Out Cath Urine  Result Value Ref Range Status   Specimen Description   Final    IN/OUT CATH URINE Performed at [facility] [address] [place], [place] [address]    Special Requests   Final    NONE Performed at [facility], [place] [address], [place], [place] [address]    Culture MULTIPLE SPECIES PRESENT, SUGGEST RECOLLECTION (A)  Final   Report  Status 04/11/2019 FINAL  Final  SARS CORONAVIRUS 2 (TAT 6-24 HRS) Nasopharyngeal Nasopharyngeal Swab     Status: None   Collection Time: 04/09/19  5:14 PM   Specimen: Nasopharyngeal Swab  Result Value Ref Range Status   SARS Coronavirus 2 NEGATIVE NEGATIVE Final    Comment: (NOTE) SARS-CoV-2 target nucleic acids are NOT DETECTED. The SARS-CoV-2 RNA is generally detectable in upper and lower respiratory specimens during the acute phase of infection. Negative results do not preclude  SARS-CoV-2 infection, do not rule out co-infections with other pathogens, and should not be used as the sole basis for treatment or other patient management decisions. Negative results must be combined with clinical observations, patient history, and epidemiological information. The expected result is Negative. Fact Sheet for Patients: SugarRoll.be Fact Sheet for Healthcare Providers: https://www.woods-mathews.com/ This test is not yet approved or cleared by the [place] FDA and  has been authorized for detection and/or diagnosis of SARS-CoV-2 by FDA under an Emergency Use Authorization (EUA). This EUA will remain  in effect (meaning this test can be used) for the duration of the COVID-19 declaration under Section 56 4(b)(1) of the Act, 21 U.S.C. section 360bbb-3(b)(1), unless the authorization is terminated or revoked sooner. Performed at [facility], [place] [address], [place], [place] [address]   Culture, Urine     Status: None   Collection Time: 04/11/19  8:53 AM   Specimen: Urine, Random  Result Value Ref Range Status   Specimen Description   Final    URINE, RANDOM Performed at [facility] [address], [place], [place] [address]    Special Requests   Final    NONE Performed at [facility], [place] [address] [place], [place] [address]    Culture   Final    NO GROWTH Performed at [place], [place] [address] [place],  [address]    Report Status 04/12/2019 FINAL  Final    Please note: You were cared for by a hospitalist during your hospital stay. Once you are discharged, your primary care physician will handle any further medical issues. Please note that NO REFILLS for any discharge medications will be authorized once you are discharged, as it is imperative that you return to your primary care physician (or establish a relationship with a primary care physician if you do not have one) for your post hospital discharge needs so that they can reassess your need for medications and monitor your lab values.  Signed: Terrilee Croak  [facility] 04/13/2019, 11:50 AM

## 2019-04-14 ENCOUNTER — Ambulatory Visit (HOSPITAL_COMMUNITY): Payer: Medicare Other | Attending: Cardiovascular Disease

## 2019-04-14 ENCOUNTER — Other Ambulatory Visit: Payer: Self-pay

## 2019-04-14 ENCOUNTER — Other Ambulatory Visit: Payer: Medicare Other

## 2019-04-14 DIAGNOSIS — A419 Sepsis, unspecified organism: Secondary | ICD-10-CM

## 2019-04-14 DIAGNOSIS — N390 Urinary tract infection, site not specified: Secondary | ICD-10-CM | POA: Insufficient documentation

## 2019-04-14 DIAGNOSIS — I10 Essential (primary) hypertension: Secondary | ICD-10-CM

## 2019-04-14 DIAGNOSIS — R5383 Other fatigue: Secondary | ICD-10-CM

## 2019-04-14 DIAGNOSIS — Z79899 Other long term (current) drug therapy: Secondary | ICD-10-CM | POA: Diagnosis not present

## 2019-04-14 LAB — CBC
Hematocrit: 31.2 % — ABNORMAL LOW (ref 37.5–51.0)
Hemoglobin: 10.1 g/dL — ABNORMAL LOW (ref 13.0–17.7)
MCH: 27.7 pg (ref 26.6–33.0)
MCHC: 32.4 g/dL (ref 31.5–35.7)
MCV: 86 fL (ref 79–97)
Platelets: 246 10*3/uL (ref 150–450)
RBC: 3.65 x10E6/uL — ABNORMAL LOW (ref 4.14–5.80)
RDW: 15.9 % — ABNORMAL HIGH (ref 11.6–15.4)
WBC: 5.5 10*3/uL (ref 3.4–10.8)

## 2019-04-14 LAB — COMPREHENSIVE METABOLIC PANEL
ALT: 14 IU/L (ref 0–44)
AST: 27 IU/L (ref 0–40)
Albumin/Globulin Ratio: 1.1 — ABNORMAL LOW (ref 1.2–2.2)
Albumin: 4.2 g/dL (ref 3.7–4.7)
Alkaline Phosphatase: 81 IU/L (ref 39–117)
BUN/Creatinine Ratio: 17 (ref 10–24)
BUN: 23 mg/dL (ref 8–27)
Bilirubin Total: 0.3 mg/dL (ref 0.0–1.2)
CO2: 23 mmol/L (ref 20–29)
Calcium: 10.1 mg/dL (ref 8.6–10.2)
Chloride: 100 mmol/L (ref 96–106)
Creatinine, Ser: 1.36 mg/dL — ABNORMAL HIGH (ref 0.76–1.27)
GFR calc Af Amer: 60 mL/min/{1.73_m2} (ref >59)
GFR calc non Af Amer: 52 mL/min/{1.73_m2} — ABNORMAL LOW (ref >59)
Globulin, Total: 3.7 g/dL (ref 1.5–4.5)
Glucose: 82 mg/dL (ref 65–99)
Potassium: 4.5 mmol/L (ref 3.5–5.2)
Sodium: 138 mmol/L (ref 134–144)
Total Protein: 7.9 g/dL (ref 6.0–8.5)

## 2019-04-14 LAB — CULTURE, BLOOD (ROUTINE X 2): Culture: NO GROWTH

## 2019-04-14 LAB — LIPID PANEL
Chol/HDL Ratio: 8.5 ratio — ABNORMAL HIGH (ref 0.0–5.0)
Cholesterol, Total: 161 mg/dL (ref 100–199)
HDL: 19 mg/dL — ABNORMAL LOW (ref >39)
LDL Chol Calc (NIH): 107 mg/dL — ABNORMAL HIGH (ref 0–99)
Triglycerides: 198 mg/dL — ABNORMAL HIGH (ref 0–149)
VLDL Cholesterol Cal: 35 mg/dL (ref 5–40)

## 2019-04-14 LAB — TSH: TSH: 2.72 u[IU]/mL (ref 0.450–4.500)

## 2019-04-19 ENCOUNTER — Ambulatory Visit: Payer: Self-pay | Admitting: *Deleted

## 2019-04-19 NOTE — Chronic Care Management (AMB) (Signed)
  Care Management   Inpatient Referral Review  04/19/2019 Name: Billy Rasmussen MRN: [id number] DOB: Jul 08, 1945  Inpatient referral received on Mr [place] for CCM services to help with chronic care management and care coordination. He has had multiple admissions within the past year and would likely benefit from CCM services.   Follow up plan: Telephone follow up appointment with care management team member scheduled for:05/18/2019  Chong Sicilian, BSN, RN-BC [facility] Management Direct Dial: [phone]

## 2019-04-25 ENCOUNTER — Ambulatory Visit (INDEPENDENT_AMBULATORY_CARE_PROVIDER_SITE_OTHER): Payer: Medicare Other

## 2019-04-25 ENCOUNTER — Other Ambulatory Visit: Payer: Self-pay

## 2019-04-25 DIAGNOSIS — D630 Anemia in neoplastic disease: Secondary | ICD-10-CM

## 2019-04-25 DIAGNOSIS — Z974 Presence of external hearing-aid: Secondary | ICD-10-CM

## 2019-04-25 DIAGNOSIS — Z86018 Personal history of other benign neoplasm: Secondary | ICD-10-CM

## 2019-04-25 DIAGNOSIS — Z9181 History of falling: Secondary | ICD-10-CM

## 2019-04-25 DIAGNOSIS — Z8711 Personal history of peptic ulcer disease: Secondary | ICD-10-CM

## 2019-04-25 DIAGNOSIS — K8020 Calculus of gallbladder without cholecystitis without obstruction: Secondary | ICD-10-CM

## 2019-04-25 DIAGNOSIS — Z436 Encounter for attention to other artificial openings of urinary tract: Secondary | ICD-10-CM

## 2019-04-25 DIAGNOSIS — Z452 Encounter for adjustment and management of vascular access device: Secondary | ICD-10-CM | POA: Diagnosis not present

## 2019-04-25 DIAGNOSIS — Z96612 Presence of left artificial shoulder joint: Secondary | ICD-10-CM

## 2019-04-25 DIAGNOSIS — K5730 Diverticulosis of large intestine without perforation or abscess without bleeding: Secondary | ICD-10-CM

## 2019-04-25 DIAGNOSIS — Z96653 Presence of artificial knee joint, bilateral: Secondary | ICD-10-CM

## 2019-04-25 DIAGNOSIS — Z9079 Acquired absence of other genital organ(s): Secondary | ICD-10-CM

## 2019-04-25 DIAGNOSIS — Z932 Ileostomy status: Secondary | ICD-10-CM

## 2019-04-25 DIAGNOSIS — M47816 Spondylosis without myelopathy or radiculopathy, lumbar region: Secondary | ICD-10-CM

## 2019-04-25 DIAGNOSIS — Z6829 Body mass index (BMI) 29.0-29.9, adult: Secondary | ICD-10-CM

## 2019-04-25 DIAGNOSIS — N10 Acute tubulo-interstitial nephritis: Secondary | ICD-10-CM | POA: Diagnosis not present

## 2019-04-25 DIAGNOSIS — Z8619 Personal history of other infectious and parasitic diseases: Secondary | ICD-10-CM

## 2019-04-25 DIAGNOSIS — Z79891 Long term (current) use of opiate analgesic: Secondary | ICD-10-CM

## 2019-04-25 DIAGNOSIS — M19021 Primary osteoarthritis, right elbow: Secondary | ICD-10-CM

## 2019-04-25 DIAGNOSIS — Z792 Long term (current) use of antibiotics: Secondary | ICD-10-CM

## 2019-04-25 DIAGNOSIS — M19022 Primary osteoarthritis, left elbow: Secondary | ICD-10-CM

## 2019-04-25 DIAGNOSIS — N136 Pyonephrosis: Secondary | ICD-10-CM | POA: Diagnosis not present

## 2019-04-25 DIAGNOSIS — K219 Gastro-esophageal reflux disease without esophagitis: Secondary | ICD-10-CM

## 2019-04-25 DIAGNOSIS — E7800 Pure hypercholesterolemia, unspecified: Secondary | ICD-10-CM

## 2019-04-25 DIAGNOSIS — N179 Acute kidney failure, unspecified: Secondary | ICD-10-CM

## 2019-04-25 DIAGNOSIS — C679 Malignant neoplasm of bladder, unspecified: Secondary | ICD-10-CM

## 2019-04-25 DIAGNOSIS — I119 Hypertensive heart disease without heart failure: Secondary | ICD-10-CM

## 2019-04-25 DIAGNOSIS — Z9981 Dependence on supplemental oxygen: Secondary | ICD-10-CM

## 2019-04-25 DIAGNOSIS — Z96611 Presence of right artificial shoulder joint: Secondary | ICD-10-CM

## 2019-04-25 DIAGNOSIS — Z1612 Extended spectrum beta lactamase (ESBL) resistance: Secondary | ICD-10-CM

## 2019-04-25 DIAGNOSIS — I700 Atherosclerosis of aorta: Secondary | ICD-10-CM

## 2019-04-25 DIAGNOSIS — Z972 Presence of dental prosthetic device (complete) (partial): Secondary | ICD-10-CM

## 2019-04-25 DIAGNOSIS — E669 Obesity, unspecified: Secondary | ICD-10-CM

## 2019-04-25 DIAGNOSIS — B961 Klebsiella pneumoniae [K. pneumoniae] as the cause of diseases classified elsewhere: Secondary | ICD-10-CM

## 2019-04-25 DIAGNOSIS — Z791 Long term (current) use of non-steroidal anti-inflammatories (NSAID): Secondary | ICD-10-CM

## 2019-04-25 DIAGNOSIS — Z9049 Acquired absence of other specified parts of digestive tract: Secondary | ICD-10-CM

## 2019-05-12 ENCOUNTER — Inpatient Hospital Stay (HOSPITAL_COMMUNITY)
Admission: EM | Admit: 2019-05-12 | Discharge: 2019-05-16 | DRG: 698 | Disposition: A | Discharge status: Home / Self Care | Payer: Medicare Other | Attending: Internal Medicine | Admitting: Internal Medicine

## 2019-05-12 ENCOUNTER — Inpatient Hospital Stay (HOSPITAL_COMMUNITY): Payer: Medicare Other

## 2019-05-12 ENCOUNTER — Other Ambulatory Visit: Payer: Self-pay

## 2019-05-12 ENCOUNTER — Encounter (HOSPITAL_COMMUNITY): Payer: Self-pay | Admitting: Emergency Medicine

## 2019-05-12 ENCOUNTER — Ambulatory Visit: Payer: Medicare Other | Admitting: Cardiovascular Disease

## 2019-05-12 DIAGNOSIS — M797 Fibromyalgia: Secondary | ICD-10-CM | POA: Diagnosis present

## 2019-05-12 DIAGNOSIS — I129 Hypertensive chronic kidney disease with stage 1 through stage 4 chronic kidney disease, or unspecified chronic kidney disease: Secondary | ICD-10-CM | POA: Diagnosis present

## 2019-05-12 DIAGNOSIS — G4733 Obstructive sleep apnea (adult) (pediatric): Secondary | ICD-10-CM

## 2019-05-12 DIAGNOSIS — N136 Pyonephrosis: Secondary | ICD-10-CM | POA: Diagnosis present

## 2019-05-12 DIAGNOSIS — Z87891 Personal history of nicotine dependence: Secondary | ICD-10-CM | POA: Diagnosis not present

## 2019-05-12 DIAGNOSIS — E669 Obesity, unspecified: Secondary | ICD-10-CM | POA: Diagnosis present

## 2019-05-12 DIAGNOSIS — Z1612 Extended spectrum beta lactamase (ESBL) resistance: Secondary | ICD-10-CM | POA: Diagnosis present

## 2019-05-12 DIAGNOSIS — R6510 Systemic inflammatory response syndrome (SIRS) of non-infectious origin without acute organ dysfunction: Secondary | ICD-10-CM

## 2019-05-12 DIAGNOSIS — Z8249 Family history of ischemic heart disease and other diseases of the circulatory system: Secondary | ICD-10-CM | POA: Diagnosis not present

## 2019-05-12 DIAGNOSIS — N1831 Chronic kidney disease, stage 3a: Secondary | ICD-10-CM | POA: Diagnosis not present

## 2019-05-12 DIAGNOSIS — N390 Urinary tract infection, site not specified: Secondary | ICD-10-CM

## 2019-05-12 DIAGNOSIS — C679 Malignant neoplasm of bladder, unspecified: Secondary | ICD-10-CM | POA: Diagnosis not present

## 2019-05-12 DIAGNOSIS — A419 Sepsis, unspecified organism: Secondary | ICD-10-CM | POA: Diagnosis present

## 2019-05-12 DIAGNOSIS — R6520 Severe sepsis without septic shock: Secondary | ICD-10-CM | POA: Diagnosis present

## 2019-05-12 DIAGNOSIS — Z9889 Other specified postprocedural states: Secondary | ICD-10-CM | POA: Diagnosis not present

## 2019-05-12 DIAGNOSIS — Y846 Urinary catheterization as the cause of abnormal reaction of the patient, or of later complication, without mention of misadventure at the time of the procedure: Secondary | ICD-10-CM | POA: Diagnosis present

## 2019-05-12 DIAGNOSIS — Z79899 Other long term (current) drug therapy: Secondary | ICD-10-CM

## 2019-05-12 DIAGNOSIS — Z20828 Contact with and (suspected) exposure to other viral communicable diseases: Secondary | ICD-10-CM | POA: Diagnosis present

## 2019-05-12 DIAGNOSIS — T83518A Infection and inflammatory reaction due to other urinary catheter, initial encounter: Principal | ICD-10-CM | POA: Diagnosis present

## 2019-05-12 DIAGNOSIS — Z6833 Body mass index (BMI) 33.0-33.9, adult: Secondary | ICD-10-CM | POA: Diagnosis not present

## 2019-05-12 DIAGNOSIS — N289 Disorder of kidney and ureter, unspecified: Secondary | ICD-10-CM

## 2019-05-12 DIAGNOSIS — Z9119 Patient's noncompliance with other medical treatment and regimen: Secondary | ICD-10-CM | POA: Diagnosis not present

## 2019-05-12 DIAGNOSIS — Z8711 Personal history of peptic ulcer disease: Secondary | ICD-10-CM

## 2019-05-12 DIAGNOSIS — Z9350 Unspecified cystostomy status: Secondary | ICD-10-CM

## 2019-05-12 DIAGNOSIS — I10 Essential (primary) hypertension: Secondary | ICD-10-CM | POA: Diagnosis not present

## 2019-05-12 DIAGNOSIS — K219 Gastro-esophageal reflux disease without esophagitis: Secondary | ICD-10-CM | POA: Diagnosis present

## 2019-05-12 DIAGNOSIS — B961 Klebsiella pneumoniae [K. pneumoniae] as the cause of diseases classified elsewhere: Secondary | ICD-10-CM | POA: Diagnosis present

## 2019-05-12 DIAGNOSIS — Z96653 Presence of artificial knee joint, bilateral: Secondary | ICD-10-CM | POA: Diagnosis present

## 2019-05-12 DIAGNOSIS — Z87440 Personal history of urinary (tract) infections: Secondary | ICD-10-CM

## 2019-05-12 DIAGNOSIS — N179 Acute kidney failure, unspecified: Secondary | ICD-10-CM | POA: Diagnosis present

## 2019-05-12 DIAGNOSIS — Z936 Other artificial openings of urinary tract status: Secondary | ICD-10-CM | POA: Diagnosis not present

## 2019-05-12 DIAGNOSIS — D72825 Bandemia: Secondary | ICD-10-CM | POA: Diagnosis not present

## 2019-05-12 DIAGNOSIS — D631 Anemia in chronic kidney disease: Secondary | ICD-10-CM | POA: Diagnosis present

## 2019-05-12 DIAGNOSIS — T83511A Infection and inflammatory reaction due to indwelling urethral catheter, initial encounter: Secondary | ICD-10-CM

## 2019-05-12 DIAGNOSIS — Z9079 Acquired absence of other genital organ(s): Secondary | ICD-10-CM | POA: Diagnosis not present

## 2019-05-12 DIAGNOSIS — Z906 Acquired absence of other parts of urinary tract: Secondary | ICD-10-CM | POA: Diagnosis not present

## 2019-05-12 LAB — COMPREHENSIVE METABOLIC PANEL
ALT: 19 U/L (ref 0–44)
AST: 28 U/L (ref 15–41)
Albumin: 3.8 g/dL (ref 3.5–5.0)
Alkaline Phosphatase: 66 U/L (ref 38–126)
Anion gap: 10 (ref 5–15)
BUN: 29 mg/dL — ABNORMAL HIGH (ref 8–23)
CO2: 26 mmol/L (ref 22–32)
Calcium: 9.5 mg/dL (ref 8.9–10.3)
Chloride: 99 mmol/L (ref 98–111)
Creatinine, Ser: 1.7 mg/dL — ABNORMAL HIGH (ref 0.61–1.24)
GFR calc Af Amer: 46 mL/min — ABNORMAL LOW (ref >60)
GFR calc non Af Amer: 40 mL/min — ABNORMAL LOW (ref >60)
Glucose, Bld: 147 mg/dL — ABNORMAL HIGH (ref 70–99)
Potassium: 4 mmol/L (ref 3.5–5.1)
Sodium: 135 mmol/L (ref 135–145)
Total Bilirubin: 0.9 mg/dL (ref 0.3–1.2)
Total Protein: 8.1 g/dL (ref 6.5–8.1)

## 2019-05-12 LAB — URINALYSIS, ROUTINE W REFLEX MICROSCOPIC
Bilirubin Urine: NEGATIVE
Glucose, UA: NEGATIVE mg/dL
Ketones, ur: NEGATIVE mg/dL
Nitrite: NEGATIVE
Protein, ur: 30 mg/dL — AB
RBC / HPF: >50 RBC/hpf — ABNORMAL HIGH (ref 0–5)
Specific Gravity, Urine: 1.014 (ref 1.005–1.030)
pH: 6 (ref 5.0–8.0)

## 2019-05-12 LAB — CBC WITH DIFFERENTIAL/PLATELET
Abs Immature Granulocytes: 0.08 10*3/uL — ABNORMAL HIGH (ref 0.00–0.07)
Basophils Absolute: 0 10*3/uL (ref 0.0–0.1)
Basophils Relative: 0 %
Eosinophils Absolute: 0 10*3/uL (ref 0.0–0.5)
Eosinophils Relative: 0 %
HCT: 32.9 % — ABNORMAL LOW (ref 39.0–52.0)
Hemoglobin: 10.2 g/dL — ABNORMAL LOW (ref 13.0–17.0)
Immature Granulocytes: 1 %
Lymphocytes Relative: 8 %
Lymphs Abs: 1.1 10*3/uL (ref 0.7–4.0)
MCH: 27.6 pg (ref 26.0–34.0)
MCHC: 31 g/dL (ref 30.0–36.0)
MCV: 88.9 fL (ref 80.0–100.0)
Monocytes Absolute: 1.5 10*3/uL — ABNORMAL HIGH (ref 0.1–1.0)
Monocytes Relative: 11 %
Neutro Abs: 11.3 10*3/uL — ABNORMAL HIGH (ref 1.7–7.7)
Neutrophils Relative %: 80 %
Platelets: 200 10*3/uL (ref 150–400)
RBC: 3.7 MIL/uL — ABNORMAL LOW (ref 4.22–5.81)
RDW: 16.4 % — ABNORMAL HIGH (ref 11.5–15.5)
WBC: 14 10*3/uL — ABNORMAL HIGH (ref 4.0–10.5)
nRBC: 0 % (ref 0.0–0.2)

## 2019-05-12 LAB — PROTIME-INR
INR: 1.2 (ref 0.8–1.2)
Prothrombin Time: 15.5 seconds — ABNORMAL HIGH (ref 11.4–15.2)

## 2019-05-12 LAB — APTT: aPTT: 42 seconds — ABNORMAL HIGH (ref 24–36)

## 2019-05-12 LAB — LACTIC ACID, PLASMA: Lactic Acid, Venous: 1.9 mmol/L (ref 0.5–1.9)

## 2019-05-12 MED ORDER — SODIUM CHLORIDE 0.9 % IV SOLN
1.0000 g | Freq: Once | INTRAVENOUS | Status: AC
  Administered 2019-05-12: 1 g via INTRAVENOUS
  Filled 2019-05-12: qty 1

## 2019-05-12 MED ORDER — SODIUM CHLORIDE 0.9 % IV SOLN
1.0000 g | Freq: Two times a day (BID) | INTRAVENOUS | Status: DC
  Administered 2019-05-13 – 2019-05-14 (×4): 1 g via INTRAVENOUS
  Filled 2019-05-12 (×5): qty 1

## 2019-05-12 MED ORDER — ONDANSETRON HCL 4 MG PO TABS: 4.0000 mg | ORAL_TABLET | Freq: Four times a day (QID) | ORAL | Status: DC | PRN

## 2019-05-12 MED ORDER — ACETAMINOPHEN 325 MG PO TABS
650.0000 mg | ORAL_TABLET | Freq: Once | ORAL | Status: AC | PRN
  Administered 2019-05-12: 650 mg via ORAL
  Filled 2019-05-12: qty 2

## 2019-05-12 MED ORDER — ACETAMINOPHEN 650 MG RE SUPP: 650.0000 mg | Freq: Four times a day (QID) | RECTAL | Status: DC | PRN

## 2019-05-12 MED ORDER — SODIUM CHLORIDE 0.9 % IV SOLN
INTRAVENOUS | Status: DC
  Administered 2019-05-13 – 2019-05-15 (×4): via INTRAVENOUS

## 2019-05-12 MED ORDER — HEPARIN SODIUM (PORCINE) 5000 UNIT/ML IJ SOLN
5000.0000 [IU] | Freq: Three times a day (TID) | INTRAMUSCULAR | Status: DC
  Administered 2019-05-12 – 2019-05-16 (×12): 5000 [IU] via SUBCUTANEOUS
  Filled 2019-05-12 (×12): qty 1

## 2019-05-12 MED ORDER — ONDANSETRON HCL 4 MG/2ML IJ SOLN: 4.0000 mg | Freq: Four times a day (QID) | INTRAMUSCULAR | Status: DC | PRN

## 2019-05-12 MED ORDER — ACETAMINOPHEN 325 MG PO TABS
650.0000 mg | ORAL_TABLET | Freq: Four times a day (QID) | ORAL | Status: DC | PRN
  Administered 2019-05-13 – 2019-05-15 (×2): 650 mg via ORAL
  Filled 2019-05-12 (×3): qty 2

## 2019-05-12 NOTE — Progress Notes (Signed)
A consult was received from an ED physician for meropenem per pharmacy dosing.  The patient's profile has been reviewed for ht/wt/allergies/indication/available labs.   A one time order has been placed for meropenem 1g.  Further antibiotics/pharmacy consults should be ordered by admitting physician if indicated.                       Thank you, Kara Mead 05/12/2019  6:33 PM

## 2019-05-12 NOTE — ED Triage Notes (Addendum)
Patient reports headache and fever since last night. Denies cough, SOB, chest pain, N/V/D, abdominal pain, urinary sx. A&Ox4.

## 2019-05-12 NOTE — ED Provider Notes (Addendum)
[facility] DEPT Provider Note   CSN: [id number] Arrival date & time: 05/12/19  1321     History Chief Complaint  Patient presents with  . Fever  . Headache    Billy Rasmussen is a 73 y.o. male.  HPI     73 year old male comes with a chief complaint of fever.  Patient denies any headache to me.  At the moment he has no headache and nor did he have any severe headache within the last week.  Patient states that he started having some fevers and sweats starting yesterday.  He has history of bladder cancer and is status post bladder resection with ileal conduit in place.  Patient has had recurrent UTIs since the surgery.   Past Medical History:  Diagnosis Date  . Arthritis    knees, shoulders, elbows  . Bladder cancer [facility])    urologist-  dr Junious Silk  . Diverticulosis of colon   . Fibromyalgia   . GERD (gastroesophageal reflux disease)   . History of diverticulitis of colon   . History of gastric ulcer    due to aleve  . Hypertension   . Lower urinary tract symptoms (LUTS)   . OSA (obstructive sleep apnea)    NON-COMPLIANT  CPAP  --- BUT PT USES OXYGEN AT NIGHT 2.5L VIA [place] (PT'S DECISION)  . PONV (postoperative nausea and vomiting)   . Psoriasis   . Tinnitus    right ear more, has tranmitter in right ear removable at hs  . Wears glasses   . Wears partial dentures     Patient Active Problem List   Diagnosis Date Noted  . Fever 04/09/2019  . SIRS (systemic inflammatory response syndrome) ([place]) 04/09/2019  . Sepsis due to urinary tract infection ([place]) 02/17/2019  . Abnormal renal function 01/20/2019  . Enterococcus UTI   . Urinary tract infection without hematuria   . Acute pyelonephritis   . Gastroesophageal reflux disease   . Sepsis ([place]) 12/22/2018  . AKI (acute kidney injury) ([place]) 12/22/2018  . Bladder cancer ([place]) 11/09/2018  . 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 Acute cystitis with hematuria 10/02/2016  . Pure  hypercholesterolemia 10/02/2016  . Malignant neoplasm of urinary bladder ([place]) 10/02/2016  . Lipoma 09/30/2016  . Obese 03/31/2016  . S/P right TKA 03/30/2016  . S/P knee replacement 03/30/2016  . HTN (hypertension) 02/25/2016  . Healthcare maintenance 02/25/2016  . Obesity (BMI 30-39.9) 01/07/2016  . S/P shoulder replacement 07/20/2013  . Shoulder arthritis 04/07/2013  . CELLULITIS, KNEE, LEFT 08/26/2006    Past Surgical History:  Procedure Laterality Date  . APPENDECTOMY    . COLECTOMY W/ COLOSTOMY  1996   W/   APPENDECTOMY  . COLONOSCOPY N/A 05/11/2018   Procedure: COLONOSCOPY;  Surgeon: Daneil Dolin, MD;  [facility];  Service: Endoscopy;  Laterality: N/A;  9:30  . COLOSTOMY TAKEDOWN  1996  . CYSTOSCOPY WITH BIOPSY N/A 12/05/2013   Procedure: CYSTO BLADDER BIOPSY AND FULGERATION;  Surgeon: Festus Aloe, MD;  Location[facility];  Service: Urology;  Laterality: N/A;  . CYSTOSCOPY WITH BIOPSY Bilateral 11/13/2014   Procedure: CYSTOSCOPY WITH  BLADDER BIOPSY FULGERATION AND BILATERAL RETROGRADE PYELOGRAMS;  Surgeon: Festus Aloe, MD;  Location: [facility];  Service: Urology;  Laterality: Bilateral;  . CYSTOSCOPY WITH FULGERATION N/A 01/18/2018   Procedure: CYSTOSCOPY WITH FULGERATION/ BLADDER BIOPSY;  Surgeon: Festus Aloe, MD;  Location: [facility];  Service: Urology;  Laterality: N/A;  .  CYSTOSCOPY WITH INJECTION N/A 11/09/2018   Procedure: CYSTOSCOPY WITH INJECTION OF INDOCYANINE GREEN DYE;  Surgeon: Alexis Frock, MD;  Location: [facility];  Service: Urology;  Laterality: N/A;  . CYSTOSCOPY WITH INSERTION OF UROLIFT N/A 01/18/2018   Procedure: CYSTOSCOPY WITH INSERTION OF UROLIFT;  Surgeon: Festus Aloe, MD;  Location[facility];  Service: Urology;  Laterality: N/A;  . CYSTOSCOPY/URETEROSCOPY/HOLMIUM LASER Left 02/17/2019   Procedure: URETEROSCOPY WITH BALLOON DILATION  AND NEPHROSTOGRAM;   Surgeon: Alexis Frock, MD;  Location: [facility];  Service: Urology;  Laterality: Left;  1 HR  . EXCISION RIGHT UPPER ARM LIPOMA  2005  . HEMORROIDECTOMY    . INGUINAL HERNIA REPAIR Left 1984  . IR NEPHROSTOMY PLACEMENT LEFT  01/05/2019  . KNEE ARTHROSCOPY Left X3  LAST ONE  2002  . ORIF LEFT HUMEROUS FX  1976  . POLYPECTOMY  05/11/2018   Procedure: POLYPECTOMY;  Surgeon: Daneil Dolin, MD;  [facility];  Service: Endoscopy;;  . PROSTATE SURGERY    . TONSILLECTOMY  AS CHILD  . TOTAL KNEE ARTHROPLASTY Left 2006   REVISION 2007  (AFTER I & D WITH ANTIBIOTIC SPACER PROCEDURE FOR STEPH INFECTION)  . TOTAL KNEE ARTHROPLASTY Right 03/30/2016   Procedure: RIGHT TOTAL KNEE ARTHROPLASTY;  Surgeon: Paralee Cancel, MD;  Location: [facility];  Service: Orthopedics;  Laterality: Right;  . TOTAL SHOULDER ARTHROPLASTY Right 04/06/2013   Procedure: RIGHT TOTAL SHOULDER ARTHROPLASTY;  Surgeon: Marin Shutter, MD;  Location: [place];  Service: Orthopedics;  Laterality: Right;  . TOTAL SHOULDER ARTHROPLASTY Left 07/20/2013   Procedure: LEFT TOTAL SHOULDER ARTHROPLASTY;  Surgeon: Marin Shutter, MD;  Location: [place];  Service: Orthopedics;  Laterality: Left;  . TRANSURETHRAL RESECTION OF BLADDER TUMOR N/A 11/12/2015   Procedure: TRANSURETHRAL RESECTION OF BLADDER TUMOR (TURBT);  Surgeon: Festus Aloe, MD;  Location[facility];  Service: Urology;  Laterality: N/A;  . TRANSURETHRAL RESECTION OF BLADDER TUMOR WITH GYRUS (TURBT-GYRUS) N/A 12/05/2013   Procedure: TRANSURETHRAL RESECTION OF BLADDER TUMOR WITH GYRUS (TURBT-GYRUS);  Surgeon: Festus Aloe, MD;  Location: [facility];  Service: Urology;  Laterality: N/A;       Family History  Problem Relation Age of Onset  . Alzheimer's disease Mother   . Heart attack Father   . Heart disease Father   . Irregular heart beat Brother        DEFIB.  . Congestive Heart Failure Brother   . Diabetes Daughter       Social History   Tobacco Use  . Smoking status: Former Smoker    Packs/day: 2.00    Years: 40.00    Pack years: 80.00    Types: Cigarettes    Quit date: 06/01/1997    Years since quitting: 21.9  . Smokeless tobacco: Never Used  Substance Use Topics  . Alcohol use: No  . Drug use: No    Home Medications Prior to Admission medications   Medication Sig Start Date End Date Taking? Authorizing Provider  diclofenac (VOLTAREN) 75 MG EC tablet NEW PRESCRIPTION REQUEST: DICLOFENAC SODIUM 75MG  - TAKE ONE TABLET BY MOUTH TWICE DAILY Patient taking differently: Take 75 mg by mouth 2 (two) times daily.  04/12/19  Yes Terald Sleeper, PA-C  docusate sodium (COLACE) 100 MG capsule Take 200 mg by mouth daily.   Yes [provider]  feeding supplement, ENSURE ENLIVE, (ENSURE ENLIVE) LIQD Take 237 mLs by mouth 2 (two) times daily between meals. Patient  taking differently: Take 237 mLs by mouth 2 (two) times daily as needed (nutritional supplement).  03/21/19  Yes Dhungel, Nishant, MD  lisinopril-hydrochlorothiazide (ZESTORETIC) 20-12.5 MG tablet NEW PRESCRIPTION REQUEST: LISINOPRIL 20MG -HYDROCHLOROTHIAZIDE 12.5MG  - TAKE ONE TABLET BY MOUTH DAILY Patient taking differently: Take 1 tablet by mouth daily.  04/12/19  Yes Terald Sleeper, PA-C  Multiple Vitamin (MULTIVITAMIN WITH MINERALS) TABS tablet Take 1 tablet by mouth daily.   Yes [provider]  meclizine (ANTIVERT) 25 MG tablet NEW PRESCRIPTION REQUEST: MECLIZINE 25MG  - TAKE ONE TABLET BY MOUTH THREE TIMES DAILY Patient not taking: Reported on 05/12/2019 04/12/19   Terald Sleeper, PA-C    Allergies    Patient has no known allergies.  Review of Systems   Review of Systems  Constitutional: Positive for activity change, diaphoresis and fever.  Respiratory: Negative for cough and shortness of breath.   Cardiovascular: Negative for chest pain.  Gastrointestinal: Negative for nausea and vomiting.  Allergic/Immunologic:  Negative for immunocompromised state.  Hematological: Does not bruise/bleed easily.  All other systems reviewed and are negative.   Physical Exam Updated Vital Signs BP 106/68   Pulse 67   Temp 98 F (36.7 C) (Oral)   Resp 19   Ht 5\' 6"  (1.676 m)   Wt 93.9 kg   SpO2 98%   BMI 33.41 kg/m   Physical Exam Vitals and nursing note reviewed.  Constitutional:      Appearance: He is well-developed.  HENT:     Head: Atraumatic.  Cardiovascular:     Rate and Rhythm: Normal rate.  Pulmonary:     Effort: Pulmonary effort is normal.  Abdominal:     Comments: Urinary bag has straw-colored urine.  Musculoskeletal:     Cervical back: Neck supple.  Skin:    General: Skin is warm.  Neurological:     Mental Status: He is alert and oriented to person, place, and time.     ED Results / Procedures / Treatments   Labs (all labs ordered are listed, but only abnormal results are displayed) Labs Reviewed  COMPREHENSIVE METABOLIC PANEL - Abnormal; Notable for the following components:      Result Value   Glucose, Bld 147 (*)    BUN 29 (*)    Creatinine, Ser 1.70 (*)    GFR calc non Af Amer 40 (*)    GFR calc Af Amer 46 (*)    All other components within normal limits  CBC WITH DIFFERENTIAL/PLATELET - Abnormal; Notable for the following components:   WBC 14.0 (*)    RBC 3.70 (*)    Hemoglobin 10.2 (*)    HCT 32.9 (*)    RDW 16.4 (*)    Neutro Abs 11.3 (*)    Monocytes Absolute 1.5 (*)    Abs Immature Granulocytes 0.08 (*)    All other components within normal limits  APTT - Abnormal; Notable for the following components:   aPTT 42 (*)    All other components within normal limits  PROTIME-INR - Abnormal; Notable for the following components:   Prothrombin Time 15.5 (*)    All other components within normal limits  URINALYSIS, ROUTINE W REFLEX MICROSCOPIC - Abnormal; Notable for the following components:   Color, Urine AMBER (*)    APPearance CLOUDY (*)    Hgb urine dipstick  MODERATE (*)    Protein, ur 30 (*)    Leukocytes,Ua LARGE (*)    RBC / HPF >50 (*)    Bacteria, UA RARE (*)  All other components within normal limits  CULTURE, BLOOD (ROUTINE X 2)  CULTURE, BLOOD (ROUTINE X 2)  URINE CULTURE  SARS CORONAVIRUS 2 (TAT 6-24 HRS)  LACTIC ACID, PLASMA    EKG EKG Interpretation  Date/Time:  Friday May 12 2019 19:21:57 EST Ventricular Rate:  69 PR Interval:    QRS Duration: 111 QT Interval:  421 QTC Calculation: 451 R Axis:   -40 Text Interpretation: Sinus rhythm Prolonged PR interval Left anterior fascicular block Low voltage, precordial leads Abnormal R-wave progression, early transition Consider anterior infarct No acute changes Confirmed by Varney Biles [phone]) on 05/12/2019 7:52:50 PM   Radiology No results found.  Procedures Procedures (including critical care time)  Medications Ordered in ED Medications  acetaminophen (TYLENOL) tablet 650 mg (650 mg Oral Given 05/12/19 1407)  meropenem (MERREM) 1 g in sodium chloride 0.9 % 100 mL IVPB (0 g Intravenous Stopped 05/12/19 1933)    ED Course  I have reviewed the triage vital signs and the nursing notes.  Pertinent labs & imaging results that were available during my care of the patient were reviewed by me and considered in my medical decision making (see chart for details).  Clinical Course as of May 11 2020  Fri May 12, 2019  2019 + for infection. Admit.   Urinalysis, Routine w reflex microscopic(!) [AN]    Clinical Course User Index [AN] Varney Biles, MD   MDM Rules/Calculators/A&P                       73 year old with history of bladder cancer status post ileal conduit placement and recurrent UTIs since the procedure comes in with chief complaint of fevers, sweats, chills.  He is having no abdominal pain.  Patient is noted to have sirs criteria at arrival. Clinically he is likely to have a UTI again leading to the symptoms.  He will need blood cultures and  sepsis work-up given that he is having sweats and there is a possibility of bacteremia.  Urine cultures from prior visit reviewed.  Patient had drug-resistant Klebsiella pneumonia in the past.  We will give him meropenem.  Final Clinical Impression(s) / ED Diagnoses Final diagnoses:  Urinary tract infection associated with catheterization of urinary tract, unspecified indwelling urinary catheter type, initial encounter ([place])  SIRS (systemic inflammatory response syndrome) ([place])    Rx / DC Orders ED Discharge Orders    None       Varney Biles, MD 05/12/19 2019    Varney Biles, MD 05/12/19 2021

## 2019-05-12 NOTE — ED Notes (Addendum)
Attempted to call report to room [place] unable take at this time

## 2019-05-12 NOTE — H&P (Signed)
History and Physical    Billy Rasmussen [id number] DOB: Jun 17, 1945 DOA: 05/12/2019  PCP: Terald Sleeper, PA-C  Patient coming from: Home  I have personally briefly reviewed patient's old medical records in [place]  Chief Complaint: Fevers, chills, diaphoresis  HPI: Billy Rasmussen is a 73 y.o. male with medical history significant for bladder cancer s/p cystoprostatectomy and conduit diversion with cystostomy in place, recurrent UTI/pyelonephritis, hypertension, GERD, and OSA who presents to the ED for evaluation of fevers, chills, and diaphoresis.  Patient has history of recurrent UTI/pyelonephritis requiring multiple admissions for management.  He has had previous ESBL Klebsiella on urine culture from 03/16/2019 and blood culture from 01/02/2019 grew Pseudomonas.  He has a history of left ureteral stricture previously requiring a stent which has since been removed.  Patient was last admitted from 04/09/2019-04/13/2019 for urosepsis and AKI.  He was treated with antibiotics and discharged on a course of V[place].  He was seen by urology who recommended outpatient follow-up and consideration of further intervention if he has continued recurrent urinary infections.  Patient states he has been doing well since discharge from the hospital until yesterday when he had sudden onset of fevers, chills, diaphoresis, and generalized malaise/fatigue.  He has not had any abdominal pain.  He has a cystostomy in place and has noticed a change in his urine output.  He otherwise denies any chest pain, dyspnea, cough.  ED Course:  Initial vitals showed BP 130/97, pulse 119, RR 19, temp 101.9 Fahrenheit, SPO2 98% on room air.  Labs notable for WBC 14.0, hemoglobin 10.2, platelets 200,000, sodium 135, potassium 4.0, bicarb 26, BUN 29, creatinine 1.7, lactic acid 1.9.  Urinalysis showed negative nitrites, large leukocytes, > 50 RBCs, 21-50 WBCs, rare bacteria on microscopy.  Blood and urine cultures  were obtained and pending.  SARS-CoV-2 PCR test is ordered and pending.  Patient was given IV meropenem in the hospital service was consulted admit for further evaluation management.  Review of Systems: All systems reviewed and are negative except as documented in history of present illness above.   Past Medical History:  Diagnosis Date  . Arthritis    knees, shoulders, elbows  . Bladder cancer [facility])    urologist-  dr Junious Silk  . Diverticulosis of colon   . Fibromyalgia   . GERD (gastroesophageal reflux disease)   . History of diverticulitis of colon   . History of gastric ulcer    due to aleve  . Hypertension   . Lower urinary tract symptoms (LUTS)   . OSA (obstructive sleep apnea)    NON-COMPLIANT  CPAP  --- BUT PT USES OXYGEN AT NIGHT 2.5L VIA [place] (PT'S DECISION)  . PONV (postoperative nausea and vomiting)   . Psoriasis   . Tinnitus    right ear more, has tranmitter in right ear removable at hs  . Wears glasses   . Wears partial dentures     Past Surgical History:  Procedure Laterality Date  . APPENDECTOMY    . COLECTOMY W/ COLOSTOMY  1996   W/   APPENDECTOMY  . COLONOSCOPY N/A 05/11/2018   Procedure: COLONOSCOPY;  Surgeon: Daneil Dolin, MD;  [facility];  Service: Endoscopy;  Laterality: N/A;  9:30  . COLOSTOMY TAKEDOWN  1996  . CYSTOSCOPY WITH BIOPSY N/A 12/05/2013   Procedure: CYSTO BLADDER BIOPSY AND FULGERATION;  Surgeon: Festus Aloe, MD;  Location: [facility];  Service: Urology;  Laterality: N/A;  . CYSTOSCOPY WITH BIOPSY  Bilateral 11/13/2014   Procedure: CYSTOSCOPY WITH  BLADDER BIOPSY FULGERATION AND BILATERAL RETROGRADE PYELOGRAMS;  Surgeon: Festus Aloe, MD;  Location: [facility];  Service: Urology;  Laterality: Bilateral;  . CYSTOSCOPY WITH FULGERATION N/A 01/18/2018   Procedure: CYSTOSCOPY WITH FULGERATION/ BLADDER BIOPSY;  Surgeon: Festus Aloe, MD;  Location: [facility];  Service:  Urology;  Laterality: N/A;  . CYSTOSCOPY WITH INJECTION N/A 11/09/2018   Procedure: CYSTOSCOPY WITH INJECTION OF INDOCYANINE GREEN DYE;  Surgeon: Alexis Frock, MD;  [facility];  Service: Urology;  Laterality: N/A;  . CYSTOSCOPY WITH INSERTION OF UROLIFT N/A 01/18/2018   Procedure: CYSTOSCOPY WITH INSERTION OF UROLIFT;  Surgeon: Festus Aloe, MD;  Location: [facility];  Service: Urology;  Laterality: N/A;  . CYSTOSCOPY/URETEROSCOPY/HOLMIUM LASER Left 02/17/2019   Procedure: URETEROSCOPY WITH BALLOON DILATION  AND NEPHROSTOGRAM;  Surgeon: Alexis Frock, MD;  Location: [facility];  Service: Urology;  Laterality: Left;  1 HR  . EXCISION RIGHT UPPER ARM LIPOMA  2005  . HEMORROIDECTOMY    . INGUINAL HERNIA REPAIR Left 1984  . IR NEPHROSTOMY PLACEMENT LEFT  01/05/2019  . KNEE ARTHROSCOPY Left X3  LAST ONE  2002  . ORIF LEFT HUMEROUS FX  1976  . POLYPECTOMY  05/11/2018   Procedure: POLYPECTOMY;  Surgeon: Daneil Dolin, MD;  [facility];  Service: Endoscopy;;  . PROSTATE SURGERY    . TONSILLECTOMY  AS CHILD  . TOTAL KNEE ARTHROPLASTY Left 2006   REVISION 2007  (AFTER I & D WITH ANTIBIOTIC SPACER PROCEDURE FOR STEPH INFECTION)  . TOTAL KNEE ARTHROPLASTY Right 03/30/2016   Procedure: RIGHT TOTAL KNEE ARTHROPLASTY;  Surgeon: Paralee Cancel, MD;  [facility];  Service: Orthopedics;  Laterality: Right;  . TOTAL SHOULDER ARTHROPLASTY Right 04/06/2013   Procedure: RIGHT TOTAL SHOULDER ARTHROPLASTY;  Surgeon: Marin Shutter, MD;  Location: [facility];  Service: Orthopedics;  Laterality: Right;  . TOTAL SHOULDER ARTHROPLASTY Left 07/20/2013   Procedure: LEFT TOTAL SHOULDER ARTHROPLASTY;  Surgeon: Marin Shutter, MD;  Location: [facility];  Service: Orthopedics;  Laterality: Left;  . TRANSURETHRAL RESECTION OF BLADDER TUMOR N/A 11/12/2015   Procedure: TRANSURETHRAL RESECTION OF BLADDER TUMOR (TURBT);  Surgeon: Festus Aloe, MD;  Location: [facility];  Service: Urology;  Laterality: N/A;  . TRANSURETHRAL RESECTION OF BLADDER TUMOR WITH GYRUS (TURBT-GYRUS) N/A 12/05/2013   Procedure: TRANSURETHRAL RESECTION OF BLADDER TUMOR WITH GYRUS (TURBT-GYRUS);  Surgeon: Festus Aloe, MD;  Location: [facility];  Service: Urology;  Laterality: N/A;    Social History:  reports that he quit smoking about 21 years ago. His smoking use included cigarettes. He has a 80.00 pack-year smoking history. He has never used smokeless tobacco. He reports that he does not drink alcohol or use drugs.  No Known Allergies  Family History  Problem Relation Age of Onset  . Alzheimer's disease Mother   . Heart attack Father   . Heart disease Father   . Irregular heart beat Brother        DEFIB.  . Congestive Heart Failure Brother   . Diabetes Daughter      Prior to Admission medications   Medication Sig Start Date End Date Taking? Authorizing Provider  diclofenac (VOLTAREN) 75 MG EC tablet NEW PRESCRIPTION REQUEST: DICLOFENAC SODIUM 75MG  - TAKE ONE TABLET BY MOUTH TWICE DAILY Patient taking differently: Take 75 mg by mouth 2 (two) times daily.  04/12/19  Yes Terald Sleeper, PA-C  docusate sodium (COLACE) 100 MG capsule Take 200 mg by mouth daily.   Yes [provider]  feeding supplement, ENSURE ENLIVE, (ENSURE ENLIVE) LIQD Take 237 mLs by mouth 2 (two) times daily between meals. Patient taking differently: Take 237 mLs by mouth 2 (two) times daily as needed (nutritional supplement).  03/21/19  Yes Dhungel, Nishant, MD  lisinopril-hydrochlorothiazide (ZESTORETIC) 20-12.5 MG tablet NEW PRESCRIPTION REQUEST: LISINOPRIL 20MG -HYDROCHLOROTHIAZIDE 12.5MG  - TAKE ONE TABLET BY MOUTH DAILY Patient taking differently: Take 1 tablet by mouth daily.  04/12/19  Yes Terald Sleeper, PA-C  Multiple Vitamin (MULTIVITAMIN WITH MINERALS) TABS tablet Take 1 tablet by mouth daily.   Yes [provider]  meclizine (ANTIVERT) 25 MG tablet NEW  PRESCRIPTION REQUEST: MECLIZINE 25MG  - TAKE ONE TABLET BY MOUTH THREE TIMES DAILY Patient not taking: Reported on 05/12/2019 04/12/19   Terald Sleeper, PA-C    Physical Exam: Vitals:   05/12/19 1807 05/12/19 1900 05/12/19 1930 05/12/19 2000  BP: 115/69 104/60 121/60 106/68  Pulse: 85 73 68 67  Resp: 18 14 (!) 24 19  Temp: 98 F (36.7 C)     TempSrc: Oral     SpO2: 99% 99% 100% 98%  Weight: 93.9 kg     Height: 5\' 6"  (1.676 m)       Constitutional: Resting supine in bed, NAD, calm, appears tired but comfortable Eyes: PERRL, lids and conjunctivae normal ENMT: Mucous membranes are dry. Posterior pharynx clear of any exudate or lesions.Normal dentition.  Neck: normal, supple, no masses. Respiratory: clear to auscultation bilaterally, no wheezing, no crackles. Normal respiratory effort. No accessory muscle use.  Cardiovascular: Regular rate and rhythm, no murmurs / rubs / gallops. No extremity edema. 2+ pedal pulses. Abdomen: Cystostomy in place with amber-colored urine in bag, no tenderness, no masses palpated. No hepatosplenomegaly. Bowel sounds positive.  Musculoskeletal: no clubbing / cyanosis. No joint deformity upper and lower extremities. Good ROM, no contractures. Normal muscle tone.  Skin: no rashes, lesions, ulcers. No induration Neurologic: CN 2-12 grossly intact. Sensation intact, Strength 5/5 in all 4.  Psychiatric: Normal judgment and insight. Alert and oriented x 3. Normal mood.     Labs on Admission: I have personally reviewed following labs and imaging studies  CBC: Recent Labs  Lab 05/12/19 1851  WBC 14.0*  NEUTROABS 11.3*  HGB 10.2*  HCT 32.9*  MCV 88.9  PLT [id number]   Basic Metabolic Panel: Recent Labs  Lab 05/12/19 1851  NA 135  K 4.0  CL 99  CO2 26  GLUCOSE 147*  BUN 29*  CREATININE 1.70*  CALCIUM 9.5   GFR: Estimated Creatinine Clearance: 42.7 mL/min (A) (by C-G formula based on SCr of 1.7 mg/dL (H)). Liver Function Tests: Recent Labs  Lab  05/12/19 1851  AST 28  ALT 19  ALKPHOS 66  BILITOT 0.9  PROT 8.1  ALBUMIN 3.8   No results for input(s): LIPASE, AMYLASE in the last 168 hours. No results for input(s): AMMONIA in the last 168 hours. Coagulation Profile: Recent Labs  Lab 05/12/19 1851  INR 1.2   Cardiac Enzymes: No results for input(s): CKTOTAL, CKMB, CKMBINDEX, TROPONINI in the last 168 hours. BNP (last 3 results) No results for input(s): PROBNP in the last 8760 hours. HbA1C: No results for input(s): HGBA1C in the last 72 hours. CBG: No results for input(s): GLUCAP in the last 168 hours. Lipid Profile: No results for input(s): CHOL, HDL, LDLCALC, TRIG, CHOLHDL, LDLDIRECT in the last 72 hours. Thyroid Function Tests: No  results for input(s): TSH, T4TOTAL, FREET4, T3FREE, THYROIDAB in the last 72 hours. Anemia Panel: No results for input(s): VITAMINB12, FOLATE, FERRITIN, TIBC, IRON, RETICCTPCT in the last 72 hours. Urine analysis:    Component Value Date/Time   COLORURINE AMBER (A) 05/12/2019 1851   APPEARANCEUR CLOUDY (A) 05/12/2019 1851   APPEARANCEUR Cloudy (A) 01/27/2018 1625   LABSPEC 1.014 05/12/2019 1851   PHURINE 6.0 05/12/2019 1851   GLUCOSEU NEGATIVE 05/12/2019 1851   HGBUR MODERATE (A) 05/12/2019 1851   BILIRUBINUR NEGATIVE 05/12/2019 1851   BILIRUBINUR Negative 01/27/2018 [place] 05/12/2019 1851   PROTEINUR 30 (A) 05/12/2019 1851   NITRITE NEGATIVE 05/12/2019 1851   LEUKOCYTESUR LARGE (A) 05/12/2019 1851    Radiological Exams on Admission: No results found.  EKG: Independently reviewed. Sinus rhythm, LAFB, no acute ischemic changes.  Assessment/Plan Principal Problem:   Sepsis due to urinary tract infection (HCC) Active Problems:   HTN (hypertension)   Bladder cancer (HCC)   AKI (acute kidney injury) ([place])   OSA (obstructive sleep apnea)  Billy Rasmussen is a 73 y.o. male with medical history significant for bladder cancer s/p cystoprostatectomy and conduit  diversion with cystostomy in place, recurrent UTI/pyelonephritis, hypertension, GERD, and OSA who is admitted with sepsis due to UTI.  Sepsis secondary to UTI: Patient with fever, tachycardia, leukocytosis and abnormal urinalysis with history of recurrent UTI/pyelonephritis including history of ESBL Klebsiella UTI and Pseudomonas bacteremia.  Will admit for continued IV antibiotics and fluid resuscitation. -Continue meropenem -Continue IV fluid resuscitation -Check renal ultrasound -May need further urology input due to recurrent UTI  Acute kidney injury due to sepsis: And lisinopril-HCTZ use.  Hold home antihypertensives, continue IV fluid resuscitation and management as above.  Recurrent bladder cancer s/p cystoprostatectomy and conduit diversion: Following with urology, check renal ultrasound as above.  Cystoscopy in place.  Hypertension: Holding home lisinopril-HCTZ in setting of AKI.  OSA: Uses nightly supplemental O2 2.5 L via [place].  We will continue in hospital.  DVT prophylaxis: Subcutaneous heparin Code Status: Full code, confirmed with patient Family Communication: Discussed with patient, he has discussed with family Disposition Plan: Pending clinical progress Consults called: None Admission status: Inpatient for management of sepsis due to complicated UTI associated with AKI and history of drug resistant bacterial infection.   Zada Finders MD [facility]  If 7PM-7AM, please contact night-coverage [email or web address]  05/12/2019, 9:14 PM

## 2019-05-12 NOTE — ED Notes (Signed)
ED TO INPATIENT HANDOFF REPORT  Name/Age/Gender Billy Rasmussen 73 y.o. male  Code Status    Code Status Orders  (From admission, onward)         Start     Ordered   05/12/19 2108  Full code  Continuous     05/12/19 2108        Code Status History    Date Active Date Inactive Code Status Order ID Comments User Context   04/09/2019 2135 04/13/2019 1554 Full Code [id number]  Rise Patience, MD ED   03/16/2019 2327 03/21/2019 1934 Full Code [id number]  Rise Patience, MD ED   02/17/2019 1545 02/20/2019 1635 Full Code [id number]  Alexis Frock, MD Inpatient   01/02/2019 1759 01/06/2019 2200 Full Code [id number]  Mercy Riding, MD ED   12/22/2018 1750 12/25/2018 2007 Full Code [id number]  Barb Merino, MD ED   11/09/2018 1624 11/16/2018 1718 Full Code [id number]  Lattie Corns Inpatient   03/30/2016 1933 03/31/2016 1636 Full Code [id number]  Norman Herrlich Inpatient   07/20/2013 1410 07/21/2013 1329 Full Code [id number]  Marcellus Scott Inpatient   04/06/2013 1518 04/07/2013 1359 Full Code [id number]  Shuford, Olivia Mackie, PA-C Inpatient   Advance Care Planning Activity      Home/SNF/Other Home  Chief Complaint Sepsis due to urinary tract infection ([place]) [A41.9, N39.0]  Level of Care/Admitting Diagnosis ED Disposition    ED Disposition Condition [place]: [place] [[id number]]  Level of Care: Med-Surg [16]  Covid Evaluation: Symptomatic Person Under Investigation (PUI)  Diagnosis: Sepsis due to urinary tract infection [facility]) [id number]  Admitting Physician: Lenore Cordia [id number]  Attending Physician: Lenore Cordia [id number]  Estimated length of stay: past midnight tomorrow  Certification:: I certify this patient will need inpatient services for at least 2 midnights       Medical History Past Medical History:  Diagnosis Date  . Arthritis    knees, shoulders, elbows  . Bladder cancer [facility])    urologist-  dr  Junious Silk  . Diverticulosis of colon   . Fibromyalgia   . GERD (gastroesophageal reflux disease)   . History of diverticulitis of colon   . History of gastric ulcer    due to aleve  . Hypertension   . Lower urinary tract symptoms (LUTS)   . OSA (obstructive sleep apnea)    NON-COMPLIANT  CPAP  --- BUT PT USES OXYGEN AT NIGHT 2.5L VIA [place] (PT'S DECISION)  . PONV (postoperative nausea and vomiting)   . Psoriasis   . Tinnitus    right ear more, has tranmitter in right ear removable at hs  . Wears glasses   . Wears partial dentures     Allergies No Known Allergies  IV Location/Drains/Wounds Patient Lines/Drains/Airways Status   Active Line/Drains/Airways    Name:   Placement date:   Placement time:   Site:   Days:   Peripheral IV 04/09/19 Left Antecubital   04/09/19    1740    Antecubital   33   Nephrostomy Left 10.2 Fr.   01/05/19    1729    Left   127   Urostomy Ileal conduit RUQ   11/09/18    1437    RUQ   184   Ureteral Drain/Stent Left ureter 7 Fr.   11/09/18    1338    Left ureter   184   Ureteral Drain/Stent Right ureter 7 Fr.   11/09/18  1400    Right ureter   184   Incision (Closed) 01/18/18 Perineum   01/18/18    0825     479   Incision (Closed) 11/09/18 Abdomen   11/09/18    1003     184   Incision (Closed) 02/17/19 Back   02/17/19    0955     84   Incision - 4 Ports Abdomen 1: Right;Lateral 2: Right;Medial 3: Left;Medial 4: Left;Lateral   11/09/18    0900     184          Labs/Imaging Results for orders placed or performed during the hospital encounter of 05/12/19 (from the past 48 hour(s))  Lactic acid, plasma     Status: None   Collection Time: 05/12/19  6:51 PM  Result Value Ref Range   Lactic Acid, Venous 1.9 0.5 - 1.9 mmol/L    Comment: Performed at [facility], [place] [address] [place], [place] [address]  Comprehensive metabolic panel     Status: Abnormal   Collection Time: 05/12/19  6:51 PM  Result Value Ref Range   Sodium 135 135 -  145 mmol/L   Potassium 4.0 3.5 - 5.1 mmol/L   Chloride 99 98 - 111 mmol/L   CO2 26 22 - 32 mmol/L   Glucose, Bld 147 (H) 70 - 99 mg/dL   BUN 29 (H) 8 - 23 mg/dL   Creatinine, Ser 1.70 (H) 0.61 - 1.24 mg/dL   Calcium 9.5 8.9 - 10.3 mg/dL   Total Protein 8.1 6.5 - 8.1 g/dL   Albumin 3.8 3.5 - 5.0 g/dL   AST 28 15 - 41 U/L   ALT 19 0 - 44 U/L   Alkaline Phosphatase 66 38 - 126 U/L   Total Bilirubin 0.9 0.3 - 1.2 mg/dL   GFR calc non Af Amer 40 (L) >60 mL/min   GFR calc Af Amer 46 (L) >60 mL/min   Anion gap 10 5 - 15    Comment: Performed at [facility], [place] [address] [place], [place] [address]  CBC WITH DIFFERENTIAL     Status: Abnormal   Collection Time: 05/12/19  6:51 PM  Result Value Ref Range   WBC 14.0 (H) 4.0 - 10.5 K/uL   RBC 3.70 (L) 4.22 - 5.81 MIL/uL   Hemoglobin 10.2 (L) 13.0 - 17.0 g/dL   HCT 32.9 (L) 39.0 - 52.0 %   MCV 88.9 80.0 - 100.0 fL   MCH 27.6 26.0 - 34.0 pg   MCHC 31.0 30.0 - 36.0 g/dL   RDW 16.4 (H) 11.5 - 15.5 %   Platelets 200 150 - 400 K/uL   nRBC 0.0 0.0 - 0.2 %   Neutrophils Relative % 80 %   Neutro Abs 11.3 (H) 1.7 - 7.7 K/uL   Lymphocytes Relative 8 %   Lymphs Abs 1.1 0.7 - 4.0 K/uL   Monocytes Relative 11 %   Monocytes Absolute 1.5 (H) 0.1 - 1.0 K/uL   Eosinophils Relative 0 %   Eosinophils Absolute 0.0 0.0 - 0.5 K/uL   Basophils Relative 0 %   Basophils Absolute 0.0 0.0 - 0.1 K/uL   Immature Granulocytes 1 %   Abs Immature Granulocytes 0.08 (H) 0.00 - 0.07 K/uL    Comment: Performed at [facility], [place] [address], [place], [place] [address]  APTT     Status: Abnormal   Collection Time: 05/12/19  6:51 PM  Result Value Ref Range  aPTT 42 (H) 24 - 36 seconds    Comment:        IF BASELINE aPTT IS ELEVATED, SUGGEST PATIENT RISK ASSESSMENT BE USED TO DETERMINE APPROPRIATE ANTICOAGULANT THERAPY. Performed at [facility], [place] [address] [place], [place] [address]   Protime-INR      Status: Abnormal   Collection Time: 05/12/19  6:51 PM  Result Value Ref Range   Prothrombin Time 15.5 (H) 11.4 - 15.2 seconds   INR 1.2 0.8 - 1.2    Comment: (NOTE) INR goal varies based on device and disease states. Performed at [facility], [place] [address], [place], [place] [address]   Urinalysis, Routine w reflex microscopic     Status: Abnormal   Collection Time: 05/12/19  6:51 PM  Result Value Ref Range   Color, Urine AMBER (A) YELLOW    Comment: BIOCHEMICALS MAY BE AFFECTED BY COLOR   APPearance CLOUDY (A) CLEAR   Specific Gravity, Urine 1.014 1.005 - 1.030   pH 6.0 5.0 - 8.0   Glucose, UA NEGATIVE NEGATIVE mg/dL   Hgb urine dipstick MODERATE (A) NEGATIVE   Bilirubin Urine NEGATIVE NEGATIVE   Ketones, ur NEGATIVE NEGATIVE mg/dL   Protein, ur 30 (A) NEGATIVE mg/dL   Nitrite NEGATIVE NEGATIVE   Leukocytes,Ua LARGE (A) NEGATIVE   RBC / HPF >50 (H) 0 - 5 RBC/hpf   WBC, UA 21-50 0 - 5 WBC/hpf   Bacteria, UA RARE (A) NONE SEEN   Squamous Epithelial / LPF 0-5 0 - 5   Mucus PRESENT     Comment: Performed at [facility], [place] [address] [place], [place] [address]   No results found.  Pending Labs Unresulted Labs (From admission, onward)    Start     Ordered   05/13/19 0500  CBC  Tomorrow morning,   R     05/12/19 2108   05/13/19 [id number]  Basic metabolic panel  Tomorrow morning,   R     05/12/19 2108   05/12/19 2009  SARS CORONAVIRUS 2 (TAT 6-24 HRS) Nasopharyngeal Nasopharyngeal Swab  (Tier 3 (TAT 6-24 hrs))  Once,   STAT    Question Answer Comment  Is this test for diagnosis or screening Screening   Symptomatic for COVID-19 as defined by CDC No   Hospitalized for COVID-19 No   Admitted to ICU for COVID-19 No   Previously tested for COVID-19 No   Resident in a congregate (group) care setting No   Employed in healthcare setting No      05/12/19 2008   05/12/19 1830  Blood Culture (routine x 2)  BLOOD CULTURE X 2,   STAT      05/12/19 1830   05/12/19 1830  Urine culture  ONCE - STAT,   STAT     05/12/19 1830          Vitals/Pain Today's Vitals   05/12/19 2200 05/12/19 2207 05/12/19 2209 05/12/19 2211  BP: (!) 117/54     Pulse: 64     Resp: (!) 22     Temp:      TempSrc:      SpO2: 100%     Weight:      Height:      PainSc:  2  0-No pain 0-No pain    Isolation Precautions No active isolations  Medications Medications  heparin injection 5,000 Units (has no administration in time range)  0.9 %  sodium chloride infusion ( Intravenous Transfusing/Transfer 05/12/19  2210)  acetaminophen (TYLENOL) tablet 650 mg (has no administration in time range)    Or  acetaminophen (TYLENOL) suppository 650 mg (has no administration in time range)  ondansetron (ZOFRAN) tablet 4 mg (has no administration in time range)    Or  ondansetron (ZOFRAN) injection 4 mg (has no administration in time range)  meropenem (MERREM) 1 g in sodium chloride 0.9 % 100 mL IVPB (has no administration in time range)  acetaminophen (TYLENOL) tablet 650 mg (650 mg Oral Given 05/12/19 1407)  meropenem (MERREM) 1 g in sodium chloride 0.9 % 100 mL IVPB (0 g Intravenous Stopped 05/12/19 1933)    Mobility walks

## 2019-05-13 DIAGNOSIS — Z9889 Other specified postprocedural states: Secondary | ICD-10-CM

## 2019-05-13 DIAGNOSIS — G4733 Obstructive sleep apnea (adult) (pediatric): Secondary | ICD-10-CM

## 2019-05-13 DIAGNOSIS — D72825 Bandemia: Secondary | ICD-10-CM

## 2019-05-13 DIAGNOSIS — N1831 Chronic kidney disease, stage 3a: Secondary | ICD-10-CM

## 2019-05-13 DIAGNOSIS — Z906 Acquired absence of other parts of urinary tract: Secondary | ICD-10-CM

## 2019-05-13 DIAGNOSIS — Z9079 Acquired absence of other genital organ(s): Secondary | ICD-10-CM

## 2019-05-13 DIAGNOSIS — E669 Obesity, unspecified: Secondary | ICD-10-CM

## 2019-05-13 DIAGNOSIS — C679 Malignant neoplasm of bladder, unspecified: Secondary | ICD-10-CM

## 2019-05-13 DIAGNOSIS — I10 Essential (primary) hypertension: Secondary | ICD-10-CM

## 2019-05-13 LAB — RETICULOCYTES
Immature Retic Fract: 12.4 % (ref 2.3–15.9)
RBC.: 3.6 MIL/uL — ABNORMAL LOW (ref 4.22–5.81)
Retic Count, Absolute: 40 10*3/uL (ref 19.0–186.0)
Retic Ct Pct: 1.1 % (ref 0.4–3.1)

## 2019-05-13 LAB — BASIC METABOLIC PANEL
Anion gap: 11 (ref 5–15)
BUN: 27 mg/dL — ABNORMAL HIGH (ref 8–23)
CO2: 24 mmol/L (ref 22–32)
Calcium: 9.4 mg/dL (ref 8.9–10.3)
Chloride: 100 mmol/L (ref 98–111)
Creatinine, Ser: 1.51 mg/dL — ABNORMAL HIGH (ref 0.61–1.24)
GFR calc Af Amer: 53 mL/min — ABNORMAL LOW (ref >60)
GFR calc non Af Amer: 46 mL/min — ABNORMAL LOW (ref >60)
Glucose, Bld: 107 mg/dL — ABNORMAL HIGH (ref 70–99)
Potassium: 4.2 mmol/L (ref 3.5–5.1)
Sodium: 135 mmol/L (ref 135–145)

## 2019-05-13 LAB — VITAMIN B12: Vitamin B-12: 268 pg/mL (ref 180–914)

## 2019-05-13 LAB — CMP14+EGFR
ALT: 17 [IU]/L (ref 0–44)
AST: 31 [IU]/L (ref 0–40)
Albumin/Globulin Ratio: 1.3 (ref 1.2–2.2)
Albumin: 4.6 g/dL (ref 3.7–4.7)
Alkaline Phosphatase: 96 [IU]/L (ref 39–117)
BUN/Creatinine Ratio: 16 (ref 10–24)
BUN: 24 mg/dL (ref 8–27)
Bilirubin Total: 0.5 mg/dL (ref 0.0–1.2)
CO2: 25 mmol/L (ref 20–29)
Calcium: 10.1 mg/dL (ref 8.6–10.2)
Chloride: 97 mmol/L (ref 96–106)
Creatinine, Ser: 1.5 mg/dL — ABNORMAL HIGH (ref 0.76–1.27)
GFR calc Af Amer: 53 mL/min/{1.73_m2} — ABNORMAL LOW
GFR calc non Af Amer: 46 mL/min/{1.73_m2} — ABNORMAL LOW
Globulin, Total: 3.6 g/dL (ref 1.5–4.5)
Glucose: 82 mg/dL (ref 65–99)
Potassium: 4.6 mmol/L (ref 3.5–5.2)
Sodium: 135 mmol/L (ref 134–144)
Total Protein: 8.2 g/dL (ref 6.0–8.5)

## 2019-05-13 LAB — CBC
HCT: 31.7 % — ABNORMAL LOW (ref 39.0–52.0)
Hemoglobin: 9.9 g/dL — ABNORMAL LOW (ref 13.0–17.0)
MCH: 27.7 pg (ref 26.0–34.0)
MCHC: 31.2 g/dL (ref 30.0–36.0)
MCV: 88.5 fL (ref 80.0–100.0)
Platelets: 168 10*3/uL (ref 150–400)
RBC: 3.58 MIL/uL — ABNORMAL LOW (ref 4.22–5.81)
RDW: 16.4 % — ABNORMAL HIGH (ref 11.5–15.5)
WBC: 12.1 10*3/uL — ABNORMAL HIGH (ref 4.0–10.5)
nRBC: 0 % (ref 0.0–0.2)

## 2019-05-13 LAB — FOLATE: Folate: 39.4 ng/mL (ref >5.9)

## 2019-05-13 LAB — IRON AND TIBC
Iron: 13 ug/dL — ABNORMAL LOW (ref 45–182)
Saturation Ratios: 6 % — ABNORMAL LOW (ref 17.9–39.5)
TIBC: 220 ug/dL — ABNORMAL LOW (ref 250–450)
UIBC: 207 ug/dL

## 2019-05-13 LAB — GLUCOSE, CAPILLARY: Glucose-Capillary: 93 mg/dL (ref 70–99)

## 2019-05-13 LAB — SARS CORONAVIRUS 2 (TAT 6-24 HRS): SARS Coronavirus 2: NEGATIVE

## 2019-05-13 LAB — FERRITIN: Ferritin: 128 ng/mL (ref 24–336)

## 2019-05-13 MED ORDER — SENNOSIDES-DOCUSATE SODIUM 8.6-50 MG PO TABS
1.0000 | ORAL_TABLET | Freq: Every day | ORAL | Status: DC | PRN
  Administered 2019-05-14: 1 via ORAL
  Filled 2019-05-13: qty 1

## 2019-05-13 MED ORDER — FERROUS SULFATE 325 (65 FE) MG PO TABS
325.0000 mg | ORAL_TABLET | Freq: Two times a day (BID) | ORAL | Status: DC
  Administered 2019-05-14 – 2019-05-16 (×5): 325 mg via ORAL
  Filled 2019-05-13 (×5): qty 1

## 2019-05-13 NOTE — Progress Notes (Signed)
   05/13/19 1409  Vitals  Temp (!) 101.2 F (38.4 C)  BP (!) 141/73  MAP (mmHg) 92  BP Location Right Arm  BP Method Automatic  Patient Position (if appropriate) Lying  Pulse Rate 100  Pulse Rate Source Monitor  Resp (!) 21  Oxygen Therapy  SpO2 96 %  O2 Device Room Air  MEWS Score  MEWS RR 1  MEWS Pulse 0  MEWS Systolic 0  MEWS LOC 0  MEWS Temp 1  MEWS Score 2  MEWS Score Color Yellow   MEWS yellow. Elevated temp and BP. Tylenol given for temperature and MD notified. Will continue to monitor pt. Luvinia Lucy W Chayne Baumgart, RN

## 2019-05-13 NOTE — Progress Notes (Addendum)
PROGRESS NOTE  Billy Rasmussen [id number] DOB: 01-11-1946   PCP: Terald Sleeper, PA-C  Patient is from: Home  DOA: 05/12/2019 LOS: 1  Brief Narrative / Interim history: 73 year old male with history of bladder cancer status post cystoprostatectomy and conduit diversion with cystostomy, recurrent UTI/pyelonephritis, ESBL Klebsiella, OSA not on CPAP, HTN and GERD presenting with sudden onset of fever, chills, diaphoresis, malaise and fatigue for 1 day.  In ED, tachycardic and febrile.  WBC 14.  Creatinine 1.7.  Lactic acid 1.9.  UA concerning for UTI.  COVID-19 negative.  Cultures obtained.  Started on IV meropenem for complicated recurrence UTI and history of ESBL..  Subjective: No major events overnight of this morning.  No complaint this morning other than feeling sleepy and fatigued.  Denies GI or UTI symptoms.  Reports making good amount of urine in urostomy.  Objective: Vitals:   05/12/19 2200 05/12/19 2220 05/12/19 2244 05/13/19 0424  BP: (!) 117/54  134/76 132/68  Pulse: 64  70 75  Resp: (!) 22  18 20   Temp:  (!) 97.4 F (36.3 C) 99.2 F (37.3 C) 98.5 F (36.9 C)  TempSrc:  Oral Oral Oral  SpO2: 100%  100% 100%  Weight:      Height:        Intake/Output Summary (Last 24 hours) at 05/13/2019 1027 Last data filed at 05/13/2019 0945 Gross per 24 hour  Intake 100 ml  Output 1350 ml  Net -1250 ml   Filed Weights   05/12/19 1807  Weight: 93.9 kg    Examination:  GENERAL: No acute distress.  Appears well.  HEENT: MMM.  Vision and hearing grossly intact.  NECK: Supple.  No apparent JVD.  RESP:  No IWOB. Good air movement bilaterally. CVS:  RRR. Heart sounds normal.  ABD/GI/GU: Bowel sounds present. Soft. Non tender.  Urostomy over RLQ. MSK/EXT:  No apparent deformity or edema. Moves extremities. SKIN: no apparent skin lesion or wound NEURO: Somewhat sleepy and drowsy but oriented x4.  No gross deficit.  PSYCH: Calm. Normal affect.  Procedures:   None  Assessment & Plan: Sepsis due to complicated UTI in patient with history of bladder cancer status post proctoscopy cystectomy and ileal conduit diversion with urostomy -Patient with history of ESBL Klebsiella UTI and Pseudomonas bacteremia recently. -Sepsis physiology resolving.  Blood cultures negative so far -Renal ultrasound with stable mild to moderate left hydronephrosis -Continue IV meropenem -Follow urine cultures -Will notify urology on call for additional input  CKD-3a: creatinine slightly higher than baseline but improved.  Renal ultrasound as above. -Cr 1.3-1.5 (baseline)> 1.7 (admit)> 1.51 -Continue IV fluids -Hold nephrotoxic meds  Recurrent bladder cancer status post cystoprostatectomy and conduit diversion Stable mild to moderate left hydronephrosis -Followed by urology  Essential hypertension: Normotensive -Continue holding lisinopril and HCTZ   OSA on CPAP: Reportedly uses 2.5 L by [place] at night.  Open to trying CPAP. -We will try nightly CPAP  Leukocytosis/bandemia: Likely due to #1 -Continue monitoring  Anemia: Combination of IDA and ACD.  H&H stable -Recheck anemia panel -Continue monitoring  Class II obesity: BMI 33.41 -Encourage lifestyle change to lose weight.               DVT prophylaxis: Subcu heparin Code Status: Full code Family Communication: Will update family later. Disposition Plan: Remains inpatient Consultants: Urology   Microbiology summarized: [id number] negative. Blood culture negative. Urine culture pending.  Sch Meds:  Scheduled Meds: . heparin  5,000 Units Subcutaneous Q8H  Continuous Infusions: . sodium chloride    . meropenem (MERREM) IV 1 g (05/13/19 0550)   PRN Meds:.acetaminophen **OR** acetaminophen, ondansetron **OR** ondansetron (ZOFRAN) IV  Antimicrobials: Anti-infectives (From admission, onward)   Start     Dose/Rate Route Frequency Ordered Stop   05/13/19 0600  meropenem (MERREM) 1 g in sodium  chloride 0.9 % 100 mL IVPB     1 g 200 mL/hr over 30 Minutes Intravenous Every 12 hours 05/12/19 2109     05/12/19 1845  meropenem (MERREM) 1 g in sodium chloride 0.9 % 100 mL IVPB     1 g 200 mL/hr over 30 Minutes Intravenous  Once 05/12/19 1830 05/12/19 1933       I have personally reviewed the following labs and images: CBC: Recent Labs  Lab 05/12/19 1851 05/13/19 0349  WBC 14.0* 12.1*  NEUTROABS 11.3*  --   HGB 10.2* 9.9*  HCT 32.9* 31.7*  MCV 88.9 88.5  PLT 200 168   BMP &GFR Recent Labs  Lab 05/12/19 1302 05/12/19 1851 05/13/19 0349  NA 135 135 135  K 4.6 4.0 4.2  CL 97 99 100  CO2 25 26 24   GLUCOSE 82 147* 107*  BUN 24 29* 27*  CREATININE 1.50* 1.70* 1.51*  CALCIUM 10.1 9.5 9.4   Estimated Creatinine Clearance: 48.1 mL/min (A) (by C-G formula based on SCr of 1.51 mg/dL (H)). Liver & Pancreas: Recent Labs  Lab 05/12/19 1302 05/12/19 1851  AST 31 28  ALT 17 19  ALKPHOS 96 66  BILITOT 0.5 0.9  PROT 8.2 8.1  ALBUMIN 4.6 3.8   No results for input(s): LIPASE, AMYLASE in the last 168 hours. No results for input(s): AMMONIA in the last 168 hours. Diabetic: No results for input(s): HGBA1C in the last 72 hours. Recent Labs  Lab 05/13/19 0526  GLUCAP 93   Cardiac Enzymes: No results for input(s): CKTOTAL, CKMB, CKMBINDEX, TROPONINI in the last 168 hours. No results for input(s): PROBNP in the last 8760 hours. Coagulation Profile: Recent Labs  Lab 05/12/19 1851  INR 1.2   Thyroid Function Tests: No results for input(s): TSH, T4TOTAL, FREET4, T3FREE, THYROIDAB in the last 72 hours. Lipid Profile: No results for input(s): CHOL, HDL, LDLCALC, TRIG, CHOLHDL, LDLDIRECT in the last 72 hours. Anemia Panel: No results for input(s): VITAMINB12, FOLATE, FERRITIN, TIBC, IRON, RETICCTPCT in the last 72 hours. Urine analysis:    Component Value Date/Time   COLORURINE AMBER (A) 05/12/2019 1851   APPEARANCEUR CLOUDY (A) 05/12/2019 1851   APPEARANCEUR  Cloudy (A) 01/27/2018 1625   LABSPEC 1.014 05/12/2019 1851   PHURINE 6.0 05/12/2019 1851   GLUCOSEU NEGATIVE 05/12/2019 1851   HGBUR MODERATE (A) 05/12/2019 1851   BILIRUBINUR NEGATIVE 05/12/2019 1851   BILIRUBINUR Negative 01/27/2018 [place] 05/12/2019 1851   PROTEINUR 30 (A) 05/12/2019 1851   NITRITE NEGATIVE 05/12/2019 1851   LEUKOCYTESUR LARGE (A) 05/12/2019 1851   Sepsis Labs: Invalid input(s): PROCALCITONIN, [place]  Microbiology: Recent Results (from the past 240 hour(s))  Blood Culture (routine x 2)     Status: None (Preliminary result)   Collection Time: 05/12/19  6:52 PM   Specimen: BLOOD  Result Value Ref Range Status   Specimen Description   Final    BLOOD LEFT ANTECUBITAL Performed at [facility] [address] [place], [place] [address]    Special Requests   Final    BOTTLES DRAWN AEROBIC AND ANAEROBIC Blood Culture adequate volume Performed at [facility]  [facility], [place] [address], [place], [place] [address]    Culture   Final    NO GROWTH < 12 HOURS Performed at [facility] [address], [place], [place] [address]    Report Status PENDING  Incomplete  Blood Culture (routine x 2)     Status: None (Preliminary result)   Collection Time: 05/12/19  7:00 PM   Specimen: BLOOD LEFT HAND  Result Value Ref Range Status   Specimen Description   Final    BLOOD LEFT HAND Performed at [facility] [address], [place], [place] [address]    Special Requests   Final    BOTTLES DRAWN AEROBIC AND ANAEROBIC Blood Culture adequate volume Performed at [facility] [address], [place], [place] [address]    Culture   Final    NO GROWTH < 12 HOURS Performed at [facility] [address] [place], [place] [address]    Report Status PENDING  Incomplete  SARS CORONAVIRUS 2 (TAT 6-24 HRS) Nasopharyngeal Nasopharyngeal Swab     Status: None   Collection Time:  05/12/19  8:09 PM   Specimen: Nasopharyngeal Swab  Result Value Ref Range Status   SARS Coronavirus 2 NEGATIVE NEGATIVE Final    Comment: (NOTE) SARS-CoV-2 target nucleic acids are NOT DETECTED. The SARS-CoV-2 RNA is generally detectable in upper and lower respiratory specimens during the acute phase of infection. Negative results do not preclude SARS-CoV-2 infection, do not rule out co-infections with other pathogens, and should not be used as the sole basis for treatment or other patient management decisions. Negative results must be combined with clinical observations, patient history, and epidemiological information. The expected result is Negative. Fact Sheet for Patients: SugarRoll.be Fact Sheet for Healthcare Providers: https://www.woods-mathews.com/ This test is not yet approved or cleared by the [place] FDA and  has been authorized for detection and/or diagnosis of SARS-CoV-2 by FDA under an Emergency Use Authorization (EUA). This EUA will remain  in effect (meaning this test can be used) for the duration of the COVID-19 declaration under Section 56 4(b)(1) of the Act, 21 U.S.C. section 360bbb-3(b)(1), unless the authorization is terminated or revoked sooner. Performed at [facility], [place] [address], [place], [place] [address]     Radiology Studies: US RENAL  Result Date: 05/12/2019 CLINICAL DATA:  Acute kidney injury EXAM: RENAL / URINARY TRACT ULTRASOUND COMPLETE COMPARISON:  April 10, 2019 FINDINGS: Right Kidney: Renal measurements: 11.5 x 5.4 x 6.27 = volume: 202 mL. There is no hydronephrosis. There is increased cortical echogenicity. Left Kidney: Renal measurements: 10.3 x 5.9 x 5.6 cm = volume: 146 mL. There is moderate left-sided hydronephrosis. There is increased cortical echogenicity. Bladder: The bladder has been reportedly removed. Other: None. IMPRESSION: 1. Mild to moderate left-sided  hydroureteronephrosis. This is similar in appearance to prior renal ultrasound dated April 10, 2019 2. No right-sided hydronephrosis. 3. Echogenic kidneys bilaterally which can be seen in patients with medical renal disease. Electronically Signed   By: Constance Holster M.D.   On: 05/12/2019 22:27    40 minutes with more than 50% spent in reviewing records, counseling patient/family and coordinating care.   Letrice Pollok T. Selfridge  If 7PM-7AM, please contact night-coverage [email or web address] Password [facility] 05/13/2019, 10:27 AM

## 2019-05-13 NOTE — Plan of Care (Signed)
  Problem: Education: Goal: Knowledge of General Education information will improve Description: Including pain rating scale, medication(s)/side effects and non-pharmacologic comfort measures Outcome: Progressing   Problem: Health Behavior/Discharge Planning: Goal: Ability to manage health-related needs will improve Outcome: Progressing   Problem: Clinical Measurements: Goal: Ability to maintain clinical measurements within normal limits will improve Outcome: Progressing Goal: Will remain free from infection Outcome: Progressing Goal: Diagnostic test results will improve Outcome: Progressing   Problem: Clinical Measurements: Goal: Respiratory complications will improve Outcome: Adequate for Discharge Goal: Cardiovascular complication will be avoided Outcome: Adequate for Discharge   Problem: Activity: Goal: Risk for activity intolerance will decrease Outcome: Adequate for Discharge   Problem: Nutrition: Goal: Adequate nutrition will be maintained Outcome: Adequate for Discharge   Problem: Coping: Goal: Level of anxiety will decrease Outcome: Adequate for Discharge   Problem: Elimination: Goal: Will not experience complications related to bowel motility Outcome: Adequate for Discharge Goal: Will not experience complications related to urinary retention Outcome: Adequate for Discharge   Problem: Pain Managment: Goal: General experience of comfort will improve Outcome: Adequate for Discharge   Problem: Safety: Goal: Ability to remain free from injury will improve Outcome: Adequate for Discharge   Problem: Skin Integrity: Goal: Risk for impaired skin integrity will decrease Outcome: Adequate for Discharge

## 2019-05-14 LAB — CBC
HCT: 32.6 % — ABNORMAL LOW (ref 39.0–52.0)
Hemoglobin: 9.9 g/dL — ABNORMAL LOW (ref 13.0–17.0)
MCH: 27.3 pg (ref 26.0–34.0)
MCHC: 30.4 g/dL (ref 30.0–36.0)
MCV: 89.8 fL (ref 80.0–100.0)
Platelets: 181 10*3/uL (ref 150–400)
RBC: 3.63 MIL/uL — ABNORMAL LOW (ref 4.22–5.81)
RDW: 16.4 % — ABNORMAL HIGH (ref 11.5–15.5)
WBC: 9.2 10*3/uL (ref 4.0–10.5)
nRBC: 0 % (ref 0.0–0.2)

## 2019-05-14 LAB — RENAL FUNCTION PANEL
Albumin: 3.3 g/dL — ABNORMAL LOW (ref 3.5–5.0)
Anion gap: 8 (ref 5–15)
BUN: 21 mg/dL (ref 8–23)
CO2: 24 mmol/L (ref 22–32)
Calcium: 9.3 mg/dL (ref 8.9–10.3)
Chloride: 103 mmol/L (ref 98–111)
Creatinine, Ser: 1.37 mg/dL — ABNORMAL HIGH (ref 0.61–1.24)
GFR calc Af Amer: 60 mL/min — ABNORMAL LOW (ref >60)
GFR calc non Af Amer: 52 mL/min — ABNORMAL LOW (ref >60)
Glucose, Bld: 94 mg/dL (ref 70–99)
Phosphorus: 2.1 mg/dL — ABNORMAL LOW (ref 2.5–4.6)
Potassium: 4.1 mmol/L (ref 3.5–5.1)
Sodium: 135 mmol/L (ref 135–145)

## 2019-05-14 LAB — MAGNESIUM: Magnesium: 2 mg/dL (ref 1.7–2.4)

## 2019-05-14 MED ORDER — K PHOS MONO-SOD PHOS DI & MONO 155-852-130 MG PO TABS
250.0000 mg | ORAL_TABLET | Freq: Three times a day (TID) | ORAL | Status: AC
  Administered 2019-05-14 – 2019-05-15 (×6): 250 mg via ORAL
  Filled 2019-05-14 (×6): qty 1

## 2019-05-14 MED ORDER — SODIUM CHLORIDE 0.9 % IV SOLN
1.0000 g | Freq: Three times a day (TID) | INTRAVENOUS | Status: DC
  Administered 2019-05-14 – 2019-05-16 (×6): 1 g via INTRAVENOUS
  Filled 2019-05-14 (×7): qty 1

## 2019-05-14 NOTE — Progress Notes (Addendum)
PROGRESS NOTE    Billy Rasmussen  [id number] DOB: 26-Jun-1945 DOA: 05/12/2019 PCP: Terald Sleeper, PA-C   Brief Narrative:  73 year old male with history of bladder cancer status post cystoprostatectomy and conduit diversion with cystostomy, recurrent UTI/pyelonephritis, ESBL Klebsiella, OSA not on CPAP, HTN and GERD presenting with sudden onset of fever, chills, diaphoresis, malaise and fatigue for 1 day. In ED, tachycardic and febrile.  WBC 14.  Creatinine 1.7.  Lactic acid 1.9.  UA concerning for UTI.  COVID-19 negative.  Cultures obtained.  Started on IV meropenem for complicated recurrence of UTI and history of ESBL.[place] Kitchen Renal ultrasound with a stable mild to moderate left hydronephrosis. He is improving , remains afebrile.  Assessment & Plan:   Principal Problem:   Sepsis due to urinary tract infection ([place]) Active Problems:   HTN (hypertension)   Bladder cancer (HCC)   AKI (acute kidney injury) (HCC)   OSA (obstructive sleep apnea)  # Sepsis due to complicated UTI in patient with history of bladder cancer status post proctoscopy cystectomy and ileal conduit diversion with urostomy -Patient with history of ESBL Klebsiella UTI and Pseudomonas bacteremia recently. -Sepsis physiology resolving.  Blood cultures negative so far -Renal ultrasound with stable mild to moderate left hydronephrosis -Continue IV meropenem -Urine culture grew 100,000 colonies of gram-negative rods ( Pseudomonas) sensitivity pending. -Will notify urology on call for additional input  CKD-3a: creatinine slightly higher than baseline but improved.  Renal ultrasound as above. -Cr 1.3-1.5 (baseline)> 1.7 (admit)> 1.51 -Continue IV fluids -Hold nephrotoxic meds  Recurrent bladder cancer status post cystoprostatectomy and conduit diversion Stable mild to moderate left hydronephrosis -Followed by urology  Essential hypertension: Normotensive -Continue holding lisinopril and HCTZ   OSA on CPAP:  Reportedly uses 2.5 L by [place] at night.  Open to trying CPAP. -We will try nightly CPAP  Leukocytosis/bandemia: Likely due to #1 -Continue monitoring and antibiotics.  Anemia: Combination of IDA and ACD.  H&H stable -Recheck anemia panel -Continue monitoring  Class II obesity: BMI 33.41 -Encourage lifestyle change to lose weight.    DVT prophylaxis: Subcu heparin Code Status: Full code Family Communication:  Discussed with patient in detail Disposition Plan: Remains inpatient Consultants: Urology   Consultants:   Urology Dr. Meriam Sprague  Procedures:  Antimicrobials: Anti-infectives (From admission, onward)   Start     Dose/Rate Route Frequency Ordered Stop   05/13/19 0600  meropenem (MERREM) 1 g in sodium chloride 0.9 % 100 mL IVPB     1 g 200 mL/hr over 30 Minutes Intravenous Every 12 hours 05/12/19 2109     05/12/19 1845  meropenem (MERREM) 1 g in sodium chloride 0.9 % 100 mL IVPB     1 g 200 mL/hr over 30 Minutes Intravenous  Once 05/12/19 1830 05/12/19 1933      Subjective: Patient seen and examined at bedside, He denies any fever, denies any pain.  Able to urinate well.  Objective: Vitals:   05/13/19 2056 05/14/19 0539 05/14/19 0545 05/14/19 1326  BP: 136/71 (!) 157/84 (!) 143/83 (!) 153/82  Pulse: 69 76 72 (!) 101  Resp: 16 18    Temp: 98.2 F (36.8 C) 98.2 F (36.8 C)  98.3 F (36.8 C)  TempSrc: Oral Oral  Oral  SpO2: 100% 100% 100% 100%  Weight:      Height:        Intake/Output Summary (Last 24 hours) at 05/14/2019 1510 Last data filed at 05/14/2019 1230 Gross per 24 hour  Intake 1060.09 ml  Output 2600 ml  Net -1539.91 ml   Filed Weights   05/12/19 1807  Weight: 93.9 kg    Examination:  General exam: Appears calm and comfortable  Respiratory system: Clear to auscultation. Respiratory effort normal. Cardiovascular system: S1 & S2 heard, RRR. No JVD, murmurs, rubs, gallops or clicks. No pedal edema. Gastrointestinal system: Abdomen is  nondistended, soft and nontender. No organomegaly or masses felt. Normal bowel sounds heard. Central nervous system: Alert and oriented. No focal neurological deficits. Extremities: No edema, no calf tenderness Skin: No rashes, lesions or ulcers Psychiatry: Judgement and insight appear normal. Mood & affect appropriate.     Data Reviewed: I have personally reviewed following labs and imaging studies  CBC: Recent Labs  Lab 05/12/19 1851 05/13/19 0349 05/14/19 0514  WBC 14.0* 12.1* 9.2  NEUTROABS 11.3*  --   --   HGB 10.2* 9.9* 9.9*  HCT 32.9* 31.7* 32.6*  MCV 88.9 88.5 89.8  PLT 200 168 0000000   Basic Metabolic Panel: Recent Labs  Lab 05/12/19 1302 05/12/19 1851 05/13/19 0349 05/14/19 0514  NA 135 135 135 135  K 4.6 4.0 4.2 4.1  CL 97 99 100 103  CO2 25 26 24 24   GLUCOSE 82 147* 107* 94  BUN 24 29* 27* 21  CREATININE 1.50* 1.70* 1.51* 1.37*  CALCIUM 10.1 9.5 9.4 9.3  MG  --   --   --  2.0  PHOS  --   --   --  2.1*   GFR: Estimated Creatinine Clearance: 53 mL/min (A) (by C-G formula based on SCr of 1.37 mg/dL (H)). Liver Function Tests: Recent Labs  Lab 05/12/19 1302 05/12/19 1851 05/14/19 0514  AST 31 28  --   ALT 17 19  --   ALKPHOS 96 66  --   BILITOT 0.5 0.9  --   PROT 8.2 8.1  --   ALBUMIN 4.6 3.8 3.3*   No results for input(s): LIPASE, AMYLASE in the last 168 hours. No results for input(s): AMMONIA in the last 168 hours. Coagulation Profile: Recent Labs  Lab 05/12/19 1851  INR 1.2   Cardiac Enzymes: No results for input(s): CKTOTAL, CKMB, CKMBINDEX, TROPONINI in the last 168 hours. BNP (last 3 results) No results for input(s): PROBNP in the last 8760 hours. HbA1C: No results for input(s): HGBA1C in the last 72 hours. CBG: Recent Labs  Lab 05/13/19 0526  GLUCAP 93   Lipid Profile: No results for input(s): CHOL, HDL, LDLCALC, TRIG, CHOLHDL, LDLDIRECT in the last 72 hours. Thyroid Function Tests: No results for input(s): TSH, T4TOTAL,  FREET4, T3FREE, THYROIDAB in the last 72 hours. Anemia Panel: Recent Labs    05/13/19 1125  VITAMINB12 268  FOLATE 39.4  FERRITIN 128  TIBC 220*  IRON 13*  RETICCTPCT 1.1   Sepsis Labs: Recent Labs  Lab 05/12/19 1851  LATICACIDVEN 1.9    Recent Results (from the past 240 hour(s))  Urine culture     Status: Abnormal (Preliminary result)   Collection Time: 05/12/19  6:51 PM   Specimen: In/Out Cath Urine  Result Value Ref Range Status   Specimen Description   Final    IN/OUT CATH URINE Performed at [facility], [place] [address] [place], [place] [address]    Special Requests   Final    NONE Performed at [facility], [place] [address] [place], [place] [address]    Culture (A)  Final    >=100,000 COLONIES/mL GRAM NEGATIVE RODS 70,000 COLONIES/mL  PSEUDOMONAS AERUGINOSA CULTURE REINCUBATED FOR BETTER GROWTH Performed at [facility], [place] [address], [place], [place] [address]    Report Status PENDING  Incomplete  Blood Culture (routine x 2)     Status: None (Preliminary result)   Collection Time: 05/12/19  6:52 PM   Specimen: BLOOD  Result Value Ref Range Status   Specimen Description   Final    BLOOD LEFT ANTECUBITAL Performed at [facility] [address], [place], [place] [address]    Special Requests   Final    BOTTLES DRAWN AEROBIC AND ANAEROBIC Blood Culture adequate volume Performed at [facility] [address], [place], [place] [address]    Culture   Final    NO GROWTH 2 DAYS Performed at [facility] [address], [place], [place] [address]    Report Status PENDING  Incomplete  Blood Culture (routine x 2)     Status: None (Preliminary result)   Collection Time: 05/12/19  7:00 PM   Specimen: BLOOD LEFT HAND  Result Value Ref Range Status   Specimen Description   Final    BLOOD LEFT HAND Performed at [facility] [address]  [place], [place] [address]    Special Requests   Final    BOTTLES DRAWN AEROBIC AND ANAEROBIC Blood Culture adequate volume Performed at [facility] [address], [place], [place] [address]    Culture   Final    NO GROWTH 2 DAYS Performed at [facility] [address], [place], [place] [address]    Report Status PENDING  Incomplete  SARS CORONAVIRUS 2 (TAT 6-24 HRS) Nasopharyngeal Nasopharyngeal Swab     Status: None   Collection Time: 05/12/19  8:09 PM   Specimen: Nasopharyngeal Swab  Result Value Ref Range Status   SARS Coronavirus 2 NEGATIVE NEGATIVE Final    Comment: (NOTE) SARS-CoV-2 target nucleic acids are NOT DETECTED. The SARS-CoV-2 RNA is generally detectable in upper and lower respiratory specimens during the acute phase of infection. Negative results do not preclude SARS-CoV-2 infection, do not rule out co-infections with other pathogens, and should not be used as the sole basis for treatment or other patient management decisions. Negative results must be combined with clinical observations, patient history, and epidemiological information. The expected result is Negative. Fact Sheet for Patients: SugarRoll.be Fact Sheet for Healthcare Providers: https://www.woods-mathews.com/ This test is not yet approved or cleared by the [place] FDA and  has been authorized for detection and/or diagnosis of SARS-CoV-2 by FDA under an Emergency Use Authorization (EUA). This EUA will remain  in effect (meaning this test can be used) for the duration of the COVID-19 declaration under Section 56 4(b)(1) of the Act, 21 U.S.C. section 360bbb-3(b)(1), unless the authorization is terminated or revoked sooner. Performed at [facility], [place] [address] [place], [place] [address]          Radiology Studies: US RENAL  Result Date: 05/12/2019 CLINICAL DATA:  Acute kidney injury EXAM: RENAL / URINARY TRACT  ULTRASOUND COMPLETE COMPARISON:  April 10, 2019 FINDINGS: Right Kidney: Renal measurements: 11.5 x 5.4 x 6.27 = volume: 202 mL. There is no hydronephrosis. There is increased cortical echogenicity. Left Kidney: Renal measurements: 10.3 x 5.9 x 5.6 cm = volume: 146 mL. There is moderate left-sided hydronephrosis. There is increased cortical echogenicity. Bladder: The bladder has been reportedly removed. Other: None. IMPRESSION: 1. Mild to moderate left-sided hydroureteronephrosis. This  is similar in appearance to prior renal ultrasound dated April 10, 2019 2. No right-sided hydronephrosis. 3. Echogenic kidneys bilaterally which can be seen in patients with medical renal disease. Electronically Signed   By: Constance Holster M.D.   On: 05/12/2019 22:27        Scheduled Meds: . ferrous sulfate  325 mg Oral BID WC  . heparin  5,000 Units Subcutaneous Q8H  . phosphorus  250 mg Oral TID   Continuous Infusions: . sodium chloride 125 mL/hr at 05/14/19 0700  . meropenem (MERREM) IV 1 g (05/14/19 0625)     LOS: 2 days    Time spent:     Shawna Clamp, MD [facility]   If 7PM-7AM, please contact night-coverage

## 2019-05-14 NOTE — Progress Notes (Signed)
Discussed with urology, Dr. Alinda Money who recommended continuing current antibiotics and calling back if no improvement.

## 2019-05-14 NOTE — Progress Notes (Signed)
PHARMACY NOTE:  ANTIMICROBIAL RENAL DOSAGE ADJUSTMENT  Current antimicrobial regimen includes a mismatch between antimicrobial dosage and estimated renal function. As per policy approved by the Pharmacy & Therapeutics and Medical Executive Committees, the antimicrobial dosage will be adjusted accordingly.  Current antimicrobial and dosage:  meropenem 1g q12 hr  Indication: ESBL UTI  Renal Function:   Estimated Creatinine Clearance: 53 mL/min (A) (by C-G formula based on SCr of 1.37 mg/dL (H)). []      On intermittent HD, scheduled: []      On CRRT    Antimicrobial dosage has been changed to:  1g IV q12 hr   Additional Comments: none   Thank you for allowing pharmacy to be a part of this patient's care.  Reuel Boom, PharmD, BCPS [phone] 05/14/2019, 5:48 PM

## 2019-05-15 LAB — COMPREHENSIVE METABOLIC PANEL
ALT: 15 U/L (ref 0–44)
AST: 22 U/L (ref 15–41)
Albumin: 3.1 g/dL — ABNORMAL LOW (ref 3.5–5.0)
Alkaline Phosphatase: 55 U/L (ref 38–126)
Anion gap: 8 (ref 5–15)
BUN: 20 mg/dL (ref 8–23)
CO2: 25 mmol/L (ref 22–32)
Calcium: 9.4 mg/dL (ref 8.9–10.3)
Chloride: 104 mmol/L (ref 98–111)
Creatinine, Ser: 1.2 mg/dL (ref 0.61–1.24)
GFR calc Af Amer: >60 mL/min (ref >60)
GFR calc non Af Amer: >60 mL/min (ref >60)
Glucose, Bld: 88 mg/dL (ref 70–99)
Potassium: 4 mmol/L (ref 3.5–5.1)
Sodium: 137 mmol/L (ref 135–145)
Total Bilirubin: 0.7 mg/dL (ref 0.3–1.2)
Total Protein: 7 g/dL (ref 6.5–8.1)

## 2019-05-15 LAB — CBC
HCT: 30.3 % — ABNORMAL LOW (ref 39.0–52.0)
Hemoglobin: 9.7 g/dL — ABNORMAL LOW (ref 13.0–17.0)
MCH: 27.8 pg (ref 26.0–34.0)
MCHC: 32 g/dL (ref 30.0–36.0)
MCV: 86.8 fL (ref 80.0–100.0)
Platelets: 181 10*3/uL (ref 150–400)
RBC: 3.49 MIL/uL — ABNORMAL LOW (ref 4.22–5.81)
RDW: 15.9 % — ABNORMAL HIGH (ref 11.5–15.5)
WBC: 6.7 10*3/uL (ref 4.0–10.5)
nRBC: 0 % (ref 0.0–0.2)

## 2019-05-15 LAB — MAGNESIUM: Magnesium: 1.8 mg/dL (ref 1.7–2.4)

## 2019-05-15 MED ORDER — POLYVINYL ALCOHOL 1.4 % OP SOLN
1.0000 [drp] | OPHTHALMIC | Status: DC | PRN
  Administered 2019-05-15: 1 [drp] via OPHTHALMIC
  Filled 2019-05-15: qty 15

## 2019-05-15 MED ORDER — LISINOPRIL 5 MG PO TABS
5.0000 mg | ORAL_TABLET | Freq: Every day | ORAL | Status: DC
  Administered 2019-05-15 – 2019-05-16 (×2): 5 mg via ORAL
  Filled 2019-05-15 (×2): qty 1

## 2019-05-15 NOTE — Progress Notes (Signed)
PROGRESS NOTE    Billy Rasmussen  [id number] DOB: 1945-12-14 DOA: 05/12/2019 PCP: Terald Sleeper, PA-C   Brief Narrative:  73 year old male with history of bladder cancer status post cystoprostatectomy and conduit diversion with cystostomy, recurrent UTI/pyelonephritis, ESBL Klebsiella, OSA not on CPAP, HTN and GERD presenting with sudden onset of fever, chills, diaphoresis, malaise and fatigue for 1 day. In ED, tachycardic and febrile.  WBC 14.  Creatinine 1.7.  Lactic acid 1.9.  UA concerning for UTI.  COVID-19 negative.  Cultures obtained.  Started on IV meropenem for complicated recurrence of UTI and history of ESBL.Marland Kitchen Renal ultrasound with a stable mild to moderate left hydronephrosis. He is improving , remains afebrile.  Assessment & Plan:   Principal Problem:   Sepsis due to urinary tract infection (Billy Rasmussen) Active Problems:   HTN (hypertension)   Bladder cancer (HCC)   AKI (acute kidney injury) (HCC)   OSA (obstructive sleep apnea)  # Sepsis due to complicated UTI in patient with history of bladder cancer status post proctoscopy cystectomy and ileal conduit diversion with urostomy- pseudomonas and esbl Klebsiella -Patient with history of ESBL Klebsiella UTI and Pseudomonas bacteremia recently. -Sepsis physiology resolving.  Blood cultures negative so far -Renal ultrasound with stable mild to moderate left hydronephrosis -Continue IV meropenem -Urine culture grew 100,000 colonies of gram-negative rods ( Pseudomonas) sensitivity pending. -  cx foley, meropenem   aki on CKD-3a: liekly due to acei/ hctz   improved.  Renal ultrasound as above.  Chronic left-sided hydroureteronephrosis -Cr 1.3-1.5 (baseline)> 1.7 (admit)> 1.51 -Continue IV fluids -Hold nephrotoxic meds Outpatient follow-up with urology  Recurrent bladder cancer status post cystoprostatectomy and conduit diversion Stable mild to moderate left hydronephrosis -Followed by urology  Essential hypertension:    Resume lisinopril at lower dose of 5 mg daily  -Continue holding HCTZ   OSA on CPAP: Reportedly uses 2.5 L by [place] at night.  Open to trying CPAP. -We will try nightly CPAP  Leukocytosis/bandemia: Likely due to #1 -Continue monitoring and antibiotics.  Anemia: Combination of IDA and ACD.  H&H stable -Recheck anemia panel -Continue monitoring  Class II obesity: BMI 33.41 -Encourage lifestyle change to lose weight.    DVT prophylaxis: Subcu heparin Code Status: Full code Family Communication:  Discussed with patient in detail Disposition Plan: Remains inpatient Consultants: Urology   Consultants:   Urology Dr. Meriam Sprague  Procedures:  Antimicrobials: Anti-infectives (From admission, onward)   Start     Dose/Rate Route Frequency Ordered Stop   05/14/19 2200  meropenem (MERREM) 1 g in sodium chloride 0.9 % 100 mL IVPB     1 g 200 mL/hr over 30 Minutes Intravenous Every 8 hours 05/14/19 1747     05/13/19 0600  meropenem (MERREM) 1 g in sodium chloride 0.9 % 100 mL IVPB  Status:  Discontinued     1 g 200 mL/hr over 30 Minutes Intravenous Every 12 hours 05/12/19 2109 05/14/19 1747   05/12/19 1845  meropenem (MERREM) 1 g in sodium chloride 0.9 % 100 mL IVPB     1 g 200 mL/hr over 30 Minutes Intravenous  Once 05/12/19 1830 05/12/19 1933      Subjective: Patient seen and examined at bedside, He denies any fever, denies any pain.  Able to urinate well.  Denies any complaints   Objective: Vitals:   05/14/19 1326 05/14/19 2046 05/15/19 0512 05/15/19 1326  BP: (!) 153/82 (!) 151/77 130/71 (!) 143/85  Pulse: (!) 101 81 71 73  Resp:  20  18 18  Temp: 98.3 F (36.8 C) 99.5 F (37.5 C) 98.1 F (36.7 C) 98.1 F (36.7 C)  TempSrc: Oral     SpO2: 100% 97% 100% 99%  Weight:      Height:        Intake/Output Summary (Last 24 hours) at 05/15/2019 1752 Last data filed at 05/15/2019 1653 Gross per 24 hour  Intake 1711.77 ml  Output 1400 ml  Net 311.77 ml   Filed  Weights   05/12/19 1807  Weight: 93.9 kg    Examination:  General exam: Appears calm and comfortable  Respiratory system: Clear to auscultation. Respiratory effort normal. Cardiovascular system: S1 & S2 heard, RRR. No JVD, murmurs, rubs, gallops or clicks. No pedal edema. Gastrointestinal system: Abdomen is nondistended, soft and nontender. No organomegaly or masses felt. Normal bowel sounds heard. Central nervous system: Alert and oriented. No focal neurological deficits. Extremities: No edema, no calf tenderness Skin: No rashes, lesions or ulcers Psychiatry: Judgement and insight appear normal. Mood & affect appropriate.     Data Reviewed: I have personally reviewed following labs and imaging studies  CBC: Recent Labs  Lab 05/12/19 1851 05/13/19 0349 05/14/19 0514 05/15/19 0642  WBC 14.0* 12.1* 9.2 6.7  NEUTROABS 11.3*  --   --   --   HGB 10.2* 9.9* 9.9* 9.7*  HCT 32.9* 31.7* 32.6* 30.3*  MCV 88.9 88.5 89.8 86.8  PLT 200 168 181 0000000   Basic Metabolic Panel: Recent Labs  Lab 05/12/19 1302 05/12/19 1851 05/13/19 0349 05/14/19 0514 05/15/19 0642  NA 135 135 135 135 137  K 4.6 4.0 4.2 4.1 4.0  CL 97 99 100 103 104  CO2 25 26 24 24 25   GLUCOSE 82 147* 107* 94 88  BUN 24 29* 27* 21 20  CREATININE 1.50* 1.70* 1.51* 1.37* 1.20  CALCIUM 10.1 9.5 9.4 9.3 9.4  MG  --   --   --  2.0 1.8  PHOS  --   --   --  2.1*  --    GFR: Estimated Creatinine Clearance: 60.5 mL/min (by C-G formula based on SCr of 1.2 mg/dL). Liver Function Tests: Recent Labs  Lab 05/12/19 1302 05/12/19 1851 05/14/19 0514 05/15/19 0642  AST 31 28  --  22  ALT 17 19  --  15  ALKPHOS 96 66  --  55  BILITOT 0.5 0.9  --  0.7  PROT 8.2 8.1  --  7.0  ALBUMIN 4.6 3.8 3.3* 3.1*   No results for input(s): LIPASE, AMYLASE in the last 168 hours. No results for input(s): AMMONIA in the last 168 hours. Coagulation Profile: Recent Labs  Lab 05/12/19 1851  INR 1.2   Cardiac Enzymes: No results  for input(s): CKTOTAL, CKMB, CKMBINDEX, TROPONINI in the last 168 hours. BNP (last 3 results) No results for input(s): PROBNP in the last 8760 hours. HbA1C: No results for input(s): HGBA1C in the last 72 hours. CBG: Recent Labs  Lab 05/13/19 0526  GLUCAP 93   Lipid Profile: No results for input(s): CHOL, HDL, LDLCALC, TRIG, CHOLHDL, LDLDIRECT in the last 72 hours. Thyroid Function Tests: No results for input(s): TSH, T4TOTAL, FREET4, T3FREE, THYROIDAB in the last 72 hours. Anemia Panel: Recent Labs    05/13/19 1125  VITAMINB12 268  FOLATE 39.4  FERRITIN 128  TIBC 220*  IRON 13*  RETICCTPCT 1.1   Sepsis Labs: Recent Labs  Lab 05/12/19 1851  LATICACIDVEN 1.9    Recent Results (from the past 240  hour(s))  Urine culture     Status: Abnormal (Preliminary result)   Collection Time: 05/12/19  6:51 PM   Specimen: In/Out Cath Urine  Result Value Ref Range Status   Specimen Description   Final    IN/OUT CATH URINE Performed at [facility] [address] [place], [place] [address]    Special Requests   Final    NONE Performed at [facility], [place] [address], [place], [place] [address]    Culture (A)  Final    >=100,000 COLONIES/mL KLEBSIELLA PNEUMONIAE 70,000 COLONIES/mL PSEUDOMONAS AERUGINOSA SUSCEPTIBILITIES TO FOLLOW CULTURE REINCUBATED FOR BETTER GROWTH ORGANISM 1 Confirmed Extended Spectrum Beta-Lactamase Producer (ESBL).  In bloodstream infections from ESBL organisms, carbapenems are preferred over piperacillin/tazobactam. They are shown to have a lower risk of mortality.    Report Status PENDING  Incomplete  Blood Culture (routine x 2)     Status: None (Preliminary result)   Collection Time: 05/12/19  6:52 PM   Specimen: BLOOD  Result Value Ref Range Status   Specimen Description   Final    BLOOD LEFT ANTECUBITAL Performed at [facility] [address] [place], [place] [address]    Special  Requests   Final    BOTTLES DRAWN AEROBIC AND ANAEROBIC Blood Culture adequate volume Performed at [facility] [address], [place], [place] [address]    Culture   Final    NO GROWTH 3 DAYS Performed at [facility], [place] [address], [place], [place] [address]    Report Status PENDING  Incomplete  Blood Culture (routine x 2)     Status: None (Preliminary result)   Collection Time: 05/12/19  7:00 PM   Specimen: BLOOD LEFT HAND  Result Value Ref Range Status   Specimen Description   Final    BLOOD LEFT HAND Performed at [facility] [address], [place], [place] [address]    Special Requests   Final    BOTTLES DRAWN AEROBIC AND ANAEROBIC Blood Culture adequate volume Performed at [facility] [address], [place], [place] [address]    Culture   Final    NO GROWTH 3 DAYS Performed at [facility], [place] [address], [place], [place] [address]    Report Status PENDING  Incomplete  SARS CORONAVIRUS 2 (TAT 6-24 HRS) Nasopharyngeal Nasopharyngeal Swab     Status: None   Collection Time: 05/12/19  8:09 PM   Specimen: Nasopharyngeal Swab  Result Value Ref Range Status   SARS Coronavirus 2 NEGATIVE NEGATIVE Final    Comment: (NOTE) SARS-CoV-2 target nucleic acids are NOT DETECTED. The SARS-CoV-2 RNA is generally detectable in upper and lower respiratory specimens during the acute phase of infection. Negative results do not preclude SARS-CoV-2 infection, do not rule out co-infections with other pathogens, and should not be used as the sole basis for treatment or other patient management decisions. Negative results must be combined with clinical observations, patient history, and epidemiological information. The expected result is Negative. Fact Sheet for Patients: SugarRoll.be Fact Sheet for Healthcare Providers: https://www.woods-mathews.com/ This test is not yet  approved or cleared by the [place] FDA and  has been authorized for detection and/or diagnosis of SARS-CoV-2 by FDA under an Emergency Use Authorization (EUA). This EUA will remain  in effect (meaning this test can be used) for the duration of the COVID-19 declaration under Section 56 4(b)(1) of the Act, 21 U.S.C. section 360bbb-3(b)(1), unless  the authorization is terminated or revoked sooner. Performed at [facility], [place] [address] [place], [place] [address]          Radiology Studies: No results found.      Scheduled Meds: . ferrous sulfate  325 mg Oral BID WC  . heparin  5,000 Units Subcutaneous Q8H  . phosphorus  250 mg Oral TID   Continuous Infusions: . sodium chloride 125 mL/hr at 05/15/19 1001  . meropenem (MERREM) IV 1 g (05/15/19 1404)     LOS: 3 days    Time spent:     Rielly Corlett Harmon Pier, MD [facility]   If 7PM-7AM, please contact night-coverage

## 2019-05-15 NOTE — Care Management Important Message (Signed)
Important Message  Patient Details IM Letter given to [facility] Case Manager to present it to the Patient Name: Billy Rasmussen MRN: [id number] Date of Birth: 1946-03-12   Medicare Important Message Given:  Yes     [name] 05/15/2019, 12:42 PM

## 2019-05-15 NOTE — Plan of Care (Signed)
Continue current POC 

## 2019-05-16 LAB — URINE CULTURE: Culture: >=100000 — AB

## 2019-05-16 LAB — RENAL FUNCTION PANEL
Albumin: 3.1 g/dL — ABNORMAL LOW (ref 3.5–5.0)
Anion gap: 9 (ref 5–15)
BUN: 21 mg/dL (ref 8–23)
CO2: 24 mmol/L (ref 22–32)
Calcium: 9.1 mg/dL (ref 8.9–10.3)
Chloride: 103 mmol/L (ref 98–111)
Creatinine, Ser: 1.3 mg/dL — ABNORMAL HIGH (ref 0.61–1.24)
GFR calc Af Amer: >60 mL/min (ref >60)
GFR calc non Af Amer: 55 mL/min — ABNORMAL LOW (ref >60)
Glucose, Bld: 89 mg/dL (ref 70–99)
Phosphorus: 3 mg/dL (ref 2.5–4.6)
Potassium: 4 mmol/L (ref 3.5–5.1)
Sodium: 136 mmol/L (ref 135–145)

## 2019-05-16 LAB — MAGNESIUM: Magnesium: 1.8 mg/dL (ref 1.7–2.4)

## 2019-05-16 MED ORDER — LISINOPRIL 5 MG PO TABS: 5.0000 mg | ORAL_TABLET | Freq: Every day | ORAL | 0 refills | Status: DC

## 2019-05-16 MED ORDER — POLYVINYL ALCOHOL 1.4 % OP SOLN: 1.0000 [drp] | OPHTHALMIC | 0 refills | Status: AC | PRN

## 2019-05-16 MED ORDER — AMLODIPINE BESYLATE 10 MG PO TABS: 10.0000 mg | ORAL_TABLET | Freq: Every day | ORAL | 0 refills | Status: DC

## 2019-05-16 MED ORDER — SENNOSIDES-DOCUSATE SODIUM 8.6-50 MG PO TABS: 1.0000 | ORAL_TABLET | Freq: Every day | ORAL | 0 refills | Status: DC | PRN

## 2019-05-16 MED ORDER — FERROUS SULFATE 325 (65 FE) MG PO TABS: 325.0000 mg | ORAL_TABLET | Freq: Every day | ORAL | 0 refills | Status: DC

## 2019-05-16 MED ORDER — CIPROFLOXACIN HCL 500 MG PO TABS: 500.0000 mg | ORAL_TABLET | Freq: Two times a day (BID) | ORAL | 0 refills | Status: AC

## 2019-05-16 NOTE — Progress Notes (Signed)
Spoke with [facility]. Reviewed what patients does to remove ostomy bag. Per [facility] does not support using iodine, CHG etc prior to replacing ostomy bag due to ostomy bag not adhering appropriately. [facility] nurse suggests utilizing adhesive remover wipes to assist with removing all of the barrier ring patient struggles to remove. [facility] nurse also supported the use of soap and water as best method for cleansing.

## 2019-05-17 ENCOUNTER — Telehealth: Payer: Self-pay | Admitting: Physician Assistant

## 2019-05-17 LAB — CULTURE, BLOOD (ROUTINE X 2)
Culture: NO GROWTH
Culture: NO GROWTH
Special Requests: ADEQUATE
Special Requests: ADEQUATE

## 2019-05-17 NOTE — Telephone Encounter (Signed)
Left message to call back  

## 2019-05-17 NOTE — Telephone Encounter (Signed)
Appointment scheduled 05/22/2019 with Particia Nearing.

## 2019-05-18 ENCOUNTER — Telehealth: Payer: Medicare Other

## 2019-05-19 ENCOUNTER — Other Ambulatory Visit: Payer: Self-pay | Admitting: Physician Assistant

## 2019-05-19 ENCOUNTER — Other Ambulatory Visit: Payer: Self-pay

## 2019-05-19 NOTE — Discharge Summary (Signed)
Physician Discharge Summary  Billy Rasmussen [id number] DOB: 1946/01/09 DOA: 05/12/2019  PCP: Terald Sleeper, PA-C  Admit date: 05/12/2019 Discharge date: 05/19/2019  Admitted From:   Disposition:    Recommendations for Outpatient Follow-up:  1. Follow up with PCP in 1-2 weeks 2. Please obtain BMP/CBC in one week 3. Please follow up on the following pending results:  Home Health:  Equipment/Devices:  Discharge Condition: stable   CODE STATUS: full   Diet recommendation: Heart Healthy     Brief/Interim Summary: 73 year old male with history of bladder cancer status post cystoprostatectomy and conduit diversion with cystostomy, recurrent UTI/pyelonephritis, ESBL Klebsiella, OSA not on CPAP, HTN and GERD presenting with sudden onset of fever, chills, diaphoresis, malaise and fatigue for 1 day. In ED, tachycardic and febrile.WBC 14. Creatinine 1.7. Lactic acid 1.9. UA concerning for UTI. COVID-19 negative. Cultures obtained. Started on IV meropenem for complicated recurrence of UTI and history of ESBL.Marland Kitchen Renal ultrasound with a stable mild to moderate left hydronephrosis. He is improving , remains afebrile.  Patient was admitted, urine cultures grew Pseudomonas and ESBL Klebsiella, Enterococcus faecalis.  He was initially treated with IV meropenem for about 4 days, then later switched on to ciprofloxacin based on sensitivities.  Also his lisinopril was initially held along with HCTZ due to elevated creatinine.  Since his blood pressure started creeping up, lisinopril was slowly introduced at 5 mg which patient tolerated well.  He was also advised to clean the ileal conduit with soap and water along with manufacture recommended methods.  This was reiterated by wound care nurse.  On December 15, patient was feeling fine and did not have any new complaints.  She was discharged home to follow-up with PCP, urology    During his stay   # Sepsis due to complicated UTI in  patient with history of bladder cancer status post proctoscopy cystectomy and ileal conduit diversion with urostomy- pseudomonas and esbl Klebsiella -Patient with history of ESBL Klebsiella UTI and Pseudomonas bacteremia recently. -Sepsis physiology resolving. Blood cultures negative so far -Renal ultrasound with stable mild to moderate left hydronephrosis -Continue IV meropenem -Urine culture grew 100,000 colonies of gram-negative rods ( Pseudomonas) sensitivity pending. -  cx foley, meropenem   aki on CKD-3a: liekly due to acei/ hctz  improved. Renal ultrasound as above.  Chronic left-sided hydroureteronephrosis -Cr1.3-1.5 (baseline)>1.7 (admit)>1.51 -Continue IV fluids -Hold nephrotoxic meds Outpatient follow-up with urology  Recurrent bladder cancer status post cystoprostatectomy and conduit diversion Stable mild to moderate left hydronephrosis -Followed by urology  Essential hypertension: Resume lisinopril at lower dose of 5 mg daily  -Continue holding HCTZ   OSA on CPAP: Reportedly uses 2.5 L by [place] at night. Open to trying CPAP. -We will try nightly CPAP  Leukocytosis/bandemia:Likely due to #1 -Continue monitoring and antibiotics.  Anemia: Combination of IDA and ACD. H&H stable -Recheck anemia panel -Continue monitoring  Class II obesity:BMI 33.41 -Encourage lifestyle change to lose weight.    DVT prophylaxis:Subcu heparin Code Status:Full code Family Communication: Discussed with patient in detail Disposition Plan:Remains inpatient Consultants:Urology  Discharge Diagnoses:  Principal Problem:   Sepsis due to urinary tract infection ([place]) Active Problems:   HTN (hypertension)   Bladder cancer (HCC)   AKI (acute kidney injury) ([place])   OSA (obstructive sleep apnea)    Discharge Instructions  Discharge Instructions    Diet - low sodium heart healthy   Complete by: As directed    Increase activity slowly   Complete by: As  directed  Allergies as of 05/16/2019   No Known Allergies     Medication List    STOP taking these medications   diclofenac 75 MG EC tablet Commonly known as: VOLTAREN   lisinopril-hydrochlorothiazide 20-12.5 MG tablet Commonly known as: ZESTORETIC   meclizine 25 MG tablet Commonly known as: ANTIVERT     TAKE these medications   amLODipine 10 MG tablet Commonly known as: NORVASC Take 1 tablet (10 mg total) by mouth daily.   ciprofloxacin 500 MG tablet Commonly known as: Cipro Take 1 tablet (500 mg total) by mouth 2 (two) times daily for 6 days.   docusate sodium 100 MG capsule Commonly known as: COLACE Take 200 mg by mouth daily.   feeding supplement (ENSURE ENLIVE) Liqd Take 237 mLs by mouth 2 (two) times daily between meals. What changed:   when to take this  reasons to take this   ferrous sulfate 325 (65 FE) MG tablet Take 1 tablet (325 mg total) by mouth daily with breakfast.   lisinopril 5 MG tablet Commonly known as: ZESTRIL Take 1 tablet (5 mg total) by mouth daily.   multivitamin with minerals Tabs tablet Take 1 tablet by mouth daily.   polyvinyl alcohol 1.4 % ophthalmic solution Commonly known as: LIQUIFILM TEARS Place 1 drop into both eyes as needed for dry eyes.   senna-docusate 8.6-50 MG tablet Commonly known as: Senokot-S Take 1 tablet by mouth daily as needed for mild constipation.       No Known Allergies  Consultations:     Procedures/Studies: US RENAL  Result Date: 05/12/2019 CLINICAL DATA:  Acute kidney injury EXAM: RENAL / URINARY TRACT ULTRASOUND COMPLETE COMPARISON:  April 10, 2019 FINDINGS: Right Kidney: Renal measurements: 11.5 x 5.4 x 6.27 = volume: 202 mL. There is no hydronephrosis. There is increased cortical echogenicity. Left Kidney: Renal measurements: 10.3 x 5.9 x 5.6 cm = volume: 146 mL. There is moderate left-sided hydronephrosis. There is increased cortical echogenicity. Bladder: The bladder has been  reportedly removed. Other: None. IMPRESSION: 1. Mild to moderate left-sided hydroureteronephrosis. This is similar in appearance to prior renal ultrasound dated April 10, 2019 2. No right-sided hydronephrosis. 3. Echogenic kidneys bilaterally which can be seen in patients with medical renal disease. Electronically Signed   By: Constance Holster M.D.   On: 05/12/2019 22:27       Subjective:   Discharge Exam: Vitals:   05/16/19 0737 05/16/19 1354  BP: (!) 157/85 129/75  Pulse: 68 79  Resp:  15  Temp: 98.4 F (36.9 C) 98.3 F (36.8 C)  SpO2: 97% 100%   Vitals:   05/15/19 1326 05/15/19 2016 05/16/19 0737 05/16/19 1354  BP: (!) 143/85 (!) 159/87 (!) 157/85 129/75  Pulse: 73 72 68 79  Resp: 18   15  Temp: 98.1 F (36.7 C) 98 F (36.7 C) 98.4 F (36.9 C) 98.3 F (36.8 C)  TempSrc:  Oral  Oral  SpO2: 99% 100% 97% 100%  Weight:      Height:        General: Pt is alert, awake, not in acute distress Cardiovascular: RRR, S1/S2 +, no rubs, no gallops Respiratory: CTA bilaterally, no wheezing, no rhonchi Abdominal: Soft, NT, ND, bowel sounds + Extremities: no edema, no cyanosis    The results of significant diagnostics from this hospitalization (including imaging, microbiology, ancillary and laboratory) are listed below for reference.     Microbiology: Recent Results (from the past 240 hour(s))  Urine culture     Status:  Abnormal   Collection Time: 05/12/19  6:51 PM   Specimen: In/Out Cath Urine  Result Value Ref Range Status   Specimen Description   Final    IN/OUT CATH URINE Performed at [facility], [place] [address] [place], [place] [address]    Special Requests   Final    NONE Performed at [facility], [place] [address], [place], [place] [address]    Culture (A)  Final    >=100,000 COLONIES/mL KLEBSIELLA PNEUMONIAE 70,000 COLONIES/mL PSEUDOMONAS AERUGINOSA 70,000 COLONIES/mL ENTEROCOCCUS FAECALIS ORGANISM 1 Confirmed  Extended Spectrum Beta-Lactamase Producer (ESBL).  In bloodstream infections from ESBL organisms, carbapenems are preferred over piperacillin/tazobactam. They are shown to have a lower risk of mortality.    Report Status 05/16/2019 FINAL  Final   Organism ID, Bacteria KLEBSIELLA PNEUMONIAE (A)  Final   Organism ID, Bacteria PSEUDOMONAS AERUGINOSA (A)  Final   Organism ID, Bacteria ENTEROCOCCUS FAECALIS (A)  Final      Susceptibility   Enterococcus faecalis - MIC*    AMPICILLIN <=2 SENSITIVE Sensitive     NITROFURANTOIN <=16 SENSITIVE Sensitive     VANCOMYCIN 1 SENSITIVE Sensitive     * 70,000 COLONIES/mL ENTEROCOCCUS FAECALIS   Klebsiella pneumoniae - MIC*    AMPICILLIN >=32 RESISTANT Resistant     CEFAZOLIN >=64 RESISTANT Resistant     CEFTRIAXONE >=64 RESISTANT Resistant     CIPROFLOXACIN 1 SENSITIVE Sensitive     GENTAMICIN >=16 RESISTANT Resistant     IMIPENEM <=0.25 SENSITIVE Sensitive     NITROFURANTOIN 64 INTERMEDIATE Intermediate     TRIMETH/SULFA >=320 RESISTANT Resistant     AMPICILLIN/SULBACTAM >=32 RESISTANT Resistant     PIP/TAZO 16 SENSITIVE Sensitive     * >=100,000 COLONIES/mL KLEBSIELLA PNEUMONIAE   Pseudomonas aeruginosa - MIC*    NITROFURANTOIN <=16 SENSITIVE Sensitive     CEFTAZIDIME 4 SENSITIVE Sensitive     CIPROFLOXACIN <=0.25 SENSITIVE Sensitive     GENTAMICIN <=1 SENSITIVE Sensitive     IMIPENEM 1 SENSITIVE Sensitive     PIP/TAZO 8 SENSITIVE Sensitive     CEFEPIME 2 SENSITIVE Sensitive     * 70,000 COLONIES/mL PSEUDOMONAS AERUGINOSA  Blood Culture (routine x 2)     Status: None   Collection Time: 05/12/19  6:52 PM   Specimen: BLOOD  Result Value Ref Range Status   Specimen Description   Final    BLOOD LEFT ANTECUBITAL Performed at [facility], [place] [address] [place], [place] [address]    Special Requests   Final    BOTTLES DRAWN AEROBIC AND ANAEROBIC Blood Culture adequate volume Performed at [place] [address], [place], [place] [address]    Culture   Final    NO GROWTH 5 DAYS Performed at [facility], [place] [address], [place], [place] [address]    Report Status 05/17/2019 FINAL  Final  Blood Culture (routine x 2)     Status: None   Collection Time: 05/12/19  7:00 PM   Specimen: BLOOD LEFT HAND  Result Value Ref Range Status   Specimen Description   Final    BLOOD LEFT HAND Performed at [place] [address] [place] [address] [place], [place] [address]    Special Requests   Final    BOTTLES DRAWN AEROBIC AND ANAEROBIC Blood Culture adequate volume Performed at [place] [address], [place], [place] [address]    Culture   Final    NO  GROWTH 5 DAYS Performed at [facility], [place] [address], [place], [place] [address]    Report Status 05/17/2019 FINAL  Final  SARS CORONAVIRUS 2 (TAT 6-24 HRS) Nasopharyngeal Nasopharyngeal Swab     Status: None   Collection Time: 05/12/19  8:09 PM   Specimen: Nasopharyngeal Swab  Result Value Ref Range Status   SARS Coronavirus 2 NEGATIVE NEGATIVE Final    Comment: (NOTE) SARS-CoV-2 target nucleic acids are NOT DETECTED. The SARS-CoV-2 RNA is generally detectable in upper and lower respiratory specimens during the acute phase of infection. Negative results do not preclude SARS-CoV-2 infection, do not rule out co-infections with other pathogens, and should not be used as the sole basis for treatment or other patient management decisions. Negative results must be combined with clinical observations, patient history, and epidemiological information. The expected result is Negative. Fact Sheet for Patients: SugarRoll.be Fact Sheet for Healthcare Providers: https://www.woods-mathews.com/ This test is not yet approved or cleared by the [place] FDA and  has been authorized for detection and/or diagnosis of SARS-CoV-2 by FDA under an Emergency Use  Authorization (EUA). This EUA will remain  in effect (meaning this test can be used) for the duration of the COVID-19 declaration under Section 56 4(b)(1) of the Act, 21 U.S.C. section 360bbb-3(b)(1), unless the authorization is terminated or revoked sooner. Performed at [facility], [place] [address], [place],  [address]      Labs: BNP (last 3 results) No results for input(s): BNP in the last 8760 hours. Basic Metabolic Panel: Recent Labs  Lab 05/13/19 0349 05/14/19 0514 05/15/19 0642 05/16/19 0608  NA 135 135 137 136  K 4.2 4.1 4.0 4.0  CL 100 103 104 103  CO2 24 24 25 24   GLUCOSE 107* 94 88 89  BUN 27* 21 20 21   CREATININE 1.51* 1.37* 1.20 1.30*  CALCIUM 9.4 9.3 9.4 9.1  MG  --  2.0 1.8 1.8  PHOS  --  2.1*  --  3.0   Liver Function Tests: Recent Labs  Lab 05/14/19 0514 05/15/19 0642 05/16/19 0608  AST  --  22  --   ALT  --  15  --   ALKPHOS  --  55  --   BILITOT  --  0.7  --   PROT  --  7.0  --   ALBUMIN 3.3* 3.1* 3.1*   No results for input(s): LIPASE, AMYLASE in the last 168 hours. No results for input(s): AMMONIA in the last 168 hours. CBC: Recent Labs  Lab 05/13/19 0349 05/14/19 0514 05/15/19 0642  WBC 12.1* 9.2 6.7  HGB 9.9* 9.9* 9.7*  HCT 31.7* 32.6* 30.3*  MCV 88.5 89.8 86.8  PLT 168 181 181   Cardiac Enzymes: No results for input(s): CKTOTAL, CKMB, CKMBINDEX, TROPONINI in the last 168 hours. BNP: Invalid input(s): POCBNP CBG: Recent Labs  Lab 05/13/19 0526  GLUCAP 93   D-Dimer No results for input(s): DDIMER in the last 72 hours. Hgb A1c No results for input(s): HGBA1C in the last 72 hours. Lipid Profile No results for input(s): CHOL, HDL, LDLCALC, TRIG, CHOLHDL, LDLDIRECT in the last 72 hours. Thyroid function studies No results for input(s): TSH, T4TOTAL, T3FREE, THYROIDAB in the last 72 hours.  Invalid input(s): FREET3 Anemia work up No results for input(s): VITAMINB12, FOLATE, FERRITIN, TIBC, IRON, RETICCTPCT in  the last 72 hours. Urinalysis    Component Value Date/Time   COLORURINE AMBER (A) 05/12/2019 1851   APPEARANCEUR CLOUDY (A) 05/12/2019  [place] (A) 01/27/2018 1625   LABSPEC 1.014 05/12/2019 1851   PHURINE 6.0 05/12/2019 1851   GLUCOSEU NEGATIVE 05/12/2019 1851   HGBUR MODERATE (A) 05/12/2019 1851   BILIRUBINUR NEGATIVE 05/12/2019 1851   BILIRUBINUR Negative 01/27/2018 [place] 05/12/2019 1851   PROTEINUR 30 (A) 05/12/2019 1851   NITRITE NEGATIVE 05/12/2019 1851   LEUKOCYTESUR LARGE (A) 05/12/2019 1851   Sepsis Labs Invalid input(s): PROCALCITONIN,  WBC,  LACTICIDVEN Microbiology Recent Results (from the past 240 hour(s))  Urine culture     Status: Abnormal   Collection Time: 05/12/19  6:51 PM   Specimen: In/Out Cath Urine  Result Value Ref Range Status   Specimen Description   Final    IN/OUT CATH URINE Performed at [facility], [place] [address], [place], [place] [address]    Special Requests   Final    NONE Performed at [facility], [place] [address], [place], [place] [address]    Culture (A)  Final    >=100,000 COLONIES/mL KLEBSIELLA PNEUMONIAE 70,000 COLONIES/mL PSEUDOMONAS AERUGINOSA 70,000 COLONIES/mL ENTEROCOCCUS FAECALIS ORGANISM 1 Confirmed Extended Spectrum Beta-Lactamase Producer (ESBL).  In bloodstream infections from ESBL organisms, carbapenems are preferred over piperacillin/tazobactam. They are shown to have a lower risk of mortality.    Report Status 05/16/2019 FINAL  Final   Organism ID, Bacteria KLEBSIELLA PNEUMONIAE (A)  Final   Organism ID, Bacteria PSEUDOMONAS AERUGINOSA (A)  Final   Organism ID, Bacteria ENTEROCOCCUS FAECALIS (A)  Final      Susceptibility   Enterococcus faecalis - MIC*    AMPICILLIN <=2 SENSITIVE Sensitive     NITROFURANTOIN <=16 SENSITIVE Sensitive     VANCOMYCIN 1 SENSITIVE Sensitive     * 70,000 COLONIES/mL ENTEROCOCCUS FAECALIS   Klebsiella pneumoniae -  MIC*    AMPICILLIN >=32 RESISTANT Resistant     CEFAZOLIN >=64 RESISTANT Resistant     CEFTRIAXONE >=64 RESISTANT Resistant     CIPROFLOXACIN 1 SENSITIVE Sensitive     GENTAMICIN >=16 RESISTANT Resistant     IMIPENEM <=0.25 SENSITIVE Sensitive     NITROFURANTOIN 64 INTERMEDIATE Intermediate     TRIMETH/SULFA >=320 RESISTANT Resistant     AMPICILLIN/SULBACTAM >=32 RESISTANT Resistant     PIP/TAZO 16 SENSITIVE Sensitive     * >=100,000 COLONIES/mL KLEBSIELLA PNEUMONIAE   Pseudomonas aeruginosa - MIC*    NITROFURANTOIN <=16 SENSITIVE Sensitive     CEFTAZIDIME 4 SENSITIVE Sensitive     CIPROFLOXACIN <=0.25 SENSITIVE Sensitive     GENTAMICIN <=1 SENSITIVE Sensitive     IMIPENEM 1 SENSITIVE Sensitive     PIP/TAZO 8 SENSITIVE Sensitive     CEFEPIME 2 SENSITIVE Sensitive     * 70,000 COLONIES/mL PSEUDOMONAS AERUGINOSA  Blood Culture (routine x 2)     Status: None   Collection Time: 05/12/19  6:52 PM   Specimen: BLOOD  Result Value Ref Range Status   Specimen Description   Final    BLOOD LEFT ANTECUBITAL Performed at [facility], [place] [address] [place], [place] [address]    Special Requests   Final    BOTTLES DRAWN AEROBIC AND ANAEROBIC Blood Culture adequate volume Performed at [place] [address] [place], [place] [address]    Culture   Final    NO GROWTH 5 DAYS Performed at [facility], [place] [address] [place], [place] [address]    Report Status 05/17/2019 FINAL  Final  Blood Culture (routine x  2)     Status: None   Collection Time: 05/12/19  7:00 PM   Specimen: BLOOD LEFT HAND  Result Value Ref Range Status   Specimen Description   Final    BLOOD LEFT HAND Performed at [place] [address], [place], [place] [address]    Special Requests   Final    BOTTLES DRAWN AEROBIC AND ANAEROBIC Blood Culture adequate volume Performed at [place] [address], [place],  [place] [address]    Culture   Final    NO GROWTH 5 DAYS Performed at [facility], [place] [address], [place], [place] [address]    Report Status 05/17/2019 FINAL  Final  SARS CORONAVIRUS 2 (TAT 6-24 HRS) Nasopharyngeal Nasopharyngeal Swab     Status: None   Collection Time: 05/12/19  8:09 PM   Specimen: Nasopharyngeal Swab  Result Value Ref Range Status   SARS Coronavirus 2 NEGATIVE NEGATIVE Final    Comment: (NOTE) SARS-CoV-2 target nucleic acids are NOT DETECTED. The SARS-CoV-2 RNA is generally detectable in upper and lower respiratory specimens during the acute phase of infection. Negative results do not preclude SARS-CoV-2 infection, do not rule out co-infections with other pathogens, and should not be used as the sole basis for treatment or other patient management decisions. Negative results must be combined with clinical observations, patient history, and epidemiological information. The expected result is Negative. Fact Sheet for Patients: SugarRoll.be Fact Sheet for Healthcare Providers: https://www.woods-mathews.com/ This test is not yet approved or cleared by the [place] FDA and  has been authorized for detection and/or diagnosis of SARS-CoV-2 by FDA under an Emergency Use Authorization (EUA). This EUA will remain  in effect (meaning this test can be used) for the duration of the COVID-19 declaration under Section 56 4(b)(1) of the Act, 21 U.S.C. section 360bbb-3(b)(1), unless the authorization is terminated or revoked sooner. Performed at [facility], [place] [address] [place], [place] [address]      Time coordinating discharge: Over 30 minutes  SIGNED:   Vicenta Dunning, MD  [facility] 05/19/2019, 7:03 PM Pager   If 7PM-7AM, please contact night-coverage [email or web address] Password [email or web address]

## 2019-05-22 ENCOUNTER — Other Ambulatory Visit: Payer: Self-pay

## 2019-05-22 ENCOUNTER — Encounter: Payer: Self-pay | Admitting: Physician Assistant

## 2019-05-22 ENCOUNTER — Ambulatory Visit (INDEPENDENT_AMBULATORY_CARE_PROVIDER_SITE_OTHER): Payer: Medicare Other | Admitting: Physician Assistant

## 2019-05-22 VITALS — BP 120/79 | HR 87 | Temp 98.9°F | Ht 66.0 in | Wt 210.6 lb

## 2019-05-22 DIAGNOSIS — M19019 Primary osteoarthritis, unspecified shoulder: Secondary | ICD-10-CM

## 2019-05-22 DIAGNOSIS — N179 Acute kidney failure, unspecified: Secondary | ICD-10-CM

## 2019-05-22 DIAGNOSIS — N289 Disorder of kidney and ureter, unspecified: Secondary | ICD-10-CM | POA: Diagnosis not present

## 2019-05-22 DIAGNOSIS — N10 Acute tubulo-interstitial nephritis: Secondary | ICD-10-CM

## 2019-05-22 NOTE — Progress Notes (Signed)
BP 120/79   Pulse 87   Temp 98.9 F (37.2 C) (Temporal)   Ht '5\' 6"'$  (1.676 m)   Wt 210 lb 9.6 oz (95.5 kg)   SpO2 96%   BMI 33.99 kg/m    Subjective:    Patient ID: Billy Rasmussen, male    DOB: 05-Mar-1946, 73 y.o.   MRN: [id number]  HPI: Billy Rasmussen is a 73 y.o. male presenting on 05/22/2019 for Hospitalization Follow-up  Brief/Interim Summary: 73 year old male with history of bladder cancer status post cystoprostatectomy and conduit diversion with cystostomy, recurrent UTI/pyelonephritis, ESBL Klebsiella, OSA not on CPAP, HTN and GERD presenting with sudden onset of fever, chills, diaphoresis, malaise and fatigue for 1 day. In ED, tachycardic and febrile.WBC 14. Creatinine 1.7. Lactic acid 1.9. UA concerning for UTI. COVID-19 negative. Cultures obtained. Started on IV meropenem for complicated recurrence of UTI and history of ESBL.Marland Kitchen Renal ultrasound with a stable mild to moderate left hydronephrosis. He is improving , remains afebrile.  Patient was admitted, urine cultures grew Pseudomonas and ESBL Klebsiella, Enterococcus faecalis.  He was initially treated with IV meropenem for about 4 days, then later switched on to ciprofloxacin based on sensitivities.  Also his lisinopril was initially held along with HCTZ due to elevated creatinine.  Since his blood pressure started creeping up, lisinopril was slowly introduced at 5 mg which patient tolerated well.  He was also advised to clean the ileal conduit with soap and water along with manufacture recommended methods.  This was reiterated by wound care nurse.  On December 15, patient was feeling fine and did not have any new complaints.  She was discharged home to follow-up with PCP, urology  This patient comes in for hospital follow-up related to his most recent admission for recurrent urinary tract infection and pyelonephritis.  The summary is listed above.  He was admitted from December 11 to December 15.  He states  that he has been feeling much better this time.  They did some adjustments on his blood pressure medicines.  He was stopped from the lisinopril hydrochlorothiazide and lowered to lisinopril 5 mg but added amlodipine 10 mg 1 daily he states that his morning blood pressures have been up slightly I have encouraged him to give it another week or 2 before we make any changes that sometimes this medicine will need a little bit of time to get the blood pressure lower.  He has been avoiding NSAIDs.  We have discussed that he can try taking 1 a day of his diclofenac on an ear regular basis and then plan to recheck labs in 3 weeks.  Labs were performed today.  Past Medical History:  Diagnosis Date  . Arthritis    knees, shoulders, elbows  . Bladder cancer [facility])    urologist-  dr Junious Silk  . Diverticulosis of colon   . Fibromyalgia   . GERD (gastroesophageal reflux disease)   . History of diverticulitis of colon   . History of gastric ulcer    due to aleve  . Hypertension   . Lower urinary tract symptoms (LUTS)   . OSA (obstructive sleep apnea)    NON-COMPLIANT  CPAP  --- BUT PT USES OXYGEN AT NIGHT 2.5L VIA [place] (PT'S DECISION)  . PONV (postoperative nausea and vomiting)   . Psoriasis   . Tinnitus    right ear more, has tranmitter in right ear removable at hs  . Wears glasses   . Wears partial dentures  Relevant past medical, surgical, family and social history reviewed and updated as indicated. Interim medical history since our last visit reviewed. Allergies and medications reviewed and updated. DATA REVIEWED: CHART IN EPIC  Family History reviewed for pertinent findings.  Review of Systems  Constitutional: Negative.  Negative for appetite change and fatigue.  HENT: Negative.   Eyes: Negative.  Negative for pain and visual disturbance.  Respiratory: Negative.  Negative for cough, chest tightness, shortness of breath and wheezing.   Cardiovascular: Negative.  Negative for chest pain,  palpitations and leg swelling.  Gastrointestinal: Negative.  Negative for abdominal pain, diarrhea, nausea and vomiting.  Endocrine: Negative.   Genitourinary: Negative.   Musculoskeletal: Negative.   Skin: Negative.  Negative for color change and rash.  Neurological: Negative.  Negative for weakness, numbness and headaches.  Psychiatric/Behavioral: Negative.     Allergies as of 05/22/2019   No Known Allergies     Medication List       Accurate as of May 22, 2019  4:54 PM. If you have any questions, ask your nurse or doctor.        STOP taking these medications   feeding supplement (ENSURE ENLIVE) Liqd Stopped by: Billy Sleeper, Billy Rasmussen     TAKE these medications   amLODipine 10 MG tablet Commonly known as: NORVASC Take 1 tablet (10 mg total) by mouth daily.   ciprofloxacin 500 MG tablet Commonly known as: Cipro Take 1 tablet (500 mg total) by mouth 2 (two) times daily for 6 days.   diclofenac 75 MG EC tablet Commonly known as: VOLTAREN TAKE 1 TABLET BY MOUTH TWICE A DAY   docusate sodium 100 MG capsule Commonly known as: COLACE Take 200 mg by mouth daily.   ferrous sulfate 325 (65 FE) MG tablet Take 1 tablet (325 mg total) by mouth daily with breakfast.   lisinopril 5 MG tablet Commonly known as: ZESTRIL Take 1 tablet (5 mg total) by mouth daily.   multivitamin with minerals Tabs tablet Take 1 tablet by mouth daily.   polyvinyl alcohol 1.4 % ophthalmic solution Commonly known as: LIQUIFILM TEARS Place 1 drop into both eyes as needed for dry eyes.   senna-docusate 8.6-50 MG tablet Commonly known as: Senokot-S Take 1 tablet by mouth daily as needed for mild constipation.      Physical Exam  Constitutional: He appears well-developed and well-nourished. No distress.  Cardiovascular: Normal rate and regular rhythm.  Respiratory: Effort normal and breath sounds normal.  GI: Soft. Bowel sounds are normal.  Musculoskeletal:        General: Normal range  of motion.  Skin: Skin is dry.       Objective:    BP 120/79   Pulse 87   Temp 98.9 F (37.2 C) (Temporal)   Ht '5\' 6"'$  (1.676 m)   Wt 210 lb 9.6 oz (95.5 kg)   SpO2 96%   BMI 33.99 kg/m   No Known Allergies  Wt Readings from Last 3 Encounters:  05/22/19 210 lb 9.6 oz (95.5 kg)  05/12/19 207 lb (93.9 kg)  04/13/19 202 lb 2.6 oz (91.7 kg)        Assessment & Plan:   1. Abnormal renal function - CMP14+EGFR - CBC with Differential/Platelet  2. Shoulder arthritis - CMP14+EGFR - CBC with Differential/Platelet  3. AKI (acute kidney injury) ([place]) - CMP14+EGFR - CBC with Differential/Platelet  4. Acute pyelonephritis - CMP14+EGFR - CBC with Differential/Platelet    Continue all other maintenance medications as listed  above.  Follow up plan: Return in about 3 months (around 08/20/2019) for also NEEDS LABS IN 3 WEEKS, appt please.  Educational handout given for [place] Billy Rasmussen [place] [address]  [place], [place] [address] [phone]   05/22/2019, 4:54 PM

## 2019-05-23 ENCOUNTER — Telehealth: Payer: Medicare Other | Admitting: *Deleted

## 2019-05-23 LAB — CMP14+EGFR
ALT: 26 IU/L (ref 0–44)
AST: 40 IU/L (ref 0–40)
Albumin/Globulin Ratio: 1.2 (ref 1.2–2.2)
Albumin: 4.2 g/dL (ref 3.7–4.7)
Alkaline Phosphatase: 122 IU/L — ABNORMAL HIGH (ref 39–117)
BUN/Creatinine Ratio: 13 (ref 10–24)
BUN: 17 mg/dL (ref 8–27)
Bilirubin Total: 0.3 mg/dL (ref 0.0–1.2)
CO2: 22 mmol/L (ref 20–29)
Calcium: 9.6 mg/dL (ref 8.6–10.2)
Chloride: 101 mmol/L (ref 96–106)
Creatinine, Ser: 1.33 mg/dL — ABNORMAL HIGH (ref 0.76–1.27)
GFR calc Af Amer: 62 mL/min/{1.73_m2} (ref >59)
GFR calc non Af Amer: 53 mL/min/{1.73_m2} — ABNORMAL LOW (ref >59)
Globulin, Total: 3.5 g/dL (ref 1.5–4.5)
Glucose: 103 mg/dL — ABNORMAL HIGH (ref 65–99)
Potassium: 4.6 mmol/L (ref 3.5–5.2)
Sodium: 140 mmol/L (ref 134–144)
Total Protein: 7.7 g/dL (ref 6.0–8.5)

## 2019-05-23 LAB — CBC WITH DIFFERENTIAL/PLATELET
Basophils Absolute: 0 10*3/uL (ref 0.0–0.2)
Basos: 0 %
EOS (ABSOLUTE): 0.3 10*3/uL (ref 0.0–0.4)
Eos: 4 %
Hematocrit: 33.8 % — ABNORMAL LOW (ref 37.5–51.0)
Hemoglobin: 11 g/dL — ABNORMAL LOW (ref 13.0–17.7)
Immature Grans (Abs): 0.1 10*3/uL (ref 0.0–0.1)
Immature Granulocytes: 1 %
Lymphocytes Absolute: 1.6 10*3/uL (ref 0.7–3.1)
Lymphs: 23 %
MCH: 27.6 pg (ref 26.6–33.0)
MCHC: 32.5 g/dL (ref 31.5–35.7)
MCV: 85 fL (ref 79–97)
Monocytes Absolute: 0.5 10*3/uL (ref 0.1–0.9)
Monocytes: 8 %
Neutrophils Absolute: 4.5 10*3/uL (ref 1.4–7.0)
Neutrophils: 64 %
Platelets: 340 10*3/uL (ref 150–450)
RBC: 3.99 x10E6/uL — ABNORMAL LOW (ref 4.14–5.80)
RDW: 16 % — ABNORMAL HIGH (ref 11.6–15.4)
WBC: 7.1 10*3/uL (ref 3.4–10.8)

## 2019-05-24 ENCOUNTER — Other Ambulatory Visit: Payer: Self-pay | Admitting: *Deleted

## 2019-05-24 ENCOUNTER — Telehealth: Payer: Medicare Other | Admitting: *Deleted

## 2019-05-24 MED ORDER — LISINOPRIL 5 MG PO TABS: 5.0000 mg | ORAL_TABLET | Freq: Every day | ORAL | 0 refills | Status: DC

## 2019-05-24 MED ORDER — AMLODIPINE BESYLATE 10 MG PO TABS: 10.0000 mg | ORAL_TABLET | Freq: Every day | ORAL | 0 refills | Status: DC

## 2019-05-30 ENCOUNTER — Ambulatory Visit: Payer: Medicare Other | Admitting: *Deleted

## 2019-05-30 ENCOUNTER — Other Ambulatory Visit: Payer: Self-pay | Admitting: Cardiovascular Disease

## 2019-05-30 DIAGNOSIS — C679 Malignant neoplasm of bladder, unspecified: Secondary | ICD-10-CM

## 2019-05-30 DIAGNOSIS — E782 Mixed hyperlipidemia: Secondary | ICD-10-CM

## 2019-05-30 MED ORDER — ATORVASTATIN CALCIUM 20 MG PO TABS: 20.0000 mg | ORAL_TABLET | Freq: Every day | ORAL | 3 refills | Status: DC

## 2019-05-30 NOTE — Patient Instructions (Signed)
[name] was given information about Chronic Care Management services today including:  1. CCM service includes personalized support from designated clinical staff supervised by his physician, including individualized plan of care and coordination with other care providers 2. 24/7 contact phone numbers for assistance for urgent and routine care needs. 3. Service will only be billed when office clinical staff spend 20 minutes or more in a month to coordinate care. 4. Only one practitioner may furnish and bill the service in a calendar month. 5. The patient may stop CCM services at any time (effective at the end of the month) by phone call to the office staff. 6. The patient will be responsible for cost sharing (co-pay) of up to 20% of the service fee (after annual deductible is met).  Patient did not agree to services and wishes to consider information provided before deciding about enrollment in care management services.    Follow-up Plan: The care management team will reach out to the patient again over the next 45 days.   Chong Sicilian, BSN, RN-BC Embedded Chronic Care Manager [facility] [place] [facility] Direct Dial: [phone]

## 2019-05-30 NOTE — Chronic Care Management (AMB) (Signed)
  Care Management   Initial Outreach  05/30/2019 Name: Billy Rasmussen MRN: [id number] DOB: 1945/06/11  Referred by: Terald Sleeper, PA-C Reason for referral : Chronic Care Management (Initial RN Outreach)   Billy Rasmussen is a 73 y.o. year old male who is a primary care patient of Terald Sleeper, PA-C. The CCM team was consulted for assistance with chronic disease management and care coordination needs related to HTN, OSA, Arthritis, bladder cancer, hyperlipidemia.  Review of patient status, including review of consultants reports, relevant laboratory and other test results, and collaboration with appropriate care team members and the patient's provider was performed as part of comprehensive patient evaluation and provision of chronic care management services.    Billy Rasmussen has had 6 ED to hospital admissions since June due to UTI and sepsis due to UTI. He does have recurrent bladder cancer and is seeing his urologist regularly. His next visit is in 2 days. We also discussed his anemia and hyperlipidemia.     Billy. Rasmussen was given information about Chronic Care Management services today including:  1. CCM service includes personalized support from designated clinical staff supervised by his physician, including individualized plan of care and coordination with other care providers 2. 24/7 contact phone numbers for assistance for urgent and routine care needs. 3. Service will only be billed when office clinical staff spend 20 minutes or more in a month to coordinate care. 4. Only one practitioner may furnish and bill the service in a calendar month. 5. The patient may stop CCM services at any time (effective at the end of the month) by phone call to the office staff. 6. The patient will be responsible for cost sharing (co-pay) of up to 20% of the service fee (after annual deductible is met).  Patient did not agree to services and wishes to consider information provided before deciding about  enrollment in care management services.   Follow-up Plan: The care management team will reach out to the patient again over the next 45 days.   Chong Sicilian, BSN, RN-BC Embedded Chronic Care Manager [facility] [place] [facility] Direct Dial: [phone]

## 2019-06-06 ENCOUNTER — Other Ambulatory Visit: Payer: Self-pay | Admitting: Physician Assistant

## 2019-06-06 DIAGNOSIS — C678 Malignant neoplasm of overlapping sites of bladder: Secondary | ICD-10-CM | POA: Diagnosis not present

## 2019-06-06 DIAGNOSIS — N130 Hydronephrosis with ureteropelvic junction obstruction: Secondary | ICD-10-CM | POA: Diagnosis not present

## 2019-06-06 DIAGNOSIS — Z936 Other artificial openings of urinary tract status: Secondary | ICD-10-CM | POA: Diagnosis not present

## 2019-06-06 DIAGNOSIS — N10 Acute tubulo-interstitial nephritis: Secondary | ICD-10-CM | POA: Diagnosis not present

## 2019-06-06 NOTE — Telephone Encounter (Signed)
Closing encounter, pt does not want to change Rx at this time

## 2019-06-07 ENCOUNTER — Other Ambulatory Visit: Payer: Self-pay | Admitting: Urology

## 2019-06-07 ENCOUNTER — Other Ambulatory Visit (HOSPITAL_COMMUNITY): Payer: Self-pay | Admitting: Urology

## 2019-06-07 DIAGNOSIS — N130 Hydronephrosis with ureteropelvic junction obstruction: Secondary | ICD-10-CM

## 2019-06-09 ENCOUNTER — Other Ambulatory Visit: Payer: Self-pay

## 2019-06-12 ENCOUNTER — Telehealth: Payer: Self-pay | Admitting: Physician Assistant

## 2019-06-12 ENCOUNTER — Other Ambulatory Visit: Payer: Medicare Other

## 2019-06-12 ENCOUNTER — Other Ambulatory Visit: Payer: Self-pay

## 2019-06-12 DIAGNOSIS — E782 Mixed hyperlipidemia: Secondary | ICD-10-CM

## 2019-06-12 MED ORDER — LISINOPRIL 5 MG PO TABS: 5.0000 mg | ORAL_TABLET | Freq: Every day | ORAL | 1 refills | Status: DC

## 2019-06-12 NOTE — Telephone Encounter (Signed)
What is the name of the medication? Lisinopril 5 mg  Have you contacted your pharmacy to request a refill? Yes  Which pharmacy would you like this sent to? [facility]   Patient notified that their request is being sent to the clinical staff for review and that they should receive a call once it is complete. If they do not receive a call within 24 hours they can check with their pharmacy or our office.   It shows on MyChart, but it is not there. Ronnald Ramp' pt.

## 2019-06-12 NOTE — Telephone Encounter (Signed)
Med sent.

## 2019-06-13 LAB — LIPID PANEL
Chol/HDL Ratio: 5.9 ratio — ABNORMAL HIGH (ref 0.0–5.0)
Cholesterol, Total: 164 mg/dL (ref 100–199)
HDL: 28 mg/dL — ABNORMAL LOW (ref >39)
LDL Chol Calc (NIH): 114 mg/dL — ABNORMAL HIGH (ref 0–99)
Triglycerides: 118 mg/dL (ref 0–149)
VLDL Cholesterol Cal: 22 mg/dL (ref 5–40)

## 2019-06-19 ENCOUNTER — Other Ambulatory Visit: Payer: Self-pay

## 2019-06-19 DIAGNOSIS — E782 Mixed hyperlipidemia: Secondary | ICD-10-CM

## 2019-06-19 MED ORDER — ROSUVASTATIN CALCIUM 10 MG PO TABS: 10.0000 mg | ORAL_TABLET | Freq: Every day | ORAL | 3 refills | Status: DC

## 2019-06-23 ENCOUNTER — Other Ambulatory Visit: Payer: Self-pay

## 2019-06-23 ENCOUNTER — Ambulatory Visit (HOSPITAL_COMMUNITY)
Admission: RE | Admit: 2019-06-23 | Discharge: 2019-06-23 | Disposition: A | Discharge status: Home / Self Care | Payer: Medicare Other | Source: Ambulatory Visit | Attending: Urology | Admitting: Urology

## 2019-06-23 ENCOUNTER — Telehealth: Payer: Medicare Other

## 2019-06-23 DIAGNOSIS — R509 Fever, unspecified: Secondary | ICD-10-CM | POA: Diagnosis not present

## 2019-06-23 DIAGNOSIS — Z20822 Contact with and (suspected) exposure to covid-19: Secondary | ICD-10-CM | POA: Diagnosis not present

## 2019-06-23 DIAGNOSIS — I10 Essential (primary) hypertension: Secondary | ICD-10-CM | POA: Diagnosis not present

## 2019-06-23 DIAGNOSIS — A419 Sepsis, unspecified organism: Secondary | ICD-10-CM | POA: Diagnosis not present

## 2019-06-23 DIAGNOSIS — N390 Urinary tract infection, site not specified: Secondary | ICD-10-CM | POA: Diagnosis not present

## 2019-06-23 DIAGNOSIS — T83510A Infection and inflammatory reaction due to cystostomy catheter, initial encounter: Secondary | ICD-10-CM | POA: Diagnosis not present

## 2019-06-23 DIAGNOSIS — N130 Hydronephrosis with ureteropelvic junction obstruction: Secondary | ICD-10-CM

## 2019-06-23 DIAGNOSIS — N132 Hydronephrosis with renal and ureteral calculous obstruction: Secondary | ICD-10-CM | POA: Diagnosis not present

## 2019-06-23 DIAGNOSIS — N179 Acute kidney failure, unspecified: Secondary | ICD-10-CM | POA: Diagnosis not present

## 2019-06-23 DIAGNOSIS — E871 Hypo-osmolality and hyponatremia: Secondary | ICD-10-CM | POA: Diagnosis not present

## 2019-06-23 DIAGNOSIS — N136 Pyonephrosis: Secondary | ICD-10-CM | POA: Diagnosis not present

## 2019-06-23 MED ORDER — FUROSEMIDE 10 MG/ML IJ SOLN
INTRAMUSCULAR | Status: AC
  Administered 2019-06-23: 15:00:00 48 mg via INTRAVENOUS
  Filled 2019-06-23: qty 8

## 2019-06-23 MED ORDER — TECHNETIUM TC 99M MERTIATIDE
5.0500 | Freq: Once | INTRAVENOUS | Status: AC
  Administered 2019-06-23: 14:00:00 5.05 via INTRAVENOUS

## 2019-06-23 MED ORDER — FUROSEMIDE 10 MG/ML IJ SOLN: 48.0000 mg | Freq: Once | INTRAMUSCULAR | Status: AC

## 2019-06-25 ENCOUNTER — Other Ambulatory Visit: Payer: Self-pay

## 2019-06-25 ENCOUNTER — Emergency Department (HOSPITAL_COMMUNITY): Payer: Medicare Other

## 2019-06-25 ENCOUNTER — Encounter (HOSPITAL_COMMUNITY): Payer: Self-pay

## 2019-06-25 ENCOUNTER — Inpatient Hospital Stay (HOSPITAL_COMMUNITY)
Admission: EM | Admit: 2019-06-25 | Discharge: 2019-06-28 | DRG: 698 | Disposition: A | Discharge status: Home / Self Care | Payer: Medicare Other | Attending: Family Medicine | Admitting: Family Medicine

## 2019-06-25 DIAGNOSIS — Z87440 Personal history of urinary (tract) infections: Secondary | ICD-10-CM

## 2019-06-25 DIAGNOSIS — N136 Pyonephrosis: Secondary | ICD-10-CM | POA: Diagnosis present

## 2019-06-25 DIAGNOSIS — Z20822 Contact with and (suspected) exposure to covid-19: Secondary | ICD-10-CM | POA: Diagnosis present

## 2019-06-25 DIAGNOSIS — N390 Urinary tract infection, site not specified: Secondary | ICD-10-CM | POA: Diagnosis not present

## 2019-06-25 DIAGNOSIS — A419 Sepsis, unspecified organism: Secondary | ICD-10-CM | POA: Diagnosis present

## 2019-06-25 DIAGNOSIS — L409 Psoriasis, unspecified: Secondary | ICD-10-CM | POA: Diagnosis present

## 2019-06-25 DIAGNOSIS — E871 Hypo-osmolality and hyponatremia: Secondary | ICD-10-CM | POA: Diagnosis present

## 2019-06-25 DIAGNOSIS — Z87891 Personal history of nicotine dependence: Secondary | ICD-10-CM | POA: Diagnosis not present

## 2019-06-25 DIAGNOSIS — M797 Fibromyalgia: Secondary | ICD-10-CM | POA: Diagnosis present

## 2019-06-25 DIAGNOSIS — Z8551 Personal history of malignant neoplasm of bladder: Secondary | ICD-10-CM | POA: Diagnosis not present

## 2019-06-25 DIAGNOSIS — N179 Acute kidney failure, unspecified: Secondary | ICD-10-CM | POA: Diagnosis present

## 2019-06-25 DIAGNOSIS — G4733 Obstructive sleep apnea (adult) (pediatric): Secondary | ICD-10-CM | POA: Diagnosis present

## 2019-06-25 DIAGNOSIS — K5730 Diverticulosis of large intestine without perforation or abscess without bleeding: Secondary | ICD-10-CM | POA: Diagnosis present

## 2019-06-25 DIAGNOSIS — M170 Bilateral primary osteoarthritis of knee: Secondary | ICD-10-CM | POA: Diagnosis present

## 2019-06-25 DIAGNOSIS — N261 Atrophy of kidney (terminal): Secondary | ICD-10-CM | POA: Diagnosis not present

## 2019-06-25 DIAGNOSIS — C678 Malignant neoplasm of overlapping sites of bladder: Secondary | ICD-10-CM | POA: Diagnosis not present

## 2019-06-25 DIAGNOSIS — Y738 Miscellaneous gastroenterology and urology devices associated with adverse incidents, not elsewhere classified: Secondary | ICD-10-CM | POA: Diagnosis present

## 2019-06-25 DIAGNOSIS — Z96611 Presence of right artificial shoulder joint: Secondary | ICD-10-CM | POA: Diagnosis present

## 2019-06-25 DIAGNOSIS — Z9119 Patient's noncompliance with other medical treatment and regimen: Secondary | ICD-10-CM | POA: Diagnosis not present

## 2019-06-25 DIAGNOSIS — Z96653 Presence of artificial knee joint, bilateral: Secondary | ICD-10-CM | POA: Diagnosis present

## 2019-06-25 DIAGNOSIS — C679 Malignant neoplasm of bladder, unspecified: Secondary | ICD-10-CM | POA: Diagnosis present

## 2019-06-25 DIAGNOSIS — K219 Gastro-esophageal reflux disease without esophagitis: Secondary | ICD-10-CM | POA: Diagnosis present

## 2019-06-25 DIAGNOSIS — N10 Acute tubulo-interstitial nephritis: Secondary | ICD-10-CM | POA: Diagnosis not present

## 2019-06-25 DIAGNOSIS — T83510A Infection and inflammatory reaction due to cystostomy catheter, initial encounter: Principal | ICD-10-CM | POA: Diagnosis present

## 2019-06-25 DIAGNOSIS — Z96612 Presence of left artificial shoulder joint: Secondary | ICD-10-CM | POA: Diagnosis present

## 2019-06-25 DIAGNOSIS — N1330 Unspecified hydronephrosis: Secondary | ICD-10-CM | POA: Diagnosis not present

## 2019-06-25 DIAGNOSIS — I10 Essential (primary) hypertension: Secondary | ICD-10-CM | POA: Diagnosis present

## 2019-06-25 DIAGNOSIS — D696 Thrombocytopenia, unspecified: Secondary | ICD-10-CM | POA: Diagnosis present

## 2019-06-25 DIAGNOSIS — R509 Fever, unspecified: Secondary | ICD-10-CM | POA: Diagnosis not present

## 2019-06-25 DIAGNOSIS — Z833 Family history of diabetes mellitus: Secondary | ICD-10-CM | POA: Diagnosis not present

## 2019-06-25 DIAGNOSIS — Z8249 Family history of ischemic heart disease and other diseases of the circulatory system: Secondary | ICD-10-CM

## 2019-06-25 DIAGNOSIS — M19022 Primary osteoarthritis, left elbow: Secondary | ICD-10-CM | POA: Diagnosis present

## 2019-06-25 DIAGNOSIS — M19021 Primary osteoarthritis, right elbow: Secondary | ICD-10-CM | POA: Diagnosis present

## 2019-06-25 DIAGNOSIS — Z820 Family history of epilepsy and other diseases of the nervous system: Secondary | ICD-10-CM

## 2019-06-25 LAB — URINALYSIS, ROUTINE W REFLEX MICROSCOPIC
Bilirubin Urine: NEGATIVE
Glucose, UA: NEGATIVE mg/dL
Ketones, ur: NEGATIVE mg/dL
Nitrite: POSITIVE — AB
Protein, ur: 30 mg/dL — AB
Specific Gravity, Urine: 1.012 (ref 1.005–1.030)
WBC, UA: >50 WBC/hpf — ABNORMAL HIGH (ref 0–5)
pH: 7 (ref 5.0–8.0)

## 2019-06-25 LAB — CBC WITH DIFFERENTIAL/PLATELET
Abs Immature Granulocytes: 0.04 10*3/uL (ref 0.00–0.07)
Basophils Absolute: 0 10*3/uL (ref 0.0–0.1)
Basophils Relative: 0 %
Eosinophils Absolute: 0.1 10*3/uL (ref 0.0–0.5)
Eosinophils Relative: 1 %
HCT: 34.6 % — ABNORMAL LOW (ref 39.0–52.0)
Hemoglobin: 11 g/dL — ABNORMAL LOW (ref 13.0–17.0)
Immature Granulocytes: 0 %
Lymphocytes Relative: 9 %
Lymphs Abs: 1 10*3/uL (ref 0.7–4.0)
MCH: 27.8 pg (ref 26.0–34.0)
MCHC: 31.8 g/dL (ref 30.0–36.0)
MCV: 87.6 fL (ref 80.0–100.0)
Monocytes Absolute: 1.1 10*3/uL — ABNORMAL HIGH (ref 0.1–1.0)
Monocytes Relative: 10 %
Neutro Abs: 8.8 10*3/uL — ABNORMAL HIGH (ref 1.7–7.7)
Neutrophils Relative %: 80 %
Platelets: 163 10*3/uL (ref 150–400)
RBC: 3.95 MIL/uL — ABNORMAL LOW (ref 4.22–5.81)
RDW: 16.3 % — ABNORMAL HIGH (ref 11.5–15.5)
WBC: 11.1 10*3/uL — ABNORMAL HIGH (ref 4.0–10.5)
nRBC: 0 % (ref 0.0–0.2)

## 2019-06-25 LAB — COMPREHENSIVE METABOLIC PANEL
ALT: 18 U/L (ref 0–44)
AST: 26 U/L (ref 15–41)
Albumin: 4.1 g/dL (ref 3.5–5.0)
Alkaline Phosphatase: 64 U/L (ref 38–126)
Anion gap: 10 (ref 5–15)
BUN: 27 mg/dL — ABNORMAL HIGH (ref 8–23)
CO2: 24 mmol/L (ref 22–32)
Calcium: 9.8 mg/dL (ref 8.9–10.3)
Chloride: 98 mmol/L (ref 98–111)
Creatinine, Ser: 1.5 mg/dL — ABNORMAL HIGH (ref 0.61–1.24)
GFR calc Af Amer: 54 mL/min — ABNORMAL LOW (ref >60)
GFR calc non Af Amer: 46 mL/min — ABNORMAL LOW (ref >60)
Glucose, Bld: 113 mg/dL — ABNORMAL HIGH (ref 70–99)
Potassium: 4.2 mmol/L (ref 3.5–5.1)
Sodium: 132 mmol/L — ABNORMAL LOW (ref 135–145)
Total Bilirubin: 1.2 mg/dL (ref 0.3–1.2)
Total Protein: 8.2 g/dL — ABNORMAL HIGH (ref 6.5–8.1)

## 2019-06-25 LAB — LACTIC ACID, PLASMA: Lactic Acid, Venous: 0.8 mmol/L (ref 0.5–1.9)

## 2019-06-25 LAB — APTT: aPTT: 42 seconds — ABNORMAL HIGH (ref 24–36)

## 2019-06-25 LAB — PROTIME-INR
INR: 1.3 — ABNORMAL HIGH (ref 0.8–1.2)
Prothrombin Time: 16 seconds — ABNORMAL HIGH (ref 11.4–15.2)

## 2019-06-25 MED ORDER — VANCOMYCIN HCL IN DEXTROSE 1-5 GM/200ML-% IV SOLN
1000.0000 mg | INTRAVENOUS | Status: DC
  Administered 2019-06-26 – 2019-06-27 (×2): 1000 mg via INTRAVENOUS
  Filled 2019-06-25 (×2): qty 200

## 2019-06-25 MED ORDER — PIPERACILLIN-TAZOBACTAM 3.375 G IVPB 30 MIN: 3.3750 g | Freq: Once | INTRAVENOUS | Status: DC

## 2019-06-25 MED ORDER — ENOXAPARIN SODIUM 40 MG/0.4ML ~~LOC~~ SOLN
40.0000 mg | Freq: Every day | SUBCUTANEOUS | Status: DC
  Administered 2019-06-25 – 2019-06-27 (×3): 40 mg via SUBCUTANEOUS
  Filled 2019-06-25 (×3): qty 0.4

## 2019-06-25 MED ORDER — ONDANSETRON HCL 4 MG PO TABS: 4.0000 mg | ORAL_TABLET | Freq: Four times a day (QID) | ORAL | Status: DC | PRN

## 2019-06-25 MED ORDER — SODIUM CHLORIDE 0.9 % IV SOLN
1.0000 g | Freq: Two times a day (BID) | INTRAVENOUS | Status: DC
  Administered 2019-06-26: 1 g via INTRAVENOUS
  Filled 2019-06-25: qty 1

## 2019-06-25 MED ORDER — SODIUM CHLORIDE 0.9 % IV SOLN
1.0000 g | Freq: Two times a day (BID) | INTRAVENOUS | Status: DC
  Filled 2019-06-25: qty 1

## 2019-06-25 MED ORDER — ROSUVASTATIN CALCIUM 10 MG PO TABS
10.0000 mg | ORAL_TABLET | Freq: Every day | ORAL | Status: DC
  Administered 2019-06-26 – 2019-06-27 (×2): 10 mg via ORAL
  Filled 2019-06-25 (×2): qty 1

## 2019-06-25 MED ORDER — ONDANSETRON HCL 4 MG/2ML IJ SOLN: 4.0000 mg | Freq: Four times a day (QID) | INTRAMUSCULAR | Status: DC | PRN

## 2019-06-25 MED ORDER — ACETAMINOPHEN 325 MG PO TABS
650.0000 mg | ORAL_TABLET | Freq: Four times a day (QID) | ORAL | Status: DC | PRN
  Administered 2019-06-26 (×2): 650 mg via ORAL
  Filled 2019-06-25 (×2): qty 2

## 2019-06-25 MED ORDER — AMLODIPINE BESYLATE 10 MG PO TABS
10.0000 mg | ORAL_TABLET | Freq: Every day | ORAL | Status: DC
  Administered 2019-06-26 – 2019-06-28 (×3): 10 mg via ORAL
  Filled 2019-06-25 (×3): qty 1

## 2019-06-25 MED ORDER — SODIUM CHLORIDE 0.9% FLUSH: 3.0000 mL | INTRAVENOUS | Status: DC | PRN

## 2019-06-25 MED ORDER — VANCOMYCIN HCL 2000 MG/400ML IV SOLN
2000.0000 mg | INTRAVENOUS | Status: AC
  Administered 2019-06-25: 2000 mg via INTRAVENOUS
  Filled 2019-06-25: qty 400

## 2019-06-25 MED ORDER — ADULT MULTIVITAMIN W/MINERALS CH
1.0000 | ORAL_TABLET | Freq: Every day | ORAL | Status: DC
  Administered 2019-06-26 – 2019-06-28 (×3): 1 via ORAL
  Filled 2019-06-25 (×3): qty 1

## 2019-06-25 MED ORDER — POLYVINYL ALCOHOL 1.4 % OP SOLN: 1.0000 [drp] | OPHTHALMIC | Status: DC | PRN

## 2019-06-25 MED ORDER — SODIUM CHLORIDE 0.9 % IV BOLUS
1000.0000 mL | Freq: Once | INTRAVENOUS | Status: AC
  Administered 2019-06-25: 1000 mL via INTRAVENOUS

## 2019-06-25 MED ORDER — SODIUM CHLORIDE 0.9% FLUSH
3.0000 mL | Freq: Two times a day (BID) | INTRAVENOUS | Status: DC
  Administered 2019-06-25 – 2019-06-27 (×5): 3 mL via INTRAVENOUS

## 2019-06-25 MED ORDER — ACETAMINOPHEN 650 MG RE SUPP: 650.0000 mg | Freq: Four times a day (QID) | RECTAL | Status: DC | PRN

## 2019-06-25 MED ORDER — SENNOSIDES-DOCUSATE SODIUM 8.6-50 MG PO TABS: 1.0000 | ORAL_TABLET | Freq: Every day | ORAL | Status: DC | PRN

## 2019-06-25 MED ORDER — SODIUM CHLORIDE 0.9 % IV SOLN: 250.0000 mL | INTRAVENOUS | Status: DC | PRN

## 2019-06-25 MED ORDER — ACETAMINOPHEN 325 MG PO TABS
650.0000 mg | ORAL_TABLET | Freq: Once | ORAL | Status: AC
  Administered 2019-06-25: 650 mg via ORAL
  Filled 2019-06-25: qty 2

## 2019-06-25 MED ORDER — SODIUM CHLORIDE 0.9 % IV SOLN
1.0000 g | INTRAVENOUS | Status: AC
  Administered 2019-06-25: 1 g via INTRAVENOUS
  Filled 2019-06-25: qty 1

## 2019-06-25 NOTE — ED Triage Notes (Signed)
Patient reports having a left kidney infection and fever at home.   104.0 at home  Urostomy bag place in June 2020  Patient reports having appointment tomorrow for blood work and to talk about removing kidney  C/o mid lower back pain

## 2019-06-25 NOTE — Progress Notes (Signed)
Pharmacy Antibiotic Note  Billy Rasmussen is a 74 y.o. male admitted on 06/25/2019 with bacteremia.  Pharmacy has been consulted for meropenem and vancomycin dosing.  Plan: Meropenem 1 Gm IV q12h Vancomycin 2 Gm x1 then 1 Gm IV q24h for est AUC = 545 Use scr = 1.5, Vd = 0.5 Goal AUC = 400-550 F/u scr/cultures/levels  Height: 5\' 6"  (167.6 cm) Weight: 210 lb (95.3 kg) IBW/kg (Calculated) : 63.8  Temp (24hrs), Avg:100.1 F (37.8 C), Min:100.1 F (37.8 C), Max:100.1 F (37.8 C)  Recent Labs  Lab 06/25/19 1838  WBC 11.1*  CREATININE 1.50*  LATICACIDVEN 0.8    Estimated Creatinine Clearance: 48.8 mL/min (A) (by C-G formula based on SCr of 1.5 mg/dL (H)).    No Known Allergies  Antimicrobials this admission: 1/24 meropenem >>  1/24 vancomycin >>   Dose adjustments this admission:   Microbiology results:  BCx:   UCx:    Sputum:    MRSA PCR:   Thank you for allowing pharmacy to be a part of this patient's care.  Dorrene German 06/25/2019 9:38 PM

## 2019-06-25 NOTE — Progress Notes (Addendum)
A consult was received from an ED physician for Vancomycin per pharmacy dosing.  The patient's profile has been reviewed for ht/wt/allergies/indication/available labs. Last weight documented from 05/22/2019 = 95.5 kg.   A one time order has been placed for Vancomycin 2g IV.  Further antibiotics/pharmacy consults should be ordered by admitting physician if indicated.                       Thank you, Luiz Ochoa 06/25/2019  7:11 PM

## 2019-06-25 NOTE — H&P (Signed)
History and Physical    Billy Rasmussen [id number] DOB: 06/17/1945 DOA: 06/25/2019  PCP: Terald Sleeper, PA-C  Patient coming from: Home  Chief Complaint: Fever  HPI: Billy Rasmussen is a 74 y.o. male with medical history significant of bladder cancer status post resection and urostomy, fibromyalgia, hypertension, frequent UTIs comes in with sudden fevers today.  Denies any issues with his urine.  Denies any shortness of breath cough.  Denies any respiratory symptoms.  Does not have any nausea vomiting or diarrhea.  Denies any abdominal pain.  Just had a high fever of 104.  Patient has a bladder infection has a history of multidrug-resistant organisms in the past.  Blood pressures been stable.  Feels better with IV fluids.  Patient be referred for admission for complicated urine tract infection.  Has been given vancomycin and meropenem.  Review of Systems: As per HPI otherwise 10 point review of systems negative.   Past Medical History:  Diagnosis Date  . Arthritis    knees, shoulders, elbows  . Bladder cancer [facility])    urologist-  dr Junious Silk  . Diverticulosis of colon   . Fibromyalgia   . GERD (gastroesophageal reflux disease)   . History of diverticulitis of colon   . History of gastric ulcer    due to aleve  . Hypertension   . Lower urinary tract symptoms (LUTS)   . OSA (obstructive sleep apnea)    NON-COMPLIANT  CPAP  --- BUT PT USES OXYGEN AT NIGHT 2.5L VIA [place] (PT'S DECISION)  . PONV (postoperative nausea and vomiting)   . Psoriasis   . Tinnitus    right ear more, has tranmitter in right ear removable at hs  . Wears glasses   . Wears partial dentures     Past Surgical History:  Procedure Laterality Date  . APPENDECTOMY    . COLECTOMY W/ COLOSTOMY  1996   W/   APPENDECTOMY  . COLONOSCOPY N/A 05/11/2018   Procedure: COLONOSCOPY;  Surgeon: Daneil Dolin, MD;  [facility];  Service: Endoscopy;  Laterality: N/A;  9:30  . COLOSTOMY TAKEDOWN  1996  .  CYSTOSCOPY WITH BIOPSY N/A 12/05/2013   Procedure: CYSTO BLADDER BIOPSY AND FULGERATION;  Surgeon: Festus Aloe, MD;  Location: [facility];  Service: Urology;  Laterality: N/A;  . CYSTOSCOPY WITH BIOPSY Bilateral 11/13/2014   Procedure: CYSTOSCOPY WITH  BLADDER BIOPSY FULGERATION AND BILATERAL RETROGRADE PYELOGRAMS;  Surgeon: Festus Aloe, MD;  Location: [facility];  Service: Urology;  Laterality: Bilateral;  . CYSTOSCOPY WITH FULGERATION N/A 01/18/2018   Procedure: CYSTOSCOPY WITH FULGERATION/ BLADDER BIOPSY;  Surgeon: Festus Aloe, MD;  Location: [facility];  Service: Urology;  Laterality: N/A;  . CYSTOSCOPY WITH INJECTION N/A 11/09/2018   Procedure: CYSTOSCOPY WITH INJECTION OF INDOCYANINE GREEN DYE;  Surgeon: Alexis Frock, MD;  Location: [facility];  Service: Urology;  Laterality: N/A;  . CYSTOSCOPY WITH INSERTION OF UROLIFT N/A 01/18/2018   Procedure: CYSTOSCOPY WITH INSERTION OF UROLIFT;  Surgeon: Festus Aloe, MD;  Location: [facility];  Service: Urology;  Laterality: N/A;  . CYSTOSCOPY/URETEROSCOPY/HOLMIUM LASER Left 02/17/2019   Procedure: URETEROSCOPY WITH BALLOON DILATION  AND NEPHROSTOGRAM;  Surgeon: Alexis Frock, MD;  Location: [facility];  Service: Urology;  Laterality: Left;  1 HR  . EXCISION RIGHT UPPER ARM LIPOMA  2005  . HEMORROIDECTOMY    . INGUINAL HERNIA REPAIR Left 1984  . IR NEPHROSTOMY PLACEMENT LEFT  01/05/2019  .  KNEE ARTHROSCOPY Left X3  LAST ONE  2002  . ORIF LEFT HUMEROUS FX  1976  . POLYPECTOMY  05/11/2018   Procedure: POLYPECTOMY;  Surgeon: Daneil Dolin, MD;  Location: [facility];  Service: Endoscopy;;  . PROSTATE SURGERY    . TONSILLECTOMY  AS CHILD  . TOTAL KNEE ARTHROPLASTY Left 2006   REVISION 2007  (AFTER I & D WITH ANTIBIOTIC SPACER PROCEDURE FOR STEPH INFECTION)  . TOTAL KNEE ARTHROPLASTY Right 03/30/2016   Procedure: RIGHT TOTAL KNEE ARTHROPLASTY;  Surgeon:  Paralee Cancel, MD;  Location: [facility];  Service: Orthopedics;  Laterality: Right;  . TOTAL SHOULDER ARTHROPLASTY Right 04/06/2013   Procedure: RIGHT TOTAL SHOULDER ARTHROPLASTY;  Surgeon: Marin Shutter, MD;  Location: [place];  Service: Orthopedics;  Laterality: Right;  . TOTAL SHOULDER ARTHROPLASTY Left 07/20/2013   Procedure: LEFT TOTAL SHOULDER ARTHROPLASTY;  Surgeon: Marin Shutter, MD;  Location: [place];  Service: Orthopedics;  Laterality: Left;  . TRANSURETHRAL RESECTION OF BLADDER TUMOR N/A 11/12/2015   Procedure: TRANSURETHRAL RESECTION OF BLADDER TUMOR (TURBT);  Surgeon: Festus Aloe, MD;  Location: [facility];  Service: Urology;  Laterality: N/A;  . TRANSURETHRAL RESECTION OF BLADDER TUMOR WITH GYRUS (TURBT-GYRUS) N/A 12/05/2013   Procedure: TRANSURETHRAL RESECTION OF BLADDER TUMOR WITH GYRUS (TURBT-GYRUS);  Surgeon: Festus Aloe, MD;  Location: [facility];  Service: Urology;  Laterality: N/A;     reports that he quit smoking about 22 years ago. His smoking use included cigarettes. He has a 80.00 pack-year smoking history. He has never used smokeless tobacco. He reports that he does not drink alcohol or use drugs.  No Known Allergies  Family History  Problem Relation Age of Onset  . Alzheimer's disease Mother   . Heart attack Father   . Heart disease Father   . Irregular heart beat Brother        DEFIB.  . Congestive Heart Failure Brother   . Diabetes Daughter     Prior to Admission medications   Medication Sig Start Date End Date Taking? Authorizing Provider  amLODipine (NORVASC) 10 MG tablet Take 1 tablet (10 mg total) by mouth daily. 05/24/19 05/23/20  Terald Sleeper, PA-C  atorvastatin (LIPITOR) 20 MG tablet Take 1 tablet (20 mg total) by mouth daily. 05/30/19 08/28/19  Troy Sine, MD  diclofenac (VOLTAREN) 75 MG EC tablet TAKE 1 TABLET BY MOUTH TWICE A DAY 05/19/19   Terald Sleeper, PA-C  docusate sodium (COLACE) 100 MG capsule Take 200  mg by mouth daily.    [provider]  ferrous sulfate 325 (65 FE) MG tablet Take 1 tablet (325 mg total) by mouth daily with breakfast. 05/16/19   Sheth, Devam P, MD  lisinopril (ZESTRIL) 5 MG tablet Take 1 tablet (5 mg total) by mouth daily. 06/12/19   Terald Sleeper, PA-C  Multiple Vitamin (MULTIVITAMIN WITH MINERALS) TABS tablet Take 1 tablet by mouth daily.    [provider]  polyvinyl alcohol (LIQUIFILM TEARS) 1.4 % ophthalmic solution Place 1 drop into both eyes as needed for dry eyes. 05/16/19   Sheth, Vickii Chafe, MD  rosuvastatin (CRESTOR) 10 MG tablet Take 1 tablet (10 mg total) by mouth daily. 06/19/19 06/13/20  Troy Sine, MD  senna-docusate (SENOKOT-S) 8.6-50 MG tablet Take 1 tablet by mouth daily as needed for mild constipation. 05/16/19   Vicenta Dunning, MD    Physical Exam: Vitals:   06/25/19 2100 06/25/19 2115 06/25/19 2130 06/25/19 2145  BP: 118/70 125/67 121/69 122/69  Pulse: 70 66 68 66  Resp: (!) 25 19 (!) 21 20  Temp:      TempSrc:      SpO2: 96% 96% 95% 97%  Weight:      Height:          Constitutional: NAD, calm, comfortable Vitals:   06/25/19 2100 06/25/19 2115 06/25/19 2130 06/25/19 2145  BP: 118/70 125/67 121/69 122/69  Pulse: 70 66 68 66  Resp: (!) 25 19 (!) 21 20  Temp:      TempSrc:      SpO2: 96% 96% 95% 97%  Weight:      Height:       Eyes: PERRL, lids and conjunctivae normal ENMT: Mucous membranes are moist. Posterior pharynx clear of any exudate or lesions.Normal dentition.  Neck: normal, supple, no masses, no thyromegaly Respiratory: clear to auscultation bilaterally, no wheezing, no crackles. Normal respiratory effort. No accessory muscle use.  Cardiovascular: Regular rate and rhythm, no murmurs / rubs / gallops. No extremity edema. 2+ pedal pulses. No carotid bruits.  Abdomen: no tenderness, no masses palpated. No hepatosplenomegaly. Bowel sounds positive.  Urostomy with clean clear nonpurulent urine Musculoskeletal: no  clubbing / cyanosis. No joint deformity upper and lower extremities. Good ROM, no contractures. Normal muscle tone.  Skin: no rashes, lesions, ulcers. No induration Neurologic: CN 2-12 grossly intact. Sensation intact, DTR normal. Strength 5/5 in all 4.  Psychiatric: Normal judgment and insight. Alert and oriented x 3. Normal mood.    Labs on Admission: I have personally reviewed following labs and imaging studies  CBC: Recent Labs  Lab 06/25/19 1838  WBC 11.1*  NEUTROABS 8.8*  HGB 11.0*  HCT 34.6*  MCV 87.6  PLT [id number]   Basic Metabolic Panel: Recent Labs  Lab 06/25/19 1838  NA 132*  K 4.2  CL 98  CO2 24  GLUCOSE 113*  BUN 27*  CREATININE 1.50*  CALCIUM 9.8   GFR: Estimated Creatinine Clearance: 48.8 mL/min (A) (by C-G formula based on SCr of 1.5 mg/dL (H)). Liver Function Tests: Recent Labs  Lab 06/25/19 1838  AST 26  ALT 18  ALKPHOS 64  BILITOT 1.2  PROT 8.2*  ALBUMIN 4.1   No results for input(s): LIPASE, AMYLASE in the last 168 hours. No results for input(s): AMMONIA in the last 168 hours. Coagulation Profile: Recent Labs  Lab 06/25/19 1838  INR 1.3*   Cardiac Enzymes: No results for input(s): CKTOTAL, CKMB, CKMBINDEX, TROPONINI in the last 168 hours. BNP (last 3 results) No results for input(s): PROBNP in the last 8760 hours. HbA1C: No results for input(s): HGBA1C in the last 72 hours. CBG: No results for input(s): GLUCAP in the last 168 hours. Lipid Profile: No results for input(s): CHOL, HDL, LDLCALC, TRIG, CHOLHDL, LDLDIRECT in the last 72 hours. Thyroid Function Tests: No results for input(s): TSH, T4TOTAL, FREET4, T3FREE, THYROIDAB in the last 72 hours. Anemia Panel: No results for input(s): VITAMINB12, FOLATE, FERRITIN, TIBC, IRON, RETICCTPCT in the last 72 hours. Urine analysis:    Component Value Date/Time   COLORURINE YELLOW 06/25/2019 1838   APPEARANCEUR HAZY (A) 06/25/2019 1838   APPEARANCEUR Cloudy (A) 01/27/2018 1625   LABSPEC  1.012 06/25/2019 1838   PHURINE 7.0 06/25/2019 1838   GLUCOSEU NEGATIVE 06/25/2019 1838   HGBUR SMALL (A) 06/25/2019 1838   BILIRUBINUR NEGATIVE 06/25/2019 1838   BILIRUBINUR Negative 01/27/2018 [place] 06/25/2019 1838   PROTEINUR 30 (A) 06/25/2019 1838  NITRITE POSITIVE (A) 06/25/2019 1838   LEUKOCYTESUR LARGE (A) 06/25/2019 1838   Sepsis Labs: !!!!!!!!!!!!!!!!!!!!!!!!!!!!!!!!!!!!!!!!!!!! @LABRCNTIP (procalcitonin:4,lacticidven:4) )No results found for this or any previous visit (from the past 240 hour(s)).   Radiological Exams on Admission: DG Chest Port 1 View  Result Date: 06/25/2019 CLINICAL DATA:  Sepsis EXAM: PORTABLE CHEST 1 VIEW COMPARISON:  April 09, 2019 FINDINGS: The patient is status post prior total shoulder arthroplasty. The heart size is stable from prior study. There is no pneumothorax. No large pleural effusion. There is no focal infiltrate. IMPRESSION: No active disease. Electronically Signed   By: Constance Holster M.D.   On: 06/25/2019 19:26   Old chart reviewed Case discussed with EDP Chest x-ray with no edema or infiltrate  Assessment/Plan 74 year old male history of bladder cancer status post urostomy comes in with recurrent UTI Principal Problem:   UTI (urinary tract infection)-urine culture and blood cultures pending.  Blood pressure stable patient not septic.  Placed on vancomycin and Merrem.  Tylenol for fever.  Active Problems:   AKI (acute kidney injury) (HCC)-mild bump creatinine up to 1.  5.  Has adequate urinary output.  Gentle IV fluids overnight.  Repeat creatinine in the morning.    HTN (hypertension)-resume home meds    Bladder cancer (HCC)-noted    OSA (obstructive sleep apnea)-stable    DVT prophylaxis: SCDs Code Status: Full Family Communication: None Disposition Plan: 1 to 3 days Consults called none Admission status: Admission   Oletha Tolson A MD [facility]  If 7PM-7AM, please contact  night-coverage [email or web address] Password [facility]  06/25/2019, 9:54 PM

## 2019-06-25 NOTE — ED Provider Notes (Signed)
[facility] DEPT Provider Note   CSN: [id number] Arrival date & time: 06/25/19  1741     History Chief Complaint  Patient presents with  . Fever    Billy Rasmussen is a 74 y.o. male.  The history is provided by the patient.  Fever Max temp prior to arrival:  104 Temp source:  Oral Severity:  Mild Onset quality:  Sudden Timing:  Constant Progression:  Unchanged Chronicity:  New Relieved by:  Acetaminophen Worsened by:  Nothing Associated symptoms: myalgias   Associated symptoms: no chest pain, no chills, no cough, no dysuria, no ear pain, no rash, no sore throat and no vomiting   Associated symptoms comment:  Hx of bladder cancer s/p urostomy with recurrent UTI. Risk factors: hx of cancer   Risk factors: no immunosuppression and no sick contacts        Past Medical History:  Diagnosis Date  . Arthritis    knees, shoulders, elbows  . Bladder cancer [facility])    urologist-  dr Junious Silk  . Diverticulosis of colon   . Fibromyalgia   . GERD (gastroesophageal reflux disease)   . History of diverticulitis of colon   . History of gastric ulcer    due to aleve  . Hypertension   . Lower urinary tract symptoms (LUTS)   . OSA (obstructive sleep apnea)    NON-COMPLIANT  CPAP  --- BUT PT USES OXYGEN AT NIGHT 2.5L VIA [place] (PT'S DECISION)  . PONV (postoperative nausea and vomiting)   . Psoriasis   . Tinnitus    right ear more, has tranmitter in right ear removable at hs  . Wears glasses   . Wears partial dentures     Patient Active Problem List   Diagnosis Date Noted  . OSA (obstructive sleep apnea) 05/12/2019  . Fever 04/09/2019  . SIRS (systemic inflammatory response syndrome) ([place]) 04/09/2019  . Sepsis due to urinary tract infection ([place]) 02/17/2019  . Abnormal renal function 01/20/2019  . Enterococcus UTI   . Urinary tract infection without hematuria   . Acute pyelonephritis   . Gastroesophageal reflux disease   . Sepsis ([place])  12/22/2018  . AKI (acute kidney injury) ([place]) 12/22/2018  . Bladder cancer ([place]) 11/09/2018  . 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 Acute cystitis with hematuria 10/02/2016  . Pure hypercholesterolemia 10/02/2016  . Malignant neoplasm of urinary bladder ([place]) 10/02/2016  . Lipoma 09/30/2016  . Obese 03/31/2016  . S/P right TKA 03/30/2016  . S/P knee replacement 03/30/2016  . HTN (hypertension) 02/25/2016  . Healthcare maintenance 02/25/2016  . Obesity (BMI 30-39.9) 01/07/2016  . S/P shoulder replacement 07/20/2013  . Shoulder arthritis 04/07/2013  . CELLULITIS, KNEE, LEFT 08/26/2006    Past Surgical History:  Procedure Laterality Date  . APPENDECTOMY    . COLECTOMY W/ COLOSTOMY  1996   W/   APPENDECTOMY  . COLONOSCOPY N/A 05/11/2018   Procedure: COLONOSCOPY;  Surgeon: Daneil Dolin, MD;  [facility];  Service: Endoscopy;  Laterality: N/A;  9:30  . COLOSTOMY TAKEDOWN  1996  . CYSTOSCOPY WITH BIOPSY N/A 12/05/2013   Procedure: CYSTO BLADDER BIOPSY AND FULGERATION;  Surgeon: Festus Aloe, MD;  Location[facility];  Service: Urology;  Laterality: N/A;  . CYSTOSCOPY WITH BIOPSY Bilateral 11/13/2014   Procedure: CYSTOSCOPY WITH  BLADDER BIOPSY FULGERATION AND BILATERAL RETROGRADE PYELOGRAMS;  Surgeon: Festus Aloe, MD;  Location: [facility];  Service: Urology;  Laterality: Bilateral;  . [place]  N/A 01/18/2018   Procedure: CYSTOSCOPY WITH FULGERATION/ BLADDER BIOPSY;  Surgeon: Festus Aloe, MD;  Location: [facility];  Service: Urology;  Laterality: N/A;  . CYSTOSCOPY WITH INJECTION N/A 11/09/2018   Procedure: CYSTOSCOPY WITH INJECTION OF INDOCYANINE GREEN DYE;  Surgeon: Alexis Frock, MD;  Location: [facility];  Service: Urology;  Laterality: N/A;  . CYSTOSCOPY WITH INSERTION OF UROLIFT N/A 01/18/2018   Procedure: CYSTOSCOPY WITH INSERTION OF UROLIFT;  Surgeon: Festus Aloe, MD;  Location[facility];  Service: Urology;  Laterality: N/A;  . CYSTOSCOPY/URETEROSCOPY/HOLMIUM LASER Left 02/17/2019   Procedure: URETEROSCOPY WITH BALLOON DILATION  AND NEPHROSTOGRAM;  Surgeon: Alexis Frock, MD;  Location: [facility];  Service: Urology;  Laterality: Left;  1 HR  . EXCISION RIGHT UPPER ARM LIPOMA  2005  . HEMORROIDECTOMY    . INGUINAL HERNIA REPAIR Left 1984  . IR NEPHROSTOMY PLACEMENT LEFT  01/05/2019  . KNEE ARTHROSCOPY Left X3  LAST ONE  2002  . ORIF LEFT HUMEROUS FX  1976  . POLYPECTOMY  05/11/2018   Procedure: POLYPECTOMY;  Surgeon: Daneil Dolin, MD;  [facility];  Service: Endoscopy;;  . PROSTATE SURGERY    . TONSILLECTOMY  AS CHILD  . TOTAL KNEE ARTHROPLASTY Left 2006   REVISION 2007  (AFTER I & D WITH ANTIBIOTIC SPACER PROCEDURE FOR STEPH INFECTION)  . TOTAL KNEE ARTHROPLASTY Right 03/30/2016   Procedure: RIGHT TOTAL KNEE ARTHROPLASTY;  Surgeon: Paralee Cancel, MD;  Location[facility];  Service: Orthopedics;  Laterality: Right;  . TOTAL SHOULDER ARTHROPLASTY Right 04/06/2013   Procedure: RIGHT TOTAL SHOULDER ARTHROPLASTY;  Surgeon: Marin Shutter, MD;  Location: [facility];  Service: Orthopedics;  Laterality: Right;  . TOTAL SHOULDER ARTHROPLASTY Left 07/20/2013   Procedure: LEFT TOTAL SHOULDER ARTHROPLASTY;  Surgeon: Marin Shutter, MD;  Location: [place];  Service: Orthopedics;  Laterality: Left;  . TRANSURETHRAL RESECTION OF BLADDER TUMOR N/A 11/12/2015   Procedure: TRANSURETHRAL RESECTION OF BLADDER TUMOR (TURBT);  Surgeon: Festus Aloe, MD;  Location: [facility];  Service: Urology;  Laterality: N/A;  . TRANSURETHRAL RESECTION OF BLADDER TUMOR WITH GYRUS (TURBT-GYRUS) N/A 12/05/2013   Procedure: TRANSURETHRAL RESECTION OF BLADDER TUMOR WITH GYRUS (TURBT-GYRUS);  Surgeon: Festus Aloe, MD;  Location: [facility];  Service: Urology;  Laterality: N/A;       Family History  Problem Relation Age of Onset  .  Alzheimer's disease Mother   . Heart attack Father   . Heart disease Father   . Irregular heart beat Brother        DEFIB.  . Congestive Heart Failure Brother   . Diabetes Daughter     Social History   Tobacco Use  . Smoking status: Former Smoker    Packs/day: 2.00    Years: 40.00    Pack years: 80.00    Types: Cigarettes    Quit date: 06/01/1997    Years since quitting: 22.0  . Smokeless tobacco: Never Used  Substance Use Topics  . Alcohol use: No  . Drug use: No    Home Medications Prior to Admission medications   Medication Sig Start Date End Date Taking? Authorizing Provider  amLODipine (NORVASC) 10 MG tablet Take 1 tablet (10 mg total) by mouth daily. 05/24/19 05/23/20  Terald Sleeper, PA-C  atorvastatin (LIPITOR) 20 MG tablet Take 1 tablet (20 mg total) by mouth daily. 05/30/19 08/28/19  Troy Sine, MD  diclofenac (VOLTAREN) 75 MG EC tablet TAKE  1 TABLET BY MOUTH TWICE A DAY 05/19/19   Terald Sleeper, PA-C  docusate sodium (COLACE) 100 MG capsule Take 200 mg by mouth daily.    [provider]  ferrous sulfate 325 (65 FE) MG tablet Take 1 tablet (325 mg total) by mouth daily with breakfast. 05/16/19   Sheth, Devam P, MD  lisinopril (ZESTRIL) 5 MG tablet Take 1 tablet (5 mg total) by mouth daily. 06/12/19   Terald Sleeper, PA-C  Multiple Vitamin (MULTIVITAMIN WITH MINERALS) TABS tablet Take 1 tablet by mouth daily.    [provider]  polyvinyl alcohol (LIQUIFILM TEARS) 1.4 % ophthalmic solution Place 1 drop into both eyes as needed for dry eyes. 05/16/19   Sheth, Vickii Chafe, MD  rosuvastatin (CRESTOR) 10 MG tablet Take 1 tablet (10 mg total) by mouth daily. 06/19/19 06/13/20  Troy Sine, MD  senna-docusate (SENOKOT-S) 8.6-50 MG tablet Take 1 tablet by mouth daily as needed for mild constipation. 05/16/19   Vicenta Dunning, MD    Allergies    Patient has no known allergies.  Review of Systems   Review of Systems  Constitutional: Positive for fever.  Negative for chills.  HENT: Negative for ear pain and sore throat.   Eyes: Negative for pain and visual disturbance.  Respiratory: Negative for cough and shortness of breath.   Cardiovascular: Negative for chest pain and palpitations.  Gastrointestinal: Negative for abdominal pain and vomiting.  Genitourinary: Negative for dysuria and hematuria.  Musculoskeletal: Positive for back pain and myalgias. Negative for arthralgias.  Skin: Negative for color change and rash.  Neurological: Negative for seizures and syncope.  All other systems reviewed and are negative.   Physical Exam Updated Vital Signs  ED Triage Vitals  Enc Vitals Group     BP 06/25/19 1818 126/76     Pulse Rate 06/25/19 1818 99     Resp 06/25/19 1818 19     Temp 06/25/19 1818 100.1 F (37.8 C)     Temp Source 06/25/19 1818 Oral     SpO2 06/25/19 1818 99 %     Weight --      Height --      Head Circumference --      Peak Flow --      Pain Score 06/25/19 1819 7     Pain Loc --      Pain Edu? --      Excl. in [place]? --     Physical Exam Vitals and nursing note reviewed.  Constitutional:      General: He is not in acute distress.    Appearance: He is well-developed. He is not ill-appearing.  HENT:     Head: Normocephalic and atraumatic.     Nose: Nose normal.     Mouth/Throat:     Mouth: Mucous membranes are moist.  Eyes:     Conjunctiva/sclera: Conjunctivae normal.     Pupils: Pupils are equal, round, and reactive to light.  Cardiovascular:     Rate and Rhythm: Normal rate and regular rhythm.     Pulses: Normal pulses.     Heart sounds: Normal heart sounds. No murmur.  Pulmonary:     Effort: Pulmonary effort is normal. No respiratory distress.     Breath sounds: Normal breath sounds.  Abdominal:     General: There is no distension.     Palpations: Abdomen is soft.     Tenderness: There is no abdominal tenderness. There is no right CVA tenderness  or left CVA tenderness.     Comments: Urostomy with  cloudy urine  Musculoskeletal:        General: Normal range of motion.     Cervical back: Normal range of motion and neck supple.  Skin:    General: Skin is warm and dry.     Capillary Refill: Capillary refill takes less than 2 seconds.  Neurological:     General: No focal deficit present.     Mental Status: He is alert.  Psychiatric:        Mood and Affect: Mood normal.     ED Results / Procedures / Treatments   Labs (all labs ordered are listed, but only abnormal results are displayed) Labs Reviewed  CBC WITH DIFFERENTIAL/PLATELET - Abnormal; Notable for the following components:      Result Value   WBC 11.1 (*)    RBC 3.95 (*)    Hemoglobin 11.0 (*)    HCT 34.6 (*)    RDW 16.3 (*)    Neutro Abs 8.8 (*)    Monocytes Absolute 1.1 (*)    All other components within normal limits  APTT - Abnormal; Notable for the following components:   aPTT 42 (*)    All other components within normal limits  PROTIME-INR - Abnormal; Notable for the following components:   Prothrombin Time 16.0 (*)    INR 1.3 (*)    All other components within normal limits  URINALYSIS, ROUTINE W REFLEX MICROSCOPIC - Abnormal; Notable for the following components:   APPearance HAZY (*)    Hgb urine dipstick SMALL (*)    Protein, ur 30 (*)    Nitrite POSITIVE (*)    Leukocytes,Ua LARGE (*)    WBC, UA >50 (*)    Bacteria, UA MANY (*)    All other components within normal limits  CULTURE, BLOOD (ROUTINE X 2)  CULTURE, BLOOD (ROUTINE X 2)  URINE CULTURE  RESPIRATORY PANEL BY RT PCR (FLU A&B, COVID)  LACTIC ACID, PLASMA  LACTIC ACID, PLASMA  COMPREHENSIVE METABOLIC PANEL    EKG EKG Interpretation  Date/Time:  Sunday June 25 2019 19:50:38 EST Ventricular Rate:  76 PR Interval:    QRS Duration: 110 QT Interval:  395 QTC Calculation: 445 R Axis:   -6 Text Interpretation: Sinus rhythm Prolonged PR interval Low voltage, precordial leads Confirmed by Lennice Sites [phone]) on 06/25/2019 8:05:31  PM   Radiology DG Chest Port 1 View  Result Date: 06/25/2019 CLINICAL DATA:  Sepsis EXAM: PORTABLE CHEST 1 VIEW COMPARISON:  April 09, 2019 FINDINGS: The patient is status post prior total shoulder arthroplasty. The heart size is stable from prior study. There is no pneumothorax. No large pleural effusion. There is no focal infiltrate. IMPRESSION: No active disease. Electronically Signed   By: Constance Holster M.D.   On: 06/25/2019 19:26    Procedures .Critical Care Performed by: Lennice Sites, DO Authorized by: Lennice Sites, DO   Critical care provider statement:    Critical care time (minutes):  35   Critical care was necessary to treat or prevent imminent or life-threatening deterioration of the following conditions:  Sepsis   Critical care was time spent personally by me on the following activities:  Blood draw for specimens, development of treatment plan with patient or surrogate, discussions with primary provider, evaluation of patient's response to treatment, examination of patient, obtaining history from patient or surrogate, ordering and review of laboratory studies, ordering and performing treatments and interventions, ordering and review of  radiographic studies, pulse oximetry, re-evaluation of patient's condition and review of old charts   I assumed direction of critical care for this patient from another provider in my specialty: no     (including critical care time)  Medications Ordered in ED Medications  meropenem (MERREM) 1 g in sodium chloride 0.9 % 100 mL IVPB (has no administration in time range)  vancomycin (VANCOREADY) IVPB 2000 mg/400 mL (2,000 mg Intravenous New Bag/Given 06/25/19 1945)  sodium chloride 0.9 % bolus 1,000 mL (1,000 mLs Intravenous New Bag/Given 06/25/19 1911)  acetaminophen (TYLENOL) tablet 650 mg (650 mg Oral Given 06/25/19 1946)    ED Course  I have reviewed the triage vital signs and the nursing notes.  Pertinent labs & imaging results  that were available during my care of the patient were reviewed by me and considered in my medical decision making (see chart for details).    MDM Rules/Calculators/A&P                      Billy Rasmussen is a 74 year old male with history of hypertension, bladder cancer status post urostomy who presents to the ED with fever.  Patient with low-grade temperature but otherwise normal vitals upon arrival.  Had temperature of 104 prior to arrival and took Tylenol.  Recently admitted for sepsis from UTI.  Has low back pain but no other symptoms.  No abdominal pain.  Urostomy has cloudy urine.  Sepsis work-up initiated given fever and mild tachycardia.  Talked with pharmacy and they recommend vancomycin and meropenem given multiple drug-resistant UTIs.  Will give fluid bolus.  Anticipate admission for further sepsis care.  Patient with leukocytosis, urine consistent with infection.  Chest x-ray with no signs of infection.  Will admit for further sepsis care secondary to UTI.  Hemodynamically stable after IV fluids and Tylenol.  This chart was dictated using voice recognition software.  Despite best efforts to proofread,  errors can occur which can change the documentation meaning.    Final Clinical Impression(s) / ED Diagnoses Final diagnoses:  Sepsis, due to unspecified organism, unspecified whether acute organ dysfunction present [facility])  Urinary tract infection without hematuria, site unspecified    Rx / DC Orders ED Discharge Orders    None       Lennice Sites, DO 06/25/19 2019

## 2019-06-26 DIAGNOSIS — N10 Acute tubulo-interstitial nephritis: Secondary | ICD-10-CM

## 2019-06-26 DIAGNOSIS — A419 Sepsis, unspecified organism: Secondary | ICD-10-CM

## 2019-06-26 DIAGNOSIS — N179 Acute kidney failure, unspecified: Secondary | ICD-10-CM

## 2019-06-26 LAB — CBC
HCT: 31.4 % — ABNORMAL LOW (ref 39.0–52.0)
HCT: 36.9 % — ABNORMAL LOW (ref 39.0–52.0)
Hemoglobin: 10.1 g/dL — ABNORMAL LOW (ref 13.0–17.0)
Hemoglobin: 11.8 g/dL — ABNORMAL LOW (ref 13.0–17.0)
MCH: 28.7 pg (ref 26.0–34.0)
MCH: 28.2 pg (ref 26.0–34.0)
MCHC: 32.2 g/dL (ref 30.0–36.0)
MCHC: 32 g/dL (ref 30.0–36.0)
MCV: 89.2 fL (ref 80.0–100.0)
MCV: 88.1 fL (ref 80.0–100.0)
Platelets: 136 10*3/uL — ABNORMAL LOW (ref 150–400)
Platelets: 128 10*3/uL — ABNORMAL LOW (ref 150–400)
RBC: 3.52 MIL/uL — ABNORMAL LOW (ref 4.22–5.81)
RBC: 4.19 MIL/uL — ABNORMAL LOW (ref 4.22–5.81)
RDW: 16.7 % — ABNORMAL HIGH (ref 11.5–15.5)
RDW: 16.5 % — ABNORMAL HIGH (ref 11.5–15.5)
WBC: 9.1 10*3/uL (ref 4.0–10.5)
WBC: 8.3 10*3/uL (ref 4.0–10.5)
nRBC: 0 % (ref 0.0–0.2)
nRBC: 0 % (ref 0.0–0.2)

## 2019-06-26 LAB — RESPIRATORY PANEL BY RT PCR (FLU A&B, COVID)
Influenza A by PCR: NEGATIVE
Influenza B by PCR: NEGATIVE
SARS Coronavirus 2 by RT PCR: NEGATIVE

## 2019-06-26 LAB — BASIC METABOLIC PANEL
Anion gap: 9 (ref 5–15)
BUN: 25 mg/dL — ABNORMAL HIGH (ref 8–23)
CO2: 25 mmol/L (ref 22–32)
Calcium: 9.8 mg/dL (ref 8.9–10.3)
Chloride: 103 mmol/L (ref 98–111)
Creatinine, Ser: 1.4 mg/dL — ABNORMAL HIGH (ref 0.61–1.24)
GFR calc Af Amer: 58 mL/min — ABNORMAL LOW (ref >60)
GFR calc non Af Amer: 50 mL/min — ABNORMAL LOW (ref >60)
Glucose, Bld: 86 mg/dL (ref 70–99)
Potassium: 3.9 mmol/L (ref 3.5–5.1)
Sodium: 137 mmol/L (ref 135–145)

## 2019-06-26 LAB — CREATININE, SERUM
Creatinine, Ser: 1.25 mg/dL — ABNORMAL HIGH (ref 0.61–1.24)
GFR calc Af Amer: >60 mL/min (ref >60)
GFR calc non Af Amer: 58 mL/min — ABNORMAL LOW (ref >60)

## 2019-06-26 MED ORDER — SODIUM CHLORIDE 0.9 % IV SOLN
1.0000 g | Freq: Three times a day (TID) | INTRAVENOUS | Status: DC
  Administered 2019-06-26 – 2019-06-28 (×6): 1 g via INTRAVENOUS
  Filled 2019-06-26 (×7): qty 1

## 2019-06-26 MED ORDER — HYDROCODONE-ACETAMINOPHEN 7.5-325 MG PO TABS
1.0000 | ORAL_TABLET | Freq: Four times a day (QID) | ORAL | Status: DC | PRN
  Administered 2019-06-26 – 2019-06-27 (×3): 1 via ORAL
  Filled 2019-06-26 (×3): qty 1

## 2019-06-26 NOTE — Plan of Care (Signed)
K

## 2019-06-26 NOTE — Progress Notes (Signed)
Pharmacy Antibiotic Note  Billy Rasmussen is a 74 y.o. male admitted on 06/25/2019 with bacteremia.  Pharmacy has been consulted for meropenem and vancomycin dosing.  06/26/2019 Scr 1.4, CrCl ~ 53.65mls/min   Plan: Increase Meropenem 1 gm IV to q8h Continue vanc 1 Gm IV q24h for est AUC = 500.2 Use scr = 1.4, Vd = 0.5 Goal AUC = 400-550 F/u scr/cultues/levels  Height: 5\' 7"  (170.2 cm) Weight: 210 lb 1.6 oz (95.3 kg) IBW/kg (Calculated) : 66.1  Temp (24hrs), Avg:98.6 F (37 C), Min:97.9 F (36.6 C), Max:100.1 F (37.8 C)  Recent Labs  Lab 06/25/19 1838 06/25/19 2131 06/26/19 0511  WBC 11.1* 9.1 8.3  CREATININE 1.50* 1.25* 1.40*  LATICACIDVEN 0.8  --   --     Estimated Creatinine Clearance: 53.3 mL/min (A) (by C-G formula based on SCr of 1.4 mg/dL (H)).    No Known Allergies  Antimicrobials this admission: 1/24 meropenem >>  1/24 vancomycin >>   Dose adjustments this admission:   Microbiology results: 1/24  BCx:  1/24  UCx:    Sputum:    MRSA PCR:   Thank you for allowing pharmacy to be a part of this patient's care.  Dolly Rias RPh 06/26/2019, 9:43 AM

## 2019-06-26 NOTE — Progress Notes (Signed)
PROGRESS NOTE    Billy Rasmussen   [id number]  DOB: Oct 29, 1945  DOA: 06/25/2019 PCP: Terald Sleeper, PA-C   Brief Narrative:  Billy Rasmussen  is a 74 y.o. male with medical history significant of bladder cancer status post resection and urostomy, fibromyalgia, hypertension, frequent UTIs comes in with sudden fevers. Noted to have  Temp of 104. In ED found to have a UTI and started on Vanc and Meropenem due to h/o prior resistant UTIs.  He was admitted in Dec with a UTI and culture revealed pseudomonas and ESBL Klebsiella. He was also admitted in Nov with a UTI as well.   Subjective: He has some lower back pain. No other complaints.     Assessment & Plan:   Principal Problem:  Complicated UTI (urinary tract infection) in setting of ileal conduit/urostomy with h/o bladder cancer - f/u on cultures and continue Vanc and Meropenem in the meantime - I have notified his Urologist and his ID physician of his admission.  Active Problems:   HTN (hypertension) - cont Norvasc     Hyponatremia and AKI (acute kidney injury) ([place]) - Cr 1.50 in ED- has improved slightly to 1.40- cont to follow - Lisinopril on hold  Lower back pain - He states it related to the bed but could also be a sign of Pyelonephririts  Acute thrombocytopenia - likely due to underlying infection- cont to follow    OSA (obstructive sleep apnea) - uses O2 at night  Time spent in minutes: 35 DVT prophylaxis: Lovenox Code Status: Full code Family Communication: wife at bedside Disposition Plan: home Consultants:   none Procedures:   none Antimicrobials:  Anti-infectives (From admission, onward)   Start     Dose/Rate Route Frequency Ordered Stop   06/26/19 2000  vancomycin (VANCOCIN) IVPB 1000 mg/200 mL premix     1,000 mg 200 mL/hr over 60 Minutes Intravenous Every 24 hours 06/25/19 2151     06/26/19 1600  meropenem (MERREM) 1 g in sodium chloride 0.9 % 100 mL IVPB     1 g 200 mL/hr over 30 Minutes  Intravenous Every 8 hours 06/26/19 0938     06/26/19 0800  meropenem (MERREM) 1 g in sodium chloride 0.9 % 100 mL IVPB  Status:  Discontinued     1 g 200 mL/hr over 30 Minutes Intravenous Every 12 hours 06/25/19 2137 06/26/19 0938   06/25/19 2200  meropenem (MERREM) 1 g in sodium chloride 0.9 % 100 mL IVPB  Status:  Discontinued     1 g 200 mL/hr over 30 Minutes Intravenous Every 12 hours 06/25/19 2137 06/25/19 2137   06/25/19 1915  meropenem (MERREM) 1 g in sodium chloride 0.9 % 100 mL IVPB     1 g 200 mL/hr over 30 Minutes Intravenous STAT 06/25/19 1908 06/25/19 2153   06/25/19 1915  vancomycin (VANCOREADY) IVPB 2000 mg/400 mL     2,000 mg 200 mL/hr over 120 Minutes Intravenous STAT 06/25/19 1910 06/25/19 2228   06/25/19 1845  piperacillin-tazobactam (ZOSYN) IVPB 3.375 g  Status:  Discontinued     3.375 g 100 mL/hr over 30 Minutes Intravenous  Once 06/25/19 1838 06/25/19 1908       Objective: Vitals:   06/26/19 0300 06/26/19 0311 06/26/19 0431 06/26/19 0456  BP: (!) 114/53  (!) 149/82 (!) 152/84  Pulse: 68  66 80  Resp: 18  20 18   Temp:  98 F (36.7 C) 98.3 F (36.8 C) 97.9 F (36.6 C)  TempSrc:  Oral Oral Oral  SpO2: 95%  96% 100%  Weight:    95.3 kg  Height:    5\' 7"  (1.702 m)    Intake/Output Summary (Last 24 hours) at 06/26/2019 1218 Last data filed at 06/26/2019 0440 Gross per 24 hour  Intake 1510.62 ml  Output 1800 ml  Net -289.38 ml   Filed Weights   06/25/19 1927 06/26/19 0456  Weight: 95.3 kg 95.3 kg    Examination: General exam: Appears comfortable  HEENT: PERRLA, oral mucosa moist, no sclera icterus or thrush Respiratory system: Clear to auscultation. Respiratory effort normal. Cardiovascular system: S1 & S2 heard, RRR.   Gastrointestinal system: Abdomen soft, non-tender, nondistended. Normal bowel sounds. Central nervous system: Alert and oriented. No focal neurological deficits. Extremities: No cyanosis, clubbing or edema Skin: No rashes or  ulcers Psychiatry:  Mood & affect appropriate.     Data Reviewed: I have personally reviewed following labs and imaging studies  CBC: Recent Labs  Lab 06/25/19 1838 06/25/19 2131 06/26/19 0511  WBC 11.1* 9.1 8.3  NEUTROABS 8.8*  --   --   HGB 11.0* 10.1* 11.8*  HCT 34.6* 31.4* 36.9*  MCV 87.6 89.2 88.1  PLT 163 136* 0000000*   Basic Metabolic Panel: Recent Labs  Lab 06/25/19 1838 06/25/19 2131 06/26/19 0511  NA 132*  --  137  K 4.2  --  3.9  CL 98  --  103  CO2 24  --  25  GLUCOSE 113*  --  86  BUN 27*  --  25*  CREATININE 1.50* 1.25* 1.40*  CALCIUM 9.8  --  9.8   GFR: Estimated Creatinine Clearance: 53.3 mL/min (A) (by C-G formula based on SCr of 1.4 mg/dL (H)). Liver Function Tests: Recent Labs  Lab 06/25/19 1838  AST 26  ALT 18  ALKPHOS 64  BILITOT 1.2  PROT 8.2*  ALBUMIN 4.1   No results for input(s): LIPASE, AMYLASE in the last 168 hours. No results for input(s): AMMONIA in the last 168 hours. Coagulation Profile: Recent Labs  Lab 06/25/19 1838  INR 1.3*   Cardiac Enzymes: No results for input(s): CKTOTAL, CKMB, CKMBINDEX, TROPONINI in the last 168 hours. BNP (last 3 results) No results for input(s): PROBNP in the last 8760 hours. HbA1C: No results for input(s): HGBA1C in the last 72 hours. CBG: No results for input(s): GLUCAP in the last 168 hours. Lipid Profile: No results for input(s): CHOL, HDL, LDLCALC, TRIG, CHOLHDL, LDLDIRECT in the last 72 hours. Thyroid Function Tests: No results for input(s): TSH, T4TOTAL, FREET4, T3FREE, THYROIDAB in the last 72 hours. Anemia Panel: No results for input(s): VITAMINB12, FOLATE, FERRITIN, TIBC, IRON, RETICCTPCT in the last 72 hours. Urine analysis:    Component Value Date/Time   COLORURINE YELLOW 06/25/2019 1838   APPEARANCEUR HAZY (A) 06/25/2019 1838   APPEARANCEUR Cloudy (A) 01/27/2018 1625   LABSPEC 1.012 06/25/2019 1838   PHURINE 7.0 06/25/2019 1838   GLUCOSEU NEGATIVE 06/25/2019 1838    HGBUR SMALL (A) 06/25/2019 1838   BILIRUBINUR NEGATIVE 06/25/2019 1838   BILIRUBINUR Negative 01/27/2018 1625   KETONESUR NEGATIVE 06/25/2019 1838   PROTEINUR 30 (A) 06/25/2019 1838   NITRITE POSITIVE (A) 06/25/2019 1838   LEUKOCYTESUR LARGE (A) 06/25/2019 1838   Sepsis Labs: @LABRCNTIP (procalcitonin:4,lacticidven:4) ) Recent Results (from the past 240 hour(s))  Blood Culture (routine x 2)     Status: None (Preliminary result)   Collection Time: 06/25/19  6:43 PM   Specimen: BLOOD RIGHT FOREARM  Result Value Ref Range  Status   Specimen Description BLOOD RIGHT FOREARM  Final   Special Requests   Final    BOTTLES DRAWN AEROBIC AND ANAEROBIC Blood Culture adequate volume Performed at [facility] [address] [place], [place] [address]    Culture PENDING  Incomplete   Report Status PENDING  Incomplete  Respiratory Panel by RT PCR (Flu A&B, Covid) -     Status: None   Collection Time: 06/25/19  6:54 PM  Result Value Ref Range Status   SARS Coronavirus 2 by RT PCR NEGATIVE NEGATIVE Final    Comment: (NOTE) SARS-CoV-2 target nucleic acids are NOT DETECTED. The SARS-CoV-2 RNA is generally detectable in upper respiratoy specimens during the acute phase of infection. The lowest concentration of SARS-CoV-2 viral copies this assay can detect is 131 copies/mL. A negative result does not preclude SARS-Cov-2 infection and should not be used as the sole basis for treatment or other patient management decisions. A negative result may occur with  improper specimen collection/handling, submission of specimen other than nasopharyngeal swab, presence of viral mutation(s) within the areas targeted by this assay, and inadequate number of viral copies (<131 copies/mL). A negative result must be combined with clinical observations, patient history, and epidemiological information. The expected result is Negative. Fact Sheet for Patients:   PinkCheek.be Fact Sheet for Healthcare Providers:  GravelBags.it This test is not yet ap proved or cleared by the [place] FDA and  has been authorized for detection and/or diagnosis of SARS-CoV-2 by FDA under an Emergency Use Authorization (EUA). This EUA will remain  in effect (meaning this test can be used) for the duration of the COVID-19 declaration under Section 564(b)(1) of the Act, 21 U.S.C. section 360bbb-3(b)(1), unless the authorization is terminated or revoked sooner.    Influenza A by PCR NEGATIVE NEGATIVE Final   Influenza B by PCR NEGATIVE NEGATIVE Final    Comment: (NOTE) The Xpert Xpress SARS-CoV-2/FLU/RSV assay is intended as an aid in  the diagnosis of influenza from Nasopharyngeal swab specimens and  should not be used as a sole basis for treatment. Nasal washings and  aspirates are unacceptable for Xpert Xpress SARS-CoV-2/FLU/RSV  testing. Fact Sheet for Patients: PinkCheek.be Fact Sheet for Healthcare Providers: GravelBags.it This test is not yet approved or cleared by the [place] FDA and  has been authorized for detection and/or diagnosis of SARS-CoV-2 by  FDA under an Emergency Use Authorization (EUA). This EUA will remain  in effect (meaning this test can be used) for the duration of the  Covid-19 declaration under Section 564(b)(1) of the Act, 21  U.S.C. section 360bbb-3(b)(1), unless the authorization is  terminated or revoked. Performed at [facility] [place] [address] [place], [place] [address]          Radiology Studies: [facility] Chest Port 1 View  Result Date: 06/25/2019 CLINICAL DATA:  Sepsis EXAM: PORTABLE CHEST 1 VIEW COMPARISON:  April 09, 2019 FINDINGS: The patient is status post prior total shoulder arthroplasty. The heart size is stable from prior study. There is no pneumothorax. No large pleural  effusion. There is no focal infiltrate. IMPRESSION: No active disease. Electronically Signed   By: Constance Holster M.D.   On: 06/25/2019 19:26      Scheduled Meds: . amLODipine  10 mg Oral Daily  . enoxaparin (LOVENOX) injection  40 mg Subcutaneous QHS  . multivitamin with minerals  1 tablet Oral Daily  . rosuvastatin  10 mg Oral q1800  . sodium chloride flush  3 mL Intravenous Q12H   Continuous Infusions: . sodium chloride    . meropenem (MERREM) IV    . vancomycin       LOS: 1 day      Debbe Odea, MD [facility] Pager: [email or web address] Password [facility] 06/26/2019, 12:18 PM

## 2019-06-26 NOTE — ED Notes (Signed)
ED TO INPATIENT HANDOFF REPORT  ED Nurse Name and Phone #: Minna Merritts, RN [phone]  S Name/Age/Gender Billy Rasmussen 74 y.o. male Room/Bed: WA20/WA20  Code Status   Code Status: Full Code  Home/SNF/Other Home Patient oriented to: self, place, time and situation Is this baseline? Yes   Triage Complete: Triage complete  Chief Complaint UTI (urinary tract infection) [N39.0]  Triage Note Patient reports having a left kidney infection and fever at home.   104.0 at home  Urostomy bag place in June 2020  Patient reports having appointment tomorrow for blood work and to talk about removing kidney  C/o mid lower back pain     Allergies No Known Allergies  Level of Care/Admitting Diagnosis ED Disposition    ED Disposition Condition [facility] Area: [place] [[id number]]  Level of Care: Med-Surg [16]  Covid Evaluation: Confirmed COVID Negative  Date Laboratory Confirmed COVID Negative: 06/26/2019  Diagnosis: UTI (urinary tract infection) [id number]  Admitting Physician: Phillips Grout [[id number]]  Attending Physician: Derrill Kay A [[id number]]  Estimated length of stay: 3 - 4 days  Certification:: I certify this patient will need inpatient services for at least 2 midnights       B Medical/Surgery History Past Medical History:  Diagnosis Date  . Arthritis    knees, shoulders, elbows  . Bladder cancer [facility])    urologist-  dr Junious Silk  . Diverticulosis of colon   . Fibromyalgia   . GERD (gastroesophageal reflux disease)   . History of diverticulitis of colon   . History of gastric ulcer    due to aleve  . Hypertension   . Lower urinary tract symptoms (LUTS)   . OSA (obstructive sleep apnea)    NON-COMPLIANT  CPAP  --- BUT PT USES OXYGEN AT NIGHT 2.5L VIA [place] (PT'S DECISION)  . PONV (postoperative nausea and vomiting)   . Psoriasis   . Tinnitus    right ear more, has tranmitter in right ear removable at hs  . Wears glasses   . Wears  partial dentures    Past Surgical History:  Procedure Laterality Date  . APPENDECTOMY    . COLECTOMY W/ COLOSTOMY  1996   W/   APPENDECTOMY  . COLONOSCOPY N/A 05/11/2018   Procedure: COLONOSCOPY;  Surgeon: Daneil Dolin, MD;  [facility];  Service: Endoscopy;  Laterality: N/A;  9:30  . COLOSTOMY TAKEDOWN  1996  . CYSTOSCOPY WITH BIOPSY N/A 12/05/2013   Procedure: CYSTO BLADDER BIOPSY AND FULGERATION;  Surgeon: Festus Aloe, MD;  Location: [facility];  Service: Urology;  Laterality: N/A;  . CYSTOSCOPY WITH BIOPSY Bilateral 11/13/2014   Procedure: CYSTOSCOPY WITH  BLADDER BIOPSY FULGERATION AND BILATERAL RETROGRADE PYELOGRAMS;  Surgeon: Festus Aloe, MD;  Location: [facility];  Service: Urology;  Laterality: Bilateral;  . CYSTOSCOPY WITH FULGERATION N/A 01/18/2018   Procedure: CYSTOSCOPY WITH FULGERATION/ BLADDER BIOPSY;  Surgeon: Festus Aloe, MD;  Location: [facility];  Service: Urology;  Laterality: N/A;  . CYSTOSCOPY WITH INJECTION N/A 11/09/2018   Procedure: CYSTOSCOPY WITH INJECTION OF INDOCYANINE GREEN DYE;  Surgeon: Alexis Frock, MD;  [facility];  Service: Urology;  Laterality: N/A;  . CYSTOSCOPY WITH INSERTION OF UROLIFT N/A 01/18/2018   Procedure: CYSTOSCOPY WITH INSERTION OF UROLIFT;  Surgeon: Festus Aloe, MD;  Location: [facility];  Service: Urology;  Laterality: N/A;  . CYSTOSCOPY/URETEROSCOPY/HOLMIUM LASER Left 02/17/2019   Procedure: URETEROSCOPY WITH BALLOON DILATION  AND NEPHROSTOGRAM;  Surgeon: Alexis Frock, MD;  Location: [facility];  Service: Urology;  Laterality: Left;  1 HR  . EXCISION RIGHT UPPER ARM LIPOMA  2005  . HEMORROIDECTOMY    . INGUINAL HERNIA REPAIR Left 1984  . IR NEPHROSTOMY PLACEMENT LEFT  01/05/2019  . KNEE ARTHROSCOPY Left X3  LAST ONE  2002  . ORIF LEFT HUMEROUS FX  1976  . POLYPECTOMY  05/11/2018   Procedure: POLYPECTOMY;  Surgeon:  Daneil Dolin, MD;  [facility];  Service: Endoscopy;;  . PROSTATE SURGERY    . TONSILLECTOMY  AS CHILD  . TOTAL KNEE ARTHROPLASTY Left 2006   REVISION 2007  (AFTER I & D WITH ANTIBIOTIC SPACER PROCEDURE FOR STEPH INFECTION)  . TOTAL KNEE ARTHROPLASTY Right 03/30/2016   Procedure: RIGHT TOTAL KNEE ARTHROPLASTY;  Surgeon: Paralee Cancel, MD;  [facility];  Service: Orthopedics;  Laterality: Right;  . TOTAL SHOULDER ARTHROPLASTY Right 04/06/2013   Procedure: RIGHT TOTAL SHOULDER ARTHROPLASTY;  Surgeon: Marin Shutter, MD;  Location: [place];  Service: Orthopedics;  Laterality: Right;  . TOTAL SHOULDER ARTHROPLASTY Left 07/20/2013   Procedure: LEFT TOTAL SHOULDER ARTHROPLASTY;  Surgeon: Marin Shutter, MD;  Location: [place];  Service: Orthopedics;  Laterality: Left;  . TRANSURETHRAL RESECTION OF BLADDER TUMOR N/A 11/12/2015   Procedure: TRANSURETHRAL RESECTION OF BLADDER TUMOR (TURBT);  Surgeon: Festus Aloe, MD;  Location: [facility];  Service: Urology;  Laterality: N/A;  . TRANSURETHRAL RESECTION OF BLADDER TUMOR WITH GYRUS (TURBT-GYRUS) N/A 12/05/2013   Procedure: TRANSURETHRAL RESECTION OF BLADDER TUMOR WITH GYRUS (TURBT-GYRUS);  Surgeon: Festus Aloe, MD;  Location: [facility];  Service: Urology;  Laterality: N/A;     A IV Location/Drains/Wounds Patient Lines/Drains/Airways Status   Active Line/Drains/Airways    Name:   Placement date:   Placement time:   Site:   Days:   Peripheral IV 06/25/19 Left Hand   06/25/19    1909    Hand   1   Peripheral IV 06/25/19 Right Forearm   06/25/19    1910    Forearm   1   Nephrostomy Left 10.2 Fr.   01/05/19    1729    Left   172   Urostomy Ileal conduit RUQ   11/09/18    1437    RUQ   229   Ureteral Drain/Stent Left ureter 7 Fr.   11/09/18    1338    Left ureter   229   Ureteral Drain/Stent Right ureter 7 Fr.   11/09/18    1400    Right ureter   229   Incision (Closed) 01/18/18 Perineum   01/18/18     0825     524   Incision (Closed) 11/09/18 Abdomen   11/09/18    1003     229   Incision (Closed) 02/17/19 Back   02/17/19    0955     129   Incision - 4 Ports Abdomen 1: Right;Lateral 2: Right;Medial 3: Left;Medial 4: Left;Lateral   11/09/18    0900     229          Intake/Output Last 24 hours  Intake/Output Summary (Last 24 hours) at 06/26/2019 0423 Last data filed at 06/25/2019 2334 Gross per 24 hour  Intake 1510.62 ml  Output --  Net 1510.62 ml    Labs/Imaging Results for orders placed or performed during the hospital encounter of 06/25/19 (from the past 48 hour(s))  Lactic acid, plasma     Status: None   Collection Time: 06/25/19  6:38 PM  Result Value Ref Range   Lactic Acid, Venous 0.8 0.5 - 1.9 mmol/L    Comment: Performed at [facility], [place] [address] [place], [place] [address]  Comprehensive metabolic panel     Status: Abnormal   Collection Time: 06/25/19  6:38 PM  Result Value Ref Range   Sodium 132 (L) 135 - 145 mmol/L   Potassium 4.2 3.5 - 5.1 mmol/L   Chloride 98 98 - 111 mmol/L   CO2 24 22 - 32 mmol/L   Glucose, Bld 113 (H) 70 - 99 mg/dL   BUN 27 (H) 8 - 23 mg/dL   Creatinine, Ser 1.50 (H) 0.61 - 1.24 mg/dL   Calcium 9.8 8.9 - 10.3 mg/dL   Total Protein 8.2 (H) 6.5 - 8.1 g/dL   Albumin 4.1 3.5 - 5.0 g/dL   AST 26 15 - 41 U/L   ALT 18 0 - 44 U/L   Alkaline Phosphatase 64 38 - 126 U/L   Total Bilirubin 1.2 0.3 - 1.2 mg/dL   GFR calc non Af Amer 46 (L) >60 mL/min   GFR calc Af Amer 54 (L) >60 mL/min   Anion gap 10 5 - 15    Comment: Performed at [facility], [place] [address] [place], [place] [address]  CBC WITH DIFFERENTIAL     Status: Abnormal   Collection Time: 06/25/19  6:38 PM  Result Value Ref Range   WBC 11.1 (H) 4.0 - 10.5 K/uL   RBC 3.95 (L) 4.22 - 5.81 MIL/uL   Hemoglobin 11.0 (L) 13.0 - 17.0 g/dL   HCT 34.6 (L) 39.0 - 52.0 %   MCV 87.6 80.0 - 100.0 fL   MCH 27.8 26.0 - 34.0 pg   MCHC 31.8 30.0 - 36.0  g/dL   RDW 16.3 (H) 11.5 - 15.5 %   Platelets 163 150 - 400 K/uL   nRBC 0.0 0.0 - 0.2 %   Neutrophils Relative % 80 %   Neutro Abs 8.8 (H) 1.7 - 7.7 K/uL   Lymphocytes Relative 9 %   Lymphs Abs 1.0 0.7 - 4.0 K/uL   Monocytes Relative 10 %   Monocytes Absolute 1.1 (H) 0.1 - 1.0 K/uL   Eosinophils Relative 1 %   Eosinophils Absolute 0.1 0.0 - 0.5 K/uL   Basophils Relative 0 %   Basophils Absolute 0.0 0.0 - 0.1 K/uL   Immature Granulocytes 0 %   Abs Immature Granulocytes 0.04 0.00 - 0.07 K/uL    Comment: Performed at [facility], [place] [address] [place], [place] [address]  APTT     Status: Abnormal   Collection Time: 06/25/19  6:38 PM  Result Value Ref Range   aPTT 42 (H) 24 - 36 seconds    Comment:        IF BASELINE aPTT IS ELEVATED, SUGGEST PATIENT RISK ASSESSMENT BE USED TO DETERMINE APPROPRIATE ANTICOAGULANT THERAPY. Performed at [facility], [place] [address], [place], [place] [address]   Protime-INR     Status: Abnormal   Collection Time: 06/25/19  6:38 PM  Result Value Ref Range   Prothrombin Time 16.0 (H) 11.4 - 15.2 seconds   INR 1.3 (H) 0.8 - 1.2    Comment: (NOTE) INR goal varies based on device and disease states. Performed at [facility], [place] [address] [place], [place] [address]   Urinalysis,  Routine w reflex microscopic     Status: Abnormal   Collection Time: 06/25/19  6:38 PM  Result Value Ref Range   Color, Urine YELLOW YELLOW   APPearance HAZY (A) CLEAR   Specific Gravity, Urine 1.012 1.005 - 1.030   pH 7.0 5.0 - 8.0   Glucose, UA NEGATIVE NEGATIVE mg/dL   Hgb urine dipstick SMALL (A) NEGATIVE   Bilirubin Urine NEGATIVE NEGATIVE   Ketones, ur NEGATIVE NEGATIVE mg/dL   Protein, ur 30 (A) NEGATIVE mg/dL   Nitrite POSITIVE (A) NEGATIVE   Leukocytes,Ua LARGE (A) NEGATIVE   RBC / HPF 6-10 0 - 5 RBC/hpf   WBC, UA >50 (H) 0 - 5 WBC/hpf   Bacteria, UA MANY (A) NONE SEEN    Comment: Performed at  [facility], [place], [place] [address] [place], [place] [address]  Blood Culture (routine x 2)     Status: None (Preliminary result)   Collection Time: 06/25/19  6:43 PM   Specimen: BLOOD RIGHT FOREARM  Result Value Ref Range   Specimen Description BLOOD RIGHT FOREARM    Special Requests      BOTTLES DRAWN AEROBIC AND ANAEROBIC Blood Culture adequate volume Performed at [place] [address], [place], [place] [address]    Culture PENDING    Report Status PENDING   Respiratory Panel by RT PCR (Flu A&B, Covid) -     Status: None   Collection Time: 06/25/19  6:54 PM  Result Value Ref Range   SARS Coronavirus 2 by RT PCR NEGATIVE NEGATIVE    Comment: (NOTE) SARS-CoV-2 target nucleic acids are NOT DETECTED. The SARS-CoV-2 RNA is generally detectable in upper respiratoy specimens during the acute phase of infection. The lowest concentration of SARS-CoV-2 viral copies this assay can detect is 131 copies/mL. A negative result does not preclude SARS-Cov-2 infection and should not be used as the sole basis for treatment or other patient management decisions. A negative result may occur with  improper specimen collection/handling, submission of specimen other than nasopharyngeal swab, presence of viral mutation(s) within the areas targeted by this assay, and inadequate number of viral copies (<131 copies/mL). A negative result must be combined with clinical observations, patient history, and epidemiological information. The expected result is Negative. Fact Sheet for Patients:  PinkCheek.be Fact Sheet for Healthcare Providers:  GravelBags.it This test is not yet ap proved or cleared by the [place] FDA and  has been authorized for detection and/or diagnosis of SARS-CoV-2 by FDA under an Emergency Use Authorization (EUA). This EUA will remain  in effect (meaning this test can be used) for the  duration of the COVID-19 declaration under Section 564(b)(1) of the Act, 21 U.S.C. section 360bbb-3(b)(1), unless the authorization is terminated or revoked sooner.    Influenza A by PCR NEGATIVE NEGATIVE   Influenza B by PCR NEGATIVE NEGATIVE    Comment: (NOTE) The Xpert Xpress SARS-CoV-2/FLU/RSV assay is intended as an aid in  the diagnosis of influenza from Nasopharyngeal swab specimens and  should not be used as a sole basis for treatment. Nasal washings and  aspirates are unacceptable for Xpert Xpress SARS-CoV-2/FLU/RSV  testing. Fact Sheet for Patients: PinkCheek.be Fact Sheet for Healthcare Providers: GravelBags.it This test is not yet approved or cleared by the [place] FDA and  has been authorized for detection and/or diagnosis of SARS-CoV-2 by  FDA under an Emergency Use Authorization (EUA). This EUA will remain  in effect (meaning this test can be used) for the  duration of the  Covid-19 declaration under Section 564(b)(1) of the Act, 21  U.S.C. section 360bbb-3(b)(1), unless the authorization is  terminated or revoked. Performed at [facility], [place] [address] [place], [place] [address]   CBC     Status: Abnormal   Collection Time: 06/25/19  9:31 PM  Result Value Ref Range   WBC 9.1 4.0 - 10.5 K/uL   RBC 3.52 (L) 4.22 - 5.81 MIL/uL   Hemoglobin 10.1 (L) 13.0 - 17.0 g/dL   HCT 31.4 (L) 39.0 - 52.0 %   MCV 89.2 80.0 - 100.0 fL   MCH 28.7 26.0 - 34.0 pg   MCHC 32.2 30.0 - 36.0 g/dL   RDW 16.7 (H) 11.5 - 15.5 %   Platelets 136 (L) 150 - 400 K/uL    Comment: REPEATED TO VERIFY   nRBC 0.0 0.0 - 0.2 %    Comment: Performed at [facility], [place] [address] [place], [place] [address]  Creatinine, serum     Status: Abnormal   Collection Time: 06/25/19  9:31 PM  Result Value Ref Range   Creatinine, Ser 1.25 (H) 0.61 - 1.24 mg/dL   GFR calc non Af Amer 58 (L) >60 mL/min    GFR calc Af Amer >60 >60 mL/min    Comment: Performed at [facility], [place] [address], [place], [place] [address]   DG Chest Port 1 View  Result Date: 06/25/2019 CLINICAL DATA:  Sepsis EXAM: PORTABLE CHEST 1 VIEW COMPARISON:  April 09, 2019 FINDINGS: The patient is status post prior total shoulder arthroplasty. The heart size is stable from prior study. There is no pneumothorax. No large pleural effusion. There is no focal infiltrate. IMPRESSION: No active disease. Electronically Signed   By: Constance Holster M.D.   On: 06/25/2019 19:26    Pending Labs Unresulted Labs (From admission, onward)    Start     Ordered   07/02/19 0500  Creatinine, serum  (enoxaparin (LOVENOX)    CrCl >/= 30 ml/min)  Weekly,   R    Comments: while on enoxaparin therapy    06/25/19 2132   06/26/19 [id number]  Basic metabolic panel  Tomorrow morning,   R     06/25/19 2132   06/26/19 0500  CBC  Tomorrow morning,   R     06/25/19 2132   06/25/19 1838  Blood Culture (routine x 2)  BLOOD CULTURE X 2,   STAT     06/25/19 1838   06/25/19 1838  Urine culture  ONCE - STAT,   STAT     06/25/19 1838          Vitals/Pain Today's Vitals   06/26/19 0215 06/26/19 0245 06/26/19 0300 06/26/19 0311  BP: 114/67 (!) 104/52 (!) 114/53   Pulse: 66 71 68   Resp: 17 19 18    Temp:    98 F (36.7 C)  TempSrc:    Oral  SpO2: 98% 96% 95%   Weight:      Height:      PainSc:        Isolation Precautions No active isolations  Medications Medications  enoxaparin (LOVENOX) injection 40 mg (40 mg Subcutaneous Given 06/25/19 2332)  sodium chloride flush (NS) 0.9 % injection 3 mL (3 mLs Intravenous Given 06/25/19 2334)  sodium chloride flush (NS) 0.9 % injection 3 mL (has no administration in time range)  0.9 %  sodium chloride infusion (has no administration in time range)  acetaminophen (  TYLENOL) tablet 650 mg (has no administration in time range)    Or  acetaminophen (TYLENOL) suppository 650 mg (has no  administration in time range)  ondansetron (ZOFRAN) tablet 4 mg (has no administration in time range)    Or  ondansetron (ZOFRAN) injection 4 mg (has no administration in time range)  meropenem (MERREM) 1 g in sodium chloride 0.9 % 100 mL IVPB (has no administration in time range)  vancomycin (VANCOCIN) IVPB 1000 mg/200 mL premix (has no administration in time range)  amLODipine (NORVASC) tablet 10 mg (has no administration in time range)  rosuvastatin (CRESTOR) tablet 10 mg (has no administration in time range)  senna-docusate (Senokot-S) tablet 1 tablet (has no administration in time range)  polyvinyl alcohol (LIQUIFILM TEARS) 1.4 % ophthalmic solution 1 drop (has no administration in time range)  multivitamin with minerals tablet 1 tablet (has no administration in time range)  sodium chloride 0.9 % bolus 1,000 mL (0 mLs Intravenous Stopped 06/25/19 2046)  acetaminophen (TYLENOL) tablet 650 mg (650 mg Oral Given 06/25/19 1946)  meropenem (MERREM) 1 g in sodium chloride 0.9 % 100 mL IVPB (0 g Intravenous Stopped 06/25/19 2153)  vancomycin (VANCOREADY) IVPB 2000 mg/400 mL (0 mg Intravenous Stopped 06/25/19 2228)    Mobility walks Low fall risk   Focused Assessments  R Recommendations: See Admitting Provider Note  Report given to:   Additional Notes:

## 2019-06-26 NOTE — ED Notes (Signed)
WILL TRANSPORT PT TO 6E 1609-1. AAOX4. PT IN NO APPARENT DISTRESS WITH MILD PAIN. THE OPPORTUNITY TO ASK QUESTIONS WAS PROVIDED.

## 2019-06-27 ENCOUNTER — Other Ambulatory Visit: Payer: Self-pay | Admitting: Urology

## 2019-06-27 ENCOUNTER — Ambulatory Visit: Payer: Self-pay | Admitting: *Deleted

## 2019-06-27 LAB — BASIC METABOLIC PANEL
Anion gap: 8 (ref 5–15)
BUN: 23 mg/dL (ref 8–23)
CO2: 26 mmol/L (ref 22–32)
Calcium: 9.7 mg/dL (ref 8.9–10.3)
Chloride: 102 mmol/L (ref 98–111)
Creatinine, Ser: 1.35 mg/dL — ABNORMAL HIGH (ref 0.61–1.24)
GFR calc Af Amer: >60 mL/min (ref >60)
GFR calc non Af Amer: 52 mL/min — ABNORMAL LOW (ref >60)
Glucose, Bld: 88 mg/dL (ref 70–99)
Potassium: 4.2 mmol/L (ref 3.5–5.1)
Sodium: 136 mmol/L (ref 135–145)

## 2019-06-27 NOTE — Chronic Care Management (AMB) (Signed)
  Chronic Care Management   Note  06/27/2019 Name: Billy Rasmussen MRN: [id number] DOB: August 06, 1945  Received in-basket message that [name] is currently inpatient for sepsis due to UTI. Chart reviewed and notation made to follow-up with patient after discharge.   Follow up plan: RN CCM will reach out to patient within 7 days of discharge.   Chong Sicilian, BSN, RN-BC Embedded Chronic Care Manager [facility] [place] [facility] Direct Dial: [phone]

## 2019-06-27 NOTE — Plan of Care (Signed)
  Problem: Urinary Elimination: Goal: Signs and symptoms of infection will decrease Outcome: Progressing   

## 2019-06-27 NOTE — Consult Note (Signed)
   [facility] Inpatient Consult   06/27/2019  KAHIL HEIS 1945/07/06 [id number]   Patient screened for potential [facility] Care Management (CM) services due to unplanned readmission risk score of 30% and multiple hospitalizations.   Per chart review, patient's primary provider office, [place], offers embedded chronic care management services.  Notified [facility] embedded care manager of patient disposition.   Netta Cedars, MSN, [facility] Liaison Nurse Mobile Phone [phone]  Toll free office [phone]

## 2019-06-27 NOTE — Patient Instructions (Signed)
  Follow up plan: RN CCM will reach out to patient within 7 days of discharge.   Chong Sicilian, BSN, RN-BC Embedded Chronic Care Manager [facility] [place] [facility] Direct Dial: [phone]

## 2019-06-27 NOTE — Progress Notes (Signed)
Dr Tresa Moore has graciously agreed to take over as attending on Billy Rasmussen. [facility] will sign off. I will transition the attending in [place].   Debbe Odea, MD

## 2019-06-27 NOTE — Consult Note (Signed)
Reason for Consult:Recurrent Pyelonephristis, bladder cancer.   Referring Physician: America Brown MD  Billy Rasmussen is an 74 y.o. male.   HPI:   1 - Recurrent High Grade Bladder Cancer - s/p robotic cystoprostatectomy with ICG sentinal + template lymphadenctomy, extensive adhesions, and conduit diversion 10/2018 for [id number] cancer with NEGATIVE margins. BCG refractory disease prior. Urethra remains in site.   Post-Cystectomy Surveillance:  - 03/2019 CT no recurrence.   2 -Recurrent Pyelonephritis - treated 11/2018 and again 12/2018, 01/2019, and now 03/2019 for suspect pyelo on eval fevers and malaise. UCX 12/2018 pseudomonas pan-sensitive (cipro, gent). Billy Rasmussen 03/16/19 pending, placed on empiric Cefipime. UCX 05/2019 Klebsiella, Psuedomonas, Eterococcus sens Cipro + Amp or Nitro.   3 - Left Hydronephrosis Distal Partial Ureteral Stricture / Atrophic Left Kidney - mild left hydro on ER CT 12/2018 after cystoprostatectomy. Pyelo at time and therefore left neph tube placed. Antegrade ureteroscopy confirmed left distal partial stricture (long segemnt, appears to be from retrocolic tunnel to conduit, not anastamotic) and balloon dilated / antegrade stent placed and then later removed 03/2019. Recurrent hydro by [place] 05/2019 suggesting stricture recurrence. Renogram 06/2019 with minimal left renal function 11%. 1 artery (lower acessory) 1 vein (dual gonadals) left renovascular anatomy and open retroperitoneal window by CT 05/2019.   PMH sig for obesity, partial colectomy / colostomy / colostomy take down for diverticulitis mid 1990s (pre-cystectomy) , bilateral total knee, bilateral shoulder surgery, lipoma removal x several. His PCP is Billy Nearing PA.   Today " Billy Rasmussen " is seen in consultation for above. He is unfortunatley hospitalized again for suspected pyelo. Recent renogram confrims left kidney essentially non-functional.    Past Medical History:  Diagnosis Date  . Arthritis    knees,  shoulders, elbows  . Bladder cancer [facility])    urologist-  dr Junious Silk  . Diverticulosis of colon   . Fibromyalgia   . GERD (gastroesophageal reflux disease)   . History of diverticulitis of colon   . History of gastric ulcer    due to aleve  . Hypertension   . Lower urinary tract symptoms (LUTS)   . OSA (obstructive sleep apnea)    NON-COMPLIANT  CPAP  --- BUT PT USES OXYGEN AT NIGHT 2.5L VIA [place] (PT'S DECISION)  . PONV (postoperative nausea and vomiting)   . Psoriasis   . Tinnitus    right ear more, has tranmitter in right ear removable at hs  . Wears glasses   . Wears partial dentures     Past Surgical History:  Procedure Laterality Date  . APPENDECTOMY    . COLECTOMY W/ COLOSTOMY  1996   W/   APPENDECTOMY  . COLONOSCOPY N/A 05/11/2018   Procedure: COLONOSCOPY;  Surgeon: Daneil Dolin, MD;  [facility];  Service: Endoscopy;  Laterality: N/A;  9:30  . COLOSTOMY TAKEDOWN  1996  . CYSTOSCOPY WITH BIOPSY N/A 12/05/2013   Procedure: CYSTO BLADDER BIOPSY AND FULGERATION;  Surgeon: Festus Aloe, MD;  Location: [facility];  Service: Urology;  Laterality: N/A;  . CYSTOSCOPY WITH BIOPSY Bilateral 11/13/2014   Procedure: CYSTOSCOPY WITH  BLADDER BIOPSY FULGERATION AND BILATERAL RETROGRADE PYELOGRAMS;  Surgeon: Festus Aloe, MD;  Location: [facility];  Service: Urology;  Laterality: Bilateral;  . CYSTOSCOPY WITH FULGERATION N/A 01/18/2018   Procedure: CYSTOSCOPY WITH FULGERATION/ BLADDER BIOPSY;  Surgeon: Festus Aloe, MD;  [facility];  Service: Urology;  Laterality: N/A;  . CYSTOSCOPY WITH INJECTION N/A 11/09/2018  Procedure: CYSTOSCOPY WITH INJECTION OF INDOCYANINE GREEN DYE;  Surgeon: Alexis Frock, MD;  [facility];  Service: Urology;  Laterality: N/A;  . CYSTOSCOPY WITH INSERTION OF UROLIFT N/A 01/18/2018   Procedure: CYSTOSCOPY WITH INSERTION OF UROLIFT;  Surgeon: Festus Aloe, MD;  Location:  [facility];  Service: Urology;  Laterality: N/A;  . CYSTOSCOPY/URETEROSCOPY/HOLMIUM LASER Left 02/17/2019   Procedure: URETEROSCOPY WITH BALLOON DILATION  AND NEPHROSTOGRAM;  Surgeon: Alexis Frock, MD;  Location: [facility];  Service: Urology;  Laterality: Left;  1 HR  . EXCISION RIGHT UPPER ARM LIPOMA  2005  . HEMORROIDECTOMY    . INGUINAL HERNIA REPAIR Left 1984  . IR NEPHROSTOMY PLACEMENT LEFT  01/05/2019  . KNEE ARTHROSCOPY Left X3  LAST ONE  2002  . ORIF LEFT HUMEROUS FX  1976  . POLYPECTOMY  05/11/2018   Procedure: POLYPECTOMY;  Surgeon: Daneil Dolin, MD;  [facility];  Service: Endoscopy;;  . PROSTATE SURGERY    . TONSILLECTOMY  AS CHILD  . TOTAL KNEE ARTHROPLASTY Left 2006   REVISION 2007  (AFTER I & D WITH ANTIBIOTIC SPACER PROCEDURE FOR STEPH INFECTION)  . TOTAL KNEE ARTHROPLASTY Right 03/30/2016   Procedure: RIGHT TOTAL KNEE ARTHROPLASTY;  Surgeon: Paralee Cancel, MD;  [facility];  Service: Orthopedics;  Laterality: Right;  . TOTAL SHOULDER ARTHROPLASTY Right 04/06/2013   Procedure: RIGHT TOTAL SHOULDER ARTHROPLASTY;  Surgeon: Marin Shutter, MD;  Location: [place];  Service: Orthopedics;  Laterality: Right;  . TOTAL SHOULDER ARTHROPLASTY Left 07/20/2013   Procedure: LEFT TOTAL SHOULDER ARTHROPLASTY;  Surgeon: Marin Shutter, MD;  Location: [place];  Service: Orthopedics;  Laterality: Left;  . TRANSURETHRAL RESECTION OF BLADDER TUMOR N/A 11/12/2015   Procedure: TRANSURETHRAL RESECTION OF BLADDER TUMOR (TURBT);  Surgeon: Festus Aloe, MD;  Location: [facility];  Service: Urology;  Laterality: N/A;  . TRANSURETHRAL RESECTION OF BLADDER TUMOR WITH GYRUS (TURBT-GYRUS) N/A 12/05/2013   Procedure: TRANSURETHRAL RESECTION OF BLADDER TUMOR WITH GYRUS (TURBT-GYRUS);  Surgeon: Festus Aloe, MD;  Location: [facility];  Service: Urology;  Laterality: N/A;    Family History  Problem Relation Age of Onset  .  Alzheimer's disease Mother   . Heart attack Father   . Heart disease Father   . Irregular heart beat Brother        DEFIB.  . Congestive Heart Failure Brother   . Diabetes Daughter     Social History:  reports that he quit smoking about 22 years ago. His smoking use included cigarettes. He has a 80.00 pack-year smoking history. He has never used smokeless tobacco. He reports that he does not drink alcohol or use drugs.  Allergies: No Known Allergies  Medications: I have reviewed the patient's current medications.  Results for orders placed or performed during the hospital encounter of 06/25/19 (from the past 48 hour(s))  Lactic acid, plasma     Status: None   Collection Time: 06/25/19  6:38 PM  Result Value Ref Range   Lactic Acid, Venous 0.8 0.5 - 1.9 mmol/L    Comment: Performed at [facility], [place] [address], [place], [place] [address]  Comprehensive metabolic panel     Status: Abnormal   Collection Time: 06/25/19  6:38 PM  Result Value Ref Range   Sodium 132 (L) 135 - 145 mmol/L   Potassium 4.2 3.5 - 5.1 mmol/L   Chloride 98 98 - 111 mmol/L   CO2 24 22 -  32 mmol/L   Glucose, Bld 113 (H) 70 - 99 mg/dL   BUN 27 (H) 8 - 23 mg/dL   Creatinine, Ser 1.50 (H) 0.61 - 1.24 mg/dL   Calcium 9.8 8.9 - 10.3 mg/dL   Total Protein 8.2 (H) 6.5 - 8.1 g/dL   Albumin 4.1 3.5 - 5.0 g/dL   AST 26 15 - 41 U/L   ALT 18 0 - 44 U/L   Alkaline Phosphatase 64 38 - 126 U/L   Total Bilirubin 1.2 0.3 - 1.2 mg/dL   GFR calc non Af Amer 46 (L) >60 mL/min   GFR calc Af Amer 54 (L) >60 mL/min   Anion gap 10 5 - 15    Comment: Performed at [facility], [place] [address], [place], [place] [address]  CBC WITH DIFFERENTIAL     Status: Abnormal   Collection Time: 06/25/19  6:38 PM  Result Value Ref Range   WBC 11.1 (H) 4.0 - 10.5 K/uL   RBC 3.95 (L) 4.22 - 5.81 MIL/uL   Hemoglobin 11.0 (L) 13.0 - 17.0 g/dL   HCT 34.6 (L) 39.0 - 52.0 %   MCV 87.6 80.0 - 100.0 fL    MCH 27.8 26.0 - 34.0 pg   MCHC 31.8 30.0 - 36.0 g/dL   RDW 16.3 (H) 11.5 - 15.5 %   Platelets 163 150 - 400 K/uL   nRBC 0.0 0.0 - 0.2 %   Neutrophils Relative % 80 %   Neutro Abs 8.8 (H) 1.7 - 7.7 K/uL   Lymphocytes Relative 9 %   Lymphs Abs 1.0 0.7 - 4.0 K/uL   Monocytes Relative 10 %   Monocytes Absolute 1.1 (H) 0.1 - 1.0 K/uL   Eosinophils Relative 1 %   Eosinophils Absolute 0.1 0.0 - 0.5 K/uL   Basophils Relative 0 %   Basophils Absolute 0.0 0.0 - 0.1 K/uL   Immature Granulocytes 0 %   Abs Immature Granulocytes 0.04 0.00 - 0.07 K/uL    Comment: Performed at [facility], [place] [address] [place], [place] [address]  APTT     Status: Abnormal   Collection Time: 06/25/19  6:38 PM  Result Value Ref Range   aPTT 42 (H) 24 - 36 seconds    Comment:        IF BASELINE aPTT IS ELEVATED, SUGGEST PATIENT RISK ASSESSMENT BE USED TO DETERMINE APPROPRIATE ANTICOAGULANT THERAPY. Performed at [facility], [place] [address] [place], [place] [address]   Protime-INR     Status: Abnormal   Collection Time: 06/25/19  6:38 PM  Result Value Ref Range   Prothrombin Time 16.0 (H) 11.4 - 15.2 seconds   INR 1.3 (H) 0.8 - 1.2    Comment: (NOTE) INR goal varies based on device and disease states. Performed at [facility], [place] [address] [place], [place], [place] [address]   Urinalysis, Routine w reflex microscopic     Status: Abnormal   Collection Time: 06/25/19  6:38 PM  Result Value Ref Range   Color, Urine YELLOW YELLOW   APPearance HAZY (A) CLEAR   Specific Gravity, Urine 1.012 1.005 - 1.030   pH 7.0 5.0 - 8.0   Glucose, UA NEGATIVE NEGATIVE mg/dL   Hgb urine dipstick SMALL (A) NEGATIVE   Bilirubin Urine NEGATIVE NEGATIVE   Ketones, ur NEGATIVE NEGATIVE mg/dL   Protein, ur 30 (A) NEGATIVE mg/dL   Nitrite POSITIVE (A) NEGATIVE   Leukocytes,Ua LARGE (A) NEGATIVE   RBC /  HPF 6-10 0 - 5 RBC/hpf   WBC, UA >50 (H) 0 - 5 WBC/hpf   Bacteria,  UA MANY (A) NONE SEEN    Comment: Performed at [facility], [place] [address], [place], [place] [address]  Urine culture     Status: None (Preliminary result)   Collection Time: 06/25/19  6:38 PM   Specimen: In/Out Cath Urine  Result Value Ref Range   Specimen Description      IN/OUT CATH URINE Performed at [facility], [place] [address], [place], [place] [address]    Special Requests      NONE Performed at [facility], [place] [address] [place], [place] [address]    Culture      CULTURE REINCUBATED FOR BETTER GROWTH Performed at [facility], [place] [address], [place], [place] [address]    Report Status PENDING   Blood Culture (routine x 2)     Status: None (Preliminary result)   Collection Time: 06/25/19  6:43 PM   Specimen: BLOOD RIGHT FOREARM  Result Value Ref Range   Specimen Description BLOOD RIGHT FOREARM    Special Requests      BOTTLES DRAWN AEROBIC AND ANAEROBIC Blood Culture adequate volume Performed at [place] [address] [place], [place] [address]    Culture PENDING    Report Status PENDING   Respiratory Panel by RT PCR (Flu A&B, Covid) -     Status: None   Collection Time: 06/25/19  6:54 PM  Result Value Ref Range   SARS Coronavirus 2 by RT PCR NEGATIVE NEGATIVE    Comment: (NOTE) SARS-CoV-2 target nucleic acids are NOT DETECTED. The SARS-CoV-2 RNA is generally detectable in upper respiratoy specimens during the acute phase of infection. The lowest concentration of SARS-CoV-2 viral copies this assay can detect is 131 copies/mL. A negative result does not preclude SARS-Cov-2 infection and should not be used as the sole basis for treatment or other patient management decisions. A negative result may occur with  improper specimen collection/handling, submission of specimen other than nasopharyngeal swab, presence of viral mutation(s) within the areas targeted by this assay, and  inadequate number of viral copies (<131 copies/mL). A negative result must be combined with clinical observations, patient history, and epidemiological information. The expected result is Negative. Fact Sheet for Patients:  PinkCheek.be Fact Sheet for Healthcare Providers:  GravelBags.it This test is not yet ap proved or cleared by the [place] FDA and  has been authorized for detection and/or diagnosis of SARS-CoV-2 by FDA under an Emergency Use Authorization (EUA). This EUA will remain  in effect (meaning this test can be used) for the duration of the COVID-19 declaration under Section 564(b)(1) of the Act, 21 U.S.C. section 360bbb-3(b)(1), unless the authorization is terminated or revoked sooner.    Influenza A by PCR NEGATIVE NEGATIVE   Influenza B by PCR NEGATIVE NEGATIVE    Comment: (NOTE) The Xpert Xpress SARS-CoV-2/FLU/RSV assay is intended as an aid in  the diagnosis of influenza from Nasopharyngeal swab specimens and  should not be used as a sole basis for treatment. Nasal washings and  aspirates are unacceptable for Xpert Xpress SARS-CoV-2/FLU/RSV  testing. Fact Sheet for Patients: PinkCheek.be Fact Sheet for Healthcare Providers: GravelBags.it This test is not yet approved or cleared by the [place] FDA and  has been authorized for detection and/or diagnosis of SARS-CoV-2 by  FDA under an Emergency Use Authorization (EUA). This EUA will remain  in effect (meaning this test can be used) for the duration of the  Covid-19 declaration under Section 564(b)(1) of the Act, 21  U.S.C. section 360bbb-3(b)(1), unless the authorization is  terminated or revoked. Performed at [facility], [place] [address], [place], [place] [address]   CBC     Status: Abnormal   Collection Time: 06/25/19  9:31 PM  Result Value Ref Range   WBC 9.1 4.0 -  10.5 K/uL   RBC 3.52 (L) 4.22 - 5.81 MIL/uL   Hemoglobin 10.1 (L) 13.0 - 17.0 g/dL   HCT 31.4 (L) 39.0 - 52.0 %   MCV 89.2 80.0 - 100.0 fL   MCH 28.7 26.0 - 34.0 pg   MCHC 32.2 30.0 - 36.0 g/dL   RDW 16.7 (H) 11.5 - 15.5 %   Platelets 136 (L) 150 - 400 K/uL    Comment: REPEATED TO VERIFY   nRBC 0.0 0.0 - 0.2 %    Comment: Performed at [facility], [place] [address], [place], [place] [address]  Creatinine, serum     Status: Abnormal   Collection Time: 06/25/19  9:31 PM  Result Value Ref Range   Creatinine, Ser 1.25 (H) 0.61 - 1.24 mg/dL   GFR calc non Af Amer 58 (L) >60 mL/min   GFR calc Af Amer >60 >60 mL/min    Comment: Performed at [facility], [place] [address], [place], [place] [id number]  Basic metabolic panel     Status: Abnormal   Collection Time: 06/26/19  5:11 AM  Result Value Ref Range   Sodium 137 135 - 145 mmol/L   Potassium 3.9 3.5 - 5.1 mmol/L   Chloride 103 98 - 111 mmol/L   CO2 25 22 - 32 mmol/L   Glucose, Bld 86 70 - 99 mg/dL   BUN 25 (H) 8 - 23 mg/dL   Creatinine, Ser 1.40 (H) 0.61 - 1.24 mg/dL   Calcium 9.8 8.9 - 10.3 mg/dL   GFR calc non Af Amer 50 (L) >60 mL/min   GFR calc Af Amer 58 (L) >60 mL/min   Anion gap 9 5 - 15    Comment: Performed at [place] [address], [place], [place] [address]  CBC     Status: Abnormal   Collection Time: 06/26/19  5:11 AM  Result Value Ref Range   WBC 8.3 4.0 - 10.5 K/uL   RBC 4.19 (L) 4.22 - 5.81 MIL/uL   Hemoglobin 11.8 (L) 13.0 - 17.0 g/dL   HCT 36.9 (L) 39.0 - 52.0 %   MCV 88.1 80.0 - 100.0 fL   MCH 28.2 26.0 - 34.0 pg   MCHC 32.0 30.0 - 36.0 g/dL   RDW 16.5 (H) 11.5 - 15.5 %   Platelets 128 (L) 150 - 400 K/uL   nRBC 0.0 0.0 - 0.2 %    Comment: Performed at [facility], [place] [address], [place], [place] [id number]  Basic metabolic panel     Status: Abnormal   Collection Time: 06/27/19  4:14 AM  Result Value Ref Range   Sodium 136 135  - 145 mmol/L   Potassium 4.2 3.5 - 5.1 mmol/L   Chloride 102 98 - 111 mmol/L   CO2 26 22 - 32 mmol/L   Glucose, Bld 88 70 - 99 mg/dL   BUN 23 8 - 23 mg/dL   Creatinine, Ser 1.35 (H) 0.61 - 1.24 mg/dL   Calcium 9.7 8.9 - 10.3 mg/dL  GFR calc non Af Amer 52 (L) >60 mL/min   GFR calc Af Amer >60 >60 mL/min   Anion gap 8 5 - 15    Comment: Performed at [facility], [place] [address] [place], [place] [address]    DG Chest Port 1 View  Result Date: 06/25/2019 CLINICAL DATA:  Sepsis EXAM: PORTABLE CHEST 1 VIEW COMPARISON:  April 09, 2019 FINDINGS: The patient is status post prior total shoulder arthroplasty. The heart size is stable from prior study. There is no pneumothorax. No large pleural effusion. There is no focal infiltrate. IMPRESSION: No active disease. Electronically Signed   By: Constance Holster M.D.   On: 06/25/2019 19:26    Review of Systems  Constitutional: Positive for fatigue.  All other systems reviewed and are negative.  Blood pressure (!) 141/78, pulse 79, temperature 98.9 F (37.2 C), temperature source Oral, resp. rate 16, height 5\' 7"  (1.702 m), weight 96.2 kg, SpO2 98 %. Physical Exam  Constitutional: He appears well-developed.  HENT:  Head: Normocephalic.  Eyes: Pupils are equal, round, and reactive to light.  Cardiovascular: Normal rate.  GI: Soft.  RLQ Urostomy pink / patent with non-foul urine.   Genitourinary:    Genitourinary Comments: NO CVAT at present.    Musculoskeletal:        General: Normal range of motion.     Cervical back: Normal range of motion.  Neurological: He is alert.  Skin: Skin is warm.  Psychiatric: He has a normal mood and affect.    Assessment/Plan:  Reinforced good prognosis with margin-negative node-negative cystectomy for superficial disease and post-op surveillance protocol with Q37mo CMP, CXR, CT, Urethroscopy x 2 years (10/2018) then yearly (10/2026).   Left distal stricture now failed dilation  clincally. This is frustrating but not surprising. Rec continue dialy nitrofurantoin proph. Hs left kidney is now essentially non-functional. Most definitive means of management would be left nephretomy. Rec left retroperitoneal robtoic given prior surgical history as optimal approach. Risks, benefits, alternaitves, expectd peri-op course discussed. We will schedule for elective setting.   Pt OK for DC home when afebriel x 24 hours from Urol perspective. Appreciate Dr. Reggy Eye comanagment. Please call me directly with questions anytime.      Alexis Frock 06/27/2019, 7:49 AM

## 2019-06-28 LAB — URINE CULTURE: Culture: >=100000 — AB

## 2019-06-28 MED ORDER — AMPICILLIN 500 MG PO CAPS: 500.0000 mg | ORAL_CAPSULE | Freq: Three times a day (TID) | ORAL | 0 refills | Status: DC

## 2019-06-28 MED ORDER — CIPROFLOXACIN HCL 500 MG PO TABS: 500.0000 mg | ORAL_TABLET | Freq: Two times a day (BID) | ORAL | 0 refills | Status: AC

## 2019-06-28 NOTE — Care Management Important Message (Signed)
Important Message  Patient Details IM Letter given to [name] RN Case Manager to present to the Patient Name: Billy Rasmussen MRN: [id number] Date of Birth: [birth year]   Medicare Important Message Given:  Yes     [name] 06/28/2019, 9:42 AM

## 2019-06-28 NOTE — Progress Notes (Signed)
Reviewed discharge paperwork, medication regimen, and follow up appointments with patient. Patient discharged via wheelchair by NT.

## 2019-06-28 NOTE — Discharge Instructions (Signed)
1 - Call MD or go to ER for fever >102, severe pain / nausea / vomiting not relieved by medications, or acute change in medical status  

## 2019-06-28 NOTE — Discharge Summary (Signed)
Physician Discharge Summary  Patient ID: Billy Rasmussen MRN: [id number] DOB/AGE: May 25, 1946 74 y.o.  Admit date: 06/25/2019 Discharge date: 06/28/2019  Admission Diagnoses:  Discharge Diagnoses:  Principal Problem:   UTI (urinary tract infection) Active Problems:   HTN (hypertension)   Bladder cancer (HCC)   AKI (acute kidney injury) ([place])   OSA (obstructive sleep apnea)   Discharged Condition: good  Hospital Course: Pt admitted through ER 06/25/19 with clinical pyelonephritis which has been recurrent. Started [place] and [place] awaiting further CX data. Fevers quickly resolved and final CX data Serratia and Klebsiella sens to Cipro and Amp which he will be discharged on.   Consults: Urology, Hospitalist  Significant Diagnostic Studies: labs: as per above  Treatments: IV antibiotics as per above  Discharge Exam: Blood pressure 132/60, pulse 60, temperature 98.1 F (36.7 C), temperature source Oral, resp. rate 18, height 5\' 7"  (1.702 m), weight 96.2 kg, SpO2 97 %. General appearance: alert, cooperative and appears stated age Ears: normal TM's and external ear canals both ears Nose: Nares normal. Septum midline. Mucosa normal. No drainage or sinus tenderness. Throat: lips, mucosa, and tongue normal; teeth and gums normal Neck: supple, symmetrical, trachea midline Back: symmetric, no curvature. ROM normal. No CVA tenderness. Resp: non-labored on RA Cardio: Nl rate GI: soft, non-tender; bowel sounds normal; no masses,  no organomegaly and RLQ Urostomy pink and patent of copious non-foul urine.  Extremities: extremities normal, atraumatic, no cyanosis or edema Pulses: 2+ and symmetric Skin: Skin color, texture, turgor normal. No rashes or lesions Lymph nodes: Cervical, supraclavicular, and axillary nodes normal. Neurologic: Grossly normal  Disposition:  HOME  Allergies as of 06/28/2019   No Known Allergies     Medication List    TAKE these medications   amLODipine 10 MG  tablet Commonly known as: NORVASC Take 1 tablet (10 mg total) by mouth daily.   ampicillin 500 MG capsule Commonly known as: PRINCIPEN Take 1 capsule (500 mg total) by mouth 3 (three) times daily. X 5 days now. Also begin 2 days before next Urology surgery.   atorvastatin 20 MG tablet Commonly known as: LIPITOR Take 1 tablet (20 mg total) by mouth daily.   ciprofloxacin 500 MG tablet Commonly known as: Cipro Take 1 tablet (500 mg total) by mouth 2 (two) times daily for 7 days. (5 days now, and begin 2 days before next urology sugery)   co-enzyme Q-10 30 MG capsule Take 50 mg by mouth daily.   diclofenac 75 MG EC tablet Commonly known as: VOLTAREN TAKE 1 TABLET BY MOUTH TWICE A DAY   docusate sodium 100 MG capsule Commonly known as: COLACE Take 200 mg by mouth daily.   ferrous sulfate 325 (65 FE) MG tablet Take 1 tablet (325 mg total) by mouth daily with breakfast.   lisinopril 5 MG tablet Commonly known as: ZESTRIL Take 1 tablet (5 mg total) by mouth daily.   multivitamin with minerals Tabs tablet Take 1 tablet by mouth daily.   nitrofurantoin 100 MG capsule Commonly known as: MACRODANTIN Take 100 mg by mouth daily.   polyvinyl alcohol 1.4 % ophthalmic solution Commonly known as: LIQUIFILM TEARS Place 1 drop into both eyes as needed for dry eyes.   rosuvastatin 10 MG tablet Commonly known as: CRESTOR Take 1 tablet (10 mg total) by mouth daily.   senna-docusate 8.6-50 MG tablet Commonly known as: Senokot-S Take 1 tablet by mouth daily as needed for mild constipation.      Follow-up Information  Alexis Frock, MD Follow up.   Specialty: Urology Why: as scheduled for next surgery.  Contact information: [place] [id number] [phone]           Signed: Alexis Frock 06/28/2019, 11:45 AM

## 2019-06-30 ENCOUNTER — Ambulatory Visit: Payer: Medicare Other | Admitting: *Deleted

## 2019-06-30 NOTE — Chronic Care Management (AMB) (Signed)
   Care Management   Note  06/30/2019 Name: Billy Rasmussen MRN: [id number] DOB: 1945/07/05  I spoke with [name] by telephone today. He was discharged from the hospital on 06/28/2019 and is scheduled for a nephrectomy on 07/14/19. He is taking his antibiotics without any complaints. His symptoms have resolved and he is feeling better. He will follow-up with nephrology and urology as planned.   [name] has not consented to CCM services but the CCM team will remain available for assistance in the future if that changes.   Follow up plan: Patient will f/u as needed  Chong Sicilian, BSN, RN-BC [place] [facility] [place] Management Direct Dial: [phone]

## 2019-07-01 LAB — CULTURE, BLOOD (ROUTINE X 2)
Culture: NO GROWTH
Culture: NO GROWTH
Special Requests: ADEQUATE
Special Requests: ADEQUATE

## 2019-07-05 ENCOUNTER — Telehealth: Payer: Medicare Other

## 2019-07-05 NOTE — Patient Instructions (Addendum)
DUE TO COVID-19 ONLY ONE VISITOR IS ALLOWED TO COME WITH YOU AND STAY IN THE WAITING ROOM ONLY DURING PRE OP AND PROCEDURE DAY OF SURGERY. THE 1 VISITOR MAY VISIT WITH YOU AFTER SURGERY IN YOUR PRIVATE ROOM DURING VISITING HOURS ONLY!  YOU NEED TO HAVE A COVID 19 TEST ON___2/9/2021____ @___10 :00AM____, THIS TEST MUST BE DONE BEFORE SURGERY, COME  [address] [place] , [id number].  ([place]) ONCE YOUR COVID TEST IS COMPLETED, PLEASE BEGIN THE QUARANTINE INSTRUCTIONS AS OUTLINED IN YOUR HANDOUT.                Billy Rasmussen   Your procedure is scheduled on: 07/14/2019   Report to [facility] Main  Entrance   Report to [place] at 5:30AM     Call this number if you have problems the morning of surgery [phone]    Remember: Do not eat food After Midnight on Wednesday February 10th  . Drink plenty of clear liquids (see menu below) all day Thursday February 11th  and follow all bowel prep orders provided by your surgeon. Nothing by mouth after midnight on Thursday!     CLEAR LIQUID DIET   Foods Allowed                                                                     Foods Excluded  Coffee and tea, regular and decaf                             liquids that you cannot  Plain Jell-O any favor except red or purple                                           see through such as: Fruit ices (not with fruit pulp)                                     milk, soups, orange juice  Iced Popsicles                                    All solid food Carbonated beverages, regular and diet                                    Cranberry, grape and apple juices Sports drinks like Gatorade Lightly seasoned clear broth or consume(fat free) Sugar, honey syrup  Sample Menu Breakfast                                Lunch                                     Supper Cranberry juice  Beef broth                            Chicken broth Jell-O                                      Grape juice                           Apple juice Coffee or tea                        Jell-O                                      Popsicle                                                Coffee or tea                        Coffee or tea  _____________________________________________________________________   BRUSH YOUR TEETH MORNING OF SURGERY AND RINSE YOUR MOUTH OUT, NO CHEWING GUM CANDY OR MINTS.     Take these medicines the morning of surgery with A SIP OF WATER: AMLODIPINE, ROSUVASTATIN                                  You may not have any metal on your body including hair pins and              piercings  Do not wear jewelry, make-up, lotions, powders or perfumes, deodorant                        Men may shave face and neck.     Do not bring valuables to the hospital. [place].  Contacts, dentures or bridgework may not be worn into surgery.  YOU MAY BRING A SMALL OVERNIGHT BAG                 Please read over the following fact sheets you were given: _____________________________________________________________________            [facility] - Preparing for Surgery Before surgery, you can play an important role.  Because skin is not sterile, your skin needs to be as free of germs as possible.  You can reduce the number of germs on your skin by washing with CHG (chlorahexidine gluconate) soap before surgery.  CHG is an antiseptic cleaner which kills germs and bonds with the skin to continue killing germs even after washing. Please DO NOT use if you have an allergy to CHG or antibacterial soaps.  If your skin becomes reddened/irritated stop using the CHG and inform your nurse when you arrive at Short Stay. Do not shave (including legs and underarms) for at least 48 hours prior to the first CHG shower.  You may shave your face/neck. Please follow  these instructions carefully:  1.  Shower with CHG Soap the  night before surgery and the  morning of Surgery.  2.  If you choose to wash your hair, wash your hair first as usual with your  normal  shampoo.  3.  After you shampoo, rinse your hair and body thoroughly to remove the  shampoo.                           4.  Use CHG as you would any other liquid soap.  You can apply chg directly  to the skin and wash                       Gently with a scrungie or clean washcloth.  5.  Apply the CHG Soap to your body ONLY FROM THE NECK DOWN.   Do not use on face/ open                           Wound or open sores. Avoid contact with eyes, ears mouth and genitals (private parts).                       Wash face,  Genitals (private parts) with your normal soap.             6.  Wash thoroughly, paying special attention to the area where your surgery  will be performed.  7.  Thoroughly rinse your body with warm water from the neck down.  8.  DO NOT shower/wash with your normal soap after using and rinsing off  the CHG Soap.                9.  Pat yourself dry with a clean towel.            10.  Wear clean pajamas.            11.  Place clean sheets on your bed the night of your first shower and do not  sleep with pets. Day of Surgery : Do not apply any lotions/deodorants the morning of surgery.  Please wear clean clothes to the hospital/surgery center.  FAILURE TO FOLLOW THESE INSTRUCTIONS MAY RESULT IN THE CANCELLATION OF YOUR SURGERY PATIENT SIGNATURE_________________________________  NURSE SIGNATURE__________________________________  ________________________________________________________________________

## 2019-07-07 ENCOUNTER — Encounter (HOSPITAL_COMMUNITY): Payer: Self-pay

## 2019-07-07 ENCOUNTER — Encounter (HOSPITAL_COMMUNITY)
Admission: RE | Admit: 2019-07-07 | Discharge: 2019-07-07 | Disposition: A | Discharge status: Home / Self Care | Payer: Medicare Other | Source: Ambulatory Visit | Attending: Urology | Admitting: Urology

## 2019-07-07 ENCOUNTER — Other Ambulatory Visit: Payer: Self-pay

## 2019-07-07 DIAGNOSIS — I10 Essential (primary) hypertension: Secondary | ICD-10-CM | POA: Diagnosis not present

## 2019-07-07 DIAGNOSIS — Z87891 Personal history of nicotine dependence: Secondary | ICD-10-CM | POA: Diagnosis not present

## 2019-07-07 DIAGNOSIS — G4733 Obstructive sleep apnea (adult) (pediatric): Secondary | ICD-10-CM | POA: Diagnosis not present

## 2019-07-07 DIAGNOSIS — M797 Fibromyalgia: Secondary | ICD-10-CM | POA: Insufficient documentation

## 2019-07-07 DIAGNOSIS — Z01812 Encounter for preprocedural laboratory examination: Secondary | ICD-10-CM | POA: Insufficient documentation

## 2019-07-07 DIAGNOSIS — Z7901 Long term (current) use of anticoagulants: Secondary | ICD-10-CM | POA: Diagnosis not present

## 2019-07-07 DIAGNOSIS — Z79899 Other long term (current) drug therapy: Secondary | ICD-10-CM | POA: Diagnosis not present

## 2019-07-07 DIAGNOSIS — E785 Hyperlipidemia, unspecified: Secondary | ICD-10-CM | POA: Insufficient documentation

## 2019-07-07 DIAGNOSIS — N261 Atrophy of kidney (terminal): Secondary | ICD-10-CM | POA: Diagnosis not present

## 2019-07-07 HISTORY — DX: Sepsis, unspecified organism: A41.9

## 2019-07-07 LAB — BASIC METABOLIC PANEL
Anion gap: 6 (ref 5–15)
BUN: 19 mg/dL (ref 8–23)
CO2: 27 mmol/L (ref 22–32)
Calcium: 9.5 mg/dL (ref 8.9–10.3)
Chloride: 104 mmol/L (ref 98–111)
Creatinine, Ser: 1.35 mg/dL — ABNORMAL HIGH (ref 0.61–1.24)
GFR calc Af Amer: >60 mL/min (ref >60)
GFR calc non Af Amer: 52 mL/min — ABNORMAL LOW (ref >60)
Glucose, Bld: 85 mg/dL (ref 70–99)
Potassium: 4.2 mmol/L (ref 3.5–5.1)
Sodium: 137 mmol/L (ref 135–145)

## 2019-07-07 LAB — CBC
HCT: 34.7 % — ABNORMAL LOW (ref 39.0–52.0)
Hemoglobin: 11.2 g/dL — ABNORMAL LOW (ref 13.0–17.0)
MCH: 28.2 pg (ref 26.0–34.0)
MCHC: 32.3 g/dL (ref 30.0–36.0)
MCV: 87.4 fL (ref 80.0–100.0)
Platelets: 221 10*3/uL (ref 150–400)
RBC: 3.97 MIL/uL — ABNORMAL LOW (ref 4.22–5.81)
RDW: 15.4 % (ref 11.5–15.5)
WBC: 7.2 10*3/uL (ref 4.0–10.5)
nRBC: 0 % (ref 0.0–0.2)

## 2019-07-07 NOTE — Progress Notes (Signed)
PCP - Terald Sleeper, PA-C Cardiologist - Dr Shelva Majestic , lov 03/31/2019 epic   Chest x-ray - 06/25/2019 epic  EKG - 06/25/2019 epic Stress Test -  ECHO - 04/14/2019 epic Cardiac Cath -   Sleep Study - yes CPAP - no, uses 2.5L O2 nasal cann.  Fasting Blood Sugar -  Checks Blood Sugar _____ times a day  Blood Thinner Instructions: Aspirin Instructions: Last Dose:  Anesthesia review:   Hx of htn, hld, , OSA using supp O2, urosepsis 06/2019. lov with cards for eval after cardiac irregularity during hospital admission. See ECHO results (mentios dilated aorta). Patient denies any anesthesia complications. Today reports no chest pain or chest tightness. Before he got sick he was able to walk the hill near his home , distance of 1 mile , with no discomfort. Limited activity now as he is recovering.  PA to review chart.  Patient denies shortness of breath, fever, cough and chest pain at PAT appointment   Patient verbalized understanding of instructions that were given to them at the PAT appointment. Patient was also instructed that they will need to review over the PAT instructions again at home before surgery.

## 2019-07-11 ENCOUNTER — Other Ambulatory Visit (HOSPITAL_COMMUNITY)
Admission: RE | Admit: 2019-07-11 | Discharge: 2019-07-11 | Disposition: A | Discharge status: Home / Self Care | Payer: Medicare Other | Source: Ambulatory Visit | Attending: Urology | Admitting: Urology

## 2019-07-11 LAB — SARS CORONAVIRUS 2 (TAT 6-24 HRS): SARS Coronavirus 2: NEGATIVE

## 2019-07-12 ENCOUNTER — Telehealth: Payer: Self-pay | Admitting: Cardiovascular Disease

## 2019-07-12 NOTE — Telephone Encounter (Addendum)
   Primary Cardiologist: Shelva Majestic, MD  Chart reviewed as part of pre-operative protocol coverage. Patient scheduled for robotic assisted laparoscopic left nephrectomy on 07/14/2019. He was last seen by Dr. Claiborne Billings on 03/31/2019 at which time he was doing well from a cardiac standpoint. Patient contacted today in regards to pre-operative risk assessment and reported he was doing well since last visit. He notes mild shortness of breath when walking uphill but never has to stop to catch his breath. He states this is due to deconditioning from several hospitalizations for UTIs over the last few months. No chest pain, shortness of breath at rest, palpitations, syncope, or acute CHF symptoms. Able to complete >4.0 METS without any angina. Per Revised Cardiac Risk Index, considered low risk. Therefore, based on ACC/AHA guidelines, Billy Rasmussen would be at acceptable risk for the planned procedure without further cardiovascular testing.   I will route this recommendation to the requesting party via Epic fax function and remove from pre-op pool.  Please call with questions.  Darreld Mclean, PA-C 07/12/2019, 3:01 PM

## 2019-07-12 NOTE — Telephone Encounter (Signed)
Patient is overdue for follow-up. Can you please call patient to schedule follow-up in 1 month after he recovers from upcoming surgery?  Thank you!

## 2019-07-12 NOTE — Telephone Encounter (Signed)
   [facility] Pre-operative Risk Assessment    Request for surgical clearance:  1. What type of surgery is being performed? Left Kidney Removal   2. When is this surgery scheduled? 07/14/19  3. What type of clearance is required (medical clearance vs. Pharmacy clearance to hold med vs. Both)? Medical   4. Are there any medications that need to be held prior to surgery and how long? No medications need to be withheld   5. Practice name and name of physician performing surgery? Dr. Tresa Moore   6. What is your office phone number [phone]   7.   What is your office fax number [phone]  8.   Anesthesia type (None, local, MAC, general) ? General for 3 hrs   Billy Rasmussen 07/12/2019, 2:02 PM  _________________________________________________________________   (provider comments below)

## 2019-07-12 NOTE — Progress Notes (Signed)
Anesthesia Chart Review   Case: [id number] Date/Time: 07/14/19 0700   Procedure: XI ROBOTIC ASSISTED LAPAROSCOPIC NEPHRECTOMY (Left ) - 3 HRS   Anesthesia type: General   Pre-op diagnosis: ATROPHIC LEFT KIDNEY WITH RECURRENT PYELONEPHRITIS   [facility] ROOM 03 / [facility]   Surgeons: Alexis Frock, MD      DISCUSSION:74 y.o. former smoker (80 pack years, quit 06/01/97) with h/o PONV, HTN, GERD, OSA, sleep apnea (uses 2.5L O2 at night), atrophic left kidney with recurrent pyelonephritis scheduled for above procedure 07/14/19 with Dr. Alexis Frock.   Pt last seen by cardiologist, Dr. Shelva Majestic, 03/31/2019.  No anginal symptoms, blood pressure stable.  Echo ordered to assess function and valvular architecture.  6 week follow up recommended.  Echo 04/14/2019 with EF 55-60%, Ascending aortic aneurysm up to 45 mm, valves ok.  Cardiac clearance requested.   Addendum 07/13/19:  Pt cleared by cardiology.  Per Sande Rives, PA-C, "Patient scheduled for robotic assisted laparoscopic left nephrectomy on 07/14/2019. He was last seen by Dr. Claiborne Billings on 03/31/2019 at which time he was doing well from a cardiac standpoint. Patient contacted today in regards to pre-operative risk assessment and reported he was doing well since last visit. He notes mild shortness of breath when walking uphill but never has to stop to catch his breath. He states this is due to deconditioning from several hospitalizations for UTIs over the last few months. No chest pain, shortness of breath at rest, palpitations, syncope, or acute CHF symptoms. Able to complete >4.0 METS without any angina. Per Revised Cardiac Risk Index, considered low risk. Therefore, based on ACC/AHA guidelines, PHARIS KRUPPA would be at acceptable risk for the planned procedure without further cardiovascular testing."  Follow up scheduled for 1 month after surgery.   Anticipate pt can proceed with planned procedure barring acute status change.   VS: BP 139/81  (BP Location: Right Arm)   Pulse 61   Temp 36.8 C (Oral)   Resp 18   Ht 5\' 6"  (1.676 m)   Wt 98.5 kg   SpO2 99%   BMI 35.07 kg/m   PROVIDERS: Terald Sleeper, PA-C is PCP   Shelva Majestic, MD is Cardiologist  LABS: Labs reviewed: Acceptable for surgery. (all labs ordered are listed, but only abnormal results are displayed)  Labs Reviewed  BASIC METABOLIC PANEL - Abnormal; Notable for the following components:      Result Value   Creatinine, Ser 1.35 (*)    GFR calc non Af Amer 52 (*)    All other components within normal limits  CBC - Abnormal; Notable for the following components:   RBC 3.97 (*)    Hemoglobin 11.2 (*)    HCT 34.7 (*)    All other components within normal limits  TYPE AND SCREEN     IMAGES:   EKG: 06/27/19 Rate 76 bpm Sinus rhythm  Prolonged PR interval Low voltage, precordial leads Consider anterior infarct   CV: Echo 04/14/2019 IMPRESSIONS   1. Left ventricular ejection fraction, by visual estimation, is 55 to  60%. The left ventricle has normal function. There is no left ventricular  hypertrophy.  2. The left ventricle has no regional wall motion abnormalities.  3. Ascending aortic aneurysm up to 45 mm.  4. There is mild dilatation of the aortic root measuring 41 mm.  5. Global right ventricle has normal systolic function.The right  ventricular size is normal. No increase in right ventricular wall  thickness.  6. Left atrial  size was normal.  7. Right atrial size was normal.  8. Presence of pericardial fat pad.  9. Trivial pericardial effusion is present.  10. Mild aortic valve annular calcification.  11. The mitral valve is grossly normal. Trace mitral valve regurgitation.  12. The tricuspid valve is grossly normal. Tricuspid valve regurgitation  is trivial.  13. The aortic valve is tricuspid. Aortic valve regurgitation is not  visualized. No evidence of aortic valve sclerosis or stenosis.  14. The pulmonic valve was  grossly normal. Pulmonic valve regurgitation is  not visualized.  15. Aortic dilatation noted.  16. TR signal is inadequate for assessing pulmonary artery systolic  pressure.  17. The inferior vena cava is normal in size with greater than 50%  respiratory variability, suggesting right atrial pressure of 3 mmHg.  18. The average left ventricular global longitudinal strain is -19.6 %.  Past Medical History:  Diagnosis Date  . Arthritis    knees, shoulders, elbows  . Bladder cancer [facility])    urologist-  dr Junious Silk  . Diverticulosis of colon   . Fibromyalgia   . GERD (gastroesophageal reflux disease)   . History of diverticulitis of colon   . History of gastric ulcer    due to aleve  . Hypertension   . Lower urinary tract symptoms (LUTS)   . OSA (obstructive sleep apnea)    NON-COMPLIANT  CPAP  --- BUT PT USES OXYGEN AT NIGHT 2.5L VIA [place] (PT'S DECISION)  . PONV (postoperative nausea and vomiting)   . Psoriasis   . Sepsis [facility])    january 2021  . Tinnitus    right ear more, has tranmitter in right ear removable at hs  . Wears glasses   . Wears partial dentures     Past Surgical History:  Procedure Laterality Date  . APPENDECTOMY    . COLECTOMY W/ COLOSTOMY  1996   W/   APPENDECTOMY  . COLONOSCOPY N/A 05/11/2018   Procedure: COLONOSCOPY;  Surgeon: Daneil Dolin, MD;  [facility];  Service: Endoscopy;  Laterality: N/A;  9:30  . COLOSTOMY TAKEDOWN  1996  . CYSTOSCOPY WITH BIOPSY N/A 12/05/2013   Procedure: CYSTO BLADDER BIOPSY AND FULGERATION;  Surgeon: Festus Aloe, MD;  Location: [facility];  Service: Urology;  Laterality: N/A;  . CYSTOSCOPY WITH BIOPSY Bilateral 11/13/2014   Procedure: CYSTOSCOPY WITH  BLADDER BIOPSY FULGERATION AND BILATERAL RETROGRADE PYELOGRAMS;  Surgeon: Festus Aloe, MD;  Location: [facility];  Service: Urology;  Laterality: Bilateral;  . CYSTOSCOPY WITH FULGERATION N/A 01/18/2018   Procedure: CYSTOSCOPY  WITH FULGERATION/ BLADDER BIOPSY;  Surgeon: Festus Aloe, MD;  Location: [facility];  Service: Urology;  Laterality: N/A;  . CYSTOSCOPY WITH INJECTION N/A 11/09/2018   Procedure: CYSTOSCOPY WITH INJECTION OF INDOCYANINE GREEN DYE;  Surgeon: Alexis Frock, MD;  [facility];  Service: Urology;  Laterality: N/A;  . CYSTOSCOPY WITH INSERTION OF UROLIFT N/A 01/18/2018   Procedure: CYSTOSCOPY WITH INSERTION OF UROLIFT;  Surgeon: Festus Aloe, MD;  Location: [facility];  Service: Urology;  Laterality: N/A;  . CYSTOSCOPY/URETEROSCOPY/HOLMIUM LASER Left 02/17/2019   Procedure: URETEROSCOPY WITH BALLOON DILATION  AND NEPHROSTOGRAM;  Surgeon: Alexis Frock, MD;  Location: [facility];  Service: Urology;  Laterality: Left;  1 HR  . EXCISION RIGHT UPPER ARM LIPOMA  2005  . HEMORROIDECTOMY    . INGUINAL HERNIA REPAIR Left 1984  . IR NEPHROSTOMY PLACEMENT LEFT  01/05/2019  . KNEE ARTHROSCOPY Left  X3  LAST ONE  2002  . ORIF LEFT HUMEROUS FX  1976  . POLYPECTOMY  05/11/2018   Procedure: POLYPECTOMY;  Surgeon: Daneil Dolin, MD;  [facility];  Service: Endoscopy;;  . PROSTATE SURGERY    . TONSILLECTOMY  AS CHILD  . TOTAL KNEE ARTHROPLASTY Left 2006   REVISION 2007  (AFTER I & D WITH ANTIBIOTIC SPACER PROCEDURE FOR STEPH INFECTION)  . TOTAL KNEE ARTHROPLASTY Right 03/30/2016   Procedure: RIGHT TOTAL KNEE ARTHROPLASTY;  Surgeon: Paralee Cancel, MD;  [facility];  Service: Orthopedics;  Laterality: Right;  . TOTAL SHOULDER ARTHROPLASTY Right 04/06/2013   Procedure: RIGHT TOTAL SHOULDER ARTHROPLASTY;  Surgeon: Marin Shutter, MD;  Location: [facility];  Service: Orthopedics;  Laterality: Right;  . TOTAL SHOULDER ARTHROPLASTY Left 07/20/2013   Procedure: LEFT TOTAL SHOULDER ARTHROPLASTY;  Surgeon: Marin Shutter, MD;  Location: [place];  Service: Orthopedics;  Laterality: Left;  . TRANSURETHRAL RESECTION OF BLADDER TUMOR N/A 11/12/2015    Procedure: TRANSURETHRAL RESECTION OF BLADDER TUMOR (TURBT);  Surgeon: Festus Aloe, MD;  Location: [facility];  Service: Urology;  Laterality: N/A;  . TRANSURETHRAL RESECTION OF BLADDER TUMOR WITH GYRUS (TURBT-GYRUS) N/A 12/05/2013   Procedure: TRANSURETHRAL RESECTION OF BLADDER TUMOR WITH GYRUS (TURBT-GYRUS);  Surgeon: Festus Aloe, MD;  Location: [facility];  Service: Urology;  Laterality: N/A;    MEDICATIONS: . amLODipine (NORVASC) 10 MG tablet  . ampicillin (PRINCIPEN) 500 MG capsule  . atorvastatin (LIPITOR) 20 MG tablet  . ciprofloxacin (CIPRO) 500 MG tablet  . co-enzyme Q-10 30 MG capsule  . diclofenac (VOLTAREN) 75 MG EC tablet  . docusate sodium (COLACE) 100 MG capsule  . ferrous sulfate 325 (65 FE) MG tablet  . lisinopril (ZESTRIL) 5 MG tablet  . Multiple Vitamin (MULTIVITAMIN WITH MINERALS) TABS tablet  . nitrofurantoin (MACRODANTIN) 100 MG capsule  . polyvinyl alcohol (LIQUIFILM TEARS) 1.4 % ophthalmic solution  . rosuvastatin (CRESTOR) 10 MG tablet  . senna-docusate (SENOKOT-S) 8.6-50 MG tablet   No current facility-administered medications for this encounter.     Maia Plan [facility] Pre-Surgical Testing [phone] 07/13/19  9:22 AM

## 2019-07-13 NOTE — Anesthesia Preprocedure Evaluation (Addendum)
Anesthesia Evaluation  Patient identified by MRN, date of birth, ID band Patient awake    Reviewed: Allergy & Precautions, NPO status , Patient's Chart, lab work & pertinent test results  History of Anesthesia Complications (+) PONV  Airway Mallampati: II  TM Distance: >3 FB Neck ROM: Full    Dental  (+) Dental Advisory Given   Pulmonary sleep apnea and Oxygen sleep apnea , former smoker,    breath sounds clear to auscultation       Cardiovascular hypertension, Pt. on medications  Rhythm:Regular Rate:Normal  Normal EF. Thoracic ascending aneurysm 4.5cm   Neuro/Psych negative neurological ROS     GI/Hepatic Neg liver ROS, GERD  ,  Endo/Other  negative endocrine ROS  Renal/GU CRFRenal disease     Musculoskeletal  (+) Arthritis , Fibromyalgia -  Abdominal   Peds  Hematology  (+) anemia ,   Anesthesia Other Findings   Reproductive/Obstetrics                           Lab Results  Component Value Date   WBC 7.2 07/07/2019   HGB 11.2 (L) 07/07/2019   HCT 34.7 (L) 07/07/2019   MCV 87.4 07/07/2019   PLT 221 07/07/2019   Lab Results  Component Value Date   CREATININE 1.35 (H) 07/07/2019   BUN 19 07/07/2019   NA 137 07/07/2019   K 4.2 07/07/2019   CL 104 07/07/2019   CO2 27 07/07/2019    Anesthesia Physical Anesthesia Plan  ASA: II  Anesthesia Plan: General   Post-op Pain Management:    Induction: Intravenous  PONV Risk Score and Plan: 3 and Dexamethasone, Ondansetron and Treatment may vary due to age or medical condition  Airway Management Planned: Oral ETT  Additional Equipment:   Intra-op Plan:   Post-operative Plan: Extubation in OR  Informed Consent: I have reviewed the patients History and Physical, chart, labs and discussed the procedure including the risks, benefits and alternatives for the proposed anesthesia with the patient or authorized representative who has  indicated his/her understanding and acceptance.     Dental advisory given  Plan Discussed with: CRNA  Anesthesia Plan Comments:       Anesthesia Quick Evaluation

## 2019-07-14 ENCOUNTER — Inpatient Hospital Stay (HOSPITAL_COMMUNITY): Payer: Medicare Other | Admitting: Anesthesiology

## 2019-07-14 ENCOUNTER — Inpatient Hospital Stay (HOSPITAL_COMMUNITY)
Admission: RE | Admit: 2019-07-14 | Discharge: 2019-07-15 | DRG: 661 | Disposition: A | Discharge status: Home / Self Care | Payer: Medicare Other | Attending: Urology | Admitting: Urology

## 2019-07-14 ENCOUNTER — Encounter (HOSPITAL_COMMUNITY): Payer: Self-pay | Admitting: Urology

## 2019-07-14 ENCOUNTER — Inpatient Hospital Stay (HOSPITAL_COMMUNITY): Payer: Medicare Other | Admitting: Physician Assistant

## 2019-07-14 ENCOUNTER — Other Ambulatory Visit: Payer: Self-pay

## 2019-07-14 ENCOUNTER — Encounter (HOSPITAL_COMMUNITY)
Admission: RE | Disposition: A | Discharge status: Home / Self Care | Payer: Self-pay | Source: Home / Self Care | Attending: Urology

## 2019-07-14 DIAGNOSIS — C679 Malignant neoplasm of bladder, unspecified: Secondary | ICD-10-CM | POA: Diagnosis present

## 2019-07-14 DIAGNOSIS — N261 Atrophy of kidney (terminal): Principal | ICD-10-CM | POA: Diagnosis present

## 2019-07-14 DIAGNOSIS — N119 Chronic tubulo-interstitial nephritis, unspecified: Secondary | ICD-10-CM | POA: Diagnosis not present

## 2019-07-14 DIAGNOSIS — N136 Pyonephrosis: Secondary | ICD-10-CM | POA: Diagnosis present

## 2019-07-14 DIAGNOSIS — K219 Gastro-esophageal reflux disease without esophagitis: Secondary | ICD-10-CM | POA: Diagnosis present

## 2019-07-14 DIAGNOSIS — Z20822 Contact with and (suspected) exposure to covid-19: Secondary | ICD-10-CM | POA: Diagnosis present

## 2019-07-14 DIAGNOSIS — N269 Renal sclerosis, unspecified: Secondary | ICD-10-CM | POA: Diagnosis not present

## 2019-07-14 DIAGNOSIS — N289 Disorder of kidney and ureter, unspecified: Secondary | ICD-10-CM | POA: Diagnosis present

## 2019-07-14 DIAGNOSIS — E7800 Pure hypercholesterolemia, unspecified: Secondary | ICD-10-CM | POA: Diagnosis not present

## 2019-07-14 DIAGNOSIS — Z96653 Presence of artificial knee joint, bilateral: Secondary | ICD-10-CM | POA: Diagnosis not present

## 2019-07-14 DIAGNOSIS — Z9119 Patient's noncompliance with other medical treatment and regimen: Secondary | ICD-10-CM | POA: Diagnosis not present

## 2019-07-14 DIAGNOSIS — N2889 Other specified disorders of kidney and ureter: Secondary | ICD-10-CM | POA: Diagnosis present

## 2019-07-14 DIAGNOSIS — I10 Essential (primary) hypertension: Secondary | ICD-10-CM | POA: Diagnosis present

## 2019-07-14 DIAGNOSIS — M797 Fibromyalgia: Secondary | ICD-10-CM | POA: Diagnosis present

## 2019-07-14 DIAGNOSIS — Z87891 Personal history of nicotine dependence: Secondary | ICD-10-CM | POA: Diagnosis not present

## 2019-07-14 DIAGNOSIS — N10 Acute tubulo-interstitial nephritis: Secondary | ICD-10-CM | POA: Diagnosis not present

## 2019-07-14 HISTORY — PX: ROBOT ASSISTED LAPAROSCOPIC NEPHRECTOMY: SHX5140

## 2019-07-14 LAB — TYPE AND SCREEN
ABO/RH(D): O NEG
Antibody Screen: NEGATIVE

## 2019-07-14 LAB — HEMOGLOBIN AND HEMATOCRIT, BLOOD
HCT: 36.8 % — ABNORMAL LOW (ref 39.0–52.0)
Hemoglobin: 11.7 g/dL — ABNORMAL LOW (ref 13.0–17.0)

## 2019-07-14 LAB — GLUCOSE, CAPILLARY: Glucose-Capillary: 104 mg/dL — ABNORMAL HIGH (ref 70–99)

## 2019-07-14 SURGERY — NEPHRECTOMY, RADICAL, ROBOT-ASSISTED, LAPAROSCOPIC, ADULT
Anesthesia: General | Site: Flank | Laterality: Left

## 2019-07-14 MED ORDER — LIDOCAINE 2% (20 MG/ML) 5 ML SYRINGE
INTRAMUSCULAR | Status: AC
  Filled 2019-07-14: qty 5

## 2019-07-14 MED ORDER — ONDANSETRON HCL 4 MG/2ML IJ SOLN: 4.0000 mg | Freq: Once | INTRAMUSCULAR | Status: DC | PRN

## 2019-07-14 MED ORDER — KETAMINE HCL 10 MG/ML IJ SOLN
INTRAMUSCULAR | Status: AC
  Filled 2019-07-14: qty 1

## 2019-07-14 MED ORDER — DOCUSATE SODIUM 100 MG PO CAPS
200.0000 mg | ORAL_CAPSULE | Freq: Every day | ORAL | Status: DC
  Administered 2019-07-14 – 2019-07-15 (×2): 200 mg via ORAL
  Filled 2019-07-14 (×2): qty 2

## 2019-07-14 MED ORDER — HYDROMORPHONE HCL 2 MG/ML IJ SOLN
INTRAMUSCULAR | Status: AC
  Filled 2019-07-14: qty 1

## 2019-07-14 MED ORDER — ROCURONIUM BROMIDE 10 MG/ML (PF) SYRINGE
PREFILLED_SYRINGE | INTRAVENOUS | Status: AC
  Filled 2019-07-14: qty 10

## 2019-07-14 MED ORDER — ONDANSETRON HCL 4 MG/2ML IJ SOLN
INTRAMUSCULAR | Status: AC
  Filled 2019-07-14: qty 2

## 2019-07-14 MED ORDER — HYDROMORPHONE HCL 1 MG/ML IJ SOLN
0.5000 mg | INTRAMUSCULAR | Status: DC | PRN
  Administered 2019-07-14 – 2019-07-15 (×4): 1 mg via INTRAVENOUS
  Filled 2019-07-14 (×4): qty 1

## 2019-07-14 MED ORDER — AMPICILLIN 500 MG PO CAPS
500.0000 mg | ORAL_CAPSULE | Freq: Three times a day (TID) | ORAL | Status: DC
  Administered 2019-07-14 – 2019-07-15 (×3): 500 mg via ORAL
  Filled 2019-07-14 (×5): qty 1

## 2019-07-14 MED ORDER — LIDOCAINE 2% (20 MG/ML) 5 ML SYRINGE
INTRAMUSCULAR | Status: DC | PRN
  Administered 2019-07-14: 1.5 mg/kg/h via INTRAVENOUS

## 2019-07-14 MED ORDER — LABETALOL HCL 5 MG/ML IV SOLN
INTRAVENOUS | Status: AC
  Filled 2019-07-14: qty 4

## 2019-07-14 MED ORDER — STERILE WATER FOR IRRIGATION IR SOLN
Status: DC | PRN
  Administered 2019-07-14: 1000 mL

## 2019-07-14 MED ORDER — ONDANSETRON HCL 4 MG/2ML IJ SOLN
INTRAMUSCULAR | Status: DC | PRN
  Administered 2019-07-14: 4 mg via INTRAVENOUS

## 2019-07-14 MED ORDER — LACTATED RINGERS IV SOLN: INTRAVENOUS | Status: DC

## 2019-07-14 MED ORDER — ROSUVASTATIN CALCIUM 10 MG PO TABS
10.0000 mg | ORAL_TABLET | Freq: Every day | ORAL | Status: DC
  Administered 2019-07-14: 17:00:00 10 mg via ORAL
  Filled 2019-07-14: qty 1

## 2019-07-14 MED ORDER — POLYVINYL ALCOHOL 1.4 % OP SOLN
1.0000 [drp] | OPHTHALMIC | Status: DC | PRN
  Filled 2019-07-14: qty 15

## 2019-07-14 MED ORDER — SODIUM CHLORIDE (PF) 0.9 % IJ SOLN
INTRAMUSCULAR | Status: AC
  Filled 2019-07-14: qty 20

## 2019-07-14 MED ORDER — FENTANYL CITRATE (PF) 100 MCG/2ML IJ SOLN
INTRAMUSCULAR | Status: AC
  Filled 2019-07-14: qty 2

## 2019-07-14 MED ORDER — ACETAMINOPHEN 10 MG/ML IV SOLN
1000.0000 mg | Freq: Four times a day (QID) | INTRAVENOUS | Status: AC
  Administered 2019-07-14 – 2019-07-15 (×4): 1000 mg via INTRAVENOUS
  Filled 2019-07-14 (×4): qty 100

## 2019-07-14 MED ORDER — FENTANYL CITRATE (PF) 100 MCG/2ML IJ SOLN
25.0000 ug | INTRAMUSCULAR | Status: DC | PRN
  Administered 2019-07-14: 11:00:00 25 ug via INTRAVENOUS
  Administered 2019-07-14: 11:00:00 50 ug via INTRAVENOUS
  Administered 2019-07-14: 12:00:00 25 ug via INTRAVENOUS
  Administered 2019-07-14: 11:00:00 50 ug via INTRAVENOUS

## 2019-07-14 MED ORDER — HYDROCODONE-ACETAMINOPHEN 5-325 MG PO TABS: 1.0000 | ORAL_TABLET | Freq: Four times a day (QID) | ORAL | 0 refills | Status: DC | PRN

## 2019-07-14 MED ORDER — MIDAZOLAM HCL 2 MG/2ML IJ SOLN
INTRAMUSCULAR | Status: DC | PRN
  Administered 2019-07-14: 2 mg via INTRAVENOUS

## 2019-07-14 MED ORDER — SUGAMMADEX SODIUM 200 MG/2ML IV SOLN
INTRAVENOUS | Status: DC | PRN
  Administered 2019-07-14: 400 mg via INTRAVENOUS

## 2019-07-14 MED ORDER — DEXTROSE-NACL 5-0.45 % IV SOLN: INTRAVENOUS | Status: DC

## 2019-07-14 MED ORDER — SODIUM CHLORIDE (PF) 0.9 % IJ SOLN
INTRAMUSCULAR | Status: DC | PRN
  Administered 2019-07-14: 20 mL

## 2019-07-14 MED ORDER — ATORVASTATIN CALCIUM 20 MG PO TABS: 20.0000 mg | ORAL_TABLET | Freq: Every day | ORAL | Status: DC

## 2019-07-14 MED ORDER — LIDOCAINE 2% (20 MG/ML) 5 ML SYRINGE
INTRAMUSCULAR | Status: DC | PRN
  Administered 2019-07-14: 50 mg via INTRAVENOUS

## 2019-07-14 MED ORDER — CIPROFLOXACIN IN D5W 400 MG/200ML IV SOLN
400.0000 mg | INTRAVENOUS | Status: AC
  Administered 2019-07-14: 08:00:00 400 mg via INTRAVENOUS
  Filled 2019-07-14: qty 200

## 2019-07-14 MED ORDER — MIDAZOLAM HCL 2 MG/2ML IJ SOLN
INTRAMUSCULAR | Status: AC
  Filled 2019-07-14: qty 2

## 2019-07-14 MED ORDER — DEXAMETHASONE SODIUM PHOSPHATE 10 MG/ML IJ SOLN
INTRAMUSCULAR | Status: DC | PRN
  Administered 2019-07-14: 10 mg via INTRAVENOUS

## 2019-07-14 MED ORDER — ONDANSETRON HCL 4 MG/2ML IJ SOLN
4.0000 mg | INTRAMUSCULAR | Status: DC | PRN
  Administered 2019-07-14 (×2): 4 mg via INTRAVENOUS
  Filled 2019-07-14 (×2): qty 2

## 2019-07-14 MED ORDER — LACTATED RINGERS IV SOLN: INTRAVENOUS | Status: DC | PRN

## 2019-07-14 MED ORDER — PROPOFOL 10 MG/ML IV BOLUS
INTRAVENOUS | Status: AC
  Filled 2019-07-14: qty 20

## 2019-07-14 MED ORDER — ROCURONIUM BROMIDE 10 MG/ML (PF) SYRINGE
PREFILLED_SYRINGE | INTRAVENOUS | Status: DC | PRN
  Administered 2019-07-14 (×3): 20 mg via INTRAVENOUS
  Administered 2019-07-14: 60 mg via INTRAVENOUS

## 2019-07-14 MED ORDER — BUPIVACAINE LIPOSOME 1.3 % IJ SUSP
20.0000 mL | Freq: Once | INTRAMUSCULAR | Status: AC
  Administered 2019-07-14: 10:00:00 20 mL
  Filled 2019-07-14: qty 20

## 2019-07-14 MED ORDER — OXYCODONE HCL 5 MG PO TABS
5.0000 mg | ORAL_TABLET | ORAL | Status: DC | PRN
  Administered 2019-07-14 – 2019-07-15 (×2): 5 mg via ORAL
  Filled 2019-07-14 (×2): qty 1

## 2019-07-14 MED ORDER — LACTATED RINGERS IR SOLN
Status: DC | PRN
  Administered 2019-07-14: 1000 mL

## 2019-07-14 MED ORDER — PROPOFOL 10 MG/ML IV BOLUS
INTRAVENOUS | Status: DC | PRN
  Administered 2019-07-14: 150 mg via INTRAVENOUS

## 2019-07-14 MED ORDER — MAGNESIUM CITRATE PO SOLN: 1.0000 | Freq: Once | ORAL | Status: DC

## 2019-07-14 MED ORDER — DIPHENHYDRAMINE HCL 50 MG/ML IJ SOLN: 12.5000 mg | Freq: Four times a day (QID) | INTRAMUSCULAR | Status: DC | PRN

## 2019-07-14 MED ORDER — SODIUM CHLORIDE 0.9 % IV SOLN
1.5000 g | INTRAVENOUS | Status: AC
  Administered 2019-07-14: 08:00:00 1.5 g via INTRAVENOUS
  Filled 2019-07-14: qty 1.5

## 2019-07-14 MED ORDER — HYDROMORPHONE HCL 1 MG/ML IJ SOLN
INTRAMUSCULAR | Status: DC | PRN
  Administered 2019-07-14: .6 mg via INTRAVENOUS

## 2019-07-14 MED ORDER — KETAMINE HCL 10 MG/ML IJ SOLN
INTRAMUSCULAR | Status: DC | PRN
  Administered 2019-07-14 (×2): 15 mg via INTRAVENOUS

## 2019-07-14 MED ORDER — LIDOCAINE HCL 2 % IJ SOLN
INTRAMUSCULAR | Status: AC
  Filled 2019-07-14: qty 20

## 2019-07-14 MED ORDER — FENTANYL CITRATE (PF) 100 MCG/2ML IJ SOLN
INTRAMUSCULAR | Status: DC | PRN
  Administered 2019-07-14 (×4): 50 ug via INTRAVENOUS

## 2019-07-14 MED ORDER — LABETALOL HCL 5 MG/ML IV SOLN
INTRAVENOUS | Status: DC | PRN
  Administered 2019-07-14 (×2): 5 mg via INTRAVENOUS

## 2019-07-14 MED ORDER — FENTANYL CITRATE (PF) 100 MCG/2ML IJ SOLN
INTRAMUSCULAR | Status: AC
  Filled 2019-07-14: qty 4

## 2019-07-14 MED ORDER — DEXAMETHASONE SODIUM PHOSPHATE 10 MG/ML IJ SOLN
INTRAMUSCULAR | Status: AC
  Filled 2019-07-14: qty 1

## 2019-07-14 MED ORDER — AMLODIPINE BESYLATE 10 MG PO TABS
10.0000 mg | ORAL_TABLET | Freq: Every day | ORAL | Status: DC
  Administered 2019-07-14 – 2019-07-15 (×2): 10 mg via ORAL
  Filled 2019-07-14 (×2): qty 1

## 2019-07-14 MED ORDER — DIPHENHYDRAMINE HCL 12.5 MG/5ML PO ELIX: 12.5000 mg | ORAL_SOLUTION | Freq: Four times a day (QID) | ORAL | Status: DC | PRN

## 2019-07-14 SURGICAL SUPPLY — 63 items
BAG LAPAROSCOPIC 12 15 PORT 16 (BASKET) ×1 IMPLANT
BAG RETRIEVAL 12/15 (BASKET) ×2
BAG RETRIEVAL 12/15MM (BASKET) ×1
CHLORAPREP W/TINT 26 (MISCELLANEOUS) ×3 IMPLANT
CLIP VESOLOCK LG 6/CT PURPLE (CLIP) ×3 IMPLANT
CLIP VESOLOCK MED LG 6/CT (CLIP) ×3 IMPLANT
CLIP VESOLOCK XL 6/CT (CLIP) ×3 IMPLANT
COVER SURGICAL LIGHT HANDLE (MISCELLANEOUS) ×3 IMPLANT
COVER TIP SHEARS 8 DVNC (MISCELLANEOUS) ×1 IMPLANT
COVER TIP SHEARS 8MM DA VINCI (MISCELLANEOUS) ×2
COVER WAND RF STERILE (DRAPES) ×3 IMPLANT
CUTTER ECHEON FLEX ENDO 45 340 (ENDOMECHANICALS) ×3 IMPLANT
DECANTER SPIKE VIAL GLASS SM (MISCELLANEOUS) ×3 IMPLANT
DERMABOND ADVANCED (GAUZE/BANDAGES/DRESSINGS) ×4
DERMABOND ADVANCED .7 DNX12 (GAUZE/BANDAGES/DRESSINGS) ×2 IMPLANT
DISSECT BALLN SPACEMKR + OVL (BALLOONS) ×3
DISSECTOR BALLN SPACEMKR + OVL (BALLOONS) ×1 IMPLANT
DRAIN CHANNEL 15F RND FF 3/16 (WOUND CARE) IMPLANT
DRAPE ARM DVNC X/XI (DISPOSABLE) ×4 IMPLANT
DRAPE COLUMN DVNC XI (DISPOSABLE) ×1 IMPLANT
DRAPE DA VINCI XI ARM (DISPOSABLE) ×8
DRAPE DA VINCI XI COLUMN (DISPOSABLE) ×2
DRAPE INCISE IOBAN 66X45 STRL (DRAPES) ×3 IMPLANT
DRAPE LAPAROSCOPIC ABDOMINAL (DRAPES) IMPLANT
DRAPE SHEET LG 3/4 BI-LAMINATE (DRAPES) ×3 IMPLANT
ELECT PENCIL ROCKER SW 15FT (MISCELLANEOUS) ×3 IMPLANT
ELECT REM PT RETURN 15FT ADLT (MISCELLANEOUS) ×3 IMPLANT
EVACUATOR SILICONE 100CC (DRAIN) IMPLANT
GLOVE BIO SURGEON STRL SZ 6.5 (GLOVE) ×2 IMPLANT
GLOVE BIO SURGEONS STRL SZ 6.5 (GLOVE) ×1
GLOVE BIOGEL M STRL SZ7.5 (GLOVE) ×6 IMPLANT
GOWN STRL REUS W/TWL LRG LVL3 (GOWN DISPOSABLE) ×9 IMPLANT
IRRIG SUCT STRYKERFLOW 2 WTIP (MISCELLANEOUS) ×3
IRRIGATION SUCT STRKRFLW 2 WTP (MISCELLANEOUS) ×1 IMPLANT
KIT BASIN OR (CUSTOM PROCEDURE TRAY) ×3 IMPLANT
KIT TURNOVER KIT A (KITS) IMPLANT
LOOP VESSEL MAXI BLUE (MISCELLANEOUS) ×3 IMPLANT
NEEDLE INSUFFLATION 14GA 120MM (NEEDLE) ×3 IMPLANT
PENCIL SMOKE EVACUATOR (MISCELLANEOUS) IMPLANT
PORT ACCESS TROCAR AIRSEAL 12 (TROCAR) ×1 IMPLANT
PORT ACCESS TROCAR AIRSEAL 5M (TROCAR) ×2
PROTECTOR NERVE ULNAR (MISCELLANEOUS) ×6 IMPLANT
SEAL CANN UNIV 5-8 DVNC XI (MISCELLANEOUS) ×4 IMPLANT
SEAL XI 5MM-8MM UNIVERSAL (MISCELLANEOUS) ×8
SET TRI-LUMEN FLTR TB AIRSEAL (TUBING) ×3 IMPLANT
SET TUBE SMOKE EVAC HIGH FLOW (TUBING) ×3 IMPLANT
SOLUTION ELECTROLUBE (MISCELLANEOUS) ×3 IMPLANT
SPONGE LAP 4X18 RFD (DISPOSABLE) ×3 IMPLANT
STAPLE RELOAD 45 WHT (STAPLE) ×6 IMPLANT
STAPLE RELOAD 45MM WHITE (STAPLE) ×12
SUT ETHILON 3 0 PS 1 (SUTURE) IMPLANT
SUT MNCRL AB 4-0 PS2 18 (SUTURE) ×6 IMPLANT
SUT NOVA 0 T19/GS 22DT (SUTURE) ×6 IMPLANT
SUT PDS AB 1 CT1 27 (SUTURE) ×9 IMPLANT
SUT VICRYL 0 UR6 27IN ABS (SUTURE) IMPLANT
TOWEL OR 17X26 10 PK STRL BLUE (TOWEL DISPOSABLE) ×6 IMPLANT
TOWEL OR NON WOVEN STRL DISP B (DISPOSABLE) ×3 IMPLANT
TRAY FOLEY MTR SLVR 16FR STAT (SET/KITS/TRAYS/PACK) IMPLANT
TRAY LAPAROSCOPIC (CUSTOM PROCEDURE TRAY) ×3 IMPLANT
TROCAR BLADELESS OPT 5 100 (ENDOMECHANICALS) IMPLANT
TROCAR UNIVERSAL OPT 12M 100M (ENDOMECHANICALS) ×3 IMPLANT
TROCAR XCEL 12X100 BLDLESS (ENDOMECHANICALS) ×3 IMPLANT
WATER STERILE IRR 1000ML POUR (IV SOLUTION) ×3 IMPLANT

## 2019-07-14 NOTE — Discharge Instructions (Signed)

## 2019-07-14 NOTE — Transfer of Care (Signed)
Immediate Anesthesia Transfer of Care Note  Patient: Billy Rasmussen  Procedure(s) Performed: XI ROBOTIC ASSISTED LAPAROSCOPIC RETROPERITONEAL NEPHRECTOMY (Left Flank)  Patient Location: PACU  Anesthesia Type:General  Level of Consciousness: awake, alert  and oriented  Airway & Oxygen Therapy: Patient Spontanous Breathing and Patient connected to face mask oxygen  Post-op Assessment: Report given to RN, Post -op Vital signs reviewed and stable and Patient moving all extremities X 4  Post vital signs: Reviewed and stable  Last Vitals:  Vitals Value Taken Time  BP 156/81 07/14/19 1036  Temp    Pulse 62 07/14/19 1038  Resp 14 07/14/19 1038  SpO2 100 % 07/14/19 1038  Vitals shown include unvalidated device data.  Last Pain:  Vitals:   07/14/19 0617  TempSrc:   PainSc: 0-No pain         Complications: No apparent anesthesia complications

## 2019-07-14 NOTE — Anesthesia Procedure Notes (Signed)
Procedure Name: Intubation Date/Time: 07/14/2019 7:41 AM Performed by: Niel Hummer, CRNA Pre-anesthesia Checklist: Patient identified, Emergency Drugs available, Suction available and Patient being monitored Patient Re-evaluated:Patient Re-evaluated prior to induction Oxygen Delivery Method: Circle system utilized Preoxygenation: Pre-oxygenation with 100% oxygen Induction Type: IV induction Ventilation: Mask ventilation without difficulty and Oral airway inserted - appropriate to patient size Laryngoscope Size: Mac and 4 Grade View: Grade I Tube type: Oral Tube size: 7.5 mm Number of attempts: 1 Airway Equipment and Method: Stylet Placement Confirmation: ETT inserted through vocal cords under direct vision,  positive ETCO2 and breath sounds checked- equal and bilateral Secured at: 23 cm Tube secured with: Tape Dental Injury: Teeth and Oropharynx as per pre-operative assessment

## 2019-07-14 NOTE — Brief Op Note (Signed)
07/14/2019  10:17 AM  PATIENT:  Billy Rasmussen  74 y.o. male  PRE-OPERATIVE DIAGNOSIS:  ATROPHIC LEFT KIDNEY WITH RECURRENT PYELONEPHRITIS  POST-OPERATIVE DIAGNOSIS:  ATROPHIC LEFT KIDNEY WITH RECURRENT PYELONEPHRITIS  PROCEDURE:  Procedure(s) with comments: XI ROBOTIC ASSISTED LAPAROSCOPIC RETROPERITONEAL NEPHRECTOMY (Left) - 3 HRS  SURGEON:  Surgeon(s) and Role:    Alexis Frock, MD - Primary  PHYSICIAN ASSISTANT:   ASSISTANTS: Debbrah Alar PA   ANESTHESIA:   local and spinal  EBL:  100 mL   BLOOD ADMINISTERED:none  DRAINS: RLQ Urostomy to gravity   LOCAL MEDICATIONS USED:  MARCAINE     SPECIMEN:  Source of Specimen:  Lt Kidney  DISPOSITION OF SPECIMEN:  PATHOLOGY  COUNTS:  YES  TOURNIQUET:  * No tourniquets in log *  DICTATION: .Other Dictation: Dictation Number [id number]  PLAN OF CARE: Admit to inpatient   PATIENT DISPOSITION:  PACU - hemodynamically stable.   Delay start of Pharmacological VTE agent (>24hrs) due to surgical blood loss or risk of bleeding: yes

## 2019-07-14 NOTE — Anesthesia Postprocedure Evaluation (Signed)
Anesthesia Post Note  Patient: Billy Rasmussen  Procedure(s) Performed: XI ROBOTIC ASSISTED LAPAROSCOPIC RETROPERITONEAL NEPHRECTOMY (Left Flank)     Patient location during evaluation: PACU Anesthesia Type: General Level of consciousness: awake and alert Pain management: pain level controlled Vital Signs Assessment: post-procedure vital signs reviewed and stable Respiratory status: spontaneous breathing, nonlabored ventilation, respiratory function stable and patient connected to nasal cannula oxygen Cardiovascular status: blood pressure returned to baseline and stable Postop Assessment: no apparent nausea or vomiting Anesthetic complications: no    Last Vitals:  Vitals:   07/14/19 1200 07/14/19 1229  BP: (!) 141/79 140/84  Pulse: 65 74  Resp: 12 17  Temp:  36.5 C  SpO2: 97% 98%    Last Pain:  Vitals:   07/14/19 1300  TempSrc:   PainSc: 9                  Tiajuana Amass

## 2019-07-14 NOTE — H&P (Signed)
Billy Rasmussen is an 74 y.o. male.    Chief Complaint: Pre-Op LEFT Nephrectomy  HPI:   1 - Recurrent High Grade Bladder Cancer - s/p robotic cystoprostatectomy with ICG sentinal + template lymphadenctomy, extensive adhesions, and conduit diversion 10/2018 for [id number] cancer with NEGATIVE margins. BCG refractory disease prior. Urethra remains in site.   Post-Cystectomy Surveillance:  - 03/2019 CT no recurrence.   2 -Recurrent Pyelonephritis - treated 11/2018 and again 12/2018, 01/2019, and now 03/2019 for suspect pyelo on eval fevers and malaise. UCX 12/2018 pseudomonas pan-sensitive (cipro, gent). [name] 03/16/19 pending, placed on empiric Cefipime. UCX 05/2019 Klebsiella, Psuedomonas, Eterococcus sens Cipro + Amp or Nitro.   3 - Left Hydronephrosis Distal Partial Ureteral Stricture / Atrophic Left Kidney - mild left hydro on ER CT 12/2018 after cystoprostatectomy. Pyelo at time and therefore left neph tube placed. Antegrade ureteroscopy confirmed left distal partial stricture (long segemnt, appears to be from retrocolic tunnel to conduit, not anastamotic) and balloon dilated / antegrade stent placed and then later removed 03/2019. Recurrent hydro by [place] 05/2019 suggesting stricture recurrence. Renogram 06/2019 with minimal left renal function 11%. 1 artery (lower acessory) 1 vein (dual gonadals) left renovascular anatomy and open retroperitoneal window by CT 05/2019.   PMH sig for obesity, partial colectomy / colostomy / colostomy take down for diverticulitis mid 1990s (pre-cystectomy) , bilateral total knee, bilateral shoulder surgery, lipoma removal x several. His PCP is Particia Nearing PA.   Today " Billy Rasmussen " is seen to proceed with LEFT nephrectomy for chronic stricture in setting of recurrent infetions and atrophic kidney. No interval fevers. He has been on macrobid pre-op to reduce colonization.   Past Medical History:  Diagnosis Date  . Arthritis    knees, shoulders, elbows  . Bladder cancer  [facility])    urologist-  dr Junious Silk  . Diverticulosis of colon   . Fibromyalgia   . GERD (gastroesophageal reflux disease)   . History of diverticulitis of colon   . History of gastric ulcer    due to aleve  . Hypertension   . Lower urinary tract symptoms (LUTS)   . OSA (obstructive sleep apnea)    NON-COMPLIANT  CPAP  --- BUT PT USES OXYGEN AT NIGHT 2.5L VIA [place] (PT'S DECISION)  . PONV (postoperative nausea and vomiting)   . Psoriasis   . Sepsis [facility])    january 2021  . Tinnitus    right ear more, has tranmitter in right ear removable at hs  . Wears glasses   . Wears partial dentures     Past Surgical History:  Procedure Laterality Date  . APPENDECTOMY    . COLECTOMY W/ COLOSTOMY  1996   W/   APPENDECTOMY  . COLONOSCOPY N/A 05/11/2018   Procedure: COLONOSCOPY;  Surgeon: Daneil Dolin, MD;  [facility];  Service: Endoscopy;  Laterality: N/A;  9:30  . COLOSTOMY TAKEDOWN  1996  . CYSTOSCOPY WITH BIOPSY N/A 12/05/2013   Procedure: CYSTO BLADDER BIOPSY AND FULGERATION;  Surgeon: Festus Aloe, MD;  Location[facility];  Service: Urology;  Laterality: N/A;  . CYSTOSCOPY WITH BIOPSY Bilateral 11/13/2014   Procedure: CYSTOSCOPY WITH  BLADDER BIOPSY FULGERATION AND BILATERAL RETROGRADE PYELOGRAMS;  Surgeon: Festus Aloe, MD;  Location: [facility];  Service: Urology;  Laterality: Bilateral;  . CYSTOSCOPY WITH FULGERATION N/A 01/18/2018   Procedure: CYSTOSCOPY WITH FULGERATION/ BLADDER BIOPSY;  Surgeon: Festus Aloe, MD;  Location: [facility];  Service: Urology;  Laterality:  N/A;  . CYSTOSCOPY WITH INJECTION N/A 11/09/2018   Procedure: CYSTOSCOPY WITH INJECTION OF INDOCYANINE GREEN DYE;  Surgeon: Alexis Frock, MD;  Location: [facility];  Service: Urology;  Laterality: N/A;  . CYSTOSCOPY WITH INSERTION OF UROLIFT N/A 01/18/2018   Procedure: CYSTOSCOPY WITH INSERTION OF UROLIFT;  Surgeon: Festus Aloe, MD;  Location:  [facility];  Service: Urology;  Laterality: N/A;  . CYSTOSCOPY/URETEROSCOPY/HOLMIUM LASER Left 02/17/2019   Procedure: URETEROSCOPY WITH BALLOON DILATION  AND NEPHROSTOGRAM;  Surgeon: Alexis Frock, MD;  Location: [facility];  Service: Urology;  Laterality: Left;  1 HR  . EXCISION RIGHT UPPER ARM LIPOMA  2005  . HEMORROIDECTOMY    . INGUINAL HERNIA REPAIR Left 1984  . IR NEPHROSTOMY PLACEMENT LEFT  01/05/2019  . KNEE ARTHROSCOPY Left X3  LAST ONE  2002  . ORIF LEFT HUMEROUS FX  1976  . POLYPECTOMY  05/11/2018   Procedure: POLYPECTOMY;  Surgeon: Daneil Dolin, MD;  [facility];  Service: Endoscopy;;  . PROSTATE SURGERY    . TONSILLECTOMY  AS CHILD  . TOTAL KNEE ARTHROPLASTY Left 2006   REVISION 2007  (AFTER I & D WITH ANTIBIOTIC SPACER PROCEDURE FOR STEPH INFECTION)  . TOTAL KNEE ARTHROPLASTY Right 03/30/2016   Procedure: RIGHT TOTAL KNEE ARTHROPLASTY;  Surgeon: Paralee Cancel, MD;  Location: [facility];  Service: Orthopedics;  Laterality: Right;  . TOTAL SHOULDER ARTHROPLASTY Right 04/06/2013   Procedure: RIGHT TOTAL SHOULDER ARTHROPLASTY;  Surgeon: Marin Shutter, MD;  Location: [place];  Service: Orthopedics;  Laterality: Right;  . TOTAL SHOULDER ARTHROPLASTY Left 07/20/2013   Procedure: LEFT TOTAL SHOULDER ARTHROPLASTY;  Surgeon: Marin Shutter, MD;  Location: [place];  Service: Orthopedics;  Laterality: Left;  . TRANSURETHRAL RESECTION OF BLADDER TUMOR N/A 11/12/2015   Procedure: TRANSURETHRAL RESECTION OF BLADDER TUMOR (TURBT);  Surgeon: Festus Aloe, MD;  Location: [facility];  Service: Urology;  Laterality: N/A;  . TRANSURETHRAL RESECTION OF BLADDER TUMOR WITH GYRUS (TURBT-GYRUS) N/A 12/05/2013   Procedure: TRANSURETHRAL RESECTION OF BLADDER TUMOR WITH GYRUS (TURBT-GYRUS);  Surgeon: Festus Aloe, MD;  Location: [facility];  Service: Urology;  Laterality: N/A;    Family History  Problem Relation Age of Onset  .  Alzheimer's disease Mother   . Heart attack Father   . Heart disease Father   . Irregular heart beat Brother        DEFIB.  . Congestive Heart Failure Brother   . Diabetes Daughter    Social History:  reports that he quit smoking about 22 years ago. His smoking use included cigarettes. He has a 80.00 pack-year smoking history. He has never used smokeless tobacco. He reports that he does not drink alcohol or use drugs.  Allergies: No Known Allergies  No medications prior to admission.    No results found for this or any previous visit (from the past 48 hour(s)). No results found.  Review of Systems  Constitutional: Negative.  Negative for fever.  HENT: Negative.   Eyes: Negative.   Respiratory: Negative.   Cardiovascular: Negative.   All other systems reviewed and are negative.   There were no vitals taken for this visit. Physical Exam  Constitutional: He appears well-developed.  Very pleasant, at baseline.   HENT:  Head: Normocephalic.  Cardiovascular: Normal rate.  Respiratory: Effort normal.  GI: Soft.  Genitourinary:    Genitourinary Comments: RLQ Urostomy pink and patent of non-foul urine. Left neph tube in place with non-foul  urine.    Musculoskeletal:        General: Normal range of motion.     Cervical back: Normal range of motion.  Neurological: He is alert.  Skin: Skin is warm.  Psychiatric: He has a normal mood and affect.     Assessment/Plan  Proceed as planned with LEFT robotic nephrectomy. First choice is retroperitoneal robotic as long as able to develop space. If unfaorable then transperitoneal. Risks, benefits, alternatives, expected peri-op course discussed previously and rieterated today.   Alexis Frock, MD 07/14/2019, 5:25 AM

## 2019-07-15 DIAGNOSIS — N2889 Other specified disorders of kidney and ureter: Secondary | ICD-10-CM | POA: Diagnosis present

## 2019-07-15 LAB — BASIC METABOLIC PANEL
Anion gap: 7 (ref 5–15)
BUN: 19 mg/dL (ref 8–23)
CO2: 28 mmol/L (ref 22–32)
Calcium: 9.5 mg/dL (ref 8.9–10.3)
Chloride: 98 mmol/L (ref 98–111)
Creatinine, Ser: 1.4 mg/dL — ABNORMAL HIGH (ref 0.61–1.24)
GFR calc Af Amer: 58 mL/min — ABNORMAL LOW (ref >60)
GFR calc non Af Amer: 50 mL/min — ABNORMAL LOW (ref >60)
Glucose, Bld: 128 mg/dL — ABNORMAL HIGH (ref 70–99)
Potassium: 4.7 mmol/L (ref 3.5–5.1)
Sodium: 133 mmol/L — ABNORMAL LOW (ref 135–145)

## 2019-07-15 LAB — HEMOGLOBIN AND HEMATOCRIT, BLOOD
HCT: 29.3 % — ABNORMAL LOW (ref 39.0–52.0)
Hemoglobin: 9.5 g/dL — ABNORMAL LOW (ref 13.0–17.0)

## 2019-07-15 MED ORDER — CIPROFLOXACIN HCL 500 MG PO TABS
500.0000 mg | ORAL_TABLET | Freq: Two times a day (BID) | ORAL | Status: DC
  Administered 2019-07-15: 500 mg via ORAL
  Filled 2019-07-15: qty 1

## 2019-07-15 MED ORDER — CIPROFLOXACIN HCL 500 MG PO TABS: 500.0000 mg | ORAL_TABLET | Freq: Two times a day (BID) | ORAL | 0 refills | Status: AC

## 2019-07-15 MED ORDER — POLYETHYLENE GLYCOL 3350 17 G PO PACK
17.0000 g | PACK | Freq: Every day | ORAL | Status: DC
  Administered 2019-07-15: 12:00:00 17 g via ORAL
  Filled 2019-07-15: qty 1

## 2019-07-15 NOTE — Plan of Care (Signed)
  Problem: Clinical Measurements: Goal: Ability to maintain clinical measurements within normal limits will improve Outcome: Progressing Goal: Will remain free from infection Outcome: Progressing Goal: Diagnostic test results will improve Outcome: Progressing Goal: Respiratory complications will improve Outcome: Progressing Goal: Cardiovascular complication will be avoided Outcome: Progressing   Problem: Clinical Measurements: Goal: Will remain free from infection Outcome: Progressing   Problem: Clinical Measurements: Goal: Diagnostic test results will improve Outcome: Progressing   Problem: Clinical Measurements: Goal: Respiratory complications will improve Outcome: Progressing   Problem: Clinical Measurements: Goal: Cardiovascular complication will be avoided Outcome: Progressing   Problem: Activity: Goal: Risk for activity intolerance will decrease Outcome: Progressing   Problem: Nutrition: Goal: Adequate nutrition will be maintained Outcome: Progressing   Problem: Coping: Goal: Level of anxiety will decrease Outcome: Progressing   Problem: Elimination: Goal: Will not experience complications related to bowel motility Outcome: Progressing Goal: Will not experience complications related to urinary retention Outcome: Progressing   Problem: Pain Managment: Goal: General experience of comfort will improve Outcome: Progressing   Problem: Safety: Goal: Ability to remain free from injury will improve Outcome: Progressing   Problem: Skin Integrity: Goal: Risk for impaired skin integrity will decrease Outcome: Progressing   Problem: Education: Goal: Knowledge of the prescribed therapeutic regimen will improve Outcome: Progressing   Problem: Bowel/Gastric: Goal: Gastrointestinal status for postoperative course will improve Outcome: Progressing   Problem: Clinical Measurements: Goal: Postoperative complications will be avoided or minimized Outcome:  Progressing   Problem: Respiratory: Goal: Ability to achieve and maintain a regular respiratory rate will improve Outcome: Progressing   Problem: Skin Integrity: Goal: Demonstration of wound healing without infection will improve Outcome: Progressing   Problem: Urinary Elimination: Goal: Ability to avoid or minimize complications of infection will improve Outcome: Progressing Goal: Ability to achieve and maintain urine output will improve Outcome: Progressing

## 2019-07-15 NOTE — Discharge Summary (Signed)
[facility] Discharge Summary  Admit date: 07/14/2019  Discharge date and time: 07/15/19   Discharge to: Home  Discharge Service: Urology  Discharge Attending Physician:  Junious Silk  Discharge  Diagnoses: Renal Mass  OR Procedures: Procedure(s): XI ROBOTIC ASSISTED LAPAROSCOPIC RETROPERITONEAL NEPHRECTOMY 07/14/2019   Ancillary Procedures: None   Discharge Day Services: The patient was seen and examined by the Urology team both in the morning prior to discharge.  Vital signs and laboratory values were stable and within normal limits.  The physical exam was benign and unchanged and all surgical wounds were examined.  Discharge instructions were explained and all questions answered.  Subjective  No acute events overnight. Pain Controlled. No fever or chills.  Objective Patient Vitals for the past 8 hrs:  BP Temp Temp src Pulse Resp SpO2  07/15/19 0558 138/70 98.4 F (36.9 C) Oral 81 18 99 %   Total I/O In: -  Out: 850 [Urine:850]  General Appearance:        No acute distress Lungs:                       Normal work of breathing on room air Heart:                                Regular rate and rhythm Abdomen:                         Soft, non-tender, non-distended.  Right lower quadrant urostomy with red-tinged urine Back:        Incisions clean dry and intact with Dermabond.  No hematoma. Extremities:                      Warm and well perfused   Hospital Course:  74 year old male with history of bladder cancer and nonfunctioning left kidney.  He underwent retroperitoneal nephrectomy on 07/14/2019.    The patient tolerated the procedure well, was extubated in the OR, and afterwards was taken to the PACU for routine post-surgical care. When stable the patient was transferred to the floor.     The patient did well postoperatively.  He tolerated regular diet, ambulated, had his pain controlled on postoperative day 1.  His urostomy was healthy and functioning well with good  urine output.    He will follow up with [place] for postoperative check on 07/31/2019 which is already scheduled.  He was discharged home on pain medicine and will continue with his preoperative antibiotic until completed.     Condition at Discharge: Improved  Discharge Medications:  Allergies as of 07/15/2019   No Known Allergies     Medication List    STOP taking these medications   co-enzyme Q-10 30 MG capsule   diclofenac 75 MG EC tablet Commonly known as: VOLTAREN   multivitamin with minerals Tabs tablet     TAKE these medications   amLODipine 10 MG tablet Commonly known as: NORVASC Take 1 tablet (10 mg total) by mouth daily.   ampicillin 500 MG capsule Commonly known as: PRINCIPEN Take 1 capsule (500 mg total) by mouth 3 (three) times daily. X 5 days now. Also begin 2 days before next Urology surgery.   atorvastatin 20 MG tablet Commonly known as: LIPITOR Take 1 tablet (20 mg total) by mouth daily.   ciprofloxacin 500 MG tablet Commonly known as: CIPRO Take 500 mg by  mouth 2 (two) times daily.   docusate sodium 100 MG capsule Commonly known as: COLACE Take 200 mg by mouth daily.   ferrous sulfate 325 (65 FE) MG tablet Take 1 tablet (325 mg total) by mouth daily with breakfast.   HYDROcodone-acetaminophen 5-325 MG tablet Commonly known as: Norco Take 1-2 tablets by mouth every 6 (six) hours as needed for moderate pain or severe pain.   lisinopril 5 MG tablet Commonly known as: ZESTRIL Take 1 tablet (5 mg total) by mouth daily.   nitrofurantoin 100 MG capsule Commonly known as: MACRODANTIN Take 100 mg by mouth daily.   polyvinyl alcohol 1.4 % ophthalmic solution Commonly known as: LIQUIFILM TEARS Place 1 drop into both eyes as needed for dry eyes.   rosuvastatin 10 MG tablet Commonly known as: CRESTOR Take 1 tablet (10 mg total) by mouth daily.   senna-docusate 8.6-50 MG tablet Commonly known as: Senokot-S Take 1 tablet by mouth daily as needed for  mild constipation.

## 2019-07-15 NOTE — Op Note (Signed)
NAME: YUJI, GISMONDI. MEDICAL RECORD [id number] ACCOUNT [id number] DATE OF BIRTH:[birth year] FACILITY: [facility] LOCATION: [facility] PHYSICIAN:Ladanian Kelter Tresa Moore, MD  OPERATIVE REPORT  DATE OF PROCEDURE:  07/14/2019  SURGEON:  Alexis Frock, MD  PREOPERATIVE DIAGNOSIS:  Atrophic left kidney with recurrent pyelonephritis.  PROCEDURE:  Robotic left retroperitoneal radical nephrectomy.  ESTIMATED BLOOD LOSS:  100 mL.  MEDICATIONS:  None.  SPECIMENS:  Left kidney for permanent pathology.  FINDINGS:  Two arteries, 1 vein, left renal vascular anatomy as anticipated.  INDICATIONS:  The patient is a 74 year old male with history of aggressive bladder cancer.  He is status post prior cystoprostatectomy with curative intent.  He has had very good oncologic outcome from this.  He unfortunately has developed a recurrent  left pyelonephritis.  This picture was concerning for possible obstruction.  He underwent and a left antegrade ureteroscopy, which revealed a relatively long segment mid ureteral stricture.  This was thought likely to be ischemic in nature.  He underwent a  trial of endoscopic evaluation of this.  However, he quickly recurred in terms of clinical obstruction and recurrent infections.  Options were discussed for management including major reconstruction with additional bowel interposition versus nephrectomy pending renal function.   He underwent nuclear medicine renogram, which only revealed 11% left relative function with nephrostomy tube in.  Given this, we agreed upon left nephrectomy as being most definitive as overall GFR is normal.  His right kidney has done well.  Informed  consent was then obtained and placed in the medical record.  DESCRIPTION OF PROCEDURE:  The patient being identified, the procedure being left robotic retroperitoneal radical nephrectomy was confirmed.  Procedure timeout was performed.  Intravenous antibiotics were administered.  General endotracheal anesthesia   induced.  The patient was placed into a left side up full flank position, pulling 15 degrees of table flexion, superior arm elevator, axillary roll, sequential pressure devices, bottom leg bent, top leg straight.  He was then further fastened to the  operating table using 3-inch tape with foam padding across the supraxiphoid chest and his pelvis.  His right lower quadrant urostomy was carefully padded after urostomy connected to straight drain and a sterile field was created, prepping and draping his entire left flank and  abdomen utilizing chlorhexidine gluconate.  The 12th and 11th ribs were marked.  There appeared to be a sufficient window between the 12th rib and the iliac crest for retroperitoneal approach.  Incision was made approximately 1 cm in length approximately  1 fingerbreadth inferoposterior to the tip of the 12th rib and digital dissection was performed using surgeon's finger down to the level of the posterior fascia which was pierced using Kelly forceps.  Additional digital dissection was performed in the  retroperitoneal space and the lower pole of the kidney was verified by palpation and the retroperitoneal space was developed posterior to the kidney with surgeon's finger as much as possible.  Next, the Spacemaker balloon apparatus was carefully advanced  into the same retroperitoneal space and under laparoscopic vision, was inflated to approximately 40 pumps, which resulted in excellent further development of the retroperitoneum.  This was held for approximately 90 seconds, then removed.  Next, using  finger-guided placement,  additional ports were placed as follows:  Left subcostal 41mm robotic port at the junction of the inferior portion of the 12th rib and psoas musculature, taking care to verify off the border of the rib, a 12 mm AirSeal port  just medial to the camera port just off the edge  of the psoas musculature assistant port, two 8 mm robotic port sites, 1 at the inferomedial as  possible in the retroperitoneal space and another bisecting the camera port and the inferomedial.  The robot  was then undocked and passed the electronic checks.  Initial attention was directed at identification of the hilum. The lower pole kidney area was identified and placed on anterior superior traction and keeping the psoas musculature as a floor.  The  aorta was identified, as were pulsations coursing towards the kidney and this was very carefully developed and a 2 artery, 1 vein, left renal vascular anatomy was identified as anticipated.  Lower pole artery was controlled using an extra-large Hem-o-Lok  clip proximal, vascular stapler distal.  The main artery was then controlled using an extra-large clip proximal, stapler distal and the vein was controlled using vascular load stapler.  This resulted in excellent hemostatic control of the complete  hilum.  Dissection then proceeded inferiorly on the medial aspect between the psoas musculature and lower pole of the kidney and then superiorly from the hilum towards the area of the upper pole.  The adrenal was encountered and a plane was chosen to  keep the adrenal with the nephrectomy specimen and medial adrenal vessels were taken down using vascular clipping.  Next, the dissection was pointed towards the anterior aspect of the kidney and this was dissected away from the peritoneum.  The ureter was  encountered, doubly clipped and ligated.  Gonadal vessels were controlled using vascular stapler.  This completely freed up the left nephrectomyspecimen.  This was placed in an EndoCatch bag for later retrieval.  The robot was then undocked.  Specimen was  retrieved by connecting the camera port and superior robotic port sites as this trajectory minimized incision across presumed nerves.  Incision was carried down to the fascia posteriorly, which was incised and then muscle splitting was performed and the  nephrectomy specimen was removed and set aside for  permanent pathology.  Extraction site was closed at the fascia using a figure-of-eight Novafil x6, followed by reapproximation of Scarpa's with running Vicryl.  All incision sites were infiltrated with dilute  lipolyzed Marcaine and closed at the level of the skin using subcuticular monocryl followed by Dermabond.  Procedure was terminated.  The patient tolerated the procedure well.  No immediate perioperative complications.  The patient was taken to the postanesthesia care unit in  stable condition with plan for admission.  VN/NUANCE  D:07/14/2019 T:07/14/2019 JOB:[id number]

## 2019-07-17 LAB — SURGICAL PATHOLOGY

## 2019-07-27 DIAGNOSIS — C44619 Basal cell carcinoma of skin of left upper limb, including shoulder: Secondary | ICD-10-CM | POA: Diagnosis not present

## 2019-07-27 DIAGNOSIS — D485 Neoplasm of uncertain behavior of skin: Secondary | ICD-10-CM | POA: Diagnosis not present

## 2019-07-27 DIAGNOSIS — D225 Melanocytic nevi of trunk: Secondary | ICD-10-CM | POA: Diagnosis not present

## 2019-07-27 DIAGNOSIS — L821 Other seborrheic keratosis: Secondary | ICD-10-CM | POA: Diagnosis not present

## 2019-07-27 DIAGNOSIS — Z1283 Encounter for screening for malignant neoplasm of skin: Secondary | ICD-10-CM | POA: Diagnosis not present

## 2019-07-31 DIAGNOSIS — N390 Urinary tract infection, site not specified: Secondary | ICD-10-CM | POA: Diagnosis not present

## 2019-08-21 ENCOUNTER — Ambulatory Visit: Payer: Medicare Other | Admitting: Physician Assistant

## 2019-08-21 ENCOUNTER — Ambulatory Visit (INDEPENDENT_AMBULATORY_CARE_PROVIDER_SITE_OTHER): Payer: Medicare Other | Admitting: Family Medicine

## 2019-08-21 ENCOUNTER — Encounter: Payer: Self-pay | Admitting: Family Medicine

## 2019-08-21 ENCOUNTER — Other Ambulatory Visit: Payer: Self-pay

## 2019-08-21 VITALS — BP 130/87 | HR 66 | Temp 99.1°F | Ht 66.0 in | Wt 212.0 lb

## 2019-08-21 DIAGNOSIS — D631 Anemia in chronic kidney disease: Secondary | ICD-10-CM

## 2019-08-21 DIAGNOSIS — N189 Chronic kidney disease, unspecified: Secondary | ICD-10-CM

## 2019-08-21 DIAGNOSIS — Z23 Encounter for immunization: Secondary | ICD-10-CM

## 2019-08-21 DIAGNOSIS — I10 Essential (primary) hypertension: Secondary | ICD-10-CM

## 2019-08-21 DIAGNOSIS — Z905 Acquired absence of kidney: Secondary | ICD-10-CM

## 2019-08-21 MED ORDER — AMLODIPINE BESYLATE 10 MG PO TABS: 10.0000 mg | ORAL_TABLET | Freq: Every day | ORAL | 3 refills | Status: DC

## 2019-08-21 NOTE — Progress Notes (Signed)
Subjective: CC: Hypertension PCP: Terald Sleeper, PA-C [id number] Billy Rasmussen is a 74 y.o. male presenting to clinic today for:  1.  Hypertension Patient reports chronic history of hypertension.  He reports compliance with Norvasc 10 mg daily and lisinopril 5 mg daily.  No chest pain.  He does have some dyspnea on exertion but notes that this has been an issue since he has been hospitalized.  He had recurrent hospitalizations in 2020 for bladder cancer.  He is status post resection of a left kidney, bladder, prostate.  He is trying to strengthen by walking and doing the physical therapy exercises that were provided to him.  Denies any edema.  No headache.  He has occasional dizziness.   ROS: Per HPI  No Known Allergies Past Medical History:  Diagnosis Date  . Arthritis    knees, shoulders, elbows  . Bladder cancer [facility])    urologist-  dr Junious Silk  . Diverticulosis of colon   . Fibromyalgia   . GERD (gastroesophageal reflux disease)   . History of diverticulitis of colon   . History of gastric ulcer    due to aleve  . Hypertension   . Lower urinary tract symptoms (LUTS)   . OSA (obstructive sleep apnea)    NON-COMPLIANT  CPAP  --- BUT PT USES OXYGEN AT NIGHT 2.5L VIA  (PT'S DECISION)  . PONV (postoperative nausea and vomiting)   . Psoriasis   . Sepsis [facility])    january 2021  . Tinnitus    right ear more, has tranmitter in right ear removable at hs  . Wears glasses   . Wears partial dentures     Current Outpatient Medications:  .  lisinopril (ZESTRIL) 5 MG tablet, Take 1 tablet (5 mg total) by mouth daily., Disp: 90 tablet, Rfl: 1 .  polyvinyl alcohol (LIQUIFILM TEARS) 1.4 % ophthalmic solution, Place 1 drop into both eyes as needed for dry eyes., Disp: 15 mL, Rfl: 0 .  amLODipine (NORVASC) 10 MG tablet, Take 1 tablet (10 mg total) by mouth daily., Disp: 30 tablet, Rfl: 0 .  atorvastatin (LIPITOR) 20 MG tablet, Take 1 tablet (20 mg total) by mouth daily. (Patient not  taking: Reported on 06/25/2019), Disp: 90 tablet, Rfl: 3 .  docusate sodium (COLACE) 100 MG capsule, Take 200 mg by mouth daily., Disp: , Rfl:  .  ferrous sulfate 325 (65 FE) MG tablet, Take 1 tablet (325 mg total) by mouth daily with breakfast. (Patient not taking: Reported on 08/21/2019), Disp: 30 tablet, Rfl: 0 Social History   Socioeconomic History  . Marital status: Married    Spouse name: Vickii Chafe  . Number of children: 2  . Years of education: Not on file  . Highest education level: Associate degree: occupational, Hotel manager, or vocational program  Occupational History  . 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 Smoking status: Former Smoker    Packs/day: 2.00    Years: 40.00    Pack years: 80.00    Types: Cigarettes    Quit date: 06/01/1997    Years since quitting: 22.2  . Smokeless tobacco: Never Used  Substance and Sexual Activity  . Alcohol use: No  . Drug use: No  . Sexual activity: Not Currently  Other Topics Concern  . Not on file  Social History Narrative  . Not on file   Social Determinants of Health   Financial Resource Strain: Low Risk   . Difficulty of Paying  Living Expenses: Not hard at all  Food Insecurity: Unknown  . Worried About Charity fundraiser in the Last Year: Patient refused  . Ran Out of Food in the Last Year: Patient refused  Transportation Needs: Unknown  . Lack of Transportation (Medical): Patient refused  . Lack of Transportation (Non-Medical): Patient refused  Physical Activity: Unknown  . Days of Exercise per Week: Patient refused  . Minutes of Exercise per Session: Patient refused  Stress:   . Feeling of Stress :   Social Connections: Unknown  . Frequency of Communication with Friends and Family: Patient refused  . Frequency of Social Gatherings with Friends and Family: Patient refused  . Attends Religious Services: Patient refused  . Active Member of Clubs or Organizations: Patient refused  . Attends  Archivist Meetings: Patient refused  . Marital Status: Patient refused  Intimate Partner Violence: Unknown  . Fear of Current or Ex-Partner: Patient refused  . Emotionally Abused: Patient refused  . Physically Abused: Patient refused  . Sexually Abused: Patient refused   Family History  Problem Relation Age of Onset  . Alzheimer's disease Mother   . Heart attack Father   . Heart disease Father   . Irregular heart beat Brother        DEFIB.  . Congestive Heart Failure Brother   . Diabetes Daughter     Objective: Office vital signs reviewed. BP 130/87   Pulse 66   Temp 99.1 F (37.3 C) (Temporal)   Ht 5\' 6"  (1.676 m)   Wt 212 lb (96.2 kg)   SpO2 98%   BMI 34.22 kg/m   Physical Examination:  General: Awake, alert, well nourished, No acute distress HEENT: Normal    Eyes: extraocular membranes intact, sclera white Cardio: regular rate and rhythm, S1S2 heard, no murmurs appreciated Pulm: clear to auscultation bilaterally, no wheezes, rhonchi or rales; normal work of breathing on room air Extremities: warm, well perfused, No edema, cyanosis or clubbing; +2 pulses bilaterally  Assessment/ Plan: 74 y.o. male   1. Essential hypertension Controlled.  Continue current regimen - Basic Metabolic Panel - amLODipine (NORVASC) 10 MG tablet; Take 1 tablet (10 mg total) by mouth daily.  Dispense: 90 tablet; Refill: 3  2. History of nephrectomy Check BMP.  Suspect has CKD, given trend since [id number]. - Basic Metabolic Panel - CBC  3. Anemia due to chronic kidney disease, unspecified CKD stage Check CBC - CBC   Orders Placed This Encounter  Procedures  . Basic Metabolic Panel  . CBC   No orders of the defined types were placed in this encounter.    Janora Norlander, DO [place] [phone]

## 2019-08-22 LAB — BASIC METABOLIC PANEL
BUN/Creatinine Ratio: 16 (ref 10–24)
BUN: 23 mg/dL (ref 8–27)
CO2: 25 mmol/L (ref 20–29)
Calcium: 10 mg/dL (ref 8.6–10.2)
Chloride: 101 mmol/L (ref 96–106)
Creatinine, Ser: 1.45 mg/dL — ABNORMAL HIGH (ref 0.76–1.27)
GFR calc Af Amer: 56 mL/min/{1.73_m2} — ABNORMAL LOW (ref >59)
GFR calc non Af Amer: 48 mL/min/{1.73_m2} — ABNORMAL LOW (ref >59)
Glucose: 72 mg/dL (ref 65–99)
Potassium: 4.7 mmol/L (ref 3.5–5.2)
Sodium: 138 mmol/L (ref 134–144)

## 2019-08-22 LAB — CBC
Hematocrit: 37.4 % — ABNORMAL LOW (ref 37.5–51.0)
Hemoglobin: 12.2 g/dL — ABNORMAL LOW (ref 13.0–17.7)
MCH: 28.8 pg (ref 26.6–33.0)
MCHC: 32.6 g/dL (ref 31.5–35.7)
MCV: 88 fL (ref 79–97)
Platelets: 219 10*3/uL (ref 150–450)
RBC: 4.24 x10E6/uL (ref 4.14–5.80)
RDW: 15.6 % — ABNORMAL HIGH (ref 11.6–15.4)
WBC: 6.1 10*3/uL (ref 3.4–10.8)

## 2019-08-24 DIAGNOSIS — D485 Neoplasm of uncertain behavior of skin: Secondary | ICD-10-CM | POA: Diagnosis not present

## 2019-08-24 DIAGNOSIS — Z08 Encounter for follow-up examination after completed treatment for malignant neoplasm: Secondary | ICD-10-CM | POA: Diagnosis not present

## 2019-08-24 DIAGNOSIS — Z85828 Personal history of other malignant neoplasm of skin: Secondary | ICD-10-CM | POA: Diagnosis not present

## 2019-09-11 ENCOUNTER — Telehealth: Payer: Self-pay | Admitting: Cardiovascular Disease

## 2019-09-11 MED ORDER — ATORVASTATIN CALCIUM 20 MG PO TABS: 20.0000 mg | ORAL_TABLET | Freq: Every day | ORAL | 1 refills | Status: DC

## 2019-09-11 NOTE — Telephone Encounter (Signed)
Called [facility] who report insurance will not pay for crestor. Explained this is not on med list, was d/c'ed in Jan 2021. Explained that atorvastatin is on list - refilled per request

## 2019-09-11 NOTE — Telephone Encounter (Signed)
New message:    pharmacy calling and would like for some one to call concerning cost of a medication. Please call the pharmacy.

## 2019-09-13 DIAGNOSIS — M9901 Segmental and somatic dysfunction of cervical region: Secondary | ICD-10-CM | POA: Diagnosis not present

## 2019-09-13 DIAGNOSIS — M9902 Segmental and somatic dysfunction of thoracic region: Secondary | ICD-10-CM | POA: Diagnosis not present

## 2019-09-13 DIAGNOSIS — M9903 Segmental and somatic dysfunction of lumbar region: Secondary | ICD-10-CM | POA: Diagnosis not present

## 2019-09-13 DIAGNOSIS — M62830 Muscle spasm of back: Secondary | ICD-10-CM | POA: Diagnosis not present

## 2019-09-14 DIAGNOSIS — M9902 Segmental and somatic dysfunction of thoracic region: Secondary | ICD-10-CM | POA: Diagnosis not present

## 2019-09-14 DIAGNOSIS — M9903 Segmental and somatic dysfunction of lumbar region: Secondary | ICD-10-CM | POA: Diagnosis not present

## 2019-09-14 DIAGNOSIS — M62830 Muscle spasm of back: Secondary | ICD-10-CM | POA: Diagnosis not present

## 2019-09-14 DIAGNOSIS — M9901 Segmental and somatic dysfunction of cervical region: Secondary | ICD-10-CM | POA: Diagnosis not present

## 2019-09-18 ENCOUNTER — Other Ambulatory Visit: Payer: Self-pay | Admitting: Cardiovascular Disease

## 2019-09-18 ENCOUNTER — Other Ambulatory Visit: Payer: Self-pay

## 2019-09-18 ENCOUNTER — Other Ambulatory Visit: Payer: Medicare Other

## 2019-09-18 DIAGNOSIS — E782 Mixed hyperlipidemia: Secondary | ICD-10-CM | POA: Diagnosis not present

## 2019-09-18 DIAGNOSIS — M9903 Segmental and somatic dysfunction of lumbar region: Secondary | ICD-10-CM | POA: Diagnosis not present

## 2019-09-18 DIAGNOSIS — M9902 Segmental and somatic dysfunction of thoracic region: Secondary | ICD-10-CM | POA: Diagnosis not present

## 2019-09-18 DIAGNOSIS — M9901 Segmental and somatic dysfunction of cervical region: Secondary | ICD-10-CM | POA: Diagnosis not present

## 2019-09-18 DIAGNOSIS — M62830 Muscle spasm of back: Secondary | ICD-10-CM | POA: Diagnosis not present

## 2019-09-19 LAB — LIPID PANEL
Chol/HDL Ratio: 4.6 ratio (ref 0.0–5.0)
Cholesterol, Total: 139 mg/dL (ref 100–199)
HDL: 30 mg/dL — ABNORMAL LOW (ref >39)
LDL Chol Calc (NIH): 83 mg/dL (ref 0–99)
Triglycerides: 145 mg/dL (ref 0–149)
VLDL Cholesterol Cal: 26 mg/dL (ref 5–40)

## 2019-09-20 DIAGNOSIS — M9901 Segmental and somatic dysfunction of cervical region: Secondary | ICD-10-CM | POA: Diagnosis not present

## 2019-09-20 DIAGNOSIS — M9903 Segmental and somatic dysfunction of lumbar region: Secondary | ICD-10-CM | POA: Diagnosis not present

## 2019-09-20 DIAGNOSIS — M62830 Muscle spasm of back: Secondary | ICD-10-CM | POA: Diagnosis not present

## 2019-09-20 DIAGNOSIS — M9902 Segmental and somatic dysfunction of thoracic region: Secondary | ICD-10-CM | POA: Diagnosis not present

## 2019-09-21 DIAGNOSIS — M9903 Segmental and somatic dysfunction of lumbar region: Secondary | ICD-10-CM | POA: Diagnosis not present

## 2019-09-21 DIAGNOSIS — M62830 Muscle spasm of back: Secondary | ICD-10-CM | POA: Diagnosis not present

## 2019-09-21 DIAGNOSIS — M9902 Segmental and somatic dysfunction of thoracic region: Secondary | ICD-10-CM | POA: Diagnosis not present

## 2019-09-21 DIAGNOSIS — M9901 Segmental and somatic dysfunction of cervical region: Secondary | ICD-10-CM | POA: Diagnosis not present

## 2019-09-25 DIAGNOSIS — M62830 Muscle spasm of back: Secondary | ICD-10-CM | POA: Diagnosis not present

## 2019-09-25 DIAGNOSIS — M9901 Segmental and somatic dysfunction of cervical region: Secondary | ICD-10-CM | POA: Diagnosis not present

## 2019-09-25 DIAGNOSIS — M9902 Segmental and somatic dysfunction of thoracic region: Secondary | ICD-10-CM | POA: Diagnosis not present

## 2019-09-25 DIAGNOSIS — M9903 Segmental and somatic dysfunction of lumbar region: Secondary | ICD-10-CM | POA: Diagnosis not present

## 2019-09-26 DIAGNOSIS — M9903 Segmental and somatic dysfunction of lumbar region: Secondary | ICD-10-CM | POA: Diagnosis not present

## 2019-09-26 DIAGNOSIS — M62830 Muscle spasm of back: Secondary | ICD-10-CM | POA: Diagnosis not present

## 2019-09-26 DIAGNOSIS — M9901 Segmental and somatic dysfunction of cervical region: Secondary | ICD-10-CM | POA: Diagnosis not present

## 2019-09-26 DIAGNOSIS — M9902 Segmental and somatic dysfunction of thoracic region: Secondary | ICD-10-CM | POA: Diagnosis not present

## 2019-09-27 DIAGNOSIS — M9901 Segmental and somatic dysfunction of cervical region: Secondary | ICD-10-CM | POA: Diagnosis not present

## 2019-09-27 DIAGNOSIS — M9903 Segmental and somatic dysfunction of lumbar region: Secondary | ICD-10-CM | POA: Diagnosis not present

## 2019-09-27 DIAGNOSIS — M9902 Segmental and somatic dysfunction of thoracic region: Secondary | ICD-10-CM | POA: Diagnosis not present

## 2019-09-27 DIAGNOSIS — M62830 Muscle spasm of back: Secondary | ICD-10-CM | POA: Diagnosis not present

## 2019-10-02 DIAGNOSIS — M9901 Segmental and somatic dysfunction of cervical region: Secondary | ICD-10-CM | POA: Diagnosis not present

## 2019-10-02 DIAGNOSIS — M9903 Segmental and somatic dysfunction of lumbar region: Secondary | ICD-10-CM | POA: Diagnosis not present

## 2019-10-02 DIAGNOSIS — M62830 Muscle spasm of back: Secondary | ICD-10-CM | POA: Diagnosis not present

## 2019-10-02 DIAGNOSIS — M9902 Segmental and somatic dysfunction of thoracic region: Secondary | ICD-10-CM | POA: Diagnosis not present

## 2019-10-04 DIAGNOSIS — M9902 Segmental and somatic dysfunction of thoracic region: Secondary | ICD-10-CM | POA: Diagnosis not present

## 2019-10-04 DIAGNOSIS — M9901 Segmental and somatic dysfunction of cervical region: Secondary | ICD-10-CM | POA: Diagnosis not present

## 2019-10-04 DIAGNOSIS — M62830 Muscle spasm of back: Secondary | ICD-10-CM | POA: Diagnosis not present

## 2019-10-04 DIAGNOSIS — M9903 Segmental and somatic dysfunction of lumbar region: Secondary | ICD-10-CM | POA: Diagnosis not present

## 2019-10-05 DIAGNOSIS — M9901 Segmental and somatic dysfunction of cervical region: Secondary | ICD-10-CM | POA: Diagnosis not present

## 2019-10-05 DIAGNOSIS — M9902 Segmental and somatic dysfunction of thoracic region: Secondary | ICD-10-CM | POA: Diagnosis not present

## 2019-10-05 DIAGNOSIS — M62830 Muscle spasm of back: Secondary | ICD-10-CM | POA: Diagnosis not present

## 2019-10-05 DIAGNOSIS — M9903 Segmental and somatic dysfunction of lumbar region: Secondary | ICD-10-CM | POA: Diagnosis not present

## 2019-10-09 DIAGNOSIS — M62830 Muscle spasm of back: Secondary | ICD-10-CM | POA: Diagnosis not present

## 2019-10-09 DIAGNOSIS — M9903 Segmental and somatic dysfunction of lumbar region: Secondary | ICD-10-CM | POA: Diagnosis not present

## 2019-10-09 DIAGNOSIS — M9902 Segmental and somatic dysfunction of thoracic region: Secondary | ICD-10-CM | POA: Diagnosis not present

## 2019-10-09 DIAGNOSIS — M9901 Segmental and somatic dysfunction of cervical region: Secondary | ICD-10-CM | POA: Diagnosis not present

## 2019-10-12 DIAGNOSIS — M9901 Segmental and somatic dysfunction of cervical region: Secondary | ICD-10-CM | POA: Diagnosis not present

## 2019-10-12 DIAGNOSIS — M62830 Muscle spasm of back: Secondary | ICD-10-CM | POA: Diagnosis not present

## 2019-10-12 DIAGNOSIS — M9902 Segmental and somatic dysfunction of thoracic region: Secondary | ICD-10-CM | POA: Diagnosis not present

## 2019-10-12 DIAGNOSIS — M9903 Segmental and somatic dysfunction of lumbar region: Secondary | ICD-10-CM | POA: Diagnosis not present

## 2019-10-18 DIAGNOSIS — M9903 Segmental and somatic dysfunction of lumbar region: Secondary | ICD-10-CM | POA: Diagnosis not present

## 2019-10-18 DIAGNOSIS — M9902 Segmental and somatic dysfunction of thoracic region: Secondary | ICD-10-CM | POA: Diagnosis not present

## 2019-10-18 DIAGNOSIS — M9901 Segmental and somatic dysfunction of cervical region: Secondary | ICD-10-CM | POA: Diagnosis not present

## 2019-10-18 DIAGNOSIS — M62830 Muscle spasm of back: Secondary | ICD-10-CM | POA: Diagnosis not present

## 2019-10-23 DIAGNOSIS — M9903 Segmental and somatic dysfunction of lumbar region: Secondary | ICD-10-CM | POA: Diagnosis not present

## 2019-10-23 DIAGNOSIS — M62830 Muscle spasm of back: Secondary | ICD-10-CM | POA: Diagnosis not present

## 2019-10-23 DIAGNOSIS — M9902 Segmental and somatic dysfunction of thoracic region: Secondary | ICD-10-CM | POA: Diagnosis not present

## 2019-10-23 DIAGNOSIS — M9901 Segmental and somatic dysfunction of cervical region: Secondary | ICD-10-CM | POA: Diagnosis not present

## 2019-10-31 ENCOUNTER — Other Ambulatory Visit (HOSPITAL_COMMUNITY): Payer: Self-pay | Admitting: Urology

## 2019-10-31 ENCOUNTER — Other Ambulatory Visit: Payer: Self-pay

## 2019-10-31 ENCOUNTER — Ambulatory Visit (HOSPITAL_COMMUNITY)
Admission: RE | Admit: 2019-10-31 | Discharge: 2019-10-31 | Disposition: A | Discharge status: Home / Self Care | Payer: Medicare Other | Source: Ambulatory Visit | Attending: Urology | Admitting: Urology

## 2019-10-31 DIAGNOSIS — M9903 Segmental and somatic dysfunction of lumbar region: Secondary | ICD-10-CM | POA: Diagnosis not present

## 2019-10-31 DIAGNOSIS — C679 Malignant neoplasm of bladder, unspecified: Secondary | ICD-10-CM | POA: Diagnosis not present

## 2019-10-31 DIAGNOSIS — C678 Malignant neoplasm of overlapping sites of bladder: Secondary | ICD-10-CM | POA: Insufficient documentation

## 2019-10-31 DIAGNOSIS — M62830 Muscle spasm of back: Secondary | ICD-10-CM | POA: Diagnosis not present

## 2019-10-31 DIAGNOSIS — M9902 Segmental and somatic dysfunction of thoracic region: Secondary | ICD-10-CM | POA: Diagnosis not present

## 2019-10-31 DIAGNOSIS — M9901 Segmental and somatic dysfunction of cervical region: Secondary | ICD-10-CM | POA: Diagnosis not present

## 2019-11-06 DIAGNOSIS — M9901 Segmental and somatic dysfunction of cervical region: Secondary | ICD-10-CM | POA: Diagnosis not present

## 2019-11-06 DIAGNOSIS — M9902 Segmental and somatic dysfunction of thoracic region: Secondary | ICD-10-CM | POA: Diagnosis not present

## 2019-11-06 DIAGNOSIS — M62830 Muscle spasm of back: Secondary | ICD-10-CM | POA: Diagnosis not present

## 2019-11-06 DIAGNOSIS — M9903 Segmental and somatic dysfunction of lumbar region: Secondary | ICD-10-CM | POA: Diagnosis not present

## 2019-11-07 DIAGNOSIS — C678 Malignant neoplasm of overlapping sites of bladder: Secondary | ICD-10-CM | POA: Diagnosis not present

## 2019-11-07 DIAGNOSIS — N10 Acute tubulo-interstitial nephritis: Secondary | ICD-10-CM | POA: Diagnosis not present

## 2019-11-07 DIAGNOSIS — Z936 Other artificial openings of urinary tract status: Secondary | ICD-10-CM | POA: Diagnosis not present

## 2019-11-09 ENCOUNTER — Telehealth: Payer: Self-pay | Admitting: Family Medicine

## 2019-11-09 NOTE — Telephone Encounter (Signed)
Requesting medication

## 2019-11-09 NOTE — Telephone Encounter (Signed)
Pt wants to get back on diclofenac (VOLTAREN) 75 MG EC tablet  For arthritis. It really works and he is aware that may be an issue to get back on because of one kidney. Or what other medication can he take for arthritis. Tylenol is not working and does not want to take morton. Use [facility] Please call back. Pt is aware Lajuana Ripple off today.

## 2019-11-09 NOTE — Telephone Encounter (Signed)
Will defer to PCP, because of renal function did not know if he was discontinued from this or not based on looking at his chart, will let her look at it and she will know if she is discontinued it or if wanted to keep it.

## 2019-11-10 NOTE — Telephone Encounter (Signed)
Pt aware.

## 2019-11-10 NOTE — Telephone Encounter (Signed)
I agree no diclofenac or NSAIDs due to renal function.  Unfortunately outside of tylenol arthritis and TOPICAL voltaren gel there are not good kidney safe medications out there for arthritis.  If he's not used TOPICAL voltaren, he can.  Ok to use up to 4 times daily if needed.

## 2019-11-13 DIAGNOSIS — M9901 Segmental and somatic dysfunction of cervical region: Secondary | ICD-10-CM | POA: Diagnosis not present

## 2019-11-13 DIAGNOSIS — M9902 Segmental and somatic dysfunction of thoracic region: Secondary | ICD-10-CM | POA: Diagnosis not present

## 2019-11-13 DIAGNOSIS — M62830 Muscle spasm of back: Secondary | ICD-10-CM | POA: Diagnosis not present

## 2019-11-13 DIAGNOSIS — M9903 Segmental and somatic dysfunction of lumbar region: Secondary | ICD-10-CM | POA: Diagnosis not present

## 2019-11-14 ENCOUNTER — Other Ambulatory Visit: Payer: Self-pay | Admitting: *Deleted

## 2019-11-14 DIAGNOSIS — Z23 Encounter for immunization: Secondary | ICD-10-CM | POA: Diagnosis not present

## 2019-11-14 MED ORDER — LISINOPRIL 5 MG PO TABS: 5.0000 mg | ORAL_TABLET | Freq: Every day | ORAL | 0 refills | Status: DC

## 2019-11-21 DIAGNOSIS — N3000 Acute cystitis without hematuria: Secondary | ICD-10-CM | POA: Diagnosis not present

## 2019-12-19 ENCOUNTER — Ambulatory Visit: Payer: Medicare Other | Admitting: Physician Assistant

## 2019-12-21 DIAGNOSIS — D485 Neoplasm of uncertain behavior of skin: Secondary | ICD-10-CM | POA: Diagnosis not present

## 2019-12-21 DIAGNOSIS — Z85828 Personal history of other malignant neoplasm of skin: Secondary | ICD-10-CM | POA: Diagnosis not present

## 2019-12-21 DIAGNOSIS — Z08 Encounter for follow-up examination after completed treatment for malignant neoplasm: Secondary | ICD-10-CM | POA: Diagnosis not present

## 2019-12-21 DIAGNOSIS — D2262 Melanocytic nevi of left upper limb, including shoulder: Secondary | ICD-10-CM | POA: Diagnosis not present

## 2019-12-26 ENCOUNTER — Emergency Department (HOSPITAL_COMMUNITY): Payer: Medicare Other

## 2019-12-26 ENCOUNTER — Emergency Department (HOSPITAL_COMMUNITY)
Admission: EM | Admit: 2019-12-26 | Discharge: 2019-12-26 | Disposition: A | Discharge status: Home / Self Care | Payer: Medicare Other | Source: Home / Self Care | Attending: Emergency Medicine | Admitting: Emergency Medicine

## 2019-12-26 ENCOUNTER — Other Ambulatory Visit: Payer: Self-pay

## 2019-12-26 DIAGNOSIS — I10 Essential (primary) hypertension: Secondary | ICD-10-CM | POA: Insufficient documentation

## 2019-12-26 DIAGNOSIS — Z8551 Personal history of malignant neoplasm of bladder: Secondary | ICD-10-CM | POA: Insufficient documentation

## 2019-12-26 DIAGNOSIS — R21 Rash and other nonspecific skin eruption: Secondary | ICD-10-CM | POA: Diagnosis not present

## 2019-12-26 DIAGNOSIS — Z87891 Personal history of nicotine dependence: Secondary | ICD-10-CM | POA: Insufficient documentation

## 2019-12-26 DIAGNOSIS — N390 Urinary tract infection, site not specified: Secondary | ICD-10-CM

## 2019-12-26 DIAGNOSIS — B961 Klebsiella pneumoniae [K. pneumoniae] as the cause of diseases classified elsewhere: Secondary | ICD-10-CM | POA: Diagnosis not present

## 2019-12-26 DIAGNOSIS — Z20822 Contact with and (suspected) exposure to covid-19: Secondary | ICD-10-CM | POA: Diagnosis not present

## 2019-12-26 DIAGNOSIS — E871 Hypo-osmolality and hyponatremia: Secondary | ICD-10-CM | POA: Diagnosis not present

## 2019-12-26 DIAGNOSIS — A419 Sepsis, unspecified organism: Secondary | ICD-10-CM | POA: Diagnosis not present

## 2019-12-26 DIAGNOSIS — I517 Cardiomegaly: Secondary | ICD-10-CM | POA: Diagnosis not present

## 2019-12-26 DIAGNOSIS — Z79899 Other long term (current) drug therapy: Secondary | ICD-10-CM | POA: Insufficient documentation

## 2019-12-26 DIAGNOSIS — R7881 Bacteremia: Secondary | ICD-10-CM | POA: Diagnosis not present

## 2019-12-26 DIAGNOSIS — N179 Acute kidney failure, unspecified: Secondary | ICD-10-CM | POA: Diagnosis not present

## 2019-12-26 DIAGNOSIS — R509 Fever, unspecified: Secondary | ICD-10-CM | POA: Diagnosis not present

## 2019-12-26 LAB — PROTIME-INR
INR: 1.2 (ref 0.8–1.2)
Prothrombin Time: 14.8 seconds (ref 11.4–15.2)

## 2019-12-26 LAB — URINALYSIS, ROUTINE W REFLEX MICROSCOPIC
Bilirubin Urine: NEGATIVE
Glucose, UA: NEGATIVE mg/dL
Ketones, ur: NEGATIVE mg/dL
Nitrite: POSITIVE — AB
Protein, ur: 100 mg/dL — AB
Specific Gravity, Urine: 1.01 (ref 1.005–1.030)
pH: 6.5 (ref 5.0–8.0)

## 2019-12-26 LAB — COMPREHENSIVE METABOLIC PANEL
ALT: 35 U/L (ref 0–44)
AST: 39 U/L (ref 15–41)
Albumin: 5.2 g/dL — ABNORMAL HIGH (ref 3.5–5.0)
Alkaline Phosphatase: 63 U/L (ref 38–126)
Anion gap: 10 (ref 5–15)
BUN: 22 mg/dL (ref 8–23)
CO2: 26 mmol/L (ref 22–32)
Calcium: 10.1 mg/dL (ref 8.9–10.3)
Chloride: 96 mmol/L — ABNORMAL LOW (ref 98–111)
Creatinine, Ser: 1.54 mg/dL — ABNORMAL HIGH (ref 0.61–1.24)
GFR calc Af Amer: 51 mL/min — ABNORMAL LOW (ref >60)
GFR calc non Af Amer: 44 mL/min — ABNORMAL LOW (ref >60)
Glucose, Bld: 120 mg/dL — ABNORMAL HIGH (ref 70–99)
Potassium: 4.5 mmol/L (ref 3.5–5.1)
Sodium: 132 mmol/L — ABNORMAL LOW (ref 135–145)
Total Bilirubin: 1.8 mg/dL — ABNORMAL HIGH (ref 0.3–1.2)
Total Protein: 9.5 g/dL — ABNORMAL HIGH (ref 6.5–8.1)

## 2019-12-26 LAB — LACTIC ACID, PLASMA
Lactic Acid, Venous: 1.2 mmol/L (ref 0.5–1.9)
Lactic Acid, Venous: 1.1 mmol/L (ref 0.5–1.9)

## 2019-12-26 LAB — CBC WITH DIFFERENTIAL/PLATELET
Abs Immature Granulocytes: 0.1 10*3/uL — ABNORMAL HIGH (ref 0.00–0.07)
Basophils Absolute: 0 10*3/uL (ref 0.0–0.1)
Basophils Relative: 0 %
Eosinophils Absolute: 0 10*3/uL (ref 0.0–0.5)
Eosinophils Relative: 0 %
HCT: 43.3 % (ref 39.0–52.0)
Hemoglobin: 14.2 g/dL (ref 13.0–17.0)
Immature Granulocytes: 1 %
Lymphocytes Relative: 8 %
Lymphs Abs: 1.3 10*3/uL (ref 0.7–4.0)
MCH: 30.7 pg (ref 26.0–34.0)
MCHC: 32.8 g/dL (ref 30.0–36.0)
MCV: 93.5 fL (ref 80.0–100.0)
Monocytes Absolute: 1.6 10*3/uL — ABNORMAL HIGH (ref 0.1–1.0)
Monocytes Relative: 10 %
Neutro Abs: 12.8 10*3/uL — ABNORMAL HIGH (ref 1.7–7.7)
Neutrophils Relative %: 81 %
Platelets: 159 10*3/uL (ref 150–400)
RBC: 4.63 MIL/uL (ref 4.22–5.81)
RDW: 14.7 % (ref 11.5–15.5)
WBC: 15.9 10*3/uL — ABNORMAL HIGH (ref 4.0–10.5)
nRBC: 0 % (ref 0.0–0.2)

## 2019-12-26 LAB — URINALYSIS, MICROSCOPIC (REFLEX)

## 2019-12-26 MED ORDER — ACETAMINOPHEN 325 MG PO TABS
650.0000 mg | ORAL_TABLET | Freq: Once | ORAL | Status: AC
  Administered 2019-12-26: 650 mg via ORAL
  Filled 2019-12-26: qty 2

## 2019-12-26 MED ORDER — SODIUM CHLORIDE 0.9 % IV BOLUS
1000.0000 mL | Freq: Once | INTRAVENOUS | Status: AC
  Administered 2019-12-26: 1000 mL via INTRAVENOUS

## 2019-12-26 MED ORDER — CIPROFLOXACIN IN D5W 400 MG/200ML IV SOLN
400.0000 mg | Freq: Once | INTRAVENOUS | Status: AC
  Administered 2019-12-26: 400 mg via INTRAVENOUS
  Filled 2019-12-26: qty 200

## 2019-12-26 MED ORDER — CIPROFLOXACIN HCL 500 MG PO TABS
500.0000 mg | ORAL_TABLET | Freq: Two times a day (BID) | ORAL | 0 refills | Status: DC
Start: 2019-12-26 — End: 2020-03-26

## 2019-12-26 NOTE — ED Triage Notes (Signed)
Patient reports he believes he has a uti. Patient reports he has had them before and this feels similar. Patient reports he only has 1 kidney.

## 2019-12-26 NOTE — ED Provider Notes (Signed)
[facility] DEPT Provider Note   CSN: [id number] Arrival date & time: 12/26/19  1856     History Chief Complaint  Patient presents with  . Dysuria    Billy Rasmussen is a 74 y.o. male hx of GERD, HTN, bladder cancer status post urostomy, here presenting with fever, cloudy urine.  Patient woke up this morning and had a fever 102 at home.  Patient also has recurrent UTIs and wife noticed that his urine is more cloudy than usual.  Patient's previous urine culture showed Klebsiella and E. Coli.  Patient has a right nephrectomy previously.   The history is provided by the patient.       Past Medical History:  Diagnosis Date  . Arthritis    knees, shoulders, elbows  . Bladder cancer [facility])    urologist-  dr Junious Silk  . Diverticulosis of colon   . Fibromyalgia   . GERD (gastroesophageal reflux disease)   . History of diverticulitis of colon   . History of gastric ulcer    due to aleve  . Hypertension   . Lower urinary tract symptoms (LUTS)   . OSA (obstructive sleep apnea)    NON-COMPLIANT  CPAP  --- BUT PT USES OXYGEN AT NIGHT 2.5L VIA [place] (PT'S DECISION)  . PONV (postoperative nausea and vomiting)   . Psoriasis   . Sepsis [facility])    january 2021  . Tinnitus    right ear more, has tranmitter in right ear removable at hs  . Wears glasses   . Wears partial dentures     Patient Active Problem List   Diagnosis Date Noted  . Renal mass 07/15/2019  . Nonfunctioning kidney 07/14/2019  . UTI (urinary tract infection) 06/25/2019  . OSA (obstructive sleep apnea) 05/12/2019  . Fever 04/09/2019  . SIRS (systemic inflammatory response syndrome) ([place]) 04/09/2019  . Sepsis due to urinary tract infection ([place]) 02/17/2019  . Abnormal renal function 01/20/2019  . Enterococcus UTI   . Urinary tract infection without hematuria   . Acute pyelonephritis   . Gastroesophageal reflux disease   . Sepsis ([place]) 12/22/2018  . AKI (acute kidney injury) ([place])  12/22/2018  . Bladder cancer ([place]) 11/09/2018  . 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 Acute cystitis with hematuria 10/02/2016  . Pure hypercholesterolemia 10/02/2016  . Malignant neoplasm of urinary bladder ([place]) 10/02/2016  . Lipoma 09/30/2016  . Obese 03/31/2016  . S/P right TKA 03/30/2016  . S/P knee replacement 03/30/2016  . HTN (hypertension) 02/25/2016  . Healthcare maintenance 02/25/2016  . Obesity (BMI 30-39.9) 01/07/2016  . S/P shoulder replacement 07/20/2013  . Shoulder arthritis 04/07/2013  . CELLULITIS, KNEE, LEFT 08/26/2006    Past Surgical History:  Procedure Laterality Date  . APPENDECTOMY    . COLECTOMY W/ COLOSTOMY  1996   W/   APPENDECTOMY  . COLONOSCOPY N/A 05/11/2018   Procedure: COLONOSCOPY;  Surgeon: Daneil Dolin, MD;  [facility];  Service: Endoscopy;  Laterality: N/A;  9:30  . COLOSTOMY TAKEDOWN  1996  . CYSTOSCOPY WITH BIOPSY N/A 12/05/2013   Procedure: CYSTO BLADDER BIOPSY AND FULGERATION;  Surgeon: Festus Aloe, MD;  Location: [facility];  Service: Urology;  Laterality: N/A;  . CYSTOSCOPY WITH BIOPSY Bilateral 11/13/2014   Procedure: CYSTOSCOPY WITH  BLADDER BIOPSY FULGERATION AND BILATERAL RETROGRADE PYELOGRAMS;  Surgeon: Festus Aloe, MD;  Location: [facility];  Service: Urology;  Laterality: Bilateral;  . CYSTOSCOPY WITH FULGERATION N/A 01/18/2018   Procedure: CYSTOSCOPY  WITH FULGERATION/ BLADDER BIOPSY;  Surgeon: Festus Aloe, MD;  Location: [facility];  Service: Urology;  Laterality: N/A;  . CYSTOSCOPY WITH INJECTION N/A 11/09/2018   Procedure: CYSTOSCOPY WITH INJECTION OF INDOCYANINE GREEN DYE;  Surgeon: Alexis Frock, MD;  Location: [facility];  Service: Urology;  Laterality: N/A;  . CYSTOSCOPY WITH INSERTION OF UROLIFT N/A 01/18/2018   Procedure: CYSTOSCOPY WITH INSERTION OF UROLIFT;  Surgeon: Festus Aloe, MD;  Location[facility];  Service: Urology;  Laterality:  N/A;  . CYSTOSCOPY/URETEROSCOPY/HOLMIUM LASER Left 02/17/2019   Procedure: URETEROSCOPY WITH BALLOON DILATION  AND NEPHROSTOGRAM;  Surgeon: Alexis Frock, MD;  Location[facility];  Service: Urology;  Laterality: Left;  1 HR  . EXCISION RIGHT UPPER ARM LIPOMA  2005  . HEMORROIDECTOMY    . INGUINAL HERNIA REPAIR Left 1984  . IR NEPHROSTOMY PLACEMENT LEFT  01/05/2019  . KNEE ARTHROSCOPY Left X3  LAST ONE  2002  . ORIF LEFT HUMEROUS FX  1976  . POLYPECTOMY  05/11/2018   Procedure: POLYPECTOMY;  Surgeon: Daneil Dolin, MD;  [facility];  Service: Endoscopy;;  . PROSTATE SURGERY    . ROBOT ASSISTED LAPAROSCOPIC NEPHRECTOMY Left 07/14/2019   Procedure: XI ROBOTIC ASSISTED LAPAROSCOPIC RETROPERITONEAL NEPHRECTOMY;  Surgeon: Alexis Frock, MD;  Location[facility];  Service: Urology;  Laterality: Left;  3 HRS  . TONSILLECTOMY  AS CHILD  . TOTAL KNEE ARTHROPLASTY Left 2006   REVISION 2007  (AFTER I & D WITH ANTIBIOTIC SPACER PROCEDURE FOR STEPH INFECTION)  . TOTAL KNEE ARTHROPLASTY Right 03/30/2016   Procedure: RIGHT TOTAL KNEE ARTHROPLASTY;  Surgeon: Paralee Cancel, MD;  Location[facility];  Service: Orthopedics;  Laterality: Right;  . TOTAL SHOULDER ARTHROPLASTY Right 04/06/2013   Procedure: RIGHT TOTAL SHOULDER ARTHROPLASTY;  Surgeon: Marin Shutter, MD;  Location: [facility];  Service: Orthopedics;  Laterality: Right;  . TOTAL SHOULDER ARTHROPLASTY Left 07/20/2013   Procedure: LEFT TOTAL SHOULDER ARTHROPLASTY;  Surgeon: Marin Shutter, MD;  Location: [place];  Service: Orthopedics;  Laterality: Left;  . TRANSURETHRAL RESECTION OF BLADDER TUMOR N/A 11/12/2015   Procedure: TRANSURETHRAL RESECTION OF BLADDER TUMOR (TURBT);  Surgeon: Festus Aloe, MD;  Location: [facility];  Service: Urology;  Laterality: N/A;  . TRANSURETHRAL RESECTION OF BLADDER TUMOR WITH GYRUS (TURBT-GYRUS) N/A 12/05/2013   Procedure: TRANSURETHRAL RESECTION OF BLADDER TUMOR WITH GYRUS  (TURBT-GYRUS);  Surgeon: Festus Aloe, MD;  Location: [facility];  Service: Urology;  Laterality: N/A;       Family History  Problem Relation Age of Onset  . Alzheimer's disease Mother   . Heart attack Father   . Heart disease Father   . Irregular heart beat Brother        DEFIB.  . Congestive Heart Failure Brother   . Diabetes Daughter     Social History   Tobacco Use  . Smoking status: Former Smoker    Packs/day: 2.00    Years: 40.00    Pack years: 80.00    Types: Cigarettes    Quit date: 06/01/1997    Years since quitting: 22.5  . Smokeless tobacco: Never Used  Vaping Use  . Vaping Use: Never used  Substance Use Topics  . Alcohol use: No  . Drug use: No    Home Medications Prior to Admission medications   Medication Sig Start Date End Date Taking? Authorizing Provider  amLODipine (NORVASC) 10 MG tablet Take 1 tablet (10 mg total) by mouth  daily. 08/21/19 08/20/20  Ronnie Doss M, DO  atorvastatin (LIPITOR) 20 MG tablet Take 1 tablet (20 mg total) by mouth daily. 09/11/19 12/10/19  Troy Sine, MD  docusate sodium (COLACE) 100 MG capsule Take 200 mg by mouth daily.    [provider]  ferrous sulfate 325 (65 FE) MG tablet Take 1 tablet (325 mg total) by mouth daily with breakfast. Patient not taking: Reported on 08/21/2019 05/16/19   Vicenta Dunning, MD  lisinopril (ZESTRIL) 5 MG tablet Take 1 tablet (5 mg total) by mouth daily. 11/14/19   Janora Norlander, DO  polyvinyl alcohol (LIQUIFILM TEARS) 1.4 % ophthalmic solution Place 1 drop into both eyes as needed for dry eyes. 05/16/19   Vicenta Dunning, MD    Allergies    Patient has no known allergies.  Review of Systems   Review of Systems  Genitourinary: Positive for dysuria.  All other systems reviewed and are negative.   Physical Exam Updated Vital Signs BP (!) 151/77 (BP Location: Right Arm)   Pulse 104   Temp (!) 100.9 F (38.3 C) (Oral)   Resp 17   SpO2 97%    Physical Exam Vitals and nursing note reviewed.  Constitutional:      Comments: Chronically ill   HENT:     Head: Normocephalic.     Right Ear: Tympanic membrane normal.     Left Ear: Tympanic membrane normal.     Nose: Nose normal.     Mouth/Throat:     Mouth: Mucous membranes are moist.  Eyes:     Extraocular Movements: Extraocular movements intact.     Pupils: Pupils are equal, round, and reactive to light.  Cardiovascular:     Rate and Rhythm: Normal rate and regular rhythm.     Pulses: Normal pulses.     Heart sounds: Normal heart sounds.  Pulmonary:     Effort: Pulmonary effort is normal.     Breath sounds: Normal breath sounds.  Abdominal:     General: Abdomen is flat.     Comments: Urostomy with dark urine.  No abdominal tenderness or CVA tenderness.  Musculoskeletal:        General: Normal range of motion.     Cervical back: Normal range of motion.  Skin:    General: Skin is warm.     Capillary Refill: Capillary refill takes less than 2 seconds.  Neurological:     General: No focal deficit present.     Mental Status: He is alert and oriented to person, place, and time.  Psychiatric:        Mood and Affect: Mood normal.        Behavior: Behavior normal.        Thought Content: Thought content normal.     ED Results / Procedures / Treatments   Labs (all labs ordered are listed, but only abnormal results are displayed) Labs Reviewed  COMPREHENSIVE METABOLIC PANEL - Abnormal; Notable for the following components:      Result Value   Sodium 132 (*)    Chloride 96 (*)    Glucose, Bld 120 (*)    Creatinine, Ser 1.54 (*)    Total Protein 9.5 (*)    Albumin 5.2 (*)    Total Bilirubin 1.8 (*)    GFR calc non Af Amer 44 (*)    GFR calc Af Amer 51 (*)    All other components within normal limits  CBC WITH DIFFERENTIAL/PLATELET - Abnormal; Notable  for the following components:   WBC 15.9 (*)    Neutro Abs 12.8 (*)    Monocytes Absolute 1.6 (*)    Abs  Immature Granulocytes 0.10 (*)    All other components within normal limits  URINALYSIS, ROUTINE W REFLEX MICROSCOPIC - Abnormal; Notable for the following components:   Hgb urine dipstick LARGE (*)    Protein, ur 100 (*)    Nitrite POSITIVE (*)    Leukocytes,Ua MODERATE (*)    All other components within normal limits  URINALYSIS, MICROSCOPIC (REFLEX) - Abnormal; Notable for the following components:   Bacteria, UA FEW (*)    All other components within normal limits  CULTURE, BLOOD (ROUTINE X 2)  CULTURE, BLOOD (ROUTINE X 2)  URINE CULTURE  LACTIC ACID, PLASMA  LACTIC ACID, PLASMA  PROTIME-INR    EKG None  Radiology DG Chest 2 View  Result Date: 12/26/2019 CLINICAL DATA:  74 year old male with sepsis EXAM: CHEST - 2 VIEW COMPARISON:  Chest radiograph dated 10/31/2019. FINDINGS: There is no focal consolidation, pleural effusion, or pneumothorax. Mild cardiomegaly. No acute osseous pathology. Bilateral shoulder arthroplasties. IMPRESSION: No active cardiopulmonary disease. Electronically Signed   By: Anner Crete M.D.   On: 12/26/2019 19:35    Procedures Procedures (including critical care time)  Medications Ordered in ED Medications  ciprofloxacin (CIPRO) IVPB 400 mg (400 mg Intravenous New Bag/Given 12/26/19 2217)  sodium chloride 0.9 % bolus 1,000 mL (1,000 mLs Intravenous New Bag/Given 12/26/19 2217)  acetaminophen (TYLENOL) tablet 650 mg (650 mg Oral Given 12/26/19 2215)    ED Course  I have reviewed the triage vital signs and the nursing notes.  Pertinent labs & imaging results that were available during my care of the patient were reviewed by me and considered in my medical decision making (see chart for details).    MDM Rules/Calculators/A&P                          Billy Rasmussen is a 74 y.o. male history of bladder cancer with nephrectomy and urostomy care presenting with possible UTI.  Patient does have a fever 100.9 here.  Patient is also tachycardic and  I think the source of the fever is likely from his your current UTI.  I reviewed his previous urine culture and it showed E. coli and Klebsiella that is resistant to multiple antibiotics but sensitive to Cipro and imipenem. Plan to get CBC, CMP, lactate, cultures, UA, urine culture.  Plan to give IVF and Cipro.  11:15 PM Labs showed white blood cell count of 15. Chemistry showed creatinine 1.5 which is baseline. His urinalysis showed nitrates and leukocytes. He has 6-10 white blood cells. Patient was given ciprofloxacin as his previous urine cultures were sensitive to that. Will discharge home with 10-day course.  Final Clinical Impression(s) / ED Diagnoses Final diagnoses:  None    Rx / DC Orders ED Discharge Orders    None       Drenda Freeze, MD 12/26/19 2316

## 2019-12-26 NOTE — Discharge Instructions (Signed)
Take Cipro twice daily for 10 days   See your doctor   Take Tylenol and ibuprofen for fever.  Return to ER if you have worse abdominal pain, vomiting, fever for a week

## 2019-12-27 ENCOUNTER — Other Ambulatory Visit: Payer: Self-pay

## 2019-12-27 ENCOUNTER — Encounter (HOSPITAL_COMMUNITY): Payer: Self-pay

## 2019-12-27 ENCOUNTER — Telehealth (HOSPITAL_BASED_OUTPATIENT_CLINIC_OR_DEPARTMENT_OTHER): Payer: Self-pay

## 2019-12-27 ENCOUNTER — Inpatient Hospital Stay (HOSPITAL_COMMUNITY)
Admission: EM | Admit: 2019-12-27 | Discharge: 2019-12-29 | DRG: 690 | Disposition: A | Discharge status: Home / Self Care | Payer: Medicare Other | Attending: Family Medicine | Admitting: Family Medicine

## 2019-12-27 ENCOUNTER — Emergency Department (HOSPITAL_COMMUNITY): Payer: Medicare Other

## 2019-12-27 DIAGNOSIS — E7800 Pure hypercholesterolemia, unspecified: Secondary | ICD-10-CM | POA: Diagnosis present

## 2019-12-27 DIAGNOSIS — Z8249 Family history of ischemic heart disease and other diseases of the circulatory system: Secondary | ICD-10-CM

## 2019-12-27 DIAGNOSIS — Z96653 Presence of artificial knee joint, bilateral: Secondary | ICD-10-CM | POA: Diagnosis present

## 2019-12-27 DIAGNOSIS — E669 Obesity, unspecified: Secondary | ICD-10-CM | POA: Diagnosis present

## 2019-12-27 DIAGNOSIS — R509 Fever, unspecified: Secondary | ICD-10-CM | POA: Diagnosis not present

## 2019-12-27 DIAGNOSIS — Z87891 Personal history of nicotine dependence: Secondary | ICD-10-CM

## 2019-12-27 DIAGNOSIS — Z905 Acquired absence of kidney: Secondary | ICD-10-CM

## 2019-12-27 DIAGNOSIS — I517 Cardiomegaly: Secondary | ICD-10-CM | POA: Diagnosis not present

## 2019-12-27 DIAGNOSIS — R7881 Bacteremia: Secondary | ICD-10-CM | POA: Diagnosis not present

## 2019-12-27 DIAGNOSIS — Z87440 Personal history of urinary (tract) infections: Secondary | ICD-10-CM

## 2019-12-27 DIAGNOSIS — Z9079 Acquired absence of other genital organ(s): Secondary | ICD-10-CM

## 2019-12-27 DIAGNOSIS — B961 Klebsiella pneumoniae [K. pneumoniae] as the cause of diseases classified elsewhere: Secondary | ICD-10-CM | POA: Diagnosis present

## 2019-12-27 DIAGNOSIS — Z96611 Presence of right artificial shoulder joint: Secondary | ICD-10-CM | POA: Diagnosis present

## 2019-12-27 DIAGNOSIS — E785 Hyperlipidemia, unspecified: Secondary | ICD-10-CM | POA: Diagnosis present

## 2019-12-27 DIAGNOSIS — C679 Malignant neoplasm of bladder, unspecified: Secondary | ICD-10-CM | POA: Diagnosis present

## 2019-12-27 DIAGNOSIS — R21 Rash and other nonspecific skin eruption: Secondary | ICD-10-CM | POA: Diagnosis not present

## 2019-12-27 DIAGNOSIS — Z79899 Other long term (current) drug therapy: Secondary | ICD-10-CM

## 2019-12-27 DIAGNOSIS — K219 Gastro-esophageal reflux disease without esophagitis: Secondary | ICD-10-CM | POA: Diagnosis present

## 2019-12-27 DIAGNOSIS — I10 Essential (primary) hypertension: Secondary | ICD-10-CM | POA: Diagnosis present

## 2019-12-27 DIAGNOSIS — Z6835 Body mass index (BMI) 35.0-35.9, adult: Secondary | ICD-10-CM

## 2019-12-27 DIAGNOSIS — Z20822 Contact with and (suspected) exposure to covid-19: Secondary | ICD-10-CM | POA: Diagnosis present

## 2019-12-27 DIAGNOSIS — Z833 Family history of diabetes mellitus: Secondary | ICD-10-CM

## 2019-12-27 DIAGNOSIS — M797 Fibromyalgia: Secondary | ICD-10-CM | POA: Diagnosis present

## 2019-12-27 DIAGNOSIS — G4733 Obstructive sleep apnea (adult) (pediatric): Secondary | ICD-10-CM | POA: Diagnosis present

## 2019-12-27 DIAGNOSIS — N179 Acute kidney failure, unspecified: Secondary | ICD-10-CM | POA: Diagnosis present

## 2019-12-27 DIAGNOSIS — D696 Thrombocytopenia, unspecified: Secondary | ICD-10-CM | POA: Diagnosis present

## 2019-12-27 DIAGNOSIS — Z8711 Personal history of peptic ulcer disease: Secondary | ICD-10-CM

## 2019-12-27 DIAGNOSIS — E871 Hypo-osmolality and hyponatremia: Secondary | ICD-10-CM | POA: Diagnosis present

## 2019-12-27 DIAGNOSIS — Z906 Acquired absence of other parts of urinary tract: Secondary | ICD-10-CM

## 2019-12-27 DIAGNOSIS — Z8551 Personal history of malignant neoplasm of bladder: Secondary | ICD-10-CM

## 2019-12-27 DIAGNOSIS — N390 Urinary tract infection, site not specified: Principal | ICD-10-CM | POA: Diagnosis present

## 2019-12-27 LAB — CBC WITH DIFFERENTIAL/PLATELET
Abs Immature Granulocytes: 0.07 10*3/uL (ref 0.00–0.07)
Basophils Absolute: 0 10*3/uL (ref 0.0–0.1)
Basophils Relative: 0 %
Eosinophils Absolute: 0 10*3/uL (ref 0.0–0.5)
Eosinophils Relative: 0 %
HCT: 37.8 % — ABNORMAL LOW (ref 39.0–52.0)
Hemoglobin: 12.5 g/dL — ABNORMAL LOW (ref 13.0–17.0)
Immature Granulocytes: 1 %
Lymphocytes Relative: 9 %
Lymphs Abs: 1.2 10*3/uL (ref 0.7–4.0)
MCH: 30.8 pg (ref 26.0–34.0)
MCHC: 33.1 g/dL (ref 30.0–36.0)
MCV: 93.1 fL (ref 80.0–100.0)
Monocytes Absolute: 1.6 10*3/uL — ABNORMAL HIGH (ref 0.1–1.0)
Monocytes Relative: 12 %
Neutro Abs: 11.1 10*3/uL — ABNORMAL HIGH (ref 1.7–7.7)
Neutrophils Relative %: 78 %
Platelets: 125 10*3/uL — ABNORMAL LOW (ref 150–400)
RBC: 4.06 MIL/uL — ABNORMAL LOW (ref 4.22–5.81)
RDW: 14.6 % (ref 11.5–15.5)
WBC: 14 10*3/uL — ABNORMAL HIGH (ref 4.0–10.5)
nRBC: 0 % (ref 0.0–0.2)

## 2019-12-27 LAB — COMPREHENSIVE METABOLIC PANEL
ALT: 25 U/L (ref 0–44)
AST: 28 U/L (ref 15–41)
Albumin: 4.1 g/dL (ref 3.5–5.0)
Alkaline Phosphatase: 56 U/L (ref 38–126)
Anion gap: 13 (ref 5–15)
BUN: 26 mg/dL — ABNORMAL HIGH (ref 8–23)
CO2: 23 mmol/L (ref 22–32)
Calcium: 10.1 mg/dL (ref 8.9–10.3)
Chloride: 97 mmol/L — ABNORMAL LOW (ref 98–111)
Creatinine, Ser: 1.79 mg/dL — ABNORMAL HIGH (ref 0.61–1.24)
GFR calc Af Amer: 43 mL/min — ABNORMAL LOW (ref >60)
GFR calc non Af Amer: 37 mL/min — ABNORMAL LOW (ref >60)
Glucose, Bld: 109 mg/dL — ABNORMAL HIGH (ref 70–99)
Potassium: 4.7 mmol/L (ref 3.5–5.1)
Sodium: 133 mmol/L — ABNORMAL LOW (ref 135–145)
Total Bilirubin: 1.5 mg/dL — ABNORMAL HIGH (ref 0.3–1.2)
Total Protein: 8.2 g/dL — ABNORMAL HIGH (ref 6.5–8.1)

## 2019-12-27 LAB — BLOOD CULTURE ID PANEL (REFLEXED)

## 2019-12-27 LAB — PROTIME-INR
INR: 1.4 — ABNORMAL HIGH (ref 0.8–1.2)
Prothrombin Time: 16.2 seconds — ABNORMAL HIGH (ref 11.4–15.2)

## 2019-12-27 LAB — SARS CORONAVIRUS 2 BY RT PCR (HOSPITAL ORDER, PERFORMED IN ~~LOC~~ HOSPITAL LAB): SARS Coronavirus 2: NEGATIVE

## 2019-12-27 LAB — LACTIC ACID, PLASMA: Lactic Acid, Venous: 1.2 mmol/L (ref 0.5–1.9)

## 2019-12-27 LAB — APTT: aPTT: 39 seconds — ABNORMAL HIGH (ref 24–36)

## 2019-12-27 MED ORDER — SODIUM CHLORIDE 0.9 % IV SOLN
1.0000 g | INTRAVENOUS | Status: AC
  Administered 2019-12-27: 1 g via INTRAVENOUS
  Filled 2019-12-27: qty 1

## 2019-12-27 MED ORDER — SODIUM CHLORIDE 0.9 % IV BOLUS
1000.0000 mL | Freq: Once | INTRAVENOUS | Status: AC
  Administered 2019-12-27: 1000 mL via INTRAVENOUS

## 2019-12-27 NOTE — Telephone Encounter (Signed)
This RN was notified by microbiology that pt had gram negative rods for Klebsiella pneumoniae. Darl Householder MD was made aware and advised for pt to return to [facility] ED to be admitted. This RN called and spoke with patient on the phone, Pt advises he will go to [facility] ED for admission and has no further questions at this time.

## 2019-12-27 NOTE — ED Triage Notes (Signed)
Pt was seen yesterday and discharged with antibiotics, he was called back today because his cultures came back positive and he needed to come in for [facility]

## 2019-12-27 NOTE — ED Notes (Signed)
Ralene Bathe, MD attempted x 2 to obtain blood work. Unable to obtain any labs. Will contact phlebotomy and start antibiotics.

## 2019-12-27 NOTE — ED Notes (Signed)
This RN attempted x 2 to establish IV access unsuccessfully. Charge RN made aware and IV team consult placed.

## 2019-12-27 NOTE — Progress Notes (Signed)
A consult was received from an ED physician for Meropenem per pharmacy dosing.  The patient's profile has been reviewed for ht/wt/allergies/indication/available labs.    A one time order has been placed for Meropenem 1g IV.  Further antibiotics/pharmacy consults should be ordered by admitting physician if indicated.                       Thank you, Luiz Ochoa 12/27/2019  9:01 PM

## 2019-12-27 NOTE — ED Notes (Signed)
Two attempts unsuccessful order for IV team requested

## 2019-12-27 NOTE — ED Provider Notes (Signed)
[place] DEPT Provider Note   CSN: [id number] Arrival date & time: 12/27/19  2030     History No chief complaint on file.   Billy Rasmussen is a 74 y.o. male.  The history is provided by the patient and medical records. The history is limited by the condition of the patient. No language interpreter was used.   Billy Rasmussen is a 74 y.o. male who presents to the Emergency Department complaining of abnormal lab and fever. He presents the emergency department for repeat evaluation after receiving a call that his blood culture came back positive. He was seen in the emergency department yesterday for fevers and malaise that started on Monday. He has fevers, decreased urinary output and feels lethargic. He has been taking his antibiotic as directed since ED discharge. His last dose of Cipro was at noon today. Denies any vomiting, diarrhea, shortness of breath, chest pain. Symptoms are moderate and constant.  No known sick contacts.    Past Medical History:  Diagnosis Date  . Arthritis    knees, shoulders, elbows  . Bladder cancer [facility])    urologist-  dr Junious Silk  . Diverticulosis of colon   . Fibromyalgia   . GERD (gastroesophageal reflux disease)   . History of diverticulitis of colon   . History of gastric ulcer    due to aleve  . Hypertension   . Lower urinary tract symptoms (LUTS)   . OSA (obstructive sleep apnea)    NON-COMPLIANT  CPAP  --- BUT PT USES OXYGEN AT NIGHT 2.5L VIA [place] (PT'S DECISION)  . PONV (postoperative nausea and vomiting)   . Psoriasis   . Sepsis [facility])    january 2021  . Tinnitus    right ear more, has tranmitter in right ear removable at hs  . Wears glasses   . Wears partial dentures     Patient Active Problem List   Diagnosis Date Noted  . Renal mass 07/15/2019  . Nonfunctioning kidney 07/14/2019  . UTI (urinary tract infection) 06/25/2019  . OSA (obstructive sleep apnea) 05/12/2019  . Fever 04/09/2019  . SIRS  (systemic inflammatory response syndrome) ([place]) 04/09/2019  . Sepsis due to urinary tract infection ([place]) 02/17/2019  . Abnormal renal function 01/20/2019  . Enterococcus UTI   . Urinary tract infection without hematuria   . Acute pyelonephritis   . Gastroesophageal reflux disease   . Sepsis ([place]) 12/22/2018  . AKI (acute kidney injury) ([place]) 12/22/2018  . Bladder cancer ([place]) 11/09/2018  . 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 Acute cystitis with hematuria 10/02/2016  . Pure hypercholesterolemia 10/02/2016  . Malignant neoplasm of urinary bladder ([place]) 10/02/2016  . Lipoma 09/30/2016  . Obese 03/31/2016  . S/P right TKA 03/30/2016  . S/P knee replacement 03/30/2016  . HTN (hypertension) 02/25/2016  . Healthcare maintenance 02/25/2016  . Obesity (BMI 30-39.9) 01/07/2016  . S/P shoulder replacement 07/20/2013  . Shoulder arthritis 04/07/2013  . CELLULITIS, KNEE, LEFT 08/26/2006    Past Surgical History:  Procedure Laterality Date  . APPENDECTOMY    . COLECTOMY W/ COLOSTOMY  1996   W/   APPENDECTOMY  . COLONOSCOPY N/A 05/11/2018   Procedure: COLONOSCOPY;  Surgeon: Daneil Dolin, MD;  [facility];  Service: Endoscopy;  Laterality: N/A;  9:30  . COLOSTOMY TAKEDOWN  1996  . CYSTOSCOPY WITH BIOPSY N/A 12/05/2013   Procedure: CYSTO BLADDER BIOPSY AND FULGERATION;  Surgeon: Festus Aloe, MD;  Location: [facility];  Service:  Urology;  Laterality: N/A;  . CYSTOSCOPY WITH BIOPSY Bilateral 11/13/2014   Procedure: CYSTOSCOPY WITH  BLADDER BIOPSY FULGERATION AND BILATERAL RETROGRADE PYELOGRAMS;  Surgeon: Festus Aloe, MD;  Location: [facility];  Service: Urology;  Laterality: Bilateral;  . CYSTOSCOPY WITH FULGERATION N/A 01/18/2018   Procedure: CYSTOSCOPY WITH FULGERATION/ BLADDER BIOPSY;  Surgeon: Festus Aloe, MD;  Location: [facility];  Service: Urology;  Laterality: N/A;  . CYSTOSCOPY WITH INJECTION N/A 11/09/2018   Procedure:  CYSTOSCOPY WITH INJECTION OF INDOCYANINE GREEN DYE;  Surgeon: Alexis Frock, MD;  Location[facility];  Service: Urology;  Laterality: N/A;  . CYSTOSCOPY WITH INSERTION OF UROLIFT N/A 01/18/2018   Procedure: CYSTOSCOPY WITH INSERTION OF UROLIFT;  Surgeon: Festus Aloe, MD;  Location: [facility];  Service: Urology;  Laterality: N/A;  . CYSTOSCOPY/URETEROSCOPY/HOLMIUM LASER Left 02/17/2019   Procedure: URETEROSCOPY WITH BALLOON DILATION  AND NEPHROSTOGRAM;  Surgeon: Alexis Frock, MD;  Location: [facility];  Service: Urology;  Laterality: Left;  1 HR  . EXCISION RIGHT UPPER ARM LIPOMA  2005  . HEMORROIDECTOMY    . INGUINAL HERNIA REPAIR Left 1984  . IR NEPHROSTOMY PLACEMENT LEFT  01/05/2019  . KNEE ARTHROSCOPY Left X3  LAST ONE  2002  . ORIF LEFT HUMEROUS FX  1976  . POLYPECTOMY  05/11/2018   Procedure: POLYPECTOMY;  Surgeon: Daneil Dolin, MD;  [facility];  Service: Endoscopy;;  . PROSTATE SURGERY    . ROBOT ASSISTED LAPAROSCOPIC NEPHRECTOMY Left 07/14/2019   Procedure: XI ROBOTIC ASSISTED LAPAROSCOPIC RETROPERITONEAL NEPHRECTOMY;  Surgeon: Alexis Frock, MD;  Location[facility];  Service: Urology;  Laterality: Left;  3 HRS  . TONSILLECTOMY  AS CHILD  . TOTAL KNEE ARTHROPLASTY Left 2006   REVISION 2007  (AFTER I & D WITH ANTIBIOTIC SPACER PROCEDURE FOR STEPH INFECTION)  . TOTAL KNEE ARTHROPLASTY Right 03/30/2016   Procedure: RIGHT TOTAL KNEE ARTHROPLASTY;  Surgeon: Paralee Cancel, MD;  Location: [facility];  Service: Orthopedics;  Laterality: Right;  . TOTAL SHOULDER ARTHROPLASTY Right 04/06/2013   Procedure: RIGHT TOTAL SHOULDER ARTHROPLASTY;  Surgeon: Marin Shutter, MD;  Location: [place];  Service: Orthopedics;  Laterality: Right;  . TOTAL SHOULDER ARTHROPLASTY Left 07/20/2013   Procedure: LEFT TOTAL SHOULDER ARTHROPLASTY;  Surgeon: Marin Shutter, MD;  Location: [place];  Service: Orthopedics;  Laterality: Left;  . TRANSURETHRAL RESECTION OF BLADDER  TUMOR N/A 11/12/2015   Procedure: TRANSURETHRAL RESECTION OF BLADDER TUMOR (TURBT);  Surgeon: Festus Aloe, MD;  Location: [facility];  Service: Urology;  Laterality: N/A;  . TRANSURETHRAL RESECTION OF BLADDER TUMOR WITH GYRUS (TURBT-GYRUS) N/A 12/05/2013   Procedure: TRANSURETHRAL RESECTION OF BLADDER TUMOR WITH GYRUS (TURBT-GYRUS);  Surgeon: Festus Aloe, MD;  Location: [facility];  Service: Urology;  Laterality: N/A;       Family History  Problem Relation Age of Onset  . Alzheimer's disease Mother   . Heart attack Father   . Heart disease Father   . Irregular heart beat Brother        DEFIB.  . Congestive Heart Failure Brother   . Diabetes Daughter     Social History   Tobacco Use  . Smoking status: Former Smoker    Packs/day: 2.00    Years: 40.00    Pack years: 80.00    Types: Cigarettes    Quit date: 06/01/1997    Years since quitting: 22.5  . Smokeless tobacco: Never Used  Vaping Use  .  Vaping Use: Never used  Substance Use Topics  . Alcohol use: No  . Drug use: No    Home Medications Prior to Admission medications   Medication Sig Start Date End Date Taking? Authorizing Provider  amLODipine (NORVASC) 10 MG tablet Take 1 tablet (10 mg total) by mouth daily. 08/21/19 08/20/20 Yes Gottschalk, Leatrice Jewels M, DO  ciprofloxacin (CIPRO) 500 MG tablet Take 1 tablet (500 mg total) by mouth every 12 (twelve) hours. 12/26/19  Yes Drenda Freeze, MD  lisinopril (ZESTRIL) 5 MG tablet Take 1 tablet (5 mg total) by mouth daily. 11/14/19  Yes Ronnie Doss M, DO  Multiple Vitamin (MULTIVITAMIN ADULT) TABS Take 1 tablet by mouth daily.   Yes [provider]  OVER THE COUNTER MEDICATION Take 1 tablet by mouth daily.   Yes [provider]  amoxicillin-clavulanate (AUGMENTIN) 875-125 MG tablet SMARTSIG:1 Tablet(s) By Mouth Every 12 Hours Patient not taking: Reported on 12/27/2019 11/21/19   [provider]  atorvastatin  (LIPITOR) 20 MG tablet Take 1 tablet (20 mg total) by mouth daily. 09/11/19 12/10/19  Troy Sine, MD  docusate sodium (COLACE) 100 MG capsule Take 200 mg by mouth daily. Patient not taking: Reported on 12/27/2019    [provider]  ferrous sulfate 325 (65 FE) MG tablet Take 1 tablet (325 mg total) by mouth daily with breakfast. Patient not taking: Reported on 08/21/2019 05/16/19   Vicenta Dunning, MD  polyvinyl alcohol (LIQUIFILM TEARS) 1.4 % ophthalmic solution Place 1 drop into both eyes as needed for dry eyes. Patient not taking: Reported on 12/27/2019 05/16/19   Vicenta Dunning, MD    Allergies    Patient has no known allergies.  Review of Systems   Review of Systems  All other systems reviewed and are negative.   Physical Exam Updated Vital Signs BP 102/67 (BP Location: Right Arm)   Pulse 102   Temp 98.2 F (36.8 C) (Oral)   Resp 14   Ht 5\' 6"  (1.676 m)   Wt (!) 97.5 kg   SpO2 99%   BMI 34.70 kg/m   Physical Exam Vitals and nursing note reviewed.  Constitutional:      Appearance: He is well-developed.  HENT:     Head: Normocephalic and atraumatic.  Cardiovascular:     Rate and Rhythm: Normal rate and regular rhythm.     Heart sounds: No murmur heard.   Pulmonary:     Effort: Pulmonary effort is normal. No respiratory distress.     Breath sounds: Normal breath sounds.  Abdominal:     Palpations: Abdomen is soft.     Tenderness: There is no abdominal tenderness. There is no guarding or rebound.     Comments: Urostomy pouch in the right lower quadrant with yellow urine in the bag  Musculoskeletal:        General: No swelling or tenderness.  Skin:    General: Skin is warm and dry.  Neurological:     Mental Status: He is alert and oriented to person, place, and time.  Psychiatric:        Behavior: Behavior normal.     ED Results / Procedures / Treatments   Labs (all labs ordered are listed, but only abnormal results are displayed) Labs Reviewed    SARS CORONAVIRUS 2 BY RT PCR (HOSPITAL ORDER, [place] [facility])  CULTURE, BLOOD (ROUTINE X 2)  CULTURE, BLOOD (ROUTINE X 2)  URINE CULTURE  LACTIC ACID, PLASMA  COMPREHENSIVE  METABOLIC PANEL  CBC WITH DIFFERENTIAL/PLATELET  APTT  PROTIME-INR  URINALYSIS, ROUTINE W REFLEX MICROSCOPIC    EKG EKG Interpretation  Date/Time:  Wednesday December 27 2019 21:08:15 EDT Ventricular Rate:  96 PR Interval:    QRS Duration: 117 QT Interval:  361 QTC Calculation: 457 R Axis:   -57 Text Interpretation: Sinus rhythm Prolonged PR interval Left anterior fascicular block Consider anterior infarct Nonspecific T abnormalities, lateral leads Confirmed by Quintella Reichert [phone]) on 12/27/2019 9:20:23 PM   Radiology DG Chest 2 View  Result Date: 12/26/2019 CLINICAL DATA:  74 year old male with sepsis EXAM: CHEST - 2 VIEW COMPARISON:  Chest radiograph dated 10/31/2019. FINDINGS: There is no focal consolidation, pleural effusion, or pneumothorax. Mild cardiomegaly. No acute osseous pathology. Bilateral shoulder arthroplasties. IMPRESSION: No active cardiopulmonary disease. Electronically Signed   By: Anner Crete M.D.   On: 12/26/2019 19:35   DG Chest Port 1 View  Result Date: 12/27/2019 CLINICAL DATA:  74 year old male with fever. EXAM: PORTABLE CHEST 1 VIEW COMPARISON:  Chest radiograph dated 12/26/2019. FINDINGS: No focal consolidation, pleural effusion, pneumothorax. Mild cardiomegaly. No acute osseous pathology. Bilateral shoulder hemi arthroplasties. IMPRESSION: No active cardiopulmonary disease. Electronically Signed   By: Anner Crete M.D.   On: 12/27/2019 21:22    Procedures Procedures (including critical care time)  Medications Ordered in ED Medications  meropenem (MERREM) 1 g in sodium chloride 0.9 % 100 mL IVPB (0 g Intravenous Stopped 12/27/19 2313)  sodium chloride 0.9 % bolus 1,000 mL (1,000 mLs Intravenous New Bag/Given 12/27/19 2304)    ED Course  I have  reviewed the triage vital signs and the nursing notes.  Pertinent labs & imaging results that were available during my care of the patient were reviewed by me and considered in my medical decision making (see chart for details).    MDM Rules/Calculators/A&P                         Patient here for evaluation due to abnormal blood culture. He is non-toxic appearing on evaluation. Discussed blood culture findings with pharmacist, will start meropenem for possible ESBL. Plan to admit for further treatment.  Final Clinical Impression(s) / ED Diagnoses Final diagnoses:  None    Rx / DC Orders ED Discharge Orders    None       Quintella Reichert, MD 12/27/19 2338

## 2019-12-27 NOTE — ED Notes (Signed)
IV team unable to obtain blood work, Ralene Bathe MD made aware.

## 2019-12-28 ENCOUNTER — Other Ambulatory Visit: Payer: Self-pay

## 2019-12-28 DIAGNOSIS — Z79899 Other long term (current) drug therapy: Secondary | ICD-10-CM | POA: Diagnosis not present

## 2019-12-28 DIAGNOSIS — E871 Hypo-osmolality and hyponatremia: Secondary | ICD-10-CM | POA: Diagnosis present

## 2019-12-28 DIAGNOSIS — Z20822 Contact with and (suspected) exposure to covid-19: Secondary | ICD-10-CM | POA: Diagnosis present

## 2019-12-28 DIAGNOSIS — D696 Thrombocytopenia, unspecified: Secondary | ICD-10-CM | POA: Diagnosis present

## 2019-12-28 DIAGNOSIS — N179 Acute kidney failure, unspecified: Secondary | ICD-10-CM

## 2019-12-28 DIAGNOSIS — C679 Malignant neoplasm of bladder, unspecified: Secondary | ICD-10-CM

## 2019-12-28 DIAGNOSIS — N3000 Acute cystitis without hematuria: Secondary | ICD-10-CM

## 2019-12-28 DIAGNOSIS — Z6835 Body mass index (BMI) 35.0-35.9, adult: Secondary | ICD-10-CM | POA: Diagnosis not present

## 2019-12-28 DIAGNOSIS — Z9079 Acquired absence of other genital organ(s): Secondary | ICD-10-CM | POA: Diagnosis not present

## 2019-12-28 DIAGNOSIS — B961 Klebsiella pneumoniae [K. pneumoniae] as the cause of diseases classified elsewhere: Secondary | ICD-10-CM | POA: Diagnosis present

## 2019-12-28 DIAGNOSIS — I10 Essential (primary) hypertension: Secondary | ICD-10-CM | POA: Diagnosis not present

## 2019-12-28 DIAGNOSIS — R7881 Bacteremia: Secondary | ICD-10-CM | POA: Diagnosis not present

## 2019-12-28 DIAGNOSIS — Z96611 Presence of right artificial shoulder joint: Secondary | ICD-10-CM | POA: Diagnosis present

## 2019-12-28 DIAGNOSIS — Z87440 Personal history of urinary (tract) infections: Secondary | ICD-10-CM | POA: Diagnosis not present

## 2019-12-28 DIAGNOSIS — N390 Urinary tract infection, site not specified: Principal | ICD-10-CM

## 2019-12-28 DIAGNOSIS — E669 Obesity, unspecified: Secondary | ICD-10-CM | POA: Diagnosis not present

## 2019-12-28 DIAGNOSIS — Z8711 Personal history of peptic ulcer disease: Secondary | ICD-10-CM | POA: Diagnosis not present

## 2019-12-28 DIAGNOSIS — G4733 Obstructive sleep apnea (adult) (pediatric): Secondary | ICD-10-CM

## 2019-12-28 DIAGNOSIS — M797 Fibromyalgia: Secondary | ICD-10-CM | POA: Diagnosis present

## 2019-12-28 DIAGNOSIS — Z8551 Personal history of malignant neoplasm of bladder: Secondary | ICD-10-CM | POA: Diagnosis not present

## 2019-12-28 DIAGNOSIS — E785 Hyperlipidemia, unspecified: Secondary | ICD-10-CM | POA: Diagnosis present

## 2019-12-28 DIAGNOSIS — E7800 Pure hypercholesterolemia, unspecified: Secondary | ICD-10-CM | POA: Diagnosis present

## 2019-12-28 DIAGNOSIS — Z905 Acquired absence of kidney: Secondary | ICD-10-CM | POA: Diagnosis not present

## 2019-12-28 DIAGNOSIS — K219 Gastro-esophageal reflux disease without esophagitis: Secondary | ICD-10-CM | POA: Diagnosis present

## 2019-12-28 DIAGNOSIS — Z87891 Personal history of nicotine dependence: Secondary | ICD-10-CM | POA: Diagnosis not present

## 2019-12-28 DIAGNOSIS — Z906 Acquired absence of other parts of urinary tract: Secondary | ICD-10-CM

## 2019-12-28 DIAGNOSIS — Z96653 Presence of artificial knee joint, bilateral: Secondary | ICD-10-CM | POA: Diagnosis present

## 2019-12-28 LAB — URINALYSIS, ROUTINE W REFLEX MICROSCOPIC
Bacteria, UA: NONE SEEN
Bilirubin Urine: NEGATIVE
Glucose, UA: NEGATIVE mg/dL
Ketones, ur: 5 mg/dL — AB
Nitrite: NEGATIVE
Protein, ur: 30 mg/dL — AB
Specific Gravity, Urine: 1.011 (ref 1.005–1.030)
pH: 6 (ref 5.0–8.0)

## 2019-12-28 LAB — CBC
HCT: 34 % — ABNORMAL LOW (ref 39.0–52.0)
Hemoglobin: 11.3 g/dL — ABNORMAL LOW (ref 13.0–17.0)
MCH: 30.5 pg (ref 26.0–34.0)
MCHC: 33.2 g/dL (ref 30.0–36.0)
MCV: 91.9 fL (ref 80.0–100.0)
Platelets: 128 10*3/uL — ABNORMAL LOW (ref 150–400)
RBC: 3.7 MIL/uL — ABNORMAL LOW (ref 4.22–5.81)
RDW: 14.6 % (ref 11.5–15.5)
WBC: 10.5 10*3/uL (ref 4.0–10.5)
nRBC: 0 % (ref 0.0–0.2)

## 2019-12-28 LAB — COMPREHENSIVE METABOLIC PANEL
ALT: 21 U/L (ref 0–44)
AST: 25 U/L (ref 15–41)
Albumin: 3.7 g/dL (ref 3.5–5.0)
Alkaline Phosphatase: 50 U/L (ref 38–126)
Anion gap: 10 (ref 5–15)
BUN: 27 mg/dL — ABNORMAL HIGH (ref 8–23)
CO2: 21 mmol/L — ABNORMAL LOW (ref 22–32)
Calcium: 9.6 mg/dL (ref 8.9–10.3)
Chloride: 102 mmol/L (ref 98–111)
Creatinine, Ser: 1.52 mg/dL — ABNORMAL HIGH (ref 0.61–1.24)
GFR calc Af Amer: 52 mL/min — ABNORMAL LOW (ref >60)
GFR calc non Af Amer: 45 mL/min — ABNORMAL LOW (ref >60)
Glucose, Bld: 108 mg/dL — ABNORMAL HIGH (ref 70–99)
Potassium: 3.9 mmol/L (ref 3.5–5.1)
Sodium: 133 mmol/L — ABNORMAL LOW (ref 135–145)
Total Bilirubin: 0.9 mg/dL (ref 0.3–1.2)
Total Protein: 7.4 g/dL (ref 6.5–8.1)

## 2019-12-28 MED ORDER — ACETAMINOPHEN 325 MG PO TABS
650.0000 mg | ORAL_TABLET | Freq: Four times a day (QID) | ORAL | Status: DC | PRN
  Administered 2019-12-28: 650 mg via ORAL
  Filled 2019-12-28: qty 2

## 2019-12-28 MED ORDER — AMLODIPINE BESYLATE 10 MG PO TABS
10.0000 mg | ORAL_TABLET | Freq: Every day | ORAL | Status: DC
  Administered 2019-12-28 – 2019-12-29 (×2): 10 mg via ORAL
  Filled 2019-12-28 (×2): qty 1

## 2019-12-28 MED ORDER — ATORVASTATIN CALCIUM 20 MG PO TABS
20.0000 mg | ORAL_TABLET | Freq: Every day | ORAL | Status: DC
  Filled 2019-12-28: qty 1

## 2019-12-28 MED ORDER — SODIUM CHLORIDE 0.9 % IV SOLN
1.0000 g | Freq: Two times a day (BID) | INTRAVENOUS | Status: DC
  Administered 2019-12-28 (×2): 1 g via INTRAVENOUS
  Filled 2019-12-28 (×3): qty 1

## 2019-12-28 MED ORDER — ENOXAPARIN SODIUM 40 MG/0.4ML ~~LOC~~ SOLN
40.0000 mg | SUBCUTANEOUS | Status: DC
  Administered 2019-12-28 – 2019-12-29 (×2): 40 mg via SUBCUTANEOUS
  Filled 2019-12-28 (×2): qty 0.4

## 2019-12-28 NOTE — Progress Notes (Signed)
Pharmacy Antibiotic Note  Billy Rasmussen is a 74 y.o. male admitted on 12/27/2019 with K. Pneumoniae bacteremia.  H/O ESBL.  Pharmacy has been consulted for Meropenem dosing.  Plan: Meropenem 1gm IV q12h F/u cultures sensitivities  Height: 5\' 6"  (167.6 cm) Weight: (!) 97.5 kg (215 lb) IBW/kg (Calculated) : 63.8  Temp (24hrs), Avg:98.2 F (36.8 C), Min:98.2 F (36.8 C), Max:98.2 F (36.8 C)  Recent Labs  Lab 12/26/19 1948 12/26/19 2216 12/27/19 2205  WBC 15.9*  --  14.0*  CREATININE 1.54*  --  1.79*  LATICACIDVEN 1.2 1.1 1.2    Estimated Creatinine Clearance: 40.8 mL/min (A) (by C-G formula based on SCr of 1.79 mg/dL (H)).    No Known Allergies  Antimicrobials this admission: 7/28 Meropenem >>     Dose adjustments this admission:   Microbiology results: 7/27 BCx: K. pneumoniae 7/27 UCx: in process   Thank you for allowing pharmacy to be a part of this patient's care.  Everette Rank, PharmD 12/28/2019 12:37 AM

## 2019-12-28 NOTE — H&P (Signed)
History and Physical    Billy Rasmussen TML:[id number] DOB: [birth year] DOA: 12/27/2019  PCP: Janora Norlander, DO  Patient coming from: Home  I have personally briefly reviewed patient's old medical records in [place]  Chief Complaint: Bacteremia  HPI: Billy Rasmussen is a 74 y.o. male with medical history significant for bladder cancer s/p urostomy, prostatectomy, nephrectomy, OSA on nocturnal O2, hypertension and GERD who presents due to bacteremia.  Patient was evaluated in the ED yesterday and was found to to be febrile with a UTI.  He was discharged home with Cipro which he has been compliant with.  However his blood culture later grew gram-negative rods and Klebsiella and he was called back to present to the ED. His symptoms of feeling "feverish" and decreased appetite started 2 days ago.  He also noticed more cloudiness of his urine inside his urostomy bag.  No nausea or vomiting, or diarrhea. patient has history of recurrent UTIs and has history of Klebsiella and Serratia and ESBL in the past.  ED Course: He was afebrile and normotensive on room air. Had leukocytosis of 14.  New thrombocytopenia of 125.  Mild hyponatremia 133, creatinine elevated to 1.75 from a prior of 1.5.  Mildly elevated total bilirubin of 1.5 but normal LFTs.  Review of Systems:  Constitutional: No Weight Change, + Fever ENT/Mouth: No sore throat, No Rhinorrhea Eyes: No Eye Pain, No Vision Changes Cardiovascular: No Chest Pain, no SOB Respiratory: No Cough, No Sputum,  Gastrointestinal: No Nausea, No Vomiting, No Diarrhea, No Constipation, No Pain Genitourinary: no Urinary Incontinence, Musculoskeletal: No Arthralgias, No Myalgias Skin: No Skin Lesions, No Pruritus, Neuro: no Weakness, No Numbness Psych: No Anxiety/Panic, No Depression, +decrease appetite Heme/Lymph: No Bruising, No Bleeding  Past Medical History:  Diagnosis Date  . Arthritis    knees, shoulders, elbows  . Bladder cancer  [facility])    urologist-  dr Junious Silk  . Diverticulosis of colon   . Fibromyalgia   . GERD (gastroesophageal reflux disease)   . History of diverticulitis of colon   . History of gastric ulcer    due to aleve  . Hypertension   . Lower urinary tract symptoms (LUTS)   . OSA (obstructive sleep apnea)    NON-COMPLIANT  CPAP  --- BUT PT USES OXYGEN AT NIGHT 2.5L VIA [place] (PT'S DECISION)  . PONV (postoperative nausea and vomiting)   . Psoriasis   . Sepsis [facility]    january 2021  . Tinnitus    right ear more, has tranmitter in right ear removable at hs  . Wears glasses   . Wears partial dentures     Past Surgical History:  Procedure Laterality Date  . APPENDECTOMY    . COLECTOMY W/ COLOSTOMY  1996   W/   APPENDECTOMY  . COLONOSCOPY N/A 05/11/2018   Procedure: COLONOSCOPY;  Surgeon: Daneil Dolin, MD;  [facility];  Service: Endoscopy;  Laterality: N/A;  9:30  . COLOSTOMY TAKEDOWN  1996  . CYSTOSCOPY WITH BIOPSY N/A 12/05/2013   Procedure: CYSTO BLADDER BIOPSY AND FULGERATION;  Surgeon: Festus Aloe, MD;  Location: [facility];  Service: Urology;  Laterality: N/A;  . CYSTOSCOPY WITH BIOPSY Bilateral 11/13/2014   Procedure: CYSTOSCOPY WITH  BLADDER BIOPSY FULGERATION AND BILATERAL RETROGRADE PYELOGRAMS;  Surgeon: Festus Aloe, MD;  Location: [facility];  Service: Urology;  Laterality: Bilateral;  . CYSTOSCOPY WITH FULGERATION N/A 01/18/2018   Procedure: CYSTOSCOPY WITH FULGERATION/ BLADDER BIOPSY;  Surgeon:  Festus Aloe, MD;  Location[facility];  Service: Urology;  Laterality: N/A;  . CYSTOSCOPY WITH INJECTION N/A 11/09/2018   Procedure: CYSTOSCOPY WITH INJECTION OF INDOCYANINE GREEN DYE;  Surgeon: Alexis Frock, MD;  Location: [facility];  Service: Urology;  Laterality: N/A;  . CYSTOSCOPY WITH INSERTION OF UROLIFT N/A 01/18/2018   Procedure: CYSTOSCOPY WITH INSERTION OF UROLIFT;  Surgeon: Festus Aloe, MD;  Location:  [facility];  Service: Urology;  Laterality: N/A;  . CYSTOSCOPY/URETEROSCOPY/HOLMIUM LASER Left 02/17/2019   Procedure: URETEROSCOPY WITH BALLOON DILATION  AND NEPHROSTOGRAM;  Surgeon: Alexis Frock, MD;  Location: [facility];  Service: Urology;  Laterality: Left;  1 HR  . EXCISION RIGHT UPPER ARM LIPOMA  2005  . HEMORROIDECTOMY    . INGUINAL HERNIA REPAIR Left 1984  . IR NEPHROSTOMY PLACEMENT LEFT  01/05/2019  . KNEE ARTHROSCOPY Left X3  LAST ONE  2002  . ORIF LEFT HUMEROUS FX  1976  . POLYPECTOMY  05/11/2018   Procedure: POLYPECTOMY;  Surgeon: Daneil Dolin, MD;  [facility];  Service: Endoscopy;;  . PROSTATE SURGERY    . ROBOT ASSISTED LAPAROSCOPIC NEPHRECTOMY Left 07/14/2019   Procedure: XI ROBOTIC ASSISTED LAPAROSCOPIC RETROPERITONEAL NEPHRECTOMY;  Surgeon: Alexis Frock, MD;  Location: [facility];  Service: Urology;  Laterality: Left;  3 HRS  . TONSILLECTOMY  AS CHILD  . TOTAL KNEE ARTHROPLASTY Left 2006   REVISION 2007  (AFTER I & D WITH ANTIBIOTIC SPACER PROCEDURE FOR STEPH INFECTION)  . TOTAL KNEE ARTHROPLASTY Right 03/30/2016   Procedure: RIGHT TOTAL KNEE ARTHROPLASTY;  Surgeon: Paralee Cancel, MD;  Location: [facility];  Service: Orthopedics;  Laterality: Right;  . TOTAL SHOULDER ARTHROPLASTY Right 04/06/2013   Procedure: RIGHT TOTAL SHOULDER ARTHROPLASTY;  Surgeon: Marin Shutter, MD;  Location: [place];  Service: Orthopedics;  Laterality: Right;  . TOTAL SHOULDER ARTHROPLASTY Left 07/20/2013   Procedure: LEFT TOTAL SHOULDER ARTHROPLASTY;  Surgeon: Marin Shutter, MD;  Location: [place];  Service: Orthopedics;  Laterality: Left;  . TRANSURETHRAL RESECTION OF BLADDER TUMOR N/A 11/12/2015   Procedure: TRANSURETHRAL RESECTION OF BLADDER TUMOR (TURBT);  Surgeon: Festus Aloe, MD;  Location: [facility];  Service: Urology;  Laterality: N/A;  . TRANSURETHRAL RESECTION OF BLADDER TUMOR WITH GYRUS (TURBT-GYRUS) N/A 12/05/2013   Procedure:  TRANSURETHRAL RESECTION OF BLADDER TUMOR WITH GYRUS (TURBT-GYRUS);  Surgeon: Festus Aloe, MD;  Location: [facility];  Service: Urology;  Laterality: N/A;     reports that he quit smoking about 22 years ago. His smoking use included cigarettes. He has a 80.00 pack-year smoking history. He has never used smokeless tobacco. He reports that he does not drink alcohol and does not use drugs. Social History  No Known Allergies  Family History  Problem Relation Age of Onset  . Alzheimer's disease Mother   . Heart attack Father   . Heart disease Father   . Irregular heart beat Brother        DEFIB.  . Congestive Heart Failure Brother   . Diabetes Daughter      Prior to Admission medications   Medication Sig Start Date End Date Taking? Authorizing Provider  amLODipine (NORVASC) 10 MG tablet Take 1 tablet (10 mg total) by mouth daily. 08/21/19 08/20/20 Yes Gottschalk, Leatrice Jewels M, DO  ciprofloxacin (CIPRO) 500 MG tablet Take 1 tablet (500 mg total) by mouth every 12 (twelve) hours. 12/26/19  Yes Drenda Freeze, MD  lisinopril (ZESTRIL) 5 MG tablet Take  1 tablet (5 mg total) by mouth daily. 11/14/19  Yes Ronnie Doss M, DO  Multiple Vitamin (MULTIVITAMIN ADULT) TABS Take 1 tablet by mouth daily.   Yes [provider]  OVER THE COUNTER MEDICATION Take 1 tablet by mouth daily.   Yes [provider]  amoxicillin-clavulanate (AUGMENTIN) 875-125 MG tablet SMARTSIG:1 Tablet(s) By Mouth Every 12 Hours Patient not taking: Reported on 12/27/2019 11/21/19   [provider]  atorvastatin (LIPITOR) 20 MG tablet Take 1 tablet (20 mg total) by mouth daily. 09/11/19 12/10/19  Troy Sine, MD  docusate sodium (COLACE) 100 MG capsule Take 200 mg by mouth daily. Patient not taking: Reported on 12/27/2019    [provider]  ferrous sulfate 325 (65 FE) MG tablet Take 1 tablet (325 mg total) by mouth daily with breakfast. Patient not taking: Reported on  08/21/2019 05/16/19   Vicenta Dunning, MD  polyvinyl alcohol (LIQUIFILM TEARS) 1.4 % ophthalmic solution Place 1 drop into both eyes as needed for dry eyes. Patient not taking: Reported on 12/27/2019 05/16/19   Vicenta Dunning, MD    Physical Exam: Vitals:   12/27/19 2037 12/27/19 2335  BP: 102/67 (!) 139/74  Pulse: 102 99  Resp: 14 23  Temp: 98.2 F (36.8 C)   TempSrc: Oral   SpO2: 99% 98%  Weight: (!) 97.5 kg   Height: 5\' 6"  (1.676 m)     Constitutional: NAD, calm, comfortable, nontoxic well-appearing appearing male sitting upright in bed Vitals:   12/27/19 2037 12/27/19 2335  BP: 102/67 (!) 139/74  Pulse: 102 99  Resp: 14 23  Temp: 98.2 F (36.8 C)   TempSrc: Oral   SpO2: 99% 98%  Weight: (!) 97.5 kg   Height: 5\' 6"  (1.676 m)    Eyes: PERRL, lids and conjunctivae normal ENMT: Mucous membranes are moist. Neck: normal, supple Respiratory: clear to auscultation bilaterally, no wheezing, no crackles. Normal respiratory effort. No accessory muscle use.  Cardiovascular: Regular rate and rhythm, no murmurs / rubs / gallops. No extremity edema.  Abdomen: no tenderness, no masses palpated.  Bowel sounds positive.  No CVA tenderness.  Urostomy bag in place thin right lower quadrant with small amount of urine and clean stoma.   Musculoskeletal: no clubbing / cyanosis. No joint deformity upper and lower extremities. Good ROM, no contractures. Normal muscle tone.  Skin: no rashes, lesions, ulcers. No induration Neurologic: CN 2-12 grossly intact. Sensation intact,  Strength 5/5 in all 4.  Psychiatric: Normal judgment and insight. Alert and oriented x 3. Normal mood.    Labs on Admission: I have personally reviewed following labs and imaging studies  CBC: Recent Labs  Lab 12/26/19 1948 12/27/19 2205  WBC 15.9* 14.0*  NEUTROABS 12.8* 11.1*  HGB 14.2 12.5*  HCT 43.3 37.8*  MCV 93.5 93.1  PLT 159 277*   Basic Metabolic Panel: Recent Labs  Lab 12/26/19 1948 12/27/19 2205    NA 132* 133*  K 4.5 4.7  CL 96* 97*  CO2 26 23  GLUCOSE 120* 109*  BUN 22 26*  CREATININE 1.54* 1.79*  CALCIUM 10.1 10.1   GFR: Estimated Creatinine Clearance: 40.8 mL/min (A) (by C-G formula based on SCr of 1.79 mg/dL (H)). Liver Function Tests: Recent Labs  Lab 12/26/19 1948 12/27/19 2205  AST 39 28  ALT 35 25  ALKPHOS 63 56  BILITOT 1.8* 1.5*  PROT 9.5* 8.2*  ALBUMIN 5.2* 4.1   No results for input(s): LIPASE, AMYLASE in the last 168  hours. No results for input(s): AMMONIA in the last 168 hours. Coagulation Profile: Recent Labs  Lab 12/26/19 1948 12/27/19 2205  INR 1.2 1.4*   Cardiac Enzymes: No results for input(s): CKTOTAL, CKMB, CKMBINDEX, TROPONINI in the last 168 hours. BNP (last 3 results) No results for input(s): PROBNP in the last 8760 hours. HbA1C: No results for input(s): HGBA1C in the last 72 hours. CBG: No results for input(s): GLUCAP in the last 168 hours. Lipid Profile: No results for input(s): CHOL, HDL, LDLCALC, TRIG, CHOLHDL, LDLDIRECT in the last 72 hours. Thyroid Function Tests: No results for input(s): TSH, T4TOTAL, FREET4, T3FREE, THYROIDAB in the last 72 hours. Anemia Panel: No results for input(s): VITAMINB12, FOLATE, FERRITIN, TIBC, IRON, RETICCTPCT in the last 72 hours. Urine analysis:    Component Value Date/Time   COLORURINE YELLOW 12/26/2019 2216   APPEARANCEUR CLEAR 12/26/2019 2216   APPEARANCEUR Cloudy (A) 01/27/2018 1625   LABSPEC 1.010 12/26/2019 2216   PHURINE 6.5 12/26/2019 2216   GLUCOSEU NEGATIVE 12/26/2019 2216   HGBUR LARGE (A) 12/26/2019 2216   BILIRUBINUR NEGATIVE 12/26/2019 2216   BILIRUBINUR Negative 01/27/2018 1625   KETONESUR NEGATIVE 12/26/2019 2216   PROTEINUR 100 (A) 12/26/2019 2216   NITRITE POSITIVE (A) 12/26/2019 2216   LEUKOCYTESUR MODERATE (A) 12/26/2019 2216    Radiological Exams on Admission: DG Chest 2 View  Result Date: 12/26/2019 CLINICAL DATA:  74 year old male with sepsis EXAM: CHEST - 2  VIEW COMPARISON:  Chest radiograph dated 10/31/2019. FINDINGS: There is no focal consolidation, pleural effusion, or pneumothorax. Mild cardiomegaly. No acute osseous pathology. Bilateral shoulder arthroplasties. IMPRESSION: No active cardiopulmonary disease. Electronically Signed   By: Anner Crete M.D.   On: 12/26/2019 19:35   DG Chest Port 1 View  Result Date: 12/27/2019 CLINICAL DATA:  74 year old male with fever. EXAM: PORTABLE CHEST 1 VIEW COMPARISON:  Chest radiograph dated 12/26/2019. FINDINGS: No focal consolidation, pleural effusion, pneumothorax. Mild cardiomegaly. No acute osseous pathology. Bilateral shoulder hemi arthroplasties. IMPRESSION: No active cardiopulmonary disease. Electronically Signed   By: Anner Crete M.D.   On: 12/27/2019 21:22      Assessment/Plan  Klebsiella/gram-negative rods bacteremia with UTI Continue IV meropenem per pharmacy given history of ESBL Continue to monitor fever curve and consider repeat blood culture  Thrombocytopenia Likely reactive due to infection.  Continue to monitor repeat CBC  AKI with hx of left nephrectomy Has received a liter of normal saline bolus in the ED.  Repeat labs in the morning.  Avoid nephrotoxic agent.  Elevated bilirubin Unclear etiology.  Monitor with repeat CMP.  OSA Intolerant of CPAP but uses 2.5 L at night  Hypertension Continue amlodipine.  Hold lisinopril for now due to AKI.  Hyperlipidemia Continue statin  History of bladder cancer s/p urostomy No signs of infection of the stoma  Obesity BMI greater than 30  DVT prophylaxis:.Lovenox Code Status: Full Family Communication: Plan discussed with patient at bedside  disposition Plan: Home with at least 2 midnight stays  Consults called:  Admission status: inpatient  Status is: Inpatient  Remains inpatient appropriate because:Inpatient level of care appropriate due to severity of illness   Dispo: The patient is from: Home               Anticipated d/c is to: Home              Anticipated d/c date is: 2 days              Patient currently is not medically stable  to d/c.         Orene Desanctis DO [facility]   If 7PM-7AM, please contact night-coverage [email or web address]   12/28/2019, 12:43 AM

## 2019-12-28 NOTE — Consult Note (Signed)
[place] for Infectious Disease    Date of Admission:  12/27/2019   Total days of antibiotics 3        Day 2 meropenem              Reason for Consult: Bacteremic Klebsiella UTI    Referring Provider: Dr. Marijo Sanes  Assessment: I agree that it is likely that his Klebsiella is the same ESBL producing strain that he had in January.  Plan: 1. Continue meropenem  Principal Problem:   Bacteremia due to Klebsiella pneumoniae Active Problems:   UTI (urinary tract infection)   Malignant neoplasm of urinary bladder (HCC)   S/P radical cystoprostatectomy   S/p nephrectomy   Obesity (BMI 30-39.9)   HTN (hypertension)   Pure hypercholesterolemia   AKI (acute kidney injury) ([place])   OSA (obstructive sleep apnea)   Scheduled Meds: . amLODipine  10 mg Oral Daily  . atorvastatin  20 mg Oral Daily  . enoxaparin (LOVENOX) injection  40 mg Subcutaneous Q24H   Continuous Infusions: . meropenem (MERREM) IV 1 g (12/28/19 1012)   PRN Meds:.acetaminophen  HPI: Billy Rasmussen is a 74 y.o. male with a history of bladder cancer and recurrent UTIs.  He underwent cystoprostatectomy and ileal conduit formation in June of last year.  Because of recurrent UTIs he underwent left nephrectomy in February.  He was admitted 2 days ago with fever and has Klebsiella growing in his urine and blood cultures.  Urine culture in January grew an ESBL producing Klebsiella, Pseudomonas and Enterococcus.  He says that he is feeling a little bit better today.   Review of Systems: Review of Systems  Constitutional: Positive for malaise/fatigue. Negative for chills, diaphoresis and fever.  Gastrointestinal: Negative for abdominal pain, diarrhea, nausea and vomiting.  Genitourinary: Negative for flank pain and hematuria.    Past Medical History:  Diagnosis Date  . Arthritis    knees, shoulders, elbows  . Bladder cancer [facility])    urologist-  dr Junious Silk  . Diverticulosis of colon   .  Fibromyalgia   . GERD (gastroesophageal reflux disease)   . History of diverticulitis of colon   . History of gastric ulcer    due to aleve  . Hypertension   . Lower urinary tract symptoms (LUTS)   . OSA (obstructive sleep apnea)    NON-COMPLIANT  CPAP  --- BUT PT USES OXYGEN AT NIGHT 2.5L VIA [place] (PT'S DECISION)  . PONV (postoperative nausea and vomiting)   . Psoriasis   . Sepsis [facility])    january 2021  . Tinnitus    right ear more, has tranmitter in right ear removable at hs  . Wears glasses   . Wears partial dentures     Social History   Tobacco Use  . Smoking status: Former Smoker    Packs/day: 2.00    Years: 40.00    Pack years: 80.00    Types: Cigarettes    Quit date: 06/01/1997    Years since quitting: 22.5  . Smokeless tobacco: Never Used  Vaping Use  . Vaping Use: Never used  Substance Use Topics  . Alcohol use: No  . Drug use: No    Family History  Problem Relation Age of Onset  . Alzheimer's disease Mother   . Heart attack Father   . Heart disease Father   . Irregular heart beat Brother        DEFIB.  . Congestive Heart  Failure Brother   . Diabetes Daughter    No Known Allergies  OBJECTIVE: Blood pressure 126/79, pulse 92, temperature 97.7 F (36.5 C), temperature source Oral, resp. rate 18, height 5\' 6"  (1.676 m), weight (!) 99 kg, SpO2 96 %.  Physical Exam Constitutional:      Comments: He is resting quietly in bed watching television.  Cardiovascular:     Rate and Rhythm: Normal rate and regular rhythm.     Heart sounds: No murmur heard.   Pulmonary:     Effort: Pulmonary effort is normal.     Breath sounds: Normal breath sounds.  Abdominal:     General: There is no distension.     Palpations: Abdomen is soft.     Tenderness: There is no right CVA tenderness or left CVA tenderness.     Comments: Right sided ileal conduit with clear yellow urine in the bag.  Psychiatric:        Mood and Affect: Mood normal.     Lab Results Lab Results   Component Value Date   WBC 10.5 12/28/2019   HGB 11.3 (L) 12/28/2019   HCT 34.0 (L) 12/28/2019   MCV 91.9 12/28/2019   PLT 128 (L) 12/28/2019    Lab Results  Component Value Date   CREATININE 1.52 (H) 12/28/2019   BUN 27 (H) 12/28/2019   NA 133 (L) 12/28/2019   K 3.9 12/28/2019   CL 102 12/28/2019   CO2 21 (L) 12/28/2019    Lab Results  Component Value Date   ALT 21 12/28/2019   AST 25 12/28/2019   ALKPHOS 50 12/28/2019   BILITOT 0.9 12/28/2019     Microbiology: Recent Results (from the past 240 hour(s))  Culture, blood (Routine x 2)     Status: None (Preliminary result)   Collection Time: 12/26/19  7:48 PM   Specimen: BLOOD  Result Value Ref Range Status   Specimen Description   Final    BLOOD RIGHT ANTECUBITAL Performed at [facility], [place] [address], [place], [place] [address]    Special Requests   Final    BOTTLES DRAWN AEROBIC AND ANAEROBIC Blood Culture adequate volume Performed at [place] [address], [place], [place] [address]    Culture  Setup Time   Final    GRAM NEGATIVE RODS ANAEROBIC BOTTLE ONLY Organism ID to follow CRITICAL RESULT CALLED TO, READ BACK BY AND VERIFIED WITH: Bernita Raisin RN [id number] 12/27/19 A  BROWNING    Culture   Final    NO GROWTH 1 DAY Performed at [facility], [place] [address], [place], [place] [address]    Report Status PENDING  Incomplete  Culture, blood (Routine x 2)     Status: None (Preliminary result)   Collection Time: 12/26/19  7:48 PM   Specimen: BLOOD  Result Value Ref Range Status   Specimen Description   Final    BLOOD RIGHT ANTECUBITAL Performed at [place] [address] [place], [place] [address]    Special Requests   Final    BOTTLES DRAWN AEROBIC AND ANAEROBIC Blood Culture adequate volume Performed at [place] [address] [place], [place] [address]    Culture   Final    NO GROWTH 2 DAYS Performed at  [facility] [place] [address] [place], [place] [address]    Report Status PENDING  Incomplete  Blood Culture ID Panel (Reflexed)     Status: Abnormal  Collection Time: 12/26/19  7:48 PM  Result Value Ref Range Status   Enterococcus species NOT DETECTED NOT DETECTED Final   Listeria monocytogenes NOT DETECTED NOT DETECTED Final   Staphylococcus species NOT DETECTED NOT DETECTED Final   Staphylococcus aureus (BCID) NOT DETECTED NOT DETECTED Final   Streptococcus species NOT DETECTED NOT DETECTED Final   Streptococcus agalactiae NOT DETECTED NOT DETECTED Final   Streptococcus pneumoniae NOT DETECTED NOT DETECTED Final   Streptococcus pyogenes NOT DETECTED NOT DETECTED Final   Acinetobacter baumannii NOT DETECTED NOT DETECTED Final   Enterobacteriaceae species DETECTED (A) NOT DETECTED Final    Comment: Enterobacteriaceae represent a large family of gram-negative bacteria, not a single organism. CRITICAL RESULT CALLED TO, READ BACK BY AND VERIFIED WITH: Bernita Raisin RN [id number] 12/27/19 A BROWNING    Enterobacter cloacae complex NOT DETECTED NOT DETECTED Final   Escherichia coli NOT DETECTED NOT DETECTED Final   Klebsiella oxytoca NOT DETECTED NOT DETECTED Final   Klebsiella pneumoniae DETECTED (A) NOT DETECTED Final    Comment: CRITICAL RESULT CALLED TO, READ BACK BY AND VERIFIED WITH: Bernita Raisin RN [phone] 12/27/19 A BROWNING    Proteus species NOT DETECTED NOT DETECTED Final   Serratia marcescens NOT DETECTED NOT DETECTED Final   Carbapenem resistance NOT DETECTED NOT DETECTED Final   Haemophilus influenzae NOT DETECTED NOT DETECTED Final   Neisseria meningitidis NOT DETECTED NOT DETECTED Final   Pseudomonas aeruginosa NOT DETECTED NOT DETECTED Final   Candida albicans NOT DETECTED NOT DETECTED Final   Candida glabrata NOT DETECTED NOT DETECTED Final   Candida krusei NOT DETECTED NOT DETECTED Final   Candida parapsilosis NOT DETECTED NOT DETECTED Final   Candida tropicalis NOT DETECTED  NOT DETECTED Final    Comment: Performed at [facility], [place] [address], [place], [place] [address]  Urine Culture     Status: Abnormal (Preliminary result)   Collection Time: 12/26/19 10:16 PM   Specimen: Urine, Clean Catch  Result Value Ref Range Status   Specimen Description   Final    URINE, CLEAN CATCH Performed at [facility], [place] [address], [place], [place] [address]    Special Requests   Final    NONE Performed at [facility], [place] [address], [place], [place] [address]    Culture (A)  Final    >=100,000 COLONIES/mL KLEBSIELLA PNEUMONIAE SUSCEPTIBILITIES TO FOLLOW Performed at [facility], [place] [address] [place], [place] [address]    Report Status PENDING  Incomplete  SARS Coronavirus 2 by RT PCR (hospital order, performed in [facility] hospital lab) Nasopharyngeal Nasopharyngeal Swab     Status: None   Collection Time: 12/27/19 10:05 PM   Specimen: Nasopharyngeal Swab  Result Value Ref Range Status   SARS Coronavirus 2 NEGATIVE NEGATIVE Final    Comment: (NOTE) SARS-CoV-2 target nucleic acids are NOT DETECTED.  The SARS-CoV-2 RNA is generally detectable in upper and lower respiratory specimens during the acute phase of infection. The lowest concentration of SARS-CoV-2 viral copies this assay can detect is 250 copies / mL. A negative result does not preclude SARS-CoV-2 infection and should not be used as the sole basis for treatment or other patient management decisions.  A negative result may occur with improper specimen collection / handling, submission of specimen other than nasopharyngeal swab, presence of viral mutation(s) within the areas targeted by this assay, and inadequate number of viral copies (<250 copies / mL). A negative result must be combined with  clinical observations, patient history, and epidemiological information.  Fact Sheet for Patients:   StrictlyIdeas.no  Fact  Sheet for Healthcare Providers: BankingDealers.co.za  This test is not yet approved or  cleared by the [place] FDA and has been authorized for detection and/or diagnosis of SARS-CoV-2 by FDA under an Emergency Use Authorization (EUA).  This EUA will remain in effect (meaning this test can be used) for the duration of the COVID-19 declaration under Section 564(b)(1) of the Act, 21 U.S.C. section 360bbb-3(b)(1), unless the authorization is terminated or revoked sooner.  Performed at [facility], [place] [address], [place], [place] [address]     Michel Bickers, [place] for Infectious [place] [phone] pager   [phone] cell 12/28/2019, 2:15 PM

## 2019-12-28 NOTE — Care Plan (Signed)
Billy Rasmussen is a 74 y.o. male with medical history significant for bladder cancer s/p urostomy, prostatectomy, nephrectomy, OSA on nocturnal O2, hypertension and GERD who presents due to bacteremia.  Patient was evaluated in the ED yesterday and was found to to be febrile with a UTI.  He was discharged home with Cipro which he has been compliant with.  However his blood culture later grew gram-negative rods and Klebsiella and he was called back to present to the ED. His symptoms of feeling "feverish" and decreased appetite started 2 days ago.  He also noticed more cloudiness of his urine inside his urostomy bag. No nausea or vomiting, or diarrhea. patient has history of recurrent UTIs and has history of Klebsiella and Serratia and ESBL in the past.  ED Course: He was afebrile and normotensive on room air. Had leukocytosis of 14.  New thrombocytopenia of 125.  Mild hyponatremia 133, creatinine elevated to 1.75 from a prior of 1.5.  Mildly elevated total bilirubin of 1.5 but normal LFTs.  Patient was seen and examined at bedside.  He reports feeling better still has some burning while urination.  Assessment/Plan  Klebsiella/gram-negative rods bacteremia with UTI Continue IV meropenem per pharmacy given history of ESBL Continue to monitor fever curve and consider repeat blood culture. ID consulted recommended to continue meropenem for now  Thrombocytopenia Likely reactive due to infection.  Continue to monitor repeat CBC  AKI with hx of left nephrectomy-improving Has received a liter of normal saline bolus in the ED.  Repeat labs in the morning.  Avoid nephrotoxic agent.  Elevated bilirubin Unclear etiology.  Monitor with repeat CMP.  OSA Intolerant of CPAP but uses 2.5 L at night  Hypertension Continue amlodipine.  Hold lisinopril for now due to AKI.  Hyperlipidemia Continue statin  History of bladder cancer s/p urostomy No signs of infection of the  stoma  Obesity BMI greater than 30  DVT prophylaxis:.Lovenox Code Status: Full Family Communication: Plan discussed with patient at bedside  disposition Plan: Home with at least 2 midnight stays  Consults called: Infectious diseases  Dispo: The patient is from: Home  Anticipated d/c is to: Home  Anticipated d/c date is: 2 days  Patient currently is not medically stable to d/c.

## 2019-12-28 NOTE — ED Notes (Signed)
Admitting doctor at bedside. Will transport patient when available.

## 2019-12-29 DIAGNOSIS — B961 Klebsiella pneumoniae [K. pneumoniae] as the cause of diseases classified elsewhere: Secondary | ICD-10-CM

## 2019-12-29 DIAGNOSIS — R7881 Bacteremia: Secondary | ICD-10-CM | POA: Diagnosis not present

## 2019-12-29 LAB — PHOSPHORUS: Phosphorus: 2.9 mg/dL (ref 2.5–4.6)

## 2019-12-29 LAB — CBC
HCT: 38.7 % — ABNORMAL LOW (ref 39.0–52.0)
Hemoglobin: 12.4 g/dL — ABNORMAL LOW (ref 13.0–17.0)
MCH: 30.2 pg (ref 26.0–34.0)
MCHC: 32 g/dL (ref 30.0–36.0)
MCV: 94.4 fL (ref 80.0–100.0)
Platelets: 143 10*3/uL — ABNORMAL LOW (ref 150–400)
RBC: 4.1 MIL/uL — ABNORMAL LOW (ref 4.22–5.81)
RDW: 14.3 % (ref 11.5–15.5)
WBC: 6.6 10*3/uL (ref 4.0–10.5)
nRBC: 0 % (ref 0.0–0.2)

## 2019-12-29 LAB — BASIC METABOLIC PANEL
Anion gap: 8 (ref 5–15)
BUN: 26 mg/dL — ABNORMAL HIGH (ref 8–23)
CO2: 25 mmol/L (ref 22–32)
Calcium: 9.7 mg/dL (ref 8.9–10.3)
Chloride: 100 mmol/L (ref 98–111)
Creatinine, Ser: 1.47 mg/dL — ABNORMAL HIGH (ref 0.61–1.24)
GFR calc Af Amer: 54 mL/min — ABNORMAL LOW (ref >60)
GFR calc non Af Amer: 47 mL/min — ABNORMAL LOW (ref >60)
Glucose, Bld: 100 mg/dL — ABNORMAL HIGH (ref 70–99)
Potassium: 3.9 mmol/L (ref 3.5–5.1)
Sodium: 133 mmol/L — ABNORMAL LOW (ref 135–145)

## 2019-12-29 LAB — URINE CULTURE
Culture: >=100000 — AB
Culture: NO GROWTH

## 2019-12-29 LAB — CULTURE, BLOOD (ROUTINE X 2): Special Requests: ADEQUATE

## 2019-12-29 LAB — MAGNESIUM: Magnesium: 2.1 mg/dL (ref 1.7–2.4)

## 2019-12-29 MED ORDER — SODIUM CHLORIDE 0.9 % IV SOLN
1.0000 g | Freq: Three times a day (TID) | INTRAVENOUS | Status: DC
  Administered 2019-12-29: 1 g via INTRAVENOUS
  Filled 2019-12-29 (×2): qty 1

## 2019-12-29 MED ORDER — TRAMADOL HCL 50 MG PO TABS: 50.0000 mg | ORAL_TABLET | Freq: Four times a day (QID) | ORAL | 0 refills | Status: DC | PRN

## 2019-12-29 MED ORDER — TRAMADOL HCL 50 MG PO TABS
50.0000 mg | ORAL_TABLET | Freq: Once | ORAL | Status: AC
  Administered 2019-12-29: 50 mg via ORAL
  Filled 2019-12-29: qty 1

## 2019-12-29 MED ORDER — TRAMADOL HCL 50 MG PO TABS: 50.0000 mg | ORAL_TABLET | Freq: Four times a day (QID) | ORAL | 0 refills | Status: AC | PRN

## 2019-12-29 NOTE — Progress Notes (Signed)
Pt discharged home with spouse in stable condition. Discharge instructions and script given. No immediate questions or concerns at this time.

## 2019-12-29 NOTE — Progress Notes (Signed)
Patient ID: Billy Rasmussen, male   DOB: 03-Mar-1946, 74 y.o.   MRN: [id number]         [facility] for Infectious Disease  Date of Admission:  12/27/2019   Total days of antibiotics 4        Day 3 meropenem         ASSESSMENT: Urine and blood cultures have both grown an ESBL producing Klebsiella.  Fortunately his isolate is susceptible to fluoroquinolone antibiotics.  PLAN: 1. Recommend discharge home with 4 more days of oral ciprofloxacin (he already has a supply at home) 2. I will sign off now  Principal Problem:   Bacteremia due to Klebsiella pneumoniae Active Problems:   UTI (urinary tract infection)   Malignant neoplasm of urinary bladder (HCC)   S/P radical cystoprostatectomy   S/p nephrectomy   Obesity (BMI 30-39.9)   HTN (hypertension)   Pure hypercholesterolemia   AKI (acute kidney injury) ([place])   OSA (obstructive sleep apnea)   Scheduled Meds: . amLODipine  10 mg Oral Daily  . atorvastatin  20 mg Oral Daily  . enoxaparin (LOVENOX) injection  40 mg Subcutaneous Q24H   Continuous Infusions: . meropenem (MERREM) IV 1 g (12/29/19 0937)   PRN Meds:.acetaminophen   SUBJECTIVE: He is not completely back to normal but he is feeling much better.  Review of Systems: Review of Systems  Constitutional: Negative for chills, diaphoresis and fever.  Gastrointestinal: Negative for abdominal pain, diarrhea, nausea and vomiting.  Genitourinary: Negative for flank pain.    No Known Allergies  OBJECTIVE: Vitals:   12/28/19 1406 12/28/19 2137 12/29/19 0639 12/29/19 1420  BP: 126/79 (!) 129/80 (!) 162/91 (!) 143/85  Pulse: 92 95 72 88  Resp: 18 18 20 20   Temp: 97.7 F (36.5 C) 99.5 F (37.5 C) 97.9 F (36.6 C) 97.7 F (36.5 C)  TempSrc: Oral Oral Oral Oral  SpO2: 96% 96% 100% 97%  Weight:      Height:       Body mass index is 35.23 kg/m.  Physical Exam Constitutional:      Comments: He is sitting up in a chair watching television.  He is in good  spirits.  Cardiovascular:     Rate and Rhythm: Normal rate and regular rhythm.     Heart sounds: No murmur heard.   Pulmonary:     Effort: Pulmonary effort is normal.     Breath sounds: Normal breath sounds.  Abdominal:     Palpations: Abdomen is soft.     Tenderness: There is no abdominal tenderness.  Psychiatric:        Mood and Affect: Mood normal.     Lab Results Lab Results  Component Value Date   WBC 6.6 12/29/2019   HGB 12.4 (L) 12/29/2019   HCT 38.7 (L) 12/29/2019   MCV 94.4 12/29/2019   PLT 143 (L) 12/29/2019    Lab Results  Component Value Date   CREATININE 1.47 (H) 12/29/2019   BUN 26 (H) 12/29/2019   NA 133 (L) 12/29/2019   K 3.9 12/29/2019   CL 100 12/29/2019   CO2 25 12/29/2019    Lab Results  Component Value Date   ALT 21 12/28/2019   AST 25 12/28/2019   ALKPHOS 50 12/28/2019   BILITOT 0.9 12/28/2019     Microbiology: Recent Results (from the past 240 hour(s))  Culture, blood (Routine x 2)     Status: Abnormal   Collection Time: 12/26/19  7:48 PM   Specimen:  BLOOD  Result Value Ref Range Status   Specimen Description   Final    BLOOD RIGHT ANTECUBITAL Performed at [facility] [address], [place], [place] [address]    Special Requests   Final    BOTTLES DRAWN AEROBIC AND ANAEROBIC Blood Culture adequate volume Performed at [place] [address], [place], [place] [address]    Culture  Setup Time   Final    GRAM NEGATIVE RODS ANAEROBIC BOTTLE ONLY Organism ID to follow CRITICAL RESULT CALLED TO, READ BACK BY AND VERIFIED WITH: Bernita Raisin RN [id number] 12/27/19 A  BROWNING Performed at [facility], [address] [place], [place] [address]    Culture (A)  Final    KLEBSIELLA PNEUMONIAE Confirmed Extended Spectrum Beta-Lactamase Producer (ESBL).  In bloodstream infections from ESBL organisms, carbapenems are preferred over piperacillin/tazobactam. They are shown to have a lower risk of  mortality.    Report Status 12/29/2019 FINAL  Final   Organism ID, Bacteria KLEBSIELLA PNEUMONIAE  Final      Susceptibility   Klebsiella pneumoniae - MIC*    AMPICILLIN >=32 RESISTANT Resistant     CEFAZOLIN >=64 RESISTANT Resistant     CEFEPIME >=32 RESISTANT Resistant     CEFTAZIDIME RESISTANT Resistant     CEFTRIAXONE >=64 RESISTANT Resistant     CIPROFLOXACIN 1 SENSITIVE Sensitive     GENTAMICIN <=1 SENSITIVE Sensitive     IMIPENEM <=0.25 SENSITIVE Sensitive     TRIMETH/SULFA >=320 RESISTANT Resistant     AMPICILLIN/SULBACTAM >=32 RESISTANT Resistant     PIP/TAZO 16 SENSITIVE Sensitive     * KLEBSIELLA PNEUMONIAE  Culture, blood (Routine x 2)     Status: None (Preliminary result)   Collection Time: 12/26/19  7:48 PM   Specimen: BLOOD  Result Value Ref Range Status   Specimen Description   Final    BLOOD RIGHT ANTECUBITAL Performed at [facility] [place] [address] [place], [place] [address]    Special Requests   Final    BOTTLES DRAWN AEROBIC AND ANAEROBIC Blood Culture adequate volume Performed at [facility] [place] [address], [place], [place] [address]    Culture   Final    NO GROWTH 2 DAYS Performed at [place] [address] [place], [place] [address]    Report Status PENDING  Incomplete  Blood Culture ID Panel (Reflexed)     Status: Abnormal   Collection Time: 12/26/19  7:48 PM  Result Value Ref Range Status   Enterococcus species NOT DETECTED NOT DETECTED Final   Listeria monocytogenes NOT DETECTED NOT DETECTED Final   Staphylococcus species NOT DETECTED NOT DETECTED Final   Staphylococcus aureus (BCID) NOT DETECTED NOT DETECTED Final   Streptococcus species NOT DETECTED NOT DETECTED Final   Streptococcus agalactiae NOT DETECTED NOT DETECTED Final   Streptococcus pneumoniae NOT DETECTED NOT DETECTED Final   Streptococcus pyogenes NOT DETECTED NOT DETECTED Final   Acinetobacter baumannii NOT DETECTED NOT DETECTED  Final   Enterobacteriaceae species DETECTED (A) NOT DETECTED Final    Comment: Enterobacteriaceae represent a large family of gram-negative bacteria, not a single organism. CRITICAL RESULT CALLED TO, READ BACK BY AND VERIFIED WITH: Bernita Raisin RN [id number] 12/27/19 A BROWNING    Enterobacter cloacae complex NOT DETECTED NOT DETECTED Final   Escherichia coli NOT DETECTED NOT DETECTED Final   Klebsiella oxytoca NOT DETECTED NOT DETECTED Final   Klebsiella pneumoniae DETECTED (A) NOT DETECTED Final  Comment: CRITICAL RESULT CALLED TO, READ BACK BY AND VERIFIED WITH: Bernita Raisin RN [phone] 12/27/19 A BROWNING    Proteus species NOT DETECTED NOT DETECTED Final   Serratia marcescens NOT DETECTED NOT DETECTED Final   Carbapenem resistance NOT DETECTED NOT DETECTED Final   Haemophilus influenzae NOT DETECTED NOT DETECTED Final   Neisseria meningitidis NOT DETECTED NOT DETECTED Final   Pseudomonas aeruginosa NOT DETECTED NOT DETECTED Final   Candida albicans NOT DETECTED NOT DETECTED Final   Candida glabrata NOT DETECTED NOT DETECTED Final   Candida krusei NOT DETECTED NOT DETECTED Final   Candida parapsilosis NOT DETECTED NOT DETECTED Final   Candida tropicalis NOT DETECTED NOT DETECTED Final    Comment: Performed at [facility], [place] [address], [place], [place] [address]  Urine Culture     Status: Abnormal   Collection Time: 12/26/19 10:16 PM   Specimen: Urine, Clean Catch  Result Value Ref Range Status   Specimen Description   Final    URINE, CLEAN CATCH Performed at [facility], [place] [address] [place], [place] [address]    Special Requests   Final    NONE Performed at [facility], [place] [address], [place], [place] [address]    Culture (A)  Final    >=100,000 COLONIES/mL KLEBSIELLA PNEUMONIAE Confirmed Extended Spectrum Beta-Lactamase Producer (ESBL).  In bloodstream infections from ESBL organisms, carbapenems are preferred over  piperacillin/tazobactam. They are shown to have a lower risk of mortality.    Report Status 12/29/2019 FINAL  Final   Organism ID, Bacteria KLEBSIELLA PNEUMONIAE (A)  Final      Susceptibility   Klebsiella pneumoniae - MIC*    AMPICILLIN >=32 RESISTANT Resistant     CEFAZOLIN >=64 RESISTANT Resistant     CEFTRIAXONE >=64 RESISTANT Resistant     CIPROFLOXACIN 1 SENSITIVE Sensitive     GENTAMICIN <=1 SENSITIVE Sensitive     IMIPENEM <=0.25 SENSITIVE Sensitive     NITROFURANTOIN 256 RESISTANT Resistant     TRIMETH/SULFA >=320 RESISTANT Resistant     AMPICILLIN/SULBACTAM >=32 RESISTANT Resistant     PIP/TAZO 16 SENSITIVE Sensitive     * >=100,000 COLONIES/mL KLEBSIELLA PNEUMONIAE  SARS Coronavirus 2 by RT PCR (hospital order, performed in [place] hospital lab) Nasopharyngeal Nasopharyngeal Swab     Status: None   Collection Time: 12/27/19 10:05 PM   Specimen: Nasopharyngeal Swab  Result Value Ref Range Status   SARS Coronavirus 2 NEGATIVE NEGATIVE Final    Comment: (NOTE) SARS-CoV-2 target nucleic acids are NOT DETECTED.  The SARS-CoV-2 RNA is generally detectable in upper and lower respiratory specimens during the acute phase of infection. The lowest concentration of SARS-CoV-2 viral copies this assay can detect is 250 copies / mL. A negative result does not preclude SARS-CoV-2 infection and should not be used as the sole basis for treatment or other patient management decisions.  A negative result may occur with improper specimen collection / handling, submission of specimen other than nasopharyngeal swab, presence of viral mutation(s) within the areas targeted by this assay, and inadequate number of viral copies (<250 copies / mL). A negative result must be combined with clinical observations, patient history, and epidemiological information.  Fact Sheet for Patients:   StrictlyIdeas.no  Fact Sheet for Healthcare  Providers: BankingDealers.co.za  This test is not yet approved or  cleared by the [place] FDA and has been authorized for detection and/or diagnosis of SARS-CoV-2 by FDA under an Emergency Use Authorization (EUA).  This EUA will remain in effect (meaning this test can be used) for the duration of the COVID-19 declaration under Section 564(b)(1) of the Act, 21 U.S.C. section 360bbb-3(b)(1), unless the authorization is terminated or revoked sooner.  Performed at [facility], [place] [address], [place], [place] [address]   Urine culture     Status: None   Collection Time: 12/28/19  1:50 AM   Specimen: In/Out Cath Urine  Result Value Ref Range Status   Specimen Description   Final    IN/OUT CATH URINE Performed at [place] [address], [place], [place] [address]    Special Requests   Final    NONE Performed at [facility], [place] [address], [place], [place] [address]    Culture   Final    NO GROWTH Performed at [facility], [place] [address], [place], [place] [address]    Report Status 12/29/2019 FINAL  Final    Michel Bickers, MD [facility] for Infectious [place] [facility] [phone] pager   [phone] cell 12/29/2019, 2:23 PM

## 2019-12-29 NOTE — Progress Notes (Signed)
Pharmacy Antibiotic Note  Billy Rasmussen is a 74 y.o. male admitted on 12/27/2019 with K. Pneumoniae bacteremia.  H/O ESBL.  Pharmacy has been consulted for Meropenem dosing. 12/29/2019 Day#2 full meropenem for ESBL Klebsiella urosepsis WBC down to 6  SCr down to 1.47> CrCl 50 ml/min  Plan: Increase to Meropenem 1gm IV q8h F/u ID plan- ? Home w/ ertapenem  Height: 5\' 6"  (167.6 cm) Weight: (!) 99 kg (218 lb 4.1 oz) IBW/kg (Calculated) : 63.8  Temp (24hrs), Avg:98.7 F (37.1 C), Min:97.7 F (36.5 C), Max:99.6 F (37.6 C)  Recent Labs  Lab 12/26/19 1948 12/26/19 2216 12/27/19 2205 12/28/19 0551 12/29/19 0558  WBC 15.9*  --  14.0* 10.5 6.6  CREATININE 1.54*  --  1.79* 1.52* 1.47*  LATICACIDVEN 1.2 1.1 1.2  --   --     Estimated Creatinine Clearance: 50 mL/min (A) (by C-G formula based on SCr of 1.47 mg/dL (H)).    No Known Allergies Antimicrobials this admission:  7/28 Meropenem>> 7/27 cipro x 1 Dose adjustments this admission:  7/30 mero 1 q12> q8 Microbiology results:  7/27 1948 BCx: anaerobic bottle of 1 set with Kleb pneumo 7/27 UCx: > 100K ESBL Kleb pneumo 7/28 COVID: neg 7/29 UCx: sent  Thank you for allowing pharmacy to be a part of this patient's care.  Eudelia Bunch, Pharm.D 12/29/2019 8:37 AM

## 2019-12-29 NOTE — Discharge Instructions (Signed)
Advised to follow-up with primary care physician. Advised to take ciprofloxacin twice daily as advised. Advised to follow-up with ID as scheduled.

## 2019-12-29 NOTE — Discharge Summary (Signed)
Physician Discharge Summary  Billy Rasmussen FYB:[id number] DOB: 03/21/1946 DOA: 12/27/2019  PCP: Billy Norlander, DO  Admit date: 12/27/2019   Discharge date: 12/29/2019  Admitted From:  Home  Disposition:  Home.  Recommendations for Outpatient Follow-up:  1. Follow up with PCP in 1-2 weeks. 2. Please obtain BMP/CBC in one week. 3. Advised to take ciprofloxacin twice daily for four more days. 4. Advised to follow-up with ID as scheduled.  Home Health: No. Equipment/Devices: No  Discharge Condition: Stable CODE STATUS:Full code Diet recommendation: Heart Healthy   Brief Summary / Hospital course: Billy Amrhein Archeris a 74 y.o.malewith medical history significant forbladder cancer s/p urostomy, prostatectomy, nephrectomy, OSA on nocturnal O2, hypertension and GERD who presents to the ED due to bacteremia. Patient was evaluated in the ED one day before and was found to be febrile with a UTI. He was discharged home with Cipro which he has been compliant with. However his blood culture later grew gram-negative rods and Klebsiella and he was called back to present to the ED. He reports  feeling "feverish" and has decreased appetite started 2 days ago. He also noticed more cloudiness of his urine inside his urostomy bag.  He denies nausea or vomiting, or diarrhea.patient has history of recurrent UTIs and has history of Klebsiella and Serratia and ESBL in the past. In the ED He was afebrile and normotensive on room air. He had leukocytosis of 14. New thrombocytopenia of 125. Mild hyponatremia 133, creatinine elevated to 1.75 from a prior of 1.5. Mildly elevated total bilirubin of 1.5 but normal LFTs. Patient was admitted for bacteremia secondary to UTI.  Patient was started on IV meropenem after discussion with pharmacy.  Infectious disease consulted.  Urine and blood cultures were sent.  Sensitivity grows Klebsiella which is sensitive to ciprofloxacin.  Patient has received 3 days of  IV meropenem in the hospital.  As per infectious disease patient can be discharged home on 4 more days of ciprofloxacin.  Patient feels better and wants to be discharged home.  He was given tramadol for muscle spasm while being hospitalized.  Advised to take ciprofloxacin as advised and follow-up with primary care physician in 1 week.  Patient was given prescription for tramadol to take it as needed.   Discharge Diagnoses:  Principal Problem:   Bacteremia due to Klebsiella pneumoniae Active Problems:   Obesity (BMI 30-39.9)   HTN (hypertension)   Pure hypercholesterolemia   Malignant neoplasm of urinary bladder (HCC)   AKI (acute kidney injury) ([place])   OSA (obstructive sleep apnea)   UTI (urinary tract infection)   S/P radical cystoprostatectomy   S/p nephrectomy  He was managed for below problems.  Klebsiella/gram-negative rods bacteremia with UTI Continue IV meropenem per pharmacy given history of ESBL Continue to monitor fever curve and consider repeat blood culture. ID consulted recommended to continue meropenem for now. Blood and urine culture grew Klebsiella sensitive to ciprofloxacin. Patient received 3 days of IV meropenem . As per ID can be discharged home on Cipro for 4 more days.  Thrombocytopenia Likely reactive due to infection. Continue to monitor repeat CBC  AKIwith hx of leftnephrectomy-  Improving Has received a liter of normal saline bolus in the ED. Repeat labs in the morning.  Avoid nephrotoxic agent.  Elevated bilirubin Unclear etiology. Monitor with repeat CMP.  OSA Intolerant of CPAP but uses 2.5 L of O2 at night.  Hypertension Continue amlodipine. Hold lisinopril for now due to AKI. Resume   Hyperlipidemia Continue  statin  History of bladder cancer s/p urostomy No signs of infection of the stoma.  Obesity BMI greater than 30  Discharge Instructions  Discharge Instructions    Call MD for:  difficulty breathing, headache or  visual disturbances   Complete by: As directed    Call MD for:  persistant dizziness or light-headedness   Complete by: As directed    Call MD for:  severe uncontrolled pain   Complete by: As directed    Call MD for:  temperature >100.4   Complete by: As directed    Diet - low sodium heart healthy   Complete by: As directed    Discharge instructions   Complete by: As directed    Advised to follow-up with primary care physician. Advised to take ciprofloxacin twice daily as advised. Advised to follow-up with ID as scheduled.   Increase activity slowly   Complete by: As directed      Allergies as of 12/29/2019   No Known Allergies     Medication List    STOP taking these medications   amoxicillin-clavulanate 875-125 MG tablet Commonly known as: AUGMENTIN     TAKE these medications   amLODipine 10 MG tablet Commonly known as: NORVASC Take 1 tablet (10 mg total) by mouth daily.   atorvastatin 20 MG tablet Commonly known as: LIPITOR Take 1 tablet (20 mg total) by mouth daily.   ciprofloxacin 500 MG tablet Commonly known as: CIPRO Take 1 tablet (500 mg total) by mouth every 12 (twelve) hours.   docusate sodium 100 MG capsule Commonly known as: COLACE Take 200 mg by mouth daily.   ferrous sulfate 325 (65 FE) MG tablet Take 1 tablet (325 mg total) by mouth daily with breakfast.   lisinopril 5 MG tablet Commonly known as: ZESTRIL Take 1 tablet (5 mg total) by mouth daily.   Multivitamin Adult Tabs Take 1 tablet by mouth daily.   OVER THE COUNTER MEDICATION Take 1 tablet by mouth daily.   polyvinyl alcohol 1.4 % ophthalmic solution Commonly known as: LIQUIFILM TEARS Place 1 drop into both eyes as needed for dry eyes.   traMADol 50 MG tablet Commonly known as: Ultram Take 1 tablet (50 mg total) by mouth every 6 (six) hours as needed for up to 3 days.       Follow-up Information    Ronnie Doss M, DO Follow up in 1 week(s).   Specialty: Family  Medicine Contact information: [place] [address] [phone]        Troy Sine, MD .   Specialty: Cardiology Contact information: [address] [place] [address] [phone]              No Known Allergies  Consultations:  Infectious diseases.   Procedures/Studies: DG Chest 2 View  Result Date: 12/26/2019 CLINICAL DATA:  74 year old male with sepsis EXAM: CHEST - 2 VIEW COMPARISON:  Chest radiograph dated 10/31/2019. FINDINGS: There is no focal consolidation, pleural effusion, or pneumothorax. Mild cardiomegaly. No acute osseous pathology. Bilateral shoulder arthroplasties. IMPRESSION: No active cardiopulmonary disease. Electronically Signed   By: Anner Crete M.D.   On: 12/26/2019 19:35   DG Chest Port 1 View  Result Date: 12/27/2019 CLINICAL DATA:  74 year old male with fever. EXAM: PORTABLE CHEST 1 VIEW COMPARISON:  Chest radiograph dated 12/26/2019. FINDINGS: No focal consolidation, pleural effusion, pneumothorax. Mild cardiomegaly. No acute osseous pathology. Bilateral shoulder hemi arthroplasties. IMPRESSION: No active cardiopulmonary disease. Electronically Signed   By:  Anner Crete M.D.   On: 12/27/2019 21:22      Subjective: Patient was seen and examined at bedside.  No overnight event.  Patient reports feeling much better and wants to be discharged home.  Discharge Exam: Vitals:   12/29/19 0639 12/29/19 1420  BP: (!) 162/91 (!) 143/85  Pulse: 72 88  Resp: 20 20  Temp: 97.9 F (36.6 C) 97.7 F (36.5 C)  SpO2: 100% 97%   Vitals:   12/28/19 1406 12/28/19 2137 12/29/19 0639 12/29/19 1420  BP: 126/79 (!) 129/80 (!) 162/91 (!) 143/85  Pulse: 92 95 72 88  Resp: 18 18 20 20   Temp: 97.7 F (36.5 C) 99.5 F (37.5 C) 97.9 F (36.6 C) 97.7 F (36.5 C)  TempSrc: Oral Oral Oral Oral  SpO2: 96% 96% 100% 97%  Weight:      Height:        General: Pt is alert, awake, not in acute distress Cardiovascular:  RRR, S1/S2 +, no rubs, no gallops Respiratory: CTA bilaterally, no wheezing, no rhonchi Abdominal: Soft, NT, ND, bowel sounds + Extremities: no edema, no cyanosis    The results of significant diagnostics from this hospitalization (including imaging, microbiology, ancillary and laboratory) are listed below for reference.     Microbiology: Recent Results (from the past 240 hour(s))  Culture, blood (Routine x 2)     Status: Abnormal   Collection Time: 12/26/19  7:48 PM   Specimen: BLOOD  Result Value Ref Range Status   Specimen Description   Final    BLOOD RIGHT ANTECUBITAL Performed at [facility] [address] [place], [place] [address]    Special Requests   Final    BOTTLES DRAWN AEROBIC AND ANAEROBIC Blood Culture adequate volume Performed at [facility] [address] [place], [place] [address]    Culture  Setup Time   Final    GRAM NEGATIVE RODS ANAEROBIC BOTTLE ONLY Organism ID to follow CRITICAL RESULT CALLED TO, READ BACK BY AND VERIFIED WITH: Bernita Raisin RN [id number] 12/27/19 A  BROWNING Performed at [facility], [address] [place], [place] [address]    Culture (A)  Final    KLEBSIELLA PNEUMONIAE Confirmed Extended Spectrum Beta-Lactamase Producer (ESBL).  In bloodstream infections from ESBL organisms, carbapenems are preferred over piperacillin/tazobactam. They are shown to have a lower risk of mortality.    Report Status 12/29/2019 FINAL  Final   Organism ID, Bacteria KLEBSIELLA PNEUMONIAE  Final      Susceptibility   Klebsiella pneumoniae - MIC*    AMPICILLIN >=32 RESISTANT Resistant     CEFAZOLIN >=64 RESISTANT Resistant     CEFEPIME >=32 RESISTANT Resistant     CEFTAZIDIME RESISTANT Resistant     CEFTRIAXONE >=64 RESISTANT Resistant     CIPROFLOXACIN 1 SENSITIVE Sensitive     GENTAMICIN <=1 SENSITIVE Sensitive     IMIPENEM <=0.25 SENSITIVE Sensitive     TRIMETH/SULFA >=320 RESISTANT Resistant      AMPICILLIN/SULBACTAM >=32 RESISTANT Resistant     PIP/TAZO 16 SENSITIVE Sensitive     * KLEBSIELLA PNEUMONIAE  Culture, blood (Routine x 2)     Status: None (Preliminary result)   Collection Time: 12/26/19  7:48 PM   Specimen: BLOOD  Result Value Ref Range Status   Specimen Description   Final    BLOOD RIGHT ANTECUBITAL Performed at [facility] [address], [place], [place] [address]    Special Requests   Final  BOTTLES DRAWN AEROBIC AND ANAEROBIC Blood Culture adequate volume Performed at [facility] [address], [place], [place] [address]    Culture   Final    NO GROWTH 3 DAYS Performed at [facility], [place] [address] [place], [place] [address]    Report Status PENDING  Incomplete  Blood Culture ID Panel (Reflexed)     Status: Abnormal   Collection Time: 12/26/19  7:48 PM  Result Value Ref Range Status   Enterococcus species NOT DETECTED NOT DETECTED Final   Listeria monocytogenes NOT DETECTED NOT DETECTED Final   Staphylococcus species NOT DETECTED NOT DETECTED Final   Staphylococcus aureus (BCID) NOT DETECTED NOT DETECTED Final   Streptococcus species NOT DETECTED NOT DETECTED Final   Streptococcus agalactiae NOT DETECTED NOT DETECTED Final   Streptococcus pneumoniae NOT DETECTED NOT DETECTED Final   Streptococcus pyogenes NOT DETECTED NOT DETECTED Final   Acinetobacter baumannii NOT DETECTED NOT DETECTED Final   Enterobacteriaceae species DETECTED (A) NOT DETECTED Final    Comment: Enterobacteriaceae represent a large family of gram-negative bacteria, not a single organism. CRITICAL RESULT CALLED TO, READ BACK BY AND VERIFIED WITH: Bernita Raisin RN [id number] 12/27/19 A BROWNING    Enterobacter cloacae complex NOT DETECTED NOT DETECTED Final   Escherichia coli NOT DETECTED NOT DETECTED Final   Klebsiella oxytoca NOT DETECTED NOT DETECTED Final   Klebsiella pneumoniae DETECTED (A) NOT DETECTED Final    Comment: CRITICAL RESULT  CALLED TO, READ BACK BY AND VERIFIED WITH: Bernita Raisin RN [phone] 12/27/19 A BROWNING    Proteus species NOT DETECTED NOT DETECTED Final   Serratia marcescens NOT DETECTED NOT DETECTED Final   Carbapenem resistance NOT DETECTED NOT DETECTED Final   Haemophilus influenzae NOT DETECTED NOT DETECTED Final   Neisseria meningitidis NOT DETECTED NOT DETECTED Final   Pseudomonas aeruginosa NOT DETECTED NOT DETECTED Final   Candida albicans NOT DETECTED NOT DETECTED Final   Candida glabrata NOT DETECTED NOT DETECTED Final   Candida krusei NOT DETECTED NOT DETECTED Final   Candida parapsilosis NOT DETECTED NOT DETECTED Final   Candida tropicalis NOT DETECTED NOT DETECTED Final    Comment: Performed at [facility], [place] [address], [place], [place] [address]  Urine Culture     Status: Abnormal   Collection Time: 12/26/19 10:16 PM   Specimen: Urine, Clean Catch  Result Value Ref Range Status   Specimen Description   Final    URINE, CLEAN CATCH Performed at [facility], [place] [address] [place], [place] [address]    Special Requests   Final    NONE Performed at [facility], [place] [address], [place], [place] [address]    Culture (A)  Final    >=100,000 COLONIES/mL KLEBSIELLA PNEUMONIAE Confirmed Extended Spectrum Beta-Lactamase Producer (ESBL).  In bloodstream infections from ESBL organisms, carbapenems are preferred over piperacillin/tazobactam. They are shown to have a lower risk of mortality.    Report Status 12/29/2019 FINAL  Final   Organism ID, Bacteria KLEBSIELLA PNEUMONIAE (A)  Final      Susceptibility   Klebsiella pneumoniae - MIC*    AMPICILLIN >=32 RESISTANT Resistant     CEFAZOLIN >=64 RESISTANT Resistant     CEFTRIAXONE >=64 RESISTANT Resistant     CIPROFLOXACIN 1 SENSITIVE Sensitive     GENTAMICIN <=1 SENSITIVE Sensitive     IMIPENEM <=0.25 SENSITIVE Sensitive     NITROFURANTOIN 256 RESISTANT Resistant     TRIMETH/SULFA >=320 RESISTANT  Resistant  AMPICILLIN/SULBACTAM >=32 RESISTANT Resistant     PIP/TAZO 16 SENSITIVE Sensitive     * >=100,000 COLONIES/mL KLEBSIELLA PNEUMONIAE  SARS Coronavirus 2 by RT PCR (hospital order, performed in [facility] hospital lab) Nasopharyngeal Nasopharyngeal Swab     Status: None   Collection Time: 12/27/19 10:05 PM   Specimen: Nasopharyngeal Swab  Result Value Ref Range Status   SARS Coronavirus 2 NEGATIVE NEGATIVE Final    Comment: (NOTE) SARS-CoV-2 target nucleic acids are NOT DETECTED.  The SARS-CoV-2 RNA is generally detectable in upper and lower respiratory specimens during the acute phase of infection. The lowest concentration of SARS-CoV-2 viral copies this assay can detect is 250 copies / mL. A negative result does not preclude SARS-CoV-2 infection and should not be used as the sole basis for treatment or other patient management decisions.  A negative result may occur with improper specimen collection / handling, submission of specimen other than nasopharyngeal swab, presence of viral mutation(s) within the areas targeted by this assay, and inadequate number of viral copies (<250 copies / mL). A negative result must be combined with clinical observations, patient history, and epidemiological information.  Fact Sheet for Patients:   StrictlyIdeas.no  Fact Sheet for Healthcare Providers: BankingDealers.co.za  This test is not yet approved or  cleared by the [place] FDA and has been authorized for detection and/or diagnosis of SARS-CoV-2 by FDA under an Emergency Use Authorization (EUA).  This EUA will remain in effect (meaning this test can be used) for the duration of the COVID-19 declaration under Section 564(b)(1) of the Act, 21 U.S.C. section 360bbb-3(b)(1), unless the authorization is terminated or revoked sooner.  Performed at [facility], [place] [address] [place], [place] [address]    Urine culture     Status: None   Collection Time: 12/28/19  1:50 AM   Specimen: In/Out Cath Urine  Result Value Ref Range Status   Specimen Description   Final    IN/OUT CATH URINE Performed at [facility] [address], [place], [place] [address]    Special Requests   Final    NONE Performed at [facility], [place] [address] [place], [place] [address]    Culture   Final    NO GROWTH Performed at [facility], [place] [address] [place], [place] [address]    Report Status 12/29/2019 FINAL  Final     Labs: BNP (last 3 results) No results for input(s): BNP in the last 8760 hours. Basic Metabolic Panel: Recent Labs  Lab 12/26/19 1948 12/27/19 2205 12/28/19 0551 12/29/19 0558  NA 132* 133* 133* 133*  K 4.5 4.7 3.9 3.9  CL 96* 97* 102 100  CO2 26 23 21* 25  GLUCOSE 120* 109* 108* 100*  BUN 22 26* 27* 26*  CREATININE 1.54* 1.79* 1.52* 1.47*  CALCIUM 10.1 10.1 9.6 9.7  MG  --   --   --  2.1  PHOS  --   --   --  2.9   Liver Function Tests: Recent Labs  Lab 12/26/19 1948 12/27/19 2205 12/28/19 0551  AST 39 28 25  ALT 35 25 21  ALKPHOS 63 56 50  BILITOT 1.8* 1.5* 0.9  PROT 9.5* 8.2* 7.4  ALBUMIN 5.2* 4.1 3.7   No results for input(s): LIPASE, AMYLASE in the last 168 hours. No results for input(s): AMMONIA in the last 168 hours. CBC: Recent Labs  Lab 12/26/19 1948 12/27/19 2205 12/28/19 1601 12/29/19 0932  WBC 15.9* 14.0* 10.5 6.6  NEUTROABS 12.8* 11.1*  --   --   HGB 14.2 12.5* 11.3* 12.4*  HCT 43.3 37.8* 34.0* 38.7*  MCV 93.5 93.1 91.9 94.4  PLT 159 125* 128* 143*   Cardiac Enzymes: No results for input(s): CKTOTAL, CKMB, CKMBINDEX, TROPONINI in the last 168 hours. BNP: Invalid input(s): POCBNP CBG: No results for input(s): GLUCAP in the last 168 hours. D-Dimer No results for input(s): DDIMER in the last 72 hours. Hgb A1c No results for input(s): HGBA1C in the last 72 hours. Lipid Profile No results  for input(s): CHOL, HDL, LDLCALC, TRIG, CHOLHDL, LDLDIRECT in the last 72 hours. Thyroid function studies No results for input(s): TSH, T4TOTAL, T3FREE, THYROIDAB in the last 72 hours.  Invalid input(s): FREET3 Anemia work up No results for input(s): VITAMINB12, FOLATE, FERRITIN, TIBC, IRON, RETICCTPCT in the last 72 hours. Urinalysis    Component Value Date/Time   COLORURINE YELLOW 12/28/2019 0150   APPEARANCEUR CLEAR 12/28/2019 0150   APPEARANCEUR Cloudy (A) 01/27/2018 1625   LABSPEC 1.011 12/28/2019 0150   PHURINE 6.0 12/28/2019 0150   GLUCOSEU NEGATIVE 12/28/2019 0150   HGBUR MODERATE (A) 12/28/2019 0150   BILIRUBINUR NEGATIVE 12/28/2019 0150   BILIRUBINUR Negative 01/27/2018 1625   KETONESUR 5 (A) 12/28/2019 0150   PROTEINUR 30 (A) 12/28/2019 0150   NITRITE NEGATIVE 12/28/2019 0150   LEUKOCYTESUR SMALL (A) 12/28/2019 0150   Sepsis Labs Invalid input(s): PROCALCITONIN,  WBC,  LACTICIDVEN Microbiology Recent Results (from the past 240 hour(s))  Culture, blood (Routine x 2)     Status: Abnormal   Collection Time: 12/26/19  7:48 PM   Specimen: BLOOD  Result Value Ref Range Status   Specimen Description   Final    BLOOD RIGHT ANTECUBITAL Performed at [facility], [place] [address] [place], [place] [address]    Special Requests   Final    BOTTLES DRAWN AEROBIC AND ANAEROBIC Blood Culture adequate volume Performed at [facility] [address], [place], [place] [address]    Culture  Setup Time   Final    GRAM NEGATIVE RODS ANAEROBIC BOTTLE ONLY Organism ID to follow CRITICAL RESULT CALLED TO, READ BACK BY AND VERIFIED WITH: Bernita Raisin RN [id number] 12/27/19 A  BROWNING Performed at [facility], [address], [place], [place] [address]    Culture (A)  Final    KLEBSIELLA PNEUMONIAE Confirmed Extended Spectrum Beta-Lactamase Producer (ESBL).  In bloodstream infections from ESBL organisms, carbapenems are preferred over  piperacillin/tazobactam. They are shown to have a lower risk of mortality.    Report Status 12/29/2019 FINAL  Final   Organism ID, Bacteria KLEBSIELLA PNEUMONIAE  Final      Susceptibility   Klebsiella pneumoniae - MIC*    AMPICILLIN >=32 RESISTANT Resistant     CEFAZOLIN >=64 RESISTANT Resistant     CEFEPIME >=32 RESISTANT Resistant     CEFTAZIDIME RESISTANT Resistant     CEFTRIAXONE >=64 RESISTANT Resistant     CIPROFLOXACIN 1 SENSITIVE Sensitive     GENTAMICIN <=1 SENSITIVE Sensitive     IMIPENEM <=0.25 SENSITIVE Sensitive     TRIMETH/SULFA >=320 RESISTANT Resistant     AMPICILLIN/SULBACTAM >=32 RESISTANT Resistant     PIP/TAZO 16 SENSITIVE Sensitive     * KLEBSIELLA PNEUMONIAE  Culture, blood (Routine x 2)     Status: None (Preliminary result)   Collection Time: 12/26/19  7:48 PM   Specimen: BLOOD  Result Value Ref Range Status  Specimen Description   Final    BLOOD RIGHT ANTECUBITAL Performed at [facility] [address], [place], [place] [address]    Special Requests   Final    BOTTLES DRAWN AEROBIC AND ANAEROBIC Blood Culture adequate volume Performed at [facility] [address] [place], [place] [address]    Culture   Final    NO GROWTH 3 DAYS Performed at [facility], [place] [address] [place], [place] [address]    Report Status PENDING  Incomplete  Blood Culture ID Panel (Reflexed)     Status: Abnormal   Collection Time: 12/26/19  7:48 PM  Result Value Ref Range Status   Enterococcus species NOT DETECTED NOT DETECTED Final   Listeria monocytogenes NOT DETECTED NOT DETECTED Final   Staphylococcus species NOT DETECTED NOT DETECTED Final   Staphylococcus aureus (BCID) NOT DETECTED NOT DETECTED Final   Streptococcus species NOT DETECTED NOT DETECTED Final   Streptococcus agalactiae NOT DETECTED NOT DETECTED Final   Streptococcus pneumoniae NOT DETECTED NOT DETECTED Final   Streptococcus pyogenes NOT DETECTED NOT  DETECTED Final   Acinetobacter baumannii NOT DETECTED NOT DETECTED Final   Enterobacteriaceae species DETECTED (A) NOT DETECTED Final    Comment: Enterobacteriaceae represent a large family of gram-negative bacteria, not a single organism. CRITICAL RESULT CALLED TO, READ BACK BY AND VERIFIED WITH: Bernita Raisin RN [phone] 12/27/19 A BROWNING    Enterobacter cloacae complex NOT DETECTED NOT DETECTED Final   Escherichia coli NOT DETECTED NOT DETECTED Final   Klebsiella oxytoca NOT DETECTED NOT DETECTED Final   Klebsiella pneumoniae DETECTED (A) NOT DETECTED Final    Comment: CRITICAL RESULT CALLED TO, READ BACK BY AND VERIFIED WITH: Bernita Raisin RN [phone] 12/27/19 A BROWNING    Proteus species NOT DETECTED NOT DETECTED Final   Serratia marcescens NOT DETECTED NOT DETECTED Final   Carbapenem resistance NOT DETECTED NOT DETECTED Final   Haemophilus influenzae NOT DETECTED NOT DETECTED Final   Neisseria meningitidis NOT DETECTED NOT DETECTED Final   Pseudomonas aeruginosa NOT DETECTED NOT DETECTED Final   Candida albicans NOT DETECTED NOT DETECTED Final   Candida glabrata NOT DETECTED NOT DETECTED Final   Candida krusei NOT DETECTED NOT DETECTED Final   Candida parapsilosis NOT DETECTED NOT DETECTED Final   Candida tropicalis NOT DETECTED NOT DETECTED Final    Comment: Performed at [facility], [place] [address] [place], [place] [address]  Urine Culture     Status: Abnormal   Collection Time: 12/26/19 10:16 PM   Specimen: Urine, Clean Catch  Result Value Ref Range Status   Specimen Description   Final    URINE, CLEAN CATCH Performed at [facility], [place] [address], [place], [place] [address]    Special Requests   Final    NONE Performed at [facility], [place] [address], [place],  [address]    Culture (A)  Final    >=100,000 COLONIES/mL KLEBSIELLA PNEUMONIAE Confirmed Extended Spectrum Beta-Lactamase Producer (ESBL).  In bloodstream infections from  ESBL organisms, carbapenems are preferred over piperacillin/tazobactam. They are shown to have a lower risk of mortality.    Report Status 12/29/2019 FINAL  Final   Organism ID, Bacteria KLEBSIELLA PNEUMONIAE (A)  Final      Susceptibility   Klebsiella pneumoniae - MIC*    AMPICILLIN >=32 RESISTANT Resistant     CEFAZOLIN >=64 RESISTANT Resistant     CEFTRIAXONE >=64 RESISTANT Resistant     CIPROFLOXACIN  1 SENSITIVE Sensitive     GENTAMICIN <=1 SENSITIVE Sensitive     IMIPENEM <=0.25 SENSITIVE Sensitive     NITROFURANTOIN 256 RESISTANT Resistant     TRIMETH/SULFA >=320 RESISTANT Resistant     AMPICILLIN/SULBACTAM >=32 RESISTANT Resistant     PIP/TAZO 16 SENSITIVE Sensitive     * >=100,000 COLONIES/mL KLEBSIELLA PNEUMONIAE  SARS Coronavirus 2 by RT PCR (hospital order, performed in [facility] hospital lab) Nasopharyngeal Nasopharyngeal Swab     Status: None   Collection Time: 12/27/19 10:05 PM   Specimen: Nasopharyngeal Swab  Result Value Ref Range Status   SARS Coronavirus 2 NEGATIVE NEGATIVE Final    Comment: (NOTE) SARS-CoV-2 target nucleic acids are NOT DETECTED.  The SARS-CoV-2 RNA is generally detectable in upper and lower respiratory specimens during the acute phase of infection. The lowest concentration of SARS-CoV-2 viral copies this assay can detect is 250 copies / mL. A negative result does not preclude SARS-CoV-2 infection and should not be used as the sole basis for treatment or other patient management decisions.  A negative result may occur with improper specimen collection / handling, submission of specimen other than nasopharyngeal swab, presence of viral mutation(s) within the areas targeted by this assay, and inadequate number of viral copies (<250 copies / mL). A negative result must be combined with clinical observations, patient history, and epidemiological information.  Fact Sheet for Patients:   StrictlyIdeas.no  Fact Sheet for  Healthcare Providers: BankingDealers.co.za  This test is not yet approved or  cleared by the [place] FDA and has been authorized for detection and/or diagnosis of SARS-CoV-2 by FDA under an Emergency Use Authorization (EUA).  This EUA will remain in effect (meaning this test can be used) for the duration of the COVID-19 declaration under Section 564(b)(1) of the Act, 21 U.S.C. section 360bbb-3(b)(1), unless the authorization is terminated or revoked sooner.  Performed at [facility], [place] [address], [place], [place] [address]   Urine culture     Status: None   Collection Time: 12/28/19  1:50 AM   Specimen: In/Out Cath Urine  Result Value Ref Range Status   Specimen Description   Final    IN/OUT CATH URINE Performed at [facility] [address] [place], [place] [address]    Special Requests   Final    NONE Performed at [facility], [place] [address], [place], [place] [address]    Culture   Final    NO GROWTH Performed at [facility], [place] [address], [place], [place] [address]    Report Status 12/29/2019 FINAL  Final     Time coordinating discharge: Over 30 minutes  SIGNED:   Shawna Clamp, MD  [facility] 12/29/2019, 3:03 PM Pager   If 7PM-7AM, please contact night-coverage [email or web address]

## 2019-12-29 NOTE — Plan of Care (Signed)
  Problem: Education: Goal: Knowledge of General Education information will improve Description: Including pain rating scale, medication(s)/side effects and non-pharmacologic comfort measures Outcome: Progressing   Problem: Nutrition: Goal: Adequate nutrition will be maintained Outcome: Progressing   

## 2019-12-31 LAB — CULTURE, BLOOD (ROUTINE X 2)
Culture: NO GROWTH
Special Requests: ADEQUATE

## 2020-01-11 ENCOUNTER — Telehealth: Payer: Self-pay | Admitting: Family Medicine

## 2020-01-11 NOTE — Telephone Encounter (Signed)
Attempted to contact patient to schedule hosp f/u.  Patient scheduled for 8/23 at 1030am

## 2020-01-24 ENCOUNTER — Encounter: Payer: Self-pay | Admitting: Family Medicine

## 2020-01-24 ENCOUNTER — Ambulatory Visit (INDEPENDENT_AMBULATORY_CARE_PROVIDER_SITE_OTHER): Payer: Medicare Other | Admitting: Family Medicine

## 2020-01-24 ENCOUNTER — Other Ambulatory Visit: Payer: Self-pay

## 2020-01-24 VITALS — BP 142/87 | HR 88 | Temp 97.5°F | Ht 66.0 in | Wt 219.4 lb

## 2020-01-24 DIAGNOSIS — M51369 Other intervertebral disc degeneration, lumbar region without mention of lumbar back pain or lower extremity pain: Secondary | ICD-10-CM

## 2020-01-24 DIAGNOSIS — M545 Low back pain, unspecified: Secondary | ICD-10-CM

## 2020-01-24 DIAGNOSIS — Z87440 Personal history of urinary (tract) infections: Secondary | ICD-10-CM | POA: Diagnosis not present

## 2020-01-24 DIAGNOSIS — G8929 Other chronic pain: Secondary | ICD-10-CM | POA: Diagnosis not present

## 2020-01-24 DIAGNOSIS — N1831 Chronic kidney disease, stage 3a: Secondary | ICD-10-CM

## 2020-01-24 DIAGNOSIS — Z09 Encounter for follow-up examination after completed treatment for conditions other than malignant neoplasm: Secondary | ICD-10-CM | POA: Diagnosis not present

## 2020-01-24 DIAGNOSIS — Z905 Acquired absence of kidney: Secondary | ICD-10-CM | POA: Diagnosis not present

## 2020-01-24 DIAGNOSIS — M5136 Other intervertebral disc degeneration, lumbar region: Secondary | ICD-10-CM

## 2020-01-24 DIAGNOSIS — R109 Unspecified abdominal pain: Secondary | ICD-10-CM | POA: Diagnosis not present

## 2020-01-24 LAB — URINALYSIS, COMPLETE
Bilirubin, UA: NEGATIVE
Glucose, UA: NEGATIVE
Ketones, UA: NEGATIVE
Nitrite, UA: NEGATIVE
Protein,UA: NEGATIVE
Specific Gravity, UA: 1.015 (ref 1.005–1.030)
Urobilinogen, Ur: 0.2 mg/dL (ref 0.2–1.0)
pH, UA: 7 (ref 5.0–7.5)

## 2020-01-24 LAB — MICROSCOPIC EXAMINATION

## 2020-01-24 MED ORDER — TRAMADOL HCL 50 MG PO TABS: 50.0000 mg | ORAL_TABLET | Freq: Two times a day (BID) | ORAL | 0 refills | Status: DC | PRN

## 2020-01-24 NOTE — Patient Instructions (Addendum)
TAKE TRAMADOL SPARINGLY.  If you end up needing a refill, please schedule an appointment with me.  NO IBUPROFEN!

## 2020-01-24 NOTE — Progress Notes (Signed)
Subjective: CC: Hospital follow-up PCP: Janora Norlander, DO KKX:FGHWEXH W Billy Rasmussen is a 74 y.o. male presenting to clinic today for:  1.  Hospital follow-up for urosepsis Patient was discharged from the hospital at the end of July.  He had bacteremia and was discharged home with Cipro.  About 2 weeks ago he started having low back pain that is radiating up into the right flank.  No fevers, chills, nausea, vomiting.  Urine output into his bag is normal and has no blood in it.  He just changed out yesterday.  He has chronic low back pain that does not radiate.  No sensory changes in the legs but the right lower extremity sometimes gives out.  He does have history of knee surgery.  No fecal incontinence, saddle anesthesia.  He would like referral to a back doctor about this.  Was previously seen by [facility] orthopedics but would like to have somebody closer if possible. He has been taking ibuprofen and tramadol (this was prescribed at discharge from the hospital).  Does not report any adverse side effects from the tramadol.  He tries to use this as sparingly as possible.  ROS: Per HPI  No Known Allergies Past Medical History:  Diagnosis Date  . Arthritis    knees, shoulders, elbows  . Bladder cancer [facility])    urologist-  dr Junious Silk  . Diverticulosis of colon   . Fibromyalgia   . GERD (gastroesophageal reflux disease)   . History of diverticulitis of colon   . History of gastric ulcer    due to aleve  . Hypertension   . Lower urinary tract symptoms (LUTS)   . OSA (obstructive sleep apnea)    NON-COMPLIANT  CPAP  --- BUT PT USES OXYGEN AT NIGHT 2.5L VIA [place] (PT'S DECISION)  . PONV (postoperative nausea and vomiting)   . Psoriasis   . Sepsis [facility])    january 2021  . Tinnitus    right ear more, has tranmitter in right ear removable at hs  . Wears glasses   . Wears partial dentures     Current Outpatient Medications:  .  docusate sodium (COLACE) 100 MG capsule, Take 200 mg by  mouth daily. , Disp: , Rfl:  .  lisinopril (ZESTRIL) 5 MG tablet, Take 1 tablet (5 mg total) by mouth daily., Disp: 90 tablet, Rfl: 0 .  Multiple Vitamin (MULTIVITAMIN ADULT) TABS, Take 1 tablet by mouth daily., Disp: , Rfl:  .  polyvinyl alcohol (LIQUIFILM TEARS) 1.4 % ophthalmic solution, Place 1 drop into both eyes as needed for dry eyes., Disp: 15 mL, Rfl: 0 .  amLODipine (NORVASC) 10 MG tablet, Take 1 tablet (10 mg total) by mouth daily. (Patient not taking: Reported on 01/24/2020), Disp: 90 tablet, Rfl: 3 .  atorvastatin (LIPITOR) 20 MG tablet, Take 1 tablet (20 mg total) by mouth daily., Disp: 90 tablet, Rfl: 1 .  ciprofloxacin (CIPRO) 500 MG tablet, Take 1 tablet (500 mg total) by mouth every 12 (twelve) hours. (Patient not taking: Reported on 01/24/2020), Disp: 20 tablet, Rfl: 0 .  ferrous sulfate 325 (65 FE) MG tablet, Take 1 tablet (325 mg total) by mouth daily with breakfast. (Patient not taking: Reported on 01/24/2020), Disp: 30 tablet, Rfl: 0 .  OVER THE COUNTER MEDICATION, Take 1 tablet by mouth daily. (Patient not taking: Reported on 01/24/2020), Disp: , Rfl:  Social History   Socioeconomic History  . Marital status: Married    Spouse name: Vickii Chafe  . Number of  children: 2  . Years of education: Not on file  . Highest education level: Associate degree: occupational, Hotel manager, or vocational program  Occupational History  . 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 Smoking status: Former Smoker    Packs/day: 2.00    Years: 40.00    Pack years: 80.00    Types: Cigarettes    Quit date: 06/01/1997    Years since quitting: 22.6  . Smokeless tobacco: Never Used  Vaping Use  . Vaping Use: Never used  Substance and Sexual Activity  . Alcohol use: No  . Drug use: No  . Sexual activity: Not Currently  Other Topics Concern  . Not on file  Social History Narrative  . Not on file   Social Determinants of Health   Financial Resource Strain:   .  Difficulty of Paying Living Expenses: Not on file  Food Insecurity:   . Worried About Charity fundraiser in the Last Year: Not on file  . Ran Out of Food in the Last Year: Not on file  Transportation Needs:   . Lack of Transportation (Medical): Not on file  . Lack of Transportation (Non-Medical): Not on file  Physical Activity:   . Days of Exercise per Week: Not on file  . Minutes of Exercise per Session: Not on file  Stress:   . Feeling of Stress : Not on file  Social Connections:   . Frequency of Communication with Friends and Family: Not on file  . Frequency of Social Gatherings with Friends and Family: Not on file  . Attends Religious Services: Not on file  . Active Member of Clubs or Organizations: Not on file  . Attends Archivist Meetings: Not on file  . Marital Status: Not on file  Intimate Partner Violence:   . Fear of Current or Ex-Partner: Not on file  . Emotionally Abused: Not on file  . Physically Abused: Not on file  . Sexually Abused: Not on file   Family History  Problem Relation Age of Onset  . Alzheimer's disease Mother   . Heart attack Father   . Heart disease Father   . Irregular heart beat Brother        DEFIB.  . Congestive Heart Failure Brother   . Diabetes Daughter     Objective: Office vital signs reviewed. BP (!) 142/87   Pulse 88   Temp (!) 97.5 F (36.4 C)   Ht 5\' 6"  (1.676 m)   Wt 219 lb 6.4 oz (99.5 kg)   SpO2 95%   BMI 35.41 kg/m   Physical Examination:  General: Awake, alert, well appearing male, No acute distress HEENT: Normal; sclera white.  Moist mucous membranes Cardio: regular rate   Pulm: Normal work of breathing on room air GU: Clean stoma.  Yellow, clear urine noted in the urostomy bag Extremities: warm, well perfused, No edema, cyanosis or clubbing; +2 pulses bilaterally MSK: minimally antalgic gait and station; ambulating independently Skin: dry; intact; no rashes or lesions Neuro alert and  oriented.  Assessment/ Plan: 74 y.o. male   1. Hospital discharge follow-up  2. Chronic bilateral low back pain without sciatica I have renewed his tramadol for as needed use.  Advised against use of NSAIDs given history of nephrectomy and CKD.  National narcotic database was reviewed and there were no red flags.  If he has ongoing need for this we need to complete a controlled substance contract and  UDS - Ambulatory referral to Orthopedic Surgery - traMADol (ULTRAM) 50 MG tablet; Take 1 tablet (50 mg total) by mouth every 12 (twelve) hours as needed (for severe pain).  Dispense: 60 tablet; Refill: 0  3. DDD (degenerative disc disease), lumbar - Ambulatory referral to Orthopedic Surgery - traMADol (ULTRAM) 50 MG tablet; Take 1 tablet (50 mg total) by mouth every 12 (twelve) hours as needed (for severe pain).  Dispense: 60 tablet; Refill: 0  4. Stage 3a chronic kidney disease Again advised against NSAIDs.  Repeat BMP given recent use of NSAIDs - Basic Metabolic Panel  5. History of nephrectomy - Basic Metabolic Panel  6. Recent urinary tract infection Recheck urinalysis and urine culture given intermittent pain radiating to the flank on the right, which is where his remaining kidney is.  I have a suspicion that he will have some bacteria in the urine specimen given use of urostomy bag.  However, we will send for culture.  Of note urine specimen was negative for nitrites and showed 6-10 white blood cells and many bacteria. - Urinalysis, Complete - Urine Culture  7. Flank pain - Urinalysis, Complete - Urine Culture   No orders of the defined types were placed in this encounter.  No orders of the defined types were placed in this encounter.    Janora Norlander, DO [place] [phone]

## 2020-01-25 LAB — CBC WITH DIFFERENTIAL/PLATELET
Basophils Absolute: 0 10*3/uL (ref 0.0–0.2)
Basos: 1 %
EOS (ABSOLUTE): 0.3 10*3/uL (ref 0.0–0.4)
Eos: 5 %
Hematocrit: 42.3 % (ref 37.5–51.0)
Hemoglobin: 14.5 g/dL (ref 13.0–17.7)
Immature Grans (Abs): 0 10*3/uL (ref 0.0–0.1)
Immature Granulocytes: 0 %
Lymphocytes Absolute: 1.4 10*3/uL (ref 0.7–3.1)
Lymphs: 23 %
MCH: 31 pg (ref 26.6–33.0)
MCHC: 34.3 g/dL (ref 31.5–35.7)
MCV: 90 fL (ref 79–97)
Monocytes Absolute: 0.6 10*3/uL (ref 0.1–0.9)
Monocytes: 9 %
Neutrophils Absolute: 3.8 10*3/uL (ref 1.4–7.0)
Neutrophils: 62 %
Platelets: 142 10*3/uL — ABNORMAL LOW (ref 150–450)
RBC: 4.68 x10E6/uL (ref 4.14–5.80)
RDW: 14.1 % (ref 11.6–15.4)
WBC: 6.1 10*3/uL (ref 3.4–10.8)

## 2020-01-25 LAB — BASIC METABOLIC PANEL
BUN/Creatinine Ratio: 13 (ref 10–24)
BUN: 19 mg/dL (ref 8–27)
CO2: 29 mmol/L (ref 20–29)
Calcium: 10.2 mg/dL (ref 8.6–10.2)
Chloride: 99 mmol/L (ref 96–106)
Creatinine, Ser: 1.44 mg/dL — ABNORMAL HIGH (ref 0.76–1.27)
GFR calc Af Amer: 56 mL/min/{1.73_m2} — ABNORMAL LOW (ref >59)
GFR calc non Af Amer: 48 mL/min/{1.73_m2} — ABNORMAL LOW (ref >59)
Glucose: 105 mg/dL — ABNORMAL HIGH (ref 65–99)
Potassium: 4.9 mmol/L (ref 3.5–5.2)
Sodium: 138 mmol/L (ref 134–144)

## 2020-01-27 LAB — URINE CULTURE

## 2020-01-30 ENCOUNTER — Telehealth: Payer: Self-pay | Admitting: Family Medicine

## 2020-01-30 NOTE — Telephone Encounter (Signed)
REFERRAL REQUEST Telephone Note  Have you been seen at our office for this problem? Yes (Advise that they may need an appointment with their PCP before a referral can be done)  Reason for Referral: back pain Referral discussed with patient: yes Best contact number of patient for referral team:  [phone] or [phone]  Has patient been seen by a specialist for this issue before: No Patient provider preference for referral:  Patient location preference for referral:    Patient notified that referrals can take up to a week or longer to process. If they haven't heard anything within a week they should call back and speak with the referral department.   Back Doctor that is in [place] & he comes to [place].  Please call pt bc he is in a lot of pain.

## 2020-02-01 DIAGNOSIS — M545 Low back pain: Secondary | ICD-10-CM | POA: Diagnosis not present

## 2020-02-01 DIAGNOSIS — M546 Pain in thoracic spine: Secondary | ICD-10-CM | POA: Diagnosis not present

## 2020-02-01 DIAGNOSIS — M542 Cervicalgia: Secondary | ICD-10-CM | POA: Diagnosis not present

## 2020-02-14 ENCOUNTER — Ambulatory Visit: Payer: Medicare Other | Admitting: Orthopaedic Surgery

## 2020-02-15 ENCOUNTER — Ambulatory Visit: Payer: Medicare Other | Admitting: Orthopaedic Surgery

## 2020-02-20 ENCOUNTER — Other Ambulatory Visit: Payer: Self-pay | Admitting: Family Medicine

## 2020-03-07 ENCOUNTER — Ambulatory Visit: Payer: Self-pay

## 2020-03-07 ENCOUNTER — Ambulatory Visit (INDEPENDENT_AMBULATORY_CARE_PROVIDER_SITE_OTHER): Payer: Medicare Other | Admitting: Orthopaedic Surgery

## 2020-03-07 ENCOUNTER — Other Ambulatory Visit: Payer: Self-pay

## 2020-03-07 DIAGNOSIS — M5441 Lumbago with sciatica, right side: Secondary | ICD-10-CM | POA: Diagnosis not present

## 2020-03-07 DIAGNOSIS — G8929 Other chronic pain: Secondary | ICD-10-CM

## 2020-03-07 DIAGNOSIS — M542 Cervicalgia: Secondary | ICD-10-CM | POA: Diagnosis not present

## 2020-03-07 NOTE — Addendum Note (Signed)
Addended byLaurann Montana on: 03/07/2020 02:17 PM   Modules accepted: Orders

## 2020-03-07 NOTE — Progress Notes (Signed)
Office Visit Note   Patient: Billy Rasmussen           Date of Birth: 04-22-1946           MRN: [id number] Visit Date: 03/07/2020              Requested by: Janora Norlander, DO [place],  [place] [address] PCP: Janora Norlander, DO   Assessment & Plan: Visit Diagnoses:  1. Neck pain   2. Chronic midline low back pain with right-sided sciatica     Plan: Previous abdominal CT scan 2020 as part of his cancer work-up in October showed multilevel facet arthropathy and lumbar spine with endplate spurring to space narrowing some Schmorl's nodes at L2-3 level and multilevel facet arthropathy.  His pain is more recent trouble with activities of daily living.  We will obtain lumbar CT scan and office follow-up after CT scan for review.  He cannot take anti-inflammatories due to only having 1 kidney.  He is used tramadol without relief.  He has been through exercise programs in the past.  Previous epidurals in the past at [facility].  Follow-Up Instructions: No follow-ups on file.   Orders:  No orders of the defined types were placed in this encounter.  No orders of the defined types were placed in this encounter.     Procedures: No procedures performed   Clinical Data: No additional findings.   Subjective: Chief Complaint  Patient presents with   Neck - Pain   Middle Back - Pain   Lower Back - Pain    HPI 74 year old male with had bilateral total knee arthroplasties with postop left knee infection requiring antibiotic beads before revision.  Has had bilateral total shoulder arthroplasties and is here with problems with low back pain and also neck pain.  Previous radiographs showed significant spondylosis lumbar spine and also cervical spine.  Patient has a piece of metal just below his left eye that reacts to a magnet and has not been able to get an MRI scan.  Past history of bladder tumor requiring bladder resection.  He had a stent initially had  recurrent pyelonephritis ultimately only has 1 functioning kidney and has ileostomy bag.  Patient states currently his low back pain is worse than his neck pain.  He only has about 30% rotation of his neck.  Problems with lumbar flexion extension he states many times his wife has to put socks on for him since he cannot bend down.  At times he needs help to get out of a chair.  He is noted some tenderness near the peroneal nerve at the fibular neck on the right leg.  He has some problems with his knees when he has some toe walks but has not noticed any weakness in the calf muscles.  Review of Systems Negative history for gout.  Positive for bladder cancer post resection 2020 with cystoprostatectomy.  Total joint replacements listed above otherwise noncontributory.  Objective: Vital Signs: There were no vitals taken for this visit.  Physical Exam Constitutional:      Appearance: He is well-developed.  HENT:     Head: Normocephalic and atraumatic.  Eyes:     Pupils: Pupils are equal, round, and reactive to light.  Neck:     Thyroid: No thyromegaly.     Trachea: No tracheal deviation.  Cardiovascular:     Rate and Rhythm: Normal rate.  Pulmonary:     Effort: Pulmonary effort is normal.  Breath sounds: No wheezing.  Abdominal:     General: Bowel sounds are normal.     Palpations: Abdomen is soft.  Skin:    General: Skin is warm and dry.     Capillary Refill: Capillary refill takes less than 2 seconds.  Neurological:     Mental Status: He is alert and oriented to person, place, and time.  Psychiatric:        Behavior: Behavior normal.        Thought Content: Thought content normal.        Judgment: Judgment normal.     Ortho Exam bilateral brachial plexus tenderness.  Well-healed anterior total shoulder incisions total knee incisions.  He uses arms to get from sitting to standing.  Cervical rotation is only 25 to 30% of normal he has brachial plexus tenderness.  Intact station his  hands no thenar or hypothenar atrophy.  Gastroc strength is good he can heel and toe walk EHL is strong. Specialty Comments:  No specialty comments available.  Imaging: No results found.   PMFS History: Patient Active Problem List   Diagnosis Date Noted   Bacteremia due to Klebsiella pneumoniae 12/28/2019   S/P radical cystoprostatectomy 12/28/2019   S/p nephrectomy 12/28/2019   Renal mass 07/15/2019   Nonfunctioning kidney 07/14/2019   UTI (urinary tract infection) 06/25/2019   OSA (obstructive sleep apnea) 05/12/2019   Fever 04/09/2019   SIRS (systemic inflammatory response syndrome) ([place]) 04/09/2019   Sepsis due to urinary tract infection ([place]) 02/17/2019   Abnormal renal function 01/20/2019   Enterococcus UTI    Urinary tract infection without hematuria    Acute pyelonephritis    Gastroesophageal reflux disease    Sepsis ([place]) 12/22/2018   AKI (acute kidney injury) ([place]) 12/22/2018   Bladder cancer ([place]) 11/09/2018   BCG cystitis 02/02/2018   Acute cystitis with hematuria 10/02/2016   Pure hypercholesterolemia 10/02/2016   Malignant neoplasm of urinary bladder ([place]) 10/02/2016   Lipoma 09/30/2016   Obese 03/31/2016   S/P right TKA 03/30/2016   S/P knee replacement 03/30/2016   HTN (hypertension) 02/25/2016   Healthcare maintenance 02/25/2016   Obesity (BMI 30-39.9) 01/07/2016   S/P shoulder replacement 07/20/2013   Shoulder arthritis 04/07/2013   CELLULITIS, KNEE, LEFT 08/26/2006   Past Medical History:  Diagnosis Date   Arthritis    knees, shoulders, elbows   Bladder cancer [facility])    urologist-  dr eskridge   Diverticulosis of colon    Fibromyalgia    GERD (gastroesophageal reflux disease)    History of diverticulitis of colon    History of gastric ulcer    due to aleve   Hypertension    Lower urinary tract symptoms (LUTS)    OSA (obstructive sleep apnea)    NON-COMPLIANT  CPAP  --- BUT PT USES OXYGEN AT NIGHT  2.5L VIA [place] (PT'S DECISION)   PONV (postoperative nausea and vomiting)    Psoriasis    Sepsis ([place])    january 2021   Tinnitus    right ear more, has tranmitter in right ear removable at hs   Wears glasses    Wears partial dentures     Family History  Problem Relation Age of Onset   Alzheimer's disease Mother    Heart attack Father    Heart disease Father    Irregular heart beat Brother        DEFIB.   Congestive Heart Failure Brother    Diabetes Daughter     Past Surgical  History:  Procedure Laterality Date   APPENDECTOMY     COLECTOMY W/ COLOSTOMY  1996   W/   APPENDECTOMY   COLONOSCOPY N/A 05/11/2018   Procedure: COLONOSCOPY;  Surgeon: Daneil Dolin, MD;  [facility];  Service: Endoscopy;  Laterality: N/A;  9:30   COLOSTOMY TAKEDOWN  1996   CYSTOSCOPY WITH BIOPSY N/A 12/05/2013   Procedure: CYSTO BLADDER BIOPSY AND FULGERATION;  Surgeon: Festus Aloe, MD;  [facility];  Service: Urology;  Laterality: N/A;   CYSTOSCOPY WITH BIOPSY Bilateral 11/13/2014   Procedure: CYSTOSCOPY WITH  BLADDER BIOPSY FULGERATION AND BILATERAL RETROGRADE PYELOGRAMS;  Surgeon: Festus Aloe, MD;  Location: [facility];  Service: Urology;  Laterality: Bilateral;   CYSTOSCOPY WITH FULGERATION N/A 01/18/2018   Procedure: CYSTOSCOPY WITH FULGERATION/ BLADDER BIOPSY;  Surgeon: Festus Aloe, MD;  Location: [facility];  Service: Urology;  Laterality: N/A;   CYSTOSCOPY WITH INJECTION N/A 11/09/2018   Procedure: CYSTOSCOPY WITH INJECTION OF INDOCYANINE GREEN DYE;  Surgeon: Alexis Frock, MD;  [facility];  Service: Urology;  Laterality: N/A;   CYSTOSCOPY WITH INSERTION OF UROLIFT N/A 01/18/2018   Procedure: CYSTOSCOPY WITH INSERTION OF UROLIFT;  Surgeon: Festus Aloe, MD;  Location[facility];  Service: Urology;  Laterality: N/A;   CYSTOSCOPY/URETEROSCOPY/HOLMIUM LASER Left 02/17/2019     Procedure: URETEROSCOPY WITH BALLOON DILATION  AND NEPHROSTOGRAM;  Surgeon: Alexis Frock, MD;  Location: [facility];  Service: Urology;  Laterality: Left;  1 HR   EXCISION RIGHT UPPER ARM LIPOMA  2005   HEMORROIDECTOMY     INGUINAL HERNIA REPAIR Left 1984   IR NEPHROSTOMY PLACEMENT LEFT  01/05/2019   KNEE ARTHROSCOPY Left X3  LAST ONE  2002   ORIF LEFT HUMEROUS FX  1976   POLYPECTOMY  05/11/2018   Procedure: POLYPECTOMY;  Surgeon: Daneil Dolin, MD;  [facility];  Service: Endoscopy;;   PROSTATE SURGERY     ROBOT ASSISTED LAPAROSCOPIC NEPHRECTOMY Left 07/14/2019   Procedure: XI ROBOTIC ASSISTED LAPAROSCOPIC RETROPERITONEAL NEPHRECTOMY;  Surgeon: Alexis Frock, MD;  [facility];  Service: Urology;  Laterality: Left;  3 HRS   TONSILLECTOMY  AS CHILD   TOTAL KNEE ARTHROPLASTY Left 2006   REVISION 2007  (AFTER I & D WITH ANTIBIOTIC SPACER PROCEDURE FOR STEPH INFECTION)   TOTAL KNEE ARTHROPLASTY Right 03/30/2016   Procedure: RIGHT TOTAL KNEE ARTHROPLASTY;  Surgeon: Paralee Cancel, MD;  [facility];  Service: Orthopedics;  Laterality: Right;   TOTAL SHOULDER ARTHROPLASTY Right 04/06/2013   Procedure: RIGHT TOTAL SHOULDER ARTHROPLASTY;  Surgeon: Marin Shutter, MD;  Location: [place];  Service: Orthopedics;  Laterality: Right;   TOTAL SHOULDER ARTHROPLASTY Left 07/20/2013   Procedure: LEFT TOTAL SHOULDER ARTHROPLASTY;  Surgeon: Marin Shutter, MD;  Location: [place];  Service: Orthopedics;  Laterality: Left;   TRANSURETHRAL RESECTION OF BLADDER TUMOR N/A 11/12/2015   Procedure: TRANSURETHRAL RESECTION OF BLADDER TUMOR (TURBT);  Surgeon: Festus Aloe, MD;  Location[facility];  Service: Urology;  Laterality: N/A;   TRANSURETHRAL RESECTION OF BLADDER TUMOR WITH GYRUS (TURBT-GYRUS) N/A 12/05/2013   Procedure: TRANSURETHRAL RESECTION OF BLADDER TUMOR WITH GYRUS (TURBT-GYRUS);  Surgeon: Festus Aloe, MD;  Location: [facility];  Service: Urology;  Laterality: N/A;   Social History   Occupational History   Occupation: retired    Comment: [employer], 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

## 2020-03-13 ENCOUNTER — Other Ambulatory Visit: Payer: Self-pay | Admitting: Family Medicine

## 2020-03-13 NOTE — Telephone Encounter (Signed)
[place]. NTBS 30 days given 02/19/20

## 2020-03-14 DIAGNOSIS — I700 Atherosclerosis of aorta: Secondary | ICD-10-CM | POA: Diagnosis not present

## 2020-03-14 DIAGNOSIS — M47816 Spondylosis without myelopathy or radiculopathy, lumbar region: Secondary | ICD-10-CM | POA: Diagnosis not present

## 2020-03-14 DIAGNOSIS — M545 Low back pain, unspecified: Secondary | ICD-10-CM | POA: Diagnosis not present

## 2020-03-14 DIAGNOSIS — M9973 Connective tissue and disc stenosis of intervertebral foramina of lumbar region: Secondary | ICD-10-CM | POA: Diagnosis not present

## 2020-03-21 ENCOUNTER — Ambulatory Visit (INDEPENDENT_AMBULATORY_CARE_PROVIDER_SITE_OTHER): Payer: Medicare Other | Admitting: Orthopaedic Surgery

## 2020-03-21 ENCOUNTER — Encounter: Payer: Self-pay | Admitting: Orthopaedic Surgery

## 2020-03-21 ENCOUNTER — Ambulatory Visit: Payer: Self-pay

## 2020-03-21 ENCOUNTER — Other Ambulatory Visit: Payer: Self-pay

## 2020-03-21 ENCOUNTER — Inpatient Hospital Stay (HOSPITAL_COMMUNITY)
Admission: EM | Admit: 2020-03-21 | Discharge: 2020-03-26 | DRG: 872 | Disposition: A | Discharge status: Home / Self Care | Payer: Medicare Other | Attending: Family Medicine | Admitting: Family Medicine

## 2020-03-21 ENCOUNTER — Encounter (HOSPITAL_COMMUNITY): Payer: Self-pay | Admitting: *Deleted

## 2020-03-21 VITALS — BP 144/99 | HR 101 | Ht 66.0 in | Wt 219.0 lb

## 2020-03-21 DIAGNOSIS — Z8249 Family history of ischemic heart disease and other diseases of the circulatory system: Secondary | ICD-10-CM

## 2020-03-21 DIAGNOSIS — Z905 Acquired absence of kidney: Secondary | ICD-10-CM

## 2020-03-21 DIAGNOSIS — N390 Urinary tract infection, site not specified: Secondary | ICD-10-CM | POA: Diagnosis not present

## 2020-03-21 DIAGNOSIS — Z8551 Personal history of malignant neoplasm of bladder: Secondary | ICD-10-CM | POA: Diagnosis not present

## 2020-03-21 DIAGNOSIS — Z87440 Personal history of urinary (tract) infections: Secondary | ICD-10-CM

## 2020-03-21 DIAGNOSIS — Z9079 Acquired absence of other genital organ(s): Secondary | ICD-10-CM | POA: Diagnosis not present

## 2020-03-21 DIAGNOSIS — Z833 Family history of diabetes mellitus: Secondary | ICD-10-CM

## 2020-03-21 DIAGNOSIS — N179 Acute kidney failure, unspecified: Secondary | ICD-10-CM | POA: Diagnosis present

## 2020-03-21 DIAGNOSIS — R319 Hematuria, unspecified: Secondary | ICD-10-CM | POA: Diagnosis present

## 2020-03-21 DIAGNOSIS — Z96611 Presence of right artificial shoulder joint: Secondary | ICD-10-CM | POA: Diagnosis present

## 2020-03-21 DIAGNOSIS — E871 Hypo-osmolality and hyponatremia: Secondary | ICD-10-CM | POA: Diagnosis present

## 2020-03-21 DIAGNOSIS — Z87891 Personal history of nicotine dependence: Secondary | ICD-10-CM

## 2020-03-21 DIAGNOSIS — M549 Dorsalgia, unspecified: Secondary | ICD-10-CM | POA: Diagnosis present

## 2020-03-21 DIAGNOSIS — M542 Cervicalgia: Secondary | ICD-10-CM

## 2020-03-21 DIAGNOSIS — Z8711 Personal history of peptic ulcer disease: Secondary | ICD-10-CM | POA: Diagnosis not present

## 2020-03-21 DIAGNOSIS — Z1612 Extended spectrum beta lactamase (ESBL) resistance: Secondary | ICD-10-CM

## 2020-03-21 DIAGNOSIS — A4189 Other specified sepsis: Principal | ICD-10-CM | POA: Diagnosis present

## 2020-03-21 DIAGNOSIS — A499 Bacterial infection, unspecified: Secondary | ICD-10-CM

## 2020-03-21 DIAGNOSIS — N3000 Acute cystitis without hematuria: Secondary | ICD-10-CM | POA: Diagnosis not present

## 2020-03-21 DIAGNOSIS — R6520 Severe sepsis without septic shock: Secondary | ICD-10-CM | POA: Diagnosis present

## 2020-03-21 DIAGNOSIS — Z820 Family history of epilepsy and other diseases of the nervous system: Secondary | ICD-10-CM

## 2020-03-21 DIAGNOSIS — Z79899 Other long term (current) drug therapy: Secondary | ICD-10-CM

## 2020-03-21 DIAGNOSIS — C679 Malignant neoplasm of bladder, unspecified: Secondary | ICD-10-CM | POA: Diagnosis present

## 2020-03-21 DIAGNOSIS — Z936 Other artificial openings of urinary tract status: Secondary | ICD-10-CM | POA: Diagnosis not present

## 2020-03-21 DIAGNOSIS — Z96653 Presence of artificial knee joint, bilateral: Secondary | ICD-10-CM | POA: Diagnosis present

## 2020-03-21 DIAGNOSIS — Z20822 Contact with and (suspected) exposure to covid-19: Secondary | ICD-10-CM | POA: Diagnosis present

## 2020-03-21 DIAGNOSIS — R531 Weakness: Secondary | ICD-10-CM | POA: Diagnosis not present

## 2020-03-21 DIAGNOSIS — M48061 Spinal stenosis, lumbar region without neurogenic claudication: Secondary | ICD-10-CM | POA: Diagnosis not present

## 2020-03-21 DIAGNOSIS — Z7952 Long term (current) use of systemic steroids: Secondary | ICD-10-CM

## 2020-03-21 DIAGNOSIS — I10 Essential (primary) hypertension: Secondary | ICD-10-CM | POA: Diagnosis present

## 2020-03-21 DIAGNOSIS — Z96612 Presence of left artificial shoulder joint: Secondary | ICD-10-CM | POA: Diagnosis present

## 2020-03-21 LAB — CBC WITH DIFFERENTIAL/PLATELET
Abs Immature Granulocytes: 0.04 10*3/uL (ref 0.00–0.07)
Basophils Absolute: 0 10*3/uL (ref 0.0–0.1)
Basophils Relative: 0 %
Eosinophils Absolute: 0.1 10*3/uL (ref 0.0–0.5)
Eosinophils Relative: 1 %
HCT: 39.6 % (ref 39.0–52.0)
Hemoglobin: 13.5 g/dL (ref 13.0–17.0)
Immature Granulocytes: 0 %
Lymphocytes Relative: 10 %
Lymphs Abs: 1 10*3/uL (ref 0.7–4.0)
MCH: 30.4 pg (ref 26.0–34.0)
MCHC: 34.1 g/dL (ref 30.0–36.0)
MCV: 89.2 fL (ref 80.0–100.0)
Monocytes Absolute: 1.2 10*3/uL — ABNORMAL HIGH (ref 0.1–1.0)
Monocytes Relative: 12 %
Neutro Abs: 8.2 10*3/uL — ABNORMAL HIGH (ref 1.7–7.7)
Neutrophils Relative %: 77 %
Platelets: 150 10*3/uL (ref 150–400)
RBC: 4.44 MIL/uL (ref 4.22–5.81)
RDW: 14.5 % (ref 11.5–15.5)
WBC: 10.6 10*3/uL — ABNORMAL HIGH (ref 4.0–10.5)
nRBC: 0 % (ref 0.0–0.2)

## 2020-03-21 LAB — COMPREHENSIVE METABOLIC PANEL
ALT: 26 U/L (ref 0–44)
AST: 32 U/L (ref 15–41)
Albumin: 4.4 g/dL (ref 3.5–5.0)
Alkaline Phosphatase: 53 U/L (ref 38–126)
Anion gap: 8 (ref 5–15)
BUN: 21 mg/dL (ref 8–23)
CO2: 22 mmol/L (ref 22–32)
Calcium: 9.6 mg/dL (ref 8.9–10.3)
Chloride: 99 mmol/L (ref 98–111)
Creatinine, Ser: 1.46 mg/dL — ABNORMAL HIGH (ref 0.61–1.24)
GFR, Estimated: 51 mL/min — ABNORMAL LOW (ref >60)
Glucose, Bld: 122 mg/dL — ABNORMAL HIGH (ref 70–99)
Potassium: 4.2 mmol/L (ref 3.5–5.1)
Sodium: 129 mmol/L — ABNORMAL LOW (ref 135–145)
Total Bilirubin: 1.2 mg/dL (ref 0.3–1.2)
Total Protein: 8 g/dL (ref 6.5–8.1)

## 2020-03-21 LAB — RESPIRATORY PANEL BY RT PCR (FLU A&B, COVID)
Influenza A by PCR: NEGATIVE
Influenza B by PCR: NEGATIVE
SARS Coronavirus 2 by RT PCR: NEGATIVE

## 2020-03-21 LAB — URINALYSIS, ROUTINE W REFLEX MICROSCOPIC
Bilirubin Urine: NEGATIVE
Glucose, UA: NEGATIVE mg/dL
Ketones, ur: NEGATIVE mg/dL
Nitrite: POSITIVE — AB
Protein, ur: NEGATIVE mg/dL
Specific Gravity, Urine: 1.013 (ref 1.005–1.030)
pH: 6 (ref 5.0–8.0)

## 2020-03-21 LAB — LACTIC ACID, PLASMA: Lactic Acid, Venous: 1.4 mmol/L (ref 0.5–1.9)

## 2020-03-21 MED ORDER — SODIUM CHLORIDE 0.9 % IV BOLUS
500.0000 mL | Freq: Once | INTRAVENOUS | Status: AC
  Administered 2020-03-21: 500 mL via INTRAVENOUS

## 2020-03-21 MED ORDER — ONDANSETRON HCL 4 MG/2ML IJ SOLN: 4.0000 mg | Freq: Four times a day (QID) | INTRAMUSCULAR | Status: DC | PRN

## 2020-03-21 MED ORDER — POLYETHYLENE GLYCOL 3350 17 G PO PACK
17.0000 g | PACK | Freq: Every day | ORAL | Status: DC | PRN
  Administered 2020-03-23: 17 g via ORAL
  Filled 2020-03-21: qty 1

## 2020-03-21 MED ORDER — SODIUM CHLORIDE 0.9 % IV SOLN
1.0000 g | Freq: Once | INTRAVENOUS | Status: AC
  Administered 2020-03-21: 1 g via INTRAVENOUS
  Filled 2020-03-21: qty 10

## 2020-03-21 MED ORDER — SODIUM CHLORIDE 0.9 % IV SOLN
1.0000 g | Freq: Three times a day (TID) | INTRAVENOUS | Status: DC
  Administered 2020-03-21 – 2020-03-25 (×11): 1 g via INTRAVENOUS
  Filled 2020-03-21 (×11): qty 1

## 2020-03-21 MED ORDER — ACETAMINOPHEN 650 MG RE SUPP: 650.0000 mg | Freq: Four times a day (QID) | RECTAL | Status: DC | PRN

## 2020-03-21 MED ORDER — ACETAMINOPHEN 325 MG PO TABS
650.0000 mg | ORAL_TABLET | Freq: Four times a day (QID) | ORAL | Status: DC | PRN
  Administered 2020-03-22 – 2020-03-25 (×9): 650 mg via ORAL
  Filled 2020-03-21 (×9): qty 2

## 2020-03-21 MED ORDER — ACETAMINOPHEN 325 MG PO TABS
650.0000 mg | ORAL_TABLET | Freq: Once | ORAL | Status: AC
  Administered 2020-03-21: 650 mg via ORAL
  Filled 2020-03-21: qty 2

## 2020-03-21 MED ORDER — SODIUM CHLORIDE 0.9 % IV SOLN: INTRAVENOUS | Status: AC

## 2020-03-21 MED ORDER — ONDANSETRON HCL 4 MG PO TABS: 4.0000 mg | ORAL_TABLET | Freq: Four times a day (QID) | ORAL | Status: DC | PRN

## 2020-03-21 MED ORDER — ENOXAPARIN SODIUM 40 MG/0.4ML ~~LOC~~ SOLN
40.0000 mg | SUBCUTANEOUS | Status: DC
  Administered 2020-03-21 – 2020-03-25 (×3): 40 mg via SUBCUTANEOUS
  Filled 2020-03-21 (×5): qty 0.4

## 2020-03-21 MED ORDER — LISINOPRIL 5 MG PO TABS
5.0000 mg | ORAL_TABLET | Freq: Every day | ORAL | Status: DC
  Administered 2020-03-22 – 2020-03-26 (×5): 5 mg via ORAL
  Filled 2020-03-21 (×5): qty 1

## 2020-03-21 MED ORDER — INFLUENZA VAC A&B SA ADJ QUAD 0.5 ML IM PRSY: 0.5000 mL | PREFILLED_SYRINGE | INTRAMUSCULAR | Status: DC

## 2020-03-21 NOTE — H&P (Signed)
History and Physical    Billy Rasmussen:[id number] DOB: 26-May-1946 DOA: 03/21/2020  PCP: Janora Norlander, DO   Patient coming from: Home  I have personally briefly reviewed patient's old medical records in [place]  Chief Complaint: fevers  HPI: Billy Rasmussen is a 74 y.o. male with medical history significant for hypertension, bladder cancer, s/p nephrectomy. Patient presented to the ED with complaints of fevers that started last night.  Patient has a urostomy.  He has a mild cough from allergies , no difficulty breathing.  No vomiting, no loose stools no abdominal pain. Patient has had prior UTIs in the past and this feels similar.  ED Course: Temperature 100.6.  Tachycardic to 110.  Blood pressure 131/89.  WBC 10.6.  Sodium 129.  UA positive nitrite positive leukocytes rare bacteria 11-20 WBCs.  Normal lactic acid 1.4.  IV ceftriaxone given in ED. COVID Test negative.  Hospitalist to admit for further evaluation and management.  Review of Systems: As per HPI all other systems reviewed and negative.  Past Medical History:  Diagnosis Date  . Arthritis    knees, shoulders, elbows  . Bladder cancer [facility])    urologist-  dr Junious Silk  . Diverticulosis of colon   . Fibromyalgia   . GERD (gastroesophageal reflux disease)   . History of diverticulitis of colon   . History of gastric ulcer    due to aleve  . Hypertension   . Lower urinary tract symptoms (LUTS)   . OSA (obstructive sleep apnea)    NON-COMPLIANT  CPAP  --- BUT PT USES OXYGEN AT NIGHT 2.5L VIA [place] (PT'S DECISION)  . PONV (postoperative nausea and vomiting)   . Psoriasis   . Sepsis [facility])    january 2021  . Tinnitus    right ear more, has tranmitter in right ear removable at hs  . Wears glasses   . Wears partial dentures     Past Surgical History:  Procedure Laterality Date  . APPENDECTOMY    . COLECTOMY W/ COLOSTOMY  1996   W/   APPENDECTOMY  . COLONOSCOPY N/A 05/11/2018   Procedure:  COLONOSCOPY;  Surgeon: Daneil Dolin, MD;  [facility];  Service: Endoscopy;  Laterality: N/A;  9:30  . COLOSTOMY TAKEDOWN  1996  . CYSTOSCOPY WITH BIOPSY N/A 12/05/2013   Procedure: CYSTO BLADDER BIOPSY AND FULGERATION;  Surgeon: Festus Aloe, MD;  Location[facility];  Service: Urology;  Laterality: N/A;  . CYSTOSCOPY WITH BIOPSY Bilateral 11/13/2014   Procedure: CYSTOSCOPY WITH  BLADDER BIOPSY FULGERATION AND BILATERAL RETROGRADE PYELOGRAMS;  Surgeon: Festus Aloe, MD;  Location: [facility];  Service: Urology;  Laterality: Bilateral;  . CYSTOSCOPY WITH FULGERATION N/A 01/18/2018   Procedure: CYSTOSCOPY WITH FULGERATION/ BLADDER BIOPSY;  Surgeon: Festus Aloe, MD;  Location[facility];  Service: Urology;  Laterality: N/A;  . CYSTOSCOPY WITH INJECTION N/A 11/09/2018   Procedure: CYSTOSCOPY WITH INJECTION OF INDOCYANINE GREEN DYE;  Surgeon: Alexis Frock, MD;  Location[facility];  Service: Urology;  Laterality: N/A;  . CYSTOSCOPY WITH INSERTION OF UROLIFT N/A 01/18/2018   Procedure: CYSTOSCOPY WITH INSERTION OF UROLIFT;  Surgeon: Festus Aloe, MD;  Location: [facility];  Service: Urology;  Laterality: N/A;  . CYSTOSCOPY/URETEROSCOPY/HOLMIUM LASER Left 02/17/2019   Procedure: URETEROSCOPY WITH BALLOON DILATION  AND NEPHROSTOGRAM;  Surgeon: Alexis Frock, MD;  Location: [facility];  Service: Urology;  Laterality: Left;  1 HR  . EXCISION  RIGHT UPPER ARM LIPOMA  2005  . HEMORROIDECTOMY    . INGUINAL HERNIA REPAIR Left 1984  . IR NEPHROSTOMY PLACEMENT LEFT  01/05/2019  . KNEE ARTHROSCOPY Left X3  LAST ONE  2002  . ORIF LEFT HUMEROUS FX  1976  . POLYPECTOMY  05/11/2018   Procedure: POLYPECTOMY;  Surgeon: Daneil Dolin, MD;  [facility];  Service: Endoscopy;;  . PROSTATE SURGERY    . ROBOT ASSISTED LAPAROSCOPIC NEPHRECTOMY Left 07/14/2019   Procedure: XI ROBOTIC ASSISTED  LAPAROSCOPIC RETROPERITONEAL NEPHRECTOMY;  Surgeon: Alexis Frock, MD;  Location: [facility];  Service: Urology;  Laterality: Left;  3 HRS  . TONSILLECTOMY  AS CHILD  . TOTAL KNEE ARTHROPLASTY Left 2006   REVISION 2007  (AFTER I & D WITH ANTIBIOTIC SPACER PROCEDURE FOR STEPH INFECTION)  . TOTAL KNEE ARTHROPLASTY Right 03/30/2016   Procedure: RIGHT TOTAL KNEE ARTHROPLASTY;  Surgeon: Paralee Cancel, MD;  Location: [facility];  Service: Orthopedics;  Laterality: Right;  . TOTAL SHOULDER ARTHROPLASTY Right 04/06/2013   Procedure: RIGHT TOTAL SHOULDER ARTHROPLASTY;  Surgeon: Marin Shutter, MD;  Location: [place];  Service: Orthopedics;  Laterality: Right;  . TOTAL SHOULDER ARTHROPLASTY Left 07/20/2013   Procedure: LEFT TOTAL SHOULDER ARTHROPLASTY;  Surgeon: Marin Shutter, MD;  Location: [place];  Service: Orthopedics;  Laterality: Left;  . TRANSURETHRAL RESECTION OF BLADDER TUMOR N/A 11/12/2015   Procedure: TRANSURETHRAL RESECTION OF BLADDER TUMOR (TURBT);  Surgeon: Festus Aloe, MD;  Location: [facility];  Service: Urology;  Laterality: N/A;  . TRANSURETHRAL RESECTION OF BLADDER TUMOR WITH GYRUS (TURBT-GYRUS) N/A 12/05/2013   Procedure: TRANSURETHRAL RESECTION OF BLADDER TUMOR WITH GYRUS (TURBT-GYRUS);  Surgeon: Festus Aloe, MD;  Location: [facility];  Service: Urology;  Laterality: N/A;     reports that he quit smoking about 22 years ago. His smoking use included cigarettes. He has a 80.00 pack-year smoking history. He has never used smokeless tobacco. He reports that he does not drink alcohol and does not use drugs.  No Known Allergies  Family History  Problem Relation Age of Onset  . Alzheimer's disease Mother   . Heart attack Father   . Heart disease Father   . Irregular heart beat Brother        DEFIB.  . Congestive Heart Failure Brother   . Diabetes Daughter     Prior to Admission medications   Medication Sig Start Date End Date Taking? Authorizing Provider   amLODipine (NORVASC) 10 MG tablet Take 1 tablet (10 mg total) by mouth daily. Patient not taking: Reported on 01/24/2020 08/21/19 08/20/20  Janora Norlander, DO  atorvastatin (LIPITOR) 20 MG tablet Take 1 tablet (20 mg total) by mouth daily. 09/11/19 12/10/19  Troy Sine, MD  ciprofloxacin (CIPRO) 500 MG tablet Take 1 tablet (500 mg total) by mouth every 12 (twelve) hours. Patient not taking: Reported on 01/24/2020 12/26/19   Drenda Freeze, MD  docusate sodium (COLACE) 100 MG capsule Take 200 mg by mouth daily.     [provider]  ferrous sulfate 325 (65 FE) MG tablet Take 1 tablet (325 mg total) by mouth daily with breakfast. Patient not taking: Reported on 01/24/2020 05/16/19   Vicenta Dunning, MD  lisinopril (ZESTRIL) 5 MG tablet Take 1 tablet (5 mg total) by mouth daily. (Needs to be seen before next refill) 02/20/20   Janora Norlander, DO  Multiple Vitamin (MULTIVITAMIN ADULT) TABS Take 1 tablet by mouth daily.  [provider]  OVER THE COUNTER MEDICATION Take 1 tablet by mouth daily. Patient not taking: Reported on 01/24/2020    [provider]  polyvinyl alcohol (LIQUIFILM TEARS) 1.4 % ophthalmic solution Place 1 drop into both eyes as needed for dry eyes. 05/16/19   Sheth, Vickii Chafe, MD  predniSONE (DELTASONE) 5 MG tablet Take by mouth. 02/09/20   [provider]  traMADol (ULTRAM) 50 MG tablet Take 1 tablet (50 mg total) by mouth every 12 (twelve) hours as needed (for severe pain). Patient not taking: Reported on 03/21/2020 01/24/20   Janora Norlander, DO    Physical Exam: Vitals:   03/21/20 1841  BP: 131/89  Pulse: (!) 110  Resp: 18  Temp: (!) 100.6 F (38.1 C)  TempSrc: Oral  SpO2: 96%  Weight: 97.5 kg  Height: 6' (1.829 m)    Constitutional: NAD, calm, comfortable Vitals:   03/21/20 1841  BP: 131/89  Pulse: (!) 110  Resp: 18  Temp: (!) 100.6 F (38.1 C)  TempSrc: Oral  SpO2: 96%  Weight: 97.5 kg  Height: 6' (1.829 m)    Eyes: PERRL, lids and conjunctivae normal ENMT: Mucous membranes are moist.   Neck: normal, supple, no masses, no thyromegaly Respiratory: clear to auscultation bilaterally, no wheezing, no crackles. Normal respiratory effort. No accessory muscle use.  Cardiovascular: Tachycardic, regular rate and rhythm, no murmurs / rubs / gallops. No extremity edema. 2+ pedal pulses. No carotid bruits.  Abdomen: Urostomy bag right lower abdomen draining clear urine, stoma seen without drainage, no unusual erythema, no tenderness, no CVA tenderness, no masses palpated. No hepatosplenomegaly. Bowel sounds positive.  Musculoskeletal: no clubbing / cyanosis. No joint deformity upper and lower extremities. Good ROM, no contractures. Normal muscle tone.  Skin: no rashes, lesions, ulcers. No induration Neurologic: Moving extremities spontaneously no apparent cranial nerve abnormality. Psychiatric: Normal judgment and insight. Alert and oriented x 3. Normal mood.   Labs on Admission: I have personally reviewed following labs and imaging studies  CBC: Recent Labs  Lab 03/21/20 1853  WBC 10.6*  NEUTROABS 8.2*  HGB 13.5  HCT 39.6  MCV 89.2  PLT 119   Basic Metabolic Panel: Recent Labs  Lab 03/21/20 1853  NA 129*  K 4.2  CL 99  CO2 22  GLUCOSE 122*  BUN 21  CREATININE 1.46*  CALCIUM 9.6   Liver Function Tests: Recent Labs  Lab 03/21/20 1853  AST 32  ALT 26  ALKPHOS 53  BILITOT 1.2  PROT 8.0  ALBUMIN 4.4   Urine analysis:    Component Value Date/Time   COLORURINE YELLOW 03/21/2020 1853   APPEARANCEUR HAZY (A) 03/21/2020 1853   APPEARANCEUR Clear 01/24/2020 1105   LABSPEC 1.013 03/21/2020 1853   PHURINE 6.0 03/21/2020 1853   GLUCOSEU NEGATIVE 03/21/2020 1853   HGBUR MODERATE (A) 03/21/2020 1853   BILIRUBINUR NEGATIVE 03/21/2020 1853   BILIRUBINUR Negative 01/24/2020 1105   [place] 03/21/2020 1853   PROTEINUR NEGATIVE 03/21/2020 1853   NITRITE POSITIVE (A) 03/21/2020  1853   LEUKOCYTESUR SMALL (A) 03/21/2020 1853    Radiological Exams on Admission: XR Cervical Spine 2 or 3 views  Result Date: 03/21/2020 [facility] lateral cervical spine images demonstrate some disc space narrowing.  Cervical lordosis is maintained no subluxation and mild endplate spurring. Impression: Mild disc degeneration without subluxation.   EKG: Sinus  Assessment/Plan Principal Problem:   UTI (urinary tract infection) Active Problems:   ESBL (extended spectrum beta-lactamase) producing bacteria infection   HTN (  hypertension)   Malignant neoplasm of urinary bladder (HCC)  Sepsis secondary to urinary tract infection-fevers 100.6, sinus tachycardic to 110.  Patient meets sepsis criteria, with normal lactic acid 1.4.  Patient has a urostomy, prior history of ESBL.  Last urine culture 11/2019, with Klebsiella ESBL UTI and bacteremia. -IV meropenem (talked to pharmacy) -CBC, BMP in the morning -Follow-up urine and blood cultures -500 mill bolus given, continue N/s 100cc/hr x 15hrs  Hyponatremia-sodium 129. -Hydrate  Bladder cancer-follows with urologist Dr. Tammi Klippel he is status post robotic cystoprostatectomy, BCG refractory disease.  He had left nephrectomy- 07/2019 atrophic left kidney.  He has follow-up surveillance CT plan with Dr. Collene Mares for December this year.  Hypertension-stable.  Takes only lisinopril 5 mg daily -Resume lisinopril  Urostomy status  DVT prophylaxis: Lovenox Code Status: Full code Family Communication: None at bedside Disposition Plan: > 2 days, history of ESBL, needing IV antibiotics pending culture results. Consults called: none. Admission status: Inpatient, telemetry. I certify that at the point of admission it is my clinical judgment that the patient will require inpatient hospital care spanning beyond 2 midnights from the point of admission due to high intensity of service, high risk for further deterioration and high frequency of surveillance required.      Bethena Roys MD [facility]  03/21/2020, 9:48 PM

## 2020-03-21 NOTE — ED Triage Notes (Signed)
Pt with fever since last night.   Pt states he has frequent UTI's and is concerned that he has one now.  C/o back pain.  Took tylenol last at 1330 today.

## 2020-03-21 NOTE — Progress Notes (Signed)
Pharmacy Antibiotic Note  Billy Rasmussen is a 74 y.o. male admitted on 03/21/2020 with fever. Pt has prior hx of sepsis secondary to UTI and bladder cancer with urostomy. Pt rec'd dose of ceftriaxone 1 gm IV X 1 tonight in ED. Pt has hx of recurrent UTIs (urine cx on 12/26/19 grew ESBL Kleb pneumoniae and urine cx on 06/25/19 grew ESBL Kleb pneumoniae and Serratia marcescens; pt also had ESBL Kleb pneumoniae bacteremia in 7/21). Pharmacy has been consulted for meropenem dosing.  WBC 10.6, temp 100.6, Scr 1.46, CrCl 55.4 ml/min  Plan: Meropenem 1 gm IV Q 8 hrs Monitor WBC, temp, clinical improvement, cultures  Height: 6' (182.9 cm) Weight: 97.5 kg (215 lb) IBW/kg (Calculated) : 77.6  Temp (24hrs), Avg:100.6 F (38.1 C), Min:100.6 F (38.1 C), Max:100.6 F (38.1 C)  Recent Labs  Lab 03/21/20 1853  WBC 10.6*  CREATININE 1.46*  LATICACIDVEN 1.4    Estimated Creatinine Clearance: 55.4 mL/min (A) (by C-G formula based on SCr of 1.46 mg/dL (H)).    No Known Allergies  Antimicrobials this admission: 10/21 ceftriaxone X 1 10/21 meropenem >>  Microbiology results: 10/21 BCx X 2: pending 10/21 COVID, flu A, flu B: all negative  Thank you for allowing pharmacy to be a part of this patient's care.  Gillermina Hu, PharmD, BCPS, [facility] Clinical Pharmacist 03/21/2020 9:04 PM

## 2020-03-21 NOTE — ED Provider Notes (Signed)
[facility] EMERGENCY DEPARTMENT Provider Note   CSN: [id number] Arrival date & time: 03/21/20  1824     History Chief Complaint  Patient presents with  . Fever    Billy Rasmussen is a 74 y.o. male.  HPI 74 year old male with a history of hypertension, prior sepsis secondary to UTI, bladder cancer with urostomy followed by Dr. Junious Silk, obesity, hypertension presents to the ER with complaints of 2 days of malaise and fevers  Patient states that this feels like a UTI, which she gets frequently because of his urostomy.  Denies any abdominal pain, nausea, vomiting.  He does endorse back pain but states that this is chronic and not increased from baseline.  Denies any chest pain or shortness of breath.    Past Medical History:  Diagnosis Date  . Arthritis    knees, shoulders, elbows  . Bladder cancer [facility])    urologist-  dr Junious Silk  . Diverticulosis of colon   . Fibromyalgia   . GERD (gastroesophageal reflux disease)   . History of diverticulitis of colon   . History of gastric ulcer    due to aleve  . Hypertension   . Lower urinary tract symptoms (LUTS)   . OSA (obstructive sleep apnea)    NON-COMPLIANT  CPAP  --- BUT PT USES OXYGEN AT NIGHT 2.5L VIA [place] (PT'S DECISION)  . PONV (postoperative nausea and vomiting)   . Psoriasis   . Sepsis [facility])    january 2021  . Tinnitus    right ear more, has tranmitter in right ear removable at hs  . Wears glasses   . Wears partial dentures     Patient Active Problem List   Diagnosis Date Noted  . Lumbar foraminal stenosis 03/21/2020  . Bacteremia due to Klebsiella pneumoniae 12/28/2019  . S/P radical cystoprostatectomy 12/28/2019  . S/p nephrectomy 12/28/2019  . Renal mass 07/15/2019  . Nonfunctioning kidney 07/14/2019  . UTI (urinary tract infection) 06/25/2019  . OSA (obstructive sleep apnea) 05/12/2019  . Fever 04/09/2019  . SIRS (systemic inflammatory response syndrome) ([place]) 04/09/2019  . Sepsis due to urinary tract  infection ([place]) 02/17/2019  . Abnormal renal function 01/20/2019  . Enterococcus UTI   . Urinary tract infection without hematuria   . Acute pyelonephritis   . Gastroesophageal reflux disease   . Sepsis ([place]) 12/22/2018  . AKI (acute kidney injury) ([place]) 12/22/2018  . Bladder cancer ([place]) 11/09/2018  . 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 Acute cystitis with hematuria 10/02/2016  . Pure hypercholesterolemia 10/02/2016  . Malignant neoplasm of urinary bladder ([place]) 10/02/2016  . Lipoma 09/30/2016  . Obese 03/31/2016  . S/P right TKA 03/30/2016  . S/P knee replacement 03/30/2016  . HTN (hypertension) 02/25/2016  . Healthcare maintenance 02/25/2016  . Obesity (BMI 30-39.9) 01/07/2016  . S/P shoulder replacement 07/20/2013  . Shoulder arthritis 04/07/2013  . CELLULITIS, KNEE, LEFT 08/26/2006    Past Surgical History:  Procedure Laterality Date  . APPENDECTOMY    . COLECTOMY W/ COLOSTOMY  1996   W/   APPENDECTOMY  . COLONOSCOPY N/A 05/11/2018   Procedure: COLONOSCOPY;  Surgeon: Daneil Dolin, MD;  [facility];  Service: Endoscopy;  Laterality: N/A;  9:30  . COLOSTOMY TAKEDOWN  1996  . CYSTOSCOPY WITH BIOPSY N/A 12/05/2013   Procedure: CYSTO BLADDER BIOPSY AND FULGERATION;  Surgeon: Festus Aloe, MD;  Location: [facility];  Service: Urology;  Laterality: N/A;  . CYSTOSCOPY WITH BIOPSY Bilateral 11/13/2014   Procedure: CYSTOSCOPY  WITH  BLADDER BIOPSY FULGERATION AND BILATERAL RETROGRADE PYELOGRAMS;  Surgeon: Festus Aloe, MD;  Location: [facility];  Service: Urology;  Laterality: Bilateral;  . CYSTOSCOPY WITH FULGERATION N/A 01/18/2018   Procedure: CYSTOSCOPY WITH FULGERATION/ BLADDER BIOPSY;  Surgeon: Festus Aloe, MD;  Location: [facility];  Service: Urology;  Laterality: N/A;  . CYSTOSCOPY WITH INJECTION N/A 11/09/2018   Procedure: CYSTOSCOPY WITH INJECTION OF INDOCYANINE GREEN DYE;  Surgeon: Alexis Frock, MD;   [facility];  Service: Urology;  Laterality: N/A;  . CYSTOSCOPY WITH INSERTION OF UROLIFT N/A 01/18/2018   Procedure: CYSTOSCOPY WITH INSERTION OF UROLIFT;  Surgeon: Festus Aloe, MD;  Location: [facility];  Service: Urology;  Laterality: N/A;  . CYSTOSCOPY/URETEROSCOPY/HOLMIUM LASER Left 02/17/2019   Procedure: URETEROSCOPY WITH BALLOON DILATION  AND NEPHROSTOGRAM;  Surgeon: Alexis Frock, MD;  Location: [facility];  Service: Urology;  Laterality: Left;  1 HR  . EXCISION RIGHT UPPER ARM LIPOMA  2005  . HEMORROIDECTOMY    . INGUINAL HERNIA REPAIR Left 1984  . IR NEPHROSTOMY PLACEMENT LEFT  01/05/2019  . KNEE ARTHROSCOPY Left X3  LAST ONE  2002  . ORIF LEFT HUMEROUS FX  1976  . POLYPECTOMY  05/11/2018   Procedure: POLYPECTOMY;  Surgeon: Daneil Dolin, MD;  [facility];  Service: Endoscopy;;  . PROSTATE SURGERY    . ROBOT ASSISTED LAPAROSCOPIC NEPHRECTOMY Left 07/14/2019   Procedure: XI ROBOTIC ASSISTED LAPAROSCOPIC RETROPERITONEAL NEPHRECTOMY;  Surgeon: Alexis Frock, MD;  [facility];  Service: Urology;  Laterality: Left;  3 HRS  . TONSILLECTOMY  AS CHILD  . TOTAL KNEE ARTHROPLASTY Left 2006   REVISION 2007  (AFTER I & D WITH ANTIBIOTIC SPACER PROCEDURE FOR STEPH INFECTION)  . TOTAL KNEE ARTHROPLASTY Right 03/30/2016   Procedure: RIGHT TOTAL KNEE ARTHROPLASTY;  Surgeon: Paralee Cancel, MD;  [facility];  Service: Orthopedics;  Laterality: Right;  . TOTAL SHOULDER ARTHROPLASTY Right 04/06/2013   Procedure: RIGHT TOTAL SHOULDER ARTHROPLASTY;  Surgeon: Marin Shutter, MD;  Location: [place];  Service: Orthopedics;  Laterality: Right;  . TOTAL SHOULDER ARTHROPLASTY Left 07/20/2013   Procedure: LEFT TOTAL SHOULDER ARTHROPLASTY;  Surgeon: Marin Shutter, MD;  Location[facility];  Service: Orthopedics;  Laterality: Left;  . TRANSURETHRAL RESECTION OF BLADDER TUMOR N/A 11/12/2015   Procedure: TRANSURETHRAL RESECTION OF BLADDER TUMOR (TURBT);   Surgeon: Festus Aloe, MD;  Location[facility];  Service: Urology;  Laterality: N/A;  . TRANSURETHRAL RESECTION OF BLADDER TUMOR WITH GYRUS (TURBT-GYRUS) N/A 12/05/2013   Procedure: TRANSURETHRAL RESECTION OF BLADDER TUMOR WITH GYRUS (TURBT-GYRUS);  Surgeon: Festus Aloe, MD;  Location: [facility];  Service: Urology;  Laterality: N/A;       Family History  Problem Relation Age of Onset  . Alzheimer's disease Mother   . Heart attack Father   . Heart disease Father   . Irregular heart beat Brother        DEFIB.  . Congestive Heart Failure Brother   . Diabetes Daughter     Social History   Tobacco Use  . Smoking status: Former Smoker    Packs/day: 2.00    Years: 40.00    Pack years: 80.00    Types: Cigarettes    Quit date: 06/01/1997    Years since quitting: 22.8  . Smokeless tobacco: Never Used  Vaping Use  . Vaping Use: Never used  Substance Use Topics  . Alcohol use: No  .  Drug use: No    Home Medications Prior to Admission medications   Medication Sig Start Date End Date Taking? Authorizing Provider  amLODipine (NORVASC) 10 MG tablet Take 1 tablet (10 mg total) by mouth daily. Patient not taking: Reported on 01/24/2020 08/21/19 08/20/20  Janora Norlander, DO  atorvastatin (LIPITOR) 20 MG tablet Take 1 tablet (20 mg total) by mouth daily. 09/11/19 12/10/19  Troy Sine, MD  ciprofloxacin (CIPRO) 500 MG tablet Take 1 tablet (500 mg total) by mouth every 12 (twelve) hours. Patient not taking: Reported on 01/24/2020 12/26/19   Drenda Freeze, MD  docusate sodium (COLACE) 100 MG capsule Take 200 mg by mouth daily.     [provider]  ferrous sulfate 325 (65 FE) MG tablet Take 1 tablet (325 mg total) by mouth daily with breakfast. Patient not taking: Reported on 01/24/2020 05/16/19   Vicenta Dunning, MD  lisinopril (ZESTRIL) 5 MG tablet Take 1 tablet (5 mg total) by mouth daily. (Needs to be seen before next refill) 02/20/20    Janora Norlander, DO  Multiple Vitamin (MULTIVITAMIN ADULT) TABS Take 1 tablet by mouth daily.    [provider]  OVER THE COUNTER MEDICATION Take 1 tablet by mouth daily. Patient not taking: Reported on 01/24/2020    [provider]  polyvinyl alcohol (LIQUIFILM TEARS) 1.4 % ophthalmic solution Place 1 drop into both eyes as needed for dry eyes. 05/16/19   Sheth, Vickii Chafe, MD  predniSONE (DELTASONE) 5 MG tablet Take by mouth. 02/09/20   [provider]  traMADol (ULTRAM) 50 MG tablet Take 1 tablet (50 mg total) by mouth every 12 (twelve) hours as needed (for severe pain). Patient not taking: Reported on 03/21/2020 01/24/20   Janora Norlander, DO    Allergies    Patient has no known allergies.  Review of Systems   Review of Systems  Constitutional: Positive for fatigue and fever. Negative for chills.  HENT: Negative for ear pain and sore throat.   Eyes: Negative for pain and visual disturbance.  Respiratory: Negative for cough and shortness of breath.   Cardiovascular: Negative for chest pain and palpitations.  Gastrointestinal: Negative for abdominal pain and vomiting.  Genitourinary: Negative for dysuria and hematuria.  Musculoskeletal: Positive for back pain (Chronic). Negative for arthralgias.  Skin: Negative for color change and rash.  Neurological: Positive for weakness. Negative for seizures and syncope.  All other systems reviewed and are negative.   Physical Exam Updated Vital Signs BP 131/89 (BP Location: Left Arm)   Pulse (!) 110   Temp (!) 100.6 F (38.1 C) (Oral)   Resp 18   Ht 6' (1.829 m)   Wt 97.5 kg   SpO2 96%   BMI 29.16 kg/m   Physical Exam Constitutional:      General: He is not in acute distress.    Appearance: Normal appearance. He is ill-appearing. He is not toxic-appearing or diaphoretic.  HENT:     Head: Normocephalic and atraumatic.  Eyes:     Extraocular Movements: Extraocular movements intact.      Conjunctiva/sclera: Conjunctivae normal.     Pupils: Pupils are equal, round, and reactive to light.  Cardiovascular:     Rate and Rhythm: Normal rate and regular rhythm.     Pulses: Normal pulses.     Heart sounds: Normal heart sounds.  Pulmonary:     Effort: Pulmonary effort is normal.     Breath sounds: Normal breath sounds.  Abdominal:  General: Abdomen is flat. Bowel sounds are normal. There is no distension.     Palpations: Abdomen is soft. There is no mass.     Tenderness: There is no abdominal tenderness. There is no right CVA tenderness, left CVA tenderness, guarding or rebound.     Comments: Urostomy stoma without evidence of surrounding erythema or drainage  Musculoskeletal:        General: Normal range of motion.     Cervical back: Normal range of motion. No tenderness.  Skin:    General: Skin is warm and dry.     Findings: No erythema or rash.  Neurological:     General: No focal deficit present.     Mental Status: He is alert and oriented to person, place, and time.     Sensory: No sensory deficit.     Motor: No weakness.  Psychiatric:        Mood and Affect: Mood normal.        Behavior: Behavior normal.     ED Results / Procedures / Treatments   Labs (all labs ordered are listed, but only abnormal results are displayed) Labs Reviewed  CBC WITH DIFFERENTIAL/PLATELET - Abnormal; Notable for the following components:      Result Value   WBC 10.6 (*)    Neutro Abs 8.2 (*)    Monocytes Absolute 1.2 (*)    All other components within normal limits  COMPREHENSIVE METABOLIC PANEL - Abnormal; Notable for the following components:   Sodium 129 (*)    Glucose, Bld 122 (*)    Creatinine, Ser 1.46 (*)    GFR, Estimated 51 (*)    All other components within normal limits  URINALYSIS, ROUTINE W REFLEX MICROSCOPIC - Abnormal; Notable for the following components:   APPearance HAZY (*)    Hgb urine dipstick MODERATE (*)    Nitrite POSITIVE (*)    Leukocytes,Ua  SMALL (*)    Bacteria, UA RARE (*)    All other components within normal limits  RESPIRATORY PANEL BY RT PCR (FLU A&B, COVID)  CULTURE, BLOOD (ROUTINE X 2)  CULTURE, BLOOD (ROUTINE X 2)  URINE CULTURE  LACTIC ACID, PLASMA  LACTIC ACID, PLASMA    EKG None  Radiology XR Cervical Spine 2 or 3 views  Result Date: 03/21/2020 [facility] lateral cervical spine images demonstrate some disc space narrowing.  Cervical lordosis is maintained no subluxation and mild endplate spurring. Impression: Mild disc degeneration without subluxation.   Procedures Procedures (including critical care time)  Medications Ordered in ED Medications  cefTRIAXone (ROCEPHIN) 1 g in sodium chloride 0.9 % 100 mL IVPB (1 g Intravenous New Bag/Given 03/21/20 2033)  acetaminophen (TYLENOL) tablet 650 mg (650 mg Oral Given 03/21/20 1939)  sodium chloride 0.9 % bolus 500 mL (500 mLs Intravenous New Bag/Given 03/21/20 2034)    ED Course  I have reviewed the triage vital signs and the nursing notes.  Pertinent labs & imaging results that were available during my care of the patient were reviewed by me and considered in my medical decision making (see chart for details).    MDM Rules/Calculators/A&P                          74 year old male with fevers, malaise and concerns for UTI.  On presentation, he is ill-appearing, his fever of 100.6 and is tachycardic at 110.  He also has a white count of 10.6 which technically would qualify him as a  code sepsis however this was not called as the patient's blood pressure appears to be stable.  His urine does have moderate hemoglobin positive nitrates and leukocytes with rare bacteria suggesting UTI.  Urine culture pending.  Creatinine appears to be at baseline.  He also has a sodium of 129.  Initial lactate of 1.4.  Covid swab is pending.  Blood cultures pending.  Discussed the case with Dr. Roderic Palau, we feel that the patient would benefit from inpatient IV antibiotic and treatment given  frequent UTIs and sepsis presentations.  Patient was given Tylenol for fever, given 500 cc of fluids and IV Rocephin.  Consulted hospitalist Dr. Denton Brick who will admit the patient further evaluation and treatment.  Remains hemodynamically stable at this time.   Final Clinical Impression(s) / ED Diagnoses Final diagnoses:  Urinary tract infection with hematuria, site unspecified    Rx / DC Orders ED Discharge Orders    None       Lyndel Safe 03/21/20 2051    Milton Ferguson, MD 03/22/20 [phone]

## 2020-03-21 NOTE — Progress Notes (Signed)
Office Visit Note   Patient: Billy Rasmussen           Date of Birth: 1946/05/19           MRN: [id number] Visit Date: 03/21/2020              Requested by: Janora Norlander, DO [place],  [place] [id number] PCP: Janora Norlander, DO   Assessment & Plan: Visit Diagnoses:  1. Neck pain   2. Lumbar foraminal stenosis     Plan: Based on exam does not appear that he has problems with cervical stenosis that could be causing gait problems he does not have wide space gait he just is walking slower as he ages with arthritis multiple areas.  We reviewed his CT scan he does not have any central stenosis.  He can follow-up as needed.  Cervical x-rays previous lumbar x-rays, cervical CT scan was reviewed with patient and wife images and reports.  Follow-Up Instructions: No follow-ups on file.   Orders:  Orders Placed This Encounter  Procedures  . XR Cervical Spine 2 or 3 views   No orders of the defined types were placed in this encounter.     Procedures: No procedures performed   Clinical Data: No additional findings.   Subjective: Chief Complaint  Patient presents with  . Lower Back - Follow-up, Pain    CT Lumbar review    HPI 74 year old male returns is here with his wife and his here for CT scan lumbar spine review.  He has back pain.  Facet arthropathy is noted he has had arthritis in his hands previous knee arthroplasties.  His wife notices that he is walking slower than he used to.  He has had some pain in his neck has had both shoulders replaced.  Epidurals in the past in the lumbar spine.  Only has one kidney when he stopped taking anti-inflammatories he started having increased aches and pains multiple joints throughout his body.  Previous MRI of cervical spine showed spondylosis 15 years ago.  Review of Systems 14 system update unchanged.   Objective: Vital Signs: BP (!) 144/99   Pulse (!) 101   Ht 5\' 6"  (1.676 m)   Wt 219 lb (99.3 kg)   BMI 35.35  kg/m   Physical Exam HENT:     Head: Normocephalic.     Nose: Nose normal.  Eyes:     Extraocular Movements: Extraocular movements intact.  Pulmonary:     Effort: Pulmonary effort is normal.  Abdominal:     Comments: Stoma abdominal wall  Skin:    Coloration: Skin is not jaundiced.     Findings: No bruising.  Neurological:     Mental Status: He is alert.     Ortho Exam no impingement of the shoulders has had both shoulders replaced knees replaced.  Some brachial plexus tenderness right and left 50% range of motion of his neck.  Upper extremity reflexes are intact no sensory deficit upper extremities.  Specialty Comments:  No specialty comments available.  Imaging: CT done due to only having 1 kidney and concerned about contrast.  CT scan shows no acute fracture or subluxation lumbar spine is moderate right L2-3 neuroforaminal stenosis.  No evidence of metastatic disease.   PMFS History: Patient Active Problem List   Diagnosis Date Noted  . Lumbar foraminal stenosis 03/21/2020  . Bacteremia due to Klebsiella pneumoniae 12/28/2019  . S/P radical cystoprostatectomy 12/28/2019  . S/p nephrectomy 12/28/2019  .  Renal mass 07/15/2019  . Nonfunctioning kidney 07/14/2019  . UTI (urinary tract infection) 06/25/2019  . OSA (obstructive sleep apnea) 05/12/2019  . Fever 04/09/2019  . SIRS (systemic inflammatory response syndrome) ([place]) 04/09/2019  . Sepsis due to urinary tract infection ([place]) 02/17/2019  . Abnormal renal function 01/20/2019  . Enterococcus UTI   . Urinary tract infection without hematuria   . Acute pyelonephritis   . Gastroesophageal reflux disease   . Sepsis ([place]) 12/22/2018  . AKI (acute kidney injury) ([place]) 12/22/2018  . Bladder cancer ([place]) 11/09/2018  . 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 Acute cystitis with hematuria 10/02/2016  . Pure hypercholesterolemia 10/02/2016  . Malignant neoplasm of urinary bladder ([place]) 10/02/2016  . Lipoma 09/30/2016  . Obese  03/31/2016  . S/P right TKA 03/30/2016  . S/P knee replacement 03/30/2016  . HTN (hypertension) 02/25/2016  . Healthcare maintenance 02/25/2016  . Obesity (BMI 30-39.9) 01/07/2016  . S/P shoulder replacement 07/20/2013  . Shoulder arthritis 04/07/2013  . CELLULITIS, KNEE, LEFT 08/26/2006   Past Medical History:  Diagnosis Date  . Arthritis    knees, shoulders, elbows  . Bladder cancer [facility])    urologist-  dr Junious Silk  . Diverticulosis of colon   . Fibromyalgia   . GERD (gastroesophageal reflux disease)   . History of diverticulitis of colon   . History of gastric ulcer    due to aleve  . Hypertension   . Lower urinary tract symptoms (LUTS)   . OSA (obstructive sleep apnea)    NON-COMPLIANT  CPAP  --- BUT PT USES OXYGEN AT NIGHT 2.5L VIA [place] (PT'S DECISION)  . PONV (postoperative nausea and vomiting)   . Psoriasis   . Sepsis [facility])    january 2021  . Tinnitus    right ear more, has tranmitter in right ear removable at hs  . Wears glasses   . Wears partial dentures     Family History  Problem Relation Age of Onset  . Alzheimer's disease Mother   . Heart attack Father   . Heart disease Father   . Irregular heart beat Brother        DEFIB.  . Congestive Heart Failure Brother   . Diabetes Daughter     Past Surgical History:  Procedure Laterality Date  . APPENDECTOMY    . COLECTOMY W/ COLOSTOMY  1996   W/   APPENDECTOMY  . COLONOSCOPY N/A 05/11/2018   Procedure: COLONOSCOPY;  Surgeon: Daneil Dolin, MD;  [facility];  Service: Endoscopy;  Laterality: N/A;  9:30  . COLOSTOMY TAKEDOWN  1996  . CYSTOSCOPY WITH BIOPSY N/A 12/05/2013   Procedure: CYSTO BLADDER BIOPSY AND FULGERATION;  Surgeon: Festus Aloe, MD;  Location: [facility];  Service: Urology;  Laterality: N/A;  . CYSTOSCOPY WITH BIOPSY Bilateral 11/13/2014   Procedure: CYSTOSCOPY WITH  BLADDER BIOPSY FULGERATION AND BILATERAL RETROGRADE PYELOGRAMS;  Surgeon: Festus Aloe, MD;   [facility];  Service: Urology;  Laterality: Bilateral;  . CYSTOSCOPY WITH FULGERATION N/A 01/18/2018   Procedure: CYSTOSCOPY WITH FULGERATION/ BLADDER BIOPSY;  Surgeon: Festus Aloe, MD;  [facility];  Service: Urology;  Laterality: N/A;  . CYSTOSCOPY WITH INJECTION N/A 11/09/2018   Procedure: CYSTOSCOPY WITH INJECTION OF INDOCYANINE GREEN DYE;  Surgeon: Alexis Frock, MD;  [facility];  Service: Urology;  Laterality: N/A;  . CYSTOSCOPY WITH INSERTION OF UROLIFT N/A 01/18/2018   Procedure: CYSTOSCOPY WITH INSERTION OF UROLIFT;  Surgeon: Festus Aloe, MD;  Location: [place];  Service: Urology;  Laterality: N/A;  . CYSTOSCOPY/URETEROSCOPY/HOLMIUM LASER Left 02/17/2019   Procedure: URETEROSCOPY WITH BALLOON DILATION  AND NEPHROSTOGRAM;  Surgeon: Alexis Frock, MD;  Location: [facility];  Service: Urology;  Laterality: Left;  1 HR  . EXCISION RIGHT UPPER ARM LIPOMA  2005  . HEMORROIDECTOMY    . INGUINAL HERNIA REPAIR Left 1984  . IR NEPHROSTOMY PLACEMENT LEFT  01/05/2019  . KNEE ARTHROSCOPY Left X3  LAST ONE  2002  . ORIF LEFT HUMEROUS FX  1976  . POLYPECTOMY  05/11/2018   Procedure: POLYPECTOMY;  Surgeon: Daneil Dolin, MD;  [facility];  Service: Endoscopy;;  . PROSTATE SURGERY    . ROBOT ASSISTED LAPAROSCOPIC NEPHRECTOMY Left 07/14/2019   Procedure: XI ROBOTIC ASSISTED LAPAROSCOPIC RETROPERITONEAL NEPHRECTOMY;  Surgeon: Alexis Frock, MD;  [facility];  Service: Urology;  Laterality: Left;  3 HRS  . TONSILLECTOMY  AS CHILD  . TOTAL KNEE ARTHROPLASTY Left 2006   REVISION 2007  (AFTER I & D WITH ANTIBIOTIC SPACER PROCEDURE FOR STEPH INFECTION)  . TOTAL KNEE ARTHROPLASTY Right 03/30/2016   Procedure: RIGHT TOTAL KNEE ARTHROPLASTY;  Surgeon: Paralee Cancel, MD;  [facility];  Service: Orthopedics;  Laterality: Right;  . TOTAL SHOULDER ARTHROPLASTY Right 04/06/2013   Procedure:  RIGHT TOTAL SHOULDER ARTHROPLASTY;  Surgeon: Marin Shutter, MD;  Location: [place];  Service: Orthopedics;  Laterality: Right;  . TOTAL SHOULDER ARTHROPLASTY Left 07/20/2013   Procedure: LEFT TOTAL SHOULDER ARTHROPLASTY;  Surgeon: Marin Shutter, MD;  Location: [place];  Service: Orthopedics;  Laterality: Left;  . TRANSURETHRAL RESECTION OF BLADDER TUMOR N/A 11/12/2015   Procedure: TRANSURETHRAL RESECTION OF BLADDER TUMOR (TURBT);  Surgeon: Festus Aloe, MD;  Location: [facility];  Service: Urology;  Laterality: N/A;  . TRANSURETHRAL RESECTION OF BLADDER TUMOR WITH GYRUS (TURBT-GYRUS) N/A 12/05/2013   Procedure: TRANSURETHRAL RESECTION OF BLADDER TUMOR WITH GYRUS (TURBT-GYRUS);  Surgeon: Festus Aloe, MD;  Location: [facility];  Service: Urology;  Laterality: N/A;   Social History   Occupational History  . 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 Smoking status: Former Smoker    Packs/day: 2.00    Years: 40.00    Pack years: 80.00    Types: Cigarettes    Quit date: 06/01/1997    Years since quitting: 22.8  . Smokeless tobacco: Never Used  Vaping Use  . Vaping Use: Never used  Substance and Sexual Activity  . Alcohol use: No  . Drug use: No  . Sexual activity: Not Currently

## 2020-03-22 DIAGNOSIS — N3000 Acute cystitis without hematuria: Secondary | ICD-10-CM | POA: Diagnosis not present

## 2020-03-22 LAB — BASIC METABOLIC PANEL
Anion gap: 9 (ref 5–15)
BUN: 18 mg/dL (ref 8–23)
CO2: 22 mmol/L (ref 22–32)
Calcium: 9.3 mg/dL (ref 8.9–10.3)
Chloride: 102 mmol/L (ref 98–111)
Creatinine, Ser: 1.3 mg/dL — ABNORMAL HIGH (ref 0.61–1.24)
GFR, Estimated: 58 mL/min — ABNORMAL LOW (ref >60)
Glucose, Bld: 108 mg/dL — ABNORMAL HIGH (ref 70–99)
Potassium: 4.1 mmol/L (ref 3.5–5.1)
Sodium: 133 mmol/L — ABNORMAL LOW (ref 135–145)

## 2020-03-22 LAB — CBC
HCT: 40.7 % (ref 39.0–52.0)
Hemoglobin: 13.6 g/dL (ref 13.0–17.0)
MCH: 30.1 pg (ref 26.0–34.0)
MCHC: 33.4 g/dL (ref 30.0–36.0)
MCV: 90 fL (ref 80.0–100.0)
Platelets: 133 10*3/uL — ABNORMAL LOW (ref 150–400)
RBC: 4.52 MIL/uL (ref 4.22–5.81)
RDW: 14.4 % (ref 11.5–15.5)
WBC: 9.1 10*3/uL (ref 4.0–10.5)
nRBC: 0 % (ref 0.0–0.2)

## 2020-03-22 MED ORDER — TRAMADOL HCL 50 MG PO TABS
50.0000 mg | ORAL_TABLET | Freq: Two times a day (BID) | ORAL | Status: DC | PRN
  Administered 2020-03-22 – 2020-03-25 (×7): 50 mg via ORAL
  Filled 2020-03-22 (×7): qty 1

## 2020-03-22 NOTE — Progress Notes (Signed)
PROGRESS NOTE    Billy Rasmussen  TIR:[id number] DOB: 08-11-1945 DOA: 03/21/2020 PCP: Janora Norlander, DO  Brief Narrative:  74 year old white male TA T1 CIS bladder cancer 2015 status post BCG-most recently advanced disease Discussion regarding Keytruda versus cystectomy Cystoprostatectomy with conduit diversion 6/76/1950-[id number] complicated by pyelonephritis AKI etc.-was treated for 14 days with IV cefepime in a setting of left ureteric stricture on nephrostomy was considered Had recurrent urinary infection 9/18 through 02/20/2019 admission Also grew Klebsiella on 03/21/2019 admission and does have $0.45   Last admission 12/28/2019 found to be febrile given Cipro but returned later feeling feverish Found to have gram-negative rods Klebsiella call back to the ED found to have new thrombocytopenia Treated with meropenem for urinary infection/ESBL received 3 days of meropenem and per ID was transitioned to ciprofloxacin  re presented to the [facility] emergency room 10/21 2021 with fevers T-max 100.6 WBC 10 sodium 129   Assessment & Plan:   Principal Problem:   UTI (urinary tract infection) Active Problems:   HTN (hypertension)   Malignant neoplasm of urinary bladder (HCC)   ESBL (extended spectrum beta-lactamase) producing bacteria infection   1. Sepsis secondary to probable ESBL a. We will continue meropenem given previous multiple ESBL infection and will narrow based on cultures blood and urine from 10/21 b. Continue normal saline at 100 cc/h but cut back rate of sepsis physiology seems resolved 2. AKI on admission with hyponatremia a. Hyponatremia secondary to sepsis as his AKI continue fluids as above b. Seems to be resolving slowly, for now can continue ACE inhibitor cautiously 3. Bladder cancer status post robotic cystoprostatectomy 2020 left nephrectomy 07/2019 a. Complicated urological anatomy-had left nephrectomy because strictures and hydronephrosis were  thought to be risk factor for infection b. Have suggested that he discuss in the outpatient further with Dr. Alexis Frock who I will CC on this note 4. HTN a. Blood pressures are reasonably controlled 1 30-1 40 continue lisinopril  DVT prophylaxis: Heparin Code Status: Full Family Communication: No family at the bedside Disposition: Inpatient pending resolution expect 2 days at least  Status is: Inpatient  Remains inpatient appropriate because:IV treatments appropriate due to intensity of illness or inability to take PO   Dispo: The patient is from: Home              Anticipated d/c is to: Home              Anticipated d/c date is: 2 days              Patient currently is not medically stable to d/c.       Consultants:   None is yet  Procedures: None  Antimicrobials: Meropenem IV   Subjective: Patient awake alert poor appetite and low-grade fever overnight no chest pain No cough No other issues  Objective: Vitals:   03/21/20 2254 03/22/20 0229 03/22/20 0236 03/22/20 0358  BP:  (!) 152/92 134/78 140/69  Pulse:  (!) 108 (!) 110 98  Resp:  18  16  Temp:  (!) 100.7 F (38.2 C)  100 F (37.8 C)  TempSrc:  Oral  Oral  SpO2: 98% 94% 95% 96%  Weight:      Height:        Intake/Output Summary (Last 24 hours) at 03/22/2020 0856 Last data filed at 03/22/2020 0400 Gross per 24 hour  Intake 700 ml  Output 1200 ml  Net -500 ml   Filed Weights   03/21/20 1841 03/21/20 2220  Weight: 97.5 kg 100.3 kg    Examination:  General exam: EOMI NCAT no focal deficit no icterus no pallor Respiratory system: Clinically clear no rales no rhonchi Cardiovascular system: S1-S2 no murmur no rub no gallop not on telemetry Gastrointestinal system: Abdomen soft right-sided urostomy bag in place with clear urine draining urine. Central nervous system: Intact no focal deficit moving all 4 limbs equally Extremities: No lower extremity edema Skin: No rash Psychiatry: Euthymic and  congruent pleasant affect  Data Reviewed: I have personally reviewed following labs and imaging studies Sodium 133 BUN/creatinine 21/1.46-->18/1.3 White count 10.6->9.1 Platelet 150-->133  Radiology Studies: XR Cervical Spine 2 or 3 views  Result Date: 03/21/2020 AP lateral cervical spine images demonstrate some disc space narrowing.  Cervical lordosis is maintained no subluxation and mild endplate spurring. Impression: Mild disc degeneration without subluxation.    Scheduled Meds:  enoxaparin (LOVENOX) injection  40 mg Subcutaneous Q24H   influenza vaccine adjuvanted  0.5 mL Intramuscular Tomorrow-1000   lisinopril  5 mg Oral Daily   Continuous Infusions:  sodium chloride 100 mL/hr at 03/22/20 0006   meropenem (MERREM) IV 1 g (03/22/20 0459)     LOS: 1 day    Time spent: [place], MD [facility] To contact the attending provider between 7A-7P or the covering provider during after hours 7P-7A, please log into the web site [email or web address] and access using universal [place] password for that web site. If you do not have the password, please call the hospital operator.  03/22/2020, 8:56 AM

## 2020-03-22 NOTE — Care Management Important Message (Signed)
Important Message  Patient Details  Name: Billy Rasmussen MRN: [id number] Date of Birth: 02-28-1946   Medicare Important Message Given:  Yes     Tommy Medal 03/22/2020, 3:21 PM

## 2020-03-22 NOTE — Progress Notes (Signed)
   03/22/20 1539  Assess: MEWS Score  Temp (!) 103 F (39.4 C)  BP (!) 166/88  Pulse Rate 97  Resp 20  Level of Consciousness Alert  SpO2 100 %  O2 Device Room Air  Assess: MEWS Score  MEWS Temp 2  MEWS Systolic 0  MEWS Pulse 0  MEWS RR 0  MEWS LOC 0  MEWS Score 2  MEWS Score Color Yellow  Treat  Pain Scale 0-10  Pain Score 10  Pain Intervention(s) Medication (See eMAR)  Take Vital Signs  Increase Vital Sign Frequency  Yellow: Q 2hr X 2 then Q 4hr X 2, if remains yellow, continue Q 4hrs  Escalate  MEWS: Escalate Yellow: discuss with charge nurse/RN and consider discussing with provider and RRT  Notify: Charge Nurse/RN  Name of Charge Nurse/RN Notified Tashianna Broome rn  Date Charge Nurse/RN Notified 03/22/20  Time Charge Nurse/RN Notified 1608  Notify: Provider  Provider Name/Title J. [place]  Date Provider Notified 03/22/20  Time Provider Notified 1530  Notification Type Page  Notification Reason Change in status  Response No new orders

## 2020-03-23 DIAGNOSIS — N3000 Acute cystitis without hematuria: Secondary | ICD-10-CM | POA: Diagnosis not present

## 2020-03-23 LAB — URINE CULTURE

## 2020-03-23 MED ORDER — SENNOSIDES-DOCUSATE SODIUM 8.6-50 MG PO TABS
1.0000 | ORAL_TABLET | Freq: Every day | ORAL | Status: DC
  Administered 2020-03-23 – 2020-03-25 (×3): 1 via ORAL
  Filled 2020-03-23 (×3): qty 1

## 2020-03-23 NOTE — Progress Notes (Signed)
PROGRESS NOTE    Billy Rasmussen  VOJ:[id number] DOB: 04/18/1946 DOA: 03/21/2020 PCP: Janora Norlander, DO  Brief Narrative:  74 year old white male TA T1 CIS bladder cancer 2015 status post BCG-most recently advanced disease Discussion regarding Keytruda versus cystectomy Cystoprostatectomy with conduit diversion 9/93/7169-[id number] complicated by pyelonephritis AKI etc.-was treated for 14 days with IV cefepime in a setting of left ureteric stricture on nephrostomy was considered Had recurrent urinary infection 9/18 through 02/20/2019 admission Also grew Klebsiella on 03/21/2019 admission and does have $0.45  Last admission 12/28/2019 found to be febrile given Cipro but returned later feeling feverish Found to have gram-negative rods Klebsiella call back to the ED found to have new thrombocytopenia Treated with meropenem for urinary infection/ESBL received 3 days of meropenem and per ID was transitioned to ciprofloxacin  Re-presented to the [facility] emergency room 10/21 2021 with fevers T-max 100.6 WBC 10 sodium 129 Ultimately felt to have probable urinary infection given history of recurrences in the past  Assessment & Plan:   Principal Problem:   UTI (urinary tract infection) Active Problems:   HTN (hypertension)   Malignant neoplasm of urinary bladder (HCC)   ESBL (extended spectrum beta-lactamase) producing bacteria infection   1. Sepsis secondary to probable ESBL a. We will continue meropenem given patient had 103 fever 10/20 2 PM b. Treat 1 more day with meropenem until 10/24 and then transition complete a total of 10 days of antibiotics in the outpatient setting c. If patient has recurrence of fever will need to reculture as urine from admission growing multiple colony-forming units 2. AKI on admission with hyponatremia a. Recheck labs for hyponatremia again in a.m. b. AKI is gradually improving 3. Bladder cancer status post robotic cystoprostatectomy 2020 left  nephrectomy 07/2019 a. Complicated urological anatomy-had left nephrectomy because strictures and hydronephrosis were thought to be risk factor for infection b. Have suggested that he discuss in the outpatient further with Dr. Alexis Frock who I will CC on this note 4. HTN a. Blood pressures are reasonably controlled 1 30-1 40 continue lisinopril  DVT prophylaxis: Heparin Code Status: Full Family Communication: No family at the bedside Disposition: Likely discharge on 10/25  Status is: Inpatient  Remains inpatient appropriate because:IV treatments appropriate due to intensity of illness or inability to take PO   Dispo: The patient is from: Home              Anticipated d/c is to: Home              Anticipated d/c date is: 2 days              Patient currently is not medically stable to d/c.       Consultants:   None is yet  Procedures: None  Antimicrobials: Meropenem IV   Subjective: 103 degrees fever yesterday No chest pain and is constipated   Objective: Vitals:   03/22/20 1739 03/22/20 1939 03/22/20 2339 03/23/20 0529  BP: (!) 148/75 124/66 (!) 148/89 (!) 154/79  Pulse: 100 88 80 70  Resp: 20 20 20 18   Temp: (!) 100.4 F (38 C) 99.1 F (37.3 C) 98.7 F (37.1 C) 98.9 F (37.2 C)  TempSrc: Oral Oral Oral Oral  SpO2: 98% 98% 100% 98%  Weight:      Height:        Intake/Output Summary (Last 24 hours) at 03/23/2020 1331 Last data filed at 03/23/2020 1300 Gross per 24 hour  Intake 480 ml  Output 2300 ml  Net -1820 ml   Filed Weights   03/21/20 1841 03/21/20 2220  Weight: 97.5 kg 100.3 kg    Examination:  General exam: EOMI NCAT no icterus no pallor Respiratory system: Clinically clear no rales rhonchi Cardiovascular system: S1-S2 no murmur no rub no gallop not on telemetry Gastrointestinal system: Abdomen soft right-sided urostomy bag in place with clear urine draining urine. Central nervous system: Intact no focal deficit moving all 4 limbs  equally Extremities: No lower extremity edema Skin: No rash Psychiatry: Euthymic and congruent pleasant affect  Data Reviewed: I have personally reviewed following labs and imaging studies No labs today Radiology Studies: No results found.   Scheduled Meds: . enoxaparin (LOVENOX) injection  40 mg Subcutaneous Q24H  . influenza vaccine adjuvanted  0.5 mL Intramuscular Tomorrow-1000  . lisinopril  5 mg Oral Daily  . senna-docusate  1 tablet Oral QHS   Continuous Infusions: . meropenem (MERREM) IV 1 g (03/23/20 0542)     LOS: 2 days    Time spent: [place], MD [facility] To contact the attending provider between 7A-7P or the covering provider during after hours 7P-7A, please log into the web site [email or web address] and access using universal [place] password for that web site. If you do not have the password, please call the hospital operator.  03/23/2020, 1:31 PM

## 2020-03-23 NOTE — Plan of Care (Signed)

## 2020-03-24 DIAGNOSIS — N3000 Acute cystitis without hematuria: Secondary | ICD-10-CM | POA: Diagnosis not present

## 2020-03-24 LAB — COMPREHENSIVE METABOLIC PANEL
ALT: 20 U/L (ref 0–44)
AST: 28 U/L (ref 15–41)
Albumin: 3.7 g/dL (ref 3.5–5.0)
Alkaline Phosphatase: 43 U/L (ref 38–126)
Anion gap: 8 (ref 5–15)
BUN: 21 mg/dL (ref 8–23)
CO2: 29 mmol/L (ref 22–32)
Calcium: 9.7 mg/dL (ref 8.9–10.3)
Chloride: 97 mmol/L — ABNORMAL LOW (ref 98–111)
Creatinine, Ser: 1.3 mg/dL — ABNORMAL HIGH (ref 0.61–1.24)
GFR, Estimated: 58 mL/min — ABNORMAL LOW (ref >60)
Glucose, Bld: 94 mg/dL (ref 70–99)
Potassium: 4.3 mmol/L (ref 3.5–5.1)
Sodium: 134 mmol/L — ABNORMAL LOW (ref 135–145)
Total Bilirubin: 0.9 mg/dL (ref 0.3–1.2)
Total Protein: 7.3 g/dL (ref 6.5–8.1)

## 2020-03-24 LAB — CBC WITH DIFFERENTIAL/PLATELET
Abs Immature Granulocytes: 0.04 10*3/uL (ref 0.00–0.07)
Basophils Absolute: 0 10*3/uL (ref 0.0–0.1)
Basophils Relative: 0 %
Eosinophils Absolute: 0.2 10*3/uL (ref 0.0–0.5)
Eosinophils Relative: 3 %
HCT: 39.7 % (ref 39.0–52.0)
Hemoglobin: 13.1 g/dL (ref 13.0–17.0)
Immature Granulocytes: 1 %
Lymphocytes Relative: 17 %
Lymphs Abs: 1 10*3/uL (ref 0.7–4.0)
MCH: 30.2 pg (ref 26.0–34.0)
MCHC: 33 g/dL (ref 30.0–36.0)
MCV: 91.5 fL (ref 80.0–100.0)
Monocytes Absolute: 0.8 10*3/uL (ref 0.1–1.0)
Monocytes Relative: 14 %
Neutro Abs: 3.8 10*3/uL (ref 1.7–7.7)
Neutrophils Relative %: 65 %
Platelets: 145 10*3/uL — ABNORMAL LOW (ref 150–400)
RBC: 4.34 MIL/uL (ref 4.22–5.81)
RDW: 14.5 % (ref 11.5–15.5)
WBC: 5.9 10*3/uL (ref 4.0–10.5)
nRBC: 0 % (ref 0.0–0.2)

## 2020-03-24 IMAGING — CR DG CHEST 2V
2 series · 2 of 2 positions shown · non-contrast
Comparison: 01/06/2019

CLINICAL DATA: Weakness and fevers, sepsis

EXAM:
CHEST - 2 VIEW

[w chest pa]
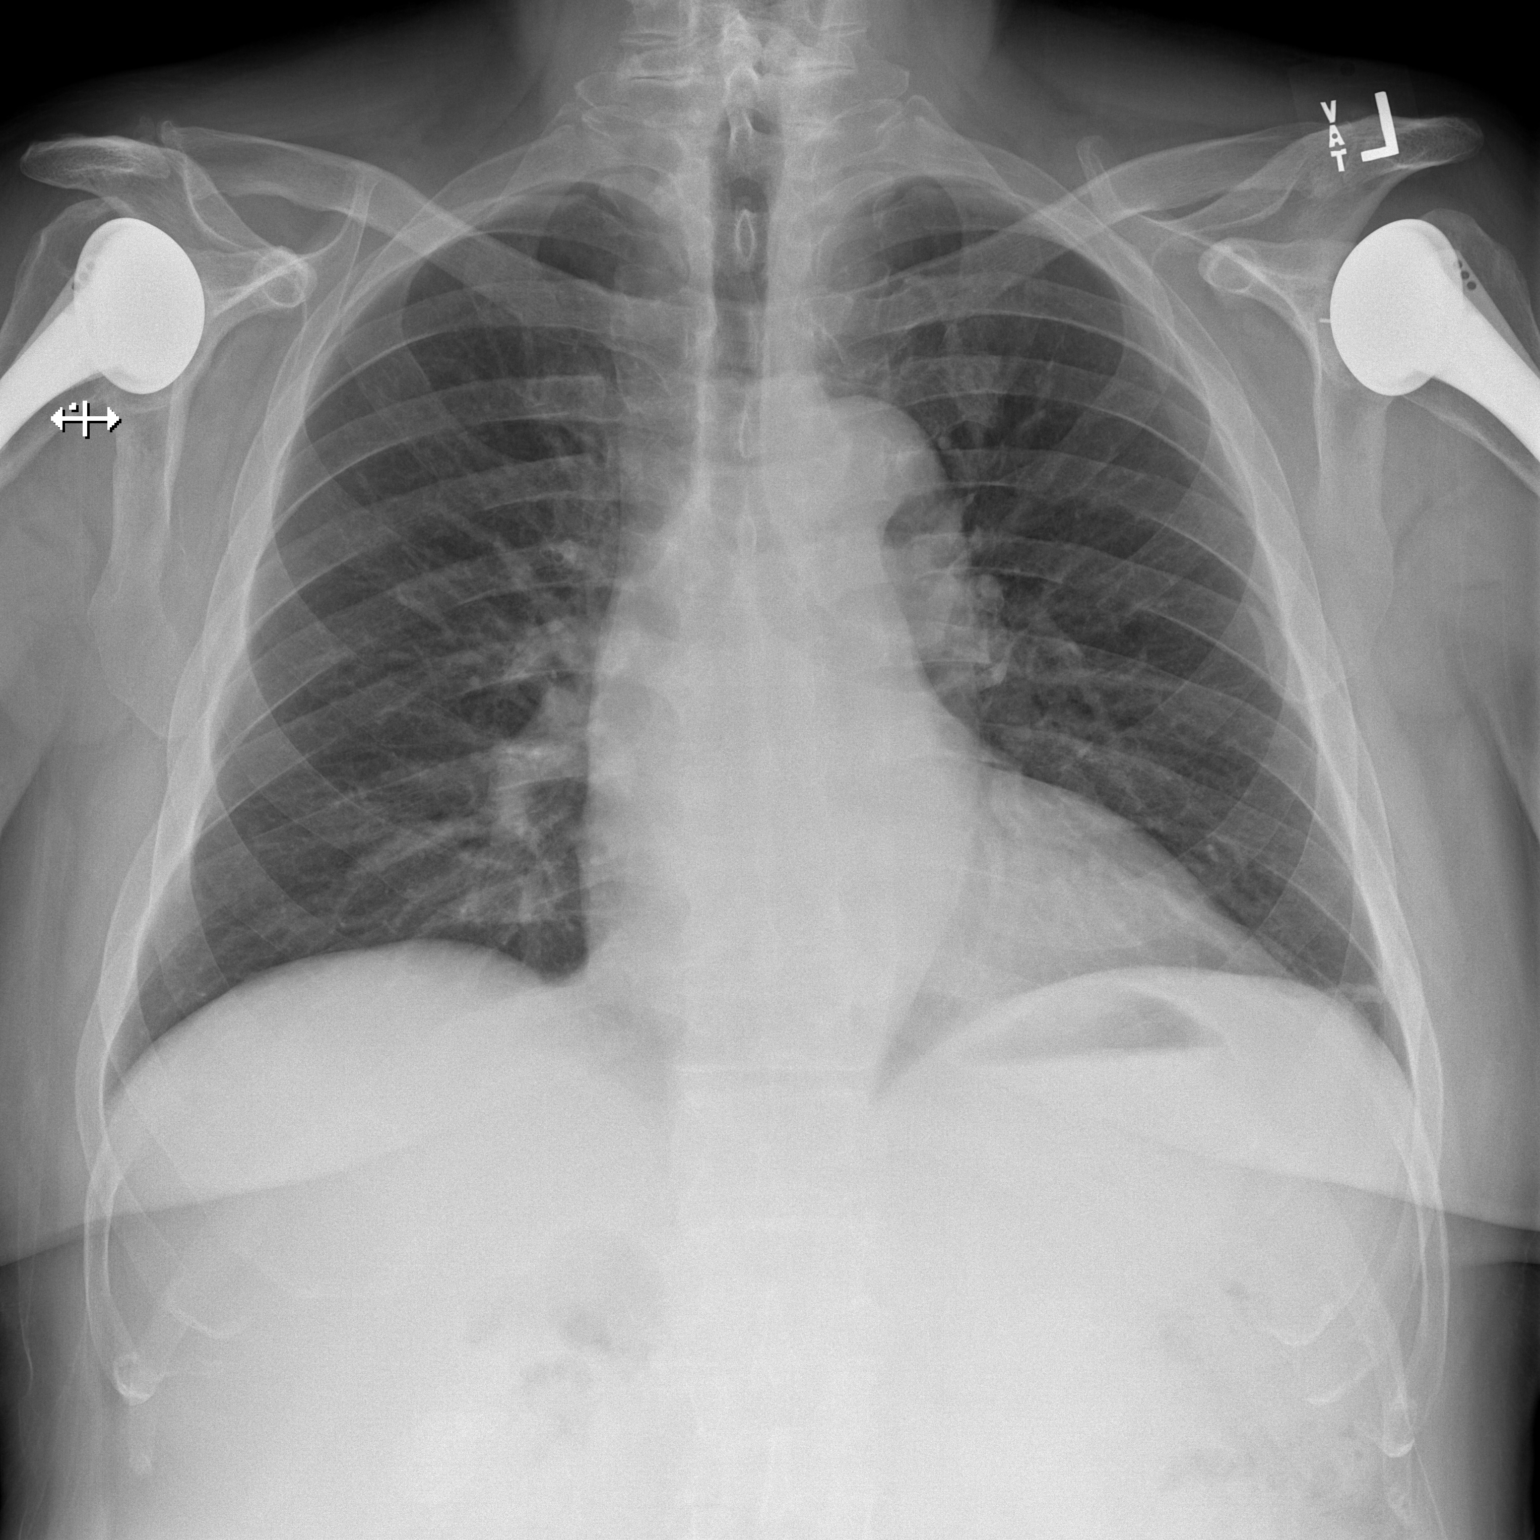

[w chest lat]
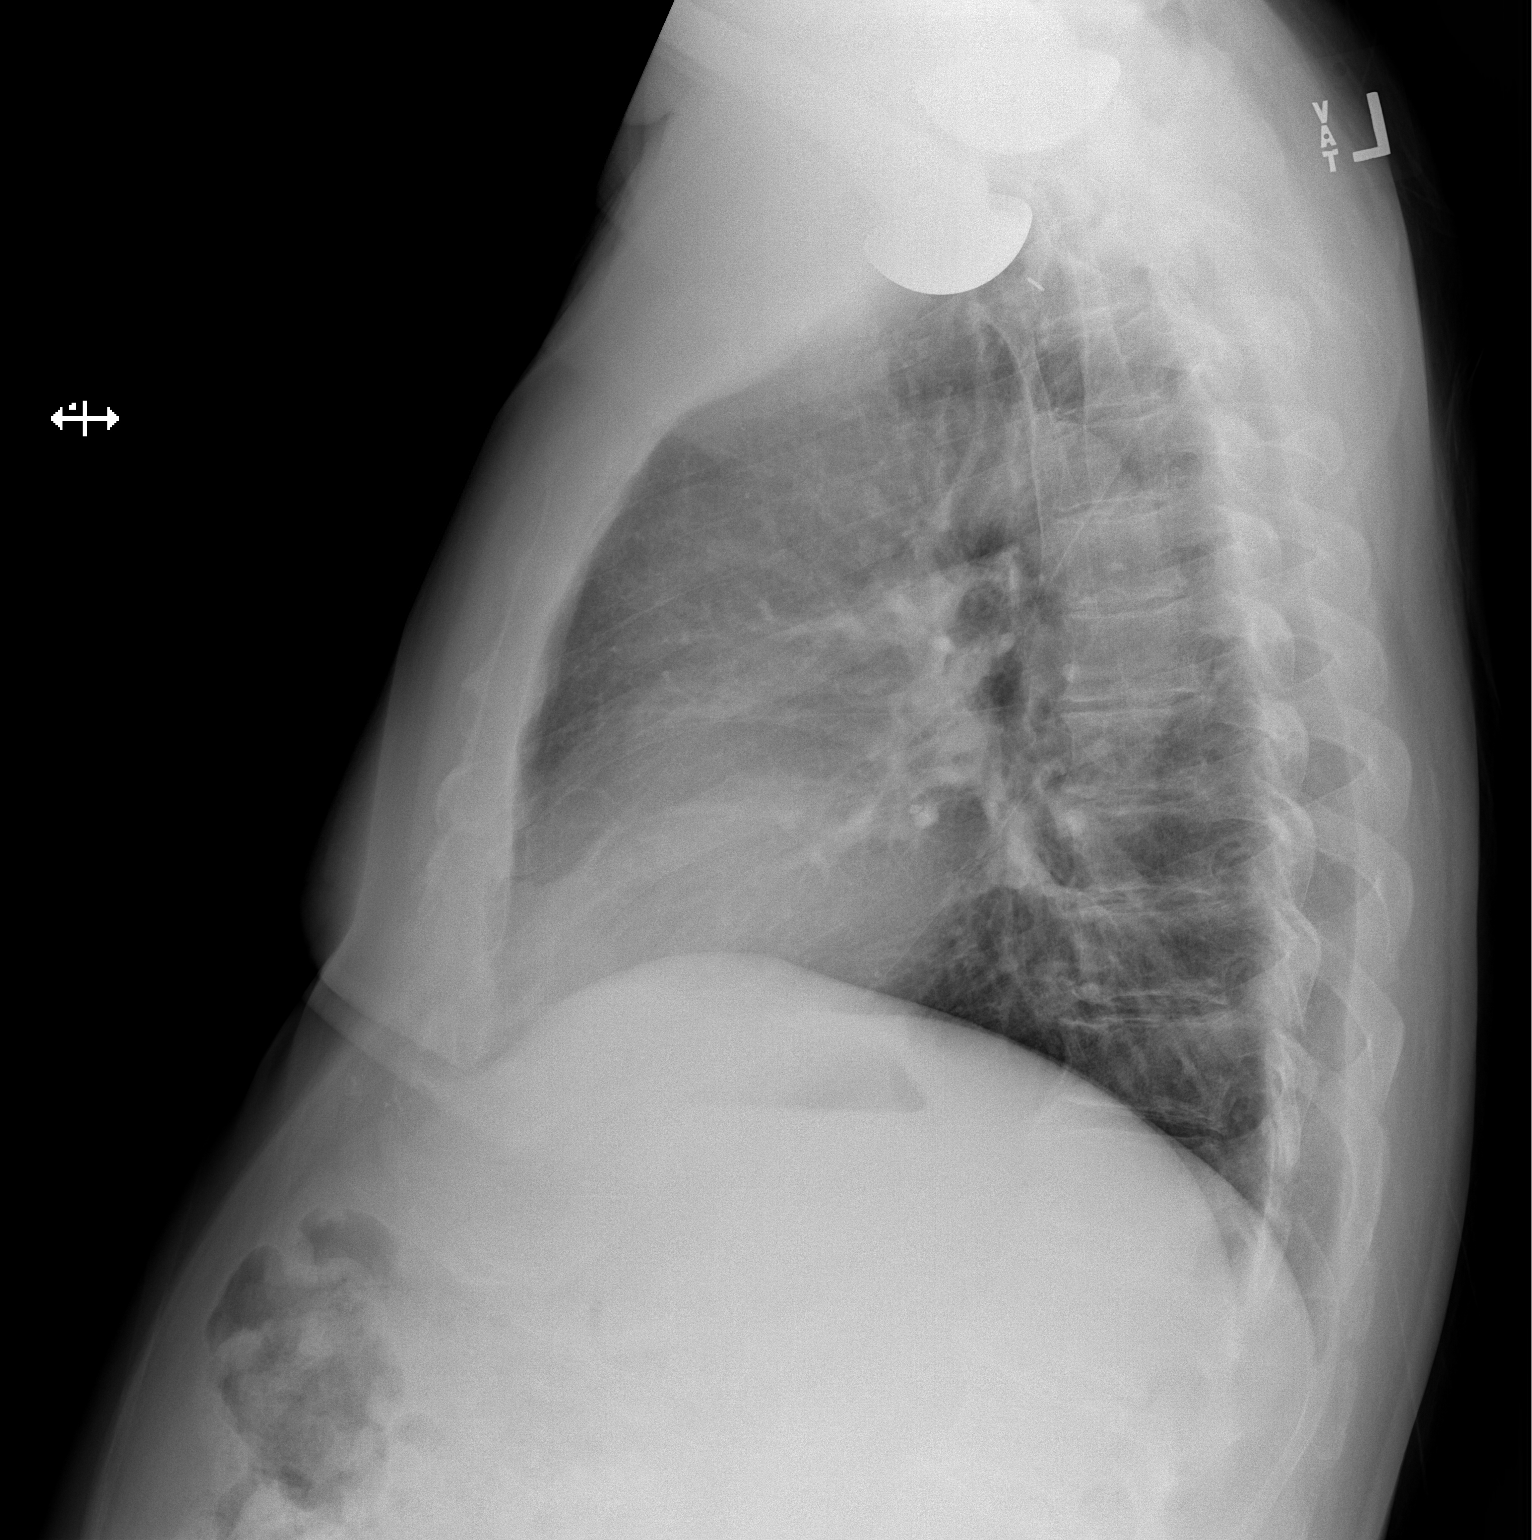

[2 of 2 positions shown; findings below may reference images not displayed]

FINDINGS: The heart size and mediastinal contours are within normal limits.
Both lungs are clear. The visualized skeletal structures are
unremarkable. Minimal left basilar scarring versus atelectasis.
Trachea midline.

Bilateral shoulder replacements noted.
IMPRESSION: No acute chest process.

## 2020-03-24 NOTE — Plan of Care (Signed)

## 2020-03-24 NOTE — Progress Notes (Signed)
PROGRESS NOTE    Billy Rasmussen  QVZ:[id number] DOB: 1946/05/06 DOA: 03/21/2020 PCP: Janora Norlander, DO  Brief Narrative:   74 year old white male TA T1 CIS bladder cancer 2015 status post BCG-most recently advanced disease Discussion regarding Keytruda versus cystectomy Cystoprostatectomy with conduit diversion 08/28/5186-[id number] complicated by pyelonephritis AKI etc.-was treated for 14 days with IV cefepime in a setting of left ureteric stricture on nephrostomy was considered Had recurrent urinary infection 9/18 through 02/20/2019 admission Also grew Klebsiella on 03/21/2019 admissiom  Last admission 12/28/2019 found to be febrile given Cipro but returned later feeling feverish Found to have gram-negative rods Klebsiella call back to the ED found to have new thrombocytopenia Treated with meropenem for urinary infection/ESBL received 3 days of meropenem and per ID was transitioned to ciprofloxacin  Re-presented to the [facility] emergency room 10/21 2021 with fevers T-max 100.6 WBC 10 sodium 129 Ultimately felt to have probable urinary infection given history of recurrences in the past Urine culture ultimately showed multiple colony-forming units without any 1 organism growing Patient has been maintained on meropenem during hospitalization and despite this had a T-max of 103 but has had low-grade fevers of 100.4  He has a h/o multiple joint replacements in the past   Assessment & Plan:   Principal Problem:   UTI (urinary tract infection) Active Problems:   HTN (hypertension)   Malignant neoplasm of urinary bladder (HCC)   ESBL (extended spectrum beta-lactamase) producing bacteria infection   1. Sepsis secondary to probable ESBL a. We will continue meropenem as low-grade fevers noted p.m. 10/24 b. Orders in place to culture blood/urine if T-max >100.5 c. Discussed with the patient and he is amenable to the same d. He tells me he has an infected L knee joint and had  to have a block and abx beads placed and another joint had to replace this [dr. Jenny Reichmann enman] e. If he spikes another fever will image L knee and consider imaging of other joints and consult ID getting a CRP and ESR 2. AKI on admission with hyponatremia a. Renal insufficiency is stable and about the same b. Sodium is improved slightly 3. Bladder cancer status post robotic cystoprostatectomy 2020 left nephrectomy 07/2019 a. Complicated urological anatomy-had left nephrectomy because strictures and hydronephrosis were thought to be risk factor for infection b. Patient is aware to discuss the same with urologis tin OP Dr. Phebe Colla 4. HTN a. Blood pressures are reasonably controlled 1 30-1 40 continue lisinopril 5 mg  DVT prophylaxis: Heparin Code Status: Full Family Communication: No family at the bedside Disposition: Await clinical stability and no further fevers prior to decision for discharge  Status is: Inpatient  Remains inpatient appropriate because:IV treatments appropriate due to intensity of illness or inability to take PO   Dispo: The patient is from: Home              Anticipated d/c is to: Home              Anticipated d/c date is: 2 days              Patient currently is not medically stable to d/c.       Consultants:   None is yet  Procedures: None  Antimicrobials: Meropenem IV   Subjective:  Low-grade temps continue no distress Tells me some L knee swelling and pain No cp no fever No stool as yet  Objective: Vitals:   03/22/20 2339 03/23/20 0529 03/23/20 1412 03/24/20 0500  BP: Marland Kitchen)  148/89 (!) 154/79 137/76 136/85  Pulse: 80 70 72 72  Resp: $Remo'20 18 19 20  'uOjKZ$ Temp: 98.7 F (37.1 C) 98.9 F (37.2 C) 98.2 F (36.8 C) 98 F (36.7 C)  TempSrc: Oral Oral Oral   SpO2: 100% 98% 94% 99%  Weight:      Height:        Intake/Output Summary (Last 24 hours) at 03/24/2020 0733 Last data filed at 03/23/2020 1700 Gross per 24 hour  Intake 720 ml  Output 2250 ml    Net -1530 ml   Filed Weights   03/21/20 1841 03/21/20 2220  Weight: 97.5 kg 100.3 kg    Examination:  No ict no pallor cta b no added sound abd soft, urostomy in place L knee is swollen and slight red   Data Reviewed: I have personally reviewed following labs and imaging studies  Sodium 134 potassium 4.3 BUNs/creatinine 21/1.3  WBC 5.9 platelet 145  Radiology Studies: No results found.   Scheduled Meds: . enoxaparin (LOVENOX) injection  40 mg Subcutaneous Q24H  . influenza vaccine adjuvanted  0.5 mL Intramuscular Tomorrow-1000  . lisinopril  5 mg Oral Daily  . senna-docusate  1 tablet Oral QHS   Continuous Infusions: . meropenem (MERREM) IV 1 g (03/24/20 0631)     LOS: 3 days    Time spent: [place], MD [facility] To contact the attending provider between 7A-7P or the covering provider during after hours 7P-7A, please log into the web site [email or web address] and access using universal [place] password for that web site. If you do not have the password, please call the hospital operator.  03/24/2020, 7:33 AM

## 2020-03-24 NOTE — Progress Notes (Signed)
Pharmacy Antibiotic Note  Billy Rasmussen is a 74 y.o. male admitted on 03/21/2020 with fever. Pt has prior hx of sepsis secondary to UTI and bladder cancer with urostomy. Pt rec'd dose of ceftriaxone 1 gm IV X 1 tonight in ED. Pt has hx of recurrent UTIs (urine cx on 12/26/19 grew ESBL Kleb pneumoniae and urine cx on 06/25/19 grew ESBL Kleb pneumoniae and Serratia marcescens; pt also had ESBL Kleb pneumoniae bacteremia in 7/21). Pharmacy has been consulted for meropenem dosing.  WBC 5.9, Afebrile. Patient improving  Plan: Continue Meropenem 1 gm IV Q 8 hrs Monitor WBC, temp, clinical improvement, cultures F/U LOT  Height: 5\' 6"  (167.6 cm) Weight: 100.3 kg (221 lb 1.9 oz) IBW/kg (Calculated) : 63.8  Temp (24hrs), Avg:98.1 F (36.7 C), Min:98 F (36.7 C), Max:98.2 F (36.8 C)  Recent Labs  Lab 03/21/20 1853 03/22/20 0421 03/24/20 0620  WBC 10.6* 9.1 5.9  CREATININE 1.46* 1.30* 1.30*  LATICACIDVEN 1.4  --   --     Estimated Creatinine Clearance: 57 mL/min (A) (by C-G formula based on SCr of 1.3 mg/dL (H)).    No Known Allergies  Antimicrobials this admission: 10/21 ceftriaxone X 1 10/21 meropenem >>  Microbiology results: 10/21 BCx X 2: ngtd 10/21 Ucx: multiple species. NTR 10/21 COVID, flu A, flu B: all negative  Thank you for allowing pharmacy to be a part of this patient's care.  Isac Sarna, BS [place], [place] Clinical Pharmacist Pager [phone] 03/24/2020 12:40 PM

## 2020-03-25 DIAGNOSIS — N3000 Acute cystitis without hematuria: Secondary | ICD-10-CM | POA: Diagnosis not present

## 2020-03-25 LAB — CBC WITH DIFFERENTIAL/PLATELET
Abs Immature Granulocytes: 0.03 10*3/uL (ref 0.00–0.07)
Basophils Absolute: 0 10*3/uL (ref 0.0–0.1)
Basophils Relative: 0 %
Eosinophils Absolute: 0.2 10*3/uL (ref 0.0–0.5)
Eosinophils Relative: 4 %
HCT: 39.7 % (ref 39.0–52.0)
Hemoglobin: 12.9 g/dL — ABNORMAL LOW (ref 13.0–17.0)
Immature Granulocytes: 1 %
Lymphocytes Relative: 20 %
Lymphs Abs: 1 10*3/uL (ref 0.7–4.0)
MCH: 29.7 pg (ref 26.0–34.0)
MCHC: 32.5 g/dL (ref 30.0–36.0)
MCV: 91.5 fL (ref 80.0–100.0)
Monocytes Absolute: 0.7 10*3/uL (ref 0.1–1.0)
Monocytes Relative: 14 %
Neutro Abs: 3.2 10*3/uL (ref 1.7–7.7)
Neutrophils Relative %: 61 %
Platelets: 168 10*3/uL (ref 150–400)
RBC: 4.34 MIL/uL (ref 4.22–5.81)
RDW: 14.3 % (ref 11.5–15.5)
WBC: 5.2 10*3/uL (ref 4.0–10.5)
nRBC: 0 % (ref 0.0–0.2)

## 2020-03-25 LAB — COMPREHENSIVE METABOLIC PANEL
ALT: 24 U/L (ref 0–44)
AST: 32 U/L (ref 15–41)
Albumin: 3.7 g/dL (ref 3.5–5.0)
Alkaline Phosphatase: 44 U/L (ref 38–126)
Anion gap: 8 (ref 5–15)
BUN: 25 mg/dL — ABNORMAL HIGH (ref 8–23)
CO2: 29 mmol/L (ref 22–32)
Calcium: 9.7 mg/dL (ref 8.9–10.3)
Chloride: 98 mmol/L (ref 98–111)
Creatinine, Ser: 1.34 mg/dL — ABNORMAL HIGH (ref 0.61–1.24)
GFR, Estimated: 56 mL/min — ABNORMAL LOW (ref >60)
Glucose, Bld: 91 mg/dL (ref 70–99)
Potassium: 4.2 mmol/L (ref 3.5–5.1)
Sodium: 135 mmol/L (ref 135–145)
Total Bilirubin: 0.6 mg/dL (ref 0.3–1.2)
Total Protein: 7.2 g/dL (ref 6.5–8.1)

## 2020-03-25 MED ORDER — SODIUM CHLORIDE 0.9 % IV SOLN
1.0000 g | Freq: Three times a day (TID) | INTRAVENOUS | Status: AC
  Administered 2020-03-25 (×2): 1 g via INTRAVENOUS
  Filled 2020-03-25 (×2): qty 1

## 2020-03-25 NOTE — Progress Notes (Signed)
PROGRESS NOTE    Billy Rasmussen  IRS:[id number] DOB: June 22, 1945 DOA: 03/21/2020 PCP: Janora Norlander, DO  Brief Narrative:   74 year old white male TA T1 CIS bladder cancer 2015 status post BCG-most recently advanced disease Discussion regarding Keytruda versus cystectomy Cystoprostatectomy with conduit diversion 0/01/3817-[id number] complicated by pyelonephritis AKI etc.-was treated for 14 days with IV cefepime in a setting of left ureteric stricture on nephrostomy was considered Had recurrent urinary infection 9/18 through 02/20/2019 admission Also grew Klebsiella on 03/21/2019 admissiom  Last admission 12/28/2019 found to be febrile given Cipro but returned later feeling feverish Found to have gram-negative rods Klebsiella call back to the ED found to have new thrombocytopenia Treated with meropenem for urinary infection/ESBL received 3 days of meropenem and per ID was transitioned to ciprofloxacin  Re-presented to the [facility] emergency room 10/21 2021 with fevers T-max 100.6 WBC 10 sodium 129 Ultimately felt to have probable urinary infection given history of recurrences in the past Urine culture ultimately showed multiple colony-forming units without any 1 organism growing  Patient has been maintained on meropenem during hospitalization and despite this had a T-max of 103 but has had low-grade fevers of 100.4  He has a h/o multiple joint replacements in the past   Assessment & Plan:   Principal Problem:   UTI (urinary tract infection) Active Problems:   HTN (hypertension)   Malignant neoplasm of urinary bladder (HCC)   ESBL (extended spectrum beta-lactamase) producing bacteria infection   1. Sepsis secondary to probable ESBL a. Continuing meropenem until 1025 then reevaluate b. No further high-grade fevers only low-grade fevers 99  c. If recurrent fevers despite negative urine cultures we will have to work out more aggressively and image as below 2. Prior  history of septic joint in the remote past a. Previous infected L knee joint and had to have a block and abx beads placed and another joint had to replace this [dr. Jenny Reichmann enman] b. Monitor temperature trends off of antibiotics after today's dose-as an new fever or issues we will need to work-up and image 3. AKI on admission with hyponatremia a. Creatinine has peaked-anticipated will drop in the next several days b. Stable at this time 4. Bladder cancer status post robotic cystoprostatectomy 2020 left nephrectomy 07/2019 a. Complicated urological anatomy-had left nephrectomy because strictures and hydronephrosis were thought to be risk factor for infection b. Patient is aware to discuss the same with urologist in OP Dr. Phebe Colla 5. HTN a. Blood pressures are reasonably controlled 130-140 continue lisinopril 5 mg  DVT prophylaxis: Heparin Code Status: Full Family Communication: No family at the bedside Disposition: Await clinical stability and no further fevers prior to decision for discharge  Status is: Inpatient  Remains inpatient appropriate because:IV treatments appropriate due to intensity of illness or inability to take PO   Dispo: The patient is from: Home              Anticipated d/c is to: Home              Anticipated d/c date is: 2 days              Patient currently is not medically stable to d/c.    Consultants:   None is yet  Procedures: None  Antimicrobials: Meropenem IV   Subjective:  Awake coherent alert no fevers overnight No chest pain Tolerating diet but did not have breakfast this morning   Objective: Vitals:   03/23/20 1412 03/24/20 0500 03/24/20 2200 03/25/20  0455  BP: 137/76 136/85 (!) 149/90 (!) 143/90  Pulse: 72 72 72 65  Resp: 19 20  19   Temp: 98.2 F (36.8 C) 98 F (36.7 C) 99.2 F (37.3 C) 98.4 F (36.9 C)  TempSrc: Oral  Oral Oral  SpO2: 94% 99% 96% 100%  Weight:      Height:        Intake/Output Summary (Last 24 hours) at  03/25/2020 0716 Last data filed at 03/24/2020 2243 Gross per 24 hour  Intake 720 ml  Output 5450 ml  Net -4730 ml   Filed Weights   03/21/20 1841 03/21/20 2220  Weight: 97.5 kg 100.3 kg    Examination:  EOMI NCAT no focal deficit no icterus no pallor Chest clear no added abdomen soft no rebound no guarding Left knee seems a little swollen but less red than prior Range of motion intact no joint line tenderness Power 5/5 grossly and neurological exam cursorily normal    Data Reviewed: I have personally reviewed following labs and imaging studies  BUNs/creatinine up from 21/1.3-25/1.3 GFR 56 Hemoglobin 12 White count 5  Radiology Studies: No results found.   Scheduled Meds: . enoxaparin (LOVENOX) injection  40 mg Subcutaneous Q24H  . influenza vaccine adjuvanted  0.5 mL Intramuscular Tomorrow-1000  . lisinopril  5 mg Oral Daily  . senna-docusate  1 tablet Oral QHS   Continuous Infusions: . meropenem (MERREM) IV 1 g (03/25/20 0518)     LOS: 4 days    Time spent: [place], MD [facility] To contact the attending provider between 7A-7P or the covering provider during after hours 7P-7A, please log into the web site [email or web address] and access using universal [place] password for that web site. If you do not have the password, please call the hospital operator.  03/25/2020, 7:16 AM

## 2020-03-25 NOTE — Plan of Care (Signed)

## 2020-03-25 NOTE — ACP (Advance Care Planning) (Signed)
Advance Directive information was given to Billy Rasmussen. We discussed some of what it entails, to complete also. Will follow up tomorrow to check on whether he'd like to complete.

## 2020-03-26 DIAGNOSIS — N3000 Acute cystitis without hematuria: Secondary | ICD-10-CM | POA: Diagnosis not present

## 2020-03-26 LAB — COMPREHENSIVE METABOLIC PANEL
ALT: 25 U/L (ref 0–44)
AST: 34 U/L (ref 15–41)
Albumin: 3.8 g/dL (ref 3.5–5.0)
Alkaline Phosphatase: 44 U/L (ref 38–126)
Anion gap: 4 — ABNORMAL LOW (ref 5–15)
BUN: 29 mg/dL — ABNORMAL HIGH (ref 8–23)
CO2: 29 mmol/L (ref 22–32)
Calcium: 9.8 mg/dL (ref 8.9–10.3)
Chloride: 101 mmol/L (ref 98–111)
Creatinine, Ser: 1.25 mg/dL — ABNORMAL HIGH (ref 0.61–1.24)
GFR, Estimated: >60 mL/min (ref >60)
Glucose, Bld: 87 mg/dL (ref 70–99)
Potassium: 4.2 mmol/L (ref 3.5–5.1)
Sodium: 134 mmol/L — ABNORMAL LOW (ref 135–145)
Total Bilirubin: 0.9 mg/dL (ref 0.3–1.2)
Total Protein: 7.6 g/dL (ref 6.5–8.1)

## 2020-03-26 LAB — CBC WITH DIFFERENTIAL/PLATELET
Abs Immature Granulocytes: 0.02 10*3/uL (ref 0.00–0.07)
Basophils Absolute: 0 10*3/uL (ref 0.0–0.1)
Basophils Relative: 0 %
Eosinophils Absolute: 0.2 10*3/uL (ref 0.0–0.5)
Eosinophils Relative: 4 %
HCT: 40.5 % (ref 39.0–52.0)
Hemoglobin: 13.7 g/dL (ref 13.0–17.0)
Immature Granulocytes: 0 %
Lymphocytes Relative: 25 %
Lymphs Abs: 1.2 10*3/uL (ref 0.7–4.0)
MCH: 30.5 pg (ref 26.0–34.0)
MCHC: 33.8 g/dL (ref 30.0–36.0)
MCV: 90.2 fL (ref 80.0–100.0)
Monocytes Absolute: 0.6 10*3/uL (ref 0.1–1.0)
Monocytes Relative: 12 %
Neutro Abs: 2.8 10*3/uL (ref 1.7–7.7)
Neutrophils Relative %: 59 %
Platelets: 176 10*3/uL (ref 150–400)
RBC: 4.49 MIL/uL (ref 4.22–5.81)
RDW: 14 % (ref 11.5–15.5)
WBC: 4.8 10*3/uL (ref 4.0–10.5)
nRBC: 0 % (ref 0.0–0.2)

## 2020-03-26 LAB — CULTURE, BLOOD (ROUTINE X 2)
Culture: NO GROWTH
Culture: NO GROWTH
Special Requests: ADEQUATE
Special Requests: ADEQUATE

## 2020-03-26 NOTE — Discharge Summary (Signed)
Physician Discharge Summary  Billy Rasmussen:[id number] DOB: [birth year] DOA: 03/21/2020  PCP: Janora Norlander, DO  Admit date: 03/21/2020 Discharge date: 03/26/2020  Time spent: 36 minutes  Recommendations for Outpatient Follow-up:  1. Will need outpatient follow-up with probably infectious disease and coordination with Dr. Bess Harvest with regards to either suppressive therapy (may be risky secondary to possible risk of super organism growing) 2. Get Chem-12 CBC 1 week 3. Outpatient follow-up with orthopedics for back pains-consider imaging which may need to be done if he continues to have fevers and this may be a source with discitis etc.--- this was not done this hospital stay because patient had no growth on any of his cultures  Discharge Diagnoses:  Principal Problem:   UTI (urinary tract infection) Active Problems:   HTN (hypertension)   Malignant neoplasm of urinary bladder (HCC)   ESBL (extended spectrum beta-lactamase) producing bacteria infection   Discharge Condition: Improved  Diet recommendation: Heart healthy  Filed Weights   03/21/20 1841 03/21/20 2220  Weight: 97.5 kg 100.3 kg    History of present illness:  74 year old white male TA T1 CIS bladder cancer 2015 status post BCG-most recently advanced disease Discussion regarding Keytruda versus cystectomy Cystoprostatectomy with conduit diversion [id number] complicated by pyelonephritis AKI etc.-was treated for 14 days with IV cefepime in a setting of left ureteric stricture on nephrostomy was considered Had recurrent urinary infection 9/18 through 02/20/2019 admission Also grew Klebsiella on 03/21/2019 admissiom  Last admission 12/28/2019 found to be febrile given Cipro but returned later feeling feverish Found to have gram-negative rods Klebsiella call back to the ED found to have new thrombocytopenia Treated with meropenem for urinary infection/ESBL received 3 days of meropenem and per ID was  transitioned to ciprofloxacin  Re-presented to the [facility] emergency room 10/21 2021 with fevers T-max 100.6 WBC 10 sodium 129 Ultimately felt to have probable urinary infection given history of recurrences in the past Urine culture ultimately showed multiple colony-forming units without any 1 organism growing  Patient has been maintained on meropenem during hospitalization and despite this had a T-max of 103 but has had low-grade fevers of 100.4  He has a h/o multiple joint replacements in the past  Hospital Course:  1. Sepsis secondary to probable ESBL a. Continuing meropenem until 1025 then reevaluate b. No further high-grade fevers only low-grade fevers 99  c. Fevers did not recur it was unlikely that he is going once his left organism-it is unclear the etiology of his recurrent urinary infections and fevers but he has had in the past septic joint d. I have mentioned he should follow-up with Dr. Megan Salon or Drucilla Schmidt who is on-call in the near future for discussion about his illnesses and he may require suppressive therapy 2. Prior history of septic joint in the remote past a. Previous infected L knee joint and had to have a block and abx beads placed and another joint had to replace this [dr. Jenny Reichmann enman] b. We monitored his fever curve and temperature for 1 day post discontinuation patient antibiotics and he did not have any fevers c. We feel he is stabilized and suspect he will do well but does need close follow-up 3. AKI on admission with hyponatremia a. Creatinine has peaked-anticipated will drop in the next several days get labs in the next week b.  c. Stable at this time 4. Bladder cancer status post robotic cystoprostatectomy 2020 left nephrectomy 07/2019 a. Complicated urological anatomy-had left nephrectomy because strictures and hydronephrosis were thought  to be risk factor for infection b. Patient is aware to discuss the same with urologist in OP Dr. Phebe Colla 5. HTN a. Blood pressures are reasonably controlled 130-140 continue lisinopril 5 mg    Discharge Exam: Vitals:   03/25/20 2109 03/26/20 0539  BP: 139/79 131/79  Pulse: 67 73  Resp: 20 20  Temp: 98.6 F (37 C) 98.2 F (36.8 C)  SpO2: 99% 99%    General: Awake coherent pleasant no reports of fever no chills no other issues and is doing well asking to go home Cardiovascular: S1-S2 no murmur no rub no gallop Respiratory: Clinically clear no added sound Abdomen soft no rebound no guarding Neurologically intact  Discharge Instructions   Discharge Instructions    Diet - low sodium heart healthy   Complete by: As directed    Discharge instructions   Complete by: As directed    Follow up with Dr. Tresa Moore for further instructions re: planning about either another procedure or suppressive antibiotics medication You will likely need follow up at infectious disease clinic--I will forward you the name of a doctor to follow with in about a month--call their office and hopefully they can set u up for follow up Get labs ion about 2 weeks at primary MD office need   Increase activity slowly   Complete by: As directed      Allergies as of 03/26/2020   No Known Allergies     Medication List    STOP taking these medications   amLODipine 10 MG tablet Commonly known as: NORVASC   ciprofloxacin 500 MG tablet Commonly known as: CIPRO   predniSONE 5 MG tablet Commonly known as: DELTASONE     TAKE these medications   atorvastatin 20 MG tablet Commonly known as: LIPITOR Take 1 tablet (20 mg total) by mouth daily.   docusate sodium 100 MG capsule Commonly known as: COLACE Take 200 mg by mouth daily.   ferrous sulfate 325 (65 FE) MG tablet Take 1 tablet (325 mg total) by mouth daily with breakfast.   lisinopril 5 MG tablet Commonly known as: ZESTRIL Take 1 tablet (5 mg total) by mouth daily. (Needs to be seen before next refill)   meclizine 25 MG tablet Commonly known  as: ANTIVERT meclizine 25 mg tablet   Multivitamin Adult Tabs Take 1 tablet by mouth daily.   OVER THE COUNTER MEDICATION Take 1 tablet by mouth daily.   polyvinyl alcohol 1.4 % ophthalmic solution Commonly known as: LIQUIFILM TEARS Place 1 drop into both eyes as needed for dry eyes.   traMADol 50 MG tablet Commonly known as: ULTRAM Take 1 tablet (50 mg total) by mouth every 12 (twelve) hours as needed (for severe pain).      No Known Allergies    The results of significant diagnostics from this hospitalization (including imaging, microbiology, ancillary and laboratory) are listed below for reference.    Significant Diagnostic Studies: XR Cervical Spine 2 or 3 views  Result Date: 03/21/2020 AP lateral cervical spine images demonstrate some disc space narrowing.  Cervical lordosis is maintained no subluxation and mild endplate spurring. Impression: Mild disc degeneration without subluxation.   Microbiology: Recent Results (from the past 240 hour(s))  Respiratory Panel by RT PCR (Flu A&B, Covid) - Nasopharyngeal Swab     Status: None   Collection Time: 03/21/20  7:07 PM   Specimen: Nasopharyngeal Swab  Result Value Ref Range Status   SARS Coronavirus 2 by RT PCR NEGATIVE NEGATIVE Final  Comment: (NOTE) SARS-CoV-2 target nucleic acids are NOT DETECTED.  The SARS-CoV-2 RNA is generally detectable in upper respiratoy specimens during the acute phase of infection. The lowest concentration of SARS-CoV-2 viral copies this assay can detect is 131 copies/mL. A negative result does not preclude SARS-Cov-2 infection and should not be used as the sole basis for treatment or other patient management decisions. A negative result may occur with  improper specimen collection/handling, submission of specimen other than nasopharyngeal swab, presence of viral mutation(s) within the areas targeted by this assay, and inadequate number of viral copies (<131 copies/mL). A negative result  must be combined with clinical observations, patient history, and epidemiological information. The expected result is Negative.  Fact Sheet for Patients:  PinkCheek.be  Fact Sheet for Healthcare Providers:  GravelBags.it  This test is no t yet approved or cleared by the [place] FDA and  has been authorized for detection and/or diagnosis of SARS-CoV-2 by FDA under an Emergency Use Authorization (EUA). This EUA will remain  in effect (meaning this test can be used) for the duration of the COVID-19 declaration under Section 564(b)(1) of the Act, 21 U.S.C. section 360bbb-3(b)(1), unless the authorization is terminated or revoked sooner.     Influenza A by PCR NEGATIVE NEGATIVE Final   Influenza B by PCR NEGATIVE NEGATIVE Final    Comment: (NOTE) The Xpert Xpress SARS-CoV-2/FLU/RSV assay is intended as an aid in  the diagnosis of influenza from Nasopharyngeal swab specimens and  should not be used as a sole basis for treatment. Nasal washings and  aspirates are unacceptable for Xpert Xpress SARS-CoV-2/FLU/RSV  testing.  Fact Sheet for Patients: PinkCheek.be  Fact Sheet for Healthcare Providers: GravelBags.it  This test is not yet approved or cleared by the [place] FDA and  has been authorized for detection and/or diagnosis of SARS-CoV-2 by  FDA under an Emergency Use Authorization (EUA). This EUA will remain  in effect (meaning this test can be used) for the duration of the  Covid-19 declaration under Section 564(b)(1) of the Act, 21  U.S.C. section 360bbb-3(b)(1), unless the authorization is  terminated or revoked. Performed at [facility], [address], [place], [place] [address]   Blood culture (routine x 2)     Status: None   Collection Time: 03/21/20  7:14 PM   Specimen: BLOOD LEFT FOREARM  Result Value Ref Range Status   Specimen Description  BLOOD LEFT FOREARM  Final   Special Requests   Final    BOTTLES DRAWN AEROBIC AND ANAEROBIC Blood Culture adequate volume   Culture   Final    NO GROWTH 5 DAYS Performed at [facility], [address], [place], [place] [address]    Report Status 03/26/2020 FINAL  Final  Blood culture (routine x 2)     Status: None   Collection Time: 03/21/20  7:24 PM   Specimen: BLOOD RIGHT HAND  Result Value Ref Range Status   Specimen Description BLOOD RIGHT HAND  Final   Special Requests   Final    BOTTLES DRAWN AEROBIC AND ANAEROBIC Blood Culture adequate volume   Culture   Final    NO GROWTH 5 DAYS Performed at [facility], [address] [place], [place] [address]    Report Status 03/26/2020 FINAL  Final  Urine culture     Status: Abnormal   Collection Time: 03/21/20  8:27 PM   Specimen: Urine, Clean Catch  Result Value Ref Range Status   Specimen Description   Final  URINE, CLEAN CATCH Performed at [facility], [address] [place], [place] [address]    Special Requests   Final    NONE Performed at [facility], [address], [place], [place] [address]    Culture MULTIPLE SPECIES PRESENT, SUGGEST RECOLLECTION (A)  Final   Report Status 03/23/2020 FINAL  Final     Labs: Basic Metabolic Panel: Recent Labs  Lab 03/21/20 1853 03/22/20 0421 03/24/20 0620 03/25/20 0650 03/26/20 0428  NA 129* 133* 134* 135 134*  K 4.2 4.1 4.3 4.2 4.2  CL 99 102 97* 98 101  CO2 22 22 29 29 29   GLUCOSE 122* 108* 94 91 87  BUN 21 18 21  25* 29*  CREATININE 1.46* 1.30* 1.30* 1.34* 1.25*  CALCIUM 9.6 9.3 9.7 9.7 9.8   Liver Function Tests: Recent Labs  Lab 03/21/20 1853 03/24/20 0620 03/25/20 0650 03/26/20 0428  AST 32 28 32 34  ALT 26 20 24 25   ALKPHOS 53 43 44 44  BILITOT 1.2 0.9 0.6 0.9  PROT 8.0 7.3 7.2 7.6  ALBUMIN 4.4 3.7 3.7 3.8   No results for input(s): LIPASE, AMYLASE in the last 168 hours. No results for input(s): AMMONIA in the last 168 hours. CBC: Recent Labs  Lab  03/21/20 1853 03/22/20 0421 03/24/20 0620 03/25/20 0650 03/26/20 0428  WBC 10.6* 9.1 5.9 5.2 4.8  NEUTROABS 8.2*  --  3.8 3.2 2.8  HGB 13.5 13.6 13.1 12.9* 13.7  HCT 39.6 40.7 39.7 39.7 40.5  MCV 89.2 90.0 91.5 91.5 90.2  PLT 150 133* 145* 168 176   Cardiac Enzymes: No results for input(s): CKTOTAL, CKMB, CKMBINDEX, TROPONINI in the last 168 hours. BNP: BNP (last 3 results) No results for input(s): BNP in the last 8760 hours.  ProBNP (last 3 results) No results for input(s): PROBNP in the last 8760 hours.  CBG: No results for input(s): GLUCAP in the last 168 hours.     Signed:  Nita Sells MD   [facility] 03/26/2020, 2:59 PM

## 2020-04-02 DIAGNOSIS — Z936 Other artificial openings of urinary tract status: Secondary | ICD-10-CM | POA: Diagnosis not present

## 2020-04-02 DIAGNOSIS — Z905 Acquired absence of kidney: Secondary | ICD-10-CM | POA: Diagnosis not present

## 2020-04-02 DIAGNOSIS — C678 Malignant neoplasm of overlapping sites of bladder: Secondary | ICD-10-CM | POA: Diagnosis not present

## 2020-04-02 DIAGNOSIS — N10 Acute tubulo-interstitial nephritis: Secondary | ICD-10-CM | POA: Diagnosis not present

## 2020-04-03 ENCOUNTER — Other Ambulatory Visit: Payer: Self-pay

## 2020-04-03 ENCOUNTER — Ambulatory Visit (INDEPENDENT_AMBULATORY_CARE_PROVIDER_SITE_OTHER): Payer: Medicare Other | Admitting: Infectious Disease

## 2020-04-03 ENCOUNTER — Encounter: Payer: Self-pay | Admitting: Infectious Disease

## 2020-04-03 VITALS — BP 122/89 | HR 89 | Temp 98.3°F | Resp 16 | Ht 66.0 in | Wt 219.0 lb

## 2020-04-03 DIAGNOSIS — Z906 Acquired absence of other parts of urinary tract: Secondary | ICD-10-CM

## 2020-04-03 DIAGNOSIS — Z96651 Presence of right artificial knee joint: Secondary | ICD-10-CM

## 2020-04-03 DIAGNOSIS — C679 Malignant neoplasm of bladder, unspecified: Secondary | ICD-10-CM

## 2020-04-03 DIAGNOSIS — B961 Klebsiella pneumoniae [K. pneumoniae] as the cause of diseases classified elsewhere: Secondary | ICD-10-CM

## 2020-04-03 DIAGNOSIS — Z9079 Acquired absence of other genital organ(s): Secondary | ICD-10-CM

## 2020-04-03 DIAGNOSIS — Z96653 Presence of artificial knee joint, bilateral: Secondary | ICD-10-CM

## 2020-04-03 DIAGNOSIS — A499 Bacterial infection, unspecified: Secondary | ICD-10-CM

## 2020-04-03 DIAGNOSIS — N390 Urinary tract infection, site not specified: Secondary | ICD-10-CM

## 2020-04-03 DIAGNOSIS — N3080 Other cystitis without hematuria: Secondary | ICD-10-CM

## 2020-04-03 DIAGNOSIS — R7881 Bacteremia: Secondary | ICD-10-CM

## 2020-04-03 DIAGNOSIS — Z905 Acquired absence of kidney: Secondary | ICD-10-CM

## 2020-04-03 DIAGNOSIS — A419 Sepsis, unspecified organism: Secondary | ICD-10-CM

## 2020-04-03 DIAGNOSIS — Z1612 Extended spectrum beta lactamase (ESBL) resistance: Secondary | ICD-10-CM

## 2020-04-03 MED ORDER — CIPROFLOXACIN HCL 500 MG PO TABS: ORAL_TABLET | ORAL | 3 refills | Status: DC

## 2020-04-03 NOTE — Progress Notes (Signed)
Subjective:  Chief complaint follow-up for febrile illness while in the hospital.  Patient ID: Billy Rasmussen, male    DOB: 06-07-1945, 74 y.o.   MRN: [id number]  HPI  74 year old white male TA T1 CIS bladder cancer 2015 status post BCG.  Seen by my partner Dr. 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  While he did not grow mycobacteria on AFB cultures he responded to isoniazid treatment.  He also has a history of prior prosthetic joint infection that was also treated by Dr. Megan Salon in 2008.  There was apparently Discussion regarding Keytruda versus cystectomy  He ultimately underwent Cystoprostatectomy with conduit diversion 11/09/2018  [id number] complicated by pyelonephritis Pseudomonas aeruginosa etc.-was treated for 14 days with IV cefepime in a setting of left ureteric stricture on nephrostomy was considered  He was seen by my partner Dr. Baxter Flattery at that time.  Nephrostomy tube was placed.  In January 2021 he was again admitted and found to have urinary tract infection complicated by life left hydronephrosis and distal partial ureteral stricture and an atrophic left kidney.  Renogram had shown essentially nonfunctioning kidney.  Plans were made for a left nephrectomy in the elective setting.      Had recurrent urinary infection 9/18 through 02/20/2019 admission Also grew Klebsiella on 03/21/2019 admission  Dr. Tresa Moore had seen the patient and removed an nephroureteral stent on 19 October.  He warned the patient might require open revision if this failed with potential further interposition of bowel in an elective surgery.  Last admission 12/28/2019 found to be febrile given Cipro but returned later feeling feverish Found to have gram-negative rods Klebsiella call back to the ED found to have new thrombocytopenia Treated with meropenem for urinary infection/ESBL received 3 days of meropenem and seen again by Dr. Christella Noa and was transitioned to  ciprofloxacin  Re-presented to the [facility] emergency room 10/21 2021 with fevers T-max 100.6 WBC 10 sodium 129 Ultimately felt to have probable urinary infection given history of recurrences in the past Urine culture ultimately showed multiple colony-forming units without any 1 organism growing  Patient was given a course ofmeropenem during hospitalization and despite this had a T-max of 103 but has had low-grade fevers of 100.4.  He was then taken off antibiotics and observed and did well.  He presents for follow-up with [place].  Unfortunately do not really have anything to suppress him with given his extended spectrum beta-lactamase producing organism and I would fear losing the ability of use fluoroquinolones if he were placed chronically on this in addition to fearing all the consequences of chronic or any fluoroquinolone use.  He has bilateral prosthetic knees as mentioned 1 was treated for an infection in the past both have pain at times when he walks but he does not have any pain in either knee at rest.  Neither is especially warm I am not concerned about infection in either of these sites.  He tells me he can tell when he is developing a urinary infection by symptoms which will often include feeling weak and also having pain in his area around his conduit.     Past Medical History:  Diagnosis Date  . Arthritis    knees, shoulders, elbows  . Bladder cancer [facility])    urologist-  dr Junious Silk  . Diverticulosis of colon   . Fibromyalgia   . GERD (gastroesophageal reflux disease)   . History of diverticulitis of colon   . History of gastric ulcer  due to aleve  . Hypertension   . Lower urinary tract symptoms (LUTS)   . OSA (obstructive sleep apnea)    NON-COMPLIANT  CPAP  --- BUT PT USES OXYGEN AT NIGHT 2.5L VIA Twilight (PT'S DECISION)  . PONV (postoperative nausea and vomiting)   . Psoriasis   . Sepsis [facility])    january 2021  . Tinnitus    right ear more, has tranmitter in  right ear removable at hs  . Wears glasses   . Wears partial dentures     Past Surgical History:  Procedure Laterality Date  . APPENDECTOMY    . COLECTOMY W/ COLOSTOMY  1996   W/   APPENDECTOMY  . COLONOSCOPY N/A 05/11/2018   Procedure: COLONOSCOPY;  Surgeon: Daneil Dolin, MD;  [facility];  Service: Endoscopy;  Laterality: N/A;  9:30  . COLOSTOMY TAKEDOWN  1996  . CYSTOSCOPY WITH BIOPSY N/A 12/05/2013   Procedure: CYSTO BLADDER BIOPSY AND FULGERATION;  Surgeon: Festus Aloe, MD;  Location: [facility];  Service: Urology;  Laterality: N/A;  . CYSTOSCOPY WITH BIOPSY Bilateral 11/13/2014   Procedure: CYSTOSCOPY WITH  BLADDER BIOPSY FULGERATION AND BILATERAL RETROGRADE PYELOGRAMS;  Surgeon: Festus Aloe, MD;  Location: [facility];  Service: Urology;  Laterality: Bilateral;  . CYSTOSCOPY WITH FULGERATION N/A 01/18/2018   Procedure: CYSTOSCOPY WITH FULGERATION/ BLADDER BIOPSY;  Surgeon: Festus Aloe, MD;  Location[facility];  Service: Urology;  Laterality: N/A;  . CYSTOSCOPY WITH INJECTION N/A 11/09/2018   Procedure: CYSTOSCOPY WITH INJECTION OF INDOCYANINE GREEN DYE;  Surgeon: Alexis Frock, MD;  Location: [facility];  Service: Urology;  Laterality: N/A;  . CYSTOSCOPY WITH INSERTION OF UROLIFT N/A 01/18/2018   Procedure: CYSTOSCOPY WITH INSERTION OF UROLIFT;  Surgeon: Festus Aloe, MD;  Location[facility];  Service: Urology;  Laterality: N/A;  . CYSTOSCOPY/URETEROSCOPY/HOLMIUM LASER Left 02/17/2019   Procedure: URETEROSCOPY WITH BALLOON DILATION  AND NEPHROSTOGRAM;  Surgeon: Alexis Frock, MD;  Location: [facility];  Service: Urology;  Laterality: Left;  1 HR  . EXCISION RIGHT UPPER ARM LIPOMA  2005  . HEMORROIDECTOMY    . INGUINAL HERNIA REPAIR Left 1984  . IR NEPHROSTOMY PLACEMENT LEFT  01/05/2019  . KNEE ARTHROSCOPY Left X3  LAST ONE  2002  . ORIF LEFT HUMEROUS FX  1976  .  POLYPECTOMY  05/11/2018   Procedure: POLYPECTOMY;  Surgeon: Daneil Dolin, MD;  [facility];  Service: Endoscopy;;  . PROSTATE SURGERY    . ROBOT ASSISTED LAPAROSCOPIC NEPHRECTOMY Left 07/14/2019   Procedure: XI ROBOTIC ASSISTED LAPAROSCOPIC RETROPERITONEAL NEPHRECTOMY;  Surgeon: Alexis Frock, MD;  Location: [facility];  Service: Urology;  Laterality: Left;  3 HRS  . TONSILLECTOMY  AS CHILD  . TOTAL KNEE ARTHROPLASTY Left 2006   REVISION 2007  (AFTER I & D WITH ANTIBIOTIC SPACER PROCEDURE FOR STEPH INFECTION)  . TOTAL KNEE ARTHROPLASTY Right 03/30/2016   Procedure: RIGHT TOTAL KNEE ARTHROPLASTY;  Surgeon: Paralee Cancel, MD;  Location: [facility];  Service: Orthopedics;  Laterality: Right;  . TOTAL SHOULDER ARTHROPLASTY Right 04/06/2013   Procedure: RIGHT TOTAL SHOULDER ARTHROPLASTY;  Surgeon: Marin Shutter, MD;  Location: [place];  Service: Orthopedics;  Laterality: Right;  . TOTAL SHOULDER ARTHROPLASTY Left 07/20/2013   Procedure: LEFT TOTAL SHOULDER ARTHROPLASTY;  Surgeon: Marin Shutter, MD;  Location: [place];  Service: Orthopedics;  Laterality: Left;  . TRANSURETHRAL RESECTION OF BLADDER TUMOR N/A 11/12/2015   Procedure: TRANSURETHRAL RESECTION  OF BLADDER TUMOR (TURBT);  Surgeon: Festus Aloe, MD;  Location: [facility];  Service: Urology;  Laterality: N/A;  . TRANSURETHRAL RESECTION OF BLADDER TUMOR WITH GYRUS (TURBT-GYRUS) N/A 12/05/2013   Procedure: TRANSURETHRAL RESECTION OF BLADDER TUMOR WITH GYRUS (TURBT-GYRUS);  Surgeon: Festus Aloe, MD;  Location: [facility];  Service: Urology;  Laterality: N/A;    Family History  Problem Relation Age of Onset  . Alzheimer's disease Mother   . Heart attack Father   . Heart disease Father   . Irregular heart beat Brother        DEFIB.  . Congestive Heart Failure Brother   . Diabetes Daughter       Social History   Socioeconomic History  . Marital status: Married    Spouse name: Vickii Chafe  . Number of  children: 2  . Years of education: Not on file  . Highest education level: Associate degree: occupational, Hotel manager, or vocational program  Occupational History  . 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 Smoking status: Former Smoker    Packs/day: 2.00    Years: 40.00    Pack years: 80.00    Types: Cigarettes    Quit date: 06/01/1997    Years since quitting: 22.8  . Smokeless tobacco: Never Used  Vaping Use  . Vaping Use: Never used  Substance and Sexual Activity  . Alcohol use: No  . Drug use: No  . Sexual activity: Not Currently  Other Topics Concern  . Not on file  Social History Narrative  . Not on file   Social Determinants of Health   Financial Resource Strain:   . Difficulty of Paying Living Expenses: Not on file  Food Insecurity:   . Worried About Charity fundraiser in the Last Year: Not on file  . Ran Out of Food in the Last Year: Not on file  Transportation Needs:   . Lack of Transportation (Medical): Not on file  . Lack of Transportation (Non-Medical): Not on file  Physical Activity:   . Days of Exercise per Week: Not on file  . Minutes of Exercise per Session: Not on file  Stress:   . Feeling of Stress : Not on file  Social Connections:   . Frequency of Communication with Friends and Family: Not on file  . Frequency of Social Gatherings with Friends and Family: Not on file  . Attends Religious Services: Not on file  . Active Member of Clubs or Organizations: Not on file  . Attends Archivist Meetings: Not on file  . Marital Status: Not on file    No Known Allergies   Current Outpatient Medications:  .  atorvastatin (LIPITOR) 20 MG tablet, Take 1 tablet (20 mg total) by mouth daily. (Patient not taking: Reported on 03/21/2020), Disp: 90 tablet, Rfl: 1 .  docusate sodium (COLACE) 100 MG capsule, Take 200 mg by mouth daily. , Disp: , Rfl:  .  ferrous sulfate 325 (65 FE) MG tablet, Take 1 tablet (325 mg  total) by mouth daily with breakfast. (Patient not taking: Reported on 01/24/2020), Disp: 30 tablet, Rfl: 0 .  lisinopril (ZESTRIL) 5 MG tablet, Take 1 tablet (5 mg total) by mouth daily. (Needs to be seen before next refill), Disp: 30 tablet, Rfl: 0 .  meclizine (ANTIVERT) 25 MG tablet, meclizine 25 mg tablet, Disp: , Rfl:  .  Multiple Vitamin (MULTIVITAMIN ADULT) TABS, Take 1 tablet by mouth daily.,  Disp: , Rfl:  .  OVER THE COUNTER MEDICATION, Take 1 tablet by mouth daily. (Patient not taking: Reported on 01/24/2020), Disp: , Rfl:  .  polyvinyl alcohol (LIQUIFILM TEARS) 1.4 % ophthalmic solution, Place 1 drop into both eyes as needed for dry eyes., Disp: 15 mL, Rfl: 0 .  traMADol (ULTRAM) 50 MG tablet, Take 1 tablet (50 mg total) by mouth every 12 (twelve) hours as needed (for severe pain). (Patient not taking: Reported on 03/21/2020), Disp: 60 tablet, Rfl: 0   Review of Systems  Constitutional: Negative for activity change, appetite change, chills, diaphoresis, fatigue, fever and unexpected weight change.  HENT: Negative for congestion, rhinorrhea, sinus pressure, sneezing, sore throat and trouble swallowing.   Eyes: Negative for photophobia and visual disturbance.  Respiratory: Negative for cough, chest tightness, shortness of breath, wheezing and stridor.   Cardiovascular: Negative for chest pain, palpitations and leg swelling.  Gastrointestinal: Negative for abdominal distention, abdominal pain, anal bleeding, blood in stool, constipation, diarrhea, nausea and vomiting.  Genitourinary: Negative for difficulty urinating, dysuria, flank pain and hematuria.  Musculoskeletal: Negative for arthralgias, back pain, gait problem, joint swelling and myalgias.  Skin: Negative for color change, pallor, rash and wound.  Neurological: Negative for dizziness, tremors, weakness and light-headedness.  Hematological: Negative for adenopathy. Does not bruise/bleed easily.  Psychiatric/Behavioral: Negative  for agitation, behavioral problems, confusion, decreased concentration, dysphoric mood and sleep disturbance.       Objective:   Physical Exam Constitutional:      General: He is not in acute distress.    Appearance: Normal appearance. He is well-developed. He is not ill-appearing or diaphoretic.  HENT:     Head: Normocephalic and atraumatic.     Right Ear: Hearing and external ear normal.     Left Ear: Hearing and external ear normal.     Nose: No nasal deformity or rhinorrhea.  Eyes:     General: No scleral icterus.    Conjunctiva/sclera: Conjunctivae normal.     Right eye: Right conjunctiva is not injected.     Left eye: Left conjunctiva is not injected.     Pupils: Pupils are equal, round, and reactive to light.  Neck:     Vascular: No JVD.  Cardiovascular:     Rate and Rhythm: Normal rate and regular rhythm.     Heart sounds: Normal heart sounds, S1 normal and S2 normal. No murmur heard.  No friction rub.  Pulmonary:     Effort: Pulmonary effort is normal. No respiratory distress.     Breath sounds: Normal breath sounds. No stridor. No wheezing, rhonchi or rales.  Abdominal:     General: Bowel sounds are normal. There is no distension.     Palpations: Abdomen is soft. There is no mass.     Tenderness: There is no abdominal tenderness.  Musculoskeletal:        General: Normal range of motion.     Right shoulder: Normal.     Left shoulder: Normal.     Cervical back: Normal range of motion and neck supple.     Right hip: Normal.     Left hip: Normal.     Right knee: Normal.     Left knee: Normal.  Lymphadenopathy:     Head:     Right side of head: No submandibular, preauricular or posterior auricular adenopathy.     Left side of head: No submandibular, preauricular or posterior auricular adenopathy.     Cervical: No cervical adenopathy.  Right cervical: No superficial or deep cervical adenopathy.    Left cervical: No superficial or deep cervical adenopathy.    Skin:    General: Skin is warm and dry.     Coloration: Skin is not pale.     Findings: No abrasion, bruising, ecchymosis, erythema, lesion or rash.     Nails: There is no clubbing.  Neurological:     Mental Status: He is alert and oriented to person, place, and time.     Sensory: No sensory deficit.     Coordination: Coordination normal.     Gait: Gait normal.  Psychiatric:        Attention and Perception: He is attentive.        Mood and Affect: Mood normal.        Speech: Speech normal.        Behavior: Behavior normal. Behavior is cooperative.        Thought Content: Thought content normal.        Judgment: Judgment normal.    Both knees have scars from total knee replacement neither is tender or deformed.      Assessment & Plan:  Recurrent urinary tract infections in the context of complex anatomy after Cystoprostatectomy with conduit diversion 11/09/2018 strictures says placement of stents  I have sent in a prescription for ciprofloxacin which I have asked him to start when he has the beginnings of symptoms of a urinary tract infection.  Think it is critical that he follows up with Dr. Tresa Moore from Urology.  Bladder cancer: appears in remission  Hx of PJIl: cured

## 2020-04-10 ENCOUNTER — Encounter: Payer: Self-pay | Admitting: Family Medicine

## 2020-04-10 ENCOUNTER — Ambulatory Visit (INDEPENDENT_AMBULATORY_CARE_PROVIDER_SITE_OTHER): Payer: Medicare Other | Admitting: Family Medicine

## 2020-04-10 ENCOUNTER — Other Ambulatory Visit: Payer: Self-pay

## 2020-04-10 VITALS — BP 102/79 | HR 107 | Temp 97.6°F | Ht 66.0 in | Wt 219.2 lb

## 2020-04-10 DIAGNOSIS — L219 Seborrheic dermatitis, unspecified: Secondary | ICD-10-CM | POA: Diagnosis not present

## 2020-04-10 DIAGNOSIS — Z905 Acquired absence of kidney: Secondary | ICD-10-CM | POA: Diagnosis not present

## 2020-04-10 DIAGNOSIS — Z1612 Extended spectrum beta lactamase (ESBL) resistance: Secondary | ICD-10-CM | POA: Diagnosis not present

## 2020-04-10 DIAGNOSIS — Z87440 Personal history of urinary (tract) infections: Secondary | ICD-10-CM | POA: Diagnosis not present

## 2020-04-10 DIAGNOSIS — A499 Bacterial infection, unspecified: Secondary | ICD-10-CM | POA: Diagnosis not present

## 2020-04-10 DIAGNOSIS — Z09 Encounter for follow-up examination after completed treatment for conditions other than malignant neoplasm: Secondary | ICD-10-CM | POA: Diagnosis not present

## 2020-04-10 DIAGNOSIS — Z9079 Acquired absence of other genital organ(s): Secondary | ICD-10-CM

## 2020-04-10 DIAGNOSIS — N390 Urinary tract infection, site not specified: Secondary | ICD-10-CM

## 2020-04-10 DIAGNOSIS — Z23 Encounter for immunization: Secondary | ICD-10-CM

## 2020-04-10 DIAGNOSIS — Z906 Acquired absence of other parts of urinary tract: Secondary | ICD-10-CM | POA: Diagnosis not present

## 2020-04-10 MED ORDER — LISINOPRIL 5 MG PO TABS
5.0000 mg | ORAL_TABLET | Freq: Every day | ORAL | 3 refills | Status: DC
Start: 2020-04-10 — End: 2021-05-08

## 2020-04-10 MED ORDER — KETOCONAZOLE 2 % EX SHAM: MEDICATED_SHAMPOO | CUTANEOUS | 0 refills | Status: DC

## 2020-04-10 NOTE — Patient Instructions (Signed)
Seborrheic Dermatitis, Adult Seborrheic dermatitis is a skin disease that causes red, scaly patches. It usually occurs on the scalp, and it is often called dandruff. The patches may appear on other parts of the body. Skin patches tend to appear where there are many oil glands in the skin. Areas of the body that are commonly affected include:  Scalp.  Skin folds of the body.  Ears.  Eyebrows.  Neck.  Face.  Armpits.  The bearded area of men's faces. The condition may come and go for no known reason, and it is often long-lasting (chronic). What are the causes? The cause of this condition is not known. What increases the risk? This condition is more likely to develop in people who:  Have certain conditions, such as: ? HIV (human immunodeficiency virus). ? AIDS (acquired immunodeficiency syndrome). ? Parkinson disease. ? Mood disorders, such as depression.  Are 40-60 years old. What are the signs or symptoms? Symptoms of this condition include:  Thick scales on the scalp.  Redness on the face or in the armpits.  Skin that is flaky. The flakes may be white or yellow.  Skin that seems oily or dry but is not helped with moisturizers.  Itching or burning in the affected areas. How is this diagnosed? This condition is diagnosed with a medical history and physical exam. A sample of your skin may be tested (skin biopsy). You may need to see a skin specialist (dermatologist). How is this treated? There is no cure for this condition, but treatment can help to manage the symptoms. You may get treatment to remove scales, lower the risk of skin infection, and reduce swelling or itching. Treatment may include:  Creams that reduce swelling and irritation (steroids).  Creams that reduce skin yeast.  Medicated shampoo, soaps, moisturizing creams, or ointments.  Medicated moisturizing creams or ointments. Follow these instructions at home:  Apply over-the-counter and prescription  medicines only as told by your health care provider.  Use any medicated shampoo, soaps, skin creams, or ointments only as told by your health care provider.  Keep all follow-up visits as told by your health care provider. This is important. Contact a health care provider if:  Your symptoms do not improve with treatment.  Your symptoms get worse.  You have new symptoms. This information is not intended to replace advice given to you by your health care provider. Make sure you discuss any questions you have with your health care provider. Document Revised: 04/30/2017 Document Reviewed: 09/05/2015 Elsevier Patient Education  2020 [employer]  

## 2020-04-10 NOTE — Progress Notes (Signed)
Subjective: CC: Hospital discharge follow-up PCP: Janora Norlander, DO YJE:HUDJSHF W Billy Rasmussen is a 74 y.o. male presenting to clinic today for:  1.  Hospital discharge follow-up for urosepsis/recurrent urinary tract infection Patient was hospitalized for recurrent urinary tract infection causing urosepsis, complicated by bladder cancer and advanced disease.  He was treated with antibiotics intravenously and subsequently was evaluated by infectious disease on an outpatient setting.  Outpatient follow-up with orthopedics was recommended for ongoing back pain.  However, patient had no bacterial growth on any of the cultures. ?  Discitis?  He saw Dr. Drucilla Schmidt on the third.  He was placed on oral ciprofloxacin to use as needed UTI symptoms.  He was also instructed to follow-up with urology.  Patient reports that he is doing well since discharge from the hospital.  He has seen both urology and Dr. Drucilla Schmidt.  He has not got his Cipro because the pharmacy was out of his medication.  He denies any urinary symptoms.  His bag looked clear recently and was checked by his urologist.  No recurrent fevers.  He has had some intermittent right lower quadrant pain that seems to be prominent when he stands for prolonged amounts of time but relieved by sitting.  He reports to me today several month history of itchy, scaly scalp that is refractory to OTC medications.  He is asking for something prescription today.  ROS: Per HPI  No Known Allergies Past Medical History:  Diagnosis Date  . Arthritis    knees, shoulders, elbows  . Bladder cancer [facility])    urologist-  dr Junious Silk  . Diverticulosis of colon   . Fibromyalgia   . GERD (gastroesophageal reflux disease)   . History of diverticulitis of colon   . History of gastric ulcer    due to aleve  . Hypertension   . Lower urinary tract symptoms (LUTS)   . OSA (obstructive sleep apnea)    NON-COMPLIANT  CPAP  --- BUT PT USES OXYGEN AT NIGHT 2.5L VIA [place]  (PT'S DECISION)  . PONV (postoperative nausea and vomiting)   . Psoriasis   . Sepsis [facility])    january 2021  . Tinnitus    right ear more, has tranmitter in right ear removable at hs  . Wears glasses   . Wears partial dentures     Current Outpatient Medications:  .  ciprofloxacin (CIPRO) 500 MG tablet, When you have FIRST symptoms of urinary infection start ciprofloxacin 500mg  twice daily for 7 days, Disp: 14 tablet, Rfl: 3 .  docusate sodium (COLACE) 100 MG capsule, Take 200 mg by mouth daily. , Disp: , Rfl:  .  ferrous sulfate 325 (65 FE) MG tablet, Take 1 tablet (325 mg total) by mouth daily with breakfast., Disp: 30 tablet, Rfl: 0 .  lisinopril (ZESTRIL) 5 MG tablet, Take 1 tablet (5 mg total) by mouth daily. (Needs to be seen before next refill), Disp: 30 tablet, Rfl: 0 .  meclizine (ANTIVERT) 25 MG tablet, meclizine 25 mg tablet, Disp: , Rfl:  .  Multiple Vitamin (MULTIVITAMIN ADULT) TABS, Take 1 tablet by mouth daily., Disp: , Rfl:  .  OVER THE COUNTER MEDICATION, Take 1 tablet by mouth daily. , Disp: , Rfl:  .  polyvinyl alcohol (LIQUIFILM TEARS) 1.4 % ophthalmic solution, Place 1 drop into both eyes as needed for dry eyes., Disp: 15 mL, Rfl: 0 Social History   Socioeconomic History  . Marital status: Married    Spouse name: Billy Rasmussen  . Number  of children: 2  . Years of education: Not on file  . Highest education level: Associate degree: occupational, Hotel manager, or vocational program  Occupational History  . 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 Smoking status: Former Smoker    Packs/day: 2.00    Years: 40.00    Pack years: 80.00    Types: Cigarettes    Quit date: 06/01/1997    Years since quitting: 22.8  . Smokeless tobacco: Never Used  Vaping Use  . Vaping Use: Never used  Substance and Sexual Activity  . Alcohol use: No  . Drug use: No  . Sexual activity: Not Currently  Other Topics Concern  . Not on file  Social History  Narrative  . Not on file   Social Determinants of Health   Financial Resource Strain:   . Difficulty of Paying Living Expenses: Not on file  Food Insecurity:   . Worried About Charity fundraiser in the Last Year: Not on file  . Ran Out of Food in the Last Year: Not on file  Transportation Needs:   . Lack of Transportation (Medical): Not on file  . Lack of Transportation (Non-Medical): Not on file  Physical Activity:   . Days of Exercise per Week: Not on file  . Minutes of Exercise per Session: Not on file  Stress:   . Feeling of Stress : Not on file  Social Connections:   . Frequency of Communication with Friends and Family: Not on file  . Frequency of Social Gatherings with Friends and Family: Not on file  . Attends Religious Services: Not on file  . Active Member of Clubs or Organizations: Not on file  . Attends Archivist Meetings: Not on file  . Marital Status: Not on file  Intimate Partner Violence:   . Fear of Current or Ex-Partner: Not on file  . Emotionally Abused: Not on file  . Physically Abused: Not on file  . Sexually Abused: Not on file   Family History  Problem Relation Age of Onset  . Alzheimer's disease Mother   . Heart attack Father   . Heart disease Father   . Irregular heart beat Brother        DEFIB.  . Congestive Heart Failure Brother   . Diabetes Daughter     Objective: Office vital signs reviewed. BP 102/79   Pulse (!) 107   Temp 97.6 F (36.4 C)   Ht 5\' 6"  (1.676 m)   Wt 219 lb 3.2 oz (99.4 kg)   SpO2 95%   BMI 35.38 kg/m   Physical Examination:  General: Awake, alert, well nourished, No acute distress HEENT: Normal, sclera white.  Moist mucous membranes Cardio: regular rate and rhythm, S1S2 heard, no murmurs appreciated Pulm: clear to auscultation bilaterally, no wheezes, rhonchi or rales; normal work of breathing on room air Skin: Flaky, greasy scales noted of the scalp.  No appreciable scaling of the eyebrows.  No  lesions elsewhere GU: no abdominal/suprapubic pain on palpation MSK: Ambulating independently Assessment/ Plan: 74 y.o. male   1. Hospital discharge follow-up I reviewed his discharge summary and recommendations  2. Recent urinary tract infection Check BMP, CBC - Basic Metabolic Panel - CBC with Differential  3. Recurrent UTI Continue to follow-up with urology as scheduled - Basic Metabolic Panel - CBC with Differential  4. ESBL (extended spectrum beta-lactamase) producing bacteria infection - Basic Metabolic Panel - CBC with Differential  5.  S/P radical cystoprostatectomy  6. S/p nephrectomy  7. Seborrheic dermatitis of scalp Clinically consistent with seborrheic dermatitis.  Nizoral prescribed.  He will follow-up as needed on this issue.  If persistent after 4 weeks could consider addition of topical clobetasol.  Does not appear psoriatic at this point, particularly in the absence of other psoriatic lesions elsewhere - ketoconazole (NIZORAL) 2 % shampoo; Lather and leave on scalp for 10-15 minutes then rinse off.  Use twice a week for the next 4 weeks.  Dispense: 120 mL; Refill: 0   No orders of the defined types were placed in this encounter.  No orders of the defined types were placed in this encounter.    Janora Norlander, DO [place] [phone]

## 2020-04-11 LAB — BASIC METABOLIC PANEL
BUN/Creatinine Ratio: 17 (ref 10–24)
BUN: 24 mg/dL (ref 8–27)
CO2: 23 mmol/L (ref 20–29)
Calcium: 10.3 mg/dL — ABNORMAL HIGH (ref 8.6–10.2)
Chloride: 99 mmol/L (ref 96–106)
Creatinine, Ser: 1.43 mg/dL — ABNORMAL HIGH (ref 0.76–1.27)
GFR calc Af Amer: 56 mL/min/{1.73_m2} — ABNORMAL LOW (ref >59)
GFR calc non Af Amer: 49 mL/min/{1.73_m2} — ABNORMAL LOW (ref >59)
Glucose: 143 mg/dL — ABNORMAL HIGH (ref 65–99)
Potassium: 5 mmol/L (ref 3.5–5.2)
Sodium: 138 mmol/L (ref 134–144)

## 2020-04-11 LAB — CBC WITH DIFFERENTIAL/PLATELET
Basophils Absolute: 0 10*3/uL (ref 0.0–0.2)
Basos: 0 %
EOS (ABSOLUTE): 0.2 10*3/uL (ref 0.0–0.4)
Eos: 3 %
Hematocrit: 43.4 % (ref 37.5–51.0)
Hemoglobin: 14.8 g/dL (ref 13.0–17.7)
Immature Grans (Abs): 0 10*3/uL (ref 0.0–0.1)
Immature Granulocytes: 0 %
Lymphocytes Absolute: 1.8 10*3/uL (ref 0.7–3.1)
Lymphs: 21 %
MCH: 30.3 pg (ref 26.6–33.0)
MCHC: 34.1 g/dL (ref 31.5–35.7)
MCV: 89 fL (ref 79–97)
Monocytes Absolute: 0.6 10*3/uL (ref 0.1–0.9)
Monocytes: 7 %
Neutrophils Absolute: 5.9 10*3/uL (ref 1.4–7.0)
Neutrophils: 69 %
Platelets: 207 10*3/uL (ref 150–450)
RBC: 4.89 x10E6/uL (ref 4.14–5.80)
RDW: 14.4 % (ref 11.6–15.4)
WBC: 8.6 10*3/uL (ref 3.4–10.8)

## 2020-04-24 ENCOUNTER — Telehealth: Payer: Self-pay

## 2020-04-24 NOTE — Telephone Encounter (Signed)
TC with patient then to the [facility] group, faxed paper that patient declines this at this time

## 2020-04-24 NOTE — Telephone Encounter (Signed)
Have you seen form ? ?

## 2020-05-01 ENCOUNTER — Ambulatory Visit (HOSPITAL_COMMUNITY)
Admission: RE | Admit: 2020-05-01 | Discharge: 2020-05-01 | Disposition: A | Discharge status: Home / Self Care | Payer: Medicare Other | Source: Ambulatory Visit | Attending: Urology | Admitting: Urology

## 2020-05-01 ENCOUNTER — Other Ambulatory Visit (HOSPITAL_COMMUNITY): Payer: Self-pay | Admitting: Urology

## 2020-05-01 ENCOUNTER — Other Ambulatory Visit: Payer: Self-pay

## 2020-05-01 DIAGNOSIS — I10 Essential (primary) hypertension: Secondary | ICD-10-CM | POA: Diagnosis not present

## 2020-05-01 DIAGNOSIS — C678 Malignant neoplasm of overlapping sites of bladder: Secondary | ICD-10-CM

## 2020-05-01 DIAGNOSIS — I700 Atherosclerosis of aorta: Secondary | ICD-10-CM | POA: Diagnosis not present

## 2020-05-01 DIAGNOSIS — K5730 Diverticulosis of large intestine without perforation or abscess without bleeding: Secondary | ICD-10-CM | POA: Diagnosis not present

## 2020-05-01 DIAGNOSIS — E278 Other specified disorders of adrenal gland: Secondary | ICD-10-CM | POA: Diagnosis not present

## 2020-05-01 DIAGNOSIS — Z8551 Personal history of malignant neoplasm of bladder: Secondary | ICD-10-CM | POA: Diagnosis not present

## 2020-05-01 DIAGNOSIS — I517 Cardiomegaly: Secondary | ICD-10-CM | POA: Diagnosis not present

## 2020-05-03 ENCOUNTER — Other Ambulatory Visit: Payer: Self-pay | Admitting: Family Medicine

## 2020-05-03 DIAGNOSIS — L219 Seborrheic dermatitis, unspecified: Secondary | ICD-10-CM

## 2020-05-09 DIAGNOSIS — Z936 Other artificial openings of urinary tract status: Secondary | ICD-10-CM | POA: Diagnosis not present

## 2020-05-09 DIAGNOSIS — C678 Malignant neoplasm of overlapping sites of bladder: Secondary | ICD-10-CM | POA: Diagnosis not present

## 2020-05-09 DIAGNOSIS — Z905 Acquired absence of kidney: Secondary | ICD-10-CM | POA: Diagnosis not present

## 2020-05-29 ENCOUNTER — Ambulatory Visit (INDEPENDENT_AMBULATORY_CARE_PROVIDER_SITE_OTHER): Payer: Medicare Other | Admitting: Family Medicine

## 2020-05-29 ENCOUNTER — Encounter: Payer: Self-pay | Admitting: Family Medicine

## 2020-05-29 DIAGNOSIS — J329 Chronic sinusitis, unspecified: Secondary | ICD-10-CM | POA: Diagnosis not present

## 2020-05-29 DIAGNOSIS — J40 Bronchitis, not specified as acute or chronic: Secondary | ICD-10-CM

## 2020-05-29 MED ORDER — AZITHROMYCIN 250 MG PO TABS: ORAL_TABLET | ORAL | 0 refills | Status: DC

## 2020-05-29 NOTE — Progress Notes (Signed)
Subjective:    Patient ID: Billy Rasmussen, male    DOB: Dec 01, 1945, 74 y.o.   MRN: [id number]   HPI: Billy Rasmussen is a 74 y.o. male presenting for cough with Upper chest congestion. Ropy sputum production. Feels just like previous Denies dyspnea, but chest is tight. Breathing good. Denies fever. Appetite is good. No NVD. Stays well hydrated since he only has one kidney.    Depression screen [facility] 2/9 04/10/2020 04/03/2020 01/24/2020 08/21/2019 05/22/2019  Decreased Interest 1 3 0 1 0  Down, Depressed, Hopeless 1 3 0 1 0  PHQ - 2 Score 2 6 0 2 0  Altered sleeping 0 2 0 0 -  Tired, decreased energy 1 2 2 1  -  Change in appetite 0 - 0 0 -  Feeling bad or failure about yourself  0 0 0 2 -  Trouble concentrating 0 0 1 0 -  Moving slowly or fidgety/restless 0 0 0 0 -  Suicidal thoughts 0 0 0 1 -  PHQ-9 Score 3 10 3 6  -  Difficult doing work/chores Not difficult at all - Not difficult at all Somewhat difficult -  Some recent data might be hidden     Relevant past medical, surgical, family and social history reviewed and updated as indicated.  Interim medical history since our last visit reviewed. Allergies and medications reviewed and updated.  ROS:  Review of Systems  Constitutional: Negative for activity change, appetite change, chills and fever.  HENT: Positive for congestion and rhinorrhea. Negative for ear pain, nosebleeds, postnasal drip, sinus pressure, sneezing and trouble swallowing.   Respiratory: Positive for chest tightness. Negative for shortness of breath.   Gastrointestinal: Negative for diarrhea, nausea and vomiting.  Skin: Negative for rash.  Neurological: Negative for headaches.     Social History   Tobacco Use  Smoking Status Former Smoker  . Packs/day: 2.00  . Years: 40.00  . Pack years: 80.00  . Types: Cigarettes  . Quit date: 06/01/1997  . Years since quitting: 23.0  Smokeless Tobacco Never Used       Objective:     Wt Readings from Last 3  Encounters:  04/10/20 219 lb 3.2 oz (99.4 kg)  04/03/20 219 lb (99.3 kg)  03/21/20 221 lb 1.9 oz (100.3 kg)     Exam deferred. Pt. Harboring due to COVID 19. Phone visit performed.   Assessment & Plan:  No diagnosis found.  Meds ordered this encounter  Medications  . azithromycin (ZITHROMAX Z-PAK) 250 MG tablet    Sig: Take two right away Then one a day for the next 4 days.    Dispense:  6 each    Refill:  0    No orders of the defined types were placed in this encounter.     There are no diagnoses linked to this encounter.  Virtual Visit via telephone Note  I discussed the limitations, risks, security and privacy concerns of performing an evaluation and management service by telephone and the availability of in person appointments. The patient was identified with two identifiers. Pt.expressed understanding and agreed to proceed. Pt. Is at home. Dr. Livia Snellen is in his office.  Follow Up Instructions:   I discussed the assessment and treatment plan with the patient. The patient was provided an opportunity to ask questions and all were answered. The patient agreed with the plan and demonstrated an understanding of the instructions.   The patient was advised to call back or seek an in-person  evaluation if the symptoms worsen or if the condition fails to improve as anticipated.   Total minutes including chart review and phone contact time: 16   Follow up plan: Return if symptoms worsen or fail to improve.  Mechele Claude, MD [facility]

## 2020-07-04 ENCOUNTER — Ambulatory Visit: Payer: Medicare Other | Admitting: Infectious Disease

## 2020-07-16 ENCOUNTER — Other Ambulatory Visit: Payer: Self-pay | Admitting: Family Medicine

## 2020-07-16 ENCOUNTER — Telehealth: Payer: Self-pay

## 2020-07-16 DIAGNOSIS — R238 Other skin changes: Secondary | ICD-10-CM

## 2020-07-16 MED ORDER — CLOBETASOL PROPIONATE 0.05 % EX SOLN: 1.0000 "application " | Freq: Two times a day (BID) | CUTANEOUS | 0 refills | Status: DC

## 2020-07-16 NOTE — Telephone Encounter (Signed)
Patient aware and verbalized understanding. °

## 2020-07-16 NOTE — Telephone Encounter (Signed)
Pharmacy comment:  Alternative Requested: DRUG NOT COVERED; PLEASE CALL INSURANCE OR SEND ALTERNATIVE.

## 2020-07-16 NOTE — Telephone Encounter (Signed)
Patient aware and verbalizes understanding. 

## 2020-07-16 NOTE — Telephone Encounter (Signed)
Pt called stating that he and Dr Lajuana Ripple talked about 2 different kinds of shampoos to help him with his issue, and says one of them was prescribed to him and seems to be helping but only a little. Wants to know if Dr Lajuana Ripple can send in Rx for other shampoo to see if that will help more.  Please advise and call patient.

## 2020-07-16 NOTE — Telephone Encounter (Signed)
Recommend Psychologist, prison [facility] probation services Tar shampoo since topical steroid not covered by insurance.

## 2020-07-16 NOTE — Telephone Encounter (Signed)
Clobetasol scalp soln sent. If no improvement after 2 weeks of use, plan for referral to derm.

## 2020-07-23 ENCOUNTER — Telehealth: Payer: Self-pay | Admitting: *Deleted

## 2020-07-23 NOTE — Telephone Encounter (Signed)
Fax from [place] Re: Clobetasol 0.05% solution Non-formulary drug Try betamethasone or another alternative

## 2020-07-23 NOTE — Telephone Encounter (Signed)
Addressed on 2/15 but just in case: Recommend purchasing [place] Tar shampoo since topical steroid not covered by insurance.

## 2020-07-23 NOTE — Telephone Encounter (Signed)
Pulled fax from [facility] and faxed this information back to them

## 2020-08-06 ENCOUNTER — Ambulatory Visit (INDEPENDENT_AMBULATORY_CARE_PROVIDER_SITE_OTHER): Payer: Medicare Other | Admitting: *Deleted

## 2020-08-06 DIAGNOSIS — Z0000 Encounter for general adult medical examination without abnormal findings: Secondary | ICD-10-CM | POA: Diagnosis not present

## 2020-08-06 NOTE — Patient Instructions (Signed)
Preventive Care 65 Years and Older, Male Preventive care refers to lifestyle choices and visits with your health care provider that can promote health and wellness. This includes:  A yearly physical exam. This is also called an annual wellness visit.  Regular dental and eye exams.  Immunizations.  Screening for certain conditions.  Healthy lifestyle choices, such as: ? Eating a healthy diet. ? Getting regular exercise. ? Not using drugs or products that contain nicotine and tobacco. ? Limiting alcohol use. What can I expect for my preventive care visit? Physical exam Your health care provider will check your:  Height and weight. These may be used to calculate your BMI (body mass index). BMI is a measurement that tells if you are at a healthy weight.  Heart rate and blood pressure.  Body temperature.  Skin for abnormal spots. Counseling Your health care provider may ask you questions about your:  Past medical problems.  Family's medical history.  Alcohol, tobacco, and drug use.  Emotional well-being.  Home life and relationship well-being.  Sexual activity.  Diet, exercise, and sleep habits.  History of falls.  Memory and ability to understand (cognition).  Work and work environment.  Access to firearms. What immunizations do I need? Vaccines are usually given at various ages, according to a schedule. Your health care provider will recommend vaccines for you based on your age, medical history, and lifestyle or other factors, such as travel or where you work.   What tests do I need? Blood tests  Lipid and cholesterol levels. These may be checked every 5 years, or more often depending on your overall health.  Hepatitis C test.  Hepatitis B test. Screening  Lung cancer screening. You may have this screening every year starting at age 55 if you have a 30-pack-year history of smoking and currently smoke or have quit within the past 15 years.  Colorectal  cancer screening. ? All adults should have this screening starting at age 50 and continuing until age 75. ? Your health care provider may recommend screening at age 45 if you are at increased risk. ? You will have tests every 1-10 years, depending on your results and the type of screening test.  Prostate cancer screening. Recommendations will vary depending on your family history and other risks.  Genital exam to check for testicular cancer or hernias.  Diabetes screening. ? This is done by checking your blood sugar (glucose) after you have not eaten for a while (fasting). ? You may have this done every 1-3 years.  Abdominal aortic aneurysm (AAA) screening. You may need this if you are a current or former smoker.  STD (sexually transmitted disease) testing, if you are at risk. Follow these instructions at home: Eating and drinking  Eat a diet that includes fresh fruits and vegetables, whole grains, lean protein, and low-fat dairy products. Limit your intake of foods with high amounts of sugar, saturated fats, and salt.  Take vitamin and mineral supplements as recommended by your health care provider.  Do not drink alcohol if your health care provider tells you not to drink.  If you drink alcohol: ? Limit how much you have to 0-2 drinks a day. ? Be aware of how much alcohol is in your drink. In the [place] one drink equals one 12 oz bottle of beer (355 mL), one 5 oz glass of wine (148 mL), or one 1 oz glass of hard liquor (44 mL).   Lifestyle  Take daily care of your teeth   and gums. Brush your teeth every morning and night with fluoride toothpaste. Floss one time each day.  Stay active. Exercise for at least 30 minutes 5 or more days each week.  Do not use any products that contain nicotine or tobacco, such as cigarettes, e-cigarettes, and chewing tobacco. If you need help quitting, ask your health care provider.  Do not use drugs.  If you are sexually active, practice safe sex.  Use a condom or other form of protection to prevent STIs (sexually transmitted infections).  Talk with your health care provider about taking a low-dose aspirin or statin.  Find healthy ways to cope with stress, such as: ? Meditation, yoga, or listening to music. ? Journaling. ? Talking to a trusted person. ? Spending time with friends and family. Safety  Always wear your seat belt while driving or riding in a vehicle.  Do not drive: ? If you have been drinking alcohol. Do not ride with someone who has been drinking. ? When you are tired or distracted. ? While texting.  Wear a helmet and other protective equipment during sports activities.  If you have firearms in your house, make sure you follow all gun safety procedures. What's next?  Visit your health care provider once a year for an annual wellness visit.  Ask your health care provider how often you should have your eyes and teeth checked.  Stay up to date on all vaccines. This information is not intended to replace advice given to you by your health care provider. Make sure you discuss any questions you have with your health care provider. Document Revised: 02/14/2019 Document Reviewed: 05/12/2018 Elsevier Patient Education  2021 [employer]  

## 2020-08-06 NOTE — Progress Notes (Signed)
MEDICARE ANNUAL WELLNESS VISIT  08/06/2020  Telephone Visit Disclaimer This Medicare AWV was conducted by telephone due to national recommendations for restrictions regarding the COVID-19 Pandemic (e.g. social distancing).  I verified, using two identifiers, that I am speaking with Billy Rasmussen or their authorized healthcare agent. I discussed the limitations, risks, security, and privacy concerns of performing an evaluation and management service by telephone and the potential availability of an in-person appointment in the future. The patient expressed understanding and agreed to proceed.  Location of Patient: home Location of Provider (nurse):  office  Subjective:    Billy Rasmussen is a 75 y.o. male patient of Billy Norlander, DO who had a Medicare Annual Wellness Visit today via telephone. Billy Rasmussen is Retired and lives with their spouse and mother in Sports [place]. he has 2 biological children and 3 stepchildren. he reports that he is socially active and does interact with friends/family regularly. he is minimally physically active and enjoys being an Clinical cytogeneticist, picking for antiques, folk art and carving.  Patient Care Team: Billy Norlander, DO as PCP - General (Family Medicine) Billy Sine, MD as PCP - Cardiology (Cardiology) Billy Aloe, MD as Consulting Physician (Urology) Billy Binder, MD (Inactive) as Consulting Physician (Gastroenterology)  Advanced Directives 08/06/2020 03/21/2020 12/28/2019 12/28/2019 07/14/2019 07/07/2019 06/25/2019  Does Patient Have a Medical Advance Directive? No No No No No - No  Type of Advance Directive - - - - - - -  Does patient want to make changes to medical advance directive? - - - - - - -  Copy of [place] in Chart? - - - - - - -  Would patient like information on creating a medical advance directive? No - Patient declined Yes (Inpatient - patient requests chaplain consult to create a medical advance directive) No -  Patient declined - No - Patient declined No - Patient declined No - Patient declined    Hospital Utilization Over the Past 12 Months: # of hospitalizations or ER visits: 0 # of surgeries: 0  Review of Systems    Patient reports that his overall health is worse compared to last year.  History obtained from chart review and the patient  Patient Reported Readings (BP, Pulse, CBG, Weight, etc) none  Pain Assessment Pain : 0-10 Pain Score: 7  Pain Type: Chronic pain Pain Location: Back Pain Descriptors / Indicators: Constant Pain Onset: More than a month ago Pain Frequency: Constant Effect of Pain on Daily Activities: severe     Current Medications & Allergies (verified) Allergies as of 08/06/2020   No Known Allergies     Medication List       Accurate as of August 06, 2020  9:40 AM. If you have any questions, ask your nurse or doctor.        STOP taking these medications   azithromycin 250 MG tablet Commonly known as: Zithromax Z-Pak   ciprofloxacin 500 MG tablet Commonly known as: CIPRO   clobetasol 0.05 % external solution Commonly known as: TEMOVATE   ferrous sulfate 325 (65 FE) MG tablet   ketoconazole 2 % shampoo Commonly known as: NIZORAL     TAKE these medications   docusate sodium 100 MG capsule Commonly known as: COLACE Take 200 mg by mouth daily.   lisinopril 5 MG tablet Commonly known as: ZESTRIL Take 1 tablet (5 mg total) by mouth daily.   meclizine 25 MG tablet Commonly known as: ANTIVERT meclizine 25 mg  tablet   Multivitamin Adult Tabs Take 1 tablet by mouth daily.   OVER THE COUNTER MEDICATION Take 1 tablet by mouth daily.   polyvinyl alcohol 1.4 % ophthalmic solution Commonly known as: LIQUIFILM TEARS Place 1 drop into both eyes as needed for dry eyes.       History (reviewed): Past Medical History:  Diagnosis Date   Arthritis    knees, shoulders, elbows   Bladder cancer [facility])    urologist-  dr eskridge    Diverticulosis of colon    Fibromyalgia    GERD (gastroesophageal reflux disease)    History of diverticulitis of colon    History of gastric ulcer    due to aleve   Hypertension    Lower urinary tract symptoms (LUTS)    OSA (obstructive sleep apnea)    NON-COMPLIANT  CPAP  --- BUT PT USES OXYGEN AT NIGHT 2.5L VIA [place] (PT'S DECISION)   PONV (postoperative nausea and vomiting)    Psoriasis    Sepsis ([place])    january 2021   Tinnitus    right ear more, has tranmitter in right ear removable at hs   Wears glasses    Wears partial dentures    Past Surgical History:  Procedure Laterality Date   APPENDECTOMY     COLECTOMY W/ COLOSTOMY  1996   W/   APPENDECTOMY   COLONOSCOPY N/A 05/11/2018   Procedure: COLONOSCOPY;  Surgeon: Daneil Dolin, MD;  [facility];  Service: Endoscopy;  Laterality: N/A;  9:30   COLOSTOMY TAKEDOWN  1996   CYSTOSCOPY WITH BIOPSY N/A 12/05/2013   Procedure: CYSTO BLADDER BIOPSY AND FULGERATION;  Surgeon: Billy Aloe, MD;  Location: [facility];  Service: Urology;  Laterality: N/A;   CYSTOSCOPY WITH BIOPSY Bilateral 11/13/2014   Procedure: CYSTOSCOPY WITH  BLADDER BIOPSY FULGERATION AND BILATERAL RETROGRADE PYELOGRAMS;  Surgeon: Billy Aloe, MD;  [facility];  Service: Urology;  Laterality: Bilateral;   CYSTOSCOPY WITH FULGERATION N/A 01/18/2018   Procedure: CYSTOSCOPY WITH FULGERATION/ BLADDER BIOPSY;  Surgeon: Billy Aloe, MD;  [facility];  Service: Urology;  Laterality: N/A;   CYSTOSCOPY WITH INJECTION N/A 11/09/2018   Procedure: CYSTOSCOPY WITH INJECTION OF INDOCYANINE GREEN DYE;  Surgeon: Alexis Frock, MD;  [facility];  Service: Urology;  Laterality: N/A;   CYSTOSCOPY WITH INSERTION OF UROLIFT N/A 01/18/2018   Procedure: CYSTOSCOPY WITH INSERTION OF UROLIFT;  Surgeon: Billy Aloe, MD;  Location: [facility];  Service:  Urology;  Laterality: N/A;   CYSTOSCOPY/URETEROSCOPY/HOLMIUM LASER Left 02/17/2019   Procedure: URETEROSCOPY WITH BALLOON DILATION  AND NEPHROSTOGRAM;  Surgeon: Alexis Frock, MD;  Location: [facility];  Service: Urology;  Laterality: Left;  1 HR   EXCISION RIGHT UPPER ARM LIPOMA  2005   HEMORROIDECTOMY     INGUINAL HERNIA REPAIR Left 1984   IR NEPHROSTOMY PLACEMENT LEFT  01/05/2019   KNEE ARTHROSCOPY Left X3  LAST ONE  2002   ORIF LEFT HUMEROUS FX  1976   POLYPECTOMY  05/11/2018   Procedure: POLYPECTOMY;  Surgeon: Daneil Dolin, MD;  [facility];  Service: Endoscopy;;   PROSTATE SURGERY     ROBOT ASSISTED LAPAROSCOPIC NEPHRECTOMY Left 07/14/2019   Procedure: XI ROBOTIC ASSISTED LAPAROSCOPIC RETROPERITONEAL NEPHRECTOMY;  Surgeon: Alexis Frock, MD;  [facility];  Service: Urology;  Laterality: Left;  3 HRS   TONSILLECTOMY  AS CHILD   TOTAL KNEE ARTHROPLASTY Left 2006   REVISION  2007  (AFTER I & D WITH ANTIBIOTIC SPACER PROCEDURE FOR STEPH INFECTION)   TOTAL KNEE ARTHROPLASTY Right 03/30/2016   Procedure: RIGHT TOTAL KNEE ARTHROPLASTY;  Surgeon: Paralee Cancel, MD;  [facility];  Service: Orthopedics;  Laterality: Right;   TOTAL SHOULDER ARTHROPLASTY Right 04/06/2013   Procedure: RIGHT TOTAL SHOULDER ARTHROPLASTY;  Surgeon: Marin Shutter, MD;  Location: [place];  Service: Orthopedics;  Laterality: Right;   TOTAL SHOULDER ARTHROPLASTY Left 07/20/2013   Procedure: LEFT TOTAL SHOULDER ARTHROPLASTY;  Surgeon: Marin Shutter, MD;  Location: [place];  Service: Orthopedics;  Laterality: Left;   TRANSURETHRAL RESECTION OF BLADDER TUMOR N/A 11/12/2015   Procedure: TRANSURETHRAL RESECTION OF BLADDER TUMOR (TURBT);  Surgeon: Billy Aloe, MD;  [facility]: [facility];  Service: Urology;  Laterality: N/A;   TRANSURETHRAL RESECTION OF BLADDER TUMOR WITH GYRUS (TURBT-GYRUS) N/A 12/05/2013   Procedure: TRANSURETHRAL RESECTION OF BLADDER TUMOR  WITH GYRUS (TURBT-GYRUS);  Surgeon: Billy Aloe, MD;  Location: [facility];  Service: Urology;  Laterality: N/A;   Family History  Problem Relation Age of Onset   Alzheimer's disease Mother    Heart attack Father    Heart disease Father    Irregular heart beat Brother        DEFIB.   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   Tobacco Use   Smoking status: Former Smoker    Packs/day: 2.00    Years: 40.00    Pack years: 80.00    Types: Cigarettes    Quit date: 06/01/1997    Years since quitting: 23.1   Smokeless tobacco: Never Used  Scientific laboratory technician Use: Never used  Substance and Sexual Activity   Alcohol use: No   Drug use: No   Sexual activity: Not Currently  Other Topics Concern   Not on file  Social History Narrative   Not on file   Social Determinants of Health   Financial Resource Strain: Low Risk    Difficulty of Paying Living Expenses: Not hard at all  Food Insecurity: No Food Insecurity   Worried About Charity fundraiser in the Last Year: Never true   [place] in the Last Year: Never true  Transportation Needs: No Transportation Needs   Lack of Transportation (Medical): No   Lack of Transportation (Non-Medical): No  Physical Activity: Inactive   Days of Exercise per Week: 0 days   Minutes of Exercise per Session: 0 min  Stress: No Stress Concern Present   Feeling of Stress : Not at all  Social Connections: Socially Integrated   Frequency of Communication with Friends and Family: More than three times a week   Frequency of Social Gatherings with Friends and Family: More  than three times a week   Attends Religious Services: More than 4 times per year   Active Member of [employer] or Organizations: Yes   Attends Archivist Meetings: More than 4 times per year   Marital Status: Married    Activities of Daily Living In your present state of health, do you have any difficulty performing the following activities: 08/06/2020 03/21/2020  Hearing? Tempie Donning  Comment wears bilateral hearing aids -  Vision? N N  Comment wears rx glasses all the time -  Difficulty concentrating or making decisions? N N  Walking or climbing stairs? Y Y  Comment due to his back and bilateral knee pain -  Dressing or bathing? N N  Doing errands, shopping? N N  Preparing Food and eating ? N -  Using the Toilet? N -  In the past six months, have you accidently leaked urine? N -  Comment pt has a urostomy -  Do you have problems with loss of bowel control? N -  Managing your Medications? N -  Managing your Finances? N -  Housekeeping or managing your Housekeeping? N -  Some recent data might be hidden    Patient Education/ Literacy How often do you need to have someone help you when you read instructions, pamphlets, or other written materials from your doctor or pharmacy?: 1 - Never What is the last grade level you completed in school?: Associates Degree  Exercise Current Exercise Habits: The patient does not participate in regular exercise at present, Exercise limited by: orthopedic condition(s)  Diet Patient reports consuming 3 meals a day and 1 snack(s) a day Patient reports that his primary diet is: Regular Patient reports that she does have regular access to food.   Depression Screen PHQ 2/9 Scores 08/06/2020 04/10/2020 04/03/2020 01/24/2020 08/21/2019 05/22/2019 12/30/2018  PHQ - 2 Score 0 2 6 0 2 0 0  PHQ- 9 Score - 3 10 3 6  - -     Fall Risk Fall Risk  08/06/2020 04/10/2020 01/24/2020 05/22/2019 12/30/2018  Falls in the past year? 0 0 0 0 0  Number falls in past yr: -  - - - -  Injury with Fall? - - - - -  Risk for fall due to : - - - - -  Follow up - - - Falls prevention discussed -     Objective:  Billy Rasmussen seemed alert and oriented and he participated appropriately during our telephone visit.  Blood Pressure Weight BMI  BP Readings from Last 3 Encounters:  04/10/20 102/79  04/03/20 122/89  03/26/20 131/79   Wt Readings from Last 3 Encounters:  04/10/20 219 lb 3.2 oz (99.4 kg)  04/03/20 219 lb (99.3 kg)  03/21/20 221 lb 1.9 oz (100.3 kg)   BMI Readings from Last 1 Encounters:  04/10/20 35.38 kg/m    *Unable to obtain current vital signs, weight, and BMI due to telephone visit type  Hearing/Vision   Fidencio did not seem to have difficulty with hearing/understanding during the telephone conversation  Reports that he has had a formal eye exam by an eye care professional within the past year  Reports that he has not had a formal hearing evaluation within the past year *Unable to fully assess hearing and vision during telephone visit type  Cognitive Function: 6CIT Screen 08/06/2020 12/29/2018  What Year? 0 points 0 points  What month? 0 points 0 points  What time? 0 points 0 points  Count back from 20 0 points 0 points  Months in reverse 0 points 0 points  Repeat phrase 0 points 0 points  Total Score 0 0   (Normal:0-7, Significant for Dysfunction: >8)  Normal Cognitive Function Screening: Yes   Immunization & Health Maintenance Record Immunization History  Administered Date(s) Administered   Fluad Quad(high Dose 65+) 02/08/2019, 04/10/2020   Influenza, High Dose Seasonal PF 04/15/2017  Influenza,inj,Quad PF,6+ Mos 02/25/2016, 03/01/2018   Moderna Sars-Covid-2 Vaccination 11/14/2019, 12/12/2019   Pneumococcal Conjugate-13 08/21/2019   Pneumococcal Polysaccharide-23 12/27/2017    Health Maintenance  Topic Date Due   COVID-19 Vaccine (3 - Moderna risk 4-dose series) 01/09/2020   TETANUS/TDAP  08/20/2020  (Originally 11/02/1964)   COLONOSCOPY (Pts 45-18yrs Insurance coverage will need to be confirmed)  05/11/2028   INFLUENZA VACCINE  Completed   Hepatitis C Screening  Completed   PNA vac Low Risk Adult  Completed   HPV VACCINES  Aged Out       Assessment  This is a routine wellness examination for Billy Rasmussen.  Health Maintenance: Due or Overdue Health Maintenance Due  Topic Date Due   COVID-19 Vaccine (3 - Moderna risk 4-dose series) 01/09/2020    Billy Rasmussen does not need a referral for Community Assistance: Care Management:   no Social Work:    no Prescription Assistance:  no Nutrition/Diabetes Education:  no   Plan:  Personalized Goals Goals Addressed              This Visit's Progress     Patient Stated (pt-stated)        I would like to continue with my "folk art" and continue carving       Personalized Health Maintenance & Screening Recommendations  He is up to date on all health screening recommendations  Lung Cancer Screening Recommended: no (Low Dose CT Chest recommended if Age 53-80 years, 30 pack-year currently smoking OR have quit w/in past 15 years) Hepatitis C Screening recommended: no HIV Screening recommended: no  Advanced Directives: Written information was not prepared per patient's request.  Referrals & Orders No orders of the defined types were placed in this encounter.   Follow-up Plan  Follow-up with Billy Norlander, DO as planned    I have personally reviewed and noted the following in the patients chart:    Medical and social history  Use of alcohol, tobacco or illicit drugs   Current medications and supplements  Functional ability and status  Nutritional status  Physical activity  Advanced directives  List of other physicians  Hospitalizations, surgeries, and ER visits in previous 12 months  Vitals  Screenings to include cognitive, depression, and falls  Referrals and appointments  In  addition, I have reviewed and discussed with Billy Rasmussen certain preventive protocols, quality metrics, and best practice recommendations. A written personalized care plan for preventive services as well as general preventive health recommendations is available and can be mailed to the patient at his request.      Milas Hock, LPN  2/0/1007

## 2020-08-15 DIAGNOSIS — W5911XA Bitten by nonvenomous snake, initial encounter: Secondary | ICD-10-CM | POA: Diagnosis not present

## 2020-08-15 DIAGNOSIS — S60512A Abrasion of left hand, initial encounter: Secondary | ICD-10-CM | POA: Diagnosis not present

## 2020-08-15 DIAGNOSIS — Z23 Encounter for immunization: Secondary | ICD-10-CM | POA: Diagnosis not present

## 2020-08-15 DIAGNOSIS — S61452A Open bite of left hand, initial encounter: Secondary | ICD-10-CM | POA: Diagnosis not present

## 2020-10-11 ENCOUNTER — Telehealth: Payer: Self-pay

## 2020-10-11 NOTE — Telephone Encounter (Signed)
Pts wife called to schedule pt an appt to see Dr Lajuana Ripple for dementia evaluation. Explained to wife that Dr Lajuana Ripple is booking out until July. Wife needs pt to be seen sooner.  Please advise and call wife Vickii Chafe) [phone]

## 2020-10-21 ENCOUNTER — Other Ambulatory Visit: Payer: Self-pay

## 2020-10-21 ENCOUNTER — Ambulatory Visit (INDEPENDENT_AMBULATORY_CARE_PROVIDER_SITE_OTHER): Payer: Medicare Other | Admitting: Family Medicine

## 2020-10-21 ENCOUNTER — Encounter: Payer: Self-pay | Admitting: Family Medicine

## 2020-10-21 VITALS — BP 140/87 | HR 73 | Temp 98.2°F | Ht 66.0 in | Wt 228.2 lb

## 2020-10-21 DIAGNOSIS — R41840 Attention and concentration deficit: Secondary | ICD-10-CM | POA: Diagnosis not present

## 2020-10-21 DIAGNOSIS — R413 Other amnesia: Secondary | ICD-10-CM | POA: Diagnosis not present

## 2020-10-21 DIAGNOSIS — F320 Major depressive disorder, single episode, mild: Secondary | ICD-10-CM

## 2020-10-21 DIAGNOSIS — N1831 Chronic kidney disease, stage 3a: Secondary | ICD-10-CM

## 2020-10-21 MED ORDER — BUPROPION HCL ER (XL) 150 MG PO TB24: 150.0000 mg | ORAL_TABLET | Freq: Every day | ORAL | 0 refills | Status: DC

## 2020-10-21 NOTE — Progress Notes (Signed)
Subjective: CC: Memory difficulty PCP: Janora Norlander, DO TOI:[id number] W Broker is a 75 y.o. male presenting to clinic today for:  1.  Memory difficulty Patient reports he is finding it harder and harder to remember things.  He often needs to maintain a calendar and/or check list in efforts to stay on task.  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  He does report dementia runs in the family and he worries about this.  He is got some depressive symptoms.  Not currently treated for depression.  Willing to start.  2.  Renal disease Patient with solitary kidney.  He is on lisinopril 5 mg daily for further renal protection.  Wants to know if he can use factor, which is a non-NSAID pain reliever.   ROS: Per HPI  No Known Allergies Past Medical History:  Diagnosis Date  . Arthritis    knees, shoulders, elbows  . Bladder cancer [facility])    urologist-  dr Junious Silk  . Diverticulosis of colon   . Fibromyalgia   . GERD (gastroesophageal reflux disease)   . History of diverticulitis of colon   . History of gastric ulcer    due to aleve  . Hypertension   . Lower urinary tract symptoms (LUTS)   . OSA (obstructive sleep apnea)    NON-COMPLIANT  CPAP  --- BUT PT USES OXYGEN AT NIGHT 2.5L VIA [place] (PT'S DECISION)  . PONV (postoperative nausea and vomiting)   . Psoriasis   . Sepsis [facility])    january 2021  . Tinnitus    right ear more, has tranmitter in right ear removable at hs  . Wears glasses   . Wears partial dentures     Current Outpatient Medications:  .  docusate sodium (COLACE) 100 MG capsule, Take 200 mg by mouth daily. , Disp: , Rfl:  .  lisinopril (ZESTRIL) 5 MG tablet, Take 1 tablet (5 mg total) by mouth daily., Disp: 90 tablet, Rfl: 3 .  meclizine (ANTIVERT) 25 MG tablet, meclizine 25 mg tablet, Disp: , Rfl:  .  Multiple Vitamin (MULTIVITAMIN ADULT) TABS, Take 1 tablet by mouth daily., Disp: , Rfl:   .  OVER THE COUNTER MEDICATION, Take 1 tablet by mouth daily. , Disp: , Rfl:  .  polyvinyl alcohol (LIQUIFILM TEARS) 1.4 % ophthalmic solution, Place 1 drop into both eyes as needed for dry eyes., Disp: 15 mL, Rfl: 0 Social History   Socioeconomic History  . Marital status: Married    Spouse name: Vickii Chafe  . Number of children: 2  . Years of education: 15  . Highest education level: Associate degree: occupational, Hotel manager, or vocational program  Occupational History  . 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 Smoking status: Former Smoker    Packs/day: 2.00    Years: 40.00    Pack years: 80.00    Types: Cigarettes    Quit date: 06/01/1997    Years since quitting: 23.4  . Smokeless tobacco: Never Used  Vaping Use  . Vaping Use: Never used  Substance and Sexual Activity  . Alcohol use: No  . Drug use: No  . Sexual activity: Not Currently  Other Topics Concern  . Not on file  Social History Narrative  . Not on file   Social Determinants of Health   Financial Resource Strain: Low Risk   .  Difficulty of Paying Living Expenses: Not hard at all  Food Insecurity: No Food Insecurity  . Worried About Charity fundraiser in the Last Year: Never true  . Ran Out of Food in the Last Year: Never true  Transportation Needs: No Transportation Needs  . Lack of Transportation (Medical): No  . Lack of Transportation (Non-Medical): No  Physical Activity: Inactive  . Days of Exercise per Week: 0 days  . Minutes of Exercise per Session: 0 min  Stress: No Stress Concern Present  . Feeling of Stress : Not at all  Social Connections: Socially Integrated  . Frequency of Communication with Friends and Family: More than three times a week  . Frequency of Social Gatherings with Friends and Family: More than three times a week  . Attends Religious Services: More than 4 times per year  . Active Member of Clubs or Organizations: Yes  . Attends Theatre manager Meetings: More than 4 times per year  . Marital Status: Married  Human resources officer Violence: Not At Risk  . Fear of Current or Ex-Partner: No  . Emotionally Abused: No  . Physically Abused: No  . Sexually Abused: No   Family History  Problem Relation Age of Onset  . Alzheimer's disease Mother   . Heart attack Father   . Heart disease Father   . Irregular heart beat Brother        DEFIB.  . Congestive Heart Failure Brother   . Diabetes Daughter     Objective: Office vital signs reviewed. BP 140/87   Pulse 73   Temp 98.2 F (36.8 C)   Ht 5\' 6"  (1.676 m)   Wt 228 lb 3.2 oz (103.5 kg)   SpO2 97%   BMI 36.83 kg/m   Physical Examination:  General: Awake, alert, well nourished, No acute distress Cardio: regular rate and rhythm, S1S2 heard, no murmurs appreciated Pulm: clear to auscultation bilaterally, no wheezes, rhonchi or rales; normal work of breathing on room air Neuro: No focal neurologic deficits.  Somewhat hard of hearing however.  MMSE - Mini Mental State Exam 10/21/2020 12/27/2017  Orientation to time 5 5  Orientation to Place 5 5  Registration 3 3  Attention/ Calculation 5 5  Recall 3 3  Language- name 2 objects 2 2  Language- repeat 1 1  Language- follow 3 step command 3 3  Language- read & follow direction 1 1  Write a sentence 1 1  Copy design 1 1  Total score 30 30   Depression screen [facility] 2/9 10/21/2020 08/06/2020 04/10/2020  Decreased Interest 2 0 1  Down, Depressed, Hopeless 2 0 1  PHQ - 2 Score 4 0 2  Altered sleeping 1 - 0  Tired, decreased energy 3 - 1  Change in appetite 1 - 0  Feeling bad or failure about yourself  0 - 0  Trouble concentrating 0 - 0  Moving slowly or fidgety/restless 0 - 0  Suicidal thoughts 0 - 0  PHQ-9 Score 9 - 3  Difficult doing work/chores Somewhat difficult - Not difficult at all  Some recent data might be hidden   GAD 7 : Generalized Anxiety Score 10/21/2020 10/21/2020 04/10/2020  Nervous, Anxious, on Edge  0 0 0  Control/stop worrying 0 0 0  Worry too much - different things 0 0 0  Trouble relaxing 0 0 0  Restless 0 0 0  Easily annoyed or irritable 0 0 0  Afraid - awful might happen 0 0  0  Total GAD 7 Score 0 0 0  Anxiety Difficulty Not difficult at all - Not difficult at all    Assessment/ Plan: 75 y.o. male   Depression, major, single episode, mild (HCC) - Plan: buPROPion (WELLBUTRIN XL) 150 MG 24 hr tablet  Concentration deficit - Plan: buPROPion (WELLBUTRIN XL) 150 MG 24 hr tablet  Memory change - Plan: Vitamin B12  Stage 3a chronic kidney disease ([place]) - Plan: Renal Function Panel  Will start Wellbutrin.  I wonder if this will improve the concentration deficit he is identifying.  His memory testing was totally normal and there is no evidence of dementia.  We will check B12 just in case.  Has known renal dysfunction, stage IIIa.  He had questions about factor pain reliever and from what I can tell I cannot see anything that might be nephrotoxic in it.  It seems to be mostly herbal remedies.  We will run this by our clinical pharmacist to double check if I suspect he should be able to proceed with this medicine if needed for aches and pains.  No orders of the defined types were placed in this encounter.  No orders of the defined types were placed in this encounter.    Janora Norlander, DO [place] [phone]

## 2020-10-21 NOTE — Patient Instructions (Signed)
Memory test was perfect. NO evidence of dementia  You do test positive for depression and concentration problems.  Trial of Wellbutrin.  See me in 6 weeks for recheck, sooner if concerns arise  You had labs performed today.  You will be contacted with the results of the labs once they are available, usually in the next 3 business days for routine lab work.  If you have an active my chart account, they will be released to your MyChart.  If you prefer to have these labs released to you via telephone, please let us know.  If you had a pap smear or biopsy performed, expect to be contacted in about 7-10 days.

## 2020-10-22 ENCOUNTER — Ambulatory Visit (HOSPITAL_COMMUNITY)
Admission: RE | Admit: 2020-10-22 | Discharge: 2020-10-22 | Disposition: A | Discharge status: Home / Self Care | Payer: Medicare Other | Source: Ambulatory Visit | Attending: Urology | Admitting: Urology

## 2020-10-22 ENCOUNTER — Other Ambulatory Visit (HOSPITAL_COMMUNITY): Payer: Self-pay | Admitting: Urology

## 2020-10-22 ENCOUNTER — Encounter: Payer: Self-pay | Admitting: Oncology

## 2020-10-22 DIAGNOSIS — K5750 Diverticulosis of both small and large intestine without perforation or abscess without bleeding: Secondary | ICD-10-CM | POA: Diagnosis not present

## 2020-10-22 DIAGNOSIS — C678 Malignant neoplasm of overlapping sites of bladder: Secondary | ICD-10-CM | POA: Diagnosis not present

## 2020-10-22 DIAGNOSIS — Z9889 Other specified postprocedural states: Secondary | ICD-10-CM | POA: Diagnosis not present

## 2020-10-22 DIAGNOSIS — Z8551 Personal history of malignant neoplasm of bladder: Secondary | ICD-10-CM | POA: Diagnosis not present

## 2020-10-22 DIAGNOSIS — K8020 Calculus of gallbladder without cholecystitis without obstruction: Secondary | ICD-10-CM | POA: Diagnosis not present

## 2020-10-22 DIAGNOSIS — I517 Cardiomegaly: Secondary | ICD-10-CM | POA: Diagnosis not present

## 2020-10-22 LAB — RENAL FUNCTION PANEL
Albumin: 4.9 g/dL — ABNORMAL HIGH (ref 3.7–4.7)
BUN/Creatinine Ratio: 13 (ref 10–24)
BUN: 18 mg/dL (ref 8–27)
CO2: 24 mmol/L (ref 20–29)
Calcium: 9.9 mg/dL (ref 8.6–10.2)
Chloride: 99 mmol/L (ref 96–106)
Creatinine, Ser: 1.4 mg/dL — ABNORMAL HIGH (ref 0.76–1.27)
Glucose: 87 mg/dL (ref 65–99)
Phosphorus: 2.8 mg/dL (ref 2.8–4.1)
Potassium: 4.6 mmol/L (ref 3.5–5.2)
Sodium: 140 mmol/L (ref 134–144)
eGFR: 53 mL/min/{1.73_m2} — ABNORMAL LOW (ref >59)

## 2020-10-22 LAB — VITAMIN B12: Vitamin B-12: 474 pg/mL (ref 232–1245)

## 2020-11-04 DIAGNOSIS — Z936 Other artificial openings of urinary tract status: Secondary | ICD-10-CM | POA: Diagnosis not present

## 2020-11-04 DIAGNOSIS — C678 Malignant neoplasm of overlapping sites of bladder: Secondary | ICD-10-CM | POA: Diagnosis not present

## 2020-11-04 DIAGNOSIS — Z905 Acquired absence of kidney: Secondary | ICD-10-CM | POA: Diagnosis not present

## 2020-11-13 ENCOUNTER — Ambulatory Visit (INDEPENDENT_AMBULATORY_CARE_PROVIDER_SITE_OTHER): Payer: Medicare Other | Admitting: Family Medicine

## 2020-11-13 ENCOUNTER — Encounter: Payer: Self-pay | Admitting: Family Medicine

## 2020-11-13 DIAGNOSIS — Z20822 Contact with and (suspected) exposure to covid-19: Secondary | ICD-10-CM

## 2020-11-13 NOTE — Addendum Note (Signed)
Addended by: Reather Converse on: 11/13/2020 04:22 PM   Modules accepted: Orders

## 2020-11-13 NOTE — Progress Notes (Signed)
Virtual Visit via telephone Note  I connected with Billy Rasmussen on 11/13/20 at 1508 by telephone and verified that I am speaking with the correct person using two identifiers. Billy Rasmussen is currently located at home and patient are currently with her during visit. The provider, Fransisca Kaufmann Havilah Topor, MD is located in their office at time of visit.  Call ended at 1514  I discussed the limitations, risks, security and privacy concerns of performing an evaluation and management service by telephone and the availability of in person appointments. I also discussed with the patient that there may be a patient responsible charge related to this service. The patient expressed understanding and agreed to proceed.   History and Present Illness: Patient is calling in for 1 week brother passed away and best friend passed.  His wife has covid starting 5 days ago. He is not having cough or congestion or any other symptoms outside of his normal.  He is vaccinated.  He also had boosted.  He has a history of cancer.  He is a former smoker.  No diagnosis found.  Outpatient Encounter Medications as of 11/13/2020  Medication Sig   buPROPion (WELLBUTRIN XL) 150 MG 24 hr tablet Take 1 tablet (150 mg total) by mouth daily.   docusate sodium (COLACE) 100 MG capsule Take 200 mg by mouth daily.    lisinopril (ZESTRIL) 5 MG tablet Take 1 tablet (5 mg total) by mouth daily.   meclizine (ANTIVERT) 25 MG tablet meclizine 25 mg tablet   Multiple Vitamin (MULTIVITAMIN ADULT) TABS Take 1 tablet by mouth daily.   OVER THE COUNTER MEDICATION Take 1 tablet by mouth daily.    polyvinyl alcohol (LIQUIFILM TEARS) 1.4 % ophthalmic solution Place 1 drop into both eyes as needed for dry eyes.   No facility-administered encounter medications on file as of 11/13/2020.    Review of Systems  Constitutional:  Negative for chills and fever.  HENT:  Negative for congestion, ear discharge, ear pain, postnasal drip, rhinorrhea,  sinus pressure, sneezing, sore throat and voice change.   Eyes:  Negative for pain, discharge, redness and visual disturbance.  Respiratory:  Negative for cough, shortness of breath and wheezing.   Cardiovascular:  Negative for chest pain and leg swelling.  Musculoskeletal:  Negative for gait problem.  Skin:  Negative for rash.  All other systems reviewed and are negative.  Observations/Objective: Patient sounds comfortable and in no acute distress  Assessment and Plan: Problem List Items Addressed This Visit   None Visit Diagnoses     Close exposure to COVID-19 virus    -  Primary   Relevant Orders   Novel Coronavirus, NAA ([facility])        Follow up plan: Return if symptoms worsen or fail to improve.     I discussed the assessment and treatment plan with the patient. The patient was provided an opportunity to ask questions and all were answered. The patient agreed with the plan and demonstrated an understanding of the instructions.   The patient was advised to call back or seek an in-person evaluation if the symptoms worsen or if the condition fails to improve as anticipated.  The above assessment and management plan was discussed with the patient. The patient verbalized understanding of and has agreed to the management plan. Patient is aware to call the clinic if symptoms persist or worsen. Patient is aware when to return to the clinic for a follow-up visit. Patient educated on when it is  appropriate to go to the emergency department.    I provided 6 minutes of non-face-to-face time during this encounter.    Worthy Rancher, MD

## 2020-11-14 LAB — SARS-COV-2, NAA 2 DAY TAT

## 2020-11-14 LAB — NOVEL CORONAVIRUS, NAA: SARS-CoV-2, NAA: NOT DETECTED

## 2020-11-25 ENCOUNTER — Ambulatory Visit (INDEPENDENT_AMBULATORY_CARE_PROVIDER_SITE_OTHER): Payer: Medicare Other | Admitting: Family Medicine

## 2020-11-25 ENCOUNTER — Other Ambulatory Visit: Payer: Self-pay

## 2020-11-25 DIAGNOSIS — J029 Acute pharyngitis, unspecified: Secondary | ICD-10-CM

## 2020-11-25 DIAGNOSIS — Z20822 Contact with and (suspected) exposure to covid-19: Secondary | ICD-10-CM

## 2020-11-25 MED ORDER — BENZONATATE 100 MG PO CAPS: 100.0000 mg | ORAL_CAPSULE | Freq: Three times a day (TID) | ORAL | 0 refills | Status: DC | PRN

## 2020-11-25 MED ORDER — MAGIC MOUTHWASH W/LIDOCAINE: ORAL | 0 refills | Status: DC

## 2020-11-25 NOTE — Progress Notes (Signed)
Telephone visit  Subjective: CC: COVID exposure PCP: Janora Norlander, DO Billy Rasmussen is a 75 y.o. male calls for telephone consult today. Patient provides verbal consent for consult held via phone.  Due to COVID-19 pandemic this visit was conducted virtually. This visit type was conducted due to national recommendations for restrictions regarding the COVID-19 Pandemic (e.g. social distancing, sheltering in place) in an effort to limit this patient's exposure and mitigate transmission in our community. All issues noted in this document were discussed and addressed.  A physical exam was not performed with this format.   Location of patient: home Location of provider: [facility] Others present for call: none  1. Bronchitis? Patient reports his brother passed away 11/15/22 and he went down to [employer].  His wife tested positive for COVID about 2 weeks ago then the caretaker tested positive last week.  He reports he has heaviness in chest, productive cough (purulence? Yellow w/ foul taste), sore throat, fatigue.  No diarrhea, vomiting.  No aching more than normal.  No OTCs used.  Symptoms onset about 2 days ago.  No shortness of breath reported.    ROS: Per HPI  No Known Allergies Past Medical History:  Diagnosis Date   Arthritis    knees, shoulders, elbows   Bladder cancer [facility])    urologist-  dr eskridge   Diverticulosis of colon    Fibromyalgia    GERD (gastroesophageal reflux disease)    History of diverticulitis of colon    History of gastric ulcer    due to aleve   Hypertension    Lower urinary tract symptoms (LUTS)    OSA (obstructive sleep apnea)    NON-COMPLIANT  CPAP  --- BUT PT USES OXYGEN AT NIGHT 2.5L VIA [place] (PT'S DECISION)   PONV (postoperative nausea and vomiting)    Psoriasis    Sepsis ([place])    january 2021   Tinnitus    right ear more, has tranmitter in right ear removable at hs   Wears glasses    Wears partial dentures     Current Outpatient Medications:     buPROPion (WELLBUTRIN XL) 150 MG 24 hr tablet, Take 1 tablet (150 mg total) by mouth daily., Disp: 90 tablet, Rfl: 0   docusate sodium (COLACE) 100 MG capsule, Take 200 mg by mouth daily. , Disp: , Rfl:    lisinopril (ZESTRIL) 5 MG tablet, Take 1 tablet (5 mg total) by mouth daily., Disp: 90 tablet, Rfl: 3   meclizine (ANTIVERT) 25 MG tablet, meclizine 25 mg tablet, Disp: , Rfl:    Multiple Vitamin (MULTIVITAMIN ADULT) TABS, Take 1 tablet by mouth daily., Disp: , Rfl:    OVER THE COUNTER MEDICATION, Take 1 tablet by mouth daily. , Disp: , Rfl:    polyvinyl alcohol (LIQUIFILM TEARS) 1.4 % ophthalmic solution, Place 1 drop into both eyes as needed for dry eyes., Disp: 15 mL, Rfl: 0  Assessment/ Plan: 75 y.o. male   Contact with and (suspected) exposure to covid-19 - Plan: Novel Coronavirus, NAA ([facility]), magic mouthwash w/lidocaine SOLN, benzonatate (TESSALON PERLES) 100 MG capsule  Sore throat - Plan: magic mouthwash w/lidocaine SOLN  Suspicious for COVID-19 given close exposure.  He will come in for COVID-19 test.  Thus far testing negative by home test.  Ladona Ridgel and Magic mouthwash have been ordered in the interim.  We will hold off on any antivirals until we get a positive COVID test. He understands red flag signs and symptoms warranting further evaluation.  Follow-up as needed  Start time: 1:28pm End time: 1:39pm  Total time spent on patient care (including telephone call/ virtual visit): 11 minutes  [place], [place] [phone]

## 2020-11-26 ENCOUNTER — Other Ambulatory Visit: Payer: Self-pay | Admitting: Family Medicine

## 2020-11-26 ENCOUNTER — Telehealth: Payer: Self-pay | Admitting: Family Medicine

## 2020-11-26 ENCOUNTER — Other Ambulatory Visit: Payer: Self-pay

## 2020-11-26 DIAGNOSIS — Z20822 Contact with and (suspected) exposure to covid-19: Secondary | ICD-10-CM

## 2020-11-26 DIAGNOSIS — J029 Acute pharyngitis, unspecified: Secondary | ICD-10-CM

## 2020-11-26 DIAGNOSIS — U071 COVID-19: Secondary | ICD-10-CM

## 2020-11-26 LAB — NOVEL CORONAVIRUS, NAA: SARS-CoV-2, NAA: DETECTED — AB

## 2020-11-26 LAB — SARS-COV-2, NAA 2 DAY TAT

## 2020-11-26 MED ORDER — MOLNUPIRAVIR EUA 200MG CAPSULE: 4.0000 | ORAL_CAPSULE | Freq: Two times a day (BID) | ORAL | 0 refills | Status: AC

## 2020-11-26 MED ORDER — BENZONATATE 100 MG PO CAPS: 100.0000 mg | ORAL_CAPSULE | Freq: Three times a day (TID) | ORAL | 0 refills | Status: DC | PRN

## 2020-11-26 MED ORDER — MAGIC MOUTHWASH W/LIDOCAINE: ORAL | 0 refills | Status: DC

## 2020-11-26 NOTE — Telephone Encounter (Signed)
Pt called stating that the medicine that Dr Lajuana Ripple was going to send in for him yesterday was not sent to [facility].  Please send to [facility] ASAP.

## 2020-11-26 NOTE — Telephone Encounter (Signed)
Pt aware results, wants covid medicine

## 2020-11-28 ENCOUNTER — Telehealth: Payer: Self-pay | Admitting: Family Medicine

## 2020-11-28 DIAGNOSIS — J029 Acute pharyngitis, unspecified: Secondary | ICD-10-CM

## 2020-11-28 DIAGNOSIS — Z20822 Contact with and (suspected) exposure to covid-19: Secondary | ICD-10-CM

## 2020-11-28 MED ORDER — MAGIC MOUTHWASH W/LIDOCAINE: ORAL | 0 refills | Status: DC

## 2020-11-28 NOTE — Telephone Encounter (Signed)
Prescription sent to pharmacy.

## 2020-12-09 ENCOUNTER — Ambulatory Visit: Payer: Medicare Other | Admitting: Family Medicine

## 2020-12-12 ENCOUNTER — Encounter: Payer: Self-pay | Admitting: Family Medicine

## 2020-12-12 ENCOUNTER — Ambulatory Visit (INDEPENDENT_AMBULATORY_CARE_PROVIDER_SITE_OTHER): Payer: Medicare Other | Admitting: Family Medicine

## 2020-12-12 ENCOUNTER — Other Ambulatory Visit: Payer: Self-pay

## 2020-12-12 VITALS — BP 137/83 | HR 84 | Temp 97.6°F | Ht 66.0 in | Wt 230.0 lb

## 2020-12-12 DIAGNOSIS — F320 Major depressive disorder, single episode, mild: Secondary | ICD-10-CM | POA: Diagnosis not present

## 2020-12-12 NOTE — Progress Notes (Signed)
   Assessment & Plan:  1. Depression, major, single episode, mild ([place]) GeneSight testing completed today.   Follow up plan: Return as directed by PCP.  Hendricks Limes, MSN, APRN, [name]-C [facility]  Subjective:   Patient ID: Billy Rasmussen, male    DOB: February 28, 1946, 76 y.o.   MRN: [id number]  HPI: Billy Rasmussen is a 75 y.o. male presenting on 12/12/2020 for genesight  Patient is here for GeneSight testing at the request of his PCP.  He has failed therapy with Wellbutrin.   ROS: Negative unless specifically indicated above in HPI.   Relevant past medical history reviewed and updated as indicated.   Allergies and medications reviewed and updated.   Current Outpatient Medications:    buPROPion (WELLBUTRIN XL) 150 MG 24 hr tablet, Take 1 tablet (150 mg total) by mouth daily., Disp: 90 tablet, Rfl: 0   docusate sodium (COLACE) 100 MG capsule, Take 200 mg by mouth daily. , Disp: , Rfl:    lisinopril (ZESTRIL) 5 MG tablet, Take 1 tablet (5 mg total) by mouth daily., Disp: 90 tablet, Rfl: 3   magic mouthwash w/lidocaine SOLN, Gargle and spit 17mL every 6 hours as needed for sore throat. 105mL lidocaine, 12mL nystatin, 60mg  hydrocortisone tab, qs benadryl total 450mL., Disp: 480 mL, Rfl: 0   meclizine (ANTIVERT) 25 MG tablet, meclizine 25 mg tablet, Disp: , Rfl:    Multiple Vitamin (MULTIVITAMIN ADULT) TABS, Take 1 tablet by mouth daily., Disp: , Rfl:    OVER THE COUNTER MEDICATION, Take 1 tablet by mouth daily. , Disp: , Rfl:    polyvinyl alcohol (LIQUIFILM TEARS) 1.4 % ophthalmic solution, Place 1 drop into both eyes as needed for dry eyes., Disp: 15 mL, Rfl: 0  No Known Allergies  Objective:   BP 137/83   Pulse 84   Temp 97.6 F (36.4 C) (Temporal)   Ht 5\' 6"  (1.676 m)   Wt 230 lb (104.3 kg)   SpO2 95%   BMI 37.12 kg/m    Physical Exam Vitals reviewed.  Constitutional:      General: He is not in acute distress.    Appearance: Normal appearance.  He is obese. He is not ill-appearing, toxic-appearing or diaphoretic.  HENT:     Head: Normocephalic and atraumatic.  Eyes:     General: No scleral icterus.       Right eye: No discharge.        Left eye: No discharge.     Conjunctiva/sclera: Conjunctivae normal.  Cardiovascular:     Rate and Rhythm: Normal rate.  Pulmonary:     Effort: Pulmonary effort is normal. No respiratory distress.  Musculoskeletal:        General: Normal range of motion.     Cervical back: Normal range of motion.  Skin:    General: Skin is warm and dry.  Neurological:     Mental Status: He is alert and oriented to person, place, and time. Mental status is at baseline.  Psychiatric:        Mood and Affect: Mood normal.        Behavior: Behavior normal.        Thought Content: Thought content normal.        Judgment: Judgment normal.

## 2020-12-23 DIAGNOSIS — M9903 Segmental and somatic dysfunction of lumbar region: Secondary | ICD-10-CM | POA: Diagnosis not present

## 2020-12-23 DIAGNOSIS — M9902 Segmental and somatic dysfunction of thoracic region: Secondary | ICD-10-CM | POA: Diagnosis not present

## 2020-12-23 DIAGNOSIS — M62830 Muscle spasm of back: Secondary | ICD-10-CM | POA: Diagnosis not present

## 2020-12-23 DIAGNOSIS — M9901 Segmental and somatic dysfunction of cervical region: Secondary | ICD-10-CM | POA: Diagnosis not present

## 2020-12-24 DIAGNOSIS — M9903 Segmental and somatic dysfunction of lumbar region: Secondary | ICD-10-CM | POA: Diagnosis not present

## 2020-12-24 DIAGNOSIS — M62830 Muscle spasm of back: Secondary | ICD-10-CM | POA: Diagnosis not present

## 2020-12-24 DIAGNOSIS — M9902 Segmental and somatic dysfunction of thoracic region: Secondary | ICD-10-CM | POA: Diagnosis not present

## 2020-12-24 DIAGNOSIS — M9901 Segmental and somatic dysfunction of cervical region: Secondary | ICD-10-CM | POA: Diagnosis not present

## 2020-12-25 DIAGNOSIS — M9901 Segmental and somatic dysfunction of cervical region: Secondary | ICD-10-CM | POA: Diagnosis not present

## 2020-12-25 DIAGNOSIS — M62830 Muscle spasm of back: Secondary | ICD-10-CM | POA: Diagnosis not present

## 2020-12-25 DIAGNOSIS — M9903 Segmental and somatic dysfunction of lumbar region: Secondary | ICD-10-CM | POA: Diagnosis not present

## 2020-12-25 DIAGNOSIS — M9902 Segmental and somatic dysfunction of thoracic region: Secondary | ICD-10-CM | POA: Diagnosis not present

## 2021-01-01 DIAGNOSIS — M9902 Segmental and somatic dysfunction of thoracic region: Secondary | ICD-10-CM | POA: Diagnosis not present

## 2021-01-01 DIAGNOSIS — M62830 Muscle spasm of back: Secondary | ICD-10-CM | POA: Diagnosis not present

## 2021-01-01 DIAGNOSIS — M9901 Segmental and somatic dysfunction of cervical region: Secondary | ICD-10-CM | POA: Diagnosis not present

## 2021-01-01 DIAGNOSIS — M9903 Segmental and somatic dysfunction of lumbar region: Secondary | ICD-10-CM | POA: Diagnosis not present

## 2021-01-02 DIAGNOSIS — M62830 Muscle spasm of back: Secondary | ICD-10-CM | POA: Diagnosis not present

## 2021-01-02 DIAGNOSIS — M9901 Segmental and somatic dysfunction of cervical region: Secondary | ICD-10-CM | POA: Diagnosis not present

## 2021-01-02 DIAGNOSIS — M9902 Segmental and somatic dysfunction of thoracic region: Secondary | ICD-10-CM | POA: Diagnosis not present

## 2021-01-02 DIAGNOSIS — M9903 Segmental and somatic dysfunction of lumbar region: Secondary | ICD-10-CM | POA: Diagnosis not present

## 2021-01-06 DIAGNOSIS — M9903 Segmental and somatic dysfunction of lumbar region: Secondary | ICD-10-CM | POA: Diagnosis not present

## 2021-01-06 DIAGNOSIS — M62830 Muscle spasm of back: Secondary | ICD-10-CM | POA: Diagnosis not present

## 2021-01-06 DIAGNOSIS — M9902 Segmental and somatic dysfunction of thoracic region: Secondary | ICD-10-CM | POA: Diagnosis not present

## 2021-01-06 DIAGNOSIS — M9901 Segmental and somatic dysfunction of cervical region: Secondary | ICD-10-CM | POA: Diagnosis not present

## 2021-01-07 ENCOUNTER — Ambulatory Visit (INDEPENDENT_AMBULATORY_CARE_PROVIDER_SITE_OTHER): Payer: Medicare Other | Admitting: Family Medicine

## 2021-01-07 ENCOUNTER — Encounter: Payer: Self-pay | Admitting: Family Medicine

## 2021-01-07 ENCOUNTER — Other Ambulatory Visit: Payer: Self-pay

## 2021-01-07 ENCOUNTER — Telehealth: Payer: Self-pay | Admitting: Family Medicine

## 2021-01-07 VITALS — BP 132/83 | HR 79 | Temp 98.2°F | Ht 66.0 in | Wt 226.2 lb

## 2021-01-07 DIAGNOSIS — M545 Low back pain, unspecified: Secondary | ICD-10-CM | POA: Diagnosis not present

## 2021-01-07 DIAGNOSIS — F320 Major depressive disorder, single episode, mild: Secondary | ICD-10-CM | POA: Diagnosis not present

## 2021-01-07 DIAGNOSIS — G8929 Other chronic pain: Secondary | ICD-10-CM

## 2021-01-07 MED ORDER — DULOXETINE HCL 60 MG PO CPEP: 60.0000 mg | ORAL_CAPSULE | Freq: Every day | ORAL | 0 refills | Status: DC

## 2021-01-07 MED ORDER — DESVENLAFAXINE SUCCINATE ER 50 MG PO TB24: 50.0000 mg | ORAL_TABLET | Freq: Every day | ORAL | 0 refills | Status: DC

## 2021-01-07 NOTE — Progress Notes (Addendum)
Subjective: CC: Reviewed GeneSight results PCP: Janora Norlander, DO [id number] Billy Rasmussen is a 75 y.o. male presenting to clinic today for:  1.  Depressive disorder Patient trialed Wellbutrin but did not find this to be especially helpful.  No side effects.  He has been discontinued off this medicine for a bit.  Here to review GeneSight testing results.  Did not get a copy at home.  Continues to have anhedonia, decreased drive.  Purchased a large piece of stone recently that he was looking forward to carving out but again finds his normal excitement surrounding these types of things to be less than baseline.   ROS: Per HPI  No Known Allergies Past Medical History:  Diagnosis Date   Arthritis    knees, shoulders, elbows   Bladder cancer [facility])    urologist-  dr eskridge   Diverticulosis of colon    Fibromyalgia    GERD (gastroesophageal reflux disease)    History of diverticulitis of colon    History of gastric ulcer    due to aleve   Hypertension    Lower urinary tract symptoms (LUTS)    OSA (obstructive sleep apnea)    NON-COMPLIANT  CPAP  --- BUT PT USES OXYGEN AT NIGHT 2.5L VIA [place] (PT'S DECISION)   PONV (postoperative nausea and vomiting)    Psoriasis    Sepsis ([place])    january 2021   Tinnitus    right ear more, has tranmitter in right ear removable at hs   Wears glasses    Wears partial dentures     Current Outpatient Medications:    lisinopril (ZESTRIL) 5 MG tablet, Take 1 tablet (5 mg total) by mouth daily., Disp: 90 tablet, Rfl: 3   meclizine (ANTIVERT) 25 MG tablet, meclizine 25 mg tablet, Disp: , Rfl:    Multiple Vitamin (MULTIVITAMIN ADULT) TABS, Take 1 tablet by mouth daily., Disp: , Rfl:    OVER THE COUNTER MEDICATION, Take 1 tablet by mouth daily. , Disp: , Rfl:    polyvinyl alcohol (LIQUIFILM TEARS) 1.4 % ophthalmic solution, Place 1 drop into both eyes as needed for dry eyes., Disp: 15 mL, Rfl: 0   docusate sodium (COLACE) 100 MG capsule, Take 200 mg  by mouth daily.  (Patient not taking: Reported on 01/07/2021), Disp: , Rfl:  Social History   Socioeconomic History   Marital status: Married    Spouse name: Peggy   Number of children: 2   Years of education: 14   Highest education level: Associate degree: occupational, Hotel manager, or vocational program  Occupational History   Occupation: retired    Comment: [employer], 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   Tobacco Use   Smoking status: Former    Packs/day: 2.00    Years: 40.00    Pack years: 80.00    Types: Cigarettes    Quit date: 06/01/1997    Years since quitting: 23.6   Smokeless tobacco: Never  Vaping Use   Vaping Use: Never used  Substance and Sexual Activity   Alcohol use: No   Drug use: No   Sexual activity: Not Currently  Other Topics Concern   Not on file  Social History Narrative   Not on file   Social Determinants of Health   Financial Resource Strain: Low Risk    Difficulty of Paying Living Expenses: Not hard at all  Food Insecurity: No Food Insecurity   Worried About Charity fundraiser in the Last Year: Never true   [place]  in the Last Year: Never true  Transportation Needs: No Transportation Needs   Lack of Transportation (Medical): No   Lack of Transportation (Non-Medical): No  Physical Activity: Inactive   Days of Exercise per Week: 0 days   Minutes of Exercise per Session: 0 min  Stress: No Stress Concern Present   Feeling of Stress : Not at all  Social Connections: Socially Integrated   Frequency of Communication with Friends and Family: More than three times a week   Frequency of Social Gatherings with Friends and Family: More than three times a week   Attends Religious Services: More than 4 times per year   Active Member of [employer] or Organizations: Yes   Attends Music therapist: More than 4 times per year   Marital Status: Married  Human resources officer Violence: Not At Risk   Fear of Current or Ex-Partner: No   Emotionally Abused: No   Physically  Abused: No   Sexually Abused: No   Family History  Problem Relation Age of Onset   Alzheimer's disease Mother    Heart attack Father    Heart disease Father    Irregular heart beat Brother        DEFIB.   Congestive Heart Failure Brother    Diabetes Daughter     Objective: Office vital signs reviewed. BP 132/83   Pulse 79   Temp 98.2 F (36.8 C)   Ht '5\' 6"'$  (1.676 m)   Wt 226 lb 3.2 oz (102.6 kg)   SpO2 96%   BMI 36.51 kg/m   Physical Examination:  General: Awake, alert, well nourished, No acute distress Psych: Mood is stable.  Patient is pleasant and interactive.  Somewhat cynical.  Depression screen [facility] 2/9 01/07/2021 12/12/2020 10/21/2020  Decreased Interest '2 1 2  '$ Down, Depressed, Hopeless '1 1 2  '$ PHQ - 2 Score '3 2 4  '$ Altered sleeping 0 0 1  Tired, decreased energy '1 1 3  '$ Change in appetite 0 0 1  Feeling bad or failure about yourself  0 0 0  Trouble concentrating 1 0 0  Moving slowly or fidgety/restless 0 0 0  Suicidal thoughts 0 0 0  PHQ-9 Score '5 3 9  '$ Difficult doing work/chores Somewhat difficult Not difficult at all Somewhat difficult  Some recent data might be hidden   GAD 7 : Generalized Anxiety Score 01/07/2021 12/12/2020 10/21/2020 10/21/2020  Nervous, Anxious, on Edge 0 0 0 0  Control/stop worrying 0 0 0 0  Worry too much - different things 0 0 0 0  Trouble relaxing 0 0 0 0  Restless 0 0 0 0  Easily annoyed or irritable 1 0 0 0  Afraid - awful might happen 1 0 0 0  Total GAD 7 Score 2 0 0 0  Anxiety Difficulty Somewhat difficult Not difficult at all Not difficult at all -               Assessment/ Plan: 75 y.o. male   Depression, major, single episode, mild (H[facility]) - Plan: DULoxetine (CYMBALTA) 60 MG capsule  Chronic bilateral low back pain without sciatica - Plan: DULoxetine (CYMBALTA) 60 MG capsule  Trial Cymbalta 60 mg daily.  We will follow-up in the next 1/2 to 2 months for recheck.  He will contact me sooner if needed.  Additionally,  patient to contact me about the patches that he has been sampled out by his family member.  No orders of the defined types were placed in this  encounter.  Meds ordered this encounter  Medications   DISCONTD: DULoxetine (CYMBALTA) 60 MG capsule    Sig: Take 1 capsule (60 mg total) by mouth daily.    Dispense:  90 capsule    Refill:  0   desvenlafaxine (PRISTIQ) 50 MG 24 hr tablet    Sig: Take 1 tablet (50 mg total) by mouth daily.    Dispense:  90 tablet    Refill:  0    ** patient called back stating that 90 days of Cymbalta was over $500.  Will send alternative in but discussed if this is reflective of a deductible, there may not be a way around this cost.  Janora Norlander, [place] [phone]

## 2021-01-07 NOTE — Addendum Note (Signed)
Addended by: Janora Norlander on: 01/07/2021 05:05 PM   Modules accepted: Orders

## 2021-01-07 NOTE — Patient Instructions (Signed)
Taking the medicine as directed and not missing any doses is one of the best things you can do to treat your depression.  Here are some things to keep in mind:  Side effects (stomach upset, some increased anxiety) may happen before you notice a benefit.  These side effects typically go away over time. Changes to your dose of medicine or a change in medication all together is sometimes necessary Most people need to be on medication at least 12 months Many people will notice an improvement within two weeks but the full effect of the medication can take up to 4-6 weeks Stopping the medication when you start feeling better often results in a return of symptoms Never discontinue your medication without contacting a health care professional first.  Some medications require gradual discontinuation/ taper and can make you sick if you stop them abruptly.  If your symptoms worsen or you have thoughts of suicide/homicide, PLEASE SEEK IMMEDIATE MEDICAL ATTENTION.  You may always call:  National Suicide Hotline: 7542058722 [place]: [phone] Crisis Recovery in [place]: 2548116572   These are available 24 hours a day, 7 days a week.

## 2021-01-07 NOTE — Telephone Encounter (Signed)
Pt aware he can use lidocaine patch and aware g is sending in extra meds

## 2021-01-08 DIAGNOSIS — M9903 Segmental and somatic dysfunction of lumbar region: Secondary | ICD-10-CM | POA: Diagnosis not present

## 2021-01-08 DIAGNOSIS — M9902 Segmental and somatic dysfunction of thoracic region: Secondary | ICD-10-CM | POA: Diagnosis not present

## 2021-01-08 DIAGNOSIS — M62830 Muscle spasm of back: Secondary | ICD-10-CM | POA: Diagnosis not present

## 2021-01-08 DIAGNOSIS — M9901 Segmental and somatic dysfunction of cervical region: Secondary | ICD-10-CM | POA: Diagnosis not present

## 2021-01-09 ENCOUNTER — Telehealth: Payer: Self-pay

## 2021-01-09 ENCOUNTER — Other Ambulatory Visit: Payer: Self-pay

## 2021-01-09 NOTE — Telephone Encounter (Signed)
Mukund Swenson KeyCharlie Pitter - PA Case ID: [id number] - Rx #[id number] Need help? Call us at [phone] Status Sent to [facility] Desvenlafaxine Succinate ER '50MG'$  er tablets

## 2021-01-10 ENCOUNTER — Other Ambulatory Visit: Payer: Self-pay | Admitting: Family Medicine

## 2021-01-10 DIAGNOSIS — F320 Major depressive disorder, single episode, mild: Secondary | ICD-10-CM

## 2021-01-10 NOTE — Telephone Encounter (Signed)
PA denied appeal sent to plan

## 2021-01-13 ENCOUNTER — Telehealth: Payer: Self-pay

## 2021-01-13 NOTE — Telephone Encounter (Signed)
Pa approved

## 2021-01-14 ENCOUNTER — Encounter: Payer: Self-pay | Admitting: Family Medicine

## 2021-01-14 ENCOUNTER — Other Ambulatory Visit: Payer: Self-pay | Admitting: Family Medicine

## 2021-01-14 DIAGNOSIS — G8929 Other chronic pain: Secondary | ICD-10-CM

## 2021-01-14 DIAGNOSIS — F320 Major depressive disorder, single episode, mild: Secondary | ICD-10-CM

## 2021-01-14 DIAGNOSIS — M545 Low back pain, unspecified: Secondary | ICD-10-CM

## 2021-01-14 MED ORDER — VENLAFAXINE HCL ER 75 MG PO CP24: 75.0000 mg | ORAL_CAPSULE | Freq: Every day | ORAL | 0 refills | Status: DC

## 2021-01-14 NOTE — Telephone Encounter (Signed)
Pharmacy aware that PA approved.

## 2021-01-15 DIAGNOSIS — M9901 Segmental and somatic dysfunction of cervical region: Secondary | ICD-10-CM | POA: Diagnosis not present

## 2021-01-15 DIAGNOSIS — M9902 Segmental and somatic dysfunction of thoracic region: Secondary | ICD-10-CM | POA: Diagnosis not present

## 2021-01-15 DIAGNOSIS — M9903 Segmental and somatic dysfunction of lumbar region: Secondary | ICD-10-CM | POA: Diagnosis not present

## 2021-01-15 DIAGNOSIS — M62830 Muscle spasm of back: Secondary | ICD-10-CM | POA: Diagnosis not present

## 2021-01-23 DIAGNOSIS — M62830 Muscle spasm of back: Secondary | ICD-10-CM | POA: Diagnosis not present

## 2021-01-23 DIAGNOSIS — M9903 Segmental and somatic dysfunction of lumbar region: Secondary | ICD-10-CM | POA: Diagnosis not present

## 2021-01-23 DIAGNOSIS — M9901 Segmental and somatic dysfunction of cervical region: Secondary | ICD-10-CM | POA: Diagnosis not present

## 2021-01-23 DIAGNOSIS — M9902 Segmental and somatic dysfunction of thoracic region: Secondary | ICD-10-CM | POA: Diagnosis not present

## 2021-03-06 DIAGNOSIS — L859 Epidermal thickening, unspecified: Secondary | ICD-10-CM | POA: Diagnosis not present

## 2021-03-06 DIAGNOSIS — D225 Melanocytic nevi of trunk: Secondary | ICD-10-CM | POA: Diagnosis not present

## 2021-03-06 DIAGNOSIS — L821 Other seborrheic keratosis: Secondary | ICD-10-CM | POA: Diagnosis not present

## 2021-03-06 DIAGNOSIS — L905 Scar conditions and fibrosis of skin: Secondary | ICD-10-CM | POA: Diagnosis not present

## 2021-03-06 DIAGNOSIS — D2372 Other benign neoplasm of skin of left lower limb, including hip: Secondary | ICD-10-CM | POA: Diagnosis not present

## 2021-03-06 DIAGNOSIS — D485 Neoplasm of uncertain behavior of skin: Secondary | ICD-10-CM | POA: Diagnosis not present

## 2021-04-04 ENCOUNTER — Ambulatory Visit: Payer: Medicare Other

## 2021-04-14 ENCOUNTER — Other Ambulatory Visit: Payer: Self-pay | Admitting: Family Medicine

## 2021-04-14 DIAGNOSIS — F320 Major depressive disorder, single episode, mild: Secondary | ICD-10-CM

## 2021-04-14 DIAGNOSIS — G8929 Other chronic pain: Secondary | ICD-10-CM

## 2021-04-14 DIAGNOSIS — M545 Low back pain, unspecified: Secondary | ICD-10-CM

## 2021-04-16 DIAGNOSIS — Z23 Encounter for immunization: Secondary | ICD-10-CM | POA: Diagnosis not present

## 2021-05-08 ENCOUNTER — Other Ambulatory Visit: Payer: Self-pay | Admitting: Family Medicine

## 2021-05-08 DIAGNOSIS — F320 Major depressive disorder, single episode, mild: Secondary | ICD-10-CM

## 2021-05-08 DIAGNOSIS — G8929 Other chronic pain: Secondary | ICD-10-CM

## 2021-05-08 DIAGNOSIS — M545 Low back pain, unspecified: Secondary | ICD-10-CM

## 2021-05-13 ENCOUNTER — Telehealth: Payer: Self-pay | Admitting: Family Medicine

## 2021-05-13 NOTE — Telephone Encounter (Signed)
Pt called to let Dr Lajuana Ripple know that the Billy Rasmussen Rx that he has been taking is not working for him. Pt doesn't see any changes since starting the medication and would like to stop taking it.  Needs advise on what to do.

## 2021-05-13 NOTE — Telephone Encounter (Signed)
Next step would be to see a psychiatrist.  Not sure where to go next with his meds.

## 2021-05-14 ENCOUNTER — Other Ambulatory Visit: Payer: Self-pay | Admitting: Family Medicine

## 2021-05-14 MED ORDER — VENLAFAXINE HCL 37.5 MG PO TABS: ORAL_TABLET | ORAL | 0 refills | Status: DC

## 2021-05-14 NOTE — Telephone Encounter (Signed)
PT AWARE OF RECOMM

## 2021-05-14 NOTE — Telephone Encounter (Signed)
Ok. I can send in the short acting for him to start weaning himself off

## 2021-05-14 NOTE — Telephone Encounter (Signed)
Please clarify instructions on sig: of rx

## 2021-05-14 NOTE — Telephone Encounter (Signed)
PT STATES HE WOULD LIKE TO STOP TAKING, STATES HE FEELS GOOD MENTALLY AND PHYSICALLY

## 2021-06-04 ENCOUNTER — Other Ambulatory Visit: Payer: Self-pay | Admitting: Family Medicine

## 2021-08-11 ENCOUNTER — Other Ambulatory Visit: Payer: Self-pay | Admitting: Family Medicine

## 2021-08-12 ENCOUNTER — Ambulatory Visit (INDEPENDENT_AMBULATORY_CARE_PROVIDER_SITE_OTHER): Payer: Medicare Other

## 2021-08-12 VITALS — Wt 240.0 lb

## 2021-08-12 DIAGNOSIS — Z0000 Encounter for general adult medical examination without abnormal findings: Secondary | ICD-10-CM

## 2021-08-12 NOTE — Patient Instructions (Signed)
Billy Rasmussen , ?Thank you for taking time to come for your Medicare Wellness Visit. I appreciate your ongoing commitment to your health goals. Please review the following plan we discussed and let me know if I can assist you in the future.  ? ?Screening recommendations/referrals: ?Colonoscopy: Done 05/11/2018 - no repeat required ?Recommended yearly ophthalmology/optometry visit for glaucoma screening and checkup ?Recommended yearly dental visit for hygiene and checkup ? ?Vaccinations: ?Influenza vaccine: Done 04/16/2021 - Repeat annually ?Pneumococcal vaccine: Done 12/27/17 & 08/21/2019 ?Tdap vaccine: Done 08/15/2020 - Repeat in 10 years ?Shingles vaccine: Due - get first dose at your next visit - Shingrix is 2 doses 2-6 months apart and over 90% effective     ?Covid-19: Done 11/14/2019 & 12/12/2019 - If you decide you want boosters, contact pharmacy ? ?Advanced directives: Please bring a copy of your health care power of attorney and living will to the office to be added to your chart at your convenience.  ? ?Conditions/risks identified: Aim for 30 minutes of exercise or brisk walking, 6-8 glasses of water, and 5 servings of fruits and vegetables each day.  ? ?Next appointment: Follow up in one year for your annual wellness visit.  ? ?Preventive Care 74 Years and Older, Male ? ?Preventive care refers to lifestyle choices and visits with your health care provider that can promote health and wellness. ?What does preventive care include? ?A yearly physical exam. This is also called an annual well check. ?Dental exams once or twice a year. ?Routine eye exams. Ask your health care provider how often you should have your eyes checked. ?Personal lifestyle choices, including: ?Daily care of your teeth and gums. ?Regular physical activity. ?Eating a healthy diet. ?Avoiding tobacco and drug use. ?Limiting alcohol use. ?Practicing safe sex. ?Taking low doses of aspirin every day. ?Taking vitamin and mineral supplements as  recommended by your health care provider. ?What happens during an annual well check? ?The services and screenings done by your health care provider during your annual well check will depend on your age, overall health, lifestyle risk factors, and family history of disease. ?Counseling  ?Your health care provider may ask you questions about your: ?Alcohol use. ?Tobacco use. ?Drug use. ?Emotional well-being. ?Home and relationship well-being. ?Sexual activity. ?Eating habits. ?History of falls. ?Memory and ability to understand (cognition). ?Work and work Statistician. ?Screening  ?You may have the following tests or measurements: ?Height, weight, and BMI. ?Blood pressure. ?Lipid and cholesterol levels. These may be checked every 5 years, or more frequently if you are over 46 years old. ?Skin check. ?Lung cancer screening. You may have this screening every year starting at age 40 if you have a 30-pack-year history of smoking and currently smoke or have quit within the past 15 years. ?Fecal occult blood test (FOBT) of the stool. You may have this test every year starting at age 46. ?Flexible sigmoidoscopy or colonoscopy. You may have a sigmoidoscopy every 5 years or a colonoscopy every 10 years starting at age 69. ?Prostate cancer screening. Recommendations will vary depending on your family history and other risks. ?Hepatitis C blood test. ?Hepatitis B blood test. ?Sexually transmitted disease (STD) testing. ?Diabetes screening. This is done by checking your blood sugar (glucose) after you have not eaten for a while (fasting). You may have this done every 1-3 years. ?Abdominal aortic aneurysm (AAA) screening. You may need this if you are a current or former smoker. ?Osteoporosis. You may be screened starting at age 25 if you are at high  risk. ?Talk with your health care provider about your test results, treatment options, and if necessary, the need for more tests. ?Vaccines  ?Your health care provider may recommend  certain vaccines, such as: ?Influenza vaccine. This is recommended every year. ?Tetanus, diphtheria, and acellular pertussis (Tdap, Td) vaccine. You may need a Td booster every 10 years. ?Zoster vaccine. You may need this after age 37. ?Pneumococcal 13-valent conjugate (PCV13) vaccine. One dose is recommended after age 40. ?Pneumococcal polysaccharide (PPSV23) vaccine. One dose is recommended after age 43. ?Talk to your health care provider about which screenings and vaccines you need and how often you need them. ?This information is not intended to replace advice given to you by your health care provider. Make sure you discuss any questions you have with your health care provider. ?Document Released: 06/14/2015 Document Revised: 02/05/2016 Document Reviewed: 03/19/2015 ?Elsevier Interactive Patient Education ? 2017 [place]. ? ?Fall Prevention in the Home ?Falls can cause injuries. They can happen to people of all ages. There are many things you can do to make your home safe and to help prevent falls. ?What can I do on the outside of my home? ?Regularly fix the edges of walkways and driveways and fix any cracks. ?Remove anything that might make you trip as you walk through a door, such as a raised step or threshold. ?Trim any bushes or trees on the path to your home. ?Use bright outdoor lighting. ?Clear any walking paths of anything that might make someone trip, such as rocks or tools. ?Regularly check to see if handrails are loose or broken. Make sure that both sides of any steps have handrails. ?Any raised decks and porches should have guardrails on the edges. ?Have any leaves, snow, or ice cleared regularly. ?Use sand or salt on walking paths during winter. ?Clean up any spills in your garage right away. This includes oil or grease spills. ?What can I do in the bathroom? ?Use night lights. ?Install grab bars by the toilet and in the tub and shower. Do not use towel bars as grab bars. ?Use non-skid mats or  decals in the tub or shower. ?If you need to sit down in the shower, use a plastic, non-slip stool. ?Keep the floor dry. Clean up any water that spills on the floor as soon as it happens. ?Remove soap buildup in the tub or shower regularly. ?Attach bath mats securely with double-sided non-slip rug tape. ?Do not have throw rugs and other things on the floor that can make you trip. ?What can I do in the bedroom? ?Use night lights. ?Make sure that you have a light by your bed that is easy to reach. ?Do not use any sheets or blankets that are too big for your bed. They should not hang down onto the floor. ?Have a firm chair that has side arms. You can use this for support while you get dressed. ?Do not have throw rugs and other things on the floor that can make you trip. ?What can I do in the kitchen? ?Clean up any spills right away. ?Avoid walking on wet floors. ?Keep items that you use a lot in easy-to-reach places. ?If you need to reach something above you, use a strong step stool that has a grab bar. ?Keep electrical cords out of the way. ?Do not use floor polish or wax that makes floors slippery. If you must use wax, use non-skid floor wax. ?Do not have throw rugs and other things on the floor that can  make you trip. ?What can I do with my stairs? ?Do not leave any items on the stairs. ?Make sure that there are handrails on both sides of the stairs and use them. Fix handrails that are broken or loose. Make sure that handrails are as long as the stairways. ?Check any carpeting to make sure that it is firmly attached to the stairs. Fix any carpet that is loose or worn. ?Avoid having throw rugs at the top or bottom of the stairs. If you do have throw rugs, attach them to the floor with carpet tape. ?Make sure that you have a light switch at the top of the stairs and the bottom of the stairs. If you do not have them, ask someone to add them for you. ?What else can I do to help prevent falls? ?Wear shoes that: ?Do not  have high heels. ?Have rubber bottoms. ?Are comfortable and fit you well. ?Are closed at the toe. Do not wear sandals. ?If you use a stepladder: ?Make sure that it is fully opened. Do not climb a closed stepladder.

## 2021-08-12 NOTE — Progress Notes (Signed)
? ?Subjective:  ? Billy Rasmussen is a 76 y.o. male who presents for Medicare Annual/Subsequent preventive examination. ? ?Virtual Visit via Telephone Note ? ?I connected with  Billy Rasmussen on 08/12/21 at  9:00 AM EDT by telephone and verified that I am speaking with the correct person using two identifiers. ? ?Location: ?Patient: Home ?Provider: [facility] ?Persons participating in the virtual visit: patient/Nurse Health Advisor ?  ?I discussed the limitations, risks, security and privacy concerns of performing an evaluation and management service by telephone and the availability of in person appointments. The patient expressed understanding and agreed to proceed. ? ?Interactive audio and video telecommunications were attempted between this nurse and patient, however failed, due to patient having technical difficulties OR patient did not have access to video capability.  We continued and completed visit with audio only. ? ?Some vital signs may be absent or patient reported.  ? ?Billy Chalker Dionne Ano, LPN  ? ?Review of Systems    ? ?Cardiac Risk Factors include: advanced age (>80mn, >>70women);obesity (BMI >30kg/m2);sedentary lifestyle;male gender;hypertension;smoking/ tobacco exposure;dyslipidemia;Other (see comment), Risk factor comments: OSA on O2 @ 2lpm at night only, s/p cystoprostectomy and nephrectomy ? ?   ?Objective:  ?  ?Today's Vitals  ? 08/12/21 0902  ?Weight: 240 lb (108.9 kg)  ?PainSc: 3   ? ?Body mass index is 38.74 kg/m?. ? ?Advanced Directives 08/12/2021 08/06/2020 03/21/2020 12/28/2019 12/28/2019 07/14/2019 07/07/2019  ?Does Patient Have a Medical Advance Directive? Yes No No No No No -  ?[place] - - - - - -  ?Does patient want to make changes to medical advance directive? - - - - - - -  ?Copy of [place]? No - copy requested - - - - - -  ?Would patient like information on creating a medical advance directive? - No - Patient  declined Yes (Inpatient - patient requests chaplain consult to create a medical advance directive) No - Patient declined - No - Patient declined No - Patient declined  ? ? ?Current Medications (verified) ?Outpatient Encounter Medications as of 08/12/2021  ?Medication Sig  ? lisinopril (ZESTRIL) 5 MG tablet Take 1 tablet (5 mg total) by mouth daily. (NEEDS TO BE SEEN BEFORE NEXT REFILL)  ? Multiple Vitamin (MULTIVITAMIN ADULT) TABS Take 1 tablet by mouth daily.  ? OVER THE COUNTER MEDICATION Take 1 tablet by mouth daily.   ? polyvinyl alcohol (LIQUIFILM TEARS) 1.4 % ophthalmic solution Place 1 drop into both eyes as needed for dry eyes.  ? docusate sodium (COLACE) 100 MG capsule Take 200 mg by mouth daily.  (Patient not taking: Reported on 08/12/2021)  ? meclizine (ANTIVERT) 25 MG tablet meclizine 25 mg tablet (Patient not taking: Reported on 08/12/2021)  ? [DISCONTINUED] venlafaxine (EFFEXOR) 37.5 MG tablet Take 1 tablet (37.5 mg total) by mouth 2 (two) times daily for 14 days, THEN 1 tablet (37.5 mg total) daily for 14 days. Then stop.  ? ?No facility-administered encounter medications on file as of 08/12/2021.  ? ? ?Allergies (verified) ?Patient has no known allergies.  ? ?History: ?Past Medical History:  ?Diagnosis Date  ? Arthritis   ? knees, shoulders, elbows  ? [place]   ? urologist-  dr eJunious Silk ? Diverticulosis of colon   ? Fibromyalgia   ? GERD (gastroesophageal reflux disease)   ? History of diverticulitis of colon   ? History of gastric ulcer   ? due to aleve  ?  Hypertension   ? Lower urinary tract symptoms (LUTS)   ? OSA (obstructive sleep apnea)   ? NON-COMPLIANT  CPAP  --- BUT PT USES OXYGEN AT NIGHT 2.5L VIA [employer] (PT'S DECISION)  ? PONV (postoperative nausea and vomiting)   ? Psoriasis   ? Sepsis [facility])   ? january 2021  ? Tinnitus   ? right ear more, has tranmitter in right ear removable at hs  ? Wears glasses   ? Wears partial dentures   ? ?Past Surgical History:  ?Procedure Laterality Date   ? APPENDECTOMY    ? COLECTOMY W/ COLOSTOMY  1996  ? W/   APPENDECTOMY  ? COLONOSCOPY N/A 05/11/2018  ? Procedure: COLONOSCOPY;  Surgeon: Daneil Dolin, MD;  [facility];  Service: Endoscopy;  Laterality: N/A;  9:30  ? COLOSTOMY TAKEDOWN  1996  ? CYSTOSCOPY WITH BIOPSY N/A 12/05/2013  ? Procedure: CYSTO BLADDER BIOPSY AND FULGERATION;  Surgeon: Festus Aloe, MD;  Location: [facility];  Service: Urology;  Laterality: N/A;  ? CYSTOSCOPY WITH BIOPSY Bilateral 11/13/2014  ? Procedure: CYSTOSCOPY WITH  BLADDER BIOPSY FULGERATION AND BILATERAL RETROGRADE PYELOGRAMS;  Surgeon: Festus Aloe, MD;  Location: [facility];  Service: Urology;  Laterality: Bilateral;  ? [place] N/A 01/18/2018  ? Procedure: CYSTOSCOPY WITH FULGERATION/ BLADDER BIOPSY;  Surgeon: Festus Aloe, MD;  Location: [facility];  Service: Urology;  Laterality: N/A;  ? CYSTOSCOPY WITH INJECTION N/A 11/09/2018  ? Procedure: CYSTOSCOPY WITH INJECTION OF INDOCYANINE GREEN DYE;  Surgeon: Alexis Frock, MD;  Location: [facility];  Service: Urology;  Laterality: N/A;  ? CYSTOSCOPY WITH INSERTION OF UROLIFT N/A 01/18/2018  ? Procedure: CYSTOSCOPY WITH INSERTION OF UROLIFT;  Surgeon: Festus Aloe, MD;  Location: [facility];  Service: Urology;  Laterality: N/A;  ? CYSTOSCOPY/URETEROSCOPY/HOLMIUM LASER Left 02/17/2019  ? Procedure: URETEROSCOPY WITH BALLOON DILATION  AND NEPHROSTOGRAM;  Surgeon: Alexis Frock, MD;  Location: [facility];  Service: Urology;  Laterality: Left;  1 HR  ? EXCISION RIGHT UPPER ARM LIPOMA  2005  ? HEMORROIDECTOMY    ? INGUINAL HERNIA REPAIR Left 1984  ? IR NEPHROSTOMY PLACEMENT LEFT  01/05/2019  ? KNEE ARTHROSCOPY Left X3  LAST ONE  2002  ? ORIF LEFT HUMEROUS FX  1976  ? POLYPECTOMY  05/11/2018  ? Procedure: POLYPECTOMY;  Surgeon: Daneil Dolin, MD;  [facility];  Service: Endoscopy;;  ? PROSTATE SURGERY    ?  ROBOT ASSISTED LAPAROSCOPIC NEPHRECTOMY Left 07/14/2019  ? Procedure: XI ROBOTIC ASSISTED LAPAROSCOPIC RETROPERITONEAL NEPHRECTOMY;  Surgeon: Alexis Frock, MD;  Location: [facility];  Service: Urology;  Laterality: Left;  3 HRS  ? TONSILLECTOMY  AS CHILD  ? TOTAL KNEE ARTHROPLASTY Left 2006  ? REVISION 2007  (AFTER I & D WITH ANTIBIOTIC SPACER PROCEDURE FOR STEPH INFECTION)  ? TOTAL KNEE ARTHROPLASTY Right 03/30/2016  ? Procedure: RIGHT TOTAL KNEE ARTHROPLASTY;  Surgeon: Paralee Cancel, MD;  Location: [facility];  Service: Orthopedics;  Laterality: Right;  ? TOTAL SHOULDER ARTHROPLASTY Right 04/06/2013  ? Procedure: RIGHT TOTAL SHOULDER ARTHROPLASTY;  Surgeon: Marin Shutter, MD;  Location: [place];  Service: Orthopedics;  Laterality: Right;  ? TOTAL SHOULDER ARTHROPLASTY Left 07/20/2013  ? Procedure: LEFT TOTAL SHOULDER ARTHROPLASTY;  Surgeon: Marin Shutter, MD;  Location: [place];  Service: Orthopedics;  Laterality: Left;  ? TRANSURETHRAL RESECTION OF BLADDER TUMOR N/A 11/12/2015  ? Procedure: TRANSURETHRAL RESECTION OF BLADDER TUMOR (TURBT);  Surgeon:  Festus Aloe, MD;  Location: [facility];  Service: Urology;  Laterality: N/A;  ? TRANSURETHRAL RESECTION OF BLADDER TUMOR WITH GYRUS (TURBT-GYRUS) N/A 12/05/2013  ? Procedure: TRANSURETHRAL RESECTION OF BLADDER TUMOR WITH GYRUS (TURBT-GYRUS);  Surgeon: Festus Aloe, MD;  Location: [facility];  Service: Urology;  Laterality: N/A;  ? ?Family History  ?Problem Relation Age of Onset  ? Alzheimer's disease Mother   ? Heart attack Father   ? Heart disease Father   ? Irregular heart beat Brother   ?     DEFIB.  ? Congestive Heart Failure Brother   ? Diabetes Daughter   ? ?Social History  ? ?Socioeconomic History  ? Marital status: Married  ?  Spouse name: Vickii Chafe  ? Number of children: 2  ? Years of education: 15  ? Highest education level: Associate degree: occupational, Hotel manager, or vocational program  ?Occupational History  ? Occupation: retired   ?  Comment: [employer], 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   ?Tobacco Use  ? Smoking status: Former  ?  Packs/day: 2.00  ?  Years: 40.00  ?  Pack years: 80.00  ?  Types: Cigarettes  ?  Quit date: 06/01/1997  ?  Years since q

## 2021-08-22 DIAGNOSIS — B356 Tinea cruris: Secondary | ICD-10-CM | POA: Diagnosis not present

## 2021-08-22 DIAGNOSIS — J029 Acute pharyngitis, unspecified: Secondary | ICD-10-CM | POA: Diagnosis not present

## 2021-08-25 ENCOUNTER — Encounter: Payer: Self-pay | Admitting: Family Medicine

## 2021-08-25 ENCOUNTER — Ambulatory Visit (INDEPENDENT_AMBULATORY_CARE_PROVIDER_SITE_OTHER): Payer: Medicare Other | Admitting: Family Medicine

## 2021-08-25 VITALS — BP 122/70 | HR 80 | Temp 97.4°F | Ht 66.0 in | Wt 229.0 lb

## 2021-08-25 DIAGNOSIS — J40 Bronchitis, not specified as acute or chronic: Secondary | ICD-10-CM | POA: Diagnosis not present

## 2021-08-25 MED ORDER — AZITHROMYCIN 250 MG PO TABS: ORAL_TABLET | ORAL | 0 refills | Status: DC

## 2021-08-25 NOTE — Progress Notes (Signed)
? ?BP 122/70   Pulse 80   Temp (!) 97.4 ?F (36.3 ?C)   Ht '5\' 6"'$  (1.676 m)   Wt 229 lb (103.9 kg)   SpO2 98%   BMI 36.96 kg/m?   ? ?Subjective:  ? ?Patient ID: Billy Rasmussen, male    DOB: January 12, 1946, 76 y.o.   MRN: [id number] ? ?HPI: ?Billy Rasmussen is a 76 y.o. male presenting on 08/25/2021 for URI (Productive cough, nasal drainage, sore throat, a little wheezing) ? ? ?HPI ?Patient is coming in with cough and congestion and sore throat and wheezing.  He says this has been going on for about a week.  He did go to an urgent care and was given amoxicillin 3 days ago and started it and does not feel like it helped at all and usually he does azithromycin and that makes him feel better.  He says he is more worried about getting into his chest and usually azithromycin is what he takes to help with it.  He does have a history of smoking but quit 23 years ago. ? ?Relevant past medical, surgical, family and social history reviewed and updated as indicated. Interim medical history since our last visit reviewed. ?Allergies and medications reviewed and updated. ? ?Review of Systems  ?Constitutional:  Negative for chills and fever.  ?HENT:  Positive for congestion, postnasal drip, rhinorrhea and sore throat. Negative for ear discharge, ear pain, sinus pressure, sneezing and voice change.   ?Eyes:  Negative for pain, discharge, redness and visual disturbance.  ?Respiratory:  Positive for cough and wheezing. Negative for shortness of breath.   ?Cardiovascular:  Negative for chest pain and leg swelling.  ?Musculoskeletal:  Negative for gait problem.  ?Skin:  Negative for rash.  ?All other systems reviewed and are negative. ? ?Per HPI unless specifically indicated above ? ? ?Allergies as of 08/25/2021   ?No Known Allergies ?  ? ?  ?Medication List  ?  ? ?  ? Accurate as of August 25, 2021 11:08 AM. If you have any questions, ask your nurse or doctor.  ?  ?  ? ?  ? ?amoxicillin 500 MG capsule ?Commonly known as: AMOXIL ?Take 500  mg by mouth 2 (two) times daily. ?  ?azithromycin 250 MG tablet ?Commonly known as: ZITHROMAX ?Take 2 the first day and then one each day after. ?Started by: Worthy Rancher, MD ?  ?docusate sodium 100 MG capsule ?Commonly known as: COLACE ?Take 200 mg by mouth daily. ?  ?lisinopril 5 MG tablet ?Commonly known as: ZESTRIL ?Take 1 tablet (5 mg total) by mouth daily. (NEEDS TO BE SEEN BEFORE NEXT REFILL) ?  ?meclizine 25 MG tablet ?Commonly known as: ANTIVERT ?  ?Multivitamin Adult Tabs ?Take 1 tablet by mouth daily. ?  ?OVER THE COUNTER MEDICATION ?Take 1 tablet by mouth daily. ?  ?polyvinyl alcohol 1.4 % ophthalmic solution ?Commonly known as: LIQUIFILM TEARS ?Place 1 drop into both eyes as needed for dry eyes. ?  ? ?  ? ? ? ?Objective:  ? ?BP 122/70   Pulse 80   Temp (!) 97.4 ?F (36.3 ?C)   Ht '5\' 6"'$  (1.676 m)   Wt 229 lb (103.9 kg)   SpO2 98%   BMI 36.96 kg/m?   ?Wt Readings from Last 3 Encounters:  ?08/25/21 229 lb (103.9 kg)  ?08/12/21 240 lb (108.9 kg)  ?01/07/21 226 lb 3.2 oz (102.6 kg)  ?  ?Physical Exam ?Vitals and nursing note reviewed.  ?Constitutional:   ?  General: He is not in acute distress. ?   Appearance: He is well-developed. He is not diaphoretic.  ?HENT:  ?   Nose: Mucosal edema and rhinorrhea present.  ?   Right Sinus: Maxillary sinus tenderness present. No frontal sinus tenderness.  ?   Left Sinus: Maxillary sinus tenderness present. No frontal sinus tenderness.  ?   Mouth/Throat:  ?   Pharynx: Uvula midline. No oropharyngeal exudate or posterior oropharyngeal erythema.  ?   Tonsils: No tonsillar abscesses.  ?Eyes:  ?   General: No scleral icterus. ?   Conjunctiva/sclera: Conjunctivae normal.  ?Neck:  ?   Thyroid: No thyromegaly.  ?Cardiovascular:  ?   Rate and Rhythm: Normal rate and regular rhythm.  ?   Heart sounds: Normal heart sounds. No murmur heard. ?Pulmonary:  ?   Effort: Pulmonary effort is normal. No respiratory distress.  ?   Breath sounds: Rhonchi present. No wheezing or  rales.  ?Musculoskeletal:     ?   General: Normal range of motion.  ?   Cervical back: Neck supple.  ?Lymphadenopathy:  ?   Cervical: No cervical adenopathy.  ?Skin: ?   General: Skin is warm and dry.  ?   Findings: No rash.  ?Neurological:  ?   Mental Status: He is alert and oriented to person, place, and time.  ?   Coordination: Coordination normal.  ?Psychiatric:     ?   Behavior: Behavior normal.  ? ? ? ? ?Assessment & Plan:  ? ?Problem List Items Addressed This Visit   ?None ?Visit Diagnoses   ? ? Bronchitis    -  Primary  ? Relevant Medications  ? azithromycin (ZITHROMAX) 250 MG tablet  ? ?  ?  ?Will add azithromycin but recommend he still finish the amoxicillin due to resistance to azithromycin in the community.  Call back if worsens or does not improve. ?Follow up plan: ?Return if symptoms worsen or fail to improve. ? ?Counseling provided for all of the vaccine components ?No orders of the defined types were placed in this encounter. ? ? ?Caryl Pina, MD ?[place] ?08/25/2021, 11:08 AM ? ? ?  ?

## 2021-09-09 ENCOUNTER — Other Ambulatory Visit: Payer: Self-pay | Admitting: Family Medicine

## 2021-09-19 ENCOUNTER — Ambulatory Visit (INDEPENDENT_AMBULATORY_CARE_PROVIDER_SITE_OTHER): Payer: Medicare Other | Admitting: Family Medicine

## 2021-09-19 ENCOUNTER — Encounter: Payer: Self-pay | Admitting: Family Medicine

## 2021-09-19 VITALS — BP 137/85 | HR 83 | Temp 98.5°F | Ht 66.0 in | Wt 223.2 lb

## 2021-09-19 DIAGNOSIS — I10 Essential (primary) hypertension: Secondary | ICD-10-CM

## 2021-09-19 DIAGNOSIS — N1831 Chronic kidney disease, stage 3a: Secondary | ICD-10-CM | POA: Diagnosis not present

## 2021-09-19 DIAGNOSIS — F320 Major depressive disorder, single episode, mild: Secondary | ICD-10-CM | POA: Diagnosis not present

## 2021-09-19 DIAGNOSIS — Z23 Encounter for immunization: Secondary | ICD-10-CM

## 2021-09-19 NOTE — Patient Instructions (Signed)
You had labs performed today.  You will be contacted with the results of the labs once they are available, usually in the next 3 business days for routine lab work.  If you have an active my chart account, they will be released to your MyChart.  If you prefer to have these labs released to you via telephone, please let us know.     

## 2021-09-19 NOTE — Progress Notes (Signed)
? ?Subjective: ?CC: Chronic follow-up ?PCP: Janora Norlander, DO ?Billy Rasmussen is a 76 y.o. male presenting to clinic today for: ? ?1.  Hypertension w/ [id number] ?Patient is compliant with lisinopril 5 mg daily patient reports chest pain, shortness breath, dizziness or edema ? ?2.  Depression ?Patient reports good control of depression and is not currently being medicated. ? ? ?ROS: Per HPI ? ?No Known Allergies ?Past Medical History:  ?Diagnosis Date  ? Arthritis   ? knees, shoulders, elbows  ? Bladder cancer ([place])   ? urologist-  dr Junious Silk  ? Diverticulosis of colon   ? Fibromyalgia   ? GERD (gastroesophageal reflux disease)   ? History of diverticulitis of colon   ? History of gastric ulcer   ? due to aleve  ? Hypertension   ? Lower urinary tract symptoms (LUTS)   ? OSA (obstructive sleep apnea)   ? NON-COMPLIANT  CPAP  --- BUT PT USES OXYGEN AT NIGHT 2.5L VIA [place] (PT'S DECISION)  ? PONV (postoperative nausea and vomiting)   ? Psoriasis   ? Sepsis [facility])   ? january 2021  ? Tinnitus   ? right ear more, has tranmitter in right ear removable at hs  ? Wears glasses   ? Wears partial dentures   ? ? ?Current Outpatient Medications:  ?  amoxicillin (AMOXIL) 500 MG capsule, Take 500 mg by mouth 2 (two) times daily., Disp: , Rfl:  ?  docusate sodium (COLACE) 100 MG capsule, Take 200 mg by mouth daily., Disp: , Rfl:  ?  lisinopril (ZESTRIL) 5 MG tablet, TAKE ONE TABLET BY MOUTH EVERY DAY, Disp: 30 tablet, Rfl: 0 ?  meclizine (ANTIVERT) 25 MG tablet, , Disp: , Rfl:  ?  Multiple Vitamin (MULTIVITAMIN ADULT) TABS, Take 1 tablet by mouth daily., Disp: , Rfl:  ?  OVER THE COUNTER MEDICATION, Take 1 tablet by mouth daily. , Disp: , Rfl:  ?  polyvinyl alcohol (LIQUIFILM TEARS) 1.4 % ophthalmic solution, Place 1 drop into both eyes as needed for dry eyes., Disp: 15 mL, Rfl: 0 ?Social History  ? ?Socioeconomic History  ? Marital status: Married  ?  Spouse name: Vickii Chafe  ? Number of children: 2  ? Years of education: 47  ?  Highest education level: Associate degree: occupational, Hotel manager, or vocational program  ?Occupational History  ? Occupation: retired  ?  Comment: [employer], 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   ?Tobacco Use  ? Smoking status: Former  ?  Packs/day: 2.00  ?  Years: 40.00  ?  Pack years: 80.00  ?  Types: Cigarettes  ?  Quit date: 06/01/1997  ?  Years since quitting: 24.3  ? Smokeless tobacco: Never  ?Vaping Use  ? Vaping Use: Never used  ?Substance and Sexual Activity  ? Alcohol use: No  ? Drug use: No  ? Sexual activity: Not Currently  ?Other Topics Concern  ? Not on file  ?Social History Narrative  ? One level home with wife   ? 67/127 - 23 year old Farber lives with them  ? ?Social Determinants of Health  ? ?Financial Resource Strain: Low Risk   ? Difficulty of Paying Living Expenses: Not hard at all  ?Food Insecurity: No Food Insecurity  ? Worried About Charity fundraiser in the Last Year: Never true  ? Ran Out of Food in the Last Year: Never true  ?Transportation Needs: No Transportation Needs  ? Lack of Transportation (Medical): No  ? Lack of Transportation (Non-Medical): No  ?  Physical Activity: Insufficiently Active  ? Days of Exercise per Week: 7 days  ? Minutes of Exercise per Session: 20 min  ?Stress: No Stress Concern Present  ? Feeling of Stress : Not at all  ?Social Connections: Socially Integrated  ? Frequency of Communication with Friends and Family: More than three times a week  ? Frequency of Social Gatherings with Friends and Family: More than three times a week  ? Attends Religious Services: More than 4 times per year  ? Active Member of Clubs or Organizations: Yes  ? Attends Archivist Meetings: More than 4 times per year  ? Marital Status: Married  ?Intimate Partner Violence: Not At Risk  ? Fear of Current or Ex-Partner: No  ? Emotionally Abused: No  ? Physically Abused: No  ? Sexually Abused: No  ? ?Family History  ?Problem Relation Age of Onset  ? Alzheimer's disease Mother   ? Heart attack Father   ? Heart  disease Father   ? Irregular heart beat Brother   ?     DEFIB.  ? Congestive Heart Failure Brother   ? Diabetes Daughter   ? ? ?Objective: ?Office vital signs reviewed. ?BP 137/85   Pulse 83   Temp 98.5 ?F (36.9 ?C)   Ht $R'5\' 6"'uy$  (1.676 m)   Wt 223 lb 3.2 oz (101.2 kg)   SpO2 96%   BMI 36.03 kg/m?  ? ?Physical Examination:  ?General: Awake, alert, well nourished, No acute distress ?HEENT: Sclera white.  Moist mucous membranes ?Cardio: regular rate and rhythm, S1S2 heard, no murmurs appreciated ?Pulm: clear to auscultation bilaterally, no wheezes, rhonchi or rales; normal work of breathing on room air ?GI: Ostomy bag in place. ?Extremities: warm, well perfused, No edema, cyanosis or clubbing; +2 pulses bilaterally ?MSK: Ambulating independently.  Cell overall postsurgical scars noted along bilateral anterior knees and shoulders ? ?Assessment/ Plan: ?76 y.o. male  ? ?Primary hypertension - Plan: CMP14+EGFR, Lipid Panel ? ?Stage 3a chronic kidney disease ([place]) - Plan: CMP14+EGFR, CBC, VITAMIN D 25 Hydroxy (Vit-D Deficiency, Fractures) ? ?Depression, major, single episode, mild ([place]) - Plan: TSH ? ?Blood pressure controlled.  Continue current regimen.  Check fasting labs ? ?Depression under good control without medication ? ?No orders of the defined types were placed in this encounter. ? ?No orders of the defined types were placed in this encounter. ? ? ? ?Janora Norlander, DO ?[facility] ?[phone] ? ?

## 2021-09-20 ENCOUNTER — Encounter: Payer: Self-pay | Admitting: Oncology

## 2021-09-20 LAB — CMP14+EGFR
ALT: 25 IU/L (ref 0–44)
AST: 39 IU/L (ref 0–40)
Albumin/Globulin Ratio: 1.7 (ref 1.2–2.2)
Albumin: 4.7 g/dL (ref 3.7–4.7)
Alkaline Phosphatase: 61 IU/L (ref 44–121)
BUN/Creatinine Ratio: 13 (ref 10–24)
BUN: 19 mg/dL (ref 8–27)
Bilirubin Total: 0.6 mg/dL (ref 0.0–1.2)
CO2: 22 mmol/L (ref 20–29)
Calcium: 10 mg/dL (ref 8.6–10.2)
Chloride: 102 mmol/L (ref 96–106)
Creatinine, Ser: 1.45 mg/dL — ABNORMAL HIGH (ref 0.76–1.27)
Globulin, Total: 2.8 g/dL (ref 1.5–4.5)
Glucose: 112 mg/dL — ABNORMAL HIGH (ref 70–99)
Potassium: 4.9 mmol/L (ref 3.5–5.2)
Sodium: 138 mmol/L (ref 134–144)
Total Protein: 7.5 g/dL (ref 6.0–8.5)
eGFR: 51 mL/min/{1.73_m2} — ABNORMAL LOW (ref >59)

## 2021-09-20 LAB — LIPID PANEL
Chol/HDL Ratio: 6.2 ratio — ABNORMAL HIGH (ref 0.0–5.0)
Cholesterol, Total: 148 mg/dL (ref 100–199)
HDL: 24 mg/dL — ABNORMAL LOW (ref >39)
LDL Chol Calc (NIH): 89 mg/dL (ref 0–99)
Triglycerides: 204 mg/dL — ABNORMAL HIGH (ref 0–149)
VLDL Cholesterol Cal: 35 mg/dL (ref 5–40)

## 2021-09-20 LAB — CBC
Hematocrit: 43.6 % (ref 37.5–51.0)
Hemoglobin: 14.8 g/dL (ref 13.0–17.7)
MCH: 30.5 pg (ref 26.6–33.0)
MCHC: 33.9 g/dL (ref 31.5–35.7)
MCV: 90 fL (ref 79–97)
Platelets: 141 10*3/uL — ABNORMAL LOW (ref 150–450)
RBC: 4.86 x10E6/uL (ref 4.14–5.80)
RDW: 13.4 % (ref 11.6–15.4)
WBC: 5.9 10*3/uL (ref 3.4–10.8)

## 2021-09-20 LAB — TSH: TSH: 3.24 u[IU]/mL (ref 0.450–4.500)

## 2021-09-20 LAB — VITAMIN D 25 HYDROXY (VIT D DEFICIENCY, FRACTURES): Vit D, 25-Hydroxy: 30.1 ng/mL (ref 30.0–100.0)

## 2021-09-22 ENCOUNTER — Other Ambulatory Visit (HOSPITAL_COMMUNITY): Payer: Self-pay | Admitting: Urology

## 2021-09-22 DIAGNOSIS — C678 Malignant neoplasm of overlapping sites of bladder: Secondary | ICD-10-CM

## 2021-10-07 ENCOUNTER — Other Ambulatory Visit: Payer: Self-pay | Admitting: Family Medicine

## 2021-12-04 ENCOUNTER — Encounter: Payer: Medicare Other | Admitting: Family Medicine

## 2021-12-04 DIAGNOSIS — R509 Fever, unspecified: Secondary | ICD-10-CM | POA: Diagnosis not present

## 2021-12-04 DIAGNOSIS — J0100 Acute maxillary sinusitis, unspecified: Secondary | ICD-10-CM | POA: Diagnosis not present

## 2021-12-04 DIAGNOSIS — R5383 Other fatigue: Secondary | ICD-10-CM | POA: Diagnosis not present

## 2021-12-04 NOTE — Progress Notes (Signed)
Patient was seen at [facility] and would like to cancel his appt today.

## 2021-12-08 ENCOUNTER — Other Ambulatory Visit (HOSPITAL_COMMUNITY): Payer: Self-pay | Admitting: Urology

## 2021-12-08 ENCOUNTER — Ambulatory Visit (HOSPITAL_COMMUNITY)
Admission: RE | Admit: 2021-12-08 | Discharge: 2021-12-08 | Disposition: A | Discharge status: Home / Self Care | Payer: Medicare Other | Source: Ambulatory Visit | Attending: Urology | Admitting: Urology

## 2021-12-08 DIAGNOSIS — K439 Ventral hernia without obstruction or gangrene: Secondary | ICD-10-CM | POA: Diagnosis not present

## 2021-12-08 DIAGNOSIS — J9811 Atelectasis: Secondary | ICD-10-CM | POA: Diagnosis not present

## 2021-12-08 DIAGNOSIS — C678 Malignant neoplasm of overlapping sites of bladder: Secondary | ICD-10-CM | POA: Insufficient documentation

## 2021-12-08 DIAGNOSIS — K5730 Diverticulosis of large intestine without perforation or abscess without bleeding: Secondary | ICD-10-CM | POA: Diagnosis not present

## 2021-12-08 DIAGNOSIS — M47814 Spondylosis without myelopathy or radiculopathy, thoracic region: Secondary | ICD-10-CM | POA: Diagnosis not present

## 2021-12-08 DIAGNOSIS — K8020 Calculus of gallbladder without cholecystitis without obstruction: Secondary | ICD-10-CM | POA: Diagnosis not present

## 2021-12-08 DIAGNOSIS — M954 Acquired deformity of chest and rib: Secondary | ICD-10-CM | POA: Diagnosis not present

## 2021-12-08 DIAGNOSIS — Z8551 Personal history of malignant neoplasm of bladder: Secondary | ICD-10-CM | POA: Diagnosis not present

## 2021-12-15 DIAGNOSIS — Z936 Other artificial openings of urinary tract status: Secondary | ICD-10-CM | POA: Diagnosis not present

## 2021-12-15 DIAGNOSIS — C678 Malignant neoplasm of overlapping sites of bladder: Secondary | ICD-10-CM | POA: Diagnosis not present

## 2021-12-15 DIAGNOSIS — Z905 Acquired absence of kidney: Secondary | ICD-10-CM | POA: Diagnosis not present

## 2021-12-22 ENCOUNTER — Other Ambulatory Visit: Payer: Self-pay

## 2021-12-30 ENCOUNTER — Other Ambulatory Visit: Payer: Self-pay

## 2022-02-16 ENCOUNTER — Ambulatory Visit (INDEPENDENT_AMBULATORY_CARE_PROVIDER_SITE_OTHER): Payer: Medicare Other

## 2022-02-16 ENCOUNTER — Encounter: Payer: Self-pay | Admitting: Family Medicine

## 2022-02-16 ENCOUNTER — Ambulatory Visit (INDEPENDENT_AMBULATORY_CARE_PROVIDER_SITE_OTHER): Payer: Medicare Other | Admitting: Family Medicine

## 2022-02-16 VITALS — BP 130/76 | HR 83 | Temp 98.0°F | Ht 66.0 in | Wt 224.0 lb

## 2022-02-16 DIAGNOSIS — W19XXXA Unspecified fall, initial encounter: Secondary | ICD-10-CM

## 2022-02-16 DIAGNOSIS — Z23 Encounter for immunization: Secondary | ICD-10-CM

## 2022-02-16 DIAGNOSIS — M19012 Primary osteoarthritis, left shoulder: Secondary | ICD-10-CM | POA: Diagnosis not present

## 2022-02-16 DIAGNOSIS — M25512 Pain in left shoulder: Secondary | ICD-10-CM | POA: Diagnosis not present

## 2022-02-16 DIAGNOSIS — K4090 Unilateral inguinal hernia, without obstruction or gangrene, not specified as recurrent: Secondary | ICD-10-CM | POA: Diagnosis not present

## 2022-02-16 NOTE — Progress Notes (Signed)
BP 130/76   Pulse 83   Temp 98 F (36.7 C)   Ht '5\' 6"'$  (1.676 m)   Wt 224 lb (101.6 kg)   SpO2 94%   BMI 36.15 kg/m    Subjective:   Patient ID: Billy Rasmussen, male    DOB: Feb 14, 1946, 76 y.o.   MRN: [id number]  HPI: Billy Rasmussen is a 76 y.o. male presenting on 02/16/2022 for Inguinal Hernia and left shoulder pain   HPI Right inguinal hernia Patient comes in today with a right inguinal hernia that is noticed for 6 months.  He is able to push it in but just felt like it was little more gaseous or crunching sometimes when he puts it back in.  He does get a little sore when he stands on his feet for long periods of time but it is always able to go him and go back away.  He denies any bowel issues or constipation or blood in his stool.  Following left shoulder injury Patient is coming in for left shoulder injury that he had a shoulder replacement on in 2015 but just 3 days ago he was working on some stone carving in his garage and was trying to move a 400 pound stone and it rolled onto his left ankle causing him to fall onto the left side of his body and he thinks outstretched arm on that left shoulder.  He wanted to make sure that there was no fracture.  He says surprisingly his ankle and his leg are doing much better over the past couple days.  He has a simple abrasion on the inside of his left ankle but is able to move them around and walk on him and put pressure on it without any pain.  It is mainly his shoulder and especially any going above 90 degrees hurts.  Relevant past medical, surgical, family and social history reviewed and updated as indicated. Interim medical history since our last visit reviewed. Allergies and medications reviewed and updated.  Review of Systems  Constitutional:  Negative for chills and fever.  Eyes:  Negative for visual disturbance.  Respiratory:  Negative for shortness of breath and wheezing.   Cardiovascular:  Negative for chest pain and leg  swelling.  Gastrointestinal:  Negative for abdominal pain, anal bleeding and blood in stool.  Musculoskeletal:  Positive for arthralgias and myalgias. Negative for back pain and gait problem.  Skin:  Negative for rash.  Neurological:  Negative for dizziness and weakness.  All other systems reviewed and are negative.   Per HPI unless specifically indicated above   Allergies as of 02/16/2022   No Known Allergies      Medication List        Accurate as of February 16, 2022 10:37 AM. If you have any questions, ask your nurse or doctor.          STOP taking these medications    amoxicillin 500 MG capsule Commonly known as: AMOXIL Stopped by: Fransisca Kaufmann Keiondra Brookover, MD   docusate sodium 100 MG capsule Commonly known as: COLACE Stopped by: Fransisca Kaufmann Azra Abrell, MD       TAKE these medications    lisinopril 5 MG tablet Commonly known as: ZESTRIL Take 1 tablet (5 mg total) by mouth daily.   meclizine 25 MG tablet Commonly known as: ANTIVERT   Multivitamin Adult Tabs Take 1 tablet by mouth daily.   OVER THE COUNTER MEDICATION Take 1 tablet by mouth daily.   polyvinyl  alcohol 1.4 % ophthalmic solution Commonly known as: LIQUIFILM TEARS Place 1 drop into both eyes as needed for dry eyes.         Objective:   BP 130/76   Pulse 83   Temp 98 F (36.7 C)   Ht '5\' 6"'$  (1.676 m)   Wt 224 lb (101.6 kg)   SpO2 94%   BMI 36.15 kg/m   Wt Readings from Last 3 Encounters:  02/16/22 224 lb (101.6 kg)  09/19/21 223 lb 3.2 oz (101.2 kg)  08/25/21 229 lb (103.9 kg)    Physical Exam Vitals and nursing note reviewed.  Constitutional:      General: He is not in acute distress.    Appearance: He is well-developed. He is not diaphoretic.  Eyes:     General: No scleral icterus.    Conjunctiva/sclera: Conjunctivae normal.  Neck:     Thyroid: No thyromegaly.  Abdominal:     Hernia: A hernia is present. Hernia is present in the right inguinal area. There is no hernia in  the left inguinal area.  Musculoskeletal:        General: Normal range of motion.     Left shoulder: Tenderness and crepitus present. No bony tenderness. Normal range of motion. Normal strength.     Cervical back: Neck supple.  Lymphadenopathy:     Cervical: No cervical adenopathy.     Lower Body: No right inguinal adenopathy. No left inguinal adenopathy.  Skin:    General: Skin is warm and dry.     Findings: No rash.  Neurological:     Mental Status: He is alert and oriented to person, place, and time.     Coordination: Coordination normal.  Psychiatric:        Behavior: Behavior normal.    Left shoulder x-ray: No sign of acute bony abnormality, his implanted device looks to be in place, await final read from radiology   Assessment & Plan:   Problem List Items Addressed This Visit   None Visit Diagnoses     Fall, initial encounter    -  Primary   Relevant Orders   DG Shoulder Left   Acute pain of left shoulder       Relevant Orders   DG Shoulder Left   Hernia, inguinal, right           Hernia is reducible and nontender and with his medical history we discussed that it may not be in his best interest to do the surgery at this point and he agrees but just wanted to double check that it looked okay.  He is going to hold off on it but gave him the warning signs of when he would have to get it done. Follow up plan: Return if symptoms worsen or fail to improve.  Counseling provided for all of the vaccine components Orders Placed This Encounter  Procedures   DG Shoulder Left    Caryl Pina, MD [facility] 02/16/2022, 10:37 AM

## 2022-03-06 ENCOUNTER — Ambulatory Visit: Payer: Medicare Other | Admitting: Family Medicine

## 2022-03-16 DIAGNOSIS — Z96612 Presence of left artificial shoulder joint: Secondary | ICD-10-CM | POA: Diagnosis not present

## 2022-03-16 DIAGNOSIS — Z96611 Presence of right artificial shoulder joint: Secondary | ICD-10-CM | POA: Diagnosis not present

## 2022-03-16 DIAGNOSIS — Z471 Aftercare following joint replacement surgery: Secondary | ICD-10-CM | POA: Diagnosis not present

## 2022-03-19 ENCOUNTER — Other Ambulatory Visit: Payer: Self-pay

## 2022-04-01 DIAGNOSIS — R39198 Other difficulties with micturition: Secondary | ICD-10-CM | POA: Diagnosis not present

## 2022-04-01 DIAGNOSIS — R059 Cough, unspecified: Secondary | ICD-10-CM | POA: Diagnosis not present

## 2022-04-01 DIAGNOSIS — R509 Fever, unspecified: Secondary | ICD-10-CM | POA: Diagnosis not present

## 2022-04-01 DIAGNOSIS — N390 Urinary tract infection, site not specified: Secondary | ICD-10-CM | POA: Diagnosis not present

## 2022-04-02 ENCOUNTER — Other Ambulatory Visit: Payer: Self-pay

## 2022-04-02 ENCOUNTER — Emergency Department (HOSPITAL_COMMUNITY)
Admission: EM | Admit: 2022-04-02 | Discharge: 2022-04-03 | Disposition: A | Discharge status: Home / Self Care | Payer: Medicare Other | Attending: Emergency Medicine | Admitting: Emergency Medicine

## 2022-04-02 ENCOUNTER — Encounter (HOSPITAL_COMMUNITY): Payer: Self-pay

## 2022-04-02 DIAGNOSIS — N390 Urinary tract infection, site not specified: Secondary | ICD-10-CM | POA: Diagnosis not present

## 2022-04-02 DIAGNOSIS — R7989 Other specified abnormal findings of blood chemistry: Secondary | ICD-10-CM | POA: Insufficient documentation

## 2022-04-02 DIAGNOSIS — E871 Hypo-osmolality and hyponatremia: Secondary | ICD-10-CM

## 2022-04-02 DIAGNOSIS — Z20822 Contact with and (suspected) exposure to covid-19: Secondary | ICD-10-CM | POA: Insufficient documentation

## 2022-04-02 DIAGNOSIS — R319 Hematuria, unspecified: Secondary | ICD-10-CM

## 2022-04-02 DIAGNOSIS — D72829 Elevated white blood cell count, unspecified: Secondary | ICD-10-CM | POA: Insufficient documentation

## 2022-04-02 DIAGNOSIS — E860 Dehydration: Secondary | ICD-10-CM | POA: Diagnosis not present

## 2022-04-02 DIAGNOSIS — R509 Fever, unspecified: Secondary | ICD-10-CM

## 2022-04-02 LAB — URINALYSIS, ROUTINE W REFLEX MICROSCOPIC
Bilirubin Urine: NEGATIVE
Glucose, UA: NEGATIVE mg/dL
Ketones, ur: NEGATIVE mg/dL
Nitrite: NEGATIVE
Protein, ur: 30 mg/dL — AB
Specific Gravity, Urine: 1.017 (ref 1.005–1.030)
pH: 6 (ref 5.0–8.0)

## 2022-04-02 LAB — RESP PANEL BY RT-PCR (FLU A&B, COVID) ARPGX2
Influenza A by PCR: NEGATIVE
Influenza B by PCR: NEGATIVE
SARS Coronavirus 2 by RT PCR: NEGATIVE

## 2022-04-02 LAB — CBC WITH DIFFERENTIAL/PLATELET
Abs Immature Granulocytes: 0.08 10*3/uL — ABNORMAL HIGH (ref 0.00–0.07)
Basophils Absolute: 0 10*3/uL (ref 0.0–0.1)
Basophils Relative: 0 %
Eosinophils Absolute: 0 10*3/uL (ref 0.0–0.5)
Eosinophils Relative: 0 %
HCT: 44.9 % (ref 39.0–52.0)
Hemoglobin: 15.2 g/dL (ref 13.0–17.0)
Immature Granulocytes: 1 %
Lymphocytes Relative: 8 %
Lymphs Abs: 1 10*3/uL (ref 0.7–4.0)
MCH: 30.8 pg (ref 26.0–34.0)
MCHC: 33.9 g/dL (ref 30.0–36.0)
MCV: 91.1 fL (ref 80.0–100.0)
Monocytes Absolute: 1.3 10*3/uL — ABNORMAL HIGH (ref 0.1–1.0)
Monocytes Relative: 9 %
Neutro Abs: 11.2 10*3/uL — ABNORMAL HIGH (ref 1.7–7.7)
Neutrophils Relative %: 82 %
Platelets: 135 10*3/uL — ABNORMAL LOW (ref 150–400)
RBC: 4.93 MIL/uL (ref 4.22–5.81)
RDW: 13.8 % (ref 11.5–15.5)
WBC: 13.7 10*3/uL — ABNORMAL HIGH (ref 4.0–10.5)
nRBC: 0 % (ref 0.0–0.2)

## 2022-04-02 LAB — COMPREHENSIVE METABOLIC PANEL
ALT: 17 U/L (ref 0–44)
AST: 28 U/L (ref 15–41)
Albumin: 4.2 g/dL (ref 3.5–5.0)
Alkaline Phosphatase: 47 U/L (ref 38–126)
Anion gap: 10 (ref 5–15)
BUN: 27 mg/dL — ABNORMAL HIGH (ref 8–23)
CO2: 20 mmol/L — ABNORMAL LOW (ref 22–32)
Calcium: 9.6 mg/dL (ref 8.9–10.3)
Chloride: 99 mmol/L (ref 98–111)
Creatinine, Ser: 1.29 mg/dL — ABNORMAL HIGH (ref 0.61–1.24)
GFR, Estimated: 58 mL/min — ABNORMAL LOW (ref >60)
Glucose, Bld: 122 mg/dL — ABNORMAL HIGH (ref 70–99)
Potassium: 4.4 mmol/L (ref 3.5–5.1)
Sodium: 129 mmol/L — ABNORMAL LOW (ref 135–145)
Total Bilirubin: 1.6 mg/dL — ABNORMAL HIGH (ref 0.3–1.2)
Total Protein: 8.4 g/dL — ABNORMAL HIGH (ref 6.5–8.1)

## 2022-04-02 LAB — LACTIC ACID, PLASMA: Lactic Acid, Venous: 1.2 mmol/L (ref 0.5–1.9)

## 2022-04-02 MED ORDER — SODIUM CHLORIDE 0.9 % IV BOLUS
1000.0000 mL | Freq: Once | INTRAVENOUS | Status: AC
  Administered 2022-04-02: 1000 mL via INTRAVENOUS

## 2022-04-02 NOTE — ED Triage Notes (Signed)
Pt states he was seen yesterday at [facility] and diagnosed with UTI, sent home with a prescription for antibiotics. Pt states today he was running a temp of 102 and not feeling well so wanted to be seen in ED. Pt states he has 1 kidney. Pt has hx of bladder surgery, pt has urostomy bag in place.

## 2022-04-02 NOTE — ED Provider Triage Note (Signed)
Emergency Medicine Provider Triage Evaluation Note  Billy Rasmussen , a 76 y.o. male  was evaluated in triage.  Pt complains of uti. Report feeling bad x 3 days with fever, nausea, abd pain.  Seen yesterday for same, diagnosed with UTI and was prescribed levofloxacin.  Felt worse and concerns because he only have 1 kidney. Has suprapubic foley  Review of Systems  Positive: As above Negative: As above  Physical Exam  BP 125/80   Pulse 99   Temp 98.8 F (37.1 C) (Oral)   Resp 18   SpO2 99%  Gen:   Awake, no distress   Resp:  Normal effort  MSK:   Moves extremities without difficulty  Other:    Medical Decision Making  Medically screening exam initiated at 6:04 PM.  Appropriate orders placed.  WAYMAN HOARD was informed that the remainder of the evaluation will be completed by another provider, this initial triage assessment does not replace that evaluation, and the importance of remaining in the ED until their evaluation is complete.     Domenic Moras, PA-C 04/02/22 1805

## 2022-04-02 NOTE — Discharge Instructions (Addendum)
1.  Drink electrolyte solutions for hydration.  Your sodium has gotten somewhat low which probably is due to combination of dehydration and infection.  Take extra salt in your diet at this time.  Do not drink large amounts of water that does not have other electrolytes in it. 2.  At this time your fever and symptoms are suspected to be due to urinary tract infection.  Continue antibiotics as previously prescribed by your provider. 3.  You should have a recheck within the next couple of days to monitor your lab work and your response to treatment. 4.  Return to the emergency department immediately if you develop any confusion, your symptoms are not continuing to improve at home, you develop abdominal pain, vomiting or other concerning changes.

## 2022-04-02 NOTE — ED Provider Notes (Signed)
[facility] DEPT Provider Note   CSN: [id number] Arrival date & time: 04/02/22  1653     History  Chief Complaint  Patient presents with   Urinary Tract Infection    Billy Rasmussen is a 76 y.o. male.  HPI Patient started feeling unwell day before yesterday.  Symptoms started with general fatigue and malaise.  He reports he had a mild headache and body aches.  He reported he felt very weak generally.  Yesterday he developed a fever up to 102.  Patient was seen by his doctor and at that time evaluated with strep test, influenza testing, urinalysis, chest x-ray.  He was diagnosed with UTI and started on Levaquin.  Patient took his first dose of Levaquin today.  He reports he continued to feel extremely fatigued and very low energy.  He and his wife are concerned for persisting symptoms and the fact that the patient has 1 kidney and has a urostomy.    Home Medications Prior to Admission medications   Medication Sig Start Date End Date Taking? Authorizing Provider  lisinopril (ZESTRIL) 5 MG tablet Take 1 tablet (5 mg total) by mouth daily. 10/07/21   Janora Norlander, DO  meclizine (ANTIVERT) 25 MG tablet     [provider]  Multiple Vitamin (MULTIVITAMIN ADULT) TABS Take 1 tablet by mouth daily.    [provider]  OVER THE COUNTER MEDICATION Take 1 tablet by mouth daily.     [provider]  polyvinyl alcohol (LIQUIFILM TEARS) 1.4 % ophthalmic solution Place 1 drop into both eyes as needed for dry eyes. 05/16/19   Vicenta Dunning, MD      Allergies    Patient has no known allergies.    Review of Systems   Review of Systems  Physical Exam Updated Vital Signs BP (!) 158/87 (BP Location: Right Arm)   Pulse 94   Temp 98.7 F (37.1 C)   Resp 18   SpO2 90%  Physical Exam Constitutional:      Comments: Patient is alert and nontoxic.  Mental status clear.  No respiratory distress.  HENT:     Head: Normocephalic and  atraumatic.     Mouth/Throat:     Mouth: Mucous membranes are moist.     Pharynx: Oropharynx is clear.  Eyes:     Extraocular Movements: Extraocular movements intact.  Cardiovascular:     Rate and Rhythm: Normal rate and regular rhythm.  Pulmonary:     Effort: Pulmonary effort is normal.     Breath sounds: Normal breath sounds.  Abdominal:     Comments: Patient has urostomy with clear dark yellow urine in it.  It is soft and nontender.  No guarding.  No reproducible pain to abdominal palpation.  No CVA tenderness to percussion.  Musculoskeletal:        General: No swelling or tenderness. Normal range of motion.     Right lower leg: No edema.     Left lower leg: No edema.     Comments: No peripheral edema.  Calves are soft and nontender.  Skin:    General: Skin is warm and dry.  Neurological:     General: No focal deficit present.     Mental Status: He is oriented to person, place, and time.     Motor: No weakness.     Coordination: Coordination normal.  Psychiatric:        Mood and Affect: Mood normal.     ED  Results / Procedures / Treatments   Labs (all labs ordered are listed, but only abnormal results are displayed) Labs Reviewed  COMPREHENSIVE METABOLIC PANEL - Abnormal; Notable for the following components:      Result Value   Sodium 129 (*)    CO2 20 (*)    Glucose, Bld 122 (*)    BUN 27 (*)    Creatinine, Ser 1.29 (*)    Total Protein 8.4 (*)    Total Bilirubin 1.6 (*)    GFR, Estimated 58 (*)    All other components within normal limits  CBC WITH DIFFERENTIAL/PLATELET - Abnormal; Notable for the following components:   WBC 13.7 (*)    Platelets 135 (*)    Neutro Abs 11.2 (*)    Monocytes Absolute 1.3 (*)    Abs Immature Granulocytes 0.08 (*)    All other components within normal limits  URINALYSIS, ROUTINE W REFLEX MICROSCOPIC - Abnormal; Notable for the following components:   Hgb urine dipstick MODERATE (*)    Protein, ur 30 (*)    Leukocytes,Ua LARGE  (*)    Bacteria, UA RARE (*)    All other components within normal limits  RESP PANEL BY RT-PCR (FLU A&B, COVID) ARPGX2  URINE CULTURE  CULTURE, BLOOD (ROUTINE X 2)  CULTURE, BLOOD (ROUTINE X 2)  LACTIC ACID, PLASMA    EKG None  Radiology No results found.  Procedures Procedures    Medications Ordered in ED Medications  sodium chloride 0.9 % bolus 1,000 mL (0 mLs Intravenous Stopped 04/02/22 2348)    ED Course/ Medical Decision Making/ A&P                           Medical Decision Making  Patient presents as outlined with onset of febrile illness yesterday.  He already had a fairly extensive diagnostic evaluation yesterday and was started on Levaquin with diagnosis of UTI.  Patient symptoms also suspicious for viral syndrome with generalized constitutional symptoms and fever.  COVID testing added, lactic acid, CBC and metabolic panel evaluated.  Metabolic panel positive for mild hyponatremia at 129.  Renal function stable with creatinine at better than baseline at 1.29.  Mild elevation in BUN suggestive of mild dehydration.  No anion gap.  Leukocytosis at 13.7.  H&H stable.  COVID and influenza testing are negative.  Patient rehydrated with a liter of normal saline.  He reports feeling much improved after rehydration with normal saline.  At this time patient is nontoxic, he does not have hypotension, no fever in the emergency department, no hypoxia and normal lactic acid.  At this time he is not meeting sepsis criteria.  Blood cultures added.  These will be pending to rule out bacteremia but at this point I do feel patient is stable for continued home management.  He can take oral intake.  He was started on Levaquin which we will continue based on prior evaluation.  Patient was counseled on hyponatremia and avoiding free water and increasing electrolyte solutions and some sodium consumption.  They are counseled on close follow-up and strict return precautions.  Patient and his  wife voiced understanding.        Final Clinical Impression(s) / ED Diagnoses Final diagnoses:  Fever, unspecified fever cause  Urinary tract infection with hematuria, site unspecified  Dehydration  Hyponatremia    Rx / DC Orders ED Discharge Orders     None  Charlesetta Shanks, MD 04/02/22 2358

## 2022-04-03 ENCOUNTER — Other Ambulatory Visit: Payer: Self-pay | Admitting: Family Medicine

## 2022-04-03 LAB — URINE CULTURE

## 2022-04-08 LAB — CULTURE, BLOOD (ROUTINE X 2)
Culture: NO GROWTH
Culture: NO GROWTH
Special Requests: ADEQUATE

## 2022-04-21 ENCOUNTER — Ambulatory Visit (INDEPENDENT_AMBULATORY_CARE_PROVIDER_SITE_OTHER): Payer: Medicare Other | Admitting: *Deleted

## 2022-04-21 ENCOUNTER — Ambulatory Visit: Payer: Medicare Other | Admitting: Family Medicine

## 2022-04-21 DIAGNOSIS — Z23 Encounter for immunization: Secondary | ICD-10-CM

## 2022-04-30 ENCOUNTER — Encounter: Payer: Self-pay | Admitting: *Deleted

## 2022-05-14 DIAGNOSIS — M79671 Pain in right foot: Secondary | ICD-10-CM | POA: Diagnosis not present

## 2022-05-14 DIAGNOSIS — M7661 Achilles tendinitis, right leg: Secondary | ICD-10-CM | POA: Diagnosis not present

## 2022-07-02 ENCOUNTER — Other Ambulatory Visit: Payer: Self-pay | Admitting: Family Medicine

## 2022-07-06 ENCOUNTER — Encounter: Payer: Self-pay | Admitting: Oncology

## 2022-07-07 ENCOUNTER — Ambulatory Visit (INDEPENDENT_AMBULATORY_CARE_PROVIDER_SITE_OTHER): Payer: Medicare HMO | Admitting: Family Medicine

## 2022-07-07 ENCOUNTER — Encounter: Payer: Self-pay | Admitting: Family Medicine

## 2022-07-07 VITALS — BP 132/86 | HR 80 | Temp 97.5°F | Ht 66.0 in | Wt 219.6 lb

## 2022-07-07 DIAGNOSIS — I129 Hypertensive chronic kidney disease with stage 1 through stage 4 chronic kidney disease, or unspecified chronic kidney disease: Secondary | ICD-10-CM | POA: Diagnosis not present

## 2022-07-07 DIAGNOSIS — E7800 Pure hypercholesterolemia, unspecified: Secondary | ICD-10-CM

## 2022-07-07 DIAGNOSIS — R7309 Other abnormal glucose: Secondary | ICD-10-CM

## 2022-07-07 DIAGNOSIS — N1831 Chronic kidney disease, stage 3a: Secondary | ICD-10-CM

## 2022-07-07 DIAGNOSIS — I10 Essential (primary) hypertension: Secondary | ICD-10-CM

## 2022-07-07 LAB — BAYER DCA HB A1C WAIVED: HB A1C (BAYER DCA - WAIVED): 5.1 % (ref 4.8–5.6)

## 2022-07-07 LAB — LIPID PANEL

## 2022-07-07 NOTE — Patient Instructions (Signed)

## 2022-07-07 NOTE — Progress Notes (Signed)
Subjective:    Patient ID: Billy Rasmussen, male    DOB: 22-Oct-1945, 77 y.o.   MRN: [id number]  Chief Complaint:  Hypertension  HPI: Billy Rasmussen is a 77 y.o. male presenting on 07/07/2022 for Hypertension  Hypertension  CKD Stage 3a Patient continues to be compliant with his medications, including lisinopril. Does not report chest pain. Does report occasional feelings of shortness of breath at rest that resolve with exercise and distraction. Does not notice lower extremity swelling. Reports that he has not had any recent changes to diet or exercise. Does check blood pressures at home and notes that the range is 123-144/74-84.   1. Primary hypertension   2. Stage 3a chronic kidney disease ([place])   3. Pure hypercholesterolemia   4. Elevated glucose     Outpatient Encounter Medications as of 07/07/2022  Medication Sig   lisinopril (ZESTRIL) 5 MG tablet Take 1 tablet (5 mg total) by mouth daily. (NEEDS TO BE SEEN BEFORE NEXT REFILL)   meclizine (ANTIVERT) 25 MG tablet    Multiple Vitamin (MULTIVITAMIN ADULT) TABS Take 1 tablet by mouth daily.   OVER THE COUNTER MEDICATION Take 1 tablet by mouth daily.    polyvinyl alcohol (LIQUIFILM TEARS) 1.4 % ophthalmic solution Place 1 drop into both eyes as needed for dry eyes.   No facility-administered encounter medications on file as of 07/07/2022.   Social history: Relevant past medical, surgical, family and social history reviewed and updated as indicated. Interim medical history since our last visit reviewed. Allergies and medications reviewed and updated  Family History reviewed for pertinent findings.  Past Medical History:  Diagnosis Date   Arthritis    knees, shoulders, elbows   Bladder cancer [facility])    urologist-  dr eskridge   Diverticulosis of colon    Fibromyalgia    GERD (gastroesophageal reflux disease)    History of diverticulitis of colon    History of gastric ulcer    due to aleve   Hypertension    Lower urinary  tract symptoms (LUTS)    OSA (obstructive sleep apnea)    NON-COMPLIANT  CPAP  --- BUT PT USES OXYGEN AT NIGHT 2.5L VIA [place] (PT'S DECISION)   PONV (postoperative nausea and vomiting)    Psoriasis    Sepsis ([place])    january 2021   Tinnitus    right ear more, has tranmitter in right ear removable at hs   Wears glasses    Wears partial dentures     Past Surgical History:  Procedure Laterality Date   APPENDECTOMY     COLECTOMY W/ COLOSTOMY  1996   W/   APPENDECTOMY   COLONOSCOPY N/A 05/11/2018   Procedure: COLONOSCOPY;  Surgeon: Daneil Dolin, MD;  [facility];  Service: Endoscopy;  Laterality: N/A;  9:30   COLOSTOMY TAKEDOWN  1996   CYSTOSCOPY WITH BIOPSY N/A 12/05/2013   Procedure: CYSTO BLADDER BIOPSY AND FULGERATION;  Surgeon: Festus Aloe, MD;  Location: [facility];  Service: Urology;  Laterality: N/A;   CYSTOSCOPY WITH BIOPSY Bilateral 11/13/2014   Procedure: CYSTOSCOPY WITH  BLADDER BIOPSY FULGERATION AND BILATERAL RETROGRADE PYELOGRAMS;  Surgeon: Festus Aloe, MD;  Location: [facility];  Service: Urology;  Laterality: Bilateral;   CYSTOSCOPY WITH FULGERATION N/A 01/18/2018   Procedure: CYSTOSCOPY WITH FULGERATION/ BLADDER BIOPSY;  Surgeon: Festus Aloe, MD;  Location: [facility];  Service: Urology;  Laterality: N/A;   CYSTOSCOPY WITH INJECTION N/A 11/09/2018   Procedure:  CYSTOSCOPY WITH INJECTION OF INDOCYANINE GREEN DYE;  Surgeon: Alexis Frock, MD;  [facility];  Service: Urology;  Laterality: N/A;   CYSTOSCOPY WITH INSERTION OF UROLIFT N/A 01/18/2018   Procedure: CYSTOSCOPY WITH INSERTION OF UROLIFT;  Surgeon: Festus Aloe, MD;  Location: [facility];  Service: Urology;  Laterality: N/A;   CYSTOSCOPY/URETEROSCOPY/HOLMIUM LASER Left 02/17/2019   Procedure: URETEROSCOPY WITH BALLOON DILATION  AND NEPHROSTOGRAM;  Surgeon: Alexis Frock, MD;  Location: [facility];   Service: Urology;  Laterality: Left;  1 HR   EXCISION RIGHT UPPER ARM LIPOMA  2005   HEMORROIDECTOMY     INGUINAL HERNIA REPAIR Left 1984   IR NEPHROSTOMY PLACEMENT LEFT  01/05/2019   KNEE ARTHROSCOPY Left X3  LAST ONE  2002   ORIF LEFT HUMEROUS FX  1976   POLYPECTOMY  05/11/2018   Procedure: POLYPECTOMY;  Surgeon: Daneil Dolin, MD;  [facility];  Service: Endoscopy;;   PROSTATE SURGERY     ROBOT ASSISTED LAPAROSCOPIC NEPHRECTOMY Left 07/14/2019   Procedure: XI ROBOTIC ASSISTED LAPAROSCOPIC RETROPERITONEAL NEPHRECTOMY;  Surgeon: Alexis Frock, MD;  [facility];  Service: Urology;  Laterality: Left;  3 HRS   TONSILLECTOMY  AS CHILD   TOTAL KNEE ARTHROPLASTY Left 2006   REVISION 2007  (AFTER I & D WITH ANTIBIOTIC SPACER PROCEDURE FOR STEPH INFECTION)   TOTAL KNEE ARTHROPLASTY Right 03/30/2016   Procedure: RIGHT TOTAL KNEE ARTHROPLASTY;  Surgeon: Paralee Cancel, MD;  [facility];  Service: Orthopedics;  Laterality: Right;   TOTAL SHOULDER ARTHROPLASTY Right 04/06/2013   Procedure: RIGHT TOTAL SHOULDER ARTHROPLASTY;  Surgeon: Marin Shutter, MD;  Location: [facility];  Service: Orthopedics;  Laterality: Right;   TOTAL SHOULDER ARTHROPLASTY Left 07/20/2013   Procedure: LEFT TOTAL SHOULDER ARTHROPLASTY;  Surgeon: Marin Shutter, MD;  Location: [place] [facility];  Service: Orthopedics;  Laterality: Left;   TRANSURETHRAL RESECTION OF BLADDER TUMOR N/A 11/12/2015   Procedure: TRANSURETHRAL RESECTION OF BLADDER TUMOR (TURBT);  Surgeon: Festus Aloe, MD;  Location: [facility];  Service: Urology;  Laterality: N/A;   TRANSURETHRAL RESECTION OF BLADDER TUMOR WITH GYRUS (TURBT-GYRUS) N/A 12/05/2013   Procedure: TRANSURETHRAL RESECTION OF BLADDER TUMOR WITH GYRUS (TURBT-GYRUS);  Surgeon: Festus Aloe, MD;  Location: [facility];  Service: Urology;  Laterality: N/A;    Social History   Socioeconomic History   Marital status: Married    Spouse name: Peggy   Number  of children: 2   Years of education: 14   Highest education level: Futures trader degree: occupational, Hotel manager, or vocational program  Occupational History   Occupation: retired    Comment: [employer], 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   Tobacco Use   Smoking status: Former    Packs/day: 2.00    Years: 40.00    Total pack years: 80.00    Types: Cigarettes    Quit date: 06/01/1997    Years since quitting: 25.1   Smokeless tobacco: Never  Vaping Use   Vaping Use: Never used  Substance and Sexual Activity   Alcohol use: No   Drug use: No   Sexual activity: Not Currently  Other Topics Concern   Not on file  Social History Narrative   One level home with wife    42/[id number] - 66 year old [place] lives with them   Social Determinants of Health   Financial Resource Strain: [place]  (08/12/2021)   Overall Financial Resource Strain (CARDIA)    Difficulty of Paying Living Expenses: Not hard  at all  Food Insecurity: No Food Insecurity (08/12/2021)   Hunger Vital Sign    Worried About Running Out of Food in the Last Year: Never true    Ran Out of Food in the Last Year: Never true  Transportation Needs: No Transportation Needs (08/12/2021)   PRAPARE - Hydrologist (Medical): No    Lack of Transportation (Non-Medical): No  Physical Activity: Insufficiently Active (08/12/2021)   Exercise Vital Sign    Days of Exercise per Week: 7 days    Minutes of Exercise per Session: 20 min  Stress: No Stress Concern Present (08/12/2021)   [place]    Feeling of Stress : Not at all  Social Connections: [place] (08/12/2021)   Social Connection and Isolation Panel [NHANES]    Frequency of Communication with Friends and Family: More than three times a week    Frequency of Social Gatherings with Friends and Family: More than three times a week    Attends Religious Services: More than 4 times per year    Active Member of [employer] or  Organizations: Yes    Attends Archivist Meetings: More than 4 times per year    Marital Status: Married  Human resources officer Violence: Not At Risk (08/12/2021)   Humiliation, Afraid, Rape, and Kick questionnaire    Fear of Current or Ex-Partner: No    Emotionally Abused: No    Physically Abused: No    Sexually Abused: No    Allergies as of 07/07/2022   No Known Allergies      Medication List        Accurate as of July 07, 2022  9:19 AM. If you have any questions, ask your nurse or doctor.          lisinopril 5 MG tablet Commonly known as: ZESTRIL Take 1 tablet (5 mg total) by mouth daily. (NEEDS TO BE SEEN BEFORE NEXT REFILL)   meclizine 25 MG tablet Commonly known as: ANTIVERT   Multivitamin Adult Tabs Take 1 tablet by mouth daily.   OVER THE COUNTER MEDICATION Take 1 tablet by mouth daily.   polyvinyl alcohol 1.4 % ophthalmic solution Commonly known as: LIQUIFILM TEARS Place 1 drop into both eyes as needed for dry eyes.        No Known Allergies  Review of Systems  Constitutional:  Negative for activity change, appetite change, chills, diaphoresis, fatigue, fever and unexpected weight change.  HENT:  Negative for congestion, ear pain, facial swelling, sneezing and sore throat.   Respiratory:  Negative for cough, choking, chest tightness. Positive for shortness of breath.  Cardiovascular:  Negative for chest pain, palpitations and leg swelling.  Gastrointestinal:  Negative for blood in stool, constipation, diarrhea and nausea.  Endocrine: Negative for cold intolerance and heat intolerance.  Genitourinary:  Negative for difficulty urinating, dysuria, penile pain, testicular pain and urgency.  Musculoskeletal:  Negative for arthralgias and back pain.  Skin:  Negative for color change and pallor.  Neurological:  Negative for dizziness, numbness and headaches.      Objective:    BP 132/86   Pulse 80   Temp (!) 97.5 F (36.4 C) (Temporal)   Ht  '5\' 6"'$  (1.676 m)   Wt 219 lb 9.6 oz (99.6 kg)   SpO2 96%   BMI 35.44 kg/m    Wt Readings from Last 3 Encounters:  07/07/22 219 lb 9.6 oz (99.6 kg)  02/16/22 224  lb (101.6 kg)  09/19/21 223 lb 3.2 oz (101.2 kg)    Physical Examination:  General: Awake, alert, well nourished, No acute distress Cardio: regular rate and rhythm, S1S2 heard, no murmurs appreciated Pulm: clear to auscultation bilaterally, no wheezes, rhonchi or rales; normal work of breathing on room air Extremities: warm, well perfused, No edema, cyanosis or clubbing; +1 pulses bilaterally Skin: dry; intact; no rashes or lesions   Results for orders placed or performed during the hospital encounter of 04/02/22  Urine Culture   Specimen: Urine, Suprapubic  Result Value Ref Range   Specimen Description      URINE, SUPRAPUBIC Performed at [facility], [place] [address], [place], [place] [address]    Special Requests      NONE Performed at [facility], [place] [address], [place], [place] [address]    Culture MULTIPLE SPECIES PRESENT, SUGGEST RECOLLECTION (A)    Report Status 04/03/2022 FINAL   Resp Panel by RT-PCR (Flu A&B, Covid) Anterior Nasal Swab   Specimen: Anterior Nasal Swab  Result Value Ref Range   SARS Coronavirus 2 by RT PCR NEGATIVE NEGATIVE   Influenza A by PCR NEGATIVE NEGATIVE   Influenza B by PCR NEGATIVE NEGATIVE  Blood culture (routine x 2)   Specimen: BLOOD LEFT HAND  Result Value Ref Range   Specimen Description      BLOOD LEFT HAND Performed at [facility], [address], [place], [place] [address]    Special Requests      BOTTLES DRAWN AEROBIC AND ANAEROBIC Blood Culture adequate volume Performed at [place] [address], [place], [place] [address]    Culture      NO GROWTH 5 DAYS Performed at [facility], [place] [address] [place], [place] [address]    Report Status 04/08/2022 FINAL   Blood culture (routine x  2)   Specimen: BLOOD  Result Value Ref Range   Specimen Description      BLOOD LEFT ANTECUBITAL Performed at [place] [address] [place], [place] [address]    Special Requests      BOTTLES DRAWN AEROBIC AND ANAEROBIC Blood Culture results may not be optimal due to an excessive volume of blood received in culture bottles Performed at [facility] [address] [place], [place] [address]    Culture      NO GROWTH 5 DAYS Performed at [facility], [place] [address] [place], [place] [address]    Report Status 04/08/2022 FINAL   Lactic acid, plasma  Result Value Ref Range   Lactic Acid, Venous 1.2 0.5 - 1.9 mmol/L  Comprehensive metabolic panel  Result Value Ref Range   Sodium 129 (L) 135 - 145 mmol/L   Potassium 4.4 3.5 - 5.1 mmol/L   Chloride 99 98 - 111 mmol/L   CO2 20 (L) 22 - 32 mmol/L   Glucose, Bld 122 (H) 70 - 99 mg/dL   BUN 27 (H) 8 - 23 mg/dL   Creatinine, Ser 1.29 (H) 0.61 - 1.24 mg/dL   Calcium 9.6 8.9 - 10.3 mg/dL   Total Protein 8.4 (H) 6.5 - 8.1 g/dL   Albumin 4.2 3.5 - 5.0 g/dL   AST 28 15 - 41 U/L   ALT 17 0 - 44 U/L   Alkaline Phosphatase 47 38 - 126 U/L   Total Bilirubin 1.6 (H) 0.3 - 1.2 mg/dL   GFR, Estimated 58 (L) >60  mL/min   Anion gap 10 5 - 15  CBC with Differential  Result Value Ref Range   WBC 13.7 (H) 4.0 - 10.5 K/uL   RBC 4.93 4.22 - 5.81 MIL/uL   Hemoglobin 15.2 13.0 - 17.0 g/dL   HCT 44.9 39.0 - 52.0 %   MCV 91.1 80.0 - 100.0 fL   MCH 30.8 26.0 - 34.0 pg   MCHC 33.9 30.0 - 36.0 g/dL   RDW 13.8 11.5 - 15.5 %   Platelets 135 (L) 150 - 400 K/uL   nRBC 0.0 0.0 - 0.2 %   Neutrophils Relative % 82 %   Neutro Abs 11.2 (H) 1.7 - 7.7 K/uL   Lymphocytes Relative 8 %   Lymphs Abs 1.0 0.7 - 4.0 K/uL   Monocytes Relative 9 %   Monocytes Absolute 1.3 (H) 0.1 - 1.0 K/uL   Eosinophils Relative 0 %   Eosinophils Absolute 0.0 0.0 - 0.5 K/uL   Basophils Relative 0 %   Basophils Absolute 0.0 0.0 -  0.1 K/uL   Immature Granulocytes 1 %   Abs Immature Granulocytes 0.08 (H) 0.00 - 0.07 K/uL  Urinalysis, Routine w reflex microscopic Urine, Catheterized  Result Value Ref Range   Color, Urine YELLOW YELLOW   APPearance CLEAR CLEAR   Specific Gravity, Urine 1.017 1.005 - 1.030   pH 6.0 5.0 - 8.0   Glucose, UA NEGATIVE NEGATIVE mg/dL   Hgb urine dipstick MODERATE (A) NEGATIVE   Bilirubin Urine NEGATIVE NEGATIVE   Ketones, ur NEGATIVE NEGATIVE mg/dL   Protein, ur 30 (A) NEGATIVE mg/dL   Nitrite NEGATIVE NEGATIVE   Leukocytes,Ua LARGE (A) NEGATIVE   RBC / HPF 6-10 0 - 5 RBC/hpf   WBC, UA 6-10 0 - 5 WBC/hpf   Bacteria, UA RARE (A) NONE SEEN   Squamous Epithelial / HPF 0-5 0 - 5   Mucus PRESENT       Assessment & Plan:  AYAZ SONDGEROTH comes in today with chief complaint of Hypertension and Shortness of Breath (X 13 day - patient states when he stated checking his BP. )  Patient's blood pressure is well-controlled at this time. Provide patient education about DASH diet as well as blood pressure ranges appropriate for his age. No need for medication changes at this time.   Patient's description of shortness of breath is unlikely to be related to cardiac pathologies given no lower extremity swelling, no positional changes, and given that his shortness of breath is improved with activity. Most likely psychologic or GERD related, as patient reports eating large amounts of hot sauce. Patient's blood pressure readings have been scanned into his chart. Will check labs today, including A1c, CMP with GFR, and lipid panel. Patient should follow-up in two to three months with PCP.    Follow up plan: Return in 3 months (on 10/05/2022), or if symptoms worsen or fail to improve, for HTN.  Labs pending Health Maintenance reviewed Diet and exercise encouraged  Educational handout given for DASH diet and blood pressure control.   The above assessment and management plan was discussed with the  patient. The patient verbalized understanding of and has agreed to the management plan. Patient is aware to call the clinic if symptoms persist or worsen. Patient is aware when to return to the clinic for a follow-up visit. Patient educated on when it is appropriate to go to the emergency department.   Stephani Police, MS3

## 2022-07-08 ENCOUNTER — Encounter: Payer: Self-pay | Admitting: Oncology

## 2022-07-08 LAB — CMP14+EGFR
ALT: 22 IU/L (ref 0–44)
AST: 43 IU/L — ABNORMAL HIGH (ref 0–40)
Albumin/Globulin Ratio: 1.7 (ref 1.2–2.2)
Albumin: 4.8 g/dL (ref 3.8–4.8)
Alkaline Phosphatase: 62 IU/L (ref 44–121)
BUN/Creatinine Ratio: 14 (ref 10–24)
BUN: 21 mg/dL (ref 8–27)
Bilirubin Total: 0.6 mg/dL (ref 0.0–1.2)
CO2: 22 mmol/L (ref 20–29)
Calcium: 10.1 mg/dL (ref 8.6–10.2)
Chloride: 100 mmol/L (ref 96–106)
Creatinine, Ser: 1.5 mg/dL — ABNORMAL HIGH (ref 0.76–1.27)
Globulin, Total: 2.8 g/dL (ref 1.5–4.5)
Glucose: 86 mg/dL (ref 70–99)
Potassium: 4.8 mmol/L (ref 3.5–5.2)
Sodium: 138 mmol/L (ref 134–144)
Total Protein: 7.6 g/dL (ref 6.0–8.5)
eGFR: 49 mL/min/{1.73_m2} — ABNORMAL LOW (ref >59)

## 2022-07-08 LAB — LIPID PANEL
Chol/HDL Ratio: 5.4 ratio — ABNORMAL HIGH (ref 0.0–5.0)
Cholesterol, Total: 152 mg/dL (ref 100–199)
HDL: 28 mg/dL — ABNORMAL LOW (ref >39)
LDL Chol Calc (NIH): 94 mg/dL (ref 0–99)
Triglycerides: 171 mg/dL — ABNORMAL HIGH (ref 0–149)
VLDL Cholesterol Cal: 30 mg/dL (ref 5–40)

## 2022-07-08 NOTE — Progress Notes (Signed)
Patient r/c - please call back

## 2022-07-09 ENCOUNTER — Other Ambulatory Visit: Payer: Self-pay | Admitting: *Deleted

## 2022-07-09 DIAGNOSIS — N289 Disorder of kidney and ureter, unspecified: Secondary | ICD-10-CM

## 2022-07-22 ENCOUNTER — Other Ambulatory Visit: Payer: Medicare HMO

## 2022-07-22 DIAGNOSIS — N289 Disorder of kidney and ureter, unspecified: Secondary | ICD-10-CM

## 2022-07-22 LAB — CMP14+EGFR
ALT: 22 IU/L (ref 0–44)
AST: 45 IU/L — ABNORMAL HIGH (ref 0–40)
Albumin/Globulin Ratio: 1.9 (ref 1.2–2.2)
Albumin: 4.8 g/dL (ref 3.8–4.8)
Alkaline Phosphatase: 59 IU/L (ref 44–121)
BUN/Creatinine Ratio: 13 (ref 10–24)
BUN: 19 mg/dL (ref 8–27)
Bilirubin Total: 0.7 mg/dL (ref 0.0–1.2)
CO2: 26 mmol/L (ref 20–29)
Calcium: 10.1 mg/dL (ref 8.6–10.2)
Chloride: 100 mmol/L (ref 96–106)
Creatinine, Ser: 1.46 mg/dL — ABNORMAL HIGH (ref 0.76–1.27)
Globulin, Total: 2.5 g/dL (ref 1.5–4.5)
Glucose: 91 mg/dL (ref 70–99)
Potassium: 4.9 mmol/L (ref 3.5–5.2)
Sodium: 138 mmol/L (ref 134–144)
Total Protein: 7.3 g/dL (ref 6.0–8.5)
eGFR: 50 mL/min/{1.73_m2} — ABNORMAL LOW (ref >59)

## 2022-07-27 ENCOUNTER — Telehealth: Payer: Self-pay | Admitting: Family Medicine

## 2022-07-30 ENCOUNTER — Ambulatory Visit (INDEPENDENT_AMBULATORY_CARE_PROVIDER_SITE_OTHER): Payer: Medicare HMO | Admitting: Family Medicine

## 2022-07-30 ENCOUNTER — Ambulatory Visit (INDEPENDENT_AMBULATORY_CARE_PROVIDER_SITE_OTHER): Payer: Medicare HMO

## 2022-07-30 ENCOUNTER — Other Ambulatory Visit: Payer: Self-pay | Admitting: Family Medicine

## 2022-07-30 ENCOUNTER — Encounter: Payer: Self-pay | Admitting: Family Medicine

## 2022-07-30 VITALS — BP 132/71 | HR 79 | Temp 98.3°F | Ht 66.0 in | Wt 220.0 lb

## 2022-07-30 DIAGNOSIS — J0140 Acute pansinusitis, unspecified: Secondary | ICD-10-CM

## 2022-07-30 DIAGNOSIS — R059 Cough, unspecified: Secondary | ICD-10-CM | POA: Diagnosis not present

## 2022-07-30 DIAGNOSIS — R051 Acute cough: Secondary | ICD-10-CM

## 2022-07-30 DIAGNOSIS — R0602 Shortness of breath: Secondary | ICD-10-CM | POA: Diagnosis not present

## 2022-07-30 MED ORDER — DOXYCYCLINE HYCLATE 100 MG PO TABS: 100.0000 mg | ORAL_TABLET | Freq: Two times a day (BID) | ORAL | 0 refills | Status: AC

## 2022-07-30 MED ORDER — FLUTICASONE PROPIONATE 50 MCG/ACT NA SUSP: 2.0000 | Freq: Every day | NASAL | 6 refills | Status: DC

## 2022-07-30 NOTE — Patient Instructions (Signed)

## 2022-07-30 NOTE — Progress Notes (Signed)
Acute Office Visit  Subjective:     Patient ID: Billy Rasmussen, male    DOB: [birth year], 77 y.o.   MRN: [id number]  Chief Complaint  Patient presents with   Shortness of Breath    Shortness of Breath This is a new problem. Episode onset: 4 weeks ago. The problem occurs daily. The problem has been unchanged. Associated symptoms include rhinorrhea, sputum production (yellow-green) and wheezing. Pertinent negatives include no abdominal pain, chest pain, claudication, ear pain, fever, headaches, hemoptysis, leg pain, leg swelling, neck pain, orthopnea, sore throat, swollen glands, syncope or vomiting. The symptoms are aggravated by eating (bending over, getting up quickly, or twisting). Associated symptoms comments: Sinus pressure, postnasal drip. He has tried nothing for the symptoms. His past medical history is significant for chronic lung disease. There is no history of asthma, COPD, DVT, a heart failure, PE or pneumonia.    Review of Systems  Constitutional:  Negative for fever.  HENT:  Positive for rhinorrhea. Negative for ear pain and sore throat.   Respiratory:  Positive for sputum production (yellow-green), shortness of breath and wheezing. Negative for hemoptysis.   Cardiovascular:  Negative for chest pain, orthopnea, claudication, leg swelling and syncope.  Gastrointestinal:  Negative for abdominal pain and vomiting.  Musculoskeletal:  Negative for neck pain.  Neurological:  Negative for headaches.        Objective:    BP 132/71   Pulse 79   Temp 98.3 F (36.8 C) (Temporal)   Ht '5\' 6"'$  (1.676 m)   Wt 220 lb (99.8 kg)   SpO2 97%   BMI 35.51 kg/m    Physical Exam Vitals and nursing note reviewed.  Constitutional:      General: He is not in acute distress.    Appearance: He is obese. He is not ill-appearing, toxic-appearing or diaphoretic.  HENT:     Right Ear: Tympanic membrane, ear canal and external ear normal.     Left Ear: Tympanic membrane, ear canal and  external ear normal.     Nose: Congestion present.     Right Sinus: Maxillary sinus tenderness and frontal sinus tenderness present.     Left Sinus: Maxillary sinus tenderness and frontal sinus tenderness present.     Mouth/Throat:     Mouth: Mucous membranes are moist.     Pharynx: Oropharynx is clear. No oropharyngeal exudate or posterior oropharyngeal erythema.  Cardiovascular:     Rate and Rhythm: Normal rate and regular rhythm.     Heart sounds: Normal heart sounds. No murmur heard. Pulmonary:     Effort: Pulmonary effort is normal. No respiratory distress.     Breath sounds: Normal breath sounds. No wheezing, rhonchi or rales.  Abdominal:     General: Bowel sounds are normal. There is no distension.     Palpations: Abdomen is soft.     Tenderness: There is no abdominal tenderness.  Musculoskeletal:     Cervical back: Neck supple. No rigidity.  Skin:    General: Skin is warm and dry.  Neurological:     General: No focal deficit present.     Mental Status: He is alert and oriented to person, place, and time.  Psychiatric:        Mood and Affect: Mood normal.        Behavior: Behavior normal.     No results found for any visits on 07/30/22.      Assessment & Plan:   Tylin was seen today for shortness of  breath.  Diagnoses and all orders for this visit:  Shortness of breath CXR negative for acute findings- agree with radiology read. Discussed shortness of breath with bending likely due to obesity.  -     DG Chest 2 View; Future  Acute non-recurrent pansinusitis Doxycyline and flonase as below. Discussed symptomatic care and return precautions.  -     fluticasone (FLONASE) 50 MCG/ACT nasal spray; Place 2 sprays into both nostrils daily. -     doxycycline (VIBRA-TABS) 100 MG tablet; Take 1 tablet (100 mg total) by mouth 2 (two) times daily for 7 days.  Return if symptoms worsen or fail to improve.  The patient indicates understanding of these issues and agrees  with the plan.  Gwenlyn Perking, [name]

## 2022-08-05 ENCOUNTER — Other Ambulatory Visit: Payer: Self-pay

## 2022-08-07 NOTE — Telephone Encounter (Signed)
Attempted to call pt , no answer left vm  

## 2022-08-07 NOTE — Telephone Encounter (Signed)
I assume they mean NEPHrologist?  For the impaired renal function?  He can but at [facility], it's early and he doesn't have to. I typically refer once the GFR <45

## 2022-08-11 ENCOUNTER — Telehealth: Payer: Self-pay | Admitting: Family Medicine

## 2022-08-11 DIAGNOSIS — G4733 Obstructive sleep apnea (adult) (pediatric): Secondary | ICD-10-CM

## 2022-08-11 DIAGNOSIS — G4734 Idiopathic sleep related nonobstructive alveolar hypoventilation: Secondary | ICD-10-CM

## 2022-08-11 NOTE — Telephone Encounter (Signed)
Pt changed ins to [facility] his oxygen now has to go to [facility] instead of [facility]. Needs new order for nighly 2L, may need more than just order though, will start by writing and sending order first.

## 2022-08-14 ENCOUNTER — Ambulatory Visit: Payer: Medicare HMO

## 2022-08-14 VITALS — Ht 66.0 in | Wt 220.0 lb

## 2022-08-14 DIAGNOSIS — Z0000 Encounter for general adult medical examination without abnormal findings: Secondary | ICD-10-CM | POA: Diagnosis not present

## 2022-08-14 NOTE — Progress Notes (Signed)
Subjective:   Billy Rasmussen is a 77 y.o. male who presents for Medicare Annual/Subsequent preventive examination. I connected with  Billy Rasmussen on 08/14/22 by a audio enabled telemedicine application and verified that I am speaking with the correct person using two identifiers.  Patient Location: Home  Provider Location: Home Office  I discussed the limitations of evaluation and management by telemedicine. The patient expressed understanding and agreed to proceed.  Review of Systems     Cardiac Risk Factors include: advanced age (>34men, >40 women);hypertension;male gender     Objective:    Today's Vitals   08/14/22 0916  Weight: 220 lb (99.8 kg)  Height: 5\' 6"  (1.676 m)   Body mass index is 35.51 kg/m.     08/14/2022    9:20 AM 04/02/2022    5:22 PM 08/12/2021    9:12 AM 08/06/2020    9:02 AM 03/21/2020   10:00 PM 12/28/2019    5:16 AM 12/28/2019    5:00 AM  Advanced Directives  Does Patient Have a Medical Advance Directive? Yes No Yes No No No No  Type of Paramedic of [place];Living will  [place];Living will      Copy of [place] in Chart? No - copy requested  No - copy requested      Would patient like information on creating a medical advance directive?  No - Patient declined  No - Patient declined Yes (Inpatient - patient requests chaplain consult to create a medical advance directive) No - Patient declined     Current Medications (verified) Outpatient Encounter Medications as of 08/14/2022  Medication Sig   fluticasone (FLONASE) 50 MCG/ACT nasal spray Place 2 sprays into both nostrils daily.   lisinopril (ZESTRIL) 5 MG tablet Take 1 tablet (5 mg total) by mouth daily.   meclizine (ANTIVERT) 25 MG tablet    Multiple Vitamin (MULTIVITAMIN ADULT) TABS Take 1 tablet by mouth daily.   polyvinyl alcohol (LIQUIFILM TEARS) 1.4 % ophthalmic solution Place 1 drop into both eyes as needed for dry eyes.    No facility-administered encounter medications on file as of 08/14/2022.    Allergies (verified) Patient has no known allergies.   History: Past Medical History:  Diagnosis Date   Arthritis    knees, shoulders, elbows   Bladder cancer [facility])    urologist-  dr eskridge   Diverticulosis of colon    Fibromyalgia    GERD (gastroesophageal reflux disease)    History of diverticulitis of colon    History of gastric ulcer    due to aleve   Hypertension    Lower urinary tract symptoms (LUTS)    OSA (obstructive sleep apnea)    NON-COMPLIANT  CPAP  --- BUT PT USES OXYGEN AT NIGHT 2.5L VIA [place] (PT'S DECISION)   PONV (postoperative nausea and vomiting)    Psoriasis    Sepsis ([place])    january 2021   Tinnitus    right ear more, has tranmitter in right ear removable at hs   Wears glasses    Wears partial dentures    Past Surgical History:  Procedure Laterality Date   APPENDECTOMY     COLECTOMY W/ COLOSTOMY  1996   W/   APPENDECTOMY   COLONOSCOPY N/A 05/11/2018   Procedure: COLONOSCOPY;  Surgeon: Daneil Dolin, MD;  [facility];  Service: Endoscopy;  Laterality: N/A;  9:30   COLOSTOMY TAKEDOWN  1996   CYSTOSCOPY WITH BIOPSY  N/A 12/05/2013   Procedure: CYSTO BLADDER BIOPSY AND FULGERATION;  Surgeon: Festus Aloe, MD;  Location: [facility];  Service: Urology;  Laterality: N/A;   CYSTOSCOPY WITH BIOPSY Bilateral 11/13/2014   Procedure: CYSTOSCOPY WITH  BLADDER BIOPSY FULGERATION AND BILATERAL RETROGRADE PYELOGRAMS;  Surgeon: Festus Aloe, MD;  Location: [facility];  Service: Urology;  Laterality: Bilateral;   CYSTOSCOPY WITH FULGERATION N/A 01/18/2018   Procedure: CYSTOSCOPY WITH FULGERATION/ BLADDER BIOPSY;  Surgeon: Festus Aloe, MD;  Location: [facility];  Service: Urology;  Laterality: N/A;   CYSTOSCOPY WITH INJECTION N/A 11/09/2018   Procedure: CYSTOSCOPY WITH INJECTION OF INDOCYANINE GREEN DYE;  Surgeon: Alexis Frock, MD;  [facility];  Service: Urology;  Laterality: N/A;   CYSTOSCOPY WITH INSERTION OF UROLIFT N/A 01/18/2018   Procedure: CYSTOSCOPY WITH INSERTION OF UROLIFT;  Surgeon: Festus Aloe, MD;  Location: [facility];  Service: Urology;  Laterality: N/A;   CYSTOSCOPY/URETEROSCOPY/HOLMIUM LASER Left 02/17/2019   Procedure: URETEROSCOPY WITH BALLOON DILATION  AND NEPHROSTOGRAM;  Surgeon: Alexis Frock, MD;  Location: [facility];  Service: Urology;  Laterality: Left;  1 HR   EXCISION RIGHT UPPER ARM LIPOMA  2005   HEMORROIDECTOMY     INGUINAL HERNIA REPAIR Left 1984   IR NEPHROSTOMY PLACEMENT LEFT  01/05/2019   KNEE ARTHROSCOPY Left X3  LAST ONE  2002   ORIF LEFT HUMEROUS FX  1976   POLYPECTOMY  05/11/2018   Procedure: POLYPECTOMY;  Surgeon: Daneil Dolin, MD;  [facility];  Service: Endoscopy;;   PROSTATE SURGERY     ROBOT ASSISTED LAPAROSCOPIC NEPHRECTOMY Left 07/14/2019   Procedure: XI ROBOTIC ASSISTED LAPAROSCOPIC RETROPERITONEAL NEPHRECTOMY;  Surgeon: Alexis Frock, MD;  [facility];  Service: Urology;  Laterality: Left;  3 HRS   TONSILLECTOMY  AS CHILD   TOTAL KNEE ARTHROPLASTY Left 2006   REVISION 2007  (AFTER I & D WITH ANTIBIOTIC SPACER PROCEDURE FOR STEPH INFECTION)   TOTAL KNEE ARTHROPLASTY Right 03/30/2016   Procedure: RIGHT TOTAL KNEE ARTHROPLASTY;  Surgeon: Paralee Cancel, MD;  [facility];  Service: Orthopedics;  Laterality: Right;   TOTAL SHOULDER ARTHROPLASTY Right 04/06/2013   Procedure: RIGHT TOTAL SHOULDER ARTHROPLASTY;  Surgeon: Marin Shutter, MD;  Location: [place];  Service: Orthopedics;  Laterality: Right;   TOTAL SHOULDER ARTHROPLASTY Left 07/20/2013   Procedure: LEFT TOTAL SHOULDER ARTHROPLASTY;  Surgeon: Marin Shutter, MD;  Location: [place];  Service: Orthopedics;  Laterality: Left;   TRANSURETHRAL RESECTION OF BLADDER TUMOR N/A 11/12/2015   Procedure: TRANSURETHRAL RESECTION OF BLADDER TUMOR (TURBT);   Surgeon: Festus Aloe, MD;  [facility];  Service: Urology;  Laterality: N/A;   TRANSURETHRAL RESECTION OF BLADDER TUMOR WITH GYRUS (TURBT-GYRUS) N/A 12/05/2013   Procedure: TRANSURETHRAL RESECTION OF BLADDER TUMOR WITH GYRUS (TURBT-GYRUS);  Surgeon: Festus Aloe, MD;  Location: [facility];  Service: Urology;  Laterality: N/A;   Family History  Problem Relation Age of Onset   Alzheimer's disease Mother    Heart attack Father    Heart disease Father    Irregular heart beat Brother        DEFIB.   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   Tobacco Use   Smoking status: Former    Packs/day: 2.00    Years: 40.00    Additional pack years: 0.00    Total pack years: 80.00    Types: Cigarettes    Quit date: 06/01/1997    Years since quitting: 25.2   Smokeless tobacco: Never  Vaping Use   Vaping Use: Never used  Substance and Sexual Activity   Alcohol use: No   Drug use: No   Sexual activity: Not Currently  Other Topics Concern   Not on file  Social History Narrative   One level home with wife    77/2665 - 49 year old [place] lives with them   Social Determinants of Health   Financial Resource Strain: [place]  (08/14/2022)   Overall Financial Resource Strain (CARDIA)    Difficulty of Paying Living Expenses: Not hard at all  Food Insecurity: No Food Insecurity (08/14/2022)   Hunger Vital Sign    Worried About Running Out of Food in the Last Year: Never true    [place] in the Last Year: Never true  Transportation Needs: No Transportation Needs (08/14/2022)   PRAPARE - Radiographer, therapeutic (Medical): No    Lack of Transportation (Non-Medical): No  Physical Activity: Inactive (08/14/2022)   Exercise Vital Sign    Days of Exercise per Week: 0 days    Minutes of Exercise per Session: 0 min  Stress: No Stress Concern Present (08/14/2022)   [place]    Feeling of Stress : Not at all  Social Connections: Moderately Integrated (08/14/2022)   Social Connection and Isolation Panel [NHANES]    Frequency of Communication with Friends and Family: More than three times a week    Frequency of Social Gatherings with Friends and Family: More than three times a week    Attends Religious Services: More than 4 times per year    Active Member of [employer] or Organizations: No    Attends Music therapist: Never    Marital Status: Married    Tobacco Counseling Counseling given: Not Answered   Clinical Intake:  Pre-visit preparation completed: Yes  Pain : No/denies pain     Nutritional Risks: None Diabetes: No  How often do you need to have someone help you when you read instructions, pamphlets, or other written materials from your doctor or pharmacy?: 1 - Never  Diabetic?no   Interpreter Needed?: No  Information entered by :: Jadene Pierini, LPN   Activities of Daily Living    08/14/2022    9:21 AM  In your present state of health, do you have any difficulty performing the following activities:  Hearing? 0  Vision? 0  Difficulty concentrating or making decisions? 0  Walking or climbing stairs? 0  Dressing or bathing? 0  Doing errands, shopping? 0  Preparing Food and eating ? N  Using the Toilet? N  In the past six months, have you accidently leaked urine? N  Do you have problems with loss of bowel control? N  Managing your Medications? N  Managing your Finances? N  Housekeeping or managing your Housekeeping? N    Patient Care Team: Janora Norlander, DO as PCP - General  (Family Medicine) Troy Sine, MD as PCP - Cardiology (Cardiology) Alexis Frock, MD as Consulting Physician (Urology) Rozann Lesches,  Justus Memory, MD as Referring Physician (Dermatology) Ralene Muskrat as Physician Assistant (Chiropractic Medicine)  Indicate any recent Medical Services you may have received from other than [facility] providers in the past year (date may be approximate).     Assessment:   This is a routine wellness examination for [place].  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 0 0 1 0 0  Number falls in past yr: 0  0    Injury with Fall? 0  1    Risk for fall due to : No Fall Risks  Impaired balance/gait    Follow up Falls prevention discussed  Falls evaluation completed      FALL RISK PREVENTION PERTAINING TO THE HOME:  Any stairs in or around the home? No  If so, are there any without handrails? No  Home free of loose throw rugs in walkways, pet beds, electrical cords, etc? Yes  Adequate lighting in your home to reduce risk of falls? Yes   ASSISTIVE DEVICES UTILIZED TO PREVENT FALLS:  Life alert? No  Use of a cane, walker or w/c? No  Grab bars  in the bathroom? Yes  Shower chair or bench in shower? No  Elevated toilet seat or a handicapped toilet? No       10/21/2020   10:28 AM 12/27/2017    9:37 AM  MMSE - Mini Mental State Exam  Orientation to time 5 5  Orientation to Place 5 5  Registration 3 3  Attention/ Calculation 5 5  Recall 3 3  Language- name 2 objects 2 2  Language- repeat 1 1  Language- follow 3 step command 3 3  Language- read & follow direction 1 1  Write a sentence 1 1  Copy design 1 1  Total score 30 30        08/14/2022    9:21 AM 08/12/2021    9:26 AM 08/06/2020    9:08 AM 12/29/2018   11:42 AM  6CIT Screen  What Year? 0 points 0 points 0 points 0 points  What month? 0 points 0 points 0 points 0 points  What time? 0 points 0 points 0 points 0 points  Count back from 20 0 points 0 points 0 points 0 points  Months in reverse 0 points 0  points 0 points 0 points  Repeat phrase 0 points 0 points 0 points 0 points  Total Score 0 points 0 points 0 points 0 points    Immunizations Immunization History  Administered Date(s) Administered   Fluad Quad(high Dose 65+) 02/08/2019, 04/10/2020, 02/16/2022   Influenza, High Dose Seasonal PF 04/15/2017, 04/16/2021   Influenza, Seasonal, Injecte, Preservative Fre 02/28/2009   Influenza,inj,Quad PF,6+ Mos 02/25/2016, 03/01/2018   Moderna Sars-Covid-2 Vaccination 11/14/2019, 12/12/2019   Pneumococcal Conjugate-13 08/21/2019   Pneumococcal Polysaccharide-23 12/27/2017   Td (Adult),5 Lf Tetanus Toxid, Preservative Free 08/15/2020   Tdap 08/15/2020   Zoster Recombinat (Shingrix) 09/19/2021, 04/21/2022    TDAP status: Up to date  Flu Vaccine status: Up to date  Pneumococcal vaccine status: Up to date  Covid-19 vaccine status: Completed vaccines  Qualifies for Shingles Vaccine? Yes   Zostavax completed Yes   Shingrix Completed?: Yes  Screening Tests Health Maintenance  Topic Date Due   COVID-19 Vaccine (3 - Moderna risk series) 01/09/2020   Medicare  Annual Wellness (AWV)  08/14/2023   COLONOSCOPY (Pts 45-79yrs Insurance coverage will need to be confirmed)  05/11/2028   DTaP/Tdap/Td (2 - Td or Tdap) 08/16/2030   Pneumonia Vaccine 88+ Years old  Completed   INFLUENZA VACCINE  Completed   Hepatitis C Screening  Completed   Zoster Vaccines- Shingrix  Completed   HPV VACCINES  Aged Out    Health Maintenance  Health Maintenance Due  Topic Date Due   COVID-19 Vaccine (3 - Moderna risk series) 01/09/2020    Colorectal cancer screening: Type of screening: Colonoscopy. Completed 05/11/2018. Repeat every 10 years  Lung Cancer Screening: (Low Dose CT Chest recommended if Age 71-80 years, 30 pack-year currently smoking OR have quit w/in 15years.) does not qualify.   Lung Cancer Screening Referral: n/a  Additional Screening:  Hepatitis C Screening: does not qualify; Completed 02/25/2016  Vision Screening: Recommended annual ophthalmology exams for early detection of glaucoma and other disorders of the eye. Is the patient up to date with their annual eye exam?  Yes  Who is the provider or what is the name of the office in which the patient attends annual eye exams? Dr.Johnson  If pt is not established with a provider, would they like to be referred to a provider to establish care? No .   Dental Screening: Recommended annual dental exams for proper oral hygiene  Community Resource Referral / Chronic Care Management: CRR required this visit?  No   CCM required this visit?  No      Plan:     I have personally reviewed and noted the following in the patient's chart:   Medical and social history Use of alcohol, tobacco or illicit drugs  Current medications and supplements including opioid prescriptions. Patient is not currently taking opioid prescriptions. Functional ability and status Nutritional status Physical activity Advanced directives List of other physicians Hospitalizations, surgeries, and ER visits in previous 12  months Vitals Screenings to include cognitive, depression, and falls Referrals and appointments  In addition, I have reviewed and discussed with patient certain preventive protocols, quality metrics, and best practice recommendations. A written personalized care plan for preventive services as well as general preventive health recommendations were provided to patient.     Daphane Shepherd, LPN   [id number]   Nurse Notes: none

## 2022-08-14 NOTE — Patient Instructions (Signed)
Billy Rasmussen , Thank you for taking time to come for your Medicare Wellness Visit. I appreciate your ongoing commitment to your health goals. Please review the following plan we discussed and let me know if I can assist you in the future.   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 To do the lords work         This is a list of the screening recommended for you and due dates:  Health Maintenance  Topic Date Due   COVID-19 Vaccine (3 - Moderna risk series) 01/09/2020   Medicare Annual Wellness Visit  08/14/2023   Colon Cancer Screening  05/11/2028   DTaP/Tdap/Td vaccine (2 - Td or Tdap) 08/16/2030   Pneumonia Vaccine  Completed   Flu Shot  Completed   Hepatitis C Screening: USPSTF Recommendation to screen - Ages 18-79 yo.  Completed   Zoster (Shingles) Vaccine  Completed   HPV Vaccine  Aged Out    Advanced directives: Advance directive discussed with you today. I have provided a copy for you to complete at home and have notarized. Once this is complete please bring a copy in to our office so we can scan it into your chart.   Conditions/risks identified: Aim for 30 minutes of exercise or brisk walking, 6-8 glasses of water, and 5 servings of fruits and vegetables each day.   Next appointment: Follow up in one year for your annual wellness visit.   Preventive Care 75 Years and Older, Male  Preventive care refers to lifestyle choices and visits with your health care provider that can promote health and wellness. What does preventive care include? A yearly physical exam. This is also called an annual well check. Dental exams once or twice a year. Routine eye exams. Ask your health care provider how often you should have your eyes checked. Personal lifestyle choices, including: Daily care of your teeth and gums. Regular physical  activity. Eating a healthy diet. Avoiding tobacco and drug use. Limiting alcohol use. Practicing safe sex. Taking low doses of aspirin every day. Taking vitamin and mineral supplements as recommended by your health care provider. What happens during an annual well check? The services and screenings done by your health care provider during your annual well check will depend on your age, overall health, lifestyle risk factors, and family history of disease. Counseling  Your health care provider may ask you questions about your: Alcohol use. Tobacco use. Drug use. Emotional well-being. Home and relationship well-being. Sexual activity. Eating habits. History of falls. Memory and ability to understand (cognition). Work and work Statistician. Screening  You may have the following tests or measurements: Height, weight, and BMI. Blood pressure. Lipid and cholesterol levels. These may be checked every 5 years, or more frequently if you are over 65 years old. Skin check. Lung cancer screening. You may have this screening every year starting at age 45 if you have a 30-pack-year history of smoking and currently smoke or have quit within the past 15 years. Fecal occult blood test (FOBT) of the stool. You may have this test every year starting at age 64. Flexible sigmoidoscopy or colonoscopy. You may have a sigmoidoscopy every 5 years or a colonoscopy every 10 years starting at age 98. Prostate  cancer screening. Recommendations will vary depending on your family history and other risks. Hepatitis C blood test. Hepatitis B blood test. Sexually transmitted disease (STD) testing. Diabetes screening. This is done by checking your blood sugar (glucose) after you have not eaten for a while (fasting). You may have this done every 1-3 years. Abdominal aortic aneurysm (AAA) screening. You may need this if you are a current or former smoker. Osteoporosis. You may be screened starting at age 53 if you are  at high risk. Talk with your health care provider about your test results, treatment options, and if necessary, the need for more tests. Vaccines  Your health care provider may recommend certain vaccines, such as: Influenza vaccine. This is recommended every year. Tetanus, diphtheria, and acellular pertussis (Tdap, Td) vaccine. You may need a Td booster every 10 years. Zoster vaccine. You may need this after age 35. Pneumococcal 13-valent conjugate (PCV13) vaccine. One dose is recommended after age 12. Pneumococcal polysaccharide (PPSV23) vaccine. One dose is recommended after age 92. Talk to your health care provider about which screenings and vaccines you need and how often you need them. This information is not intended to replace advice given to you by your health care provider. Make sure you discuss any questions you have with your health care provider. Document Released: 06/14/2015 Document Revised: 02/05/2016 Document Reviewed: 03/19/2015 Elsevier Interactive Patient Education  2017 [place] Prevention in the Home Falls can cause injuries. They can happen to people of all ages. There are many things you can do to make your home safe and to help prevent falls. What can I do on the outside of my home? Regularly fix the edges of walkways and driveways and fix any cracks. Remove anything that might make you trip as you walk through a door, such as a raised step or threshold. Trim any bushes or trees on the path to your home. Use bright outdoor lighting. Clear any walking paths of anything that might make someone trip, such as rocks or tools. Regularly check to see if handrails are loose or broken. Make sure that both sides of any steps have handrails. Any raised decks and porches should have guardrails on the edges. Have any leaves, snow, or ice cleared regularly. Use sand or salt on walking paths during winter. Clean up any spills in your garage right away. This includes oil  or grease spills. What can I do in the bathroom? Use night lights. Install grab bars by the toilet and in the tub and shower. Do not use towel bars as grab bars. Use non-skid mats or decals in the tub or shower. If you need to sit down in the shower, use a plastic, non-slip stool. Keep the floor dry. Clean up any water that spills on the floor as soon as it happens. Remove soap buildup in the tub or shower regularly. Attach bath mats securely with double-sided non-slip rug tape. Do not have throw rugs and other things on the floor that can make you trip. What can I do in the bedroom? Use night lights. Make sure that you have a light by your bed that is easy to reach. Do not use any sheets or blankets that are too big for your bed. They should not hang down onto the floor. Have a firm chair that has side arms. You can use this for support while you get dressed. Do not have throw rugs and other things on the floor that can make you trip. What  can I do in the kitchen? Clean up any spills right away. Avoid walking on wet floors. Keep items that you use a lot in easy-to-reach places. If you need to reach something above you, use a strong step stool that has a grab bar. Keep electrical cords out of the way. Do not use floor polish or wax that makes floors slippery. If you must use wax, use non-skid floor wax. Do not have throw rugs and other things on the floor that can make you trip. What can I do with my stairs? Do not leave any items on the stairs. Make sure that there are handrails on both sides of the stairs and use them. Fix handrails that are broken or loose. Make sure that handrails are as long as the stairways. Check any carpeting to make sure that it is firmly attached to the stairs. Fix any carpet that is loose or worn. Avoid having throw rugs at the top or bottom of the stairs. If you do have throw rugs, attach them to the floor with carpet tape. Make sure that you have a light  switch at the top of the stairs and the bottom of the stairs. If you do not have them, ask someone to add them for you. What else can I do to help prevent falls? Wear shoes that: Do not have high heels. Have rubber bottoms. Are comfortable and fit you well. Are closed at the toe. Do not wear sandals. If you use a stepladder: Make sure that it is fully opened. Do not climb a closed stepladder. Make sure that both sides of the stepladder are locked into place. Ask someone to hold it for you, if possible. Clearly mark and make sure that you can see: Any grab bars or handrails. First and last steps. Where the edge of each step is. Use tools that help you move around (mobility aids) if they are needed. These include: Canes. Walkers. Scooters. Crutches. Turn on the lights when you go into a dark area. Replace any light bulbs as soon as they burn out. Set up your furniture so you have a clear path. Avoid moving your furniture around. If any of your floors are uneven, fix them. If there are any pets around you, be aware of where they are. Review your medicines with your doctor. Some medicines can make you feel dizzy. This can increase your chance of falling. Ask your doctor what other things that you can do to help prevent falls. This information is not intended to replace advice given to you by your health care provider. Make sure you discuss any questions you have with your health care provider. Document Released: 03/14/2009 Document Revised: 10/24/2015 Document Reviewed: 06/22/2014 Elsevier Interactive Patient Education  2017 [employer].

## 2022-08-21 ENCOUNTER — Encounter: Payer: Self-pay | Admitting: Family Medicine

## 2022-08-21 ENCOUNTER — Ambulatory Visit (INDEPENDENT_AMBULATORY_CARE_PROVIDER_SITE_OTHER): Payer: Medicare HMO | Admitting: Family Medicine

## 2022-08-21 VITALS — BP 108/75 | HR 81 | Temp 98.2°F | Ht 66.0 in | Wt 218.0 lb

## 2022-08-21 DIAGNOSIS — W57XXXA Bitten or stung by nonvenomous insect and other nonvenomous arthropods, initial encounter: Secondary | ICD-10-CM | POA: Diagnosis not present

## 2022-08-21 DIAGNOSIS — R0609 Other forms of dyspnea: Secondary | ICD-10-CM

## 2022-08-21 DIAGNOSIS — S30861A Insect bite (nonvenomous) of abdominal wall, initial encounter: Secondary | ICD-10-CM

## 2022-08-21 DIAGNOSIS — R5383 Other fatigue: Secondary | ICD-10-CM | POA: Diagnosis not present

## 2022-08-21 DIAGNOSIS — R6889 Other general symptoms and signs: Secondary | ICD-10-CM | POA: Diagnosis not present

## 2022-08-21 MED ORDER — DOXYCYCLINE HYCLATE 100 MG PO TABS: 100.0000 mg | ORAL_TABLET | Freq: Two times a day (BID) | ORAL | 0 refills | Status: AC

## 2022-08-21 NOTE — Progress Notes (Signed)
Subjective: CC: DOE, fatigue PCP: Janora Norlander, DO [id number] Billy Rasmussen is a 77 y.o. male presenting to clinic today for:  1.  Dyspnea on exertion, fatigue Patient reports symptoms onset about a month ago.  He was seen in office and treated for what was suspected to be a URI.  He had chest x-ray done and this was unremarkable.  He has contacted cardiology but cannot get an appointment until May.  No overt chest pain, nausea, vomiting.  He gets short of breath with things like getting in and out of the car.  He has had no falls or LOC's.  He is compliant with all medications.  Denies any leg swelling outside of baseline.  No calf pain.  Did sustain a tick bite 3 days ago.  No rashes/ fevers.   ROS: Per HPI  No Known Allergies Past Medical History:  Diagnosis Date   Arthritis    knees, shoulders, elbows   Bladder cancer [facility])    urologist-  dr eskridge   Diverticulosis of colon    Fibromyalgia    GERD (gastroesophageal reflux disease)    History of diverticulitis of colon    History of gastric ulcer    due to aleve   Hypertension    Lower urinary tract symptoms (LUTS)    OSA (obstructive sleep apnea)    NON-COMPLIANT  CPAP  --- BUT PT USES OXYGEN AT NIGHT 2.5L VIA [place] (PT'S DECISION)   PONV (postoperative nausea and vomiting)    Psoriasis    Sepsis ([place])    january 2021   Tinnitus    right ear more, has tranmitter in right ear removable at hs   Wears glasses    Wears partial dentures     Current Outpatient Medications:    fluticasone (FLONASE) 50 MCG/ACT nasal spray, Place 2 sprays into both nostrils daily., Disp: 16 g, Rfl: 6   lisinopril (ZESTRIL) 5 MG tablet, Take 1 tablet (5 mg total) by mouth daily., Disp: 90 tablet, Rfl: 0   meclizine (ANTIVERT) 25 MG tablet, , Disp: , Rfl:    Multiple Vitamin (MULTIVITAMIN ADULT) TABS, Take 1 tablet by mouth daily., Disp: , Rfl:    polyvinyl alcohol (LIQUIFILM TEARS) 1.4 % ophthalmic solution, Place 1 drop into both eyes as  needed for dry eyes., Disp: 15 mL, Rfl: 0 Social History   Socioeconomic History   Marital status: Married    Spouse name: Peggy   Number of children: 2   Years of education: 14   Highest education level: Associate degree: occupational, Hotel manager, or vocational program  Occupational History   Occupation: retired    Comment: [employer], 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   Tobacco Use   Smoking status: Former    Packs/day: 2.00    Years: 40.00    Additional pack years: 0.00    Total pack years: 80.00    Types: Cigarettes    Quit date: 06/01/1997    Years since quitting: 25.2   Smokeless tobacco: Never  Vaping Use   Vaping Use: Never used  Substance and Sexual Activity   Alcohol use: No   Drug use: No   Sexual activity: Not Currently  Other Topics Concern   Not on file  Social History Narrative   One level home with wife    38/3666 - 80 year old [place] lives with them   Social Determinants of Health   Financial Resource Strain: [place]  (08/14/2022)   Overall Financial Resource Strain ([place])  Difficulty of Paying Living Expenses: Not hard at all  Food Insecurity: No Food Insecurity (08/14/2022)   Hunger Vital Sign    Worried About Running Out of Food in the Last Year: Never true    Ran Out of Food in the Last Year: Never true  Transportation Needs: No Transportation Needs (08/14/2022)   PRAPARE - Hydrologist (Medical): No    Lack of Transportation (Non-Medical): No  Physical Activity: Inactive (08/14/2022)   Exercise Vital Sign    Days of Exercise per Week: 0 days    Minutes of Exercise per Session: 0 min  Stress: No Stress Concern Present (08/14/2022)   [place]    Feeling of Stress : Not at all  Social Connections: Moderately Integrated (08/14/2022)   Social Connection and Isolation Panel [NHANES]    Frequency of Communication with Friends and Family: More than three times a week    Frequency  of Social Gatherings with Friends and Family: More than three times a week    Attends Religious Services: More than 4 times per year    Active Member of [employer] or Organizations: No    Attends Archivist Meetings: Never    Marital Status: Married  Human resources officer Violence: Not At Risk (08/14/2022)   Humiliation, Afraid, Rape, and Kick questionnaire    Fear of Current or Ex-Partner: No    Emotionally Abused: No    Physically Abused: No    Sexually Abused: No   Family History  Problem Relation Age of Onset   Alzheimer's disease Mother    Heart attack Father    Heart disease Father    Irregular heart beat Brother        DEFIB.   Congestive Heart Failure Brother    Diabetes Daughter     Objective: Office vital signs reviewed. BP 108/75   Pulse 81   Temp 98.2 F (36.8 C)   Ht 5\' 6"  (1.676 m)   Wt 218 lb (98.9 kg)   SpO2 98%   BMI 35.19 kg/m   Physical Examination:  General: Awake, alert, nontoxic-appearing male.  No acute distress HEENT: sclera white, no exophthalmos.  No goiter Cardio: regular rate and rhythm, S1S2 heard, no murmurs appreciated Pulm: clear to auscultation bilaterally, no wheezes, rhonchi or rales; normal work of breathing on room air  Assessment/ Plan: 77 y.o. male   Fatigue, unspecified type - Plan: Vitamin B12, TSH, T4, Free, CMP14+EGFR, CBC with Differential, Iron, TIBC and Ferritin Panel, CANCELED: COVID-19, Flu A+B and RSV, CANCELED: Veritor Flu A/B Waived  DOE (dyspnea on exertion) - Plan: CMP14+EGFR, CBC with Differential, Iron, TIBC and Ferritin Panel, CT Chest Wo Contrast, EKG 12-Lead, Magnesium  Tick bite of abdominal wall, initial encounter - Plan: doxycycline (VIBRA-TABS) 100 MG tablet  Check for metabolic etiology.  EKG DID show some changes.  I had Dr Dettinger take a second look with me.  Borderline LBBB. ?ischemic event in last month?  I've reached out to his cardiologist to see if we can get him seen sooner.  Discussed RED flags  with wife and patient. If ANY changes or worsening, they are to seek IMMEDIATE medical attention in ER.  No orders of the defined types were placed in this encounter.  No orders of the defined types were placed in this encounter.    Janora Norlander, DO [place] [phone]

## 2022-08-22 ENCOUNTER — Encounter: Payer: Self-pay | Admitting: Oncology

## 2022-08-22 LAB — IRON,TIBC AND FERRITIN PANEL
Ferritin: 237 ng/mL (ref 30–400)
Iron Saturation: 13 % — ABNORMAL LOW (ref 15–55)
Iron: 31 ug/dL — ABNORMAL LOW (ref 38–169)
Total Iron Binding Capacity: 236 ug/dL — ABNORMAL LOW (ref 250–450)
UIBC: 205 ug/dL (ref 111–343)

## 2022-08-22 LAB — CBC WITH DIFFERENTIAL/PLATELET
Basophils Absolute: 0 10*3/uL (ref 0.0–0.2)
Basos: 0 %
EOS (ABSOLUTE): 0.2 10*3/uL (ref 0.0–0.4)
Eos: 2 %
Hematocrit: 41.6 % (ref 37.5–51.0)
Hemoglobin: 14.2 g/dL (ref 13.0–17.7)
Immature Grans (Abs): 0 10*3/uL (ref 0.0–0.1)
Immature Granulocytes: 1 %
Lymphocytes Absolute: 1.2 10*3/uL (ref 0.7–3.1)
Lymphs: 17 %
MCH: 31.1 pg (ref 26.6–33.0)
MCHC: 34.1 g/dL (ref 31.5–35.7)
MCV: 91 fL (ref 79–97)
Monocytes Absolute: 1 10*3/uL — ABNORMAL HIGH (ref 0.1–0.9)
Monocytes: 14 %
Neutrophils Absolute: 4.9 10*3/uL (ref 1.4–7.0)
Neutrophils: 66 %
Platelets: 148 10*3/uL — ABNORMAL LOW (ref 150–450)
RBC: 4.57 x10E6/uL (ref 4.14–5.80)
RDW: 13.5 % (ref 11.6–15.4)
WBC: 7.4 10*3/uL (ref 3.4–10.8)

## 2022-08-22 LAB — CMP14+EGFR
ALT: 13 IU/L (ref 0–44)
AST: 29 IU/L (ref 0–40)
Albumin/Globulin Ratio: 1.5 (ref 1.2–2.2)
Albumin: 4.3 g/dL (ref 3.8–4.8)
Alkaline Phosphatase: 64 IU/L (ref 44–121)
BUN/Creatinine Ratio: 16 (ref 10–24)
BUN: 23 mg/dL (ref 8–27)
Bilirubin Total: 0.8 mg/dL (ref 0.0–1.2)
CO2: 24 mmol/L (ref 20–29)
Calcium: 9.8 mg/dL (ref 8.6–10.2)
Chloride: 96 mmol/L (ref 96–106)
Creatinine, Ser: 1.48 mg/dL — ABNORMAL HIGH (ref 0.76–1.27)
Globulin, Total: 2.8 g/dL (ref 1.5–4.5)
Glucose: 107 mg/dL — ABNORMAL HIGH (ref 70–99)
Potassium: 5 mmol/L (ref 3.5–5.2)
Sodium: 134 mmol/L (ref 134–144)
Total Protein: 7.1 g/dL (ref 6.0–8.5)
eGFR: 49 mL/min/{1.73_m2} — ABNORMAL LOW (ref >59)

## 2022-08-22 LAB — T4, FREE: Free T4: 1.15 ng/dL (ref 0.82–1.77)

## 2022-08-22 LAB — MAGNESIUM: Magnesium: 2.1 mg/dL (ref 1.6–2.3)

## 2022-08-22 LAB — VITAMIN B12: Vitamin B-12: 418 pg/mL (ref 232–1245)

## 2022-08-22 LAB — TSH: TSH: 4.62 u[IU]/mL — ABNORMAL HIGH (ref 0.450–4.500)

## 2022-08-24 ENCOUNTER — Other Ambulatory Visit: Payer: Self-pay | Admitting: Family Medicine

## 2022-08-24 ENCOUNTER — Telehealth: Payer: Self-pay | Admitting: Family Medicine

## 2022-08-24 DIAGNOSIS — E038 Other specified hypothyroidism: Secondary | ICD-10-CM

## 2022-08-24 DIAGNOSIS — R0609 Other forms of dyspnea: Secondary | ICD-10-CM

## 2022-08-24 DIAGNOSIS — G4734 Idiopathic sleep related nonobstructive alveolar hypoventilation: Secondary | ICD-10-CM

## 2022-08-24 DIAGNOSIS — D696 Thrombocytopenia, unspecified: Secondary | ICD-10-CM

## 2022-08-24 DIAGNOSIS — G4733 Obstructive sleep apnea (adult) (pediatric): Secondary | ICD-10-CM

## 2022-08-24 MED ORDER — LEVOTHYROXINE SODIUM 25 MCG PO TABS: 25.0000 ug | ORAL_TABLET | Freq: Every day | ORAL | 3 refills | Status: DC

## 2022-08-24 NOTE — Telephone Encounter (Signed)
See other encounter from 08/24/22

## 2022-08-24 NOTE — Telephone Encounter (Signed)
We did not see where we are to change the O2 order but we did order a pulse Ox by mistake and order a pulse ox, overnight and I faxed this to [facility] at [phone].

## 2022-08-24 NOTE — Telephone Encounter (Addendum)
Additional information needed, order for pulse oximetry done faxed to [facility] since pt only uses at night to [phone], OV notes sent via Epic

## 2022-08-24 NOTE — Progress Notes (Signed)
Additional information needed, order for pulse oximetry done faxed to [facility] since pt only uses at night to [phone], OV notes sent via Epic

## 2022-08-24 NOTE — Addendum Note (Signed)
Addended by: Antonietta Barcelona D on: 08/24/2022 10:28 AM   Modules accepted: Orders

## 2022-08-24 NOTE — Telephone Encounter (Signed)
Do you know what they are talking about?

## 2022-08-24 NOTE — Addendum Note (Signed)
Addended by: Antonietta Barcelona D on: 08/24/2022 03:29 PM   Modules accepted: Orders

## 2022-08-25 ENCOUNTER — Encounter: Payer: Self-pay | Admitting: Oncology

## 2022-08-25 NOTE — Addendum Note (Signed)
Addended by: Antonietta Barcelona D on: 08/25/2022 04:05 PM   Modules accepted: Orders

## 2022-08-25 NOTE — Telephone Encounter (Signed)
Order had to be updated no say On Room Air, reordered and faxed.

## 2022-08-28 ENCOUNTER — Encounter: Payer: Self-pay | Admitting: Oncology

## 2022-08-31 ENCOUNTER — Ambulatory Visit (HOSPITAL_COMMUNITY)
Admission: RE | Admit: 2022-08-31 | Discharge: 2022-08-31 | Disposition: A | Discharge status: Home / Self Care | Payer: Medicare HMO | Source: Ambulatory Visit | Attending: Family Medicine | Admitting: Family Medicine

## 2022-08-31 DIAGNOSIS — R0609 Other forms of dyspnea: Secondary | ICD-10-CM | POA: Insufficient documentation

## 2022-08-31 DIAGNOSIS — R0600 Dyspnea, unspecified: Secondary | ICD-10-CM | POA: Diagnosis not present

## 2022-08-31 DIAGNOSIS — I7121 Aneurysm of the ascending aorta, without rupture: Secondary | ICD-10-CM | POA: Diagnosis not present

## 2022-09-01 ENCOUNTER — Other Ambulatory Visit: Payer: Self-pay

## 2022-09-02 ENCOUNTER — Other Ambulatory Visit: Payer: Self-pay | Admitting: Family Medicine

## 2022-09-02 DIAGNOSIS — E049 Nontoxic goiter, unspecified: Secondary | ICD-10-CM | POA: Insufficient documentation

## 2022-09-02 DIAGNOSIS — I7121 Aneurysm of the ascending aorta, without rupture: Secondary | ICD-10-CM | POA: Insufficient documentation

## 2022-09-04 DIAGNOSIS — R0683 Snoring: Secondary | ICD-10-CM | POA: Diagnosis not present

## 2022-09-04 DIAGNOSIS — G4730 Sleep apnea, unspecified: Secondary | ICD-10-CM | POA: Diagnosis not present

## 2022-09-09 ENCOUNTER — Other Ambulatory Visit: Payer: Self-pay | Admitting: Family Medicine

## 2022-09-09 DIAGNOSIS — I7121 Aneurysm of the ascending aorta, without rupture: Secondary | ICD-10-CM

## 2022-09-11 ENCOUNTER — Ambulatory Visit (HOSPITAL_COMMUNITY)
Admission: RE | Admit: 2022-09-11 | Discharge: 2022-09-11 | Disposition: A | Discharge status: Home / Self Care | Payer: Medicare HMO | Source: Ambulatory Visit | Attending: Family Medicine | Admitting: Family Medicine

## 2022-09-11 DIAGNOSIS — E041 Nontoxic single thyroid nodule: Secondary | ICD-10-CM | POA: Diagnosis not present

## 2022-09-11 DIAGNOSIS — E049 Nontoxic goiter, unspecified: Secondary | ICD-10-CM

## 2022-09-12 ENCOUNTER — Emergency Department (HOSPITAL_COMMUNITY)
Admission: EM | Admit: 2022-09-12 | Discharge: 2022-09-12 | Disposition: A | Discharge status: Home / Self Care | Payer: Medicare HMO | Attending: Emergency Medicine | Admitting: Emergency Medicine

## 2022-09-12 ENCOUNTER — Emergency Department (HOSPITAL_COMMUNITY): Payer: Medicare HMO

## 2022-09-12 ENCOUNTER — Other Ambulatory Visit: Payer: Self-pay

## 2022-09-12 DIAGNOSIS — R0602 Shortness of breath: Secondary | ICD-10-CM | POA: Diagnosis not present

## 2022-09-12 DIAGNOSIS — E039 Hypothyroidism, unspecified: Secondary | ICD-10-CM | POA: Diagnosis not present

## 2022-09-12 DIAGNOSIS — R0789 Other chest pain: Secondary | ICD-10-CM | POA: Diagnosis not present

## 2022-09-12 DIAGNOSIS — I10 Essential (primary) hypertension: Secondary | ICD-10-CM | POA: Insufficient documentation

## 2022-09-12 DIAGNOSIS — Z79899 Other long term (current) drug therapy: Secondary | ICD-10-CM | POA: Insufficient documentation

## 2022-09-12 DIAGNOSIS — Z8551 Personal history of malignant neoplasm of bladder: Secondary | ICD-10-CM | POA: Diagnosis not present

## 2022-09-12 DIAGNOSIS — I447 Left bundle-branch block, unspecified: Secondary | ICD-10-CM | POA: Diagnosis not present

## 2022-09-12 DIAGNOSIS — R079 Chest pain, unspecified: Secondary | ICD-10-CM | POA: Diagnosis present

## 2022-09-12 DIAGNOSIS — I959 Hypotension, unspecified: Secondary | ICD-10-CM | POA: Diagnosis not present

## 2022-09-12 LAB — HEPATIC FUNCTION PANEL
ALT: 23 U/L (ref 0–44)
AST: 38 U/L (ref 15–41)
Albumin: 4.1 g/dL (ref 3.5–5.0)
Alkaline Phosphatase: 47 U/L (ref 38–126)
Bilirubin, Direct: 0.2 mg/dL (ref 0.0–0.2)
Indirect Bilirubin: 1 mg/dL — ABNORMAL HIGH (ref 0.3–0.9)
Total Bilirubin: 1.2 mg/dL (ref 0.3–1.2)
Total Protein: 7.4 g/dL (ref 6.5–8.1)

## 2022-09-12 LAB — TROPONIN I (HIGH SENSITIVITY)
Troponin I (High Sensitivity): 25 ng/L — ABNORMAL HIGH (ref <18)
Troponin I (High Sensitivity): 24 ng/L — ABNORMAL HIGH (ref <18)

## 2022-09-12 LAB — CBC
HCT: 40.5 % (ref 39.0–52.0)
Hemoglobin: 13.7 g/dL (ref 13.0–17.0)
MCH: 30.7 pg (ref 26.0–34.0)
MCHC: 33.8 g/dL (ref 30.0–36.0)
MCV: 90.8 fL (ref 80.0–100.0)
Platelets: 118 10*3/uL — ABNORMAL LOW (ref 150–400)
RBC: 4.46 MIL/uL (ref 4.22–5.81)
RDW: 13.8 % (ref 11.5–15.5)
WBC: 6 10*3/uL (ref 4.0–10.5)
nRBC: 0 % (ref 0.0–0.2)

## 2022-09-12 LAB — BASIC METABOLIC PANEL
Anion gap: 8 (ref 5–15)
BUN: 23 mg/dL (ref 8–23)
CO2: 27 mmol/L (ref 22–32)
Calcium: 9.8 mg/dL (ref 8.9–10.3)
Chloride: 102 mmol/L (ref 98–111)
Creatinine, Ser: 1.33 mg/dL — ABNORMAL HIGH (ref 0.61–1.24)
GFR, Estimated: 56 mL/min — ABNORMAL LOW (ref >60)
Glucose, Bld: 92 mg/dL (ref 70–99)
Potassium: 4.5 mmol/L (ref 3.5–5.1)
Sodium: 137 mmol/L (ref 135–145)

## 2022-09-12 LAB — LIPASE, BLOOD: Lipase: 34 U/L (ref 11–51)

## 2022-09-12 MED ORDER — ALUM & MAG HYDROXIDE-SIMETH 200-200-20 MG/5ML PO SUSP
30.0000 mL | Freq: Once | ORAL | Status: AC
  Administered 2022-09-12: 30 mL via ORAL
  Filled 2022-09-12: qty 30

## 2022-09-12 NOTE — ED Triage Notes (Signed)
Patient arrives from home with c/o chest pressure. + SOB with exertion.  Patient recently dx with a AAA, last week. Denies N/V. A&O x 4, NAD

## 2022-09-12 NOTE — ED Provider Notes (Signed)
[name]   CSN: [id number] Arrival date & time: 09/12/22  7829     History  Chief Complaint  Patient presents with   Chest Pain    Billy Rasmussen is a 77 y.o. male.  Pt is a 77 yo male with pmhx significant for hypothyroidism, HTN, fibromyalgia, GERD, and bladder cancer (s/p TURB/left nephrectomy, cystoprostatectomy), and 4.5 cm AAA.  Pt has been having chest pressure since yesterday around 1530.  Pt said it felt like gas, so he took some antacids.  They helped, but did not make it go away.  He did not sleep well last night.  He continues to feel like he has pressure this am, so he came in for eval.  Sx worsen with exertion.       Home Medications Prior to Admission medications   Medication Sig Start Date End Date Taking? Authorizing Provider  levothyroxine (SYNTHROID) 25 MCG tablet Take 1 tablet (25 mcg total) by mouth daily before breakfast. Take on an empty stomach with water only.  Wait to consume other foods/ drinks/ meds for 30 minutes. 08/24/22   Raliegh Ip, DO  fluticasone (FLONASE) 50 MCG/ACT nasal spray Place 2 sprays into both nostrils daily. 07/30/22   Gabriel Earing, [name]  lisinopril (ZESTRIL) 5 MG tablet Take 1 tablet (5 mg total) by mouth daily. 07/31/22   Raliegh Ip, DO  meclizine (ANTIVERT) 25 MG tablet     [provider]  Multiple Vitamin (MULTIVITAMIN ADULT) TABS Take 1 tablet by mouth daily.    [provider]  polyvinyl alcohol (LIQUIFILM TEARS) 1.4 % ophthalmic solution Place 1 drop into both eyes as needed for dry eyes. 05/16/19   Saundra Shelling, MD      Allergies    Patient has no known allergies.    Review of Systems   Review of Systems  Cardiovascular:  Positive for chest pain.  All other systems reviewed and are negative.   Physical Exam Updated Vital Signs BP 136/86   Pulse 83   Temp 98.2 F (36.8 C) (Oral)   Resp 18   SpO2 96%  Physical  Exam Vitals and nursing note reviewed.  Constitutional:      Appearance: He is well-developed.  HENT:     Head: Normocephalic and atraumatic.  Eyes:     Extraocular Movements: Extraocular movements intact.     Pupils: Pupils are equal, round, and reactive to light.  Cardiovascular:     Rate and Rhythm: Normal rate and regular rhythm.     Heart sounds: Normal heart sounds.  Pulmonary:     Effort: Pulmonary effort is normal.     Breath sounds: Normal breath sounds.  Abdominal:     General: Bowel sounds are normal.     Palpations: Abdomen is soft.     Comments: Urostomy noted  Musculoskeletal:        General: Normal range of motion.     Cervical back: Normal range of motion and neck supple.  Skin:    General: Skin is warm.     Capillary Refill: Capillary refill takes less than 2 seconds.  Neurological:     General: No focal deficit present.     Mental Status: He is alert and oriented to person, place, and time.  Psychiatric:        Mood and Affect: Mood normal.        Behavior: Behavior normal.  ED Results / Procedures / Treatments   Labs (all labs ordered are listed, but only abnormal results are displayed) Labs Reviewed  BASIC METABOLIC PANEL - Abnormal; Notable for the following components:      Result Value   Creatinine, Ser 1.33 (*)    GFR, Estimated 56 (*)    All other components within normal limits  CBC - Abnormal; Notable for the following components:   Platelets 118 (*)    All other components within normal limits  HEPATIC FUNCTION PANEL - Abnormal; Notable for the following components:   Indirect Bilirubin 1.0 (*)    All other components within normal limits  TROPONIN I (HIGH SENSITIVITY) - Abnormal; Notable for the following components:   Troponin I (High Sensitivity) 25 (*)    All other components within normal limits  TROPONIN I (HIGH SENSITIVITY) - Abnormal; Notable for the following components:   Troponin I (High Sensitivity) 24 (*)    All other  components within normal limits  LIPASE, BLOOD    EKG EKG Interpretation  Date/Time:  Saturday September 12 2022 07:24:06 EDT Ventricular Rate:  81 PR Interval:  257 QRS Duration: 174 QT Interval:  448 QTC Calculation: 521 R Axis:   19 Text Interpretation: Sinus rhythm Prolonged PR interval LVH with secondary repolarization abnormality Anterior infarct, old Prolonged QT interval now LBBB Confirmed by Jacalyn Lefevre [phone]) on 09/12/2022 7:45:58 AM  Radiology DG Chest Port 1 View  Result Date: 09/12/2022 CLINICAL DATA:  Chest pressure and shortness of breath EXAM: PORTABLE CHEST 1 VIEW COMPARISON:  07/30/2022 FINDINGS: Borderline heart size. Aortic tortuosity. There is no edema, consolidation, effusion, or pneumothorax. Bilateral glenohumeral arthroplasty. Artifact from EKG leads. IMPRESSION: No acute finding. Electronically Signed   By: Tiburcio Pea M.D.   On: 09/12/2022 07:52   US THYROID  Result Date: 09/12/2022 CLINICAL DATA:  Substernal goiter on CT EXAM: THYROID ULTRASOUND TECHNIQUE: Ultrasound examination of the thyroid gland and adjacent soft tissues was performed. COMPARISON:  CT chest 08/31/2022 FINDINGS: Parenchymal Echotexture: Moderately heterogeneous Isthmus: 0.9 cm Right lobe: 5.3 x 1.7 x 1.7 cm Left lobe: 6.9 x 3.1 x 3.8 cm _________________________________________________________ Estimated total number of nodules >/= 1 cm: 3 Number of spongiform nodules >/=  2 cm not described below (TR1): 0 Number of mixed cystic and solid nodules >/= 1.5 cm not described below (TR2): 0 _________________________________________________________ Nodule # 1: Location: Isthmus; superior Maximum size: 2.0 cm; Other 2 dimensions: 1.2 x 1.8 cm Composition: solid/almost completely solid (2) Echogenicity: hypoechoic (2) Shape: not taller-than-wide (0) Margins: ill-defined (0) Echogenic foci: none (0) ACR TI-RADS total points: 4. ACR TI-RADS risk category: TR4 (4-6 points). ACR TI-RADS recommendations:  **Given size (>/= 1.5 cm) and appearance, fine needle aspiration of this moderately suspicious nodule should be considered based on TI-RADS criteria. _________________________________________________________ Nodule # 2: Location: Left; mid Maximum size: 4.2 cm; Other 2 dimensions: 2.4 x 3.6 cm Composition: solid/almost completely solid (2) Echogenicity: isoechoic (1) Shape: not taller-than-wide (0) Margins: smooth (0) Echogenic foci: none (0) ACR TI-RADS total points: 3. ACR TI-RADS risk category: TR3 (3 points). ACR TI-RADS recommendations: **Given size (>/= 2.5 cm) and appearance, fine needle aspiration of this mildly suspicious nodule should be considered based on TI-RADS criteria. _________________________________________________________ Nodule # 3: Location: Left; inferior Maximum size: 2.1 cm; Other 2 dimensions: 2.1 x 2.0 cm Composition: solid/almost completely solid (2) Echogenicity: isoechoic (1) Shape: not taller-than-wide (0) Margins: ill-defined (0) Echogenic foci: none (0) ACR TI-RADS total points: 3. ACR TI-RADS risk category:  TR3 (3 points). ACR TI-RADS recommendations: *Given size (>/= 1.5 - 2.4 cm) and appearance, a follow-up ultrasound in 1 year should be considered based on TI-RADS criteria. IMPRESSION: 1. Nodules 1 and 2 meet criteria for FNA. 2. Nodule 3 meets criteria for annual imaging surveillance until 5 years of stability is documented. The above is in keeping with the ACR TI-RADS recommendations - J Am Coll Radiol 2017;14:587-595. Electronically Signed   By: Acquanetta Belling M.D.   On: 09/12/2022 07:37    Procedures Procedures    Medications Ordered in ED Medications  alum & mag hydroxide-simeth (MAALOX/MYLANTA) 200-200-20 MG/5ML suspension 30 mL (30 mLs Oral Given 09/12/22 0744)    ED Course/ Medical Decision Making/ A&P                             Medical Decision Making Amount and/or Complexity of Data Reviewed Labs: ordered. Radiology: ordered.  Risk OTC  drugs.   This patient presents to the ED for concern of cp, this involves an extensive number of treatment options, and is a complaint that carries with it a high risk of complications and morbidity.  The differential diagnosis includes cardiac, pulm, gi   Co morbidities that complicate the patient evaluation  hypothyroidism, HTN, fibromyalgia, GERD, and bladder cancer (s/p TURB/left nephrectomy, cystoprostatectomy), and 4.5 cm AAA   Additional history obtained:  Additional history obtained from epic chart review  Lab Tests:  I Ordered, and personally interpreted labs.  The pertinent results include:  cbc nl, bmp nl other than cr sl elevated at 1.33, lft nl, lip nl, trop 25 and 24   Imaging Studies ordered:  I ordered imaging studies including cxr  I independently visualized and interpreted imaging which showed  CXR:  No acute finding.  I agree with the radiologist interpretation   Cardiac Monitoring:  The patient was maintained on a cardiac monitor.  I personally viewed and interpreted the cardiac monitored which showed an underlying rhythm of: nsr   Medicines ordered and prescription drug management:  I ordered medication including gi cocktail  for sx  Reevaluation of the patient after these medicines showed that the patient improved I have reviewed the patients home medicines and have made adjustments as needed  Critical Interventions:  trop   Problem List / ED Course:  Atypical CP:  EKG does show a new LBBB, since 2021.  However, he has 2 neg troponins.  Sx have been going on since yesterday around 1530, so I think he can go home with outpatient cards f/u.  Amb ref placed.   Reevaluation:  After the interventions noted above, I reevaluated the patient and found that they have :improved   Social Determinants of Health:  Lives at home   Dispostion:  After consideration of the diagnostic results and the patients response to treatment, I feel that the  patent would benefit from discharge with outpatient f/u.          Final Clinical Impression(s) / ED Diagnoses Final diagnoses:  Atypical chest pain    Rx / DC Orders ED Discharge Orders          Ordered    Ambulatory referral to Cardiology        09/12/22 1056              Jacalyn Lefevre, MD 09/12/22 1057

## 2022-09-14 ENCOUNTER — Other Ambulatory Visit: Payer: Self-pay | Admitting: Family Medicine

## 2022-09-14 DIAGNOSIS — E042 Nontoxic multinodular goiter: Secondary | ICD-10-CM

## 2022-09-15 ENCOUNTER — Telehealth: Payer: Self-pay

## 2022-09-15 NOTE — Telephone Encounter (Signed)
     Patient  visit on 4/13  at [facility]   Have you been able to follow up with your primary care physician? Yes   The patient was or was not able to obtain any needed medicine or equipment.yes   Are there diet recommendations that you are having difficulty following? Na   Patient expresses understanding of discharge instructions and education provided has no other needs at this time.  Yes     [name] [facility] Guide, [facility] [phone] [address] [place], [place], [place] [address] Phone: [phone] Email: [email or web address]

## 2022-09-15 NOTE — Progress Notes (Unsigned)
[address]       [place] [address]             [phone]    Billy Rasmussen [id number] 1945-11-07  History of Present Illness:  Billy Rasmussen is a 77 yo male with known history of Hypothyroidsm, HTN, H/O Bladder Cancer, Fibromyalgia, OSA, and Ascending Aortic Aneurysm.  He has been referred for routine surgical surveillance.  Of note he was in the Emergency Department on 09/12/2022 with complaints of chest pain.  Workup showed a new LBBB on EKG, however Troponin levels were normal.  He obtained relief of symptoms with GI cocktail and was ultimately discharged home with instructions to follow up with Cardiology.  The patient's last Echocardiogram was performed in 2020 which showed aneurysmal dilation of 45 mm and 41 mm at Aortic Root.  He did not have any evidence of valvular abnormality at that time.  The patient recently underwent a CT scan obtained by his Primary Care physician after he presented with complaints of fatigue and dyspnea on exertion.  He is awaiting evaluation by Cardiology however this isn't until May/June.  The patient states he has never smoked.  He does experience what he calls chest pressure activity.  He has been trying to pinpoint exactly which activity causes the symptoms.  He notes that his thyroid ultrasound was abnormal and he has to have a thyroid biopsy.  He is compliant with this blood pressure medications.  Current Outpatient Medications on File Prior to Visit  Medication Sig Dispense Refill   fluticasone (FLONASE) 50 MCG/ACT nasal spray Place 2 sprays into both nostrils daily. 16 g 6   levothyroxine (SYNTHROID) 25 MCG tablet Take 1 tablet (25 mcg total) by mouth daily before breakfast. Take on an empty stomach with water only.  Wait to consume other foods/ drinks/ meds for 30 minutes. 90 tablet 3   lisinopril (ZESTRIL) 5 MG tablet Take 1 tablet (5 mg total) by mouth daily. 90 tablet 0   meclizine (ANTIVERT) 25 MG tablet      polyvinyl  alcohol (LIQUIFILM TEARS) 1.4 % ophthalmic solution Place 1 drop into both eyes as needed for dry eyes. 15 mL 0   Multiple Vitamin (MULTIVITAMIN ADULT) TABS Take 1 tablet by mouth daily. (Patient not taking: Reported on 09/17/2022)     No current facility-administered medications on file prior to visit.   BP 122/73 (BP Location: Right Arm, Patient Position: Sitting, Cuff Size: Normal)   Pulse 80   Resp 20   Ht  (1.676 m)   Wt 217 lb (98.4 kg)   SpO2 96% Comment: RA  BMI 35.02 kg/m   Physical Exam  Gen: NAD Heart: RRR Lungs: CTA bilaterally Neck: No carotid bruit Ext: trace edema Neuro: grossly intact  CT Chest:  Narrative & Impression  CLINICAL DATA:  Dyspnea   EXAM: CT CHEST WITHOUT CONTRAST   TECHNIQUE: Multidetector CT imaging of the chest was performed following the standard protocol without IV contrast.   RADIATION DOSE REDUCTION: This exam was performed according to the departmental dose-optimization program which includes automated exposure control, adjustment of the mA and/or kV according to patient size and/or use of iterative reconstruction technique.   COMPARISON:  None Available.   FINDINGS: Cardiovascular: Ascending aortic aneurysm 4.5 cm. No pericardial effusion or cardiomegaly. Atheromatous calcifications aorta and coronary arteries.   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 Correlation with outpatient thyroid ultrasound recommended. No suspicious adenopathy identified. Unremarkable tracheobronchial tree  and esophagus.   Lungs/Pleura: Lungs are clear. No pleural effusion or pneumothorax.   Upper Abdomen: Gallbladder filled with calcified stones.   Musculoskeletal: Mild bilateral gynecomastia. No acute osseous abnormalities. There are thoracic degenerative changes. There is compression deformity at T11-L1 and most severely T6 that are age-indeterminate findings. The chronicity of these findings could be assessed with an MRI if it  would be helpful clinically.   IMPRESSION: 1. Aneurysmal ascending thoracic aorta, 4.5 cm. 2. Otherwise no acute cardiopulmonary process. 3. 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 Ultrasound correlation recommended. 4. Cholelithiasis. 5. Multilevel thoracic degenerative changes and compression deformities that are age-indeterminate. 6. Minimal bilateral gynecomastia.     Electronically Signed   By: Layla Maw M.D.   On: 09/02/2022 11:29    A/P:  Ascending Aortic Aneurysm, measuring 4.5 and after further review of the patient's records this has been present since 2020 being noted on Echocardiogram by Dr. Tresa Endo.Marland Kitchen overall appears to have been lost in follow up Chest pain, dyspnea- new complaints per patient, presented to ED where workup was ultimately negative.. the patient is awaiting appointment with Cardiology Thyroid Goiter- further workup per primary, per patient needs biopsy HTN-appears well controlled on Lisionpril RTC in 6 months with repeat CT chest... he has baseline CKD with elevated creatinine, will avoid dye study   Risk Modification:  Statin:  most recent panel shows no HLD, will defer statin therapy to PCP  Smoking cessation instruction/counseling given:  Never smoker  Patient was counseled on importance of Blood Pressure Control.  Despite Medical intervention if the patient notices persistently elevated blood pressure readings.  They are instructed to contact their Primary Care Physician  Please avoid use of Fluoroquinolones as this can potentially increase your risk of Aortic Rupture and/or Dissection  Patient educated on signs and symptoms of Aortic Dissection, handout also provided in AVS  Ajwa Kimberley, PA-C 09/17/22

## 2022-09-16 ENCOUNTER — Encounter: Payer: Self-pay | Admitting: Family Medicine

## 2022-09-16 ENCOUNTER — Ambulatory Visit (INDEPENDENT_AMBULATORY_CARE_PROVIDER_SITE_OTHER): Payer: Medicare HMO | Admitting: Family Medicine

## 2022-09-16 VITALS — BP 127/75 | HR 78 | Temp 98.7°F | Ht 66.0 in | Wt 214.0 lb

## 2022-09-16 DIAGNOSIS — Z0000 Encounter for general adult medical examination without abnormal findings: Secondary | ICD-10-CM

## 2022-09-16 DIAGNOSIS — I7121 Aneurysm of the ascending aorta, without rupture: Secondary | ICD-10-CM | POA: Diagnosis not present

## 2022-09-16 DIAGNOSIS — E042 Nontoxic multinodular goiter: Secondary | ICD-10-CM | POA: Diagnosis not present

## 2022-09-16 DIAGNOSIS — Z9079 Acquired absence of other genital organ(s): Secondary | ICD-10-CM | POA: Diagnosis not present

## 2022-09-16 DIAGNOSIS — G4733 Obstructive sleep apnea (adult) (pediatric): Secondary | ICD-10-CM

## 2022-09-16 DIAGNOSIS — G4734 Idiopathic sleep related nonobstructive alveolar hypoventilation: Secondary | ICD-10-CM

## 2022-09-16 DIAGNOSIS — Z906 Acquired absence of other parts of urinary tract: Secondary | ICD-10-CM

## 2022-09-16 DIAGNOSIS — D1721 Benign lipomatous neoplasm of skin and subcutaneous tissue of right arm: Secondary | ICD-10-CM

## 2022-09-16 DIAGNOSIS — Z0001 Encounter for general adult medical examination with abnormal findings: Secondary | ICD-10-CM | POA: Diagnosis not present

## 2022-09-16 DIAGNOSIS — E038 Other specified hypothyroidism: Secondary | ICD-10-CM | POA: Diagnosis not present

## 2022-09-16 DIAGNOSIS — Z125 Encounter for screening for malignant neoplasm of prostate: Secondary | ICD-10-CM

## 2022-09-16 DIAGNOSIS — D696 Thrombocytopenia, unspecified: Secondary | ICD-10-CM | POA: Diagnosis not present

## 2022-09-16 LAB — IRON,TIBC AND FERRITIN PANEL

## 2022-09-16 LAB — CBC
Hematocrit: 43.1 % (ref 37.5–51.0)
MCH: 30.4 pg (ref 26.6–33.0)
MCHC: 33.6 g/dL (ref 31.5–35.7)
MCV: 90 fL (ref 79–97)
RDW: 13.7 % (ref 11.6–15.4)
WBC: 6.3 10*3/uL (ref 3.4–10.8)

## 2022-09-16 LAB — PSA

## 2022-09-16 LAB — T4, FREE

## 2022-09-16 NOTE — Progress Notes (Unsigned)
Billy Rasmussen is a 77 y.o. male presents to office today for annual physical exam examination.    Concerns today include: 1.  Sleep apnea Patient reports that he has not received his new oxygen for nocturnal hypoxia but does admit that he is supposed to be on a CPAP machine.  He was not able to tolerate his old CPAP machine.  Last sleep study was many years ago and he would like to get this rechecked because he knows that this can impact his heart in a negative way.  He is ready to get put back on a CPAP  Occupation: Retired, Marital status: Married Clinical cytogeneticist  Diet: Secretary/administrator, Exercise: no structured Last eye exam: UTD Last dental exam: UTD Last colonoscopy: UTD Refills needed today: none Immunizations needed: Immunization History  Administered Date(s) Administered   Fluad Quad(high Dose 65+) 02/08/2019, 04/10/2020, 02/16/2022   Influenza, High Dose Seasonal PF 04/15/2017, 04/16/2021   Influenza, Seasonal, Injecte, Preservative Fre 02/28/2009   Influenza,inj,Quad PF,6+ Mos 02/25/2016, 03/01/2018   Moderna Sars-Covid-2 Vaccination 11/14/2019, 12/12/2019   Pneumococcal Conjugate-13 08/21/2019   Pneumococcal Polysaccharide-23 12/27/2017   Td (Adult),5 Lf Tetanus Toxid, Preservative Free 08/15/2020   Tdap 08/15/2020   Zoster Recombinat (Shingrix) 09/19/2021, 04/21/2022     Past Medical History:  Diagnosis Date   Arthritis    knees, shoulders, elbows   Bladder cancer [facility])    urologist-  dr eskridge   Diverticulosis of colon    Fibromyalgia    GERD (gastroesophageal reflux disease)    History of diverticulitis of colon    History of gastric ulcer    due to aleve   Hypertension    Lower urinary tract symptoms (LUTS)    OSA (obstructive sleep apnea)    NON-COMPLIANT  CPAP  --- BUT PT USES OXYGEN AT NIGHT 2.5L VIA [place] (PT'S DECISION)   PONV (postoperative nausea and vomiting)    Psoriasis    Sepsis (HCC)    january 2021   Tinnitus    right ear more, has tranmitter in  right ear removable at hs   Wears glasses    Wears partial dentures    Social History   Socioeconomic History   Marital status: Married    Spouse name: Clinical cytogeneticist   Number of children: 2   Years of education: 14   Highest education level: Associate degree: occupational, Scientist, product/process development, or vocational program  Occupational History   Occupation: retired    Comment: [employer], 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   Tobacco Use   Smoking status: Former    Packs/day: 2.00    Years: 40.00    Additional pack years: 0.00    Total pack years: 80.00    Types: Cigarettes    Quit date: 06/01/1997    Years since quitting: 25.3   Smokeless tobacco: Never  Vaping Use   Vaping Use: Never used  Substance and Sexual Activity   Alcohol use: No   Drug use: No   Sexual activity: Not Currently  Other Topics Concern   Not on file  Social History Narrative   One level home with wife    71/5831 - 42 year old MIL lives with them   Social Determinants of Health   Financial Resource Strain: Low Risk  (08/14/2022)   Overall Financial Resource Strain (CARDIA)    Difficulty of Paying Living Expenses: Not hard at all  Food Insecurity: No Food Insecurity (08/14/2022)   Hunger Vital Sign    Worried About Running Out of Food in the  Last Year: Never true    Ran Out of Food in the Last Year: Never true  Transportation Needs: No Transportation Needs (08/14/2022)   PRAPARE - Administrator, Civil Service (Medical): No    Lack of Transportation (Non-Medical): No  Physical Activity: Inactive (08/14/2022)   Exercise Vital Sign    Days of Exercise per Week: 0 days    Minutes of Exercise per Session: 0 min  Stress: No Stress Concern Present (08/14/2022)   [employer] of Occupational Health - Occupational Stress Questionnaire    Feeling of Stress : Not at all  Social Connections: Moderately Integrated (08/14/2022)   Social Connection and Isolation Panel [NHANES]    Frequency of Communication with Friends and Family: More than  three times a week    Frequency of Social Gatherings with Friends and Family: More than three times a week    Attends Religious Services: More than 4 times per year    Active Member of [employer] or Organizations: No    Attends Banker Meetings: Never    Marital Status: Married  Catering manager Violence: Not At Risk (08/14/2022)   Humiliation, Afraid, Rape, and Kick questionnaire    Fear of Current or Ex-Partner: No    Emotionally Abused: No    Physically Abused: No    Sexually Abused: No   Past Surgical History:  Procedure Laterality Date   APPENDECTOMY     COLECTOMY W/ COLOSTOMY  1996   W/   APPENDECTOMY   COLONOSCOPY N/A 05/11/2018   Procedure: COLONOSCOPY;  Surgeon: Corbin Ade, MD;  [facility];  Service: Endoscopy;  Laterality: N/A;  9:30   COLOSTOMY TAKEDOWN  1996   CYSTOSCOPY WITH BIOPSY N/A 12/05/2013   Procedure: CYSTO BLADDER BIOPSY AND FULGERATION;  Surgeon: Jerilee Field, MD;  [facility];  Service: Urology;  Laterality: N/A;   CYSTOSCOPY WITH BIOPSY Bilateral 11/13/2014   Procedure: CYSTOSCOPY WITH  BLADDER BIOPSY FULGERATION AND BILATERAL RETROGRADE PYELOGRAMS;  Surgeon: Jerilee Field, MD;  Location: [facility];  Service: Urology;  Laterality: Bilateral;   CYSTOSCOPY WITH FULGERATION N/A 01/18/2018   Procedure: CYSTOSCOPY WITH FULGERATION/ BLADDER BIOPSY;  Surgeon: Jerilee Field, MD;  Location: [facility];  Service: Urology;  Laterality: N/A;   CYSTOSCOPY WITH INJECTION N/A 11/09/2018   Procedure: CYSTOSCOPY WITH INJECTION OF INDOCYANINE GREEN DYE;  Surgeon: Sebastian Ache, MD;  Location: [facility];  Service: Urology;  Laterality: N/A;   CYSTOSCOPY WITH INSERTION OF UROLIFT N/A 01/18/2018   Procedure: CYSTOSCOPY WITH INSERTION OF UROLIFT;  Surgeon: Jerilee Field, MD;  Location[facility];  Service: Urology;  Laterality: N/A;   CYSTOSCOPY/URETEROSCOPY/HOLMIUM LASER Left  02/17/2019   Procedure: URETEROSCOPY WITH BALLOON DILATION  AND NEPHROSTOGRAM;  Surgeon: Sebastian Ache, MD;  Location: [facility];  Service: Urology;  Laterality: Left;  1 HR   EXCISION RIGHT UPPER ARM LIPOMA  2005   HEMORROIDECTOMY     INGUINAL HERNIA REPAIR Left 1984   IR NEPHROSTOMY PLACEMENT LEFT  01/05/2019   KNEE ARTHROSCOPY Left X3  LAST ONE  2002   ORIF LEFT HUMEROUS FX  1976   POLYPECTOMY  05/11/2018   Procedure: POLYPECTOMY;  Surgeon: Corbin Ade, MD;  [facility];  Service: Endoscopy;;   PROSTATE SURGERY     ROBOT ASSISTED LAPAROSCOPIC NEPHRECTOMY Left 07/14/2019   Procedure: XI ROBOTIC ASSISTED LAPAROSCOPIC RETROPERITONEAL NEPHRECTOMY;  Surgeon: Sebastian Ache, MD;  Location: [facility];  Service: Urology;  Laterality: Left;  3 HRS   TONSILLECTOMY  AS CHILD   TOTAL KNEE ARTHROPLASTY Left 2006   REVISION 2007  (AFTER I & D WITH ANTIBIOTIC SPACER PROCEDURE FOR STEPH INFECTION)   TOTAL KNEE ARTHROPLASTY Right 03/30/2016   Procedure: RIGHT TOTAL KNEE ARTHROPLASTY;  Surgeon: Durene Romans, MD;  Location: [facility];  Service: Orthopedics;  Laterality: Right;   TOTAL SHOULDER ARTHROPLASTY Right 04/06/2013   Procedure: RIGHT TOTAL SHOULDER ARTHROPLASTY;  Surgeon: Senaida Lange, MD;  Location: [facility];  Service: Orthopedics;  Laterality: Right;   TOTAL SHOULDER ARTHROPLASTY Left 07/20/2013   Procedure: LEFT TOTAL SHOULDER ARTHROPLASTY;  Surgeon: Senaida Lange, MD;  Location: [facility];  Service: Orthopedics;  Laterality: Left;   TRANSURETHRAL RESECTION OF BLADDER TUMOR N/A 11/12/2015   Procedure: TRANSURETHRAL RESECTION OF BLADDER TUMOR (TURBT);  Surgeon: Jerilee Field, MD;  Location: [facility];  Service: Urology;  Laterality: N/A;   TRANSURETHRAL RESECTION OF BLADDER TUMOR WITH GYRUS (TURBT-GYRUS) N/A 12/05/2013   Procedure: TRANSURETHRAL RESECTION OF BLADDER TUMOR WITH GYRUS (TURBT-GYRUS);  Surgeon: Jerilee Field, MD;  Location: [facility];  Service: Urology;  Laterality: N/A;   Family History  Problem Relation Age of Onset   Alzheimer's disease Mother    Heart attack Father    Heart disease Father    Irregular heart beat Brother        DEFIB.   Congestive Heart Failure Brother    Diabetes Daughter     Current Outpatient Medications:    levothyroxine (SYNTHROID) 25 MCG tablet, Take 1 tablet (25 mcg total) by mouth daily before breakfast. Take on an empty stomach with water only.  Wait to consume other foods/ drinks/ meds for 30 minutes., Disp: 90 tablet, Rfl: 3   fluticasone (FLONASE) 50 MCG/ACT nasal spray, Place 2 sprays into both nostrils daily., Disp: 16 g, Rfl: 6   lisinopril (ZESTRIL) 5 MG tablet, Take 1 tablet (5 mg total) by mouth daily., Disp: 90 tablet, Rfl: 0   meclizine (ANTIVERT) 25 MG tablet, , Disp: , Rfl:    Multiple Vitamin (MULTIVITAMIN ADULT) TABS, Take 1 tablet by mouth daily., Disp: , Rfl:    polyvinyl alcohol (LIQUIFILM TEARS) 1.4 % ophthalmic solution, Place 1 drop into both eyes as needed for dry eyes., Disp: 15 mL, Rfl: 0  No Known Allergies   ROS: Review of Systems A comprehensive review of systems was negative except for: Eyes: positive for contacts/glasses Genitourinary: positive for RLQ orostomy Musculoskeletal: positive for lipoma RUE elbow    Physical exam BP 127/75   Pulse 78   Temp 98.7 F (37.1 C)   Ht 5\' 6"  (1.676 m)   Wt 214 lb (97.1 kg)   SpO2 96%   BMI 34.54 kg/m  General appearance: alert, cooperative, appears stated age, no distress, and moderately obese Head: Normocephalic, without obvious abnormality, atraumatic Eyes: negative findings: lids and lashes normal, conjunctivae and sclerae normal, corneas clear, and pupils equal, round, reactive to light and accomodation Ears: normal TM's and external ear canals both ears Nose: Nares normal. Septum midline. Mucosa normal. No drainage or sinus tenderness. Throat: lips, mucosa, and tongue normal; teeth and gums  normal Neck: no adenopathy, no carotid bruit, supple, symmetrical, trachea midline, and no goiter appreciated Back: symmetric, no curvature. ROM normal. No CVA tenderness. Lungs: clear to auscultation bilaterally Chest wall: no tenderness Heart: regular rate and rhythm, S1, S2 normal, no murmur, click, rub or gallop Abdomen:  obese, nontender, RLQ  orostomy present with clear urine in bag.  Extremities: extremities normal, atraumatic, no cyanosis or edema Pulses: 2+ and symmetric Skin: Skin color, texture, turgor normal. No rashes or lesions Lymph nodes: Cervical, supraclavicular, and axillary nodes normal. Neurologic: Grossly normal Psych: very pleasant, interactive MSK: golfball sized soft tissue mass medial to the right elbow.     09/16/2022    9:06 AM 08/21/2022    3:23 PM 08/14/2022    9:19 AM  Depression screen PHQ 2/9  Decreased Interest 0 0 0  Down, Depressed, Hopeless 0 0 0  PHQ - 2 Score 0 0 0  Altered sleeping 0 0 0  Tired, decreased energy 0 0 0  Change in appetite 0 0 0  Feeling bad or failure about yourself  0 0 0  Trouble concentrating 0 0 0  Moving slowly or fidgety/restless 0 0 0  Suicidal thoughts 0 0 0  PHQ-9 Score 0 0 0  Difficult doing work/chores Not difficult at all Not difficult at all Not difficult at all      09/16/2022    9:06 AM 08/21/2022    3:23 PM 07/30/2022    9:39 AM 09/19/2021    9:29 AM  GAD 7 : Generalized Anxiety Score  Nervous, Anxious, on Edge 0 0 0 0  Control/stop worrying 0 0 0 0  Worry too much - different things 0 0 0 0  Trouble relaxing 0 0 0 0  Restless 0 0 0 0  Easily annoyed or irritable 0 0 1 0  Afraid - awful might happen 0 0 0 0  Total GAD 7 Score 0 0 1 0  Anxiety Difficulty Not difficult at all Not difficult at all Somewhat difficult Not difficult at all   Assessment/ Plan: [name] here for annual physical exam.   Annual physical exam  Screening for malignant neoplasm of prostate - Plan: PSA  Multiple  thyroid nodules  Aneurysm of ascending aorta without rupture  Thrombocytopenia - Plan: CBC, Iron, TIBC and Ferritin Panel  Subclinical hypothyroidism - Plan: T4, free, TSH  OSA (obstructive sleep apnea) - Plan: Ambulatory referral to Sleep Studies  Nocturnal hypoxia - Plan: Ambulatory referral to Sleep Studies  Lipoma of right upper extremity  Check labs.    Thyroid biopsy pending.  Check thyroid labs  Has visit with CTS this week for eval of AAA  Referral to sleep medicine for OSA, new CPAP.  Likely needs bleed in O2 as he simply has been on supplemental O2 at night currently.  No intervention for RUE lipoma at this time. Asymptomatic.   Counseled on healthy lifestyle choices, including diet (rich in fruits, vegetables and lean meats and low in salt and simple carbohydrates) and exercise (at least 30 minutes of moderate physical activity daily).  Kienan Doublin M. Nadine Counts, DO

## 2022-09-17 ENCOUNTER — Institutional Professional Consult (permissible substitution): Payer: Medicare HMO | Admitting: Physician Assistant

## 2022-09-17 VITALS — BP 122/73 | HR 80 | Resp 20 | Ht 66.0 in | Wt 217.0 lb

## 2022-09-17 DIAGNOSIS — I7121 Aneurysm of the ascending aorta, without rupture: Secondary | ICD-10-CM | POA: Diagnosis not present

## 2022-09-17 LAB — CBC
Hemoglobin: 14.5 g/dL (ref 13.0–17.7)
Platelets: 121 10*3/uL — ABNORMAL LOW (ref 150–450)
RBC: 4.77 x10E6/uL (ref 4.14–5.80)

## 2022-09-17 LAB — IRON,TIBC AND FERRITIN PANEL
Iron Saturation: 32 % (ref 15–55)
Iron: 90 ug/dL (ref 38–169)
Total Iron Binding Capacity: 283 ug/dL (ref 250–450)
UIBC: 193 ug/dL (ref 111–343)

## 2022-09-17 LAB — TSH: TSH: 2.09 u[IU]/mL (ref 0.450–4.500)

## 2022-09-18 ENCOUNTER — Other Ambulatory Visit: Payer: Self-pay

## 2022-09-21 ENCOUNTER — Encounter: Payer: Medicare Other | Admitting: Family Medicine

## 2022-09-23 DIAGNOSIS — Z8551 Personal history of malignant neoplasm of bladder: Secondary | ICD-10-CM | POA: Diagnosis not present

## 2022-09-23 DIAGNOSIS — Z936 Other artificial openings of urinary tract status: Secondary | ICD-10-CM | POA: Diagnosis not present

## 2022-09-24 ENCOUNTER — Encounter: Payer: Medicare HMO | Admitting: Cardiothoracic Surgery

## 2022-09-24 ENCOUNTER — Telehealth: Payer: Self-pay

## 2022-09-24 NOTE — Telephone Encounter (Signed)
Patient wants to check with Billy Rasmussen to find out what update she may have about getting his oxygen.  He said she had faxed some information to help him get it after his insurance changed and he would like her to give him an update when she gets back.

## 2022-09-27 ENCOUNTER — Emergency Department (HOSPITAL_COMMUNITY)
Admission: EM | Admit: 2022-09-27 | Discharge: 2022-09-27 | Disposition: A | Discharge status: Home / Self Care | Payer: Medicare HMO | Attending: Emergency Medicine | Admitting: Emergency Medicine

## 2022-09-27 ENCOUNTER — Other Ambulatory Visit: Payer: Self-pay

## 2022-09-27 ENCOUNTER — Encounter (HOSPITAL_COMMUNITY): Payer: Self-pay | Admitting: Emergency Medicine

## 2022-09-27 ENCOUNTER — Emergency Department (HOSPITAL_COMMUNITY): Payer: Medicare HMO

## 2022-09-27 DIAGNOSIS — N289 Disorder of kidney and ureter, unspecified: Secondary | ICD-10-CM | POA: Insufficient documentation

## 2022-09-27 DIAGNOSIS — I110 Hypertensive heart disease with heart failure: Secondary | ICD-10-CM | POA: Diagnosis not present

## 2022-09-27 DIAGNOSIS — R778 Other specified abnormalities of plasma proteins: Secondary | ICD-10-CM | POA: Diagnosis not present

## 2022-09-27 DIAGNOSIS — I509 Heart failure, unspecified: Secondary | ICD-10-CM | POA: Diagnosis not present

## 2022-09-27 DIAGNOSIS — Z79899 Other long term (current) drug therapy: Secondary | ICD-10-CM | POA: Diagnosis not present

## 2022-09-27 DIAGNOSIS — Z8551 Personal history of malignant neoplasm of bladder: Secondary | ICD-10-CM | POA: Insufficient documentation

## 2022-09-27 DIAGNOSIS — R0602 Shortness of breath: Secondary | ICD-10-CM | POA: Diagnosis not present

## 2022-09-27 DIAGNOSIS — R7989 Other specified abnormal findings of blood chemistry: Secondary | ICD-10-CM

## 2022-09-27 LAB — CBC WITH DIFFERENTIAL/PLATELET
Abs Immature Granulocytes: 0.03 10*3/uL (ref 0.00–0.07)
Basophils Absolute: 0 10*3/uL (ref 0.0–0.1)
Basophils Relative: 0 %
Eosinophils Absolute: 0.2 10*3/uL (ref 0.0–0.5)
Eosinophils Relative: 4 %
HCT: 42.1 % (ref 39.0–52.0)
Hemoglobin: 13.9 g/dL (ref 13.0–17.0)
Immature Granulocytes: 1 %
Lymphocytes Relative: 23 %
Lymphs Abs: 1.2 10*3/uL (ref 0.7–4.0)
MCH: 30.3 pg (ref 26.0–34.0)
MCHC: 33 g/dL (ref 30.0–36.0)
MCV: 91.9 fL (ref 80.0–100.0)
Monocytes Absolute: 0.5 10*3/uL (ref 0.1–1.0)
Monocytes Relative: 9 %
Neutro Abs: 3.4 10*3/uL (ref 1.7–7.7)
Neutrophils Relative %: 63 %
Platelets: 138 10*3/uL — ABNORMAL LOW (ref 150–400)
RBC: 4.58 MIL/uL (ref 4.22–5.81)
RDW: 14.4 % (ref 11.5–15.5)
WBC: 5.3 10*3/uL (ref 4.0–10.5)
nRBC: 0 % (ref 0.0–0.2)

## 2022-09-27 LAB — BASIC METABOLIC PANEL
Anion gap: 7 (ref 5–15)
BUN: 23 mg/dL (ref 8–23)
CO2: 25 mmol/L (ref 22–32)
Calcium: 9.7 mg/dL (ref 8.9–10.3)
Chloride: 103 mmol/L (ref 98–111)
Creatinine, Ser: 1.39 mg/dL — ABNORMAL HIGH (ref 0.61–1.24)
GFR, Estimated: 53 mL/min — ABNORMAL LOW (ref >60)
Glucose, Bld: 92 mg/dL (ref 70–99)
Potassium: 4.2 mmol/L (ref 3.5–5.1)
Sodium: 135 mmol/L (ref 135–145)

## 2022-09-27 LAB — D-DIMER, QUANTITATIVE: D-Dimer, Quant: 0.62 ug/mL-FEU — ABNORMAL HIGH (ref 0.00–0.50)

## 2022-09-27 LAB — TROPONIN I (HIGH SENSITIVITY)
Troponin I (High Sensitivity): 27 ng/L — ABNORMAL HIGH (ref <18)
Troponin I (High Sensitivity): 25 ng/L — ABNORMAL HIGH (ref <18)

## 2022-09-27 LAB — MAGNESIUM: Magnesium: 2 mg/dL (ref 1.7–2.4)

## 2022-09-27 LAB — BRAIN NATRIURETIC PEPTIDE: B Natriuretic Peptide: 245 pg/mL — ABNORMAL HIGH (ref 0.0–100.0)

## 2022-09-27 MED ORDER — ASPIRIN 81 MG PO CHEW
324.0000 mg | CHEWABLE_TABLET | Freq: Once | ORAL | Status: AC
  Administered 2022-09-27: 162 mg via ORAL
  Filled 2022-09-27: qty 4

## 2022-09-27 MED ORDER — IOHEXOL 350 MG/ML SOLN
75.0000 mL | Freq: Once | INTRAVENOUS | Status: AC | PRN
  Administered 2022-09-27: 75 mL via INTRAVENOUS

## 2022-09-27 MED ORDER — FUROSEMIDE 10 MG/ML IJ SOLN
40.0000 mg | Freq: Once | INTRAMUSCULAR | Status: AC
  Administered 2022-09-27: 40 mg via INTRAVENOUS
  Filled 2022-09-27: qty 4

## 2022-09-27 MED ORDER — FUROSEMIDE 20 MG PO TABS: 20.0000 mg | ORAL_TABLET | Freq: Every day | ORAL | 0 refills | Status: DC

## 2022-09-27 NOTE — ED Notes (Signed)
Patient returned from CT

## 2022-09-27 NOTE — ED Provider Notes (Signed)
[place] EMERGENCY DEPARTMENT AT [facility] Provider Note   CSN: [id number] Arrival date & time: 09/27/22  0241     History  Chief Complaint  Patient presents with   Shortness of Breath    Billy Rasmussen is a 77 y.o. male.  The history is provided by the patient.  Shortness of Breath He has history of hypertension, GERD, bladder cancer, aneurysm of ascending aorta and comes in because of difficulty breathing and chest and abdomen discomfort.  For about the last month, he has had a tight feeling in his abdomen and chest and a sense of inability to get a deep breath.  Dyspnea is exertional but it has been stable.  He tends to have more symptoms at night and tonight he was not able to get comfortable.  There is no radiation of symptoms to the back, neck, jaw, arm.  He has had episodes of diaphoresis but not tonight.  He denies nausea or vomiting.  He had been seen in the emergency department for this complaint 2 weeks ago.  He is a non-smoker and denies history of diabetes or hyperlipidemia.   Home Medications Prior to Admission medications   Medication Sig Start Date End Date Taking? Authorizing Provider  fluticasone (FLONASE) 50 MCG/ACT nasal spray Place 2 sprays into both nostrils daily. 07/30/22   Gabriel Earing, [name]  levothyroxine (SYNTHROID) 25 MCG tablet Take 1 tablet (25 mcg total) by mouth daily before breakfast. Take on an empty stomach with water only.  Wait to consume other foods/ drinks/ meds for 30 minutes. 08/24/22   Raliegh Ip, DO  lisinopril (ZESTRIL) 5 MG tablet Take 1 tablet (5 mg total) by mouth daily. 07/31/22   Raliegh Ip, DO  meclizine (ANTIVERT) 25 MG tablet     [provider]  Multiple Vitamin (MULTIVITAMIN ADULT) TABS Take 1 tablet by mouth daily. Patient not taking: Reported on 09/17/2022    [provider]  polyvinyl alcohol (LIQUIFILM TEARS) 1.4 % ophthalmic solution Place 1 drop into both eyes as needed for dry  eyes. 05/16/19   Saundra Shelling, MD      Allergies    Patient has no known allergies.    Review of Systems   Review of Systems  Respiratory:  Positive for shortness of breath.   All other systems reviewed and are negative.   Physical Exam Updated Vital Signs BP (!) 141/94 (BP Location: Left Arm)   Pulse 87   Resp 19   Ht 5\' 6"  (1.676 m)   Wt 98.4 kg   SpO2 98%   BMI 35.02 kg/m  Physical Exam Vitals and nursing note reviewed.   78 year old male, resting comfortably and in no acute distress. Vital signs are significant for mildly elevated blood pressure. Oxygen saturation is 98%, which is normal. Head is normocephalic and atraumatic. PERRLA, EOMI. Oropharynx is clear. Neck is nontender and supple without adenopathy or JVD. Back is nontender and there is no CVA tenderness. Lungs are clear without rales, wheezes, or rhonchi. Chest is nontender. Heart has regular rate and rhythm without murmur. Abdomen is soft, flat, nontender.  Ileal conduit present right lower quadrant. Extremities have no cyanosis or edema, full range of motion is present. Skin is warm and dry without rash. Neurologic: Mental status is normal, cranial nerves are intact, moves all extremities equally.  ED Results / Procedures / Treatments   Labs (all labs ordered are listed, but only abnormal results are displayed) Labs  Reviewed  D-DIMER, QUANTITATIVE - Abnormal; Notable for the following components:      Result Value   D-Dimer, Quant 0.62 (*)    All other components within normal limits  CBC WITH DIFFERENTIAL/PLATELET - Abnormal; Notable for the following components:   Platelets 138 (*)    All other components within normal limits  BASIC METABOLIC PANEL - Abnormal; Notable for the following components:   Creatinine, Ser 1.39 (*)    GFR, Estimated 53 (*)    All other components within normal limits  BRAIN NATRIURETIC PEPTIDE - Abnormal; Notable for the following components:   B Natriuretic Peptide  245.0 (*)    All other components within normal limits  TROPONIN I (HIGH SENSITIVITY) - Abnormal; Notable for the following components:   Troponin I (High Sensitivity) 27 (*)    All other components within normal limits  MAGNESIUM  TROPONIN I (HIGH SENSITIVITY)    EKG EKG Interpretation  Date/Time:  Sunday September 27 2022 02:52:45 EDT Ventricular Rate:  82 PR Interval:  188 QRS Duration: 169 QT Interval:  441 QTC Calculation: 516 R Axis:   -77 Text Interpretation: Sinus rhythm Nonspecific IVCD with LAD LVH with secondary repolarization abnormality Anterior infarct, old Baseline wander in lead(s) V4 When compared with ECG of 4/13/20242, No significant change was found Confirmed by Dione Booze ([id number]) on 09/27/2022 2:58:24 AM  Radiology CT Angio Chest PE W and/or Wo Contrast  Result Date: 09/27/2022 CLINICAL DATA:  Pulmonary embolism suspected, low to intermediate probability, positive D-dimer. Increased shortness of breath. EXAM: CT ANGIOGRAPHY CHEST WITH CONTRAST TECHNIQUE: Multidetector CT imaging of the chest was performed using the standard protocol during bolus administration of intravenous contrast. Multiplanar CT image reconstructions and MIPs were obtained to evaluate the vascular anatomy. RADIATION DOSE REDUCTION: This exam was performed according to the departmental dose-optimization program which includes automated exposure control, adjustment of the mA and/or kV according to patient size and/or use of iterative reconstruction technique. CONTRAST:  75mL OMNIPAQUE IOHEXOL 350 MG/ML SOLN COMPARISON:  08/31/2022. FINDINGS: Cardiovascular: Heart is enlarged and there is no pericardial effusion. Multi-vessel coronary artery calcifications are noted. There is mild atherosclerotic calcification of the aorta with aneurysmal dilatation of the ascending aorta measuring 4.5 cm. The pulmonary trunk is distended suggesting underlying pulmonary artery hypertension. No evidence of pulmonary  embolism. Mediastinum/Nodes: Nonspecific prominent lymph nodes are noted in the mediastinum measuring up to 1 cm. There are enlarged lymph nodes at the right hilum measuring 1.4 cm. No axillary lymphadenopathy is seen. There is asymmetric enlargement of the left lobe of the thyroid gland with a nodule extending into the superior mediastinum on the left, unchanged. The trachea and esophagus are within normal limits. Lungs/Pleura: Interlobular septal thickening and ground-glass opacities are noted bilaterally. No effusion or pneumothorax. Upper Abdomen: Stones are present within the gallbladder. Left kidney is surgically absent. No acute abnormality. Musculoskeletal: Fixation hardware is noted at the shoulders bilaterally. The bony structures are stable. Review of the MIP images confirms the above findings. IMPRESSION: 1. No evidence of pulmonary embolism. 2. Interlobular septal thickening with ground-glass opacities bilaterally suggesting pulmonary edema, less likely infection. 3. 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 Ultrasound is recommended for further characterization on nonemergent follow-up. 4. Aortic atherosclerosis with aneurysmal dilatation of the ascending aorta measuring 4.5 cm. Ascending thoracic aortic aneurysm. Recommend semi-annual imaging followup by CTA or MRA and referral to cardiothoracic surgery if not already obtained. This recommendation follows 2010 ACCF/AHA/AATS/ACR/ASA/SCA/SCAI/SIR/STS/SVM Guidelines for the Diagnosis and Management of Patients With Thoracic  Aortic Disease. Circulation. 2010; 121: W098-J191. Aortic aneurysm NOS (ICD10-I71.9) 5. Coronary artery calcifications. 6. Cholelithiasis. Electronically Signed   By: Thornell Sartorius M.D.   On: 09/27/2022 04:44   DG Chest Port 1 View  Result Date: 09/27/2022 CLINICAL DATA:  Shortness of breath EXAM: PORTABLE CHEST 1 VIEW COMPARISON:  09/12/2022 FINDINGS: Cardiac shadow is enlarged. Lungs are well aerated bilaterally. Mild interstitial edema is  noted. No focal infiltrate is seen. Bilateral shoulder replacements are noted. IMPRESSION: Changes of mild interstitial edema. No other focal abnormality is seen. Electronically Signed   By: Alcide Clever M.D.   On: 09/27/2022 03:17    Procedures Procedures  Cardiac monitor shows normal sinus rhythm, per my interpretation.  Medications Ordered in ED Medications  aspirin chewable tablet 324 mg (162 mg Oral Given 09/27/22 0308)  iohexol (OMNIPAQUE) 350 MG/ML injection 75 mL (75 mLs Intravenous Contrast Given 09/27/22 0405)  furosemide (LASIX) injection 40 mg (40 mg Intravenous Given 09/27/22 0517)    ED Course/ Medical Decision Making/ A&P                             Medical Decision Making Amount and/or Complexity of Data Reviewed Labs: ordered. Radiology: ordered.  Risk OTC drugs. Prescription drug management.   Dyspnea and chest discomfort of uncertain cause.  Consider ACS, pulmonary embolism, heart failure.  I have reviewed and interpreted his electrocardiogram, and my interpretation is nonspecific intraventricular conduction delay, left ventricular hypertrophy with secondary repolarization abnormality, unchanged from recent ECG.  I have ordered aspirin, but patient has declined because he has only 1 kidney.  I have ordered workup including CBC, basic metabolic panel, BNP to rule out heart failure, D-dimer to rule out pulmonary embolism, troponin to rule out ACS.  I have reviewed his old records, and he was seen in the ED for similar symptoms on 09/12/2022.  He is also being followed for asymptomatic 4.5 cm aneurysm of the ascending aorta which apparently has been stable for the last 4 years.  Chest x-ray shows changes consistent with interstitial edema.  I have independently viewed the image, and agree with the radiologist's interpretation.  I have reviewed and interpreted his laboratory test, my interpretation is mildly elevated D-dimer, normal CBC, stable renal insufficiency, mildly  elevated troponin not significantly changed from 2 weeks ago, normal magnesium, mildly elevated BNP consistent with heart failure.  I have ordered a dose of furosemide.  Repeat troponin is pending.  He has had good diuresis from furosemide.  Repeat troponin is flat.  I suspect he has some degree of demand ischemia related to his heart failure to explain his recent troponin elevations.  I am discharging him with a prescription for furosemide and ambulatory referral to cardiology.  I have advised him to stay on a low-salt diet.  Final Clinical Impression(s) / ED Diagnoses Final diagnoses:  Heart failure, unspecified HF chronicity, unspecified heart failure type (HCC)  Renal insufficiency  Elevated troponin    Rx / DC Orders ED Discharge Orders          Ordered    furosemide (LASIX) 20 MG tablet  Daily        09/27/22 0722    Ambulatory referral to Cardiology       Comments: If you have not heard from the Cardiology office within the next 72 hours please call [phone].   09/27/22 0865  Dione Booze, MD 09/27/22 0730

## 2022-09-27 NOTE — ED Notes (Signed)
Patient transported to CT 

## 2022-09-27 NOTE — ED Triage Notes (Signed)
Pt c/o increased SOB off and on. Pt states he came in tonight due to not being able to sleep and he couldn't get comfortable.

## 2022-09-28 ENCOUNTER — Telehealth: Payer: Self-pay | Admitting: Family Medicine

## 2022-09-28 NOTE — Telephone Encounter (Signed)
Patient wife is aware they will need to call that office for the results.

## 2022-09-30 NOTE — Telephone Encounter (Signed)
Patient called to speak with [facility] regarding the denial of his CPAP.

## 2022-10-01 NOTE — Telephone Encounter (Signed)
Have you seen his overnight pulse ox results? So we can get his nightly oxygen.

## 2022-10-02 ENCOUNTER — Ambulatory Visit: Payer: Medicare Other | Admitting: Cardiovascular Disease

## 2022-10-02 NOTE — Telephone Encounter (Signed)
This was put on your desk just about 2 weeks ago.  Remember he was going for PSGM/ CPAP so we thought he might get O2 bled in?

## 2022-10-05 NOTE — Progress Notes (Unsigned)
Cardiology Office Note:    Date:  10/06/2022   ID:  Billy Rasmussen, DOB [birth year], MRN [id number]  PCP:  Raliegh Ip, DO  Cardiologist:  Nicki Guadalajara, MD  Electrophysiologist:  None   Referring MD: Raliegh Ip, DO   Chief Complaint  Patient presents with   Congestive Heart Failure    History of Present Illness:    Billy Rasmussen is a 77 y.o. male with a hx of bladder cancer s/p left nephrectomy, cystoprostatectomy , fibromyalgia, hypertension, OSA not on CPAP, psoriasis who presents as an ED follow-up for suspected heart failure.  He was seen in the ED on 09/27/2022 for shortness of breath.  Chest x-ray shows interstitial edema and mildly elevated BNP.  CTPA showed no PE but findings suggestive of pulmonary edema, as well as 4.5 cm ascending aortic aneurysm.  He was given Lasix with improvement and discharged on p.o. Lasix 20 mg daily with referral to cardiology.  Echocardiogram 04/2019 showed EF 55-60%, ascending aortic aneurysm measuring 45 mm, normal RV function, no significant valvular disease.  He reports he has been feeling short of breath for the last several months.  Also with some pressure in chest, had episode few weeks ago that lasted for several hours.  Reports he had fever last night to 101.8 F.  Reports feeling lightheaded today.   BP Readings from Last 3 Encounters:  10/06/22 (!) 109/48  10/06/22 96/62  09/27/22 (!) 131/91  \  Past Medical History:  Diagnosis Date   Arthritis    knees, shoulders, elbows   Bladder cancer [facility])    urologist-  dr eskridge   Diverticulosis of colon    Fibromyalgia    GERD (gastroesophageal reflux disease)    History of diverticulitis of colon    History of gastric ulcer    due to aleve   Hypertension    Lower urinary tract symptoms (LUTS)    OSA (obstructive sleep apnea)    NON-COMPLIANT  CPAP  --- BUT PT USES OXYGEN AT NIGHT 2.5L VIA  (PT'S DECISION)   PONV (postoperative nausea and vomiting)     Psoriasis    Sepsis (HCC)    january 2021   Tinnitus    right ear more, has tranmitter in right ear removable at hs   Wears glasses    Wears partial dentures     Past Surgical History:  Procedure Laterality Date   APPENDECTOMY     COLECTOMY W/ COLOSTOMY  1996   W/   APPENDECTOMY   COLONOSCOPY N/A 05/11/2018   Procedure: COLONOSCOPY;  Surgeon: Corbin Ade, MD;  [facility];  Service: Endoscopy;  Laterality: N/A;  9:30   COLOSTOMY TAKEDOWN  1996   CYSTOSCOPY WITH BIOPSY N/A 12/05/2013   Procedure: CYSTO BLADDER BIOPSY AND FULGERATION;  Surgeon: Jerilee Field, MD;  Location: [facility];  Service: Urology;  Laterality: N/A;   CYSTOSCOPY WITH BIOPSY Bilateral 11/13/2014   Procedure: CYSTOSCOPY WITH  BLADDER BIOPSY FULGERATION AND BILATERAL RETROGRADE PYELOGRAMS;  Surgeon: Jerilee Field, MD;  Location: [facility];  Service: Urology;  Laterality: Bilateral;   CYSTOSCOPY WITH FULGERATION N/A 01/18/2018   Procedure: CYSTOSCOPY WITH FULGERATION/ BLADDER BIOPSY;  Surgeon: Jerilee Field, MD;  Location: [facility];  Service: Urology;  Laterality: N/A;   CYSTOSCOPY WITH INJECTION N/A 11/09/2018   Procedure: CYSTOSCOPY WITH INJECTION OF INDOCYANINE GREEN DYE;  Surgeon: Sebastian Ache, MD;  Location: [facility];  Service: Urology;  Laterality: N/A;  CYSTOSCOPY WITH INSERTION OF UROLIFT N/A 01/18/2018   Procedure: CYSTOSCOPY WITH INSERTION OF UROLIFT;  Surgeon: Jerilee Field, MD;  Location: [facility];  Service: Urology;  Laterality: N/A;   CYSTOSCOPY/URETEROSCOPY/HOLMIUM LASER Left 02/17/2019   Procedure: URETEROSCOPY WITH BALLOON DILATION  AND NEPHROSTOGRAM;  Surgeon: Sebastian Ache, MD;  Location: [facility];  Service: Urology;  Laterality: Left;  1 HR   EXCISION RIGHT UPPER ARM LIPOMA  2005   HEMORROIDECTOMY     INGUINAL HERNIA REPAIR Left 1984   IR NEPHROSTOMY PLACEMENT LEFT  01/05/2019   KNEE  ARTHROSCOPY Left X3  LAST ONE  2002   ORIF LEFT HUMEROUS FX  1976   POLYPECTOMY  05/11/2018   Procedure: POLYPECTOMY;  Surgeon: Corbin Ade, MD;  [facility];  Service: Endoscopy;;   PROSTATE SURGERY     ROBOT ASSISTED LAPAROSCOPIC NEPHRECTOMY Left 07/14/2019   Procedure: XI ROBOTIC ASSISTED LAPAROSCOPIC RETROPERITONEAL NEPHRECTOMY;  Surgeon: Sebastian Ache, MD;  Location: [facility];  Service: Urology;  Laterality: Left;  3 HRS   TONSILLECTOMY  AS CHILD   TOTAL KNEE ARTHROPLASTY Left 2006   REVISION 2007  (AFTER I & D WITH ANTIBIOTIC SPACER PROCEDURE FOR STEPH INFECTION)   TOTAL KNEE ARTHROPLASTY Right 03/30/2016   Procedure: RIGHT TOTAL KNEE ARTHROPLASTY;  Surgeon: Durene Romans, MD;  Location: [facility];  Service: Orthopedics;  Laterality: Right;   TOTAL SHOULDER ARTHROPLASTY Right 04/06/2013   Procedure: RIGHT TOTAL SHOULDER ARTHROPLASTY;  Surgeon: Senaida Lange, MD;  Location: [facility];  Service: Orthopedics;  Laterality: Right;   TOTAL SHOULDER ARTHROPLASTY Left 07/20/2013   Procedure: LEFT TOTAL SHOULDER ARTHROPLASTY;  Surgeon: Senaida Lange, MD;  Location: [facility];  Service: Orthopedics;  Laterality: Left;   TRANSURETHRAL RESECTION OF BLADDER TUMOR N/A 11/12/2015   Procedure: TRANSURETHRAL RESECTION OF BLADDER TUMOR (TURBT);  Surgeon: Jerilee Field, MD;  Location: [facility];  Service: Urology;  Laterality: N/A;   TRANSURETHRAL RESECTION OF BLADDER TUMOR WITH GYRUS (TURBT-GYRUS) N/A 12/05/2013   Procedure: TRANSURETHRAL RESECTION OF BLADDER TUMOR WITH GYRUS (TURBT-GYRUS);  Surgeon: Jerilee Field, MD;  Location[facility]  Service: Urology;  Laterality: N/A;    Current Medications: Current Meds  Medication Sig   furosemide (LASIX) 20 MG tablet Take 1 tablet (20 mg total) by mouth daily.   levothyroxine (SYNTHROID) 25 MCG tablet Take 1 tablet (25 mcg total) by mouth daily before breakfast. Take on an empty stomach with water only.  Wait to  consume other foods/ drinks/ meds for 30 minutes.   lisinopril (ZESTRIL) 5 MG tablet Take 1 tablet (5 mg total) by mouth daily.   meclizine (ANTIVERT) 25 MG tablet    polyvinyl alcohol (LIQUIFILM TEARS) 1.4 % ophthalmic solution Place 1 drop into both eyes as needed for dry eyes.     Allergies:   Patient has no known allergies.   Social History   Socioeconomic History   Marital status: Married    Spouse name: Peggy   Number of children: 2   Years of education: 14   Highest education level: Associate degree: occupational, Scientist, product/process development, or vocational program  Occupational History   Occupation: retired    Comment: 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   Tobacco Use   Smoking status: Former    Packs/day: 2.00    Years: 40.00    Additional pack years: 0.00    Total pack years: 80.00    Types: Cigarettes    Quit date: 06/01/1997    Years  since quitting: 25.3   Smokeless tobacco: Never  Vaping Use   Vaping Use: Never used  Substance and Sexual Activity   Alcohol use: No   Drug use: No   Sexual activity: Not Currently  Other Topics Concern   Not on file  Social History Narrative   One level home with wife    50/7780 - 28 year old MIL lives with them   Social Determinants of Health   Financial Resource Strain: Low Risk  (08/14/2022)   Overall Financial Resource Strain (CARDIA)    Difficulty of Paying Living Expenses: Not hard at all  Food Insecurity: No Food Insecurity (08/14/2022)   Hunger Vital Sign    Worried About Running Out of Food in the Last Year: Never true    Ran Out of Food in the Last Year: Never true  Transportation Needs: No Transportation Needs (08/14/2022)   PRAPARE - Administrator, Civil Service (Medical): No    Lack of Transportation (Non-Medical): No  Physical Activity: Inactive (08/14/2022)   Exercise Vital Sign    Days of Exercise per Week: 0 days    Minutes of Exercise per Session: 0 min  Stress: No Stress Concern Present (08/14/2022)   [employer]  of Occupational Health - Occupational Stress Questionnaire    Feeling of Stress : Not at all  Social Connections: Moderately Integrated (08/14/2022)   Social Connection and Isolation Panel [NHANES]    Frequency of Communication with Friends and Family: More than three times a week    Frequency of Social Gatherings with Friends and Family: More than three times a week    Attends Religious Services: More than 4 times per year    Active Member of [employer] or Organizations: No    Attends Engineer, structural: Never    Marital Status: Married     Family History: The patient's family history includes Alzheimer's disease in his mother; Congestive Heart Failure in his brother; Diabetes in his daughter; Heart attack in his father; Heart disease in his father; Irregular heart beat in his brother.  ROS:   Please see the history of present illness.     All other systems reviewed and are negative.  EKGs/Labs/Other Studies Reviewed:    The following studies were reviewed today:   EKG:   10/06/22: Sinus rhythm with first-degree AV block, rate 88, nonspecific intraventricular conduction delay, right axis deviation  Recent Labs: 09/16/2022: TSH 2.090 09/27/2022: B Natriuretic Peptide 245.0; Magnesium 2.0 10/06/2022: ALT 17; BUN 31; Creatinine, Ser 1.65; Hemoglobin 12.8; Platelets 109; Potassium 3.9; Sodium 127  Recent Lipid Panel    Component Value Date/Time   CHOL 152 07/07/2022 0916   TRIG 171 (H) 07/07/2022 0916   HDL 28 (L) 07/07/2022 0916   CHOLHDL 5.4 (H) 07/07/2022 0916   LDLCALC 94 07/07/2022 0916   LDLDIRECT 96 02/25/2016 1352    Physical Exam:    VS:  BP 96/62   Pulse 88   Ht 5\' 6"  (1.676 m)   Wt 215 lb 12.8 oz (97.9 kg)   SpO2 96%   BMI 34.83 kg/m     Wt Readings from Last 3 Encounters:  10/06/22 215 lb 12.8 oz (97.9 kg)  10/06/22 215 lb 12.8 oz (97.9 kg)  09/27/22 217 lb (98.4 kg)     GEN:  Well nourished, well developed in no acute distress HEENT: Normal NECK:  No JVD; No carotid bruits LYMPHATICS: No lymphadenopathy CARDIAC: RRR, no murmurs, rubs, gallops RESPIRATORY:  Clear to  auscultation without rales, wheezing or rhonchi  ABDOMEN: Soft, non-tender, non-distended MUSCULOSKELETAL:  No edema; No deformity  SKIN: Warm and dry NEUROLOGIC:  Alert and oriented x 3 PSYCHIATRIC:  Normal affect   ASSESSMENT:    1. Fever, unspecified fever cause   2. Shortness of breath   3. Aneurysm of ascending aorta without rupture (HCC)    PLAN:    Fever/hypotension: Reports fever to 101.8 F last night and soft BP in clinic today (90s over 60s).  Reports feeling lightheaded.  Has history of recurrent UTIs.  Concern for infection, recommend evaluation in the ED  Suspected heart failure:  He was seen in the ED on 09/27/2022 for shortness of breath.  Chest x-ray shows interstitial edema and mildly elevated BNP.  CTPA showed no PE but findings suggestive of pulmonary edema, as well as 4.5 cm ascending aortic aneurysm.  He was given Lasix with improvement and discharged on p.o. Lasix 20 mg daily with referral to cardiology. -He was discharged on Lasix 20 mg daily.  Currently appears euvolemic.  Would hold Lasix for now given hypotension and suspected infection as above. -Echocardiogram  Aortic aneurysm: Ascending aortic aneurysm measuring 45 mm on CTPA 08/2022.  Will follow with CTA chest in 1 year  Hypertension: On lisinopril 5 mg daily, would hold given low blood pressure as above  RTC in 2 weeks  Medication Adjustments/Labs and Tests Ordered: Current medicines are reviewed at length with the patient today.  Concerns regarding medicines are outlined above.  Orders Placed This Encounter  Procedures   EKG 12-Lead   ECHOCARDIOGRAM COMPLETE   No orders of the defined types were placed in this encounter.   Patient Instructions  Recommend proceeding to the emergency room for evaluation  Medication Instructions: Your physician recommends that you continue on  your current medications as directed. Please refer to the Current Medication list given to you today.  *If you need a refill on your cardiac medications before your next appointment, please call your pharmacy*  Testing/Procedures: Your physician has requested that you have an echocardiogram. Echocardiography is a painless test that uses sound waves to create images of your heart. It provides your doctor with information about the size and shape of your heart and how well your heart's chambers and valves are working. This procedure takes approximately one hour. There are no restrictions for this procedure. Please do NOT wear cologne, perfume, aftershave, or lotions (deodorant is allowed). Please arrive 15 minutes prior to your appointment time.  Follow-Up: At [facility], you and your health needs are our priority.  As part of our continuing mission to provide you with exceptional heart care, we have created designated Provider Care Teams.  These Care Teams include your primary Cardiologist (physician) and Advanced Practice Providers (APPs -  Physician Assistants and Nurse Practitioners) who all work together to provide you with the care you need, when you need it.  We recommend signing up for the patient portal called "MyChart".  Sign up information is provided on this After Visit Summary.  MyChart is used to connect with patients for Virtual Visits (Telemedicine).  Patients are able to view lab/test results, encounter notes, upcoming appointments, etc.  Non-urgent messages can be sent to your provider as well.   To learn more about what you can do with MyChart, go to [email or web address].    Your next appointment:   May 24th at 10 AM with Dr. Bjorn Pippin    Signed, Little Ishikawa, MD  10/06/2022 8:42  PM    [facility]

## 2022-10-06 ENCOUNTER — Telehealth: Payer: Self-pay

## 2022-10-06 ENCOUNTER — Other Ambulatory Visit: Payer: Self-pay

## 2022-10-06 ENCOUNTER — Emergency Department (HOSPITAL_COMMUNITY): Payer: Medicare HMO

## 2022-10-06 ENCOUNTER — Encounter (HOSPITAL_COMMUNITY): Payer: Self-pay | Admitting: Emergency Medicine

## 2022-10-06 ENCOUNTER — Inpatient Hospital Stay (HOSPITAL_COMMUNITY)
Admission: EM | Admit: 2022-10-06 | Discharge: 2022-10-13 | DRG: 286 | Disposition: A | Discharge status: Home / Self Care | Payer: Medicare HMO | Attending: Internal Medicine | Admitting: Internal Medicine

## 2022-10-06 ENCOUNTER — Encounter: Payer: Self-pay | Admitting: Cardiology

## 2022-10-06 ENCOUNTER — Ambulatory Visit: Payer: Medicare HMO | Attending: Cardiology | Admitting: Cardiology

## 2022-10-06 VITALS — BP 96/62 | HR 88 | Ht 66.0 in | Wt 215.8 lb

## 2022-10-06 DIAGNOSIS — I5020 Unspecified systolic (congestive) heart failure: Secondary | ICD-10-CM

## 2022-10-06 DIAGNOSIS — I5023 Acute on chronic systolic (congestive) heart failure: Secondary | ICD-10-CM | POA: Diagnosis present

## 2022-10-06 DIAGNOSIS — I272 Pulmonary hypertension, unspecified: Secondary | ICD-10-CM | POA: Diagnosis present

## 2022-10-06 DIAGNOSIS — Z8551 Personal history of malignant neoplasm of bladder: Secondary | ICD-10-CM

## 2022-10-06 DIAGNOSIS — G4733 Obstructive sleep apnea (adult) (pediatric): Secondary | ICD-10-CM | POA: Diagnosis present

## 2022-10-06 DIAGNOSIS — Z8249 Family history of ischemic heart disease and other diseases of the circulatory system: Secondary | ICD-10-CM

## 2022-10-06 DIAGNOSIS — N1832 Chronic kidney disease, stage 3b: Secondary | ICD-10-CM | POA: Diagnosis present

## 2022-10-06 DIAGNOSIS — R161 Splenomegaly, not elsewhere classified: Secondary | ICD-10-CM | POA: Diagnosis present

## 2022-10-06 DIAGNOSIS — I7121 Aneurysm of the ascending aorta, without rupture: Secondary | ICD-10-CM | POA: Diagnosis present

## 2022-10-06 DIAGNOSIS — R0602 Shortness of breath: Secondary | ICD-10-CM

## 2022-10-06 DIAGNOSIS — Z96612 Presence of left artificial shoulder joint: Secondary | ICD-10-CM | POA: Diagnosis present

## 2022-10-06 DIAGNOSIS — M19011 Primary osteoarthritis, right shoulder: Secondary | ICD-10-CM | POA: Diagnosis present

## 2022-10-06 DIAGNOSIS — Z1619 Resistance to other specified beta lactam antibiotics: Secondary | ICD-10-CM | POA: Diagnosis present

## 2022-10-06 DIAGNOSIS — Z79899 Other long term (current) drug therapy: Secondary | ICD-10-CM | POA: Diagnosis not present

## 2022-10-06 DIAGNOSIS — Z9981 Dependence on supplemental oxygen: Secondary | ICD-10-CM

## 2022-10-06 DIAGNOSIS — L409 Psoriasis, unspecified: Secondary | ICD-10-CM | POA: Diagnosis present

## 2022-10-06 DIAGNOSIS — K4020 Bilateral inguinal hernia, without obstruction or gangrene, not specified as recurrent: Secondary | ICD-10-CM | POA: Diagnosis present

## 2022-10-06 DIAGNOSIS — I10 Essential (primary) hypertension: Secondary | ICD-10-CM | POA: Diagnosis present

## 2022-10-06 DIAGNOSIS — Z9049 Acquired absence of other specified parts of digestive tract: Secondary | ICD-10-CM

## 2022-10-06 DIAGNOSIS — Z936 Other artificial openings of urinary tract status: Secondary | ICD-10-CM | POA: Diagnosis not present

## 2022-10-06 DIAGNOSIS — Z906 Acquired absence of other parts of urinary tract: Secondary | ICD-10-CM

## 2022-10-06 DIAGNOSIS — K219 Gastro-esophageal reflux disease without esophagitis: Secondary | ICD-10-CM | POA: Diagnosis present

## 2022-10-06 DIAGNOSIS — K3189 Other diseases of stomach and duodenum: Secondary | ICD-10-CM | POA: Diagnosis not present

## 2022-10-06 DIAGNOSIS — N3000 Acute cystitis without hematuria: Secondary | ICD-10-CM | POA: Diagnosis not present

## 2022-10-06 DIAGNOSIS — Z1152 Encounter for screening for COVID-19: Secondary | ICD-10-CM | POA: Diagnosis not present

## 2022-10-06 DIAGNOSIS — I130 Hypertensive heart and chronic kidney disease with heart failure and stage 1 through stage 4 chronic kidney disease, or unspecified chronic kidney disease: Principal | ICD-10-CM | POA: Diagnosis present

## 2022-10-06 DIAGNOSIS — E871 Hypo-osmolality and hyponatremia: Secondary | ICD-10-CM | POA: Insufficient documentation

## 2022-10-06 DIAGNOSIS — Z96611 Presence of right artificial shoulder joint: Secondary | ICD-10-CM | POA: Diagnosis present

## 2022-10-06 DIAGNOSIS — E861 Hypovolemia: Secondary | ICD-10-CM | POA: Diagnosis not present

## 2022-10-06 DIAGNOSIS — E039 Hypothyroidism, unspecified: Secondary | ICD-10-CM | POA: Insufficient documentation

## 2022-10-06 DIAGNOSIS — K5730 Diverticulosis of large intestine without perforation or abscess without bleeding: Secondary | ICD-10-CM | POA: Diagnosis not present

## 2022-10-06 DIAGNOSIS — H9311 Tinnitus, right ear: Secondary | ICD-10-CM | POA: Diagnosis present

## 2022-10-06 DIAGNOSIS — E669 Obesity, unspecified: Secondary | ICD-10-CM | POA: Diagnosis present

## 2022-10-06 DIAGNOSIS — I5043 Acute on chronic combined systolic (congestive) and diastolic (congestive) heart failure: Secondary | ICD-10-CM | POA: Diagnosis not present

## 2022-10-06 DIAGNOSIS — R509 Fever, unspecified: Secondary | ICD-10-CM | POA: Diagnosis not present

## 2022-10-06 DIAGNOSIS — Z96653 Presence of artificial knee joint, bilateral: Secondary | ICD-10-CM | POA: Diagnosis present

## 2022-10-06 DIAGNOSIS — Z8619 Personal history of other infectious and parasitic diseases: Secondary | ICD-10-CM

## 2022-10-06 DIAGNOSIS — N179 Acute kidney failure, unspecified: Secondary | ICD-10-CM | POA: Diagnosis present

## 2022-10-06 DIAGNOSIS — N390 Urinary tract infection, site not specified: Secondary | ICD-10-CM | POA: Diagnosis present

## 2022-10-06 DIAGNOSIS — M19022 Primary osteoarthritis, left elbow: Secondary | ICD-10-CM | POA: Diagnosis present

## 2022-10-06 DIAGNOSIS — Z6834 Body mass index (BMI) 34.0-34.9, adult: Secondary | ICD-10-CM

## 2022-10-06 DIAGNOSIS — Z8711 Personal history of peptic ulcer disease: Secondary | ICD-10-CM

## 2022-10-06 DIAGNOSIS — Z87440 Personal history of urinary (tract) infections: Secondary | ICD-10-CM

## 2022-10-06 DIAGNOSIS — I5041 Acute combined systolic (congestive) and diastolic (congestive) heart failure: Secondary | ICD-10-CM | POA: Insufficient documentation

## 2022-10-06 DIAGNOSIS — D696 Thrombocytopenia, unspecified: Secondary | ICD-10-CM | POA: Insufficient documentation

## 2022-10-06 DIAGNOSIS — I2510 Atherosclerotic heart disease of native coronary artery without angina pectoris: Secondary | ICD-10-CM | POA: Diagnosis present

## 2022-10-06 DIAGNOSIS — M19021 Primary osteoarthritis, right elbow: Secondary | ICD-10-CM | POA: Diagnosis present

## 2022-10-06 DIAGNOSIS — D631 Anemia in chronic kidney disease: Secondary | ICD-10-CM | POA: Diagnosis present

## 2022-10-06 DIAGNOSIS — I959 Hypotension, unspecified: Secondary | ICD-10-CM | POA: Diagnosis present

## 2022-10-06 DIAGNOSIS — I447 Left bundle-branch block, unspecified: Secondary | ICD-10-CM | POA: Diagnosis present

## 2022-10-06 DIAGNOSIS — I5021 Acute systolic (congestive) heart failure: Secondary | ICD-10-CM

## 2022-10-06 DIAGNOSIS — I513 Intracardiac thrombosis, not elsewhere classified: Secondary | ICD-10-CM | POA: Diagnosis present

## 2022-10-06 DIAGNOSIS — B961 Klebsiella pneumoniae [K. pneumoniae] as the cause of diseases classified elsewhere: Secondary | ICD-10-CM | POA: Diagnosis present

## 2022-10-06 DIAGNOSIS — Z87891 Personal history of nicotine dependence: Secondary | ICD-10-CM

## 2022-10-06 DIAGNOSIS — N1831 Chronic kidney disease, stage 3a: Secondary | ICD-10-CM | POA: Diagnosis not present

## 2022-10-06 DIAGNOSIS — R7989 Other specified abnormal findings of blood chemistry: Secondary | ICD-10-CM | POA: Insufficient documentation

## 2022-10-06 DIAGNOSIS — Z79890 Hormone replacement therapy (postmenopausal): Secondary | ICD-10-CM

## 2022-10-06 DIAGNOSIS — M797 Fibromyalgia: Secondary | ICD-10-CM | POA: Diagnosis present

## 2022-10-06 DIAGNOSIS — Z91199 Patient's noncompliance with other medical treatment and regimen due to unspecified reason: Secondary | ICD-10-CM

## 2022-10-06 DIAGNOSIS — E049 Nontoxic goiter, unspecified: Secondary | ICD-10-CM | POA: Diagnosis present

## 2022-10-06 DIAGNOSIS — K8020 Calculus of gallbladder without cholecystitis without obstruction: Secondary | ICD-10-CM | POA: Diagnosis present

## 2022-10-06 DIAGNOSIS — I429 Cardiomyopathy, unspecified: Secondary | ICD-10-CM | POA: Diagnosis present

## 2022-10-06 DIAGNOSIS — Z905 Acquired absence of kidney: Secondary | ICD-10-CM

## 2022-10-06 DIAGNOSIS — M170 Bilateral primary osteoarthritis of knee: Secondary | ICD-10-CM | POA: Diagnosis present

## 2022-10-06 DIAGNOSIS — M19012 Primary osteoarthritis, left shoulder: Secondary | ICD-10-CM | POA: Diagnosis present

## 2022-10-06 LAB — CBC WITH DIFFERENTIAL/PLATELET
Abs Immature Granulocytes: 0.04 10*3/uL (ref 0.00–0.07)
Basophils Absolute: 0 10*3/uL (ref 0.0–0.1)
Basophils Relative: 0 %
Eosinophils Absolute: 0.1 10*3/uL (ref 0.0–0.5)
Eosinophils Relative: 1 %
HCT: 37.5 % — ABNORMAL LOW (ref 39.0–52.0)
Hemoglobin: 12.8 g/dL — ABNORMAL LOW (ref 13.0–17.0)
Immature Granulocytes: 0 %
Lymphocytes Relative: 8 %
Lymphs Abs: 0.8 10*3/uL (ref 0.7–4.0)
MCH: 30.9 pg (ref 26.0–34.0)
MCHC: 34.1 g/dL (ref 30.0–36.0)
MCV: 90.6 fL (ref 80.0–100.0)
Monocytes Absolute: 1 10*3/uL (ref 0.1–1.0)
Monocytes Relative: 9 %
Neutro Abs: 8.5 10*3/uL — ABNORMAL HIGH (ref 1.7–7.7)
Neutrophils Relative %: 82 %
Platelets: 109 10*3/uL — ABNORMAL LOW (ref 150–400)
RBC: 4.14 MIL/uL — ABNORMAL LOW (ref 4.22–5.81)
RDW: 14.5 % (ref 11.5–15.5)
WBC: 10.4 10*3/uL (ref 4.0–10.5)
nRBC: 0 % (ref 0.0–0.2)

## 2022-10-06 LAB — TROPONIN I (HIGH SENSITIVITY)
Troponin I (High Sensitivity): 31 ng/L — ABNORMAL HIGH (ref <18)
Troponin I (High Sensitivity): 28 ng/L — ABNORMAL HIGH (ref <18)

## 2022-10-06 LAB — URINALYSIS, W/ REFLEX TO CULTURE (INFECTION SUSPECTED)
Bilirubin Urine: NEGATIVE
Glucose, UA: NEGATIVE mg/dL
Hgb urine dipstick: NEGATIVE
Ketones, ur: NEGATIVE mg/dL
Nitrite: POSITIVE — AB
Protein, ur: NEGATIVE mg/dL
Specific Gravity, Urine: 1.009 (ref 1.005–1.030)
pH: 6 (ref 5.0–8.0)

## 2022-10-06 LAB — COMPREHENSIVE METABOLIC PANEL
ALT: 17 U/L (ref 0–44)
AST: 28 U/L (ref 15–41)
Albumin: 3.9 g/dL (ref 3.5–5.0)
Alkaline Phosphatase: 43 U/L (ref 38–126)
Anion gap: 9 (ref 5–15)
BUN: 31 mg/dL — ABNORMAL HIGH (ref 8–23)
CO2: 24 mmol/L (ref 22–32)
Calcium: 9.1 mg/dL (ref 8.9–10.3)
Chloride: 94 mmol/L — ABNORMAL LOW (ref 98–111)
Creatinine, Ser: 1.65 mg/dL — ABNORMAL HIGH (ref 0.61–1.24)
GFR, Estimated: 43 mL/min — ABNORMAL LOW (ref >60)
Glucose, Bld: 139 mg/dL — ABNORMAL HIGH (ref 70–99)
Potassium: 3.9 mmol/L (ref 3.5–5.1)
Sodium: 127 mmol/L — ABNORMAL LOW (ref 135–145)
Total Bilirubin: 1.5 mg/dL — ABNORMAL HIGH (ref 0.3–1.2)
Total Protein: 7.4 g/dL (ref 6.5–8.1)

## 2022-10-06 LAB — D-DIMER, QUANTITATIVE: D-Dimer, Quant: 0.47 ug/mL-FEU (ref 0.00–0.50)

## 2022-10-06 LAB — LACTIC ACID, PLASMA: Lactic Acid, Venous: 1.2 mmol/L (ref 0.5–1.9)

## 2022-10-06 LAB — SARS CORONAVIRUS 2 BY RT PCR: SARS Coronavirus 2 by RT PCR: NEGATIVE

## 2022-10-06 MED ORDER — SODIUM CHLORIDE 0.9 % IV BOLUS
500.0000 mL | Freq: Once | INTRAVENOUS | Status: AC
  Administered 2022-10-06: 500 mL via INTRAVENOUS

## 2022-10-06 MED ORDER — ONDANSETRON HCL 4 MG/2ML IJ SOLN: 4.0000 mg | Freq: Four times a day (QID) | INTRAMUSCULAR | Status: DC | PRN

## 2022-10-06 MED ORDER — ACETAMINOPHEN 325 MG PO TABS
650.0000 mg | ORAL_TABLET | Freq: Four times a day (QID) | ORAL | Status: DC | PRN
  Administered 2022-10-08 – 2022-10-12 (×3): 650 mg via ORAL
  Filled 2022-10-06 (×2): qty 2

## 2022-10-06 MED ORDER — ENOXAPARIN SODIUM 40 MG/0.4ML IJ SOSY
40.0000 mg | PREFILLED_SYRINGE | INTRAMUSCULAR | Status: DC
  Administered 2022-10-07 – 2022-10-13 (×6): 40 mg via SUBCUTANEOUS
  Filled 2022-10-06 (×6): qty 0.4

## 2022-10-06 MED ORDER — SODIUM CHLORIDE 0.9 % IV SOLN: Freq: Once | INTRAVENOUS | Status: AC

## 2022-10-06 MED ORDER — SODIUM CHLORIDE 0.9 % IV SOLN
1.0000 g | Freq: Once | INTRAVENOUS | Status: AC
  Administered 2022-10-06: 1 g via INTRAVENOUS
  Filled 2022-10-06: qty 10

## 2022-10-06 MED ORDER — ACETAMINOPHEN 650 MG RE SUPP: 650.0000 mg | Freq: Four times a day (QID) | RECTAL | Status: DC | PRN

## 2022-10-06 MED ORDER — ALBUTEROL SULFATE HFA 108 (90 BASE) MCG/ACT IN AERS: 2.0000 | INHALATION_SPRAY | RESPIRATORY_TRACT | Status: DC | PRN

## 2022-10-06 MED ORDER — ONDANSETRON HCL 4 MG PO TABS: 4.0000 mg | ORAL_TABLET | Freq: Four times a day (QID) | ORAL | Status: DC | PRN

## 2022-10-06 NOTE — H&P (Signed)
History and Physical    Patient: Billy Rasmussen ZOX:[id number] DOB: 1945-11-22 DOA: 10/06/2022 DOS: the patient was seen and examined on 10/07/2022 PCP: Raliegh Ip, DO  Patient coming from: Home  Chief Complaint:  Chief Complaint  Patient presents with   Shortness of Breath   HPI: Billy Rasmussen is a 77 y.o. male with medical history significant of hypertension, hypothyroidism, history of bladder cancer status post left nephrectomy, cystoprostatectomy, ascending aortic aneurysm who presents to the emergency department due to 3-week onset of shortness of breath and generalized weakness.  Patient presents to the emergency department on 09/27/2022 where he was suspected to have congestive heart failure.  He followed up with cardiologist today and was noted to be hypotensive.  He complained of fever of 101.42F with chills and diaphoresis at home and also complained of poor appetite since onset of symptoms.   ED Course:  In the Emergency Department, BP was soft at 96/62, pulse 88 bpm, temperature 99 F 92/min, O2 sat 96% on room air.  Workup in the ED showed normocytic anemia and thrombocytopenia.  BMP showed sodium of 127, potassium 3.9, chloride 94, bicarb 24, blood glucose 139, BUNs/creatinine 31/1.65 (baseline creatinine at 1.3-1.5), EGFR 43.  Troponin- 31 > 28,  D-dimer 0.47.  Lactic acid 1.2.  SARS coronavirus 2 was negative. Chest x-ray showed no active disease Patient was treated with IV ceftriaxone, IV hydration was provided. Hospitalist was asked to admit patient for further evaluation and management.   Review of Systems: Review of systems as noted in the HPI. All other systems reviewed and are negative.   Past Medical History:  Diagnosis Date   Arthritis    knees, shoulders, elbows   Bladder cancer [facility])    urologist-  dr eskridge   Diverticulosis of colon    Fibromyalgia    GERD (gastroesophageal reflux disease)    History of diverticulitis of colon    History of  gastric ulcer    due to aleve   Hypertension    Lower urinary tract symptoms (LUTS)    OSA (obstructive sleep apnea)    NON-COMPLIANT  CPAP  --- BUT PT USES OXYGEN AT NIGHT 2.5L VIA [place] (PT'S DECISION)   PONV (postoperative nausea and vomiting)    Psoriasis    Sepsis (HCC)    january 2021   Tinnitus    right ear more, has tranmitter in right ear removable at hs   Wears glasses    Wears partial dentures    Past Surgical History:  Procedure Laterality Date   APPENDECTOMY     COLECTOMY W/ COLOSTOMY  1996   W/   APPENDECTOMY   COLONOSCOPY N/A 05/11/2018   Procedure: COLONOSCOPY;  Surgeon: Corbin Ade, MD;  [facility];  Service: Endoscopy;  Laterality: N/A;  9:30   COLOSTOMY TAKEDOWN  1996   CYSTOSCOPY WITH BIOPSY N/A 12/05/2013   Procedure: CYSTO BLADDER BIOPSY AND FULGERATION;  Surgeon: Jerilee Field, MD;  Location: [facility];  Service: Urology;  Laterality: N/A;   CYSTOSCOPY WITH BIOPSY Bilateral 11/13/2014   Procedure: CYSTOSCOPY WITH  BLADDER BIOPSY FULGERATION AND BILATERAL RETROGRADE PYELOGRAMS;  Surgeon: Jerilee Field, MD;  Location: [facility];  Service: Urology;  Laterality: Bilateral;   CYSTOSCOPY WITH FULGERATION N/A 01/18/2018   Procedure: CYSTOSCOPY WITH FULGERATION/ BLADDER BIOPSY;  Surgeon: Jerilee Field, MD;  Location: [facility];  Service: Urology;  Laterality: N/A;   CYSTOSCOPY WITH INJECTION N/A 11/09/2018   Procedure:  CYSTOSCOPY WITH INJECTION OF INDOCYANINE GREEN DYE;  Surgeon: Sebastian Ache, MD;  Location: [facility];  Service: Urology;  Laterality: N/A;   CYSTOSCOPY WITH INSERTION OF UROLIFT N/A 01/18/2018   Procedure: CYSTOSCOPY WITH INSERTION OF UROLIFT;  Surgeon: Jerilee Field, MD;  Location: [facility];  Service: Urology;  Laterality: N/A;   CYSTOSCOPY/URETEROSCOPY/HOLMIUM LASER Left 02/17/2019   Procedure: URETEROSCOPY WITH BALLOON DILATION  AND NEPHROSTOGRAM;  Surgeon:  Sebastian Ache, MD;  Location[facility];  Service: Urology;  Laterality: Left;  1 HR   EXCISION RIGHT UPPER ARM LIPOMA  2005   HEMORROIDECTOMY     INGUINAL HERNIA REPAIR Left 1984   IR NEPHROSTOMY PLACEMENT LEFT  01/05/2019   KNEE ARTHROSCOPY Left X3  LAST ONE  2002   ORIF LEFT HUMEROUS FX  1976   POLYPECTOMY  05/11/2018   Procedure: POLYPECTOMY;  Surgeon: Corbin Ade, MD;  [facility];  Service: Endoscopy;;   PROSTATE SURGERY     ROBOT ASSISTED LAPAROSCOPIC NEPHRECTOMY Left 07/14/2019   Procedure: XI ROBOTIC ASSISTED LAPAROSCOPIC RETROPERITONEAL NEPHRECTOMY;  Surgeon: Sebastian Ache, MD;  Location: [facility];  Service: Urology;  Laterality: Left;  3 HRS   TONSILLECTOMY  AS CHILD   TOTAL KNEE ARTHROPLASTY Left 2006   REVISION 2007  (AFTER I & D WITH ANTIBIOTIC SPACER PROCEDURE FOR STEPH INFECTION)   TOTAL KNEE ARTHROPLASTY Right 03/30/2016   Procedure: RIGHT TOTAL KNEE ARTHROPLASTY;  Surgeon: Durene Romans, MD;  Location: [facility];  Service: Orthopedics;  Laterality: Right;   TOTAL SHOULDER ARTHROPLASTY Right 04/06/2013   Procedure: RIGHT TOTAL SHOULDER ARTHROPLASTY;  Surgeon: Senaida Lange, MD;  Location: [facility];  Service: Orthopedics;  Laterality: Right;   TOTAL SHOULDER ARTHROPLASTY Left 07/20/2013   Procedure: LEFT TOTAL SHOULDER ARTHROPLASTY;  Surgeon: Senaida Lange, MD;  Location: [facility];  Service: Orthopedics;  Laterality: Left;   TRANSURETHRAL RESECTION OF BLADDER TUMOR N/A 11/12/2015   Procedure: TRANSURETHRAL RESECTION OF BLADDER TUMOR (TURBT);  Surgeon: Jerilee Field, MD;  Location: [facility];  Service: Urology;  Laterality: N/A;   TRANSURETHRAL RESECTION OF BLADDER TUMOR WITH GYRUS (TURBT-GYRUS) N/A 12/05/2013   Procedure: TRANSURETHRAL RESECTION OF BLADDER TUMOR WITH GYRUS (TURBT-GYRUS);  Surgeon: Jerilee Field, MD;  Location: [facility];  Service: Urology;  Laterality: N/A;    Social History:  reports that he quit  smoking about 25 years ago. His smoking use included cigarettes. He has a 80.00 pack-year smoking history. He has never used smokeless tobacco. He reports that he does not drink alcohol and does not use drugs.   No Known Allergies  Family History  Problem Relation Age of Onset   Alzheimer's disease Mother    Heart attack Father    Heart disease Father    Irregular heart beat Brother        DEFIB.   Congestive Heart Failure Brother    Diabetes Daughter      Prior to Admission medications   Medication Sig Start Date End Date Taking? Authorizing Provider  furosemide (LASIX) 20 MG tablet Take 1 tablet (20 mg total) by mouth daily. 09/27/22   Dione Booze, MD  levothyroxine (SYNTHROID) 25 MCG tablet Take 1 tablet (25 mcg total) by mouth daily before breakfast. Take on an empty stomach with water only.  Wait to consume other foods/ drinks/ meds for 30 minutes. 08/24/22   Raliegh Ip, DO  lisinopril (ZESTRIL) 5 MG tablet Take 1 tablet (5 mg total) by mouth  daily. 07/31/22   Raliegh Ip, DO  meclizine (ANTIVERT) 25 MG tablet     [provider]  polyvinyl alcohol (LIQUIFILM TEARS) 1.4 % ophthalmic solution Place 1 drop into both eyes as needed for dry eyes. 05/16/19   Saundra Shelling, MD    Physical Exam: BP 115/77   Pulse (!) 103   Temp 98.5 F (36.9 C) (Oral)   Resp 18   Ht 5\' 6"  (1.676 m)   Wt 97.3 kg   SpO2 100%   BMI 34.64 kg/m   General: 77 y.o. year-old male well developed well nourished in no acute distress.  Alert and oriented x3. HEENT: NCAT, EOMI Neck: Supple, trachea medial Cardiovascular: Regular rate and rhythm with no rubs or gallops.  No thyromegaly or JVD noted.  No lower extremity edema. 2/4 pulses in all 4 extremities. Respiratory: Clear to auscultation with no wheezes or rales. Good inspiratory effort. Abdomen: Soft, nontender nondistended with normal bowel sounds x4 quadrants.  Nephrostomy bag noted Muskuloskeletal: No cyanosis, clubbing or  edema noted bilaterally Neuro: CN II-XII intact, strength 5/5 x 4, sensation, reflexes intact Skin: No ulcerative lesions noted or rashes Psychiatry: Judgement and insight appear normal. Mood is appropriate for condition and setting          Labs on Admission:  Basic Metabolic Panel: Recent Labs  Lab 10/06/22 1915  NA 127*  K 3.9  CL 94*  CO2 24  GLUCOSE 139*  BUN 31*  CREATININE 1.65*  CALCIUM 9.1   Liver Function Tests: Recent Labs  Lab 10/06/22 1915  AST 28  ALT 17  ALKPHOS 43  BILITOT 1.5*  PROT 7.4  ALBUMIN 3.9   No results for input(s): "LIPASE", "AMYLASE" in the last 168 hours. No results for input(s): "AMMONIA" in the last 168 hours. CBC: Recent Labs  Lab 10/06/22 1915  WBC 10.4  NEUTROABS 8.5*  HGB 12.8*  HCT 37.5*  MCV 90.6  PLT 109*   Cardiac Enzymes: No results for input(s): "CKTOTAL", "CKMB", "CKMBINDEX", "TROPONINI" in the last 168 hours.  BNP (last 3 results) Recent Labs    09/27/22 0302  BNP 245.0*    ProBNP (last 3 results) No results for input(s): "PROBNP" in the last 8760 hours.  CBG: No results for input(s): "GLUCAP" in the last 168 hours.  Radiological Exams on Admission: DG Chest Port 1 View  Result Date: 10/06/2022 CLINICAL DATA:  Shortness of breath. EXAM: PORTABLE CHEST 1 VIEW COMPARISON:  Chest radiograph dated 09/27/2022 and CT dated 09/27/2022. FINDINGS: No focal consolidation, pleural effusion, or pneumothorax. Stable mild cardiomegaly. No acute osseous pathology. Bilateral shoulder hemi arthroplasties. IMPRESSION: No active disease. Electronically Signed   By: Elgie Collard M.D.   On: 10/06/2022 19:25    EKG: I independently viewed the EKG done and my findings are as followed: Normal sinus rhythm at a rate of 95 bpm with first-degree AV block.  QTc  Assessment/Plan Present on Admission:  Hypotension  UTI (urinary tract infection)  Obesity (BMI 30-39.9)  Essential hypertension  Principal Problem:    Hypotension Active Problems:   Obesity (BMI 30-39.9)   Essential hypertension   UTI (urinary tract infection)   Hyponatremia   Thrombocytopenia (HCC)   Elevated troponin   Acquired hypothyroidism  Hypotension Blood pressure has improved since arrival to the ED, currently at 115/77 with a MAP of 59 Continue to monitor BP  UTI POA Patient was empirically started on ceftriaxone Urine culture done on 12/26/2019 was positive for Klebsiella  pneumonia which was resistant to ceftriaxone but sensitive to ciprofloxacin.  Ciprofloxacin will be started Urine culture pending  Hyponatremia Na 127, this is possibly due to diuretic effect Gentle hydration was provided Continue to monitor sodium with serial BMPs Urine osmolality and urine sodium will be checked  Chronic thrombocytopenia Platelets 109:, Continue to monitor platelet levels with morning labs  Elevated troponin possible secondary to type II demand ischemia Troponin 31 > 28.  Patient denies chest pain.   Obesity (BMI 34.64) Diet and lifestyle modification  Essential hypertension BP meds will be held at this time due to soft BP  Acquired hypothyroidism Continue Synthroid   DVT prophylaxis: Lovenox   Advance Care Planning: Full code  Consults: None   Family Communication: None at bedside  Severity of Illness: The appropriate patient status for this patient is OBSERVATION. Observation status is judged to be reasonable and necessary in order to provide the required intensity of service to ensure the patient's safety. The patient's presenting symptoms, physical exam findings, and initial radiographic and laboratory data in the context of their medical condition is felt to place them at decreased risk for further clinical deterioration. Furthermore, it is anticipated that the patient will be medically stable for discharge from the hospital within 2 midnights of admission.   Author: Frankey Shown, DO 10/07/2022 2:29  AM  For on call review [email or web address].

## 2022-10-06 NOTE — Telephone Encounter (Signed)
Transition Care Management Follow-up Telephone Call Date of discharge and from where: [facility] How have you been since you were released from the hospital? Wife stated Not good has appointment today with heart Doctor on the way to his appointment now.  Any questions or concerns? Yes  Items Reviewed: Did the pt receive and understand the discharge instructions provided? Yes  Medications obtained and verified?    Other?    Any new allergies since your discharge?    Dietary orders reviewed? No Do you have support at home? Yes     Follow up appointments reviewed:  PCP Hospital f/u appt confirmed?     Scheduled to see  on  @ . Specialist Hospital f/u appt confirmed?     Scheduled to see  on  @ . Are transportation arrangements needed?  If their condition worsens, is the pt aware to call PCP or go to the Emergency Dept.? Yes Was the patient provided with contact information for the PCP's office or ED? Yes Was to pt encouraged to call back with questions or concerns? Yes

## 2022-10-06 NOTE — ED Provider Notes (Signed)
[facility] EMERGENCY DEPARTMENT AT [facility] Provider Note   CSN: [id number] Arrival date & time: 10/06/22  1829     History  Chief Complaint  Patient presents with   Shortness of Breath    Billy Rasmussen is a 77 y.o. male.  Patient with complaint of shortness of breath for 3 weeks. Seen in ED for same on 09/27/22 and diagnosed with heart failure. During visit with cardiologist today, noted to be hypotensive. Poor appetite, feels bloated, decreased sense of taste. Febrile at home at 101.8 with chills and diaphoresis. Denies chest pain.  The history is provided by the patient and medical records.  Shortness of Breath Severity:  Moderate Onset quality:  Gradual Duration:  3 weeks Timing:  Intermittent Progression:  Waxing and waning Chronicity:  New Context: activity   Associated symptoms: fever        Home Medications Prior to Admission medications   Medication Sig Start Date End Date Taking? Authorizing Provider  furosemide (LASIX) 20 MG tablet Take 1 tablet (20 mg total) by mouth daily. 09/27/22   Dione Booze, MD  levothyroxine (SYNTHROID) 25 MCG tablet Take 1 tablet (25 mcg total) by mouth daily before breakfast. Take on an empty stomach with water only.  Wait to consume other foods/ drinks/ meds for 30 minutes. 08/24/22   Raliegh Ip, DO  lisinopril (ZESTRIL) 5 MG tablet Take 1 tablet (5 mg total) by mouth daily. 07/31/22   Raliegh Ip, DO  meclizine (ANTIVERT) 25 MG tablet     [provider]  polyvinyl alcohol (LIQUIFILM TEARS) 1.4 % ophthalmic solution Place 1 drop into both eyes as needed for dry eyes. 05/16/19   Saundra Shelling, MD      Allergies    Patient has no known allergies.    Review of Systems   Review of Systems  Constitutional:  Positive for fever.  Respiratory:  Positive for shortness of breath.     Physical Exam Updated Vital Signs BP (!) 109/48 (BP Location: Right Arm)   Pulse 99   Temp 99 F (37.2 C)  (Oral)   Resp (!) 22   Ht 5\' 6"  (1.676 m)   Wt 97.9 kg   SpO2 98%   BMI 34.83 kg/m  Physical Exam  ED Results / Procedures / Treatments   Labs (all labs ordered are listed, but only abnormal results are displayed) Labs Reviewed  CBC WITH DIFFERENTIAL/PLATELET - Abnormal; Notable for the following components:      Result Value   RBC 4.14 (*)    Hemoglobin 12.8 (*)    HCT 37.5 (*)    Platelets 109 (*)    Neutro Abs 8.5 (*)    All other components within normal limits  COMPREHENSIVE METABOLIC PANEL - Abnormal; Notable for the following components:   Sodium 127 (*)    Chloride 94 (*)    Glucose, Bld 139 (*)    BUN 31 (*)    Creatinine, Ser 1.65 (*)    Total Bilirubin 1.5 (*)    GFR, Estimated 43 (*)    All other components within normal limits  URINALYSIS, W/ REFLEX TO CULTURE (INFECTION SUSPECTED) - Abnormal; Notable for the following components:   Nitrite POSITIVE (*)    Leukocytes,Ua LARGE (*)    Bacteria, UA MANY (*)    All other components within normal limits  TROPONIN I (HIGH SENSITIVITY) - Abnormal; Notable for the following components:   Troponin I (High Sensitivity) 31 (*)  All other components within normal limits  TROPONIN I (HIGH SENSITIVITY) - Abnormal; Notable for the following components:   Troponin I (High Sensitivity) 28 (*)    All other components within normal limits  SARS CORONAVIRUS 2 BY RT PCR  D-DIMER, QUANTITATIVE  LACTIC ACID, PLASMA    EKG EKG Interpretation  Date/Time:  Tuesday Oct 06 2022 18:39:46 EDT Ventricular Rate:  95 PR Interval:  248 QRS Duration: 164 QT Interval:  394 QTC Calculation: 495 R Axis:   139 Text Interpretation: Sinus rhythm with 1st degree A-V block Non-specific intra-ventricular conduction block Confirmed by Gloris Manchester [phone]) on 10/06/2022 7:39:50 PM  Radiology DG Chest Port 1 View  Result Date: 10/06/2022 CLINICAL DATA:  Shortness of breath. EXAM: PORTABLE CHEST 1 VIEW COMPARISON:  Chest radiograph dated  09/27/2022 and CT dated 09/27/2022. FINDINGS: No focal consolidation, pleural effusion, or pneumothorax. Stable mild cardiomegaly. No acute osseous pathology. Bilateral shoulder hemi arthroplasties. IMPRESSION: No active disease. Electronically Signed   By: Elgie Collard M.D.   On: 10/06/2022 19:25    Procedures Procedures    Medications Ordered in ED Medications  albuterol (VENTOLIN HFA) 108 (90 Base) MCG/ACT inhaler 2 puff (has no administration in time range)  cefTRIAXone (ROCEPHIN) 1 g in sodium chloride 0.9 % 100 mL IVPB (has no administration in time range)  sodium chloride 0.9 % bolus 500 mL (0 mLs Intravenous Stopped 10/06/22 2123)    ED Course/ Medical Decision Making/ A&P                             Medical Decision Making Amount and/or Complexity of Data Reviewed Labs: ordered. Radiology: ordered.  Risk Prescription drug management.  History of bladder cancer with urostomy. Patient currently with UTI and episode of hypotension documented at cardiology visit today. Has been febrile at home. Cardiology sent patient to the ED. Blood pressure has remained soft in the ED. Mild hyponatremia and elevated BUN. Patient given fluid bolus and ceftriaxone. Lactate is normal. Mildly anemic with today's visit. Negative d-dimer and covid. Chest xray without acute findings. Will request admission for continuation of fluids and observation..  Discussed patient with hospitalist Kateri Mc) for admission.         Final Clinical Impression(s) / ED Diagnoses Final diagnoses:  Urinary tract infection without hematuria, site unspecified    Rx / DC Orders ED Discharge Orders     None         Felicie Morn, NP 10/06/22 [id number]    Gloris Manchester, MD 10/08/22 [phone]

## 2022-10-06 NOTE — Patient Instructions (Addendum)
Recommend proceeding to the emergency room for evaluation  Medication Instructions: Your physician recommends that you continue on your current medications as directed. Please refer to the Current Medication list given to you today.  *If you need a refill on your cardiac medications before your next appointment, please call your pharmacy*  Testing/Procedures: Your physician has requested that you have an echocardiogram. Echocardiography is a painless test that uses sound waves to create images of your heart. It provides your doctor with information about the size and shape of your heart and how well your heart's chambers and valves are working. This procedure takes approximately one hour. There are no restrictions for this procedure. Please do NOT wear cologne, perfume, aftershave, or lotions (deodorant is allowed). Please arrive 15 minutes prior to your appointment time.  Follow-Up: At [facility], you and your health needs are our priority.  As part of our continuing mission to provide you with exceptional heart care, we have created designated Provider Care Teams.  These Care Teams include your primary Cardiologist (physician) and Advanced Practice Providers (APPs -  Physician Assistants and Nurse Practitioners) who all work together to provide you with the care you need, when you need it.  We recommend signing up for the patient portal called "MyChart".  Sign up information is provided on this After Visit Summary.  MyChart is used to connect with patients for Virtual Visits (Telemedicine).  Patients are able to view lab/test results, encounter notes, upcoming appointments, etc.  Non-urgent messages can be sent to your provider as well.   To learn more about what you can do with MyChart, go to [email or web address].    Your next appointment:   May 24th at 10 AM with Dr. Bjorn Pippin

## 2022-10-06 NOTE — H&P (Incomplete)
History and Physical    Patient: Billy Rasmussen:[id number] DOB: [birth year] DOA: 10/06/2022 DOS: the patient was seen and examined on 10/06/2022 PCP: Raliegh Ip, DO  Patient coming from: Home  Chief Complaint:  Chief Complaint  Patient presents with  . Shortness of Breath   HPI: Billy Rasmussen is a 77 y.o. male with medical history significant of hypertension, hypothyroidism, history of bladder cancer status post left nephrectomy, cystoprostatectomy, ascending aortic aneurysm who presents to the emergency department due to 3-week onset of shortness of breath.  Patient presents to the emergency department on 09/27/2022 where he was suspected to have congestive heart failure.  He followed up with cardiologist today and was noted to be hypotensive.  He complained of fever of 101.38F with chills and diaphoresis at home and also complained of poor appetite since onset of symptoms.   ED Course:  In the Emergency Department, BP was soft at 96/62, pulse 88 bpm, temperature 99 F 92/min, O2 sat 96% on room air.  Workup in the ED showed normocytic anemia and thrombocytopenia.  BMP showed sodium of 127, potassium 3.9, chloride 94, bicarb 24, blood glucose 139, BUNs/creatinine 31/1.65 (baseline creatinine at 1.3-1.5), EGFR 43.  Troponin- 31 > 28,  D-dimer 0.47.  Lactic acid 1.2.  SARS coronavirus 2 was negative.  Review of Systems: Review of systems as noted in the HPI. All other systems reviewed and are negative.   Past Medical History:  Diagnosis Date  . Arthritis    knees, shoulders, elbows  . Bladder cancer [facility])    urologist-  dr Mena Goes  . Diverticulosis of colon   . Fibromyalgia   . GERD (gastroesophageal reflux disease)   . History of diverticulitis of colon   . History of gastric ulcer    due to aleve  . Hypertension   . Lower urinary tract symptoms (LUTS)   . OSA (obstructive sleep apnea)    NON-COMPLIANT  CPAP  --- BUT PT USES OXYGEN AT NIGHT 2.5L VIA [place] (PT'S DECISION)   . PONV (postoperative nausea and vomiting)   . Psoriasis   . Sepsis [facility])    january 2021  . Tinnitus    right ear more, has tranmitter in right ear removable at hs  . Wears glasses   . Wears partial dentures    Past Surgical History:  Procedure Laterality Date  . APPENDECTOMY    . COLECTOMY W/ COLOSTOMY  1996   W/   APPENDECTOMY  . COLONOSCOPY N/A 05/11/2018   Procedure: COLONOSCOPY;  Surgeon: Corbin Ade, MD;  [facility];  Service: Endoscopy;  Laterality: N/A;  9:30  . COLOSTOMY TAKEDOWN  1996  . CYSTOSCOPY WITH BIOPSY N/A 12/05/2013   Procedure: CYSTO BLADDER BIOPSY AND FULGERATION;  Surgeon: Jerilee Field, MD;  Location: [facility];  Service: Urology;  Laterality: N/A;  . CYSTOSCOPY WITH BIOPSY Bilateral 11/13/2014   Procedure: CYSTOSCOPY WITH  BLADDER BIOPSY FULGERATION AND BILATERAL RETROGRADE PYELOGRAMS;  Surgeon: Jerilee Field, MD;  Location: [facility];  Service: Urology;  Laterality: Bilateral;  . CYSTOSCOPY WITH FULGERATION N/A 01/18/2018   Procedure: CYSTOSCOPY WITH FULGERATION/ BLADDER BIOPSY;  Surgeon: Jerilee Field, MD;  Location: [facility];  Service: Urology;  Laterality: N/A;  . CYSTOSCOPY WITH INJECTION N/A 11/09/2018   Procedure: CYSTOSCOPY WITH INJECTION OF INDOCYANINE GREEN DYE;  Surgeon: Sebastian Ache, MD;  [facility];  Service: Urology;  Laterality: N/A;  . CYSTOSCOPY WITH INSERTION OF UROLIFT N/A 01/18/2018  Procedure: CYSTOSCOPY WITH INSERTION OF UROLIFT;  Surgeon: Jerilee Field, MD;  Location: [facility];  Service: Urology;  Laterality: N/A;  . CYSTOSCOPY/URETEROSCOPY/HOLMIUM LASER Left 02/17/2019   Procedure: URETEROSCOPY WITH BALLOON DILATION  AND NEPHROSTOGRAM;  Surgeon: Sebastian Ache, MD;  Location: [facility];  Service: Urology;  Laterality: Left;  1 HR  . EXCISION RIGHT UPPER ARM LIPOMA  2005  . HEMORROIDECTOMY    . INGUINAL HERNIA REPAIR  Left 1984  . IR NEPHROSTOMY PLACEMENT LEFT  01/05/2019  . KNEE ARTHROSCOPY Left X3  LAST ONE  2002  . ORIF LEFT HUMEROUS FX  1976  . POLYPECTOMY  05/11/2018   Procedure: POLYPECTOMY;  Surgeon: Corbin Ade, MD;  [facility];  Service: Endoscopy;;  . PROSTATE SURGERY    . ROBOT ASSISTED LAPAROSCOPIC NEPHRECTOMY Left 07/14/2019   Procedure: XI ROBOTIC ASSISTED LAPAROSCOPIC RETROPERITONEAL NEPHRECTOMY;  Surgeon: Sebastian Ache, MD;  [facility];  Service: Urology;  Laterality: Left;  3 HRS  . TONSILLECTOMY  AS CHILD  . TOTAL KNEE ARTHROPLASTY Left 2006   REVISION 2007  (AFTER I & D WITH ANTIBIOTIC SPACER PROCEDURE FOR STEPH INFECTION)  . TOTAL KNEE ARTHROPLASTY Right 03/30/2016   Procedure: RIGHT TOTAL KNEE ARTHROPLASTY;  Surgeon: Durene Romans, MD;  [facility];  Service: Orthopedics;  Laterality: Right;  . TOTAL SHOULDER ARTHROPLASTY Right 04/06/2013   Procedure: RIGHT TOTAL SHOULDER ARTHROPLASTY;  Surgeon: Senaida Lange, MD;  [facility];  Service: Orthopedics;  Laterality: Right;  . TOTAL SHOULDER ARTHROPLASTY Left 07/20/2013   Procedure: LEFT TOTAL SHOULDER ARTHROPLASTY;  Surgeon: Senaida Lange, MD;  [facility];  Service: Orthopedics;  Laterality: Left;  . TRANSURETHRAL RESECTION OF BLADDER TUMOR N/A 11/12/2015   Procedure: TRANSURETHRAL RESECTION OF BLADDER TUMOR (TURBT);  Surgeon: Jerilee Field, MD;  Location: [facility];  Service: Urology;  Laterality: N/A;  . TRANSURETHRAL RESECTION OF BLADDER TUMOR WITH GYRUS (TURBT-GYRUS) N/A 12/05/2013   Procedure: TRANSURETHRAL RESECTION OF BLADDER TUMOR WITH GYRUS (TURBT-GYRUS);  Surgeon: Jerilee Field, MD;  Location: [facility];  Service: Urology;  Laterality: N/A;    Social History:  reports that he quit smoking about 25 years ago. His smoking use included cigarettes. He has a 80.00 pack-year smoking history. He has never used smokeless tobacco. He reports that he does not drink  alcohol and does not use drugs.   No Known Allergies  Family History  Problem Relation Age of Onset  . Alzheimer's disease Mother   . Heart attack Father   . Heart disease Father   . Irregular heart beat Brother        DEFIB.  . Congestive Heart Failure Brother   . Diabetes Daughter     ***  Prior to Admission medications   Medication Sig Start Date End Date Taking? Authorizing Provider  furosemide (LASIX) 20 MG tablet Take 1 tablet (20 mg total) by mouth daily. 09/27/22   Dione Booze, MD  levothyroxine (SYNTHROID) 25 MCG tablet Take 1 tablet (25 mcg total) by mouth daily before breakfast. Take on an empty stomach with water only.  Wait to consume other foods/ drinks/ meds for 30 minutes. 08/24/22   Raliegh Ip, DO  lisinopril (ZESTRIL) 5 MG tablet Take 1 tablet (5 mg total) by mouth daily. 07/31/22   Raliegh Ip, DO  meclizine (ANTIVERT) 25 MG tablet     [provider]  polyvinyl alcohol (LIQUIFILM TEARS) 1.4 % ophthalmic solution Place 1  drop into both eyes as needed for dry eyes. 05/16/19   Saundra Shelling, MD    Physical Exam: BP 99/86   Pulse 92   Temp 98.7 F (37.1 C) (Oral)   Resp 20   Ht 5\' 6"  (1.676 m)   Wt 97.9 kg   SpO2 98%   BMI 34.83 kg/m   General: 77 y.o. year-old male well developed well nourished in no acute distress.  Alert and oriented x3. HEENT: NCAT, EOMI Neck: Supple, trachea medial Cardiovascular: Regular rate and rhythm with no rubs or gallops.  No thyromegaly or JVD noted.  No lower extremity edema. 2/4 pulses in all 4 extremities. Respiratory: Clear to auscultation with no wheezes or rales. Good inspiratory effort. Abdomen: Soft, nontender nondistended with normal bowel sounds x4 quadrants. Muskuloskeletal: No cyanosis, clubbing or edema noted bilaterally Neuro: CN II-XII intact, strength 5/5 x 4, sensation, reflexes intact Skin: No ulcerative lesions noted or rashes Psychiatry: Judgement and insight appear normal. Mood is  appropriate for condition and setting          Labs on Admission:  Basic Metabolic Panel: Recent Labs  Lab 10/06/22 1915  NA 127*  K 3.9  CL 94*  CO2 24  GLUCOSE 139*  BUN 31*  CREATININE 1.65*  CALCIUM 9.1   Liver Function Tests: Recent Labs  Lab 10/06/22 1915  AST 28  ALT 17  ALKPHOS 43  BILITOT 1.5*  PROT 7.4  ALBUMIN 3.9   No results for input(s): "LIPASE", "AMYLASE" in the last 168 hours. No results for input(s): "AMMONIA" in the last 168 hours. CBC: Recent Labs  Lab 10/06/22 1915  WBC 10.4  NEUTROABS 8.5*  HGB 12.8*  HCT 37.5*  MCV 90.6  PLT 109*   Cardiac Enzymes: No results for input(s): "CKTOTAL", "CKMB", "CKMBINDEX", "TROPONINI" in the last 168 hours.  BNP (last 3 results) Recent Labs    09/27/22 0302  BNP 245.0*    ProBNP (last 3 results) No results for input(s): "PROBNP" in the last 8760 hours.  CBG: No results for input(s): "GLUCAP" in the last 168 hours.  Radiological Exams on Admission: DG Chest Port 1 View  Result Date: 10/06/2022 CLINICAL DATA:  Shortness of breath. EXAM: PORTABLE CHEST 1 VIEW COMPARISON:  Chest radiograph dated 09/27/2022 and CT dated 09/27/2022. FINDINGS: No focal consolidation, pleural effusion, or pneumothorax. Stable mild cardiomegaly. No acute osseous pathology. Bilateral shoulder hemi arthroplasties. IMPRESSION: No active disease. Electronically Signed   By: Elgie Collard M.D.   On: 10/06/2022 19:25    EKG: I independently viewed the EKG done and my findings are as followed: ***   Assessment/Plan Present on Admission: . Hypotension  Principal Problem:   Hypotension   DVT prophylaxis: ***   Code Status: ***   Family Communication: ***   Disposition Plan: ***   Consults called: ***   Admission status: ***     Frankey Shown MD [facility] Pager [phone]  If 7PM-7AM, please contact night-coverage [email or web address] Password [facility]  10/06/2022, 11:57 PM         Review of  Systems: {ROS_Text:26778} Past Medical History:  Diagnosis Date  . Arthritis    knees, shoulders, elbows  . Bladder cancer [facility])    urologist-  dr Mena Goes  . Diverticulosis of colon   . Fibromyalgia   . GERD (gastroesophageal reflux disease)   . History of diverticulitis of colon   . History of gastric ulcer    due to aleve  . Hypertension   .  Lower urinary tract symptoms (LUTS)   . OSA (obstructive sleep apnea)    NON-COMPLIANT  CPAP  --- BUT PT USES OXYGEN AT NIGHT 2.5L VIA [place] (PT'S DECISION)  . PONV (postoperative nausea and vomiting)   . Psoriasis   . Sepsis [facility])    january 2021  . Tinnitus    right ear more, has tranmitter in right ear removable at hs  . Wears glasses   . Wears partial dentures    Past Surgical History:  Procedure Laterality Date  . APPENDECTOMY    . COLECTOMY W/ COLOSTOMY  1996   W/   APPENDECTOMY  . COLONOSCOPY N/A 05/11/2018   Procedure: COLONOSCOPY;  Surgeon: Corbin Ade, MD;  [facility];  Service: Endoscopy;  Laterality: N/A;  9:30  . COLOSTOMY TAKEDOWN  1996  . CYSTOSCOPY WITH BIOPSY N/A 12/05/2013   Procedure: CYSTO BLADDER BIOPSY AND FULGERATION;  Surgeon: Jerilee Field, MD;  Location: [facility];  Service: Urology;  Laterality: N/A;  . CYSTOSCOPY WITH BIOPSY Bilateral 11/13/2014   Procedure: CYSTOSCOPY WITH  BLADDER BIOPSY FULGERATION AND BILATERAL RETROGRADE PYELOGRAMS;  Surgeon: Jerilee Field, MD;  Location: [facility];  Service: Urology;  Laterality: Bilateral;  . CYSTOSCOPY WITH FULGERATION N/A 01/18/2018   Procedure: CYSTOSCOPY WITH FULGERATION/ BLADDER BIOPSY;  Surgeon: Jerilee Field, MD;  Location: [facility];  Service: Urology;  Laterality: N/A;  . CYSTOSCOPY WITH INJECTION N/A 11/09/2018   Procedure: CYSTOSCOPY WITH INJECTION OF INDOCYANINE GREEN DYE;  Surgeon: Sebastian Ache, MD;  [facility];  Service: Urology;  Laterality: N/A;  . CYSTOSCOPY WITH INSERTION  OF UROLIFT N/A 01/18/2018   Procedure: CYSTOSCOPY WITH INSERTION OF UROLIFT;  Surgeon: Jerilee Field, MD;  Location: [facility];  Service: Urology;  Laterality: N/A;  . CYSTOSCOPY/URETEROSCOPY/HOLMIUM LASER Left 02/17/2019   Procedure: URETEROSCOPY WITH BALLOON DILATION  AND NEPHROSTOGRAM;  Surgeon: Sebastian Ache, MD;  Location: [facility];  Service: Urology;  Laterality: Left;  1 HR  . EXCISION RIGHT UPPER ARM LIPOMA  2005  . HEMORROIDECTOMY    . INGUINAL HERNIA REPAIR Left 1984  . IR NEPHROSTOMY PLACEMENT LEFT  01/05/2019  . KNEE ARTHROSCOPY Left X3  LAST ONE  2002  . ORIF LEFT HUMEROUS FX  1976  . POLYPECTOMY  05/11/2018   Procedure: POLYPECTOMY;  Surgeon: Corbin Ade, MD;  [facility];  Service: Endoscopy;;  . PROSTATE SURGERY    . ROBOT ASSISTED LAPAROSCOPIC NEPHRECTOMY Left 07/14/2019   Procedure: XI ROBOTIC ASSISTED LAPAROSCOPIC RETROPERITONEAL NEPHRECTOMY;  Surgeon: Sebastian Ache, MD;  [facility];  Service: Urology;  Laterality: Left;  3 HRS  . TONSILLECTOMY  AS CHILD  . TOTAL KNEE ARTHROPLASTY Left 2006   REVISION 2007  (AFTER I & D WITH ANTIBIOTIC SPACER PROCEDURE FOR STEPH INFECTION)  . TOTAL KNEE ARTHROPLASTY Right 03/30/2016   Procedure: RIGHT TOTAL KNEE ARTHROPLASTY;  Surgeon: Durene Romans, MD;  [facility];  Service: Orthopedics;  Laterality: Right;  . TOTAL SHOULDER ARTHROPLASTY Right 04/06/2013   Procedure: RIGHT TOTAL SHOULDER ARTHROPLASTY;  Surgeon: Senaida Lange, MD;  [facility];  Service: Orthopedics;  Laterality: Right;  . TOTAL SHOULDER ARTHROPLASTY Left 07/20/2013   Procedure: LEFT TOTAL SHOULDER ARTHROPLASTY;  Surgeon: Senaida Lange, MD;  [facility];  Service: Orthopedics;  Laterality: Left;  . TRANSURETHRAL RESECTION OF BLADDER TUMOR N/A 11/12/2015   Procedure: TRANSURETHRAL RESECTION OF BLADDER TUMOR (TURBT);  Surgeon: Jerilee Field, MD;  Location:  [facility];  Service:  Urology;  Laterality: N/A;  . TRANSURETHRAL RESECTION OF BLADDER TUMOR WITH GYRUS (TURBT-GYRUS) N/A 12/05/2013   Procedure: TRANSURETHRAL RESECTION OF BLADDER TUMOR WITH GYRUS (TURBT-GYRUS);  Surgeon: Jerilee Field, MD;  Location: [facility];  Service: Urology;  Laterality: N/A;   Social History:  reports that he quit smoking about 25 years ago. His smoking use included cigarettes. He has a 80.00 pack-year smoking history. He has never used smokeless tobacco. He reports that he does not drink alcohol and does not use drugs.  No Known Allergies  Family History  Problem Relation Age of Onset  . Alzheimer's disease Mother   . Heart attack Father   . Heart disease Father   . Irregular heart beat Brother        DEFIB.  . Congestive Heart Failure Brother   . Diabetes Daughter     Prior to Admission medications   Medication Sig Start Date End Date Taking? Authorizing Provider  furosemide (LASIX) 20 MG tablet Take 1 tablet (20 mg total) by mouth daily. 09/27/22   Dione Booze, MD  levothyroxine (SYNTHROID) 25 MCG tablet Take 1 tablet (25 mcg total) by mouth daily before breakfast. Take on an empty stomach with water only.  Wait to consume other foods/ drinks/ meds for 30 minutes. 08/24/22   Raliegh Ip, DO  lisinopril (ZESTRIL) 5 MG tablet Take 1 tablet (5 mg total) by mouth daily. 07/31/22   Raliegh Ip, DO  meclizine (ANTIVERT) 25 MG tablet     [provider]  polyvinyl alcohol (LIQUIFILM TEARS) 1.4 % ophthalmic solution Place 1 drop into both eyes as needed for dry eyes. 05/16/19   Saundra Shelling, MD    Physical Exam: Vitals:   10/06/22 2120 10/06/22 2130 10/06/22 2200 10/06/22 2230  BP:  109/73 114/73 120/75  Pulse: 87 87 87 87  Resp: 20 20 20 20   Temp:      TempSrc:      SpO2: 98% 97% 97% 98%  Weight:      Height:       *** Data Reviewed: {Tip this will not be part of the note when signed- Document your independent interpretation of  telemetry tracing, EKG, lab, Radiology test or any other diagnostic tests. Add any new diagnostic test ordered today. (Optional):26781} {Results:26384}  Assessment and Plan: No notes have been filed under this hospital service. Service: Hospitalist     Advance Care Planning:   Code Status: Prior ***  Consults: ***  Family Communication: ***  Severity of Illness: {Observation/Inpatient:21159}  Author: Frankey Shown, DO 10/06/2022 11:28 PM  For on call review [email or web address].

## 2022-10-06 NOTE — ED Triage Notes (Signed)
Pt via POV c/o SOB x 3 weeks, low BP 90s/70s at home, fever last night up to 101.8 with sweats and chills, bloating, poor appetite, and decreased sense of taste. Pt denies pain.

## 2022-10-06 NOTE — Telephone Encounter (Signed)
Transition Care Management Unsuccessful Follow-up Telephone Call  Date of discharge and from where:  Billy Rasmussen   Attempts:  1st Attempt  Reason for unsuccessful TCM follow-up call:  Left voice message   [name] [facility] Guide, [facility] [phone] [address] [place], [place], [place] [address] Phone: [phone] Email: [email or web address]

## 2022-10-07 ENCOUNTER — Observation Stay (HOSPITAL_BASED_OUTPATIENT_CLINIC_OR_DEPARTMENT_OTHER): Payer: Medicare HMO

## 2022-10-07 DIAGNOSIS — E039 Hypothyroidism, unspecified: Secondary | ICD-10-CM | POA: Insufficient documentation

## 2022-10-07 DIAGNOSIS — I5020 Unspecified systolic (congestive) heart failure: Secondary | ICD-10-CM | POA: Diagnosis not present

## 2022-10-07 DIAGNOSIS — E871 Hypo-osmolality and hyponatremia: Secondary | ICD-10-CM | POA: Insufficient documentation

## 2022-10-07 DIAGNOSIS — D696 Thrombocytopenia, unspecified: Secondary | ICD-10-CM | POA: Insufficient documentation

## 2022-10-07 DIAGNOSIS — N3000 Acute cystitis without hematuria: Secondary | ICD-10-CM | POA: Diagnosis not present

## 2022-10-07 DIAGNOSIS — E861 Hypovolemia: Secondary | ICD-10-CM

## 2022-10-07 DIAGNOSIS — G4733 Obstructive sleep apnea (adult) (pediatric): Secondary | ICD-10-CM

## 2022-10-07 DIAGNOSIS — R7989 Other specified abnormal findings of blood chemistry: Secondary | ICD-10-CM | POA: Insufficient documentation

## 2022-10-07 DIAGNOSIS — I10 Essential (primary) hypertension: Secondary | ICD-10-CM | POA: Diagnosis not present

## 2022-10-07 DIAGNOSIS — I5023 Acute on chronic systolic (congestive) heart failure: Secondary | ICD-10-CM

## 2022-10-07 LAB — COMPREHENSIVE METABOLIC PANEL
ALT: 13 U/L (ref 0–44)
AST: 23 U/L (ref 15–41)
Albumin: 3.5 g/dL (ref 3.5–5.0)
Alkaline Phosphatase: 38 U/L (ref 38–126)
Anion gap: 7 (ref 5–15)
BUN: 27 mg/dL — ABNORMAL HIGH (ref 8–23)
CO2: 25 mmol/L (ref 22–32)
Calcium: 9.2 mg/dL (ref 8.9–10.3)
Chloride: 100 mmol/L (ref 98–111)
Creatinine, Ser: 1.47 mg/dL — ABNORMAL HIGH (ref 0.61–1.24)
GFR, Estimated: 50 mL/min — ABNORMAL LOW (ref >60)
Glucose, Bld: 95 mg/dL (ref 70–99)
Potassium: 4 mmol/L (ref 3.5–5.1)
Sodium: 132 mmol/L — ABNORMAL LOW (ref 135–145)
Total Bilirubin: 1.2 mg/dL (ref 0.3–1.2)
Total Protein: 6.9 g/dL (ref 6.5–8.1)

## 2022-10-07 LAB — MAGNESIUM: Magnesium: 2.1 mg/dL (ref 1.7–2.4)

## 2022-10-07 LAB — CBC
HCT: 38.7 % — ABNORMAL LOW (ref 39.0–52.0)
Hemoglobin: 12.7 g/dL — ABNORMAL LOW (ref 13.0–17.0)
MCH: 30.5 pg (ref 26.0–34.0)
MCHC: 32.8 g/dL (ref 30.0–36.0)
MCV: 93 fL (ref 80.0–100.0)
Platelets: 111 10*3/uL — ABNORMAL LOW (ref 150–400)
RBC: 4.16 MIL/uL — ABNORMAL LOW (ref 4.22–5.81)
RDW: 14.3 % (ref 11.5–15.5)
WBC: 8.8 10*3/uL (ref 4.0–10.5)
nRBC: 0 % (ref 0.0–0.2)

## 2022-10-07 LAB — BASIC METABOLIC PANEL
Anion gap: 8 (ref 5–15)
Anion gap: 7 (ref 5–15)
BUN: 26 mg/dL — ABNORMAL HIGH (ref 8–23)
BUN: 27 mg/dL — ABNORMAL HIGH (ref 8–23)
CO2: 23 mmol/L (ref 22–32)
CO2: 25 mmol/L (ref 22–32)
Calcium: 9.4 mg/dL (ref 8.9–10.3)
Calcium: 9.2 mg/dL (ref 8.9–10.3)
Chloride: 101 mmol/L (ref 98–111)
Chloride: 99 mmol/L (ref 98–111)
Creatinine, Ser: 1.3 mg/dL — ABNORMAL HIGH (ref 0.61–1.24)
Creatinine, Ser: 1.37 mg/dL — ABNORMAL HIGH (ref 0.61–1.24)
GFR, Estimated: 58 mL/min — ABNORMAL LOW (ref >60)
GFR, Estimated: 54 mL/min — ABNORMAL LOW (ref >60)
Glucose, Bld: 124 mg/dL — ABNORMAL HIGH (ref 70–99)
Glucose, Bld: 112 mg/dL — ABNORMAL HIGH (ref 70–99)
Potassium: 4.1 mmol/L (ref 3.5–5.1)
Potassium: 4.4 mmol/L (ref 3.5–5.1)
Sodium: 132 mmol/L — ABNORMAL LOW (ref 135–145)
Sodium: 131 mmol/L — ABNORMAL LOW (ref 135–145)

## 2022-10-07 LAB — ECHOCARDIOGRAM COMPLETE
Area-P 1/2: 8.07 cm2
Est EF: <20
Height: 66 in
S' Lateral: 5.7 cm
Weight: 3433.6 oz

## 2022-10-07 LAB — PHOSPHORUS: Phosphorus: 2 mg/dL — ABNORMAL LOW (ref 2.5–4.6)

## 2022-10-07 MED ORDER — PERFLUTREN LIPID MICROSPHERE
1.0000 mL | INTRAVENOUS | Status: AC | PRN
  Administered 2022-10-07: 8 mL via INTRAVENOUS

## 2022-10-07 MED ORDER — CIPROFLOXACIN IN D5W 400 MG/200ML IV SOLN
400.0000 mg | Freq: Two times a day (BID) | INTRAVENOUS | Status: DC
  Administered 2022-10-07 – 2022-10-09 (×6): 400 mg via INTRAVENOUS
  Filled 2022-10-07 (×7): qty 200

## 2022-10-07 MED ORDER — LEVOTHYROXINE SODIUM 25 MCG PO TABS
25.0000 ug | ORAL_TABLET | Freq: Every day | ORAL | Status: DC
  Administered 2022-10-07 – 2022-10-13 (×7): 25 ug via ORAL
  Filled 2022-10-07 (×7): qty 1

## 2022-10-07 NOTE — Progress Notes (Signed)
Progress Note   Patient: Billy Rasmussen ZOX:[id number] DOB: 04-02-1946 DOA: 10/06/2022     0 DOS: the patient was seen and examined on 10/07/2022   Brief hospital admission course: Billy Rasmussen is a 77 y.o. male with medical history significant of hypertension, hypothyroidism, history of bladder cancer status post left nephrectomy, cystoprostatectomy, ascending aortic aneurysm who presents to the emergency department due to 3-week onset of shortness of breath and generalized weakness.  Patient presents to the emergency department on 09/27/2022 where he was suspected to have congestive heart failure.  He followed up with cardiologist today and was noted to be hypotensive.  He complained of fever of 101.41F with chills and diaphoresis at home and also complained of poor appetite since onset of symptoms.   Recent visit to cardiology service as an outpatient with concerns for CHF development.  Due to the presence of fever, associated diaphoresis at home and generalized weakness patient referred to emergency department to further evaluate his condition.  Patient was found with UTI along with acute kidney injury on chronic kidney disease.  TRH consulted to place in the hospital for further evaluation and management.  Assessment and Plan: 1-UTI present at time of admission -Urinalysis not demonstrating acute infection process -Continue to maintain adequate hydration (judiciously given history concerning for CHF) -Continue IV antibiotics using ciprofloxacin and follow culture results/sensitivity -Patient is currently afebrile, reporting no nausea or vomiting. -Follow clinical response.  2-hypertension -Patient blood pressure was low at time of presentation to the ED with improvement after holding antihypertensive agents and provided fluid resuscitation -Continue to hold diuretics and lisinopril -Continue to follow vital sign -Maintain adequate hydration.  3-hyponatremia -127 at time of  admission -Appears to be in the setting of dehydration and recent use of diuretics -Patient is stable and volume compensated currently -Continue judicious fluid resuscitation and follow electrolytes trend -Continue holding diuretics.  4-chronic thrombocytopenia -Appears to be stable and at baseline -Continue to follow trend. -No overt bleeding appreciated.  5-hypothyroidism -Continue Synthroid.  6-CHF concerns -Recent visit to cardiology outpatient setting with concern for CHF -No prior cardiovascular workup on his records. -Currently denying orthopnea, shortness of breath or swelling. -2D echo performed during this hospitalization demonstrating ejection fraction of 20% -Case has been discussed with cardiology service with recommendation to transfer to [facility] for close cardiology service monitoring and left/right heart catheterization with further GDMT recommendations. -Low-sodium diet discussed with patient -Continue daily weights and strict I's and O's.  Subjective:  Reports feeling better; no chest pain, no orthopnea, no shortness of breath at the moment.  Patient is afebrile.  Denies dysuria.  Still expressing some frequency.  Physical Exam: Vitals:   10/07/22 0056 10/07/22 0431 10/07/22 0757 10/07/22 1037  BP: 115/77 113/79 112/77 114/68  Pulse: (!) 103 91 86 84  Resp: 18 (!) 22 17 18   Temp: 98.5 F (36.9 C) 98 F (36.7 C) 98.3 F (36.8 C) 98.1 F (36.7 C)  TempSrc: Oral Oral Oral Oral  SpO2: 100% 97% 100% 97%  Weight: 97.3 kg     Height: 5\' 6"  (1.676 m)      General exam: Alert, awake, oriented x 3; no chest pain, no nausea or vomiting. Respiratory system: Clear to auscultation. Respiratory effort normal.  Good saturation on room air. Cardiovascular system:RRR.  No rubs, no gallops, no JVD. Gastrointestinal system: Abdomen is nondistended, soft and nontender. No organomegaly or masses felt. Normal bowel sounds heard. Central nervous system: Alert and  oriented. No focal  neurological deficits. Extremities: No cyanosis, clubbing or edema. Skin: No petechiae. Psychiatry: Judgement and insight appear normal. Mood & affect appropriate.   Data Reviewed: 2D echo: Demonstrating ejection fraction less than 20%; global hypokinesis with septal dyssynergy and no significant valvular disorder.  Diastolic parameters are indeterminate. Basic metabolic panel: Sodium 131, potassium 4.4, chloride 99, bicarb 25, BUN 27, creatinine 1.37 and GFR 54 CBC: White blood cells 8.8, hemoglobin 12.7 and platelet count 111 K.  Family Communication: No family at bedside.   Disposition: Status is: Observation The patient remains OBS appropriate and will d/c before 2 midnights.   Planned Discharge Destination: Home   Time spent: 35 minutes  Author: Vassie Loll, MD 10/07/2022 5:06 PM  For on call review [email or web address].

## 2022-10-07 NOTE — Progress Notes (Signed)
Pt transferred to [facility] via [facility]. Alert and verbal at time of departure. Previous nurse called report to nurse at [facility].

## 2022-10-07 NOTE — Progress Notes (Signed)
  Echocardiogram 2D Echocardiogram has been performed.  Billy Rasmussen 10/07/2022, 2:44 PM

## 2022-10-07 NOTE — Consult Note (Signed)
Cardiology Consultation:   Patient ID: [name]; [id number]; 04-06-1946   Admit date: 10/06/2022 Date of Consult: 10/07/2022  Primary Care Provider: Raliegh Ip, Rasmussen Primary Cardiologist: Billy Guadalajara, MD  History of Present Illness:   Billy Rasmussen is a 77 y.o. male currently admitted to the hospital with recent fever on Tuesday and suspected UTI based on urinalysis with culture pending and on empiric antibiotics.  Over the last few months he has had worsening fatigue, dyspnea and chest tightness on exertion, intermittent orthopnea, also feeling of abdominal bloating.  He has been seen in the ER a few times for the symptoms with suspected CHF and started on Lasix.  He had an office visit with Billy Rasmussen yesterday with plan for outpatient echocardiogram, however this study was obtained here today and noted below.  He has no prior history of cardiomyopathy, present LVEF less than 20% with global hypokinesis and septal dyssynergy, also RV dysfunction.  Present high-sensitivity troponin I levels are not suggestive of ACS.  ECG shows left bundle branch block with generally widened QRS over time in comparison to previous tracings.  Current chest x-ray shows no pulmonary edema.  History includes OSA previously intolerant of CPAP but he does use oxygen at nighttime.  Reportedly has follow-up sleep testing pending.  Also hypertension, history of bladder cancer status post left nephrectomy and cystoprostatectomy.  He has an incidentally noted substernal goiter on the left by recent chest imaging and was to undergo a thyroid biopsy as an outpatient tomorrow.  TSH in April was normal at 2.09.  He is typically on lisinopril as an outpatient, was recently started on low-dose Lasix after his ER encounters with heart failure symptoms.  He presents with relatively low blood pressures and lisinopril is currently on hold.  Also has renal insufficiency, possibly CKD stage IIIb per review of laboratory  trend, creatinine is 1.3 today.  Mildly hyponatremic with sodium 132.  ROS:  Pertinent review in history of present illness.  No palpitations or syncope.  No leg swelling.  Past Medical History:  Diagnosis Date   Arthritis    knees, shoulders, elbows   Bladder cancer [facility])    urologist-  dr eskridge   Diverticulosis of colon    Fibromyalgia    GERD (gastroesophageal reflux disease)    History of diverticulitis of colon    History of gastric ulcer    due to aleve   Hypertension    Lower urinary tract symptoms (LUTS)    OSA (obstructive sleep apnea)    NON-COMPLIANT  CPAP  --- BUT PT USES OXYGEN AT NIGHT 2.5L VIA [place] (PT'S DECISION)   PONV (postoperative nausea and vomiting)    Psoriasis    Sepsis (HCC)    january 2021   Tinnitus    right ear more, has tranmitter in right ear removable at hs   Wears glasses    Wears partial dentures     Past Surgical History:  Procedure Laterality Date   APPENDECTOMY     COLECTOMY W/ COLOSTOMY  1996   W/   APPENDECTOMY   COLONOSCOPY N/A 05/11/2018   Procedure: COLONOSCOPY;  Surgeon: Billy Ade, MD;  [facility];  Service: Endoscopy;  Laterality: N/A;  9:30   COLOSTOMY TAKEDOWN  1996   CYSTOSCOPY WITH BIOPSY N/A 12/05/2013   Procedure: CYSTO BLADDER BIOPSY AND FULGERATION;  Surgeon: Billy Field, MD;  Location: [facility];  Service: Urology;  Laterality: N/A;   CYSTOSCOPY WITH BIOPSY  Bilateral 11/13/2014   Procedure: CYSTOSCOPY WITH  BLADDER BIOPSY FULGERATION AND BILATERAL RETROGRADE PYELOGRAMS;  Surgeon: Billy Field, MD;  Location: [facility];  Service: Urology;  Laterality: Bilateral;   CYSTOSCOPY WITH FULGERATION N/A 01/18/2018   Procedure: CYSTOSCOPY WITH FULGERATION/ BLADDER BIOPSY;  Surgeon: Billy Field, MD;  Location: [facility];  Service: Urology;  Laterality: N/A;   CYSTOSCOPY WITH INJECTION N/A 11/09/2018   Procedure: CYSTOSCOPY WITH INJECTION OF  INDOCYANINE GREEN DYE;  Surgeon: Billy Ache, MD;  Location: [facility];  Service: Urology;  Laterality: N/A;   CYSTOSCOPY WITH INSERTION OF UROLIFT N/A 01/18/2018   Procedure: CYSTOSCOPY WITH INSERTION OF UROLIFT;  Surgeon: Billy Field, MD;  Location: [facility];  Service: Urology;  Laterality: N/A;   CYSTOSCOPY/URETEROSCOPY/HOLMIUM LASER Left 02/17/2019   Procedure: URETEROSCOPY WITH BALLOON DILATION  AND NEPHROSTOGRAM;  Surgeon: Billy Ache, MD;  Location: [facility];  Service: Urology;  Laterality: Left;  1 HR   EXCISION RIGHT UPPER ARM LIPOMA  2005   HEMORROIDECTOMY     INGUINAL HERNIA REPAIR Left 1984   IR NEPHROSTOMY PLACEMENT LEFT  01/05/2019   KNEE ARTHROSCOPY Left X3  LAST ONE  2002   ORIF LEFT HUMEROUS FX  1976   POLYPECTOMY  05/11/2018   Procedure: POLYPECTOMY;  Surgeon: Billy Ade, MD;  [facility];  Service: Endoscopy;;   PROSTATE SURGERY     ROBOT ASSISTED LAPAROSCOPIC NEPHRECTOMY Left 07/14/2019   Procedure: XI ROBOTIC ASSISTED LAPAROSCOPIC RETROPERITONEAL NEPHRECTOMY;  Surgeon: Billy Ache, MD;  Location: [facility];  Service: Urology;  Laterality: Left;  3 HRS   TONSILLECTOMY  AS CHILD   TOTAL KNEE ARTHROPLASTY Left 2006   REVISION 2007  (AFTER I & D WITH ANTIBIOTIC SPACER PROCEDURE FOR STEPH INFECTION)   TOTAL KNEE ARTHROPLASTY Right 03/30/2016   Procedure: RIGHT TOTAL KNEE ARTHROPLASTY;  Surgeon: Billy Romans, MD;  Location: [facility];  Service: Orthopedics;  Laterality: Right;   TOTAL SHOULDER ARTHROPLASTY Right 04/06/2013   Procedure: RIGHT TOTAL SHOULDER ARTHROPLASTY;  Surgeon: Billy Lange, MD;  Location[facility];  Service: Orthopedics;  Laterality: Right;   TOTAL SHOULDER ARTHROPLASTY Left 07/20/2013   Procedure: LEFT TOTAL SHOULDER ARTHROPLASTY;  Surgeon: Billy Lange, MD;  Location: [facility];  Service: Orthopedics;  Laterality: Left;   TRANSURETHRAL RESECTION OF BLADDER TUMOR N/A 11/12/2015   Procedure: TRANSURETHRAL  RESECTION OF BLADDER TUMOR (TURBT);  Surgeon: Billy Field, MD;  Location: [facility];  Service: Urology;  Laterality: N/A;   TRANSURETHRAL RESECTION OF BLADDER TUMOR WITH GYRUS (TURBT-GYRUS) N/A 12/05/2013   Procedure: TRANSURETHRAL RESECTION OF BLADDER TUMOR WITH GYRUS (TURBT-GYRUS);  Surgeon: Billy Field, MD;  Location: [facility];  Service: Urology;  Laterality: N/A;     Inpatient Medications: Scheduled Meds:  enoxaparin (LOVENOX) injection  40 mg Subcutaneous Q24H   levothyroxine  25 mcg Oral Q0600   Continuous Infusions:  ciprofloxacin 400 mg (10/07/22 0842)   PRN Meds: acetaminophen **OR** acetaminophen, albuterol, ondansetron **OR** ondansetron (ZOFRAN) IV, perflutren lipid microspheres (DEFINITY) IV suspension  Allergies:   No Known Allergies  Social History:   Social History   Tobacco Use   Smoking status: Former    Packs/day: 2.00    Years: 40.00    Additional pack years: 0.00    Total pack years: 80.00    Types: Cigarettes    Quit date: 06/01/1997    Years since quitting: 25.3   Smokeless tobacco: Never  Substance Use  Topics   Alcohol use: No    Family History:   The patient's family history includes Alzheimer's disease in his mother; Congestive Heart Failure in his brother; Diabetes in his daughter; Heart attack in his father; Heart disease in his father; Irregular heart beat in his brother.  Physical Exam/Data:   Vitals:   10/07/22 0056 10/07/22 0431 10/07/22 0757 10/07/22 1037  BP: 115/77 113/79 112/77 114/68  Pulse: (!) 103 91 86 84  Resp: 18 (!) 22 17 18   Temp: 98.5 F (36.9 C) 98 F (36.7 C) 98.3 F (36.8 C) 98.1 F (36.7 C)  TempSrc: Oral Oral Oral Oral  SpO2: 100% 97% 100% 97%  Weight: 97.3 kg     Height: 5\' 6"  (1.676 m)       Intake/Output Summary (Last 24 hours) at 10/07/2022 1605 Last data filed at 10/07/2022 1500 Gross per 24 hour  Intake 1278.88 ml  Output 1500 ml  Net -221.12 ml   Filed Weights    10/06/22 1838 10/07/22 0056  Weight: 97.9 kg 97.3 kg   Body mass index is 34.64 kg/m.   Gen: Patient appears comfortable at rest. HEENT: Conjunctiva and lids normal, oropharynx clear. Neck: Supple, no elevated JVP or carotid bruits. Lungs: Clear to auscultation, nonlabored breathing at rest. Cardiac: Indistinct PMI, regular rate and rhythm, no S3 or significant systolic murmur, no pericardial rub. Abdomen: Soft, nontender, bowel sounds present. Extremities: No pitting edema, distal pulses 2+. Skin: Warm and dry. Musculoskeletal: No kyphosis. Neuropsychiatric: Alert and oriented x3, affect grossly appropriate.  Telemetry:  I personally reviewed telemetry which shows sinus rhythm.  Laboratory Data:  Chemistry Recent Labs  Lab 10/06/22 1915 10/07/22 0413 10/07/22 1107  NA 127* 132* 132*  K 3.9 4.0 4.1  CL 94* 100 101  CO2 24 25 23   GLUCOSE 139* 95 124*  BUN 31* 27* 26*  CREATININE 1.65* 1.47* 1.30*  CALCIUM 9.1 9.2 9.4  GFRNONAA 43* 50* 58*  ANIONGAP 9 7 8     Recent Labs  Lab 10/06/22 1915 10/07/22 0413  PROT 7.4 6.9  ALBUMIN 3.9 3.5  AST 28 23  ALT 17 13  ALKPHOS 43 38  BILITOT 1.5* 1.2   Hematology Recent Labs  Lab 10/06/22 1915 10/07/22 0413  WBC 10.4 8.8  RBC 4.14* 4.16*  HGB 12.8* 12.7*  HCT 37.5* 38.7*  MCV 90.6 93.0  MCH 30.9 30.5  MCHC 34.1 32.8  RDW 14.5 14.3  PLT 109* 111*   Cardiac Enzymes Recent Labs  Lab 09/12/22 0958 09/27/22 0302 09/27/22 0550 10/06/22 1915 10/06/22 2102  TROPONINIHS 24* 27* 25* 31* 28*   DDimer Recent Labs  Lab 10/06/22 1915  DDIMER 0.47    Lipid Panel     Component Value Date/Time   CHOL 152 07/07/2022 0916   TRIG 171 (H) 07/07/2022 0916   HDL 28 (L) 07/07/2022 0916   CHOLHDL 5.4 (H) 07/07/2022 0916   LDLCALC 94 07/07/2022 0916   LDLDIRECT 96 02/25/2016 1352   LABVLDL 30 07/07/2022 0916    Radiology/Studies:  ECHOCARDIOGRAM COMPLETE  Result Date: 10/07/2022    ECHOCARDIOGRAM REPORT   Patient  Name:   Billy Rasmussen Date of Exam: 10/07/2022 Medical Rec #:  [id number]        Height:       66.0 in Accession #:    [id number]       Weight:       214.6 lb Date of Birth:  Nov 28, 1945  BSA:          2.061 m Patient Age:    76 years         BP:           114/68 mmHg Patient Gender: M                HR:           93 bpm. [facility] Procedure: 2D Echo, Cardiac Doppler, Color Doppler and Intracardiac            Opacification Agent Indications:    Congestive Heart Failure I50.9  History:        Patient has prior history of Echocardiogram examinations, most                 recent 04/14/2019. Signs/Symptoms:Hypotension and Shortness of                 Breath; Risk Factors:Hypertension, Former Smoker and Sleep                 Apnea.  Sonographer:    Aron Baba Referring Phys: [phone] CARLOS MADERA  Sonographer Comments: Image acquisition challenging due to patient body habitus. IMPRESSIONS  1. Left ventricular ejection fraction, by estimation, is <20%. The left ventricle has severely decreased function. The left ventricle demonstrates global hypokinesis with septal dyssynergy. The left ventricular internal cavity size was mildly to moderately dilated. There is mild left ventricular hypertrophy. Left ventricular diastolic parameters are indeterminate.  2. Definity contrast shows loosely organized thrombotic material and slow flow in the apical inferolateral distribution, but no formed thrombus.  3. Right ventricular systolic function is mildly reduced. The right ventricular size is normal. There is moderately elevated pulmonary artery systolic pressure. The estimated right ventricular systolic pressure is 44.0 mmHg.  4. Left atrial size was severely dilated.  5. The mitral valve is degenerative. Mild mitral valve regurgitation.  6. The aortic valve was not well visualized. There is mild calcification of the aortic valve. Aortic valve regurgitation is not visualized.  7. Aortic dilatation noted. There  is mild dilatation of the aortic root, measuring 41 mm. There is moderate dilatation of the ascending aorta, measuring 45 mm.  8. The inferior vena cava is dilated in size with >50% respiratory variability, suggesting right atrial pressure of 8 mmHg. Comparison(s): Prior images reviewed side by side. LVEF has decreased substantially compared with prior study, now <20%. FINDINGS  Left Ventricle: Left ventricular ejection fraction, by estimation, is <20%. The left ventricle has severely decreased function. The left ventricle demonstrates global hypokinesis. Definity contrast agent was given IV to delineate the left ventricular endocardial borders. The left ventricular internal cavity size was mildly to moderately dilated. There is mild left ventricular hypertrophy. Abnormal (paradoxical) septal motion, consistent with left bundle branch block. Left ventricular diastolic parameters are indeterminate. Right Ventricle: The right ventricular size is normal. No increase in right ventricular wall thickness. Right ventricular systolic function is mildly reduced. There is moderately elevated pulmonary artery systolic pressure. The tricuspid regurgitant velocity is 3.00 m/s, and with an assumed right atrial pressure of 8 mmHg, the estimated right ventricular systolic pressure is 44.0 mmHg. Left Atrium: Left atrial size was severely dilated. Right Atrium: Right atrial size was normal in size. Pericardium: There is no evidence of pericardial effusion. Mitral Valve: The mitral valve is degenerative in appearance. There is mild thickening of the mitral valve leaflet(s). There is mild calcification of the mitral valve leaflet(s). Mild mitral  annular calcification. Mild mitral valve regurgitation. Tricuspid Valve: The tricuspid valve is grossly normal. Tricuspid valve regurgitation is mild. Aortic Valve: The aortic valve was not well visualized. There is mild calcification of the aortic valve. There is mild aortic valve annular  calcification. Aortic valve regurgitation is not visualized. Pulmonic Valve: The pulmonic valve was not well visualized. Pulmonic valve regurgitation is not visualized. Aorta: Aortic dilatation noted. There is mild dilatation of the aortic root, measuring 41 mm. There is moderate dilatation of the ascending aorta, measuring 45 mm. Venous: The inferior vena cava is dilated in size with greater than 50% respiratory variability, suggesting right atrial pressure of 8 mmHg. IAS/Shunts: No atrial level shunt detected by color flow Doppler.  LEFT VENTRICLE PLAX 2D LVIDd:         6.10 cm   Diastology LVIDs:         5.70 cm   LV e' medial:    9.30 cm/s LV PW:         1.10 cm   LV E/e' medial:  18.8 LV IVS:        1.00 cm   LV e' lateral:   14.70 cm/s LVOT diam:     2.00 cm   LV E/e' lateral: 11.9 LV SV:         50 LV SV Index:   24 LVOT Area:     3.14 cm  RIGHT VENTRICLE RV S prime:     9.39 cm/s TAPSE (M-mode): 1.9 cm LEFT ATRIUM              Index        RIGHT ATRIUM           Index LA diam:        4.60 cm  2.23 cm/m   RA Area:     23.30 cm LA Vol (A2C):   113.0 ml 54.84 ml/m  RA Volume:   60.90 ml  29.55 ml/m LA Vol (A4C):   109.0 ml 52.90 ml/m LA Biplane Vol: 111.0 ml 53.87 ml/m  AORTIC VALVE LVOT Vmax:   96.00 cm/s LVOT Vmean:  59.200 cm/s LVOT VTI:    0.159 m  AORTA Ao Root diam: 4.00 cm Ao Asc diam:  4.50 cm MITRAL VALVE                TRICUSPID VALVE MV Area (PHT): 8.07 cm     TR Peak grad:   36.0 mmHg MV Decel Time: 94 msec      TR Vmax:        300.00 cm/s MV E velocity: 175.00 cm/s                             SHUNTS                             Systemic VTI:  0.16 m                             Systemic Diam: 2.00 cm Nona Dell MD Electronically signed by Nona Dell MD Signature Date/Time: 10/07/2022/2:57:01 PM    Final    DG Chest Port 1 View  Result Date: 10/06/2022 CLINICAL DATA:  Shortness of breath. EXAM: PORTABLE CHEST 1 VIEW COMPARISON:  Chest radiograph dated 09/27/2022 and CT dated  09/27/2022. FINDINGS: No focal consolidation, pleural effusion, or pneumothorax.  Stable mild cardiomegaly. No acute osseous pathology. Bilateral shoulder hemi arthroplasties. IMPRESSION: No active disease. Electronically Signed   By: Elgie Collard M.D.   On: 10/06/2022 19:25    Assessment and Plan:   1.  HFrEF, possibly subacute with symptoms noted over the last few months.  LVEF less than 20% by echocardiogram today, septal dyssynergy noted with left bundle branch block by ECG, also RV dysfunction.  Definity shows loosely organized thrombotic material and slow flow, but no formed LV mural thrombus.  Presently on no specific GDMT, had been on lisinopril as an outpatient but this is currently held in the setting of relatively low blood pressure at presentation.  Chest x-ray shows no pulmonary edema.  Cardiac enzymes argue against ACS.  Does report COVID-19 exposure in the last few months, but reportedly negative testing.  He has no prior history of cardiomyopathy or ACS.  No history of cardiac arrhythmia.  2. Currently admitted with UTI, cultures pending.  He is on empiric ciprofloxacin per primary team, currently afebrile and with normal white blood cell count.  3.  Renal insufficiency, likely CKD stage IIIb at baseline.  Creatinine has come down from 1.65-1.30.  4.  Incidentally noted substernal goiter on the left by recent chest imaging with plan for outpatient thyroid biopsy tomorrow as an outpatient, will need to be rescheduled for now.  Recent TSH normal.  5.  History of OSA intolerant of CPAP, uses oxygen via nasal cannula at nighttime.  Reportedly was to undergo further sleep testing as an outpatient.  6.  Essential hypertension by history.  Situation discussed with the patient, also reviewed with Dr. Gwenlyn Perking and Billy Rasmussen.  Would plan for further cardiac workup as an inpatient given substantial LV dysfunction and recent heart failure symptoms, also minimal GDMT at baseline and present  hemodynamics.  Would plan to move him to the hospitalist service at [facility], cardiology will continue to follow.  Continue to treat UTI and suspect he will likely be able to undergo a right heart catheterization and coronaries by Friday presuming he remains afebrile and cultures are not concerning.  Depending on these results, cardiac MRI may also be useful subsequently as part of his workup.  Would not diurese now, follow-up urine output and renal function.  Hold off on beta-blocker, ARB/ARNI, and MRA for now.  No SGLT2 inhibitor as yet given UTI.  GDMT can be gradually added once catheterization data available.  For questions or updates, please contact [facility] Please consult [email or web address] for contact info under   Signed, Nona Dell, MD  10/07/2022 4:05 PM

## 2022-10-07 NOTE — Progress Notes (Signed)
MD at bedside. 

## 2022-10-07 NOTE — TOC Progression Note (Signed)
Transition of Care [facility]) - Progression Note    Patient Details  Name: REID BASTYR MRN: [id number] Date of Birth: Sep 15, 1945  Transition of Care [facility]) CM/SW Contact  Karn Cassis, [place] Phone Number: 10/07/2022, 10:39 AM  Clinical Narrative:  Transition of Care [facility]) Screening Note   Patient Details  Name: ANDONI CALMA Date of Birth: May 07, 1946   Transition of Care [facility]) CM/SW Contact:    Karn Cassis, LCSW Phone Number: 10/07/2022, 10:39 AM    Transition of Care Department [facility]) has reviewed patient and no TOC needs have been identified at this time. We will continue to monitor patient advancement through interdisciplinary progression rounds. If new patient transition needs arise, please place a TOC consult.          Barriers to Discharge: Continued Medical Work up  Expected Discharge Plan and Services                                               Social Determinants of Health (SDOH) Interventions SDOH Screenings   Food Insecurity: No Food Insecurity (10/07/2022)  Housing: Low Risk  (10/07/2022)  Transportation Needs: No Transportation Needs (10/07/2022)  Utilities: Not At Risk (10/07/2022)  Alcohol Screen: Low Risk  (08/14/2022)  Depression (PHQ2-9): Low Risk  (09/16/2022)  Financial Resource Strain: Low Risk  (08/14/2022)  Physical Activity: Inactive (08/14/2022)  Social Connections: Moderately Integrated (08/14/2022)  Stress: No Stress Concern Present (08/14/2022)  Tobacco Use: Medium Risk (10/06/2022)    Readmission Risk Interventions     No data to display

## 2022-10-08 ENCOUNTER — Inpatient Hospital Stay: Admission: RE | Admit: 2022-10-08 | Payer: Medicare HMO | Source: Ambulatory Visit

## 2022-10-08 DIAGNOSIS — Z79899 Other long term (current) drug therapy: Secondary | ICD-10-CM | POA: Diagnosis not present

## 2022-10-08 DIAGNOSIS — Z1619 Resistance to other specified beta lactam antibiotics: Secondary | ICD-10-CM | POA: Diagnosis present

## 2022-10-08 DIAGNOSIS — I5021 Acute systolic (congestive) heart failure: Secondary | ICD-10-CM | POA: Diagnosis not present

## 2022-10-08 DIAGNOSIS — E871 Hypo-osmolality and hyponatremia: Secondary | ICD-10-CM | POA: Diagnosis present

## 2022-10-08 DIAGNOSIS — I429 Cardiomyopathy, unspecified: Secondary | ICD-10-CM | POA: Diagnosis present

## 2022-10-08 DIAGNOSIS — D696 Thrombocytopenia, unspecified: Secondary | ICD-10-CM | POA: Diagnosis present

## 2022-10-08 DIAGNOSIS — I2510 Atherosclerotic heart disease of native coronary artery without angina pectoris: Secondary | ICD-10-CM | POA: Diagnosis present

## 2022-10-08 DIAGNOSIS — I5023 Acute on chronic systolic (congestive) heart failure: Secondary | ICD-10-CM | POA: Diagnosis present

## 2022-10-08 DIAGNOSIS — E039 Hypothyroidism, unspecified: Secondary | ICD-10-CM | POA: Diagnosis present

## 2022-10-08 DIAGNOSIS — Z96653 Presence of artificial knee joint, bilateral: Secondary | ICD-10-CM | POA: Diagnosis present

## 2022-10-08 DIAGNOSIS — R7989 Other specified abnormal findings of blood chemistry: Secondary | ICD-10-CM | POA: Diagnosis not present

## 2022-10-08 DIAGNOSIS — I10 Essential (primary) hypertension: Secondary | ICD-10-CM | POA: Diagnosis not present

## 2022-10-08 DIAGNOSIS — I7121 Aneurysm of the ascending aorta, without rupture: Secondary | ICD-10-CM | POA: Diagnosis present

## 2022-10-08 DIAGNOSIS — N1831 Chronic kidney disease, stage 3a: Secondary | ICD-10-CM

## 2022-10-08 DIAGNOSIS — E049 Nontoxic goiter, unspecified: Secondary | ICD-10-CM | POA: Diagnosis present

## 2022-10-08 DIAGNOSIS — R0602 Shortness of breath: Secondary | ICD-10-CM | POA: Diagnosis present

## 2022-10-08 DIAGNOSIS — I272 Pulmonary hypertension, unspecified: Secondary | ICD-10-CM | POA: Diagnosis present

## 2022-10-08 DIAGNOSIS — E861 Hypovolemia: Secondary | ICD-10-CM | POA: Diagnosis not present

## 2022-10-08 DIAGNOSIS — M797 Fibromyalgia: Secondary | ICD-10-CM | POA: Diagnosis present

## 2022-10-08 DIAGNOSIS — I5041 Acute combined systolic (congestive) and diastolic (congestive) heart failure: Secondary | ICD-10-CM | POA: Diagnosis not present

## 2022-10-08 DIAGNOSIS — Z1152 Encounter for screening for COVID-19: Secondary | ICD-10-CM | POA: Diagnosis not present

## 2022-10-08 DIAGNOSIS — N179 Acute kidney failure, unspecified: Secondary | ICD-10-CM | POA: Diagnosis present

## 2022-10-08 DIAGNOSIS — I5020 Unspecified systolic (congestive) heart failure: Secondary | ICD-10-CM | POA: Diagnosis not present

## 2022-10-08 DIAGNOSIS — Z936 Other artificial openings of urinary tract status: Secondary | ICD-10-CM | POA: Diagnosis not present

## 2022-10-08 DIAGNOSIS — N390 Urinary tract infection, site not specified: Secondary | ICD-10-CM | POA: Diagnosis not present

## 2022-10-08 DIAGNOSIS — I130 Hypertensive heart and chronic kidney disease with heart failure and stage 1 through stage 4 chronic kidney disease, or unspecified chronic kidney disease: Secondary | ICD-10-CM | POA: Diagnosis present

## 2022-10-08 DIAGNOSIS — N1832 Chronic kidney disease, stage 3b: Secondary | ICD-10-CM | POA: Diagnosis present

## 2022-10-08 DIAGNOSIS — Z8249 Family history of ischemic heart disease and other diseases of the circulatory system: Secondary | ICD-10-CM | POA: Diagnosis not present

## 2022-10-08 DIAGNOSIS — D631 Anemia in chronic kidney disease: Secondary | ICD-10-CM | POA: Diagnosis present

## 2022-10-08 DIAGNOSIS — Z87891 Personal history of nicotine dependence: Secondary | ICD-10-CM | POA: Diagnosis not present

## 2022-10-08 DIAGNOSIS — I959 Hypotension, unspecified: Secondary | ICD-10-CM | POA: Diagnosis present

## 2022-10-08 DIAGNOSIS — N3000 Acute cystitis without hematuria: Secondary | ICD-10-CM | POA: Diagnosis not present

## 2022-10-08 DIAGNOSIS — E669 Obesity, unspecified: Secondary | ICD-10-CM | POA: Diagnosis present

## 2022-10-08 DIAGNOSIS — I5043 Acute on chronic combined systolic (congestive) and diastolic (congestive) heart failure: Secondary | ICD-10-CM | POA: Diagnosis not present

## 2022-10-08 LAB — BASIC METABOLIC PANEL
Anion gap: 6 (ref 5–15)
BUN: 25 mg/dL — ABNORMAL HIGH (ref 8–23)
CO2: 24 mmol/L (ref 22–32)
Calcium: 9.6 mg/dL (ref 8.9–10.3)
Chloride: 104 mmol/L (ref 98–111)
Creatinine, Ser: 1.35 mg/dL — ABNORMAL HIGH (ref 0.61–1.24)
GFR, Estimated: 55 mL/min — ABNORMAL LOW (ref >60)
Glucose, Bld: 98 mg/dL (ref 70–99)
Potassium: 4 mmol/L (ref 3.5–5.1)
Sodium: 134 mmol/L — ABNORMAL LOW (ref 135–145)

## 2022-10-08 MED ORDER — SODIUM CHLORIDE 0.9 % IV SOLN: INTRAVENOUS | Status: DC | PRN

## 2022-10-08 MED ORDER — SODIUM CHLORIDE 0.9 % IV SOLN: INTRAVENOUS | Status: DC

## 2022-10-08 MED ORDER — ASPIRIN 81 MG PO CHEW
81.0000 mg | CHEWABLE_TABLET | ORAL | Status: AC
  Administered 2022-10-09: 81 mg via ORAL
  Filled 2022-10-08: qty 1

## 2022-10-08 MED ORDER — SODIUM CHLORIDE 0.9 % IV SOLN: 250.0000 mL | INTRAVENOUS | Status: DC | PRN

## 2022-10-08 MED ORDER — SODIUM CHLORIDE 0.9% FLUSH: 3.0000 mL | INTRAVENOUS | Status: DC | PRN

## 2022-10-08 MED ORDER — SODIUM CHLORIDE 0.9% FLUSH
3.0000 mL | Freq: Two times a day (BID) | INTRAVENOUS | Status: DC
  Administered 2022-10-09: 3 mL via INTRAVENOUS

## 2022-10-08 MED ORDER — SPIRONOLACTONE 12.5 MG HALF TABLET
12.5000 mg | ORAL_TABLET | Freq: Every day | ORAL | Status: DC
  Administered 2022-10-08 – 2022-10-10 (×2): 12.5 mg via ORAL
  Filled 2022-10-08 (×2): qty 1

## 2022-10-08 NOTE — Progress Notes (Signed)
Rounding Note    Patient Name: Billy Rasmussen Date of Encounter: 10/08/2022  [facility] Cardiologist: Nicki Guadalajara, MD   Subjective   Feeling okay today. No chest pain or SOB.  Inpatient Medications    Scheduled Meds:  enoxaparin (LOVENOX) injection  40 mg Subcutaneous Q24H   levothyroxine  25 mcg Oral Q0600   Continuous Infusions:  sodium chloride 10 mL/hr at 10/08/22 0859   ciprofloxacin 400 mg (10/08/22 0901)   PRN Meds: sodium chloride, acetaminophen **OR** acetaminophen, albuterol, ondansetron **OR** ondansetron (ZOFRAN) IV   Vital Signs    Vitals:   10/07/22 2031 10/08/22 0032 10/08/22 0321 10/08/22 0747  BP: 133/84 131/76 113/86 123/78  Pulse:  90 90 86  Resp: 18 17 18 18   Temp: 98.3 F (36.8 C) 98.6 F (37 C) 98.6 F (37 C) 97.7 F (36.5 C)  TempSrc: Oral Oral Oral Oral  SpO2: 98% 100% 98% 97%  Weight:      Height:        Intake/Output Summary (Last 24 hours) at 10/08/2022 1024 Last data filed at 10/08/2022 0858 Gross per 24 hour  Intake 880 ml  Output --  Net 880 ml      10/07/2022    8:00 PM 10/07/2022   12:56 AM 10/06/2022    6:38 PM  Last 3 Weights  Weight (lbs) 211 lb 214 lb 9.6 oz 215 lb 12.8 oz  Weight (kg) 95.709 kg 97.342 kg 97.886 kg      Telemetry    NSR with PVCs - Personally Reviewed  ECG    NSR, first degree AVB, IVCD - Personally Reviewed  Physical Exam   GEN: No acute distress.   Neck: No JVD Cardiac: RRR, no murmurs, rubs, or gallops.  Respiratory: Clear to auscultation bilaterally. GI: Soft, nontender, non-distended  MS: Trace pedal edema, warm Neuro:  Nonfocal  Psych: Normal affect   Labs    High Sensitivity Troponin:   Recent Labs  Lab 09/12/22 0958 09/27/22 0302 09/27/22 0550 10/06/22 1915 10/06/22 2102  TROPONINIHS 24* 27* 25* 31* 28*     Chemistry Recent Labs  Lab 10/06/22 1915 10/07/22 0413 10/07/22 1107 10/07/22 1617 10/08/22 0354  NA 127* 132* 132* 131* 134*  K 3.9 4.0 4.1 4.4  4.0  CL 94* 100 101 99 104  CO2 24 25 23 25 24   GLUCOSE 139* 95 124* 112* 98  BUN 31* 27* 26* 27* 25*  CREATININE 1.65* 1.47* 1.30* 1.37* 1.35*  CALCIUM 9.1 9.2 9.4 9.2 9.6  MG  --  2.1  --   --   --   PROT 7.4 6.9  --   --   --   ALBUMIN 3.9 3.5  --   --   --   AST 28 23  --   --   --   ALT 17 13  --   --   --   ALKPHOS 43 38  --   --   --   BILITOT 1.5* 1.2  --   --   --   GFRNONAA 43* 50* 58* 54* 55*  ANIONGAP 9 7 8 7 6     Lipids No results for input(s): "CHOL", "TRIG", "HDL", "LABVLDL", "LDLCALC", "CHOLHDL" in the last 168 hours.  Hematology Recent Labs  Lab 10/06/22 1915 10/07/22 0413  WBC 10.4 8.8  RBC 4.14* 4.16*  HGB 12.8* 12.7*  HCT 37.5* 38.7*  MCV 90.6 93.0  MCH 30.9 30.5  MCHC 34.1 32.8  RDW 14.5  14.3  PLT 109* 111*   Thyroid No results for input(s): "TSH", "FREET4" in the last 168 hours.  BNPNo results for input(s): "BNP", "PROBNP" in the last 168 hours.  DDimer  Recent Labs  Lab 10/06/22 1915  DDIMER 0.47     Radiology    ECHOCARDIOGRAM COMPLETE  Result Date: 10/07/2022    ECHOCARDIOGRAM REPORT   Patient Name:   Billy Rasmussen Date of Exam: 10/07/2022 Medical Rec #:  [id number]        Height:       66.0 in Accession #:    [id number]       Weight:       214.6 lb Date of Birth:  10/30/1945         BSA:          2.061 m Patient Age:    77 years         BP:           114/68 mmHg Patient Gender: M                HR:           93 bpm. [facility] Procedure: 2D Echo, Cardiac Doppler, Color Doppler and Intracardiac            Opacification Agent Indications:    Congestive Heart Failure I50.9  History:        Patient has prior history of Echocardiogram examinations, most                 recent 04/14/2019. Signs/Symptoms:Hypotension and Shortness of                 Breath; Risk Factors:Hypertension, Former Smoker and Sleep                 Apnea.  Sonographer:    Aron Baba Referring Phys: [phone] CARLOS MADERA  Sonographer Comments: Image acquisition  challenging due to patient body habitus. IMPRESSIONS  1. Left ventricular ejection fraction, by estimation, is <20%. The left ventricle has severely decreased function. The left ventricle demonstrates global hypokinesis with septal dyssynergy. The left ventricular internal cavity size was mildly to moderately dilated. There is mild left ventricular hypertrophy. Left ventricular diastolic parameters are indeterminate.  2. Definity contrast shows loosely organized thrombotic material and slow flow in the apical inferolateral distribution, but no formed thrombus.  3. Right ventricular systolic function is mildly reduced. The right ventricular size is normal. There is moderately elevated pulmonary artery systolic pressure. The estimated right ventricular systolic pressure is 44.0 mmHg.  4. Left atrial size was severely dilated.  5. The mitral valve is degenerative. Mild mitral valve regurgitation.  6. The aortic valve was not well visualized. There is mild calcification of the aortic valve. Aortic valve regurgitation is not visualized.  7. Aortic dilatation noted. There is mild dilatation of the aortic root, measuring 41 mm. There is moderate dilatation of the ascending aorta, measuring 45 mm.  8. The inferior vena cava is dilated in size with >50% respiratory variability, suggesting right atrial pressure of 8 mmHg. Comparison(s): Prior images reviewed side by side. LVEF has decreased substantially compared with prior study, now <20%. FINDINGS  Left Ventricle: Left ventricular ejection fraction, by estimation, is <20%. The left ventricle has severely decreased function. The left ventricle demonstrates global hypokinesis. Definity contrast agent was given IV to delineate the left ventricular endocardial borders. The left ventricular internal cavity size was mildly to moderately dilated. There is  mild left ventricular hypertrophy. Abnormal (paradoxical) septal motion, consistent with left bundle branch block. Left  ventricular diastolic parameters are indeterminate. Right Ventricle: The right ventricular size is normal. No increase in right ventricular wall thickness. Right ventricular systolic function is mildly reduced. There is moderately elevated pulmonary artery systolic pressure. The tricuspid regurgitant velocity is 3.00 m/s, and with an assumed right atrial pressure of 8 mmHg, the estimated right ventricular systolic pressure is 44.0 mmHg. Left Atrium: Left atrial size was severely dilated. Right Atrium: Right atrial size was normal in size. Pericardium: There is no evidence of pericardial effusion. Mitral Valve: The mitral valve is degenerative in appearance. There is mild thickening of the mitral valve leaflet(s). There is mild calcification of the mitral valve leaflet(s). Mild mitral annular calcification. Mild mitral valve regurgitation. Tricuspid Valve: The tricuspid valve is grossly normal. Tricuspid valve regurgitation is mild. Aortic Valve: The aortic valve was not well visualized. There is mild calcification of the aortic valve. There is mild aortic valve annular calcification. Aortic valve regurgitation is not visualized. Pulmonic Valve: The pulmonic valve was not well visualized. Pulmonic valve regurgitation is not visualized. Aorta: Aortic dilatation noted. There is mild dilatation of the aortic root, measuring 41 mm. There is moderate dilatation of the ascending aorta, measuring 45 mm. Venous: The inferior vena cava is dilated in size with greater than 50% respiratory variability, suggesting right atrial pressure of 8 mmHg. IAS/Shunts: No atrial level shunt detected by color flow Doppler.  LEFT VENTRICLE PLAX 2D LVIDd:         6.10 cm   Diastology LVIDs:         5.70 cm   LV e' medial:    9.30 cm/s LV PW:         1.10 cm   LV E/e' medial:  18.8 LV IVS:        1.00 cm   LV e' lateral:   14.70 cm/s LVOT diam:     2.00 cm   LV E/e' lateral: 11.9 LV SV:         50 LV SV Index:   24 LVOT Area:     3.14 cm   RIGHT VENTRICLE RV S prime:     9.39 cm/s TAPSE (M-mode): 1.9 cm LEFT ATRIUM              Index        RIGHT ATRIUM           Index LA diam:        4.60 cm  2.23 cm/m   RA Area:     23.30 cm LA Vol (A2C):   113.0 ml 54.84 ml/m  RA Volume:   60.90 ml  29.55 ml/m LA Vol (A4C):   109.0 ml 52.90 ml/m LA Biplane Vol: 111.0 ml 53.87 ml/m  AORTIC VALVE LVOT Vmax:   96.00 cm/s LVOT Vmean:  59.200 cm/s LVOT VTI:    0.159 m  AORTA Ao Root diam: 4.00 cm Ao Asc diam:  4.50 cm MITRAL VALVE                TRICUSPID VALVE MV Area (PHT): 8.07 cm     TR Peak grad:   36.0 mmHg MV Decel Time: 94 msec      TR Vmax:        300.00 cm/s MV E velocity: 175.00 cm/s  SHUNTS                             Systemic VTI:  0.16 m                             Systemic Diam: 2.00 cm Nona Dell MD Electronically signed by Nona Dell MD Signature Date/Time: 10/07/2022/2:57:01 PM    Final    DG Chest Port 1 View  Result Date: 10/06/2022 CLINICAL DATA:  Shortness of breath. EXAM: PORTABLE CHEST 1 VIEW COMPARISON:  Chest radiograph dated 09/27/2022 and CT dated 09/27/2022. FINDINGS: No focal consolidation, pleural effusion, or pneumothorax. Stable mild cardiomegaly. No acute osseous pathology. Bilateral shoulder hemi arthroplasties. IMPRESSION: No active disease. Electronically Signed   By: Elgie Collard M.D.   On: 10/06/2022 19:25    Cardiac Studies   TTE 10/07/22: IMPRESSIONS     1. Left ventricular ejection fraction, by estimation, is <20%. The left  ventricle has severely decreased function. The left ventricle demonstrates  global hypokinesis with septal dyssynergy. The left ventricular internal  cavity size was mildly to  moderately dilated. There is mild left ventricular hypertrophy. Left  ventricular diastolic parameters are indeterminate.   2. Definity contrast shows loosely organized thrombotic material and slow  flow in the apical inferolateral distribution, but no formed thrombus.    3. Right ventricular systolic function is mildly reduced. The right  ventricular size is normal. There is moderately elevated pulmonary artery  systolic pressure. The estimated right ventricular systolic pressure is  44.0 mmHg.   4. Left atrial size was severely dilated.   5. The mitral valve is degenerative. Mild mitral valve regurgitation.   6. The aortic valve was not well visualized. There is mild calcification  of the aortic valve. Aortic valve regurgitation is not visualized.   7. Aortic dilatation noted. There is mild dilatation of the aortic root,  measuring 41 mm. There is moderate dilatation of the ascending aorta,  measuring 45 mm.   8. The inferior vena cava is dilated in size with >50% respiratory  variability, suggesting right atrial pressure of 8 mmHg.   Comparison(s): Prior images reviewed side by side. LVEF has decreased  substantially compared with prior study, now <20%.   Patient Profile     77 y.o. male with history of  bladder cancer s/p left nephrectomy, cystoprostatectomy , fibromyalgia, hypertension, OSA not on CPAP, psoriasis who initially presented to clinic to see Dr. Bjorn Pippin for ER follow-up for concern for HF where he was noted to be febrile with hypotension prompting referral to ER. In the ER, TTE notable for LVEF 20% with global hypokinesis and septal dyssynergy, mild RV dysffunction, moderate PASP, mild MR, RAP 8. Cardiology is now consulted for management of systolic HF.  Assessment & Plan    #Acute Systolic HF: -Patient with symptoms of dyspnea that have been ongoing for the past few months with prior ER evaluations for suspected HF -Saw Dr. Bjorn Pippin in clinic for ER follow-up where he was febrile and hypotensive prompting referral to the ER -TTE on admission with newly diagnosed systolic HF with LVEF 20%, septal dyssynergy, low flow state but no distinct LV thrombus, mild RV dysfunction -Unclear etiology but has several risk factors for CAD -Will plan  for RHC/LHC tomorrow -Start spiro 12.5mg  daily -No BB due to severely reduced LVEF -Will add entresto as able pending renal function post-cath -No SGLT2i  due to UTI  #UTI: -Patient febrile and hypotensive in clinic found to have UTI on admission -Continue cipro per primary team -No SGLT2i due to history of recurrent UTI  #CKD IIIB: #Bladder cancer s/p left nephrectomy: -Likely has baseline CKD  #OSA: -Continue CPAP  #Thyroid Goiter: -Incidentally noted on imaging; planned for outpatient follow-up  INFORMED CONSENT: I have reviewed the risks, indications, and alternatives to cardiac catheterization, possible angioplasty, and stenting with the patient. Risks include but are not limited to bleeding, infection, vascular injury, stroke, myocardial infection, arrhythmia, kidney injury, radiation-related injury in the case of prolonged fluoroscopy use, emergency cardiac surgery, and death. The patient understands the risks of serious complication is 1-2 in 1000 with diagnostic cardiac cath and 1-2% or less with angioplasty/stenting.      For questions or updates, please contact [facility] Please consult [email or web address] for contact info under        Signed, Meriam Sprague, MD  10/08/2022, 10:24 AM

## 2022-10-08 NOTE — Progress Notes (Signed)
   Heart Failure Stewardship Pharmacist Progress Note   PCP: Raliegh Ip, DO PCP-Cardiologist: Nicki Guadalajara, MD   HPI:  77 YO male with a PMH of hypertension, hypothyroidism, history of bladder cancer status post left nephrectomy, cystoprostatectomy, ascending aortic aneurysm   Patient presented to ED 5/7 with complaints of shortness of breath and generalized weakness x3 weeks. Patient had a recent ED admission (09/27/22) that was suggestive of pulmonary edema on both CXR and CTPA--patient was discharged on Lasix 20 mg daily and told to follow-up with outpatient cardiology. Lasix was discontinued during the outpatient cardiology appointment on 10/06/22 due to hypotension and suspected infection and patient was transferred to [facility]. Echo from 5/8 showing LVEF <20%, global hypokinesis, septal dyssenergy with left bundle branch block by ECG, RV dysfunction and RVSP 44 mmHg, mild MVR. CXR from this admission is not concerning for pulmonary edema. Patient on lisinopril outpatient but this is currently held since his outpatient cardiology appointment due to hypotension--no other GDMT noted.   Current HF Medications: None  Prior to admission HF Medications: Diuretic: furosemide 20 mg daily (started 09/27/22) ACE/ARB/ARNI: lisinopril 5 mg daily  Pertinent Lab Values: Serum creatinine 1.35, BUN 25, Potassium 4.0, Sodium 134, Magnesium 2.1, A1c 5.1%   Vital Signs: Weight: 211 lbs (admission weight: 215 lbs) Blood pressure: 110s/70s  Heart rate: 80-90s  I/O: +447mL yesterday; net +285mL  Medication Assistance / Insurance Benefits Check: Does the patient have prescription insurance?  Yes Type of insurance plan: Medicare  Does the patient qualify for medication assistance through manufacturers or grants?   Yes Eligible grants and/or patient assistance programs: None currently  Medication assistance applications in progress: None  Medication assistance applications approved: None Approved  medication assistance renewals will be completed by: N/A  Outpatient Pharmacy:  Prior to admission outpatient pharmacy: [facility] Is the patient willing to use [facility] pharmacy at discharge? Pending Is the patient willing to transition their outpatient pharmacy to utilize a [facility] outpatient pharmacy?   Pending    Assessment: 1. Acute systolic CHF (LVEF <20%). NYHA class III symptoms. - Scr stable at 1.35 (BL 1.3-1.5). Strict I's and O's. Keep K >4 and Mg >2.  - Hold beta blocker given acute CHF exacerbation and RV dysfunction seen on echo - Hold ACEi/ARB/ARNi, diuretic in anticipation for cath procedure potentially tomorrow, 5/10  - Okay with starting spironolactone 12.5 mg daily and will monitor BP and Scr  - Hold SGLT2i given patient's current UTI  - Agree with slowly adding GDMT post-cath given elevated Scr at baseline and history of hypotension   Plan: 1) Medication changes recommended at this time: - Start spironolactone 12.5 mg daily  2) Patient assistance: - None currently   3)  Education  - To be completed prior to discharge   Cherylin Mylar, PharmD PGY1 Pharmacy Resident 5/9/20249:15 AM

## 2022-10-08 NOTE — H&P (View-Only) (Signed)
 Rounding Note    Patient Name: Billy Rasmussen Date of Encounter: 10/08/2022  [facility] Cardiologist: Thomas Kelly, MD   Subjective   Feeling okay today. No chest pain or SOB.  Inpatient Medications    Scheduled Meds:  enoxaparin (LOVENOX) injection  40 mg Subcutaneous Q24H   levothyroxine  25 mcg Oral Q0600   Continuous Infusions:  sodium chloride 10 mL/hr at 10/08/22 0859   ciprofloxacin 400 mg (10/08/22 0901)   PRN Meds: sodium chloride, acetaminophen **OR** acetaminophen, albuterol, ondansetron **OR** ondansetron (ZOFRAN) IV   Vital Signs    Vitals:   10/07/22 2031 10/08/22 0032 10/08/22 0321 10/08/22 0747  BP: 133/84 131/76 113/86 123/78  Pulse:  90 90 86  Resp: 18 17 18 18  Temp: 98.3 F (36.8 C) 98.6 F (37 C) 98.6 F (37 C) 97.7 F (36.5 C)  TempSrc: Oral Oral Oral Oral  SpO2: 98% 100% 98% 97%  Weight:      Height:        Intake/Output Summary (Last 24 hours) at 10/08/2022 1024 Last data filed at 10/08/2022 0858 Gross per 24 hour  Intake 880 ml  Output --  Net 880 ml      10/07/2022    8:00 PM 10/07/2022   12:56 AM 10/06/2022    6:38 PM  Last 3 Weights  Weight (lbs) 211 lb 214 lb 9.6 oz 215 lb 12.8 oz  Weight (kg) 95.709 kg 97.342 kg 97.886 kg      Telemetry    NSR with PVCs - Personally Reviewed  ECG    NSR, first degree AVB, IVCD - Personally Reviewed  Physical Exam   GEN: No acute distress.   Neck: No JVD Cardiac: RRR, no murmurs, rubs, or gallops.  Respiratory: Clear to auscultation bilaterally. GI: Soft, nontender, non-distended  MS: Trace pedal edema, warm Neuro:  Nonfocal  Psych: Normal affect   Labs    High Sensitivity Troponin:   Recent Labs  Lab 09/12/22 0958 09/27/22 0302 09/27/22 0550 10/06/22 1915 10/06/22 2102  TROPONINIHS 24* 27* 25* 31* 28*     Chemistry Recent Labs  Lab 10/06/22 1915 10/07/22 0413 10/07/22 1107 10/07/22 1617 10/08/22 0354  NA 127* 132* 132* 131* 134*  K 3.9 4.0 4.1 4.4  4.0  CL 94* 100 101 99 104  CO2 24 25 23 25 24  GLUCOSE 139* 95 124* 112* 98  BUN 31* 27* 26* 27* 25*  CREATININE 1.65* 1.47* 1.30* 1.37* 1.35*  CALCIUM 9.1 9.2 9.4 9.2 9.6  MG  --  2.1  --   --   --   PROT 7.4 6.9  --   --   --   ALBUMIN 3.9 3.5  --   --   --   AST 28 23  --   --   --   ALT 17 13  --   --   --   ALKPHOS 43 38  --   --   --   BILITOT 1.5* 1.2  --   --   --   GFRNONAA 43* 50* 58* 54* 55*  ANIONGAP 9 7 8 7 6    Lipids No results for input(s): "CHOL", "TRIG", "HDL", "LABVLDL", "LDLCALC", "CHOLHDL" in the last 168 hours.  Hematology Recent Labs  Lab 10/06/22 1915 10/07/22 0413  WBC 10.4 8.8  RBC 4.14* 4.16*  HGB 12.8* 12.7*  HCT 37.5* 38.7*  MCV 90.6 93.0  MCH 30.9 30.5  MCHC 34.1 32.8  RDW 14.5   14.3  PLT 109* 111*   Thyroid No results for input(s): "TSH", "FREET4" in the last 168 hours.  BNPNo results for input(s): "BNP", "PROBNP" in the last 168 hours.  DDimer  Recent Labs  Lab 10/06/22 1915  DDIMER 0.47     Radiology    ECHOCARDIOGRAM COMPLETE  Result Date: 10/07/2022    ECHOCARDIOGRAM REPORT   Patient Name:   Billy Rasmussen Date of Exam: 10/07/2022 Medical Rec #:  [id number]        Height:       66.0 in Accession #:    [id number]       Weight:       214.6 lb Date of Birth:  03/04/1946         BSA:          2.061 m Patient Age:    77 years         BP:           114/68 mmHg Patient Gender: M                HR:           93 bpm. Exam Location:  [facility] Procedure: 2D Echo, Cardiac Doppler, Color Doppler and Intracardiac            Opacification Agent Indications:    Congestive Heart Failure I50.9  History:        Patient has prior history of Echocardiogram examinations, most                 recent 04/14/2019. Signs/Symptoms:Hypotension and Shortness of                 Breath; Risk Factors:Hypertension, Former Smoker and Sleep                 Apnea.  Sonographer:    Norma Walker Referring Phys: [id number] CARLOS MADERA  Sonographer Comments: Image acquisition  challenging due to patient body habitus. IMPRESSIONS  1. Left ventricular ejection fraction, by estimation, is <20%. The left ventricle has severely decreased function. The left ventricle demonstrates global hypokinesis with septal dyssynergy. The left ventricular internal cavity size was mildly to moderately dilated. There is mild left ventricular hypertrophy. Left ventricular diastolic parameters are indeterminate.  2. Definity contrast shows loosely organized thrombotic material and slow flow in the apical inferolateral distribution, but no formed thrombus.  3. Right ventricular systolic function is mildly reduced. The right ventricular size is normal. There is moderately elevated pulmonary artery systolic pressure. The estimated right ventricular systolic pressure is 44.0 mmHg.  4. Left atrial size was severely dilated.  5. The mitral valve is degenerative. Mild mitral valve regurgitation.  6. The aortic valve was not well visualized. There is mild calcification of the aortic valve. Aortic valve regurgitation is not visualized.  7. Aortic dilatation noted. There is mild dilatation of the aortic root, measuring 41 mm. There is moderate dilatation of the ascending aorta, measuring 45 mm.  8. The inferior vena cava is dilated in size with >50% respiratory variability, suggesting right atrial pressure of 8 mmHg. Comparison(s): Prior images reviewed side by side. LVEF has decreased substantially compared with prior study, now <20%. FINDINGS  Left Ventricle: Left ventricular ejection fraction, by estimation, is <20%. The left ventricle has severely decreased function. The left ventricle demonstrates global hypokinesis. Definity contrast agent was given IV to delineate the left ventricular endocardial borders. The left ventricular internal cavity size was mildly to moderately dilated. There is   mild left ventricular hypertrophy. Abnormal (paradoxical) septal motion, consistent with left bundle branch block. Left  ventricular diastolic parameters are indeterminate. Right Ventricle: The right ventricular size is normal. No increase in right ventricular wall thickness. Right ventricular systolic function is mildly reduced. There is moderately elevated pulmonary artery systolic pressure. The tricuspid regurgitant velocity is 3.00 m/s, and with an assumed right atrial pressure of 8 mmHg, the estimated right ventricular systolic pressure is 44.0 mmHg. Left Atrium: Left atrial size was severely dilated. Right Atrium: Right atrial size was normal in size. Pericardium: There is no evidence of pericardial effusion. Mitral Valve: The mitral valve is degenerative in appearance. There is mild thickening of the mitral valve leaflet(s). There is mild calcification of the mitral valve leaflet(s). Mild mitral annular calcification. Mild mitral valve regurgitation. Tricuspid Valve: The tricuspid valve is grossly normal. Tricuspid valve regurgitation is mild. Aortic Valve: The aortic valve was not well visualized. There is mild calcification of the aortic valve. There is mild aortic valve annular calcification. Aortic valve regurgitation is not visualized. Pulmonic Valve: The pulmonic valve was not well visualized. Pulmonic valve regurgitation is not visualized. Aorta: Aortic dilatation noted. There is mild dilatation of the aortic root, measuring 41 mm. There is moderate dilatation of the ascending aorta, measuring 45 mm. Venous: The inferior vena cava is dilated in size with greater than 50% respiratory variability, suggesting right atrial pressure of 8 mmHg. IAS/Shunts: No atrial level shunt detected by color flow Doppler.  LEFT VENTRICLE PLAX 2D LVIDd:         6.10 cm   Diastology LVIDs:         5.70 cm   LV e' medial:    9.30 cm/s LV PW:         1.10 cm   LV E/e' medial:  18.8 LV IVS:        1.00 cm   LV e' lateral:   14.70 cm/s LVOT diam:     2.00 cm   LV E/e' lateral: 11.9 LV SV:         50 LV SV Index:   24 LVOT Area:     3.14 cm   RIGHT VENTRICLE RV S prime:     9.39 cm/s TAPSE (M-mode): 1.9 cm LEFT ATRIUM              Index        RIGHT ATRIUM           Index LA diam:        4.60 cm  2.23 cm/m   RA Area:     23.30 cm LA Vol (A2C):   113.0 ml 54.84 ml/m  RA Volume:   60.90 ml  29.55 ml/m LA Vol (A4C):   109.0 ml 52.90 ml/m LA Biplane Vol: 111.0 ml 53.87 ml/m  AORTIC VALVE LVOT Vmax:   96.00 cm/s LVOT Vmean:  59.200 cm/s LVOT VTI:    0.159 m  AORTA Ao Root diam: 4.00 cm Ao Asc diam:  4.50 cm MITRAL VALVE                TRICUSPID VALVE MV Area (PHT): 8.07 cm     TR Peak grad:   36.0 mmHg MV Decel Time: 94 msec      TR Vmax:        300.00 cm/s MV E velocity: 175.00 cm/s                               SHUNTS                             Systemic VTI:  0.16 m                             Systemic Diam: 2.00 cm Samuel Mcdowell MD Electronically signed by Samuel Mcdowell MD Signature Date/Time: 10/07/2022/2:57:01 PM    Final    DG Chest Port 1 View  Result Date: 10/06/2022 CLINICAL DATA:  Shortness of breath. EXAM: PORTABLE CHEST 1 VIEW COMPARISON:  Chest radiograph dated 09/27/2022 and CT dated 09/27/2022. FINDINGS: No focal consolidation, pleural effusion, or pneumothorax. Stable mild cardiomegaly. No acute osseous pathology. Bilateral shoulder hemi arthroplasties. IMPRESSION: No active disease. Electronically Signed   By: Arash  Radparvar M.D.   On: 10/06/2022 19:25    Cardiac Studies   TTE 10/07/22: IMPRESSIONS     1. Left ventricular ejection fraction, by estimation, is <20%. The left  ventricle has severely decreased function. The left ventricle demonstrates  global hypokinesis with septal dyssynergy. The left ventricular internal  cavity size was mildly to  moderately dilated. There is mild left ventricular hypertrophy. Left  ventricular diastolic parameters are indeterminate.   2. Definity contrast shows loosely organized thrombotic material and slow  flow in the apical inferolateral distribution, but no formed thrombus.    3. Right ventricular systolic function is mildly reduced. The right  ventricular size is normal. There is moderately elevated pulmonary artery  systolic pressure. The estimated right ventricular systolic pressure is  44.0 mmHg.   4. Left atrial size was severely dilated.   5. The mitral valve is degenerative. Mild mitral valve regurgitation.   6. The aortic valve was not well visualized. There is mild calcification  of the aortic valve. Aortic valve regurgitation is not visualized.   7. Aortic dilatation noted. There is mild dilatation of the aortic root,  measuring 41 mm. There is moderate dilatation of the ascending aorta,  measuring 45 mm.   8. The inferior vena cava is dilated in size with >50% respiratory  variability, suggesting right atrial pressure of 8 mmHg.   Comparison(s): Prior images reviewed side by side. LVEF has decreased  substantially compared with prior study, now <20%.   Patient Profile     76 y.o. male with history of  bladder cancer s/p left nephrectomy, cystoprostatectomy , fibromyalgia, hypertension, OSA not on CPAP, psoriasis who initially presented to clinic to see Dr. Schumann for ER follow-up for concern for HF where he was noted to be febrile with hypotension prompting referral to ER. In the ER, TTE notable for LVEF 20% with global hypokinesis and septal dyssynergy, mild RV dysffunction, moderate PASP, mild MR, RAP 8. Cardiology is now consulted for management of systolic HF.  Assessment & Plan    #Acute Systolic HF: -Patient with symptoms of dyspnea that have been ongoing for the past few months with prior ER evaluations for suspected HF -Saw Dr. Schumann in clinic for ER follow-up where he was febrile and hypotensive prompting referral to the ER -TTE on admission with newly diagnosed systolic HF with LVEF 20%, septal dyssynergy, low flow state but no distinct LV thrombus, mild RV dysfunction -Unclear etiology but has several risk factors for CAD -Will plan  for RHC/LHC tomorrow -Start spiro 12.5mg daily -No BB due to severely reduced LVEF -Will add entresto as able pending renal function post-cath -No SGLT2i   due to UTI  #UTI: -Patient febrile and hypotensive in clinic found to have UTI on admission -Continue cipro per primary team -No SGLT2i due to history of recurrent UTI  #CKD IIIB: #Bladder cancer s/p left nephrectomy: -Likely has baseline CKD  #OSA: -Continue CPAP  #Thyroid Goiter: -Incidentally noted on imaging; planned for outpatient follow-up  INFORMED CONSENT: I have reviewed the risks, indications, and alternatives to cardiac catheterization, possible angioplasty, and stenting with the patient. Risks include but are not limited to bleeding, infection, vascular injury, stroke, myocardial infection, arrhythmia, kidney injury, radiation-related injury in the case of prolonged fluoroscopy use, emergency cardiac surgery, and death. The patient understands the risks of serious complication is 1-2 in 1000 with diagnostic cardiac cath and 1-2% or less with angioplasty/stenting.      For questions or updates, please contact [facility] Please consult [email or web address] for contact info under        Signed, Ashby Moskal E Ahmaad Neidhardt, MD  10/08/2022, 10:24 AM    

## 2022-10-08 NOTE — Progress Notes (Signed)
   10/08/22 1445  Hand-off documentation  Hand-off Given Given to shift RN/LPN  Report given to (Full Name) Fonnie Jarvis. RN   Care turned over at this time.

## 2022-10-08 NOTE — Progress Notes (Addendum)
[facility] PROGRESS NOTE   Billy Rasmussen ZOX:[id number] DOB: 05-10-1946 DOA: 10/06/2022  PCP: Raliegh Ip, DO  Brief History/Interval Summary: 77 y.o. male with medical history significant of hypertension, hypothyroidism, history of bladder cancer status post left nephrectomy, cystoprostatectomy, ascending aortic aneurysm who presented to the emergency department due to 3-week onset of shortness of breath and generalized weakness.  He was also complaining of fever.  Patient has a urostomy.  UA suggesting infection.  He was hospitalized for further management.  Echocardiogram subsequently showed significantly lower left Triklo EF of 20%.  He was transferred over to [facility] for further cardiac workup.    Consultants: Cardiology  Procedures: Transthoracic echocardiogram    Subjective/Interval History: Patient complains of shortness of breath with exertion and occasionally even when he is lying flat on the bed.  Denies any chest pain.  No nausea or vomiting.    Assessment/Plan:  Acute systolic CHF Echocardiogram shows EF of 20% global hypokinesis noted.  Organized thrombotic material without any formed thrombus was also noted. Seen by cardiology.  Transfer to [facility] for further workup. Currently not on any cardiac medications.  Will defer to cardiology. Strict in and outs and daily weights.  Seems to be fairly stable at this time.  Urinary tract infection in the setting of urostomy/previous history of bladder cancer and nephrectomy Patient has lone kidney.  UA was abnormal.  Patient started on ciprofloxacin.  ESBL infection in 2021.  Follow-up on urine cultures.  Chronic kidney disease stage IIIa/long kidney Patient is status post nephrectomy in the past.  Renal function seems to be close to his baseline of 1.3- 1.5. Monitor renal function closely.  Monitor urine output.  Avoid nephrotoxic agents.  Essential hypertension All medications currently on  hold including his diuretics and lisinopril.  Hyponatremia Initially came in with sodium level of 127 which was thought to be due to hypovolemia.  He was hydrated with improvement in sodium levels.  Chronic thrombocytopenia Stable.  Hypothyroidism Continue levothyroxine.  Obesity Estimated body mass index is 34.06 kg/m as calculated from the following:   Height as of this encounter: 5\' 6"  (1.676 m).   Weight as of this encounter: 95.7 kg.   DVT Prophylaxis: Lovenox Code Status: Full code Family Communication: Discussed with the patient Disposition Plan: Hopefully home when improved  Status is: Observation The patient will require care spanning > 2 midnights and should be moved to inpatient because: CHF      Medications: Scheduled:  enoxaparin (LOVENOX) injection  40 mg Subcutaneous Q24H   levothyroxine  25 mcg Oral Q0600   Continuous:  sodium chloride 10 mL/hr at 10/08/22 0859   ciprofloxacin 400 mg (10/08/22 0901)   WJX:BJYNWG chloride, acetaminophen **OR** acetaminophen, albuterol, ondansetron **OR** ondansetron (ZOFRAN) IV  Antibiotics: Anti-infectives (From admission, onward)    Start     Dose/Rate Route Frequency Ordered Stop   10/07/22 0800  ciprofloxacin (CIPRO) IVPB 400 mg        400 mg 200 mL/hr over 60 Minutes Intravenous Every 12 hours 10/07/22 0211     10/06/22 2130  cefTRIAXone (ROCEPHIN) 1 g in sodium chloride 0.9 % 100 mL IVPB        1 g 200 mL/hr over 30 Minutes Intravenous  Once 10/06/22 2128 10/06/22 2237       Objective:  Vital Signs  Vitals:   10/07/22 2031 10/08/22 0032 10/08/22 0321 10/08/22 0747  BP: 133/84 131/76 113/86 123/78  Pulse:  90 90 86  Resp: 18 17 18 18   Temp: 98.3 F (36.8 C) 98.6 F (37 C) 98.6 F (37 C) 97.7 F (36.5 C)  TempSrc: Oral Oral Oral Oral  SpO2: 98% 100% 98% 97%  Weight:      Height:        Intake/Output Summary (Last 24 hours) at 10/08/2022 0957 Last data filed at 10/08/2022 0858 Gross per 24 hour   Intake 880 ml  Output --  Net 880 ml   Filed Weights   10/06/22 1838 10/07/22 0056 10/07/22 2000  Weight: 97.9 kg 97.3 kg 95.7 kg    General appearance: Awake alert.  In no distress Resp: Normal effort at rest.  Few crackles at the bases.  No wheezing or rhonchi. Cardio: S1-S2 is normal regular.  No S3-S4.  No rubs murmurs or bruit GI: Abdomen is soft.  Nontender nondistended.  Bowel sounds are present normal.  No masses organomegaly.  Urostomy is noted. Extremities: Mild bilateral pedal edema .  Full range of motion of lower extremities. Neurologic: Alert and oriented x3.  No focal neurological deficits.    Lab Results:  Data Reviewed: I have personally reviewed following labs and reports of the imaging studies  CBC: Recent Labs  Lab 10/06/22 1915 10/07/22 0413  WBC 10.4 8.8  NEUTROABS 8.5*  --   HGB 12.8* 12.7*  HCT 37.5* 38.7*  MCV 90.6 93.0  PLT 109* 111*    Basic Metabolic Panel: Recent Labs  Lab 10/06/22 1915 10/07/22 0413 10/07/22 1107 10/07/22 1617 10/08/22 0354  NA 127* 132* 132* 131* 134*  K 3.9 4.0 4.1 4.4 4.0  CL 94* 100 101 99 104  CO2 24 25 23 25 24   GLUCOSE 139* 95 124* 112* 98  BUN 31* 27* 26* 27* 25*  CREATININE 1.65* 1.47* 1.30* 1.37* 1.35*  CALCIUM 9.1 9.2 9.4 9.2 9.6  MG  --  2.1  --   --   --   PHOS  --  2.0*  --   --   --     GFR: Estimated Creatinine Clearance: 52 mL/min (A) (by C-G formula based on SCr of 1.35 mg/dL (H)).  Liver Function Tests: Recent Labs  Lab 10/06/22 1915 10/07/22 0413  AST 28 23  ALT 17 13  ALKPHOS 43 38  BILITOT 1.5* 1.2  PROT 7.4 6.9  ALBUMIN 3.9 3.5      Recent Results (from the past 240 hour(s))  SARS Coronavirus 2 by RT PCR (hospital order, performed in [facility] hospital lab) *cepheid single result test* Anterior Nasal Swab     Status: None   Collection Time: 10/06/22  6:43 PM   Specimen: Anterior Nasal Swab  Result Value Ref Range Status   SARS Coronavirus 2 by RT PCR NEGATIVE NEGATIVE  Final    Comment: (NOTE) SARS-CoV-2 target nucleic acids are NOT DETECTED.  The SARS-CoV-2 RNA is generally detectable in upper and lower respiratory specimens during the acute phase of infection. The lowest concentration of SARS-CoV-2 viral copies this assay can detect is 250 copies / mL. A negative result does not preclude SARS-CoV-2 infection and should not be used as the sole basis for treatment or other patient management decisions.  A negative result may occur with improper specimen collection / handling, submission of specimen other than nasopharyngeal swab, presence of viral mutation(s) within the areas targeted by this assay, and inadequate number of viral copies (<250 copies / mL). A negative result must be combined with clinical observations, patient history, and epidemiological  information.  Fact Sheet for Patients:   RoadLapTop.co.za  Fact Sheet for Healthcare Providers: http://kim-miller.com/  This test is not yet approved or  cleared by the [place] FDA and has been authorized for detection and/or diagnosis of SARS-CoV-2 by FDA under an Emergency Use Authorization (EUA).  This EUA will remain in effect (meaning this test can be used) for the duration of the COVID-19 declaration under Section 564(b)(1) of the Act, 21 U.S.C. section 360bbb-3(b)(1), unless the authorization is terminated or revoked sooner.  Performed at [facility], [address] [place], [place] [address]       Radiology Studies: ECHOCARDIOGRAM COMPLETE  Result Date: 10/07/2022    ECHOCARDIOGRAM REPORT   Patient Name:   Billy Rasmussen Date of Exam: 10/07/2022 Medical Rec #:  [id number]        Height:       66.0 in Accession #:    [id number]       Weight:       214.6 lb Date of Birth:  20-Dec-1945         BSA:          2.061 m Patient Age:    76 years         BP:           114/68 mmHg Patient Gender: M                HR:           93 bpm. Exam [facility] Procedure: 2D Echo, Cardiac Doppler, Color Doppler and Intracardiac            Opacification Agent Indications:    Congestive Heart Failure I50.9  History:        Patient has prior history of Echocardiogram examinations, most                 recent 04/14/2019. Signs/Symptoms:Hypotension and Shortness of                 Breath; Risk Factors:Hypertension, Former Smoker and Sleep                 Apnea.  Sonographer:    Aron Baba Referring Phys: [phone] CARLOS MADERA  Sonographer Comments: Image acquisition challenging due to patient body habitus. IMPRESSIONS  1. Left ventricular ejection fraction, by estimation, is <20%. The left ventricle has severely decreased function. The left ventricle demonstrates global hypokinesis with septal dyssynergy. The left ventricular internal cavity size was mildly to moderately dilated. There is mild left ventricular hypertrophy. Left ventricular diastolic parameters are indeterminate.  2. Definity contrast shows loosely organized thrombotic material and slow flow in the apical inferolateral distribution, but no formed thrombus.  3. Right ventricular systolic function is mildly reduced. The right ventricular size is normal. There is moderately elevated pulmonary artery systolic pressure. The estimated right ventricular systolic pressure is 44.0 mmHg.  4. Left atrial size was severely dilated.  5. The mitral valve is degenerative. Mild mitral valve regurgitation.  6. The aortic valve was not well visualized. There is mild calcification of the aortic valve. Aortic valve regurgitation is not visualized.  7. Aortic dilatation noted. There is mild dilatation of the aortic root, measuring 41 mm. There is moderate dilatation of the ascending aorta, measuring 45 mm.  8. The inferior vena cava is dilated in size with >50% respiratory variability, suggesting right atrial pressure of 8 mmHg. Comparison(s): Prior images reviewed side by side. LVEF has decreased substantially compared  with prior study, now <20%. FINDINGS  Left Ventricle: Left ventricular ejection fraction, by estimation, is <20%. The left ventricle has severely decreased function. The left ventricle demonstrates global hypokinesis. Definity contrast agent was given IV to delineate the left ventricular endocardial borders. The left ventricular internal cavity size was mildly to moderately dilated. There is mild left ventricular hypertrophy. Abnormal (paradoxical) septal motion, consistent with left bundle branch block. Left ventricular diastolic parameters are indeterminate. Right Ventricle: The right ventricular size is normal. No increase in right ventricular wall thickness. Right ventricular systolic function is mildly reduced. There is moderately elevated pulmonary artery systolic pressure. The tricuspid regurgitant velocity is 3.00 m/s, and with an assumed right atrial pressure of 8 mmHg, the estimated right ventricular systolic pressure is 44.0 mmHg. Left Atrium: Left atrial size was severely dilated. Right Atrium: Right atrial size was normal in size. Pericardium: There is no evidence of pericardial effusion. Mitral Valve: The mitral valve is degenerative in appearance. There is mild thickening of the mitral valve leaflet(s). There is mild calcification of the mitral valve leaflet(s). Mild mitral annular calcification. Mild mitral valve regurgitation. Tricuspid Valve: The tricuspid valve is grossly normal. Tricuspid valve regurgitation is mild. Aortic Valve: The aortic valve was not well visualized. There is mild calcification of the aortic valve. There is mild aortic valve annular calcification. Aortic valve regurgitation is not visualized. Pulmonic Valve: The pulmonic valve was not well visualized. Pulmonic valve regurgitation is not visualized. Aorta: Aortic dilatation noted. There is mild dilatation of the aortic root, measuring 41 mm. There is moderate dilatation of the ascending aorta, measuring 45 mm. Venous: The  inferior vena cava is dilated in size with greater than 50% respiratory variability, suggesting right atrial pressure of 8 mmHg. IAS/Shunts: No atrial level shunt detected by color flow Doppler.  LEFT VENTRICLE PLAX 2D LVIDd:         6.10 cm   Diastology LVIDs:         5.70 cm   LV e' medial:    9.30 cm/s LV PW:         1.10 cm   LV E/e' medial:  18.8 LV IVS:        1.00 cm   LV e' lateral:   14.70 cm/s LVOT diam:     2.00 cm   LV E/e' lateral: 11.9 LV SV:         50 LV SV Index:   24 LVOT Area:     3.14 cm  RIGHT VENTRICLE RV S prime:     9.39 cm/s TAPSE (M-mode): 1.9 cm LEFT ATRIUM              Index        RIGHT ATRIUM           Index LA diam:        4.60 cm  2.23 cm/m   RA Area:     23.30 cm LA Vol (A2C):   113.0 ml 54.84 ml/m  RA Volume:   60.90 ml  29.55 ml/m LA Vol (A4C):   109.0 ml 52.90 ml/m LA Biplane Vol: 111.0 ml 53.87 ml/m  AORTIC VALVE LVOT Vmax:   96.00 cm/s LVOT Vmean:  59.200 cm/s LVOT VTI:    0.159 m  AORTA Ao Root diam: 4.00 cm Ao Asc diam:  4.50 cm MITRAL VALVE                TRICUSPID VALVE MV Area (PHT): 8.07 cm  TR Peak grad:   36.0 mmHg MV Decel Time: 94 msec      TR Vmax:        300.00 cm/s MV E velocity: 175.00 cm/s                             SHUNTS                             Systemic VTI:  0.16 m                             Systemic Diam: 2.00 cm Nona Dell MD Electronically signed by Nona Dell MD Signature Date/Time: 10/07/2022/2:57:01 PM    Final    DG Chest Port 1 View  Result Date: 10/06/2022 CLINICAL DATA:  Shortness of breath. EXAM: PORTABLE CHEST 1 VIEW COMPARISON:  Chest radiograph dated 09/27/2022 and CT dated 09/27/2022. FINDINGS: No focal consolidation, pleural effusion, or pneumothorax. Stable mild cardiomegaly. No acute osseous pathology. Bilateral shoulder hemi arthroplasties. IMPRESSION: No active disease. Electronically Signed   By: Elgie Collard M.D.   On: 10/06/2022 19:25       LOS: 0 days   Teodor Prater Rito Ehrlich  [facility] Pager on  [email or web address]  10/08/2022, 9:57 AM

## 2022-10-09 ENCOUNTER — Encounter (HOSPITAL_COMMUNITY): Payer: Self-pay | Admitting: Internal Medicine

## 2022-10-09 ENCOUNTER — Encounter (HOSPITAL_COMMUNITY)
Admission: EM | Disposition: A | Discharge status: Home / Self Care | Payer: Self-pay | Source: Home / Self Care | Attending: Internal Medicine

## 2022-10-09 DIAGNOSIS — I10 Essential (primary) hypertension: Secondary | ICD-10-CM | POA: Diagnosis not present

## 2022-10-09 DIAGNOSIS — N390 Urinary tract infection, site not specified: Secondary | ICD-10-CM | POA: Diagnosis not present

## 2022-10-09 DIAGNOSIS — E861 Hypovolemia: Secondary | ICD-10-CM | POA: Diagnosis not present

## 2022-10-09 DIAGNOSIS — I5021 Acute systolic (congestive) heart failure: Secondary | ICD-10-CM | POA: Diagnosis not present

## 2022-10-09 DIAGNOSIS — R7989 Other specified abnormal findings of blood chemistry: Secondary | ICD-10-CM | POA: Diagnosis not present

## 2022-10-09 HISTORY — PX: RIGHT HEART CATH AND CORONARY ANGIOGRAPHY: CATH118264

## 2022-10-09 LAB — POCT I-STAT EG7
Acid-Base Excess: 0 mmol/L (ref 0.0–2.0)
Acid-Base Excess: 0 mmol/L (ref 0.0–2.0)
Bicarbonate: 25.3 mmol/L (ref 20.0–28.0)
Bicarbonate: 25.8 mmol/L (ref 20.0–28.0)
Calcium, Ion: 1.29 mmol/L (ref 1.15–1.40)
Calcium, Ion: 1.31 mmol/L (ref 1.15–1.40)
HCT: 40 % (ref 39.0–52.0)
HCT: 40 % (ref 39.0–52.0)
Hemoglobin: 13.6 g/dL (ref 13.0–17.0)
Hemoglobin: 13.6 g/dL (ref 13.0–17.0)
O2 Saturation: 75 %
O2 Saturation: 66 %
Potassium: 4.4 mmol/L (ref 3.5–5.1)
Potassium: 4.3 mmol/L (ref 3.5–5.1)
Sodium: 138 mmol/L (ref 135–145)
Sodium: 138 mmol/L (ref 135–145)
TCO2: 27 mmol/L (ref 22–32)
TCO2: 27 mmol/L (ref 22–32)
pCO2, Ven: 42.9 mmHg — ABNORMAL LOW (ref 44–60)
pCO2, Ven: 46.3 mmHg (ref 44–60)
pH, Ven: 7.38 (ref 7.25–7.43)
pH, Ven: 7.353 (ref 7.25–7.43)
pO2, Ven: 41 mmHg (ref 32–45)
pO2, Ven: 36 mmHg (ref 32–45)

## 2022-10-09 LAB — POCT I-STAT 7, (LYTES, BLD GAS, ICA,H+H)
Acid-base deficit: 2 mmol/L (ref 0.0–2.0)
Bicarbonate: 22.1 mmol/L (ref 20.0–28.0)
Calcium, Ion: 1.28 mmol/L (ref 1.15–1.40)
HCT: 38 % — ABNORMAL LOW (ref 39.0–52.0)
Hemoglobin: 12.9 g/dL — ABNORMAL LOW (ref 13.0–17.0)
O2 Saturation: 95 %
Potassium: 4.3 mmol/L (ref 3.5–5.1)
Sodium: 139 mmol/L (ref 135–145)
TCO2: 23 mmol/L (ref 22–32)
pCO2 arterial: 35 mmHg (ref 32–48)
pH, Arterial: 7.408 (ref 7.35–7.45)
pO2, Arterial: 72 mmHg — ABNORMAL LOW (ref 83–108)

## 2022-10-09 LAB — BASIC METABOLIC PANEL
Anion gap: 9 (ref 5–15)
BUN: 20 mg/dL (ref 8–23)
CO2: 24 mmol/L (ref 22–32)
Calcium: 9.6 mg/dL (ref 8.9–10.3)
Chloride: 100 mmol/L (ref 98–111)
Creatinine, Ser: 1.42 mg/dL — ABNORMAL HIGH (ref 0.61–1.24)
GFR, Estimated: 52 mL/min — ABNORMAL LOW (ref >60)
Glucose, Bld: 101 mg/dL — ABNORMAL HIGH (ref 70–99)
Potassium: 4.2 mmol/L (ref 3.5–5.1)
Sodium: 133 mmol/L — ABNORMAL LOW (ref 135–145)

## 2022-10-09 SURGERY — RIGHT HEART CATH AND CORONARY ANGIOGRAPHY
Anesthesia: LOCAL

## 2022-10-09 MED ORDER — HYDRALAZINE HCL 20 MG/ML IJ SOLN: 10.0000 mg | INTRAMUSCULAR | Status: AC | PRN

## 2022-10-09 MED ORDER — SODIUM CHLORIDE 0.9 % IV SOLN
INTRAVENOUS | Status: AC | PRN
  Administered 2022-10-09: 10 mL/h via INTRAVENOUS

## 2022-10-09 MED ORDER — ACETAMINOPHEN 325 MG PO TABS
650.0000 mg | ORAL_TABLET | ORAL | Status: DC | PRN
  Filled 2022-10-09: qty 2

## 2022-10-09 MED ORDER — MIDAZOLAM HCL 2 MG/2ML IJ SOLN
INTRAMUSCULAR | Status: DC | PRN
  Administered 2022-10-09 (×2): 1 mg via INTRAVENOUS

## 2022-10-09 MED ORDER — SODIUM CHLORIDE 0.9% FLUSH: 3.0000 mL | INTRAVENOUS | Status: DC | PRN

## 2022-10-09 MED ORDER — FENTANYL CITRATE (PF) 100 MCG/2ML IJ SOLN
INTRAMUSCULAR | Status: DC | PRN
  Administered 2022-10-09 (×2): 25 ug via INTRAVENOUS

## 2022-10-09 MED ORDER — ONDANSETRON HCL 4 MG/2ML IJ SOLN: 4.0000 mg | Freq: Four times a day (QID) | INTRAMUSCULAR | Status: DC | PRN

## 2022-10-09 MED ORDER — LIDOCAINE HCL (PF) 1 % IJ SOLN
INTRAMUSCULAR | Status: AC
  Filled 2022-10-09: qty 30

## 2022-10-09 MED ORDER — SODIUM CHLORIDE 0.9 % IV SOLN: 250.0000 mL | INTRAVENOUS | Status: DC | PRN

## 2022-10-09 MED ORDER — LIDOCAINE HCL (PF) 1 % IJ SOLN
INTRAMUSCULAR | Status: DC | PRN
  Administered 2022-10-09 (×2): 2 mL

## 2022-10-09 MED ORDER — SODIUM CHLORIDE 0.9% FLUSH
3.0000 mL | Freq: Two times a day (BID) | INTRAVENOUS | Status: DC
  Administered 2022-10-09 – 2022-10-13 (×7): 3 mL via INTRAVENOUS

## 2022-10-09 MED ORDER — VERAPAMIL HCL 2.5 MG/ML IV SOLN
INTRAVENOUS | Status: AC
  Filled 2022-10-09: qty 2

## 2022-10-09 MED ORDER — IOHEXOL 350 MG/ML SOLN
INTRAVENOUS | Status: DC | PRN
  Administered 2022-10-09: 50 mL

## 2022-10-09 MED ORDER — LABETALOL HCL 5 MG/ML IV SOLN: 10.0000 mg | INTRAVENOUS | Status: AC | PRN

## 2022-10-09 MED ORDER — MIDAZOLAM HCL 2 MG/2ML IJ SOLN
INTRAMUSCULAR | Status: AC
  Filled 2022-10-09: qty 2

## 2022-10-09 MED ORDER — HEPARIN SODIUM (PORCINE) 1000 UNIT/ML IJ SOLN
INTRAMUSCULAR | Status: DC | PRN
  Administered 2022-10-09: 5000 [IU] via INTRA_ARTERIAL

## 2022-10-09 MED ORDER — VERAPAMIL HCL 2.5 MG/ML IV SOLN
INTRAVENOUS | Status: DC | PRN
  Administered 2022-10-09: 10 mL via INTRA_ARTERIAL

## 2022-10-09 MED ORDER — FENTANYL CITRATE (PF) 100 MCG/2ML IJ SOLN
INTRAMUSCULAR | Status: AC
  Filled 2022-10-09: qty 2

## 2022-10-09 MED ORDER — HEPARIN SODIUM (PORCINE) 1000 UNIT/ML IJ SOLN
INTRAMUSCULAR | Status: AC
  Filled 2022-10-09: qty 10

## 2022-10-09 MED ORDER — HEPARIN (PORCINE) IN NACL 1000-0.9 UT/500ML-% IV SOLN
INTRAVENOUS | Status: DC | PRN
  Administered 2022-10-09 (×2): 500 mL

## 2022-10-09 SURGICAL SUPPLY — 14 items
CATH BALLN WEDGE 5F 110CM (CATHETERS) IMPLANT
CATH DIAG 6FR JL4 (CATHETERS) IMPLANT
CATH INFINITI 6F FL3.5 (CATHETERS) IMPLANT
CATH OPTITORQUE TIG 4.0 6F (CATHETERS) IMPLANT
DEVICE RAD COMP TR BAND LRG (VASCULAR PRODUCTS) IMPLANT
GLIDESHEATH SLEND SS 6F .021 (SHEATH) IMPLANT
KIT HEART LEFT (KITS) ×1 IMPLANT
PACK CARDIAC CATHETERIZATION (CUSTOM PROCEDURE TRAY) ×1 IMPLANT
SHEATH GLIDE SLENDER 4/5FR (SHEATH) IMPLANT
SHEATH PROBE COVER 6X72 (BAG) IMPLANT
TRANSDUCER W/STOPCOCK (MISCELLANEOUS) ×1 IMPLANT
TUBING CIL FLEX 10 FLL-RA (TUBING) ×1 IMPLANT
WIRE EMERALD 3MM-J .035X260CM (WIRE) IMPLANT
WIRE MICRO SET SILHO 5FR 7 (SHEATH) IMPLANT

## 2022-10-09 NOTE — Progress Notes (Signed)
Heart Failure Navigator Progress Note  Following this hospitalization to assess for HV TOC readiness.   EF <20% Cath scheduled for 3 pm Patient currently having a bedside procedure, will plan to interview later for HF TOC   Rhae Hammock, BSN, RN Heart Failure Print production planner Chat Only

## 2022-10-09 NOTE — Interval H&P Note (Signed)
History and Physical Interval Note:  10/09/2022 7:16 AM  Billy Rasmussen  has presented today for surgery, with the diagnosis of acute systolic heart failure.  The various methods of treatment have been discussed with the patient and family. After consideration of risks, benefits and other options for treatment, the patient has consented to  Procedure(s): RIGHT/LEFT HEART CATH AND CORONARY ANGIOGRAPHY (N/A) as a surgical intervention.  The patient's history has been reviewed, patient examined, no change in status, stable for surgery.  I have reviewed the patient's chart and labs.  Questions were answered to the patient's satisfaction.     [name]

## 2022-10-09 NOTE — Telephone Encounter (Signed)
2nd call to [facility] for pulse ox results. Message sent to team w/ company that handles this to send me the report.

## 2022-10-09 NOTE — Progress Notes (Signed)
[facility] PROGRESS NOTE   Billy Rasmussen ZOX:[id number] DOB: Jun 21, 1945 DOA: 10/06/2022  PCP: Raliegh Ip, DO  Brief History/Interval Summary: 77 y.o. male with medical history significant of hypertension, hypothyroidism, history of bladder cancer status post left nephrectomy, cystoprostatectomy, ascending aortic aneurysm who presented to the emergency department due to 3-week onset of shortness of breath and generalized weakness.  He was also complaining of fever.  Patient has a urostomy.  UA suggesting infection.  He was hospitalized for further management.  Echocardiogram subsequently showed significantly lower left Triklo EF of 20%.  He was transferred over to [facility] for further cardiac workup.    Consultants: Cardiology  Procedures: Transthoracic echocardiogram    Subjective/Interval History: Complains of occasional shortness of breath.  Denies any chest pain.  No nausea or vomiting.  Waiting on cardiac catheterization.      Assessment/Plan:  Acute systolic CHF Echocardiogram shows EF of 20% global hypokinesis noted.  Organized thrombotic material without any formed thrombus was also noted. Seen by cardiology.  Transferred to [facility] for further workup. Patient was started on spironolactone yesterday. Plan is for left and right heart catheterization today.  Seems to be stable at this time.  Urinary tract infection in the setting of urostomy/previous history of bladder cancer and nephrectomy Patient has lone kidney.  UA was abnormal.  Patient started on ciprofloxacin.  ESBL infection in 2021.  Follow-up on urine cultures. WBC noted to be normal.  He is afebrile.  Chronic kidney disease stage IIIa/long kidney Patient is status post left nephrectomy in the past.  Renal function seems to be close to his baseline of 1.3- 1.5. Monitor renal function closely.  Monitor urine output.  Avoid nephrotoxic agents.  Essential hypertension All medications  currently on hold including his diuretics and lisinopril.  Blood pressure noted to be borderline low this morning.  He is asymptomatic.  Hyponatremia Initially came in with sodium level of 127 which was thought to be due to hypovolemia.  He was hydrated with improvement in sodium levels.  Chronic thrombocytopenia Stable.  Hypothyroidism Continue levothyroxine.  Obesity Estimated body mass index is 34.06 kg/m as calculated from the following:   Height as of this encounter: 5\' 6"  (1.676 m).   Weight as of this encounter: 95.7 kg.   DVT Prophylaxis: Lovenox Code Status: Full code Family Communication: Discussed with the patient Disposition Plan: Hopefully home when improved       Medications: Scheduled:  enoxaparin (LOVENOX) injection  40 mg Subcutaneous Q24H   levothyroxine  25 mcg Oral Q0600   sodium chloride flush  3 mL Intravenous Q12H   spironolactone  12.5 mg Oral Daily   Continuous:  sodium chloride 10 mL/hr at 10/08/22 0859   sodium chloride     sodium chloride 10 mL/hr at 10/09/22 8119   ciprofloxacin 400 mg (10/09/22 0739)   JYN:WGNFAO chloride, sodium chloride, acetaminophen **OR** acetaminophen, albuterol, ondansetron **OR** ondansetron (ZOFRAN) IV, sodium chloride flush  Antibiotics: Anti-infectives (From admission, onward)    Start     Dose/Rate Route Frequency Ordered Stop   10/07/22 0800  ciprofloxacin (CIPRO) IVPB 400 mg        400 mg 200 mL/hr over 60 Minutes Intravenous Every 12 hours 10/07/22 0211     10/06/22 2130  cefTRIAXone (ROCEPHIN) 1 g in sodium chloride 0.9 % 100 mL IVPB        1 g 200 mL/hr over 30 Minutes Intravenous  Once 10/06/22 2128 10/06/22 2237  Objective:  Vital Signs  Vitals:   10/08/22 1703 10/08/22 2023 10/09/22 0540 10/09/22 0904  BP: 134/82 (!) 128/95 (!) 128/90 93/79  Pulse: 92 97 88 91  Resp: 17 16 19 19   Temp: 98.5 F (36.9 C) 99.8 F (37.7 C) 97.9 F (36.6 C) 98.5 F (36.9 C)  TempSrc: Oral Oral Oral  Oral  SpO2: 97% 97%    Weight:      Height:        Intake/Output Summary (Last 24 hours) at 10/09/2022 1011 Last data filed at 10/09/2022 0732 Gross per 24 hour  Intake 10.14 ml  Output 250 ml  Net -239.86 ml    Filed Weights   10/06/22 1838 10/07/22 0056 10/07/22 2000  Weight: 97.9 kg 97.3 kg 95.7 kg    General appearance: Awake alert.  In no distress Resp: Clear to auscultation bilaterally.  Normal effort Cardio: S1-S2 is normal regular.  No S3-S4.  No rubs murmurs or bruit GI: Abdomen is soft.  Nontender nondistended.  Bowel sounds are present normal.  No masses organomegaly Extremities: Mild edema.  Full range of motion of lower extremities. Neurologic: Alert and oriented x3.  No focal neurological deficits.     Lab Results:  Data Reviewed: I have personally reviewed following labs and reports of the imaging studies  CBC: Recent Labs  Lab 10/06/22 1915 10/07/22 0413  WBC 10.4 8.8  NEUTROABS 8.5*  --   HGB 12.8* 12.7*  HCT 37.5* 38.7*  MCV 90.6 93.0  PLT 109* 111*     Basic Metabolic Panel: Recent Labs  Lab 10/06/22 1915 10/07/22 0413 10/07/22 1107 10/07/22 1617 10/08/22 0354  NA 127* 132* 132* 131* 134*  K 3.9 4.0 4.1 4.4 4.0  CL 94* 100 101 99 104  CO2 24 25 23 25 24   GLUCOSE 139* 95 124* 112* 98  BUN 31* 27* 26* 27* 25*  CREATININE 1.65* 1.47* 1.30* 1.37* 1.35*  CALCIUM 9.1 9.2 9.4 9.2 9.6  MG  --  2.1  --   --   --   PHOS  --  2.0*  --   --   --      GFR: Estimated Creatinine Clearance: 52 mL/min (A) (by C-G formula based on SCr of 1.35 mg/dL (H)).  Liver Function Tests: Recent Labs  Lab 10/06/22 1915 10/07/22 0413  AST 28 23  ALT 17 13  ALKPHOS 43 38  BILITOT 1.5* 1.2  PROT 7.4 6.9  ALBUMIN 3.9 3.5       Recent Results (from the past 240 hour(s))  SARS Coronavirus 2 by RT PCR (hospital order, performed in [facility] hospital lab) *cepheid single result test* Anterior Nasal Swab     Status: None   Collection Time: 10/06/22   6:43 PM   Specimen: Anterior Nasal Swab  Result Value Ref Range Status   SARS Coronavirus 2 by RT PCR NEGATIVE NEGATIVE Final    Comment: (NOTE) SARS-CoV-2 target nucleic acids are NOT DETECTED.  The SARS-CoV-2 RNA is generally detectable in upper and lower respiratory specimens during the acute phase of infection. The lowest concentration of SARS-CoV-2 viral copies this assay can detect is 250 copies / mL. A negative result does not preclude SARS-CoV-2 infection and should not be used as the sole basis for treatment or other patient management decisions.  A negative result may occur with improper specimen collection / handling, submission of specimen other than nasopharyngeal swab, presence of viral mutation(s) within the areas targeted by this assay, and  inadequate number of viral copies (<250 copies / mL). A negative result must be combined with clinical observations, patient history, and epidemiological information.  Fact Sheet for Patients:   RoadLapTop.co.za  Fact Sheet for Healthcare Providers: http://kim-miller.com/  This test is not yet approved or  cleared by the [place] FDA and has been authorized for detection and/or diagnosis of SARS-CoV-2 by FDA under an Emergency Use Authorization (EUA).  This EUA will remain in effect (meaning this test can be used) for the duration of the COVID-19 declaration under Section 564(b)(1) of the Act, 21 U.S.C. section 360bbb-3(b)(1), unless the authorization is terminated or revoked sooner.  Performed at [facility], [address] [place], [place] [address]       Radiology Studies: ECHOCARDIOGRAM COMPLETE  Result Date: 10/07/2022    ECHOCARDIOGRAM REPORT   Patient Name:   Billy Rasmussen Date of Exam: 10/07/2022 Medical Rec #:  [id number]        Height:       66.0 in Accession #:    [id number]       Weight:       214.6 lb Date of Birth:  Feb 18, 1946         BSA:          2.061 m  Patient Age:    76 years         BP:           114/68 mmHg Patient Gender: M                HR:           93 bpm. [facility]am [facility] Procedure: 2D Echo, Cardiac Doppler, Color Doppler and Intracardiac            Opacification Agent Indications:    Congestive Heart Failure I50.9  History:        Patient has prior history of Echocardiogram examinations, most                 recent 04/14/2019. Signs/Symptoms:Hypotension and Shortness of                 Breath; Risk Factors:Hypertension, Former Smoker and Sleep                 Apnea.  Sonographer:    Aron Baba Referring Phys: [phone] CARLOS MADERA  Sonographer Comments: Image acquisition challenging due to patient body habitus. IMPRESSIONS  1. Left ventricular ejection fraction, by estimation, is <20%. The left ventricle has severely decreased function. The left ventricle demonstrates global hypokinesis with septal dyssynergy. The left ventricular internal cavity size was mildly to moderately dilated. There is mild left ventricular hypertrophy. Left ventricular diastolic parameters are indeterminate.  2. Definity contrast shows loosely organized thrombotic material and slow flow in the apical inferolateral distribution, but no formed thrombus.  3. Right ventricular systolic function is mildly reduced. The right ventricular size is normal. There is moderately elevated pulmonary artery systolic pressure. The estimated right ventricular systolic pressure is 44.0 mmHg.  4. Left atrial size was severely dilated.  5. The mitral valve is degenerative. Mild mitral valve regurgitation.  6. The aortic valve was not well visualized. There is mild calcification of the aortic valve. Aortic valve regurgitation is not visualized.  7. Aortic dilatation noted. There is mild dilatation of the aortic root, measuring 41 mm. There is moderate dilatation of the ascending aorta, measuring 45 mm.  8. The inferior vena cava is dilated in size with >  50% respiratory variability,  suggesting right atrial pressure of 8 mmHg. Comparison(s): Prior images reviewed side by side. LVEF has decreased substantially compared with prior study, now <20%. FINDINGS  Left Ventricle: Left ventricular ejection fraction, by estimation, is <20%. The left ventricle has severely decreased function. The left ventricle demonstrates global hypokinesis. Definity contrast agent was given IV to delineate the left ventricular endocardial borders. The left ventricular internal cavity size was mildly to moderately dilated. There is mild left ventricular hypertrophy. Abnormal (paradoxical) septal motion, consistent with left bundle branch block. Left ventricular diastolic parameters are indeterminate. Right Ventricle: The right ventricular size is normal. No increase in right ventricular wall thickness. Right ventricular systolic function is mildly reduced. There is moderately elevated pulmonary artery systolic pressure. The tricuspid regurgitant velocity is 3.00 m/s, and with an assumed right atrial pressure of 8 mmHg, the estimated right ventricular systolic pressure is 44.0 mmHg. Left Atrium: Left atrial size was severely dilated. Right Atrium: Right atrial size was normal in size. Pericardium: There is no evidence of pericardial effusion. Mitral Valve: The mitral valve is degenerative in appearance. There is mild thickening of the mitral valve leaflet(s). There is mild calcification of the mitral valve leaflet(s). Mild mitral annular calcification. Mild mitral valve regurgitation. Tricuspid Valve: The tricuspid valve is grossly normal. Tricuspid valve regurgitation is mild. Aortic Valve: The aortic valve was not well visualized. There is mild calcification of the aortic valve. There is mild aortic valve annular calcification. Aortic valve regurgitation is not visualized. Pulmonic Valve: The pulmonic valve was not well visualized. Pulmonic valve regurgitation is not visualized. Aorta: Aortic dilatation noted. There is  mild dilatation of the aortic root, measuring 41 mm. There is moderate dilatation of the ascending aorta, measuring 45 mm. Venous: The inferior vena cava is dilated in size with greater than 50% respiratory variability, suggesting right atrial pressure of 8 mmHg. IAS/Shunts: No atrial level shunt detected by color flow Doppler.  LEFT VENTRICLE PLAX 2D LVIDd:         6.10 cm   Diastology LVIDs:         5.70 cm   LV e' medial:    9.30 cm/s LV PW:         1.10 cm   LV E/e' medial:  18.8 LV IVS:        1.00 cm   LV e' lateral:   14.70 cm/s LVOT diam:     2.00 cm   LV E/e' lateral: 11.9 LV SV:         50 LV SV Index:   24 LVOT Area:     3.14 cm  RIGHT VENTRICLE RV S prime:     9.39 cm/s TAPSE (M-mode): 1.9 cm LEFT ATRIUM              Index        RIGHT ATRIUM           Index LA diam:        4.60 cm  2.23 cm/m   RA Area:     23.30 cm LA Vol (A2C):   113.0 ml 54.84 ml/m  RA Volume:   60.90 ml  29.55 ml/m LA Vol (A4C):   109.0 ml 52.90 ml/m LA Biplane Vol: 111.0 ml 53.87 ml/m  AORTIC VALVE LVOT Vmax:   96.00 cm/s LVOT Vmean:  59.200 cm/s LVOT VTI:    0.159 m  AORTA Ao Root diam: 4.00 cm Ao Asc diam:  4.50 cm MITRAL VALVE  TRICUSPID VALVE MV Area (PHT): 8.07 cm     TR Peak grad:   36.0 mmHg MV Decel Time: 94 msec      TR Vmax:        300.00 cm/s MV E velocity: 175.00 cm/s                             SHUNTS                             Systemic VTI:  0.16 m                             Systemic Diam: 2.00 cm Nona Dell MD Electronically signed by Nona Dell MD Signature Date/Time: 10/07/2022/2:57:01 PM    Final        LOS: 1 day   Osvaldo Shipper  [facility] Pager on [email or web address]  10/09/2022, 10:11 AM

## 2022-10-09 NOTE — Progress Notes (Signed)
   10/09/22 1850  First Vascular Site Assessment  #1 - Location of Site Assessment Right radial  #1 - Vascular Site Assessment Scale Level 0  #1 - Air in Band  (removed)     TR band removed per order/protocol. No complications.

## 2022-10-09 NOTE — Progress Notes (Addendum)
Rounding Note    Patient Name: Billy Rasmussen Date of Encounter: 10/09/2022  [facility] Cardiologist: Nicki Guadalajara, MD   Subjective   No complaints this morning, sitting in a chair. He continue to report SOB with rest. He states he rest supine  Inpatient Medications    Scheduled Meds:  enoxaparin (LOVENOX) injection  40 mg Subcutaneous Q24H   levothyroxine  25 mcg Oral Q0600   sodium chloride flush  3 mL Intravenous Q12H   spironolactone  12.5 mg Oral Daily   Continuous Infusions:  sodium chloride 10 mL/hr at 10/08/22 0859   sodium chloride     sodium chloride 10 mL/hr at 10/09/22 1610   ciprofloxacin 400 mg (10/09/22 0739)   PRN Meds: sodium chloride, sodium chloride, acetaminophen **OR** acetaminophen, albuterol, ondansetron **OR** ondansetron (ZOFRAN) IV, sodium chloride flush   Vital Signs    Vitals:   10/08/22 1703 10/08/22 2023 10/09/22 0540 10/09/22 0904  BP: 134/82 (!) 128/95 (!) 128/90 93/79  Pulse: 92 97 88 91  Resp: 17 16 19 19   Temp: 98.5 F (36.9 C) 99.8 F (37.7 C) 97.9 F (36.6 C) 98.5 F (36.9 C)  TempSrc: Oral Oral Oral Oral  SpO2: 97% 97%    Weight:      Height:        Intake/Output Summary (Last 24 hours) at 10/09/2022 0915 Last data filed at 10/09/2022 0732 Gross per 24 hour  Intake 10.14 ml  Output 250 ml  Net -239.86 ml      10/07/2022    8:00 PM 10/07/2022   12:56 AM 10/06/2022    6:38 PM  Last 3 Weights  Weight (lbs) 211 lb 214 lb 9.6 oz 215 lb 12.8 oz  Weight (kg) 95.709 kg 97.342 kg 97.886 kg      Telemetry    Sinus in the 80s, first degree heart block, PVCs - Personally Reviewed  ECG    No new tracings, repeat EKG pending - Personally Reviewed  Physical Exam   GEN: No acute distress.   Neck: + JVD Cardiac: RRR, no murmurs, rubs, or gallops.  Respiratory: crackles in bases GI: Soft, nontender, non-distended  MS: mild B LE edema; No deformity. Neuro:  Nonfocal  Psych: Normal affect   Labs    High  Sensitivity Troponin:   Recent Labs  Lab 09/12/22 0958 09/27/22 0302 09/27/22 0550 10/06/22 1915 10/06/22 2102  TROPONINIHS 24* 27* 25* 31* 28*     Chemistry Recent Labs  Lab 10/06/22 1915 10/07/22 0413 10/07/22 1107 10/07/22 1617 10/08/22 0354  NA 127* 132* 132* 131* 134*  K 3.9 4.0 4.1 4.4 4.0  CL 94* 100 101 99 104  CO2 24 25 23 25 24   GLUCOSE 139* 95 124* 112* 98  BUN 31* 27* 26* 27* 25*  CREATININE 1.65* 1.47* 1.30* 1.37* 1.35*  CALCIUM 9.1 9.2 9.4 9.2 9.6  MG  --  2.1  --   --   --   PROT 7.4 6.9  --   --   --   ALBUMIN 3.9 3.5  --   --   --   AST 28 23  --   --   --   ALT 17 13  --   --   --   ALKPHOS 43 38  --   --   --   BILITOT 1.5* 1.2  --   --   --   GFRNONAA 43* 50* 58* 54* 55*  ANIONGAP 9 7 8  7  6    Lipids No results for input(s): "CHOL", "TRIG", "HDL", "LABVLDL", "LDLCALC", "CHOLHDL" in the last 168 hours.  Hematology Recent Labs  Lab 10/06/22 1915 10/07/22 0413  WBC 10.4 8.8  RBC 4.14* 4.16*  HGB 12.8* 12.7*  HCT 37.5* 38.7*  MCV 90.6 93.0  MCH 30.9 30.5  MCHC 34.1 32.8  RDW 14.5 14.3  PLT 109* 111*   Thyroid No results for input(s): "TSH", "FREET4" in the last 168 hours.  BNPNo results for input(s): "BNP", "PROBNP" in the last 168 hours.  DDimer  Recent Labs  Lab 10/06/22 1915  DDIMER 0.47     Radiology    ECHOCARDIOGRAM COMPLETE  Result Date: 10/07/2022    ECHOCARDIOGRAM REPORT   Patient Name:   Billy Rasmussen Date of Exam: 10/07/2022 Medical Rec #:  [id number]        Height:       66.0 in Accession #:    [id number]       Weight:       214.6 lb Date of Birth:  Dec 29, 1945         BSA:          2.061 m Patient Age:    77 years         BP:           114/68 mmHg Patient Gender: M                HR:           93 bpm. [facility] Procedure: 2D Echo, Cardiac Doppler, Color Doppler and Intracardiac            Opacification Agent Indications:    Congestive Heart Failure I50.9  History:        Patient has prior history of  Echocardiogram examinations, most                 recent 04/14/2019. Signs/Symptoms:Hypotension and Shortness of                 Breath; Risk Factors:Hypertension, Former Smoker and Sleep                 Apnea.  Sonographer:    Aron Baba Referring Phys: [phone] CARLOS MADERA  Sonographer Comments: Image acquisition challenging due to patient body habitus. IMPRESSIONS  1. Left ventricular ejection fraction, by estimation, is <20%. The left ventricle has severely decreased function. The left ventricle demonstrates global hypokinesis with septal dyssynergy. The left ventricular internal cavity size was mildly to moderately dilated. There is mild left ventricular hypertrophy. Left ventricular diastolic parameters are indeterminate.  2. Definity contrast shows loosely organized thrombotic material and slow flow in the apical inferolateral distribution, but no formed thrombus.  3. Right ventricular systolic function is mildly reduced. The right ventricular size is normal. There is moderately elevated pulmonary artery systolic pressure. The estimated right ventricular systolic pressure is 44.0 mmHg.  4. Left atrial size was severely dilated.  5. The mitral valve is degenerative. Mild mitral valve regurgitation.  6. The aortic valve was not well visualized. There is mild calcification of the aortic valve. Aortic valve regurgitation is not visualized.  7. Aortic dilatation noted. There is mild dilatation of the aortic root, measuring 41 mm. There is moderate dilatation of the ascending aorta, measuring 45 mm.  8. The inferior vena cava is dilated in size with >50% respiratory variability, suggesting right atrial pressure of 8 mmHg. Comparison(s): Prior images reviewed side by side.  LVEF has decreased substantially compared with prior study, now <20%. FINDINGS  Left Ventricle: Left ventricular ejection fraction, by estimation, is <20%. The left ventricle has severely decreased function. The left ventricle demonstrates global  hypokinesis. Definity contrast agent was given IV to delineate the left ventricular endocardial borders. The left ventricular internal cavity size was mildly to moderately dilated. There is mild left ventricular hypertrophy. Abnormal (paradoxical) septal motion, consistent with left bundle branch block. Left ventricular diastolic parameters are indeterminate. Right Ventricle: The right ventricular size is normal. No increase in right ventricular wall thickness. Right ventricular systolic function is mildly reduced. There is moderately elevated pulmonary artery systolic pressure. The tricuspid regurgitant velocity is 3.00 m/s, and with an assumed right atrial pressure of 8 mmHg, the estimated right ventricular systolic pressure is 44.0 mmHg. Left Atrium: Left atrial size was severely dilated. Right Atrium: Right atrial size was normal in size. Pericardium: There is no evidence of pericardial effusion. Mitral Valve: The mitral valve is degenerative in appearance. There is mild thickening of the mitral valve leaflet(s). There is mild calcification of the mitral valve leaflet(s). Mild mitral annular calcification. Mild mitral valve regurgitation. Tricuspid Valve: The tricuspid valve is grossly normal. Tricuspid valve regurgitation is mild. Aortic Valve: The aortic valve was not well visualized. There is mild calcification of the aortic valve. There is mild aortic valve annular calcification. Aortic valve regurgitation is not visualized. Pulmonic Valve: The pulmonic valve was not well visualized. Pulmonic valve regurgitation is not visualized. Aorta: Aortic dilatation noted. There is mild dilatation of the aortic root, measuring 41 mm. There is moderate dilatation of the ascending aorta, measuring 45 mm. Venous: The inferior vena cava is dilated in size with greater than 50% respiratory variability, suggesting right atrial pressure of 8 mmHg. IAS/Shunts: No atrial level shunt detected by color flow Doppler.  LEFT  VENTRICLE PLAX 2D LVIDd:         6.10 cm   Diastology LVIDs:         5.70 cm   LV e' medial:    9.30 cm/s LV PW:         1.10 cm   LV E/e' medial:  18.8 LV IVS:        1.00 cm   LV e' lateral:   14.70 cm/s LVOT diam:     2.00 cm   LV E/e' lateral: 11.9 LV SV:         50 LV SV Index:   24 LVOT Area:     3.14 cm  RIGHT VENTRICLE RV S prime:     9.39 cm/s TAPSE (M-mode): 1.9 cm LEFT ATRIUM              Index        RIGHT ATRIUM           Index LA diam:        4.60 cm  2.23 cm/m   RA Area:     23.30 cm LA Vol (A2C):   113.0 ml 54.84 ml/m  RA Volume:   60.90 ml  29.55 ml/m LA Vol (A4C):   109.0 ml 52.90 ml/m LA Biplane Vol: 111.0 ml 53.87 ml/m  AORTIC VALVE LVOT Vmax:   96.00 cm/s LVOT Vmean:  59.200 cm/s LVOT VTI:    0.159 m  AORTA Ao Root diam: 4.00 cm Ao Asc diam:  4.50 cm MITRAL VALVE                TRICUSPID VALVE MV Area (PHT):  8.07 cm     TR Peak grad:   36.0 mmHg MV Decel Time: 94 msec      TR Vmax:        300.00 cm/s MV E velocity: 175.00 cm/s                             SHUNTS                             Systemic VTI:  0.16 m                             Systemic Diam: 2.00 cm Nona Dell MD Electronically signed by Nona Dell MD Signature Date/Time: 10/07/2022/2:57:01 PM    Final     Cardiac Studies   Right and left heart cath - scheduled for today   Echo 10/07/22:  1. Left ventricular ejection fraction, by estimation, is <20%. The left  ventricle has severely decreased function. The left ventricle demonstrates  global hypokinesis with septal dyssynergy. The left ventricular internal  cavity size was mildly to  moderately dilated. There is mild left ventricular hypertrophy. Left  ventricular diastolic parameters are indeterminate.   2. Definity contrast shows loosely organized thrombotic material and slow  flow in the apical inferolateral distribution, but no formed thrombus.   3. Right ventricular systolic function is mildly reduced. The right  ventricular size is normal. There  is moderately elevated pulmonary artery  systolic pressure. The estimated right ventricular systolic pressure is  44.0 mmHg.   4. Left atrial size was severely dilated.   5. The mitral valve is degenerative. Mild mitral valve regurgitation.   6. The aortic valve was not well visualized. There is mild calcification  of the aortic valve. Aortic valve regurgitation is not visualized.   7. Aortic dilatation noted. There is mild dilatation of the aortic root,  measuring 41 mm. There is moderate dilatation of the ascending aorta,  measuring 45 mm.   8. The inferior vena cava is dilated in size with >50% respiratory  variability, suggesting right atrial pressure of 8 mmHg.    Patient Profile     77 y.o. male with history of  bladder cancer s/p left nephrectomy, cystoprostatectomy , fibromyalgia, hypertension, OSA not on CPAP, and ascending aortic aneurysm of 4.5 cm, and psoriasis who initially presented to clinic to see Dr. Bjorn Pippin for ER follow-up for concern for HF where he was noted to be febrile with hypotension prompting referral to ER. In the ER, TTE notable for LVEF 20% with global hypokinesis and septal dyssynergy, mild RV dysffunction, moderate PASP, mild MR, RAP 8. Cardiology is now consulted for management of systolic HF.   Assessment & Plan    Acute systolic heart failure - presented with dyspnea for a few months - ER visit 09/12/22 for chest pain notable for low and flat troponin - ER visit 09/27/22 for SOB, DOE - diuresed in the ER and discharged with PO lasix and referral to cardiology - she was seen by Dr. Bjorn Pippin 10/06/22 and was dypsneic and febrile, he recommended ER evaluation - echo with LVEF 20% with septal dyssynergy  - GDMT currently: 12.5 mg spironolactone started yesterday - will consider entresto but will need to follow labs closely given solitary kidney - no SGKT2i given UTI - BP 93/79 - spiro held this morning - needs  BB when able - pressure is marginal - will await  until after cath, if pressure is better - planning for right and left heart catheterization today   Febrile illness - he had a fever of 101.8 on the night prior to presentation with SOB - treating for UTI, per primary   Ascending aortic aneurysm 45 mm on CTA Recheck in 1 year Control BP   Hypertension Managed in the context of CHF, as above   Solitary kidney CKD III Baseline renal function appears to be near 1.3    For questions or updates, please contact [facility] Please consult [email or web address] for contact info under        Signed, Marcelino Duster, PA  10/09/2022, 9:15 AM    Patient seen and examined and agree with Micah Flesher, PA as detailed above.  In brief, the patient is a 77 y.o. male with history of  bladder cancer s/p left nephrectomy, cystoprostatectomy , fibromyalgia, hypertension, OSA not on CPAP, ascending aortic aneurysm of 4.5 cm, and psoriasis who initially presented to clinic to see Dr. Bjorn Pippin for ER follow-up for concern for HF where he was noted to be febrile with hypotension prompting referral to ER. In the ER, TTE notable for LVEF 20% with global hypokinesis and septal dyssynergy, mild RV dysffunction, moderate PASP, mild MR, RAP 8. Cardiology is now consulted for management of systolic HF.   Currently patient is comfortable with no chest pain. Has mild SOB with laying down and movement but states he can lay flat.Planned for [facility] today. Will continue to optimize GDMT as able, but has been limited due to hypotension. Cannot tolerate SGLT2i due to  frequent UTIs.  Holding BB due to severely depressed LVEF and RV dysfunction. May add dig tomorrow.  GEN: No acute distress.   Neck: JVD mid-neck Cardiac: RRR, no murmurs, rubs, or gallops.  Respiratory: CTAB GI: Soft, nontender, non-distended  MS: No edema; No deformity. Neuro:  Nonfocal  Psych: Normal affect    Plan: -Plan for RHC/LHC today -Will continued to optimize GDMT as able but  this has been limited due to hypotension -No SGLT2i due to recurrent UTI; no BB due to severely reduced LVEF and RV dysfunction -Management of UTI per primary  INFORMED CONSENT: I have reviewed the risks, indications, and alternatives to cardiac catheterization, possible angioplasty, and stenting with the patient. Risks include but are not limited to bleeding, infection, vascular injury, stroke, myocardial infection, arrhythmia, kidney injury, radiation-related injury in the case of prolonged fluoroscopy use, emergency cardiac surgery, and death. The patient understands the risks of serious complication is 1-2 in 1000 with diagnostic cardiac cath and 1-2% or less with angioplasty/stenting.    Laurance Flatten, MD

## 2022-10-09 NOTE — Progress Notes (Signed)
   Heart Failure Stewardship Pharmacist Progress Note   PCP: Raliegh Ip, DO PCP-Cardiologist: Nicki Guadalajara, MD   HPI:  77 YO male with a PMH of hypertension, hypothyroidism, history of bladder cancer status post left nephrectomy, cystoprostatectomy, ascending aortic aneurysm   Patient presented to ED 5/7 with complaints of shortness of breath and generalized weakness x3 weeks. Patient had a recent ED admission (09/27/22) that was suggestive of pulmonary edema on both CXR and CTPA--patient was discharged on Lasix 20 mg daily and told to follow-up with outpatient cardiology. Lasix was discontinued during the outpatient cardiology appointment on 10/06/22 due to hypotension and suspected infection and patient was transferred to [facility]. Echo from 5/8 showing LVEF <20%, global hypokinesis, septal dyssenergy with left bundle branch block by ECG, RV dysfunction and RVSP 44 mmHg, mild MVR. CXR from this admission is not concerning for pulmonary edema. R/LHC is scheduled for today, 5/10. Patient on lisinopril outpatient but this is currently held since his outpatient cardiology appointment due to hypotension--no other GDMT noted.   Current HF Medications: None  Prior to admission HF Medications: Diuretic: furosemide 20 mg daily (started 09/27/22) ACE/ARB/ARNI: lisinopril 5 mg daily  Pertinent Lab Values: Serum creatinine 1.42, BUN 20, Potassium 4.2, Sodium 133, Magnesium 2.1, A1c 5.1%   Vital Signs: Weight: 211 lbs (admission weight: 215 lbs) Blood pressure: 120s/80s  Heart rate: 80-90s  I/O: +256mL yesterday; net +289mL  Medication Assistance / Insurance Benefits Check: Does the patient have prescription insurance?  Yes Type of insurance plan: Medicare  Does the patient qualify for medication assistance through manufacturers or grants?   Yes Eligible grants and/or patient assistance programs: None currently  Medication assistance applications in progress: None  Medication assistance  applications approved: None Approved medication assistance renewals will be completed by: N/A  Outpatient Pharmacy:  Prior to admission outpatient [facility] Is the patient willing to use [facility] TOC pharmacy at discharge? Yes Is the patient willing to transition their outpatient pharmacy to utilize a [facility] outpatient pharmacy?   Pending   Assessment: 1. Acute systolic CHF (LVEF <20%). NYHA class III symptoms. - Scr stable at 1.35>>1.42 from yesterday while on spironolactone 12.5 mg. Strict I's and O's. Keep K >4 and Mg >2.  - Hold beta blocker given acute CHF exacerbation and RV dysfunction seen on echo - Hold ACEi/ARB/ARNi, diuretic in anticipation for cath procedure today and due to low BP  - Recommend holding spironolactone today given soft BP this AM - Hold SGLT2i given patient's current UTI  - Agree with slowly adding GDMT post-cath given elevated Scr at baseline and history of hypotension   Plan: 1) Medication changes recommended at this time: - Hold spironolactone 12.5 mg today due to low BP  2) Patient assistance: - None currently   3)  Education  - To be completed prior to discharge   Cherylin Mylar, PharmD PGY1 Pharmacy Resident 5/10/20248:41 AM

## 2022-10-09 NOTE — Progress Notes (Signed)
   10/09/22 0957  Hygiene  Oral Care Teeth brushed  Hygiene Bath;Peri care;Shaved  Level of Assistance Minimal assist  CHG  Bath Completed Yes  Skin Care Foam skin cleanser;Incontinent cleanser  Linen Change Full linen changed;Gown changed     Pre-procedure prep complete.

## 2022-10-10 DIAGNOSIS — I5020 Unspecified systolic (congestive) heart failure: Secondary | ICD-10-CM

## 2022-10-10 DIAGNOSIS — I5021 Acute systolic (congestive) heart failure: Secondary | ICD-10-CM | POA: Diagnosis not present

## 2022-10-10 DIAGNOSIS — N390 Urinary tract infection, site not specified: Secondary | ICD-10-CM | POA: Diagnosis not present

## 2022-10-10 LAB — BASIC METABOLIC PANEL
Anion gap: 10 (ref 5–15)
BUN: 21 mg/dL (ref 8–23)
CO2: 24 mmol/L (ref 22–32)
Calcium: 9.8 mg/dL (ref 8.9–10.3)
Chloride: 100 mmol/L (ref 98–111)
Creatinine, Ser: 1.43 mg/dL — ABNORMAL HIGH (ref 0.61–1.24)
GFR, Estimated: 51 mL/min — ABNORMAL LOW (ref >60)
Glucose, Bld: 100 mg/dL — ABNORMAL HIGH (ref 70–99)
Potassium: 4.2 mmol/L (ref 3.5–5.1)
Sodium: 134 mmol/L — ABNORMAL LOW (ref 135–145)

## 2022-10-10 LAB — CBC
HCT: 41 % (ref 39.0–52.0)
Hemoglobin: 13.7 g/dL (ref 13.0–17.0)
MCH: 29.7 pg (ref 26.0–34.0)
MCHC: 33.4 g/dL (ref 30.0–36.0)
MCV: 88.9 fL (ref 80.0–100.0)
Platelets: 154 10*3/uL (ref 150–400)
RBC: 4.61 MIL/uL (ref 4.22–5.81)
RDW: 13.8 % (ref 11.5–15.5)
WBC: 6.4 10*3/uL (ref 4.0–10.5)
nRBC: 0 % (ref 0.0–0.2)

## 2022-10-10 MED ORDER — FUROSEMIDE 10 MG/ML IJ SOLN
60.0000 mg | Freq: Once | INTRAMUSCULAR | Status: AC
  Administered 2022-10-10: 60 mg via INTRAVENOUS
  Filled 2022-10-10: qty 6

## 2022-10-10 MED ORDER — GUAIFENESIN 100 MG/5ML PO LIQD
5.0000 mL | ORAL | Status: DC | PRN
  Administered 2022-10-10 – 2022-10-12 (×4): 5 mL via ORAL
  Filled 2022-10-10 (×4): qty 10

## 2022-10-10 MED ORDER — CIPROFLOXACIN HCL 500 MG PO TABS
500.0000 mg | ORAL_TABLET | Freq: Two times a day (BID) | ORAL | Status: AC
  Administered 2022-10-10 – 2022-10-11 (×4): 500 mg via ORAL
  Filled 2022-10-10 (×4): qty 1

## 2022-10-10 NOTE — Plan of Care (Signed)

## 2022-10-10 NOTE — Progress Notes (Signed)
Rounding Note    Patient Name: Billy Rasmussen Date of Encounter: 10/10/2022  [facility] Cardiologist: Nicki Guadalajara, MD   Subjective   Billy Rasmussen is a little better   No CP   Inpatient Medications    Scheduled Meds:  enoxaparin (LOVENOX) injection  40 mg Subcutaneous Q24H   levothyroxine  25 mcg Oral Q0600   sodium chloride flush  3 mL Intravenous Q12H   spironolactone  12.5 mg Oral Daily   Continuous Infusions:  sodium chloride 10 mL/hr at 10/08/22 0859   sodium chloride     ciprofloxacin 400 mg (10/09/22 2045)   PRN Meds: sodium chloride, sodium chloride, acetaminophen **OR** acetaminophen, acetaminophen, albuterol, ondansetron **OR** ondansetron (ZOFRAN) IV, sodium chloride flush   Vital Signs    Vitals:   10/09/22 1745 10/09/22 2144 10/10/22 0023 10/10/22 0357  BP:  102/82 131/88 131/87  Pulse: 87 90 88 81  Resp:    20  Temp:  98.4 F (36.9 C) 98.6 F (37 C) 98.9 F (37.2 C)  TempSrc:  Oral Oral Oral  SpO2: 98% 99% 99% 99%  Weight:      Height:        Intake/Output Summary (Last 24 hours) at 10/10/2022 0724 Last data filed at 10/09/2022 2035 Gross per 24 hour  Intake --  Output 1750 ml  Net -1750 ml   Net neg 2.4 L      10/07/2022    8:00 PM 10/07/2022   12:56 AM 10/06/2022    6:38 PM  Last 3 Weights  Weight (lbs) 211 lb 214 lb 9.6 oz 215 lb 12.8 oz  Weight (kg) 95.709 kg 97.342 kg 97.886 kg      Telemetry   SR   - Personally Reviewed    Physical Exam   GEN: No acute distress.   Neck:  JVP is increased   Cardiac: RRR, No murmur  .  Respiratory: Occasional crackle R base   GI: Soft, nontender, non-distended  MS:  Trivial LE edema;    Labs    High Sensitivity Troponin:   Recent Labs  Lab 09/12/22 0958 09/27/22 0302 09/27/22 0550 10/06/22 1915 10/06/22 2102  TROPONINIHS 24* 27* 25* 31* 28*     Chemistry Recent Labs  Lab 10/06/22 1915 10/07/22 0413 10/07/22 1107 10/08/22 0354 10/09/22 1112 10/09/22 1623  10/09/22 1625 10/09/22 1628 10/10/22 0214  NA 127* 132*   < > 134* 133*   < > 138 138 134*  K 3.9 4.0   < > 4.0 4.2   < > 4.4 4.3 4.2  CL 94* 100   < > 104 100  --   --   --  100  CO2 24 25   < > 24 24  --   --   --  24  GLUCOSE 139* 95   < > 98 101*  --   --   --  100*  BUN 31* 27*   < > 25* 20  --   --   --  21  CREATININE 1.65* 1.47*   < > 1.35* 1.42*  --   --   --  1.43*  CALCIUM 9.1 9.2   < > 9.6 9.6  --   --   --  9.8  MG  --  2.1  --   --   --   --   --   --   --   PROT 7.4 6.9  --   --   --   --   --   --   --  ALBUMIN 3.9 3.5  --   --   --   --   --   --   --   AST 28 23  --   --   --   --   --   --   --   ALT 17 13  --   --   --   --   --   --   --   ALKPHOS 43 38  --   --   --   --   --   --   --   BILITOT 1.5* 1.2  --   --   --   --   --   --   --   GFRNONAA 43* 50*   < > 55* 52*  --   --   --  51*  ANIONGAP 9 7   < > 6 9  --   --   --  10   < > = values in this interval not displayed.    Lipids No results for input(s): "CHOL", "TRIG", "HDL", "LABVLDL", "LDLCALC", "CHOLHDL" in the last 168 hours.  Hematology Recent Labs  Lab 10/06/22 1915 10/07/22 0413 10/09/22 1623 10/09/22 1625 10/09/22 1628 10/10/22 0214  WBC 10.4 8.8  --   --   --  6.4  RBC 4.14* 4.16*  --   --   --  4.61  HGB 12.8* 12.7*   < > 13.6 13.6 13.7  HCT 37.5* 38.7*   < > 40.0 40.0 41.0  MCV 90.6 93.0  --   --   --  88.9  MCH 30.9 30.5  --   --   --  29.7  MCHC 34.1 32.8  --   --   --  33.4  RDW 14.5 14.3  --   --   --  13.8  PLT 109* 111*  --   --   --  154   < > = values in this interval not displayed.   Thyroid No results for input(s): "TSH", "FREET4" in the last 168 hours.  BNPNo results for input(s): "BNP", "PROBNP" in the last 168 hours.  DDimer  Recent Labs  Lab 10/06/22 1915  DDIMER 0.47     Radiology    CARDIAC CATHETERIZATION  Result Date: 10/09/2022   Prox RCA lesion is 30% stenosed. 1.  Mild diffuse coronary artery disease. 2.  Fick cardiac output of 7.89 L/min and Fick  cardiac index of 3.85 L/min/m with the following hemodynamics:  Right atrial pressure mean of 6 mmHg  Right ventricular pressure 50/3 with a end-diastolic pressure of 6 mmHg  Wedge pressure mean of 27 mmHg  Pulmonary artery pressure 55/26 with a mean of 37 mmHg  Pulmonary vascular resistance of 1.84 Woods units consistent with pulmonary venous hypertension  Pulmonary artery pulsatility index of 4.1 3.  LVEDP assessment was deferred due to the presence of left ventricular thrombus. Recommendation: Medical therapy.    Cardiac Studies   Right and left heart cath - scheduled for today    Prox RCA lesion is 30% stenosed.   1.  Mild diffuse coronary artery disease. 2.  Fick cardiac output of 7.89 L/min and Fick cardiac index of 3.85 L/min/m with the following hemodynamics:            Right atrial pressure mean of 6 mmHg            Right ventricular pressure 50/3 with a end-diastolic pressure of 6 mmHg  Wedge pressure mean of 27 mmHg            Pulmonary artery pressure 55/26 with a mean of 37 mmHg            Pulmonary vascular resistance of 1.84 Woods units consistent with pulmonary venous hypertension            Pulmonary artery pulsatility index of 4.1 3.  LVEDP assessment was deferred due to the presence of left ventricular thrombus.   Recommendation: Medical therapy.  Echo 10/07/22:  1. Left ventricular ejection fraction, by estimation, is <20%. The left  ventricle has severely decreased function. The left ventricle demonstrates  global hypokinesis with septal dyssynergy. The left ventricular internal  cavity size was mildly to  moderately dilated. There is mild left ventricular hypertrophy. Left  ventricular diastolic parameters are indeterminate.   2. Definity contrast shows loosely organized thrombotic material and slow  flow in the apical inferolateral distribution, but no formed thrombus.   3. Right ventricular systolic function is mildly reduced. The right  ventricular  size is normal. There is moderately elevated pulmonary artery  systolic pressure. The estimated right ventricular systolic pressure is  44.0 mmHg.   4. Left atrial size was severely dilated.   5. The mitral valve is degenerative. Mild mitral valve regurgitation.   6. The aortic valve was not well visualized. There is mild calcification  of the aortic valve. Aortic valve regurgitation is not visualized.   7. Aortic dilatation noted. There is mild dilatation of the aortic root,  measuring 41 mm. There is moderate dilatation of the ascending aorta,  measuring 45 mm.   8. The inferior vena cava is dilated in size with >50% respiratory  variability, suggesting right atrial pressure of 8 mmHg.    Patient Profile     77 y.o. male with history of  bladder cancer s/p left nephrectomy, cystoprostatectomy , fibromyalgia, hypertension, OSA not on CPAP, and ascending aortic aneurysm of 4.5 cm, and psoriasis who initially presented to clinic to see Dr. Bjorn Pippin for ER follow-up for concern for HF where he was noted to be febrile with hypotension prompting referral to ER. In the ER, TTE notable for LVEF 20% with global hypokinesis and septal dyssynergy, mild RV dysffunction, moderate PASP, mild MR, RAP 8. Cardiology is now consulted for management of systolic HF.   Assessment & Plan    Acute systolic heart failure - she was seen by Dr. Bjorn Pippin 10/06/22 and was dypsneic and febrile, he recommended ER evaluation - echo with LVEF 20% with septal dyssynergy  R and L heart cath yesterday as noted above   PCWP is high at 27   Pulmonary venous HTN   Note CI 3.85    Will give 1 dose of IV lasix today   Follow response   Follow closely UO and Cr He had  been started on low dose spiro 12.5 mg    I would hold   May confuse things  if there is a change in renal function.  Was not given yesterday     Hold other agents for now    Follow BP  has been labile   - no SGKT2i given UTI  CAD  Minimal CAD at cath   30% RCA     CT scan on 09/27/22 shows multiple coronary calcifications    Check lipids   WIll need statin   Febrile illness - he had a fever of 101.8 on the night prior to presentation with SOB -  treating for UTI, per primary Currently afebrile   Ascending aortic aneurysm 45 mm on CTA Recheck in 1 year Control BP   Hypertension BP hsa been labile  Follow      Solitary kidney CKD III Baseline renal function appears to be near 1.3 Today it is 1.43      For questions or updates, please contact [facility] Please consult [email or web address] for contact info under        Signed, Dietrich Pates, MD  10/10/2022, 7:24 AM    Patient seen and examined and agree with Micah Flesher, PA as detailed above.  In brief, the patient is a 77 y.o. male with history of  bladder cancer s/p left nephrectomy, cystoprostatectomy , fibromyalgia, hypertension, OSA not on CPAP, ascending aortic aneurysm of 4.5 cm, and psoriasis who initially presented to clinic to see Dr. Bjorn Pippin for ER follow-up for concern for HF where he was noted to be febrile with hypotension prompting referral to ER. In the ER, TTE notable for LVEF 20% with global hypokinesis and septal dyssynergy, mild RV dysffunction, moderate PASP, mild MR, RAP 8. Cardiology is now consulted for management of systolic HF.   Currently patient is comfortable with no chest pain. Has mild SOB with laying down and movement but states he can lay flat.Planned for [facility] today. Will continue to optimize GDMT as able, but has been limited due to hypotension. Cannot tolerate SGLT2i due to  frequent UTIs.  Holding BB due to severely depressed LVEF and RV dysfunction. May add dig tomorrow.  GEN: No acute distress.   Neck: JVD mid-neck Cardiac: RRR, no murmurs, rubs, or gallops.  Respiratory: CTAB GI: Soft, nontender, non-distended  MS: No edema; No deformity. Neuro:  Nonfocal  Psych: Normal affect    Plan: -Plan for RHC/LHC today -Will continued to  optimize GDMT as able but this has been limited due to hypotension -No SGLT2i due to recurrent UTI; no BB due to severely reduced LVEF and RV dysfunction -Management of UTI per primary  INFORMED CONSENT: I have reviewed the risks, indications, and alternatives to cardiac catheterization, possible angioplasty, and stenting with the patient. Risks include but are not limited to bleeding, infection, vascular injury, stroke, myocardial infection, arrhythmia, kidney injury, radiation-related injury in the case of prolonged fluoroscopy use, emergency cardiac surgery, and death. The patient understands the risks of serious complication is 1-2 in 1000 with diagnostic cardiac cath and 1-2% or less with angioplasty/stenting.    Laurance Flatten, MD

## 2022-10-10 NOTE — Progress Notes (Signed)
Heart Failure Nurse Navigator Progress Note  PCP: Raliegh Ip, DO PCP-Cardiologist: Tresa Endo Admission Diagnosis: Urinary track infection without hematuria Admitted from: Home  Presentation:   Billy Rasmussen presented with shortness of breath x 3 weeks, low BP at home fever evening before of 101.8 with chills and sweats. Plan for a R/L heart cath 5/10.   Patient and his wife were educated on the sign and symptoms of heart failure,, daily weights, when to call his doctor or go to the ED, Diet/ fluid restrictions, taking all medications as prescribed and attending all medical appointments. Patient verbalized his understanding of education a HF TOC appointment was scheduled for 10/29/2022 @ 10 am.   ECHO/ LVEF: <20%  Clinical Course:  Past Medical History:  Diagnosis Date   Arthritis    knees, shoulders, elbows   Bladder cancer [facility])    urologist-  dr eskridge   Diverticulosis of colon    Fibromyalgia    GERD (gastroesophageal reflux disease)    History of diverticulitis of colon    History of gastric ulcer    due to aleve   Hypertension    Lower urinary tract symptoms (LUTS)    OSA (obstructive sleep apnea)    NON-COMPLIANT  CPAP  --- BUT PT USES OXYGEN AT NIGHT 2.5L VIA Bruno (PT'S DECISION)   PONV (postoperative nausea and vomiting)    Psoriasis    Sepsis (HCC)    january 2021   Tinnitus    right ear more, has tranmitter in right ear removable at hs   Wears glasses    Wears partial dentures      Social History   Socioeconomic History   Marital status: Married    Spouse name: Peggy   Number of children: 2   Years of education: 14   Highest education level: Tax adviser degree: occupational, Scientist, product/process development, or vocational program  Occupational History   Occupation: retired    Comment: Pharmacologist, 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   Tobacco Use   Smoking status: Former    Packs/day: 2.00    Years: 40.00    Additional pack years: 0.00    Total pack years: 80.00    Types: Cigarettes     Quit date: 06/01/1997    Years since quitting: 25.3   Smokeless tobacco: Never  Vaping Use   Vaping Use: Never used  Substance and Sexual Activity   Alcohol use: No   Drug use: No   Sexual activity: Not Currently  Other Topics Concern   Not on file  Social History Narrative   One level home with wife    [id number]/4055 - 69 year old MIL lives with them   Social Determinants of Health   Financial Resource Strain: Low Risk  (10/09/2022)   Overall Financial Resource Strain (CARDIA)    Difficulty of Paying Living Expenses: Not very hard  Food Insecurity: No Food Insecurity (10/07/2022)   Hunger Vital Sign    Worried About Running Out of Food in the Last Year: Never true    Ran Out of Food in the Last Year: Never true  Transportation Needs: No Transportation Needs (10/07/2022)   PRAPARE - Administrator, Civil Service (Medical): No    Lack of Transportation (Non-Medical): No  Physical Activity: Inactive (08/14/2022)   Exercise Vital Sign    Days of Exercise per Week: 0 days    Minutes of Exercise per Session: 0 min  Stress: No Stress Concern Present (08/14/2022)   [employer] of Occupational Health -  Occupational Stress Questionnaire    Feeling of Stress : Not at all  Social Connections: Moderately Integrated (08/14/2022)   Social Connection and Isolation Panel [NHANES]    Frequency of Communication with Friends and Family: More than three times a week    Frequency of Social Gatherings with Friends and Family: More than three times a week    Attends Religious Services: More than 4 times per year    Active Member of [employer] or Organizations: No    Attends Engineer, structural: Never    Marital Status: Married   Water engineer and Provision:  Detailed education and instructions provided on heart failure disease management including the following:  Signs and symptoms of Heart Failure When to call the physician Importance of daily weights Low sodium diet Fluid  restriction Medication management Anticipated future follow-up appointments  Patient education given on each of the above topics.  Patient acknowledges understanding via teach back method and acceptance of all instructions.  Education Materials:  "Living Better With Heart Failure" Booklet, HF zone tool, & Daily Weight Tracker Tool.  Patient has scale at home: yes Patient has pill box at home: yes    High Risk Criteria for Readmission and/or Poor Patient Outcomes: Heart failure hospital admissions (last 6 months): 1  No Show rate: 1 % Difficult social situation: No Demonstrates medication adherence: Yes Primary Language: English Literacy level: Reading, writing, and comprehension  Barriers of Care:   Diet/ fluid restrictions Daily weights  Considerations/Referrals:   Referral made to Heart Failure Pharmacist Stewardship: Yes Referral made to Heart Failure CSW/NCM TOC: No Referral made to Heart & Vascular TOC clinic: Yes, 10/29/2022 @ 10 am  Items for Follow-up on DC/TOC: Diet/ fluids restrictions Daily weights Continued HF education    Rhae Hammock, BSN, RN Heart Failure Print production planner Chat Only

## 2022-10-10 NOTE — Progress Notes (Addendum)
[facility] PROGRESS NOTE   Billy Rasmussen ZOX:[id number] DOB: 1946/03/03 DOA: 10/06/2022  PCP: Raliegh Ip, DO  Brief History/Interval Summary: 77 y.o. male with medical history significant of hypertension, hypothyroidism, history of bladder cancer status post left nephrectomy, cystoprostatectomy, ascending aortic aneurysm who presented to the emergency department due to 3-week onset of shortness of breath and generalized weakness.  He was also complaining of fever.  Patient has a urostomy.  UA suggesting infection.  He was hospitalized for further management.  Echocardiogram subsequently showed significantly lower left Triklo EF of 20%.  He was transferred over to [facility] for further cardiac workup.    Consultants: Cardiology  Procedures:  Transthoracic echocardiogram  Right and left heart catheterization 1.  Mild diffuse coronary artery disease. 2.  Fick cardiac output of 7.89 L/min and Fick cardiac index of 3.85 L/min/m with the following hemodynamics:            Right atrial pressure mean of 6 mmHg            Right ventricular pressure 50/3 with a end-diastolic pressure of 6 mmHg            Wedge pressure mean of 27 mmHg            Pulmonary artery pressure 55/26 with a mean of 37 mmHg            Pulmonary vascular resistance of 1.84 Woods units consistent with pulmonary venous hypertension            Pulmonary artery pulsatility index of 4.1 3.  LVEDP assessment was deferred due to the presence of left ventricular thrombus.   Subjective/Interval History: Continues to have occasional shortness of breath.  No chest pain.  No nausea or vomiting.   Assessment/Plan:  Acute systolic CHF Echocardiogram shows EF of 20% global hypokinesis noted.  Organized thrombotic material without any formed thrombus was also noted. Seen by cardiology.  Transferred to [facility] for further workup. Patient was started on spironolactone. Patient underwent cardiac  catheterization on 5/10.  Mild CAD was noted. Further management per cardiology.   Urinary tract infection in the setting of urostomy/previous history of bladder cancer and nephrectomy Patient has lone kidney.  UA was abnormal.  Patient started on ciprofloxacin.  ESBL infection in 2021.   It does not appear that urine culture was ever sent.  Will change Cipro to oral.  Plan for a 7-day course.   WBC noted to be normal.  He is afebrile.  Chronic kidney disease stage IIIa/long kidney Patient is status post left nephrectomy in the past.  Renal function seems to be close to his baseline of 1.3- 1.5. Monitor renal function closely.  Monitor urine output.  Avoid nephrotoxic agents.  Essential hypertension All medications currently on hold including his diuretics and lisinopril.  Blood pressure is reasonably well-controlled.  Hyponatremia Initially came in with sodium level of 127 which was thought to be due to hypovolemia.  He was hydrated with improvement in sodium levels.  Chronic thrombocytopenia Stable.  Hypothyroidism Continue levothyroxine.  Obesity Estimated body mass index is 34.06 kg/m as calculated from the following:   Height as of this encounter: 5\' 6"  (1.676 m).   Weight as of this encounter: 95.7 kg.   DVT Prophylaxis: Lovenox Code Status: Full code Family Communication: Discussed with the patient Disposition Plan: Hopefully home when improved    Medications: Scheduled:  enoxaparin (LOVENOX) injection  40 mg Subcutaneous Q24H   levothyroxine  25 mcg Oral Q0600   sodium chloride flush  3 mL Intravenous Q12H   spironolactone  12.5 mg Oral Daily   Continuous:  sodium chloride 10 mL/hr at 10/08/22 0859   sodium chloride     ciprofloxacin 400 mg (10/09/22 2045)   ZOX:WRUEAV chloride, sodium chloride, acetaminophen **OR** acetaminophen, acetaminophen, albuterol, ondansetron **OR** ondansetron (ZOFRAN) IV, sodium chloride flush  Antibiotics: Anti-infectives (From  admission, onward)    Start     Dose/Rate Route Frequency Ordered Stop   10/07/22 0800  ciprofloxacin (CIPRO) IVPB 400 mg        400 mg 200 mL/hr over 60 Minutes Intravenous Every 12 hours 10/07/22 0211     10/06/22 2130  cefTRIAXone (ROCEPHIN) 1 g in sodium chloride 0.9 % 100 mL IVPB        1 g 200 mL/hr over 30 Minutes Intravenous  Once 10/06/22 2128 10/06/22 2237       Objective:  Vital Signs  Vitals:   10/09/22 1745 10/09/22 2144 10/10/22 0023 10/10/22 0357  BP:  102/82 131/88 131/87  Pulse: 87 90 88 81  Resp:    20  Temp:  98.4 F (36.9 C) 98.6 F (37 C) 98.9 F (37.2 C)  TempSrc:  Oral Oral Oral  SpO2: 98% 99% 99% 99%  Weight:      Height:        Intake/Output Summary (Last 24 hours) at 10/10/2022 0928 Last data filed at 10/10/2022 0919 Gross per 24 hour  Intake --  Output 2400 ml  Net -2400 ml    Filed Weights   10/06/22 1838 10/07/22 0056 10/07/22 2000  Weight: 97.9 kg 97.3 kg 95.7 kg   General appearance: Awake alert.  In no distress Resp: Clear to auscultation bilaterally.  Normal effort Cardio: S1-S2 is normal regular.  No S3-S4.  No rubs murmurs or bruit GI: Abdomen is soft.  Nontender nondistended.  Bowel sounds are present normal.  No masses organomegaly Extremities: Mild edema bilateral lower extremities. Alert and oriented x 3.  No focal neurological deficits.   Lab Results:  Data Reviewed: I have personally reviewed following labs and reports of the imaging studies  CBC: Recent Labs  Lab 10/06/22 1915 10/07/22 0413 10/09/22 1623 10/09/22 1625 10/09/22 1628 10/10/22 0214  WBC 10.4 8.8  --   --   --  6.4  NEUTROABS 8.5*  --   --   --   --   --   HGB 12.8* 12.7* 12.9* 13.6 13.6 13.7  HCT 37.5* 38.7* 38.0* 40.0 40.0 41.0  MCV 90.6 93.0  --   --   --  88.9  PLT 109* 111*  --   --   --  154     Basic Metabolic Panel: Recent Labs  Lab 10/07/22 0413 10/07/22 1107 10/07/22 1617 10/08/22 0354 10/09/22 1112 10/09/22 1623  10/09/22 1625 10/09/22 1628 10/10/22 0214  NA 132* 132* 131* 134* 133* 139 138 138 134*  K 4.0 4.1 4.4 4.0 4.2 4.3 4.4 4.3 4.2  CL 100 101 99 104 100  --   --   --  100  CO2 25 23 25 24 24   --   --   --  24  GLUCOSE 95 124* 112* 98 101*  --   --   --  100*  BUN 27* 26* 27* 25* 20  --   --   --  21  CREATININE 1.47* 1.30* 1.37* 1.35* 1.42*  --   --   --  1.43*  CALCIUM 9.2 9.4 9.2 9.6 9.6  --   --   --  9.8  MG 2.1  --   --   --   --   --   --   --   --   PHOS 2.0*  --   --   --   --   --   --   --   --      GFR: Estimated Creatinine Clearance: 49.1 mL/min (A) (by C-G formula based on SCr of 1.43 mg/dL (H)).  Liver Function Tests: Recent Labs  Lab 10/06/22 1915 10/07/22 0413  AST 28 23  ALT 17 13  ALKPHOS 43 38  BILITOT 1.5* 1.2  PROT 7.4 6.9  ALBUMIN 3.9 3.5       Recent Results (from the past 240 hour(s))  SARS Coronavirus 2 by RT PCR (hospital order, performed in [facility] hospital lab) *cepheid single result test* Anterior Nasal Swab     Status: None   Collection Time: 10/06/22  6:43 PM   Specimen: Anterior Nasal Swab  Result Value Ref Range Status   SARS Coronavirus 2 by RT PCR NEGATIVE NEGATIVE Final    Comment: (NOTE) SARS-CoV-2 target nucleic acids are NOT DETECTED.  The SARS-CoV-2 RNA is generally detectable in upper and lower respiratory specimens during the acute phase of infection. The lowest concentration of SARS-CoV-2 viral copies this assay can detect is 250 copies / mL. A negative result does not preclude SARS-CoV-2 infection and should not be used as the sole basis for treatment or other patient management decisions.  A negative result may occur with improper specimen collection / handling, submission of specimen other than nasopharyngeal swab, presence of viral mutation(s) within the areas targeted by this assay, and inadequate number of viral copies (<250 copies / mL). A negative result must be combined with clinical observations, patient  history, and epidemiological information.  Fact Sheet for Patients:   RoadLapTop.co.za  Fact Sheet for Healthcare Providers: http://kim-miller.com/  This test is not yet approved or  cleared by the [place] FDA and has been authorized for detection and/or diagnosis of SARS-CoV-2 by FDA under an Emergency Use Authorization (EUA).  This EUA will remain in effect (meaning this test can be used) for the duration of the COVID-19 declaration under Section 564(b)(1) of the Act, 21 U.S.C. section 360bbb-3(b)(1), unless the authorization is terminated or revoked sooner.  Performed at [facility], [address], [place], [place] [address]       Radiology Studies: CARDIAC CATHETERIZATION  Result Date: 10/09/2022   Prox RCA lesion is 30% stenosed. 1.  Mild diffuse coronary artery disease. 2.  Fick cardiac output of 7.89 L/min and Fick cardiac index of 3.85 L/min/m with the following hemodynamics:  Right atrial pressure mean of 6 mmHg  Right ventricular pressure 50/3 with a end-diastolic pressure of 6 mmHg  Wedge pressure mean of 27 mmHg  Pulmonary artery pressure 55/26 with a mean of 37 mmHg  Pulmonary vascular resistance of 1.84 Woods units consistent with pulmonary venous hypertension  Pulmonary artery pulsatility index of 4.1 3.  LVEDP assessment was deferred due to the presence of left ventricular thrombus. Recommendation: Medical therapy.       LOS: 2 days   Ilani Otterson Rito Ehrlich  [facility] Pager on [email or web address]  10/10/2022, 9:28 AM

## 2022-10-11 DIAGNOSIS — N390 Urinary tract infection, site not specified: Secondary | ICD-10-CM | POA: Diagnosis not present

## 2022-10-11 DIAGNOSIS — I5021 Acute systolic (congestive) heart failure: Secondary | ICD-10-CM | POA: Diagnosis not present

## 2022-10-11 DIAGNOSIS — I5020 Unspecified systolic (congestive) heart failure: Secondary | ICD-10-CM | POA: Diagnosis not present

## 2022-10-11 LAB — BASIC METABOLIC PANEL
Anion gap: 12 (ref 5–15)
BUN: 28 mg/dL — ABNORMAL HIGH (ref 8–23)
CO2: 24 mmol/L (ref 22–32)
Calcium: 9.7 mg/dL (ref 8.9–10.3)
Chloride: 94 mmol/L — ABNORMAL LOW (ref 98–111)
Creatinine, Ser: 1.54 mg/dL — ABNORMAL HIGH (ref 0.61–1.24)
GFR, Estimated: 47 mL/min — ABNORMAL LOW (ref >60)
Glucose, Bld: 105 mg/dL — ABNORMAL HIGH (ref 70–99)
Potassium: 3.9 mmol/L (ref 3.5–5.1)
Sodium: 130 mmol/L — ABNORMAL LOW (ref 135–145)

## 2022-10-11 LAB — BRAIN NATRIURETIC PEPTIDE: B Natriuretic Peptide: 183.9 pg/mL — ABNORMAL HIGH (ref 0.0–100.0)

## 2022-10-11 LAB — MAGNESIUM: Magnesium: 2 mg/dL (ref 1.7–2.4)

## 2022-10-11 MED ORDER — POTASSIUM CHLORIDE CRYS ER 20 MEQ PO TBCR
20.0000 meq | EXTENDED_RELEASE_TABLET | Freq: Once | ORAL | Status: AC
  Administered 2022-10-11: 20 meq via ORAL
  Filled 2022-10-11: qty 1

## 2022-10-11 MED ORDER — BENZONATATE 100 MG PO CAPS
100.0000 mg | ORAL_CAPSULE | Freq: Three times a day (TID) | ORAL | Status: DC | PRN
  Administered 2022-10-11 – 2022-10-13 (×3): 100 mg via ORAL
  Filled 2022-10-11 (×3): qty 1

## 2022-10-11 MED ORDER — LORATADINE 10 MG PO TABS
10.0000 mg | ORAL_TABLET | Freq: Every day | ORAL | Status: DC | PRN
  Administered 2022-10-11 – 2022-10-12 (×2): 10 mg via ORAL
  Filled 2022-10-11 (×2): qty 1

## 2022-10-11 NOTE — Progress Notes (Addendum)
Rounding Note    Patient Name: Billy Rasmussen Date of Encounter: 10/11/2022  [facility] Cardiologist: Nicki Guadalajara, MD   Subjective   Breathing is OK  No CP    Shaving   Inpatient Medications    Scheduled Meds:  ciprofloxacin  500 mg Oral BID   enoxaparin (LOVENOX) injection  40 mg Subcutaneous Q24H   levothyroxine  25 mcg Oral Q0600   sodium chloride flush  3 mL Intravenous Q12H   Continuous Infusions:  sodium chloride 10 mL/hr at 10/08/22 0859   sodium chloride     PRN Meds: sodium chloride, sodium chloride, acetaminophen **OR** acetaminophen, acetaminophen, albuterol, guaiFENesin, ondansetron **OR** ondansetron (ZOFRAN) IV, sodium chloride flush   Vital Signs    Vitals:   10/10/22 1100 10/10/22 1727 10/10/22 2029 10/11/22 0427  BP: 127/75 111/72 122/80 113/73  Pulse: 80 79 86 82  Resp: 20 20 20    Temp: 97.8 F (36.6 C) 98.5 F (36.9 C) 97.9 F (36.6 C) 98.1 F (36.7 C)  TempSrc: Oral Oral Oral Oral  SpO2: 96% 97% 98% 99%  Weight:      Height:        Intake/Output Summary (Last 24 hours) at 10/11/2022 0646 Last data filed at 10/10/2022 1752 Gross per 24 hour  Intake --  Output 2300 ml  Net -2300 ml   Net neg 2.4 L      10/07/2022    8:00 PM 10/07/2022   12:56 AM 10/06/2022    6:38 PM  Last 3 Weights  Weight (lbs) 211 lb 214 lb 9.6 oz 215 lb 12.8 oz  Weight (kg) 95.709 kg 97.342 kg 97.886 kg      Telemetry   SR   - Personally Reviewed    Physical Exam   GEN: No acute distress.   Cardiac: RRR, No murmur  .  Respiratory: CTA    GI: Soft, nontender, non-distended  MS:  No LE edema   Labs    High Sensitivity Troponin:   Recent Labs  Lab 09/12/22 0958 09/27/22 0302 09/27/22 0550 10/06/22 1915 10/06/22 2102  TROPONINIHS 24* 27* 25* 31* 28*     Chemistry Recent Labs  Lab 10/06/22 1915 10/07/22 0413 10/07/22 1107 10/08/22 0354 10/09/22 1112 10/09/22 1623 10/09/22 1625 10/09/22 1628 10/10/22 0214  NA 127* 132*   <  > 134* 133*   < > 138 138 134*  K 3.9 4.0   < > 4.0 4.2   < > 4.4 4.3 4.2  CL 94* 100   < > 104 100  --   --   --  100  CO2 24 25   < > 24 24  --   --   --  24  GLUCOSE 139* 95   < > 98 101*  --   --   --  100*  BUN 31* 27*   < > 25* 20  --   --   --  21  CREATININE 1.65* 1.47*   < > 1.35* 1.42*  --   --   --  1.43*  CALCIUM 9.1 9.2   < > 9.6 9.6  --   --   --  9.8  MG  --  2.1  --   --   --   --   --   --   --   PROT 7.4 6.9  --   --   --   --   --   --   --  ALBUMIN 3.9 3.5  --   --   --   --   --   --   --   AST 28 23  --   --   --   --   --   --   --   ALT 17 13  --   --   --   --   --   --   --   ALKPHOS 43 38  --   --   --   --   --   --   --   BILITOT 1.5* 1.2  --   --   --   --   --   --   --   GFRNONAA 43* 50*   < > 55* 52*  --   --   --  51*  ANIONGAP 9 7   < > 6 9  --   --   --  10   < > = values in this interval not displayed.    Lipids No results for input(s): "CHOL", "TRIG", "HDL", "LABVLDL", "LDLCALC", "CHOLHDL" in the last 168 hours.  Hematology Recent Labs  Lab 10/06/22 1915 10/07/22 0413 10/09/22 1623 10/09/22 1625 10/09/22 1628 10/10/22 0214  WBC 10.4 8.8  --   --   --  6.4  RBC 4.14* 4.16*  --   --   --  4.61  HGB 12.8* 12.7*   < > 13.6 13.6 13.7  HCT 37.5* 38.7*   < > 40.0 40.0 41.0  MCV 90.6 93.0  --   --   --  88.9  MCH 30.9 30.5  --   --   --  29.7  MCHC 34.1 32.8  --   --   --  33.4  RDW 14.5 14.3  --   --   --  13.8  PLT 109* 111*  --   --   --  154   < > = values in this interval not displayed.   Thyroid No results for input(s): "TSH", "FREET4" in the last 168 hours.  BNPNo results for input(s): "BNP", "PROBNP" in the last 168 hours.  DDimer  Recent Labs  Lab 10/06/22 1915  DDIMER 0.47     Radiology    CARDIAC CATHETERIZATION  Result Date: 10/09/2022   Prox RCA lesion is 30% stenosed. 1.  Mild diffuse coronary artery disease. 2.  Fick cardiac output of 7.89 L/min and Fick cardiac index of 3.85 L/min/m with the following hemodynamics:   Right atrial pressure mean of 6 mmHg  Right ventricular pressure 50/3 with a end-diastolic pressure of 6 mmHg  Wedge pressure mean of 27 mmHg  Pulmonary artery pressure 55/26 with a mean of 37 mmHg  Pulmonary vascular resistance of 1.84 Woods units consistent with pulmonary venous hypertension  Pulmonary artery pulsatility index of 4.1 3.  LVEDP assessment was deferred due to the presence of left ventricular thrombus. Recommendation: Medical therapy.    Cardiac Studies   Right and left heart cath - scheduled for today    Prox RCA lesion is 30% stenosed.   1.  Mild diffuse coronary artery disease. 2.  Fick cardiac output of 7.89 L/min and Fick cardiac index of 3.85 L/min/m with the following hemodynamics:            Right atrial pressure mean of 6 mmHg            Right ventricular pressure 50/3 with a end-diastolic pressure of 6 mmHg  Wedge pressure mean of 27 mmHg            Pulmonary artery pressure 55/26 with a mean of 37 mmHg            Pulmonary vascular resistance of 1.84 Woods units consistent with pulmonary venous hypertension            Pulmonary artery pulsatility index of 4.1 3.  LVEDP assessment was deferred due to the presence of left ventricular thrombus.   Recommendation: Medical therapy.  Echo 10/07/22:  1. Left ventricular ejection fraction, by estimation, is <20%. The left  ventricle has severely decreased function. The left ventricle demonstrates  global hypokinesis with septal dyssynergy. The left ventricular internal  cavity size was mildly to  moderately dilated. There is mild left ventricular hypertrophy. Left  ventricular diastolic parameters are indeterminate.   2. Definity contrast shows loosely organized thrombotic material and slow  flow in the apical inferolateral distribution, but no formed thrombus.   3. Right ventricular systolic function is mildly reduced. The right  ventricular size is normal. There is moderately elevated pulmonary artery   systolic pressure. The estimated right ventricular systolic pressure is  44.0 mmHg.   4. Left atrial size was severely dilated.   5. The mitral valve is degenerative. Mild mitral valve regurgitation.   6. The aortic valve was not well visualized. There is mild calcification  of the aortic valve. Aortic valve regurgitation is not visualized.   7. Aortic dilatation noted. There is mild dilatation of the aortic root,  measuring 41 mm. There is moderate dilatation of the ascending aorta,  measuring 45 mm.   8. The inferior vena cava is dilated in size with >50% respiratory  variability, suggesting right atrial pressure of 8 mmHg.    Patient Profile     77 y.o. male with history of  bladder cancer s/p left nephrectomy, cystoprostatectomy , fibromyalgia, hypertension, OSA not on CPAP, and ascending aortic aneurysm of 4.5 cm, and psoriasis who initially presented to clinic to see Dr. Bjorn Pippin for ER follow-up for concern for HF where he was noted to be febrile with hypotension prompting referral to ER. In the ER, TTE notable for LVEF 20% with global hypokinesis and septal dyssynergy, mild RV dysffunction, moderate PASP, mild MR, RAP 8. Cardiology is now consulted for management of systolic HF.   Assessment & Plan    Acute systolic heart failure - she was seen by Dr. Bjorn Pippin 10/06/22 and was dypsneic and febrile, he recommended ER evaluation - echo with LVEF 20% with septal dyssynergy  R and L heart cath showed   PCWP is high at 27   CI 3.85     He has diuresed 4.27 L since admit  BNP 184 this am   With bump in BUN/Cr held Lasix   He was also started on spironolactone which I held.   Not on ACEI (was on  lisinopril 5 on admit) NO SGLT2 I given UTI hx   Will need to be seen in close follow up to start/resume       CAD  Minimal CAD at cath   30% RCA    CT scan on 09/27/22 shows multiple coronary calcifications       Renal  PT with 1 kidney    Baseline renal function appears to be near  1.3 Today Cr is 1.54  BUN 28   Na 130 HOld lasix for now   May be a little prerenal  Would reassess tomorrow  LIPids  LDL 94  HDL 28  Trig 171   Place on low dose statin   LIpitor 10 mg   Febrile illness - he had a fever of 101.8 on the night prior to presentation with SOB - treating for UTI, per primary Currently afebrile   Ascending aortic aneurysm 45 mm on CTA Recheck in 1 year Control BP   Hypertension BP has been labile, controlled now        For questions or updates, please contact [facility] Please consult [email or web address] for contact info under        Signed, Dietrich Pates, MD  10/11/2022, 6:46 AM

## 2022-10-11 NOTE — Progress Notes (Signed)
[facility] PROGRESS NOTE   TRINTON MACFADYEN ZOX:[id number] DOB: 24-Aug-1945 DOA: 10/06/2022  PCP: Raliegh Ip, DO  Brief History/Interval Summary: 77 y.o. male with medical history significant of hypertension, hypothyroidism, history of bladder cancer status post left nephrectomy, cystoprostatectomy, ascending aortic aneurysm who presented to the emergency department due to 3-week onset of shortness of breath and generalized weakness.  He was also complaining of fever.  Patient has a urostomy.  UA suggesting infection.  He was hospitalized for further management.  Echocardiogram subsequently showed significantly lower left Triklo EF of 20%.  He was transferred over to [facility] for further cardiac workup.    Consultants: Cardiology  Procedures:  Transthoracic echocardiogram  Right and left heart catheterization 1.  Mild diffuse coronary artery disease. 2.  Fick cardiac output of 7.89 L/min and Fick cardiac index of 3.85 L/min/m with the following hemodynamics:            Right atrial pressure mean of 6 mmHg            Right ventricular pressure 50/3 with a end-diastolic pressure of 6 mmHg            Wedge pressure mean of 27 mmHg            Pulmonary artery pressure 55/26 with a mean of 37 mmHg            Pulmonary vascular resistance of 1.84 Woods units consistent with pulmonary venous hypertension            Pulmonary artery pulsatility index of 4.1 3.  LVEDP assessment was deferred due to the presence of left ventricular thrombus.   Subjective/Interval History: Patient mentioned that he is feeling better this morning.  He urinated a lot yesterday.  Denies any chest pain nausea or vomiting.     Assessment/Plan:  Acute systolic CHF Echocardiogram shows EF of 20% global hypokinesis noted.  Organized thrombotic material without any formed thrombus was also noted. Seen by cardiology.  Transferred to [facility] for further workup. Patient underwent cardiac  catheterization on 5/10.  Mild CAD was noted. Patient given a dose of furosemide yesterday.  Cardiology to reevaluate today.  Increase in creatinine noted.  Urinary tract infection in the setting of urostomy/previous history of bladder cancer and nephrectomy Patient has lone kidney.  UA was abnormal.  Patient started on ciprofloxacin.  ESBL infection in 2021.   It does not appear that urine culture was ever sent.  Plan for a 7-day course of ciprofloxacin. WBC noted to be normal.  He is afebrile.  Chronic kidney disease stage IIIa/long kidney Patient is status post left nephrectomy in the past.  Baseline creatinine 1.3- 1.5. Slight rise in creatinine noted today.  He did get furosemide yesterday. Continue to monitor urine output.  Avoid nephrotoxic agents.  Recheck labs tomorrow.    Essential hypertension All medications currently on hold including his diuretics and lisinopril.  Blood pressure is reasonably well-controlled.  Hyponatremia Initially came in with sodium level of 127 which was thought to be due to hypovolemia.  He was hydrated with improvement in sodium levels.  Chronic thrombocytopenia Stable.  Hypothyroidism Continue levothyroxine.  Obesity Estimated body mass index is 34.06 kg/m as calculated from the following:   Height as of this encounter: 5\' 6"  (1.676 m).   Weight as of this encounter: 95.7 kg.   DVT Prophylaxis: Lovenox Code Status: Full code Family Communication: Discussed with the patient Disposition Plan: Hopefully home when improved  Medications: Scheduled:  ciprofloxacin  500 mg Oral BID   enoxaparin (LOVENOX) injection  40 mg Subcutaneous Q24H   levothyroxine  25 mcg Oral Q0600   sodium chloride flush  3 mL Intravenous Q12H   Continuous:  sodium chloride 10 mL/hr at 10/08/22 0859   sodium chloride     JWJ:XBJYNW chloride, sodium chloride, acetaminophen **OR** acetaminophen, acetaminophen, albuterol, guaiFENesin, ondansetron **OR**  ondansetron (ZOFRAN) IV, sodium chloride flush  Antibiotics: Anti-infectives (From admission, onward)    Start     Dose/Rate Route Frequency Ordered Stop   10/10/22 1030  ciprofloxacin (CIPRO) tablet 500 mg        500 mg Oral 2 times daily 10/10/22 0934 10/12/22 0759   10/07/22 0800  ciprofloxacin (CIPRO) IVPB 400 mg  Status:  Discontinued        400 mg 200 mL/hr over 60 Minutes Intravenous Every 12 hours 10/07/22 0211 10/10/22 0934   10/06/22 2130  cefTRIAXone (ROCEPHIN) 1 g in sodium chloride 0.9 % 100 mL IVPB        1 g 200 mL/hr over 30 Minutes Intravenous  Once 10/06/22 2128 10/06/22 2237       Objective:  Vital Signs  Vitals:   10/10/22 1100 10/10/22 1727 10/10/22 2029 10/11/22 0427  BP: 127/75 111/72 122/80 113/73  Pulse: 80 79 86 82  Resp: 20 20 20    Temp: 97.8 F (36.6 C) 98.5 F (36.9 C) 97.9 F (36.6 C) 98.1 F (36.7 C)  TempSrc: Oral Oral Oral Oral  SpO2: 96% 97% 98% 99%  Weight:      Height:        Intake/Output Summary (Last 24 hours) at 10/11/2022 1025 Last data filed at 10/10/2022 1752 Gross per 24 hour  Intake --  Output 1850 ml  Net -1850 ml    Filed Weights   10/06/22 1838 10/07/22 0056 10/07/22 2000  Weight: 97.9 kg 97.3 kg 95.7 kg    General appearance: Awake alert.  In no distress Resp: Clear to auscultation bilaterally.  Normal effort Cardio: S1-S2 is normal regular.  No S3-S4.  No rubs murmurs or bruit GI: Abdomen is soft.  Nontender nondistended.  Bowel sounds are present normal.  No masses organomegaly Extremities: Mild edema bilateral lower extremities. No obvious focal neurological deficits.   Lab Results:  Data Reviewed: I have personally reviewed following labs and reports of the imaging studies  CBC: Recent Labs  Lab 10/06/22 1915 10/07/22 0413 10/09/22 1623 10/09/22 1625 10/09/22 1628 10/10/22 0214  WBC 10.4 8.8  --   --   --  6.4  NEUTROABS 8.5*  --   --   --   --   --   HGB 12.8* 12.7* 12.9* 13.6 13.6 13.7  HCT  37.5* 38.7* 38.0* 40.0 40.0 41.0  MCV 90.6 93.0  --   --   --  88.9  PLT 109* 111*  --   --   --  154     Basic Metabolic Panel: Recent Labs  Lab 10/07/22 0413 10/07/22 1107 10/07/22 1617 10/08/22 0354 10/09/22 1112 10/09/22 1623 10/09/22 1625 10/09/22 1628 10/10/22 0214 10/11/22 0703  NA 132*   < > 131* 134* 133* 139 138 138 134* 130*  K 4.0   < > 4.4 4.0 4.2 4.3 4.4 4.3 4.2 3.9  CL 100   < > 99 104 100  --   --   --  100 94*  CO2 25   < > 25 24 24   --   --   --  24 24  GLUCOSE 95   < > 112* 98 101*  --   --   --  100* 105*  BUN 27*   < > 27* 25* 20  --   --   --  21 28*  CREATININE 1.47*   < > 1.37* 1.35* 1.42*  --   --   --  1.43* 1.54*  CALCIUM 9.2   < > 9.2 9.6 9.6  --   --   --  9.8 9.7  MG 2.1  --   --   --   --   --   --   --   --  2.0  PHOS 2.0*  --   --   --   --   --   --   --   --   --    < > = values in this interval not displayed.     GFR: Estimated Creatinine Clearance: 45.6 mL/min (A) (by C-G formula based on SCr of 1.54 mg/dL (H)).  Liver Function Tests: Recent Labs  Lab 10/06/22 1915 10/07/22 0413  AST 28 23  ALT 17 13  ALKPHOS 43 38  BILITOT 1.5* 1.2  PROT 7.4 6.9  ALBUMIN 3.9 3.5       Recent Results (from the past 240 hour(s))  SARS Coronavirus 2 by RT PCR (hospital order, performed in [facility] hospital lab) *cepheid single result test* Anterior Nasal Swab     Status: None   Collection Time: 10/06/22  6:43 PM   Specimen: Anterior Nasal Swab  Result Value Ref Range Status   SARS Coronavirus 2 by RT PCR NEGATIVE NEGATIVE Final    Comment: (NOTE) SARS-CoV-2 target nucleic acids are NOT DETECTED.  The SARS-CoV-2 RNA is generally detectable in upper and lower respiratory specimens during the acute phase of infection. The lowest concentration of SARS-CoV-2 viral copies this assay can detect is 250 copies / mL. A negative result does not preclude SARS-CoV-2 infection and should not be used as the sole basis for treatment or  other patient management decisions.  A negative result may occur with improper specimen collection / handling, submission of specimen other than nasopharyngeal swab, presence of viral mutation(s) within the areas targeted by this assay, and inadequate number of viral copies (<250 copies / mL). A negative result must be combined with clinical observations, patient history, and epidemiological information.  Fact Sheet for Patients:   RoadLapTop.co.za  Fact Sheet for Healthcare Providers: http://kim-miller.com/  This test is not yet approved or  cleared by the [place] FDA and has been authorized for detection and/or diagnosis of SARS-CoV-2 by FDA under an Emergency Use Authorization (EUA).  This EUA will remain in effect (meaning this test can be used) for the duration of the COVID-19 declaration under Section 564(b)(1) of the Act, 21 U.S.C. section 360bbb-3(b)(1), unless the authorization is terminated or revoked sooner.  Performed at [facility], [address], [place], [place] [address]       Radiology Studies: CARDIAC CATHETERIZATION  Result Date: 10/09/2022   Prox RCA lesion is 30% stenosed. 1.  Mild diffuse coronary artery disease. 2.  Fick cardiac output of 7.89 L/min and Fick cardiac index of 3.85 L/min/m with the following hemodynamics:  Right atrial pressure mean of 6 mmHg  Right ventricular pressure 50/3 with a end-diastolic pressure of 6 mmHg  Wedge pressure mean of 27 mmHg  Pulmonary artery pressure 55/26 with a mean of 37 mmHg  Pulmonary vascular resistance of 1.84  Woods units consistent with pulmonary venous hypertension  Pulmonary artery pulsatility index of 4.1 3.  LVEDP assessment was deferred due to the presence of left ventricular thrombus. Recommendation: Medical therapy.       LOS: 3 days   Donielle Radziewicz [employer] on [email or web address]  10/11/2022, 10:25 AM

## 2022-10-12 ENCOUNTER — Encounter (HOSPITAL_COMMUNITY): Payer: Self-pay | Admitting: Internal Medicine

## 2022-10-12 ENCOUNTER — Inpatient Hospital Stay (HOSPITAL_COMMUNITY): Payer: Medicare HMO

## 2022-10-12 DIAGNOSIS — N390 Urinary tract infection, site not specified: Secondary | ICD-10-CM | POA: Diagnosis not present

## 2022-10-12 DIAGNOSIS — N179 Acute kidney failure, unspecified: Secondary | ICD-10-CM | POA: Diagnosis not present

## 2022-10-12 DIAGNOSIS — I5021 Acute systolic (congestive) heart failure: Secondary | ICD-10-CM | POA: Diagnosis not present

## 2022-10-12 DIAGNOSIS — I5041 Acute combined systolic (congestive) and diastolic (congestive) heart failure: Secondary | ICD-10-CM | POA: Diagnosis not present

## 2022-10-12 LAB — BASIC METABOLIC PANEL
Anion gap: 7 (ref 5–15)
BUN: 31 mg/dL — ABNORMAL HIGH (ref 8–23)
CO2: 24 mmol/L (ref 22–32)
Calcium: 9.5 mg/dL (ref 8.9–10.3)
Chloride: 99 mmol/L (ref 98–111)
Creatinine, Ser: 1.8 mg/dL — ABNORMAL HIGH (ref 0.61–1.24)
GFR, Estimated: 39 mL/min — ABNORMAL LOW (ref >60)
Glucose, Bld: 96 mg/dL (ref 70–99)
Potassium: 4.4 mmol/L (ref 3.5–5.1)
Sodium: 130 mmol/L — ABNORMAL LOW (ref 135–145)

## 2022-10-12 LAB — CBC
HCT: 44.6 % (ref 39.0–52.0)
Hemoglobin: 14.9 g/dL (ref 13.0–17.0)
MCH: 29.8 pg (ref 26.0–34.0)
MCHC: 33.4 g/dL (ref 30.0–36.0)
MCV: 89.2 fL (ref 80.0–100.0)
Platelets: 159 10*3/uL (ref 150–400)
RBC: 5 MIL/uL (ref 4.22–5.81)
RDW: 13.6 % (ref 11.5–15.5)
WBC: 7 10*3/uL (ref 4.0–10.5)
nRBC: 0 % (ref 0.0–0.2)

## 2022-10-12 MED ORDER — METOPROLOL SUCCINATE ER 25 MG PO TB24
25.0000 mg | ORAL_TABLET | Freq: Every day | ORAL | Status: DC
  Administered 2022-10-12 – 2022-10-13 (×2): 25 mg via ORAL
  Filled 2022-10-12 (×2): qty 1

## 2022-10-12 MED ORDER — FLUTICASONE PROPIONATE 50 MCG/ACT NA SUSP
1.0000 | Freq: Every day | NASAL | Status: DC
  Administered 2022-10-12 – 2022-10-13 (×2): 1 via NASAL
  Filled 2022-10-12: qty 16

## 2022-10-12 NOTE — Care Management Important Message (Signed)
Important Message  Patient Details  Name: Billy Rasmussen MRN: [id number] Date of Birth: Feb 03, 1946   Medicare Important Message Given:  Yes     Renie Ora 10/12/2022, 11:22 AM

## 2022-10-12 NOTE — Progress Notes (Signed)
   Heart Failure Stewardship Pharmacist Progress Note   PCP: Raliegh Ip, DO PCP-Cardiologist: Nicki Guadalajara, MD   HPI:  77 YO male with a PMH of hypertension, hypothyroidism, history of bladder cancer status post left nephrectomy, cystoprostatectomy, ascending aortic aneurysm   Patient presented to ED 5/7 with complaints of shortness of breath and generalized weakness x3 weeks. Patient had a recent ED admission (09/27/22) that was suggestive of pulmonary edema on both CXR and CTPA--patient was discharged on Lasix 20 mg daily and told to follow-up with outpatient cardiology. Lasix was discontinued during the outpatient cardiology appointment on 10/06/22 due to hypotension and suspected infection and patient was transferred to [facility]. Echo from 5/8 showing LVEF <20%, global hypokinesis, septal dyssenergy with left bundle branch block by ECG, RV dysfunction and RVSP 44 mmHg, mild MVR. CXR from this admission is not concerning for pulmonary edema. R/LHC on 5/10 showed mild diffuse CAD, RAP 7 mmHg, PAP 37 mmHg, PWCP 27 mmHg, CO/CI 7.9/3.9 L/min.  Current HF Medications: None  Prior to admission HF Medications: Diuretic: furosemide 20 mg daily (started 09/27/22) ACE/ARB/ARNI: lisinopril 5 mg daily  Pertinent Lab Values: Serum creatinine 1.80, BUN 31, Potassium 4.4, Sodium 130, Magnesium 2.0, A1c 5.1%   Vital Signs: Weight: 211 lbs (admission weight: 215 lbs) Blood pressure: 110/70s  Heart rate: 80s  I/O: -1.7L yesterday; net -7.2L  Medication Assistance / Insurance Benefits Check: Does the patient have prescription insurance?  Yes Type of insurance plan: Medicare  Does the patient qualify for medication assistance through manufacturers or grants?   Yes Eligible grants and/or patient assistance programs: None currently  Medication assistance applications in progress: None  Medication assistance applications approved: None Approved medication assistance renewals will be completed by:  N/A  Outpatient Pharmacy:  Prior to admission [facility] Is the patient willing to use [facility] pharmacy at discharge? Yes Is the patient willing to transition their outpatient pharmacy to utilize a [facility] outpatient pharmacy?   Pending   Assessment: 1. Acute systolic CHF (LVEF <20%) due to NICM. NYHA class III symptoms. - Scr increased 1.54>>1.80 even with holding lasix. Strict I's and O's. Keep K >4 and Mg >2.  - Hold beta blocker given acute CHF exacerbation and RV dysfunction seen on echo - Hold ACEi/ARB/ARNi, lasix, and MRA with AKI - Hold SGLT2i given patient's recurrent UTI   Plan: 1) Medication changes recommended at this time: - Holding lasix and GDMT with AKI, monitor for resolution  2) Patient assistance: - None currently   3)  Education  - To be completed prior to discharge   Sharen Hones, PharmD, BCPS Heart Failure Engineer, building services Phone [phone]

## 2022-10-12 NOTE — Progress Notes (Addendum)
Rounding Note    Patient Name: Billy Rasmussen Date of Encounter: 10/12/2022  [facility] Cardiologist: Nicki Guadalajara, MD   Subjective   Patient resting comfortably in bed. Reports intermittent cough. Denies focal dyspnea, chest pain, palpitations this AM.  Inpatient Medications    Scheduled Meds:  enoxaparin (LOVENOX) injection  40 mg Subcutaneous Q24H   levothyroxine  25 mcg Oral Q0600   sodium chloride flush  3 mL Intravenous Q12H   Continuous Infusions:  sodium chloride 10 mL/hr at 10/08/22 0859   sodium chloride     PRN Meds: sodium chloride, sodium chloride, acetaminophen **OR** acetaminophen, acetaminophen, albuterol, benzonatate, guaiFENesin, loratadine, ondansetron **OR** ondansetron (ZOFRAN) IV, sodium chloride flush   Vital Signs    Vitals:   10/11/22 0427 10/11/22 1507 10/11/22 2027 10/12/22 0419  BP: 113/73 116/73 120/83 117/79  Pulse: 82  87 74  Resp:  17 18 18   Temp: 98.1 F (36.7 C) 97.7 F (36.5 C) 98.7 F (37.1 C)   TempSrc: Oral Oral Oral   SpO2: 99%  98% 97%  Weight:      Height:        Intake/Output Summary (Last 24 hours) at 10/12/2022 0641 Last data filed at 10/12/2022 0552 Gross per 24 hour  Intake --  Output 2975 ml  Net -2975 ml      10/07/2022    8:00 PM 10/07/2022   12:56 AM 10/06/2022    6:38 PM  Last 3 Weights  Weight (lbs) 211 lb 214 lb 9.6 oz 215 lb 12.8 oz  Weight (kg) 95.709 kg 97.342 kg 97.886 kg      Telemetry    Sinus rhythm with 1st degree AV block - Personally Reviewed  ECG    No new tracing - Personally Reviewed  Physical Exam   GEN: No acute distress.   Neck: No JVD Cardiac: RRR, no murmurs, rubs, or gallops.  Respiratory: Clear to auscultation bilaterally. GI: Soft, nontender, non-distended  MS: No edema; No deformity. Neuro:  Nonfocal  Psych: Normal affect   Labs    High Sensitivity Troponin:   Recent Labs  Lab 09/12/22 0958 09/27/22 0302 09/27/22 0550 10/06/22 1915 10/06/22 2102   TROPONINIHS 24* 27* 25* 31* 28*     Chemistry Recent Labs  Lab 10/06/22 1915 10/07/22 0413 10/07/22 1107 10/10/22 0214 10/11/22 0703 10/12/22 0252  NA 127* 132*   < > 134* 130* 130*  K 3.9 4.0   < > 4.2 3.9 4.4  CL 94* 100   < > 100 94* 99  CO2 24 25   < > 24 24 24   GLUCOSE 139* 95   < > 100* 105* 96  BUN 31* 27*   < > 21 28* 31*  CREATININE 1.65* 1.47*   < > 1.43* 1.54* 1.80*  CALCIUM 9.1 9.2   < > 9.8 9.7 9.5  MG  --  2.1  --   --  2.0  --   PROT 7.4 6.9  --   --   --   --   ALBUMIN 3.9 3.5  --   --   --   --   AST 28 23  --   --   --   --   ALT 17 13  --   --   --   --   ALKPHOS 43 38  --   --   --   --   BILITOT 1.5* 1.2  --   --   --   --  GFRNONAA 43* 50*   < > 51* 47* 39*  ANIONGAP 9 7   < > 10 12 7    < > = values in this interval not displayed.    Lipids No results for input(s): "CHOL", "TRIG", "HDL", "LABVLDL", "LDLCALC", "CHOLHDL" in the last 168 hours.  Hematology Recent Labs  Lab 10/07/22 0413 10/09/22 1623 10/09/22 1628 10/10/22 0214 10/12/22 0252  WBC 8.8  --   --  6.4 7.0  RBC 4.16*  --   --  4.61 5.00  HGB 12.7*   < > 13.6 13.7 14.9  HCT 38.7*   < > 40.0 41.0 44.6  MCV 93.0  --   --  88.9 89.2  MCH 30.5  --   --  29.7 29.8  MCHC 32.8  --   --  33.4 33.4  RDW 14.3  --   --  13.8 13.6  PLT 111*  --   --  154 159   < > = values in this interval not displayed.   Thyroid No results for input(s): "TSH", "FREET4" in the last 168 hours.  BNP Recent Labs  Lab 10/11/22 1020  BNP 183.9*    DDimer  Recent Labs  Lab 10/06/22 1915  DDIMER 0.47     Radiology    No results found.  Cardiac Studies   10/07/22 TTE  IMPRESSIONS     1. Left ventricular ejection fraction, by estimation, is <20%. The left  ventricle has severely decreased function. The left ventricle demonstrates  global hypokinesis with septal dyssynergy. The left ventricular internal  cavity size was mildly to  moderately dilated. There is mild left ventricular hypertrophy.  Left  ventricular diastolic parameters are indeterminate.   2. Definity contrast shows loosely organized thrombotic material and slow  flow in the apical inferolateral distribution, but no formed thrombus.   3. Right ventricular systolic function is mildly reduced. The right  ventricular size is normal. There is moderately elevated pulmonary artery  systolic pressure. The estimated right ventricular systolic pressure is  44.0 mmHg.   4. Left atrial size was severely dilated.   5. The mitral valve is degenerative. Mild mitral valve regurgitation.   6. The aortic valve was not well visualized. There is mild calcification  of the aortic valve. Aortic valve regurgitation is not visualized.   7. Aortic dilatation noted. There is mild dilatation of the aortic root,  measuring 41 mm. There is moderate dilatation of the ascending aorta,  measuring 45 mm.   8. The inferior vena cava is dilated in size with >50% respiratory  variability, suggesting right atrial pressure of 8 mmHg.   Comparison(s): Prior images reviewed side by side. LVEF has decreased  substantially compared with prior study, now <20%.   FINDINGS   Left Ventricle: Left ventricular ejection fraction, by estimation, is  <20%. The left ventricle has severely decreased function. The left  ventricle demonstrates global hypokinesis. Definity contrast agent was  given IV to delineate the left ventricular  endocardial borders. The left ventricular internal cavity size was mildly  to moderately dilated. There is mild left ventricular hypertrophy.  Abnormal (paradoxical) septal motion, consistent with left bundle branch  block. Left ventricular diastolic  parameters are indeterminate.   Right Ventricle: The right ventricular size is normal. No increase in  right ventricular wall thickness. Right ventricular systolic function is  mildly reduced. There is moderately elevated pulmonary artery systolic  pressure. The tricuspid regurgitant   velocity is 3.00 m/s, and with an assumed  right atrial pressure of 8 mmHg,  the estimated right ventricular systolic pressure is 44.0 mmHg.   Left Atrium: Left atrial size was severely dilated.   Right Atrium: Right atrial size was normal in size.   Pericardium: There is no evidence of pericardial effusion.   Mitral Valve: The mitral valve is degenerative in appearance. There is  mild thickening of the mitral valve leaflet(s). There is mild  calcification of the mitral valve leaflet(s). Mild mitral annular  calcification. Mild mitral valve regurgitation.   Tricuspid Valve: The tricuspid valve is grossly normal. Tricuspid valve  regurgitation is mild.   Aortic Valve: The aortic valve was not well visualized. There is mild  calcification of the aortic valve. There is mild aortic valve annular  calcification. Aortic valve regurgitation is not visualized.   Pulmonic Valve: The pulmonic valve was not well visualized. Pulmonic valve  regurgitation is not visualized.   Aorta: Aortic dilatation noted. There is mild dilatation of the aortic  root, measuring 41 mm. There is moderate dilatation of the ascending  aorta, measuring 45 mm.   Venous: The inferior vena cava is dilated in size with greater than 50%  respiratory variability, suggesting right atrial pressure of 8 mmHg.   IAS/Shunts: No atrial level shunt detected by color flow Doppler.    10/09/22 LHC/RHC    Prox RCA lesion is 30% stenosed.   1.  Mild diffuse coronary artery disease. 2.  Fick cardiac output of 7.89 L/min and Fick cardiac index of 3.85 L/min/m with the following hemodynamics:            Right atrial pressure mean of 6 mmHg            Right ventricular pressure 50/3 with a end-diastolic pressure of 6 mmHg            Wedge pressure mean of 27 mmHg            Pulmonary artery pressure 55/26 with a mean of 37 mmHg            Pulmonary vascular resistance of 1.84 Woods units consistent with pulmonary venous  hypertension            Pulmonary artery pulsatility index of 4.1 3.  LVEDP assessment was deferred due to the presence of left ventricular thrombus.  Diagnostic Dominance: Right   Patient Profile     77 y.o. male with history of  bladder cancer s/p left nephrectomy, cystoprostatectomy, fibromyalgia, hypertension, OSA not on CPAP, and ascending aortic aneurysm of 4.5 cm, and psoriasis who initially presented to clinic to see Dr. Bjorn Pippin for ER follow-up for concern for HF where he was noted to be febrile with hypotension prompting referral to ER. In the ER, TTE notable for LVEF 20% with global hypokinesis and septal dyssynergy, mild RV dysffunction, moderate PASP, mild MR, RAP 8. Cardiology is now consulted for management of systolic HF.   Assessment & Plan    Acute Combined CHF  Patient admitted with symptoms of worsening dyspnea over the past few months. TTE this admission showed LVEF 20% with septal dyssynergy. Slow flow but without formed thrombus on this TTE.LHC/RHC with mild diffuse CAD. Fick CO 7.89L/min and Fick CI 3.85. Right atrial pressure 6 mmHg, wedge pressure .   Patient net negative 7.2 L this admission, lasix held the last 2 days with increased creatinine 1.43->1.54. Creatinine still rising, 1.80 today with BUN 31. Continue holding lasix. Patient did start Spironolactone on 5/11 but since held 2/2 rising  creatinine. Potentially could restart at discharge. No ACEi/ARB/ARNI at this time with current creatinine trend No beta blocker given severely reduced LVEF No SGLT2 with recurrent UTI  Ascending aortic aneurysm  CTA chest from 09/27/22 showed 45mm ascending aortic aneurysm. Will need serial imaging in outpatient setting.  Hypertension  BP currently at goal.  Solitary kidney, s/p left nephrectomy CKD IIIa  Patient with AKI on CKD. Creatinine has risen to 1.8 today. Continue to follow closely.  Per primary team:  Fever Patient with fever prior to admission.  Has completed abx therapy per primary team. Chronic thrombocytopenia Hypothyroidism       For questions or updates, please contact [facility] Please consult [email or web address] for contact info under        Signed, Perlie Gold, PA-C  10/12/2022, 6:41 AM    Patient seen and examined.  Agree with above documentation.  On exam, patient is alert and oriented, regular rate and rhythm, no murmurs, lungs CTAB, no LE edema or JVD.  Appears euvolemic.  Would hold off on further Lasix and monitor for improvement in renal function.  Will add Toprol-XL 25 mg daily today.  Hold off on further GDMT given renal function.  Little Ishikawa, MD

## 2022-10-12 NOTE — Progress Notes (Signed)
[facility] PROGRESS NOTE   Billy Rasmussen WUJ:[id number] DOB: 17-Oct-1945 DOA: 10/06/2022  PCP: Raliegh Ip, DO  Brief History/Interval Summary: 77 y.o. male with medical history significant of hypertension, hypothyroidism, history of bladder cancer status post left nephrectomy, cystoprostatectomy, ascending aortic aneurysm who presented to the emergency department due to 3-week onset of shortness of breath and generalized weakness.  He was also complaining of fever.  Patient has a urostomy.  UA suggesting infection.  He was hospitalized for further management.  Echocardiogram subsequently showed significantly lower left Triklo EF of 20%.  He was transferred over to [facility] for further cardiac workup.    Consultants: Cardiology  Procedures:  Transthoracic echocardiogram  Right and left heart catheterization 1.  Mild diffuse coronary artery disease. 2.  Fick cardiac output of 7.89 L/min and Fick cardiac index of 3.85 L/min/m with the following hemodynamics:            Right atrial pressure mean of 6 mmHg            Right ventricular pressure 50/3 with a end-diastolic pressure of 6 mmHg            Wedge pressure mean of 27 mmHg            Pulmonary artery pressure 55/26 with a mean of 37 mmHg            Pulmonary vascular resistance of 1.84 Woods units consistent with pulmonary venous hypertension            Pulmonary artery pulsatility index of 4.1 3.  LVEDP assessment was deferred due to the presence of left ventricular thrombus.   Subjective/Interval History: Patient mentions that he feels well.  Occasional cough but no shortness of breath.  Ambulated in the hallway yesterday.  Continues to urinate.     Assessment/Plan:  Acute systolic CHF Echocardiogram shows EF of 20% global hypokinesis noted.  Organized thrombotic material without any formed thrombus was also noted. Seen by cardiology.  Transferred to [facility] for further workup. Patient underwent  cardiac catheterization on 5/10.  Mild CAD was noted. Patient given a dose of furosemide on 5/11.  Patient diuresed well but creatinine went up.  Diuretics on hold.  Cannot be given ACE inhibitor or ARB due to elevated creatinine.  Urinary tract infection in the setting of urostomy/previous history of bladder cancer and nephrectomy Patient has lone kidney.  UA was abnormal.  Patient started on ciprofloxacin.  ESBL infection in 2021.   It does not appear that urine culture was ever sent.  Patient has completed course of ciprofloxacin.  Acute on chronic kidney disease stage IIIa/lone kidney Patient is status post left nephrectomy in the past.  Baseline creatinine 1.3- 1.5. Creatinine has risen further today.  He continues to have good urine output.  Has not had imaging studies of his GU tract recently.  We will proceed with CT scan.  Monitor urine output.  Avoid nephrotoxic agents.   Essential hypertension All medications currently on hold including his diuretics and lisinopril.  Blood pressure is reasonably well-controlled.  Hyponatremia Initially came in with sodium level of 127 which was thought to be due to hypovolemia.  He was hydrated with improvement in sodium levels.  Chronic thrombocytopenia Stable.  Hypothyroidism Continue levothyroxine.  Ascending aortic aneurysm Will need outpatient monitoring  Obesity Estimated body mass index is 34.06 kg/m as calculated from the following:   Height as of this encounter: 5\' 6"  (1.676 m).  Weight as of this encounter: 95.7 kg.   DVT Prophylaxis: Lovenox Code Status: Full code Family Communication: Discussed with the patient Disposition Plan: Hopefully home when improved    Medications: Scheduled:  enoxaparin (LOVENOX) injection  40 mg Subcutaneous Q24H   fluticasone  1 spray Each Nare Daily   levothyroxine  25 mcg Oral Q0600   sodium chloride flush  3 mL Intravenous Q12H   Continuous:  sodium chloride 10 mL/hr at 10/08/22  0859   sodium chloride     WUJ:WJXBJY chloride, sodium chloride, acetaminophen **OR** acetaminophen, acetaminophen, albuterol, benzonatate, guaiFENesin, loratadine, ondansetron **OR** ondansetron (ZOFRAN) IV, sodium chloride flush  Antibiotics: Anti-infectives (From admission, onward)    Start     Dose/Rate Route Frequency Ordered Stop   10/10/22 1030  ciprofloxacin (CIPRO) tablet 500 mg        500 mg Oral 2 times daily 10/10/22 0934 10/11/22 2042   10/07/22 0800  ciprofloxacin (CIPRO) IVPB 400 mg  Status:  Discontinued        400 mg 200 mL/hr over 60 Minutes Intravenous Every 12 hours 10/07/22 0211 10/10/22 0934   10/06/22 2130  cefTRIAXone (ROCEPHIN) 1 g in sodium chloride 0.9 % 100 mL IVPB        1 g 200 mL/hr over 30 Minutes Intravenous  Once 10/06/22 2128 10/06/22 2237       Objective:  Vital Signs  Vitals:   10/11/22 0427 10/11/22 1507 10/11/22 2027 10/12/22 0419  BP: 113/73 116/73 120/83 117/79  Pulse: 82  87 74  Resp:  17 18 18   Temp: 98.1 F (36.7 C) 97.7 F (36.5 C) 98.7 F (37.1 C)   TempSrc: Oral Oral Oral   SpO2: 99%  98% 97%  Weight:      Height:        Intake/Output Summary (Last 24 hours) at 10/12/2022 0933 Last data filed at 10/12/2022 0552 Gross per 24 hour  Intake --  Output 2975 ml  Net -2975 ml    Filed Weights   10/06/22 1838 10/07/22 0056 10/07/22 2000  Weight: 97.9 kg 97.3 kg 95.7 kg    General appearance: Awake alert.  In no distress Resp: Clear to auscultation bilaterally.  Normal effort Cardio: S1-S2 is normal regular.  No S3-S4.  No rubs murmurs or bruit GI: Abdomen is soft.  Nontender nondistended.  Bowel sounds are present normal.  No masses organomegaly.  Urostomy noted. Extremities: No edema.  Full range of motion of lower extremities. Neurologic: Alert and oriented x3.  No focal neurological deficits.    Lab Results:  Data Reviewed: I have personally reviewed following labs and reports of the imaging studies  CBC: Recent  Labs  Lab 10/06/22 1915 10/07/22 0413 10/09/22 1623 10/09/22 1625 10/09/22 1628 10/10/22 0214 10/12/22 0252  WBC 10.4 8.8  --   --   --  6.4 7.0  NEUTROABS 8.5*  --   --   --   --   --   --   HGB 12.8* 12.7* 12.9* 13.6 13.6 13.7 14.9  HCT 37.5* 38.7* 38.0* 40.0 40.0 41.0 44.6  MCV 90.6 93.0  --   --   --  88.9 89.2  PLT 109* 111*  --   --   --  154 159     Basic Metabolic Panel: Recent Labs  Lab 10/07/22 0413 10/07/22 1107 10/08/22 0354 10/09/22 1112 10/09/22 1623 10/09/22 1625 10/09/22 1628 10/10/22 0214 10/11/22 0703 10/12/22 0252  NA 132*   < > 134* 133*   < >  138 138 134* 130* 130*  K 4.0   < > 4.0 4.2   < > 4.4 4.3 4.2 3.9 4.4  CL 100   < > 104 100  --   --   --  100 94* 99  CO2 25   < > 24 24  --   --   --  24 24 24   GLUCOSE 95   < > 98 101*  --   --   --  100* 105* 96  BUN 27*   < > 25* 20  --   --   --  21 28* 31*  CREATININE 1.47*   < > 1.35* 1.42*  --   --   --  1.43* 1.54* 1.80*  CALCIUM 9.2   < > 9.6 9.6  --   --   --  9.8 9.7 9.5  MG 2.1  --   --   --   --   --   --   --  2.0  --   PHOS 2.0*  --   --   --   --   --   --   --   --   --    < > = values in this interval not displayed.     GFR: Estimated Creatinine Clearance: 39 mL/min (A) (by C-G formula based on SCr of 1.8 mg/dL (H)).  Liver Function Tests: Recent Labs  Lab 10/06/22 1915 10/07/22 0413  AST 28 23  ALT 17 13  ALKPHOS 43 38  BILITOT 1.5* 1.2  PROT 7.4 6.9  ALBUMIN 3.9 3.5       Recent Results (from the past 240 hour(s))  SARS Coronavirus 2 by RT PCR (hospital order, performed in [facility] hospital lab) *cepheid single result test* Anterior Nasal Swab     Status: None   Collection Time: 10/06/22  6:43 PM   Specimen: Anterior Nasal Swab  Result Value Ref Range Status   SARS Coronavirus 2 by RT PCR NEGATIVE NEGATIVE Final    Comment: (NOTE) SARS-CoV-2 target nucleic acids are NOT DETECTED.  The SARS-CoV-2 RNA is generally detectable in upper and lower respiratory  specimens during the acute phase of infection. The lowest concentration of SARS-CoV-2 viral copies this assay can detect is 250 copies / mL. A negative result does not preclude SARS-CoV-2 infection and should not be used as the sole basis for treatment or other patient management decisions.  A negative result may occur with improper specimen collection / handling, submission of specimen other than nasopharyngeal swab, presence of viral mutation(s) within the areas targeted by this assay, and inadequate number of viral copies (<250 copies / mL). A negative result must be combined with clinical observations, patient history, and epidemiological information.  Fact Sheet for Patients:   RoadLapTop.co.za  Fact Sheet for Healthcare Providers: http://kim-miller.com/  This test is not yet approved or  cleared by the [place] FDA and has been authorized for detection and/or diagnosis of SARS-CoV-2 by FDA under an Emergency Use Authorization (EUA).  This EUA will remain in effect (meaning this test can be used) for the duration of the COVID-19 declaration under Section 564(b)(1) of the Act, 21 U.S.C. section 360bbb-3(b)(1), unless the authorization is terminated or revoked sooner.  Performed at [facility], [address] [place], [place] [address]       Radiology Studies: No results found.     LOS: 4 days   Aisling Emigh [employer] on [email or web address]  10/12/2022, 9:33 AM

## 2022-10-13 ENCOUNTER — Encounter: Payer: Self-pay | Admitting: Oncology

## 2022-10-13 ENCOUNTER — Other Ambulatory Visit (HOSPITAL_COMMUNITY): Payer: Self-pay

## 2022-10-13 DIAGNOSIS — I5041 Acute combined systolic (congestive) and diastolic (congestive) heart failure: Secondary | ICD-10-CM | POA: Diagnosis not present

## 2022-10-13 DIAGNOSIS — N179 Acute kidney failure, unspecified: Secondary | ICD-10-CM | POA: Diagnosis not present

## 2022-10-13 DIAGNOSIS — I5043 Acute on chronic combined systolic (congestive) and diastolic (congestive) heart failure: Secondary | ICD-10-CM

## 2022-10-13 DIAGNOSIS — I5023 Acute on chronic systolic (congestive) heart failure: Secondary | ICD-10-CM

## 2022-10-13 LAB — BASIC METABOLIC PANEL
Anion gap: 9 (ref 5–15)
BUN: 31 mg/dL — ABNORMAL HIGH (ref 8–23)
CO2: 26 mmol/L (ref 22–32)
Calcium: 9.9 mg/dL (ref 8.9–10.3)
Chloride: 100 mmol/L (ref 98–111)
Creatinine, Ser: 1.7 mg/dL — ABNORMAL HIGH (ref 0.61–1.24)
GFR, Estimated: 42 mL/min — ABNORMAL LOW (ref >60)
Glucose, Bld: 120 mg/dL — ABNORMAL HIGH (ref 70–99)
Potassium: 3.8 mmol/L (ref 3.5–5.1)
Sodium: 135 mmol/L (ref 135–145)

## 2022-10-13 LAB — LIPOPROTEIN A (LPA): Lipoprotein (a): 50.7 nmol/L — ABNORMAL HIGH (ref <75.0)

## 2022-10-13 MED ORDER — BENZONATATE 100 MG PO CAPS
100.0000 mg | ORAL_CAPSULE | Freq: Three times a day (TID) | ORAL | 0 refills | Status: DC | PRN
  Filled 2022-10-13: qty 20, 7d supply, fill #0

## 2022-10-13 MED ORDER — FLUTICASONE PROPIONATE 50 MCG/ACT NA SUSP
1.0000 | Freq: Every day | NASAL | 0 refills | Status: DC
  Filled 2022-10-13: qty 16, 30d supply, fill #0

## 2022-10-13 MED ORDER — METOPROLOL SUCCINATE ER 25 MG PO TB24
25.0000 mg | ORAL_TABLET | Freq: Every day | ORAL | 1 refills | Status: DC
  Filled 2022-10-13: qty 30, 30d supply, fill #0

## 2022-10-13 MED ORDER — FUROSEMIDE 20 MG PO TABS
ORAL_TABLET | ORAL | 0 refills | Status: DC
  Filled 2022-10-13: qty 30, 30d supply, fill #0

## 2022-10-13 NOTE — Progress Notes (Signed)
   Heart Failure Stewardship Pharmacist Progress Note   PCP: Raliegh Ip, DO PCP-Cardiologist: Nicki Guadalajara, MD   HPI:  77 YO male with a PMH of hypertension, hypothyroidism, history of bladder cancer status post left nephrectomy, cystoprostatectomy, ascending aortic aneurysm   Patient presented to ED 5/7 with complaints of shortness of breath and generalized weakness x3 weeks. Patient had a recent ED admission (09/27/22) that was suggestive of pulmonary edema on both CXR and CTPA--patient was discharged on Lasix 20 mg daily and told to follow-up with outpatient cardiology. Lasix was discontinued during the outpatient cardiology appointment on 10/06/22 due to hypotension and suspected infection and patient was transferred to [facility]. Echo from 5/8 showing LVEF <20%, global hypokinesis, septal dyssenergy with left bundle branch block by ECG, RV dysfunction and RVSP 44 mmHg, mild MVR. CXR from this admission is not concerning for pulmonary edema. R/LHC on 5/10 showed mild diffuse CAD, RAP 7 mmHg, PAP 37 mmHg, PCWP 27 mmHg, CO/CI 7.9/3.9 L/min.  Current HF Medications: Beta Blocker: metoprolol succinate 25 mg daily  Prior to admission HF Medications: Diuretic: furosemide 20 mg daily (started 09/27/22) ACE/ARB/ARNI: lisinopril 5 mg daily  Pertinent Lab Values: Serum creatinine 1.70, BUN 31, Potassium 3.8, Sodium 135, Magnesium 2.0, A1c 5.1%   Vital Signs: Weight: 211 lbs (admission weight: 215 lbs) Blood pressure: 120/70s  Heart rate: 70s-80s  I/O: -2.1L yesterday; net -9.2L  Medication Assistance / Insurance Benefits Check: Does the patient have prescription insurance?  Yes Type of insurance plan: Medicare  Does the patient qualify for medication assistance through manufacturers or grants?   Yes Eligible grants and/or patient assistance programs: None currently  Medication assistance applications in progress: None  Medication assistance applications approved: None Approved medication  assistance renewals will be completed by: N/A  Outpatient Pharmacy:  Prior to admission outpatient [facility] Is the patient willing to use [facility] pharmacy at discharge? Yes Is the patient willing to transition their outpatient pharmacy to utilize a [facility] outpatient pharmacy?   Pending   Assessment: 1. Acute systolic CHF (LVEF <20%) due to NICM. NYHA class III symptoms. - Scr decreased 1.80>>1.70 after holding  ACEi/ARB/ARNi, lasix, and MRA. Strict I's and O's. Keep K >4 and Mg >2.  - Agree with continuing metoprolol succinate 25 mg daily given stable CO/CI from cath - Hold ACEi/ARB/ARNi, lasix, and MRA with AKI - Hold SGLT2i given patient's recurrent UTI   Plan: 1) Medication changes recommended at this time: - Holding lasix, ACEi/ARB/ARNi, MRA, SGLT2i due to AKI and UTI--patient to follow-up with urologist outpatient  - Continue metoprolol succinate 25 mg daily  2) Patient assistance: - None currently   3)  Education  - Patient has been educated on current HF medications and potential additions to HF medication regimen - Patient verbalizes understanding that over the next few months, these medication doses may change and more medications may be added to optimize HF regimen - Patient has been educated on basic disease state pathophysiology and goals of therapy    Cherylin Mylar, PharmD PGY1 Pharmacy Resident 5/14/20249:23 AM

## 2022-10-13 NOTE — Progress Notes (Signed)
Discharge instructions reviewed with pt. Pt verbalizes understanding of instructions and able to repeat/teach back. Questions answered.  Copy of instructions given to pt. [facility] has filled pt's scripts and are ready, will pick up scripts on way to main entrance when wife arrives.  Wife is on her way, and will call when she gets to the main entrance to pick pt up. Pt getting dressed and will be ready to go when she calls him.  Belongings packed and will go with him.   Annice Needy, RN SWOT

## 2022-10-13 NOTE — Discharge Summary (Signed)
[facility]  Physician Discharge Summary   Patient ID: Billy Rasmussen MRN: [id number] DOB/AGE: December 21, 1945 77 y.o.  Admit date: 10/06/2022 Discharge date:   10/13/2022   PCP: Raliegh Ip, DO  DISCHARGE DIAGNOSES:    Acute on chronic systolic CHF (congestive heart failure) (HCC)   Obesity (BMI 30-39.9)   Essential hypertension   AKI (acute kidney injury) (HCC)   UTI (urinary tract infection)   Acquired hypothyroidism   RECOMMENDATIONS FOR OUTPATIENT FOLLOW UP: Cardiology to arrange outpatient follow-up Patient instructed to call and make an appointment with his urologist to discuss the right renal lesion PCP to please pursue further workup of incidental findings noted on CT scan as discussed below.   Home Health: None Equipment/Devices: None  CODE STATUS: Full code  DISCHARGE CONDITION: fair  Diet recommendation: Heart healthy  INITIAL HISTORY:  77 y.o. male with medical history significant of hypertension, hypothyroidism, history of bladder cancer status post left nephrectomy, cystoprostatectomy, ascending aortic aneurysm who presented to the emergency department due to 3-week onset of shortness of breath and generalized weakness.  He was also complaining of fever.  Patient has a urostomy.  UA suggesting infection.  He was hospitalized for further management.  Echocardiogram subsequently showed significantly lower left Triklo EF of 20%.  He was transferred over to [facility] for further cardiac workup.     Consultants: Cardiology   Procedures:  Transthoracic echocardiogram   Right and left heart catheterization 1.  Mild diffuse coronary artery disease. 2.  Fick cardiac output of 7.89 L/min and Fick cardiac index of 3.85 L/min/m with the following hemodynamics:            Right atrial pressure mean of 6 mmHg            Right ventricular pressure 50/3 with a end-diastolic pressure of 6 mmHg            Wedge pressure mean of 27 mmHg             Pulmonary artery pressure 55/26 with a mean of 37 mmHg            Pulmonary vascular resistance of 1.84 Woods units consistent with pulmonary venous hypertension            Pulmonary artery pulsatility index of 4.1 3.  LVEDP assessment was deferred due to the presence of left ventricular thrombus.   HOSPITAL COURSE:   Acute systolic CHF Echocardiogram shows EF of 20% global hypokinesis noted.  Organized thrombotic material without any formed thrombus was also noted. Seen by cardiology.  Transferred to [facility] for further workup. Patient underwent cardiac catheterization on 5/10.  Mild CAD was noted. Patient given a dose of furosemide on 5/11.  Patient diuresed well but creatinine went up.  Diuretics on hold.  Cannot be given ACE inhibitor or ARB due to elevated creatinine. Renal function appears to have improved this morning.  Cardiology recommends discharging patient home on metoprolol.  Lasix to be taken only in the event of weight gain.  They will arrange outpatient follow-up.   Urinary tract infection in the setting of urostomy/previous history of bladder cancer and nephrectomy Patient has lone kidney.  UA was abnormal.  Patient started on ciprofloxacin.  ESBL infection in 2021.   It does not appear that urine culture was ever sent.  Patient has completed course of ciprofloxacin.   Acute on chronic kidney disease stage IIIa/lone kidney Patient is status post left nephrectomy in the past.  Baseline  creatinine 1.3- 1.5. Creatinine increased likely due to CHF as well as diuretics.  Diuretics placed on hold. Renal function noted to be slightly better today.  Monitor urine output. CT of the renal system suggested small lesion in the right kidney.  No obstruction was noted no other acute findings were noted. Patient to discuss this further with his urologist, Dr. Berneice Heinrich.   Essential hypertension Patient to continue only on metoprolol.  ACE inhibitor to be discontinued due to elevated  creatinine.   Hyponatremia Initially came in with sodium level of 127 which was thought to be due to hypovolemia.  He was hydrated with improvement in sodium levels.   Chronic thrombocytopenia Stable.   Hypothyroidism Continue levothyroxine.   Ascending aortic aneurysm Will need outpatient monitoring   Incidental findings on CT scan Bilateral inguinal hernia was noted.  Cholelithiasis and diverticulosis was noted.  Enlarged spleen was also noted.  These issues can be addressed in the outpatient setting.   Obesity Estimated body mass index is 34.06 kg/m as calculated from the following:   Height as of this encounter: 5\' 6"  (1.676 m).   Weight as of this encounter: 95.7 kg.    Patient is stable.  Cleared by cardiology.  Okay for discharge home today.   PERTINENT LABS:  The results of significant diagnostics from this hospitalization (including imaging, microbiology, ancillary and laboratory) are listed below for reference.    Microbiology: Recent Results (from the past 240 hour(s))  SARS Coronavirus 2 by RT PCR (hospital order, performed in [facility] hospital lab) *cepheid single result test* Anterior Nasal Swab     Status: None   Collection Time: 10/06/22  6:43 PM   Specimen: Anterior Nasal Swab  Result Value Ref Range Status   SARS Coronavirus 2 by RT PCR NEGATIVE NEGATIVE Final    Comment: (NOTE) SARS-CoV-2 target nucleic acids are NOT DETECTED.  The SARS-CoV-2 RNA is generally detectable in upper and lower respiratory specimens during the acute phase of infection. The lowest concentration of SARS-CoV-2 viral copies this assay can detect is 250 copies / mL. A negative result does not preclude SARS-CoV-2 infection and should not be used as the sole basis for treatment or other patient management decisions.  A negative result may occur with improper specimen collection / handling, submission of specimen other than nasopharyngeal swab, presence of viral mutation(s)  within the areas targeted by this assay, and inadequate number of viral copies (<250 copies / mL). A negative result must be combined with clinical observations, patient history, and epidemiological information.  Fact Sheet for Patients:   RoadLapTop.co.za  Fact Sheet for Healthcare Providers: http://kim-miller.com/  This test is not yet approved or  cleared by the [place] FDA and has been authorized for detection and/or diagnosis of SARS-CoV-2 by FDA under an Emergency Use Authorization (EUA).  This EUA will remain in effect (meaning this test can be used) for the duration of the COVID-19 declaration under Section 564(b)(1) of the Act, 21 U.S.C. section 360bbb-3(b)(1), unless the authorization is terminated or revoked sooner.  Performed at [facility], [address] [place], [place] [address]      Labs:   Basic Metabolic Panel: Recent Labs  Lab 10/07/22 0413 10/07/22 1107 10/09/22 1112 10/09/22 1623 10/09/22 1628 10/10/22 0214 10/11/22 0703 10/12/22 0252 10/13/22 0825  NA 132*   < > 133*   < > 138 134* 130* 130* 135  K 4.0   < > 4.2   < > 4.3 4.2 3.9 4.4  3.8  CL 100   < > 100  --   --  100 94* 99 100  CO2 25   < > 24  --   --  24 24 24 26   GLUCOSE 95   < > 101*  --   --  100* 105* 96 120*  BUN 27*   < > 20  --   --  21 28* 31* 31*  CREATININE 1.47*   < > 1.42*  --   --  1.43* 1.54* 1.80* 1.70*  CALCIUM 9.2   < > 9.6  --   --  9.8 9.7 9.5 9.9  MG 2.1  --   --   --   --   --  2.0  --   --   PHOS 2.0*  --   --   --   --   --   --   --   --    < > = values in this interval not displayed.   Liver Function Tests: Recent Labs  Lab 10/06/22 1915 10/07/22 0413  AST 28 23  ALT 17 13  ALKPHOS 43 38  BILITOT 1.5* 1.2  PROT 7.4 6.9  ALBUMIN 3.9 3.5    CBC: Recent Labs  Lab 10/06/22 1915 10/07/22 0413 10/09/22 1623 10/09/22 1625 10/09/22 1628 10/10/22 0214 10/12/22 0252  WBC 10.4 8.8  --   --   --  6.4 7.0   NEUTROABS 8.5*  --   --   --   --   --   --   HGB 12.8* 12.7* 12.9* 13.6 13.6 13.7 14.9  HCT 37.5* 38.7* 38.0* 40.0 40.0 41.0 44.6  MCV 90.6 93.0  --   --   --  88.9 89.2  PLT 109* 111*  --   --   --  154 159    BNP: BNP (last 3 results) Recent Labs    09/27/22 0302 10/11/22 1020  BNP 245.0* 183.9*     IMAGING STUDIES CT RENAL STONE STUDY  Addendum Date: 10/12/2022   ADDENDUM REPORT: 10/12/2022 11:41 ADDENDUM: Following should be added to the impression. There is 8 mm exophytic lesion in the lower pole of right kidney. This finding was not seen in the previous examination. This may suggest a hemorrhagic cyst or new solid nodule. Follow-up renal sonogram may be considered. These results will be called to the ordering clinician or representative by the Radiologist Assistant, and communication documented in the PACS or [employer]. Electronically Signed   By: Ernie Avena M.D.   On: 10/12/2022 11:41   Result Date: 10/12/2022 CLINICAL DATA:  History of left nephrectomy, renal dysfunction EXAM: CT ABDOMEN AND PELVIS WITHOUT CONTRAST TECHNIQUE: Multidetector CT imaging of the abdomen and pelvis was performed following the standard protocol without IV contrast. RADIATION DOSE REDUCTION: This exam was performed according to the departmental dose-optimization program which includes automated exposure control, adjustment of the mA and/or kV according to patient size and/or use of iterative reconstruction technique. COMPARISON:  12/08/2021 FINDINGS: Lower chest: Visualized lower lung fields are clear. Heart is enlarged in size. Scattered coronary artery calcifications are seen. Hepatobiliary: No focal abnormalities are seen in liver. There is no dilation of bile ducts. Multiple calcified gallbladder stones are seen. Pancreas: No focal abnormalities are seen. Spleen: Spleen measures 13.6 cm in maximum diameter. Adrenals/Urinary Tract: Adrenals are unremarkable. Left kidney is not seen  consistent with left nephrectomy. Surgical staples are seen in left renal bed. Right kidney shows no hydronephrosis.  There are no right renal or right ureteral stones. Ileal conduit is seen in right mid abdomen. Distal course of right ureter is not distinctly visualized. In image 48 of series 6, there is 8 mm exophytic structure in the lower pole of right kidney. This finding was not distinctly seen on the previous examination. Urinary bladder is not seen suggesting previous removal. Stomach/Bowel: Stomach is unremarkable. Small bowel loops are not dilated. Appendix is not seen. Surgical staples are seen in the wall of the terminal ileum. There is no pericecal stranding. Scattered diverticula seen in colon. There is no evidence of focal acute diverticulitis. Vascular/Lymphatic: Scattered arterial calcifications are seen in aorta and its major branches. Reproductive: Prostate is not seen and may have been removed. Other: There is a ventral hernia containing fat medial to the ileal conduit. Right inguinal hernia containing fat and short segment of small bowel is seen. There is no evidence of incarceration or small bowel obstruction. Left inguinal hernia containing fat is seen. Musculoskeletal: Degenerative changes are noted in lumbar spine, most severe at L2-L3 level with no significant interval change. IMPRESSION: There is no evidence of intestinal obstruction or pneumoperitoneum. Status post left nephrectomy. Right kidney shows no hydronephrosis. There are no renal or ureteral stones on the right side. There is urinary diversion with ileal conduit in right mid abdomen. Status post cystectomy and prostatectomy. Scattered diverticula are seen in the colon without signs of focal diverticulitis. Ventral hernia containing fat is seen. Left inguinal hernia containing fat is seen. There is right inguinal hernia containing fat and short segment of small bowel without evidence of incarceration or obstruction.  Arteriosclerosis. Coronary artery disease. Gallbladder stones. Diverticulosis of colon. Enlarged spleen. Other findings as described in the body of the report. Electronically Signed: By: Ernie Avena M.D. On: 10/12/2022 11:24   CARDIAC CATHETERIZATION  Result Date: 10/09/2022   Prox RCA lesion is 30% stenosed. 1.  Mild diffuse coronary artery disease. 2.  Fick cardiac output of 7.89 L/min and Fick cardiac index of 3.85 L/min/m with the following hemodynamics:  Right atrial pressure mean of 6 mmHg  Right ventricular pressure 50/3 with a end-diastolic pressure of 6 mmHg  Wedge pressure mean of 27 mmHg  Pulmonary artery pressure 55/26 with a mean of 37 mmHg  Pulmonary vascular resistance of 1.84 Woods units consistent with pulmonary venous hypertension  Pulmonary artery pulsatility index of 4.1 3.  LVEDP assessment was deferred due to the presence of left ventricular thrombus. Recommendation: Medical therapy.   ECHOCARDIOGRAM COMPLETE  Result Date: 10/07/2022    ECHOCARDIOGRAM REPORT   Patient Name:   Billy Rasmussen Date of Exam: 10/07/2022 Medical Rec #:  [id number]        Height:       66.0 in Accession #:    [id number]       Weight:       214.6 lb Date of Birth:  01/20/1946         BSA:          2.061 m Patient Age:    76 years         BP:           114/68 mmHg Patient Gender: M                HR:           93 bpm. [facility] Procedure: 2D Echo, Cardiac Doppler, Color Doppler and Intracardiac  Opacification Agent Indications:    Congestive Heart Failure I50.9  History:        Patient has prior history of Echocardiogram examinations, most                 recent 04/14/2019. Signs/Symptoms:Hypotension and Shortness of                 Breath; Risk Factors:Hypertension, Former Smoker and Sleep                 Apnea.  Sonographer:    Aron Baba Referring Phys: [phone] CARLOS MADERA  Sonographer Comments: Image acquisition challenging due to patient body habitus. IMPRESSIONS  1. Left  ventricular ejection fraction, by estimation, is <20%. The left ventricle has severely decreased function. The left ventricle demonstrates global hypokinesis with septal dyssynergy. The left ventricular internal cavity size was mildly to moderately dilated. There is mild left ventricular hypertrophy. Left ventricular diastolic parameters are indeterminate.  2. Definity contrast shows loosely organized thrombotic material and slow flow in the apical inferolateral distribution, but no formed thrombus.  3. Right ventricular systolic function is mildly reduced. The right ventricular size is normal. There is moderately elevated pulmonary artery systolic pressure. The estimated right ventricular systolic pressure is 44.0 mmHg.  4. Left atrial size was severely dilated.  5. The mitral valve is degenerative. Mild mitral valve regurgitation.  6. The aortic valve was not well visualized. There is mild calcification of the aortic valve. Aortic valve regurgitation is not visualized.  7. Aortic dilatation noted. There is mild dilatation of the aortic root, measuring 41 mm. There is moderate dilatation of the ascending aorta, measuring 45 mm.  8. The inferior vena cava is dilated in size with >50% respiratory variability, suggesting right atrial pressure of 8 mmHg. Comparison(s): Prior images reviewed side by side. LVEF has decreased substantially compared with prior study, now <20%. FINDINGS  Left Ventricle: Left ventricular ejection fraction, by estimation, is <20%. The left ventricle has severely decreased function. The left ventricle demonstrates global hypokinesis. Definity contrast agent was given IV to delineate the left ventricular endocardial borders. The left ventricular internal cavity size was mildly to moderately dilated. There is mild left ventricular hypertrophy. Abnormal (paradoxical) septal motion, consistent with left bundle branch block. Left ventricular diastolic parameters are indeterminate. Right Ventricle:  The right ventricular size is normal. No increase in right ventricular wall thickness. Right ventricular systolic function is mildly reduced. There is moderately elevated pulmonary artery systolic pressure. The tricuspid regurgitant velocity is 3.00 m/s, and with an assumed right atrial pressure of 8 mmHg, the estimated right ventricular systolic pressure is 44.0 mmHg. Left Atrium: Left atrial size was severely dilated. Right Atrium: Right atrial size was normal in size. Pericardium: There is no evidence of pericardial effusion. Mitral Valve: The mitral valve is degenerative in appearance. There is mild thickening of the mitral valve leaflet(s). There is mild calcification of the mitral valve leaflet(s). Mild mitral annular calcification. Mild mitral valve regurgitation. Tricuspid Valve: The tricuspid valve is grossly normal. Tricuspid valve regurgitation is mild. Aortic Valve: The aortic valve was not well visualized. There is mild calcification of the aortic valve. There is mild aortic valve annular calcification. Aortic valve regurgitation is not visualized. Pulmonic Valve: The pulmonic valve was not well visualized. Pulmonic valve regurgitation is not visualized. Aorta: Aortic dilatation noted. There is mild dilatation of the aortic root, measuring 41 mm. There is moderate dilatation of the ascending aorta, measuring 45 mm. Venous: The inferior vena cava  is dilated in size with greater than 50% respiratory variability, suggesting right atrial pressure of 8 mmHg. IAS/Shunts: No atrial level shunt detected by color flow Doppler.  LEFT VENTRICLE PLAX 2D LVIDd:         6.10 cm   Diastology LVIDs:         5.70 cm   LV e' medial:    9.30 cm/s LV PW:         1.10 cm   LV E/e' medial:  18.8 LV IVS:        1.00 cm   LV e' lateral:   14.70 cm/s LVOT diam:     2.00 cm   LV E/e' lateral: 11.9 LV SV:         50 LV SV Index:   24 LVOT Area:     3.14 cm  RIGHT VENTRICLE RV S prime:     9.39 cm/s TAPSE (M-mode): 1.9 cm LEFT  ATRIUM              Index        RIGHT ATRIUM           Index LA diam:        4.60 cm  2.23 cm/m   RA Area:     23.30 cm LA Vol (A2C):   113.0 ml 54.84 ml/m  RA Volume:   60.90 ml  29.55 ml/m LA Vol (A4C):   109.0 ml 52.90 ml/m LA Biplane Vol: 111.0 ml 53.87 ml/m  AORTIC VALVE LVOT Vmax:   96.00 cm/s LVOT Vmean:  59.200 cm/s LVOT VTI:    0.159 m  AORTA Ao Root diam: 4.00 cm Ao Asc diam:  4.50 cm MITRAL VALVE                TRICUSPID VALVE MV Area (PHT): 8.07 cm     TR Peak grad:   36.0 mmHg MV Decel Time: 94 msec      TR Vmax:        300.00 cm/s MV E velocity: 175.00 cm/s                             SHUNTS                             Systemic VTI:  0.16 m                             Systemic Diam: 2.00 cm Nona Dell MD Electronically signed by Nona Dell MD Signature Date/Time: 10/07/2022/2:57:01 PM    Final    DG Chest Port 1 View  Result Date: 10/06/2022 CLINICAL DATA:  Shortness of breath. EXAM: PORTABLE CHEST 1 VIEW COMPARISON:  Chest radiograph dated 09/27/2022 and CT dated 09/27/2022. FINDINGS: No focal consolidation, pleural effusion, or pneumothorax. Stable mild cardiomegaly. No acute osseous pathology. Bilateral shoulder hemi arthroplasties. IMPRESSION: No active disease. Electronically Signed   By: Elgie Collard M.D.   On: 10/06/2022 19:25   CT Angio Chest PE W and/or Wo Contrast  Result Date: 09/27/2022 CLINICAL DATA:  Pulmonary embolism suspected, low to intermediate probability, positive D-dimer. Increased shortness of breath. EXAM: CT ANGIOGRAPHY CHEST WITH CONTRAST TECHNIQUE: Multidetector CT imaging of the chest was performed using the standard protocol during bolus administration of intravenous contrast. Multiplanar CT image reconstructions and MIPs were obtained to evaluate the vascular  anatomy. RADIATION DOSE REDUCTION: This exam was performed according to the departmental dose-optimization program which includes automated exposure control, adjustment of the mA and/or kV  according to patient size and/or use of iterative reconstruction technique. CONTRAST:  75mL OMNIPAQUE IOHEXOL 350 MG/ML SOLN COMPARISON:  08/31/2022. FINDINGS: Cardiovascular: Heart is enlarged and there is no pericardial effusion. Multi-vessel coronary artery calcifications are noted. There is mild atherosclerotic calcification of the aorta with aneurysmal dilatation of the ascending aorta measuring 4.5 cm. The pulmonary trunk is distended suggesting underlying pulmonary artery hypertension. No evidence of pulmonary embolism. Mediastinum/Nodes: Nonspecific prominent lymph nodes are noted in the mediastinum measuring up to 1 cm. There are enlarged lymph nodes at the right hilum measuring 1.4 cm. No axillary lymphadenopathy is seen. There is asymmetric enlargement of the left lobe of the thyroid gland with a nodule extending into the superior mediastinum on the left, unchanged. The trachea and esophagus are within normal limits. Lungs/Pleura: Interlobular septal thickening and ground-glass opacities are noted bilaterally. No effusion or pneumothorax. Upper Abdomen: Stones are present within the gallbladder. Left kidney is surgically absent. No acute abnormality. Musculoskeletal: Fixation hardware is noted at the shoulders bilaterally. The bony structures are stable. Review of the MIP images confirms the above findings. IMPRESSION: 1. No evidence of pulmonary embolism. 2. Interlobular septal thickening with ground-glass opacities bilaterally suggesting pulmonary edema, less likely infection. 3. 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 Ultrasound is recommended for further characterization on nonemergent follow-up. 4. Aortic atherosclerosis with aneurysmal dilatation of the ascending aorta measuring 4.5 cm. Ascending thoracic aortic aneurysm. Recommend semi-annual imaging followup by CTA or MRA and referral to cardiothoracic surgery if not already obtained. This recommendation follows 2010  ACCF/AHA/AATS/ACR/ASA/SCA/SCAI/SIR/STS/SVM Guidelines for the Diagnosis and Management of Patients With Thoracic Aortic Disease. Circulation. 2010; 121: Z610-R604. Aortic aneurysm NOS (ICD10-I71.9) 5. Coronary artery calcifications. 6. Cholelithiasis. Electronically Signed   By: Thornell Sartorius M.D.   On: 09/27/2022 04:44   DG Chest Port 1 View  Result Date: 09/27/2022 CLINICAL DATA:  Shortness of breath EXAM: PORTABLE CHEST 1 VIEW COMPARISON:  09/12/2022 FINDINGS: Cardiac shadow is enlarged. Lungs are well aerated bilaterally. Mild interstitial edema is noted. No focal infiltrate is seen. Bilateral shoulder replacements are noted. IMPRESSION: Changes of mild interstitial edema. No other focal abnormality is seen. Electronically Signed   By: Alcide Clever M.D.   On: 09/27/2022 03:17    DISCHARGE EXAMINATION: Please see progress note from earlier today  DISPOSITION: Home  Discharge Instructions     (HEART FAILURE PATIENTS) Call MD:  Anytime you have any of the following symptoms: 1) 3 pound weight gain in 24 hours or 5 pounds in 1 week 2) shortness of breath, with or without a dry hacking cough 3) swelling in the hands, feet or stomach 4) if you have to sleep on extra pillows at night in order to breathe.   Complete by: As directed    Call MD for:  difficulty breathing, headache or visual disturbances   Complete by: As directed    Call MD for:  extreme fatigue   Complete by: As directed    Call MD for:  persistant dizziness or light-headedness   Complete by: As directed    Call MD for:  persistant nausea and vomiting   Complete by: As directed    Call MD for:  severe uncontrolled pain   Complete by: As directed    Call MD for:  temperature >100.4   Complete by: As directed    Diet -  low sodium heart healthy   Complete by: As directed    Discharge instructions   Complete by: As directed    Take your medications as prescribed.  Please be sure to follow-up with Dr. Berneice Heinrich to discuss the  finding in your right kidney.  You were cared for by a hospitalist during your hospital stay. If you have any questions about your discharge medications or the care you received while you were in the hospital after you are discharged, you can call the unit and asked to speak with the hospitalist on call if the hospitalist that took care of you is not available. Once you are discharged, your primary care physician will handle any further medical issues. Please note that NO REFILLS for any discharge medications will be authorized once you are discharged, as it is imperative that you return to your primary care physician (or establish a relationship with a primary care physician if you do not have one) for your aftercare needs so that they can reassess your need for medications and monitor your lab values. If you do not have a primary care physician, you can call [phone] for a physician referral.   Increase activity slowly   Complete by: As directed           Allergies as of 10/13/2022   No Known Allergies      Medication List     STOP taking these medications    lisinopril 5 MG tablet Commonly known as: ZESTRIL       TAKE these medications    benzonatate 100 MG capsule Commonly known as: TESSALON Take 1 capsule (100 mg total) by mouth 3 (three) times daily as needed for cough.   fluticasone 50 MCG/ACT nasal spray Commonly known as: FLONASE Place 1 spray into both nostrils daily. Start taking on: Oct 14, 2022   furosemide 20 MG tablet Commonly known as: LASIX Take 1 tablet (20 mg) daily as needed if you gain more than 3 pounds in 1 day or 5 pounds in 1 week. What changed:  how much to take how to take this when to take this additional instructions   levothyroxine 25 MCG tablet Commonly known as: SYNTHROID Take 1 tablet (25 mcg total) by mouth daily before breakfast. Take on an empty stomach with water only.  Wait to consume other foods/ drinks/ meds for 30 minutes.    metoprolol succinate 25 MG 24 hr tablet Commonly known as: TOPROL-XL Take 1 tablet (25 mg total) by mouth daily. Start taking on: Oct 14, 2022   polyvinyl alcohol 1.4 % ophthalmic solution Commonly known as: LIQUIFILM TEARS Place 1 drop into both eyes as needed for dry eyes.          Follow-up Information     [facility]. Go in 19 day(s).   Specialty: Cardiology Why: Hospital follow up 10/29/2022 @ 10 am PLEASE bring a current medication list to appointment FREE valet parking, Entrance C, off [employer] information: [address] [id number] [place] [address] [phone]        Loletta Parish., MD Follow up.   Specialty: Urology Why: post hospitalization follow up to discuss lesion noted in right kidney Contact information: [address] [place] [address] [phone]         Raliegh Ip, DO. Schedule an appointment as soon as possible for a visit in 1 week(s).   Specialty: Family Medicine Contact information: [phone]  [address] [place] [address] [phone]                 TOTAL DISCHARGE TIME: 35 mins  Han Lysne Rito Ehrlich  [facility] Pager on [email or web address]  10/13/2022, 11:07 AM

## 2022-10-13 NOTE — Plan of Care (Signed)
  Problem: Education: Goal: Knowledge of General Education information will improve Description Including pain rating scale, medication(s)/side effects and non-pharmacologic comfort measures Outcome: Progressing   

## 2022-10-13 NOTE — Progress Notes (Addendum)
Rounding Note    Patient Name: Billy Rasmussen Date of Encounter: 10/13/2022  [facility] Cardiologist: Nicki Guadalajara, MD   Subjective   Patient reports intermittent/brief episodes of dyspnea. Overall feels like he's improving. Denies chest pain or palpitations.  Inpatient Medications    Scheduled Meds:  enoxaparin (LOVENOX) injection  40 mg Subcutaneous Q24H   fluticasone  1 spray Each Nare Daily   levothyroxine  25 mcg Oral Q0600   metoprolol succinate  25 mg Oral Daily   sodium chloride flush  3 mL Intravenous Q12H   Continuous Infusions:  sodium chloride 10 mL/hr at 10/08/22 0859   sodium chloride     PRN Meds: sodium chloride, sodium chloride, acetaminophen **OR** acetaminophen, acetaminophen, albuterol, benzonatate, guaiFENesin, loratadine, ondansetron **OR** ondansetron (ZOFRAN) IV, sodium chloride flush   Vital Signs    Vitals:   10/12/22 1331 10/12/22 1426 10/12/22 2014 10/13/22 0500  BP:  122/80 120/73 127/80  Pulse: 80 83 84 76  Resp:  18 18 18   Temp:  98.6 F (37 C) 98.6 F (37 C) 97.6 F (36.4 C)  TempSrc:  Oral Oral Oral  SpO2:  97% 95%   Weight:      Height:        Intake/Output Summary (Last 24 hours) at 10/13/2022 0749 Last data filed at 10/13/2022 1610 Gross per 24 hour  Intake --  Output 2050 ml  Net -2050 ml      10/07/2022    8:00 PM 10/07/2022   12:56 AM 10/06/2022    6:38 PM  Last 3 Weights  Weight (lbs) 211 lb 214 lb 9.6 oz 215 lb 12.8 oz  Weight (kg) 95.709 kg 97.342 kg 97.886 kg      Telemetry    Sinus rhythm with first degree AV block - Personally Reviewed  ECG    No new tracing - Personally Reviewed  Physical Exam   GEN: No acute distress.   Neck: No JVD Cardiac: RRR, no murmurs, rubs, or gallops.  Respiratory: Clear to auscultation bilaterally. GI: Soft, nontender, non-distended  MS: No edema; No deformity. Neuro:  Nonfocal  Psych: Normal affect   Labs    High Sensitivity Troponin:   Recent Labs   Lab 09/27/22 0302 09/27/22 0550 10/06/22 1915 10/06/22 2102  TROPONINIHS 27* 25* 31* 28*     Chemistry Recent Labs  Lab 10/06/22 1915 10/07/22 0413 10/07/22 1107 10/10/22 0214 10/11/22 0703 10/12/22 0252  NA 127* 132*   < > 134* 130* 130*  K 3.9 4.0   < > 4.2 3.9 4.4  CL 94* 100   < > 100 94* 99  CO2 24 25   < > 24 24 24   GLUCOSE 139* 95   < > 100* 105* 96  BUN 31* 27*   < > 21 28* 31*  CREATININE 1.65* 1.47*   < > 1.43* 1.54* 1.80*  CALCIUM 9.1 9.2   < > 9.8 9.7 9.5  MG  --  2.1  --   --  2.0  --   PROT 7.4 6.9  --   --   --   --   ALBUMIN 3.9 3.5  --   --   --   --   AST 28 23  --   --   --   --   ALT 17 13  --   --   --   --   ALKPHOS 43 38  --   --   --   --  BILITOT 1.5* 1.2  --   --   --   --   GFRNONAA 43* 50*   < > 51* 47* 39*  ANIONGAP 9 7   < > 10 12 7    < > = values in this interval not displayed.    Lipids No results for input(s): "CHOL", "TRIG", "HDL", "LABVLDL", "LDLCALC", "CHOLHDL" in the last 168 hours.  Hematology Recent Labs  Lab 10/07/22 0413 10/09/22 1623 10/09/22 1628 10/10/22 0214 10/12/22 0252  WBC 8.8  --   --  6.4 7.0  RBC 4.16*  --   --  4.61 5.00  HGB 12.7*   < > 13.6 13.7 14.9  HCT 38.7*   < > 40.0 41.0 44.6  MCV 93.0  --   --  88.9 89.2  MCH 30.5  --   --  29.7 29.8  MCHC 32.8  --   --  33.4 33.4  RDW 14.3  --   --  13.8 13.6  PLT 111*  --   --  154 159   < > = values in this interval not displayed.   Thyroid No results for input(s): "TSH", "FREET4" in the last 168 hours.  BNP Recent Labs  Lab 10/11/22 1020  BNP 183.9*    DDimer  Recent Labs  Lab 10/06/22 1915  DDIMER 0.47     Radiology    CT RENAL STONE STUDY  Addendum Date: 10/12/2022   ADDENDUM REPORT: 10/12/2022 11:41 ADDENDUM: Following should be added to the impression. There is 8 mm exophytic lesion in the lower pole of right kidney. This finding was not seen in the previous examination. This may suggest a hemorrhagic cyst or new solid nodule. Follow-up  renal sonogram may be considered. These results will be called to the ordering clinician or representative by the Radiologist Assistant, and communication documented in the PACS or [employer]. Electronically Signed   By: Ernie Avena M.D.   On: 10/12/2022 11:41   Result Date: 10/12/2022 CLINICAL DATA:  History of left nephrectomy, renal dysfunction EXAM: CT ABDOMEN AND PELVIS WITHOUT CONTRAST TECHNIQUE: Multidetector CT imaging of the abdomen and pelvis was performed following the standard protocol without IV contrast. RADIATION DOSE REDUCTION: This exam was performed according to the departmental dose-optimization program which includes automated exposure control, adjustment of the mA and/or kV according to patient size and/or use of iterative reconstruction technique. COMPARISON:  12/08/2021 FINDINGS: Lower chest: Visualized lower lung fields are clear. Heart is enlarged in size. Scattered coronary artery calcifications are seen. Hepatobiliary: No focal abnormalities are seen in liver. There is no dilation of bile ducts. Multiple calcified gallbladder stones are seen. Pancreas: No focal abnormalities are seen. Spleen: Spleen measures 13.6 cm in maximum diameter. Adrenals/Urinary Tract: Adrenals are unremarkable. Left kidney is not seen consistent with left nephrectomy. Surgical staples are seen in left renal bed. Right kidney shows no hydronephrosis. There are no right renal or right ureteral stones. Ileal conduit is seen in right mid abdomen. Distal course of right ureter is not distinctly visualized. In image 48 of series 6, there is 8 mm exophytic structure in the lower pole of right kidney. This finding was not distinctly seen on the previous examination. Urinary bladder is not seen suggesting previous removal. Stomach/Bowel: Stomach is unremarkable. Small bowel loops are not dilated. Appendix is not seen. Surgical staples are seen in the wall of the terminal ileum. There is no pericecal  stranding. Scattered diverticula seen in colon. There is no evidence of focal  acute diverticulitis. Vascular/Lymphatic: Scattered arterial calcifications are seen in aorta and its major branches. Reproductive: Prostate is not seen and may have been removed. Other: There is a ventral hernia containing fat medial to the ileal conduit. Right inguinal hernia containing fat and short segment of small bowel is seen. There is no evidence of incarceration or small bowel obstruction. Left inguinal hernia containing fat is seen. Musculoskeletal: Degenerative changes are noted in lumbar spine, most severe at L2-L3 level with no significant interval change. IMPRESSION: There is no evidence of intestinal obstruction or pneumoperitoneum. Status post left nephrectomy. Right kidney shows no hydronephrosis. There are no renal or ureteral stones on the right side. There is urinary diversion with ileal conduit in right mid abdomen. Status post cystectomy and prostatectomy. Scattered diverticula are seen in the colon without signs of focal diverticulitis. Ventral hernia containing fat is seen. Left inguinal hernia containing fat is seen. There is right inguinal hernia containing fat and short segment of small bowel without evidence of incarceration or obstruction. Arteriosclerosis. Coronary artery disease. Gallbladder stones. Diverticulosis of colon. Enlarged spleen. Other findings as described in the body of the report. Electronically Signed: By: Ernie Avena M.D. On: 10/12/2022 11:24    Cardiac Studies   10/07/22 TTE   IMPRESSIONS     1. Left ventricular ejection fraction, by estimation, is <20%. The left  ventricle has severely decreased function. The left ventricle demonstrates  global hypokinesis with septal dyssynergy. The left ventricular internal  cavity size was mildly to  moderately dilated. There is mild left ventricular hypertrophy. Left  ventricular diastolic parameters are indeterminate.   2.  Definity contrast shows loosely organized thrombotic material and slow  flow in the apical inferolateral distribution, but no formed thrombus.   3. Right ventricular systolic function is mildly reduced. The right  ventricular size is normal. There is moderately elevated pulmonary artery  systolic pressure. The estimated right ventricular systolic pressure is  44.0 mmHg.   4. Left atrial size was severely dilated.   5. The mitral valve is degenerative. Mild mitral valve regurgitation.   6. The aortic valve was not well visualized. There is mild calcification  of the aortic valve. Aortic valve regurgitation is not visualized.   7. Aortic dilatation noted. There is mild dilatation of the aortic root,  measuring 41 mm. There is moderate dilatation of the ascending aorta,  measuring 45 mm.   8. The inferior vena cava is dilated in size with >50% respiratory  variability, suggesting right atrial pressure of 8 mmHg.   Comparison(s): Prior images reviewed side by side. LVEF has decreased  substantially compared with prior study, now <20%.   FINDINGS   Left Ventricle: Left ventricular ejection fraction, by estimation, is  <20%. The left ventricle has severely decreased function. The left  ventricle demonstrates global hypokinesis. Definity contrast agent was  given IV to delineate the left ventricular  endocardial borders. The left ventricular internal cavity size was mildly  to moderately dilated. There is mild left ventricular hypertrophy.  Abnormal (paradoxical) septal motion, consistent with left bundle branch  block. Left ventricular diastolic  parameters are indeterminate.   Right Ventricle: The right ventricular size is normal. No increase in  right ventricular wall thickness. Right ventricular systolic function is  mildly reduced. There is moderately elevated pulmonary artery systolic  pressure. The tricuspid regurgitant  velocity is 3.00 m/s, and with an assumed right atrial  pressure of 8 mmHg,  the estimated right ventricular systolic pressure is 44.0 mmHg.  Left Atrium: Left atrial size was severely dilated.   Right Atrium: Right atrial size was normal in size.   Pericardium: There is no evidence of pericardial effusion.   Mitral Valve: The mitral valve is degenerative in appearance. There is  mild thickening of the mitral valve leaflet(s). There is mild  calcification of the mitral valve leaflet(s). Mild mitral annular  calcification. Mild mitral valve regurgitation.   Tricuspid Valve: The tricuspid valve is grossly normal. Tricuspid valve  regurgitation is mild.   Aortic Valve: The aortic valve was not well visualized. There is mild  calcification of the aortic valve. There is mild aortic valve annular  calcification. Aortic valve regurgitation is not visualized.   Pulmonic Valve: The pulmonic valve was not well visualized. Pulmonic valve  regurgitation is not visualized.   Aorta: Aortic dilatation noted. There is mild dilatation of the aortic  root, measuring 41 mm. There is moderate dilatation of the ascending  aorta, measuring 45 mm.   Venous: The inferior vena cava is dilated in size with greater than 50%  respiratory variability, suggesting right atrial pressure of 8 mmHg.   IAS/Shunts: No atrial level shunt detected by color flow Doppler.      10/09/22 LHC/RHC     Prox RCA lesion is 30% stenosed.   1.  Mild diffuse coronary artery disease. 2.  Fick cardiac output of 7.89 L/min and Fick cardiac index of 3.85 L/min/m with the following hemodynamics:            Right atrial pressure mean of 6 mmHg            Right ventricular pressure 50/3 with a end-diastolic pressure of 6 mmHg            Wedge pressure mean of 27 mmHg            Pulmonary artery pressure 55/26 with a mean of 37 mmHg            Pulmonary vascular resistance of 1.84 Woods units consistent with pulmonary venous hypertension            Pulmonary artery pulsatility  index of 4.1 3.  LVEDP assessment was deferred due to the presence of left ventricular thrombus.   Diagnostic Dominance: Right     Patient Profile     77 y.o. male with history of bladder cancer s/p left nephrectomy, cystoprostatectomy, fibromyalgia, hypertension, OSA not on CPAP, and ascending aortic aneurysm of 4.5 cm, and psoriasis who initially presented to clinic to see Dr. Bjorn Pippin for ER follow-up for concern for HF where he was noted to be febrile with hypotension prompting referral to ER. In the ER, TTE notable for LVEF 20% with global hypokinesis and septal dyssynergy, mild RV dysffunction, moderate PASP, mild MR, RAP 8. Cardiology is now consulted for management of systolic HF.   Assessment & Plan    Acute Combined CHF   Patient admitted with symptoms of worsening dyspnea over the past few months. TTE this admission showed LVEF 20% with septal dyssynergy. Slow flow but without formed thrombus on this TTE.LHC/RHC with mild diffuse CAD. Fick CO 7.89L/min and Fick CI 3.85. Right atrial pressure 6 mmHg, wedge pressure .    Patient net negative 9.2 L this admission, lasix held the last 3 days with increased creatinine 1.43->1.54. Creatinine mildly improved this morning Patient did start Spironolactone on 5/11 but since held 2/2 rising creatinine.  No ACEi/ARB/ARNI at this time with current creatinine trend No SGLT2 with recurrent UTI Despite  decreased LVEF, patient with stable CO/CI on RHC. Continue metoprolol succinate 25mg  as initiated yesterday   Ascending aortic aneurysm   CTA chest from 09/27/22 showed 45mm ascending aortic aneurysm. Will need serial imaging in outpatient setting.   Hypertension   BP currently at goal.   Solitary kidney, s/p left nephrectomy CKD IIIa   Patient with AKI on CKD. Creatinine has risen to 1.8 today. Continue to follow closely.   Per primary team:   Fever Patient with fever prior to admission. Has completed abx therapy per primary  team. Chronic thrombocytopenia Hypothyroidism     [facility] will sign off.   Medication Recommendations: Toprol-XL 25 mg daily.  Recommend monitoring daily weights and take Lasix 20 mg daily as needed if gains more than 3 pounds in 1 day or 5 pounds in 1 week Other recommendations (labs, testing, etc): None Follow up as an outpatient: Scheduled for 5/24   For questions or updates, please contact [facility] Please consult [email or web address] for contact info under     Signed, Perlie Gold, PA-C  10/13/2022, 7:49 AM     Patient seen and examined.  Agree with above documentation.  On exam, patient is alert and oriented, regular rate and rhythm, no murmurs, lungs CTAB, no LE edema or JVD.  Renal function mildly improved today.  Okay for discharge from cardiac standpoint.  Would continue Toprol-XL 25 mg daily.  Will discharge on as needed Lasix, would asked to monitor daily weights and can take Lasix 20 mg if gains more than 3 pounds in 1 day or 5 pounds 1 week.  Follow-up appointment scheduled for 5/24.  Little Ishikawa, MD

## 2022-10-13 NOTE — TOC Transition Note (Signed)
Transition of Care [facility]) - CM/SW Discharge Note   Patient Details  Name: Billy Rasmussen MRN: [id number] Date of Birth: 1946/02/11  Transition of Care [facility]) CM/SW Contact:  Ronny Bacon, RN Phone Number: 10/13/2022, 12:00 PM   Clinical Narrative: Patient presents with complaint of shortness of breath. Spoke with patient at bedside. Patient lives at home with spouse and mother in law. Patient has cane at home and an "old walker somewhere in storage." Patient does not participate in Home Health currently and no DME needs noted at this time. Plan for discharge to home today. No further TOC needs noted at this time.     Final next level of care: Home/Self Care Barriers to Discharge: No Barriers Identified   Patient Goals and CMS Choice   Choice offered to / list presented to : NA  Discharge Placement                         Discharge Plan and Services Additional resources added to the After Visit Summary for                    DME Agency: NA       HH Arranged: NA          Social Determinants of Health (SDOH) Interventions SDOH Screenings   Food Insecurity: No Food Insecurity (10/07/2022)  Housing: Low Risk  (10/09/2022)  Transportation Needs: No Transportation Needs (10/07/2022)  Utilities: Not At Risk (10/07/2022)  Alcohol Screen: Low Risk  (08/14/2022)  Depression (PHQ2-9): Low Risk  (09/16/2022)  Financial Resource Strain: Low Risk  (10/09/2022)  Physical Activity: Inactive (08/14/2022)  Social Connections: Moderately Integrated (08/14/2022)  Stress: No Stress Concern Present (08/14/2022)  Tobacco Use: Medium Risk (10/12/2022)     Readmission Risk Interventions     No data to display

## 2022-10-13 NOTE — Progress Notes (Signed)
[facility] PROGRESS NOTE   Billy Rasmussen:[id number] DOB: [birth year] DOA: 10/06/2022  PCP: Raliegh Ip, DO  Brief History/Interval Summary: 77 y.o. male with medical history significant of hypertension, hypothyroidism, history of bladder cancer status post left nephrectomy, cystoprostatectomy, ascending aortic aneurysm who presented to the emergency department due to 3-week onset of shortness of breath and generalized weakness.  He was also complaining of fever.  Patient has a urostomy.  UA suggesting infection.  He was hospitalized for further management.  Echocardiogram subsequently showed significantly lower left Triklo EF of 20%.  He was transferred over to [facility] for further cardiac workup.    Consultants: Cardiology  Procedures:  Transthoracic echocardiogram  Right and left heart catheterization 1.  Mild diffuse coronary artery disease. 2.  Fick cardiac output of 7.89 L/min and Fick cardiac index of 3.85 L/min/m with the following hemodynamics:            Right atrial pressure mean of 6 mmHg            Right ventricular pressure 50/3 with a end-diastolic pressure of 6 mmHg            Wedge pressure mean of 27 mmHg            Pulmonary artery pressure 55/26 with a mean of 37 mmHg            Pulmonary vascular resistance of 1.84 Woods units consistent with pulmonary venous hypertension            Pulmonary artery pulsatility index of 4.1 3.  LVEDP assessment was deferred due to the presence of left ventricular thrombus.   Subjective/Interval History: Patient denies any complaints.  Occasional shortness of breath but overall he seems to be doing well.  Denies any chest pain.  No nausea or vomiting.  He was told about the findings on the CT scan.  Assessment/Plan:  Acute systolic CHF Echocardiogram shows EF of 20% global hypokinesis noted.  Organized thrombotic material without any formed thrombus was also noted. Seen by cardiology.  Transferred  to [facility] for further workup. Patient underwent cardiac catheterization on 5/10.  Mild CAD was noted. Patient given a dose of furosemide on 5/11.  Patient diuresed well but creatinine went up.  Diuretics on hold.  Cannot be given ACE inhibitor or ARB due to elevated creatinine. Stable for the most part.  Renal function appears to have improved this morning.  Await input from cardiology.  Urinary tract infection in the setting of urostomy/previous history of bladder cancer and nephrectomy Patient has lone kidney.  UA was abnormal.  Patient started on ciprofloxacin.  ESBL infection in 2021.   It does not appear that urine culture was ever sent.  Patient has completed course of ciprofloxacin.  Acute on chronic kidney disease stage IIIa/lone kidney Patient is status post left nephrectomy in the past.  Baseline creatinine 1.3- 1.5. Creatinine increased likely due to CHF as well as diuretics.  Diuretics placed on hold. Renal function noted to be slightly better today.  Monitor urine output. CT of the renal system suggested small lesion in the right kidney.  No obstruction was noted no other acute findings were noted. Patient to discuss this further with his urologist, Dr. Kathrynn Running.  Essential hypertension All medications currently on hold including his diuretics and lisinopril.  Blood pressure is reasonably well-controlled.  Hyponatremia Initially came in with sodium level of 127 which was thought to be due to hypovolemia.  He  was hydrated with improvement in sodium levels.  Chronic thrombocytopenia Stable.  Hypothyroidism Continue levothyroxine.  Ascending aortic aneurysm Will need outpatient monitoring  Incidental findings on CT scan Bilateral inguinal hernia was noted.  Cholelithiasis and diverticulosis was noted.  Enlarged spleen was also noted.  These issues can be addressed in the outpatient setting.  Obesity Estimated body mass index is 34.06 kg/m as calculated from the  following:   Height as of this encounter: 5\' 6"  (1.676 m).   Weight as of this encounter: 95.7 kg.   DVT Prophylaxis: Lovenox Code Status: Full code Family Communication: Discussed with the patient Disposition Plan: Hopefully home when improved    Medications: Scheduled:  enoxaparin (LOVENOX) injection  40 mg Subcutaneous Q24H   fluticasone  1 spray Each Nare Daily   levothyroxine  25 mcg Oral Q0600   metoprolol succinate  25 mg Oral Daily   sodium chloride flush  3 mL Intravenous Q12H   Continuous:  sodium chloride 10 mL/hr at 10/08/22 0859   sodium chloride     AVW:UJWJXB chloride, sodium chloride, acetaminophen **OR** acetaminophen, acetaminophen, albuterol, benzonatate, guaiFENesin, loratadine, ondansetron **OR** ondansetron (ZOFRAN) IV, sodium chloride flush  Antibiotics: Anti-infectives (From admission, onward)    Start     Dose/Rate Route Frequency Ordered Stop   10/10/22 1030  ciprofloxacin (CIPRO) tablet 500 mg        500 mg Oral 2 times daily 10/10/22 0934 10/11/22 2042   10/07/22 0800  ciprofloxacin (CIPRO) IVPB 400 mg  Status:  Discontinued        400 mg 200 mL/hr over 60 Minutes Intravenous Every 12 hours 10/07/22 0211 10/10/22 0934   10/06/22 2130  cefTRIAXone (ROCEPHIN) 1 g in sodium chloride 0.9 % 100 mL IVPB        1 g 200 mL/hr over 30 Minutes Intravenous  Once 10/06/22 2128 10/06/22 2237       Objective:  Vital Signs  Vitals:   10/12/22 1426 10/12/22 2014 10/13/22 0500 10/13/22 0856  BP: 122/80 120/73 127/80 120/79  Pulse: 83 84 76 69  Resp: 18 18 18 19   Temp: 98.6 F (37 C) 98.6 F (37 C) 97.6 F (36.4 C) 98.2 F (36.8 C)  TempSrc: Oral Oral Oral Oral  SpO2: 97% 95%  99%  Weight:      Height:        Intake/Output Summary (Last 24 hours) at 10/13/2022 1047 Last data filed at 10/13/2022 1478 Gross per 24 hour  Intake --  Output 2050 ml  Net -2050 ml    Filed Weights   10/06/22 1838 10/07/22 0056 10/07/22 2000  Weight: 97.9 kg 97.3  kg 95.7 kg    General appearance: Awake alert.  In no distress Resp: Clear to auscultation bilaterally.  Normal effort Cardio: S1-S2 is normal regular.  No S3-S4.  No rubs murmurs or bruit GI: Abdomen is soft.  Nontender nondistended.  Bowel sounds are present normal.  No masses organomegaly.  Urostomy noted. Extremities: No edema.  Full range of motion of lower extremities. Neurologic: Alert and oriented x3.  No focal neurological deficits.    Lab Results:  Data Reviewed: I have personally reviewed following labs and reports of the imaging studies  CBC: Recent Labs  Lab 10/06/22 1915 10/07/22 0413 10/09/22 1623 10/09/22 1625 10/09/22 1628 10/10/22 0214 10/12/22 0252  WBC 10.4 8.8  --   --   --  6.4 7.0  NEUTROABS 8.5*  --   --   --   --   --   --  HGB 12.8* 12.7* 12.9* 13.6 13.6 13.7 14.9  HCT 37.5* 38.7* 38.0* 40.0 40.0 41.0 44.6  MCV 90.6 93.0  --   --   --  88.9 89.2  PLT 109* 111*  --   --   --  154 159     Basic Metabolic Panel: Recent Labs  Lab 10/07/22 0413 10/07/22 1107 10/09/22 1112 10/09/22 1623 10/09/22 1628 10/10/22 0214 10/11/22 0703 10/12/22 0252 10/13/22 0825  NA 132*   < > 133*   < > 138 134* 130* 130* 135  K 4.0   < > 4.2   < > 4.3 4.2 3.9 4.4 3.8  CL 100   < > 100  --   --  100 94* 99 100  CO2 25   < > 24  --   --  24 24 24 26   GLUCOSE 95   < > 101*  --   --  100* 105* 96 120*  BUN 27*   < > 20  --   --  21 28* 31* 31*  CREATININE 1.47*   < > 1.42*  --   --  1.43* 1.54* 1.80* 1.70*  CALCIUM 9.2   < > 9.6  --   --  9.8 9.7 9.5 9.9  MG 2.1  --   --   --   --   --  2.0  --   --   PHOS 2.0*  --   --   --   --   --   --   --   --    < > = values in this interval not displayed.     GFR: Estimated Creatinine Clearance: 41.3 mL/min (A) (by C-G formula based on SCr of 1.7 mg/dL (H)).  Liver Function Tests: Recent Labs  Lab 10/06/22 1915 10/07/22 0413  AST 28 23  ALT 17 13  ALKPHOS 43 38  BILITOT 1.5* 1.2  PROT 7.4 6.9  ALBUMIN 3.9  3.5       Recent Results (from the past 240 hour(s))  SARS Coronavirus 2 by RT PCR (hospital order, performed in [facility] hospital lab) *cepheid single result test* Anterior Nasal Swab     Status: None   Collection Time: 10/06/22  6:43 PM   Specimen: Anterior Nasal Swab  Result Value Ref Range Status   SARS Coronavirus 2 by RT PCR NEGATIVE NEGATIVE Final    Comment: (NOTE) SARS-CoV-2 target nucleic acids are NOT DETECTED.  The SARS-CoV-2 RNA is generally detectable in upper and lower respiratory specimens during the acute phase of infection. The lowest concentration of SARS-CoV-2 viral copies this assay can detect is 250 copies / mL. A negative result does not preclude SARS-CoV-2 infection and should not be used as the sole basis for treatment or other patient management decisions.  A negative result may occur with improper specimen collection / handling, submission of specimen other than nasopharyngeal swab, presence of viral mutation(s) within the areas targeted by this assay, and inadequate number of viral copies (<250 copies / mL). A negative result must be combined with clinical observations, patient history, and epidemiological information.  Fact Sheet for Patients:   RoadLapTop.co.za  Fact Sheet for Healthcare Providers: http://kim-miller.com/  This test is not yet approved or  cleared by the [place] FDA and has been authorized for detection and/or diagnosis of SARS-CoV-2 by FDA under an Emergency Use Authorization (EUA).  This EUA will remain in effect (meaning this test can be used) for the duration  of the COVID-19 declaration under Section 564(b)(1) of the Act, 21 U.S.C. section 360bbb-3(b)(1), unless the authorization is terminated or revoked sooner.  Performed at [facility], [address], [place], [place] [address]       Radiology Studies: CT RENAL STONE STUDY  Addendum Date: 10/12/2022   ADDENDUM  REPORT: 10/12/2022 11:41 ADDENDUM: Following should be added to the impression. There is 8 mm exophytic lesion in the lower pole of right kidney. This finding was not seen in the previous examination. This may suggest a hemorrhagic cyst or new solid nodule. Follow-up renal sonogram may be considered. These results will be called to the ordering clinician or representative by the Radiologist Assistant, and communication documented in the PACS or [employer]. Electronically Signed   By: Ernie Avena M.D.   On: 10/12/2022 11:41   Result Date: 10/12/2022 CLINICAL DATA:  History of left nephrectomy, renal dysfunction EXAM: CT ABDOMEN AND PELVIS WITHOUT CONTRAST TECHNIQUE: Multidetector CT imaging of the abdomen and pelvis was performed following the standard protocol without IV contrast. RADIATION DOSE REDUCTION: This exam was performed according to the departmental dose-optimization program which includes automated exposure control, adjustment of the mA and/or kV according to patient size and/or use of iterative reconstruction technique. COMPARISON:  12/08/2021 FINDINGS: Lower chest: Visualized lower lung fields are clear. Heart is enlarged in size. Scattered coronary artery calcifications are seen. Hepatobiliary: No focal abnormalities are seen in liver. There is no dilation of bile ducts. Multiple calcified gallbladder stones are seen. Pancreas: No focal abnormalities are seen. Spleen: Spleen measures 13.6 cm in maximum diameter. Adrenals/Urinary Tract: Adrenals are unremarkable. Left kidney is not seen consistent with left nephrectomy. Surgical staples are seen in left renal bed. Right kidney shows no hydronephrosis. There are no right renal or right ureteral stones. Ileal conduit is seen in right mid abdomen. Distal course of right ureter is not distinctly visualized. In image 48 of series 6, there is 8 mm exophytic structure in the lower pole of right kidney. This finding was not distinctly seen on  the previous examination. Urinary bladder is not seen suggesting previous removal. Stomach/Bowel: Stomach is unremarkable. Small bowel loops are not dilated. Appendix is not seen. Surgical staples are seen in the wall of the terminal ileum. There is no pericecal stranding. Scattered diverticula seen in colon. There is no evidence of focal acute diverticulitis. Vascular/Lymphatic: Scattered arterial calcifications are seen in aorta and its major branches. Reproductive: Prostate is not seen and may have been removed. Other: There is a ventral hernia containing fat medial to the ileal conduit. Right inguinal hernia containing fat and short segment of small bowel is seen. There is no evidence of incarceration or small bowel obstruction. Left inguinal hernia containing fat is seen. Musculoskeletal: Degenerative changes are noted in lumbar spine, most severe at L2-L3 level with no significant interval change. IMPRESSION: There is no evidence of intestinal obstruction or pneumoperitoneum. Status post left nephrectomy. Right kidney shows no hydronephrosis. There are no renal or ureteral stones on the right side. There is urinary diversion with ileal conduit in right mid abdomen. Status post cystectomy and prostatectomy. Scattered diverticula are seen in the colon without signs of focal diverticulitis. Ventral hernia containing fat is seen. Left inguinal hernia containing fat is seen. There is right inguinal hernia containing fat and short segment of small bowel without evidence of incarceration or obstruction. Arteriosclerosis. Coronary artery disease. Gallbladder stones. Diverticulosis of colon. Enlarged spleen. Other findings as described in the body of the report.  Electronically Signed: By: Ernie Avena M.D. On: 10/12/2022 11:24       LOS: 5 days   Margaretta Chittum Rito Ehrlich  [facility] Pager on [email or web address]  10/13/2022, 10:47 AM

## 2022-10-15 ENCOUNTER — Telehealth: Payer: Self-pay

## 2022-10-15 NOTE — Telephone Encounter (Signed)
3rd attempt at getting a report of pt's pulse Ox results, message had to be sent again to the local branch, asked for this to be sent to a manager this time.

## 2022-10-15 NOTE — Transitions of Care (Post Inpatient/ED Visit) (Signed)
10/15/2022  Name: Billy Rasmussen MRN: [id number] DOB: 1945/10/31  Today's TOC FU Call Status: Today's TOC FU Call Status:: Successful TOC FU Call Competed TOC FU Call Complete Date: 10/15/22  Transition Care Management Follow-up Telephone Call Date of Discharge: 10/13/22 Discharge Facility: [facility]) Type of Discharge: Inpatient Admission Primary Inpatient Discharge Diagnosis:: Congestive Heart Failure How have you been since you were released from the hospital?: Better Any questions or concerns?: No  Items Reviewed: Did you receive and understand the discharge instructions provided?: Yes Medications obtained,verified, and reconciled?: Yes (Medications Reviewed) Any new allergies since your discharge?: No Dietary orders reviewed?: Yes Type of Diet Ordered:: Low sodium Do you have support at home?: Yes People in Home: spouse Name of Support/Comfort Primary Source: Peggy  Medications Reviewed Today: Medications Reviewed Today     Reviewed by Jodelle Gross, RN (Case Manager) on 10/15/22 at 1334  Med List Status: <None>   Medication Order Taking? Sig Documenting Provider Last Dose Status Informant  benzonatate (TESSALON) 100 MG capsule [id number] Yes Take 1 capsule (100 mg total) by mouth 3 (three) times daily as needed for cough. Osvaldo Shipper, MD Taking Active   fluticasone [facility]) 50 MCG/ACT nasal spray [id number] Yes Place 1 spray into both nostrils daily. Osvaldo Shipper, MD Taking Active   furosemide (LASIX) 20 MG tablet [id number] Yes Take 1 tablet (20 mg) daily as needed if you gain more than 3 pounds in 1 day or 5 pounds in 1 week. Osvaldo Shipper, MD Taking Active   levothyroxine (SYNTHROID) 25 MCG tablet [id number] Yes Take 1 tablet (25 mcg total) by mouth daily before breakfast. Take on an empty stomach with water only.  Wait to consume other foods/ drinks/ meds for 30 minutes. Delynn Flavin M, DO Taking Active Self  metoprolol succinate (TOPROL-XL) 25 MG 24  hr tablet [id number] Yes Take 1 tablet (25 mg total) by mouth daily. Osvaldo Shipper, MD Taking Active   polyvinyl alcohol (LIQUIFILM TEARS) 1.4 % ophthalmic solution [id number] No Place 1 drop into both eyes as needed for dry eyes. Saundra Shelling, MD Unknown Active Self            Home Care and Equipment/Supplies: Were Home Health Services Ordered?: No Any new equipment or medical supplies ordered?: No  Functional Questionnaire: Do you need assistance with bathing/showering or dressing?: No Do you need assistance with meal preparation?: No Do you need assistance with eating?: No Do you have difficulty maintaining continence: No Do you need assistance with getting out of bed/getting out of a chair/moving?: No Do you have difficulty managing or taking your medications?: No  Follow up appointments reviewed: PCP Follow-up appointment confirmed?: Yes Date of PCP follow-up appointment?: 10/20/22 Follow-up Provider: Dr. Nadine Counts Specialist [facility] Follow-up appointment confirmed?: Yes Date of Specialist follow-up appointment?: 10/23/22 Follow-Up Specialty Provider:: Dr. Bjorn Pippin Do you need transportation to your follow-up appointment?: No Do you understand care options if your condition(s) worsen?: Yes-patient verbalized understanding  SDOH Interventions Today    Flowsheet Row Most Recent Value  SDOH Interventions   Food Insecurity Interventions Intervention Not Indicated  Housing Interventions Intervention Not Indicated  Transportation Interventions Intervention Not Indicated  Utilities Interventions Intervention Not Indicated  Financial Strain Interventions Intervention Not Indicated      Interventions Today    Flowsheet Row Most Recent Value  Chronic Disease   Chronic disease during today's visit Congestive Heart Failure (CHF)  General Interventions   General Interventions Discussed/Reviewed General Interventions Discussed  Nutrition Interventions  Nutrition  Discussed/Reviewed Nutrition Discussed, Fluid intake, Decreasing salt        Jodelle Gross, RN, BSN, CCM Care Management Coordinator [facility] Phone: [phone]/Fax: [phone]

## 2022-10-16 ENCOUNTER — Encounter: Payer: Self-pay | Admitting: Family Medicine

## 2022-10-16 NOTE — Telephone Encounter (Signed)
TC to [facility], after receiving Overnight oximetry results and that PCP had signed results, needing to get pt his nighttime oxygen, she is not sure why this has not been processed since these results are what was needed to process the order she will take care of this, she is a Merchandiser, retail w/ [employer].

## 2022-10-16 NOTE — Telephone Encounter (Signed)
LMOVM to pt as to what is going on and what the process is now, if he has not heard from them by Tuesday, no later than Wednesday to call me back.

## 2022-10-19 DIAGNOSIS — N281 Cyst of kidney, acquired: Secondary | ICD-10-CM | POA: Diagnosis not present

## 2022-10-19 DIAGNOSIS — C678 Malignant neoplasm of overlapping sites of bladder: Secondary | ICD-10-CM | POA: Diagnosis not present

## 2022-10-20 ENCOUNTER — Ambulatory Visit
Admission: RE | Admit: 2022-10-20 | Discharge: 2022-10-20 | Disposition: A | Discharge status: Home / Self Care | Payer: Medicare HMO | Source: Ambulatory Visit | Attending: Family Medicine | Admitting: Family Medicine

## 2022-10-20 ENCOUNTER — Other Ambulatory Visit (HOSPITAL_COMMUNITY)
Admission: RE | Admit: 2022-10-20 | Discharge: 2022-10-20 | Disposition: A | Discharge status: Home / Self Care | Payer: Medicare HMO | Source: Ambulatory Visit | Attending: Interventional Radiology | Admitting: Interventional Radiology

## 2022-10-20 ENCOUNTER — Ambulatory Visit (INDEPENDENT_AMBULATORY_CARE_PROVIDER_SITE_OTHER): Payer: Medicare HMO | Admitting: Family Medicine

## 2022-10-20 ENCOUNTER — Encounter: Payer: Self-pay | Admitting: Family Medicine

## 2022-10-20 VITALS — BP 133/83 | HR 81 | Temp 98.6°F | Ht 66.0 in | Wt 202.0 lb

## 2022-10-20 DIAGNOSIS — R161 Splenomegaly, not elsewhere classified: Secondary | ICD-10-CM

## 2022-10-20 DIAGNOSIS — E0789 Other specified disorders of thyroid: Secondary | ICD-10-CM | POA: Diagnosis not present

## 2022-10-20 DIAGNOSIS — Z09 Encounter for follow-up examination after completed treatment for conditions other than malignant neoplasm: Secondary | ICD-10-CM | POA: Diagnosis not present

## 2022-10-20 DIAGNOSIS — N289 Disorder of kidney and ureter, unspecified: Secondary | ICD-10-CM | POA: Diagnosis not present

## 2022-10-20 DIAGNOSIS — Z8551 Personal history of malignant neoplasm of bladder: Secondary | ICD-10-CM | POA: Diagnosis not present

## 2022-10-20 DIAGNOSIS — E042 Nontoxic multinodular goiter: Secondary | ICD-10-CM

## 2022-10-20 DIAGNOSIS — K4090 Unilateral inguinal hernia, without obstruction or gangrene, not specified as recurrent: Secondary | ICD-10-CM | POA: Diagnosis not present

## 2022-10-20 DIAGNOSIS — I5023 Acute on chronic systolic (congestive) heart failure: Secondary | ICD-10-CM

## 2022-10-20 DIAGNOSIS — E041 Nontoxic single thyroid nodule: Secondary | ICD-10-CM | POA: Diagnosis not present

## 2022-10-20 LAB — RENAL FUNCTION PANEL
Albumin: 4.5 g/dL (ref 3.8–4.8)
BUN/Creatinine Ratio: 17 (ref 10–24)
Calcium: 10.6 mg/dL — ABNORMAL HIGH (ref 8.6–10.2)
Creatinine, Ser: 1.51 mg/dL — ABNORMAL HIGH (ref 0.76–1.27)
Glucose: 103 mg/dL — ABNORMAL HIGH (ref 70–99)
Potassium: 4.8 mmol/L (ref 3.5–5.2)
eGFR: 48 mL/min/{1.73_m2} — ABNORMAL LOW (ref >59)

## 2022-10-20 NOTE — Progress Notes (Signed)
Subjective: CC: Hospital discharge follow-up PCP: Raliegh Ip, DO ZOX:[id number] W Szostak is a 77 y.o. male who is accompanied today's visit by his wife [name].  He is presenting to clinic today for:  1.  Hospital discharge follow-up Patient recently hospitalized for acute on chronic systolic heart failure, AKI and UTI He will be seeing cardiology within the next week.  He saw urology yesterday and was told that the right renal lesion appeared to be benign.  They have elected to hold off on any treatment of these inguinal hernias.  He will be getting imaging and repeat labs etc. done in July.  Spleen was also noted to be slightly enlarged at 13.6 cm.  Outpatient follow-up with this was recommended .  He reports improvement in symptomology.  Several pounds of fluid was removed from him in the hospital.  He seems to be breathing better.  Has appointment for CPAP/OSA evaluation in a couple of weeks.  Wants to hold off on ordering his nighttime O2 for now because he is hoping that they will do a bleed by with his CPAP machine.  ROS: Per HPI  No Known Allergies Past Medical History:  Diagnosis Date   Arthritis    knees, shoulders, elbows   Bladder cancer [facility])    urologist-  dr eskridge   Diverticulosis of colon    Fibromyalgia    GERD (gastroesophageal reflux disease)    History of diverticulitis of colon    History of gastric ulcer    due to aleve   Hypertension    Lower urinary tract symptoms (LUTS)    OSA (obstructive sleep apnea)    NON-COMPLIANT  CPAP  --- BUT PT USES OXYGEN AT NIGHT 2.5L VIA [place] (PT'S DECISION)   PONV (postoperative nausea and vomiting)    Psoriasis    Sepsis (HCC)    january 2021   Tinnitus    right ear more, has tranmitter in right ear removable at hs   Wears glasses    Wears partial dentures     Current Outpatient Medications:    benzonatate (TESSALON) 100 MG capsule, Take 1 capsule (100 mg total) by mouth 3 (three) times daily as needed for  cough., Disp: 20 capsule, Rfl: 0   fluticasone (FLONASE) 50 MCG/ACT nasal spray, Place 1 spray into both nostrils daily., Disp: 16 g, Rfl: 0   furosemide (LASIX) 20 MG tablet, Take 1 tablet (20 mg) daily as needed if you gain more than 3 pounds in 1 day or 5 pounds in 1 week., Disp: 30 tablet, Rfl: 0   levothyroxine (SYNTHROID) 25 MCG tablet, Take 1 tablet (25 mcg total) by mouth daily before breakfast. Take on an empty stomach with water only.  Wait to consume other foods/ drinks/ meds for 30 minutes., Disp: 90 tablet, Rfl: 3   metoprolol succinate (TOPROL-XL) 25 MG 24 hr tablet, Take 1 tablet (25 mg total) by mouth daily., Disp: 30 tablet, Rfl: 1   polyvinyl alcohol (LIQUIFILM TEARS) 1.4 % ophthalmic solution, Place 1 drop into both eyes as needed for dry eyes., Disp: 15 mL, Rfl: 0 Social History   Socioeconomic History   Marital status: Married    Spouse name: Peggy   Number of children: 2   Years of education: 14   Highest education level: Associate degree: occupational, Scientist, product/process development, or vocational program  Occupational History   Occupation: retired    Comment: [employer], 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   Tobacco Use   Smoking status: Former  Packs/day: 2.00    Years: 40.00    Additional pack years: 0.00    Total pack years: 80.00    Types: Cigarettes    Quit date: 06/01/1997    Years since quitting: 25.4   Smokeless tobacco: Never  Vaping Use   Vaping Use: Never used  Substance and Sexual Activity   Alcohol use: No   Drug use: No   Sexual activity: Not Currently  Other Topics Concern   Not on file  Social History Narrative   One level home with wife    72/6215 - 51 year old MIL lives with them   Social Determinants of Health   Financial Resource Strain: Low Risk  (10/15/2022)   Overall Financial Resource Strain (CARDIA)    Difficulty of Paying Living Expenses: Not hard at all  Food Insecurity: No Food Insecurity (10/15/2022)   Hunger Vital Sign    Worried About Running Out of Food in the  Last Year: Never true    Ran Out of Food in the Last Year: Never true  Transportation Needs: No Transportation Needs (10/15/2022)   PRAPARE - Administrator, Civil Service (Medical): No    Lack of Transportation (Non-Medical): No  Physical Activity: Inactive (08/14/2022)   Exercise Vital Sign    Days of Exercise per Week: 0 days    Minutes of Exercise per Session: 0 min  Stress: No Stress Concern Present (08/14/2022)   [employer] of Occupational Health - Occupational Stress Questionnaire    Feeling of Stress : Not at all  Social Connections: Moderately Integrated (08/14/2022)   Social Connection and Isolation Panel [NHANES]    Frequency of Communication with Friends and Family: More than three times a week    Frequency of Social Gatherings with Friends and Family: More than three times a week    Attends Religious Services: More than 4 times per year    Active Member of [employer] or Organizations: No    Attends Banker Meetings: Never    Marital Status: Married  Catering manager Violence: Not At Risk (10/07/2022)   Humiliation, Afraid, Rape, and Kick questionnaire    Fear of Current or Ex-Partner: No    Emotionally Abused: No    Physically Abused: No    Sexually Abused: No   Family History  Problem Relation Age of Onset   Alzheimer's disease Mother    Heart attack Father    Heart disease Father    Irregular heart beat Brother        DEFIB.   Congestive Heart Failure Brother    Diabetes Daughter     Objective: Office vital signs reviewed. BP 133/83   Pulse 81   Temp 98.6 F (37 C)   Ht 5\' 6"  (1.676 m)   Wt 202 lb (91.6 kg)   SpO2 96%   BMI 32.60 kg/m   Physical Examination:  General: Awake, alert, well nourished, No acute distress HEENT: Sclera white.  Moist mucous membranes Cardio: regular rate and rhythm, S1S2 heard, no murmurs appreciated Pulm: clear to auscultation bilaterally, no wheezes, rhonchi or rales; normal work of breathing on room  air Extremities: warm, well perfused, No edema, cyanosis or clubbing; +2 pulses bilaterally    Assessment/ Plan: 77 y.o. male   Acute on chronic systolic CHF (congestive heart failure) [facility])  Hospital discharge follow-up  Kidney lesion - Plan: Renal Function Panel  Left inguinal hernia  Spleen enlarged - Plan: Ambulatory referral to Hematology / Oncology  I  reviewed the discharge summary and recommendations.  He has appropriate follow-up with urology, cardiology.  I have placed referral to his hematologist/oncologist, Dr Clelia Croft, whom he saw initially with cancer diagnosis.  Uncertain etiology of enlarged spleen and listed secondary to complications from renal disease.  Has known chronic thrombocytopenia.  Further management of inguinal hernias per urology but it sounds like they are holding off on any management of this for now due to other complicated issues  No orders of the defined types were placed in this encounter.  No orders of the defined types were placed in this encounter.   Today's visit is for Transitional Care Management.  The patient was discharged from [facility] on 10/13/2022 with a primary diagnosis of CHF.   Contact with the patient and/or caregiver, by a clinical staff member, was made on 10/15/2022 and was documented as a telephone encounter within the EMR.  Through chart review and discussion with the patient I have determined that management of their condition is of moderate complexity.    Raliegh Ip, DO [facility] [phone]

## 2022-10-21 ENCOUNTER — Telehealth: Payer: Self-pay | Admitting: Internal Medicine

## 2022-10-21 ENCOUNTER — Encounter: Payer: Self-pay | Admitting: Oncology

## 2022-10-21 LAB — RENAL FUNCTION PANEL
BUN: 26 mg/dL (ref 8–27)
CO2: 24 mmol/L (ref 20–29)
Chloride: 96 mmol/L (ref 96–106)
Phosphorus: 2.8 mg/dL (ref 2.8–4.1)
Sodium: 135 mmol/L (ref 134–144)

## 2022-10-21 NOTE — Telephone Encounter (Signed)
scheduled per referral, pt has been called and confirmed date and time. Pt is aware of location and to arrive early for check in   

## 2022-10-22 LAB — CYTOLOGY - NON PAP

## 2022-10-23 ENCOUNTER — Encounter: Payer: Self-pay | Admitting: Cardiology

## 2022-10-23 ENCOUNTER — Ambulatory Visit: Payer: Medicare HMO | Attending: Cardiology | Admitting: Cardiology

## 2022-10-23 ENCOUNTER — Ambulatory Visit (INDEPENDENT_AMBULATORY_CARE_PROVIDER_SITE_OTHER): Payer: Medicare HMO

## 2022-10-23 VITALS — BP 116/76 | HR 89 | Ht 66.0 in | Wt 203.8 lb

## 2022-10-23 DIAGNOSIS — N179 Acute kidney failure, unspecified: Secondary | ICD-10-CM

## 2022-10-23 DIAGNOSIS — I7121 Aneurysm of the ascending aorta, without rupture: Secondary | ICD-10-CM

## 2022-10-23 DIAGNOSIS — I5042 Chronic combined systolic (congestive) and diastolic (congestive) heart failure: Secondary | ICD-10-CM

## 2022-10-23 DIAGNOSIS — I10 Essential (primary) hypertension: Secondary | ICD-10-CM | POA: Diagnosis not present

## 2022-10-23 DIAGNOSIS — I2510 Atherosclerotic heart disease of native coronary artery without angina pectoris: Secondary | ICD-10-CM

## 2022-10-23 MED ORDER — LOSARTAN POTASSIUM 25 MG PO TABS: 25.0000 mg | ORAL_TABLET | Freq: Every day | ORAL | 3 refills | Status: DC

## 2022-10-23 MED ORDER — ATORVASTATIN CALCIUM 20 MG PO TABS: 20.0000 mg | ORAL_TABLET | Freq: Every day | ORAL | 3 refills | Status: DC

## 2022-10-23 NOTE — Patient Instructions (Signed)
Medication Instructions:  Your physician has recommended you make the following change in your medication:   -Start taking losartan (cozaar) 25mg  once daily.  -Start taking atorvastatin (lipitor) 20mg  once daily in the evening. -Start taking this 1 week after beginning losartan.  *If you need a refill on your cardiac medications before your next appointment, please call your pharmacy*   Lab Work: Your physician recommends that you return for lab work in: 1 week for BMET  If you have labs (blood work) drawn today and your tests are completely normal, you will receive your results only by: MyChart Message (if you have MyChart) OR A paper copy in the mail If you have any lab test that is abnormal or we need to change your treatment, we will call you to review the results.   Testing/Procedures: Christena Deem- Long Term Monitor Instructions  Your physician has requested you wear a ZIO patch monitor for 14 days.  This is a single patch monitor. [employer] supplies one patch monitor per enrollment. Additional stickers are not available. Please do not apply patch if you will be having a Nuclear Stress Test,  Echocardiogram, Cardiac CT, MRI, or Chest Xray during the period you would be wearing the  monitor. The patch cannot be worn during these tests. You cannot remove and re-apply the  ZIO XT patch monitor.  Your ZIO patch monitor will be mailed 3 day USPS to your address on file. It may take 3-5 days  to receive your monitor after you have been enrolled.  Once you have received your monitor, please review the enclosed instructions. Your monitor  has already been registered assigning a specific monitor serial # to you.  Billing and Patient Assistance Program Information  We have supplied [employer] with any of your insurance information on file for billing purposes. [employer] offers a sliding scale Patient Assistance Program for patients that do not have  insurance, or whose insurance does not  completely cover the cost of the ZIO monitor.  You must apply for the Patient Assistance Program to qualify for this discounted rate.  To apply, please call [employer] at [phone], select option 4, select option 2, ask to apply for  Patient Assistance Program. Meredeth Ide will ask your household income, and how many people  are in your household. They will quote your out-of-pocket cost based on that information.  [employer] will also be able to set up a 30-month, interest-free payment plan if needed.  Applying the monitor   Shave hair from upper left chest.  Hold abrader disc by orange tab. Rub abrader in 40 strokes over the upper left chest as  indicated in your monitor instructions.  Clean area with 4 enclosed alcohol pads. Let dry.  Apply patch as indicated in monitor instructions. Patch will be placed under collarbone on left  side of chest with arrow pointing upward.  Rub patch adhesive wings for 2 minutes. Remove white label marked "1". Remove the white  label marked "2". Rub patch adhesive wings for 2 additional minutes.  While looking in a mirror, press and release button in center of patch. A small green light will  flash 3-4 times. This will be your only indicator that the monitor has been turned on.  Do not shower for the first 24 hours. You may shower after the first 24 hours.  Press the button if you feel a symptom. You will hear a small click. Record Date, Time and  Symptom in the Patient Logbook.  When you are  ready to remove the patch, follow instructions on the last 2 pages of Patient  Logbook. Stick patch monitor onto the last page of Patient Logbook.  Place Patient Logbook in the blue and white box. Use locking tab on box and tape box closed  securely. The blue and white box has prepaid postage on it. Please place it in the mailbox as  soon as possible. Your physician should have your test results approximately 7 days after the  monitor has been mailed back to [facility].  Call  [facility] Customer Care at [phone] if you have questions regarding  your ZIO XT patch monitor. Call them immediately if you see an orange light blinking on your  monitor.  If your monitor falls off in less than 4 days, contact our Monitor department at [phone].  If your monitor becomes loose or falls off after 4 days call [employer] at [phone] for  suggestions on securing your monitor    Follow-Up: At [facility], you and your health needs are our priority.  As part of our continuing mission to provide you with exceptional heart care, we have created designated Provider Care Teams.  These Care Teams include your primary Cardiologist (physician) and Advanced Practice Providers (APPs -  Physician Assistants and Nurse Practitioners) who all work together to provide you with the care you need, when you need it.  We recommend signing up for the patient portal called "MyChart".  Sign up information is provided on this After Visit Summary.  MyChart is used to connect with patients for Virtual Visits (Telemedicine).  Patients are able to view lab/test results, encounter notes, upcoming appointments, etc.  Non-urgent messages can be sent to your provider as well.   To learn more about what you can do with MyChart, go to [email or web address].    Your next appointment:   2-3 month(s)  Provider:   Dr. Bjorn Pippin   Other Instructions Dr. Bjorn Pippin would like for you to come back in 3-4 weeks to see a clinical pharmD to review heart failure medications.

## 2022-10-23 NOTE — Progress Notes (Signed)
HEART & VASCULAR TRANSITION OF CARE CONSULT NOTE     Referring Physician: Dr. Rito Ehrlich Primary Care: Raliegh Ip, DO Primary Cardiologist: Dr. Bjorn Pippin  HPI: Referred to clinic by Dr. Rito Ehrlich for heart failure consultation.   Billy Rasmussen is a 77 y.o. male with a hx of bladder cancer s/p left nephrectomy, cystoprostatectomy, fibromyalgia, hypertension, OSA not on CPAP, psoriasis, and chronic systolic heart failure.  He was seen in the ED 09/27/22 with new dyspnea. CT chest showed no PE, but suggestive of interstitial edema, and new AAA 4.5 cm. He was given IV lasix and discharged home, with referral to cardiology. Initial visit with Dr. Bjorn Pippin, he has fevers and soft BP. He appeared euvolemic. Echo ordered and close follow up arranged.  Admitted 10/06/22 with acute systolic heat failure. Diuresed with IV lasix. Echo showed EF < 20%, mild LVH, septal dyssynergy, RV mildly reduced, AAA 4.5 mm. EKG showed LBBB, QRS 162 ms. Underwent R/LHC showing mild non-obstructive CAD, pulmonary venous hypertension, PCWP 27, preserved output. Unable to undergo cMRI as he has metal in his eye. GDMT titrated, he was discharged home, weight 210.5 lbs.  Today he presents to [facility] for post hospital HF follow up. Here w/ wife and daughter. Overall feeling fine. Denies increasing SOB, CP, edema, or PND/Orthopnea. Appetite ok. No fever or chills. Weight at home 201 pounds. Taking all medications. He reports some occasional positional dizziness if he stands too quickly. No syncope/ near syncope. He saw Dr. Bjorn Pippin last week and losartan was added to regimen. BP today 111/68.      Cardiac Testing  - R/LHC (5/24): RCA 30% stenosis, RA 6, PA 55/26(37), PCWP 27, CO/CI (Fick) 7.89/3.85, PVR 1.84, PAPi 4.1  - Echo (5/24): EF < 20%, mild LVH, septal dyssynergy, RV mildly reduced, AAA 4.5 mm  - Echo (11/20): EF 55-60%, ascending aortic aneurysm measuring 45 mm, normal RV function, no significant valvular  disease.   Review of Systems: [y] = yes, [ ]  = no   General: Weight gain [ ] ; Weight loss [ ] ; Anorexia [ ] ; Fatigue [ ] ; Fever [ ] ; Chills [ ] ; Weakness [ ]   Cardiac: Chest pain/pressure [ ] ; Resting SOB [ ] ; Exertional SOB [ ] ; Orthopnea [ ] ; Pedal Edema [ ] ; Palpitations [ ] ; Syncope [ ] ; Presyncope [ ] ; Paroxysmal nocturnal dyspnea[ ]   Pulmonary: Cough [ ] ; Wheezing[ ] ; Hemoptysis[ ] ; Sputum [ ] ; Snoring [ ]   GI: Vomiting[ ] ; Dysphagia[ ] ; Melena[ ] ; Hematochezia [ ] ; Heartburn[ ] ; Abdominal pain [ ] ; Constipation [ ] ; Diarrhea [ ] ; BRBPR [ ]   GU: Hematuria[ ] ; Dysuria [ ] ; Nocturia[ ]   Vascular: Pain in legs with walking [ ] ; Pain in feet with lying flat [ ] ; Non-healing sores [ ] ; Stroke [ ] ; TIA [ ] ; Slurred speech [ ] ;  Neuro: Headaches[ ] ; Vertigo[ ] ; Seizures[ ] ; Paresthesias[ ] ;Blurred vision [ ] ; Diplopia [ ] ; Vision changes [ ]   Ortho/Skin: Arthritis [ ] ; Joint pain [ ] ; Muscle pain [ ] ; Joint swelling [ ] ; Back Pain [ ] ; Rash [ ]   Psych: Depression[ ] ; Anxiety[ ]   Heme: Bleeding problems [ ] ; Clotting disorders [ ] ; Anemia [ ]   Endocrine: Diabetes [ ] ; Thyroid dysfunction[ ]    Past Medical History:  Diagnosis Date   Arthritis    knees, shoulders, elbows   Bladder cancer [facility])    urologist-  dr eskridge   Diverticulosis of colon    Fibromyalgia    GERD (gastroesophageal reflux disease)  History of diverticulitis of colon    History of gastric ulcer    due to aleve   Hypertension    Lower urinary tract symptoms (LUTS)    OSA (obstructive sleep apnea)    NON-COMPLIANT  CPAP  --- BUT PT USES OXYGEN AT NIGHT 2.5L VIA [place] (PT'S DECISION)   PONV (postoperative nausea and vomiting)    Psoriasis    Sepsis (HCC)    january 2021   Tinnitus    right ear more, has tranmitter in right ear removable at hs   Wears glasses    Wears partial dentures     Current Outpatient Medications  Medication Sig Dispense Refill   benzonatate (TESSALON) 100 MG capsule Take 1 capsule (100  mg total) by mouth 3 (three) times daily as needed for cough. 20 capsule 0   fluticasone (FLONASE) 50 MCG/ACT nasal spray Place 1 spray into both nostrils daily. 16 g 0   furosemide (LASIX) 20 MG tablet Take 1 tablet (20 mg) daily as needed if you gain more than 3 pounds in 1 day or 5 pounds in 1 week. 30 tablet 0   levothyroxine (SYNTHROID) 25 MCG tablet Take 1 tablet (25 mcg total) by mouth daily before breakfast. Take on an empty stomach with water only.  Wait to consume other foods/ drinks/ meds for 30 minutes. 90 tablet 3   losartan (COZAAR) 25 MG tablet Take 1 tablet (25 mg total) by mouth daily. 30 tablet 3   metoprolol succinate (TOPROL-XL) 25 MG 24 hr tablet Take 1 tablet (25 mg total) by mouth daily. 30 tablet 1   polyvinyl alcohol (LIQUIFILM TEARS) 1.4 % ophthalmic solution Place 1 drop into both eyes as needed for dry eyes. 15 mL 0   atorvastatin (LIPITOR) 20 MG tablet Take 1 tablet (20 mg total) by mouth daily. (Patient not taking: Reported on 10/29/2022) 90 tablet 3   No current facility-administered medications for this encounter.    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   Tobacco Use   Smoking status: Former    Packs/day: 2.00    Years: 40.00    Additional pack years: 0.00    Total pack years: 80.00    Types: Cigarettes    Quit date: 06/01/1997    Years since quitting: 25.4   Smokeless tobacco: Never  Vaping Use   Vaping Use: Never used  Substance and Sexual Activity   Alcohol use: No   Drug use: No   Sexual activity: Not Currently  Other Topics Concern   Not on file  Social History Narrative   One level home with wife    64/7929 - 65 year old MIL lives with them    Social Determinants of Health   Financial Resource Strain: Low Risk  (10/15/2022)   Overall Financial Resource Strain (CARDIA)    Difficulty of Paying Living Expenses: Not hard at all  Food Insecurity: No Food Insecurity (10/15/2022)   Hunger Vital Sign    Worried About Running Out of Food in the Last Year: Never true    Ran Out of Food in the Last Year: Never true  Transportation Needs: No Transportation Needs (10/15/2022)   PRAPARE -  Administrator, Civil Service (Medical): No    Lack of Transportation (Non-Medical): No  Physical Activity: Inactive (08/14/2022)   Exercise Vital Sign    Days of Exercise per Week: 0 days    Minutes of Exercise per Session: 0 min  Stress: No Stress Concern Present (08/14/2022)   [employer] of Occupational Health - Occupational Stress Questionnaire    Feeling of Stress : Not at all  Social Connections: Moderately Integrated (08/14/2022)   Social Connection and Isolation Panel [NHANES]    Frequency of Communication with Friends and Family: More than three times a week    Frequency of Social Gatherings with Friends and Family: More than three times a week    Attends Religious Services: More than 4 times per year    Active Member of [employer] or Organizations: No    Attends Banker Meetings: Never    Marital Status: Married  Catering manager Violence: Not At Risk (10/07/2022)   Humiliation, Afraid, Rape, and Kick questionnaire    Fear of Current or Ex-Partner: No    Emotionally Abused: No    Physically Abused: No    Sexually Abused: No      Family History  Problem Relation Age of Onset   Alzheimer's disease Mother    Heart attack Father    Heart disease Father    Irregular heart beat Brother        DEFIB.   Congestive Heart Failure Brother    Diabetes Daughter     Vitals:   10/29/22 1005  BP: 111/68  Pulse: 65  SpO2: 96%  Weight: 94.1 kg (207 lb 6 oz)    PHYSICAL EXAM: General:  NAD. No resp  difficulty HEENT: Normal Neck: Supple. No JVD. Carotids 2+ bilat; no bruits. No lymphadenopathy or thryomegaly appreciated. Cor: PMI nondisplaced. Regular rate & rhythm. No rubs, gallops or murmurs. Lungs: Clear Abdomen: Soft, nontender, nondistended. No hepatosplenomegaly. No bruits or masses. Good bowel sounds. Extremities: No cyanosis, clubbing, rash, edema Neuro: Alert & oriented x 3, cranial nerves grossly intact. Moves all 4 extremities w/o difficulty. Affect pleasant.  ECG: not done today, Hospital EKG showed LBBB, QRS 162 ms    ASSESSMENT & PLAN: Chronic Systolic Heart Failure - New diagnosis. NICM, possible LBBB CM QRS 162 msec on last ECG - Echo (5/24): < 20%, mild LVH, septal dyssynergy, RV mildly reduced - R/LHC (5/24): mild, non-obs CAD; RA 6, PA 55/26(37), PCWP 27, CO/CI (Fick) 7.89/3.85, PVR 1.84, PAPi 4.1 - Unable to get cMRI with metal in his eye. - NYHA II, volume OK today - W/ solitary kidney, recent addition of losartan several days ago, soft BP and occasional positional dizziness, will not adjust meds today . No Entresto yet  - Continue Toprol XL 25 mg daily. - Continue losartan 25 mg daily. - Continue Lasix 20 mg mg daily  - Hold off on SGLT2i with recent UTI - BMP ordered for tomorrow  - I think he has LBBB CM. Will refer to EP for consideration for CRT device   CAD - Non obs on LHC (5/24) - No chest pain - Continue statin LDL goal < 70  Palpitations - Has Zio in place, per gen cards. Dr. Bjorn Pippin to follow   CKD IIIa - Solitary kidney, s/p left nephrectomy - followed by nephrology  - BMP ordered per cardiology   3. HTN - BP well controlled - continue current GDMT per above   4. AAA - 4.5 mm - Will  need yearly CT monitoring. Dr. Bjorn Pippin to follow  - consider refer to CT surgery   5. LBBB - QRS 162 ms on EKG - Echo w/ septal dyssynergy, LVEF < 20%. LHC w/ nonobstructive CAD  - send to EP for CRT device   NYHA II GDMT  Diuretic: continue  Lasix 20 mg daily  BB: continue Toprol Xl 25 mg daily. Ace/ARB/ARNI: continue losartan 25 mg daily. MRA: start spiro 12.5 mg daily. SGLT2i: hold off with recent UTI  Referred to HFSW (PCP, Medications, Transportation, ETOH Abuse, Drug Abuse, Insurance, Financial ): No Refer to Pharmacy: No Refer to Home Health: No Refer to Advanced Heart Failure Clinic: No  Refer to General Cardiology: Yes (already followed by Dr. Bjorn Pippin)  F/u w/ cardiology. I will refer to EP for consideration for CRT device. If EF does not improve w/ GDMT/ device therapy, consider future referral to the [facility].   Robbie Lis, PA-C  10/29/22

## 2022-10-23 NOTE — Progress Notes (Signed)
Cardiology Office Note:    Date:  10/23/2022   ID:  Billy Rasmussen, Billy Rasmussen [birth year], MRN [id number]  PCP:  Raliegh Ip, DO  Cardiologist:  Nicki Guadalajara, MD  Electrophysiologist:  None   Referring MD: Raliegh Ip, DO   Chief Complaint  Patient presents with   Congestive Heart Failure    History of Present Illness:    Billy Rasmussen is a 77 y.o. male with a hx of chronic combined systolic and diastolic heart failure, bladder cancer s/p left nephrectomy, cystoprostatectomy , fibromyalgia, hypertension, OSA not on CPAP, psoriasis who presents for follow-up.  He was initially seen as an ED follow-up on 10/06/2022.  He was seen in the ED on 09/27/2022 for shortness of breath.  Chest x-ray shows interstitial edema and mildly elevated BNP.  CTPA showed no PE but findings suggestive of pulmonary edema, as well as 4.5 cm ascending aortic aneurysm.  He was given Lasix with improvement and discharged on p.o. Lasix 20 mg daily with referral to cardiology.  Echocardiogram 04/2019 showed EF 55-60%, ascending aortic aneurysm measuring 45 mm, normal RV function, no significant valvular disease.  At initial clinic visit on 10/06/2022, he reported fever and was hypotensive.  He was sent to the ED.  He was admitted from 5/7 through 10/13/2022.  He was treated for UTI.  Echo 10/17/2022 showed EF less than 20%, mildly reduced RV systolic dysfunction, RVSP 44, severe left atrial enlargement, mild MR, dilated ascending aorta measuring 45 mm.  RHC/LHC on 10/09/2022 showed nonobstructive CAD (30% RCA stenosis, RA 6 RV 50/3, PA 55/26/37 PCWP 27 CI 3.85.  Had AKI during admission, diuresis was held and he was discharged on as needed Lasix.  Since last clinic visit, he reports he is doing okay.  Has been monitoring weight and blood pressure, have been stable.  Denies any chest pain, dyspnea, lower extremity edema, or palpitations.   Reports some lightheadedness but denies any syncope.   BP Readings from Last  3 Encounters:  10/23/22 116/76  10/20/22 133/83  10/13/22 120/79   Wt Readings from Last 3 Encounters:  10/23/22 203 lb 12.8 oz (92.4 kg)  10/20/22 202 lb (91.6 kg)  10/07/22 211 lb (95.7 kg)     Past Medical History:  Diagnosis Date   Arthritis    knees, shoulders, elbows   Bladder cancer [facility])    urologist-  dr eskridge   Diverticulosis of colon    Fibromyalgia    GERD (gastroesophageal reflux disease)    History of diverticulitis of colon    History of gastric ulcer    due to aleve   Hypertension    Lower urinary tract symptoms (LUTS)    OSA (obstructive sleep apnea)    NON-COMPLIANT  CPAP  --- BUT PT USES OXYGEN AT NIGHT 2.5L VIA  (PT'S DECISION)   PONV (postoperative nausea and vomiting)    Psoriasis    Sepsis (HCC)    january 2021   Tinnitus    right ear more, has tranmitter in right ear removable at hs   Wears glasses    Wears partial dentures     Past Surgical History:  Procedure Laterality Date   APPENDECTOMY     COLECTOMY W/ COLOSTOMY  1996   W/   APPENDECTOMY   COLONOSCOPY N/A 05/11/2018   Procedure: COLONOSCOPY;  Surgeon: Corbin Ade, MD;  [facility];  Service: Endoscopy;  Laterality: N/A;  9:30   COLOSTOMY TAKEDOWN  1996   CYSTOSCOPY  WITH BIOPSY N/A 12/05/2013   Procedure: CYSTO BLADDER BIOPSY AND FULGERATION;  Surgeon: Jerilee Field, MD;  Location: [facility];  Service: Urology;  Laterality: N/A;   CYSTOSCOPY WITH BIOPSY Bilateral 11/13/2014   Procedure: CYSTOSCOPY WITH  BLADDER BIOPSY FULGERATION AND BILATERAL RETROGRADE PYELOGRAMS;  Surgeon: Jerilee Field, MD;  Location: [facility];  Service: Urology;  Laterality: Bilateral;   CYSTOSCOPY WITH FULGERATION N/A 01/18/2018   Procedure: CYSTOSCOPY WITH FULGERATION/ BLADDER BIOPSY;  Surgeon: Jerilee Field, MD;  Location: [facility];  Service: Urology;  Laterality: N/A;   CYSTOSCOPY WITH INJECTION N/A 11/09/2018   Procedure:  CYSTOSCOPY WITH INJECTION OF INDOCYANINE GREEN DYE;  Surgeon: Sebastian Ache, MD;  Location[facility];  Service: Urology;  Laterality: N/A;   CYSTOSCOPY WITH INSERTION OF UROLIFT N/A 01/18/2018   Procedure: CYSTOSCOPY WITH INSERTION OF UROLIFT;  Surgeon: Jerilee Field, MD;  Location: [facility];  Service: Urology;  Laterality: N/A;   CYSTOSCOPY/URETEROSCOPY/HOLMIUM LASER Left 02/17/2019   Procedure: URETEROSCOPY WITH BALLOON DILATION  AND NEPHROSTOGRAM;  Surgeon: Sebastian Ache, MD;  Location: [facility];  Service: Urology;  Laterality: Left;  1 HR   EXCISION RIGHT UPPER ARM LIPOMA  2005   HEMORROIDECTOMY     INGUINAL HERNIA REPAIR Left 1984   IR NEPHROSTOMY PLACEMENT LEFT  01/05/2019   KNEE ARTHROSCOPY Left X3  LAST ONE  2002   ORIF LEFT HUMEROUS FX  1976   POLYPECTOMY  05/11/2018   Procedure: POLYPECTOMY;  Surgeon: Corbin Ade, MD;  [facility];  Service: Endoscopy;;   PROSTATE SURGERY     RIGHT HEART CATH AND CORONARY ANGIOGRAPHY N/A 10/09/2022   Procedure: RIGHT HEART CATH AND CORONARY ANGIOGRAPHY;  Surgeon: [name], MD;  [facility];  Service: Cardiovascular;  Laterality: N/A;   ROBOT ASSISTED LAPAROSCOPIC NEPHRECTOMY Left 07/14/2019   Procedure: XI ROBOTIC ASSISTED LAPAROSCOPIC RETROPERITONEAL NEPHRECTOMY;  Surgeon: Sebastian Ache, MD;  Location: [facility];  Service: Urology;  Laterality: Left;  3 HRS   TONSILLECTOMY  AS CHILD   TOTAL KNEE ARTHROPLASTY Left 2006   REVISION 2007  (AFTER I & D WITH ANTIBIOTIC SPACER PROCEDURE FOR STEPH INFECTION)   TOTAL KNEE ARTHROPLASTY Right 03/30/2016   Procedure: RIGHT TOTAL KNEE ARTHROPLASTY;  Surgeon: Durene Romans, MD;  [facility];  Service: Orthopedics;  Laterality: Right;   TOTAL SHOULDER ARTHROPLASTY Right 04/06/2013   Procedure: RIGHT TOTAL SHOULDER ARTHROPLASTY;  Surgeon: Senaida Lange, MD;  Location: [facility];  Service: Orthopedics;  Laterality: Right;   TOTAL SHOULDER  ARTHROPLASTY Left 07/20/2013   Procedure: LEFT TOTAL SHOULDER ARTHROPLASTY;  Surgeon: Senaida Lange, MD;  Location[facility];  Service: Orthopedics;  Laterality: Left;   TRANSURETHRAL RESECTION OF BLADDER TUMOR N/A 11/12/2015   Procedure: TRANSURETHRAL RESECTION OF BLADDER TUMOR (TURBT);  Surgeon: Jerilee Field, MD;  Location: [facility];  Service: Urology;  Laterality: N/A;   TRANSURETHRAL RESECTION OF BLADDER TUMOR WITH GYRUS (TURBT-GYRUS) N/A 12/05/2013   Procedure: TRANSURETHRAL RESECTION OF BLADDER TUMOR WITH GYRUS (TURBT-GYRUS);  Surgeon: Jerilee Field, MD;  Location: [facility];  Service: Urology;  Laterality: N/A;    Current Medications: Current Meds  Medication Sig   atorvastatin (LIPITOR) 20 MG tablet Take 1 tablet (20 mg total) by mouth daily.   benzonatate (TESSALON) 100 MG capsule Take 1 capsule (100 mg total) by mouth 3 (three) times daily as needed for cough.   fluticasone (FLONASE) 50 MCG/ACT nasal  spray Place 1 spray into both nostrils daily.   furosemide (LASIX) 20 MG tablet Take 1 tablet (20 mg) daily as needed if you gain more than 3 pounds in 1 day or 5 pounds in 1 week.   levothyroxine (SYNTHROID) 25 MCG tablet Take 1 tablet (25 mcg total) by mouth daily before breakfast. Take on an empty stomach with water only.  Wait to consume other foods/ drinks/ meds for 30 minutes.   losartan (COZAAR) 25 MG tablet Take 1 tablet (25 mg total) by mouth daily.   metoprolol succinate (TOPROL-XL) 25 MG 24 hr tablet Take 1 tablet (25 mg total) by mouth daily.   polyvinyl alcohol (LIQUIFILM TEARS) 1.4 % ophthalmic solution Place 1 drop into both eyes as needed for dry eyes.     Allergies:   Patient has no known allergies.   Social History   Socioeconomic History   Marital status: Married    Spouse name: Peggy   Number of children: 2   Years of education: 14   Highest education level: Tax adviser degree: occupational, Scientist, product/process development, or vocational program   Occupational History   Occupation: retired    Comment: [employer], 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   Tobacco Use   Smoking status: Former    Packs/day: 2.00    Years: 40.00    Additional pack years: 0.00    Total pack years: 80.00    Types: Cigarettes    Quit date: 06/01/1997    Years since quitting: 25.4   Smokeless tobacco: Never  Vaping Use   Vaping Use: Never used  Substance and Sexual Activity   Alcohol use: No   Drug use: No   Sexual activity: Not Currently  Other Topics Concern   Not on file  Social History Narrative   One level home with wife    27/4955 - 44 year old MIL lives with them   Social Determinants of Health   Financial Resource Strain: Low Risk  (10/15/2022)   Overall Financial Resource Strain (CARDIA)    Difficulty of Paying Living Expenses: Not hard at all  Food Insecurity: No Food Insecurity (10/15/2022)   Hunger Vital Sign    Worried About Running Out of Food in the Last Year: Never true    Ran Out of Food in the Last Year: Never true  Transportation Needs: No Transportation Needs (10/15/2022)   PRAPARE - Administrator, Civil Service (Medical): No    Lack of Transportation (Non-Medical): No  Physical Activity: Inactive (08/14/2022)   Exercise Vital Sign    Days of Exercise per Week: 0 days    Minutes of Exercise per Session: 0 min  Stress: No Stress Concern Present (08/14/2022)   [employer] of Occupational Health - Occupational Stress Questionnaire    Feeling of Stress : Not at all  Social Connections: Moderately Integrated (08/14/2022)   Social Connection and Isolation Panel [NHANES]    Frequency of Communication with Friends and Family: More than three times a week    Frequency of Social Gatherings with Friends and Family: More than three times a week    Attends Religious Services: More than 4 times per year    Active Member of [employer] or Organizations: No    Attends Engineer, structural: Never    Marital Status: Married     Family  History: The patient's family history includes Alzheimer's disease in his mother; Congestive Heart Failure in his brother; Diabetes in his daughter; Heart attack in his father; Heart disease in his  father; Irregular heart beat in his brother.  ROS:   Please see the history of present illness.     All other systems reviewed and are negative.  EKGs/Labs/Other Studies Reviewed:    The following studies were reviewed today:   EKG:   10/06/22: Sinus rhythm with first-degree AV block, rate 88, nonspecific intraventricular conduction delay, right axis deviation  Recent Labs: 09/16/2022: TSH 2.090 10/07/2022: ALT 13 10/11/2022: B Natriuretic Peptide 183.9; Magnesium 2.0 10/12/2022: Hemoglobin 14.9; Platelets 159 10/20/2022: BUN 26; Creatinine, Ser 1.51; Potassium 4.8; Sodium 135  Recent Lipid Panel    Component Value Date/Time   CHOL 152 07/07/2022 0916   TRIG 171 (H) 07/07/2022 0916   HDL 28 (L) 07/07/2022 0916   CHOLHDL 5.4 (H) 07/07/2022 0916   LDLCALC 94 07/07/2022 0916   LDLDIRECT 96 02/25/2016 1352    Physical Exam:    VS:  BP 116/76   Pulse 89   Ht 5\' 6"  (1.676 m)   Wt 203 lb 12.8 oz (92.4 kg)   SpO2 95%   BMI 32.89 kg/m     Wt Readings from Last 3 Encounters:  10/23/22 203 lb 12.8 oz (92.4 kg)  10/20/22 202 lb (91.6 kg)  10/07/22 211 lb (95.7 kg)     GEN:  Well nourished, well developed in no acute distress HEENT: Normal NECK: No JVD; No carotid bruits CARDIAC: RRR, no murmurs, rubs, gallops RESPIRATORY:  Clear to auscultation without rales, wheezing or rhonchi  ABDOMEN: Soft, non-tender, non-distended MUSCULOSKELETAL:  No edema; No deformity  SKIN: Warm and dry NEUROLOGIC:  Alert and oriented x 3 PSYCHIATRIC:  Normal affect   ASSESSMENT:    1. Chronic combined systolic and diastolic heart failure (HCC)   2. Aneurysm of ascending aorta without rupture (HCC)   3. Coronary artery disease involving native coronary artery of native heart, unspecified whether angina  present   4. AKI (acute kidney injury) (HCC)   5. Essential hypertension     PLAN:    Chronic combined heart failure: He was seen in the ED on 09/27/2022 for shortness of breath.  Chest x-ray shows interstitial edema and mildly elevated BNP.  CTPA showed no PE but findings suggestive of pulmonary edema, as well as 4.5 cm ascending aortic aneurysm.  He was given Lasix with improvement and discharged on p.o. Lasix 20 mg daily with referral to cardiology.  He was admitted 10/06/22 with fever/hypotension found of UTI.  Echo 10/17/2022 showed EF less than 20%, mildly reduced RV systolic dysfunction, RVSP 44, severe left atrial enlargement, mild MR, dilated ascending aorta measuring 45 mm.  RHC/LHC on 10/09/2022 showed nonobstructive CAD (30% RCA stenosis, RA 6 RV 50/3, PA 55/26/37 PCWP 27 CI 3.85.  Had AKI during admission, diuresis was held and he was discharged on as needed Lasix. -Continue Lasix 20 mg daily as needed.  Would monitor daily weights and take Lasix if gains more than 3 pounds in 1 day or 5 pounds in 1 week -Continue Toprol-XL 25 mg daily -Start losartan 25 mg daily.  Check BMET in 1 week -Hold off on SGLT2 inhibitor given frequent UTIs -Ideally would obtain cardiac MRI to evaluate etiology of nonischemic cardiomyopathy.  However he has piece of metal in his eye and is unable to obtain MRI  CAD: LHC on 10/09/2022 showed nonobstructive CAD with 30% RCA stenosis. -Start atorvastatin 20 mg daily.  LDL 94 on 07/07/2022, goal less than 70  Palpitations: Check Zio patch x 2 weeks  Aortic aneurysm: Ascending aortic  aneurysm measuring 45 mm on CTPA 08/2022.  Will follow with CTA chest in 1 year  AKI on CKD 3A: Has history of left nephrectomy due to bladder cancer.  Baseline creatinine appears 1.3-1.5.  Creatinine was up to 1.8 during admission 09/2022.  Improved to 1.5 on 10/20/2022  Hypertension: On Toprol-XL as above.  Add losartan as above  RTC in 2 months  Medication Adjustments/Labs and Tests  Ordered: Current medicines are reviewed at length with the patient today.  Concerns regarding medicines are outlined above.  Orders Placed This Encounter  Procedures   Basic metabolic panel   AMB Referral to [facility] Pharm-D   LONG TERM MONITOR (3-14 DAYS)   Meds ordered this encounter  Medications   losartan (COZAAR) 25 MG tablet    Sig: Take 1 tablet (25 mg total) by mouth daily.    Dispense:  30 tablet    Refill:  3   atorvastatin (LIPITOR) 20 MG tablet    Sig: Take 1 tablet (20 mg total) by mouth daily.    Dispense:  90 tablet    Refill:  3    Patient Instructions  Medication Instructions:  Your physician has recommended you make the following change in your medication:   -Start taking losartan (cozaar) 25mg  once daily.  -Start taking atorvastatin (lipitor) 20mg  once daily in the evening. -Start taking this 1 week after beginning losartan.  *If you need a refill on your cardiac medications before your next appointment, please call your pharmacy*   Lab Work: Your physician recommends that you return for lab work in: 1 week for BMET  If you have labs (blood work) drawn today and your tests are completely normal, you will receive your results only by: MyChart Message (if you have MyChart) OR A paper copy in the mail If you have any lab test that is abnormal or we need to change your treatment, we will call you to review the results.   Testing/Procedures: Christena Deem- Long Term Monitor Instructions  Your physician has requested you wear a ZIO patch monitor for 14 days.  This is a single patch monitor. [employer] supplies one patch monitor per enrollment. Additional stickers are not available. Please do not apply patch if you will be having a Nuclear Stress Test,  Echocardiogram, Cardiac CT, MRI, or Chest Xray during the period you would be wearing the  monitor. The patch cannot be worn during these tests. You cannot remove and re-apply the  ZIO XT patch monitor.  Your ZIO  patch monitor will be mailed 3 day USPS to your address on file. It may take 3-5 days  to receive your monitor after you have been enrolled.  Once you have received your monitor, please review the enclosed instructions. Your monitor  has already been registered assigning a specific monitor serial # to you.  Billing and Patient Assistance Program Information  We have supplied [employer] with any of your insurance information on file for billing purposes. [employer] offers a sliding scale Patient Assistance Program for patients that do not have  insurance, or whose insurance does not completely cover the cost of the ZIO monitor.  You must apply for the Patient Assistance Program to qualify for this discounted rate.  To apply, please call [employer] at [phone], select option 4, select option 2, ask to apply for  Patient Assistance Program. Meredeth Ide will ask your household income, and how many people  are in your household. They will quote your out-of-pocket cost based on that information.  [employer] will also be able to set up a 16-month, interest-free payment plan if needed.  Applying the monitor   Shave hair from upper left chest.  Hold abrader disc by orange tab. Rub abrader in 40 strokes over the upper left chest as  indicated in your monitor instructions.  Clean area with 4 enclosed alcohol pads. Let dry.  Apply patch as indicated in monitor instructions. Patch will be placed under collarbone on left  side of chest with arrow pointing upward.  Rub patch adhesive wings for 2 minutes. Remove white label marked "1". Remove the white  label marked "2". Rub patch adhesive wings for 2 additional minutes.  While looking in a mirror, press and release button in center of patch. A small green light will  flash 3-4 times. This will be your only indicator that the monitor has been turned on.  Do not shower for the first 24 hours. You may shower after the first 24 hours.  Press the button if you feel a  symptom. You will hear a small click. Record Date, Time and  Symptom in the Patient Logbook.  When you are ready to remove the patch, follow instructions on the last 2 pages of Patient  Logbook. Stick patch monitor onto the last page of Patient Logbook.  Place Patient Logbook in the blue and white box. Use locking tab on box and tape box closed  securely. The blue and white box has prepaid postage on it. Please place it in the mailbox as  soon as possible. Your physician should have your test results approximately 7 days after the  monitor has been mailed back to [place].  Call [facility] Customer Care at [phone] if you have questions regarding  your ZIO XT patch monitor. Call them immediately if you see an orange light blinking on your  monitor.  If your monitor falls off in less than 4 days, contact our Monitor department at [phone].  If your monitor becomes loose or falls off after 4 days call [employer] at [phone] for  suggestions on securing your monitor    Follow-Up: At [facility], you and your health needs are our priority.  As part of our continuing mission to provide you with exceptional heart care, we have created designated Provider Care Teams.  These Care Teams include your primary Cardiologist (physician) and Advanced Practice Providers (APPs -  Physician Assistants and Nurse Practitioners) who all work together to provide you with the care you need, when you need it.  We recommend signing up for the patient portal called "MyChart".  Sign up information is provided on this After Visit Summary.  MyChart is used to connect with patients for Virtual Visits (Telemedicine).  Patients are able to view lab/test results, encounter notes, upcoming appointments, etc.  Non-urgent messages can be sent to your provider as well.   To learn more about what you can do with MyChart, go to [email or web address].    Your next appointment:   2-3  month(s)  Provider:   Dr. Bjorn Pippin   Other Instructions Dr. Bjorn Pippin would like for you to come back in 3-4 weeks to see a clinical pharmD to review heart failure medications.    Signed, Little Ishikawa, MD  10/23/2022 11:16 AM    [facility]

## 2022-10-23 NOTE — Progress Notes (Unsigned)
Enrolled patient for a 14 day Zio XT  monitor to be mailed to patients home  °

## 2022-10-27 DIAGNOSIS — I7121 Aneurysm of the ascending aorta, without rupture: Secondary | ICD-10-CM

## 2022-10-27 DIAGNOSIS — N179 Acute kidney failure, unspecified: Secondary | ICD-10-CM | POA: Diagnosis not present

## 2022-10-27 DIAGNOSIS — I5042 Chronic combined systolic (congestive) and diastolic (congestive) heart failure: Secondary | ICD-10-CM

## 2022-10-27 DIAGNOSIS — I2510 Atherosclerotic heart disease of native coronary artery without angina pectoris: Secondary | ICD-10-CM

## 2022-10-27 DIAGNOSIS — I10 Essential (primary) hypertension: Secondary | ICD-10-CM | POA: Diagnosis not present

## 2022-10-28 NOTE — Progress Notes (Signed)
[facility] Transitions of Care Clinic Heart Failure Pharmacist Encounter  PCP: Raliegh Ip, DO PCP-Cardiologist: Nicki Guadalajara, MD  HPI:   77 YO male with a PMH of hypertension, hypothyroidism, history of bladder cancer status post left nephrectomy, cystoprostatectomy, ascending aortic aneurysm.    Patient presented to ED 5/7 with complaints of shortness of breath and generalized weakness x3 weeks. Patient had a recent ED admission (09/27/22) that was suggestive of pulmonary edema on both CXR and CTPA--patient was discharged on Lasix 20 mg daily and told to follow-up with outpatient cardiology. Lasix was discontinued during the outpatient cardiology appointment on 10/06/22 due to hypotension and suspected infection and patient was transferred to [facility]. Echo from 5/8 showed LVEF <20%, global hypokinesis, septal dyssenergy with left bundle branch block by ECG, RV dysfunction and RVSP 44 mmHg, mild MVR. CXR from this admission was not concerning for pulmonary edema. R/LHC on 5/10 showed mild diffuse CAD, RAP 7 mmHg, PAP 37 mmHg, PCWP 27 mmHg, CO/CI 7.9/3.9 L/min. Patient was discharged from the hospital on 5/14.  Patient was seen by family medicine on 5/21 and cardiology on 5/24 post-discharge. During the cardiology appointment, the patient was instructed to continue furosemide 20 mg PRN and metoprolol succinate 25 mg daily--losartan 25 mg daily was added to his regimen.   Today, Billy Rasmussen presents to the Heart Failure [facility] for follow up.   Shortness of breath/dyspnea on exertion? {YES NO:22349}  Orthopnea/PND? {YES NO:22349} Edema? {YES NO:22349} Lightheadedness/dizziness? {YES NO:22349} Daily weights at home? {YES NO:22349} Blood pressure/heart rate monitoring at home? {YES [id number] Following low-sodium/fluid-restricted diet? {YES NO:22349} Taking medications as prescribed? {YES NO:22349}  HF Medications: Diuretic: furosemide 20 mg daily PRN Beta Blocker:  metoprolol succinate 25 mg daily ACE/ARB/ARNI: losartan 25 mg daily  Has the patient been experiencing any side effects to the medications prescribed?  {YES NO:22349}  Does the patient have any problems obtaining medications due to transportation or finances?   {YES NO:22349}  Understanding of regimen: {excellent/good/fair/poor:19665} Understanding of indications: {excellent/good/fair/poor:19665} Potential of compliance: {excellent/good/fair/poor:19665} Patient understands to avoid NSAIDs. Patient understands to avoid decongestants.   Pertinent Lab Values: Serum creatinine ***, BUN ***, Potassium ***, Sodium ***, BNP ***, Magnesium ***, Digoxin ***   Vital Signs: Weight: *** lbs (discharge weight: 211 lbs) Blood pressure: ***  Heart rate: ***   Medication Assistance / Insurance Benefits Check: Does the patient have prescription insurance?  Yes Type of insurance plan: Medicare  Does the patient qualify for medication assistance through manufacturers or grants?   Yes Eligible grants and/or patient assistance programs: None Medication assistance applications in progress: None  Medication assistance applications approved: None Approved medication assistance renewals will be completed by: N/A  Outpatient Pharmacy:  Current outpatient pharmacy: [facility] Was the [facility] pharmacy used to supply discharge medications? yes  If TOC pharmacy was used, were the refills transferred out to current pharmacy yet? No - needs refills for furosemide and has 1 refill remaining for metoprolol  Is the patient willing to transition their outpatient pharmacy to utilize a [facility] outpatient pharmacy with or without mail order?   {Yes/No/Pending:24180}  Assessment: 1) Chronic systolic CHF (EF <24%), due to NICM. NYHA class *** symptoms. - Consider addition of spironolactone if patient's BP and Scr are stable - Avoid SGLT2i given patient's history of recurrent UTI  Plan: 1) Medication changes: -    2) Patient Assistance: -  3) Follow up: - Next appointment with *** on ***  Corryn Madewell  Earlene Plater, PharmD PGY1 Pharmacy Resident 5/29/202410:27 PM

## 2022-10-29 ENCOUNTER — Other Ambulatory Visit: Payer: Medicare HMO

## 2022-10-29 ENCOUNTER — Ambulatory Visit (HOSPITAL_COMMUNITY)
Admit: 2022-10-29 | Discharge: 2022-10-29 | Disposition: A | Discharge status: Home / Self Care | Payer: Medicare HMO | Attending: Cardiology | Admitting: Cardiology

## 2022-10-29 ENCOUNTER — Encounter (HOSPITAL_COMMUNITY): Payer: Self-pay

## 2022-10-29 ENCOUNTER — Encounter (HOSPITAL_COMMUNITY): Payer: Medicare HMO

## 2022-10-29 VITALS — BP 111/68 | HR 65 | Wt 207.4 lb

## 2022-10-29 DIAGNOSIS — G4733 Obstructive sleep apnea (adult) (pediatric): Secondary | ICD-10-CM | POA: Diagnosis not present

## 2022-10-29 DIAGNOSIS — Z79899 Other long term (current) drug therapy: Secondary | ICD-10-CM | POA: Insufficient documentation

## 2022-10-29 DIAGNOSIS — Z87891 Personal history of nicotine dependence: Secondary | ICD-10-CM | POA: Insufficient documentation

## 2022-10-29 DIAGNOSIS — R002 Palpitations: Secondary | ICD-10-CM | POA: Diagnosis not present

## 2022-10-29 DIAGNOSIS — N179 Acute kidney failure, unspecified: Secondary | ICD-10-CM | POA: Diagnosis not present

## 2022-10-29 DIAGNOSIS — M797 Fibromyalgia: Secondary | ICD-10-CM | POA: Insufficient documentation

## 2022-10-29 DIAGNOSIS — I714 Abdominal aortic aneurysm, without rupture, unspecified: Secondary | ICD-10-CM | POA: Diagnosis not present

## 2022-10-29 DIAGNOSIS — I10 Essential (primary) hypertension: Secondary | ICD-10-CM | POA: Diagnosis not present

## 2022-10-29 DIAGNOSIS — I428 Other cardiomyopathies: Secondary | ICD-10-CM | POA: Diagnosis not present

## 2022-10-29 DIAGNOSIS — I5022 Chronic systolic (congestive) heart failure: Secondary | ICD-10-CM | POA: Insufficient documentation

## 2022-10-29 DIAGNOSIS — I7121 Aneurysm of the ascending aorta, without rupture: Secondary | ICD-10-CM | POA: Diagnosis not present

## 2022-10-29 DIAGNOSIS — I130 Hypertensive heart and chronic kidney disease with heart failure and stage 1 through stage 4 chronic kidney disease, or unspecified chronic kidney disease: Secondary | ICD-10-CM | POA: Insufficient documentation

## 2022-10-29 DIAGNOSIS — I5020 Unspecified systolic (congestive) heart failure: Secondary | ICD-10-CM

## 2022-10-29 DIAGNOSIS — Z8551 Personal history of malignant neoplasm of bladder: Secondary | ICD-10-CM | POA: Insufficient documentation

## 2022-10-29 DIAGNOSIS — I2510 Atherosclerotic heart disease of native coronary artery without angina pectoris: Secondary | ICD-10-CM | POA: Insufficient documentation

## 2022-10-29 DIAGNOSIS — N1831 Chronic kidney disease, stage 3a: Secondary | ICD-10-CM | POA: Diagnosis not present

## 2022-10-29 DIAGNOSIS — L409 Psoriasis, unspecified: Secondary | ICD-10-CM | POA: Diagnosis not present

## 2022-10-29 DIAGNOSIS — I447 Left bundle-branch block, unspecified: Secondary | ICD-10-CM | POA: Diagnosis not present

## 2022-10-29 DIAGNOSIS — Z87440 Personal history of urinary (tract) infections: Secondary | ICD-10-CM | POA: Insufficient documentation

## 2022-10-29 DIAGNOSIS — I5042 Chronic combined systolic (congestive) and diastolic (congestive) heart failure: Secondary | ICD-10-CM | POA: Diagnosis not present

## 2022-10-29 DIAGNOSIS — Z905 Acquired absence of kidney: Secondary | ICD-10-CM | POA: Insufficient documentation

## 2022-10-29 MED ORDER — FUROSEMIDE 20 MG PO TABS: ORAL_TABLET | ORAL | 3 refills | Status: DC

## 2022-10-29 NOTE — Patient Instructions (Signed)
Thank you for your visit today.  No changes to medications.  You have been referred to the electrophysiologist. They will call you to arrange your appointment.  Thank you for allowing [place] to provider your heart failure care after your recent hospitalization. Please follow-up with General Cardiology as scheduled.  If you have any questions, issues, or concerns before your next appointment please call our office at [phone], opt. 2 and leave a message for the triage nurse.  If you have any questions or concerns before your next appointment please send [place] a message through [place] or call our office at [phone].    TO LEAVE A MESSAGE FOR THE NURSE SELECT OPTION 2, PLEASE LEAVE A MESSAGE INCLUDING: YOUR NAME DATE OF BIRTH CALL BACK NUMBER REASON FOR CALL**this is important as we prioritize the call backs  YOU WILL RECEIVE A CALL BACK THE SAME DAY AS LONG AS YOU CALL BEFORE 4:00 PM  At the [facility], you and your health needs are our priority. As part of our continuing mission to provide you with exceptional heart care, we have created designated Provider Care Teams. These Care Teams include your primary Cardiologist (physician) and Advanced Practice Providers (APPs- Physician Assistants and Nurse Practitioners) who all work together to provide you with the care you need, when you need it.   You may see any of the following providers on your designated Care Team at your next follow up: Dr Arvilla Meres Dr Marca Ancona Dr. Marcos Eke, NP Robbie Lis, [place] [facility], [place] Brynda Peon, NP Karle Plumber, PharmD   Please be sure to bring in all your medications bottles to every appointment.    Thank you for choosing [facility]

## 2022-10-30 ENCOUNTER — Encounter: Payer: Self-pay | Admitting: Oncology

## 2022-10-30 LAB — BASIC METABOLIC PANEL
BUN/Creatinine Ratio: 17 (ref 10–24)
BUN: 24 mg/dL (ref 8–27)
CO2: 25 mmol/L (ref 20–29)
Calcium: 10.3 mg/dL — ABNORMAL HIGH (ref 8.6–10.2)
Chloride: 97 mmol/L (ref 96–106)
Creatinine, Ser: 1.44 mg/dL — ABNORMAL HIGH (ref 0.76–1.27)
Glucose: 101 mg/dL — ABNORMAL HIGH (ref 70–99)
Potassium: 4.6 mmol/L (ref 3.5–5.2)
Sodium: 138 mmol/L (ref 134–144)
eGFR: 51 mL/min/{1.73_m2} — ABNORMAL LOW (ref >59)

## 2022-11-02 ENCOUNTER — Telehealth: Payer: Self-pay | Admitting: Family Medicine

## 2022-11-02 NOTE — Telephone Encounter (Signed)
Advised pt of Gs comment , and that we would call as soon as we get the results

## 2022-11-02 NOTE — Telephone Encounter (Signed)
I see it now.  Looks like it has been sent for additional testing.  I have not received any results yet of that additional testing but will call as soon as I get it.

## 2022-11-03 ENCOUNTER — Encounter: Payer: Self-pay | Admitting: Family Medicine

## 2022-11-03 ENCOUNTER — Telehealth: Payer: Self-pay | Admitting: Family Medicine

## 2022-11-03 DIAGNOSIS — E042 Nontoxic multinodular goiter: Secondary | ICD-10-CM

## 2022-11-03 NOTE — Telephone Encounter (Signed)
Called patient to discuss the Afirma test results of the thyroid nodules which were biopsied recently.  The reading was as follows:  1.  Nodule a, obtained from the isthmus that was 2 cm a large was suspicious.  It tested POSITIVE for the BRAF:p:[id number].  Negative for RETIPTC1, RETIPTC3.  This has about a 95% risk of malignancy for PTC  2.  Nodule B, obtained from middle left thyroid measuring 4.2 cm also suspicious.  Tested negative for both the after mentioned genes and has about a 75% risk of malignancy for follicular neoplasm  For this reason I have placed a stat referral to head and neck surgery to have discussion of removal of this thyroid.  I discussed that he may in fact have only partial thyroid removed and then after testing may be offered the remainder of the thyroid to be removed.  Will defer this decision making to the surgeon.  Patient voiced good understanding and appreciation for the quick follow-up on this issue

## 2022-11-04 ENCOUNTER — Telehealth: Payer: Self-pay

## 2022-11-04 ENCOUNTER — Encounter (HOSPITAL_COMMUNITY): Payer: Self-pay

## 2022-11-04 NOTE — Telephone Encounter (Signed)
Spoke to patient recent lab results given.Stated he has been having upper to mid pain across abdomen off and on for the past 2 to 3 days.Stated pain worse today.Advised he needs to see PCP tomorrow.Advised if pain worsens he will need to go to ED.

## 2022-11-05 ENCOUNTER — Ambulatory Visit (INDEPENDENT_AMBULATORY_CARE_PROVIDER_SITE_OTHER): Payer: Medicare HMO | Admitting: Family Medicine

## 2022-11-05 ENCOUNTER — Other Ambulatory Visit: Payer: Medicare HMO

## 2022-11-05 ENCOUNTER — Encounter: Payer: Self-pay | Admitting: Family Medicine

## 2022-11-05 VITALS — BP 101/58 | HR 77 | Temp 97.6°F | Ht 66.0 in | Wt 207.2 lb

## 2022-11-05 DIAGNOSIS — R109 Unspecified abdominal pain: Secondary | ICD-10-CM

## 2022-11-05 DIAGNOSIS — N3001 Acute cystitis with hematuria: Secondary | ICD-10-CM

## 2022-11-05 DIAGNOSIS — N390 Urinary tract infection, site not specified: Secondary | ICD-10-CM

## 2022-11-05 LAB — URINALYSIS, ROUTINE W REFLEX MICROSCOPIC
Bilirubin, UA: NEGATIVE
Glucose, UA: NEGATIVE
Ketones, UA: NEGATIVE
Nitrite, UA: POSITIVE — AB
Protein,UA: NEGATIVE
Specific Gravity, UA: 1.02 (ref 1.005–1.030)
Urobilinogen, Ur: 0.2 mg/dL (ref 0.2–1.0)
pH, UA: 6.5 (ref 5.0–7.5)

## 2022-11-05 LAB — CBC WITH DIFFERENTIAL/PLATELET

## 2022-11-05 LAB — CMP14+EGFR
CO2: 22 mmol/L (ref 20–29)
Globulin, Total: 2.7 g/dL (ref 1.5–4.5)
Total Protein: 7.2 g/dL (ref 6.0–8.5)

## 2022-11-05 LAB — CK ISOENZYMES

## 2022-11-05 LAB — MICROSCOPIC EXAMINATION: Renal Epithel, UA: NONE SEEN /hpf

## 2022-11-05 MED ORDER — SULFAMETHOXAZOLE-TRIMETHOPRIM 800-160 MG PO TABS
1.0000 | ORAL_TABLET | Freq: Two times a day (BID) | ORAL | 0 refills | Status: AC
Start: 2022-11-05 — End: 2022-11-12

## 2022-11-05 NOTE — Progress Notes (Signed)
Subjective:  Patient ID: Billy Rasmussen, male    DOB: [birth year], 77 y.o.   MRN: [id number]  Patient Care Team: Raliegh Ip, DO as PCP - General (Family Medicine) Lennette Bihari, MD as PCP - Cardiology (Cardiology) Berneice Heinrich Delbert Phenix., MD as Consulting Physician (Urology) Elesa Hacker, MD as Referring Physician (Dermatology) Moody Bruins as Physician Assistant (Chiropractic Medicine)   Chief Complaint:  Abdominal Pain (X 2-3 days )   HPI: Billy Rasmussen is a 77 y.o. male presenting on 11/05/2022 for Abdominal Pain (X 2-3 days )   Intermittent abdominal pain for 3 days. States he stopped his multivitamin and statin therapy as he felt this could be contributing to his symptoms. States his urostomy stoma was irritated but has since returned to normal. States the pain has now subsided.   Abdominal Pain This is a new problem. Episode onset: 2-3 days ago. The problem occurs intermittently. The problem has been resolved. The pain is located in the generalized abdominal region. The pain is mild. The quality of the pain is aching and dull. The abdominal pain does not radiate. Associated symptoms include dysuria (has urostomy and stoma was irritated, resolved now). Pertinent negatives include no anorexia, arthralgias, belching, constipation, diarrhea, fever, flatus, frequency, headaches, hematochezia, hematuria, melena, myalgias, nausea, vomiting or weight loss. Nothing aggravates the pain. The pain is relieved by Nothing. He has tried nothing for the symptoms.     Relevant past medical, surgical, family, and social history reviewed and updated as indicated.  Allergies and medications reviewed and updated. Data reviewed: Chart in Epic.   Past Medical History:  Diagnosis Date   Arthritis    knees, shoulders, elbows   Bladder cancer [facility])    urologist-  dr eskridge   Diverticulosis of colon    Fibromyalgia    GERD (gastroesophageal reflux disease)     History of diverticulitis of colon    History of gastric ulcer    due to aleve   Hypertension    Lower urinary tract symptoms (LUTS)    OSA (obstructive sleep apnea)    NON-COMPLIANT  CPAP  --- BUT PT USES OXYGEN AT NIGHT 2.5L VIA [place] (PT'S DECISION)   PONV (postoperative nausea and vomiting)    Psoriasis    Sepsis (HCC)    january 2021   Tinnitus    right ear more, has tranmitter in right ear removable at hs   Wears glasses    Wears partial dentures     Past Surgical History:  Procedure Laterality Date   APPENDECTOMY     COLECTOMY W/ COLOSTOMY  1996   W/   APPENDECTOMY   COLONOSCOPY N/A 05/11/2018   Procedure: COLONOSCOPY;  Surgeon: Corbin Ade, MD;  [facility];  Service: Endoscopy;  Laterality: N/A;  9:30   COLOSTOMY TAKEDOWN  1996   CYSTOSCOPY WITH BIOPSY N/A 12/05/2013   Procedure: CYSTO BLADDER BIOPSY AND FULGERATION;  Surgeon: Jerilee Field, MD;  Location: [facility];  Service: Urology;  Laterality: N/A;   CYSTOSCOPY WITH BIOPSY Bilateral 11/13/2014   Procedure: CYSTOSCOPY WITH  BLADDER BIOPSY FULGERATION AND BILATERAL RETROGRADE PYELOGRAMS;  Surgeon: Jerilee Field, MD;  Location: [facility];  Service: Urology;  Laterality: Bilateral;   CYSTOSCOPY WITH FULGERATION N/A 01/18/2018   Procedure: CYSTOSCOPY WITH FULGERATION/ BLADDER BIOPSY;  Surgeon: Jerilee Field, MD;  Location: [facility];  Service: Urology;  Laterality: N/A;   CYSTOSCOPY WITH INJECTION N/A 11/09/2018  Procedure: CYSTOSCOPY WITH INJECTION OF INDOCYANINE GREEN DYE;  Surgeon: Sebastian Ache, MD;  Location: [facility];  Service: Urology;  Laterality: N/A;   CYSTOSCOPY WITH INSERTION OF UROLIFT N/A 01/18/2018   Procedure: CYSTOSCOPY WITH INSERTION OF UROLIFT;  Surgeon: Jerilee Field, MD;  Location: [facility];  Service: Urology;  Laterality: N/A;   CYSTOSCOPY/URETEROSCOPY/HOLMIUM LASER Left 02/17/2019   Procedure: URETEROSCOPY WITH  BALLOON DILATION  AND NEPHROSTOGRAM;  Surgeon: Sebastian Ache, MD;  Location: [facility];  Service: Urology;  Laterality: Left;  1 HR   EXCISION RIGHT UPPER ARM LIPOMA  2005   HEMORROIDECTOMY     INGUINAL HERNIA REPAIR Left 1984   IR NEPHROSTOMY PLACEMENT LEFT  01/05/2019   KNEE ARTHROSCOPY Left X3  LAST ONE  2002   ORIF LEFT HUMEROUS FX  1976   POLYPECTOMY  05/11/2018   Procedure: POLYPECTOMY;  Surgeon: Corbin Ade, MD;  [facility];  Service: Endoscopy;;   PROSTATE SURGERY     RIGHT HEART CATH AND CORONARY ANGIOGRAPHY N/A 10/09/2022   Procedure: RIGHT HEART CATH AND CORONARY ANGIOGRAPHY;  Surgeon: [name], MD;  [facility];  Service: Cardiovascular;  Laterality: N/A;   ROBOT ASSISTED LAPAROSCOPIC NEPHRECTOMY Left 07/14/2019   Procedure: XI ROBOTIC ASSISTED LAPAROSCOPIC RETROPERITONEAL NEPHRECTOMY;  Surgeon: Sebastian Ache, MD;  Location: [facility];  Service: Urology;  Laterality: Left;  3 HRS   TONSILLECTOMY  AS CHILD   TOTAL KNEE ARTHROPLASTY Left 2006   REVISION 2007  (AFTER I & D WITH ANTIBIOTIC SPACER PROCEDURE FOR STEPH INFECTION)   TOTAL KNEE ARTHROPLASTY Right 03/30/2016   Procedure: RIGHT TOTAL KNEE ARTHROPLASTY;  Surgeon: Durene Romans, MD;  Location: [facility];  Service: Orthopedics;  Laterality: Right;   TOTAL SHOULDER ARTHROPLASTY Right 04/06/2013   Procedure: RIGHT TOTAL SHOULDER ARTHROPLASTY;  Surgeon: Senaida Lange, MD;  Location: [facility];  Service: Orthopedics;  Laterality: Right;   TOTAL SHOULDER ARTHROPLASTY Left 07/20/2013   Procedure: LEFT TOTAL SHOULDER ARTHROPLASTY;  Surgeon: Senaida Lange, MD;  Location: [facility];  Service: Orthopedics;  Laterality: Left;   TRANSURETHRAL RESECTION OF BLADDER TUMOR N/A 11/12/2015   Procedure: TRANSURETHRAL RESECTION OF BLADDER TUMOR (TURBT);  Surgeon: Jerilee Field, MD;  Location: [facility]  Service: Urology;  Laterality: N/A;   TRANSURETHRAL RESECTION OF BLADDER TUMOR  WITH GYRUS (TURBT-GYRUS) N/A 12/05/2013   Procedure: TRANSURETHRAL RESECTION OF BLADDER TUMOR WITH GYRUS (TURBT-GYRUS);  Surgeon: Jerilee Field, MD;  Location: [facility];  Service: Urology;  Laterality: N/A;    Social History   Socioeconomic History   Marital status: Married    Spouse name: Peggy   Number of children: 2   Years of education: 14   Highest education level: Tax adviser degree: occupational, Scientist, product/process development, or vocational program  Occupational History   Occupation: retired    Comment: 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   Tobacco Use   Smoking status: Former    Packs/day: 2.00    Years: 40.00    Additional pack years: 0.00    Total pack years: 80.00    Types: Cigarettes    Quit date: 06/01/1997    Years since quitting: 25.4   Smokeless tobacco: Never  Vaping Use   Vaping Use: Never used  Substance and Sexual Activity   Alcohol use: No   Drug use: No   Sexual activity: Not Currently  Other Topics Concern   Not on file  Social History Narrative   One level home with  wife    51/4640 - 18 year old MIL lives with them   Social Determinants of Health   Financial Resource Strain: Low Risk  (10/15/2022)   Overall Financial Resource Strain (CARDIA)    Difficulty of Paying Living Expenses: Not hard at all  Food Insecurity: No Food Insecurity (10/15/2022)   Hunger Vital Sign    Worried About Running Out of Food in the Last Year: Never true    Ran Out of Food in the Last Year: Never true  Transportation Needs: No Transportation Needs (10/15/2022)   PRAPARE - Administrator, Civil Service (Medical): No    Lack of Transportation (Non-Medical): No  Physical Activity: Inactive (08/14/2022)   Exercise Vital Sign    Days of Exercise per Week: 0 days    Minutes of Exercise per Session: 0 min  Stress: No Stress Concern Present (08/14/2022)   [employer] of Occupational Health - Occupational Stress Questionnaire    Feeling of Stress : Not at all  Social  Connections: Moderately Integrated (08/14/2022)   Social Connection and Isolation Panel [NHANES]    Frequency of Communication with Friends and Family: More than three times a week    Frequency of Social Gatherings with Friends and Family: More than three times a week    Attends Religious Services: More than 4 times per year    Active Member of [employer] or Organizations: No    Attends Banker Meetings: Never    Marital Status: Married  Catering manager Violence: Not At Risk (10/07/2022)   Humiliation, Afraid, Rape, and Kick questionnaire    Fear of Current or Ex-Partner: No    Emotionally Abused: No    Physically Abused: No    Sexually Abused: No    Outpatient Encounter Medications as of 11/05/2022  Medication Sig   benzonatate (TESSALON) 100 MG capsule Take 1 capsule (100 mg total) by mouth 3 (three) times daily as needed for cough.   fluticasone (FLONASE) 50 MCG/ACT nasal spray Place 1 spray into both nostrils daily.   furosemide (LASIX) 20 MG tablet Take 1 tablet (20 mg) daily as needed if you gain more than 3 pounds in 1 day or 5 pounds in 1 week.   levothyroxine (SYNTHROID) 25 MCG tablet Take 1 tablet (25 mcg total) by mouth daily before breakfast. Take on an empty stomach with water only.  Wait to consume other foods/ drinks/ meds for 30 minutes.   losartan (COZAAR) 25 MG tablet Take 1 tablet (25 mg total) by mouth daily.   metoprolol succinate (TOPROL-XL) 25 MG 24 hr tablet Take 1 tablet (25 mg total) by mouth daily.   polyvinyl alcohol (LIQUIFILM TEARS) 1.4 % ophthalmic solution Place 1 drop into both eyes as needed for dry eyes.   sulfamethoxazole-trimethoprim (BACTRIM DS) 800-160 MG tablet Take 1 tablet by mouth 2 (two) times daily for 7 days.   atorvastatin (LIPITOR) 20 MG tablet Take 1 tablet (20 mg total) by mouth daily. (Patient not taking: Reported on 11/05/2022)   No facility-administered encounter medications on file as of 11/05/2022.    No Known Allergies  Review  of Systems  Constitutional:  Negative for activity change, appetite change, chills, fatigue, fever and weight loss.  HENT: Negative.    Eyes: Negative.   Respiratory:  Negative for cough, chest tightness and shortness of breath.   Cardiovascular:  Negative for chest pain, palpitations and leg swelling.  Gastrointestinal:  Positive for abdominal pain. Negative for abdominal distention, anal bleeding,  anorexia, blood in stool, constipation, diarrhea, flatus, hematochezia, melena, nausea, rectal pain and vomiting.  Endocrine: Negative.   Genitourinary:  Positive for dysuria (has urostomy and stoma was irritated, resolved now). Negative for decreased urine volume, flank pain, frequency, genital sores, hematuria, penile discharge, penile pain, penile swelling, scrotal swelling, testicular pain and urgency.  Musculoskeletal:  Negative for arthralgias and myalgias.  Skin: Negative.   Allergic/Immunologic: Negative.   Neurological:  Negative for dizziness, weakness and headaches.  Hematological: Negative.   Psychiatric/Behavioral:  Negative for confusion, hallucinations, sleep disturbance and suicidal ideas.   All other systems reviewed and are negative.       Objective:  BP (!) 101/58   Pulse 77   Temp 97.6 F (36.4 C) (Temporal)   Ht 5\' 6"  (1.676 m)   Wt 207 lb 3.2 oz (94 kg)   SpO2 96%   BMI 33.44 kg/m    Wt Readings from Last 3 Encounters:  11/05/22 207 lb 3.2 oz (94 kg)  10/29/22 207 lb 6 oz (94.1 kg)  10/23/22 203 lb 12.8 oz (92.4 kg)    Physical Exam Vitals and nursing note reviewed.  Constitutional:      General: He is not in acute distress.    Appearance: Normal appearance. He is well-developed. He is obese. He is not ill-appearing, toxic-appearing or diaphoretic.  HENT:     Head: Normocephalic and atraumatic.     Mouth/Throat:     Mouth: Mucous membranes are moist.  Eyes:     Conjunctiva/sclera: Conjunctivae normal.     Pupils: Pupils are equal, round, and reactive  to light.  Cardiovascular:     Rate and Rhythm: Normal rate and regular rhythm.     Heart sounds: No murmur heard.    No friction rub. No gallop.  Pulmonary:     Effort: Pulmonary effort is normal.     Breath sounds: Normal breath sounds. No rhonchi or rales.  Abdominal:     General: Abdomen is protuberant. Bowel sounds are normal. There is no distension or abdominal bruit. There are no signs of injury.     Palpations: Abdomen is soft.     Tenderness: There is no abdominal tenderness.     Comments: Unable to palpate spleen or liver. Urostomy with clear yellow urine, no erythema to stoma.  Musculoskeletal:     Right lower leg: No edema.     Left lower leg: No edema.  Skin:    General: Skin is warm and dry.     Capillary Refill: Capillary refill takes less than 2 seconds.  Neurological:     General: No focal deficit present.     Mental Status: He is alert and oriented to person, place, and time.  Psychiatric:        Mood and Affect: Mood normal.        Behavior: Behavior normal.        Thought Content: Thought content normal.        Judgment: Judgment normal.     Results for orders placed or performed in visit on 10/23/22  Basic metabolic panel  Result Value Ref Range   Glucose 101 (H) 70 - 99 mg/dL   BUN 24 8 - 27 mg/dL   Creatinine, Ser 8.29 (H) 0.76 - 1.27 mg/dL   eGFR 51 (L) >56 OZ/HYQ/6.57   BUN/Creatinine Ratio 17 10 - 24   Sodium 138 134 - 144 mmol/L   Potassium 4.6 3.5 - 5.2 mmol/L   Chloride 97 96 -  106 mmol/L   CO2 25 20 - 29 mmol/L   Calcium 10.3 (H) 8.6 - 10.2 mg/dL       Pertinent labs & imaging results that were available during my care of the patient were reviewed by me and considered in my medical decision making.  Assessment & Plan:  Davi was seen today for abdominal pain.  Diagnoses and all orders for this visit:  Abdominal discomfort Intermittent for 3 days, resoled today. Will check for possible statin myopathies as he stopped statin and  symptoms resolved. Will check for elevated WBC or electrolyte disturbances. Will check urinalysis. No red flags concerning for acute processes. Pt aware to report new, worsening, or persistent symptoms.  -     CMP14+EGFR -     CBC with Differential/Platelet -     CK isoenzymes (brain, muscle injury) -     Urinalysis, Routine w reflex microscopic -     Urine Culture  Acute cystitis with hematuria Urinalysis in office revealed trace blood, 2+ leukocytes, and positive nitrites. Will initiate below. Culture pending, will change therapy if warranted.  -     sulfamethoxazole-trimethoprim (BACTRIM DS) 800-160 MG tablet; Take 1 tablet by mouth 2 (two) times daily for 7 days.     Continue all other maintenance medications.  Follow up plan: Return if symptoms worsen or fail to improve.   Continue healthy lifestyle choices, including diet (rich in fruits, vegetables, and lean proteins, and low in salt and simple carbohydrates) and exercise (at least 30 minutes of moderate physical activity daily).    The above assessment and management plan was discussed with the patient. The patient verbalized understanding of and has agreed to the management plan. Patient is aware to call the clinic if they develop any new symptoms or if symptoms persist or worsen. Patient is aware when to return to the clinic for a follow-up visit. Patient educated on when it is appropriate to go to the emergency department.   Kari Baars, [name]-C [facility] [phone]

## 2022-11-06 ENCOUNTER — Encounter: Payer: Self-pay | Admitting: Oncology

## 2022-11-06 LAB — CBC WITH DIFFERENTIAL/PLATELET
Basos: 0 %
Eos: 5 %
Hematocrit: 43.3 % (ref 37.5–51.0)
Hemoglobin: 14.2 g/dL (ref 13.0–17.7)
Lymphs: 23 %
MCV: 90 fL (ref 79–97)
Monocytes Absolute: 0.4 10*3/uL (ref 0.1–0.9)
Monocytes: 8 %
Neutrophils Absolute: 3.4 10*3/uL (ref 1.4–7.0)
Neutrophils: 64 %
RDW: 13.8 % (ref 11.6–15.4)

## 2022-11-06 LAB — CK ISOENZYMES

## 2022-11-06 LAB — CMP14+EGFR
AST: 48 IU/L — ABNORMAL HIGH (ref 0–40)
Alkaline Phosphatase: 66 IU/L (ref 44–121)
BUN: 20 mg/dL (ref 8–27)
Potassium: 4.2 mmol/L (ref 3.5–5.2)
eGFR: 47 mL/min/{1.73_m2} — ABNORMAL LOW (ref >59)

## 2022-11-06 NOTE — Telephone Encounter (Signed)
Spoke to patient he stated he forgot what I told him about 5/30 bmet results.Advised bmet results stable.Stated he is feeling better today.He saw PCP.He has a UTI.He started taking a antibiotic.

## 2022-11-06 NOTE — Telephone Encounter (Signed)
Pt calling back. He states he thinks he may have missed something when going over lab results and would like a c/b to review again.

## 2022-11-08 LAB — URINE CULTURE

## 2022-11-09 ENCOUNTER — Other Ambulatory Visit (HOSPITAL_COMMUNITY): Payer: Self-pay

## 2022-11-10 ENCOUNTER — Other Ambulatory Visit: Payer: Self-pay | Admitting: Medical Oncology

## 2022-11-10 ENCOUNTER — Ambulatory Visit (INDEPENDENT_AMBULATORY_CARE_PROVIDER_SITE_OTHER): Payer: Medicare HMO | Admitting: Neurology

## 2022-11-10 ENCOUNTER — Encounter: Payer: Self-pay | Admitting: Neurology

## 2022-11-10 VITALS — BP 118/64 | HR 67 | Ht 66.0 in | Wt 208.8 lb

## 2022-11-10 DIAGNOSIS — I5042 Chronic combined systolic (congestive) and diastolic (congestive) heart failure: Secondary | ICD-10-CM | POA: Diagnosis not present

## 2022-11-10 DIAGNOSIS — D696 Thrombocytopenia, unspecified: Secondary | ICD-10-CM

## 2022-11-10 DIAGNOSIS — G4734 Idiopathic sleep related nonobstructive alveolar hypoventilation: Secondary | ICD-10-CM | POA: Diagnosis not present

## 2022-11-10 DIAGNOSIS — G4733 Obstructive sleep apnea (adult) (pediatric): Secondary | ICD-10-CM

## 2022-11-10 DIAGNOSIS — Z9981 Dependence on supplemental oxygen: Secondary | ICD-10-CM | POA: Diagnosis not present

## 2022-11-10 DIAGNOSIS — E669 Obesity, unspecified: Secondary | ICD-10-CM | POA: Diagnosis not present

## 2022-11-10 LAB — URINE CULTURE

## 2022-11-10 MED ORDER — FOSFOMYCIN TROMETHAMINE 3 G PO PACK
3.0000 g | PACK | ORAL | 0 refills | Status: AC
Start: 2022-11-10 — End: 2022-11-14

## 2022-11-10 NOTE — Patient Instructions (Signed)

## 2022-11-10 NOTE — Addendum Note (Signed)
Addended by: Sonny Masters on: 11/10/2022 10:59 AM   Modules accepted: Orders

## 2022-11-10 NOTE — Progress Notes (Signed)
Subjective:    Patient ID: Billy Rasmussen is a 77 y.o. male.  HPI    Huston Foley, MD, PhD [facility] [address] [address] [place], [place] [address]  Dear Dr. Nadine Counts,  I saw your patient, Billy Rasmussen, upon your kind request in my sleep clinic today for initial consultation of his sleep disorder, in particular, concern for underlying obstructive sleep apnea.  The patient is unaccompanied today.  As you know, Billy Rasmussen is a 77 year old male with an underlying complex medical history of chronic systolic and diastolic congestive heart failure, bladder cancer, diverticulitis, history of gastric ulcer, reflux disease, thyroid nodule, arthritis, fibromyalgia, psoriasis, history of sepsis, tinnitus, and obesity, multiple surgeries including left humerus ORIF, tonsillectomy, appendectomy, prostate surgery, bladder cancer surgery, ostomy placement, colostomy placement with subsequent reversal, bilateral knee replacements, right shoulder replacement, who reports snoring and excessive daytime somnolence. His Epworth sleepiness score is 9 of 24, fatigue severity score is 35, pulse 63. He was previously diagnosed with obstructive sleep apnea but has not been on PAP therapy in several years.  He reports discomfort with the mask and the machine.  He has been on home oxygen therapy at night for years.  He had a recent abnormal pulse oximetry test which was abnormal.  I was able to review his pulse oximetry test results which reportedly was on room air on CPAP per report.  He reports that he was not using CPAP at night.  In fact, he no longer has a machine.  He was tried on 2 different machines as he recalls and tried multiple different mask but found all of the masks uncomfortable.  He is not opposed to getting tested again and considering PAP therapy again.  His ONO test date was 09/04/2022 through 09/05/2022, total duration of test time 6 hours and 53 minutes, average  oxygen saturation 93%, nadir was 77%, time below or at 88% saturation was 53 minutes and 43 seconds. Prior sleep study results are not available for my review today.  Testing was at [facility] in 2005 per patient.  He reports that his current provider, [facility], will no longer be able to give him his oxygen due to insurance.  His bedtime is around 10:30 PM and rise time currently around 5.  They have 2 cats and 1 dog in the household, neither pet sleeps in the bedroom with them.  He lives with his wife.  He lost about 20 pounds in the past few weeks, mostly due to decreased swelling.  He is followed by cardiology for his congestive heart failure.  He has a urine pouch, he attaches it to nighttime bag so he does not have to empty the pouch in the middle of the night.  He quit smoking in 1999, he limits his caffeine to 2 cups of coffee, occasional tea, he does not drink any alcohol currently.  He is supposed to have further workup for thyroid nodules and possible thyroidectomy.  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  His Past Medical History Is Significant For: Past Medical History:  Diagnosis Date   Arthritis    knees, shoulders, elbows   Bladder cancer [facility])    urologist-  dr Mena Goes   CHF (congestive heart failure) (HCC)    Diverticulosis of colon    Fibromyalgia    GERD (gastroesophageal reflux disease)    History of diverticulitis  of colon    History of gastric ulcer    due to aleve   Hypertension    Lower urinary tract symptoms (LUTS)    OSA (obstructive sleep apnea)    NON-COMPLIANT  CPAP  --- BUT PT USES OXYGEN AT NIGHT 2.5L VIA [place] (PT'S DECISION)   PONV (postoperative nausea and vomiting)    Psoriasis    Sepsis (HCC)    january 2021   Tinnitus    right ear more, has tranmitter in right ear removable at hs   Wears glasses    Wears partial dentures     His Past Surgical History Is  Significant For: Past Surgical History:  Procedure Laterality Date   APPENDECTOMY     COLECTOMY W/ COLOSTOMY  1996   W/   APPENDECTOMY   COLONOSCOPY N/A 05/11/2018   Procedure: COLONOSCOPY;  Surgeon: Corbin Ade, MD;  [facility];  Service: Endoscopy;  Laterality: N/A;  9:30   COLOSTOMY TAKEDOWN  1996   CYSTOSCOPY WITH BIOPSY N/A 12/05/2013   Procedure: CYSTO BLADDER BIOPSY AND FULGERATION;  Surgeon: Jerilee Field, MD;  Location: [facility];  Service: Urology;  Laterality: N/A;   CYSTOSCOPY WITH BIOPSY Bilateral 11/13/2014   Procedure: CYSTOSCOPY WITH  BLADDER BIOPSY FULGERATION AND BILATERAL RETROGRADE PYELOGRAMS;  Surgeon: Jerilee Field, MD;  Location: [facility];  Service: Urology;  Laterality: Bilateral;   CYSTOSCOPY WITH FULGERATION N/A 01/18/2018   Procedure: CYSTOSCOPY WITH FULGERATION/ BLADDER BIOPSY;  Surgeon: Jerilee Field, MD;  [facility];  Service: Urology;  Laterality: N/A;   CYSTOSCOPY WITH INJECTION N/A 11/09/2018   Procedure: CYSTOSCOPY WITH INJECTION OF INDOCYANINE GREEN DYE;  Surgeon: Sebastian Ache, MD;  Location: [facility];  Service: Urology;  Laterality: N/A;   CYSTOSCOPY WITH INSERTION OF UROLIFT N/A 01/18/2018   Procedure: CYSTOSCOPY WITH INSERTION OF UROLIFT;  Surgeon: Jerilee Field, MD;  Location[facility];  Service: Urology;  Laterality: N/A;   CYSTOSCOPY/URETEROSCOPY/HOLMIUM LASER Left 02/17/2019   Procedure: URETEROSCOPY WITH BALLOON DILATION  AND NEPHROSTOGRAM;  Surgeon: Sebastian Ache, MD;  Location: [facility];  Service: Urology;  Laterality: Left;  1 HR   EXCISION RIGHT UPPER ARM LIPOMA  2005   HEMORROIDECTOMY     INGUINAL HERNIA REPAIR Left 1984   IR NEPHROSTOMY PLACEMENT LEFT  01/05/2019   KNEE ARTHROSCOPY Left X3  LAST ONE  2002   ORIF LEFT HUMEROUS FX  1976   POLYPECTOMY  05/11/2018   Procedure: POLYPECTOMY;  Surgeon: Corbin Ade, MD;   [facility];  Service: Endoscopy;;   PROSTATE SURGERY     RIGHT HEART CATH AND CORONARY ANGIOGRAPHY N/A 10/09/2022   Procedure: RIGHT HEART CATH AND CORONARY ANGIOGRAPHY;  Surgeon: [name], MD;  [facility];  Service: Cardiovascular;  Laterality: N/A;   ROBOT ASSISTED LAPAROSCOPIC NEPHRECTOMY Left 07/14/2019   Procedure: XI ROBOTIC ASSISTED LAPAROSCOPIC RETROPERITONEAL NEPHRECTOMY;  Surgeon: Sebastian Ache, MD;  Location: [facility];  Service: Urology;  Laterality: Left;  3 HRS   TONSILLECTOMY  AS CHILD   TOTAL KNEE ARTHROPLASTY Left 2006   REVISION 2007  (AFTER I & D WITH ANTIBIOTIC SPACER PROCEDURE FOR STEPH INFECTION)   TOTAL KNEE ARTHROPLASTY Right 03/30/2016   Procedure: RIGHT TOTAL KNEE ARTHROPLASTY;  Surgeon: Durene Romans, MD;  Location: [facility];  Service: Orthopedics;  Laterality: Right;   TOTAL SHOULDER ARTHROPLASTY Right 04/06/2013   Procedure: RIGHT TOTAL SHOULDER ARTHROPLASTY;  Surgeon: Vania Rea  Supple, MD;  Location: [facility];  Service: Orthopedics;  Laterality: Right;   TOTAL SHOULDER ARTHROPLASTY Left 07/20/2013   Procedure: LEFT TOTAL SHOULDER ARTHROPLASTY;  Surgeon: Senaida Lange, MD;  Location: [facility];  Service: Orthopedics;  Laterality: Left;   TRANSURETHRAL RESECTION OF BLADDER TUMOR N/A 11/12/2015   Procedure: TRANSURETHRAL RESECTION OF BLADDER TUMOR (TURBT);  Surgeon: Jerilee Field, MD;  Location: [facility];  Service: Urology;  Laterality: N/A;   TRANSURETHRAL RESECTION OF BLADDER TUMOR WITH GYRUS (TURBT-GYRUS) N/A 12/05/2013   Procedure: TRANSURETHRAL RESECTION OF BLADDER TUMOR WITH GYRUS (TURBT-GYRUS);  Surgeon: Jerilee Field, MD;  Location: [facility];  Service: Urology;  Laterality: N/A;    His Family History Is Significant For: Family History  Problem Relation Age of Onset   Alzheimer's disease Mother    Heart attack Father    Heart disease Father    Irregular heart beat Brother        DEFIB.    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   Retired, Visual merchandiser   Social Determinants of Corporate investment banker Strain: Low Risk  (10/15/2022)   Overall Financial Resource Strain (CARDIA)    Difficulty of Paying Living Expenses: Not hard at all  Food Insecurity: No Food Insecurity (10/15/2022)   Hunger Vital Sign    Worried About Running Out of Food in the Last Year: Never true    Ran Out of Food in the Last Year: Never true  Transportation Needs: No Transportation Needs (10/15/2022)   PRAPARE - Administrator, Civil Service (Medical): No    Lack of Transportation (Non-Medical): No  Physical Activity: Inactive (08/14/2022)   Exercise Vital Sign    Days of Exercise per Week: 0 days    Minutes of Exercise per Session: 0 min  Stress: No Stress Concern Present (08/14/2022)   [employer] of Occupational Health - Occupational Stress Questionnaire    Feeling of Stress : Not at all  Social Connections: Moderately Integrated (08/14/2022)   Social Connection and  Isolation Panel [NHANES]    Frequency of Communication with Friends and Family: More than three times a week    Frequency of Social Gatherings with Friends and Family: More than three times a week    Attends Religious Services: More than 4 times per year    Active Member of [employer]  or Organizations: No    Attends Banker Meetings: Never    Marital Status: Married    His Allergies Are:  No Known Allergies:   His Current Medications Are:  Outpatient Encounter Medications as of 11/10/2022  Medication Sig   atorvastatin (LIPITOR) 20 MG tablet Take 1 tablet (20 mg total) by mouth daily.   benzonatate (TESSALON) 100 MG capsule Take 1 capsule (100 mg total) by mouth 3 (three) times daily as needed for cough.   fluticasone (FLONASE) 50 MCG/ACT nasal spray Place 1 spray into both nostrils daily.   furosemide (LASIX) 20 MG tablet Take 1 tablet (20 mg) daily as needed if you gain more than 3 pounds in 1 day or 5 pounds in 1 week.   levothyroxine (SYNTHROID) 25 MCG tablet Take 1 tablet (25 mcg total) by mouth daily before breakfast. Take on an empty stomach with water only.  Wait to consume other foods/ drinks/ meds for 30 minutes.   losartan (COZAAR) 25 MG tablet Take 1 tablet (25 mg total) by mouth daily.   metoprolol succinate (TOPROL-XL) 25 MG 24 hr tablet Take 1 tablet (25 mg total) by mouth daily.   polyvinyl alcohol (LIQUIFILM TEARS) 1.4 % ophthalmic solution Place 1 drop into both eyes as needed for dry eyes.   sulfamethoxazole-trimethoprim (BACTRIM DS) 800-160 MG tablet Take 1 tablet by mouth 2 (two) times daily for 7 days.   No facility-administered encounter medications on file as of 11/10/2022.  :   Review of Systems:  Out of a complete 14 point review of systems, all are reviewed and negative with the exception of these symptoms as listed below:  Review of Systems  Neurological:        Snoring.  Daytime fatigue.  Diagnosed with OSA 2005, [place]/ now [facility] (sleep study)   used pap for a yr, could not tolerate mask.  Since has been on nocturnal oxygen 2LNC. (On and off since insurance changes).  Sleeps in recliner.  Stated had ONO 09/2022 by pcp.  Was not given results.  ESS 9,  FSS  35.      Objective:  Neurological Exam  Physical Exam Physical Examination:   Vitals:   11/10/22 0911  BP: 118/64  Pulse: 67    General Examination: The patient is a very pleasant 77 y.o. male in no acute distress. He appears well-developed and well-nourished and well groomed.   HEENT: Normocephalic, atraumatic, pupils are equal, round and reactive to light, extraocular tracking is good without limitation to gaze excursion or nystagmus noted. Hearing is grossly intact. Face is symmetric with normal facial animation. Speech is clear with no dysarthria noted. There is no hypophonia. There is no lip, neck/head, jaw or voice tremor. Neck is supple with full range of passive and active motion. There are no carotid bruits on auscultation. Oropharynx exam reveals: moderate mouth dryness, adequate dental hygiene w partial dentures top and bottom, mild airway crowding due to small airway entry, tonsils absent, Mallampati class II.  Tongue protrudes centrally and palate elevates symmetrically.  Neck circumference 17-7/8 inches.   Chest: Clear to auscultation without wheezing, rhonchi or crackles noted.  Heart: S1+S2+0, regular and normal without murmurs, rubs or gallops noted.   Abdomen: Soft, non-tender and non-distended.  Extremities: There is no pitting edema in the distal lower extremities bilaterally.   Skin: Warm and dry without trophic changes noted.   Musculoskeletal: exam reveals no obvious joint deformities.   Neurologically:  Mental status: The patient is awake,  alert and oriented in all 4 spheres. His immediate and remote memory, attention, language skills and fund of knowledge are appropriate. There is no evidence of aphasia, agnosia, apraxia or anomia. Speech is clear  with normal prosody and enunciation. Thought process is linear. Mood is normal and affect is normal.  Cranial nerves II - XII are as described above under HEENT exam.  Motor exam: Normal bulk, strength and tone is noted. There is no obvious action or resting tremor.  Fine motor skills and coordination: grossly intact.  Cerebellar testing: No dysmetria or intention tremor. There is no truncal or gait ataxia.  Sensory exam: intact to light touch in the upper and lower extremities.  Gait, station and balance: He stands easily. No veering to one side is noted. No leaning to one side is noted. Posture is age-appropriate and stance is narrow based. Gait shows normal stride length and normal pace. No problems turning are noted.   Assessment and Plan:  In summary, Billy Rasmussen is a very pleasant 77 y.o.-year old male with an underlying complex medical history of chronic systolic and diastolic congestive heart failure, bladder cancer, diverticulitis, history of gastric ulcer, reflux disease, thyroid nodule, arthritis, fibromyalgia, psoriasis, history of sepsis, tinnitus, and obesity, multiple surgeries including left humerus ORIF, tonsillectomy, appendectomy, prostate surgery, bladder cancer surgery, ostomy placement, colostomy placement with subsequent reversal, bilateral knee replacements, right shoulder replacement, who presents for evaluation of his obstructive sleep apnea.  He was diagnosed over 15 years ago, nearly 20 years ago and had a PAP machine in the past but has been on home oxygen treatment for years.  He would be willing to get reevaluated and consider PAP therapy again.  A laboratory attended sleep study is typically considered "gold standard" for evaluation of sleep disordered breathing.   I had a long chat with the patient about my findings and the diagnosis of sleep apnea, particularly OSA, its prognosis and treatment options. We talked about medical/conservative treatments, surgical  interventions and non-pharmacological approaches for symptom control. I explained, in particular, the risks and ramifications of untreated moderate to severe OSA, especially with respect to developing cardiovascular disease down the road, including congestive heart failure (CHF), difficult to treat hypertension, cardiac arrhythmias (particularly A-fib), neurovascular complications including TIA, stroke and dementia. Even type 2 diabetes has, in part, been linked to untreated OSA. Symptoms of untreated OSA may include (but may not be limited to) daytime sleepiness, nocturia (i.e. frequent nighttime urination), memory problems, mood irritability and suboptimally controlled or worsening mood disorder such as depression and/or anxiety, lack of energy, lack of motivation, physical discomfort, as well as recurrent headaches, especially morning or nocturnal headaches. We talked about the importance of maintaining a healthy lifestyle and striving for healthy weight. I recommended a sleep study at this time. I outlined the differences between a laboratory attended sleep study which is considered more comprehensive and accurate over the option of a home sleep test (HST); the latter may lead to underestimation of sleep disordered breathing in some instances and does not help with diagnosing upper airway resistance syndrome and is not accurate enough to diagnose primary central sleep apnea typically. I outlined possible surgical and non-surgical treatment options of OSA, including the use of a positive airway pressure (PAP) device (i.e. CPAP, AutoPAP/APAP or BiPAP in certain circumstances), a custom-made dental device (aka oral appliance, which would require a referral to a specialist dentist or orthodontist typically, and is generally speaking not considered for patients with full dentures or  edentulous state), upper airway surgical options, such as traditional UPPP (which is not considered a first-line treatment) or the  Inspire device (hypoglossal nerve stimulator, which would involve a referral for consultation with an ENT surgeon, after careful selection, following inclusion criteria - also not first-line treatment). I explained the PAP treatment option to the patient in detail, as this is generally considered first-line treatment.  The patient indicated that he would be willing to try PAP therapy, if the need arises. I explained the importance of being compliant with PAP treatment, not only for insurance purposes but primarily to improve patient's symptoms symptoms, and for the patient's long term health benefit, including to reduce His cardiovascular risks longer-term.    We will pick up our discussion about the next steps and treatment options after testing.  We will keep him posted as to the test results by phone call and/or MyChart messaging where possible.  We will plan to follow-up in sleep clinic accordingly as well.  I answered all his questions today and the patient was in agreement.   I encouraged him to call with any interim questions, concerns, problems or updates or email [place] through MyChart.  Generally speaking, sleep test authorizations may take up to 2 weeks, sometimes less, sometimes longer, the patient is encouraged to get in touch with [place] if they do not hear back from the sleep lab staff directly within the next 2 weeks.  Thank you very much for allowing me to participate in the care of this nice patient. If I can be of any further assistance to you please do not hesitate to call me at [phone].  Sincerely,   Huston Foley, MD, PhD

## 2022-11-11 ENCOUNTER — Inpatient Hospital Stay: Payer: Medicare HMO

## 2022-11-11 ENCOUNTER — Other Ambulatory Visit: Payer: Self-pay

## 2022-11-11 ENCOUNTER — Telehealth: Payer: Self-pay | Admitting: Family Medicine

## 2022-11-11 ENCOUNTER — Encounter: Payer: Self-pay | Admitting: Oncology

## 2022-11-11 ENCOUNTER — Inpatient Hospital Stay: Payer: Medicare HMO | Attending: Internal Medicine | Admitting: Internal Medicine

## 2022-11-11 ENCOUNTER — Other Ambulatory Visit: Payer: Self-pay | Admitting: Internal Medicine

## 2022-11-11 VITALS — BP 120/78 | HR 66 | Temp 98.0°F | Resp 18 | Wt 209.0 lb

## 2022-11-11 DIAGNOSIS — E042 Nontoxic multinodular goiter: Secondary | ICD-10-CM | POA: Insufficient documentation

## 2022-11-11 DIAGNOSIS — R634 Abnormal weight loss: Secondary | ICD-10-CM | POA: Diagnosis not present

## 2022-11-11 DIAGNOSIS — Z8551 Personal history of malignant neoplasm of bladder: Secondary | ICD-10-CM | POA: Insufficient documentation

## 2022-11-11 DIAGNOSIS — R161 Splenomegaly, not elsewhere classified: Secondary | ICD-10-CM

## 2022-11-11 DIAGNOSIS — R0609 Other forms of dyspnea: Secondary | ICD-10-CM | POA: Diagnosis not present

## 2022-11-11 DIAGNOSIS — I509 Heart failure, unspecified: Secondary | ICD-10-CM | POA: Diagnosis not present

## 2022-11-11 DIAGNOSIS — R5383 Other fatigue: Secondary | ICD-10-CM | POA: Diagnosis not present

## 2022-11-11 DIAGNOSIS — D696 Thrombocytopenia, unspecified: Secondary | ICD-10-CM

## 2022-11-11 DIAGNOSIS — E875 Hyperkalemia: Secondary | ICD-10-CM | POA: Diagnosis not present

## 2022-11-11 DIAGNOSIS — I110 Hypertensive heart disease with heart failure: Secondary | ICD-10-CM | POA: Diagnosis not present

## 2022-11-11 LAB — CMP (CANCER CENTER ONLY)
ALT: 25 U/L (ref 0–44)
AST: 43 U/L — ABNORMAL HIGH (ref 15–41)
Albumin: 4.3 g/dL (ref 3.5–5.0)
Alkaline Phosphatase: 61 U/L (ref 38–126)
Anion gap: 7 (ref 5–15)
BUN: 22 mg/dL (ref 8–23)
CO2: 26 mmol/L (ref 22–32)
Calcium: 10.4 mg/dL — ABNORMAL HIGH (ref 8.9–10.3)
Chloride: 102 mmol/L (ref 98–111)
Creatinine: 1.47 mg/dL — ABNORMAL HIGH (ref 0.61–1.24)
GFR, Estimated: 49 mL/min — ABNORMAL LOW (ref >60)
Glucose, Bld: 96 mg/dL (ref 70–99)
Potassium: 4.3 mmol/L (ref 3.5–5.1)
Sodium: 135 mmol/L (ref 135–145)
Total Bilirubin: 0.8 mg/dL (ref 0.3–1.2)
Total Protein: 7.9 g/dL (ref 6.5–8.1)

## 2022-11-11 LAB — CBC WITH DIFFERENTIAL (CANCER CENTER ONLY)
Abs Immature Granulocytes: 0.02 10*3/uL (ref 0.00–0.07)
Basophils Absolute: 0 10*3/uL (ref 0.0–0.1)
Basophils Relative: 0 %
Eosinophils Absolute: 0.2 10*3/uL (ref 0.0–0.5)
Eosinophils Relative: 3 %
HCT: 40.8 % (ref 39.0–52.0)
Hemoglobin: 14 g/dL (ref 13.0–17.0)
Immature Granulocytes: 0 %
Lymphocytes Relative: 22 %
Lymphs Abs: 1.3 10*3/uL (ref 0.7–4.0)
MCH: 30.6 pg (ref 26.0–34.0)
MCHC: 34.3 g/dL (ref 30.0–36.0)
MCV: 89.1 fL (ref 80.0–100.0)
Monocytes Absolute: 0.6 10*3/uL (ref 0.1–1.0)
Monocytes Relative: 10 %
Neutro Abs: 3.9 10*3/uL (ref 1.7–7.7)
Neutrophils Relative %: 65 %
Platelet Count: 116 10*3/uL — ABNORMAL LOW (ref 150–400)
RBC: 4.58 MIL/uL (ref 4.22–5.81)
RDW: 14.1 % (ref 11.5–15.5)
WBC Count: 5.9 10*3/uL (ref 4.0–10.5)
nRBC: 0 % (ref 0.0–0.2)

## 2022-11-11 LAB — CMP14+EGFR
ALT: 30 IU/L (ref 0–44)
Albumin/Globulin Ratio: 1.7 (ref 1.2–2.2)
Albumin: 4.5 g/dL (ref 3.8–4.8)
BUN/Creatinine Ratio: 13 (ref 10–24)
Bilirubin Total: 0.6 mg/dL (ref 0.0–1.2)
Calcium: 10.3 mg/dL — ABNORMAL HIGH (ref 8.6–10.2)
Chloride: 99 mmol/L (ref 96–106)
Creatinine, Ser: 1.52 mg/dL — ABNORMAL HIGH (ref 0.76–1.27)
Glucose: 106 mg/dL — ABNORMAL HIGH (ref 70–99)
Sodium: 138 mmol/L (ref 134–144)

## 2022-11-11 LAB — CK ISOENZYMES
CK-MB: 0 % (ref 0–3)
Macro Type 1: 0 %
Macro Type 2: 0 %

## 2022-11-11 LAB — HEPATITIS PANEL, ACUTE
HCV Ab: NONREACTIVE
Hep A IgM: NONREACTIVE
Hep B C IgM: NONREACTIVE
Hepatitis B Surface Ag: NONREACTIVE

## 2022-11-11 LAB — CBC WITH DIFFERENTIAL/PLATELET
Basophils Absolute: 0 10*3/uL (ref 0.0–0.2)
EOS (ABSOLUTE): 0.3 10*3/uL (ref 0.0–0.4)
Immature Granulocytes: 0 %
Lymphocytes Absolute: 1.2 10*3/uL (ref 0.7–3.1)
MCH: 29.3 pg (ref 26.6–33.0)
MCHC: 32.8 g/dL (ref 31.5–35.7)
Platelets: 117 10*3/uL — ABNORMAL LOW (ref 150–450)
RBC: 4.84 x10E6/uL (ref 4.14–5.80)

## 2022-11-11 LAB — SEDIMENTATION RATE: Sed Rate: 10 mm/hr (ref 0–16)

## 2022-11-11 LAB — LACTATE DEHYDROGENASE: LDH: 127 U/L (ref 98–192)

## 2022-11-11 NOTE — Progress Notes (Signed)
[facility] [phone]   [phone]  CONSULT NOTE  REFERRING PHYSICIAN: Dr. Doylene Canard  REASON FOR CONSULTATION:  77 years old white male with a splenomegaly.  HPI Billy Rasmussen is a 77 y.o. male with past medical history significant for multiple medical problems including history of bladder cancer status post cystectomy with creation of ileocecal conduit and colostomy.  The patient also has history of congestive heart failure, osteoarthritis, fibromyalgia, diverticulosis, GERD, history of gastric ulcer, psoriasis, sepsis, obstructive sleep apnea, recurrent urinary tract infection as well as hypertension.  The patient was admitted to the hospital few weeks ago with acute on chronic systolic congestive heart failure as well as acute kidney injury and urinary tract infection.  During his evaluation he had CT renal stone study on 10/12/2022 and incidentally it showed enlarged spleen measuring 13.6 cm in maximum diameter.  The patient was referred to me today for evaluation and recommendation regarding his splenomegaly.  He also was found on previous imaging studies to have suspicious thyroid nodules and he underwent ultrasound guided needle aspiration of the isthmus nodule and that showed atypical cells.  He was referred to Dr. Pollyann Kennedy with ENT for evaluation. When seen today the patient is feeling fine except for fatigue and shortness of breath with exertion.  He also lost more than 10 pounds secondary to diuresis for the congestive heart failure.  He denied having any current chest pain but has shortness of breath with exertion with no cough or hemoptysis.  He has no nausea, vomiting, diarrhea or constipation.  He has no headache or visual changes. Family history significant for father with heart disease.  Mother had Alzheimer.  Brother had congestive heart failure and daughter has diabetes. The patient is married and has 2 daughters and 4 stepchildren.   He worked in several jobs in the past but currently runs an [employer].  He has a history of smoking but quit in 1989.  He has no history of alcohol or drug abuse.  He was accompanied today by his wife Billy Rasmussen and his daughter Billy Rasmussen. HPI  Past Medical History:  Diagnosis Date   Arthritis    knees, shoulders, elbows   Bladder cancer [facility])    urologist-  dr Mena Goes   CHF (congestive heart failure) (HCC)    Diverticulosis of colon    Fibromyalgia    GERD (gastroesophageal reflux disease)    History of diverticulitis of colon    History of gastric ulcer    due to aleve   Hypertension    Lower urinary tract symptoms (LUTS)    OSA (obstructive sleep apnea)    NON-COMPLIANT  CPAP  --- BUT PT USES OXYGEN AT NIGHT 2.5L VIA [place] (PT'S DECISION)   PONV (postoperative nausea and vomiting)    Psoriasis    Sepsis (HCC)    january 2021   Tinnitus    right ear more, has tranmitter in right ear removable at hs   Wears glasses    Wears partial dentures     Past Surgical History:  Procedure Laterality Date   APPENDECTOMY     COLECTOMY W/ COLOSTOMY  1996   W/   APPENDECTOMY   COLONOSCOPY N/A 05/11/2018   Procedure: COLONOSCOPY;  Surgeon: Corbin Ade, MD;  [facility];  Service: Endoscopy;  Laterality: N/A;  9:30   COLOSTOMY TAKEDOWN  1996   CYSTOSCOPY WITH BIOPSY N/A 12/05/2013   Procedure: CYSTO BLADDER BIOPSY AND FULGERATION;  Surgeon: Jerilee Field, MD;  Location[facility];  Service: Urology;  Laterality: N/A;   CYSTOSCOPY WITH BIOPSY Bilateral 11/13/2014   Procedure: CYSTOSCOPY WITH  BLADDER BIOPSY FULGERATION AND BILATERAL RETROGRADE PYELOGRAMS;  Surgeon: Jerilee Field, MD;  Location: [facility];  Service: Urology;  Laterality: Bilateral;   CYSTOSCOPY WITH FULGERATION N/A 01/18/2018   Procedure: CYSTOSCOPY WITH FULGERATION/ BLADDER BIOPSY;  Surgeon: Jerilee Field, MD;  [facility];  Service:  Urology;  Laterality: N/A;   CYSTOSCOPY WITH INJECTION N/A 11/09/2018   Procedure: CYSTOSCOPY WITH INJECTION OF INDOCYANINE GREEN DYE;  Surgeon: Sebastian Ache, MD;  Location: [facility];  Service: Urology;  Laterality: N/A;   CYSTOSCOPY WITH INSERTION OF UROLIFT N/A 01/18/2018   Procedure: CYSTOSCOPY WITH INSERTION OF UROLIFT;  Surgeon: Jerilee Field, MD;  Location: [facility];  Service: Urology;  Laterality: N/A;   CYSTOSCOPY/URETEROSCOPY/HOLMIUM LASER Left 02/17/2019   Procedure: URETEROSCOPY WITH BALLOON DILATION  AND NEPHROSTOGRAM;  Surgeon: Sebastian Ache, MD;  Location: [facility];  Service: Urology;  Laterality: Left;  1 HR   EXCISION RIGHT UPPER ARM LIPOMA  2005   HEMORROIDECTOMY     INGUINAL HERNIA REPAIR Left 1984   IR NEPHROSTOMY PLACEMENT LEFT  01/05/2019   KNEE ARTHROSCOPY Left X3  LAST ONE  2002   ORIF LEFT HUMEROUS FX  1976   POLYPECTOMY  05/11/2018   Procedure: POLYPECTOMY;  Surgeon: Corbin Ade, MD;  [facility];  Service: Endoscopy;;   PROSTATE SURGERY     RIGHT HEART CATH AND CORONARY ANGIOGRAPHY N/A 10/09/2022   Procedure: RIGHT HEART CATH AND CORONARY ANGIOGRAPHY;  Surgeon: [name], MD;  [facility];  Service: Cardiovascular;  Laterality: N/A;   ROBOT ASSISTED LAPAROSCOPIC NEPHRECTOMY Left 07/14/2019   Procedure: XI ROBOTIC ASSISTED LAPAROSCOPIC RETROPERITONEAL NEPHRECTOMY;  Surgeon: Sebastian Ache, MD;  Location: [facility];  Service: Urology;  Laterality: Left;  3 HRS   TONSILLECTOMY  AS CHILD   TOTAL KNEE ARTHROPLASTY Left 2006   REVISION 2007  (AFTER I & D WITH ANTIBIOTIC SPACER PROCEDURE FOR STEPH INFECTION)   TOTAL KNEE ARTHROPLASTY Right 03/30/2016   Procedure: RIGHT TOTAL KNEE ARTHROPLASTY;  Surgeon: Durene Romans, MD;  Location: [facility];  Service: Orthopedics;  Laterality: Right;   TOTAL SHOULDER ARTHROPLASTY Right 04/06/2013   Procedure: RIGHT TOTAL SHOULDER ARTHROPLASTY;  Surgeon: Senaida Lange,  MD;  Location: [facility];  Service: Orthopedics;  Laterality: Right;   TOTAL SHOULDER ARTHROPLASTY Left 07/20/2013   Procedure: LEFT TOTAL SHOULDER ARTHROPLASTY;  Surgeon: Senaida Lange, MD;  Location: [facility];  Service: Orthopedics;  Laterality: Left;   TRANSURETHRAL RESECTION OF BLADDER TUMOR N/A 11/12/2015   Procedure: TRANSURETHRAL RESECTION OF BLADDER TUMOR (TURBT);  Surgeon: Jerilee Field, MD;  Location: [facility];  Service: Urology;  Laterality: N/A;   TRANSURETHRAL RESECTION OF BLADDER TUMOR WITH GYRUS (TURBT-GYRUS) N/A 12/05/2013   Procedure: TRANSURETHRAL RESECTION OF BLADDER TUMOR WITH GYRUS (TURBT-GYRUS);  Surgeon: Jerilee Field, MD;  Location: [facility];  Service: Urology;  Laterality: N/A;    Family History  Problem Relation Age of Onset   Alzheimer's disease Mother    Heart attack Father    Heart disease Father    Irregular heart beat Brother        DEFIB.   Congestive Heart Failure Brother    Diabetes Daughter     Social History Social History   Tobacco Use   Smoking  status: Former    Packs/day: 2.00    Years: 40.00    Additional pack years: 0.00    Total pack years: 80.00    Types: Cigarettes    Quit date: 06/01/1997    Years since quitting: 25.4   Smokeless tobacco: Never  Vaping Use   Vaping Use: Never used  Substance Use Topics   Alcohol use: No   Drug use: No    No Known Allergies  Current Outpatient Medications  Medication Sig Dispense Refill   atorvastatin (LIPITOR) 20 MG tablet Take 1 tablet (20 mg total) by mouth daily. 90 tablet 3   benzonatate (TESSALON) 100 MG capsule Take 1 capsule (100 mg total) by mouth 3 (three) times daily as needed for cough. 20 capsule 0   fluticasone (FLONASE) 50 MCG/ACT nasal spray Place 1 spray into both nostrils daily. 16 g 0   fosfomycin (MONUROL) 3 g PACK Take 3 g by mouth every 3 (three) days for 2 doses. 6 g 0   furosemide (LASIX) 20 MG tablet Take 1 tablet (20 mg) daily as needed  if you gain more than 3 pounds in 1 day or 5 pounds in 1 week. 45 tablet 3   levothyroxine (SYNTHROID) 25 MCG tablet Take 1 tablet (25 mcg total) by mouth daily before breakfast. Take on an empty stomach with water only.  Wait to consume other foods/ drinks/ meds for 30 minutes. 90 tablet 3   losartan (COZAAR) 25 MG tablet Take 1 tablet (25 mg total) by mouth daily. 30 tablet 3   metoprolol succinate (TOPROL-XL) 25 MG 24 hr tablet Take 1 tablet (25 mg total) by mouth daily. 30 tablet 1   polyvinyl alcohol (LIQUIFILM TEARS) 1.4 % ophthalmic solution Place 1 drop into both eyes as needed for dry eyes. 15 mL 0   sulfamethoxazole-trimethoprim (BACTRIM DS) 800-160 MG tablet Take 1 tablet by mouth 2 (two) times daily for 7 days. 14 tablet 0   No current facility-administered medications for this visit.    Review of Systems  Constitutional: positive for fatigue and weight loss Eyes: negative Ears, nose, mouth, throat, and face: negative Respiratory: positive for dyspnea on exertion Cardiovascular: negative Gastrointestinal: negative Genitourinary:negative Integument/breast: negative Hematologic/lymphatic: negative Musculoskeletal:negative Neurological: negative Behavioral/Psych: negative Endocrine: negative Allergic/Immunologic: negative  Physical Exam  ZOX:WRUEA, healthy, no distress, well nourished, and well developed SKIN: skin color, texture, turgor are normal, no rashes or significant lesions HEAD: Normocephalic, No masses, lesions, tenderness or abnormalities EYES: normal, PERRLA, Conjunctiva are pink and non-injected EARS: External ears normal, Canals clear OROPHARYNX:no exudate, no erythema, and lips, buccal mucosa, and tongue normal  NECK: supple, no adenopathy, no JVD LYMPH:  no palpable lymphadenopathy LUNGS: clear to auscultation , and palpation HEART: regular rate & rhythm, no murmurs, and no gallops ABDOMEN:abdomen soft, non-tender, obese, normal bowel sounds, and no  masses or organomegaly BACK: Back symmetric, no curvature., No CVA tenderness EXTREMITIES:no joint deformities, effusion, or inflammation, no edema  NEURO: alert & oriented x 3 with fluent speech, no focal motor/sensory deficits  PERFORMANCE STATUS: ECOG 1  LABORATORY DATA: Lab Results  Component Value Date   WBC 5.9 11/11/2022   HGB 14.0 11/11/2022   HCT 40.8 11/11/2022   MCV 89.1 11/11/2022   PLT 116 (L) 11/11/2022      Chemistry      Component Value Date/Time   NA 135 11/11/2022 1053   NA 138 11/05/2022 0855   K 4.3 11/11/2022 1053   CL 102 11/11/2022 1053  CO2 26 11/11/2022 1053   BUN 22 11/11/2022 1053   BUN 20 11/05/2022 0855   CREATININE 1.47 (H) 11/11/2022 1053      Component Value Date/Time   CALCIUM 10.4 (H) 11/11/2022 1053   ALKPHOS 61 11/11/2022 1053   AST 43 (H) 11/11/2022 1053   ALT 25 11/11/2022 1053   BILITOT 0.8 11/11/2022 1053       RADIOGRAPHIC STUDIES: [place] FNA BX THYROID 1ST LESION AFIRMA  Result Date: 10/20/2022 INDICATION: Thyroid goiter with 2 nodules meeting criteria for biopsy. EXAM: ULTRASOUND GUIDED FINE NEEDLE ASPIRATION OF INDETERMINATE THYROID NODULE COMPARISON:  Prior thyroid ultrasound on 09/11/2022 MEDICATIONS: None COMPLICATIONS: None immediate. TECHNIQUE: Informed written consent was obtained from the patient after a discussion of the risks, benefits and alternatives to treatment. Questions regarding the procedure were encouraged and answered. A timeout was performed prior to the initiation of the procedure. Pre-procedural ultrasound scanning demonstrated unchanged size and appearance of the indeterminate nodules within the isthmus and mid left lobe. The procedure was planned. The neck was prepped in the usual sterile fashion, and a sterile drape was applied covering the operative field. A timeout was performed prior to the initiation of the procedure. Local anesthesia was provided with 1% lidocaine. Under direct ultrasound guidance, 5 FNA  biopsies were performed with 25 gauge needles of a 2 cm isthmus nodule. Multiple ultrasound images were saved for procedural documentation purposes. The samples were prepared and submitted to pathology. Under direct ultrasound guidance, 5 FNA biopsies were performed with 25 gauge needles of a 4.2 cm left mid nodule. Multiple ultrasound images were saved for procedural documentation purposes. The samples were prepared and submitted to pathology. Limited post procedural scanning was negative for hematoma or additional complication. Dressings were placed. The patient tolerated the above procedures procedure well without immediate postprocedural complication. FINDINGS: Nodule reference number based on prior diagnostic ultrasound: 1 Maximum size: 1.5 cm today, 2 cm on the prior ultrasound. Location: Isthmus ACR TI-RADS risk category: TR4 (4-6 points) Reason for biopsy: meets ACR TI-RADS criteria _________________________________________________________ Nodule reference number based on prior diagnostic ultrasound: 2 Maximum size: 4.2-4.3 cm Location: Left; Mid ACR TI-RADS risk category: TR3 (3 points) Reason for biopsy: meets ACR TI-RADS criteria Ultrasound imaging confirms appropriate placement of the needles within the thyroid nodule. IMPRESSION: 1. Technically successful ultrasound guided fine needle aspiration of 1.5-2 cm isthmus nodule. 2. Technically successful ultrasound guided fine needle aspiration of 4.2 cm left mid thyroid nodule. Electronically Signed   By: Irish Lack M.D.   On: 10/20/2022 16:35   [place] FNA BX THYROID 1ST LESION AFIRMA  Result Date: 10/20/2022 INDICATION: Thyroid goiter with 2 nodules meeting criteria for biopsy. EXAM: ULTRASOUND GUIDED FINE NEEDLE ASPIRATION OF INDETERMINATE THYROID NODULE COMPARISON:  Prior thyroid ultrasound on 09/11/2022 MEDICATIONS: None COMPLICATIONS: None immediate. TECHNIQUE: Informed written consent was obtained from the patient after a discussion of the risks,  benefits and alternatives to treatment. Questions regarding the procedure were encouraged and answered. A timeout was performed prior to the initiation of the procedure. Pre-procedural ultrasound scanning demonstrated unchanged size and appearance of the indeterminate nodules within the isthmus and mid left lobe. The procedure was planned. The neck was prepped in the usual sterile fashion, and a sterile drape was applied covering the operative field. A timeout was performed prior to the initiation of the procedure. Local anesthesia was provided with 1% lidocaine. Under direct ultrasound guidance, 5 FNA biopsies were performed with 25 gauge needles of a 2 cm isthmus nodule.  Multiple ultrasound images were saved for procedural documentation purposes. The samples were prepared and submitted to pathology. Under direct ultrasound guidance, 5 FNA biopsies were performed with 25 gauge needles of a 4.2 cm left mid nodule. Multiple ultrasound images were saved for procedural documentation purposes. The samples were prepared and submitted to pathology. Limited post procedural scanning was negative for hematoma or additional complication. Dressings were placed. The patient tolerated the above procedures procedure well without immediate postprocedural complication. FINDINGS: Nodule reference number based on prior diagnostic ultrasound: 1 Maximum size: 1.5 cm today, 2 cm on the prior ultrasound. Location: Isthmus ACR TI-RADS risk category: TR4 (4-6 points) Reason for biopsy: meets ACR TI-RADS criteria _________________________________________________________ Nodule reference number based on prior diagnostic ultrasound: 2 Maximum size: 4.2-4.3 cm Location: Left; Mid ACR TI-RADS risk category: TR3 (3 points) Reason for biopsy: meets ACR TI-RADS criteria Ultrasound imaging confirms appropriate placement of the needles within the thyroid nodule. IMPRESSION: 1. Technically successful ultrasound guided fine needle aspiration of 1.5-2  cm isthmus nodule. 2. Technically successful ultrasound guided fine needle aspiration of 4.2 cm left mid thyroid nodule. Electronically Signed   By: Irish Lack M.D.   On: 10/20/2022 16:35    ASSESSMENT: This is a very pleasant 77 years old white male with multiple medical problems including history of bladder cancer, congestive heart failure, fibromyalgia, psoriasis and obstructive sleep apnea.  The patient presented for evaluation of mild splenomegaly that likely reactive in nature secondary to his comorbidities especially in the congestive heart failure but other etiology like myeloproliferative disorder could not be excluded at this point.   PLAN: I had a lengthy discussion with the patient and his family today about his current condition and further investigation to confirm his diagnosis. Repeat CBC today showed normal total white blood count as well as hemoglobin and hematocrit but low platelets count of 116,000.  This is consistent with his splenomegaly.  He has normal LDH and his comprehensive metabolic panel showed slightly elevated serum calcium as well as serum creatinine of 1.47. I recommended for the patient to have additional studies including flow cytometry for the CLL panel in addition to ESR, acute hepatitis panel, FISH study for BCR/ABL as well as JAK2 mutation to rule out any underlying myeloproliferative disorder. I will also order ultrasound of the abdomen for more accurate measurement of his spleen. I will see the patient back for follow-up visit in 1 months for evaluation and repeat CBC and LDH and also for discussion of his pending lab and imaging studies. For the suspicious thyroid nodules, he was referred to ENT by his primary care physician for evaluation. The patient was advised to call immediately if he has any other concerning symptoms in the interval.  The patient voices understanding of current disease status and treatment options and is in agreement with the current  care plan.  All questions were answered. The patient knows to call the clinic with any problems, questions or concerns. We can certainly see the patient much sooner if necessary.  Thank you so much for allowing me to participate in the care of Billy Rasmussen. I will continue to follow up the patient with you and assist in his care.  The total time spent in the appointment was 60 minutes.  Disclaimer: This note was dictated with voice recognition software. Similar sounding words can inadvertently be transcribed and may not be corrected upon review.   Lajuana Matte November 11, 2022, 11:44 AM

## 2022-11-12 ENCOUNTER — Other Ambulatory Visit: Payer: Self-pay

## 2022-11-12 ENCOUNTER — Telehealth: Payer: Self-pay | Admitting: Family Medicine

## 2022-11-12 DIAGNOSIS — I5042 Chronic combined systolic (congestive) and diastolic (congestive) heart failure: Secondary | ICD-10-CM | POA: Diagnosis not present

## 2022-11-12 DIAGNOSIS — E041 Nontoxic single thyroid nodule: Secondary | ICD-10-CM | POA: Diagnosis not present

## 2022-11-12 DIAGNOSIS — N1831 Chronic kidney disease, stage 3a: Secondary | ICD-10-CM

## 2022-11-12 DIAGNOSIS — R109 Unspecified abdominal pain: Secondary | ICD-10-CM

## 2022-11-12 DIAGNOSIS — I2510 Atherosclerotic heart disease of native coronary artery without angina pectoris: Secondary | ICD-10-CM | POA: Diagnosis not present

## 2022-11-12 DIAGNOSIS — E042 Nontoxic multinodular goiter: Secondary | ICD-10-CM | POA: Diagnosis not present

## 2022-11-12 DIAGNOSIS — I509 Heart failure, unspecified: Secondary | ICD-10-CM | POA: Diagnosis not present

## 2022-11-12 LAB — URINE CULTURE

## 2022-11-12 LAB — SURGICAL PATHOLOGY

## 2022-11-12 NOTE — Telephone Encounter (Signed)
Patient needs an order added for a urine, stated that he was told he needed to come back in a week after finishing his medication to leave a urine. He will finish his medication on 6/14, made lab appt for 6/21.

## 2022-11-13 LAB — FLOW CYTOMETRY

## 2022-11-17 LAB — JAK2 (INCLUDING V617F AND EXON 12), MPL,& CALR-NEXT GEN SEQ

## 2022-11-17 LAB — BCR ABL1 FISH (GENPATH)

## 2022-11-18 ENCOUNTER — Ambulatory Visit: Payer: Medicare HMO | Admitting: Cardiology

## 2022-11-20 ENCOUNTER — Telehealth: Payer: Self-pay | Admitting: Neurology

## 2022-11-20 ENCOUNTER — Other Ambulatory Visit: Payer: Self-pay

## 2022-11-20 ENCOUNTER — Other Ambulatory Visit: Payer: Medicare HMO

## 2022-11-20 DIAGNOSIS — R109 Unspecified abdominal pain: Secondary | ICD-10-CM

## 2022-11-20 DIAGNOSIS — N1831 Chronic kidney disease, stage 3a: Secondary | ICD-10-CM | POA: Diagnosis not present

## 2022-11-20 DIAGNOSIS — N390 Urinary tract infection, site not specified: Secondary | ICD-10-CM

## 2022-11-20 LAB — URINALYSIS, ROUTINE W REFLEX MICROSCOPIC
Bilirubin, UA: NEGATIVE
Glucose, UA: NEGATIVE
Ketones, UA: NEGATIVE
Nitrite, UA: POSITIVE — AB
Protein,UA: NEGATIVE
RBC, UA: NEGATIVE
Specific Gravity, UA: 1.015 (ref 1.005–1.030)
Urobilinogen, Ur: 0.2 mg/dL (ref 0.2–1.0)
pH, UA: 7 (ref 5.0–7.5)

## 2022-11-20 LAB — MICROSCOPIC EXAMINATION
RBC, Urine: NONE SEEN /hpf (ref 0–2)
Renal Epithel, UA: NONE SEEN /hpf

## 2022-11-20 NOTE — Telephone Encounter (Signed)
[facility]- [employer] pending uploaded notes.  

## 2022-11-23 ENCOUNTER — Ambulatory Visit: Payer: Medicare HMO | Attending: Cardiology | Admitting: Student

## 2022-11-23 ENCOUNTER — Encounter: Payer: Self-pay | Admitting: Student

## 2022-11-23 VITALS — BP 111/70 | HR 66

## 2022-11-23 DIAGNOSIS — I5020 Unspecified systolic (congestive) heart failure: Secondary | ICD-10-CM | POA: Diagnosis not present

## 2022-11-23 MED ORDER — SPIRONOLACTONE 25 MG PO TABS: 12.5000 mg | ORAL_TABLET | Freq: Every day | ORAL | 3 refills | Status: DC

## 2022-11-23 NOTE — Assessment & Plan Note (Signed)
Assessment: BP is controlled in office BP 111/70 mmHg HR 66 (goal <130/80) Currently on Toprol XL 25 mg daily, losartan 25 mg daily, Lasix 20 mg mg daily and tolerates them well without any side effects Denies SOB, palpitation, chest pain, headaches,or swelling Due to UTI unable to tolerate SGLT2i   Plan:  Start taking spironolactone 12.5 mg daily  Continue taking Toprol XL 25 mg daily, losartan 25 mg daily Patient to keep record of BP readings with heart rate and report to [place] at the next visit Patient to see PharmD in 4 weeks for follow up  Follow up lab(s) : 1 week to assess K level and renal function

## 2022-11-23 NOTE — Progress Notes (Signed)
Patient ID: Billy Rasmussen                 DOB: 12/09/1945                      MRN: [id number]     HPI: Billy Rasmussen is a 77 y.o. male referred by Dr. Bjorn Pippin to pharmacy clinic for HF medication management. PMH is significant for CHF, HTN,OSA, GERD,. Most recent LVEF <20% on 10/07/2022.  Currently on  Toprol XL 25 mg daily, losartan 25 mg daily, Lasix 20 mg mg daily, Due to UTI unable to tolerate SGLT2i  Patient presented today for HF medication titration. Reports his weight is stable on Lasix 20 mg daily dose ~201 lbs. Denies swelling, SOB. He sleeps on recliner and use 2L of oxygen. He is waiting for sleep study.Denies dizziness, lightheadedness,fatigue, chest pain or palpitation. He adheres to low salt diet and stays active. Checks BP at home it ~120/70 heart rate 73. Discussion with patient today included the following: cardiac medication indications, introduction to GDMT clinic, reasoning behind medication titration, importance of medication adherence, and patient engagement.    Adherence Assessment  Do you ever forget to take your medication? [] Yes [x] No  Do you ever skip doses due to side effects? [] Yes [x] No  Do you have trouble affording your medicines? [] Yes [x] No  Are you ever unable to pick up your medication due to transportation difficulties? [] Yes [x] No  Do you ever stop taking your medications because you don't believe they are helping? [] Yes [x] No  Do you check your weight daily? [x] Yes [] No   Adherence strategy: pill box   Barriers to obtaining medications: none   BP goal: <130/80  Family History:  Father: died from MI at age of 73  Mother : dementia  Brother : CHF - died at age 53   Social History:  Smoking drinking : quit 1999  Diet: low salt   Exercise: stays active, generally walks but due to extreme heat not going out for walk  Home BP readings: 120/70 heart rate 73   Wt Readings from Last 3 Encounters:  11/11/22 209 lb (94.8 kg)   11/10/22 208 lb 12.8 oz (94.7 kg)  11/05/22 207 lb 3.2 oz (94 kg)   BP Readings from Last 3 Encounters:  11/23/22 111/70  11/11/22 120/78  11/10/22 118/64   Pulse Readings from Last 3 Encounters:  11/23/22 66  11/11/22 66  11/10/22 67    Renal function: Estimated Creatinine Clearance: 46.8 mL/min (A) (by C-G formula based on SCr of 1.47 mg/dL (H)).  Past Medical History:  Diagnosis Date   Arthritis    knees, shoulders, elbows   Bladder cancer [facility])    urologist-  dr Mena Goes   CHF (congestive heart failure) (HCC)    Diverticulosis of colon    Fibromyalgia    GERD (gastroesophageal reflux disease)    History of diverticulitis of colon    History of gastric ulcer    due to aleve   Hypertension    Lower urinary tract symptoms (LUTS)    OSA (obstructive sleep apnea)    NON-COMPLIANT  CPAP  --- BUT PT USES OXYGEN AT NIGHT 2.5L VIA  (PT'S DECISION)   PONV (postoperative nausea and vomiting)    Psoriasis    Sepsis (HCC)    january 2021   Tinnitus    right ear more, has tranmitter in right ear removable at hs   Wears glasses    Wears  partial dentures     Current Outpatient Medications on File Prior to Visit  Medication Sig Dispense Refill   atorvastatin (LIPITOR) 20 MG tablet Take 1 tablet (20 mg total) by mouth daily. 90 tablet 3   benzonatate (TESSALON) 100 MG capsule Take 1 capsule (100 mg total) by mouth 3 (three) times daily as needed for cough. 20 capsule 0   fluticasone (FLONASE) 50 MCG/ACT nasal spray Place 1 spray into both nostrils daily. 16 g 0   furosemide (LASIX) 20 MG tablet Take 1 tablet (20 mg) daily as needed if you gain more than 3 pounds in 1 day or 5 pounds in 1 week. 45 tablet 3   levothyroxine (SYNTHROID) 25 MCG tablet Take 1 tablet (25 mcg total) by mouth daily before breakfast. Take on an empty stomach with water only.  Wait to consume other foods/ drinks/ meds for 30 minutes. 90 tablet 3   losartan (COZAAR) 25 MG tablet Take 1 tablet (25 mg  total) by mouth daily. 30 tablet 3   metoprolol succinate (TOPROL-XL) 25 MG 24 hr tablet Take 1 tablet (25 mg total) by mouth daily. 30 tablet 1   polyvinyl alcohol (LIQUIFILM TEARS) 1.4 % ophthalmic solution Place 1 drop into both eyes as needed for dry eyes. 15 mL 0   No current facility-administered medications on file prior to visit.    No Known Allergies      HFrEF (heart failure with reduced ejection fraction) (HCC) Assessment & Plan: Assessment: BP is controlled in office BP 111/70 mmHg HR 66 (goal <130/80) Currently on Toprol XL 25 mg daily, losartan 25 mg daily, Lasix 20 mg mg daily and tolerates them well without any side effects Denies SOB, palpitation, chest pain, headaches,or swelling Due to UTI unable to tolerate SGLT2i   Plan:  Start taking spironolactone 12.5 mg daily  Continue taking Toprol XL 25 mg daily, losartan 25 mg daily Patient to keep record of BP readings with heart rate and report to [place] at the next visit Patient to see PharmD in 4 weeks for follow up  Follow up lab(s) : 1 week to assess K level and renal function   Orders: -     Basic metabolic panel  Other orders -     Spironolactone; Take 0.5 tablets (12.5 mg total) by mouth daily.  Dispense: 90 tablet; Refill: 3       Thank you   Carmela Hurt, Pharm.D [facility] A Division of [facility] [address] [place], [place] [address]  Phone: [phone]; Fax: [phone]

## 2022-11-23 NOTE — Patient Instructions (Addendum)
Changes made by your pharmacist Carmela Hurt, PharmD at today's visit:    Instructions/Changes  (what do you need to do) Your Notes  (what you did and when you did it)  Continue taking Toprol XL 25 mg daily, losartan 25 mg daily, Lasix 20 mg mg daily   Start taking spironolactone 12.5 mg daily (1/2 tab of 25 mg)   Follow up lab is due in 1 week       If you have any questions or concerns please use My Chart to send questions or call the office at [phone]

## 2022-11-25 LAB — URINE CULTURE

## 2022-11-25 NOTE — Telephone Encounter (Signed)
LVM for pt to call back to schedule  Ethlyn Gallery: [id number] (exp. 11/20/22 to 02/15/23)

## 2022-11-25 NOTE — Telephone Encounter (Signed)
[employer]: [id number] (exp. 11/20/22 to 02/15/23)

## 2022-11-26 ENCOUNTER — Other Ambulatory Visit: Payer: Medicare HMO

## 2022-11-26 ENCOUNTER — Ambulatory Visit (HOSPITAL_COMMUNITY)
Admission: RE | Admit: 2022-11-26 | Discharge: 2022-11-26 | Disposition: A | Discharge status: Home / Self Care | Payer: Medicare HMO | Source: Ambulatory Visit | Attending: Internal Medicine | Admitting: Internal Medicine

## 2022-11-26 DIAGNOSIS — R161 Splenomegaly, not elsewhere classified: Secondary | ICD-10-CM

## 2022-11-26 DIAGNOSIS — N390 Urinary tract infection, site not specified: Secondary | ICD-10-CM

## 2022-11-26 DIAGNOSIS — R160 Hepatomegaly, not elsewhere classified: Secondary | ICD-10-CM | POA: Diagnosis not present

## 2022-11-26 DIAGNOSIS — K838 Other specified diseases of biliary tract: Secondary | ICD-10-CM | POA: Diagnosis not present

## 2022-11-26 LAB — URINE CULTURE

## 2022-11-26 LAB — URINALYSIS, ROUTINE W REFLEX MICROSCOPIC
Bilirubin, UA: NEGATIVE
Glucose, UA: NEGATIVE
Ketones, UA: NEGATIVE
Nitrite, UA: NEGATIVE
Specific Gravity, UA: 1.01 (ref 1.005–1.030)
Urobilinogen, Ur: 0.2 mg/dL (ref 0.2–1.0)
pH, UA: 7 (ref 5.0–7.5)

## 2022-11-26 LAB — MICROSCOPIC EXAMINATION
Epithelial Cells (non renal): NONE SEEN /hpf (ref 0–10)
Renal Epithel, UA: NONE SEEN /hpf

## 2022-11-28 LAB — URINE CULTURE

## 2022-11-30 LAB — URINE CULTURE

## 2022-12-01 ENCOUNTER — Other Ambulatory Visit: Payer: Medicare HMO

## 2022-12-01 ENCOUNTER — Other Ambulatory Visit: Payer: Self-pay | Admitting: Family

## 2022-12-01 ENCOUNTER — Telehealth: Payer: Self-pay | Admitting: Family Medicine

## 2022-12-01 ENCOUNTER — Other Ambulatory Visit (HOSPITAL_COMMUNITY): Payer: Self-pay

## 2022-12-01 DIAGNOSIS — I5020 Unspecified systolic (congestive) heart failure: Secondary | ICD-10-CM | POA: Diagnosis not present

## 2022-12-01 NOTE — Telephone Encounter (Signed)
Pt called to let us know that his Urologist, Dr Berneice Heinrich, needs his urine results faxed to them at [phone]. Also says to let Dr Nadine Counts know that he has 2 upcoming appts with Dr Berneice Heinrich on 7/11 and 7/18.

## 2022-12-02 ENCOUNTER — Encounter: Payer: Self-pay | Admitting: *Deleted

## 2022-12-02 ENCOUNTER — Telehealth: Payer: Self-pay

## 2022-12-02 ENCOUNTER — Encounter: Payer: Self-pay | Admitting: Oncology

## 2022-12-02 DIAGNOSIS — R8271 Bacteriuria: Secondary | ICD-10-CM | POA: Diagnosis not present

## 2022-12-02 LAB — BASIC METABOLIC PANEL
BUN/Creatinine Ratio: 17 (ref 10–24)
BUN: 24 mg/dL (ref 8–27)
CO2: 23 mmol/L (ref 20–29)
Calcium: 10.1 mg/dL (ref 8.6–10.2)
Chloride: 101 mmol/L (ref 96–106)
Creatinine, Ser: 1.4 mg/dL — ABNORMAL HIGH (ref 0.76–1.27)
Glucose: 87 mg/dL (ref 70–99)
Potassium: 4.7 mmol/L (ref 3.5–5.2)
Sodium: 140 mmol/L (ref 134–144)
eGFR: 52 mL/min/{1.73_m2} — ABNORMAL LOW (ref >59)

## 2022-12-02 NOTE — Telephone Encounter (Signed)
Pt has been informed of Dr. Darrol Poke recommendations.  Pt is currently at the urology office and feels that he shouldn't wait any longer.  Pt will call back if needed.

## 2022-12-02 NOTE — Telephone Encounter (Signed)
Yes I do agree with calling the urologist first because he may just be a carrier of this bacteria may not be actually a new infection but I would have him call them first if he can contact our office but if he does develop any high fevers nausea vomiting or feeling really ill then go to the emergency department.

## 2022-12-02 NOTE — Telephone Encounter (Signed)
Billy Rasmussen patient  Patient calling because he had received his urine culture results from [place] and is concerned and feels like he may need to be admitted into hospital for IV antibiotics.  Patient's urine grew a bacteria that is resistant to multiple antibiotics.  Neysa Bonito had spoken with patient about results and gave him red flags to watch for to go to ED.  Patient reports that he is having some back and abdominal pain but no other symptoms.  He is worried because he only has one kidney.  I sent patient's results to his urologist and advised patient to contact their office to get him to look at the results and to see if he can be seen and possibly direct admitted into the hospital without having to go to ER.  Patient is going to call urology office and see how they can help.

## 2022-12-04 ENCOUNTER — Telehealth: Payer: Self-pay | Admitting: Medical Oncology

## 2022-12-04 ENCOUNTER — Telehealth: Payer: Self-pay | Admitting: Internal Medicine

## 2022-12-04 NOTE — Telephone Encounter (Signed)
Patient is aware of upcoming appointment times/dates.  

## 2022-12-04 NOTE — Telephone Encounter (Signed)
[place] ABD report called . Pt has labs and f/u next Thursday  "IMPRESSION: 1. Focal mass-like hypoechoic area in the right hepatic lobe, measuring up to 1.6 cm. Recommend further assessment with MRI abdomen liver protocol. 2. Gallbladder sludge without evidence of cholelithiasis or acute cholecystitis. 3. Normal sonographic appearance of the spleen."

## 2022-12-04 NOTE — Telephone Encounter (Signed)
Results faxed.   Jannifer Rodney, [name]

## 2022-12-07 ENCOUNTER — Other Ambulatory Visit: Payer: Self-pay | Admitting: Internal Medicine

## 2022-12-07 DIAGNOSIS — K769 Liver disease, unspecified: Secondary | ICD-10-CM

## 2022-12-07 NOTE — Telephone Encounter (Signed)
[employer]: [id number] (exp. 12/07/22 to 04/20/23)   Patient is scheduled at [facility] for 12/13/22 at 8 pm.  Mailed packet to the patient as well.

## 2022-12-08 ENCOUNTER — Telehealth: Payer: Self-pay | Admitting: Medical Oncology

## 2022-12-08 ENCOUNTER — Encounter: Payer: Self-pay | Admitting: Cardiovascular Disease

## 2022-12-08 ENCOUNTER — Ambulatory Visit: Payer: Medicare HMO | Attending: Cardiovascular Disease | Admitting: Cardiovascular Disease

## 2022-12-08 VITALS — BP 120/76 | HR 72 | Ht 66.0 in | Wt 211.4 lb

## 2022-12-08 DIAGNOSIS — I5042 Chronic combined systolic (congestive) and diastolic (congestive) heart failure: Secondary | ICD-10-CM | POA: Diagnosis not present

## 2022-12-08 NOTE — Telephone Encounter (Signed)
He cannot do an MRI because he has metal below his left eye. Appt Thursday.

## 2022-12-08 NOTE — Patient Instructions (Signed)
Medication Instructions:  Your physician recommends that you continue on your current medications as directed. Please refer to the Current Medication list given to you today. *If you need a refill on your cardiac medications before your next appointment, please call your pharmacy*   Testing/Procedures: Echocardiogram - schedule for end of August/beginning of September  Your physician has requested that you have an echocardiogram. Echocardiography is a painless test that uses sound waves to create images of your heart. It provides your doctor with information about the size and shape of your heart and how well your heart's chambers and valves are working. This procedure takes approximately one hour. There are no restrictions for this procedure. Please do NOT wear cologne, perfume, aftershave, or lotions (deodorant is allowed). Please arrive 15 minutes prior to your appointment time.    Follow-Up: At [facility], you and your health needs are our priority.  As part of our continuing mission to provide you with exceptional heart care, we have created designated Provider Care Teams.  These Care Teams include your primary Cardiologist (physician) and Advanced Practice Providers (APPs -  Physician Assistants and Nurse Practitioners) who all work together to provide you with the care you need, when you need it.  We recommend signing up for the patient portal called "MyChart".  Sign up information is provided on this After Visit Summary.  MyChart is used to connect with patients for Virtual Visits (Telemedicine).  Patients are able to view lab/test results, encounter notes, upcoming appointments, etc.  Non-urgent messages can be sent to your provider as well.   To learn more about what you can do with MyChart, go to [email or web address].    Your next appointment:   2 week(s) after echocardiogram   Provider:   York Pellant, MD

## 2022-12-08 NOTE — Progress Notes (Signed)
Electrophysiology Office Note:    Date:  12/08/2022   ID:  Billy, Rasmussen July 25, 1945, MRN [id number]  PCP:  Billy Ip, DO   [facility] Providers Cardiologist:  Billy Guadalajara, MD     Referring MD: Billy Rasmussen*   History of Present Illness:    Billy Rasmussen is a 77 y.o. male with a medical history significant for chronic heart failure with reduced ejection fraction cancer status post left nephrectomy, cystoprostatectomy, obstructive sleep apnea not on CPAP, referred for possible CRT device.     He was diagnosed with heart failure in May 2024.  Ejection fraction was less than 20%.  Noted to have a left bundle branch block with a QRS greater than 160.  Coronary angiogram did not show significant coronary disease.  Is unable to have an MRI due to presence of metal in his eye.  He was discharged on metoprolol XL.  Losartan was added and follow-up on May 24.  Spironolactone was added in follow-up on May 30.   He is currently undergoing evaluation for thyroid cancer and myeloproliferative disorder.  He has a history of bladder cancer with cystectomy and an ileocecal conduit and colostomy.     He reports today that he is doing well and feels at baseline.  No acute complaints.  He does have shortness of breath and fatigue.  He expresses that he is not particularly eager to have a defibrillator and is comfortable with the prospect of death, particularly given all his other health issues.  EKGs/Labs/Other Studies Reviewed Today:    Echocardiogram:  TTE 09/2022 Ejection fraction less than 20%, mild LVH, slow flow in the LV apex -- potential thrombus in formation. Severely dilated left atrium. Degenerative MV with mild MR.   Monitors:  Zio 10 day monitor 10/2022 -- my interpretation Sinus rhythm 47 217 bpm, average 74.  Less than 1% ectopy.  Few brief episodes of SVT.  Stress testing:   Advanced imaging:    Cardiac catherization  10/09/2022  RHC/LHV Reviewed in epic  EKG:   EKG Interpretation Date/Time:  Tuesday December 08 2022 11:27:24 EDT Ventricular Rate:  72 PR Interval:  276 QRS Duration:  170 QT Interval:  448 QTC Calculation: 490 R Axis:   137  Text Interpretation: Sinus rhythm with 1st degree A-V block Left bundle branch block When compared with ECG of 09-Oct-2022 11:27, PR interval has increased Confirmed by Billy Rasmussen [phone]) on 12/08/2022 11:44:16 AM     Physical Exam:    VS:  BP 120/76   Pulse 72   Ht 5\' 6"  (1.676 m)   Wt 211 lb 6.4 oz (95.9 kg)   SpO2 97%   BMI 34.12 kg/m     Wt Readings from Last 3 Encounters:  12/08/22 211 lb 6.4 oz (95.9 kg)  11/11/22 209 lb (94.8 kg)  11/10/22 208 lb 12.8 oz (94.7 kg)     GEN:  Well nourished, well developed in no acute distress CARDIAC: RRR, no murmurs, rubs, gallops RESPIRATORY:  Normal work of breathing MUSCULOSKELETAL: no edema    ASSESSMENT & PLAN:    Nonischemic cardiomyopathy, CHF reduced EF EF is less than 20% at the time of diagnosis in May He has been on GDMT since Oct 23, 2022 (losartan added to Metop XL) Spironolactone started 10/29/2022 No SG LT 2 inhibitor due to frequent UTIs He will need a repeat echocardiogram after August 24. Will consider CRT if EF remains less than 35% after 90  days of GDMT TTE ordered and follow-up scheduled  Left bundle branch block QRS greater than 160 Possible LBBB - induced cardiomyopathy Has not been adequately treated medically to consider CRT  Risk of sudden cardiac death -- ?defibrillator  He is currently being evaluated for thyroid cancer and has multiple comorbidities He expressed today that he may not want a defibrillator We discussed the functions of and potential outcomes of the defibrillator. We will readdress in follow-up whether CRT-D or CRT-P fits within his goals of care if he qualifies for CRT   Signed, Billy Small, MD  12/08/2022 12:03 PM    [facility]

## 2022-12-09 ENCOUNTER — Other Ambulatory Visit: Payer: Self-pay | Admitting: Internal Medicine

## 2022-12-09 DIAGNOSIS — R160 Hepatomegaly, not elsewhere classified: Secondary | ICD-10-CM

## 2022-12-10 ENCOUNTER — Inpatient Hospital Stay: Payer: Medicare HMO | Attending: Internal Medicine

## 2022-12-10 ENCOUNTER — Other Ambulatory Visit (HOSPITAL_COMMUNITY): Payer: Self-pay | Admitting: Urology

## 2022-12-10 ENCOUNTER — Ambulatory Visit (HOSPITAL_COMMUNITY)
Admission: RE | Admit: 2022-12-10 | Discharge: 2022-12-10 | Disposition: A | Discharge status: Home / Self Care | Payer: Medicare HMO | Source: Ambulatory Visit | Attending: Urology | Admitting: Urology

## 2022-12-10 ENCOUNTER — Inpatient Hospital Stay (HOSPITAL_BASED_OUTPATIENT_CLINIC_OR_DEPARTMENT_OTHER): Payer: Medicare HMO | Admitting: Internal Medicine

## 2022-12-10 ENCOUNTER — Other Ambulatory Visit: Payer: Self-pay

## 2022-12-10 VITALS — BP 113/69 | HR 71 | Temp 97.7°F | Resp 18 | Ht 66.0 in | Wt 210.9 lb

## 2022-12-10 DIAGNOSIS — Z8551 Personal history of malignant neoplasm of bladder: Secondary | ICD-10-CM | POA: Insufficient documentation

## 2022-12-10 DIAGNOSIS — D696 Thrombocytopenia, unspecified: Secondary | ICD-10-CM | POA: Insufficient documentation

## 2022-12-10 DIAGNOSIS — C678 Malignant neoplasm of overlapping sites of bladder: Secondary | ICD-10-CM | POA: Diagnosis not present

## 2022-12-10 DIAGNOSIS — R161 Splenomegaly, not elsewhere classified: Secondary | ICD-10-CM | POA: Insufficient documentation

## 2022-12-10 DIAGNOSIS — C679 Malignant neoplasm of bladder, unspecified: Secondary | ICD-10-CM | POA: Diagnosis not present

## 2022-12-10 DIAGNOSIS — Z905 Acquired absence of kidney: Secondary | ICD-10-CM | POA: Insufficient documentation

## 2022-12-10 DIAGNOSIS — Z96612 Presence of left artificial shoulder joint: Secondary | ICD-10-CM | POA: Diagnosis not present

## 2022-12-10 DIAGNOSIS — Z96611 Presence of right artificial shoulder joint: Secondary | ICD-10-CM | POA: Diagnosis not present

## 2022-12-10 DIAGNOSIS — Z79899 Other long term (current) drug therapy: Secondary | ICD-10-CM | POA: Insufficient documentation

## 2022-12-10 DIAGNOSIS — K8020 Calculus of gallbladder without cholecystitis without obstruction: Secondary | ICD-10-CM | POA: Diagnosis not present

## 2022-12-10 LAB — CBC WITH DIFFERENTIAL (CANCER CENTER ONLY)
Abs Immature Granulocytes: 0.02 10*3/uL (ref 0.00–0.07)
Basophils Absolute: 0 10*3/uL (ref 0.0–0.1)
Basophils Relative: 0 %
Eosinophils Absolute: 0.2 10*3/uL (ref 0.0–0.5)
Eosinophils Relative: 4 %
HCT: 40.3 % (ref 39.0–52.0)
Hemoglobin: 13.9 g/dL (ref 13.0–17.0)
Immature Granulocytes: 0 %
Lymphocytes Relative: 20 %
Lymphs Abs: 1.3 10*3/uL (ref 0.7–4.0)
MCH: 31 pg (ref 26.0–34.0)
MCHC: 34.5 g/dL (ref 30.0–36.0)
MCV: 89.8 fL (ref 80.0–100.0)
Monocytes Absolute: 0.5 10*3/uL (ref 0.1–1.0)
Monocytes Relative: 8 %
Neutro Abs: 4.5 10*3/uL (ref 1.7–7.7)
Neutrophils Relative %: 68 %
Platelet Count: 142 10*3/uL — ABNORMAL LOW (ref 150–400)
RBC: 4.49 MIL/uL (ref 4.22–5.81)
RDW: 14.3 % (ref 11.5–15.5)
WBC Count: 6.5 10*3/uL (ref 4.0–10.5)
nRBC: 0 % (ref 0.0–0.2)

## 2022-12-10 LAB — LACTATE DEHYDROGENASE: LDH: 117 U/L (ref 98–192)

## 2022-12-10 NOTE — Progress Notes (Signed)
[facility] [phone]   [phone]  OFFICE PROGRESS NOTE  Raliegh Ip, DO [address] [place] [address]  DIAGNOSIS:  1) Splenomegaly of unclear etiology but there was suspicious liver lesion on the ultrasound of the abdomen. 2) history of bladder cancer status post TURBT under the care of Dr. Mena Goes diagnosed in July 2015 and followed by urology.  PRIOR THERAPY: None  CURRENT THERAPY: None  INTERVAL HISTORY: Billy Rasmussen 77 y.o. male returns to the clinic today for follow-up visit accompanied by his wife and his daughter Irving Burton.  The patient is feeling fine today with no concerning complaints except for epigastric pain.  He denied having any current chest pain, shortness of breath except with exertion with no cough or hemoptysis.  He has no nausea, vomiting, diarrhea or constipation.  He has no headache or visual changes.  The patient had ultrasound of the abdomen performed recently and it showed normal sonographic appearance of the spleen but there was focal masslike hypoechoic area in the right hepatic lobe measuring up to 1.6 cm and MRI was recommended but the patient has foreign metal material in his left eye.  He also has gallbladder sludge without evidence of cholelithiasis or acute cholecystitis.  He had CT of the abdomen pelvis performed earlier today at the urology center by his urologist.  He is here today for evaluation and recommendation regarding his condition.  MEDICAL HISTORY: Past Medical History:  Diagnosis Date   Arthritis    knees, shoulders, elbows   Bladder cancer [facility])    urologist-  dr Mena Goes   CHF (congestive heart failure) (HCC)    Diverticulosis of colon    Fibromyalgia    GERD (gastroesophageal reflux disease)    History of diverticulitis of colon    History of gastric ulcer    due to aleve   Hypertension    Lower urinary tract symptoms (LUTS)    OSA (obstructive sleep apnea)    NON-COMPLIANT   CPAP  --- BUT PT USES OXYGEN AT NIGHT 2.5L VIA [place] (PT'S DECISION)   PONV (postoperative nausea and vomiting)    Psoriasis    Sepsis (HCC)    january 2021   Tinnitus    right ear more, has tranmitter in right ear removable at hs   Wears glasses    Wears partial dentures     ALLERGIES:  has No Known Allergies.  MEDICATIONS:  Current Outpatient Medications  Medication Sig Dispense Refill   atorvastatin (LIPITOR) 20 MG tablet Take 1 tablet (20 mg total) by mouth daily. 90 tablet 3   benzonatate (TESSALON) 100 MG capsule Take 1 capsule (100 mg total) by mouth 3 (three) times daily as needed for cough. 20 capsule 0   fluticasone (FLONASE) 50 MCG/ACT nasal spray Place 1 spray into both nostrils daily. 16 g 0   furosemide (LASIX) 20 MG tablet Take 1 tablet (20 mg) daily as needed if you gain more than 3 pounds in 1 day or 5 pounds in 1 week. 45 tablet 3   levothyroxine (SYNTHROID) 25 MCG tablet Take 1 tablet (25 mcg total) by mouth daily before breakfast. Take on an empty stomach with water only.  Wait to consume other foods/ drinks/ meds for 30 minutes. 90 tablet 3   losartan (COZAAR) 25 MG tablet Take 1 tablet (25 mg total) by mouth daily. 30 tablet 3   metoprolol succinate (TOPROL-XL) 25 MG 24 hr tablet Take 1  tablet (25 mg total) by mouth daily. 30 tablet 1   polyvinyl alcohol (LIQUIFILM TEARS) 1.4 % ophthalmic solution Place 1 drop into both eyes as needed for dry eyes. 15 mL 0   spironolactone (ALDACTONE) 25 MG tablet Take 0.5 tablets (12.5 mg total) by mouth daily. 90 tablet 3   No current facility-administered medications for this visit.    SURGICAL HISTORY:  Past Surgical History:  Procedure Laterality Date   APPENDECTOMY     COLECTOMY W/ COLOSTOMY  1996   W/   APPENDECTOMY   COLONOSCOPY N/A 05/11/2018   Procedure: COLONOSCOPY;  Surgeon: Corbin Ade, MD;  [facility];  Service: Endoscopy;  Laterality: N/A;  9:30   COLOSTOMY TAKEDOWN  1996   CYSTOSCOPY WITH  BIOPSY N/A 12/05/2013   Procedure: CYSTO BLADDER BIOPSY AND FULGERATION;  Surgeon: Jerilee Field, MD;  Location: [facility];  Service: Urology;  Laterality: N/A;   CYSTOSCOPY WITH BIOPSY Bilateral 11/13/2014   Procedure: CYSTOSCOPY WITH  BLADDER BIOPSY FULGERATION AND BILATERAL RETROGRADE PYELOGRAMS;  Surgeon: Jerilee Field, MD;  Location: [facility];  Service: Urology;  Laterality: Bilateral;   CYSTOSCOPY WITH FULGERATION N/A 01/18/2018   Procedure: CYSTOSCOPY WITH FULGERATION/ BLADDER BIOPSY;  Surgeon: Jerilee Field, MD;  Location: [facility];  Service: Urology;  Laterality: N/A;   CYSTOSCOPY WITH INJECTION N/A 11/09/2018   Procedure: CYSTOSCOPY WITH INJECTION OF INDOCYANINE GREEN DYE;  Surgeon: Sebastian Ache, MD;  Location: [facility];  Service: Urology;  Laterality: N/A;   CYSTOSCOPY WITH INSERTION OF UROLIFT N/A 01/18/2018   Procedure: CYSTOSCOPY WITH INSERTION OF UROLIFT;  Surgeon: Jerilee Field, MD;  Location: [facility];  Service: Urology;  Laterality: N/A;   CYSTOSCOPY/URETEROSCOPY/HOLMIUM LASER Left 02/17/2019   Procedure: URETEROSCOPY WITH BALLOON DILATION  AND NEPHROSTOGRAM;  Surgeon: Sebastian Ache, MD;  Location: [facility];  Service: Urology;  Laterality: Left;  1 HR   EXCISION RIGHT UPPER ARM LIPOMA  2005   HEMORROIDECTOMY     INGUINAL HERNIA REPAIR Left 1984   IR NEPHROSTOMY PLACEMENT LEFT  01/05/2019   KNEE ARTHROSCOPY Left X3  LAST ONE  2002   ORIF LEFT HUMEROUS FX  1976   POLYPECTOMY  05/11/2018   Procedure: POLYPECTOMY;  Surgeon: Corbin Ade, MD;  [facility];  Service: Endoscopy;;   PROSTATE SURGERY     RIGHT HEART CATH AND CORONARY ANGIOGRAPHY N/A 10/09/2022   Procedure: RIGHT HEART CATH AND CORONARY ANGIOGRAPHY;  Surgeon: [name], MD;  [facility];  Service: Cardiovascular;  Laterality: N/A;   ROBOT ASSISTED LAPAROSCOPIC NEPHRECTOMY Left 07/14/2019    Procedure: XI ROBOTIC ASSISTED LAPAROSCOPIC RETROPERITONEAL NEPHRECTOMY;  Surgeon: Sebastian Ache, MD;  Location: [facility];  Service: Urology;  Laterality: Left;  3 HRS   TONSILLECTOMY  AS CHILD   TOTAL KNEE ARTHROPLASTY Left 2006   REVISION 2007  (AFTER I & D WITH ANTIBIOTIC SPACER PROCEDURE FOR STEPH INFECTION)   TOTAL KNEE ARTHROPLASTY Right 03/30/2016   Procedure: RIGHT TOTAL KNEE ARTHROPLASTY;  Surgeon: Durene Romans, MD;  Location: [facility];  Service: Orthopedics;  Laterality: Right;   TOTAL SHOULDER ARTHROPLASTY Right 04/06/2013   Procedure: RIGHT TOTAL SHOULDER ARTHROPLASTY;  Surgeon: Senaida Lange, MD;  Location: [facility];  Service: Orthopedics;  Laterality: Right;   TOTAL SHOULDER ARTHROPLASTY Left 07/20/2013   Procedure: LEFT TOTAL SHOULDER ARTHROPLASTY;  Surgeon: Senaida Lange, MD;  Location: [facility];  Service: Orthopedics;  Laterality: Left;  TRANSURETHRAL RESECTION OF BLADDER TUMOR N/A 11/12/2015   Procedure: TRANSURETHRAL RESECTION OF BLADDER TUMOR (TURBT);  Surgeon: Jerilee Field, MD;  Location: [facility];  Service: Urology;  Laterality: N/A;   TRANSURETHRAL RESECTION OF BLADDER TUMOR WITH GYRUS (TURBT-GYRUS) N/A 12/05/2013   Procedure: TRANSURETHRAL RESECTION OF BLADDER TUMOR WITH GYRUS (TURBT-GYRUS);  Surgeon: Jerilee Field, MD;  Location: [facility];  Service: Urology;  Laterality: N/A;    REVIEW OF SYSTEMS:  Constitutional: positive for fatigue Eyes: negative Ears, nose, mouth, throat, and face: negative Respiratory: negative Cardiovascular: negative Gastrointestinal: positive for dyspepsia and reflux symptoms Genitourinary:negative Integument/breast: negative Hematologic/lymphatic: negative Musculoskeletal:negative Neurological: negative Behavioral/Psych: negative Endocrine: negative Allergic/Immunologic: negative   PHYSICAL EXAMINATION: General appearance: alert, cooperative, fatigued, and no distress Head: Normocephalic, without  obvious abnormality, atraumatic Neck: no adenopathy, no JVD, supple, symmetrical, trachea midline, and thyroid not enlarged, symmetric, no tenderness/mass/nodules Lymph nodes: Cervical, supraclavicular, and axillary nodes normal. Resp: clear to auscultation bilaterally Back: symmetric, no curvature. ROM normal. No CVA tenderness. Cardio: regular rate and rhythm, S1, S2 normal, no murmur, click, rub or gallop GI: soft, non-tender; bowel sounds normal; no masses,  no organomegaly Extremities: extremities normal, atraumatic, no cyanosis or edema Neurologic: Alert and oriented X 3, normal strength and tone. Normal symmetric reflexes. Normal coordination and gait  ECOG PERFORMANCE STATUS: 1 - Symptomatic but completely ambulatory  Blood pressure 113/69, pulse 71, temperature 97.7 F (36.5 C), temperature source Oral, resp. rate 18, height 5\' 6"  (1.676 m), weight 210 lb 14.4 oz (95.7 kg), SpO2 99%.  LABORATORY DATA: Lab Results  Component Value Date   WBC 6.5 12/10/2022   HGB 13.9 12/10/2022   HCT 40.3 12/10/2022   MCV 89.8 12/10/2022   PLT 142 (L) 12/10/2022      Chemistry      Component Value Date/Time   NA 140 12/01/2022 0911   K 4.7 12/01/2022 0911   CL 101 12/01/2022 0911   CO2 23 12/01/2022 0911   BUN 24 12/01/2022 0911   CREATININE 1.40 (H) 12/01/2022 0911   CREATININE 1.47 (H) 11/11/2022 1053      Component Value Date/Time   CALCIUM 10.1 12/01/2022 0911   ALKPHOS 61 11/11/2022 1053   AST 43 (H) 11/11/2022 1053   ALT 25 11/11/2022 1053   BILITOT 0.8 11/11/2022 1053       RADIOGRAPHIC STUDIES: US Abdomen Complete  Result Date: 12/02/2022 CLINICAL DATA:  Splenomegaly on recent CT EXAM: ABDOMEN ULTRASOUND COMPLETE COMPARISON:  CT renal stone protocol Oct 12, 2019 FINDINGS: Gallbladder: No gallstones or wall thickening visualized. Layering gallbladder sludge present. No sonographic Murphy sign noted by sonographer. Common bile duct: Diameter: 4 mm Liver: There is a focal  mass-like hypoechoic area in the right hepatic lobe that measures 1.6 x 1.3 x 1.1 cm, without internal vascularity. The hepatic parenchymal echogenicity is within normal limits. Portal vein is patent on color Doppler imaging with normal direction of blood flow towards the liver. IVC: No abnormality visualized. Pancreas: Not well-visualized secondary to overlying bowel gas. Spleen: Size and appearance within normal limits. Right Kidney: Length: 11.1 cm. Echogenicity within normal limits. No mass or hydronephrosis visualized. Left Kidney: Length: Surgically absent. Abdominal aorta: Not well-visualized secondary to overlying bowel gas. Other findings: None. IMPRESSION: 1. Focal mass-like hypoechoic area in the right hepatic lobe, measuring up to 1.6 cm. Recommend further assessment with MRI abdomen liver protocol. 2. Gallbladder sludge without evidence of cholelithiasis or acute cholecystitis. 3. Normal sonographic appearance of the spleen. These  results will be called to the ordering clinician or representative by the Radiologist Assistant, and communication documented in the PACS or [employer]. Electronically Signed   By: Jacob Moores M.D.   On: 12/02/2022 21:54   LONG TERM MONITOR (3-14 DAYS)  Result Date: 11/27/2022   1 episode of NSVT lasting 8 beats   10 episodes of SVT with longest lasting 19 beats Patch Wear Time:  9 days and 21 hours (2024-05-28T11:36:01-399 to 2024-06-07T09:35:32-0400) Patient had a min HR of 47 bpm, max HR of 152 bpm, and avg HR of 74 bpm. Predominant underlying rhythm was Sinus Rhythm. First Degree AV Block was present. Bundle Branch Block/IVCD was present. 1 run of Ventricular Tachycardia occurred lasting 8 beats with a max rate of 152 bpm (avg 114 bpm). 10 Supraventricular Tachycardia runs occurred, the run with the fastest interval lasting 19 beats with a max rate of 152 bpm (avg 110 bpm); the run with the fastest interval was also the longest. Isolated SVEs were rare  (<1.0%), SVE Couplets were rare (<1.0%), and SVE Triplets were rare (<1.0%). Isolated VEs were rare (<1.0%), VE Couplets were rare (<1.0%), and no VE Triplets were present. Ventricular Trigeminy was present.  11 patient triggered events, corresponding sinus rhythm  PACs    ASSESSMENT AND PLAN: This is a very pleasant 77 years old white male presented for evaluation of splenomegaly and was found also to have thrombocytopenia.  The repeat ultrasound of the abdomen showed normal spleen but there was a suspicious hypoechoic lesion in the right lobe of the liver.  The patient has history of alcohol abuse in the past and hepatocellular carcinoma could not be completely ruled out at this point.  He was supposed to have MRI of the liver but with the foreign material in his eye he would not be able to get the MRI done.  He had CT of the abdomen and pelvis performed earlier today at the urology center but the final report is still pending. I will wait for the final report of the scan and if there is suspicious lesion in the liver, I will consider The patient for ultrasound-guided core biopsy of this lesion to rule out malignancy. If the scan is negative for any concerning findings, I will see him back for follow-up visit in 3 months with repeat blood work. The patient was advised to call immediately if he has any other concerning symptoms in the interval. The patient voices understanding of current disease status and treatment options and is in agreement with the current care plan.  All questions were answered. The patient knows to call the clinic with any problems, questions or concerns. We can certainly see the patient much sooner if necessary. The total time spent in the appointment was 30 minutes.  Disclaimer: This note was dictated with voice recognition software. Similar sounding words can inadvertently be transcribed and may not be corrected upon review.

## 2022-12-13 ENCOUNTER — Other Ambulatory Visit: Payer: Self-pay | Admitting: Family Medicine

## 2022-12-13 ENCOUNTER — Ambulatory Visit (INDEPENDENT_AMBULATORY_CARE_PROVIDER_SITE_OTHER): Payer: Medicare HMO | Admitting: Neurology

## 2022-12-13 ENCOUNTER — Other Ambulatory Visit: Payer: Self-pay | Admitting: Cardiology

## 2022-12-13 DIAGNOSIS — Z9981 Dependence on supplemental oxygen: Secondary | ICD-10-CM

## 2022-12-13 DIAGNOSIS — E669 Obesity, unspecified: Secondary | ICD-10-CM

## 2022-12-13 DIAGNOSIS — R051 Acute cough: Secondary | ICD-10-CM

## 2022-12-13 DIAGNOSIS — I5042 Chronic combined systolic (congestive) and diastolic (congestive) heart failure: Secondary | ICD-10-CM

## 2022-12-13 DIAGNOSIS — G4733 Obstructive sleep apnea (adult) (pediatric): Secondary | ICD-10-CM | POA: Diagnosis not present

## 2022-12-13 DIAGNOSIS — G4739 Other sleep apnea: Secondary | ICD-10-CM

## 2022-12-13 DIAGNOSIS — R9431 Abnormal electrocardiogram [ECG] [EKG]: Secondary | ICD-10-CM

## 2022-12-13 DIAGNOSIS — G4720 Circadian rhythm sleep disorder, unspecified type: Secondary | ICD-10-CM

## 2022-12-13 DIAGNOSIS — G4734 Idiopathic sleep related nonobstructive alveolar hypoventilation: Secondary | ICD-10-CM

## 2022-12-14 ENCOUNTER — Encounter: Payer: Self-pay | Admitting: Family Medicine

## 2022-12-14 ENCOUNTER — Ambulatory Visit (INDEPENDENT_AMBULATORY_CARE_PROVIDER_SITE_OTHER): Payer: Medicare HMO | Admitting: Family Medicine

## 2022-12-14 VITALS — BP 111/71 | HR 95 | Temp 98.8°F | Ht 66.0 in | Wt 204.4 lb

## 2022-12-14 DIAGNOSIS — S0085XA Superficial foreign body of other part of head, initial encounter: Secondary | ICD-10-CM

## 2022-12-14 MED ORDER — METOPROLOL SUCCINATE ER 25 MG PO TB24: 25.0000 mg | ORAL_TABLET | Freq: Every day | ORAL | 1 refills | Status: DC

## 2022-12-14 NOTE — Progress Notes (Signed)
Subjective: CC: Need for surgical referral PCP: Raliegh Ip, DO WGN:[id number] W Voeltz is a 77 y.o. male presenting to clinic today for:  1.  Metal in face Patient reports that several years ago he was hammering something and a piece of metal flew into the left cheek.  He has had it for many years despite having been evaluated after it occurred.  He is able to take a magnet and show me exactly where the metal is.  He presents today because he needs to get an MRI.  His cardiologist is planning for potential pacemaker and he is even being considered for OSA implant treatment.  However, he cannot get the MRI due to this metal being persistently in his left cheek.  He denies any pain or swelling.  He just would like to expedite his care so he is here today for referral   ROS: Per HPI  No Known Allergies Past Medical History:  Diagnosis Date   Arthritis    knees, shoulders, elbows   Bladder cancer [facility])    urologist-  dr Mena Goes   CHF (congestive heart failure) (HCC)    Diverticulosis of colon    Fibromyalgia    GERD (gastroesophageal reflux disease)    History of diverticulitis of colon    History of gastric ulcer    due to aleve   Hypertension    Lower urinary tract symptoms (LUTS)    OSA (obstructive sleep apnea)    NON-COMPLIANT  CPAP  --- BUT PT USES OXYGEN AT NIGHT 2.5L VIA [place] (PT'S DECISION)   PONV (postoperative nausea and vomiting)    Psoriasis    Sepsis (HCC)    january 2021   Tinnitus    right ear more, has tranmitter in right ear removable at hs   Wears glasses    Wears partial dentures     Current Outpatient Medications:    atorvastatin (LIPITOR) 20 MG tablet, Take 1 tablet (20 mg total) by mouth daily., Disp: 90 tablet, Rfl: 3   benzonatate (TESSALON) 100 MG capsule, Take 1 capsule (100 mg total) by mouth 3 (three) times daily as needed for cough., Disp: 20 capsule, Rfl: 0   fluticasone (FLONASE) 50 MCG/ACT nasal spray, Place 1 spray into both nostrils  daily., Disp: 16 g, Rfl: 0   furosemide (LASIX) 20 MG tablet, Take 1 tablet (20 mg) daily as needed if you gain more than 3 pounds in 1 day or 5 pounds in 1 week., Disp: 45 tablet, Rfl: 3   levothyroxine (SYNTHROID) 25 MCG tablet, Take 1 tablet (25 mcg total) by mouth daily before breakfast. Take on an empty stomach with water only.  Wait to consume other foods/ drinks/ meds for 30 minutes., Disp: 90 tablet, Rfl: 3   losartan (COZAAR) 25 MG tablet, Take 1 tablet (25 mg total) by mouth daily., Disp: 30 tablet, Rfl: 3   metoprolol succinate (TOPROL-XL) 25 MG 24 hr tablet, Take 1 tablet (25 mg total) by mouth daily., Disp: 30 tablet, Rfl: 1   polyvinyl alcohol (LIQUIFILM TEARS) 1.4 % ophthalmic solution, Place 1 drop into both eyes as needed for dry eyes., Disp: 15 mL, Rfl: 0   spironolactone (ALDACTONE) 25 MG tablet, Take 0.5 tablets (12.5 mg total) by mouth daily., Disp: 90 tablet, Rfl: 3 Social History   Socioeconomic History   Marital status: Married    Spouse name: Peggy   Number of children: 2   Years of education: 14   Highest education level: Tax adviser  degree: occupational, technical, or vocational program  Occupational History   Occupation: retired    Comment: 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   Tobacco Use   Smoking status: Former    Current packs/day: 0.00    Average packs/day: 2.0 packs/day for 40.0 years (80.0 ttl pk-yrs)    Types: Cigarettes    Start date: 06/01/1957    Quit date: 06/01/1997    Years since quitting: 25.5   Smokeless tobacco: Never  Vaping Use   Vaping status: Never Used  Substance and Sexual Activity   Alcohol use: No   Drug use: No   Sexual activity: Not Currently  Other Topics Concern   Not on file  Social History Narrative   One level home with wife    16/5519 - 49 year old MIL lives with them   Caffiene 2 cups daily, tea occas.   Retired, Visual merchandiser   Social Determinants of Corporate investment banker Strain: Medium Risk (12/11/2022)   Overall Financial  Resource Strain (CARDIA)    Difficulty of Paying Living Expenses: Somewhat hard  Food Insecurity: No Food Insecurity (12/11/2022)   Hunger Vital Sign    Worried About Running Out of Food in the Last Year: Never true    Ran Out of Food in the Last Year: Never true  Transportation Needs: No Transportation Needs (12/11/2022)   PRAPARE - Administrator, Civil Service (Medical): No    Lack of Transportation (Non-Medical): No  Physical Activity: Inactive (12/11/2022)   Exercise Vital Sign    Days of Exercise per Week: 0 days    Minutes of Exercise per Session: 0 min  Stress: No Stress Concern Present (12/11/2022)   [employer] of Occupational Health - Occupational Stress Questionnaire    Feeling of Stress : Not at all  Social Connections: Socially Integrated (12/11/2022)   Social Connection and Isolation Panel [NHANES]    Frequency of Communication with Friends and Family: Three times a week    Frequency of Social Gatherings with Friends and Family: Once a week    Attends Religious Services: 1 to 4 times per year    Active Member of [employer] or Organizations: Yes    Attends Banker Meetings: 1 to 4 times per year    Marital Status: Married  Catering manager Violence: Not At Risk (10/07/2022)   Humiliation, Afraid, Rape, and Kick questionnaire    Fear of Current or Ex-Partner: No    Emotionally Abused: No    Physically Abused: No    Sexually Abused: No   Family History  Problem Relation Age of Onset   Alzheimer's disease Mother    Heart attack Father    Heart disease Father    Irregular heart beat Brother        DEFIB.   Congestive Heart Failure Brother    Diabetes Daughter     Objective: Office vital signs reviewed. BP 111/71   Pulse 95   Temp 98.8 F (37.1 C)   Ht 5\' 6"  (1.676 m)   Wt 204 lb 6.4 oz (92.7 kg)   SpO2 95%   BMI 32.99 kg/m   Physical Examination:  General: Awake, alert, well nourished, No acute distress Skin: BB sized piece of metal  appreciated in the left upper cheek just below the left eyelid.  This is very easily appreciated when utilizing magnet to cause it to raise up  Assessment/ Plan: 77 y.o. male   Metal foreign body in cheek - Plan: Ambulatory referral to  Dermatology  Stat referral to dermatology placed.  He certainly has a BB sized piece of metal within the left cheek that is just below the left eyelid.  For this reason I will defer to dermatology for removal as I did not want to compromise any of his nerves in this area.  I do question maybe ENT might be over to remove this and encouraged him to contact Dr. Pollyann Kennedy to see if that might be possible as he may be able to get in there sooner than waiting for dermatology referral  No orders of the defined types were placed in this encounter.  No orders of the defined types were placed in this encounter.    Raliegh Ip, DO [facility] [phone]

## 2022-12-15 NOTE — Procedures (Unsigned)
Physician Interpretation:    Referred by: Raliegh Ip, DO    History and Indication for Testing: 77 year old male with an underlying complex medical history of chronic systolic and diastolic congestive heart failure, bladder cancer, diverticulitis, history of gastric ulcer, reflux disease, thyroid nodule, arthritis, fibromyalgia, psoriasis, history of sepsis, tinnitus, and obesity, multiple surgeries including left humerus ORIF, tonsillectomy, appendectomy, prostate surgery, bladder cancer surgery, ostomy placement, colostomy placement with subsequent reversal, bilateral knee replacements, right shoulder replacement, who reports snoring and excessive daytime somnolence. His Epworth sleepiness score is 9 of 24, fatigue severity score is 35, pulse 63. He was previously diagnosed with obstructive sleep apnea but has not been on PAP therapy in several years.  He reports discomfort with the mask and the machine.  He has been on home oxygen therapy at night for years.   TITRATION DETAILS (SEE ALSO TABLE AT THE END OF THE REPORT):  The patient qualified for an emergency split-night sleep study per AASM criteria secondary to severe sleep apnea with a AHI of 53.4/h, O2 nadir 79%.  He mostly slept in a reclining chair.  He is used to sleeping in a recliner for many years.  He was shown several different interfaces and was subsequently fitted with a medium fullface mask from [employer] ([employer]). The patient was started on a pressure of 5 cm of water pressure and gradually titrated to a final titration pressure of 12 cm and subsequently switched to a standard BiPAP of 14/10 cm.  He had increase in central apneas once he was on CPAP of 12 cm.  He had ongoing central apneas on BiPAP of 14/10 cm.  Ultimately, a optimize treatment setting was not achieved during the study due to lack of time, particularly because sleep onset was delayed at the beginning of the study.  On the final BiPAP pressure of 14/10 cm he  achieved 50 minutes of total sleep time with a residual AHI of 30/h, primarily due to central events.  A designated CPAP/BiPAP titration study is recommended to optimize its treatment settings.  His oxygen desaturation nadir on the final BiPAP was 90% with supine REM sleep achieved.  Supplemental oxygen was not utilized during this study.   EEG: Review of the EEG showed no abnormal electrical discharges and symmetrical bihemispheric findings.     EKG: The EKG revealed normal sinus rhythm (NSR). ***   AUDIO/VIDEO REVIEW: The audio and video review did not show any abnormal or unusual behaviors, movements, phonations or vocalizations. The patient took *** restroom breaks. Snoring was noted, mostly mild and intermittent.   POST-STUDY QUESTIONNAIRE: Post study, the patient indicated, that sleep was worse than usual.    IMPRESSION:   Severe Obstructive Sleep Apnea (OSA) Treatment emergent Central Sleep Apnea (CSA) Dysfunctions associated with sleep stages or arousal from sleep  RECOMMENDATIONS:   The patient qualified for an emergency split-night sleep study per AASM criteria secondary to severe sleep apnea with a total AHI of 53.4/h, O2 nadir 79%.  He mostly slept in a reclining chair during this study.  An optimal PAP setting was not achieved during this study.  He was titrated from CPAP of 5 cm to 12 cm and tried on standard BiPAP therapy of 14/10 cm.  He had significant treatment emergent central apneas.   On the final BiPAP pressure of 14/10 cm he achieved 50 minutes of total sleep time with a residual AHI of 30/h, primarily due to central events.  A designated CPAP/BiPAP titration study may help determine  his optimal treatment modality and settings.  His oxygen desaturation nadir on the final BiPAP was 90% with supine REM sleep achieved.  Supplemental oxygen was not utilized during this study.   For now, the patient will be advised to start home AutoPap therapy without supplemental oxygen.   The patient will be advised to be fully compliant with PAP therapy to improve sleep related symptoms and decrease long term cardiovascular risks.  Please note, that untreated obstructive sleep apnea may carry additional perioperative morbidity. Patients with significant obstructive sleep apnea should receive perioperative PAP therapy and the surgeons and particularly the anesthesiologist should be informed of the diagnosis and the severity of the sleep disordered breathing. Concomitant weight loss is recommended. This study shows sleep fragmentation and abnormal sleep stage percentages; these are nonspecific findings and per se do not signify an intrinsic sleep disorder or a cause for the patient's sleep-related symptoms. Causes include (but are not limited to) the first night effect of the sleep study, circadian rhythm disturbances, medication effect or an underlying mood disorder or medical problem.  The patient should be cautioned not to drive, work at heights, or operate dangerous or heavy equipment when tired or sleepy. Review and reiteration of good sleep hygiene measures should be pursued with any patient. The patient will be seen in follow-up in the sleep clinic at [facility] for discussion of the test results, symptom and treatment compliance review, further management strategies, etc. The referring provider will be notified of the test results.   I certify that I have reviewed the entire raw data recording prior to the issuance of this report in accordance with the Standards of Accreditation of the American Academy of Sleep Medicine (AASM).   Huston Foley, MD, PhD Medical Director, [facility]) Diplomat, ABPN (Neurology and Sleep)       I certify that I have reviewed the entire raw data recording prior to the issuance of this report in accordance with the Standards of Accreditation of the American Academy of Sleep Medicine (AASM).   Huston Foley, MD,  PhD Medical Director, [facility]) Diplomat, ABPN (Neurology and Sleep)   Technical Report:   ***  Titration Table:  ***

## 2022-12-16 ENCOUNTER — Telehealth: Payer: Self-pay

## 2022-12-16 DIAGNOSIS — H52223 Regular astigmatism, bilateral: Secondary | ICD-10-CM | POA: Diagnosis not present

## 2022-12-16 DIAGNOSIS — H2513 Age-related nuclear cataract, bilateral: Secondary | ICD-10-CM | POA: Diagnosis not present

## 2022-12-16 DIAGNOSIS — H524 Presbyopia: Secondary | ICD-10-CM | POA: Diagnosis not present

## 2022-12-16 DIAGNOSIS — H5203 Hypermetropia, bilateral: Secondary | ICD-10-CM | POA: Diagnosis not present

## 2022-12-16 NOTE — Telephone Encounter (Addendum)
Stenson, Melissa  Carman Essick D, CMA; Kathe Becton; Felipa Furnace, Lenon Curt, Melissa Got it. Thank you!       Previous Messages    ----- Message ----- From: Bobbye Morton, CMA Sent: 12/17/2022   8:17 AM EDT To: Henderson Newcomer; Santina Evans; Kathe Becton Subject: URGENT autopap         Bobbye Morton, CMA  Durene Cal, Melissa Urgent New orders have been placed for the above pt, DOB: 06/06/1945 Thank

## 2022-12-16 NOTE — Telephone Encounter (Addendum)
I called pt. I advised pt that Dr. Frances Furbish reviewed their sleep study results and found that pt has sever OSA.  Dr. Frances Furbish recommends that pt starts autopap. I reviewed PAP compliance expectations with the pt. Pt is agreeable to starting a CPAP. I advised pt that an order will be sent to a DME, [facility], and [facility] will call the pt within about one week after they file with the pt's insurance. [facility] will show the pt how to use the machine, fit for masks, and troubleshoot the CPAP if needed. A follow up appt was made for insurance purposes with Tylene Fantasia 02/18/23. Pt verbalized understanding to arrive 15 minutes early and bring their CPAP. Pt verbalized understanding of results. Pt had no questions at this time but was encouraged to call back if questions arise. I have sent the order to [facility] and have received confirmation that they have received the order. Results sent to referring provider.

## 2022-12-16 NOTE — Addendum Note (Signed)
Addended by: Huston Foley on: 12/16/2022 01:36 PM   Modules accepted: Orders

## 2022-12-16 NOTE — Telephone Encounter (Signed)
-----   Message from Huston Foley sent at 12/16/2022  1:36 PM EDT ----- Urgent set up requested on PAP therapy, due to severe OSA. Patient referred by PCP for re-eval of his OSA (not on PAP therapy but using O2 at home), seen by me on 11/10/22, diagnostic PSG on 12/13/22.    Please call and notify the patient that the recent sleep study showed severe obstructive sleep apnea. I recommend treatment for in the form of autoPAP. We may consider at a CPAP titration study at a later date. We tried CPAP and BiPAP during this study but did not find an optimized pressure setting (yet). Supplemental oxygen was not needed with PAP therapy. We may ask him to come back for a second sleep study with CPAP or BiPAP treatment. For now, I would like to start him on a so-called autoPAP machine at home, through a DME company (of his choice, or as per insurance requirement). The DME representative will educate him on how to use the machine, how to put the mask on, etc. I have placed an order in the chart. Please send referral, talk to patient, send report to referring MD. We will need a FU in sleep clinic for 10 weeks post-PAP set up, please arrange that with me or one of our NPs. Thanks,   Huston Foley, MD, PhD [facility])

## 2022-12-17 DIAGNOSIS — C678 Malignant neoplasm of overlapping sites of bladder: Secondary | ICD-10-CM | POA: Diagnosis not present

## 2022-12-23 ENCOUNTER — Ambulatory Visit: Payer: Medicare HMO | Attending: Internal Medicine | Admitting: Pharmacist

## 2022-12-23 ENCOUNTER — Encounter: Payer: Self-pay | Admitting: Pharmacist

## 2022-12-23 VITALS — BP 106/67 | HR 65 | Wt 212.0 lb

## 2022-12-23 DIAGNOSIS — I5020 Unspecified systolic (congestive) heart failure: Secondary | ICD-10-CM | POA: Diagnosis not present

## 2022-12-23 NOTE — Patient Instructions (Addendum)
It was nice meeting you today  We would like to keep your blood pressure less than 130/80  Please continue your:  Losartan 25mg  daily Spironolactone 12.5mg  daily Metoprolol 25mg  daily Furosemide 20mg  as needed  Continue to monitor your blood pressure and weight and low salt diet  Follow up with Dr Bjorn Pippin next week  Laural Golden, PharmD, BCACP, CDCES, CPP [address], [address] [place], [place], [address] Phone: [phone], Fax: [phone]

## 2022-12-23 NOTE — Progress Notes (Signed)
Patient ID: Billy Rasmussen                 DOB: [birth year]                      MRN: [id number]     HPI: Billy Rasmussen is a 77 y.o. male referred by Dr. Bjorn Pippin to pharmacy clinic for HF medication management. PMH is significant for HTN, CAD, CHF, and bladder cancer. Most recent LVEF <20% on 10/07/22.  Patient currently referred for liver MRI but can not have completed yet due to a piece of metal embedded under his eye. Has referral to dermatology for removal.  Patient presents today with wife Billy Rasmussen. At last PharmD visit patient was started on low dose spironolactone. No patient reported adverse effects.  GDMT includes losartan, spironolactone, metoprolol and furosemide as needed. Jardiance/Farxiga being avoided due to frequent UTIs. Weighs himself daily and has not noticed any significant weight gain or loss. Blood pressure at home has been well controlled. Patient reports he feels well  Patient denies dizziness, fatigue, LE edema, or SOB. Able to complete all ADLs.   Current CHF meds:  Losartan 25mg  daily Furosemide 20mg  daily Spironolactone 12.5mg  daily Metoprolol 25mg  daily  BP goal: <130/80  Wt Readings from Last 3 Encounters:  12/14/22 204 lb 6.4 oz (92.7 kg)  12/10/22 210 lb 14.4 oz (95.7 kg)  12/08/22 211 lb 6.4 oz (95.9 kg)   BP Readings from Last 3 Encounters:  12/14/22 111/71  12/10/22 113/69  12/08/22 120/76   Pulse Readings from Last 3 Encounters:  12/14/22 95  12/10/22 71  12/08/22 72    Renal function: CrCl cannot be calculated (Patient's most recent lab result is older than the maximum 21 days allowed.).  Past Medical History:  Diagnosis Date   Arthritis    knees, shoulders, elbows   Bladder cancer [facility])    urologist-  dr Mena Goes   CHF (congestive heart failure) (HCC)    Diverticulosis of colon    Fibromyalgia    GERD (gastroesophageal reflux disease)    History of diverticulitis of colon    History of gastric ulcer    due to aleve    Hypertension    Lower urinary tract symptoms (LUTS)    OSA (obstructive sleep apnea)    NON-COMPLIANT  CPAP  --- BUT PT USES OXYGEN AT NIGHT 2.5L VIA [place] (PT'S DECISION)   PONV (postoperative nausea and vomiting)    Psoriasis    Sepsis (HCC)    january 2021   Tinnitus    right ear more, has tranmitter in right ear removable at hs   Wears glasses    Wears partial dentures     Current Outpatient Medications on File Prior to Visit  Medication Sig Dispense Refill   amoxicillin (AMOXIL) 500 MG capsule Take 1,000 mg by mouth 2 (two) times daily.     atorvastatin (LIPITOR) 20 MG tablet Take 1 tablet (20 mg total) by mouth daily. 90 tablet 3   benzonatate (TESSALON) 100 MG capsule Take 1 capsule (100 mg total) by mouth 3 (three) times daily as needed for cough. 20 capsule 0   fluticasone (FLONASE) 50 MCG/ACT nasal spray SPRAY 2 SPRAYS INTO EACH NOSTRIL EVERY DAY 48 mL 2   furosemide (LASIX) 20 MG tablet Take 1 tablet (20 mg) daily as needed if you gain more than 3 pounds in 1 day or 5 pounds in 1 week. 45 tablet 3   levothyroxine (SYNTHROID)  25 MCG tablet Take 1 tablet (25 mcg total) by mouth daily before breakfast. Take on an empty stomach with water only.  Wait to consume other foods/ drinks/ meds for 30 minutes. 90 tablet 3   losartan (COZAAR) 25 MG tablet Take 1 tablet (25 mg total) by mouth daily. 90 tablet 1   metoprolol succinate (TOPROL-XL) 25 MG 24 hr tablet Take 1 tablet (25 mg total) by mouth daily. 90 tablet 1   polyvinyl alcohol (LIQUIFILM TEARS) 1.4 % ophthalmic solution Place 1 drop into both eyes as needed for dry eyes. 15 mL 0   spironolactone (ALDACTONE) 25 MG tablet Take 0.5 tablets (12.5 mg total) by mouth daily. 90 tablet 3   No current facility-administered medications on file prior to visit.    No Known Allergies   Assessment/Plan:  1. CHF -  Patient BP in room 106/67 which is at goal of <130/80 and patient feels well. No medication changes needed at this time.  Advised patient to continue home monitoring of BP and weights and to avoid excess salt consumption. Patient voiced understanding.  Continue: Losartan 25mg  daily Furosemide 20mg  daily prn Spironolactone 12.5mg  daily Metoprolol 25mg  daily Follow up with Dr Bjorn Pippin in 1 week  Laural Golden, PharmD, BCACP, CDCES, CPP [address] [place], [place], [address] Phone: [phone], Fax: [phone]

## 2022-12-25 DIAGNOSIS — Z936 Other artificial openings of urinary tract status: Secondary | ICD-10-CM | POA: Diagnosis not present

## 2022-12-25 DIAGNOSIS — Z8551 Personal history of malignant neoplasm of bladder: Secondary | ICD-10-CM | POA: Diagnosis not present

## 2022-12-28 NOTE — Progress Notes (Unsigned)
Cardiology Office Note:    Date:  12/29/2022   ID:  Katriel, Hailes 1945-08-21, MRN [id number]  PCP:  Raliegh Ip, DO  Cardiologist:  Nicki Guadalajara, MD  Electrophysiologist:  None   Referring MD: Raliegh Ip, DO   Chief Complaint  Patient presents with   Congestive Heart Failure    History of Present Illness:    Billy Rasmussen is a 77 y.o. male with a hx of chronic combined systolic and diastolic heart failure, bladder cancer s/p left nephrectomy, cystoprostatectomy , fibromyalgia, hypertension, OSA not on CPAP, psoriasis who presents for follow-up.  He was initially seen as an ED follow-up on 10/06/2022.  He was seen in the ED on 09/27/2022 for shortness of breath.  Chest x-ray shows interstitial edema and mildly elevated BNP.  CTPA showed no PE but findings suggestive of pulmonary edema, as well as 4.5 cm ascending aortic aneurysm.  He was given Lasix with improvement and discharged on p.o. Lasix 20 mg daily with referral to cardiology.  Echocardiogram 04/2019 showed EF 55-60%, ascending aortic aneurysm measuring 45 mm, normal RV function, no significant valvular disease.  At initial clinic visit on 10/06/2022, he reported fever and was hypotensive.  He was sent to the ED.  He was admitted from 5/7 through 10/13/2022.  He was treated for UTI.  Echo 10/17/2022 showed EF less than 20%, mildly reduced RV systolic dysfunction, RVSP 44, severe left atrial enlargement, mild MR, dilated ascending aorta measuring 45 mm.  RHC/LHC on 10/09/2022 showed nonobstructive CAD (30% RCA stenosis, RA 6 RV 50/3, PA 55/26/37 PCWP 27 CI 3.85.  Had AKI during admission, diuresis was held and he was discharged on as needed Lasix.  Zio patch x 10 days 10/2022 showed 1 episode of NSVT lasting 8 beats, 10 episodes of SVT with longest lasting 19 beats.  Since last clinic visit, he reports he is doing okay.  States that he had some chest pain and shortness of breath when walking a long distance at the  hospital recently.  Denies any lightheadedness, syncope, lower extremity edema, or palpitations.  BP Readings from Last 3 Encounters:  12/29/22 (!) 112/58  12/23/22 106/67  12/14/22 111/71   Wt Readings from Last 3 Encounters:  12/29/22 212 lb (96.2 kg)  12/23/22 212 lb (96.2 kg)  12/14/22 204 lb 6.4 oz (92.7 kg)     Past Medical History:  Diagnosis Date   Arthritis    knees, shoulders, elbows   Bladder cancer [facility])    urologist-  dr Mena Goes   CHF (congestive heart failure) (HCC)    Diverticulosis of colon    Fibromyalgia    GERD (gastroesophageal reflux disease)    History of diverticulitis of colon    History of gastric ulcer    due to aleve   Hypertension    Lower urinary tract symptoms (LUTS)    OSA (obstructive sleep apnea)    NON-COMPLIANT  CPAP  --- BUT PT USES OXYGEN AT NIGHT 2.5L VIA [place] (PT'S DECISION)   PONV (postoperative nausea and vomiting)    Psoriasis    Sepsis (HCC)    january 2021   Tinnitus    right ear more, has tranmitter in right ear removable at hs   Wears glasses    Wears partial dentures     Past Surgical History:  Procedure Laterality Date   APPENDECTOMY     COLECTOMY W/ COLOSTOMY  1996   W/   APPENDECTOMY   COLONOSCOPY N/A 05/11/2018  Procedure: COLONOSCOPY;  Surgeon: Corbin Ade, MD;  [facility];  Service: Endoscopy;  Laterality: N/A;  9:30   COLOSTOMY TAKEDOWN  1996   CYSTOSCOPY WITH BIOPSY N/A 12/05/2013   Procedure: CYSTO BLADDER BIOPSY AND FULGERATION;  Surgeon: Jerilee Field, MD;  Location[facility];  Service: Urology;  Laterality: N/A;   CYSTOSCOPY WITH BIOPSY Bilateral 11/13/2014   Procedure: CYSTOSCOPY WITH  BLADDER BIOPSY FULGERATION AND BILATERAL RETROGRADE PYELOGRAMS;  Surgeon: Jerilee Field, MD;  Location: [facility];  Service: Urology;  Laterality: Bilateral;   CYSTOSCOPY WITH FULGERATION N/A 01/18/2018   Procedure: CYSTOSCOPY WITH FULGERATION/ BLADDER BIOPSY;  Surgeon:  Jerilee Field, MD;  Location: [facility];  Service: Urology;  Laterality: N/A;   CYSTOSCOPY WITH INJECTION N/A 11/09/2018   Procedure: CYSTOSCOPY WITH INJECTION OF INDOCYANINE GREEN DYE;  Surgeon: Sebastian Ache, MD;  Location: [facility];  Service: Urology;  Laterality: N/A;   CYSTOSCOPY WITH INSERTION OF UROLIFT N/A 01/18/2018   Procedure: CYSTOSCOPY WITH INSERTION OF UROLIFT;  Surgeon: Jerilee Field, MD;  Location: [facility];  Service: Urology;  Laterality: N/A;   CYSTOSCOPY/URETEROSCOPY/HOLMIUM LASER Left 02/17/2019   Procedure: URETEROSCOPY WITH BALLOON DILATION  AND NEPHROSTOGRAM;  Surgeon: Sebastian Ache, MD;  Location: [facility];  Service: Urology;  Laterality: Left;  1 HR   EXCISION RIGHT UPPER ARM LIPOMA  2005   HEMORROIDECTOMY     INGUINAL HERNIA REPAIR Left 1984   IR NEPHROSTOMY PLACEMENT LEFT  01/05/2019   KNEE ARTHROSCOPY Left X3  LAST ONE  2002   ORIF LEFT HUMEROUS FX  1976   POLYPECTOMY  05/11/2018   Procedure: POLYPECTOMY;  Surgeon: Corbin Ade, MD;  [facility];  Service: Endoscopy;;   PROSTATE SURGERY     RIGHT HEART CATH AND CORONARY ANGIOGRAPHY N/A 10/09/2022   Procedure: RIGHT HEART CATH AND CORONARY ANGIOGRAPHY;  Surgeon: [name], MD;  [facility];  Service: Cardiovascular;  Laterality: N/A;   ROBOT ASSISTED LAPAROSCOPIC NEPHRECTOMY Left 07/14/2019   Procedure: XI ROBOTIC ASSISTED LAPAROSCOPIC RETROPERITONEAL NEPHRECTOMY;  Surgeon: Sebastian Ache, MD;  Location: [facility];  Service: Urology;  Laterality: Left;  3 HRS   TONSILLECTOMY  AS CHILD   TOTAL KNEE ARTHROPLASTY Left 2006   REVISION 2007  (AFTER I & D WITH ANTIBIOTIC SPACER PROCEDURE FOR STEPH INFECTION)   TOTAL KNEE ARTHROPLASTY Right 03/30/2016   Procedure: RIGHT TOTAL KNEE ARTHROPLASTY;  Surgeon: Durene Romans, MD;  Location: [facility];  Service: Orthopedics;  Laterality: Right;   TOTAL SHOULDER ARTHROPLASTY Right 04/06/2013    Procedure: RIGHT TOTAL SHOULDER ARTHROPLASTY;  Surgeon: Senaida Lange, MD;  Location: [facility];  Service: Orthopedics;  Laterality: Right;   TOTAL SHOULDER ARTHROPLASTY Left 07/20/2013   Procedure: LEFT TOTAL SHOULDER ARTHROPLASTY;  Surgeon: Senaida Lange, MD;  Location: [facility];  Service: Orthopedics;  Laterality: Left;   TRANSURETHRAL RESECTION OF BLADDER TUMOR N/A 11/12/2015   Procedure: TRANSURETHRAL RESECTION OF BLADDER TUMOR (TURBT);  Surgeon: Jerilee Field, MD;  Location: [facility];  Service: Urology;  Laterality: N/A;   TRANSURETHRAL RESECTION OF BLADDER TUMOR WITH GYRUS (TURBT-GYRUS) N/A 12/05/2013   Procedure: TRANSURETHRAL RESECTION OF BLADDER TUMOR WITH GYRUS (TURBT-GYRUS);  Surgeon: Jerilee Field, MD;  Location: [facility];  Service: Urology;  Laterality: N/A;    Current Medications: Current Meds  Medication Sig   amoxicillin (AMOXIL) 500 MG capsule Take 1,000 mg by mouth 2 (two) times daily.  atorvastatin (LIPITOR) 20 MG tablet Take 1 tablet (20 mg total) by mouth daily.   benzonatate (TESSALON) 100 MG capsule Take 1 capsule (100 mg total) by mouth 3 (three) times daily as needed for cough.   fluticasone (FLONASE) 50 MCG/ACT nasal spray SPRAY 2 SPRAYS INTO EACH NOSTRIL EVERY DAY   furosemide (LASIX) 20 MG tablet Take 1 tablet (20 mg) daily as needed if you gain more than 3 pounds in 1 day or 5 pounds in 1 week.   levothyroxine (SYNTHROID) 25 MCG tablet Take 1 tablet (25 mcg total) by mouth daily before breakfast. Take on an empty stomach with water only.  Wait to consume other foods/ drinks/ meds for 30 minutes.   losartan (COZAAR) 25 MG tablet Take 1 tablet (25 mg total) by mouth daily.   metoprolol succinate (TOPROL-XL) 25 MG 24 hr tablet Take 1 tablet (25 mg total) by mouth daily.   polyvinyl alcohol (LIQUIFILM TEARS) 1.4 % ophthalmic solution Place 1 drop into both eyes as needed for dry eyes.   spironolactone (ALDACTONE) 25 MG tablet Take 0.5  tablets (12.5 mg total) by mouth daily.     Allergies:   Patient has no known allergies.   Social History   Socioeconomic History   Marital status: Married    Spouse name: Peggy   Number of children: 2   Years of education: 14   Highest education level: Tax adviser degree: occupational, Scientist, product/process development, or vocational program  Occupational History   Occupation: retired    Comment: 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   Tobacco Use   Smoking status: Former    Current packs/day: 0.00    Average packs/day: 2.0 packs/day for 40.0 years (80.0 ttl pk-yrs)    Types: Cigarettes    Start date: 06/01/1957    Quit date: 06/01/1997    Years since quitting: 25.5   Smokeless tobacco: Never  Vaping Use   Vaping status: Never Used  Substance and Sexual Activity   Alcohol use: No   Drug use: No   Sexual activity: Not Currently  Other Topics Concern   Not on file  Social History Narrative   One level home with wife    07/2465 - 53 year old MIL lives with them   Caffiene 2 cups daily, tea occas.   Retired, Visual merchandiser   Social Determinants of Corporate investment banker Strain: Medium Risk (12/11/2022)   Overall Financial Resource Strain (CARDIA)    Difficulty of Paying Living Expenses: Somewhat hard  Food Insecurity: No Food Insecurity (12/11/2022)   Hunger Vital Sign    Worried About Running Out of Food in the Last Year: Never true    Ran Out of Food in the Last Year: Never true  Transportation Needs: No Transportation Needs (12/11/2022)   PRAPARE - Administrator, Civil Service (Medical): No    Lack of Transportation (Non-Medical): No  Physical Activity: Inactive (12/11/2022)   Exercise Vital Sign    Days of Exercise per Week: 0 days    Minutes of Exercise per Session: 0 min  Stress: No Stress Concern Present (12/11/2022)   [employer] of Occupational Health - Occupational Stress Questionnaire    Feeling of Stress : Not at all  Social Connections: Socially Integrated (12/11/2022)   Social  Connection and Isolation Panel [NHANES]    Frequency of Communication with Friends and Family: Three times a week    Frequency of Social Gatherings with Friends and Family: Once a week    Attends Religious Services:  1 to 4 times per year    Active Member of Clubs or Organizations: Yes    Attends Banker Meetings: 1 to 4 times per year    Marital Status: Married     Family History: The patient's family history includes Alzheimer's disease in his mother; Congestive Heart Failure in his brother; Diabetes in his daughter; Heart attack in his father; Heart disease in his father; Irregular heart beat in his brother.  ROS:   Please see the history of present illness.     All other systems reviewed and are negative.  EKGs/Labs/Other Studies Reviewed:    The following studies were reviewed today:   EKG:   10/06/22: Sinus rhythm with first-degree AV block, rate 88, nonspecific intraventricular conduction delay, right axis deviation  Recent Labs: 09/16/2022: TSH 2.090 10/11/2022: B Natriuretic Peptide 183.9; Magnesium 2.0 11/11/2022: ALT 25 12/01/2022: BUN 24; Creatinine, Ser 1.40; Potassium 4.7; Sodium 140 12/10/2022: Hemoglobin 13.9; Platelet Count 142  Recent Lipid Panel    Component Value Date/Time   CHOL 152 07/07/2022 0916   TRIG 171 (H) 07/07/2022 0916   HDL 28 (L) 07/07/2022 0916   CHOLHDL 5.4 (H) 07/07/2022 0916   LDLCALC 94 07/07/2022 0916   LDLDIRECT 96 02/25/2016 1352    Physical Exam:    VS:  BP (!) 112/58 (BP Location: Left Arm, Patient Position: Sitting, Cuff Size: Normal)   Pulse 81   Ht 5\' 6"  (1.676 m)   Wt 212 lb (96.2 kg)   SpO2 98%   BMI 34.22 kg/m     Wt Readings from Last 3 Encounters:  12/29/22 212 lb (96.2 kg)  12/23/22 212 lb (96.2 kg)  12/14/22 204 lb 6.4 oz (92.7 kg)     GEN:  Well nourished, well developed in no acute distress HEENT: Normal NECK: No JVD; No carotid bruits CARDIAC: RRR, no murmurs, rubs, gallops RESPIRATORY:  Clear to  auscultation without rales, wheezing or rhonchi  ABDOMEN: Soft, non-tender, non-distended MUSCULOSKELETAL:  No edema; No deformity  SKIN: Warm and dry NEUROLOGIC:  Alert and oriented x 3 PSYCHIATRIC:  Normal affect   ASSESSMENT:    1. Chronic combined systolic and diastolic heart failure (HCC)   2. Coronary artery disease involving native coronary artery of native heart, unspecified whether angina present   3. CKD stage 3a, GFR 45-59 ml/min (HCC)   4. Medication management   5. Aneurysm of ascending aorta without rupture (HCC)   6. Essential hypertension      PLAN:    Chronic combined heart failure: He was seen in the ED on 09/27/2022 for shortness of breath.  Chest x-ray shows interstitial edema and mildly elevated BNP.  CTPA showed no PE but findings suggestive of pulmonary edema, as well as 4.5 cm ascending aortic aneurysm.  He was given Lasix with improvement and discharged on p.o. Lasix 20 mg daily with referral to cardiology.  He was admitted 10/06/22 with fever/hypotension found of UTI.  Echo 10/17/2022 showed EF less than 20%, mildly reduced RV systolic dysfunction, RVSP 44, severe left atrial enlargement, mild MR, dilated ascending aorta measuring 45 mm.  RHC/LHC on 10/09/2022 showed nonobstructive CAD (30% RCA stenosis, RA 6 RV 50/3, PA 55/26/37 PCWP 27 CI 3.85.  Had AKI during admission, diuresis was held and he was discharged on as needed Lasix. -Continue Lasix 20 mg daily as needed.  Would monitor daily weights and take Lasix if gains more than 3 pounds in 1 day or 5 pounds in 1 week -Continue  Toprol-XL 25 mg daily -Continue losartan 25 mg daily.   -Continue spironolactone 12.5 mg daily -Hold off on SGLT2 inhibitor given frequent UTIs -Ideally would obtain cardiac MRI to evaluate etiology of nonischemic cardiomyopathy.  However he has piece of metal in his eye and is unable to obtain MRI -EKG with left bundle branch blocks with QRS greater than 160; possible LBBB induced  cardiomyopathy.  Has been referred to EP, has follow-up echo scheduled in August and EF remains less than 35% considering CRT -Check BMET/magnesium  CAD: LHC on 10/09/2022 showed nonobstructive CAD with 30% RCA stenosis. -LDL 94 on 07/07/2022, goal less than 70.  Started atorvastatin 20 mg daily  Palpitations: Zio patch x 10 days 10/2022 showed 1 episode of NSVT lasting 8 beats, 10 episodes of SVT with longest lasting 19 beats. -Continue Toprol-XL 25 mg daily  Aortic aneurysm: Ascending aortic aneurysm measuring 45 mm on CTPA 08/2022.  Will monitor  CKD 3A: Has history of left nephrectomy due to bladder cancer.  Baseline creatinine appears 1.3-1.5.  Creatinine was up to 1.8 during admission 09/2022.  Most recent creatinine 1.4 on 12/01/2022  Hypertension: On Toprol-XL, losartan, and spironolactone as above  OSA: starting CPAP  RTC in 3 months  Medication Adjustments/Labs and Tests Ordered: Current medicines are reviewed at length with the patient today.  Concerns regarding medicines are outlined above.  Orders Placed This Encounter  Procedures   Basic metabolic panel   Magnesium   No orders of the defined types were placed in this encounter.   Patient Instructions  Medication Instructions:  No changes *If you need a refill on your cardiac medications before your next appointment, please call your pharmacy*   Lab Work: BMP, Magnesium If you have labs (blood work) drawn today and your tests are completely normal, you will receive your results only by: MyChart Message (if you have MyChart) OR A paper copy in the mail If you have any lab test that is abnormal or we need to change your treatment, we will call you to review the results.  Follow-Up: At [facility], you and your health needs are our priority.  As part of our continuing mission to provide you with exceptional heart care, we have created designated Provider Care Teams.  These Care Teams include your primary  Cardiologist (physician) and Advanced Practice Providers (APPs -  Physician Assistants and Nurse Practitioners) who all work together to provide you with the care you need, when you need it.  We recommend signing up for the patient portal called "MyChart".  Sign up information is provided on this After Visit Summary.  MyChart is used to connect with patients for Virtual Visits (Telemedicine).  Patients are able to view lab/test results, encounter notes, upcoming appointments, etc.  Non-urgent messages can be sent to your provider as well.   To learn more about what you can do with MyChart, go to [email or web address].    Your next appointment:   3 month(s)  Provider:   Dr Bjorn Pippin    Signed, Little Ishikawa, MD  12/29/2022 5:35 PM    [facility]

## 2022-12-29 ENCOUNTER — Ambulatory Visit: Payer: Medicare HMO | Attending: Cardiology | Admitting: Cardiology

## 2022-12-29 VITALS — BP 112/58 | HR 81 | Ht 66.0 in | Wt 212.0 lb

## 2022-12-29 DIAGNOSIS — Z79899 Other long term (current) drug therapy: Secondary | ICD-10-CM

## 2022-12-29 DIAGNOSIS — I10 Essential (primary) hypertension: Secondary | ICD-10-CM

## 2022-12-29 DIAGNOSIS — N1831 Chronic kidney disease, stage 3a: Secondary | ICD-10-CM

## 2022-12-29 DIAGNOSIS — I7121 Aneurysm of the ascending aorta, without rupture: Secondary | ICD-10-CM

## 2022-12-29 DIAGNOSIS — I5042 Chronic combined systolic (congestive) and diastolic (congestive) heart failure: Secondary | ICD-10-CM

## 2022-12-29 DIAGNOSIS — I2510 Atherosclerotic heart disease of native coronary artery without angina pectoris: Secondary | ICD-10-CM

## 2022-12-29 NOTE — Patient Instructions (Signed)
Medication Instructions:  No changes *If you need a refill on your cardiac medications before your next appointment, please call your pharmacy*   Lab Work: BMP, Magnesium If you have labs (blood work) drawn today and your tests are completely normal, you will receive your results only by: MyChart Message (if you have MyChart) OR A paper copy in the mail If you have any lab test that is abnormal or we need to change your treatment, we will call you to review the results.  Follow-Up: At [facility], you and your health needs are our priority.  As part of our continuing mission to provide you with exceptional heart care, we have created designated Provider Care Teams.  These Care Teams include your primary Cardiologist (physician) and Advanced Practice Providers (APPs -  Physician Assistants and Nurse Practitioners) who all work together to provide you with the care you need, when you need it.  We recommend signing up for the patient portal called "MyChart".  Sign up information is provided on this After Visit Summary.  MyChart is used to connect with patients for Virtual Visits (Telemedicine).  Patients are able to view lab/test results, encounter notes, upcoming appointments, etc.  Non-urgent messages can be sent to your provider as well.   To learn more about what you can do with MyChart, go to [email or web address].    Your next appointment:   3 month(s)  Provider:   Dr Bjorn Pippin

## 2022-12-30 ENCOUNTER — Other Ambulatory Visit: Payer: Self-pay | Admitting: Cardiology

## 2022-12-30 ENCOUNTER — Other Ambulatory Visit: Payer: Medicare HMO

## 2022-12-30 DIAGNOSIS — N1831 Chronic kidney disease, stage 3a: Secondary | ICD-10-CM | POA: Diagnosis not present

## 2022-12-30 DIAGNOSIS — I5042 Chronic combined systolic (congestive) and diastolic (congestive) heart failure: Secondary | ICD-10-CM | POA: Diagnosis not present

## 2022-12-31 ENCOUNTER — Encounter: Payer: Self-pay | Admitting: Oncology

## 2022-12-31 LAB — MAGNESIUM: Magnesium: 1.9 mg/dL (ref 1.6–2.3)

## 2022-12-31 LAB — BASIC METABOLIC PANEL
BUN/Creatinine Ratio: 14 (ref 10–24)
BUN: 21 mg/dL (ref 8–27)
CO2: 23 mmol/L (ref 20–29)
Calcium: 9.8 mg/dL (ref 8.6–10.2)
Chloride: 100 mmol/L (ref 96–106)
Creatinine, Ser: 1.48 mg/dL — ABNORMAL HIGH (ref 0.76–1.27)
Glucose: 111 mg/dL — ABNORMAL HIGH (ref 70–99)
Potassium: 4.2 mmol/L (ref 3.5–5.2)
Sodium: 136 mmol/L (ref 134–144)
eGFR: 49 mL/min/{1.73_m2} — ABNORMAL LOW (ref >59)

## 2023-01-08 ENCOUNTER — Telehealth: Payer: Medicare HMO | Admitting: Family Medicine

## 2023-01-14 ENCOUNTER — Telehealth: Payer: Self-pay | Admitting: Neurology

## 2023-01-14 DIAGNOSIS — L923 Foreign body granuloma of the skin and subcutaneous tissue: Secondary | ICD-10-CM | POA: Diagnosis not present

## 2023-01-14 DIAGNOSIS — Z189 Retained foreign body fragments, unspecified material: Secondary | ICD-10-CM | POA: Diagnosis not present

## 2023-01-14 NOTE — Telephone Encounter (Signed)
Please advise patient to make an appointment with his DME provider for a mask refit to discuss mask leak issues and the possibility of changing to a different mask with a different headgear for better tolerance.

## 2023-01-14 NOTE — Telephone Encounter (Addendum)
Spoke to pt gave Dr Frances Furbish recommendation . Pt request to send recommendation through [facility]

## 2023-01-14 NOTE — Telephone Encounter (Signed)
Pt states he is having issues with his mask:it either leaks or causes burning on his face & the straps across neck are an issue.  Pt would like to discuss having the pressure reduced, please call

## 2023-01-14 NOTE — Telephone Encounter (Signed)
Pt states having issues with leaking from mask Pt states burnings at his face face an the straps across neck

## 2023-01-15 ENCOUNTER — Telehealth: Payer: Self-pay | Admitting: Medical Oncology

## 2023-01-15 NOTE — Telephone Encounter (Signed)
Pt left message that his facial metal was removed -He said he can now have the MRI.

## 2023-01-15 NOTE — Telephone Encounter (Signed)
I called Dr Damita Dunnings  office to get records of facial surgery yesterday. Spoke to [place] and requested to fax surgical report and note.

## 2023-01-22 ENCOUNTER — Ambulatory Visit (INDEPENDENT_AMBULATORY_CARE_PROVIDER_SITE_OTHER): Payer: Medicare HMO | Admitting: Family Medicine

## 2023-01-22 ENCOUNTER — Encounter: Payer: Self-pay | Admitting: Family Medicine

## 2023-01-22 VITALS — BP 113/64 | HR 66 | Temp 98.4°F | Ht 66.0 in | Wt 212.0 lb

## 2023-01-22 DIAGNOSIS — N1831 Chronic kidney disease, stage 3a: Secondary | ICD-10-CM | POA: Diagnosis not present

## 2023-01-22 DIAGNOSIS — G8929 Other chronic pain: Secondary | ICD-10-CM

## 2023-01-22 DIAGNOSIS — M545 Low back pain, unspecified: Secondary | ICD-10-CM

## 2023-01-22 DIAGNOSIS — I5020 Unspecified systolic (congestive) heart failure: Secondary | ICD-10-CM

## 2023-01-22 MED ORDER — ACETAMINOPHEN-CODEINE 300-30 MG PO TABS
1.0000 | ORAL_TABLET | Freq: Three times a day (TID) | ORAL | 0 refills | Status: DC | PRN
Start: 2023-01-22 — End: 2023-05-19

## 2023-01-22 NOTE — Patient Instructions (Signed)
Controlled Substance Guidelines:  1. You cannot get an early refill, even it is lost.  2. You cannot get controlled medications from any other doctor, unless it is the emergency department and related to a new problem or injury.  3. You cannot use alcohol, marijuana, cocaine or any other recreational drugs while using this medication. This is very dangerous.  4. You are willing to have your urine drug tested at each visit.  5. You will not drive while using this medication, because that can put yourself and others in serious danger of an accident. 6. If any medication is stolen, then there must be a police report to verify it, or it cannot be refilled.  7. I will not prescribe these medications for longer than 3 months.  8. You must bring your pill bottle to each visit.  9. You must use the same pharmacy for all refills for the medication, unless you clear it with me beforehand.  10. You cannot share or sell this medication.   

## 2023-01-22 NOTE — Progress Notes (Unsigned)
Subjective: CC:*** PCP: Raliegh Ip, DO [id number] Billy Rasmussen is a 77 y.o. male presenting to clinic today for:  1. ***   ROS: Per HPI  No Known Allergies Past Medical History:  Diagnosis Date   Arthritis    knees, shoulders, elbows   Bladder cancer [facility])    urologist-  dr Mena Goes   CHF (congestive heart failure) (HCC)    Diverticulosis of colon    Fibromyalgia    GERD (gastroesophageal reflux disease)    History of diverticulitis of colon    History of gastric ulcer    due to aleve   Hypertension    Lower urinary tract symptoms (LUTS)    OSA (obstructive sleep apnea)    NON-COMPLIANT  CPAP  --- BUT PT USES OXYGEN AT NIGHT 2.5L VIA [place] (PT'S DECISION)   PONV (postoperative nausea and vomiting)    Psoriasis    Sepsis (HCC)    january 2021   Tinnitus    right ear more, has tranmitter in right ear removable at hs   Wears glasses    Wears partial dentures     Current Outpatient Medications:    benzonatate (TESSALON) 100 MG capsule, Take 1 capsule (100 mg total) by mouth 3 (three) times daily as needed for cough., Disp: 20 capsule, Rfl: 0   furosemide (LASIX) 20 MG tablet, Take 1 tablet (20 mg) daily as needed if you gain more than 3 pounds in 1 day or 5 pounds in 1 week., Disp: 45 tablet, Rfl: 3   levothyroxine (SYNTHROID) 25 MCG tablet, Take 1 tablet (25 mcg total) by mouth daily before breakfast. Take on an empty stomach with water only.  Wait to consume other foods/ drinks/ meds for 30 minutes., Disp: 90 tablet, Rfl: 3   losartan (COZAAR) 25 MG tablet, Take 1 tablet (25 mg total) by mouth daily., Disp: 90 tablet, Rfl: 1   metoprolol succinate (TOPROL-XL) 25 MG 24 hr tablet, Take 1 tablet (25 mg total) by mouth daily., Disp: 90 tablet, Rfl: 1   polyvinyl alcohol (LIQUIFILM TEARS) 1.4 % ophthalmic solution, Place 1 drop into both eyes as needed for dry eyes., Disp: 15 mL, Rfl: 0   spironolactone (ALDACTONE) 25 MG tablet, Take 0.5 tablets (12.5 mg total) by mouth  daily., Disp: 90 tablet, Rfl: 3   atorvastatin (LIPITOR) 20 MG tablet, Take 1 tablet (20 mg total) by mouth daily., Disp: 90 tablet, Rfl: 3 Social History   Socioeconomic History   Marital status: Married    Spouse name: Peggy   Number of children: 2   Years of education: 14   Highest education level: Associate degree: occupational, Scientist, product/process development, or vocational program  Occupational History   Occupation: retired    Comment: Pharmacologist, 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  Retired, Visual merchandiser   Social Determinants of Corporate investment banker Strain: Medium Risk (12/11/2022)   Overall Financial Resource Strain (CARDIA)    Difficulty of Paying Living Expenses: Somewhat hard  Food Insecurity: No Food Insecurity (12/11/2022)   Hunger Vital Sign    Worried About Running Out of Food in the Last Year: Never true    Ran Out of Food in the Last Year: Never true  Transportation Needs: No Transportation Needs (12/11/2022)   PRAPARE - Administrator, Civil Service (Medical): No    Lack of Transportation (Non-Medical): No  Physical Activity: Inactive (12/11/2022)   Exercise Vital Sign    Days of Exercise per Week: 0 days    Minutes of Exercise per Session: 0 min  Stress: No Stress Concern Present (12/11/2022)   [employer] of Occupational Health - Occupational Stress Questionnaire    Feeling of Stress : Not at all  Social  Connections: Socially Integrated (12/11/2022)   Social Connection and Isolation Panel [NHANES]    Frequency of Communication with Friends and Family: Three times a week    Frequency of Social Gatherings with Friends and Family: Once a week    Attends Religious Services: 1 to 4 times per year    Active Member of [employer] or Organizations: Yes    Attends Banker Meetings: 1 to 4 times per year    Marital Status: Married  Catering manager Violence: Not At Risk (10/07/2022)   Humiliation, Afraid, Rape, and Kick questionnaire    Fear of Current or Ex-Partner: No    Emotionally Abused: No    Physically Abused: No    Sexually Abused: No   Family History  Problem Relation Age of Onset   Alzheimer's disease Mother    Heart attack Father    Heart disease Father    Irregular heart beat Brother        DEFIB.   Congestive Heart Failure Brother    Diabetes Daughter     Objective: Office vital signs reviewed. BP 113/64   Pulse 66   Temp 98.4 F (36.9 C)   Ht 5\' 6"  (1.676 m)   Wt 212 lb (96.2 kg)   SpO2 96%   BMI 34.22 kg/m   Physical Examination:  General: Awake, alert, *** nourished, No acute distress HEENT: Normal    Neck: No masses palpated. No lymphadenopathy    Ears: Tympanic membranes intact, normal light reflex, no erythema, no bulging    Eyes: PERRLA, extraocular membranes intact, sclera ***    Nose: nasal turbinates moist, *** nasal discharge    Throat: moist mucus membranes, no erythema, *** tonsillar exudate.  Airway is patent Cardio: regular rate and rhythm, S1S2 heard, no murmurs appreciated Pulm: clear to auscultation bilaterally, no wheezes, rhonchi or rales; normal work of breathing on room air GI: soft, non-tender, non-distended, bowel sounds present x4, no hepatomegaly, no splenomegaly, no masses GU: external vaginal tissue ***, cervix ***, *** punctate lesions on cervix appreciated, *** discharge from cervical os, *** bleeding, *** cervical motion  tenderness, *** abdominal/ adnexal masses Extremities: warm, well perfused, No edema, cyanosis or clubbing; +*** pulses bilaterally MSK: *** gait and *** station Skin: dry; intact; no rashes or lesions Neuro: *** Strength and light touch sensation grossly intact, *** DTRs ***/4  Assessment/ Plan: 77 y.o. male   No diagnosis found.  ***   Edda Orea Hulen Skains, DO [facility] [phone]

## 2023-01-25 ENCOUNTER — Ambulatory Visit (HOSPITAL_COMMUNITY): Payer: Medicare HMO | Attending: Cardiovascular Disease

## 2023-01-25 DIAGNOSIS — I5042 Chronic combined systolic (congestive) and diastolic (congestive) heart failure: Secondary | ICD-10-CM | POA: Diagnosis not present

## 2023-01-25 LAB — ECHOCARDIOGRAM COMPLETE
Area-P 1/2: 3.6 cm2
Est EF: <20
S' Lateral: 5.6 cm

## 2023-01-27 LAB — TOXASSURE SELECT 13 (MW), URINE

## 2023-02-08 ENCOUNTER — Ambulatory Visit: Payer: Medicare HMO | Admitting: Cardiovascular Disease

## 2023-02-08 ENCOUNTER — Telehealth: Payer: Self-pay

## 2023-02-08 NOTE — Telephone Encounter (Signed)
Spoke with patient, ICD implant scheduled for 03/01/23. Labs to be completed at office visit with Mealor on 02/16/23. No questions at this time

## 2023-02-09 ENCOUNTER — Other Ambulatory Visit: Payer: Self-pay | Admitting: Surgery

## 2023-02-09 DIAGNOSIS — I7121 Aneurysm of the ascending aorta, without rupture: Secondary | ICD-10-CM

## 2023-02-16 ENCOUNTER — Ambulatory Visit: Payer: Medicare HMO | Attending: Cardiovascular Disease | Admitting: Cardiovascular Disease

## 2023-02-16 ENCOUNTER — Other Ambulatory Visit: Payer: Self-pay | Admitting: Internal Medicine

## 2023-02-16 ENCOUNTER — Encounter: Payer: Self-pay | Admitting: Cardiovascular Disease

## 2023-02-16 ENCOUNTER — Encounter: Payer: Self-pay | Admitting: Internal Medicine

## 2023-02-16 ENCOUNTER — Telehealth: Payer: Self-pay | Admitting: Medical Oncology

## 2023-02-16 VITALS — BP 110/68 | HR 63 | Ht 63.0 in | Wt 213.6 lb

## 2023-02-16 DIAGNOSIS — I5042 Chronic combined systolic (congestive) and diastolic (congestive) heart failure: Secondary | ICD-10-CM | POA: Diagnosis not present

## 2023-02-16 DIAGNOSIS — K769 Liver disease, unspecified: Secondary | ICD-10-CM

## 2023-02-16 NOTE — H&P (View-Only) (Signed)
Electrophysiology Office Note:    Date:  02/16/2023   ID:  Billy Rasmussen, Billy Rasmussen 1946-01-26, MRN [id number]  PCP:  Raliegh Ip, DO   [facility] Providers Cardiologist:  Nicki Guadalajara, MD     Referring MD: Raliegh Ip, DO   History of Present Illness:    Billy Rasmussen is a 77 y.o. male with a medical history significant for chronic heart failure with reduced ejection fraction cancer status post left nephrectomy, cystoprostatectomy, obstructive sleep apnea not on CPAP, referred for possible CRT device.     He was diagnosed with heart failure in May 2024.  Ejection fraction was less than 20%.  Noted to have a left bundle branch block with a QRS greater than 160.  Coronary angiogram did not show significant coronary disease.  Is unable to have an MRI due to presence of metal in his eye.  He was discharged on metoprolol XL.  Losartan was added and follow-up on May 24.  Spironolactone was added in follow-up on May 30.   He is currently undergoing evaluation for thyroid cancer and myeloproliferative disorder.  He has a history of bladder cancer with cystectomy and an ileocecal conduit and colostomy.     He reports today that he is doing well and feels at baseline.  No acute complaints.  He does have shortness of breath and fatigue.  Since his last visit he had an echocardiogram that shows persistently depressed EF despite medical therapy.  EKGs/Labs/Other Studies Reviewed Today:    Echocardiogram:  TTE 09/2022 Ejection fraction less than 20%, mild LVH, slow flow in the LV apex -- potential thrombus in formation. Severely dilated left atrium. Degenerative MV with mild MR.  TTE 01/25/2023 Ejection fraction less than 20% with global hypokinesis.  Grade 2 diastolic dysfunction.  Markedly abnormal RV free wall strain.  Monitors:  Zio 10 day monitor 10/2022 -- my interpretation Sinus rhythm 47 217 bpm, average 74.  Less than 1% ectopy.  Few brief episodes of  SVT.  Stress testing:   Advanced imaging:    Cardiac catherization  10/09/2022 RHC/LHV Reviewed in epic  EKG:   EKG Interpretation Date/Time:  Tuesday February 16 2023 08:38:36 EDT Ventricular Rate:  63 PR Interval:  282 QRS Duration:  166 QT Interval:  468 QTC Calculation: 478 R Axis:   -57  Text Interpretation: Sinus rhythm with 1st degree A-V block Left axis deviation Left bundle branch block When compared with ECG of 08-Dec-2022 11:27, Questionable change in QRS axis Confirmed by York Pellant [phone]) on 02/16/2023 9:06:01 AM     Physical Exam:    VS:  BP 110/68   Pulse 63   Ht 5\' 3"  (1.6 m)   Wt 213 lb 9.6 oz (96.9 kg)   SpO2 95%   BMI 37.84 kg/m     Wt Readings from Last 3 Encounters:  02/16/23 213 lb 9.6 oz (96.9 kg)  01/22/23 212 lb (96.2 kg)  12/29/22 212 lb (96.2 kg)     GEN:  Well nourished, well developed in no acute distress CARDIAC: RRR, no murmurs, rubs, gallops RESPIRATORY:  Normal work of breathing MUSCULOSKELETAL: no edema    ASSESSMENT & PLAN:    Nonischemic cardiomyopathy, CHF reduced EF EF is less than 20% at the time of diagnosis in May He has been on GDMT since Oct 23, 2022 (losartan added to Metop XL) Spironolactone started 10/29/2022 No SG LT 2 inhibitor due to frequent UTIs EF has remained persistently low We will  schedule BiV ICD placement  I discussed the indication for the procedure and the logistics, risks, potential benefit, and after care. I specifically explained that risks include but are not limited to infection, bleeding,damage to blood vessels, lung, and the heart -- but risk of prolonged hospitalization, need for surgery, or the event of stroke, heart attack, or death are low but not zero.    Left bundle branch block QRS greater than 160 Possible LBBB - induced cardiomyopathy Will place CRT device  Risk of sudden cardiac death  He is currently being evaluated for thyroid cancer and has multiple  comorbidities We discussed the functions of and potential outcomes of the defibrillator. We will readdress in follow-up whether CRT-D or CRT-P fits within his goals of care if he qualifies for CRT After readdressing today he would like to proceed with defibrillator placement.    Signed, Maurice Small, MD  02/16/2023 9:06 AM    [facility]

## 2023-02-16 NOTE — Telephone Encounter (Signed)
MRI-Dtr requests to order MRI for his liver lesions. It needs to be scheduled before 09/30 when he is getting a pacemaker.   Hx Foreign body removed left lower eye.-ON 01/14/23 Dr Margo Aye removed it and the  path report noted " foreign material with fibrosis and hemosiderin deposition. Extensive deposition of hemosiderin due to old hemorrhage   The iron stain is positive ."     I asked if all the metal fragments were removed and  RN said 'as far as we know".

## 2023-02-16 NOTE — Progress Notes (Signed)
Electrophysiology Office Note:    Date:  02/16/2023   ID:  [name], [name] 1946-01-26, MRN [id number]  PCP:  Raliegh Ip, DO   [facility] Providers Cardiologist:  Nicki Guadalajara, MD     Referring MD: Raliegh Ip, DO   History of Present Illness:    Billy Rasmussen is a 77 y.o. male with a medical history significant for chronic heart failure with reduced ejection fraction cancer status post left nephrectomy, cystoprostatectomy, obstructive sleep apnea not on CPAP, referred for possible CRT device.     He was diagnosed with heart failure in May 2024.  Ejection fraction was less than 20%.  Noted to have a left bundle branch block with a QRS greater than 160.  Coronary angiogram did not show significant coronary disease.  Is unable to have an MRI due to presence of metal in his eye.  He was discharged on metoprolol XL.  Losartan was added and follow-up on May 24.  Spironolactone was added in follow-up on May 30.   He is currently undergoing evaluation for thyroid cancer and myeloproliferative disorder.  He has a history of bladder cancer with cystectomy and an ileocecal conduit and colostomy.     He reports today that he is doing well and feels at baseline.  No acute complaints.  He does have shortness of breath and fatigue.  Since his last visit he had an echocardiogram that shows persistently depressed EF despite medical therapy.  EKGs/Labs/Other Studies Reviewed Today:    Echocardiogram:  TTE 09/2022 Ejection fraction less than 20%, mild LVH, slow flow in the LV apex -- potential thrombus in formation. Severely dilated left atrium. Degenerative MV with mild MR.  TTE 01/25/2023 Ejection fraction less than 20% with global hypokinesis.  Grade 2 diastolic dysfunction.  Markedly abnormal RV free wall strain.  Monitors:  Zio 10 day monitor 10/2022 -- my interpretation Sinus rhythm 47 217 bpm, average 74.  Less than 1% ectopy.  Few brief episodes of  SVT.  Stress testing:   Advanced imaging:    Cardiac catherization  10/09/2022 RHC/LHV Reviewed in epic  EKG:   EKG Interpretation Date/Time:  Tuesday February 16 2023 08:38:36 EDT Ventricular Rate:  63 PR Interval:  282 QRS Duration:  166 QT Interval:  468 QTC Calculation: 478 R Axis:   -57  Text Interpretation: Sinus rhythm with 1st degree A-V block Left axis deviation Left bundle branch block When compared with ECG of 08-Dec-2022 11:27, Questionable change in QRS axis Confirmed by York Pellant [phone]) on 02/16/2023 9:06:01 AM     Physical Exam:    VS:  BP 110/68   Pulse 63   Ht 5\' 3"  (1.6 m)   Wt 213 lb 9.6 oz (96.9 kg)   SpO2 95%   BMI 37.84 kg/m     Wt Readings from Last 3 Encounters:  02/16/23 213 lb 9.6 oz (96.9 kg)  01/22/23 212 lb (96.2 kg)  12/29/22 212 lb (96.2 kg)     GEN:  Well nourished, well developed in no acute distress CARDIAC: RRR, no murmurs, rubs, gallops RESPIRATORY:  Normal work of breathing MUSCULOSKELETAL: no edema    ASSESSMENT & PLAN:    Nonischemic cardiomyopathy, CHF reduced EF EF is less than 20% at the time of diagnosis in May He has been on GDMT since Oct 23, 2022 (losartan added to Metop XL) Spironolactone started 10/29/2022 No SG LT 2 inhibitor due to frequent UTIs EF has remained persistently low We will  schedule BiV ICD placement  I discussed the indication for the procedure and the logistics, risks, potential benefit, and after care. I specifically explained that risks include but are not limited to infection, bleeding,damage to blood vessels, lung, and the heart -- but risk of prolonged hospitalization, need for surgery, or the event of stroke, heart attack, or death are low but not zero.    Left bundle branch block QRS greater than 160 Possible LBBB - induced cardiomyopathy Will place CRT device  Risk of sudden cardiac death  He is currently being evaluated for thyroid cancer and has multiple  comorbidities We discussed the functions of and potential outcomes of the defibrillator. We will readdress in follow-up whether CRT-D or CRT-P fits within his goals of care if he qualifies for CRT After readdressing today he would like to proceed with defibrillator placement.    Signed, Maurice Small, MD  02/16/2023 9:06 AM    [facility]

## 2023-02-16 NOTE — Patient Instructions (Addendum)
Medication Instructions:  Your physician recommends that you continue on your current medications as directed. Please refer to the Current Medication list given to you today. *If you need a refill on your cardiac medications before your next appointment, please call your pharmacy*   Lab Work: CBC and BMET TODAY If you have labs (blood work) drawn today and your tests are completely normal, you will receive your results only by: MyChart Message (if you have MyChart) OR A paper copy in the mail If you have any lab test that is abnormal or we need to change your treatment, we will call you to review the results.   Testing/Procedures: ICD implant on 03/01/23 with Dr Nelly Laurence - see instruction letter  Call our office if cardiac MRI isn't scheduled prior to 03/01/23 implant date, we will need to ensure that this is complete prior to implant   Follow-Up: At [facility], you and your health needs are our priority.  As part of our continuing mission to provide you with exceptional heart care, we have created designated Provider Care Teams.  These Care Teams include your primary Cardiologist (physician) and Advanced Practice Providers (APPs -  Physician Assistants and Nurse Practitioners) who all work together to provide you with the care you need, when you need it.  We recommend signing up for the patient portal called "MyChart".  Sign up information is provided on this After Visit Summary.  MyChart is used to connect with patients for Virtual Visits (Telemedicine).  Patients are able to view lab/test results, encounter notes, upcoming appointments, etc.  Non-urgent messages can be sent to your provider as well.   To learn more about what you can do with MyChart, go to [email or web address].    Your next appointment:   We will schedule follow up after ICD implant   Provider:   York Pellant, MD

## 2023-02-17 ENCOUNTER — Telehealth: Payer: Self-pay | Admitting: Medical Oncology

## 2023-02-17 ENCOUNTER — Encounter: Payer: Self-pay | Admitting: Oncology

## 2023-02-17 NOTE — Telephone Encounter (Signed)
MRi tomorrow.

## 2023-02-18 ENCOUNTER — Ambulatory Visit (HOSPITAL_COMMUNITY)
Admission: RE | Admit: 2023-02-18 | Discharge: 2023-02-18 | Disposition: A | Discharge status: Home / Self Care | Payer: Medicare HMO | Source: Ambulatory Visit | Attending: Internal Medicine | Admitting: Internal Medicine

## 2023-02-18 ENCOUNTER — Encounter: Payer: Self-pay | Admitting: Adult Health

## 2023-02-18 ENCOUNTER — Other Ambulatory Visit: Payer: Self-pay | Admitting: Internal Medicine

## 2023-02-18 ENCOUNTER — Telehealth: Payer: Self-pay | Admitting: Physician Assistant

## 2023-02-18 ENCOUNTER — Ambulatory Visit: Payer: Medicare HMO | Admitting: Adult Health

## 2023-02-18 VITALS — BP 110/63 | HR 62 | Ht 66.0 in | Wt 211.8 lb

## 2023-02-18 DIAGNOSIS — G4733 Obstructive sleep apnea (adult) (pediatric): Secondary | ICD-10-CM | POA: Diagnosis not present

## 2023-02-18 DIAGNOSIS — K7689 Other specified diseases of liver: Secondary | ICD-10-CM | POA: Diagnosis not present

## 2023-02-18 DIAGNOSIS — K769 Liver disease, unspecified: Secondary | ICD-10-CM

## 2023-02-18 DIAGNOSIS — K8020 Calculus of gallbladder without cholecystitis without obstruction: Secondary | ICD-10-CM | POA: Diagnosis not present

## 2023-02-18 DIAGNOSIS — Z1389 Encounter for screening for other disorder: Secondary | ICD-10-CM | POA: Diagnosis not present

## 2023-02-18 DIAGNOSIS — C679 Malignant neoplasm of bladder, unspecified: Secondary | ICD-10-CM | POA: Diagnosis not present

## 2023-02-18 DIAGNOSIS — C649 Malignant neoplasm of unspecified kidney, except renal pelvis: Secondary | ICD-10-CM | POA: Diagnosis not present

## 2023-02-18 MED ORDER — GADOBUTROL 1 MMOL/ML IV SOLN
10.0000 mL | Freq: Once | INTRAVENOUS | Status: AC | PRN
  Administered 2023-02-18: 10 mL via INTRAVENOUS

## 2023-02-18 NOTE — Patient Instructions (Signed)
Your Plan:  Continue using CPAP nightly  Order sent for mask refitting If your symptoms worsen or you develop new symptoms please let us know.  Sleep open the care and I said they found the show there which is still comfortable for you to write and people in manage Final sure what you stop and get up if I was there so this  Thank you for coming to see [place] at [facility]. I hope we have been able to provide you high quality care today.  You may receive a patient satisfaction survey over the next few weeks. We would appreciate your feedback and comments so that we may continue to improve ourselves and the health of our patients.

## 2023-02-19 NOTE — Progress Notes (Unsigned)
[facility] OFFICE PROGRESS NOTE  Raliegh Ip, DO [address] [place] [address]  DIAGNOSIS: *** 1) Splenomegaly of unclear etiology but there was suspicious liver lesion on the ultrasound of the abdomen. 2) history of bladder cancer status post TURBT under the care of Dr. Mena Goes diagnosed in July 2015 and followed by urology.  PRIOR THERAPY: None  CURRENT THERAPY: None  INTERVAL HISTORY: Billy Rasmussen 77 y.o. male returns to clinic today for follow-up visit accompanied by ***.  The patient was last seen by Dr. Arbutus Ped on 12/10/2022.  He had an ultrasound of the abdomen performed in ***that showed masslike hyperechoic area in the right hepatic lobe measuring 1.6 cm and MRI was recommended. However, the patient has foreign object metal material in his eye and he is unable to undergo MRI.  The patient does have a history of alcohol abuse and there is concern for hepatocellular carcinoma could not be ruled out.  He eventually saw Dr. Marland KitchenWho surgically remove this.  In the interval, he is seen cardiology because he has EF of less than 20% on 10/07/2022.  He was also found to have significant sleep apnea.  He is planning on having ICD placement on ***  When the patient was seen by Dr. Arbutus Ped, he arranged for flow cytometry.  He had hip hepatitis testing which was nonreactive.  His BCR-ABL and JAK2 testing was normal.  His last being seen, the patient denies any major changes in his health.  He denies any fever, chills, night sweats, or unexplained weight loss?  Appetite?  Denies any odynophagia or dysphagia.  Denies any chest pain, shortness of breath, cough, or hemoptysis.  Denies any nausea, vomiting, diarrhea, or constipation.  Epigastric pain?  Back pain?  Jaundice and itching?  Blood in the stool?  He is here today for evaluation to review his MRI results and discuss the next steps in his care.   MEDICAL HISTORY: Past Medical History:  Diagnosis Date    Arthritis    knees, shoulders, elbows   Bladder cancer [facility])    urologist-  dr Mena Goes   CHF (congestive heart failure) (HCC)    Diverticulosis of colon    Fibromyalgia    GERD (gastroesophageal reflux disease)    History of diverticulitis of colon    History of gastric ulcer    due to aleve   Hypertension    Lower urinary tract symptoms (LUTS)    OSA (obstructive sleep apnea)    NON-COMPLIANT  CPAP  --- BUT PT USES OXYGEN AT NIGHT 2.5L VIA [place] (PT'S DECISION)   PONV (postoperative nausea and vomiting)    Psoriasis    Sepsis (HCC)    january 2021   Tinnitus    right ear more, has tranmitter in right ear removable at hs   Wears glasses    Wears partial dentures     ALLERGIES:  has No Known Allergies.  MEDICATIONS:  Current Outpatient Medications  Medication Sig Dispense Refill   acetaminophen-codeine (TYLENOL #3) 300-30 MG tablet Take 1 tablet by mouth every 8 (eight) hours as needed for moderate pain. 30 tablet 0   atorvastatin (LIPITOR) 20 MG tablet Take 1 tablet (20 mg total) by mouth daily. 90 tablet 3   benzonatate (TESSALON) 100 MG capsule Take 1 capsule (100 mg total) by mouth 3 (three) times daily as needed for cough. 20 capsule 0   docusate sodium (COLACE) 100 MG capsule Take 100 mg by mouth daily as  needed for mild constipation.     fluticasone (FLONASE) 50 MCG/ACT nasal spray Place 1 spray into both nostrils daily as needed for rhinitis.     furosemide (LASIX) 20 MG tablet Take 1 tablet (20 mg) daily as needed if you gain more than 3 pounds in 1 day or 5 pounds in 1 week. 45 tablet 3   levothyroxine (SYNTHROID) 25 MCG tablet Take 1 tablet (25 mcg total) by mouth daily before breakfast. Take on an empty stomach with water only.  Wait to consume other foods/ drinks/ meds for 30 minutes. 90 tablet 3   losartan (COZAAR) 25 MG tablet Take 1 tablet (25 mg total) by mouth daily. 90 tablet 1   metoprolol succinate (TOPROL-XL) 25 MG 24 hr tablet Take 1 tablet (25 mg total) by  mouth daily. 90 tablet 1   Multiple Vitamin (MULTIVITAMIN) capsule Take 1 capsule by mouth daily.     polyvinyl alcohol (LIQUIFILM TEARS) 1.4 % ophthalmic solution Place 1 drop into both eyes as needed for dry eyes. 15 mL 0   spironolactone (ALDACTONE) 25 MG tablet Take 0.5 tablets (12.5 mg total) by mouth daily. 90 tablet 3   No current facility-administered medications for this visit.    SURGICAL HISTORY:  Past Surgical History:  Procedure Laterality Date   APPENDECTOMY     COLECTOMY W/ COLOSTOMY  1996   W/   APPENDECTOMY   COLONOSCOPY N/A 05/11/2018   Procedure: COLONOSCOPY;  Surgeon: Corbin Ade, MD;  [facility];  Service: Endoscopy;  Laterality: N/A;  9:30   COLOSTOMY TAKEDOWN  1996   CYSTOSCOPY WITH BIOPSY N/A 12/05/2013   Procedure: CYSTO BLADDER BIOPSY AND FULGERATION;  Surgeon: Jerilee Field, MD;  Location: [facility];  Service: Urology;  Laterality: N/A;   CYSTOSCOPY WITH BIOPSY Bilateral 11/13/2014   Procedure: CYSTOSCOPY WITH  BLADDER BIOPSY FULGERATION AND BILATERAL RETROGRADE PYELOGRAMS;  Surgeon: Jerilee Field, MD;  Location: [facility];  Service: Urology;  Laterality: Bilateral;   CYSTOSCOPY WITH FULGERATION N/A 01/18/2018   Procedure: CYSTOSCOPY WITH FULGERATION/ BLADDER BIOPSY;  Surgeon: Jerilee Field, MD;  Location: [facility];  Service: Urology;  Laterality: N/A;   CYSTOSCOPY WITH INJECTION N/A 11/09/2018   Procedure: CYSTOSCOPY WITH INJECTION OF INDOCYANINE GREEN DYE;  Surgeon: Sebastian Ache, MD;  Location: [facility];  Service: Urology;  Laterality: N/A;   CYSTOSCOPY WITH INSERTION OF UROLIFT N/A 01/18/2018   Procedure: CYSTOSCOPY WITH INSERTION OF UROLIFT;  Surgeon: Jerilee Field, MD;  Location: [facility];  Service: Urology;  Laterality: N/A;   CYSTOSCOPY/URETEROSCOPY/HOLMIUM LASER Left 02/17/2019   Procedure: URETEROSCOPY WITH BALLOON DILATION  AND NEPHROSTOGRAM;  Surgeon: Sebastian Ache, MD;  Location[facility];  Service: Urology;  Laterality: Left;  1 HR   EXCISION RIGHT UPPER ARM LIPOMA  2005   HEMORROIDECTOMY     INGUINAL HERNIA REPAIR Left 1984   IR NEPHROSTOMY PLACEMENT LEFT  01/05/2019   KNEE ARTHROSCOPY Left X3  LAST ONE  2002   ORIF LEFT HUMEROUS FX  1976   POLYPECTOMY  05/11/2018   Procedure: POLYPECTOMY;  Surgeon: Corbin Ade, MD;  [facility];  Service: Endoscopy;;   PROSTATE SURGERY     RIGHT HEART CATH AND CORONARY ANGIOGRAPHY N/A 10/09/2022   Procedure: RIGHT HEART CATH AND CORONARY ANGIOGRAPHY;  Surgeon: [name], MD;  [facility];  Service: Cardiovascular;  Laterality: N/A;   ROBOT ASSISTED LAPAROSCOPIC NEPHRECTOMY Left 07/14/2019  Procedure: XI ROBOTIC ASSISTED LAPAROSCOPIC RETROPERITONEAL NEPHRECTOMY;  Surgeon: Sebastian Ache, MD;  Location: [facility];  Service: Urology;  Laterality: Left;  3 HRS   TONSILLECTOMY  AS CHILD   TOTAL KNEE ARTHROPLASTY Left 2006   REVISION 2007  (AFTER I & D WITH ANTIBIOTIC SPACER PROCEDURE FOR STEPH INFECTION)   TOTAL KNEE ARTHROPLASTY Right 03/30/2016   Procedure: RIGHT TOTAL KNEE ARTHROPLASTY;  Surgeon: Durene Romans, MD;  Location: [facility];  Service: Orthopedics;  Laterality: Right;   TOTAL SHOULDER ARTHROPLASTY Right 04/06/2013   Procedure: RIGHT TOTAL SHOULDER ARTHROPLASTY;  Surgeon: Senaida Lange, MD;  Location: [facility];  Service: Orthopedics;  Laterality: Right;   TOTAL SHOULDER ARTHROPLASTY Left 07/20/2013   Procedure: LEFT TOTAL SHOULDER ARTHROPLASTY;  Surgeon: Senaida Lange, MD;  Location: [facility];  Service: Orthopedics;  Laterality: Left;   TRANSURETHRAL RESECTION OF BLADDER TUMOR N/A 11/12/2015   Procedure: TRANSURETHRAL RESECTION OF BLADDER TUMOR (TURBT);  Surgeon: Jerilee Field, MD;  Location: [facility];  Service: Urology;  Laterality: N/A;   TRANSURETHRAL RESECTION OF BLADDER TUMOR WITH GYRUS (TURBT-GYRUS) N/A 12/05/2013   Procedure:  TRANSURETHRAL RESECTION OF BLADDER TUMOR WITH GYRUS (TURBT-GYRUS);  Surgeon: Jerilee Field, MD;  Location: [facility];  Service: Urology;  Laterality: N/A;    REVIEW OF SYSTEMS:   Review of Systems  Constitutional: Negative for appetite change, chills, fatigue, fever and unexpected weight change.  HENT:   Negative for mouth sores, nosebleeds, sore throat and trouble swallowing.   Eyes: Negative for eye problems and icterus.  Respiratory: Negative for cough, hemoptysis, shortness of breath and wheezing.   Cardiovascular: Negative for chest pain and leg swelling.  Gastrointestinal: Negative for abdominal pain, constipation, diarrhea, nausea and vomiting.  Genitourinary: Negative for bladder incontinence, difficulty urinating, dysuria, frequency and hematuria.   Musculoskeletal: Negative for back pain, gait problem, neck pain and neck stiffness.  Skin: Negative for itching and rash.  Neurological: Negative for dizziness, extremity weakness, gait problem, headaches, light-headedness and seizures.  Hematological: Negative for adenopathy. Does not bruise/bleed easily.  Psychiatric/Behavioral: Negative for confusion, depression and sleep disturbance. The patient is not nervous/anxious.     PHYSICAL EXAMINATION:  There were no vitals taken for this visit.  ECOG PERFORMANCE STATUS: {CHL ONC ECOG [id number]  Physical Exam  Constitutional: Oriented to person, place, and time and well-developed, well-nourished, and in no distress. No distress.  HENT:  Head: Normocephalic and atraumatic.  Mouth/Throat: Oropharynx is clear and moist. No oropharyngeal exudate.  Eyes: Conjunctivae are normal. Right eye exhibits no discharge. Left eye exhibits no discharge. No scleral icterus.  Neck: Normal range of motion. Neck supple.  Cardiovascular: Normal rate, regular rhythm, normal heart sounds and intact distal pulses.   Pulmonary/Chest: Effort normal and breath sounds normal. No  respiratory distress. No wheezes. No rales.  Abdominal: Soft. Bowel sounds are normal. Exhibits no distension and no mass. There is no tenderness.  Musculoskeletal: Normal range of motion. Exhibits no edema.  Lymphadenopathy:    No cervical adenopathy.  Neurological: Alert and oriented to person, place, and time. Exhibits normal muscle tone. Gait normal. Coordination normal.  Skin: Skin is warm and dry. No rash noted. Not diaphoretic. No erythema. No pallor.  Psychiatric: Mood, memory and judgment normal.  Vitals reviewed.  LABORATORY DATA: Lab Results  Component Value Date   WBC 8.4 02/16/2023   HGB 14.9 02/16/2023   HCT 43.8 02/16/2023   MCV 91 02/16/2023   PLT 122 (L) 02/16/2023  Chemistry      Component Value Date/Time   NA 137 02/16/2023 0915   K 4.9 02/16/2023 0915   CL 97 02/16/2023 0915   CO2 25 02/16/2023 0915   BUN 27 02/16/2023 0915   CREATININE 1.57 (H) 02/16/2023 0915   CREATININE 1.47 (H) 11/11/2022 1053      Component Value Date/Time   CALCIUM 10.3 (H) 02/16/2023 0915   ALKPHOS 61 11/11/2022 1053   AST 43 (H) 11/11/2022 1053   ALT 25 11/11/2022 1053   BILITOT 0.8 11/11/2022 1053       RADIOGRAPHIC STUDIES:  DG Eye Foreign Body  Result Date: 02/18/2023 CLINICAL DATA:  Liver lesion.  Metal removed from orbit. EXAM: ORBITS FOR FOREIGN BODY - 2 VIEW COMPARISON:  None Available. FINDINGS: There is no evidence of metallic foreign body within the orbits. No significant bone abnormality identified. IMPRESSION: No evidence of metallic foreign body within the orbits. Electronically Signed   By: Marin Roberts M.D.   On: 02/18/2023 13:24   ECHOCARDIOGRAM COMPLETE  Result Date: 01/25/2023    ECHOCARDIOGRAM REPORT   Patient Name:   Billy Rasmussen Date of Exam: 01/25/2023 Medical Rec #:  [id number]        Height:       66.0 in Accession #:    [id number]       Weight:       212.0 lb Date of Birth:  April 04, 1946         BSA:          2.050 m Patient Age:    77  years         BP:           127/75 mmHg Patient Gender: M                HR:           62 bpm. Exam Location:  [address] Procedure: 2D Echo, 3D Echo, Cardiac Doppler, Color Doppler and Strain Analysis Indications:    I50.42 CHF  History:        Patient has prior history of Echocardiogram examinations, most                 recent 10/07/2022. CHF, CAD; Risk Factors:Sleep Apnea and                 Hypertension. Previous echo revealed LVEF <20% with global                 hypokinesis with septal dyssynergy.  Sonographer:    Chanetta Marshall BA, RDCS Referring Phys: Maurice Small IMPRESSIONS  1. Left ventricular ejection fraction, by estimation, is <20%. The left ventricle has severely decreased function. The left ventricle demonstrates global hypokinesis. The left ventricular internal cavity size was mildly dilated. Left ventricular diastolic parameters are consistent with Grade II diastolic dysfunction (pseudonormalization). Elevated left ventricular end-diastolic pressure. The E/e' is 20. The average left ventricular global longitudinal strain is -9.6 %. The global longitudinal strain is abnormal.  2. Markedly abnormal RV free wall strain at -7.7%. Right ventricular systolic function is mildly reduced. The right ventricular size is mildly enlarged. There is normal pulmonary artery systolic pressure. The estimated right ventricular systolic pressure is 25.3 mmHg.  3. The mitral valve is abnormal. Mild to moderate mitral valve regurgitation.  4. The aortic valve is tricuspid. Aortic valve regurgitation is not visualized.  5. Aortic dilatation noted. There is mild dilatation of the ascending aorta, measuring 44 mm.  There is borderline dilatation of the aortic root, measuring 39 mm.  6. The inferior vena cava is normal in size with greater than 50% respiratory variability, suggesting right atrial pressure of 3 mmHg. Comparison(s): No significant change from prior study. 10/07/2022: LVEF <20%, global HK/septal  dyssynchrony. FINDINGS  Left Ventricle: Left ventricular ejection fraction, by estimation, is <20%. The left ventricle has severely decreased function. The left ventricle demonstrates global hypokinesis. The average left ventricular global longitudinal strain is -9.6 %. The global longitudinal strain is abnormal. The left ventricular internal cavity size was mildly dilated. There is no left ventricular hypertrophy. Left ventricular diastolic parameters are consistent with Grade II diastolic dysfunction (pseudonormalization). Elevated left ventricular end-diastolic pressure. The E/e' is 20. Right Ventricle: Markedly abnormal RV free wall strain at -7.7%. The right ventricular size is mildly enlarged. No increase in right ventricular wall thickness. Right ventricular systolic function is mildly reduced. There is normal pulmonary artery systolic pressure. The tricuspid regurgitant velocity is 2.36 m/s, and with an assumed right atrial pressure of 3 mmHg, the estimated right ventricular systolic pressure is 25.3 mmHg. Left Atrium: Left atrial size was normal in size. Right Atrium: Right atrial size was normal in size. Pericardium: There is no evidence of pericardial effusion. Mitral Valve: The mitral valve is abnormal. There is mild calcification of the anterior and posterior mitral valve leaflet(s). Mild to moderate mitral valve regurgitation, with centrally-directed jet. Tricuspid Valve: The tricuspid valve is grossly normal. Tricuspid valve regurgitation is trivial. Aortic Valve: The aortic valve is tricuspid. Aortic valve regurgitation is not visualized. Pulmonic Valve: The pulmonic valve was normal in structure. Pulmonic valve regurgitation is not visualized. Aorta: Aortic dilatation noted. There is mild dilatation of the ascending aorta, measuring 44 mm. There is borderline dilatation of the aortic root, measuring 39 mm. Venous: The inferior vena cava is normal in size with greater than 50% respiratory  variability, suggesting right atrial pressure of 3 mmHg. IAS/Shunts: No atrial level shunt detected by color flow Doppler.  LEFT VENTRICLE PLAX 2D LVIDd:         6.20 cm   Diastology LVIDs:         5.60 cm   LV e' medial:    4.89 cm/s LV PW:         1.00 cm   LV E/e' medial:  20.8 LV IVS:        0.90 cm   LV e' lateral:   5.44 cm/s LVOT diam:     2.60 cm   LV E/e' lateral: 18.8 LV SV:         82 LV SV Index:   40        2D Longitudinal Strain LVOT Area:     5.31 cm  2D Strain GLS (A2C):   -10.1 %                          2D Strain GLS (A3C):   -9.7 %                          2D Strain GLS (A4C):   -9.1 %                          2D Strain GLS Avg:     -9.6 %  3D Volume EF:                          3D EF:        35 %                          LV EDV:       242 ml                          LV ESV:       157 ml                          LV SV:        85 ml RIGHT VENTRICLE RV Basal diam:  4.30 cm RV Mid diam:    3.50 cm RV S prime:     9.54 cm/s TAPSE (M-mode): 2.0 cm RVSP:           25.3 mmHg LEFT ATRIUM             Index        RIGHT ATRIUM           Index LA diam:        5.20 cm 2.54 cm/m   RA Pressure: 3.00 mmHg LA Vol (A2C):   47.7 ml 23.27 ml/m  RA Area:     14.10 cm LA Vol (A4C):   44.8 ml 21.85 ml/m  RA Volume:   33.30 ml  16.24 ml/m LA Biplane Vol: 47.9 ml 23.37 ml/m  AORTIC VALVE LVOT Vmax:   69.67 cm/s LVOT Vmean:  44.367 cm/s LVOT VTI:    0.154 m  AORTA Ao Root diam: 3.90 cm Ao Asc diam:  4.45 cm MITRAL VALVE                TRICUSPID VALVE MV Area (PHT): 3.60 cm     TR Peak grad:   22.3 mmHg MV Decel Time: 211 msec     TR Vmax:        236.00 cm/s MV E velocity: 102.00 cm/s  Estimated RAP:  3.00 mmHg MV A velocity: 92.80 cm/s   RVSP:           25.3 mmHg MV E/A ratio:  1.10                             SHUNTS                             Systemic VTI:  0.15 m                             Systemic Diam: 2.60 cm Zoila Shutter MD Electronically signed by Zoila Shutter MD Signature  Date/Time: 01/25/2023/1:47:32 PM    Final      ASSESSMENT/PLAN:  This is a very pleasant 77 year old Caucasian male who was initially referred to the clinic for splenomegaly.  He was also found to have thrombocytopenia.  He had an ultrasound of the abdomen that showed normal spleen but suspicious hypoechoic lesion in the right lower the liver.  He has history of alcohol abuse in the past and hepatocellular carcinoma cannot be ruled out.  He was unable to have an  MRI due to having a foreign metal body near his eye.  He did have this recently surgically removed.  Therefore he was able to get his MRI as recommended.  The patient was seen with Dr. Arbutus Ped today.  Dr. Arbutus Ped personally independently reviewed the scan and discussed the results with the patient today.  The scan showed ***  Dr. Arbutus Ped recommends ***   I will arrange for ***  F/U?   Getting pacemaker on ***   No orders of the defined types were placed in this encounter.    I spent {CHL ONC TIME VISIT - ZOXWR:[id number]} counseling the patient face to face. The total time spent in the appointment was {CHL ONC TIME VISIT - BJYNW:[id number]}.  Laretha Luepke L Chin Wachter, PA-C 02/19/23

## 2023-02-22 ENCOUNTER — Encounter: Payer: Self-pay | Admitting: Internal Medicine

## 2023-02-24 ENCOUNTER — Other Ambulatory Visit: Payer: Self-pay

## 2023-02-24 ENCOUNTER — Inpatient Hospital Stay: Payer: Medicare HMO | Attending: Internal Medicine | Admitting: Physician Assistant

## 2023-02-24 ENCOUNTER — Ambulatory Visit (HOSPITAL_COMMUNITY): Payer: Medicare HMO

## 2023-02-24 VITALS — BP 129/87 | HR 66 | Temp 97.6°F | Resp 16 | Wt 211.6 lb

## 2023-02-24 DIAGNOSIS — Z8551 Personal history of malignant neoplasm of bladder: Secondary | ICD-10-CM | POA: Insufficient documentation

## 2023-02-24 DIAGNOSIS — C679 Malignant neoplasm of bladder, unspecified: Secondary | ICD-10-CM

## 2023-02-24 DIAGNOSIS — R932 Abnormal findings on diagnostic imaging of liver and biliary tract: Secondary | ICD-10-CM | POA: Diagnosis not present

## 2023-02-24 DIAGNOSIS — R161 Splenomegaly, not elsewhere classified: Secondary | ICD-10-CM

## 2023-02-26 NOTE — Pre-Procedure Instructions (Signed)
Instructed patient on the following items: Arrival time 0930 Nothing to eat or drink after midnight No meds AM of procedure Responsible person to drive you home and stay with you for 24 hrs Wash with special soap night before and morning of procedure  

## 2023-03-01 ENCOUNTER — Ambulatory Visit (HOSPITAL_COMMUNITY): Payer: Medicare HMO

## 2023-03-01 ENCOUNTER — Other Ambulatory Visit: Payer: Self-pay

## 2023-03-01 ENCOUNTER — Encounter (HOSPITAL_COMMUNITY)
Admission: RE | Disposition: A | Discharge status: Home / Self Care | Payer: Medicare HMO | Source: Home / Self Care | Attending: Cardiovascular Disease

## 2023-03-01 ENCOUNTER — Ambulatory Visit (HOSPITAL_COMMUNITY)
Admission: RE | Admit: 2023-03-01 | Discharge: 2023-03-01 | Disposition: A | Discharge status: Home / Self Care | Payer: Medicare HMO | Attending: Cardiovascular Disease | Admitting: Cardiovascular Disease

## 2023-03-01 DIAGNOSIS — I447 Left bundle-branch block, unspecified: Secondary | ICD-10-CM

## 2023-03-01 DIAGNOSIS — G4733 Obstructive sleep apnea (adult) (pediatric): Secondary | ICD-10-CM | POA: Diagnosis not present

## 2023-03-01 DIAGNOSIS — I428 Other cardiomyopathies: Secondary | ICD-10-CM | POA: Diagnosis not present

## 2023-03-01 DIAGNOSIS — I5022 Chronic systolic (congestive) heart failure: Secondary | ICD-10-CM

## 2023-03-01 DIAGNOSIS — Z79899 Other long term (current) drug therapy: Secondary | ICD-10-CM | POA: Insufficient documentation

## 2023-03-01 DIAGNOSIS — C73 Malignant neoplasm of thyroid gland: Secondary | ICD-10-CM | POA: Diagnosis not present

## 2023-03-01 DIAGNOSIS — D471 Chronic myeloproliferative disease: Secondary | ICD-10-CM | POA: Insufficient documentation

## 2023-03-01 DIAGNOSIS — Z96611 Presence of right artificial shoulder joint: Secondary | ICD-10-CM | POA: Diagnosis not present

## 2023-03-01 DIAGNOSIS — Z96612 Presence of left artificial shoulder joint: Secondary | ICD-10-CM | POA: Diagnosis not present

## 2023-03-01 DIAGNOSIS — Z905 Acquired absence of kidney: Secondary | ICD-10-CM | POA: Insufficient documentation

## 2023-03-01 DIAGNOSIS — I517 Cardiomegaly: Secondary | ICD-10-CM | POA: Diagnosis not present

## 2023-03-01 HISTORY — PX: BIV ICD INSERTION CRT-D: EP1195

## 2023-03-01 SURGERY — BIV ICD INSERTION CRT-D

## 2023-03-01 MED ORDER — LIDOCAINE HCL (PF) 1 % IJ SOLN
INTRAMUSCULAR | Status: AC
  Filled 2023-03-01: qty 60

## 2023-03-01 MED ORDER — SODIUM CHLORIDE 0.9 % IV SOLN: INTRAVENOUS | Status: DC

## 2023-03-01 MED ORDER — SODIUM CHLORIDE 0.9 % IV SOLN
INTRAVENOUS | Status: AC
  Administered 2023-03-01: 80 mg
  Filled 2023-03-01: qty 2

## 2023-03-01 MED ORDER — HEPARIN (PORCINE) IN NACL 1000-0.9 UT/500ML-% IV SOLN
INTRAVENOUS | Status: DC | PRN
  Administered 2023-03-01: 500 mL

## 2023-03-01 MED ORDER — ONDANSETRON HCL 4 MG/2ML IJ SOLN: 4.0000 mg | Freq: Four times a day (QID) | INTRAMUSCULAR | Status: DC | PRN

## 2023-03-01 MED ORDER — LIDOCAINE HCL (PF) 1 % IJ SOLN
INTRAMUSCULAR | Status: DC | PRN
  Administered 2023-03-01: 55 mL

## 2023-03-01 MED ORDER — MIDAZOLAM HCL 2 MG/2ML IJ SOLN
INTRAMUSCULAR | Status: AC
  Filled 2023-03-01: qty 2

## 2023-03-01 MED ORDER — FENTANYL CITRATE (PF) 100 MCG/2ML IJ SOLN
INTRAMUSCULAR | Status: AC
  Filled 2023-03-01: qty 2

## 2023-03-01 MED ORDER — MIDAZOLAM HCL 5 MG/5ML IJ SOLN
INTRAMUSCULAR | Status: DC | PRN
  Administered 2023-03-01 (×3): 1 mg via INTRAVENOUS

## 2023-03-01 MED ORDER — POVIDONE-IODINE 10 % EX SWAB
2.0000 | Freq: Once | CUTANEOUS | Status: AC
  Administered 2023-03-01: 2 via TOPICAL

## 2023-03-01 MED ORDER — ACETAMINOPHEN 325 MG PO TABS
325.0000 mg | ORAL_TABLET | ORAL | Status: DC | PRN
  Administered 2023-03-01: 650 mg via ORAL
  Filled 2023-03-01: qty 2

## 2023-03-01 MED ORDER — CEFAZOLIN SODIUM-DEXTROSE 2-4 GM/100ML-% IV SOLN
INTRAVENOUS | Status: AC
  Administered 2023-03-01: 2 g via INTRAVENOUS
  Filled 2023-03-01: qty 100

## 2023-03-01 MED ORDER — IODIXANOL 320 MG/ML IV SOLN
INTRAVENOUS | Status: DC | PRN
  Administered 2023-03-01: 25 mL

## 2023-03-01 MED ORDER — SODIUM CHLORIDE 0.9 % IV SOLN: 80.0000 mg | INTRAVENOUS | Status: AC

## 2023-03-01 MED ORDER — FENTANYL CITRATE (PF) 100 MCG/2ML IJ SOLN
INTRAMUSCULAR | Status: DC | PRN
  Administered 2023-03-01 (×3): 25 ug via INTRAVENOUS

## 2023-03-01 MED ORDER — CEFAZOLIN SODIUM-DEXTROSE 2-4 GM/100ML-% IV SOLN: 2.0000 g | INTRAVENOUS | Status: AC

## 2023-03-01 SURGICAL SUPPLY — 17 items
BALLN COR SINUS VENO 6FR 80 (BALLOONS) ×1
BALLOON COR SINUS VENO 6FR 80 (BALLOONS) IMPLANT
CABLE SURGICAL S-101-97-12 (CABLE) ×1 IMPLANT
CATH CPS DIRECT 135 DS2C020 (CATHETERS) IMPLANT
ICD GALLANT HFCRTD CDHFA500Q (ICD Generator) IMPLANT
KIT MICROPUNCTURE NIT STIFF (SHEATH) IMPLANT
LEAD DURATA 7122Q-65CM (Lead) IMPLANT
LEAD QUARTET 1456Q-86 (Lead) IMPLANT
LEAD ULTIPACE 52 LPA1231/52 (Lead) IMPLANT
PAD DEFIB RADIO PHYSIO CONN (PAD) ×1 IMPLANT
QUARTET 1456Q-86 (Lead) ×1 IMPLANT
SHEATH 7FR PRELUDE SNAP 13 (SHEATH) IMPLANT
SHEATH 9.5FR PRELUDE SNAP 13 (SHEATH) IMPLANT
SLITTER AGILIS HISPRO (INSTRUMENTS) IMPLANT
TRAY PACEMAKER INSERTION (PACKS) ×1 IMPLANT
WIRE ACUITY WHISPER EDS 4648 (WIRE) IMPLANT
WIRE HI TORQ VERSACORE-J 145CM (WIRE) IMPLANT

## 2023-03-01 NOTE — Interval H&P Note (Signed)
History and Physical Interval Note:  03/01/2023 11:19 AM  Billy Rasmussen  has presented today for surgery, with the diagnosis of cardiomyopathy.  The various methods of treatment have been discussed with the patient and family. After consideration of risks, benefits and other options for treatment, the patient has consented to  Procedure(s): BIV ICD INSERTION CRT-D (N/A) as a surgical intervention.  The patient's history has been reviewed, patient examined, no change in status, stable for surgery.  I have reviewed the patient's chart and labs.  Questions were answered to the patient's satisfaction.     Roberts Gaudy Naileah Karg

## 2023-03-01 NOTE — Discharge Instructions (Signed)
After Your ICD (Implantable Cardiac Defibrillator)   You have a [employer] ICD  ACTIVITY Do not lift your arm above shoulder height for 1 week after your procedure. After 7 days, you may progress as below.  You should remove your sling 24 hours after your procedure, unless otherwise instructed by your provider.     Monday March 08, 2023  Tuesday March 09, 2023 Wednesday March 10, 2023 Thursday March 11, 2023   Do not lift, push, pull, or carry anything over 10 pounds with the affected arm until 6 weeks (Monday April 12, 2023 ) after your procedure.   You may drive AFTER your wound check, unless you have been told otherwise by your provider.   Ask your healthcare provider when you can go back to work   INCISION/Dressing If you are on a blood thinner such as Coumadin, Xarelto, Eliquis, Plavix, or Pradaxa please confirm with your provider when this should be resumed.   If large square, outer bandage is left in place, this can be removed after 24 hours from your procedure. Do not remove steri-strips or glue as below.   Monitor your defibrillator site for redness, swelling, and drainage. Call the device clinic at [phone] if you experience these symptoms or fever/chills.  If your incision is sealed with Steri-strips or staples, you may shower 7 days after your procedure or when told by your provider. Do not remove the steri-strips or let the shower hit directly on your site. You may wash around your site with soap and water.    If you were discharged in a sling, please do not wear this during the day more than 48 hours after your surgery unless otherwise instructed. This may increase the risk of stiffness and soreness in your shoulder.   Avoid lotions, ointments, or perfumes over your incision until it is well-healed.  You may use a hot tub or a pool AFTER your wound check appointment if the incision is completely closed.  Your ICD is designed to protect you from life  threatening heart rhythms. Because of this, you may receive a shock.   1 shock with no symptoms:  Call the office during business hours. 1 shock with symptoms (chest pain, chest pressure, dizziness, lightheadedness, shortness of breath, overall feeling unwell):  Call 911. If you experience 2 or more shocks in 24 hours:  Call 911. If you receive a shock, you should not drive for 6 months per the [place] DMV IF you receive appropriate therapy from your ICD.   ICD Alerts:  Some alerts are vibratory and others beep. These are NOT emergencies. Please call our office to let us know. If this occurs at night or on weekends, it can wait until the next business day. Send a remote transmission.  If your device is capable of reading fluid status (for heart failure), you will be offered monthly monitoring to review this with you.   DEVICE MANAGEMENT Remote monitoring is used to monitor your ICD from home. This monitoring is scheduled every 91 days by our office. It allows [place] to keep an eye on the functioning of your device to ensure it is working properly. You will routinely see your Electrophysiologist annually (more often if necessary).   You should receive your ID card for your new device in 4-8 weeks. Keep this card with you at all times once received. Consider wearing a medical alert bracelet or necklace.  Your ICD  may be MRI compatible. This will be discussed at your next  office visit/wound check.  You should avoid contact with strong electric or magnetic fields.   Do not use amateur (ham) radio equipment or electric (arc) welding torches. MP3 player headphones with magnets should not be used. Some devices are safe to use if held at least 12 inches (30 cm) from your defibrillator. These include power tools, lawn mowers, and speakers. If you are unsure if something is safe to use, ask your health care provider.  When using your cell phone, hold it to the ear that is on the opposite side from the  defibrillator. Do not leave your cell phone in a pocket over the defibrillator.  You may safely use electric blankets, heating pads, computers, and microwave ovens.  Call the office right away if: You have chest pain. You feel more than one shock. You feel more short of breath than you have felt before. You feel more light-headed than you have felt before. Your incision starts to open up.  This information is not intended to replace advice given to you by your health care provider. Make sure you discuss any questions you have with your health care provider.

## 2023-03-02 ENCOUNTER — Encounter (HOSPITAL_COMMUNITY): Payer: Self-pay | Admitting: Cardiovascular Disease

## 2023-03-04 ENCOUNTER — Other Ambulatory Visit: Payer: Self-pay

## 2023-03-10 ENCOUNTER — Telehealth: Payer: Self-pay | Admitting: Cardiovascular Disease

## 2023-03-10 NOTE — Telephone Encounter (Signed)
Spoke with ENT and patient needs a thyroidectomy. When she spoke to patient he informed her that he just had his ICD insertion. ENT would like to schedule surgery for 04/14/23. They would like to know is that date to soon? Or does surgery needs to be pushed back because he just did ICD insertion. Did advise to send clearance to office

## 2023-03-10 NOTE — Telephone Encounter (Signed)
  Pt c/o Shortness Of Breath: STAT if SOB developed within the last 24 hours or pt is noticeably SOB on the phone  1. Are you currently SOB (can you hear that pt is SOB on the phone)? No   2. How long have you been experiencing SOB? Couple of weeks   3. Are you SOB when sitting or when up moving around? Moving around   4. Are you currently experiencing any other symptoms? Night sweats    Pt said, he still continue getting SOB and every night he gets night sweats. He would like to know if this is normal after getting ICD

## 2023-03-10 NOTE — Telephone Encounter (Signed)
Office calling to talk with a nurse about this pt because of just getting his defibrillator put in. Please advise

## 2023-03-10 NOTE — Telephone Encounter (Signed)
Patient states since having surgery he has been having nights sweats and he is still having some SOB. No chest pain. He stated he just wanted to inform you and see what you thought of the night sweats.

## 2023-03-11 ENCOUNTER — Telehealth: Payer: Self-pay | Admitting: Cardiology

## 2023-03-11 ENCOUNTER — Ambulatory Visit: Payer: Medicare HMO | Admitting: Internal Medicine

## 2023-03-11 ENCOUNTER — Other Ambulatory Visit: Payer: Medicare HMO

## 2023-03-11 NOTE — Telephone Encounter (Signed)
Pre-operative Risk Assessment    Patient Name: Billy Rasmussen  DOB: 08/02/1945 MRN: [id number]      Request for Surgical Clearance    Procedure:   Total Thyroidectomy   Date of Surgery:  Clearance 04/14/23                                 Surgeon:  Dr. Loran Senters Group or Practice Name:  [facility] Phone number:  [phone] Fax number:  [phone]   Type of Clearance Requested:   - Medical    Type of Anesthesia:  General    Additional requests/questions:  Please fax a copy of medical clearance to the surgeon's office.  Mardelle Matte   03/11/2023, 2:38 PM

## 2023-03-11 NOTE — Telephone Encounter (Signed)
Spoke with Albin Felling at ENT. She is aware Dr. Nelly Laurence states patient can have surgery in November. Albin Felling will fax over clearance form

## 2023-03-11 NOTE — Telephone Encounter (Signed)
Spoke with patient and he does not have any signs of infection. Patient needs thyroidectomy per ENT.  Dr. Nelly Laurence states this explains his night seats. Please see second encounter.

## 2023-03-12 NOTE — Telephone Encounter (Signed)
Name: RHYLAND DEKAY  DOB: [birth year]  MRN: [id number]  Primary Cardiologist: Nicki Guadalajara, MD  Chart reviewed as part of pre-operative protocol coverage. The patient has an upcoming visit scheduled with Dr. Bjorn Pippin on 03/18/2023 at which time clearance can be addressed in case there are any issues that would impact surgical recommendations.  Total thyroidectomy is not scheduled until 11/13/2024as below. I added preop FYI to appointment note so that provider is aware to address at time of outpatient visit.  Per office protocol the cardiology provider should forward their finalized clearance decision and recommendations regarding antiplatelet therapy to the requesting party below.    I will route this message as FYI to requesting party and remove this message from the preop box as separate preop APP input not needed at this time.   Please call with any questions.  Joylene Grapes, NP  03/12/2023, 11:25 AM

## 2023-03-16 ENCOUNTER — Encounter: Payer: Self-pay | Admitting: Family Medicine

## 2023-03-16 ENCOUNTER — Ambulatory Visit: Payer: Medicare HMO | Admitting: Family Medicine

## 2023-03-16 ENCOUNTER — Ambulatory Visit (INDEPENDENT_AMBULATORY_CARE_PROVIDER_SITE_OTHER): Payer: Medicare HMO

## 2023-03-16 VITALS — BP 109/57 | HR 100 | Temp 98.4°F | Ht 66.0 in | Wt 215.0 lb

## 2023-03-16 DIAGNOSIS — R0602 Shortness of breath: Secondary | ICD-10-CM

## 2023-03-16 DIAGNOSIS — R051 Acute cough: Secondary | ICD-10-CM | POA: Diagnosis not present

## 2023-03-16 DIAGNOSIS — R059 Cough, unspecified: Secondary | ICD-10-CM | POA: Diagnosis not present

## 2023-03-16 DIAGNOSIS — Z96611 Presence of right artificial shoulder joint: Secondary | ICD-10-CM | POA: Diagnosis not present

## 2023-03-16 DIAGNOSIS — Z96612 Presence of left artificial shoulder joint: Secondary | ICD-10-CM | POA: Diagnosis not present

## 2023-03-16 MED ORDER — AIRSUPRA 90-80 MCG/ACT IN AERO
2.0000 | INHALATION_SPRAY | Freq: Four times a day (QID) | RESPIRATORY_TRACT | Status: DC | PRN
Start: 2023-03-16 — End: 2023-08-22

## 2023-03-16 NOTE — Progress Notes (Signed)
Subjective: CC: Hospital discharge follow-up PCP: Raliegh Ip, DO [id number] W Sinkfield is a 77 y.o. male presenting to clinic today for:  1.  Hospital discharge follow-up Patient had his pacemaker/defibrillator placed.  He really has not noticed any difference in his shortness of breath.  He has been having some night sweats.  Reports no hemoptysis.  Occasionally does have some cough.  Reports no fluid retention.  Is a former smoker.  Not currently on any inhalers.  Denies any overt sinus drainage but has Flonase on hand if gets congested.   ROS: Per HPI  No Known Allergies Past Medical History:  Diagnosis Date   Arthritis    knees, shoulders, elbows   Bladder cancer [facility])    urologist-  dr Mena Goes   CHF (congestive heart failure) (HCC)    Diverticulosis of colon    Fibromyalgia    GERD (gastroesophageal reflux disease)    History of diverticulitis of colon    History of gastric ulcer    due to aleve   Hypertension    Lower urinary tract symptoms (LUTS)    OSA (obstructive sleep apnea)    NON-COMPLIANT  CPAP  --- BUT PT USES OXYGEN AT NIGHT 2.5L VIA [place] (PT'S DECISION)   PONV (postoperative nausea and vomiting)    Psoriasis    Sepsis (HCC)    january 2021   Tinnitus    right ear more, has tranmitter in right ear removable at hs   Wears glasses    Wears partial dentures     Current Outpatient Medications:    acetaminophen-codeine (TYLENOL #3) 300-30 MG tablet, Take 1 tablet by mouth every 8 (eight) hours as needed for moderate pain., Disp: 30 tablet, Rfl: 0   benzonatate (TESSALON) 100 MG capsule, Take 1 capsule (100 mg total) by mouth 3 (three) times daily as needed for cough., Disp: 20 capsule, Rfl: 0   docusate sodium (COLACE) 100 MG capsule, Take 100 mg by mouth daily as needed for mild constipation., Disp: , Rfl:    fluticasone (FLONASE) 50 MCG/ACT nasal spray, Place 1 spray into both nostrils daily as needed for rhinitis., Disp: , Rfl:    furosemide  (LASIX) 20 MG tablet, Take 1 tablet (20 mg) daily as needed if you gain more than 3 pounds in 1 day or 5 pounds in 1 week., Disp: 45 tablet, Rfl: 3   levothyroxine (SYNTHROID) 25 MCG tablet, Take 1 tablet (25 mcg total) by mouth daily before breakfast. Take on an empty stomach with water only.  Wait to consume other foods/ drinks/ meds for 30 minutes., Disp: 90 tablet, Rfl: 3   metoprolol succinate (TOPROL-XL) 25 MG 24 hr tablet, Take 1 tablet (25 mg total) by mouth daily., Disp: 90 tablet, Rfl: 1   Multiple Vitamin (MULTIVITAMIN) capsule, Take 1 capsule by mouth daily., Disp: , Rfl:    polyvinyl alcohol (LIQUIFILM TEARS) 1.4 % ophthalmic solution, Place 1 drop into both eyes as needed for dry eyes., Disp: 15 mL, Rfl: 0   atorvastatin (LIPITOR) 20 MG tablet, Take 1 tablet (20 mg total) by mouth daily., Disp: 90 tablet, Rfl: 3   losartan (COZAAR) 25 MG tablet, Take 1 tablet (25 mg total) by mouth daily., Disp: 90 tablet, Rfl: 1   spironolactone (ALDACTONE) 25 MG tablet, Take 0.5 tablets (12.5 mg total) by mouth daily., Disp: 90 tablet, Rfl: 3 Social History   Socioeconomic History   Marital status: Married    Spouse name: Peggy   Number of  children: 2   Years of education: 14   Highest education level: Associate degree: occupational, Scientist, product/process development, or vocational program  Occupational History   Occupation: retired    Comment: [employer], 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   Tobacco Use   Smoking status: Former    Current packs/day: 0.00    Average packs/day: 2.0 packs/day for 40.0 years (80.0 ttl pk-yrs)    Types: Cigarettes    Start date: 06/01/1957    Quit date: 06/01/1997    Years since quitting: 25.8   Smokeless tobacco: Never  Vaping Use   Vaping status: Never Used  Substance and Sexual Activity   Alcohol use: No   Drug use: No   Sexual activity: Not Currently  Other Topics Concern   Not on file  Social History Narrative   One level home with wife    14/745 - 26 year old MIL lives with them   Caffiene 2  cups daily, tea occas.   Retired, Visual merchandiser   Social Determinants of Corporate investment banker Strain: Medium Risk (12/11/2022)   Overall Financial Resource Strain (CARDIA)    Difficulty of Paying Living Expenses: Somewhat hard  Food Insecurity: No Food Insecurity (12/11/2022)   Hunger Vital Sign    Worried About Running Out of Food in the Last Year: Never true    Ran Out of Food in the Last Year: Never true  Transportation Needs: No Transportation Needs (12/11/2022)   PRAPARE - Administrator, Civil Service (Medical): No    Lack of Transportation (Non-Medical): No  Physical Activity: Inactive (12/11/2022)   Exercise Vital Sign    Days of Exercise per Week: 0 days    Minutes of Exercise per Session: 0 min  Stress: No Stress Concern Present (12/11/2022)   [employer] of Occupational Health - Occupational Stress Questionnaire    Feeling of Stress : Not at all  Social Connections: Socially Integrated (12/11/2022)   Social Connection and Isolation Panel [NHANES]    Frequency of Communication with Friends and Family: Three times a week    Frequency of Social Gatherings with Friends and Family: Once a week    Attends Religious Services: 1 to 4 times per year    Active Member of [employer] or Organizations: Yes    Attends Banker Meetings: 1 to 4 times per year    Marital Status: Married  Catering manager Violence: Not At Risk (10/07/2022)   Humiliation, Afraid, Rape, and Kick questionnaire    Fear of Current or Ex-Partner: No    Emotionally Abused: No    Physically Abused: No    Sexually Abused: No   Family History  Problem Relation Age of Onset   Alzheimer's disease Mother    Heart attack Father    Heart disease Father    Irregular heart beat Brother        DEFIB.   Congestive Heart Failure Brother    Diabetes Daughter    Sleep apnea Neg Hx     Objective: Office vital signs reviewed. BP (!) 109/57   Pulse 100   Temp 98.4 F (36.9 C)   Ht 5\' 6"   (1.676 m)   Wt 215 lb (97.5 kg)   SpO2 96%   BMI 34.70 kg/m   Physical Examination:  General: Awake, alert, well nourished, No acute distress HEENT: sclera white, MMM Cardio: regular rate and rhythm, S1S2 heard, no murmurs appreciated Pulm: clear to auscultation bilaterally, no wheezes, rhonchi or rales; normal work of breathing on room  air Chest: Postsurgical site healing well and without any evidence of complication or infection  Assessment/ Plan: 77 y.o. male   Acute cough - Plan: DG Chest 2 View  Shortness of breath - Plan: DG Chest 2 View, Albuterol-Budesonide (AIRSUPRA) 90-80 MCG/ACT AERO  Chest x-ray without any pulmonary infiltrates or evidence of acute CHF.  I have given him a sample of Airsupra.  Discussed rinsing mouth after each use.  If utilizing more than twice per week would recommend daily inhaler if he in fact response to this.  He is set up for thyroidectomy.  Uncertain if goiters perhaps causing some of this irritation and reported difficulty breathing.  His pulmonary exam was totally unremarkable today   Raliegh Ip, DO [facility] [phone]

## 2023-03-17 ENCOUNTER — Ambulatory Visit: Payer: Medicare HMO | Attending: Cardiovascular Disease

## 2023-03-17 DIAGNOSIS — I5042 Chronic combined systolic (congestive) and diastolic (congestive) heart failure: Secondary | ICD-10-CM

## 2023-03-17 LAB — CUP PACEART INCLINIC DEVICE CHECK
Battery Remaining Longevity: 99 mo
Brady Statistic RA Percent Paced: 2.6 %
Brady Statistic RV Percent Paced: 99.54 %
Date Time Interrogation Session: 20241016174347
HighPow Impedance: 50.625
Implantable Lead Connection Status: 753985
Implantable Lead Connection Status: 753985
Implantable Lead Connection Status: 753985
Implantable Lead Implant Date: 20240930
Implantable Lead Implant Date: 20240930
Implantable Lead Implant Date: 20240930
Implantable Lead Location: 753858
Implantable Lead Location: 753859
Implantable Lead Location: 753860
Implantable Pulse Generator Implant Date: 20240930
Lead Channel Impedance Value: 412.5 Ohm
Lead Channel Impedance Value: 450 Ohm
Lead Channel Impedance Value: 950 Ohm
Lead Channel Pacing Threshold Amplitude: 0.5 V
Lead Channel Pacing Threshold Amplitude: 0.5 V
Lead Channel Pacing Threshold Amplitude: 0.75 V
Lead Channel Pacing Threshold Amplitude: 0.75 V
Lead Channel Pacing Threshold Amplitude: 1.25 V
Lead Channel Pacing Threshold Amplitude: 1.25 V
Lead Channel Pacing Threshold Pulse Width: 0.4 ms
Lead Channel Pacing Threshold Pulse Width: 0.4 ms
Lead Channel Pacing Threshold Pulse Width: 0.4 ms
Lead Channel Pacing Threshold Pulse Width: 0.4 ms
Lead Channel Pacing Threshold Pulse Width: 0.5 ms
Lead Channel Pacing Threshold Pulse Width: 0.5 ms
Lead Channel Sensing Intrinsic Amplitude: 11.8 mV
Lead Channel Sensing Intrinsic Amplitude: 3.1 mV
Lead Channel Setting Pacing Amplitude: 1.375
Lead Channel Setting Pacing Amplitude: 1.625
Lead Channel Setting Pacing Amplitude: 1.75 V
Lead Channel Setting Pacing Pulse Width: 0.4 ms
Lead Channel Setting Pacing Pulse Width: 0.4 ms
Lead Channel Setting Sensing Sensitivity: 0.5 mV
Pulse Gen Serial Number: 211030215
Zone Setting Status: 755011

## 2023-03-17 NOTE — Progress Notes (Signed)
Cardiology Office Note:    Date:  03/21/2023   ID:  Billy Rasmussen, Billy Rasmussen December 15, 1945, MRN [id number]  PCP:  Raliegh Ip, DO  Cardiologist:  Little Ishikawa, MD  Electrophysiologist:  Maurice Small, MD   Referring MD: Raliegh Ip, DO   Chief Complaint  Patient presents with   Follow-up    3 months. Pre-op clearance.   Headache   Shortness of Breath   Chest Pain    Pressure.    History of Present Illness:    Billy Rasmussen is a 77 y.o. male with a hx of chronic combined systolic and diastolic heart failure, bladder cancer s/p left nephrectomy, cystoprostatectomy , fibromyalgia, hypertension, OSA not on CPAP, psoriasis who presents for follow-up.  He was initially seen as an ED follow-up on 10/06/2022.  He was seen in the ED on 09/27/2022 for shortness of breath.  Chest x-ray shows interstitial edema and mildly elevated BNP.  CTPA showed no PE but findings suggestive of pulmonary edema, as well as 4.5 cm ascending aortic aneurysm.  He was given Lasix with improvement and discharged on p.o. Lasix 20 mg daily with referral to cardiology.  Echocardiogram 04/2019 showed EF 55-60%, ascending aortic aneurysm measuring 45 mm, normal RV function, no significant valvular disease.  At initial clinic visit on 10/06/2022, he reported fever and was hypotensive.  He was sent to the ED.  He was admitted from 5/7 through 10/13/2022.  He was treated for UTI.  Echo 10/17/2022 showed EF less than 20%, mildly reduced RV systolic dysfunction, RVSP 44, severe left atrial enlargement, mild MR, dilated ascending aorta measuring 45 mm.  RHC/LHC on 10/09/2022 showed nonobstructive CAD (30% RCA stenosis, RA 6 RV 50/3, PA 55/26/37 PCWP 27 CI 3.85.  Had AKI during admission, diuresis was held and he was discharged on as needed Lasix.  Zio patch x 10 days 10/2022 showed 1 episode of NSVT lasting 8 beats, 10 episodes of SVT with longest lasting 19 beats.  Echocardiogram 12/2022 showed EF less than 20%,  grade 2 diastolic dysfunction, elevated left atrial pressure, mild RV dysfunction, mild to moderate MR, dilated ascending aorta measuring 44 mm.  He was referred to EP and underwent BiV ICD insertion with Dr. Nelly Laurence on 03/01/2023.  Since last clinic visit, he reports he is doing okay.  Denies any chest pain recently.  Does report occasional shortness of breath.  States can walk up a flight of stairs without stopping, denies any chest pain or shortness of breath with this.  Reports some lightheadedness but no syncope.  Denies any lower extremity edema.  He is taking Lasix 3-4 times per week.   BP Readings from Last 3 Encounters:  03/18/23 92/68  03/16/23 (!) 109/57  03/01/23 138/75   Wt Readings from Last 3 Encounters:  03/18/23 215 lb (97.5 kg)  03/16/23 215 lb (97.5 kg)  03/01/23 212 lb (96.2 kg)     Past Medical History:  Diagnosis Date   Arthritis    knees, shoulders, elbows   Bladder cancer [facility])    urologist-  dr Mena Goes   CHF (congestive heart failure) (HCC)    Diverticulosis of colon    Fibromyalgia    GERD (gastroesophageal reflux disease)    History of diverticulitis of colon    History of gastric ulcer    due to aleve   Hypertension    Lower urinary tract symptoms (LUTS)    OSA (obstructive sleep apnea)    NON-COMPLIANT  CPAP  ---  BUT PT USES OXYGEN AT NIGHT 2.5L VIA [place] (PT'S DECISION)   PONV (postoperative nausea and vomiting)    Psoriasis    Sepsis (HCC)    january 2021   Tinnitus    right ear more, has tranmitter in right ear removable at hs   Wears glasses    Wears partial dentures     Past Surgical History:  Procedure Laterality Date   APPENDECTOMY     BIV ICD INSERTION CRT-D N/A 03/01/2023   Procedure: BIV ICD INSERTION CRT-D;  Surgeon: Maurice Small, MD;  [facility];  Service: Cardiovascular;  Laterality: N/A;   COLECTOMY W/ COLOSTOMY  1996   W/   APPENDECTOMY   COLONOSCOPY N/A 05/11/2018   Procedure: COLONOSCOPY;  Surgeon:  Corbin Ade, MD;  [facility];  Service: Endoscopy;  Laterality: N/A;  9:30   COLOSTOMY TAKEDOWN  1996   CYSTOSCOPY WITH BIOPSY N/A 12/05/2013   Procedure: CYSTO BLADDER BIOPSY AND FULGERATION;  Surgeon: Jerilee Field, MD;  Location: [facility];  Service: Urology;  Laterality: N/A;   CYSTOSCOPY WITH BIOPSY Bilateral 11/13/2014   Procedure: CYSTOSCOPY WITH  BLADDER BIOPSY FULGERATION AND BILATERAL RETROGRADE PYELOGRAMS;  Surgeon: Jerilee Field, MD;  Location[facility];  Service: Urology;  Laterality: Bilateral;   CYSTOSCOPY WITH FULGERATION N/A 01/18/2018   Procedure: CYSTOSCOPY WITH FULGERATION/ BLADDER BIOPSY;  Surgeon: Jerilee Field, MD;  Location[facility];  Service: Urology;  Laterality: N/A;   CYSTOSCOPY WITH INJECTION N/A 11/09/2018   Procedure: CYSTOSCOPY WITH INJECTION OF INDOCYANINE GREEN DYE;  Surgeon: Sebastian Ache, MD;  Location: [facility];  Service: Urology;  Laterality: N/A;   CYSTOSCOPY WITH INSERTION OF UROLIFT N/A 01/18/2018   Procedure: CYSTOSCOPY WITH INSERTION OF UROLIFT;  Surgeon: Jerilee Field, MD;  Location: [facility];  Service: Urology;  Laterality: N/A;   CYSTOSCOPY/URETEROSCOPY/HOLMIUM LASER Left 02/17/2019   Procedure: URETEROSCOPY WITH BALLOON DILATION  AND NEPHROSTOGRAM;  Surgeon: Sebastian Ache, MD;  Location: [facility];  Service: Urology;  Laterality: Left;  1 HR   EXCISION RIGHT UPPER ARM LIPOMA  2005   HEMORROIDECTOMY     INGUINAL HERNIA REPAIR Left 1984   IR NEPHROSTOMY PLACEMENT LEFT  01/05/2019   KNEE ARTHROSCOPY Left X3  LAST ONE  2002   ORIF LEFT HUMEROUS FX  1976   POLYPECTOMY  05/11/2018   Procedure: POLYPECTOMY;  Surgeon: Corbin Ade, MD;  [facility];  Service: Endoscopy;;   PROSTATE SURGERY     RIGHT HEART CATH AND CORONARY ANGIOGRAPHY N/A 10/09/2022   Procedure: RIGHT HEART CATH AND CORONARY ANGIOGRAPHY;  Surgeon: [name], MD;  [facility];  Service: Cardiovascular;  Laterality: N/A;   ROBOT ASSISTED LAPAROSCOPIC NEPHRECTOMY Left 07/14/2019   Procedure: XI ROBOTIC ASSISTED LAPAROSCOPIC RETROPERITONEAL NEPHRECTOMY;  Surgeon: Sebastian Ache, MD;  Location: [facility];  Service: Urology;  Laterality: Left;  3 HRS   TONSILLECTOMY  AS CHILD   TOTAL KNEE ARTHROPLASTY Left 2006   REVISION 2007  (AFTER I & D WITH ANTIBIOTIC SPACER PROCEDURE FOR STEPH INFECTION)   TOTAL KNEE ARTHROPLASTY Right 03/30/2016   Procedure: RIGHT TOTAL KNEE ARTHROPLASTY;  Surgeon: Durene Romans, MD;  Location: [facility];  Service: Orthopedics;  Laterality: Right;   TOTAL SHOULDER ARTHROPLASTY Right 04/06/2013   Procedure: RIGHT TOTAL SHOULDER ARTHROPLASTY;  Surgeon: Senaida Lange, MD;  [facility];  Service: Orthopedics;  Laterality: Right;   TOTAL  SHOULDER ARTHROPLASTY Left 07/20/2013   Procedure: LEFT TOTAL SHOULDER ARTHROPLASTY;  Surgeon: Senaida Lange, MD;  Location: [facility];  Service: Orthopedics;  Laterality: Left;   TRANSURETHRAL RESECTION OF BLADDER TUMOR N/A 11/12/2015   Procedure: TRANSURETHRAL RESECTION OF BLADDER TUMOR (TURBT);  Surgeon: Jerilee Field, MD;  Location: [facility];  Service: Urology;  Laterality: N/A;   TRANSURETHRAL RESECTION OF BLADDER TUMOR WITH GYRUS (TURBT-GYRUS) N/A 12/05/2013   Procedure: TRANSURETHRAL RESECTION OF BLADDER TUMOR WITH GYRUS (TURBT-GYRUS);  Surgeon: Jerilee Field, MD;  Location: [facility];  Service: Urology;  Laterality: N/A;    Current Medications: Current Meds  Medication Sig   acetaminophen-codeine (TYLENOL #3) 300-30 MG tablet Take 1 tablet by mouth every 8 (eight) hours as needed for moderate pain.   Albuterol-Budesonide (AIRSUPRA) 90-80 MCG/ACT AERO Inhale 2 puffs into the lungs every 6 (six) hours as needed (shortness of breath/ wheeze).   benzonatate (TESSALON) 100 MG capsule Take 1 capsule (100 mg total) by mouth 3 (three) times daily as  needed for cough.   docusate sodium (COLACE) 100 MG capsule Take 100 mg by mouth daily as needed for mild constipation.   fluticasone (FLONASE) 50 MCG/ACT nasal spray Place 1 spray into both nostrils daily as needed for rhinitis.   furosemide (LASIX) 20 MG tablet Take 1 tablet (20 mg) daily as needed if you gain more than 3 pounds in 1 day or 5 pounds in 1 week.   levothyroxine (SYNTHROID) 25 MCG tablet Take 1 tablet (25 mcg total) by mouth daily before breakfast. Take on an empty stomach with water only.  Wait to consume other foods/ drinks/ meds for 30 minutes.   metoprolol succinate (TOPROL-XL) 25 MG 24 hr tablet Take 1 tablet (25 mg total) by mouth daily.   Multiple Vitamin (MULTIVITAMIN) capsule Take 1 capsule by mouth daily.   polyvinyl alcohol (LIQUIFILM TEARS) 1.4 % ophthalmic solution Place 1 drop into both eyes as needed for dry eyes.     Allergies:   Patient has no known allergies.   Social History   Socioeconomic History   Marital status: Married    Spouse name: Peggy   Number of children: 2   Years of education: 14   Highest education level: Tax adviser degree: occupational, Scientist, product/process development, or vocational program  Occupational History   Occupation: retired    Comment: 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   Tobacco Use   Smoking status: Former    Current packs/day: 0.00    Average packs/day: 2.0 packs/day for 40.0 years (80.0 ttl pk-yrs)    Types: Cigarettes    Start date: 06/01/1957    Quit date: 06/01/1997    Years since quitting: 25.8   Smokeless tobacco: Never  Vaping Use   Vaping status: Never Used  Substance and Sexual Activity   Alcohol use: No   Drug use: No   Sexual activity: Not Currently  Other Topics Concern   Not on file  Social History Narrative   One level home with wife    23/4025 - 45 year old MIL lives with them   Caffiene 2 cups daily, tea occas.   Retired, Visual merchandiser   Social Determinants of Corporate investment banker Strain: Medium Risk (12/11/2022)   Overall  Financial Resource Strain (CARDIA)    Difficulty of Paying Living Expenses: Somewhat hard  Food Insecurity: No Food Insecurity (12/11/2022)   Hunger Vital Sign    Worried About Running Out of Food in the Last Year: Never true  Ran Out of Food in the Last Year: Never true  Transportation Needs: No Transportation Needs (12/11/2022)   PRAPARE - Administrator, Civil Service (Medical): No    Lack of Transportation (Non-Medical): No  Physical Activity: Inactive (12/11/2022)   Exercise Vital Sign    Days of Exercise per Week: 0 days    Minutes of Exercise per Session: 0 min  Stress: No Stress Concern Present (12/11/2022)   [employer] of Occupational Health - Occupational Stress Questionnaire    Feeling of Stress : Not at all  Social Connections: Socially Integrated (12/11/2022)   Social Connection and Isolation Panel [NHANES]    Frequency of Communication with Friends and Family: Three times a week    Frequency of Social Gatherings with Friends and Family: Once a week    Attends Religious Services: 1 to 4 times per year    Active Member of [employer] or Organizations: Yes    Attends Engineer, structural: 1 to 4 times per year    Marital Status: Married     Family History: The patient's family history includes Alzheimer's disease in his mother; Congestive Heart Failure in his brother; Diabetes in his daughter; Heart attack in his father; Heart disease in his father; Irregular heart beat in his brother. There is no history of Sleep apnea.  ROS:   Please see the history of present illness.     All other systems reviewed and are negative.  EKGs/Labs/Other Studies Reviewed:    The following studies were reviewed today:   EKG:   03/18/23: BiV paced, rate 111 10/06/22: Sinus rhythm with first-degree AV block, rate 88, nonspecific intraventricular conduction delay, right axis deviation  Recent Labs: 09/16/2022: TSH 2.090 10/11/2022: B Natriuretic Peptide  183.9 03/18/2023: ALT 36; BUN 24; Creatinine, Ser 1.81; Hemoglobin 14.0; Magnesium 2.0; Platelets 168; Potassium 5.1; Sodium 136  Recent Lipid Panel    Component Value Date/Time   CHOL 93 (L) 03/18/2023 1121   TRIG 253 (H) 03/18/2023 1121   HDL 21 (L) 03/18/2023 1121   CHOLHDL 4.4 03/18/2023 1121   LDLCALC 33 03/18/2023 1121   LDLDIRECT 96 02/25/2016 1352    Physical Exam:    VS:  BP 92/68 (BP Location: Right Arm, Patient Position: Sitting, Cuff Size: Normal)   Pulse (!) 111   Ht 5\' 6"  (1.676 m)   Wt 215 lb (97.5 kg)   BMI 34.70 kg/m     Wt Readings from Last 3 Encounters:  03/18/23 215 lb (97.5 kg)  03/16/23 215 lb (97.5 kg)  03/01/23 212 lb (96.2 kg)     GEN:  Well nourished, well developed in no acute distress HEENT: Normal NECK: No JVD; No carotid bruits CARDIAC: RRR, no murmurs, rubs, gallops RESPIRATORY:  Clear to auscultation without rales, wheezing or rhonchi  ABDOMEN: Soft, non-tender, non-distended MUSCULOSKELETAL:  No edema; No deformity  SKIN: Warm and dry NEUROLOGIC:  Alert and oriented x 3 PSYCHIATRIC:  Normal affect   ASSESSMENT:    1. Chronic combined systolic and diastolic heart failure (HCC)   2. Pre-op evaluation   3. Essential hypertension   4. Coronary artery disease involving native coronary artery of native heart without angina pectoris   5. Aneurysm of ascending aorta without rupture (HCC)   6. CKD stage 3a, GFR 45-59 ml/min (HCC)      PLAN:     Chronic combined heart failure: Echo 10/17/2022 showed EF less than 20%, mildly reduced RV systolic dysfunction, RVSP 44, severe left atrial  enlargement, mild MR, dilated ascending aorta measuring 45 mm.  RHC/LHC on 10/09/2022 showed nonobstructive CAD (30% RCA stenosis, RA 6 RV 50/3, PA 55/26/37 PCWP 27 CI 3.85.  Echocardiogram 12/2022 showed EF remains less than 20%.  He was referred to EP and underwent BiV ICD insertion with Dr. Nelly Laurence on 03/01/2023. -Continue Lasix 20 mg daily as needed.  Would  monitor daily weights and take Lasix if gains more than 3 pounds in 1 day or 5 pounds in 1 week -Continue Toprol-XL 25 mg daily -Continue losartan 25 mg daily.   -Continue spironolactone 12.5 mg daily -Hold off on SGLT2 inhibitor given frequent UTIs -Soft BP, unable to titrate GDMT -He had a piece of metal in his eye and had been unable to obtain MRI.  However, he underwent removal of metal with his dermatologist and was able to undergo liver MRI recently.  Will plan for cardiac MRI to further evaluate his nonischemic cardiomyopathy -Device interrogation 03/17/2023 showed 99.5% BiV pacing.  Will follow-up cardiac MRI as above to monitor for improvement in systolic function with BiV pacing -Check CMET/magnesium  Preop evaluation: Prior to thyroidectomy scheduled for 04/14/2023 for thyroid cancer.  Currently denies exertional chest pain or dyspnea.  He is at elevated risk for surgery given his heart failure, but currently appears compensated.  No further cardiac workup recommended prior to surgery.  CAD: LHC on 10/09/2022 showed nonobstructive CAD with 30% RCA stenosis. -LDL 94 on 07/07/2022, goal less than 70.  Started atorvastatin 20 mg daily.  Check lipid panel  Palpitations: Zio patch x 10 days 10/2022 showed 1 episode of NSVT lasting 8 beats, 10 episodes of SVT with longest lasting 19 beats. -Continue Toprol-XL 25 mg daily  Aortic aneurysm: Ascending aortic aneurysm measuring 45 mm on CTPA 08/2022.  Will monitor  CKD 3A: Has history of left nephrectomy due to bladder cancer.  Baseline creatinine appears 1.3-1.5.  Creatinine was up to 1.8 during admission 09/2022.  Most recent creatinine 1.57 on 02/16/2023.  Check CMET  Hypertension: On Toprol-XL, losartan, and spironolactone as above  OSA: Reports compliance with CPAP  Thyroid cancer: planning thyroidectomy as above  RTC in 3 months  Medication Adjustments/Labs and Tests Ordered: Current medicines are reviewed at length with the patient  today.  Concerns regarding medicines are outlined above.  Orders Placed This Encounter  Procedures   MR CARDIAC MORPHOLOGY W WO CONTRAST   Comprehensive Metabolic Panel (CMET)   Magnesium   CBC w/Diff/Platelet   Lipid panel   EKG 12-Lead   No orders of the defined types were placed in this encounter.   Patient Instructions  Medication Instructions:  Continue all current medications *If you need a refill on your cardiac medications before your next appointment, please call your pharmacy*   Lab Work: CMET, cbc, Lipid, Mg If you have labs (blood work) drawn today and your tests are completely normal, you will receive your results only by: MyChart Message (if you have MyChart) OR A paper copy in the mail If you have any lab test that is abnormal or we need to change your treatment, we will call you to review the results.   Testing/Procedures: Cardiac MRI   WILL BE SCHEDULE AFTER APPROVED BY INSURANCE    You are scheduled for Cardiac MRI at the location below.  Please arrive for your appointment at ______________ . ?  [facility] [address] [place], [place] [address] [phone] Please take advantage of the free valet parking available at the  [facility] (Entrance C).  Proceed to the [facility] Radiology Department (First Floor) for check-in.   OR   [facility] [address] [place], [place] [address] [phone] Please go to the [facility] and check-in with the desk attendant.   Magnetic resonance imaging (MRI) is a painless test that produces images of the inside of the body without using Xrays.  During an MRI, strong magnets and radio waves work together in a Data processing manager to form detailed images.   MRI images may provide more details about a medical condition than X-rays, CT scans, and ultrasounds can provide.  You may be given earphones to listen for instructions.  You may eat a light  breakfast and take medications as ordered with the exception of furosemide, hydrochlorothiazide, or spironolactone(fluid pill, other). If you are undergoing a stress MRI, please avoid stimulants for 12 hr prior to test. (Ie. Caffeine, nicotine, chocolate, or antihistamine medications)  An IV will be inserted into one of your veins. Contrast material will be injected into your IV. It will leave your body through your urine within a day. You may be told to drink plenty of fluids to help flush the contrast material out of your system.  You will be asked to remove all metal, including: Watch, jewelry, and other metal objects including hearing aids, hair pieces and dentures. Also wearable glucose monitoring systems (ie. Freestyle Libre and Omnipods) (Braces and fillings normally are not a problem.)   TEST WILL TAKE APPROXIMATELY 1 HOUR  PLEASE NOTIFY SCHEDULING AT LEAST 24 HOURS IN ADVANCE IF YOU ARE UNABLE TO KEEP YOUR APPOINTMENT. [phone]  For more information and frequently asked questions, please visit our website : [email or web address]  Please call the Cardiac Imaging Nurse Navigators with any questions/concerns. [phone] Office   .   Follow-Up: At [facility], you and your health needs are our priority.  As part of our continuing mission to provide you with exceptional heart care, we have created designated Provider Care Teams.  These Care Teams include your primary Cardiologist (physician) and Advanced Practice Providers (APPs -  Physician Assistants and Nurse Practitioners) who all work together to provide you with the care you need, when you need it.  We recommend signing up for the patient portal called "MyChart".  Sign up information is provided on this After Visit Summary.  MyChart is used to connect with patients for Virtual Visits (Telemedicine).  Patients are able to view lab/test results, encounter notes, upcoming appointments, etc.  Non-urgent  messages can be sent to your provider as well.   To learn more about what you can do with MyChart, go to [email or web address].    Your next appointment:   3 month(s)  Provider:   Little Ishikawa, MD       Signed, Little Ishikawa, MD  03/21/2023 10:16 PM    [facility]

## 2023-03-17 NOTE — Progress Notes (Signed)

## 2023-03-17 NOTE — Patient Instructions (Signed)

## 2023-03-18 ENCOUNTER — Encounter: Payer: Self-pay | Admitting: Cardiology

## 2023-03-18 ENCOUNTER — Ambulatory Visit: Payer: Medicare HMO | Attending: Cardiology | Admitting: Cardiology

## 2023-03-18 VITALS — BP 92/68 | HR 111 | Ht 66.0 in | Wt 215.0 lb

## 2023-03-18 DIAGNOSIS — I5041 Acute combined systolic (congestive) and diastolic (congestive) heart failure: Secondary | ICD-10-CM | POA: Diagnosis not present

## 2023-03-18 DIAGNOSIS — I2510 Atherosclerotic heart disease of native coronary artery without angina pectoris: Secondary | ICD-10-CM

## 2023-03-18 DIAGNOSIS — I10 Essential (primary) hypertension: Secondary | ICD-10-CM

## 2023-03-18 DIAGNOSIS — N1831 Chronic kidney disease, stage 3a: Secondary | ICD-10-CM

## 2023-03-18 DIAGNOSIS — E861 Hypovolemia: Secondary | ICD-10-CM | POA: Diagnosis not present

## 2023-03-18 DIAGNOSIS — I7121 Aneurysm of the ascending aorta, without rupture: Secondary | ICD-10-CM | POA: Diagnosis not present

## 2023-03-18 DIAGNOSIS — I5042 Chronic combined systolic (congestive) and diastolic (congestive) heart failure: Secondary | ICD-10-CM | POA: Diagnosis not present

## 2023-03-18 DIAGNOSIS — Z01818 Encounter for other preprocedural examination: Secondary | ICD-10-CM

## 2023-03-18 NOTE — Patient Instructions (Signed)
Medication Instructions:  Continue all current medications *If you need a refill on your cardiac medications before your next appointment, please call your pharmacy*   Lab Work: CMET, cbc, Lipid, Mg If you have labs (blood work) drawn today and your tests are completely normal, you will receive your results only by: MyChart Message (if you have MyChart) OR A paper copy in the mail If you have any lab test that is abnormal or we need to change your treatment, we will call you to review the results.   Testing/Procedures: Cardiac MRI   WILL BE SCHEDULE AFTER APPROVED BY INSURANCE    You are scheduled for Cardiac MRI at the location below.  Please arrive for your appointment at ______________ . ?  [facility] [address] [place], [place] [address] [phone] Please take advantage of the free valet parking available at the [facility] [employer] (Entrance C).  Proceed to the [facility] Radiology Department (First Floor) for check-in.   OR   [facility] [address] [place], [place] [address] [phone] Please go to the [facility] and check-in with the desk attendant.   Magnetic resonance imaging (MRI) is a painless test that produces images of the inside of the body without using Xrays.  During an MRI, strong magnets and radio waves work together in a Data processing manager to form detailed images.   MRI images may provide more details about a medical condition than X-rays, CT scans, and ultrasounds can provide.  You may be given earphones to listen for instructions.  You may eat a light breakfast and take medications as ordered with the exception of furosemide, hydrochlorothiazide, or spironolactone(fluid pill, other). If you are undergoing a stress MRI, please avoid stimulants for 12 hr prior to test. (Ie. Caffeine, nicotine, chocolate, or antihistamine medications)  An IV will be inserted into one of your veins. Contrast  material will be injected into your IV. It will leave your body through your urine within a day. You may be told to drink plenty of fluids to help flush the contrast material out of your system.  You will be asked to remove all metal, including: Watch, jewelry, and other metal objects including hearing aids, hair pieces and dentures. Also wearable glucose monitoring systems (ie. Freestyle Libre and Omnipods) (Braces and fillings normally are not a problem.)   TEST WILL TAKE APPROXIMATELY 1 HOUR  PLEASE NOTIFY SCHEDULING AT LEAST 24 HOURS IN ADVANCE IF YOU ARE UNABLE TO KEEP YOUR APPOINTMENT. [phone]  For more information and frequently asked questions, please visit our website : [email or web address]  Please call the Cardiac Imaging Nurse Navigators with any questions/concerns. [phone] Office   .   Follow-Up: At [facility], you and your health needs are our priority.  As part of our continuing mission to provide you with exceptional heart care, we have created designated Provider Care Teams.  These Care Teams include your primary Cardiologist (physician) and Advanced Practice Providers (APPs -  Physician Assistants and Nurse Practitioners) who all work together to provide you with the care you need, when you need it.  We recommend signing up for the patient portal called "MyChart".  Sign up information is provided on this After Visit Summary.  MyChart is used to connect with patients for Virtual Visits (Telemedicine).  Patients are able to view lab/test results, encounter notes, upcoming appointments, etc.  Non-urgent messages can be sent to your provider as well.   To learn more  about what you can do with MyChart, go to [email or web address].    Your next appointment:   3 month(s)  Provider:   Little Ishikawa, MD

## 2023-03-19 ENCOUNTER — Encounter: Payer: Self-pay | Admitting: Oncology

## 2023-03-19 ENCOUNTER — Other Ambulatory Visit: Payer: Self-pay

## 2023-03-19 DIAGNOSIS — I10 Essential (primary) hypertension: Secondary | ICD-10-CM

## 2023-03-19 DIAGNOSIS — I5042 Chronic combined systolic (congestive) and diastolic (congestive) heart failure: Secondary | ICD-10-CM

## 2023-03-19 LAB — CBC WITH DIFFERENTIAL/PLATELET
Basophils Absolute: 0 10*3/uL (ref 0.0–0.2)
Basos: 0 %
EOS (ABSOLUTE): 0.3 10*3/uL (ref 0.0–0.4)
Eos: 4 %
Hematocrit: 42.5 % (ref 37.5–51.0)
Hemoglobin: 14 g/dL (ref 13.0–17.7)
Immature Grans (Abs): 0 10*3/uL (ref 0.0–0.1)
Immature Granulocytes: 0 %
Lymphocytes Absolute: 1.7 10*3/uL (ref 0.7–3.1)
Lymphs: 24 %
MCH: 30.9 pg (ref 26.6–33.0)
MCHC: 32.9 g/dL (ref 31.5–35.7)
MCV: 94 fL (ref 79–97)
Monocytes Absolute: 0.7 10*3/uL (ref 0.1–0.9)
Monocytes: 10 %
Neutrophils Absolute: 4.3 10*3/uL (ref 1.4–7.0)
Neutrophils: 62 %
Platelets: 168 10*3/uL (ref 150–450)
RBC: 4.53 x10E6/uL (ref 4.14–5.80)
RDW: 13.9 % (ref 11.6–15.4)
WBC: 7.1 10*3/uL (ref 3.4–10.8)

## 2023-03-19 LAB — COMPREHENSIVE METABOLIC PANEL
ALT: 36 [IU]/L (ref 0–44)
AST: 53 [IU]/L — ABNORMAL HIGH (ref 0–40)
Albumin: 4.5 g/dL (ref 3.8–4.8)
Alkaline Phosphatase: 84 [IU]/L (ref 44–121)
BUN/Creatinine Ratio: 13 (ref 10–24)
BUN: 24 mg/dL (ref 8–27)
Bilirubin Total: 0.7 mg/dL (ref 0.0–1.2)
CO2: 25 mmol/L (ref 20–29)
Calcium: 9.8 mg/dL (ref 8.6–10.2)
Chloride: 98 mmol/L (ref 96–106)
Creatinine, Ser: 1.81 mg/dL — ABNORMAL HIGH (ref 0.76–1.27)
Globulin, Total: 2.7 g/dL (ref 1.5–4.5)
Glucose: 88 mg/dL (ref 70–99)
Potassium: 5.1 mmol/L (ref 3.5–5.2)
Sodium: 136 mmol/L (ref 134–144)
Total Protein: 7.2 g/dL (ref 6.0–8.5)
eGFR: 39 mL/min/{1.73_m2} — ABNORMAL LOW (ref >59)

## 2023-03-19 LAB — LIPID PANEL
Chol/HDL Ratio: 4.4 {ratio} (ref 0.0–5.0)
Cholesterol, Total: 93 mg/dL — ABNORMAL LOW (ref 100–199)
HDL: 21 mg/dL — ABNORMAL LOW (ref >39)
LDL Chol Calc (NIH): 33 mg/dL (ref 0–99)
Triglycerides: 253 mg/dL — ABNORMAL HIGH (ref 0–149)
VLDL Cholesterol Cal: 39 mg/dL (ref 5–40)

## 2023-03-19 LAB — MAGNESIUM: Magnesium: 2 mg/dL (ref 1.6–2.3)

## 2023-03-22 ENCOUNTER — Ambulatory Visit (HOSPITAL_COMMUNITY)
Admission: RE | Admit: 2023-03-22 | Discharge: 2023-03-22 | Disposition: A | Discharge status: Home / Self Care | Payer: Medicare HMO | Source: Ambulatory Visit | Attending: Surgery | Admitting: Surgery

## 2023-03-22 DIAGNOSIS — I7121 Aneurysm of the ascending aorta, without rupture: Secondary | ICD-10-CM | POA: Diagnosis not present

## 2023-03-22 DIAGNOSIS — E049 Nontoxic goiter, unspecified: Secondary | ICD-10-CM | POA: Diagnosis not present

## 2023-03-25 ENCOUNTER — Telehealth: Payer: Self-pay | Admitting: Medical Oncology

## 2023-03-25 NOTE — Telephone Encounter (Signed)
Emailed scans to pt. Per his request for insurance.

## 2023-03-26 ENCOUNTER — Telehealth: Payer: Self-pay | Admitting: Family Medicine

## 2023-03-26 DIAGNOSIS — Z8551 Personal history of malignant neoplasm of bladder: Secondary | ICD-10-CM | POA: Diagnosis not present

## 2023-03-26 DIAGNOSIS — Z936 Other artificial openings of urinary tract status: Secondary | ICD-10-CM | POA: Diagnosis not present

## 2023-03-26 DIAGNOSIS — N1831 Chronic kidney disease, stage 3a: Secondary | ICD-10-CM

## 2023-03-26 NOTE — Telephone Encounter (Signed)
REFERRAL REQUEST Telephone Note  Have you been seen at our office for this problem? yes (Advise that they may need an appointment with their PCP before a referral can be done)  Reason for Referral: pt has one Kidney he goes to a bladder specialist but wife thinks he needs to be seen by a kidney specialist Referral discussed with patient: yes  Best contact number of patient for referral team: [phone]    Has patient been seen by a specialist for this issue before: yes   Patient provider preference for referral: Dr. Wolfgang Phoenix  Patient location preference for referral: [place]   Patient notified that referrals can take up to a week or longer to process. If they haven't heard anything within a week they should call back and speak with the referral department.

## 2023-03-26 NOTE — Telephone Encounter (Signed)
Please Review

## 2023-03-26 NOTE — Telephone Encounter (Signed)
Glad to send him. I see that he had a recent drop in GFR and this is appropriate. Referral placed.

## 2023-03-29 ENCOUNTER — Other Ambulatory Visit: Payer: Medicare HMO

## 2023-03-29 ENCOUNTER — Ambulatory Visit: Payer: Medicare HMO

## 2023-03-29 DIAGNOSIS — I5042 Chronic combined systolic (congestive) and diastolic (congestive) heart failure: Secondary | ICD-10-CM | POA: Diagnosis not present

## 2023-03-29 DIAGNOSIS — I10 Essential (primary) hypertension: Secondary | ICD-10-CM | POA: Diagnosis not present

## 2023-03-29 NOTE — Progress Notes (Signed)
HPI:  [name] is a 77 year old gentleman with a past history notable for bladder cancer, history of left nephrectomy, splenomegaly, thrombocytopenia, hypertension, chronic systolic heart failure, stage IIIa CKD, status post recent biventricular pacemaker.  He was referred to [place] last year after he was discovered to have a 4.5 cm thoracic aortic aneurysm. He returned today for scheduled follow-up having follow-up noncontrast CT chest on 03/22/2023. He denies having any chest pain or changes in his breathing pattern since his last visit.  He has some chronic shortness of breath and being easily due to his failure.  He said he has not noticed much difference since having the biventricular pacemaker. He brought a lengthy record of his blood pressure and heart rate for the past 2 months.  It appears his blood pressure is consistently well-controlled in the 120s over 80s.   Current Outpatient Medications  Medication Sig Dispense Refill   acetaminophen-codeine (TYLENOL #3) 300-30 MG tablet Take 1 tablet by mouth every 8 (eight) hours as needed for moderate pain. 30 tablet 0   Albuterol-Budesonide (AIRSUPRA) 90-80 MCG/ACT AERO Inhale 2 puffs into the lungs every 6 (six) hours as needed (shortness of breath/ wheeze).     atorvastatin (LIPITOR) 20 MG tablet Take 1 tablet (20 mg total) by mouth daily. 90 tablet 3   benzonatate (TESSALON) 100 MG capsule Take 1 capsule (100 mg total) by mouth 3 (three) times daily as needed for cough. 20 capsule 0   docusate sodium (COLACE) 100 MG capsule Take 100 mg by mouth daily as needed for mild constipation.     fluticasone (FLONASE) 50 MCG/ACT nasal spray Place 1 spray into both nostrils daily as needed for rhinitis.     furosemide (LASIX) 20 MG tablet Take 1 tablet (20 mg) daily as needed if you gain more than 3 pounds in 1 day or 5 pounds in 1 week. 45 tablet 3   levothyroxine (SYNTHROID) 25 MCG tablet Take 1 tablet (25 mcg total) by mouth daily before  breakfast. Take on an empty stomach with water only.  Wait to consume other foods/ drinks/ meds for 30 minutes. 90 tablet 3   losartan (COZAAR) 25 MG tablet Take 1 tablet (25 mg total) by mouth daily. 90 tablet 1   metoprolol succinate (TOPROL-XL) 25 MG 24 hr tablet Take 1 tablet (25 mg total) by mouth daily. 90 tablet 1   Multiple Vitamin (MULTIVITAMIN) capsule Take 1 capsule by mouth daily.     polyvinyl alcohol (LIQUIFILM TEARS) 1.4 % ophthalmic solution Place 1 drop into both eyes as needed for dry eyes. 15 mL 0   No current facility-administered medications for this visit.    Physical Exam Vital signs BP 120/78 Heart rate 72 Respirations 20 SpO2 96% on room air  General: Very pleasant he is 77 year old gentleman in no acute distress. Heart: Regular rate and rhythm, no murmur Chest: Breath sounds are clear to auscultation Extremities: Mild LE edema.  Diagnostic Tests: Noncontrast chest CT was obtained on 03/22/2023, results are pending.  The images were reviewed and compared to CT scan from last year and showed minimal (possibly 1 mm) increase in diameter of the ascending aortic aneurysm.  Impression / Plan: Minimal if any change in the 4.5 to 4.6 cm thoracic aortic aneurysm.  This remains asymptomatic.  His blood pressure is well-controlled. We reviewed the importance of ongoing surveillance and risk medication.  Understands he should avoid repetitive strenuous activity or heavy lifting.  He is already quite focused on  keeping his blood pressure under control. Will follow-up on the radiologist results and plan for repeat non-contrast CT chest in 6 months prior to the next office visit.   Leary Roca, PA-C [facility] [phone]

## 2023-03-30 ENCOUNTER — Encounter: Payer: Self-pay | Admitting: Oncology

## 2023-03-30 LAB — BASIC METABOLIC PANEL
BUN/Creatinine Ratio: 17 (ref 10–24)
BUN: 25 mg/dL (ref 8–27)
CO2: 24 mmol/L (ref 20–29)
Calcium: 9.7 mg/dL (ref 8.6–10.2)
Chloride: 100 mmol/L (ref 96–106)
Creatinine, Ser: 1.43 mg/dL — ABNORMAL HIGH (ref 0.76–1.27)
Glucose: 70 mg/dL (ref 70–99)
Potassium: 4.8 mmol/L (ref 3.5–5.2)
Sodium: 138 mmol/L (ref 134–144)
eGFR: 51 mL/min/{1.73_m2} — ABNORMAL LOW (ref >59)

## 2023-03-31 NOTE — Telephone Encounter (Signed)
Disks and reports up front for patient to p/u - patient is aware

## 2023-04-01 ENCOUNTER — Ambulatory Visit (INDEPENDENT_AMBULATORY_CARE_PROVIDER_SITE_OTHER): Payer: Medicare HMO | Admitting: Physician Assistant

## 2023-04-01 VITALS — BP 120/72 | HR 72 | Resp 20 | Ht 66.0 in | Wt 216.0 lb

## 2023-04-01 DIAGNOSIS — I7121 Aneurysm of the ascending aorta, without rupture: Secondary | ICD-10-CM | POA: Diagnosis not present

## 2023-04-01 DIAGNOSIS — N1831 Chronic kidney disease, stage 3a: Secondary | ICD-10-CM | POA: Diagnosis not present

## 2023-04-01 DIAGNOSIS — G4733 Obstructive sleep apnea (adult) (pediatric): Secondary | ICD-10-CM | POA: Diagnosis not present

## 2023-04-01 DIAGNOSIS — Z905 Acquired absence of kidney: Secondary | ICD-10-CM | POA: Diagnosis not present

## 2023-04-01 DIAGNOSIS — I5042 Chronic combined systolic (congestive) and diastolic (congestive) heart failure: Secondary | ICD-10-CM | POA: Diagnosis not present

## 2023-04-01 NOTE — Patient Instructions (Signed)
Continue to keep a close watch on your blood pressure with a goal of less than 130/90.  Avoid strenuous activity like lifting more than 30 pounds.  Follow-up in 6 months with noncontrast CT chest

## 2023-04-05 ENCOUNTER — Ambulatory Visit (INDEPENDENT_AMBULATORY_CARE_PROVIDER_SITE_OTHER): Payer: Medicare HMO

## 2023-04-05 DIAGNOSIS — Z23 Encounter for immunization: Secondary | ICD-10-CM | POA: Diagnosis not present

## 2023-04-06 ENCOUNTER — Encounter: Payer: Self-pay | Admitting: Cardiovascular Disease
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
## 2023-04-06 NOTE — Progress Notes (Signed)
PERIOPERATIVE PRESCRIPTION FOR IMPLANTED CARDIAC DEVICE PROGRAMMING  Patient Information: Name:  Billy Rasmussen  DOB:  May 24, 1946  MRN:  [id number]    Planned Procedure:  Total Thyroidectomy  Surgeon:  Dr. [name]  Date of Procedure:  04/14/23  Cautery will be used.  Position during surgery:  Supine   Please send documentation back to:  [facility] (Fax # [phone])  Device Information:  Clinic EP Physician:  Dr. York Pellant    Device Type:  Defibrillator Manufacturer and Phone #:  St. Jude/Abbott: [phone] Pacemaker Dependent?:  No. Date of Last Device Check:  04/01/23 Normal Device Function?:  Yes.    Electrophysiologist's Recommendations:  Have magnet available. Provide continuous ECG monitoring when magnet is used or reprogramming is to be performed.  Procedure will likely interfere with device function.  Device should be programmed:  Tachy therapies disabled  Per Device Clinic Standing Orders, Lenor Coffin, RN  3:29 PM 04/06/2023

## 2023-04-06 NOTE — Pre-Procedure Instructions (Signed)
Surgical Instructions   Your procedure is scheduled on Wednesday, November 13th. Report to [facility] Main Entrance "A" at 6:30 A.M., then check in with the Admitting office. Any questions or running late day of surgery: call [phone]  Questions prior to your surgery date: call [phone], Monday-Friday, 8am-4pm. If you experience any cold or flu symptoms such as cough, fever, chills, shortness of breath, etc. between now and your scheduled surgery, please notify us at the above number.     Remember:  Do not eat after midnight the night before your surgery   You may drink clear liquids until 5:30 a.m. the morning of your surgery.   Clear liquids allowed are: Water, Non-Citrus Juices (without pulp), Carbonated Beverages, Clear Tea (no milk, honey, etc.), Black Coffee Only (NO MILK, CREAM OR POWDERED CREAMER of any kind), and Gatorade.    Take these medicines the morning of surgery with A SIP OF WATER  atorvastatin (LIPITOR)  fluticasone (FLONASE)  levothyroxine (SYNTHROID)  metoprolol succinate (TOPROL-XL)   May take these medicines IF NEEDED: acetaminophen (TYLENOL) or acetaminophen-codeine (TYLENOL #3)  Albuterol-Budesonide (AIRSUPRA)-Please bring all inhalers with you the day of surgery.  polyvinyl alcohol (LIQUIFILM TEARS)   One week prior to surgery, STOP taking any Aspirin (unless otherwise instructed by your surgeon) Aleve, Naproxen, Ibuprofen, Motrin, Advil, Goody's, BC's, all herbal medications, fish oil, and non-prescription vitamins.                     Do NOT Smoke (Tobacco/Vaping) for 24 hours prior to your procedure.  If you use a CPAP at night, you may bring your mask/headgear for your overnight stay.   You will be asked to remove any contacts, glasses, piercing's, hearing aid's, dentures/partials prior to surgery. Please bring cases for these items if needed.    Patients discharged the day of surgery will not be allowed to drive home, and someone needs to  stay with them for 24 hours.  SURGICAL WAITING ROOM VISITATION Patients may have no more than 2 support people in the waiting area - these visitors may rotate.   Pre-op nurse will coordinate an appropriate time for 1 ADULT support person, who may not rotate, to accompany patient in pre-op.  Children under the age of 31 must have an adult with them who is not the patient and must remain in the main waiting area with an adult.  If the patient needs to stay at the hospital during part of their recovery, the visitor guidelines for inpatient rooms apply.  Please refer to the [facility] website for the visitor guidelines for any additional information.   If you received a COVID test during your pre-op visit  it is requested that you wear a mask when out in public, stay away from anyone that may not be feeling well and notify your surgeon if you develop symptoms. If you have been in contact with anyone that has tested positive in the last 10 days please notify you surgeon.      Pre-operative CHG Bathing Instructions   You can play a key role in reducing the risk of infection after surgery. Your skin needs to be as free of germs as possible. You can reduce the number of germs on your skin by washing with CHG (chlorhexidine gluconate) soap before surgery. CHG is an antiseptic soap that kills germs and continues to kill germs even after washing.   DO NOT use if you have an allergy to chlorhexidine/CHG or antibacterial soaps. If  your skin becomes reddened or irritated, stop using the CHG and notify one of our RNs at [phone].              TAKE A SHOWER THE NIGHT BEFORE SURGERY AND THE DAY OF SURGERY    Please keep in mind the following:  DO NOT shave, including legs and underarms, 48 hours prior to surgery.   You may shave your face before/day of surgery.  Place clean sheets on your bed the night before surgery Use a clean washcloth (not used since being washed) for each shower. DO NOT sleep  with pet's night before surgery.  CHG Shower Instructions:  Wash your face and private area with normal soap. If you choose to wash your hair, wash first with your normal shampoo.  After you use shampoo/soap, rinse your hair and body thoroughly to remove shampoo/soap residue.  Turn the water OFF and apply half the bottle of CHG soap to a CLEAN washcloth.  Apply CHG soap ONLY FROM YOUR NECK DOWN TO YOUR TOES (washing for 3-5 minutes)  DO NOT use CHG soap on face, private areas, open wounds, or sores.  Pay special attention to the area where your surgery is being performed.  If you are having back surgery, having someone wash your back for you may be helpful. Wait 2 minutes after CHG soap is applied, then you may rinse off the CHG soap.  Pat dry with a clean towel  Put on clean pajamas    Additional instructions for the day of surgery: DO NOT APPLY any lotions, deodorants, cologne, or perfumes.   Do not wear jewelry or makeup Do not wear nail polish, gel polish, artificial nails, or any other type of covering on natural nails (fingers and toes) Do not bring valuables to the hospital. [facility] is not responsible for valuables/personal belongings. Put on clean/comfortable clothes.  Please brush your teeth.  Ask your nurse before applying any prescription medications to the skin.

## 2023-04-07 ENCOUNTER — Encounter (HOSPITAL_COMMUNITY): Payer: Self-pay

## 2023-04-07 ENCOUNTER — Other Ambulatory Visit: Payer: Self-pay

## 2023-04-07 ENCOUNTER — Encounter (HOSPITAL_COMMUNITY)
Admission: RE | Admit: 2023-04-07 | Discharge: 2023-04-07 | Disposition: A | Discharge status: Home / Self Care | Payer: Medicare HMO | Source: Ambulatory Visit | Attending: Otolaryngology | Admitting: Otolaryngology

## 2023-04-07 DIAGNOSIS — Z01812 Encounter for preprocedural laboratory examination: Secondary | ICD-10-CM | POA: Insufficient documentation

## 2023-04-07 HISTORY — DX: Presence of automatic (implantable) cardiac defibrillator: Z95.810

## 2023-04-07 NOTE — Pre-Procedure Instructions (Signed)
Surgical Instructions   Your procedure is scheduled on Wednesday, November 13th. Report to [facility] Main Entrance "A" at 6:30 A.M., then check in with the Admitting office. Any questions or running late day of surgery: call [phone]  Questions prior to your surgery date: call [phone], Monday-Friday, 8am-4pm. If you experience any cold or flu symptoms such as cough, fever, chills, shortness of breath, etc. between now and your scheduled surgery, please notify us at the above number.     Remember:  Do not eat or drink after midnight the night before your surgery   Take these medicines the morning of surgery with A SIP OF WATER  atorvastatin (LIPITOR)  fluticasone (FLONASE)  levothyroxine (SYNTHROID)  metoprolol succinate (TOPROL-XL)   May take these medicines IF NEEDED: acetaminophen (TYLENOL) or acetaminophen-codeine (TYLENOL #3)  Albuterol-Budesonide (AIRSUPRA)-Please bring all inhalers with you the day of surgery.  polyvinyl alcohol (LIQUIFILM TEARS)   One week prior to surgery, STOP taking any Aspirin (unless otherwise instructed by your surgeon) Aleve, Naproxen, Ibuprofen, Motrin, Advil, Goody's, BC's, all herbal medications, fish oil, and non-prescription vitamins.                     Do NOT Smoke (Tobacco/Vaping) for 24 hours prior to your procedure.  If you use a CPAP at night, you may bring your mask/headgear for your overnight stay.   You will be asked to remove any contacts, glasses, piercing's, hearing aid's, dentures/partials prior to surgery. Please bring cases for these items if needed.    Patients discharged the day of surgery will not be allowed to drive home, and someone needs to stay with them for 24 hours.  SURGICAL WAITING ROOM VISITATION Patients may have no more than 2 support people in the waiting area - these visitors may rotate.   Pre-op nurse will coordinate an appropriate time for 1 ADULT support person, who may not rotate, to accompany  patient in pre-op.  Children under the age of 25 must have an adult with them who is not the patient and must remain in the main waiting area with an adult.  If the patient needs to stay at the hospital during part of their recovery, the visitor guidelines for inpatient rooms apply.  Please refer to the [facility] website for the visitor guidelines for any additional information.   If you received a COVID test during your pre-op visit  it is requested that you wear a mask when out in public, stay away from anyone that may not be feeling well and notify your surgeon if you develop symptoms. If you have been in contact with anyone that has tested positive in the last 10 days please notify you surgeon.      Pre-operative CHG Bathing Instructions   You can play a key role in reducing the risk of infection after surgery. Your skin needs to be as free of germs as possible. You can reduce the number of germs on your skin by washing with CHG (chlorhexidine gluconate) soap before surgery. CHG is an antiseptic soap that kills germs and continues to kill germs even after washing.   DO NOT use if you have an allergy to chlorhexidine/CHG or antibacterial soaps. If your skin becomes reddened or irritated, stop using the CHG and notify one of our RNs at [phone].              TAKE A SHOWER THE NIGHT BEFORE SURGERY AND THE DAY OF SURGERY    Please  keep in mind the following:  DO NOT shave, including legs and underarms, 48 hours prior to surgery.   You may shave your face before/day of surgery.  Place clean sheets on your bed the night before surgery Use a clean washcloth (not used since being washed) for each shower. DO NOT sleep with pet's night before surgery.  CHG Shower Instructions:  Wash your face and private area with normal soap. If you choose to wash your hair, wash first with your normal shampoo.  After you use shampoo/soap, rinse your hair and body thoroughly to remove shampoo/soap  residue.  Turn the water OFF and apply half the bottle of CHG soap to a CLEAN washcloth.  Apply CHG soap ONLY FROM YOUR NECK DOWN TO YOUR TOES (washing for 3-5 minutes)  DO NOT use CHG soap on face, private areas, open wounds, or sores.  Pay special attention to the area where your surgery is being performed.  If you are having back surgery, having someone wash your back for you may be helpful. Wait 2 minutes after CHG soap is applied, then you may rinse off the CHG soap.  Pat dry with a clean towel  Put on clean pajamas    Additional instructions for the day of surgery: DO NOT APPLY any lotions, deodorants, cologne, or perfumes.   Do not wear jewelry or makeup Do not wear nail polish, gel polish, artificial nails, or any other type of covering on natural nails (fingers and toes) Do not bring valuables to the hospital. [facility] is not responsible for valuables/personal belongings. Put on clean/comfortable clothes.  Please brush your teeth.  Ask your nurse before applying any prescription medications to the skin.

## 2023-04-07 NOTE — Progress Notes (Signed)
PCP - Dr. Doylene Canard Cardiologist - Dr. Epifanio Lesches EP Cardiologist: Dr. York Pellant  PPM/ICD - ICD, Abbott/St. Jude Device Orders -  Received Rep Notified -  Yes, [place]  Chest x-ray - 03/16/2023 EKG - 03/18/2023 Stress Test -  ECHO - 01/25/2023 Cardiac Cath - 10/09/2022  Sleep Study - OSA CPAP - Wears nightly  Non-diabetic  Blood Thinner Instructions:denies Aspirin Instructions:denies  ERAS Protcol - NPO  COVID TEST- na   Anesthesia review: Yes. CHF, HTN, OSA, pacemaker  Patient denies shortness of breath, fever, cough and chest pain at PAT appointment   All instructions explained to the patient, with a verbal understanding of the material. Patient agrees to go over the instructions while at home for a better understanding. Patient also instructed to self quarantine after being tested for COVID-19. The opportunity to ask questions was provided.

## 2023-04-08 NOTE — Anesthesia Preprocedure Evaluation (Addendum)
Anesthesia Evaluation  Patient identified by MRN, date of birth, ID band Patient awake    Reviewed: Allergy & Precautions, NPO status , Patient's Chart, lab work & pertinent test results, reviewed documented beta blocker date and time   History of Anesthesia Complications (+) PONV  Airway Mallampati: I  TM Distance: >3 FB Neck ROM: Full    Dental  (+) Missing, Dental Advisory Given   Pulmonary asthma , sleep apnea and Continuous Positive Airway Pressure Ventilation , COPD,  COPD inhaler, former smoker   breath sounds clear to auscultation       Cardiovascular hypertension, Pt. on medications and Pt. on home beta blockers (-) angina + CAD (mild, non-obstructive) and +CHF  + pacemaker (BiV) + Cardiac Defibrillator  Rhythm:Regular Rate:Normal  12/2022 ECHO: EF <20%, severely decreased global LVF, Grade 2 DD,  mildly reduced RVF, mild-mod MR   Neuro/Psych negative neurological ROS     GI/Hepatic Neg liver ROS,GERD  Controlled,,  Endo/Other  Hypothyroidism  BMI 34.7  Renal/GU Renal InsufficiencyRenal disease   H/o bladder cancer    Musculoskeletal  (+) Arthritis ,    Abdominal   Peds  Hematology negative hematology ROS (+)   Anesthesia Other Findings   Reproductive/Obstetrics                             Anesthesia Physical Anesthesia Plan  ASA: 4  Anesthesia Plan: General   Post-op Pain Management: Tylenol PO (pre-op)* and Minimal or no pain anticipated   Induction: Intravenous  PONV Risk Score and Plan: 3 and Ondansetron and Dexamethasone  Airway Management Planned: Oral ETT  Additional Equipment: None  Intra-op Plan:   Post-operative Plan: Extubation in OR  Informed Consent: I have reviewed the patients History and Physical, chart, labs and discussed the procedure including the risks, benefits and alternatives for the proposed anesthesia with the patient or authorized  representative who has indicated his/her understanding and acceptance.     Dental advisory given  Plan Discussed with: CRNA and Surgeon  Anesthesia Plan Comments: (PAT note by Antionette Poles, PA-C: 77 year old male follows with cardiology for history of HTN, OSA on CPAP, chronic combined heart failure, ascending aortic aneurysm (45 mm on CTPA 08/2022).  Echo 10/17/2022 showed EF less than 20%, mildly reduced RV systolic dysfunction, RVSP 44, severe left atrial enlargement, mild MR, dilated ascending aorta measuring 45 mm.  RHC/LHC on 10/09/2022 showed nonobstructive CAD (30% RCA stenosis, RA 6 RV 50/3, PA 55/26/37 PCWP 27 CI 3.85.  Echocardiogram 12/2022 showed EF remains less than 20%.  He was referred to EP and underwent BiV ICD insertion with Dr. Nelly Laurence on 03/01/2023. Device interrogation 03/17/2023 showed 99.5% BiV pacing.  Seen by Dr. Bjorn Pippin 03/18/2023 for preop evaluation.  Per note, "Preop evaluation: Prior to thyroidectomy scheduled for 04/14/2023 for thyroid cancer. Currently denies exertional chest pain or dyspnea. He is at elevated risk for surgery given his heart failure, but currently appears compensated. No further cardiac workup recommended prior to surgery."  Other pertinent history includes bladder cancer s/p left nephrectomy, cystoprostatectomy, CKD 3A.  BMP 03/29/2023 reviewed, creatinine mildly elevated 1.43, otherwise WNL.  CBC 03/18/2023 reviewed, WNL.  Perioperative prescription for implanted cardiac device programming per progress note 04/06/2023: Device Information:  Clinic EP Physician:  Dr. York Pellant     Device Type:  Defibrillator Manufacturer and Phone #:  [employer] Jude/A[employer]ott: [phone] Pacemaker Dependent?:  No. Date of Last Device Check:  04/01/23  Normal Device Function?:  Yes.    Electrophysiologist's Recommendations:   Have magnet available.  Provide continuous ECG monitoring when magnet is used or reprogramming is to be performed.    Procedure will likely interfere with device function.  Device should be programmed:  Tachy therapies disabled  TTE 01/25/2023: 1. Left ventricular ejection fraction, by estimation, is <20%. The left  ventricle has severely decreased function. The left ventricle demonstrates  global hypokinesis. The left ventricular internal cavity size was mildly  dilated. Left ventricular  diastolic parameters are consistent with Grade II diastolic dysfunction  (pseudonormalization). Elevated left ventricular end-diastolic pressure.  The E/e' is 20. The average left ventricular global longitudinal strain is  -9.6 %. The global longitudinal  strain is abnormal.  2. Markedly abnormal RV free wall strain at -7.7%. Right ventricular  systolic function is mildly reduced. The right ventricular size is mildly  enlarged. There is normal pulmonary artery systolic pressure. The  estimated right ventricular systolic  pressure is 25.3 mmHg.  3. The mitral valve is abnormal. Mild to moderate mitral valve  regurgitation.  4. The aortic valve is tricuspid. Aortic valve regurgitation is not  visualized.  5. Aortic dilatation noted. There is mild dilatation of the ascending  aorta, measuring 44 mm. There is borderline dilatation of the aortic root,  measuring 39 mm.  6. The inferior vena cava is normal in size with greater than 50%  respiratory variability, suggesting right atrial pressure of 3 mmHg.   Comparison(s): No significant change from prior study. 10/07/2022: LVEF  <20%, global HK/septal dyssynchrony.   Cath 10/09/2022:   Prox RCA lesion is 30% stenosed.  1.  Mild diffuse coronary artery disease. 2.  Fick cardiac output of 7.89 L/min and Fick cardiac index of 3.85 L/min/m with the following hemodynamics:            Right atrial pressure mean of 6 mmHg            Right ventricular pressure 50/3 with a end-diastolic pressure of 6 mmHg            Wedge pressure mean of 27 mmHg            Pulmonary  artery pressure 55/26 with a mean of 37 mmHg            Pulmonary vascular resistance of 1.84 Woods units consistent with pulmonary venous hypertension            Pulmonary artery pulsatility index of 4.1 3.  LVEDP assessment was deferred due to the presence of left ventricular thrombus.  Recommendation: Medical therapy.    )        Anesthesia Quick Evaluation

## 2023-04-08 NOTE — Progress Notes (Signed)
Anesthesia Chart Review:  77 year old male follows with cardiology for history of HTN, OSA on CPAP, chronic combined heart failure, ascending aortic aneurysm (45 mm on CTPA 08/2022).  Echo 10/17/2022 showed EF less than 20%, mildly reduced RV systolic dysfunction, RVSP 44, severe left atrial enlargement, mild MR, dilated ascending aorta measuring 45 mm.  RHC/LHC on 10/09/2022 showed nonobstructive CAD (30% RCA stenosis, RA 6 RV 50/3, PA 55/26/37 PCWP 27 CI 3.85.  Echocardiogram 12/2022 showed EF remains less than 20%.  He was referred to EP and underwent BiV ICD insertion with Dr. Nelly Laurence on 03/01/2023. Device interrogation 03/17/2023 showed 99.5% BiV pacing.  Seen by Dr. Bjorn Pippin 03/18/2023 for preop evaluation.  Per note, "Preop evaluation: Prior to thyroidectomy scheduled for 04/14/2023 for thyroid cancer. Currently denies exertional chest pain or dyspnea. He is at elevated risk for surgery given his heart failure, but currently appears compensated. No further cardiac workup recommended prior to surgery."  Other pertinent history includes bladder cancer s/p left nephrectomy, cystoprostatectomy, CKD 3A.  BMP 03/29/2023 reviewed, creatinine mildly elevated 1.43, otherwise WNL.  CBC 03/18/2023 reviewed, WNL.  Perioperative prescription for implanted cardiac device programming per progress note 04/06/2023: Device Information:   Clinic EP Physician:  Dr. York Pellant      Device Type:  Defibrillator Manufacturer and Phone #:  [employer]ott: [phone] Pacemaker Dependent?:  No. Date of Last Device Check:  04/01/23         Normal Device Function?:  Yes.     Electrophysiologist's Recommendations:   Have magnet available. Provide continuous ECG monitoring when magnet is used or reprogramming is to be performed.  Procedure will likely interfere with device function.  Device should be programmed:  Tachy therapies disabled  TTE 01/25/2023: 1. Left ventricular ejection fraction, by estimation, is  <20%. The left  ventricle has severely decreased function. The left ventricle demonstrates  global hypokinesis. The left ventricular internal cavity size was mildly  dilated. Left ventricular  diastolic parameters are consistent with Grade II diastolic dysfunction  (pseudonormalization). Elevated left ventricular end-diastolic pressure.  The E/e' is 20. The average left ventricular global longitudinal strain is  -9.6 %. The global longitudinal  strain is abnormal.   2. Markedly abnormal RV free wall strain at -7.7%. Right ventricular  systolic function is mildly reduced. The right ventricular size is mildly  enlarged. There is normal pulmonary artery systolic pressure. The  estimated right ventricular systolic  pressure is 25.3 mmHg.   3. The mitral valve is abnormal. Mild to moderate mitral valve  regurgitation.   4. The aortic valve is tricuspid. Aortic valve regurgitation is not  visualized.   5. Aortic dilatation noted. There is mild dilatation of the ascending  aorta, measuring 44 mm. There is borderline dilatation of the aortic root,  measuring 39 mm.   6. The inferior vena cava is normal in size with greater than 50%  respiratory variability, suggesting right atrial pressure of 3 mmHg.   Comparison(s): No significant change from prior study. 10/07/2022: LVEF  <20%, global HK/septal dyssynchrony.   Cath 10/09/2022:   Prox RCA lesion is 30% stenosed.   1.  Mild diffuse coronary artery disease. 2.  Fick cardiac output of 7.89 L/min and Fick cardiac index of 3.85 L/min/m with the following hemodynamics:            Right atrial pressure mean of 6 mmHg            Right ventricular pressure 50/3 with a end-diastolic pressure of 6 mmHg  Wedge pressure mean of 27 mmHg            Pulmonary artery pressure 55/26 with a mean of 37 mmHg            Pulmonary vascular resistance of 1.84 Woods units consistent with pulmonary venous hypertension            Pulmonary artery  pulsatility index of 4.1 3.  LVEDP assessment was deferred due to the presence of left ventricular thrombus.   Recommendation: Medical therapy.       Zannie Cove [facility]/Anesthesiology Phone [phone] 04/08/2023 12:02 PM

## 2023-04-12 ENCOUNTER — Other Ambulatory Visit: Payer: Self-pay | Admitting: Otolaryngology

## 2023-04-12 ENCOUNTER — Ambulatory Visit: Payer: Self-pay | Admitting: Otolaryngology

## 2023-04-14 ENCOUNTER — Encounter (HOSPITAL_COMMUNITY)
Admission: RE | Disposition: A | Discharge status: Home / Self Care | Payer: Self-pay | Source: Home / Self Care | Attending: Otolaryngology

## 2023-04-14 ENCOUNTER — Observation Stay (HOSPITAL_COMMUNITY)
Admission: RE | Admit: 2023-04-14 | Discharge: 2023-04-15 | Disposition: A | Discharge status: Home / Self Care | Payer: Medicare HMO | Attending: Otolaryngology | Admitting: Otolaryngology

## 2023-04-14 ENCOUNTER — Encounter (HOSPITAL_COMMUNITY): Payer: Self-pay | Admitting: Otolaryngology

## 2023-04-14 ENCOUNTER — Ambulatory Visit (HOSPITAL_COMMUNITY): Payer: Medicare HMO | Admitting: Physician Assistant

## 2023-04-14 ENCOUNTER — Other Ambulatory Visit: Payer: Self-pay

## 2023-04-14 ENCOUNTER — Ambulatory Visit (HOSPITAL_BASED_OUTPATIENT_CLINIC_OR_DEPARTMENT_OTHER): Payer: Self-pay | Admitting: Certified Registered"

## 2023-04-14 DIAGNOSIS — E079 Disorder of thyroid, unspecified: Secondary | ICD-10-CM | POA: Diagnosis not present

## 2023-04-14 DIAGNOSIS — Z79899 Other long term (current) drug therapy: Secondary | ICD-10-CM | POA: Insufficient documentation

## 2023-04-14 DIAGNOSIS — E042 Nontoxic multinodular goiter: Secondary | ICD-10-CM | POA: Diagnosis not present

## 2023-04-14 DIAGNOSIS — Z8551 Personal history of malignant neoplasm of bladder: Secondary | ICD-10-CM | POA: Insufficient documentation

## 2023-04-14 DIAGNOSIS — E049 Nontoxic goiter, unspecified: Secondary | ICD-10-CM | POA: Diagnosis not present

## 2023-04-14 DIAGNOSIS — I5041 Acute combined systolic (congestive) and diastolic (congestive) heart failure: Secondary | ICD-10-CM | POA: Diagnosis not present

## 2023-04-14 DIAGNOSIS — E041 Nontoxic single thyroid nodule: Secondary | ICD-10-CM | POA: Diagnosis not present

## 2023-04-14 DIAGNOSIS — E890 Postprocedural hypothyroidism: Principal | ICD-10-CM

## 2023-04-14 DIAGNOSIS — Z9889 Other specified postprocedural states: Secondary | ICD-10-CM

## 2023-04-14 DIAGNOSIS — Z96653 Presence of artificial knee joint, bilateral: Secondary | ICD-10-CM | POA: Insufficient documentation

## 2023-04-14 DIAGNOSIS — I130 Hypertensive heart and chronic kidney disease with heart failure and stage 1 through stage 4 chronic kidney disease, or unspecified chronic kidney disease: Secondary | ICD-10-CM | POA: Diagnosis not present

## 2023-04-14 DIAGNOSIS — G4733 Obstructive sleep apnea (adult) (pediatric): Secondary | ICD-10-CM | POA: Diagnosis not present

## 2023-04-14 HISTORY — PX: THYROIDECTOMY: SHX17

## 2023-04-14 LAB — CALCIUM
Calcium: 9.1 mg/dL (ref 8.9–10.3)
Calcium: 9.1 mg/dL (ref 8.9–10.3)

## 2023-04-14 SURGERY — THYROIDECTOMY
Anesthesia: General | Site: Neck | Laterality: Bilateral

## 2023-04-14 MED ORDER — FENTANYL CITRATE (PF) 100 MCG/2ML IJ SOLN
25.0000 ug | INTRAMUSCULAR | Status: DC | PRN
Start: 2023-04-14 — End: 2023-04-14
  Administered 2023-04-14: 25 ug via INTRAVENOUS
  Administered 2023-04-14: 50 ug via INTRAVENOUS

## 2023-04-14 MED ORDER — LABETALOL HCL 5 MG/ML IV SOLN
INTRAVENOUS | Status: AC
  Filled 2023-04-14: qty 4

## 2023-04-14 MED ORDER — FENTANYL CITRATE (PF) 250 MCG/5ML IJ SOLN
INTRAMUSCULAR | Status: AC
  Filled 2023-04-14: qty 5

## 2023-04-14 MED ORDER — LEVOTHYROXINE SODIUM 100 MCG PO TABS
100.0000 ug | ORAL_TABLET | Freq: Every day | ORAL | Status: DC
  Administered 2023-04-15: 100 ug via ORAL
  Filled 2023-04-14: qty 1

## 2023-04-14 MED ORDER — POTASSIUM CHLORIDE 2 MEQ/ML IV SOLN
INTRAVENOUS | Status: DC
  Filled 2023-04-14 (×2): qty 1000

## 2023-04-14 MED ORDER — LACTATED RINGERS IV SOLN: INTRAVENOUS | Status: DC | PRN

## 2023-04-14 MED ORDER — ONDANSETRON HCL 4 MG/2ML IJ SOLN
INTRAMUSCULAR | Status: AC
  Filled 2023-04-14: qty 2

## 2023-04-14 MED ORDER — OXYCODONE HCL 5 MG PO TABS: 5.0000 mg | ORAL_TABLET | Freq: Once | ORAL | Status: DC | PRN

## 2023-04-14 MED ORDER — 0.9 % SODIUM CHLORIDE (POUR BTL) OPTIME
TOPICAL | Status: DC | PRN
  Administered 2023-04-14: 1000 mL

## 2023-04-14 MED ORDER — LOSARTAN POTASSIUM 25 MG PO TABS: 25.0000 mg | ORAL_TABLET | Freq: Every day | ORAL | Status: DC

## 2023-04-14 MED ORDER — SUCCINYLCHOLINE CHLORIDE 200 MG/10ML IV SOSY
PREFILLED_SYRINGE | INTRAVENOUS | Status: AC
  Filled 2023-04-14: qty 10

## 2023-04-14 MED ORDER — ONDANSETRON HCL 4 MG/2ML IJ SOLN: 4.0000 mg | INTRAMUSCULAR | Status: DC | PRN

## 2023-04-14 MED ORDER — ONDANSETRON HCL 4 MG/2ML IJ SOLN
INTRAMUSCULAR | Status: DC | PRN
  Administered 2023-04-14: 4 mg via INTRAVENOUS

## 2023-04-14 MED ORDER — ROCURONIUM BROMIDE 100 MG/10ML IV SOLN
INTRAVENOUS | Status: DC | PRN
  Administered 2023-04-14: 60 mg via INTRAVENOUS

## 2023-04-14 MED ORDER — ORAL CARE MOUTH RINSE: 15.0000 mL | Freq: Once | OROMUCOSAL | Status: AC

## 2023-04-14 MED ORDER — MULTIVITAMINS PO CAPS: 1.0000 | ORAL_CAPSULE | Freq: Every day | ORAL | Status: DC

## 2023-04-14 MED ORDER — LIDOCAINE-EPINEPHRINE 1 %-1:100000 IJ SOLN
INTRAMUSCULAR | Status: AC
  Filled 2023-04-14: qty 1

## 2023-04-14 MED ORDER — FUROSEMIDE 20 MG PO TABS
20.0000 mg | ORAL_TABLET | Freq: Every day | ORAL | Status: DC
  Administered 2023-04-14 – 2023-04-15 (×2): 20 mg via ORAL
  Filled 2023-04-14 (×2): qty 1

## 2023-04-14 MED ORDER — LABETALOL HCL 5 MG/ML IV SOLN
INTRAVENOUS | Status: DC | PRN
  Administered 2023-04-14 (×3): 5 mg via INTRAVENOUS

## 2023-04-14 MED ORDER — ACETAMINOPHEN 500 MG PO TABS
1000.0000 mg | ORAL_TABLET | Freq: Once | ORAL | Status: AC
  Administered 2023-04-14: 1000 mg via ORAL
  Filled 2023-04-14: qty 2

## 2023-04-14 MED ORDER — BUDESONIDE 0.25 MG/2ML IN SUSP: 0.2500 mg | Freq: Four times a day (QID) | RESPIRATORY_TRACT | Status: DC | PRN

## 2023-04-14 MED ORDER — FENTANYL CITRATE (PF) 100 MCG/2ML IJ SOLN
INTRAMUSCULAR | Status: AC
  Filled 2023-04-14: qty 2

## 2023-04-14 MED ORDER — ADULT MULTIVITAMIN W/MINERALS CH
1.0000 | ORAL_TABLET | Freq: Every day | ORAL | Status: DC
  Administered 2023-04-15: 1 via ORAL
  Filled 2023-04-14: qty 1

## 2023-04-14 MED ORDER — POLYVINYL ALCOHOL 1.4 % OP SOLN
1.0000 [drp] | OPHTHALMIC | Status: DC | PRN
  Filled 2023-04-14: qty 15

## 2023-04-14 MED ORDER — LIDOCAINE 2% (20 MG/ML) 5 ML SYRINGE
INTRAMUSCULAR | Status: AC
  Filled 2023-04-14: qty 5

## 2023-04-14 MED ORDER — HYDROCODONE-ACETAMINOPHEN 5-325 MG PO TABS
1.0000 | ORAL_TABLET | ORAL | Status: DC | PRN
  Administered 2023-04-14 – 2023-04-15 (×4): 2 via ORAL
  Filled 2023-04-14 (×4): qty 2

## 2023-04-14 MED ORDER — MIDAZOLAM HCL 2 MG/2ML IJ SOLN: 0.5000 mg | Freq: Once | INTRAMUSCULAR | Status: DC | PRN

## 2023-04-14 MED ORDER — CHLORHEXIDINE GLUCONATE 0.12 % MT SOLN
15.0000 mL | Freq: Once | OROMUCOSAL | Status: AC
  Administered 2023-04-14: 15 mL via OROMUCOSAL
  Filled 2023-04-14: qty 15

## 2023-04-14 MED ORDER — IPRATROPIUM-ALBUTEROL 0.5-2.5 (3) MG/3ML IN SOLN: 3.0000 mL | Freq: Four times a day (QID) | RESPIRATORY_TRACT | Status: DC | PRN

## 2023-04-14 MED ORDER — IBUPROFEN 100 MG/5ML PO SUSP: 400.0000 mg | Freq: Four times a day (QID) | ORAL | Status: DC | PRN

## 2023-04-14 MED ORDER — ALBUTEROL-BUDESONIDE 90-80 MCG/ACT IN AERO: 2.0000 | INHALATION_SPRAY | Freq: Four times a day (QID) | RESPIRATORY_TRACT | Status: DC | PRN

## 2023-04-14 MED ORDER — ACETAMINOPHEN 500 MG PO TABS: 500.0000 mg | ORAL_TABLET | Freq: Four times a day (QID) | ORAL | Status: DC | PRN

## 2023-04-14 MED ORDER — ATORVASTATIN CALCIUM 10 MG PO TABS
20.0000 mg | ORAL_TABLET | Freq: Every day | ORAL | Status: DC
  Administered 2023-04-14 – 2023-04-15 (×2): 20 mg via ORAL
  Filled 2023-04-14 (×2): qty 2

## 2023-04-14 MED ORDER — FLUTICASONE PROPIONATE 50 MCG/ACT NA SUSP
1.0000 | Freq: Every day | NASAL | Status: DC
Start: 2023-04-14 — End: 2023-04-15
  Administered 2023-04-14 – 2023-04-15 (×2): 1 via NASAL
  Filled 2023-04-14: qty 16

## 2023-04-14 MED ORDER — LOSARTAN POTASSIUM 25 MG PO TABS
25.0000 mg | ORAL_TABLET | Freq: Every day | ORAL | Status: DC
  Administered 2023-04-15: 25 mg via ORAL
  Filled 2023-04-14: qty 1

## 2023-04-14 MED ORDER — DEXAMETHASONE SODIUM PHOSPHATE 10 MG/ML IJ SOLN
INTRAMUSCULAR | Status: DC | PRN
  Administered 2023-04-14: 8 mg via INTRAVENOUS

## 2023-04-14 MED ORDER — DEXAMETHASONE SODIUM PHOSPHATE 10 MG/ML IJ SOLN
INTRAMUSCULAR | Status: AC
  Filled 2023-04-14: qty 1

## 2023-04-14 MED ORDER — ETOMIDATE 2 MG/ML IV SOLN
INTRAVENOUS | Status: AC
  Filled 2023-04-14: qty 10

## 2023-04-14 MED ORDER — PROPOFOL 10 MG/ML IV BOLUS
INTRAVENOUS | Status: DC | PRN
  Administered 2023-04-14: 150 mg via INTRAVENOUS

## 2023-04-14 MED ORDER — SUGAMMADEX SODIUM 200 MG/2ML IV SOLN
INTRAVENOUS | Status: DC | PRN
  Administered 2023-04-14: 200 mg via INTRAVENOUS

## 2023-04-14 MED ORDER — ONDANSETRON HCL 4 MG PO TABS
4.0000 mg | ORAL_TABLET | ORAL | Status: DC | PRN
  Administered 2023-04-14: 4 mg via ORAL
  Filled 2023-04-14: qty 1

## 2023-04-14 MED ORDER — PROPOFOL 10 MG/ML IV BOLUS
INTRAVENOUS | Status: AC
  Filled 2023-04-14: qty 20

## 2023-04-14 MED ORDER — FENTANYL CITRATE (PF) 100 MCG/2ML IJ SOLN
INTRAMUSCULAR | Status: DC | PRN
  Administered 2023-04-14: 50 ug via INTRAVENOUS
  Administered 2023-04-14: 150 ug via INTRAVENOUS
  Administered 2023-04-14: 50 ug via INTRAVENOUS

## 2023-04-14 MED ORDER — LIDOCAINE HCL (CARDIAC) PF 100 MG/5ML IV SOSY
PREFILLED_SYRINGE | INTRAVENOUS | Status: DC | PRN
  Administered 2023-04-14: 20 mg via INTRAVENOUS

## 2023-04-14 MED ORDER — METOPROLOL SUCCINATE ER 25 MG PO TB24
25.0000 mg | ORAL_TABLET | Freq: Every day | ORAL | Status: DC
  Administered 2023-04-15: 25 mg via ORAL
  Filled 2023-04-14: qty 1

## 2023-04-14 MED ORDER — OXYCODONE HCL 5 MG/5ML PO SOLN: 5.0000 mg | Freq: Once | ORAL | Status: DC | PRN

## 2023-04-14 SURGICAL SUPPLY — 41 items
BLADE SURG 15 STRL LF DISP TIS (BLADE) IMPLANT
BLADE SURG 15 STRL SS (BLADE) ×1
CANISTER SUCT 3000ML PPV (MISCELLANEOUS) ×1 IMPLANT
CLEANER TIP ELECTROSURG 2X2 (MISCELLANEOUS) ×1 IMPLANT
CNTNR URN SCR LID CUP LEK RST (MISCELLANEOUS) ×1 IMPLANT
CONT SPEC 4OZ STRL OR WHT (MISCELLANEOUS)
CORD BIPOLAR FORCEPS 12FT (ELECTRODE) ×1 IMPLANT
COVER SURGICAL LIGHT HANDLE (MISCELLANEOUS) ×1 IMPLANT
DERMABOND ADVANCED .7 DNX12 (GAUZE/BANDAGES/DRESSINGS) ×1 IMPLANT
DRAIN JACKSON RD 7FR 3/32 (WOUND CARE) IMPLANT
DRAIN JP 10F RND RADIO (DRAIN) IMPLANT
DRAPE HALF SHEET 40X57 (DRAPES) IMPLANT
ELECT COATED BLADE 2.86 ST (ELECTRODE) ×1 IMPLANT
ELECT REM PT RETURN 9FT ADLT (ELECTROSURGICAL) ×1
ELECT SOLID GEL RDN PRO-PADZ (MISCELLANEOUS) ×1
ELECTRODE REM PT RTRN 9FT ADLT (ELECTROSURGICAL) ×1 IMPLANT
ELECTRODE SOLI GEL RDN PROPADZ (MISCELLANEOUS) IMPLANT
EVACUATOR SILICONE 100CC (DRAIN) ×1 IMPLANT
FORCEPS BIPOLAR SPETZLER 8 1.0 (NEUROSURGERY SUPPLIES) ×1 IMPLANT
GAUZE 4X4 16PLY ~~LOC~~+RFID DBL (SPONGE) IMPLANT
GLOVE ECLIPSE 7.5 STRL STRAW (GLOVE) ×1 IMPLANT
GOWN STRL REUS W/ TWL LRG LVL3 (GOWN DISPOSABLE) ×2 IMPLANT
GOWN STRL REUS W/TWL LRG LVL3 (GOWN DISPOSABLE) ×2
KIT BASIN OR (CUSTOM PROCEDURE TRAY) ×1 IMPLANT
KIT TURNOVER KIT B (KITS) ×1 IMPLANT
NDL PRECISIONGLIDE 27X1.5 (NEEDLE) ×1 IMPLANT
NEEDLE PRECISIONGLIDE 27X1.5 (NEEDLE) ×1
NS IRRIG 1000ML POUR BTL (IV SOLUTION) ×1 IMPLANT
PAD ARMBOARD 7.5X6 YLW CONV (MISCELLANEOUS) ×2 IMPLANT
PENCIL FOOT CONTROL (ELECTRODE) ×1 IMPLANT
SHEARS HARMONIC 9CM CVD (BLADE) ×1 IMPLANT
STAPLER VISISTAT 35W (STAPLE) ×1 IMPLANT
SUT CHROMIC 3 0 PS 2 (SUTURE) ×1 IMPLANT
SUT CHROMIC 3 0 SH 27 (SUTURE) IMPLANT
SUT ETHILON 3 0 PS 1 (SUTURE) ×1 IMPLANT
SUT MNCRL AB 4-0 PS2 18 (SUTURE) IMPLANT
SUT SILK 0 SH 30 (SUTURE) IMPLANT
SUT SILK 3 0 REEL (SUTURE) ×1 IMPLANT
SUT SILK 4 0 REEL (SUTURE) ×1 IMPLANT
TOWEL GREEN STERILE FF (TOWEL DISPOSABLE) ×1 IMPLANT
TRAY ENT MC OR (CUSTOM PROCEDURE TRAY) ×1 IMPLANT

## 2023-04-14 NOTE — Transfer of Care (Signed)
Immediate Anesthesia Transfer of Care Note  Patient: Billy Rasmussen  Procedure(s) Performed: TOTAL THYROIDECTOMY (Bilateral: Neck)  Patient Location: PACU  Anesthesia Type:General  Level of Consciousness: drowsy  Airway & Oxygen Therapy: Patient Spontanous Breathing and Patient connected to face mask oxygen  Post-op Assessment: Report given to RN, Post -op Vital signs reviewed and stable, and Patient moving all extremities X 4  Post vital signs: Reviewed and stable  Last Vitals:  Vitals Value Taken Time  BP 162/106 04/14/23 1051  Temp    Pulse 100 04/14/23 1054  Resp 20 04/14/23 1052  SpO2 97 % 04/14/23 1054  Vitals shown include unfiled device data.  Last Pain:  Vitals:   04/14/23 0711  TempSrc: Oral  PainSc:       Patients Stated Pain Goal: 2 (04/14/23 0709)  Complications: No notable events documented.

## 2023-04-14 NOTE — Anesthesia Procedure Notes (Signed)
Procedure Name: Intubation Date/Time: 04/14/2023 9:03 AM  Performed by: Jamelle Rushing, CRNAPre-anesthesia Checklist: Patient identified, Timeout performed, Emergency Drugs available, Suction available and Patient being monitored Patient Re-evaluated:Patient Re-evaluated prior to induction Oxygen Delivery Method: Circle system utilized Preoxygenation: Pre-oxygenation with 100% oxygen Induction Type: IV induction Ventilation: Mask ventilation without difficulty Laryngoscope Size: Mac and 3 Grade View: Grade II Tube type: Oral Tube size: 8.0 mm Number of attempts: 1 Airway Equipment and Method: Stylet Placement Confirmation: ETT inserted through vocal cords under direct vision, positive ETCO2 and breath sounds checked- equal and bilateral Secured at: 22 cm Tube secured with: Tape Dental Injury: Teeth and Oropharynx as per pre-operative assessment

## 2023-04-14 NOTE — Progress Notes (Signed)
ENT Post Operative Note  Subjective: Patient seen and examined at bedside. Reports pain controlled with medication. Tolerating diet without difficulty. No perioral numbness or tingling.   Vitals:   04/14/23 1210 04/14/23 1611  BP: (!) 157/78 (!) 151/88  Pulse: 61 66  Resp: 18 16  Temp: (!) 97.5 F (36.4 C) 97.9 F (36.6 C)  SpO2: 98% 98%     OBJECTIVE  Gen: alert, cooperative, appropriate Head/ENT: EOMI, mucus membranes moist and pink, conjunctiva clear Midline neck incision C/D/I. Neck soft, with no evidence of seroma or hematoma. JP drain exiting midline neck with sanguinous drainage. Respiratory: Voice without dysphonia. Non-labored breathing, no accessory muscle use, good O2 saturations on room air Neuro: CN II-XII grossly intact  ASSESS/ PLAN  Billy Rasmussen is a 77 y.o. male who is POD 0 from Total thyroidectomy.  -Continue observation -Continue JP drain to bulb suction. Empty, record output, and recharge every 6 hours. -Encourage IS use, ambulation to tolerance with assist -Pain control -Follow calcium, first WNL  Thank you for allowing me to participate in the care of this patient. Please do not hesitate to contact me with any questions or concerns.   Laren Boom, DO Otolaryngology [facility] ENT Cell: [phone]

## 2023-04-14 NOTE — Interval H&P Note (Signed)
History and Physical Interval Note:  04/14/2023 8:20 AM  Billy Rasmussen  has presented today for surgery, with the diagnosis of Multiple thyroid nodules.  The various methods of treatment have been discussed with the patient and family. After consideration of risks, benefits and other options for treatment, the patient has consented to  Procedure(s): TOTAL THYROIDECTOMY (Bilateral) as a surgical intervention.  The patient's history has been reviewed, patient examined, no change in status, stable for surgery.  I have reviewed the patient's chart and labs.  Questions were answered to the patient's satisfaction.     [name]

## 2023-04-14 NOTE — Progress Notes (Signed)
Pt arrived to 6 north room 2 alert and oriented x4. JP drain attached to neck in charge position. Bed in lowest position. Call light in reach. Will continue to monitor pt.

## 2023-04-14 NOTE — Op Note (Signed)
OPERATIVE REPORT  DATE OF SURGERY: 04/14/2023  PATIENT:  Billy Rasmussen,  77 y.o. male  PRE-OPERATIVE DIAGNOSIS:  Multiple thyroid nodules, suspicious molecular cytology  POST-OPERATIVE DIAGNOSIS:  Multiple thyroid nodules, suspicious molecular cytology  PROCEDURE:  Procedure(s): TOTAL THYROIDECTOMY  SURGEON:  Susy Frizzle, MD  ASSISTANTS: RNFA  ANESTHESIA:   General   EBL: 100 ml  DRAINS: 10 French round JP  LOCAL MEDICATIONS USED:  None  SPECIMEN: Total thyroidectomy, suture marks left lobe  COUNTS:  Correct  PROCEDURE DETAILS: The patient was taken to the operating room and placed on the operating table in the supine position. A shoulder roll was placed beneath the shoulder blades and the neck was extended. The neck was prepped and draped in a standard fashion. A low collar transverse incision was outlined with a marking pen and was incised with electrocautery. Dissection was continued down through the platysma layer.  Self-retaining retractors were used throughout the case.  The midline fascia was divided.  The left lobe was dissected first.  The lobe was diffusely enlarged and soft somewhat friable.  The superior pole was brought down and the superior vasculature was separately identified and ligated using the harmonic dissector.  Dissection continued to the middle thyroid vein and then ultimately to the inferior vasculature.  The recurrent nerve was identified and preserved.  There is ligament was divided as the gland was taken off of the trachea.  There is a very firm mass slightly adherent to the trachea anteriorly in the isthmus.  This was dissected off with a clean plane.  The right side was then dissected in a similar fashion.  The right side contained multiple firm nodules.  The superior vasculature was separately identified and ligated with the harmonic dissector.  The recurrent nerve was identified and preserved.  A suspected right superior parathyroid was identified  and preserved with its blood supply.  Middle thyroid vein and inferior vasculature were separately ligated using the harmonic dissector as the gland was brought off the thyroid and delivered after marking the left lobe with a suture.  Palpation of the pretracheal region did not reveal any palpable adenopathy.  Further dissection was not accomplished.  Hemostasis was completed using bipolar cautery and 4-0 silk ties as needed.  10 [place] round JP drain was exited through a separate stab incision at the midline.  The drain was secured in place with a nylon suture. The midline fascia was reapproximated with interrupted chromic suture. The platysma layer was also reapproximated with interrupted chromic suture. A running subcuticular closure was accomplished using 4-0 Monocryl. Dermabond was used on the skin. The drain was charged. The patient was awakened, extubated and transferred to recovery in stable condition.   PATIENT DISPOSITION:  To PACU, stable

## 2023-04-14 NOTE — Progress Notes (Signed)
Lunch tray ordered 

## 2023-04-14 NOTE — Progress Notes (Signed)
Pain med given per request

## 2023-04-14 NOTE — Anesthesia Postprocedure Evaluation (Signed)
Anesthesia Post Note  Patient: Billy Rasmussen  Procedure(s) Performed: TOTAL THYROIDECTOMY (Bilateral: Neck)     Patient location during evaluation: PACU Anesthesia Type: General Level of consciousness: awake and alert, patient cooperative and oriented Pain management: pain level controlled Vital Signs Assessment: post-procedure vital signs reviewed and stable Respiratory status: spontaneous breathing, nonlabored ventilation, respiratory function stable and patient connected to nasal cannula oxygen Cardiovascular status: blood pressure returned to baseline and stable Postop Assessment: no apparent nausea or vomiting Anesthetic complications: no Comments: AICD/pacer returned to preop function by device rep   No notable events documented.  Last Vitals:  Vitals:   04/14/23 1130 04/14/23 1210  BP: (!) 151/69 (!) 157/78  Pulse: 60 61  Resp: 12 18  Temp: 36.8 C (!) 36.4 C  SpO2: 99% 98%    Last Pain:  Vitals:   04/14/23 1313  TempSrc:   PainSc: 3                  Iridessa Harrow,E. Kathleena Freeman

## 2023-04-15 ENCOUNTER — Encounter (HOSPITAL_COMMUNITY): Payer: Self-pay | Admitting: Otolaryngology

## 2023-04-15 DIAGNOSIS — E079 Disorder of thyroid, unspecified: Secondary | ICD-10-CM | POA: Diagnosis not present

## 2023-04-15 DIAGNOSIS — E041 Nontoxic single thyroid nodule: Secondary | ICD-10-CM | POA: Diagnosis not present

## 2023-04-15 DIAGNOSIS — Z96653 Presence of artificial knee joint, bilateral: Secondary | ICD-10-CM | POA: Diagnosis not present

## 2023-04-15 DIAGNOSIS — Z8551 Personal history of malignant neoplasm of bladder: Secondary | ICD-10-CM | POA: Diagnosis not present

## 2023-04-15 DIAGNOSIS — Z79899 Other long term (current) drug therapy: Secondary | ICD-10-CM | POA: Diagnosis not present

## 2023-04-15 LAB — CALCIUM: Calcium: 9.1 mg/dL (ref 8.9–10.3)

## 2023-04-15 MED ORDER — LEVOTHYROXINE SODIUM 100 MCG PO TABS: 100.0000 ug | ORAL_TABLET | Freq: Every day | ORAL | 6 refills | Status: DC

## 2023-04-15 NOTE — Plan of Care (Signed)
  Problem: Education: Goal: Knowledge of General Education information will improve Description: Including pain rating scale, medication(s)/side effects and non-pharmacologic comfort measures Outcome: Progressing   Problem: Health Behavior/Discharge Planning: Goal: Ability to manage health-related needs will improve Outcome: Progressing   Problem: Clinical Measurements: Goal: Ability to maintain clinical measurements within normal limits will improve Outcome: Progressing Goal: Will remain free from infection Outcome: Progressing Goal: Diagnostic test results will improve Outcome: Progressing Goal: Respiratory complications will improve Outcome: Progressing Goal: Cardiovascular complication will be avoided Outcome: Progressing   Problem: Activity: Goal: Risk for activity intolerance will decrease Outcome: Progressing   Problem: Nutrition: Goal: Adequate nutrition will be maintained Outcome: Progressing   Problem: Coping: Goal: Level of anxiety will decrease Outcome: Progressing   Problem: Elimination: Goal: Will not experience complications related to bowel motility Outcome: Progressing Goal: Will not experience complications related to urinary retention Outcome: Progressing   Problem: Pain Management: Goal: General experience of comfort will improve Outcome: Progressing   Problem: Safety: Goal: Ability to remain free from injury will improve Outcome: Progressing   Problem: Skin Integrity: Goal: Risk for impaired skin integrity will decrease Outcome: Progressing   Problem: Education: Goal: Knowledge of the prescribed therapeutic regimen will improve Outcome: Progressing   Problem: Activity: Goal: Ability to tolerate increased activity will improve Outcome: Progressing   Problem: Health Behavior/Discharge Planning: Goal: Identification of resources available to assist in meeting health care needs will improve Outcome: Progressing   Problem:  Nutrition: Goal: Maintenance of adequate nutrition will improve Outcome: Progressing   Problem: Clinical Measurements: Goal: Complications related to the disease process, condition or treatment will be avoided or minimized Outcome: Progressing   Problem: Respiratory: Goal: Will regain and/or maintain adequate ventilation Outcome: Progressing   Problem: Skin Integrity: Goal: Demonstration of wound healing without infection will improve Outcome: Progressing

## 2023-04-15 NOTE — Plan of Care (Signed)
  Problem: Education: Goal: Knowledge of General Education information will improve Description: Including pain rating scale, medication(s)/side effects and non-pharmacologic comfort measures Outcome: Adequate for Discharge   Problem: Health Behavior/Discharge Planning: Goal: Ability to manage health-related needs will improve Outcome: Adequate for Discharge   Problem: Clinical Measurements: Goal: Ability to maintain clinical measurements within normal limits will improve Outcome: Adequate for Discharge Goal: Will remain free from infection Outcome: Adequate for Discharge Goal: Diagnostic test results will improve Outcome: Adequate for Discharge Goal: Respiratory complications will improve Outcome: Adequate for Discharge Goal: Cardiovascular complication will be avoided Outcome: Adequate for Discharge   Problem: Activity: Goal: Risk for activity intolerance will decrease Outcome: Adequate for Discharge   Problem: Nutrition: Goal: Adequate nutrition will be maintained Outcome: Adequate for Discharge   Problem: Coping: Goal: Level of anxiety will decrease Outcome: Adequate for Discharge   Problem: Elimination: Goal: Will not experience complications related to bowel motility Outcome: Adequate for Discharge Goal: Will not experience complications related to urinary retention Outcome: Adequate for Discharge   Problem: Pain Management: Goal: General experience of comfort will improve Outcome: Adequate for Discharge   Problem: Safety: Goal: Ability to remain free from injury will improve Outcome: Adequate for Discharge   Problem: Skin Integrity: Goal: Risk for impaired skin integrity will decrease Outcome: Adequate for Discharge   Problem: Education: Goal: Knowledge of the prescribed therapeutic regimen will improve Outcome: Adequate for Discharge   Problem: Activity: Goal: Ability to tolerate increased activity will improve Outcome: Adequate for Discharge    Problem: Health Behavior/Discharge Planning: Goal: Identification of resources available to assist in meeting health care needs will improve Outcome: Adequate for Discharge   Problem: Nutrition: Goal: Maintenance of adequate nutrition will improve Outcome: Adequate for Discharge   Problem: Clinical Measurements: Goal: Complications related to the disease process, condition or treatment will be avoided or minimized Outcome: Adequate for Discharge   Problem: Respiratory: Goal: Will regain and/or maintain adequate ventilation Outcome: Adequate for Discharge   Problem: Skin Integrity: Goal: Demonstration of wound healing without infection will improve Outcome: Adequate for Discharge

## 2023-04-15 NOTE — Progress Notes (Signed)
Subjective: Doing great, no complaints.  Mild sore throat.  Objective: Vital signs in last 24 hours: Temp:  [97.5 F (36.4 C)-98.2 F (36.8 C)] 98.2 F (36.8 C) (11/14 0758) Pulse Rate:  [60-100] 81 (11/14 0758) Resp:  [12-24] 18 (11/14 0758) BP: (141-162)/(65-106) 141/65 (11/14 0758) SpO2:  [97 %-100 %] 97 % (11/14 0758) Weight change:     Intake/Output from previous day: 11/13 0701 - 11/14 0700 In: 1503.1 [I.V.:1503.1] Out: 2070 [Urine:1600; Drains:450; Blood:20] Intake/Output this shift: Total I/O In: -  Out: 510 [Urine:500; Drains:10]  PHYSICAL EXAM: Awake and alert.  Neck looks excellent.  Incision intact.  No swelling.  Drain is functioning.  Voice is normal.  Lab Results: No results for input(s): "WBC", "HGB", "HCT", "PLT" in the last 72 hours. BMET Recent Labs    04/14/23 1215 04/14/23 1941  CALCIUM 9.1 9.1    Studies/Results: No results found.  Medications: I have reviewed the patient's current medications.  Assessment/Plan: Stable postop.  Discharge home today.  Follow-up tomorrow in office for drain removal.  LOS: 0 days      [name] 04/15/2023, 8:45 AM

## 2023-04-15 NOTE — Discharge Summary (Signed)
Physician Discharge Summary  Patient ID: Billy Rasmussen MRN: [id number] DOB/AGE: April 28, 1946 77 y.o.  Admit date: 04/14/2023 Discharge date: 04/15/2023  Admission Diagnoses: Thyroid mass  Discharge Diagnoses:  Principal Problem:   S/P total thyroidectomy   Discharged Condition: good  Hospital Course: No complications  Consults: none  Significant Diagnostic Studies: none  Treatments: surgery: Total thyroidectomy  Discharge Exam: Blood pressure (!) 141/65, pulse 81, temperature 98.2 F (36.8 C), temperature source Oral, resp. rate 18, height 5\' 6"  (1.676 m), weight 98 kg, SpO2 97%. PHYSICAL EXAM: Awake and alert, normal voice.  Drain in place.  Incision looks excellent.  Disposition: Discharge disposition: 01-Home or Self Care       Discharge Instructions     Diet - low sodium heart healthy   Complete by: As directed    Discharge wound care:   Complete by: As directed    Empty drain, recharge 3 times daily.   Increase activity slowly   Complete by: As directed       Allergies as of 04/15/2023   No Known Allergies      Medication List     TAKE these medications    acetaminophen 650 MG CR tablet Commonly known as: TYLENOL Take 1,300 mg by mouth daily as needed for pain.   acetaminophen-codeine 300-30 MG tablet Commonly known as: TYLENOL #3 Take 1 tablet by mouth every 8 (eight) hours as needed for moderate pain.   Airsupra 90-80 MCG/ACT Aero Generic drug: Albuterol-Budesonide Inhale 2 puffs into the lungs every 6 (six) hours as needed (shortness of breath/ wheeze).   atorvastatin 20 MG tablet Commonly known as: LIPITOR Take 1 tablet (20 mg total) by mouth daily.   fluticasone 50 MCG/ACT nasal spray Commonly known as: FLONASE Place 1 spray into both nostrils daily.   furosemide 20 MG tablet Commonly known as: LASIX Take 1 tablet (20 mg) daily as needed if you gain more than 3 pounds in 1 day or 5 pounds in 1 week.   levothyroxine 25 MCG  tablet Commonly known as: SYNTHROID Take 1 tablet (25 mcg total) by mouth daily before breakfast. Take on an empty stomach with water only.  Wait to consume other foods/ drinks/ meds for 30 minutes. What changed: Another medication with the same name was added. Make sure you understand how and when to take each.   levothyroxine 100 MCG tablet Commonly known as: Synthroid Take 1 tablet (100 mcg total) by mouth daily. What changed: You were already taking a medication with the same name, and this prescription was added. Make sure you understand how and when to take each.   losartan 25 MG tablet Commonly known as: COZAAR Take 1 tablet (25 mg total) by mouth daily.   metoprolol succinate 25 MG 24 hr tablet Commonly known as: TOPROL-XL Take 1 tablet (25 mg total) by mouth daily.   multivitamin capsule Take 1 capsule by mouth daily.   polyvinyl alcohol 1.4 % ophthalmic solution Commonly known as: LIQUIFILM TEARS Place 1 drop into both eyes as needed for dry eyes.               Discharge Care Instructions  (From admission, onward)           Start     Ordered   04/15/23 0000  Discharge wound care:       Comments: Empty drain, recharge 3 times daily.   04/15/23 0847            Follow-up Information  [name], MD Follow up on 04/16/2023.   Specialty: Otolaryngology Why: Come to the office at 1 PM for drain removal. Contact information: [address] [place] [address] [phone]                 Signed: [name] 04/15/2023, 8:49 AM

## 2023-04-15 NOTE — Discharge Instructions (Addendum)
Keep the neck clean and dry until the drain is removed.  Use pain medicine that you have at home if needed.  Start the new dose of thyroid medication.  You may take 4 each day of your old medicine until they are done and then start the new dose.

## 2023-04-16 DIAGNOSIS — C73 Malignant neoplasm of thyroid gland: Secondary | ICD-10-CM | POA: Diagnosis not present

## 2023-04-16 LAB — SURGICAL PATHOLOGY

## 2023-04-26 DIAGNOSIS — E039 Hypothyroidism, unspecified: Secondary | ICD-10-CM | POA: Diagnosis not present

## 2023-04-26 DIAGNOSIS — C73 Malignant neoplasm of thyroid gland: Secondary | ICD-10-CM | POA: Diagnosis not present

## 2023-04-28 ENCOUNTER — Other Ambulatory Visit (HOSPITAL_COMMUNITY): Payer: Self-pay | Admitting: Nurse Practitioner

## 2023-04-28 ENCOUNTER — Encounter (HOSPITAL_COMMUNITY): Payer: Self-pay | Admitting: Nurse Practitioner

## 2023-04-28 ENCOUNTER — Telehealth: Payer: Self-pay | Admitting: Neurology

## 2023-04-28 DIAGNOSIS — G4733 Obstructive sleep apnea (adult) (pediatric): Secondary | ICD-10-CM

## 2023-04-28 DIAGNOSIS — C73 Malignant neoplasm of thyroid gland: Secondary | ICD-10-CM

## 2023-04-28 NOTE — Telephone Encounter (Signed)
New, Billy Rasmussen, Otilio Jefferson, RN; Alain Honey; Jeris Penta, [place]; 1 other Received, Thank you!

## 2023-04-28 NOTE — Telephone Encounter (Signed)
Pt is asking for CPAP supplies: face mask, size large. Head straps and heated tube

## 2023-04-28 NOTE — Telephone Encounter (Signed)
Order sent to [facility].

## 2023-05-03 ENCOUNTER — Telehealth (HOSPITAL_COMMUNITY): Payer: Self-pay | Admitting: *Deleted

## 2023-05-03 NOTE — Telephone Encounter (Signed)
Error

## 2023-05-04 ENCOUNTER — Other Ambulatory Visit (HOSPITAL_COMMUNITY): Payer: Self-pay | Admitting: Nurse Practitioner

## 2023-05-04 ENCOUNTER — Telehealth (HOSPITAL_COMMUNITY): Payer: Self-pay | Admitting: Emergency Medicine

## 2023-05-04 DIAGNOSIS — C73 Malignant neoplasm of thyroid gland: Secondary | ICD-10-CM

## 2023-05-04 NOTE — Addendum Note (Signed)
Addended by: Bertram Savin on: 05/04/2023 08:24 AM   Modules accepted: Orders

## 2023-05-04 NOTE — Telephone Encounter (Signed)
New, Jerl Mina, RN; Santina Evans; Jeris Penta, Melissa; 1 other  Hello, We need the order updated to say Heated Humidity with all supplies listed separately or ALL SUPPLIES AS NEEDED listed on the order.  This is required by insurance on all new orders. Thank you, [name] New  ------  Order rewritten and sent to [facility].

## 2023-05-04 NOTE — Telephone Encounter (Signed)
Reaching out to patient to offer assistance regarding upcoming cardiac imaging study; pt verbalizes understanding of appt date/time, parking situation and where to check in, pre-test NPO status and medications ordered, and verified current allergies; name and call back number provided for further questions should they arise Cayne Yom RN Navigator Cardiac Imaging [facility] [phone] office [phone] cell 

## 2023-05-04 NOTE — Written Directive (Addendum)
 MOLECULAR IMAGING AND THERAPEUTICS WRITTEN DIRECTIVE   PATIENT NAME: Billy Rasmussen  PT DOB:   02-19-1946                                              MRN: [id number]  ---------------------------------------------------------------------------------------------------------------------   I-131 THYROID  CANCER THERAPY   RADIOPHARMACEUTICAL:  Iodine -131 Capsule    PRESCRIBED DOSE FOR ADMINISTRATION: 150 mCi   ROUTE OFADMINISTRATION:  PO   DIAGNOSIS: Papillary Thyroid  Carcinoma    REFERRING PHYSICIAN: Meagan Younts,NP   THYROGEN  STIMULATION OR HORMONE WITHDRAW: Thyrogen    REMNANT ABLATION OR ADJUVANT THERAPY: Remnant Ablation    DATE OF THYROIDECTOMY: Nov 13,2024   SURGEON: Dr. Jesus   TSH:   Lab Results  Component Value Date   TSH 2.090 09/16/2022   TSH 4.620 (H) 08/21/2022   TSH 3.240 09/19/2021     PRIOR I-131 THERAPY (Date and Dose):   Pathology:  Cell type: [x]   Papillary  [x]   Follicular  []   [place]   Largest tumor focus:  2.2    cm (multifocal)  Extrathyroidal Extension?     Yes [x]   No []     Lymphovascular Invasion?  Yes  []   No  [x]     Margins positive ? Yes [x]   No []     Lymph nodes positive? Yes []   No  []       # positive nodes:   # negative nodes:     TNM staging: pT:  2       PN:         Mx:    ADDITIONAL PHYSICIAN COMMENTS/NOTES Intermediate to high risk PTC.   Remnant ablation and adjuvant therapy  AUTHORIZED USER SIGNATURE & TIME STAMP:

## 2023-05-05 ENCOUNTER — Ambulatory Visit (HOSPITAL_COMMUNITY)
Admission: RE | Admit: 2023-05-05 | Discharge: 2023-05-05 | Disposition: A | Discharge status: Home / Self Care | Payer: Medicare HMO | Source: Ambulatory Visit | Attending: Cardiology | Admitting: Cardiology

## 2023-05-05 ENCOUNTER — Other Ambulatory Visit: Payer: Self-pay | Admitting: Cardiology

## 2023-05-05 DIAGNOSIS — I5042 Chronic combined systolic (congestive) and diastolic (congestive) heart failure: Secondary | ICD-10-CM | POA: Insufficient documentation

## 2023-05-05 DIAGNOSIS — I10 Essential (primary) hypertension: Secondary | ICD-10-CM | POA: Insufficient documentation

## 2023-05-05 MED ORDER — GADOBUTROL 1 MMOL/ML IV SOLN
10.0000 mL | Freq: Once | INTRAVENOUS | Status: AC | PRN
Start: 2023-05-05 — End: 2023-05-05
  Administered 2023-05-05: 10 mL via INTRAVENOUS

## 2023-05-06 DIAGNOSIS — C73 Malignant neoplasm of thyroid gland: Secondary | ICD-10-CM | POA: Diagnosis not present

## 2023-05-13 ENCOUNTER — Observation Stay (HOSPITAL_COMMUNITY)
Admission: EM | Admit: 2023-05-13 | Discharge: 2023-05-15 | Disposition: A | Discharge status: Home / Self Care | Payer: Medicare HMO | Attending: Internal Medicine | Admitting: Internal Medicine

## 2023-05-13 ENCOUNTER — Other Ambulatory Visit: Payer: Self-pay

## 2023-05-13 ENCOUNTER — Emergency Department (HOSPITAL_COMMUNITY): Payer: Medicare HMO

## 2023-05-13 ENCOUNTER — Encounter (HOSPITAL_COMMUNITY): Payer: Self-pay | Admitting: Emergency Medicine

## 2023-05-13 DIAGNOSIS — I517 Cardiomegaly: Secondary | ICD-10-CM | POA: Diagnosis not present

## 2023-05-13 DIAGNOSIS — I10 Essential (primary) hypertension: Secondary | ICD-10-CM | POA: Diagnosis present

## 2023-05-13 DIAGNOSIS — Z79899 Other long term (current) drug therapy: Secondary | ICD-10-CM | POA: Diagnosis not present

## 2023-05-13 DIAGNOSIS — I7121 Aneurysm of the ascending aorta, without rupture: Secondary | ICD-10-CM | POA: Diagnosis not present

## 2023-05-13 DIAGNOSIS — I428 Other cardiomyopathies: Secondary | ICD-10-CM

## 2023-05-13 DIAGNOSIS — Z95810 Presence of automatic (implantable) cardiac defibrillator: Secondary | ICD-10-CM | POA: Insufficient documentation

## 2023-05-13 DIAGNOSIS — G4733 Obstructive sleep apnea (adult) (pediatric): Secondary | ICD-10-CM | POA: Diagnosis not present

## 2023-05-13 DIAGNOSIS — Z96612 Presence of left artificial shoulder joint: Secondary | ICD-10-CM | POA: Insufficient documentation

## 2023-05-13 DIAGNOSIS — Z96653 Presence of artificial knee joint, bilateral: Secondary | ICD-10-CM | POA: Insufficient documentation

## 2023-05-13 DIAGNOSIS — J90 Pleural effusion, not elsewhere classified: Secondary | ICD-10-CM | POA: Diagnosis not present

## 2023-05-13 DIAGNOSIS — C679 Malignant neoplasm of bladder, unspecified: Secondary | ICD-10-CM | POA: Diagnosis present

## 2023-05-13 DIAGNOSIS — I300 Acute nonspecific idiopathic pericarditis: Secondary | ICD-10-CM | POA: Diagnosis not present

## 2023-05-13 DIAGNOSIS — I319 Disease of pericardium, unspecified: Principal | ICD-10-CM

## 2023-05-13 DIAGNOSIS — I5042 Chronic combined systolic (congestive) and diastolic (congestive) heart failure: Secondary | ICD-10-CM | POA: Insufficient documentation

## 2023-05-13 DIAGNOSIS — Z85850 Personal history of malignant neoplasm of thyroid: Secondary | ICD-10-CM | POA: Diagnosis not present

## 2023-05-13 DIAGNOSIS — R059 Cough, unspecified: Secondary | ICD-10-CM | POA: Diagnosis not present

## 2023-05-13 DIAGNOSIS — I776 Arteritis, unspecified: Secondary | ICD-10-CM | POA: Diagnosis not present

## 2023-05-13 DIAGNOSIS — Z96611 Presence of right artificial shoulder joint: Secondary | ICD-10-CM | POA: Diagnosis not present

## 2023-05-13 DIAGNOSIS — I5020 Unspecified systolic (congestive) heart failure: Secondary | ICD-10-CM | POA: Diagnosis not present

## 2023-05-13 DIAGNOSIS — E039 Hypothyroidism, unspecified: Secondary | ICD-10-CM | POA: Diagnosis present

## 2023-05-13 DIAGNOSIS — I110 Hypertensive heart disease with heart failure: Secondary | ICD-10-CM | POA: Diagnosis not present

## 2023-05-13 DIAGNOSIS — R0602 Shortness of breath: Secondary | ICD-10-CM | POA: Diagnosis not present

## 2023-05-13 DIAGNOSIS — I5022 Chronic systolic (congestive) heart failure: Secondary | ICD-10-CM | POA: Diagnosis not present

## 2023-05-13 DIAGNOSIS — I309 Acute pericarditis, unspecified: Secondary | ICD-10-CM

## 2023-05-13 DIAGNOSIS — Z87891 Personal history of nicotine dependence: Secondary | ICD-10-CM | POA: Diagnosis not present

## 2023-05-13 DIAGNOSIS — R0789 Other chest pain: Secondary | ICD-10-CM | POA: Diagnosis not present

## 2023-05-13 DIAGNOSIS — R079 Chest pain, unspecified: Secondary | ICD-10-CM | POA: Diagnosis not present

## 2023-05-13 DIAGNOSIS — Z9049 Acquired absence of other specified parts of digestive tract: Secondary | ICD-10-CM | POA: Insufficient documentation

## 2023-05-13 DIAGNOSIS — I2510 Atherosclerotic heart disease of native coronary artery without angina pectoris: Secondary | ICD-10-CM | POA: Diagnosis not present

## 2023-05-13 LAB — CBC WITH DIFFERENTIAL/PLATELET
Abs Immature Granulocytes: 0.04 10*3/uL (ref 0.00–0.07)
Basophils Absolute: 0 10*3/uL (ref 0.0–0.1)
Basophils Relative: 0 %
Eosinophils Absolute: 0.2 10*3/uL (ref 0.0–0.5)
Eosinophils Relative: 2 %
HCT: 42.2 % (ref 39.0–52.0)
Hemoglobin: 14.3 g/dL (ref 13.0–17.0)
Immature Granulocytes: 1 %
Lymphocytes Relative: 14 %
Lymphs Abs: 1.1 10*3/uL (ref 0.7–4.0)
MCH: 31.1 pg (ref 26.0–34.0)
MCHC: 33.9 g/dL (ref 30.0–36.0)
MCV: 91.7 fL (ref 80.0–100.0)
Monocytes Absolute: 0.6 10*3/uL (ref 0.1–1.0)
Monocytes Relative: 8 %
Neutro Abs: 6.1 10*3/uL (ref 1.7–7.7)
Neutrophils Relative %: 75 %
Platelets: 179 10*3/uL (ref 150–400)
RBC: 4.6 MIL/uL (ref 4.22–5.81)
RDW: 13.7 % (ref 11.5–15.5)
WBC: 8.1 10*3/uL (ref 4.0–10.5)
nRBC: 0 % (ref 0.0–0.2)

## 2023-05-13 LAB — BASIC METABOLIC PANEL
Anion gap: 10 (ref 5–15)
BUN: 22 mg/dL (ref 8–23)
CO2: 24 mmol/L (ref 22–32)
Calcium: 9.3 mg/dL (ref 8.9–10.3)
Chloride: 101 mmol/L (ref 98–111)
Creatinine, Ser: 1.32 mg/dL — ABNORMAL HIGH (ref 0.61–1.24)
GFR, Estimated: 56 mL/min — ABNORMAL LOW (ref >60)
Glucose, Bld: 88 mg/dL (ref 70–99)
Potassium: 3.8 mmol/L (ref 3.5–5.1)
Sodium: 135 mmol/L (ref 135–145)

## 2023-05-13 LAB — D-DIMER, QUANTITATIVE: D-Dimer, Quant: 1.28 ug{FEU}/mL — ABNORMAL HIGH (ref 0.00–0.50)

## 2023-05-13 LAB — MAGNESIUM: Magnesium: 2.1 mg/dL (ref 1.7–2.4)

## 2023-05-13 LAB — TROPONIN I (HIGH SENSITIVITY)
Troponin I (High Sensitivity): 13 ng/L (ref <18)
Troponin I (High Sensitivity): 13 ng/L (ref <18)

## 2023-05-13 LAB — C-REACTIVE PROTEIN: CRP: 1.7 mg/dL — ABNORMAL HIGH (ref <1.0)

## 2023-05-13 LAB — SEDIMENTATION RATE: Sed Rate: 25 mm/h — ABNORMAL HIGH (ref 0–16)

## 2023-05-13 LAB — BRAIN NATRIURETIC PEPTIDE: B Natriuretic Peptide: 88 pg/mL (ref 0.0–100.0)

## 2023-05-13 MED ORDER — IOHEXOL 350 MG/ML SOLN
75.0000 mL | Freq: Once | INTRAVENOUS | Status: AC | PRN
  Administered 2023-05-13: 75 mL via INTRAVENOUS

## 2023-05-13 MED ORDER — ENOXAPARIN SODIUM 40 MG/0.4ML IJ SOSY
40.0000 mg | PREFILLED_SYRINGE | INTRAMUSCULAR | Status: DC
  Administered 2023-05-13 – 2023-05-14 (×2): 40 mg via SUBCUTANEOUS
  Filled 2023-05-13 (×2): qty 0.4

## 2023-05-13 MED ORDER — ACETAMINOPHEN 325 MG PO TABS
650.0000 mg | ORAL_TABLET | Freq: Four times a day (QID) | ORAL | Status: DC | PRN
  Administered 2023-05-13: 650 mg via ORAL
  Filled 2023-05-13: qty 2

## 2023-05-13 MED ORDER — POLYETHYLENE GLYCOL 3350 17 G PO PACK: 17.0000 g | PACK | Freq: Every day | ORAL | Status: DC | PRN

## 2023-05-13 MED ORDER — IPRATROPIUM-ALBUTEROL 0.5-2.5 (3) MG/3ML IN SOLN: 3.0000 mL | Freq: Four times a day (QID) | RESPIRATORY_TRACT | Status: DC | PRN

## 2023-05-13 MED ORDER — ONDANSETRON HCL 4 MG PO TABS: 4.0000 mg | ORAL_TABLET | Freq: Four times a day (QID) | ORAL | Status: DC | PRN

## 2023-05-13 MED ORDER — LEVOTHYROXINE SODIUM 100 MCG PO TABS
100.0000 ug | ORAL_TABLET | Freq: Every day | ORAL | Status: DC
Start: 2023-05-14 — End: 2023-05-15
  Administered 2023-05-14 – 2023-05-15 (×2): 100 ug via ORAL
  Filled 2023-05-13 (×2): qty 1

## 2023-05-13 MED ORDER — COLCHICINE 0.6 MG PO TABS
0.6000 mg | ORAL_TABLET | Freq: Every day | ORAL | Status: DC
  Administered 2023-05-13 – 2023-05-15 (×3): 0.6 mg via ORAL
  Filled 2023-05-13 (×3): qty 1

## 2023-05-13 MED ORDER — ACETAMINOPHEN 650 MG RE SUPP: 650.0000 mg | Freq: Four times a day (QID) | RECTAL | Status: DC | PRN

## 2023-05-13 MED ORDER — ONDANSETRON HCL 4 MG/2ML IJ SOLN: 4.0000 mg | Freq: Four times a day (QID) | INTRAMUSCULAR | Status: DC | PRN

## 2023-05-13 MED ORDER — METOPROLOL SUCCINATE ER 25 MG PO TB24
25.0000 mg | ORAL_TABLET | Freq: Every day | ORAL | Status: DC
  Administered 2023-05-13 – 2023-05-15 (×3): 25 mg via ORAL
  Filled 2023-05-13 (×3): qty 1

## 2023-05-13 MED ORDER — ATORVASTATIN CALCIUM 20 MG PO TABS
20.0000 mg | ORAL_TABLET | Freq: Every day | ORAL | Status: DC
  Administered 2023-05-13 – 2023-05-14 (×2): 20 mg via ORAL
  Filled 2023-05-13 (×3): qty 1

## 2023-05-13 MED ORDER — FUROSEMIDE 20 MG PO TABS
20.0000 mg | ORAL_TABLET | Freq: Every day | ORAL | Status: DC
Start: 2023-05-13 — End: 2023-05-15
  Administered 2023-05-13 – 2023-05-14 (×2): 20 mg via ORAL
  Filled 2023-05-13 (×2): qty 1

## 2023-05-13 MED ORDER — ASPIRIN 325 MG PO TABS
1000.0000 mg | ORAL_TABLET | Freq: Three times a day (TID) | ORAL | Status: DC
  Administered 2023-05-14 – 2023-05-15 (×4): 975 mg via ORAL
  Filled 2023-05-13 (×4): qty 3

## 2023-05-13 MED ORDER — PANTOPRAZOLE SODIUM 40 MG PO TBEC
40.0000 mg | DELAYED_RELEASE_TABLET | Freq: Every day | ORAL | Status: DC
  Administered 2023-05-13 – 2023-05-15 (×3): 40 mg via ORAL
  Filled 2023-05-13 (×3): qty 1

## 2023-05-13 MED ORDER — SODIUM CHLORIDE 0.9 % IV BOLUS
500.0000 mL | Freq: Once | INTRAVENOUS | Status: AC
  Administered 2023-05-13: 500 mL via INTRAVENOUS

## 2023-05-13 MED ORDER — FENTANYL CITRATE PF 50 MCG/ML IJ SOSY
50.0000 ug | PREFILLED_SYRINGE | Freq: Once | INTRAMUSCULAR | Status: DC
Start: 2023-05-13 — End: 2023-05-13

## 2023-05-13 NOTE — Assessment & Plan Note (Signed)
Status post thyroidectomy for thyroid cancer at [facility] 04/2023.  He is to follow-up with endocrinology. -Resume Synthroid

## 2023-05-13 NOTE — ED Triage Notes (Signed)
Pt c/o shortness of breath that has gotten progressively worse since "his surgery on his throat" pt had this surgery 13th of November. States shob is worse when trying to take a breath or "doing crunches" or lying down. Also states CP when trying to take a deep breath. Denies dizziness but states "I have some vertigo". Hx of aneurysm in descending aorta. Rates 5/10

## 2023-05-13 NOTE — Assessment & Plan Note (Signed)
-

## 2023-05-13 NOTE — ED Notes (Signed)
We do not carry the specific urostomy he wears at night time--pt made aware

## 2023-05-13 NOTE — ED Notes (Signed)
Pt states he wears a cpap at night when he sleeps due to sleep apnea--MD made aware

## 2023-05-13 NOTE — Assessment & Plan Note (Addendum)
Stable and compensated.  Last echo 12/2022 EF of less than 20%. -500 bolus given in ED -Resume Lasix, metoprolol

## 2023-05-13 NOTE — ED Provider Notes (Signed)
  Provider Note MRN:  [id number]  Arrival date & time: 05/13/23    ED Course and Medical Decision Making  Assumed care from [place] at shift change.  See note from prior team for complete details, in brief:  77 yo male Chest pain x1 month, daily, intermittent CT PE with patchy inflammation, ?aortitis, ?pericarditis  Dr Dorris Fetch consulted, no acute concerns from surgical perspective  Pending cards eval:   Dr Kathlen Mody; requesting ESR/CRP, culture, echo, obs/admit   Clinical Course as of 05/13/23 1717  Thu May 13, 2023  0922 EKG not crossing in epic.  Appears to be sinus with borderline PR prolongation nonspecific intraventricular conduction delay. [MB]  [id number] Chest x-ray interpreted by me as cardiomegaly no gross infiltrates.  AICD in place. [MB]  1219 Discussed with CT surgery PA.  They will reach Dr. Dorris Fetch when he gets out of the OR to review the imaging and she will get back to me. [MB]  1544 Dr. Orson Aloe got back with me and they said there is no surgical concerns.  I placed a consult into cardiology Dr. Ancil Boozer and she recommended getting inflammatory markers.  She will get back to [place] with recommendations. [MB]  [id number] Spoke with Dr. Jenene Slicker, for possible pericarditis versus aortitis.  She is requesting blood cultures and ESR/CRP.  She is going steroids medications.  Recommending hospitalist admission and she will follow in consult [SG]  1716 [facility] will admit [SG]    Clinical Course User Index [MB] Terrilee Files, MD [SG] Sloan Leiter, DO     See cardiology consultation.  They are recommending hospitalist admission for further evaluation of aortitis versus pericarditis.  Requesting blood cultures, ESR CRP.  Also echocardiogram.  She will place some orders and start medication including aspirin and colchicine.  She will continue to follow along, will admit [facility]     Procedures  Final Clinical Impressions(s) / ED Diagnoses     ICD-10-CM   1. Pericarditis,  unspecified chronicity, unspecified type  I31.9     2. Aortitis [facility])  I77.6       ED Discharge Orders     None       Discharge Instructions   None        Sloan Leiter, DO 05/13/23 1717

## 2023-05-13 NOTE — Assessment & Plan Note (Addendum)
Stable. -Resume metoprolol,  -Resume Lasix -Hold losartan for contrast exposure

## 2023-05-13 NOTE — ED Provider Notes (Signed)
[place] EMERGENCY DEPARTMENT AT [facility] Provider Note   CSN: [id number] Arrival date & time: 05/13/23  0830     History  Chief Complaint  Patient presents with   Shortness of Breath    Billy Rasmussen is a 77 y.o. male.  He has a history of heart failure with reduced ejection fraction, AICD, renal cancer status post nephrectomy, ascending aortic aneurysm.  He had thyroid surgery done about a month ago.  Since then he has had upper pain in his chest daily with shortness of breath.  Pain seems worse when being in cold air.  Tells me he had an MRI done last week of his heart.  He is supposed to get more thyroid interventions next month and is afraid that there might be something going on with his heart now and wants to get it taken care of.  Does not sound like he seen his cardiologist in over a month.  The history is provided by the patient.  Chest Pain Pain location:  Substernal area Pain quality: aching   Pain severity:  Moderate Onset quality:  Gradual Duration:  1 month Timing:  Intermittent Progression:  Unchanged Chronicity:  New Relieved by:  None tried Worsened by:  Deep breathing Ineffective treatments:  None tried Associated symptoms: dizziness, dysphagia and shortness of breath   Associated symptoms: no abdominal pain, no cough, no diaphoresis, no fever, no lower extremity edema, no nausea and no vomiting   Risk factors: surgery        Home Medications Prior to Admission medications   Medication Sig Start Date End Date Taking? Authorizing Provider  acetaminophen (TYLENOL) 650 MG CR tablet Take 1,300 mg by mouth daily as needed for pain.    [provider]  acetaminophen-codeine (TYLENOL #3) 300-30 MG tablet Take 1 tablet by mouth every 8 (eight) hours as needed for moderate pain. 01/22/23   Raliegh Ip, DO  Albuterol-Budesonide (AIRSUPRA) 90-80 MCG/ACT AERO Inhale 2 puffs into the lungs every 6 (six) hours as needed (shortness of  breath/ wheeze). 03/16/23   Raliegh Ip, DO  atorvastatin (LIPITOR) 20 MG tablet Take 1 tablet (20 mg total) by mouth daily. 10/23/22 04/14/23  Little Ishikawa, MD  fluticasone (FLONASE) 50 MCG/ACT nasal spray Place 1 spray into both nostrils daily. 02/03/23   [provider]  furosemide (LASIX) 20 MG tablet Take 1 tablet (20 mg) daily as needed if you gain more than 3 pounds in 1 day or 5 pounds in 1 week. 10/29/22   Allayne Butcher, PA-C  levothyroxine (SYNTHROID) 100 MCG tablet Take 1 tablet (100 mcg total) by mouth daily. 04/15/23   [name], MD  levothyroxine (SYNTHROID) 25 MCG tablet Take 1 tablet (25 mcg total) by mouth daily before breakfast. Take on an empty stomach with water only.  Wait to consume other foods/ drinks/ meds for 30 minutes. 08/24/22   Raliegh Ip, DO  losartan (COZAAR) 25 MG tablet Take 1 tablet (25 mg total) by mouth daily. 12/14/22 04/14/23  Lennette Bihari, MD  metoprolol succinate (TOPROL-XL) 25 MG 24 hr tablet Take 1 tablet (25 mg total) by mouth daily. 12/14/22   Raliegh Ip, DO  Multiple Vitamin (MULTIVITAMIN) capsule Take 1 capsule by mouth daily.    [provider]  polyvinyl alcohol (LIQUIFILM TEARS) 1.4 % ophthalmic solution Place 1 drop into both eyes as needed for dry eyes. 05/16/19   Saundra Shelling, MD  Allergies    Patient has no known allergies.    Review of Systems   Review of Systems  Constitutional:  Negative for diaphoresis and fever.  HENT:  Positive for trouble swallowing.   Respiratory:  Positive for shortness of breath. Negative for cough.   Cardiovascular:  Positive for chest pain.  Gastrointestinal:  Negative for abdominal pain, nausea and vomiting.  Neurological:  Positive for dizziness.    Physical Exam Updated Vital Signs BP 130/75   Pulse 69   Temp 98.4 F (36.9 C) (Oral) Comment: Simultaneous filing. User may not have seen previous data. Comment (Src): Simultaneous filing.  User may not have seen previous data.  Resp 14   SpO2 96%  Physical Exam Vitals and nursing note reviewed.  Constitutional:      General: He is not in acute distress.    Appearance: He is well-developed.  HENT:     Head: Normocephalic and atraumatic.  Eyes:     Conjunctiva/sclera: Conjunctivae normal.  Cardiovascular:     Rate and Rhythm: Normal rate and regular rhythm.     Heart sounds: No murmur heard. Pulmonary:     Effort: Pulmonary effort is normal. No respiratory distress.     Breath sounds: Normal breath sounds.  Abdominal:     Palpations: Abdomen is soft.     Tenderness: There is no abdominal tenderness.  Musculoskeletal:        General: No swelling.     Cervical back: Neck supple.     Right lower leg: No tenderness. No edema.     Left lower leg: No tenderness. No edema.  Skin:    General: Skin is warm and dry.     Capillary Refill: Capillary refill takes less than 2 seconds.  Neurological:     General: No focal deficit present.     Mental Status: He is alert.     ED Results / Procedures / Treatments   Labs (all labs ordered are listed, but only abnormal results are displayed) Labs Reviewed  BASIC METABOLIC PANEL - Abnormal; Notable for the following components:      Result Value   Creatinine, Ser 1.32 (*)    GFR, Estimated 56 (*)    All other components within normal limits  D-DIMER, QUANTITATIVE - Abnormal; Notable for the following components:   D-Dimer, Quant 1.28 (*)    All other components within normal limits  C-REACTIVE PROTEIN - Abnormal; Notable for the following components:   CRP 1.7 (*)    All other components within normal limits  SEDIMENTATION RATE - Abnormal; Notable for the following components:   Sed Rate 25 (*)    All other components within normal limits  CULTURE, BLOOD (ROUTINE X 2)  CULTURE, BLOOD (ROUTINE X 2)  CBC WITH DIFFERENTIAL/PLATELET  BRAIN NATRIURETIC PEPTIDE  MAGNESIUM  BASIC METABOLIC PANEL  MAGNESIUM  CBC  TROPONIN  I (HIGH SENSITIVITY)  TROPONIN I (HIGH SENSITIVITY)    EKG EKG Interpretation Date/Time:  Thursday May 13 2023 08:48:53 EST Ventricular Rate:  75 PR Interval:  217 QRS Duration:  163 QT Interval:  478 QTC Calculation: 534 R Axis:   111  Text Interpretation: Sinus rhythm Borderline prolonged PR interval Nonspecific intraventricular conduction delay Probable lateral infarct, age indeterminate Confirmed by Meridee Score [phone]) on 05/13/2023 9:21:53 AM  Radiology CT Angio Chest PE W/Cm &/Or Wo Cm Result Date: 05/13/2023 CLINICAL DATA:  Pulmonary embolism (PE) suspected, high prob. Shortness of breath. EXAM: CT ANGIOGRAPHY CHEST WITH CONTRAST TECHNIQUE: Multidetector  CT imaging of the chest was performed using the standard protocol during bolus administration of intravenous contrast. Multiplanar CT image reconstructions and MIPs were obtained to evaluate the vascular anatomy. RADIATION DOSE REDUCTION: This exam was performed according to the departmental dose-optimization program which includes automated exposure control, adjustment of the mA and/or kV according to patient size and/or use of iterative reconstruction technique. CONTRAST:  75mL OMNIPAQUE IOHEXOL 350 MG/ML SOLN COMPARISON:  CT scan chest from 03/22/2023. FINDINGS: Cardiovascular: No evidence of embolism to the proximal subsegmental pulmonary artery level. Mild cardiomegaly. Redemonstration of aneurysm of ascending aorta measuring up to 4.5 cm in diameter; however, please note the measurements are susceptible to cardiac pulsation artifact on this non cardiac gated exam. Note is also made of mild circumferential wall thickening predominantly of the ascending aorta and proximal aortic arch. There are also additional areas of focal wall thickening of the descending thoracic aorta (marked with electronic arrow sign on series 4). There is also mild periaortic fat stranding, which is new since the prior study. There is also new mild  thickening of the pericardium and trace pericardial fluid, when compared to the prior CT scan. Trace amount of periaortic/pericardial recess free fluid is grossly unchanged. In the appropriate clinical setting, findings are concerning for aortitis/pericarditis. There are coronary artery calcifications, in keeping with coronary artery disease. There is left-sided 3 lead cardiac pacemaker. Mediastinum/Nodes: Markedly limited evaluation of the thyroid gland due to extensive streak artifacts from hardware. No solid / cystic mediastinal masses. The esophagus is nondistended precluding optimal assessment. There are few mildly prominent mediastinal and hilar lymph nodes, which do not meet the size criteria for lymphadenopathy and appear grossly similar to the prior study, favoring benign etiology. No axillary lymphadenopathy by size criteria. Lungs/Pleura: The central tracheo-bronchial tree is patent. There is new trace left pleural effusion with associated minimal atelectatic changes in the left lung lower lobe. No mass, consolidation or pneumothorax. No right pleural effusion. No suspicious lung nodules. Upper Abdomen: Moderate volume layering calcified gallstones noted without imaging signs of acute cholecystitis. Remaining visualized upper abdominal viscera within normal limits. Musculoskeletal: The visualized soft tissues of the chest wall are grossly unremarkable. No suspicious osseous lesions. There are mild multilevel degenerative changes in the visualized spine. Bilateral shoulder arthroplasty noted. Review of the MIP images confirms the above findings. IMPRESSION: *No evidence of embolism to the proximal subsegmental pulmonary artery level. *There is mild circumferential wall thickening predominantly of the ascending aorta and proximal aortic arch with patchy areas of thickening in the descending thoracic aorta, as described above, concerning for aortitis. *There is also new mild pericardial thickening/trace  pericardial fluid, concerning for pericarditis. *There is new left trace pleural effusion. *Multiple other nonacute observations, as described above. Aortic aneurysm NOS (ICD10-I71.9). Electronically Signed   By: Jules Schick M.D.   On: 05/13/2023 11:40   DG Chest Port 1 View Result Date: 05/13/2023 CLINICAL DATA:  Cough, shortness of breath EXAM: PORTABLE CHEST 1 VIEW COMPARISON:  03/16/2023 FINDINGS: Left-sided implanted cardiac device remains in place. Stable mild cardiomegaly. No focal airspace consolidation, pleural effusion, or pneumothorax. Bilateral shoulder prostheses. IMPRESSION: No active disease. Electronically Signed   By: Duanne Guess D.O.   On: 05/13/2023 09:30    Procedures Procedures    Medications Ordered in ED Medications  ipratropium-albuterol (DUONEB) 0.5-2.5 (3) MG/3ML nebulizer solution 3 mL (has no administration in time range)  atorvastatin (LIPITOR) tablet 20 mg (20 mg Oral Given 05/13/23 2211)  furosemide (LASIX) tablet 20 mg (  20 mg Oral Given 05/13/23 2211)  levothyroxine (SYNTHROID) tablet 100 mcg (100 mcg Oral Given 05/14/23 0615)  metoprolol succinate (TOPROL-XL) 24 hr tablet 25 mg (25 mg Oral Given 05/14/23 0911)  enoxaparin (LOVENOX) injection 40 mg (40 mg Subcutaneous Given 05/13/23 2214)  acetaminophen (TYLENOL) tablet 650 mg (650 mg Oral Given 05/13/23 2220)    Or  acetaminophen (TYLENOL) suppository 650 mg ( Rectal See Alternative 05/13/23 2220)  ondansetron (ZOFRAN) tablet 4 mg (has no administration in time range)    Or  ondansetron (ZOFRAN) injection 4 mg (has no administration in time range)  polyethylene glycol (MIRALAX / GLYCOLAX) packet 17 g (has no administration in time range)  aspirin tablet 975 mg (975 mg Oral Given 05/14/23 1705)  colchicine tablet 0.6 mg (0.6 mg Oral Given 05/14/23 0911)  pantoprazole (PROTONIX) EC tablet 40 mg (40 mg Oral Given 05/14/23 0911)  perflutren lipid microspheres (DEFINITY) IV suspension (8 mLs  Intravenous Given 05/14/23 1130)  sodium chloride 0.9 % bolus 500 mL (500 mLs Intravenous Bolus 05/13/23 1005)  iohexol (OMNIPAQUE) 350 MG/ML injection 75 mL (75 mLs Intravenous Contrast Given 05/13/23 1054)    ED Course/ Medical Decision Making/ A&P Clinical Course as of 05/14/23 1716  Thu May 13, 2023  0922 EKG not crossing in epic.  Appears to be sinus with borderline PR prolongation nonspecific intraventricular conduction delay. [MB]  [id number] Chest x-ray interpreted by me as cardiomegaly no gross infiltrates.  AICD in place. [MB]  1219 Discussed with CT surgery PA.  They will reach Dr. Dorris Fetch when he gets out of the OR to review the imaging and she will get back to me. [MB]  1544 Dr. Orson Aloe got back with me and they said there is no surgical concerns.  I placed a consult into cardiology Dr. Ancil Boozer and she recommended getting inflammatory markers.  She will get back to [place] with recommendations. [MB]  [id number] Spoke with Dr. Jenene Slicker, for possible pericarditis versus aortitis.  She is requesting blood cultures and ESR/CRP.  She is going steroids medications.  Recommending hospitalist admission and she will follow in consult [SG]  1716 [facility] will admit [SG]    Clinical Course User Index [MB] Terrilee Files, MD [SG] Sloan Leiter, DO                                 Medical Decision Making Amount and/or Complexity of Data Reviewed Labs: ordered. Radiology: ordered.  Risk Prescription drug management. Decision regarding hospitalization.   This patient complains of on and off chest pain for a month; this involves an extensive number of treatment Options and is a complaint that carries with it a high risk of complications and morbidity. The differential includes ACS, pneumonia, reflux, PE, vascular, musculoskeletal  I ordered, reviewed and interpreted labs, which included CBC normal chemistries normal other than mildly elevated creatinine better than baseline, inflammatory  markers mildly elevated, troponins flat I ordered medication IV fluids and reviewed PMP when indicated. I ordered imaging studies which included chest x-ray, CT angio PE and I independently    visualized and interpreted imaging which showed no evidence of PE.  Does have inflammation of his aorta however Additional history obtained from patient's wife Previous records obtained and reviewed in epic including recent vascular and cardiology notes I consulted cardiology Dr. Jenene Slicker cardiothoracic surgery Dr. Dorris Fetch and discussed lab and imaging findings and discussed disposition.  Cardiac monitoring reviewed, sinus rhythm Social  determinants considered, physically inactive Critical Interventions: None  After the interventions stated above, I reevaluated the patient and found patient to be well-appearing hemodynamically stable Admission and further testing considered, awaiting formal recommendations from cardiology.  Patient's care signed out to Dr. 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

## 2023-05-13 NOTE — ED Notes (Signed)
Pt given a pack of crackers with peanut butter

## 2023-05-13 NOTE — Consult Note (Signed)
CARDIOLOGY CONSULT NOTE    Patient ID: Billy Rasmussen; [id number]; [birth year]   Admit date: 05/13/2023 Date of Consult: 05/13/2023  Primary Care Provider: Raliegh Ip, DO Primary Cardiologist:  Primary Electrophysiologist:    History of Present Illness:   Billy Rasmussen is a 77 year old M known to have NICM LVEF less than 20% s/p CRT-D in 01/2023, bladder cancer s/p L nephrectomy and cystoprostatectomy with ileal conduit, thyroid cancer s/p thyroidectomy 04/2023 with residual small thyroid tissue with cancer pending radioactive iodine ablation in January 2025, ascending aortic aneurysm 4.5 cm, OSA, psoriasis presented to the ER with chest pain.  He underwent thyroidectomy at [facility] in 04/2023 after which he started to have daily chest pains with any activity like deep breathing, coughing, singing loud in the church, straining etc.  He sleeps in a recliner and does not sleep on his back due to back pain issues.  No other symptoms of SOB, syncope, palpitations or dizziness.  No leg swelling.  Patient underwent CT angio chest to rule out PE that showed no evidence of PE but circumferential wall thickening in the ascending aorta and proximal aortic arch with patchy areas of thickening in the descending aorta concerning for aortitis.  There was also new mild pericardial thickening/trace pericardial effusion concerning for pericarditis.  Past Medical History:  Diagnosis Date   AICD (automatic cardioverter/defibrillator) present    Abbott/St Jude   Arthritis    knees, shoulders, elbows   Bladder cancer [facility])    urologist-  dr Mena Goes   CHF (congestive heart failure) (HCC)    Diverticulosis of colon    Fibromyalgia    GERD (gastroesophageal reflux disease)    History of diverticulitis of colon    History of gastric ulcer    due to aleve   Hypertension    Lower urinary tract symptoms (LUTS)    OSA (obstructive sleep apnea)    Wears CPAP nightly   PONV (postoperative nausea and  vomiting)    Psoriasis    Sepsis (HCC)    january 2021   Tinnitus    right ear more, has tranmitter in right ear removable at hs   Wears glasses    Wears partial dentures     Past Surgical History:  Procedure Laterality Date   APPENDECTOMY     BIV ICD INSERTION CRT-D N/A 03/01/2023   Procedure: BIV ICD INSERTION CRT-D;  Surgeon: Maurice Small, MD;  [facility];  Service: Cardiovascular;  Laterality: N/A;   COLECTOMY W/ COLOSTOMY  1996   W/   APPENDECTOMY   COLONOSCOPY N/A 05/11/2018   Procedure: COLONOSCOPY;  Surgeon: Corbin Ade, MD;  [facility]: [facility];  Service: Endoscopy;  Laterality: N/A;  9:30   COLOSTOMY TAKEDOWN  1996   CYSTOSCOPY WITH BIOPSY N/A 12/05/2013   Procedure: CYSTO BLADDER BIOPSY AND FULGERATION;  Surgeon: Jerilee Field, MD;  Location: [facility];  Service: Urology;  Laterality: N/A;   CYSTOSCOPY WITH BIOPSY Bilateral 11/13/2014   Procedure: CYSTOSCOPY WITH  BLADDER BIOPSY FULGERATION AND BILATERAL RETROGRADE PYELOGRAMS;  Surgeon: Jerilee Field, MD;  Location: [facility];  Service: Urology;  Laterality: Bilateral;   CYSTOSCOPY WITH FULGERATION N/A 01/18/2018   Procedure: CYSTOSCOPY WITH FULGERATION/ BLADDER BIOPSY;  Surgeon: Jerilee Field, MD;  Location: [facility];  Service: Urology;  Laterality: N/A;   CYSTOSCOPY WITH INJECTION N/A 11/09/2018   Procedure: CYSTOSCOPY WITH INJECTION OF INDOCYANINE GREEN DYE;  Surgeon: Sebastian Ache, MD;  Location: [facility];  Service: Urology;  Laterality: N/A;   CYSTOSCOPY WITH INSERTION OF UROLIFT N/A 01/18/2018   Procedure: CYSTOSCOPY WITH INSERTION OF UROLIFT;  Surgeon: Jerilee Field, MD;  Location: [facility];  Service: Urology;  Laterality: N/A;   CYSTOSCOPY/URETEROSCOPY/HOLMIUM LASER Left 02/17/2019   Procedure: URETEROSCOPY WITH BALLOON DILATION  AND NEPHROSTOGRAM;  Surgeon: Sebastian Ache, MD;  Location: [facility];  Service: Urology;  Laterality: Left;  1 HR   EXCISION RIGHT UPPER ARM LIPOMA  2005   HEMORROIDECTOMY     INGUINAL HERNIA REPAIR Left 1984   IR NEPHROSTOMY PLACEMENT LEFT  01/05/2019   KNEE ARTHROSCOPY Left X3  LAST ONE  2002   ORIF LEFT HUMEROUS FX  1976   POLYPECTOMY  05/11/2018   Procedure: POLYPECTOMY;  Surgeon: Corbin Ade, MD;  [facility];  Service: Endoscopy;;   PROSTATE SURGERY     RIGHT HEART CATH AND CORONARY ANGIOGRAPHY N/A 10/09/2022   Procedure: RIGHT HEART CATH AND CORONARY ANGIOGRAPHY;  Surgeon: [name], MD;  [facility];  Service: Cardiovascular;  Laterality: N/A;   ROBOT ASSISTED LAPAROSCOPIC NEPHRECTOMY Left 07/14/2019   Procedure: XI ROBOTIC ASSISTED LAPAROSCOPIC RETROPERITONEAL NEPHRECTOMY;  Surgeon: Sebastian Ache, MD;  Location: [facility];  Service: Urology;  Laterality: Left;  3 HRS   THYROIDECTOMY Bilateral 04/14/2023   Procedure: TOTAL THYROIDECTOMY;  Surgeon: [name], MD;  Location: [facility];  Service: ENT;  Laterality: Bilateral;   TONSILLECTOMY  AS CHILD   TOTAL KNEE ARTHROPLASTY Left 2006   REVISION 2007  (AFTER I & D WITH ANTIBIOTIC SPACER PROCEDURE FOR STEPH INFECTION)   TOTAL KNEE ARTHROPLASTY Right 03/30/2016   Procedure: RIGHT TOTAL KNEE ARTHROPLASTY;  Surgeon: Durene Romans, MD;  Location: [facility];  Service: Orthopedics;  Laterality: Right;   TOTAL SHOULDER ARTHROPLASTY Right 04/06/2013   Procedure: RIGHT TOTAL SHOULDER ARTHROPLASTY;  Surgeon: Senaida Lange, MD;  Location: [facility];  Service: Orthopedics;  Laterality: Right;   TOTAL SHOULDER ARTHROPLASTY Left 07/20/2013   Procedure: LEFT TOTAL SHOULDER ARTHROPLASTY;  Surgeon: Senaida Lange, MD;  Location: [facility];  Service: Orthopedics;  Laterality: Left;   TRANSURETHRAL RESECTION OF BLADDER TUMOR N/A 11/12/2015   Procedure: TRANSURETHRAL RESECTION OF BLADDER TUMOR (TURBT);  Surgeon: Jerilee Field, MD;  Location: [facility];  Service: Urology;   Laterality: N/A;   TRANSURETHRAL RESECTION OF BLADDER TUMOR WITH GYRUS (TURBT-GYRUS) N/A 12/05/2013   Procedure: TRANSURETHRAL RESECTION OF BLADDER TUMOR WITH GYRUS (TURBT-GYRUS);  Surgeon: Jerilee Field, MD;  Location: [facility];  Service: Urology;  Laterality: N/A;    Inpatient Medications: Scheduled Meds:  Continuous Infusions:  PRN Meds:   Allergies:   No Known Allergies  Social History:   Social History   Socioeconomic History   Marital status: Married    Spouse name: Peggy   Number of children: 2   Years of education: 14   Highest education level: Associate degree: occupational, Scientist, product/process development, or vocational program  Occupational History   Occupation: retired    Comment: [employer], 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   Tobacco Use   Smoking status: Former    Current packs/day: 0.00    Average packs/day: 2.0 packs/day for 40.0 years (80.0 ttl pk-yrs)    Types: Cigarettes    Start date: 06/01/1957    Quit date: 06/01/1997    Years since quitting: 25.9   Smokeless tobacco: Never  Vaping Use   Vaping status: Never Used  Substance and Sexual Activity  Alcohol use: No   Drug use: No   Sexual activity: Not Currently  Other Topics Concern   Not on file  Social History Narrative   One level home with wife    09/2162 - 63 year old MIL lives with them   Caffiene 2 cups daily, tea occas.   Retired, Visual merchandiser   Social Drivers of Corporate investment banker Strain: Medium Risk (12/11/2022)   Overall Financial Resource Strain (CARDIA)    Difficulty of Paying Living Expenses: Somewhat hard  Food Insecurity: No Food Insecurity (04/14/2023)   Hunger Vital Sign    Worried About Running Out of Food in the Last Year: Never true    Ran Out of Food in the Last Year: Never true  Transportation Needs: No Transportation Needs (04/14/2023)   PRAPARE - Administrator, Civil Service (Medical): No    Lack of Transportation (Non-Medical): No  Physical Activity: Inactive  (12/11/2022)   Exercise Vital Sign    Days of Exercise per Week: 0 days    Minutes of Exercise per Session: 0 min  Stress: No Stress Concern Present (12/11/2022)   [employer] of Occupational Health - Occupational Stress Questionnaire    Feeling of Stress : Not at all  Social Connections: Socially Integrated (12/11/2022)   Social Connection and Isolation Panel [NHANES]    Frequency of Communication with Friends and Family: Three times a week    Frequency of Social Gatherings with Friends and Family: Once a week    Attends Religious Services: 1 to 4 times per year    Active Member of [employer] or Organizations: Yes    Attends Banker Meetings: 1 to 4 times per year    Marital Status: Married  Catering manager Violence: Not At Risk (04/14/2023)   Humiliation, Afraid, Rape, and Kick questionnaire    Fear of Current or Ex-Partner: No    Emotionally Abused: No    Physically Abused: No    Sexually Abused: No    Family History:    Family History  Problem Relation Age of Onset   Alzheimer's disease Mother    Heart attack Father    Heart disease Father    Irregular heart beat Brother        DEFIB.   Congestive Heart Failure Brother    Diabetes Daughter    Sleep apnea Neg Hx      ROS:  Please see the history of present illness.  ROS  All other ROS reviewed and negative.     Physical Exam/Data:   Vitals:   05/13/23 1430 05/13/23 1500 05/13/23 1515 05/13/23 1616  BP: 124/69 132/66 138/66   Pulse: 71 71 73   Resp: 19 19 19    Temp:    98.5 F (36.9 C)  TempSrc:    Oral  SpO2: 94% 96% 96%    No intake or output data in the 24 hours ending 05/13/23 1635 There were no vitals filed for this visit. There is no height or weight on file to calculate BMI.  General:  Well nourished, well developed, in no acute distress HEENT: normal Lymph: no adenopathy Neck: no JVD Endocrine:  No thryomegaly Vascular: No carotid bruits; FA pulses 2+ bilaterally without bruits   Cardiac:  normal S1, S2; RRR; no murmur  Lungs:  clear to auscultation bilaterally, no wheezing, rhonchi or rales  Abd: soft, nontender, no hepatomegaly  Ext: no edema Musculoskeletal:  No deformities, BUE and BLE strength normal and equal Skin:  warm and dry  Neuro:  CNs 2-12 intact, no focal abnormalities noted Psych:  Normal affect   EKG:  The EKG was personally reviewed and demonstrates: NSR, first AV block, no ischemia Telemetry:  Telemetry was personally reviewed and demonstrates:    Relevant CV Studies:   Laboratory Data:  Chemistry Recent Labs  Lab 05/13/23 0850  NA 135  K 3.8  CL 101  CO2 24  GLUCOSE 88  BUN 22  CREATININE 1.32*  CALCIUM 9.3  GFRNONAA 56*  ANIONGAP 10    No results for input(s): "PROT", "ALBUMIN", "AST", "ALT", "ALKPHOS", "BILITOT" in the last 168 hours. Hematology Recent Labs  Lab 05/13/23 0850  WBC 8.1  RBC 4.60  HGB 14.3  HCT 42.2  MCV 91.7  MCH 31.1  MCHC 33.9  RDW 13.7  PLT 179   Cardiac EnzymesNo results for input(s): "TROPONINI" in the last 168 hours. No results for input(s): "TROPIPOC" in the last 168 hours.  BNP Recent Labs  Lab 05/13/23 0850  BNP 88.0    DDimer  Recent Labs  Lab 05/13/23 0850  DDIMER 1.28*    Radiology/Studies:  CT Angio Chest PE W/Cm &/Or Wo Cm Result Date: 05/13/2023 CLINICAL DATA:  Pulmonary embolism (PE) suspected, high prob. Shortness of breath. EXAM: CT ANGIOGRAPHY CHEST WITH CONTRAST TECHNIQUE: Multidetector CT imaging of the chest was performed using the standard protocol during bolus administration of intravenous contrast. Multiplanar CT image reconstructions and MIPs were obtained to evaluate the vascular anatomy. RADIATION DOSE REDUCTION: This exam was performed according to the departmental dose-optimization program which includes automated exposure control, adjustment of the mA and/or kV according to patient size and/or use of iterative reconstruction technique. CONTRAST:  75mL OMNIPAQUE  IOHEXOL 350 MG/ML SOLN COMPARISON:  CT scan chest from 03/22/2023. FINDINGS: Cardiovascular: No evidence of embolism to the proximal subsegmental pulmonary artery level. Mild cardiomegaly. Redemonstration of aneurysm of ascending aorta measuring up to 4.5 cm in diameter; however, please note the measurements are susceptible to cardiac pulsation artifact on this non cardiac gated exam. Note is also made of mild circumferential wall thickening predominantly of the ascending aorta and proximal aortic arch. There are also additional areas of focal wall thickening of the descending thoracic aorta (marked with electronic arrow sign on series 4). There is also mild periaortic fat stranding, which is new since the prior study. There is also new mild thickening of the pericardium and trace pericardial fluid, when compared to the prior CT scan. Trace amount of periaortic/pericardial recess free fluid is grossly unchanged. In the appropriate clinical setting, findings are concerning for aortitis/pericarditis. There are coronary artery calcifications, in keeping with coronary artery disease. There is left-sided 3 lead cardiac pacemaker. Mediastinum/Nodes: Markedly limited evaluation of the thyroid gland due to extensive streak artifacts from hardware. No solid / cystic mediastinal masses. The esophagus is nondistended precluding optimal assessment. There are few mildly prominent mediastinal and hilar lymph nodes, which do not meet the size criteria for lymphadenopathy and appear grossly similar to the prior study, favoring benign etiology. No axillary lymphadenopathy by size criteria. Lungs/Pleura: The central tracheo-bronchial tree is patent. There is new trace left pleural effusion with associated minimal atelectatic changes in the left lung lower lobe. No mass, consolidation or pneumothorax. No right pleural effusion. No suspicious lung nodules. Upper Abdomen: Moderate volume layering calcified gallstones noted without  imaging signs of acute cholecystitis. Remaining visualized upper abdominal viscera within normal limits. Musculoskeletal: The visualized soft tissues of the chest wall are grossly unremarkable.  No suspicious osseous lesions. There are mild multilevel degenerative changes in the visualized spine. Bilateral shoulder arthroplasty noted. Review of the MIP images confirms the above findings. IMPRESSION: *No evidence of embolism to the proximal subsegmental pulmonary artery level. *There is mild circumferential wall thickening predominantly of the ascending aorta and proximal aortic arch with patchy areas of thickening in the descending thoracic aorta, as described above, concerning for aortitis. *There is also new mild pericardial thickening/trace pericardial fluid, concerning for pericarditis. *There is new left trace pleural effusion. *Multiple other nonacute observations, as described above. Aortic aneurysm NOS (ICD10-I71.9). Electronically Signed   By: Jules Schick M.D.   On: 05/13/2023 11:40   DG Chest Port 1 View Result Date: 05/13/2023 CLINICAL DATA:  Cough, shortness of breath EXAM: PORTABLE CHEST 1 VIEW COMPARISON:  03/16/2023 FINDINGS: Left-sided implanted cardiac device remains in place. Stable mild cardiomegaly. No focal airspace consolidation, pleural effusion, or pneumothorax. Bilateral shoulder prostheses. IMPRESSION: No active disease. Electronically Signed   By: Duanne Guess D.O.   On: 05/13/2023 09:30    Assessment and Plan:   Acute aortitis Acute pericarditis Ascending aorta aneurysm 4.5 cm Chronic systolic heart failure s/p CRT-D NICM LVEF less than 20% Thyroidectomy in 11/24 with residual small thyroid tissue with cancer cells pending RAI ablation in Jan 2025 Bladder cancer s/p L nephrectomy and cystoprostatectomy with ileal conduit    -Patient underwent thyroidectomy at [facility] in 04/2023 after which he started to have daily chest pains with any activity like deep breathing,  coughing, singing loud in the church, straining etc. EKG showed NSR, first-degree AV block and no ischemia.  First-degree AV block is old.  Hs troponins within normal limits. CT angio chest showed no evidence of PE but there was evidence of circumferential wall thickening of the ascending aorta, proximal aortic arch and patchy areas of thickening in the descending thoracic aorta consistent with aortitis, there was also mild pericardial thickening/trace pericardial fluid concerning for pericarditis. ESR and CRP are pending. Obtain blood cultures and echocardiogram.  Start high-dose aspirin 1000 mg TID x 2 weeks followed by tapering, colchicine 0.6 mg once daily and omeprazole 20 mg once daily.     For questions or updates, please contact [facility] Please consult [email or web address] for contact info under Cardiology/STEMI.   Signed, Herbert Deaner, MD 05/13/2023 4:35 PM

## 2023-05-13 NOTE — ED Notes (Signed)
Pt given water as pt may eat/drink per MD

## 2023-05-13 NOTE — Assessment & Plan Note (Addendum)
Urostomy status.

## 2023-05-13 NOTE — ED Notes (Signed)
Pt states he will also need more urostomy bags if he will be admitted, [facility] is looking for urostomy bags for pt at this time

## 2023-05-13 NOTE — ED Notes (Signed)
Pt eating dinner tray at this time 

## 2023-05-13 NOTE — H&P (Signed)
History and Physical    Billy Rasmussen XBM:[id number] DOB: 1946/01/29 DOA: 05/13/2023  PCP: Raliegh Ip, DO   Patient coming from: Home  I have personally briefly reviewed patient's old medical records in [facility]  Chief Complaint: Chest pain, difficulty breathing.  HPI: Billy Rasmussen is a 77 y.o. male with medical history significant for systolic and diastolic CHF, bladder cancer, hypertension, OSA, renal mass, AICD. Patient presented to the ED with complaints of difficulty breathing and chest pain over the past month.  Patient underwent thyroidectomy at [facility] 11/24, and symptoms subsequently started.  Chest pain is worse when he takes a deep breath, when he bends over, when he straining, when he is coughing.  He has no chest pain when he is just sitting and resting.  No leg swelling.  No vomiting no loose stools.   ED Course: Stable vitals.  D-dimer elevated 1.28.  Subsequent CTA chest no PE, mild circumferential wall thickening predominantly of the ascending aorta concerning for aortitis, new mild pericardial thickening concerning for pericarditis. Cardiology was consulted-check ESR, CRP, obtain blood cultures and echo.  Start aspirin 1000 mg 3 times daily for 2 weeks, followed by tapering, colchicine 0.6 mg once daily and omeprazole 20 mg once daily.  Review of Systems: As per HPI all other systems reviewed and negative.  Past Medical History:  Diagnosis Date   AICD (automatic cardioverter/defibrillator) present    [employer]ott/St Jude   Arthritis    knees, shoulders, elbows   Bladder cancer [facility])    urologist-  dr Mena Goes   CHF (congestive heart failure) (HCC)    Diverticulosis of colon    Fibromyalgia    GERD (gastroesophageal reflux disease)    History of diverticulitis of colon    History of gastric ulcer    due to aleve   Hypertension    Lower urinary tract symptoms (LUTS)    OSA (obstructive sleep apnea)    Wears CPAP nightly   PONV (postoperative  nausea and vomiting)    Psoriasis    Sepsis (HCC)    january 2021   Tinnitus    right ear more, has tranmitter in right ear removable at hs   Wears glasses    Wears partial dentures     Past Surgical History:  Procedure Laterality Date   APPENDECTOMY     BIV ICD INSERTION CRT-D N/A 03/01/2023   Procedure: BIV ICD INSERTION CRT-D;  Surgeon: Maurice Small, MD;  [facility];  Service: Cardiovascular;  Laterality: N/A;   COLECTOMY W/ COLOSTOMY  1996   W/   APPENDECTOMY   COLONOSCOPY N/A 05/11/2018   Procedure: COLONOSCOPY;  Surgeon: Corbin Ade, MD;  Location: [facility];  Service: Endoscopy;  Laterality: N/A;  9:30   COLOSTOMY TAKEDOWN  1996   CYSTOSCOPY WITH BIOPSY N/A 12/05/2013   Procedure: CYSTO BLADDER BIOPSY AND FULGERATION;  Surgeon: Jerilee Field, MD;  Location[facility];  Service: Urology;  Laterality: N/A;   CYSTOSCOPY WITH BIOPSY Bilateral 11/13/2014   Procedure: CYSTOSCOPY WITH  BLADDER BIOPSY FULGERATION AND BILATERAL RETROGRADE PYELOGRAMS;  Surgeon: Jerilee Field, MD;  Location: [facility];  Service: Urology;  Laterality: Bilateral;   CYSTOSCOPY WITH FULGERATION N/A 01/18/2018   Procedure: CYSTOSCOPY WITH FULGERATION/ BLADDER BIOPSY;  Surgeon: Jerilee Field, MD;  Location: [facility];  Service: Urology;  Laterality: N/A;   CYSTOSCOPY WITH INJECTION N/A 11/09/2018   Procedure: CYSTOSCOPY WITH INJECTION OF INDOCYANINE GREEN  DYE;  Surgeon: Sebastian Ache, MD;  Location: [facility];  Service: Urology;  Laterality: N/A;   CYSTOSCOPY WITH INSERTION OF UROLIFT N/A 01/18/2018   Procedure: CYSTOSCOPY WITH INSERTION OF UROLIFT;  Surgeon: Jerilee Field, MD;  Location: [facility];  Service: Urology;  Laterality: N/A;   CYSTOSCOPY/URETEROSCOPY/HOLMIUM LASER Left 02/17/2019   Procedure: URETEROSCOPY WITH BALLOON DILATION  AND NEPHROSTOGRAM;  Surgeon: Sebastian Ache, MD;  Location: [facility];  Service: Urology;  Laterality: Left;  1 HR   EXCISION RIGHT UPPER ARM LIPOMA  2005   HEMORROIDECTOMY     INGUINAL HERNIA REPAIR Left 1984   IR NEPHROSTOMY PLACEMENT LEFT  01/05/2019   KNEE ARTHROSCOPY Left X3  LAST ONE  2002   ORIF LEFT HUMEROUS FX  1976   POLYPECTOMY  05/11/2018   Procedure: POLYPECTOMY;  Surgeon: Corbin Ade, MD;  [facility];  Service: Endoscopy;;   PROSTATE SURGERY     RIGHT HEART CATH AND CORONARY ANGIOGRAPHY N/A 10/09/2022   Procedure: RIGHT HEART CATH AND CORONARY ANGIOGRAPHY;  Surgeon: [name], MD;  [facility];  Service: Cardiovascular;  Laterality: N/A;   ROBOT ASSISTED LAPAROSCOPIC NEPHRECTOMY Left 07/14/2019   Procedure: XI ROBOTIC ASSISTED LAPAROSCOPIC RETROPERITONEAL NEPHRECTOMY;  Surgeon: Sebastian Ache, MD;  Location: [facility];  Service: Urology;  Laterality: Left;  3 HRS   THYROIDECTOMY Bilateral 04/14/2023   Procedure: TOTAL THYROIDECTOMY;  Surgeon: [name], MD;  Location[facility];  Service: ENT;  Laterality: Bilateral;   TONSILLECTOMY  AS CHILD   TOTAL KNEE ARTHROPLASTY Left 2006   REVISION 2007  (AFTER I & D WITH ANTIBIOTIC SPACER PROCEDURE FOR STEPH INFECTION)   TOTAL KNEE ARTHROPLASTY Right 03/30/2016   Procedure: RIGHT TOTAL KNEE ARTHROPLASTY;  Surgeon: Durene Romans, MD;  Location: [facility];  Service: Orthopedics;  Laterality: Right;   TOTAL SHOULDER ARTHROPLASTY Right 04/06/2013   Procedure: RIGHT TOTAL SHOULDER ARTHROPLASTY;  Surgeon: Senaida Lange, MD;  Location: [facility];  Service: Orthopedics;  Laterality: Right;   TOTAL SHOULDER ARTHROPLASTY Left 07/20/2013   Procedure: LEFT TOTAL SHOULDER ARTHROPLASTY;  Surgeon: Senaida Lange, MD;  Location: [facility];  Service: Orthopedics;  Laterality: Left;   TRANSURETHRAL RESECTION OF BLADDER TUMOR N/A 11/12/2015   Procedure: TRANSURETHRAL RESECTION OF BLADDER TUMOR (TURBT);  Surgeon: Jerilee Field, MD;  Location: [facility];  Service:  Urology;  Laterality: N/A;   TRANSURETHRAL RESECTION OF BLADDER TUMOR WITH GYRUS (TURBT-GYRUS) N/A 12/05/2013   Procedure: TRANSURETHRAL RESECTION OF BLADDER TUMOR WITH GYRUS (TURBT-GYRUS);  Surgeon: Jerilee Field, MD;  Location: [facility];  Service: Urology;  Laterality: N/A;     reports that he quit smoking about 25 years ago. His smoking use included cigarettes. He started smoking about 65 years ago. He has a 80 pack-year smoking history. He has never used smokeless tobacco. He reports that he does not drink alcohol and does not use drugs.  No Known Allergies  Family History  Problem Relation Age of Onset   Alzheimer's disease Mother    Heart attack Father    Heart disease Father    Irregular heart beat Brother        DEFIB.   Congestive Heart Failure Brother    Diabetes Daughter    Sleep apnea Neg Hx    Prior to Admission medications   Medication Sig Start Date End Date Taking? Authorizing Provider  acetaminophen (TYLENOL) 650 MG CR tablet Take 1,300 mg by mouth daily  as needed for pain.   Yes [provider]  acetaminophen-codeine (TYLENOL #3) 300-30 MG tablet Take 1 tablet by mouth every 8 (eight) hours as needed for moderate pain. 01/22/23  Yes Gottschalk, Ashly M, DO  Albuterol-Budesonide (AIRSUPRA) 90-80 MCG/ACT AERO Inhale 2 puffs into the lungs every 6 (six) hours as needed (shortness of breath/ wheeze). 03/16/23  Yes Gottschalk, Kathie Rhodes M, DO  atorvastatin (LIPITOR) 20 MG tablet Take 1 tablet (20 mg total) by mouth daily. 10/23/22 05/13/23 Yes Little Ishikawa, MD  fluticasone (FLONASE) 50 MCG/ACT nasal spray Place 1 spray into both nostrils daily. 02/03/23  Yes [provider]  furosemide (LASIX) 20 MG tablet Take 1 tablet (20 mg) daily as needed if you gain more than 3 pounds in 1 day or 5 pounds in 1 week. 10/29/22  Yes Robbie Lis M, PA-C  levothyroxine (SYNTHROID) 100 MCG tablet Take 1 tablet (100 mcg total) by mouth daily.  04/15/23  Yes [name], MD  losartan (COZAAR) 25 MG tablet Take 1 tablet (25 mg total) by mouth daily. 12/14/22 05/13/23 Yes Lennette Bihari, MD  metoprolol succinate (TOPROL-XL) 25 MG 24 hr tablet Take 1 tablet (25 mg total) by mouth daily. 12/14/22  Yes Delynn Flavin M, DO  Multiple Vitamin (MULTIVITAMIN) capsule Take 1 capsule by mouth daily.   Yes [provider]  polyvinyl alcohol (LIQUIFILM TEARS) 1.4 % ophthalmic solution Place 1 drop into both eyes as needed for dry eyes. 05/16/19  Yes Sheth, Devam P, MD  levothyroxine (SYNTHROID) 25 MCG tablet Take 1 tablet (25 mcg total) by mouth daily before breakfast. Take on an empty stomach with water only.  Wait to consume other foods/ drinks/ meds for 30 minutes. 08/24/22   Raliegh Ip, DO    Physical Exam: Vitals:   05/13/23 1430 05/13/23 1500 05/13/23 1515 05/13/23 1616  BP: 124/69 132/66 138/66   Pulse: 71 71 73   Resp: 19 19 19    Temp:    98.5 F (36.9 C)  TempSrc:    Oral  SpO2: 94% 96% 96%     Constitutional: NAD, calm, comfortable Vitals:   05/13/23 1430 05/13/23 1500 05/13/23 1515 05/13/23 1616  BP: 124/69 132/66 138/66   Pulse: 71 71 73   Resp: 19 19 19    Temp:    98.5 F (36.9 C)  TempSrc:    Oral  SpO2: 94% 96% 96%    Eyes: PERRL, lids and conjunctivae normal ENMT: Mucous membranes are moist.  Neck: normal, supple, no masses, no thyromegaly Respiratory: clear to auscultation bilaterally, no wheezing, no crackles. Normal respiratory effort. No accessory muscle use.  Cardiovascular: Regular rate and rhythm, no murmurs / rubs / gallops. No extremity edema.  Extremities warm. Abdomen: no tenderness, no masses palpated. No hepatosplenomegaly. Bowel sounds positive.  Urostomy bag in place. Musculoskeletal: no clubbing / cyanosis. No joint deformity upper and lower extremities.  Skin: no rashes, lesions, ulcers. No induration Neurologic: No facial asymmetry, moving extremity spontaneously, speech  fluent Psychiatric: Normal judgment and insight. Alert and oriented x 3. Normal mood.   Labs on Admission: I have personally reviewed following labs and imaging studies  CBC: Recent Labs  Lab 05/13/23 0850  WBC 8.1  NEUTROABS 6.1  HGB 14.3  HCT 42.2  MCV 91.7  PLT 179   Basic Metabolic Panel: Recent Labs  Lab 05/13/23 0850  NA 135  K 3.8  CL 101  CO2 24  GLUCOSE 88  BUN 22  CREATININE 1.32*  CALCIUM 9.3    Radiological Exams on Admission: CT Angio Chest PE W/Cm &/Or Wo Cm Result Date: 05/13/2023 CLINICAL DATA:  Pulmonary embolism (PE) suspected, high prob. Shortness of breath. EXAM: CT ANGIOGRAPHY CHEST WITH CONTRAST TECHNIQUE: Multidetector CT imaging of the chest was performed using the standard protocol during bolus administration of intravenous contrast. Multiplanar CT image reconstructions and MIPs were obtained to evaluate the vascular anatomy. RADIATION DOSE REDUCTION: This exam was performed according to the departmental dose-optimization program which includes automated exposure control, adjustment of the mA and/or kV according to patient size and/or use of iterative reconstruction technique. CONTRAST:  75mL OMNIPAQUE IOHEXOL 350 MG/ML SOLN COMPARISON:  CT scan chest from 03/22/2023. FINDINGS: Cardiovascular: No evidence of embolism to the proximal subsegmental pulmonary artery level. Mild cardiomegaly. Redemonstration of aneurysm of ascending aorta measuring up to 4.5 cm in diameter; however, please note the measurements are susceptible to cardiac pulsation artifact on this non cardiac gated exam. Note is also made of mild circumferential wall thickening predominantly of the ascending aorta and proximal aortic arch. There are also additional areas of focal wall thickening of the descending thoracic aorta (marked with electronic arrow sign on series 4). There is also mild periaortic fat stranding, which is new since the prior study. There is also new mild thickening of the  pericardium and trace pericardial fluid, when compared to the prior CT scan. Trace amount of periaortic/pericardial recess free fluid is grossly unchanged. In the appropriate clinical setting, findings are concerning for aortitis/pericarditis. There are coronary artery calcifications, in keeping with coronary artery disease. There is left-sided 3 lead cardiac pacemaker. Mediastinum/Nodes: Markedly limited evaluation of the thyroid gland due to extensive streak artifacts from hardware. No solid / cystic mediastinal masses. The esophagus is nondistended precluding optimal assessment. There are few mildly prominent mediastinal and hilar lymph nodes, which do not meet the size criteria for lymphadenopathy and appear grossly similar to the prior study, favoring benign etiology. No axillary lymphadenopathy by size criteria. Lungs/Pleura: The central tracheo-bronchial tree is patent. There is new trace left pleural effusion with associated minimal atelectatic changes in the left lung lower lobe. No mass, consolidation or pneumothorax. No right pleural effusion. No suspicious lung nodules. Upper Abdomen: Moderate volume layering calcified gallstones noted without imaging signs of acute cholecystitis. Remaining visualized upper abdominal viscera within normal limits. Musculoskeletal: The visualized soft tissues of the chest wall are grossly unremarkable. No suspicious osseous lesions. There are mild multilevel degenerative changes in the visualized spine. Bilateral shoulder arthroplasty noted. Review of the MIP images confirms the above findings. IMPRESSION: *No evidence of embolism to the proximal subsegmental pulmonary artery level. *There is mild circumferential wall thickening predominantly of the ascending aorta and proximal aortic arch with patchy areas of thickening in the descending thoracic aorta, as described above, concerning for aortitis. *There is also new mild pericardial thickening/trace pericardial fluid,  concerning for pericarditis. *There is new left trace pleural effusion. *Multiple other nonacute observations, as described above. Aortic aneurysm NOS (ICD10-I71.9). Electronically Signed   By: Jules Schick M.D.   On: 05/13/2023 11:40   DG Chest Port 1 View Result Date: 05/13/2023 CLINICAL DATA:  Cough, shortness of breath EXAM: PORTABLE CHEST 1 VIEW COMPARISON:  03/16/2023 FINDINGS: Left-sided implanted cardiac device remains in place. Stable mild cardiomegaly. No focal airspace consolidation, pleural effusion, or pneumothorax. Bilateral shoulder prostheses. IMPRESSION: No active disease. Electronically Signed   By: Duanne Guess D.O.   On: 05/13/2023 09:30    EKG: Independently reviewed.  Sinus rhythm.  First-degree AV block.  Rate 71, QTc 532.  Assessment/Plan Principal Problem:   Chest pain Active Problems:   Essential hypertension   Bladder cancer (HCC)   OSA (obstructive sleep apnea)   Acquired hypothyroidism   HFrEF (heart failure with reduced ejection fraction) (HCC)  Assessment and Plan: * Chest pain - Atypical chest pains.  Pleuritic and positional.  D-dimer elevated 1.28.   Subsequent CTA chest no PE, mild circumferential wall thickening predominantly of the ascending aorta concerning for aortitis, new mild pericardial thickening concerning for pericarditis. - Cardiology was consulted-check ESR, CRP, obtain blood cultures and echo.  -  Start aspirin 1000 mg 3 times daily for 2 weeks, followed by tapering, colchicine 0.6 mg once daily and omeprazole 20 mg once daily. -Resume Lipitor,  HFrEF (heart failure with reduced ejection fraction) (HCC) Stable and compensated.  Last echo 12/2022 EF of less than 20%. -500 bolus given in ED -Resume Lasix, metoprolol  Acquired hypothyroidism Status post thyroidectomy for thyroid cancer at [facility] 04/2023.  He is to follow-up with endocrinology. -Resume Synthroid  OSA (obstructive sleep apnea) CPAP nightly  Bladder cancer  (HCC) Urostomy status.  Essential hypertension Stable. -Resume metoprolol,  -Resume Lasix -Hold losartan for contrast exposure   Prolonged QTc 532.  Potassium 3.8 -Check magnesium  DVT prophylaxis: Lovenox Code Status: FULL code-confirmed with patient at bedside Family Communication: none at bedside Disposition Plan: ~ 2 days Consults called: Cardiology Admission status:  Obs tele    Author: Onnie Boer, MD 05/13/2023 7:23 PM  For on call review [email or web address].

## 2023-05-13 NOTE — Assessment & Plan Note (Addendum)
-   Atypical chest pains.  Pleuritic and positional.  D-dimer elevated 1.28.   Subsequent CTA chest no PE, mild circumferential wall thickening predominantly of the ascending aorta concerning for aortitis, new mild pericardial thickening concerning for pericarditis. - Cardiology was consulted-check ESR, CRP, obtain blood cultures and echo.  -  Start aspirin 1000 mg 3 times daily for 2 weeks, followed by tapering, colchicine 0.6 mg once daily and omeprazole 20 mg once daily. -Resume Lipitor,

## 2023-05-14 ENCOUNTER — Observation Stay (HOSPITAL_BASED_OUTPATIENT_CLINIC_OR_DEPARTMENT_OTHER): Payer: Medicare HMO

## 2023-05-14 ENCOUNTER — Other Ambulatory Visit (HOSPITAL_COMMUNITY): Payer: Self-pay | Admitting: *Deleted

## 2023-05-14 DIAGNOSIS — Z95810 Presence of automatic (implantable) cardiac defibrillator: Secondary | ICD-10-CM | POA: Diagnosis not present

## 2023-05-14 DIAGNOSIS — I300 Acute nonspecific idiopathic pericarditis: Secondary | ICD-10-CM

## 2023-05-14 DIAGNOSIS — I5022 Chronic systolic (congestive) heart failure: Secondary | ICD-10-CM | POA: Diagnosis not present

## 2023-05-14 DIAGNOSIS — R079 Chest pain, unspecified: Secondary | ICD-10-CM | POA: Diagnosis not present

## 2023-05-14 DIAGNOSIS — I309 Acute pericarditis, unspecified: Secondary | ICD-10-CM | POA: Diagnosis not present

## 2023-05-14 DIAGNOSIS — I428 Other cardiomyopathies: Secondary | ICD-10-CM | POA: Diagnosis not present

## 2023-05-14 DIAGNOSIS — I3139 Other pericardial effusion (noninflammatory): Secondary | ICD-10-CM | POA: Diagnosis not present

## 2023-05-14 DIAGNOSIS — I776 Arteritis, unspecified: Secondary | ICD-10-CM | POA: Diagnosis not present

## 2023-05-14 LAB — ECHOCARDIOGRAM LIMITED
Area-P 1/2: 1.88 cm2
Calc EF: 29.2 %
Height: 66 in
S' Lateral: 4.8 cm
Single Plane A2C EF: 25 %
Single Plane A4C EF: 27.9 %
Weight: 3481.5 [oz_av]

## 2023-05-14 MED ORDER — PERFLUTREN LIPID MICROSPHERE
1.0000 mL | INTRAVENOUS | Status: AC | PRN
  Administered 2023-05-14: 8 mL via INTRAVENOUS

## 2023-05-14 NOTE — Plan of Care (Signed)

## 2023-05-14 NOTE — Progress Notes (Signed)
Progress Note  Patient Name: Billy Rasmussen Date of Encounter: 05/14/2023  Primary Cardiologist: Little Ishikawa, MD  Subjective   No acute events overnight.  Continues to have chest pains.  Inpatient Medications    Scheduled Meds:  aspirin  975 mg Oral TID   atorvastatin  20 mg Oral Daily   colchicine  0.6 mg Oral Daily   enoxaparin (LOVENOX) injection  40 mg Subcutaneous Q24H   furosemide  20 mg Oral QHS   levothyroxine  100 mcg Oral QAC breakfast   metoprolol succinate  25 mg Oral Daily   pantoprazole  40 mg Oral Daily   Continuous Infusions:  PRN Meds: acetaminophen **OR** acetaminophen, ipratropium-albuterol, ondansetron **OR** ondansetron (ZOFRAN) IV, polyethylene glycol   Vital Signs    Vitals:   05/13/23 1926 05/14/23 0101 05/14/23 0534 05/14/23 0911  BP:  109/67 122/73 123/75  Pulse:  (!) 102 60 65  Resp:  20 20   Temp:  98 F (36.7 C) (!) 97.5 F (36.4 C)   TempSrc:   Oral   SpO2: 97% 100% 100%   Weight:      Height:        Intake/Output Summary (Last 24 hours) at 05/14/2023 1014 Last data filed at 05/14/2023 0600 Gross per 24 hour  Intake --  Output 1000 ml  Net -1000 ml   Filed Weights   05/13/23 1852  Weight: 98.7 kg    Telemetry     Personally reviewed.  Paced rhythm  ECG    Not performed today  Physical Exam   GEN: No acute distress.   Neck: No JVD. Cardiac: RRR, no murmur, rub, or gallop.  Respiratory: Nonlabored. Clear to auscultation bilaterally. GI: Soft, nontender, bowel sounds present. MS: No edema; No deformity. Neuro:  Nonfocal. Psych: Alert and oriented x 3. Normal affect.  Labs    Chemistry Recent Labs  Lab 05/13/23 0850  NA 135  K 3.8  CL 101  CO2 24  GLUCOSE 88  BUN 22  CREATININE 1.32*  CALCIUM 9.3  GFRNONAA 56*  ANIONGAP 10     Hematology Recent Labs  Lab 05/13/23 0850  WBC 8.1  RBC 4.60  HGB 14.3  HCT 42.2  MCV 91.7  MCH 31.1  MCHC 33.9  RDW 13.7  PLT 179    Cardiac  Enzymes Recent Labs  Lab 05/13/23 0850 05/13/23 1129  TROPONINIHS 13 13    BNP Recent Labs  Lab 05/13/23 0850  BNP 88.0     DDimer Recent Labs  Lab 05/13/23 0850  DDIMER 1.28*     Radiology    CT Angio Chest PE W/Cm &/Or Wo Cm Result Date: 05/13/2023 CLINICAL DATA:  Pulmonary embolism (PE) suspected, high prob. Shortness of breath. EXAM: CT ANGIOGRAPHY CHEST WITH CONTRAST TECHNIQUE: Multidetector CT imaging of the chest was performed using the standard protocol during bolus administration of intravenous contrast. Multiplanar CT image reconstructions and MIPs were obtained to evaluate the vascular anatomy. RADIATION DOSE REDUCTION: This exam was performed according to the departmental dose-optimization program which includes automated exposure control, adjustment of the mA and/or kV according to patient size and/or use of iterative reconstruction technique. CONTRAST:  75mL OMNIPAQUE IOHEXOL 350 MG/ML SOLN COMPARISON:  CT scan chest from 03/22/2023. FINDINGS: Cardiovascular: No evidence of embolism to the proximal subsegmental pulmonary artery level. Mild cardiomegaly. Redemonstration of aneurysm of ascending aorta measuring up to 4.5 cm in diameter; however, please note the measurements are susceptible to cardiac pulsation artifact on this  non cardiac gated exam. Note is also made of mild circumferential wall thickening predominantly of the ascending aorta and proximal aortic arch. There are also additional areas of focal wall thickening of the descending thoracic aorta (marked with electronic arrow sign on series 4). There is also mild periaortic fat stranding, which is new since the prior study. There is also new mild thickening of the pericardium and trace pericardial fluid, when compared to the prior CT scan. Trace amount of periaortic/pericardial recess free fluid is grossly unchanged. In the appropriate clinical setting, findings are concerning for aortitis/pericarditis. There are  coronary artery calcifications, in keeping with coronary artery disease. There is left-sided 3 lead cardiac pacemaker. Mediastinum/Nodes: Markedly limited evaluation of the thyroid gland due to extensive streak artifacts from hardware. No solid / cystic mediastinal masses. The esophagus is nondistended precluding optimal assessment. There are few mildly prominent mediastinal and hilar lymph nodes, which do not meet the size criteria for lymphadenopathy and appear grossly similar to the prior study, favoring benign etiology. No axillary lymphadenopathy by size criteria. Lungs/Pleura: The central tracheo-bronchial tree is patent. There is new trace left pleural effusion with associated minimal atelectatic changes in the left lung lower lobe. No mass, consolidation or pneumothorax. No right pleural effusion. No suspicious lung nodules. Upper Abdomen: Moderate volume layering calcified gallstones noted without imaging signs of acute cholecystitis. Remaining visualized upper abdominal viscera within normal limits. Musculoskeletal: The visualized soft tissues of the chest wall are grossly unremarkable. No suspicious osseous lesions. There are mild multilevel degenerative changes in the visualized spine. Bilateral shoulder arthroplasty noted. Review of the MIP images confirms the above findings. IMPRESSION: *No evidence of embolism to the proximal subsegmental pulmonary artery level. *There is mild circumferential wall thickening predominantly of the ascending aorta and proximal aortic arch with patchy areas of thickening in the descending thoracic aorta, as described above, concerning for aortitis. *There is also new mild pericardial thickening/trace pericardial fluid, concerning for pericarditis. *There is new left trace pleural effusion. *Multiple other nonacute observations, as described above. Aortic aneurysm NOS (ICD10-I71.9). Electronically Signed   By: Jules Schick M.D.   On: 05/13/2023 11:40   DG Chest Port 1  View Result Date: 05/13/2023 CLINICAL DATA:  Cough, shortness of breath EXAM: PORTABLE CHEST 1 VIEW COMPARISON:  03/16/2023 FINDINGS: Left-sided implanted cardiac device remains in place. Stable mild cardiomegaly. No focal airspace consolidation, pleural effusion, or pneumothorax. Bilateral shoulder prostheses. IMPRESSION: No active disease. Electronically Signed   By: Duanne Guess D.O.   On: 05/13/2023 09:30    Assessment & Plan   Acute aortitis Acute pericarditis Ascending aorta aneurysm 4.5 cm Chronic systolic heart failure s/p CRT-D NICM LVEF less than 20% Thyroidectomy in 11/24 with residual small thyroid tissue with cancer cells pending RAI ablation in Jan 2025 Bladder cancer s/p L nephrectomy and cystoprostatectomy with ileal conduit    -Patient underwent thyroidectomy at [facility] in 04/2023 after which she started to have daily chest pains with any activity like deep breathing, coughing, singing loud in the church, straining etc. EKG showed NSR, first-degree AV block and no ischemia. AV block is old. Hs troponins within normal limits. CT angio chest showed no evidence of PE but there was evidence of circumferential wall thickening of the ascending aorta, proximal aortic arch and patchy areas of thickening in the descending thoracic aorta consistent with aortitis, there was also mild pericardial thickening/trace pericardial fluid concerning for pericarditis. ESR and CRP are elevated, 25 and 1.7 respectively.  Currently on aspirin 1000  mg TID which will need to be continued for 2 weeks after which aspirin will need to be tapered (aspirin 650 mg TID x 1 week followed by aspirin 325 mg TID x 1 week and then discontinue). Continue colchicine 0.6 mg once daily x 3 months. Exercise restriction for three months. Blood cultures showed no growth till date. Obtain 2D Echocardiogram.     Signed, Aizlynn Digilio P Takia Runyon, MD  05/14/2023, 10:14 AM

## 2023-05-14 NOTE — Care Management Obs Status (Signed)
MEDICARE OBSERVATION STATUS NOTIFICATION   Patient Details  Name: Billy Rasmussen MRN: [id number] Date of Birth: [birth year]   Medicare Observation Status Notification Given:  Yes    Corey Harold 05/14/2023, 4:24 PM

## 2023-05-14 NOTE — Progress Notes (Signed)
PROGRESS NOTE    KIONTE SURMAN  ZOX:[id number] DOB: 1946-02-06 DOA: 05/13/2023 PCP: Raliegh Ip, DO   Brief Narrative:    KEATIN SHOREY is a 77 y.o. male with medical history significant for systolic and diastolic CHF, bladder cancer, hypertension, OSA, renal mass, AICD. Patient presented to the ED with complaints of difficulty breathing and chest pain over the past month.  Patient underwent thyroidectomy at [facility] 11/24, and symptoms subsequently started.  Patient was admitted for evaluation of chest pain that appears to be related to acute pericarditis/aortitis and he has been started on high-dose aspirin as well as colchicine per cardiology.  2D echocardiogram pending.  Assessment & Plan:   Principal Problem:   Chest pain Active Problems:   Essential hypertension   Bladder cancer (HCC)   OSA (obstructive sleep apnea)   Acquired hypothyroidism   HFrEF (heart failure with reduced ejection fraction) (HCC)  Assessment and Plan:   Chest pain secondary to acute aortitis/pericarditis -Continue high dose aspirin as well as colchicine with plans to taper upon discharge -Appreciate cardiology recommendations -2D echocardiogram pending   HFrEF (heart failure with reduced ejection fraction) (HCC) Stable and compensated.  Last echo 12/2022 EF of less than 20%. -500 bolus given in ED -Resume Lasix, metoprolol   Acquired hypothyroidism Status post thyroidectomy for thyroid cancer at [facility] 04/2023.  He is to follow-up with endocrinology. -Resume Synthroid   OSA (obstructive sleep apnea) CPAP nightly   Bladder cancer (HCC) Urostomy status.   Essential hypertension Stable. -Resume metoprolol,  -Resume Lasix -Hold losartan for contrast exposure   Obesity class II -BMI 35.12  DVT prophylaxis:Lovenox Code Status: Full Family Communication: None at bedside Disposition Plan:  Status is: Observation The patient will require care spanning > 2 midnights and should  be moved to inpatient because: Need for close monitoring.   Consultants:  Cardiology  Procedures:  None  Antimicrobials:  None   Subjective: Patient seen and evaluated today with ongoing chest pain noted.  Denies any shortness of breath or palpitations.  Objective: Vitals:   05/13/23 1926 05/14/23 0101 05/14/23 0534 05/14/23 0911  BP:  109/67 122/73 123/75  Pulse:  (!) 102 60 65  Resp:  20 20   Temp:  98 F (36.7 C) (!) 97.5 F (36.4 C)   TempSrc:   Oral   SpO2: 97% 100% 100%   Weight:      Height:        Intake/Output Summary (Last 24 hours) at 05/14/2023 1304 Last data filed at 05/14/2023 0600 Gross per 24 hour  Intake --  Output 1000 ml  Net -1000 ml   Filed Weights   05/13/23 1852  Weight: 98.7 kg    Examination:  General exam: Appears calm and comfortable  Respiratory system: Clear to auscultation. Respiratory effort normal. Cardiovascular system: S1 & S2 heard, RRR.  Gastrointestinal system: Abdomen is soft Central nervous system: Alert and awake Extremities: No edema Skin: No significant lesions noted Psychiatry: Flat affect.    Data Reviewed: I have personally reviewed following labs and imaging studies  CBC: Recent Labs  Lab 05/13/23 0850  WBC 8.1  NEUTROABS 6.1  HGB 14.3  HCT 42.2  MCV 91.7  PLT 179   Basic Metabolic Panel: Recent Labs  Lab 05/13/23 0850 05/13/23 1129  NA 135  --   K 3.8  --   CL 101  --   CO2 24  --   GLUCOSE 88  --   BUN 22  --  CREATININE 1.32*  --   CALCIUM 9.3  --   MG  --  2.1   GFR: Estimated Creatinine Clearance: 53.2 mL/min (A) (by C-G formula based on SCr of 1.32 mg/dL (H)). Liver Function Tests: No results for input(s): "AST", "ALT", "ALKPHOS", "BILITOT", "PROT", "ALBUMIN" in the last 168 hours. No results for input(s): "LIPASE", "AMYLASE" in the last 168 hours. No results for input(s): "AMMONIA" in the last 168 hours. Coagulation Profile: No results for input(s): "INR", "PROTIME" in the  last 168 hours. Cardiac Enzymes: No results for input(s): "CKTOTAL", "CKMB", "CKMBINDEX", "TROPONINI" in the last 168 hours. BNP (last 3 results) No results for input(s): "PROBNP" in the last 8760 hours. HbA1C: No results for input(s): "HGBA1C" in the last 72 hours. CBG: No results for input(s): "GLUCAP" in the last 168 hours. Lipid Profile: No results for input(s): "CHOL", "HDL", "LDLCALC", "TRIG", "CHOLHDL", "LDLDIRECT" in the last 72 hours. Thyroid Function Tests: No results for input(s): "TSH", "T4TOTAL", "FREET4", "T3FREE", "THYROIDAB" in the last 72 hours. Anemia Panel: No results for input(s): "VITAMINB12", "FOLATE", "FERRITIN", "TIBC", "IRON", "RETICCTPCT" in the last 72 hours. Sepsis Labs: No results for input(s): "PROCALCITON", "LATICACIDVEN" in the last 168 hours.  Recent Results (from the past 240 hours)  Blood culture (routine x 2)     Status: None (Preliminary result)   Collection Time: 05/13/23  5:05 PM   Specimen: Right Antecubital; Blood  Result Value Ref Range Status   Specimen Description RIGHT ANTECUBITAL  Final   Special Requests   Final    BOTTLES DRAWN AEROBIC AND ANAEROBIC Blood Culture adequate volume   Culture   Final    NO GROWTH < 24 HOURS Performed at [facility], [address] [place], [place] [address]    Report Status PENDING  Incomplete  Blood culture (routine x 2)     Status: None (Preliminary result)   Collection Time: 05/13/23  5:09 PM   Specimen: Left Antecubital; Blood  Result Value Ref Range Status   Specimen Description LEFT ANTECUBITAL  Final   Special Requests   Final    BOTTLES DRAWN AEROBIC ONLY Blood Culture results may not be optimal due to an inadequate volume of blood received in culture bottles   Culture   Final    NO GROWTH < 24 HOURS Performed at [facility], [address], [place], [place] [address]    Report Status PENDING  Incomplete         Radiology Studies: CT Angio Chest PE W/Cm &/Or Wo Cm Result Date:  05/13/2023 CLINICAL DATA:  Pulmonary embolism (PE) suspected, high prob. Shortness of breath. EXAM: CT ANGIOGRAPHY CHEST WITH CONTRAST TECHNIQUE: Multidetector CT imaging of the chest was performed using the standard protocol during bolus administration of intravenous contrast. Multiplanar CT image reconstructions and MIPs were obtained to evaluate the vascular anatomy. RADIATION DOSE REDUCTION: This exam was performed according to the departmental dose-optimization program which includes automated exposure control, adjustment of the mA and/or kV according to patient size and/or use of iterative reconstruction technique. CONTRAST:  75mL OMNIPAQUE IOHEXOL 350 MG/ML SOLN COMPARISON:  CT scan chest from 03/22/2023. FINDINGS: Cardiovascular: No evidence of embolism to the proximal subsegmental pulmonary artery level. Mild cardiomegaly. Redemonstration of aneurysm of ascending aorta measuring up to 4.5 cm in diameter; however, please note the measurements are susceptible to cardiac pulsation artifact on this non cardiac gated exam. Note is also made of mild circumferential wall thickening predominantly of the ascending aorta and proximal aortic arch. There are also  additional areas of focal wall thickening of the descending thoracic aorta (marked with electronic arrow sign on series 4). There is also mild periaortic fat stranding, which is new since the prior study. There is also new mild thickening of the pericardium and trace pericardial fluid, when compared to the prior CT scan. Trace amount of periaortic/pericardial recess free fluid is grossly unchanged. In the appropriate clinical setting, findings are concerning for aortitis/pericarditis. There are coronary artery calcifications, in keeping with coronary artery disease. There is left-sided 3 lead cardiac pacemaker. Mediastinum/Nodes: Markedly limited evaluation of the thyroid gland due to extensive streak artifacts from hardware. No solid / cystic mediastinal  masses. The esophagus is nondistended precluding optimal assessment. There are few mildly prominent mediastinal and hilar lymph nodes, which do not meet the size criteria for lymphadenopathy and appear grossly similar to the prior study, favoring benign etiology. No axillary lymphadenopathy by size criteria. Lungs/Pleura: The central tracheo-bronchial tree is patent. There is new trace left pleural effusion with associated minimal atelectatic changes in the left lung lower lobe. No mass, consolidation or pneumothorax. No right pleural effusion. No suspicious lung nodules. Upper Abdomen: Moderate volume layering calcified gallstones noted without imaging signs of acute cholecystitis. Remaining visualized upper abdominal viscera within normal limits. Musculoskeletal: The visualized soft tissues of the chest wall are grossly unremarkable. No suspicious osseous lesions. There are mild multilevel degenerative changes in the visualized spine. Bilateral shoulder arthroplasty noted. Review of the MIP images confirms the above findings. IMPRESSION: *No evidence of embolism to the proximal subsegmental pulmonary artery level. *There is mild circumferential wall thickening predominantly of the ascending aorta and proximal aortic arch with patchy areas of thickening in the descending thoracic aorta, as described above, concerning for aortitis. *There is also new mild pericardial thickening/trace pericardial fluid, concerning for pericarditis. *There is new left trace pleural effusion. *Multiple other nonacute observations, as described above. Aortic aneurysm NOS (ICD10-I71.9). Electronically Signed   By: Jules Schick M.D.   On: 05/13/2023 11:40   DG Chest Port 1 View Result Date: 05/13/2023 CLINICAL DATA:  Cough, shortness of breath EXAM: PORTABLE CHEST 1 VIEW COMPARISON:  03/16/2023 FINDINGS: Left-sided implanted cardiac device remains in place. Stable mild cardiomegaly. No focal airspace consolidation, pleural effusion,  or pneumothorax. Bilateral shoulder prostheses. IMPRESSION: No active disease. Electronically Signed   By: Duanne Guess D.O.   On: 05/13/2023 09:30        Scheduled Meds:  aspirin  975 mg Oral TID   atorvastatin  20 mg Oral Daily   colchicine  0.6 mg Oral Daily   enoxaparin (LOVENOX) injection  40 mg Subcutaneous Q24H   furosemide  20 mg Oral QHS   levothyroxine  100 mcg Oral QAC breakfast   metoprolol succinate  25 mg Oral Daily   pantoprazole  40 mg Oral Daily     LOS: 0 days    Time spent: 55 minutes    Andric Kerce Hoover Brunette, DO [facility]  If 7PM-7AM, please contact night-coverage [email or web address] 05/14/2023, 1:04 PM

## 2023-05-14 NOTE — Progress Notes (Signed)
   05/14/23 1053  TOC Brief Assessment  Insurance and Status Reviewed  Patient has primary care physician Yes  Home environment has been reviewed Home  Prior level of function: independent  Prior/Current Home Services No current home services  Social Drivers of Health Review SDOH reviewed no interventions necessary  Readmission risk has been reviewed Yes  Transition of care needs no transition of care needs at this time   Transition of Care Department [facility]) has reviewed patient and no TOC needs have been identified at this time. We will continue to monitor patient advancement through interdisciplinary progression rounds. If new patient transition needs arise, please place a TOC consult.

## 2023-05-15 DIAGNOSIS — Z95810 Presence of automatic (implantable) cardiac defibrillator: Secondary | ICD-10-CM | POA: Diagnosis not present

## 2023-05-15 DIAGNOSIS — C679 Malignant neoplasm of bladder, unspecified: Secondary | ICD-10-CM | POA: Diagnosis not present

## 2023-05-15 DIAGNOSIS — Z96612 Presence of left artificial shoulder joint: Secondary | ICD-10-CM | POA: Diagnosis not present

## 2023-05-15 DIAGNOSIS — Z9049 Acquired absence of other specified parts of digestive tract: Secondary | ICD-10-CM | POA: Diagnosis not present

## 2023-05-15 DIAGNOSIS — I776 Arteritis, unspecified: Secondary | ICD-10-CM | POA: Diagnosis not present

## 2023-05-15 DIAGNOSIS — Z96653 Presence of artificial knee joint, bilateral: Secondary | ICD-10-CM | POA: Diagnosis not present

## 2023-05-15 DIAGNOSIS — Z96611 Presence of right artificial shoulder joint: Secondary | ICD-10-CM | POA: Diagnosis not present

## 2023-05-15 DIAGNOSIS — E039 Hypothyroidism, unspecified: Secondary | ICD-10-CM | POA: Diagnosis not present

## 2023-05-15 DIAGNOSIS — R079 Chest pain, unspecified: Secondary | ICD-10-CM | POA: Diagnosis not present

## 2023-05-15 DIAGNOSIS — I110 Hypertensive heart disease with heart failure: Secondary | ICD-10-CM | POA: Diagnosis not present

## 2023-05-15 LAB — CBC
HCT: 42.8 % (ref 39.0–52.0)
Hemoglobin: 14.3 g/dL (ref 13.0–17.0)
MCH: 30.8 pg (ref 26.0–34.0)
MCHC: 33.4 g/dL (ref 30.0–36.0)
MCV: 92.2 fL (ref 80.0–100.0)
Platelets: 174 10*3/uL (ref 150–400)
RBC: 4.64 MIL/uL (ref 4.22–5.81)
RDW: 14 % (ref 11.5–15.5)
WBC: 5.9 10*3/uL (ref 4.0–10.5)
nRBC: 0 % (ref 0.0–0.2)

## 2023-05-15 LAB — BASIC METABOLIC PANEL
Anion gap: 11 (ref 5–15)
BUN: 26 mg/dL — ABNORMAL HIGH (ref 8–23)
CO2: 26 mmol/L (ref 22–32)
Calcium: 9.4 mg/dL (ref 8.9–10.3)
Chloride: 101 mmol/L (ref 98–111)
Creatinine, Ser: 1.49 mg/dL — ABNORMAL HIGH (ref 0.61–1.24)
GFR, Estimated: 49 mL/min — ABNORMAL LOW (ref >60)
Glucose, Bld: 87 mg/dL (ref 70–99)
Potassium: 4.1 mmol/L (ref 3.5–5.1)
Sodium: 138 mmol/L (ref 135–145)

## 2023-05-15 LAB — MAGNESIUM: Magnesium: 2.3 mg/dL (ref 1.7–2.4)

## 2023-05-15 MED ORDER — ASPIRIN 325 MG PO TABS: ORAL_TABLET | ORAL | 0 refills | Status: AC

## 2023-05-15 MED ORDER — COLCHICINE 0.6 MG PO TABS: 0.6000 mg | ORAL_TABLET | Freq: Every day | ORAL | 0 refills | Status: DC

## 2023-05-15 NOTE — Plan of Care (Signed)

## 2023-05-15 NOTE — Progress Notes (Deleted)
Called report to [facility]. Waiting on EMS for transport

## 2023-05-15 NOTE — Discharge Summary (Signed)
Physician Discharge Summary  Billy Rasmussen MVH:[id number] DOB: 1945-06-05 DOA: 05/13/2023  PCP: Raliegh Ip, DO  Admit date: 05/13/2023  Discharge date: 05/15/2023  Admitted From:Home  Disposition:  Home  Recommendations for Outpatient Follow-up:  Follow up with PCP in 1-2 weeks Follow-up with cardiology as scheduled in January Continue on aspirin with tapering dose as prescribed  Continue on colchicine for 3 months as prescribed Exercise restriction for 3 months  Home Health: None  Equipment/Devices: None  Discharge Condition:Stable  CODE STATUS: Full  Diet recommendation: Heart Healthy  Brief/Interim Summary: Billy Rasmussen is a 77 y.o. male with medical history significant for systolic and diastolic CHF, bladder cancer, hypertension, OSA, renal mass, AICD. Patient presented to the ED with complaints of difficulty breathing and chest pain over the past month.  Patient underwent thyroidectomy at [facility] 11/24, and symptoms subsequently started.  Patient was admitted for evaluation of chest pain that appears to be related to acute pericarditis/aortitis and he has been started on high-dose aspirin as well as colchicine per cardiology.  2D echocardiogram demonstrating stable LV function with LVEF 25-30% and no significant pericardial effusion.  He denies any further symptomatology and will remain on aspirin and colchicine as prescribed to complete course of treatment.  Discharge Diagnoses:  Principal Problem:   Chest pain Active Problems:   Essential hypertension   Bladder cancer (HCC)   OSA (obstructive sleep apnea)   Acquired hypothyroidism   HFrEF (heart failure with reduced ejection fraction) (HCC)  Principal discharge diagnosis: Acute aortitis/pericarditis.  Discharge Instructions  Discharge Instructions     Diet - low sodium heart healthy   Complete by: As directed    Increase activity slowly   Complete by: As directed       Allergies as of  05/15/2023   No Known Allergies      Medication List     TAKE these medications    acetaminophen 650 MG CR tablet Commonly known as: TYLENOL Take 1,300 mg by mouth daily as needed for pain.   acetaminophen-codeine 300-30 MG tablet Commonly known as: TYLENOL #3 Take 1 tablet by mouth every 8 (eight) hours as needed for moderate pain.   Airsupra 90-80 MCG/ACT Aero Generic drug: Albuterol-Budesonide Inhale 2 puffs into the lungs every 6 (six) hours as needed (shortness of breath/ wheeze).   aspirin 325 MG tablet Take 3 tablets (975 mg total) by mouth 3 (three) times daily for 12 days, THEN 2 tablets (650 mg total) 3 (three) times daily for 7 days, THEN 1 tablet (325 mg total) 3 (three) times daily for 7 days. Start taking on: May 15, 2023   atorvastatin 20 MG tablet Commonly known as: LIPITOR Take 1 tablet (20 mg total) by mouth daily.   colchicine 0.6 MG tablet Take 1 tablet (0.6 mg total) by mouth daily. Start taking on: May 16, 2023   fluticasone 50 MCG/ACT nasal spray Commonly known as: FLONASE Place 1 spray into both nostrils daily.   furosemide 20 MG tablet Commonly known as: LASIX Take 1 tablet (20 mg) daily as needed if you gain more than 3 pounds in 1 day or 5 pounds in 1 week.   levothyroxine 25 MCG tablet Commonly known as: SYNTHROID Take 1 tablet (25 mcg total) by mouth daily before breakfast. Take on an empty stomach with water only.  Wait to consume other foods/ drinks/ meds for 30 minutes.   levothyroxine 100 MCG tablet Commonly known as: Synthroid Take 1 tablet (100 mcg total) by  mouth daily.   losartan 25 MG tablet Commonly known as: COZAAR Take 1 tablet (25 mg total) by mouth daily.   metoprolol succinate 25 MG 24 hr tablet Commonly known as: TOPROL-XL Take 1 tablet (25 mg total) by mouth daily.   multivitamin capsule Take 1 capsule by mouth daily.   polyvinyl alcohol 1.4 % ophthalmic solution Commonly known as: LIQUIFILM  TEARS Place 1 drop into both eyes as needed for dry eyes.        Follow-up Information     Delynn Flavin M, DO. Schedule an appointment as soon as possible for a visit in 1 week(s).   Specialty: Family Medicine Contact information: [address] [place] [address] [phone]                No Known Allergies  Consultations: Cardiology   Procedures/Studies: ECHOCARDIOGRAM LIMITED Result Date: 05/14/2023    ECHOCARDIOGRAM LIMITED REPORT   Patient Name:   Billy Rasmussen Date of Exam: 05/14/2023 Medical Rec #:  [id number]        Height:       66.0 in Accession #:    [id number]       Weight:       217.6 lb Date of Birth:  1945/06/20         BSA:          2.073 m Patient Age:    77 years         BP:           124/60 mmHg Patient Gender: M                HR:           60 bpm. [facility] Procedure: Limited Echo, Cardiac Doppler, Limited Color Doppler and Intracardiac            Opacification Agent Indications:    I30 Pericarditis; I31.3 Pericardial effusion (noninflammatory)  History:        Patient has prior history of Echocardiogram examinations, most                 recent 01/25/2023. CHF, CAD; Risk Factors:Sleep Apnea and                 Hypertension.  Sonographer:    Dominica Severin RCS, RVS Referring Phys: [phone] Heloise Beecham [facility]  Sonographer Comments: Technically difficult study due to poor echo windows. IMPRESSIONS  1. Limited Echo for evaluation of pericarditis. Trivial pericardial effusion.  2. No evidence of LV thrombus. Left ventricular ejection fraction, by estimation, is 25 to 30%. The left ventricle has severely decreased function. The left ventricle demonstrates global hypokinesis. The left ventricular internal cavity size was mildly dilated. Left ventricular diastolic parameters are consistent with Grade I diastolic dysfunction (impaired relaxation). Elevated left ventricular end-diastolic pressure.  3. Right ventricular systolic function is mildly  reduced. The right ventricular size is normal. Tricuspid regurgitation signal is inadequate for assessing PA pressure. FINDINGS  Left Ventricle: No evidence of LV thrombus. Left ventricular ejection fraction, by estimation, is 25 to 30%. The left ventricle has severely decreased function. The left ventricle demonstrates global hypokinesis. Definity contrast agent was given IV to delineate the left ventricular endocardial borders. The left ventricular internal cavity size was mildly dilated. There is no left ventricular hypertrophy. Left ventricular diastolic parameters are consistent with Grade I diastolic dysfunction (impaired relaxation). Elevated left ventricular end-diastolic pressure. Right Ventricle: The right ventricular size is normal. No  increase in right ventricular wall thickness. Right ventricular systolic function is mildly reduced. Tricuspid regurgitation signal is inadequate for assessing PA pressure. Left [facility]: Left atrial size was not assessed. Right [facility]: Right atrial size was not assessed. Pericardium: Trivial pericardial effusion is present. Mitral Valve: The mitral valve is grossly normal. Mild mitral valve regurgitation. MV peak gradient, 4.2 mmHg. The mean mitral valve gradient is 2.0 mmHg. Tricuspid Valve: The tricuspid valve is not well visualized. Aortic Valve: The aortic valve was not well visualized. Pulmonic Valve: The pulmonic valve was not well visualized. Aorta: Aortic root could not be assessed, the ascending aorta was not well visualized and the aortic arch was not well visualized. Venous: The inferior vena cava was not well visualized. IAS/Shunts: No atrial level shunt detected by color flow Doppler. Additional Comments: A device lead is visualized.  LEFT VENTRICLE PLAX 2D LVIDd:         5.90 cm      Diastology LVIDs:         4.80 cm      LV e' medial:    5.44 cm/s LV PW:         1.00 cm      LV E/e' medial:  12.4 LV IVS:        1.00 cm      LV e' lateral:   3.37 cm/s                              LV E/e' lateral: 20.0  LV Volumes (MOD) LV vol d, MOD A2C: 180.0 ml LV vol d, MOD A4C: 154.0 ml LV vol s, MOD A2C: 135.0 ml LV vol s, MOD A4C: 111.0 ml LV SV MOD A2C:     45.0 ml LV SV MOD A4C:     154.0 ml LV SV MOD BP:      51.1 ml RIGHT VENTRICLE RV Basal diam:  3.20 cm RV Mid diam:    2.70 cm RV S prime:     8.92 cm/s TAPSE (M-mode): 1.4 cm LEFT ATRIUM           Index LA Vol (A4C): 31.4 ml 15.15 ml/m  MITRAL VALVE MV Area (PHT): 1.88 cm MV Peak grad:  4.2 mmHg MV Mean grad:  2.0 mmHg MV Vmax:       1.02 m/s MV Vmean:      67.8 cm/s MV Decel Time: 404 msec MV E velocity: 67.30 cm/s MV A velocity: 89.20 cm/s MV E/A ratio:  0.75 Vishnu Priya Mallipeddi Electronically signed by Winfield Rast Mallipeddi Signature Date/Time: 05/14/2023/6:38:48 PM    Final    CT Angio Chest PE W/Cm &/Or Wo Cm Result Date: 05/13/2023 CLINICAL DATA:  Pulmonary embolism (PE) suspected, high prob. Shortness of breath. EXAM: CT ANGIOGRAPHY CHEST WITH CONTRAST TECHNIQUE: Multidetector CT imaging of the chest was performed using the standard protocol during bolus administration of intravenous contrast. Multiplanar CT image reconstructions and MIPs were obtained to evaluate the vascular anatomy. RADIATION DOSE REDUCTION: This exam was performed according to the departmental dose-optimization program which includes automated exposure control, adjustment of the mA and/or kV according to patient size and/or use of iterative reconstruction technique. CONTRAST:  75mL OMNIPAQUE IOHEXOL 350 MG/ML SOLN COMPARISON:  CT scan chest from 03/22/2023. FINDINGS: Cardiovascular: No evidence of embolism to the proximal subsegmental pulmonary artery level. Mild cardiomegaly. Redemonstration of aneurysm of ascending aorta measuring up to 4.5 cm in diameter; however, please note  the measurements are susceptible to cardiac pulsation artifact on this non cardiac gated exam. Note is also made of mild circumferential wall thickening  predominantly of the ascending aorta and proximal aortic arch. There are also additional areas of focal wall thickening of the descending thoracic aorta (marked with electronic arrow sign on series 4). There is also mild periaortic fat stranding, which is new since the prior study. There is also new mild thickening of the pericardium and trace pericardial fluid, when compared to the prior CT scan. Trace amount of periaortic/pericardial recess free fluid is grossly unchanged. In the appropriate clinical setting, findings are concerning for aortitis/pericarditis. There are coronary artery calcifications, in keeping with coronary artery disease. There is left-sided 3 lead cardiac pacemaker. Mediastinum/Nodes: Markedly limited evaluation of the thyroid gland due to extensive streak artifacts from hardware. No solid / cystic mediastinal masses. The esophagus is nondistended precluding optimal assessment. There are few mildly prominent mediastinal and hilar lymph nodes, which do not meet the size criteria for lymphadenopathy and appear grossly similar to the prior study, favoring benign etiology. No axillary lymphadenopathy by size criteria. Lungs/Pleura: The central tracheo-bronchial tree is patent. There is new trace left pleural effusion with associated minimal atelectatic changes in the left lung lower lobe. No mass, consolidation or pneumothorax. No right pleural effusion. No suspicious lung nodules. Upper Abdomen: Moderate volume layering calcified gallstones noted without imaging signs of acute cholecystitis. Remaining visualized upper abdominal viscera within normal limits. Musculoskeletal: The visualized soft tissues of the chest wall are grossly unremarkable. No suspicious osseous lesions. There are mild multilevel degenerative changes in the visualized spine. Bilateral shoulder arthroplasty noted. Review of the MIP images confirms the above findings. IMPRESSION: *No evidence of embolism to the proximal  subsegmental pulmonary artery level. *There is mild circumferential wall thickening predominantly of the ascending aorta and proximal aortic arch with patchy areas of thickening in the descending thoracic aorta, as described above, concerning for aortitis. *There is also new mild pericardial thickening/trace pericardial fluid, concerning for pericarditis. *There is new left trace pleural effusion. *Multiple other nonacute observations, as described above. Aortic aneurysm NOS (ICD10-I71.9). Electronically Signed   By: Jules Schick M.D.   On: 05/13/2023 11:40   DG Chest Port 1 View Result Date: 05/13/2023 CLINICAL DATA:  Cough, shortness of breath EXAM: PORTABLE CHEST 1 VIEW COMPARISON:  03/16/2023 FINDINGS: Left-sided implanted cardiac device remains in place. Stable mild cardiomegaly. No focal airspace consolidation, pleural effusion, or pneumothorax. Bilateral shoulder prostheses. IMPRESSION: No active disease. Electronically Signed   By: Duanne Guess D.O.   On: 05/13/2023 09:30   MR CARDIAC MORPHOLOGY W WO CONTRAST Result Date: 05/08/2023 CLINICAL DATA:  22M with HFrEF (<20%) s/p BiV-ICD. LHC with nonobstructive CAD EXAM: CARDIAC MRI TECHNIQUE: The patient was scanned on a 1.5 Tesla Siemens magnet. A dedicated cardiac coil was used. Functional imaging was done using Fiesta sequences. 2,3, and 4 chamber views were done to assess for RWMA's. Modified Simpson's rule using a short axis stack was used to calculate an ejection fraction on a dedicated work [employer]. The patient received 10 cc of Gadavist. After 10 minutes inversion recovery sequences were used to assess for infiltration and scar tissue. Phase contrast velocity mapping was performed CONTRAST:  10 cc  of Gadavist FINDINGS: Left ventricle: Unable to quantify volumes/EF due to artifact from patient's device. Visually appeared severe systolic dysfunction. No LGE Right ventricle: Unable to quantify volumes/EF due to artifact  from patient's device. Visually appeared mild systolic  dysfunction Left [facility]: Mild enlargement Right [facility]: Normal size Valves: Unable to evaluate due to artifact Aorta: Dilated ascending aorta measuring 44mm Pericardium: Small effusion IMPRESSION: 1.  No late gadolinium enhancement to suggest myocardial scar 2. Unable to quantify LV/RV volumes and EF due to artifact from patient's device. Visually appeared severe LV systolic dysfunction and mild RV systolic dysfunction 3.  Dilated ascending aorta measuring 44mm Electronically Signed   By: Epifanio Lesches M.D.   On: 05/08/2023 22:14     Discharge Exam: Vitals:   05/14/23 2128 05/15/23 0615  BP: 124/72 129/87  Pulse: 60 (!) 105  Resp: 20 20  Temp: 98.5 F (36.9 C) 98.2 F (36.8 C)  SpO2: 97% 98%   Vitals:   05/14/23 0911 05/14/23 1428 05/14/23 2128 05/15/23 0615  BP: 123/75 136/71 124/72 129/87  Pulse: 65 (!) 59 60 (!) 105  Resp:   20 20  Temp:  98.4 F (36.9 C) 98.5 F (36.9 C) 98.2 F (36.8 C)  TempSrc:  Oral Oral Oral  SpO2:  98% 97% 98%  Weight:      Height:        General: Pt is alert, awake, not in acute distress Cardiovascular: RRR, S1/S2 +, no rubs, no gallops Respiratory: CTA bilaterally, no wheezing, no rhonchi Abdominal: Soft, NT, ND, bowel sounds + Extremities: no edema, no cyanosis    The results of significant diagnostics from this hospitalization (including imaging, microbiology, ancillary and laboratory) are listed below for reference.     Microbiology: Recent Results (from the past 240 hours)  Blood culture (routine x 2)     Status: None (Preliminary result)   Collection Time: 05/13/23  5:05 PM   Specimen: Right Antecubital; Blood  Result Value Ref Range Status   Specimen Description RIGHT ANTECUBITAL  Final   Special Requests   Final    BOTTLES DRAWN AEROBIC AND ANAEROBIC Blood Culture adequate volume   Culture   Final    NO GROWTH 2 DAYS Performed at [facility], [address]  [place], [place] [address]    Report Status PENDING  Incomplete  Blood culture (routine x 2)     Status: None (Preliminary result)   Collection Time: 05/13/23  5:09 PM   Specimen: Left Antecubital; Blood  Result Value Ref Range Status   Specimen Description LEFT ANTECUBITAL  Final   Special Requests   Final    BOTTLES DRAWN AEROBIC ONLY Blood Culture results may not be optimal due to an inadequate volume of blood received in culture bottles   Culture   Final    NO GROWTH 2 DAYS Performed at [facility], [address], [place], [place] [address]    Report Status PENDING  Incomplete     Labs: BNP (last 3 results) Recent Labs    09/27/22 0302 10/11/22 1020 05/13/23 0850  BNP 245.0* 183.9* 88.0   Basic Metabolic Panel: Recent Labs  Lab 05/13/23 0850 05/13/23 1129 05/15/23 0441  NA 135  --  138  K 3.8  --  4.1  CL 101  --  101  CO2 24  --  26  GLUCOSE 88  --  87  BUN 22  --  26*  CREATININE 1.32*  --  1.49*  CALCIUM 9.3  --  9.4  MG  --  2.1 2.3   Liver Function Tests: No results for input(s): "AST", "ALT", "ALKPHOS", "BILITOT", "PROT", "ALBUMIN" in the last 168 hours. No results for input(s): "LIPASE", "AMYLASE" in the last 168  hours. No results for input(s): "AMMONIA" in the last 168 hours. CBC: Recent Labs  Lab 05/13/23 0850 05/15/23 0441  WBC 8.1 5.9  NEUTROABS 6.1  --   HGB 14.3 14.3  HCT 42.2 42.8  MCV 91.7 92.2  PLT 179 174   Cardiac Enzymes: No results for input(s): "CKTOTAL", "CKMB", "CKMBINDEX", "TROPONINI" in the last 168 hours. BNP: Invalid input(s): "POCBNP" CBG: No results for input(s): "GLUCAP" in the last 168 hours. D-Dimer Recent Labs    05/13/23 0850  DDIMER 1.28*   Hgb A1c No results for input(s): "HGBA1C" in the last 72 hours. Lipid Profile No results for input(s): "CHOL", "HDL", "LDLCALC", "TRIG", "CHOLHDL", "LDLDIRECT" in the last 72 hours. Thyroid function studies No results for input(s): "TSH", "T4TOTAL", "T3FREE", "THYROIDAB"  in the last 72 hours.  Invalid input(s): "FREET3" Anemia work up No results for input(s): "VITAMINB12", "FOLATE", "FERRITIN", "TIBC", "IRON", "RETICCTPCT" in the last 72 hours. Urinalysis    Component Value Date/Time   COLORURINE YELLOW 10/06/2022 1956   APPEARANCEUR Clear 11/26/2022 0833   LABSPEC 1.009 10/06/2022 1956   PHURINE 6.0 10/06/2022 1956   GLUCOSEU Negative 11/26/2022 0833   HGBUR NEGATIVE 10/06/2022 1956   BILIRUBINUR Negative 11/26/2022 0833   KETONESUR NEGATIVE 10/06/2022 1956   PROTEINUR Trace (A) 11/26/2022 0833   PROTEINUR NEGATIVE 10/06/2022 1956   NITRITE Negative 11/26/2022 0833   NITRITE POSITIVE (A) 10/06/2022 1956   LEUKOCYTESUR 1+ (A) 11/26/2022 0833   LEUKOCYTESUR LARGE (A) 10/06/2022 1956   Sepsis Labs Recent Labs  Lab 05/13/23 0850 05/15/23 0441  WBC 8.1 5.9   Microbiology Recent Results (from the past 240 hours)  Blood culture (routine x 2)     Status: None (Preliminary result)   Collection Time: 05/13/23  5:05 PM   Specimen: Right Antecubital; Blood  Result Value Ref Range Status   Specimen Description RIGHT ANTECUBITAL  Final   Special Requests   Final    BOTTLES DRAWN AEROBIC AND ANAEROBIC Blood Culture adequate volume   Culture   Final    NO GROWTH 2 DAYS Performed at [facility], [address], [place], [place] [address]    Report Status PENDING  Incomplete  Blood culture (routine x 2)     Status: None (Preliminary result)   Collection Time: 05/13/23  5:09 PM   Specimen: Left Antecubital; Blood  Result Value Ref Range Status   Specimen Description LEFT ANTECUBITAL  Final   Special Requests   Final    BOTTLES DRAWN AEROBIC ONLY Blood Culture results may not be optimal due to an inadequate volume of blood received in culture bottles   Culture   Final    NO GROWTH 2 DAYS Performed at [facility], [address], [place], [place] [address]    Report Status PENDING  Incomplete     Time coordinating discharge: 35  minutes  SIGNED:   Erick Blinks, DO [facility] 05/15/2023, 9:26 AM  If 7PM-7AM, please contact night-coverage [email or web address]

## 2023-05-17 ENCOUNTER — Encounter: Payer: Self-pay | Admitting: Nurse Practitioner

## 2023-05-17 ENCOUNTER — Telehealth: Payer: Self-pay | Admitting: Family Medicine

## 2023-05-17 ENCOUNTER — Ambulatory Visit: Payer: Medicare HMO | Admitting: Nurse Practitioner

## 2023-05-17 VITALS — BP 113/69 | HR 66 | Temp 98.5°F | Ht 66.0 in | Wt 218.0 lb

## 2023-05-17 DIAGNOSIS — Z09 Encounter for follow-up examination after completed treatment for conditions other than malignant neoplasm: Secondary | ICD-10-CM | POA: Diagnosis not present

## 2023-05-17 DIAGNOSIS — I319 Disease of pericardium, unspecified: Secondary | ICD-10-CM | POA: Insufficient documentation

## 2023-05-17 DIAGNOSIS — I309 Acute pericarditis, unspecified: Secondary | ICD-10-CM | POA: Diagnosis not present

## 2023-05-17 HISTORY — DX: Disease of pericardium, unspecified: I31.9

## 2023-05-17 NOTE — Progress Notes (Signed)
Established Patient Office Visit  Subjective  Patient ID: Billy Rasmussen, male    DOB: 1946/03/14  Age: 77 y.o. 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  Past medical history is significant for systolic and diastolic CHF, bladder cancer, hypertension, OSA, renal mass, AICD.  Presented on May 17, 2019 for t hospital discharge follow-up. " Last night had choir practice and woke this morning with pain in the vocal pain".  Client was a concern for high dose of aspirin and stopped taking for the pericarditis.  Since he has history of GERD and supposed to start radiation soon for thyroid cancer. had thyroidectomy  04/14/2023 ans suppose to start radiation January 6th and is concerns about being on high dose od aspirin.  Since he has an appointment with cardiology in January advised him to discuss that with them and to continue taking the high dose of aspirin that is required for his pericarditis treatment  Patient Active Problem List   Diagnosis Date Noted   Hospital discharge follow-up 05/17/2023   Pericarditis 05/17/2023   Chest pain 05/13/2023   S/P total thyroidectomy 04/14/2023   HFrEF (heart failure with reduced ejection fraction) (HCC) 10/10/2022   Acute combined systolic and diastolic heart failure (HCC) 10/08/2022   Acute on chronic systolic CHF (congestive heart failure) (HCC) 10/08/2022   Hyponatremia 10/07/2022   Thrombocytopenia (HCC) 10/07/2022   Elevated troponin 10/07/2022   Acquired hypothyroidism 10/07/2022   Hypotension 10/06/2022   Multiple thyroid nodules 09/14/2022   Aneurysm of ascending aorta without rupture (HCC) 09/02/2022   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   Lumbar foraminal stenosis 03/21/2020   S/P radical cystoprostatectomy 12/28/2019   S/p nephrectomy 12/28/2019   Renal mass 07/15/2019    Nonfunctioning kidney 07/14/2019   UTI (urinary tract infection) 06/25/2019   OSA (obstructive sleep apnea) 05/12/2019   Abnormal renal function 01/20/2019   Gastroesophageal reflux disease    AKI (acute kidney injury) (HCC) 12/22/2018   Bladder cancer (HCC) 11/09/2018   BCG cystitis 02/02/2018   Dysuria 11/20/2017   Pure hypercholesterolemia 10/02/2016   Malignant neoplasm of urinary bladder (HCC) 10/02/2016   Lipoma 09/30/2016   Obese 03/31/2016   S/P right TKA 03/30/2016   S/P knee replacement 03/30/2016   Essential hypertension 02/25/2016   Obesity (BMI 30-39.9) 01/07/2016   S/P shoulder replacement 07/20/2013   Shoulder arthritis 04/07/2013   Past Medical History:  Diagnosis Date   AICD (automatic cardioverter/defibrillator) present    [employer]ott/St Jude   Arthritis    knees, shoulders, elbows   Bladder cancer [facility])    urologist-  dr Mena Goes   CHF (congestive heart failure) (HCC)    Diverticulosis of colon    Fibromyalgia    GERD (gastroesophageal reflux disease)    History of diverticulitis of colon    History of gastric ulcer    due to aleve   Hypertension    Lower urinary tract symptoms (LUTS)    OSA (obstructive sleep apnea)    Wears CPAP nightly   PONV (postoperative nausea and vomiting)    Psoriasis    Sepsis (HCC)    january 2021   Tinnitus    right ear more, has tranmitter in right ear removable at hs   Wears glasses    Wears partial dentures    Past Surgical History:  Procedure Laterality  Date   APPENDECTOMY     BIV ICD INSERTION CRT-D N/A 03/01/2023   Procedure: BIV ICD INSERTION CRT-D;  Surgeon: Mealor, Roberts Gaudy, MD;  Location: [facility] INVASIVE CV LAB;  Service: Cardiovascular;  Laterality: N/A;   COLECTOMY W/ COLOSTOMY  1996   W/   APPENDECTOMY   COLONOSCOPY N/A 05/11/2018   Procedure: COLONOSCOPY;  Surgeon: Corbin Ade, MD;  [facility];  Service: Endoscopy;  Laterality: N/A;  9:30   COLOSTOMY TAKEDOWN  1996   CYSTOSCOPY WITH  BIOPSY N/A 12/05/2013   Procedure: CYSTO BLADDER BIOPSY AND FULGERATION;  Surgeon: Jerilee Field, MD;  Location: [facility];  Service: Urology;  Laterality: N/A;   CYSTOSCOPY WITH BIOPSY Bilateral 11/13/2014   Procedure: CYSTOSCOPY WITH  BLADDER BIOPSY FULGERATION AND BILATERAL RETROGRADE PYELOGRAMS;  Surgeon: Jerilee Field, MD;  Location: [facility];  Service: Urology;  Laterality: Bilateral;   CYSTOSCOPY WITH FULGERATION N/A 01/18/2018   Procedure: CYSTOSCOPY WITH FULGERATION/ BLADDER BIOPSY;  Surgeon: Jerilee Field, MD;  Location[facility];  Service: Urology;  Laterality: N/A;   CYSTOSCOPY WITH INJECTION N/A 11/09/2018   Procedure: CYSTOSCOPY WITH INJECTION OF INDOCYANINE GREEN DYE;  Surgeon: Sebastian Ache, MD;  Location: [facility];  Service: Urology;  Laterality: N/A;   CYSTOSCOPY WITH INSERTION OF UROLIFT N/A 01/18/2018   Procedure: CYSTOSCOPY WITH INSERTION OF UROLIFT;  Surgeon: Jerilee Field, MD;  Location: [facility];  Service: Urology;  Laterality: N/A;   CYSTOSCOPY/URETEROSCOPY/HOLMIUM LASER Left 02/17/2019   Procedure: URETEROSCOPY WITH BALLOON DILATION  AND NEPHROSTOGRAM;  Surgeon: Sebastian Ache, MD;  Location[facility];  Service: Urology;  Laterality: Left;  1 HR   EXCISION RIGHT UPPER ARM LIPOMA  2005   HEMORROIDECTOMY     INGUINAL HERNIA REPAIR Left 1984   IR NEPHROSTOMY PLACEMENT LEFT  01/05/2019   KNEE ARTHROSCOPY Left X3  LAST ONE  2002   ORIF LEFT HUMEROUS FX  1976   POLYPECTOMY  05/11/2018   Procedure: POLYPECTOMY;  Surgeon: Corbin Ade, MD;  [facility];  Service: Endoscopy;;   PROSTATE SURGERY     RIGHT HEART CATH AND CORONARY ANGIOGRAPHY N/A 10/09/2022   Procedure: RIGHT HEART CATH AND CORONARY ANGIOGRAPHY;  Surgeon: [name], MD;  Location: [facility] INV[facility] CV LAB;  Service: Cardiovascular;  Laterality: N/A;   ROBOT ASSISTED LAPAROSCOPIC NEPHRECTOMY Left 07/14/2019    Procedure: XI ROBOTIC ASSISTED LAPAROSCOPIC RETROPERITONEAL NEPHRECTOMY;  Surgeon: Sebastian Ache, MD;  Location: [facility];  Service: Urology;  Laterality: Left;  3 HRS   THYROIDECTOMY Bilateral 04/14/2023   Procedure: TOTAL THYROIDECTOMY;  Surgeon: [name], MD;  Location: [facility];  Service: ENT;  Laterality: Bilateral;   TONSILLECTOMY  AS CHILD   TOTAL KNEE ARTHROPLASTY Left 2006   REVISION 2007  (AFTER I & D WITH ANTIBIOTIC SPACER PROCEDURE FOR STEPH INFECTION)   TOTAL KNEE ARTHROPLASTY Right 03/30/2016   Procedure: RIGHT TOTAL KNEE ARTHROPLASTY;  Surgeon: Durene Romans, MD;  Location: [facility];  Service: Orthopedics;  Laterality: Right;   TOTAL SHOULDER ARTHROPLASTY Right 04/06/2013   Procedure: RIGHT TOTAL SHOULDER ARTHROPLASTY;  Surgeon: Senaida Lange, MD;  Location: [facility];  Service: Orthopedics;  Laterality: Right;   TOTAL SHOULDER ARTHROPLASTY Left 07/20/2013   Procedure: LEFT TOTAL SHOULDER ARTHROPLASTY;  Surgeon: Senaida Lange, MD;  Location: [facility];  Service: Orthopedics;  Laterality: Left;   TRANSURETHRAL RESECTION OF BLADDER TUMOR N/A 11/12/2015   Procedure: TRANSURETHRAL RESECTION OF BLADDER TUMOR (  TURBT);  Surgeon: Jerilee Field, MD;  Location: [facility];  Service: Urology;  Laterality: N/A;   TRANSURETHRAL RESECTION OF BLADDER TUMOR WITH GYRUS (TURBT-GYRUS) N/A 12/05/2013   Procedure: TRANSURETHRAL RESECTION OF BLADDER TUMOR WITH GYRUS (TURBT-GYRUS);  Surgeon: Jerilee Field, MD;  Location: [facility];  Service: Urology;  Laterality: N/A;   Social History   Tobacco Use   Smoking status: Former    Current packs/day: 0.00    Average packs/day: 2.0 packs/day for 40.0 years (80.0 ttl pk-yrs)    Types: Cigarettes    Start date: 06/01/1957    Quit date: 06/01/1997    Years since quitting: 25.9   Smokeless tobacco: Never  Vaping Use   Vaping status: Never Used  Substance Use Topics   Alcohol use: No   Drug use: No   Social History    Socioeconomic History   Marital status: Married    Spouse name: Peggy   Number of children: 2   Years of education: 14   Highest education level: Tax adviser degree: occupational, Scientist, product/process development, or vocational program  Occupational History   Occupation: retired    Comment: Pharmacologist, 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   Retired, Visual merchandiser   Social Drivers of Corporate investment banker Strain: Medium Risk (12/11/2022)   Overall Financial Resource Strain (CARDIA)    Difficulty of Paying Living Expenses: Somewhat hard  Food Insecurity: No Food Insecurity (05/13/2023)   Hunger Vital Sign    Worried About Running Out of Food in the Last Year: Never true    Ran Out of Food in the Last Year: Never true  Transportation Needs: No Transportation Needs (05/13/2023)   PRAPARE - Administrator, Civil Service (Medical): No    Lack of Transportation (Non-Medical): No  Physical Activity: Inactive (12/11/2022)   Exercise Vital Sign    Days of Exercise per Week: 0 days    Minutes of Exercise per Session: 0 min  Stress: No Stress Concern Present (12/11/2022)   [employer] of Occupational Health - Occupational Stress Questionnaire    Feeling of Stress : Not at all  Social Connections: Socially Integrated (12/11/2022)   Social Connection and Isolation Panel [NHANES]    Frequency of Communication with Friends and Family: Three times a  week    Frequency of Social Gatherings with Friends and Family: Once a week    Attends Religious Services: 1 to 4 times per year    Active Member of [employer] or Organizations: Yes    Attends Banker Meetings: 1 to 4 times per year    Marital Status: Married  Catering manager Violence: Not At Risk (05/13/2023)   Humiliation, Afraid, Rape, and Kick questionnaire    Fear of Current or Ex-Partner: No  Emotionally Abused: No    Physically Abused: No    Sexually Abused: No   Family Status  Relation Name Status   Mother  Deceased   Father  Deceased   Sister  Deceased at age as a baby    Brother Financial planner   MGM  Deceased   MGF  Deceased   PGM  Deceased   PGF  Deceased   Daughter SHONDA Alive   Daughter EMILY Alive   Neg Hx  (Not Specified)  No partnership data on file   Family History  Problem Relation Age of Onset   Alzheimer's disease Mother    Heart attack Father    Heart disease Father    Irregular heart beat Brother        DEFIB.   Congestive Heart Failure Brother    Diabetes Daughter    Sleep apnea Neg Hx    No Known Allergies    ROS Negative unless indicated in HPI   Objective:     BP 113/69   Pulse 66   Temp 98.5 F (36.9 C)   Ht 5\' 6"  (1.676 m)   Wt 218 lb (98.9 kg)   SpO2 97%   BMI 35.19 kg/m  BP Readings from Last 3 Encounters:  05/17/23 113/69  05/15/23 129/87  04/15/23 (!) 141/65   Wt Readings from Last 3 Encounters:  05/17/23 218 lb (98.9 kg)  05/13/23 217 lb 9.5 oz (98.7 kg)  04/14/23 216 lb (98 kg)      Physical Exam Vitals and nursing note reviewed.  Constitutional:      Appearance: Normal appearance.  HENT:     Head: Normocephalic and atraumatic.     Nose: Nose normal.     Mouth/Throat:     Mouth: Mucous membranes are moist.  Eyes:     General: No scleral icterus.    Extraocular Movements: Extraocular movements intact.     Conjunctiva/sclera: Conjunctivae normal.     Pupils: Pupils are equal, round, and reactive  to light.  Cardiovascular:     Rate and Rhythm: Normal rate and regular rhythm.  Pulmonary:     Effort: Pulmonary effort is normal.     Breath sounds: Normal breath sounds.  Musculoskeletal:        General: Normal range of motion.     Cervical back: Normal range of motion and neck supple.     Left lower leg: No edema.  Skin:    General: Skin is warm and dry.     Findings: No rash.  Neurological:     Mental Status: He is alert and oriented to person, place, and time. Mental status is at baseline.  Psychiatric:        Mood and Affect: Mood normal.        Thought Content: Thought content normal.        Judgment: Judgment normal.    No results found for any visits on 05/17/23.  Last CBC Lab Results  Component Value Date   WBC 5.9 05/15/2023   HGB 14.3 05/15/2023   HCT 42.8 05/15/2023   MCV 92.2 05/15/2023   MCH 30.8 05/15/2023   RDW 14.0 05/15/2023   PLT 174 05/15/2023   Last metabolic panel Lab Results  Component Value Date   GLUCOSE 87 05/15/2023   NA 138 05/15/2023   K 4.1 05/15/2023   CL 101 05/15/2023   CO2 26 05/15/2023   BUN 26 (H) 05/15/2023   CREATININE 1.49 (H) 05/15/2023   GFRNONAA  49 (L) 05/15/2023   CALCIUM 9.4 05/15/2023   PHOS 2.8 10/20/2022   PROT 7.2 03/18/2023   ALBUMIN 4.5 03/18/2023   LABGLOB 2.7 03/18/2023   AGRATIO 1.7 11/05/2022   BILITOT 0.7 03/18/2023   ALKPHOS 84 03/18/2023   AST 53 (H) 03/18/2023   ALT 36 03/18/2023   ANIONGAP 11 05/15/2023   Last lipids Lab Results  Component Value Date   CHOL 93 (L) 03/18/2023   HDL 21 (L) 03/18/2023   LDLCALC 33 03/18/2023   LDLDIRECT 96 02/25/2016   TRIG 253 (H) 03/18/2023   CHOLHDL 4.4 03/18/2023   Last hemoglobin A1c Lab Results  Component Value Date   HGBA1C 5.1 07/07/2022   Last thyroid functions Lab Results  Component Value Date   TSH 2.090 09/16/2022      Assessment & Plan:  Hospital discharge follow-up  Acute pericarditis associated with other disease   Virtus is a  77 year old Caucasian male seen today posthospital discharge for pericarditis, no acute distress.   Client to continue Continue on aspirin with tapering dose as prescribed  -Contact oncology team to determine if radiation needs to be rescheduled since he is taking aspirin Follow-up with cardiology as scheduled in January Continue on colchicine for 3 months as prescribed Exercise restriction for 3 months  Return for follow-up with PCP  as scheduled.    Arrie Aran Santa Lighter, [facility] [address] [place], [place] [address] [phone]    Note: This document was prepared by Reubin Milan voice dictation technology and any errors that results from this process are unintentional.

## 2023-05-17 NOTE — Telephone Encounter (Signed)
Attempted to call pt no answer   I do not see orders in chart - went ahead and made lab apt if pt calls please confirm

## 2023-05-17 NOTE — Telephone Encounter (Signed)
Copied from CRM [id number]. Topic: Clinical - Request for Lab/Test Order >> May 17, 2023  8:31 AM Dennison Nancy wrote: Reason for CRM: patient requesting if can do blood work for 05/20/23 and patient stated he already have the order

## 2023-05-18 ENCOUNTER — Encounter: Payer: Self-pay | Admitting: Internal Medicine

## 2023-05-18 ENCOUNTER — Encounter: Payer: Self-pay | Admitting: Cardiology

## 2023-05-18 LAB — CULTURE, BLOOD (ROUTINE X 2)
Culture: NO GROWTH
Culture: NO GROWTH
Special Requests: ADEQUATE

## 2023-05-18 NOTE — Telephone Encounter (Signed)
Spoke to patient he stated he was discharged from hospital this past Sat 12/14.Stated he went to church on Sunday 12/15.Stated he noticed his sob and chest pain seems worse since he sang in church.Stated he has had 2 days of taking Aspirin taper dose as prescribed and Colchicine.He was concerned about taking so much aspirin due to having 1 kidney.Advised he should take Aspirin as prescribed and Colchicine. Advised no singing until inflammation has gone.Advised if he continues to have sob and chest pain he needs to go to ED.He wanted to let Dr.Schumann know he starts radiation treatment for thyroid 06/07/23.He wanted to make sure ok to start treatment.Advised I will send message to Dr.Schumann.

## 2023-05-19 ENCOUNTER — Other Ambulatory Visit: Payer: Self-pay

## 2023-05-19 ENCOUNTER — Emergency Department (HOSPITAL_COMMUNITY): Payer: Medicare HMO

## 2023-05-19 ENCOUNTER — Emergency Department (HOSPITAL_COMMUNITY)
Admission: EM | Admit: 2023-05-19 | Discharge: 2023-05-19 | Disposition: A | Discharge status: Home / Self Care | Payer: Medicare HMO | Attending: Emergency Medicine | Admitting: Emergency Medicine

## 2023-05-19 DIAGNOSIS — Z85850 Personal history of malignant neoplasm of thyroid: Secondary | ICD-10-CM | POA: Insufficient documentation

## 2023-05-19 DIAGNOSIS — Z95810 Presence of automatic (implantable) cardiac defibrillator: Secondary | ICD-10-CM | POA: Diagnosis not present

## 2023-05-19 DIAGNOSIS — I776 Arteritis, unspecified: Secondary | ICD-10-CM

## 2023-05-19 DIAGNOSIS — Z7982 Long term (current) use of aspirin: Secondary | ICD-10-CM | POA: Insufficient documentation

## 2023-05-19 DIAGNOSIS — Z5986 Financial insecurity: Secondary | ICD-10-CM | POA: Diagnosis not present

## 2023-05-19 DIAGNOSIS — K4020 Bilateral inguinal hernia, without obstruction or gangrene, not specified as recurrent: Secondary | ICD-10-CM | POA: Diagnosis not present

## 2023-05-19 DIAGNOSIS — I309 Acute pericarditis, unspecified: Secondary | ICD-10-CM

## 2023-05-19 DIAGNOSIS — I2510 Atherosclerotic heart disease of native coronary artery without angina pectoris: Secondary | ICD-10-CM | POA: Insufficient documentation

## 2023-05-19 DIAGNOSIS — Z8551 Personal history of malignant neoplasm of bladder: Secondary | ICD-10-CM | POA: Insufficient documentation

## 2023-05-19 DIAGNOSIS — I4891 Unspecified atrial fibrillation: Secondary | ICD-10-CM | POA: Diagnosis not present

## 2023-05-19 DIAGNOSIS — I300 Acute nonspecific idiopathic pericarditis: Secondary | ICD-10-CM | POA: Diagnosis not present

## 2023-05-19 DIAGNOSIS — N1831 Chronic kidney disease, stage 3a: Secondary | ICD-10-CM | POA: Diagnosis not present

## 2023-05-19 DIAGNOSIS — Z96612 Presence of left artificial shoulder joint: Secondary | ICD-10-CM | POA: Diagnosis not present

## 2023-05-19 DIAGNOSIS — Z8249 Family history of ischemic heart disease and other diseases of the circulatory system: Secondary | ICD-10-CM | POA: Diagnosis not present

## 2023-05-19 DIAGNOSIS — R0602 Shortness of breath: Secondary | ICD-10-CM | POA: Diagnosis not present

## 2023-05-19 DIAGNOSIS — I130 Hypertensive heart and chronic kidney disease with heart failure and stage 1 through stage 4 chronic kidney disease, or unspecified chronic kidney disease: Secondary | ICD-10-CM | POA: Insufficient documentation

## 2023-05-19 DIAGNOSIS — I447 Left bundle-branch block, unspecified: Secondary | ICD-10-CM | POA: Insufficient documentation

## 2023-05-19 DIAGNOSIS — R079 Chest pain, unspecified: Secondary | ICD-10-CM

## 2023-05-19 DIAGNOSIS — I5022 Chronic systolic (congestive) heart failure: Secondary | ICD-10-CM | POA: Diagnosis not present

## 2023-05-19 DIAGNOSIS — I3139 Other pericardial effusion (noninflammatory): Secondary | ICD-10-CM

## 2023-05-19 DIAGNOSIS — I428 Other cardiomyopathies: Secondary | ICD-10-CM | POA: Diagnosis not present

## 2023-05-19 DIAGNOSIS — R7982 Elevated C-reactive protein (CRP): Secondary | ICD-10-CM | POA: Diagnosis not present

## 2023-05-19 DIAGNOSIS — I5020 Unspecified systolic (congestive) heart failure: Secondary | ICD-10-CM | POA: Diagnosis not present

## 2023-05-19 DIAGNOSIS — I517 Cardiomegaly: Secondary | ICD-10-CM | POA: Diagnosis not present

## 2023-05-19 DIAGNOSIS — K5730 Diverticulosis of large intestine without perforation or abscess without bleeding: Secondary | ICD-10-CM | POA: Diagnosis not present

## 2023-05-19 DIAGNOSIS — I308 Other forms of acute pericarditis: Secondary | ICD-10-CM | POA: Diagnosis not present

## 2023-05-19 DIAGNOSIS — R0789 Other chest pain: Secondary | ICD-10-CM | POA: Diagnosis not present

## 2023-05-19 DIAGNOSIS — N189 Chronic kidney disease, unspecified: Secondary | ICD-10-CM | POA: Diagnosis not present

## 2023-05-19 DIAGNOSIS — E890 Postprocedural hypothyroidism: Secondary | ICD-10-CM | POA: Diagnosis not present

## 2023-05-19 DIAGNOSIS — I509 Heart failure, unspecified: Secondary | ICD-10-CM | POA: Insufficient documentation

## 2023-05-19 DIAGNOSIS — R700 Elevated erythrocyte sedimentation rate: Secondary | ICD-10-CM | POA: Diagnosis not present

## 2023-05-19 DIAGNOSIS — I319 Disease of pericardium, unspecified: Secondary | ICD-10-CM | POA: Diagnosis not present

## 2023-05-19 DIAGNOSIS — J432 Centrilobular emphysema: Secondary | ICD-10-CM | POA: Diagnosis not present

## 2023-05-19 LAB — COMPREHENSIVE METABOLIC PANEL
ALT: 16 U/L (ref 0–44)
AST: 33 U/L (ref 15–41)
Albumin: 3.9 g/dL (ref 3.5–5.0)
Alkaline Phosphatase: 76 U/L (ref 38–126)
Anion gap: 15 (ref 5–15)
BUN: 23 mg/dL (ref 8–23)
CO2: 20 mmol/L — ABNORMAL LOW (ref 22–32)
Calcium: 8.9 mg/dL (ref 8.9–10.3)
Chloride: 101 mmol/L (ref 98–111)
Creatinine, Ser: 1.55 mg/dL — ABNORMAL HIGH (ref 0.61–1.24)
GFR, Estimated: 46 mL/min — ABNORMAL LOW (ref >60)
Glucose, Bld: 92 mg/dL (ref 70–99)
Potassium: 4.2 mmol/L (ref 3.5–5.1)
Sodium: 136 mmol/L (ref 135–145)
Total Bilirubin: 0.9 mg/dL (ref <1.2)
Total Protein: 7.7 g/dL (ref 6.5–8.1)

## 2023-05-19 LAB — CBC WITH DIFFERENTIAL/PLATELET
Abs Immature Granulocytes: 0.05 10*3/uL (ref 0.00–0.07)
Basophils Absolute: 0 10*3/uL (ref 0.0–0.1)
Basophils Relative: 0 %
Eosinophils Absolute: 0.1 10*3/uL (ref 0.0–0.5)
Eosinophils Relative: 1 %
HCT: 40.5 % (ref 39.0–52.0)
Hemoglobin: 13.4 g/dL (ref 13.0–17.0)
Immature Granulocytes: 1 %
Lymphocytes Relative: 9 %
Lymphs Abs: 0.9 10*3/uL (ref 0.7–4.0)
MCH: 30.8 pg (ref 26.0–34.0)
MCHC: 33.1 g/dL (ref 30.0–36.0)
MCV: 93.1 fL (ref 80.0–100.0)
Monocytes Absolute: 1 10*3/uL (ref 0.1–1.0)
Monocytes Relative: 10 %
Neutro Abs: 7.6 10*3/uL (ref 1.7–7.7)
Neutrophils Relative %: 79 %
Platelets: 186 10*3/uL (ref 150–400)
RBC: 4.35 MIL/uL (ref 4.22–5.81)
RDW: 14.3 % (ref 11.5–15.5)
WBC: 9.6 10*3/uL (ref 4.0–10.5)
nRBC: 0 % (ref 0.0–0.2)

## 2023-05-19 LAB — SEDIMENTATION RATE: Sed Rate: 57 mm/h — ABNORMAL HIGH (ref 0–16)

## 2023-05-19 LAB — MAGNESIUM: Magnesium: 1.9 mg/dL (ref 1.7–2.4)

## 2023-05-19 LAB — I-STAT CREATININE, ED: Creatinine, Ser: 1.6 mg/dL — ABNORMAL HIGH (ref 0.61–1.24)

## 2023-05-19 LAB — C-REACTIVE PROTEIN: CRP: 17.7 mg/dL — ABNORMAL HIGH (ref <1.0)

## 2023-05-19 LAB — TROPONIN I (HIGH SENSITIVITY)
Troponin I (High Sensitivity): 17 ng/L (ref <18)
Troponin I (High Sensitivity): 18 ng/L — ABNORMAL HIGH (ref <18)

## 2023-05-19 LAB — ECHOCARDIOGRAM COMPLETE
Calc EF: 22.9 %
S' Lateral: 4.8 cm
Single Plane A2C EF: 17.6 %
Single Plane A4C EF: 27.3 %

## 2023-05-19 LAB — TSH: TSH: 3.969 u[IU]/mL (ref 0.350–4.500)

## 2023-05-19 LAB — BRAIN NATRIURETIC PEPTIDE: B Natriuretic Peptide: 64.6 pg/mL (ref 0.0–100.0)

## 2023-05-19 MED ORDER — HYDROMORPHONE HCL 1 MG/ML IJ SOLN
1.0000 mg | Freq: Once | INTRAMUSCULAR | Status: AC
Start: 2023-05-19 — End: 2023-05-19
  Administered 2023-05-19: 1 mg via INTRAVENOUS
  Filled 2023-05-19: qty 1

## 2023-05-19 MED ORDER — PANTOPRAZOLE SODIUM 40 MG PO TBEC
40.0000 mg | DELAYED_RELEASE_TABLET | Freq: Every day | ORAL | Status: DC
  Administered 2023-05-19: 40 mg via ORAL
  Filled 2023-05-19: qty 1

## 2023-05-19 MED ORDER — HYDROMORPHONE HCL 1 MG/ML IJ SOLN
1.0000 mg | INTRAMUSCULAR | Status: DC | PRN
  Administered 2023-05-19: 1 mg via INTRAVENOUS
  Filled 2023-05-19: qty 1

## 2023-05-19 MED ORDER — IOHEXOL 350 MG/ML SOLN
85.0000 mL | Freq: Once | INTRAVENOUS | Status: AC | PRN
  Administered 2023-05-19: 85 mL via INTRAVENOUS

## 2023-05-19 MED ORDER — ASPIRIN 325 MG PO TABS
975.0000 mg | ORAL_TABLET | Freq: Three times a day (TID) | ORAL | Status: DC
  Administered 2023-05-19: 975 mg via ORAL
  Filled 2023-05-19: qty 3

## 2023-05-19 NOTE — ED Triage Notes (Addendum)
Pt. Stated, I've had chest pain with SOB and neck pain. I just got out of the hospital with the same symptoms on Dec. 14 for 2 days. Pt has a defibrillator

## 2023-05-19 NOTE — ED Notes (Signed)
Pt asking for glasses. Pt's wife was called and asked if she took them before she left facility. Pt's wife stated, "You know I think they're in my truck, when it turns daylight tomorrow I will flip that truck upside down if I have to because I believe they are in there." Pt notified of wife's statements regarding location of glasses. Pt being transported by [facility] at this time.

## 2023-05-19 NOTE — ED Provider Notes (Addendum)
Signout from Clio PA-C at shift change. Briefly, patient presents for continuing chest pain and shortness of breath after recent admission for pericarditis/aortitis.    Plan: Currently awaiting results of CT imaging, cardiology consult.  Follow cards recommendations.   3:37 PM Dr. Duke Salvia came and spoke with me directly.  She is concerned about the patient's condition and lack of improvement thus far on aspirin and colchicine.  She feels that patient would benefit from being at a facility where rheumatology is available for consultation.  She recommends transfer to a tertiary care center.  Labs and imaging personally reviewed and interpreted including: CBC unremarkable; CMP with creatinine 1.55, minimally elevated from 4 days ago when it was 1.49 and 6 days ago when it was 1.32; troponin minimally elevated at 18; magnesium 1.9 and BNP 65.  CT dissection study shows a small pericardial effusion, mildly progressive from previous imaging, small 1 cm hypervascular lesion in the pancreas, possibly reflecting a small pancreatic neuroendocrine tumor.   Patient is more comfortable at bedside, asking for something to eat.  States that his pain has been less controlled after first dose of medication.   Most current vital signs reviewed and are as follows: BP 130/75   Pulse (!) 46   Temp 98.6 F (37 C)   Resp (!) 22   SpO2 97%   Plan: Will discuss case with cardiology at [facility].  Awaiting callback.  5:20 PM I discussed case with cardiology at [facility], Dr. Nance Pear.  They accept patient to cardiology service, Dr. Faylene Million will be accepting.   5:54 PM of note, inflammatory markers noted to be increasing from previous admission.      Renne Crigler, PA-C 05/19/23 1754    Renne Crigler, PA-C 05/19/23 1755    Tegeler, Canary Brim, MD 05/19/23 2241

## 2023-05-19 NOTE — Progress Notes (Signed)
Echocardiogram 2D Echocardiogram has been performed.  Warren Lacy Bri Wakeman RDCS 05/19/2023, 1:01 PM

## 2023-05-19 NOTE — ED Notes (Signed)
Pt. Not in room for troponin draw.

## 2023-05-19 NOTE — Telephone Encounter (Signed)
Agree with plan, OK to start radiation for thyroid

## 2023-05-19 NOTE — Consult Note (Addendum)
Cardiology Consultation   Patient ID: Billy Rasmussen MRN: [id number]; DOB: [birth year]  Admit date: 05/19/2023 Date of Consult: 05/19/2023  PCP:  Raliegh Ip, DO   [facility] Providers Cardiologist:  Little Ishikawa, MD  Electrophysiologist:  Maurice Small, MD  {  Patient Profile:   Billy Rasmussen is a 77 y.o. male with a hx of chronic combined nonischemic cardiomyopathy with CRT-D, aorticitis, thyroid cancer about to undergo radiation, bladder cancer status post left nephrectomy, cystoscopy prostatectomy, hypertension, OSA not on CPAP, ascending order aneurysm (4.5 cm) who is being seen 05/19/2023 for the evaluation of pericarditis/constrictive pericardial effusion at the request of Dr. Renaye Rakers.  History of Present Illness:   Billy Rasmussen has newly diagnosed nonischemic cardiomyopathy diagnosed in May 2024 with EF noted to be less than 20% with left bundle branch block status post CRT-D in September 2024.  He had right and left heart catheterization that did not show significant CAD. He had (30% RCA stenosis, RA 6 RV 50/3, PA 55/26/37 PCWP 27 CI 3.85.)   Unable to tolerate MRI due to metal in his eye.  Additionally had ZIO in June 2024 that showed only 1 episode of NSVT, 10 episodes of SVT.  He was recently seen earlier this month for pericarditis, and placed on aspirin to 1000 mg 3 times daily for 2 weeks and colchicine 0.6 mg once daily.  Suspect he was placed on aspirin due to nephrectomy.  Echocardiogram did not show significant effusion.  ESR 25, CRP 1.7.  CTA negative for PE.  Did show evidence of aortitis.  Symptoms of pleuritic chest pain occurred after his thyroidectomy November 2024.  He was discharged on 05/15/2023.  He returns back to the emergency room for severe pleuritic, positional chest pain that started Sunday after singing at church.  Stat echocardiogram showed small circumferential pericardial effusion with out obvious evidence of tamponade  however had issues with Doppler assessment.  He had prominent septal bounce with dilated IVC raising concern for constrictive pericarditis.  EKG without any acute ST-T wave changes.  First troponin negative.  BNP 64.  Creatinine 1.6.  CTA pending.  X-ray negative.  BP is in the 130s systolic.  Slightly tachycardic at times.  Patient reports he has been compliant with his medications.  Has severe chest pain that is pleuritic that limits full respirations.  He is visibly uncomfortable with any position in bed.  He only speaks in partial sentences due to severe pain.  Otherwise denied any significant peripheral edema, dizziness, syncope.   Past Medical History:  Diagnosis Date   AICD (automatic cardioverter/defibrillator) present    Abbott/St Jude   Arthritis    knees, shoulders, elbows   Bladder cancer [facility])    urologist-  dr Mena Goes   CHF (congestive heart failure) (HCC)    Diverticulosis of colon    Fibromyalgia    GERD (gastroesophageal reflux disease)    History of diverticulitis of colon    History of gastric ulcer    due to aleve   Hypertension    Lower urinary tract symptoms (LUTS)    OSA (obstructive sleep apnea)    Wears CPAP nightly   PONV (postoperative nausea and vomiting)    Psoriasis    Sepsis (HCC)    january 2021   Tinnitus    right ear more, has tranmitter in right ear removable at hs   Wears glasses    Wears partial dentures     Past Surgical History:  Procedure Laterality Date   APPENDECTOMY     BIV ICD INSERTION CRT-D N/A 03/01/2023   Procedure: BIV ICD INSERTION CRT-D;  Surgeon: Mealor, Roberts Gaudy, MD;  Location: [facility] INVASIVE CV LAB;  Service: Cardiovascular;  Laterality: N/A;   COLECTOMY W/ COLOSTOMY  1996   W/   APPENDECTOMY   COLONOSCOPY N/A 05/11/2018   Procedure: COLONOSCOPY;  Surgeon: Corbin Ade, MD;  [facility];  Service: Endoscopy;  Laterality: N/A;  9:30   COLOSTOMY TAKEDOWN  1996   CYSTOSCOPY WITH BIOPSY N/A 12/05/2013    Procedure: CYSTO BLADDER BIOPSY AND FULGERATION;  Surgeon: Jerilee Field, MD;  Location: [facility];  Service: Urology;  Laterality: N/A;   CYSTOSCOPY WITH BIOPSY Bilateral 11/13/2014   Procedure: CYSTOSCOPY WITH  BLADDER BIOPSY FULGERATION AND BILATERAL RETROGRADE PYELOGRAMS;  Surgeon: Jerilee Field, MD;  Location: [facility];  Service: Urology;  Laterality: Bilateral;   CYSTOSCOPY WITH FULGERATION N/A 01/18/2018   Procedure: CYSTOSCOPY WITH FULGERATION/ BLADDER BIOPSY;  Surgeon: Jerilee Field, MD;  Location: [facility];  Service: Urology;  Laterality: N/A;   CYSTOSCOPY WITH INJECTION N/A 11/09/2018   Procedure: CYSTOSCOPY WITH INJECTION OF INDOCYANINE GREEN DYE;  Surgeon: Sebastian Ache, MD;  Location: [facility];  Service: Urology;  Laterality: N/A;   CYSTOSCOPY WITH INSERTION OF UROLIFT N/A 01/18/2018   Procedure: CYSTOSCOPY WITH INSERTION OF UROLIFT;  Surgeon: Jerilee Field, MD;  Location: [facility];  Service: Urology;  Laterality: N/A;   CYSTOSCOPY/URETEROSCOPY/HOLMIUM LASER Left 02/17/2019   Procedure: URETEROSCOPY WITH BALLOON DILATION  AND NEPHROSTOGRAM;  Surgeon: Sebastian Ache, MD;  Location: [facility];  Service: Urology;  Laterality: Left;  1 HR   EXCISION RIGHT UPPER ARM LIPOMA  2005   HEMORROIDECTOMY     INGUINAL HERNIA REPAIR Left 1984   IR NEPHROSTOMY PLACEMENT LEFT  01/05/2019   KNEE ARTHROSCOPY Left X3  LAST ONE  2002   ORIF LEFT HUMEROUS FX  1976   POLYPECTOMY  05/11/2018   Procedure: POLYPECTOMY;  Surgeon: Corbin Ade, MD;  [facility];  Service: Endoscopy;;   PROSTATE SURGERY     RIGHT HEART CATH AND CORONARY ANGIOGRAPHY N/A 10/09/2022   Procedure: RIGHT HEART CATH AND CORONARY ANGIOGRAPHY;  Surgeon: [name], MD;  Location: [facility] INVASIVE CV LAB;  Service: Cardiovascular;  Laterality: N/A;   ROBOT ASSISTED LAPAROSCOPIC NEPHRECTOMY Left 07/14/2019   Procedure: XI  ROBOTIC ASSISTED LAPAROSCOPIC RETROPERITONEAL NEPHRECTOMY;  Surgeon: Sebastian Ache, MD;  Location: [facility];  Service: Urology;  Laterality: Left;  3 HRS   THYROIDECTOMY Bilateral 04/14/2023   Procedure: TOTAL THYROIDECTOMY;  Surgeon: [name], MD;  Location: [facility];  Service: ENT;  Laterality: Bilateral;   TONSILLECTOMY  AS CHILD   TOTAL KNEE ARTHROPLASTY Left 2006   REVISION 2007  (AFTER I & D WITH ANTIBIOTIC SPACER PROCEDURE FOR STEPH INFECTION)   TOTAL KNEE ARTHROPLASTY Right 03/30/2016   Procedure: RIGHT TOTAL KNEE ARTHROPLASTY;  Surgeon: Durene Romans, MD;  Location: [facility];  Service: Orthopedics;  Laterality: Right;   TOTAL SHOULDER ARTHROPLASTY Right 04/06/2013   Procedure: RIGHT TOTAL SHOULDER ARTHROPLASTY;  Surgeon: Senaida Lange, MD;  Location: [facility];  Service: Orthopedics;  Laterality: Right;   TOTAL SHOULDER ARTHROPLASTY Left 07/20/2013   Procedure: LEFT TOTAL SHOULDER ARTHROPLASTY;  Surgeon: Senaida Lange, MD;  Location: [facility];  Service: Orthopedics;  Laterality: Left;   TRANSURETHRAL RESECTION OF BLADDER TUMOR N/A 11/12/2015   Procedure: TRANSURETHRAL RESECTION OF  BLADDER TUMOR (TURBT);  Surgeon: Jerilee Field, MD;  Location: [facility];  Service: Urology;  Laterality: N/A;   TRANSURETHRAL RESECTION OF BLADDER TUMOR WITH GYRUS (TURBT-GYRUS) N/A 12/05/2013   Procedure: TRANSURETHRAL RESECTION OF BLADDER TUMOR WITH GYRUS (TURBT-GYRUS);  Surgeon: Jerilee Field, MD;  Location: [facility];  Service: Urology;  Laterality: N/A;     Inpatient Medications: Scheduled Meds:  Continuous Infusions:  PRN Meds:   Allergies:   No Known Allergies  Social History:   Social History   Socioeconomic History   Marital status: Married    Spouse name: Peggy   Number of children: 2   Years of education: 14   Highest education level: Associate degree: occupational, Scientist, product/process development, or vocational program  Occupational History   Occupation: retired    Comment:  [employer], 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   Tobacco Use   Smoking status: Former    Current packs/day: 0.00    Average packs/day: 2.0 packs/day for 40.0 years (80.0 ttl pk-yrs)    Types: Cigarettes    Start date: 06/01/1957    Quit date: 06/01/1997    Years since quitting: 25.9   Smokeless tobacco: Never  Vaping Use   Vaping status: Never Used  Substance and Sexual Activity   Alcohol use: No   Drug use: No   Sexual activity: Not Currently  Other Topics Concern   Not on file  Social History Narrative   One level home with wife    [id number]/[id number] - 65 year old MIL lives with them   Caffiene 2 cups daily, tea occas.   Retired, Visual merchandiser   Social Drivers of Corporate investment banker Strain: Medium Risk (12/11/2022)   Overall Financial Resource Strain (CARDIA)    Difficulty of Paying Living Expenses: Somewhat hard  Food Insecurity: No Food Insecurity (05/13/2023)   Hunger Vital Sign    Worried About Running Out of Food in the Last Year: Never true    Ran Out of Food in the Last Year: Never true  Transportation Needs: No Transportation Needs (05/13/2023)   PRAPARE - Administrator, Civil Service (Medical): No    Lack of Transportation (Non-Medical): No  Physical Activity: Inactive (12/11/2022)   Exercise Vital Sign    Days of Exercise per Week: 0 days    Minutes of Exercise per Session: 0 min  Stress: No Stress Concern Present (12/11/2022)   [employer] of Occupational Health - Occupational Stress Questionnaire    Feeling of Stress : Not at all  Social Connections: Socially Integrated (12/11/2022)   Social Connection and Isolation Panel [NHANES]    Frequency of Communication with Friends and Family: Three times a week    Frequency of Social Gatherings with Friends and Family: Once a week    Attends Religious Services: 1 to 4 times per year    Active Member of [employer] or Organizations: Yes    Attends Banker Meetings: 1 to 4 times per year    Marital Status: Married   Catering manager Violence: Not At Risk (05/13/2023)   Humiliation, Afraid, Rape, and Kick questionnaire    Fear of Current or Ex-Partner: No    Emotionally Abused: No    Physically Abused: No    Sexually Abused: No    Family History:   Family History  Problem Relation Age of Onset   Alzheimer's disease Mother    Heart attack Father    Heart disease Father    Irregular heart beat Brother  DEFIB.   Congestive Heart Failure Brother    Diabetes Daughter    Sleep apnea Neg Hx      ROS:  Please see the history of present illness.  All other ROS reviewed and negative.     Physical Exam/Data:   Vitals:   05/19/23 1023 05/19/23 1431  BP: 136/76 130/75  Pulse: 87 (!) 46  Resp: 17 (!) 22  Temp: 98.6 F (37 C)   SpO2: 99% 97%   No intake or output data in the 24 hours ending 05/19/23 1438    05/17/2023   11:51 AM 05/13/2023    6:52 PM 04/14/2023    7:11 AM  Last 3 Weights  Weight (lbs) 218 lb 217 lb 9.5 oz 216 lb  Weight (kg) 98.884 kg 98.7 kg 97.977 kg     There is no height or weight on file to calculate BMI.  General: Visibly in discomfort and pain HEENT: normal Neck: no JVD Vascular: No carotid bruits; Distal pulses 2+ bilaterally Cardiac:  normal S1, S2; RRR; no murmur  Lungs: Crackles in lower base Abd: soft, nontender, no hepatomegaly  Ext: no edema Musculoskeletal:  No deformities, BUE and BLE strength normal and equal Skin: warm and dry  Neuro:  CNs 2-12 intact, no focal abnormalities noted Psych:  Normal affect   EKG:  The EKG was personally reviewed and demonstrates: Biventricularly paced, heart rate 97.  Frequent PVCs Telemetry:  Telemetry was personally reviewed and demonstrates: Biventricular pacing  Relevant CV Studies: Echocardiogram 05/19/2023 1. Left ventricular ejection fraction, by estimation, is 25 to 30%. The  left ventricle has severely decreased function. The left ventricle  demonstrates global hypokinesis. Left ventricular  diastolic parameters are  consistent with Grade I diastolic  dysfunction (impaired relaxation).   2. Right ventricular systolic function is mildly reduced. The right  ventricular size is normal.   3. Right atrial size was mildly dilated.   4. A small pericardial effusion is present. The pericardial effusion is  circumferential.   5. The mitral valve is normal in structure. Trivial mitral valve  regurgitation.   6. The aortic valve is tricuspid. There is mild calcification of the  aortic valve. Aortic valve regurgitation is not visualized.   7. Aortic dilatation noted. There is moderate dilatation of the ascending  aorta, measuring 45 mm.   8. The inferior vena cava is dilated in size with <50% respiratory  variability, suggesting right atrial pressure of 15 mmHg.   Right left heart catheterization 10/09/2022 Prox RCA lesion is 30% stenosed.   1.  Mild diffuse coronary artery disease. 2.  Fick cardiac output of 7.89 L/min and Fick cardiac index of 3.85 L/min/m with the following hemodynamics:            Right atrial pressure mean of 6 mmHg            Right ventricular pressure 50/3 with a end-diastolic pressure of 6 mmHg            Wedge pressure mean of 27 mmHg            Pulmonary artery pressure 55/26 with a mean of 37 mmHg            Pulmonary vascular resistance of 1.84 Woods units consistent with pulmonary venous hypertension            Pulmonary artery pulsatility index of 4.1 3.  LVEDP assessment was deferred due to the presence of left ventricular thrombus.   Recommendation: Medical therapy.  Laboratory Data:  High Sensitivity Troponin:   Recent Labs  Lab 05/13/23 0850 05/13/23 1129 05/19/23 1214  TROPONINIHS 13 13 17      Chemistry Recent Labs  Lab 05/13/23 0850 05/13/23 1129 05/15/23 0441 05/19/23 1214 05/19/23 1219  NA 135  --  138 136  --   K 3.8  --  4.1 4.2  --   CL 101  --  101 101  --   CO2 24  --  26 20*  --   GLUCOSE 88  --  87 92  --   BUN 22   --  26* 23  --   CREATININE 1.32*  --  1.49* 1.55* 1.60*  CALCIUM 9.3  --  9.4 8.9  --   MG  --  2.1 2.3 1.9  --   GFRNONAA 56*  --  49* 46*  --   ANIONGAP 10  --  11 15  --     Recent Labs  Lab 05/19/23 1214  PROT 7.7  ALBUMIN 3.9  AST 33  ALT 16  ALKPHOS 76  BILITOT 0.9   Lipids No results for input(s): "CHOL", "TRIG", "HDL", "LABVLDL", "LDLCALC", "CHOLHDL" in the last 168 hours.  Hematology Recent Labs  Lab 05/13/23 0850 05/15/23 0441 05/19/23 1214  WBC 8.1 5.9 9.6  RBC 4.60 4.64 4.35  HGB 14.3 14.3 13.4  HCT 42.2 42.8 40.5  MCV 91.7 92.2 93.1  MCH 31.1 30.8 30.8  MCHC 33.9 33.4 33.1  RDW 13.7 14.0 14.3  PLT 179 174 186   Thyroid No results for input(s): "TSH", "FREET4" in the last 168 hours.  BNP Recent Labs  Lab 05/13/23 0850 05/19/23 1207  BNP 88.0 64.6    DDimer  Recent Labs  Lab 05/13/23 0850  DDIMER 1.28*     Radiology/Studies:  DG Chest Portable 1 View Result Date: 05/19/2023 CLINICAL DATA:  Chest pain.  Shortness of breath. EXAM: PORTABLE CHEST 1 VIEW COMPARISON:  Chest radiograph dated May 13, 2023. FINDINGS: Stable mild cardiomegaly. Stable left-sided multi lead cardiac device in place. No focal consolidation, sizeable pleural effusion, or pneumothorax. Bilateral shoulder arthroplasties. No acute osseous abnormality. IMPRESSION: No active disease. Electronically Signed   By: Hart Robinsons M.D.   On: 05/19/2023 13:29   ECHOCARDIOGRAM COMPLETE Result Date: 05/19/2023    ECHOCARDIOGRAM REPORT   Patient Name:   Billy Rasmussen Date of Exam: 05/19/2023 Medical Rec #:  [id number]        Height:       66.0 in Accession #:    [id number]       Weight:       218.0 lb Date of Birth:  08-19-1945         BSA:          2.074 m Patient Age:    77 years         BP:           103/67 mmHg Patient Gender: M                HR:           78 bpm. Exam Location:  Inpatient Procedure: 2D Echo, Color Doppler and Cardiac Doppler STAT ECHO Indications:    Chest  pain, Asc Aortic Aneurysm, Pericardial Effusion  History:        Patient has prior history of Echocardiogram examinations, most                 recent 05/14/2023. Risk  Factors:Hypertension, Dyslipidemia and                 Sleep Apnea.  Sonographer:    Irving Burton Senior RDCS Referring Phys: Robet Leu, R IMPRESSIONS  1. Left ventricular ejection fraction, by estimation, is 25 to 30%. The left ventricle has severely decreased function. The left ventricle demonstrates global hypokinesis. Left ventricular diastolic parameters are consistent with Grade I diastolic dysfunction (impaired relaxation).  2. Right ventricular systolic function is mildly reduced. The right ventricular size is normal.  3. Right atrial size was mildly dilated.  4. A small pericardial effusion is present. The pericardial effusion is circumferential.  5. The mitral valve is normal in structure. Trivial mitral valve regurgitation.  6. The aortic valve is tricuspid. There is mild calcification of the aortic valve. Aortic valve regurgitation is not visualized.  7. Aortic dilatation noted. There is moderate dilatation of the ascending aorta, measuring 45 mm.  8. The inferior vena cava is dilated in size with <50% respiratory variability, suggesting right atrial pressure of 15 mmHg. Conclusion(s)/Recommendation(s): There is a small circumfrential pericardial effusion without overt evidence for tamponade (patient in atrial fibrillation making Doppler assessment difficult). However there is a prominent septal bounce with dilated IVC which raises the possibility of effusive constrrictive pericarditis. FINDINGS  Left Ventricle: Left ventricular ejection fraction, by estimation, is 25 to 30%. The left ventricle has severely decreased function. The left ventricle demonstrates global hypokinesis. The left ventricular internal cavity size was normal in size. There is no left ventricular hypertrophy. Left ventricular diastolic parameters are consistent with  Grade I diastolic dysfunction (impaired relaxation). Right Ventricle: The right ventricular size is normal. No increase in right ventricular wall thickness. Right ventricular systolic function is mildly reduced. Left Atrium: Left atrial size was normal in size. Right Atrium: Right atrial size was mildly dilated. Pericardium: A small pericardial effusion is present. The pericardial effusion is circumferential. Mitral Valve: The mitral valve is normal in structure. Trivial mitral valve regurgitation. Tricuspid Valve: The tricuspid valve is normal in structure. Tricuspid valve regurgitation is trivial. Aortic Valve: The aortic valve is tricuspid. There is mild calcification of the aortic valve. Aortic valve regurgitation is not visualized. Pulmonic Valve: The pulmonic valve was not well visualized. Pulmonic valve regurgitation is not visualized. Aorta: Aortic dilatation noted. There is moderate dilatation of the ascending aorta, measuring 45 mm. Venous: The inferior vena cava is dilated in size with less than 50% respiratory variability, suggesting right atrial pressure of 15 mmHg. IAS/Shunts: No atrial level shunt detected by color flow Doppler. Additional Comments: A device lead is visualized.  LEFT VENTRICLE PLAX 2D LVIDd:         5.10 cm LVIDs:         4.80 cm LV PW:         0.90 cm LV IVS:        0.80 cm LVOT diam:     2.50 cm LV SV:         80 LV SV Index:   39 LVOT Area:     4.91 cm  LV Volumes (MOD) LV vol d, MOD A2C: 199.0 ml LV vol d, MOD A4C: 187.0 ml LV vol s, MOD A2C: 164.0 ml LV vol s, MOD A4C: 136.0 ml LV SV MOD A2C:     35.0 ml LV SV MOD A4C:     187.0 ml LV SV MOD BP:      45.2 ml RIGHT VENTRICLE RV S prime:     17.70  cm/s TAPSE (M-mode): 2.1 cm LEFT ATRIUM             Index        RIGHT ATRIUM           Index LA diam:        3.70 cm 1.78 cm/m   RA Area:     29.50 cm LA Vol (A2C):   60.3 ml 29.04 ml/m  RA Volume:   102.00 ml 49.17 ml/m LA Vol (A4C):   41.7 ml 20.10 ml/m LA Biplane Vol: 53.4 ml  25.74 ml/m  AORTIC VALVE LVOT Vmax:   92.27 cm/s LVOT Vmean:  68.200 cm/s LVOT VTI:    0.163 m  AORTA Ao Root diam: 3.70 cm Ao Asc diam:  4.45 cm  SHUNTS Systemic VTI:  0.16 m Systemic Diam: 2.50 cm Arvilla Meres MD Electronically signed by Arvilla Meres MD Signature Date/Time: 05/19/2023/1:25:08 PM    Final      Assessment and Plan:   Pericarditis possible constrictive pericardial effusion Patient recently discharged 05/15/2023 for pericarditis with no significant effusion noted on echocardiogram.  Discharged on aspirin 1000 mg 3 times daily (history of nephrectomy), colchicine 0.6 mg daily.  He returns back for worsening and severe pleuritic positional chest pain that started Sunday.  Echocardiogram this admission concerning for constrictive pericarditis due to prominent septal bounce with dilated IVC.  No obvious signs of tamponade although limited Doppler study.  Also has moderate dilatation of ascending aorta (45 mm).  EKG without any PR depressions or ST elevation. Fortunately vital signs appear to be stable, borderline tachycardic. Negative first troponin.  No indication for pericardiocentesis, currently.  Its odd he would developed progressive effusion despite being on appropriate therapy and compliant, may need to consider rheum etiology or other secondary causes.  Repeat ESR/CRP. ESR 25, CRP 1.7 previously Increase colchicine 0.6 mg from daily to BID, continue aspirin 1000mg .  Very cautious use with diuretics Will need repeat echo at some point.  May consider respiratory panel.   Aortitis  Discussed with MD may need rheumatology input.   Chronic combined nonischemic cardiomyopathy 25-30% Status post CRT-D May 2024 right and left heart catheterization that did not show significant CAD. He had (30% RCA stenosis, RA 6 RV 50/3, PA 55/26/37 PCWP 27 CI 3.85.) Also has mildly reduced RV function.  Some pulmonary congestion on physical exam otherwise euvolemic. PTA Losartan 25 mg,  Toprol-XL 25 mg, spironolactone 25 mg.  Avoiding SGLT2 inhibitor due to recurrent UTIs.  Ascending aorta aneurysm (4.5 cm) Continue to monitor.   CKD Creatinine 1.6.  Baseline appears to be 1.3-1.8  Thyroid cancer Status post thyroidectomy in November 2024 with plans for radiation. Checking TSH, normal 8 months ago.   Bladder cancer status post left nephrectomy and cystoprostatectomy Management per primary team   Risk Assessment/Risk Scores:   New York Heart Association (NYHA) Functional Class NYHA Class II        For questions or updates, please contact [facility] Please consult [email or web address] for contact info under    Signed, Abagail Kitchens, PA-C  05/19/2023 2:38 PM

## 2023-05-19 NOTE — ED Provider Triage Note (Signed)
Emergency Medicine Provider Triage Evaluation Note  ZAKARIYAH DEAMER , a 77 y.o. male  was evaluated in triage.  Pt complains of CP and SOB x3d.h/o CAD and HFrEF. Recent admission for HF  Review of Systems  Positive: CP SOB Negative: Fevers chills n v  Physical Exam  BP 136/76 (BP Location: Right Arm)   Pulse 87   Temp 98.6 F (37 C)   Resp 17   SpO2 99%  Gen:   Awake, no distress   Resp:  Normal effort  MSK:   Moves extremities without difficulty  Other:    Medical Decision Making  Medically screening exam initiated at 11:20 AM.  Appropriate orders placed.  WESTLEY SIMONELLI was informed that the remainder of the evaluation will be completed by another provider, this initial triage assessment does not replace that evaluation, and the importance of remaining in the ED until their evaluation is complete.  Active CP. High Risk. Immediate room requested from triage. Remainder of care per ED team   Royanne Foots, DO 05/19/23 1121

## 2023-05-19 NOTE — ED Notes (Signed)
Report given to Gus Rankin, RN

## 2023-05-19 NOTE — ED Notes (Signed)
[facility] has been called for transport.  

## 2023-05-19 NOTE — ED Notes (Signed)
RN to draw 2nd trop from access.

## 2023-05-19 NOTE — ED Provider Notes (Signed)
[place] EMERGENCY DEPARTMENT AT [facility] Provider Note   CSN: [id number] Arrival date & time: 05/19/23  1006     History  Chief Complaint  Patient presents with   Chest Pain   Shortness of Breath   Neck Pain    HARLES Rasmussen is a 77 y.o. 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  States the chest pain and shortness of breath have been on and off since he got out of the hospital, but worse today.  Also reports left-sided neck pain that started this morning.     Chest Pain Associated symptoms: shortness of breath   Shortness of Breath Associated symptoms: chest pain and neck pain   Neck Pain Associated symptoms: chest pain        Home Medications Prior to Admission medications   Medication Sig Start Date End Date Taking? Authorizing Provider  acetaminophen (TYLENOL) 650 MG CR tablet Take 1,300 mg by mouth daily as needed for pain.   Yes [provider]  Albuterol-Budesonide (AIRSUPRA) 90-80 MCG/ACT AERO Inhale 2 puffs into the lungs every 6 (six) hours as needed (shortness of breath/ wheeze). 03/16/23  Yes Delynn Flavin M, DO  aspirin 325 MG tablet Take 3 tablets (975 mg total) by mouth 3 (three) times daily for 12 days, THEN 2 tablets (650 mg total) 3 (three) times daily for 7 days, THEN 1 tablet (325 mg total) 3 (three) times daily for 7 days. 05/15/23 06/10/23 Yes Shah, Pratik D, DO  atorvastatin (LIPITOR) 20 MG tablet Take 1 tablet (20 mg total) by mouth daily. 10/23/22 05/19/23 Yes Little Ishikawa, MD  colchicine 0.6 MG tablet Take 1 tablet (0.6 mg total) by mouth daily. 05/16/23 08/14/23 Yes Shah, Pratik D, DO  fluticasone (FLONASE) 50 MCG/ACT nasal spray Place 1 spray into both nostrils daily. 02/03/23  Yes [provider]  furosemide (LASIX) 20 MG tablet Take 1  tablet (20 mg) daily as needed if you gain more than 3 pounds in 1 day or 5 pounds in 1 week. 10/29/22  Yes Robbie Lis M, PA-C  levothyroxine (SYNTHROID) 100 MCG tablet Take 1 tablet (100 mcg total) by mouth daily. 04/15/23  Yes [name], MD  losartan (COZAAR) 25 MG tablet Take 1 tablet (25 mg total) by mouth daily. 12/14/22 05/19/23 Yes Lennette Bihari, MD  metoprolol succinate (TOPROL-XL) 25 MG 24 hr tablet Take 1 tablet (25 mg total) by mouth daily. 12/14/22  Yes Delynn Flavin M, DO  Multiple Vitamin (MULTIVITAMIN) capsule Take 1 capsule by mouth daily.   Yes [provider]  polyvinyl alcohol (LIQUIFILM TEARS) 1.4 % ophthalmic solution Place 1 drop into both eyes as needed for dry eyes. 05/16/19  Yes Sheth, Devam P, MD  spironolactone (ALDACTONE) 25 MG tablet Take 12.5 mg by mouth daily. 05/18/23  Yes [provider]  levothyroxine (SYNTHROID) 25 MCG tablet Take 1 tablet (25 mcg total) by mouth daily before breakfast. Take on an empty stomach with water only.  Wait to consume other foods/ drinks/ meds for 30 minutes. Patient not taking: Reported on 05/19/2023 08/24/22   Raliegh Ip, DO      Allergies    Patient has no known allergies.    Review of Systems   Review of Systems  Respiratory:  Positive for shortness of breath.   Cardiovascular:  Positive for  chest pain.  Musculoskeletal:  Positive for neck pain.    Physical Exam Updated Vital Signs BP 130/75   Pulse (!) 46   Temp 98.6 F (37 C)   Resp (!) 22   SpO2 97%  Physical Exam Vitals and nursing note reviewed.  Constitutional:      General: He is not in acute distress.    Appearance: He is well-developed.  HENT:     Head: Normocephalic and atraumatic.  Eyes:     Conjunctiva/sclera: Conjunctivae normal.  Neck:     Comments: No JVD noted Cardiovascular:     Rate and Rhythm: Normal rate and regular rhythm.     Heart sounds: Murmur heard.     Comments: No carotid bruits  noted  Radial pulse 2+ bilaterally Pulmonary:     Effort: Pulmonary effort is normal. No respiratory distress.     Breath sounds: Normal breath sounds.  Abdominal:     Palpations: Abdomen is soft.     Tenderness: There is no abdominal tenderness.  Musculoskeletal:        General: No swelling.     Cervical back: Neck supple.     Right lower leg: No edema.     Left lower leg: No edema.  Skin:    General: Skin is warm and dry.     Capillary Refill: Capillary refill takes less than 2 seconds.  Neurological:     Mental Status: He is alert.  Psychiatric:        Mood and Affect: Mood normal.     ED Results / Procedures / Treatments   Labs (all labs ordered are listed, but only abnormal results are displayed) Labs Reviewed  COMPREHENSIVE METABOLIC PANEL - Abnormal; Notable for the following components:      Result Value   CO2 20 (*)    Creatinine, Ser 1.55 (*)    GFR, Estimated 46 (*)    All other components within normal limits  I-STAT CREATININE, ED - Abnormal; Notable for the following components:   Creatinine, Ser 1.60 (*)    All other components within normal limits  CBC WITH DIFFERENTIAL/PLATELET  BRAIN NATRIURETIC PEPTIDE  MAGNESIUM  TROPONIN I (HIGH SENSITIVITY)  TROPONIN I (HIGH SENSITIVITY)    EKG EKG Interpretation Date/Time:  Wednesday May 19 2023 14:27:20 EST Ventricular Rate:  97 PR Interval:  125 QRS Duration:  116 QT Interval:  359 QTC Calculation: 456 R Axis:   101  Text Interpretation: Ventricular-paced rhythm No further analysis attempted due to paced rhythm Confirmed by Alvester Chou [phone]) on 05/19/2023 2:36:06 PM  Radiology DG Chest Portable 1 View Result Date: 05/19/2023 CLINICAL DATA:  Chest pain.  Shortness of breath. EXAM: PORTABLE CHEST 1 VIEW COMPARISON:  Chest radiograph dated May 13, 2023. FINDINGS: Stable mild cardiomegaly. Stable left-sided multi lead cardiac device in place. No focal consolidation, sizeable pleural  effusion, or pneumothorax. Bilateral shoulder arthroplasties. No acute osseous abnormality. IMPRESSION: No active disease. Electronically Signed   By: Hart Robinsons M.D.   On: 05/19/2023 13:29   ECHOCARDIOGRAM COMPLETE Result Date: 05/19/2023    ECHOCARDIOGRAM REPORT   Patient Name:   Billy Rasmussen Date of Exam: 05/19/2023 Medical Rec #:  [id number]        Height:       66.0 in Accession #:    [id number]       Weight:       218.0 lb Date of Birth:  [birth year]         BSA:  2.074 m Patient Age:    77 years         BP:           103/67 mmHg Patient Gender: M                HR:           78 bpm. Exam Location:  Inpatient Procedure: 2D Echo, Color Doppler and Cardiac Doppler STAT ECHO Indications:    Chest pain, Asc Aortic Aneurysm, Pericardial Effusion  History:        Patient has prior history of Echocardiogram examinations, most                 recent 05/14/2023. Risk Factors:Hypertension, Dyslipidemia and                 Sleep Apnea.  Sonographer:    Irving Burton Senior RDCS Referring Phys: Robet Leu, R IMPRESSIONS  1. Left ventricular ejection fraction, by estimation, is 25 to 30%. The left ventricle has severely decreased function. The left ventricle demonstrates global hypokinesis. Left ventricular diastolic parameters are consistent with Grade I diastolic dysfunction (impaired relaxation).  2. Right ventricular systolic function is mildly reduced. The right ventricular size is normal.  3. Right atrial size was mildly dilated.  4. A small pericardial effusion is present. The pericardial effusion is circumferential.  5. The mitral valve is normal in structure. Trivial mitral valve regurgitation.  6. The aortic valve is tricuspid. There is mild calcification of the aortic valve. Aortic valve regurgitation is not visualized.  7. Aortic dilatation noted. There is moderate dilatation of the ascending aorta, measuring 45 mm.  8. The inferior vena cava is dilated in size with <50% respiratory  variability, suggesting right atrial pressure of 15 mmHg. Conclusion(s)/Recommendation(s): There is a small circumfrential pericardial effusion without overt evidence for tamponade (patient in atrial fibrillation making Doppler assessment difficult). However there is a prominent septal bounce with dilated IVC which raises the possibility of effusive constrrictive pericarditis. FINDINGS  Left Ventricle: Left ventricular ejection fraction, by estimation, is 25 to 30%. The left ventricle has severely decreased function. The left ventricle demonstrates global hypokinesis. The left ventricular internal cavity size was normal in size. There is no left ventricular hypertrophy. Left ventricular diastolic parameters are consistent with Grade I diastolic dysfunction (impaired relaxation). Right Ventricle: The right ventricular size is normal. No increase in right ventricular wall thickness. Right ventricular systolic function is mildly reduced. Left Atrium: Left atrial size was normal in size. Right Atrium: Right atrial size was mildly dilated. Pericardium: A small pericardial effusion is present. The pericardial effusion is circumferential. Mitral Valve: The mitral valve is normal in structure. Trivial mitral valve regurgitation. Tricuspid Valve: The tricuspid valve is normal in structure. Tricuspid valve regurgitation is trivial. Aortic Valve: The aortic valve is tricuspid. There is mild calcification of the aortic valve. Aortic valve regurgitation is not visualized. Pulmonic Valve: The pulmonic valve was not well visualized. Pulmonic valve regurgitation is not visualized. Aorta: Aortic dilatation noted. There is moderate dilatation of the ascending aorta, measuring 45 mm. Venous: The inferior vena cava is dilated in size with less than 50% respiratory variability, suggesting right atrial pressure of 15 mmHg. IAS/Shunts: No atrial level shunt detected by color flow Doppler. Additional Comments: A device lead is visualized.   LEFT VENTRICLE PLAX 2D LVIDd:         5.10 cm LVIDs:         4.80 cm LV PW:  0.90 cm LV IVS:        0.80 cm LVOT diam:     2.50 cm LV SV:         80 LV SV Index:   39 LVOT Area:     4.91 cm  LV Volumes (MOD) LV vol d, MOD A2C: 199.0 ml LV vol d, MOD A4C: 187.0 ml LV vol s, MOD A2C: 164.0 ml LV vol s, MOD A4C: 136.0 ml LV SV MOD A2C:     35.0 ml LV SV MOD A4C:     187.0 ml LV SV MOD BP:      45.2 ml RIGHT VENTRICLE RV S prime:     17.70 cm/s TAPSE (M-mode): 2.1 cm LEFT ATRIUM             Index        RIGHT ATRIUM           Index LA diam:        3.70 cm 1.78 cm/m   RA Area:     29.50 cm LA Vol (A2C):   60.3 ml 29.04 ml/m  RA Volume:   102.00 ml 49.17 ml/m LA Vol (A4C):   41.7 ml 20.10 ml/m LA Biplane Vol: 53.4 ml 25.74 ml/m  AORTIC VALVE LVOT Vmax:   92.27 cm/s LVOT Vmean:  68.200 cm/s LVOT VTI:    0.163 m  AORTA Ao Root diam: 3.70 cm Ao Asc diam:  4.45 cm  SHUNTS Systemic VTI:  0.16 m Systemic Diam: 2.50 cm Arvilla Meres MD Electronically signed by Arvilla Meres MD Signature Date/Time: 05/19/2023/1:25:08 PM    Final     Procedures Procedures    Medications Ordered in ED Medications  HYDROmorphone (DILAUDID) injection 1 mg (1 mg Intravenous Given 05/19/23 1206)  iohexol (OMNIPAQUE) 350 MG/ML injection 85 mL (85 mLs Intravenous Contrast Given 05/19/23 1419)    ED Course/ Medical Decision Making/ A&P Clinical Course as of 05/19/23 1528  Wed May 19, 2023  1202 This is a 77 year old male with a history of a pacemaker, recent hospitalization 1 week ago for pericarditis and aortitis, presenting to the ED with worsening chest and neck pain on the left side of his neck.  Patient reports he did have the symptoms while in the hospital but they have really worsened in the past 2 days become intolerable.  On exam vitals are unremarkable.  He does have a systolic murmur.  He appears uncomfortable.  He has equal bilateral radial pulses.  Differential would include recurrent or persistent aortitis  versus worsening pericarditis versus aortic dissection versus other.  The patient does have known history of a 4.5 cm aneurysm on his CT scan performed 1 week ago, as well as chronic kidney disease and a single kidney.  We are awaiting blood test, i-STAT creatinine check, if his GFR permits I would prefer to do a repeat dissection study.  We will consult with cardiology.  His EKG shows a V paced rhythm with occasional ectopy or PVCs per my interpretation. [MT]  1218 Spoke to [employer], who is arranging for a stat echo and will consult on patient [MT]  1322 Troponin I (High Sensitivity): 17 [MT]    Clinical Course User Index [MT] Trifan, Kermit Balo, MD                                 Medical Decision Making Amount and/or Complexity of Data Reviewed Labs:  Decision-making  details documented in ED Course. Radiology: ordered.  Risk Prescription drug management.     Differential diagnosis includes but is not limited to ACS, pericarditis, pericardial effusion, aortic dissection, aortitis, CHF, pneumonia  ED Course:  Patient uncomfortable appearing upon initial examination, he has worsening chest pain and neck pain over the last couple of days. Vital signs stable. He does have known ascending aortic aneurysm at 4.5cm on CT from 05/13/23 and was recently treated for pericarditis and aortitis. Cardiology was consulted and they will plan to see patient and perform echo for further evaluation in case we are unable to perform dissection study due to an elevated creatinine and patient's one kidney.  Patient given dilaudid for pain Upon re-evaluation, patient feels pain is better controlled with Dilaudid.  Vital signs remained stable and he looks better appearing with pain under control.  Awaiting GFR to determine if CTA chest abdomen and pelvis can be obtained to rule out dissection. CTA chest abdomen pelvis obtained, awaiting results and cardiology consult.  Initial troponin at 17, awaiting repeat  troponin. EKG with paced rhythm.  Low concern for ACS at this time given initial troponin within normal limits and recent history of aortitis and pericarditis could explain his pain.  CMP with elevated creatinine at 1.55, but this seems in line with patient baseline.  CBC unremarkable.  BNP within normal limits, low suspicion for CHF.  Impression: Chest pain and left-sided neck pain  Disposition:  Care of this patient signed out to oncoming ED provider Rhea Bleacher, PA-C.  Plan to follow-up on cardiology recommendations and dissection study results.  Disposition and treatment plan pending imaging results and clinical judgment of oncoming ED team.  .   Imaging Studies ordered: I ordered imaging studies including  chest x ray, CTA chest abdomen pelvis I independently visualized the imaging with scope of interpretation limited to determining acute life threatening conditions related to emergency care. Chest xray without any acute abnormality Awaiting CTA results I agree with the radiologist interpretation     Consultations Obtained: I requested consultation with the Cardiology Dr Robet Leu  and discussed lab and imaging findings as well as pertinent plan -plan for stat echocardiogram and will come evaluate patient and provide recommendations  External records from outside source obtained and reviewed including recent discharge summary from 05/15/23. Reviewed recent CT where 4.5cm aortic aneurysm was demonstrated              Final Clinical Impression(s) / ED Diagnoses Final diagnoses:  Chest pain, unspecified type    Rx / DC Orders ED Discharge Orders     None         Arabella Merles, PA-C 05/19/23 1528    Terald Sleeper, MD 05/19/23 [id number]

## 2023-05-20 ENCOUNTER — Other Ambulatory Visit: Payer: Medicare HMO

## 2023-05-20 DIAGNOSIS — I308 Other forms of acute pericarditis: Secondary | ICD-10-CM | POA: Diagnosis not present

## 2023-05-20 DIAGNOSIS — I776 Arteritis, unspecified: Secondary | ICD-10-CM | POA: Diagnosis not present

## 2023-05-20 DIAGNOSIS — R7982 Elevated C-reactive protein (CRP): Secondary | ICD-10-CM | POA: Diagnosis not present

## 2023-05-20 DIAGNOSIS — Z136 Encounter for screening for cardiovascular disorders: Secondary | ICD-10-CM | POA: Diagnosis not present

## 2023-05-20 DIAGNOSIS — N1831 Chronic kidney disease, stage 3a: Secondary | ICD-10-CM | POA: Diagnosis not present

## 2023-05-20 DIAGNOSIS — I130 Hypertensive heart and chronic kidney disease with heart failure and stage 1 through stage 4 chronic kidney disease, or unspecified chronic kidney disease: Secondary | ICD-10-CM | POA: Diagnosis not present

## 2023-05-20 DIAGNOSIS — I428 Other cardiomyopathies: Secondary | ICD-10-CM | POA: Diagnosis not present

## 2023-05-20 DIAGNOSIS — I5022 Chronic systolic (congestive) heart failure: Secondary | ICD-10-CM | POA: Diagnosis not present

## 2023-05-20 DIAGNOSIS — E890 Postprocedural hypothyroidism: Secondary | ICD-10-CM | POA: Diagnosis not present

## 2023-05-20 DIAGNOSIS — R0600 Dyspnea, unspecified: Secondary | ICD-10-CM | POA: Diagnosis not present

## 2023-05-20 DIAGNOSIS — R700 Elevated erythrocyte sedimentation rate: Secondary | ICD-10-CM | POA: Diagnosis not present

## 2023-05-20 LAB — ANCA TITERS
Atypical P-ANCA titer: <1:20 {titer}
C-ANCA: <1:20 {titer}
P-ANCA: <1:20 {titer}

## 2023-05-21 DIAGNOSIS — I300 Acute nonspecific idiopathic pericarditis: Secondary | ICD-10-CM | POA: Diagnosis not present

## 2023-05-21 DIAGNOSIS — I308 Other forms of acute pericarditis: Secondary | ICD-10-CM | POA: Diagnosis not present

## 2023-05-21 DIAGNOSIS — R7982 Elevated C-reactive protein (CRP): Secondary | ICD-10-CM | POA: Diagnosis not present

## 2023-05-21 DIAGNOSIS — I130 Hypertensive heart and chronic kidney disease with heart failure and stage 1 through stage 4 chronic kidney disease, or unspecified chronic kidney disease: Secondary | ICD-10-CM | POA: Diagnosis not present

## 2023-05-21 DIAGNOSIS — R700 Elevated erythrocyte sedimentation rate: Secondary | ICD-10-CM | POA: Diagnosis not present

## 2023-05-21 DIAGNOSIS — I776 Arteritis, unspecified: Secondary | ICD-10-CM | POA: Diagnosis not present

## 2023-05-21 DIAGNOSIS — E890 Postprocedural hypothyroidism: Secondary | ICD-10-CM | POA: Diagnosis not present

## 2023-05-21 DIAGNOSIS — I428 Other cardiomyopathies: Secondary | ICD-10-CM | POA: Diagnosis not present

## 2023-05-21 DIAGNOSIS — N1831 Chronic kidney disease, stage 3a: Secondary | ICD-10-CM | POA: Diagnosis not present

## 2023-05-21 DIAGNOSIS — I5022 Chronic systolic (congestive) heart failure: Secondary | ICD-10-CM | POA: Diagnosis not present

## 2023-05-21 NOTE — Telephone Encounter (Signed)
Called patient and left VM per Dr. Bjorn Pippin  OK to start radiation for thyroid. Left VM for patient to call office if he had any other questions.

## 2023-05-22 DIAGNOSIS — I300 Acute nonspecific idiopathic pericarditis: Secondary | ICD-10-CM | POA: Diagnosis not present

## 2023-05-22 DIAGNOSIS — N1831 Chronic kidney disease, stage 3a: Secondary | ICD-10-CM | POA: Diagnosis not present

## 2023-05-22 DIAGNOSIS — R7982 Elevated C-reactive protein (CRP): Secondary | ICD-10-CM | POA: Diagnosis not present

## 2023-05-22 DIAGNOSIS — R519 Headache, unspecified: Secondary | ICD-10-CM | POA: Diagnosis not present

## 2023-05-22 DIAGNOSIS — I130 Hypertensive heart and chronic kidney disease with heart failure and stage 1 through stage 4 chronic kidney disease, or unspecified chronic kidney disease: Secondary | ICD-10-CM | POA: Diagnosis not present

## 2023-05-22 DIAGNOSIS — I776 Arteritis, unspecified: Secondary | ICD-10-CM | POA: Diagnosis not present

## 2023-05-22 DIAGNOSIS — E890 Postprocedural hypothyroidism: Secondary | ICD-10-CM | POA: Diagnosis not present

## 2023-05-22 DIAGNOSIS — I308 Other forms of acute pericarditis: Secondary | ICD-10-CM | POA: Diagnosis not present

## 2023-05-22 DIAGNOSIS — I428 Other cardiomyopathies: Secondary | ICD-10-CM | POA: Diagnosis not present

## 2023-05-22 DIAGNOSIS — I5022 Chronic systolic (congestive) heart failure: Secondary | ICD-10-CM | POA: Diagnosis not present

## 2023-05-22 DIAGNOSIS — R700 Elevated erythrocyte sedimentation rate: Secondary | ICD-10-CM | POA: Diagnosis not present

## 2023-05-24 ENCOUNTER — Telehealth: Payer: Self-pay | Admitting: *Deleted

## 2023-05-24 DIAGNOSIS — H524 Presbyopia: Secondary | ICD-10-CM | POA: Diagnosis not present

## 2023-05-24 NOTE — Transitions of Care (Post Inpatient/ED Visit) (Signed)
05/24/2023  Name: Billy Rasmussen MRN: [id number] DOB: 1945/12/10  Today's TOC FU Call Status: Today's TOC FU Call Status:: Successful TOC FU Call Completed TOC FU Call Complete Date: 05/24/23 Patient's Name and Date of Birth confirmed.  Transition Care Management Follow-up Telephone Call Date of Discharge: 05/22/23 Discharge Facility: Other [facility]) Name of Other (Non-[facility]) Discharge Facility: [facility] Type of Discharge: Inpatient Admission Primary Inpatient Discharge Diagnosis:: Aortitis How have you been since you were released from the hospital?: Better (ambulating well, has walker on hand, not using, taking meds as prescribed, appetite is fair, bowel movements small amounts q 3 days) Any questions or concerns?: No  Items Reviewed: Did you receive and understand the discharge instructions provided?: Yes Medications obtained,verified, and reconciled?: Yes (Medications Reviewed) Any new allergies since your discharge?: No Dietary orders reviewed?: Yes Type of Diet Ordered:: low sodium, heart healthy Do you have support at home?: Yes People in Home: spouse Name of Support/Comfort Primary Source: Pearlie Yoshikawa Patient consented and agreed to enrollment in 30 day program  Medications Reviewed Today: Medications Reviewed Today     Reviewed by Audrie Gallus, RN (Registered Nurse) on 05/24/23 at 1434  Med List Status: <None>   Medication Order Taking? Sig Documenting Provider Last Dose Status Informant  acetaminophen (TYLENOL) 650 MG CR tablet [id number] Yes Take 1,300 mg by mouth daily as needed for pain. [provider] Taking Active Self  Albuterol-Budesonide (AIRSUPRA) 90-80 MCG/ACT Sandrea Matte [id number] Yes Inhale 2 puffs into the lungs every 6 (six) hours as needed (shortness of breath/ wheeze). Raliegh Ip, DO Taking Active Self           Med Note Arby Barrette   Wed May 19, 2023  1:46 PM) No fill history.  Patient states rescue albuterol inhaler - has not used in a long time.  aspirin 325 MG tablet [id number] Yes Take 3 tablets (975 mg total) by mouth 3 (three) times daily for 12 days, THEN 2 tablets (650 mg total) 3 (three) times daily for 7 days, THEN 1 tablet (325 mg total) 3 (three) times daily for 7 days. Maurilio Lovely D, DO Taking Active Self           Med Note Arby Barrette   Wed May 19, 2023  1:45 PM) Currently taking 975mg  TID  atorvastatin (LIPITOR) 20 MG tablet [id number]  Take 1 tablet (20 mg total) by mouth daily. Little Ishikawa, MD  Expired 05/19/23 2359 Self  colchicine 0.6 MG tablet [id number] Yes Take 1 tablet (0.6 mg total) by mouth daily. Sherryll Burger, Pratik D, DO Taking Active Self  fluticasone (FLONASE) 50 MCG/ACT nasal spray [id number] Yes Place 1 spray into both nostrils daily. [provider] Taking Active Self  furosemide (LASIX) 20 MG tablet [id number] Yes Take 1 tablet (20 mg) daily as needed if you gain more than 3 pounds in 1 day or 5 pounds in 1 week. Robbie Lis M, PA-C Taking Active Self  levothyroxine (SYNTHROID) 100 MCG tablet [id number] Yes Take 1 tablet (100 mcg total) by mouth daily. [name], MD Taking Active Self  levothyroxine (SYNTHROID) 25 MCG tablet [id number] No Take 1 tablet (25 mcg total) by mouth daily before breakfast. Take on an empty stomach with water only.  Wait to consume other foods/ drinks/ meds for 30 minutes.  Patient not taking: Reported on 05/19/2023   Raliegh Ip, DO Not Taking Active Self  losartan (COZAAR) 25 MG tablet [id number]  Take  1 tablet (25 mg total) by mouth daily. Lennette Bihari, MD  Expired 05/19/23 2359 Self  metoprolol succinate (TOPROL-XL) 25 MG 24 hr tablet [id number] Yes Take 1 tablet (25 mg total) by mouth daily. Raliegh Ip, DO Taking Active Self  Multiple Vitamin (MULTIVITAMIN) capsule [id number] Yes Take 1 capsule by mouth daily. [provider] Taking Active Self  polyvinyl alcohol  (LIQUIFILM TEARS) 1.4 % ophthalmic solution [id number] Yes Place 1 drop into both eyes as needed for dry eyes. Saundra Shelling, MD Taking Active Self           Med Note Christena Flake Feb 18, 2023  4:30 PM)    spironolactone (ALDACTONE) 25 MG tablet [id number] Yes Take 12.5 mg by mouth daily. [provider] Taking Active Self            Home Care and Equipment/Supplies: Were Home Health Services Ordered?: No Any new equipment or medical supplies ordered?: No  Functional Questionnaire: Do you need assistance with bathing/showering or dressing?: No Do you need assistance with meal preparation?: No Do you need assistance with eating?: No Do you have difficulty maintaining continence: No Do you need assistance with getting out of bed/getting out of a chair/moving?: No Do you have difficulty managing or taking your medications?: No  Follow up appointments reviewed: PCP Follow-up appointment confirmed?: Yes Date of PCP follow-up appointment?: 06/07/23 Follow-up Provider: Richardson Landry   @ 1015 am Specialist Hospital Follow-up appointment confirmed?: Yes Date of Specialist follow-up appointment?: 05/31/23 Follow-Up Specialty Provider:: York Pellant- defibrillator check  @ 1215 pm Do you need transportation to your follow-up appointment?: No (spouse assists if needed) Do you understand care options if your condition(s) worsen?: Yes-patient verbalized understanding  SDOH Interventions Today    Flowsheet Row Most Recent Value  SDOH Interventions   Food Insecurity Interventions Intervention Not Indicated  Housing Interventions Intervention Not Indicated  Transportation Interventions Intervention Not Indicated  Utilities Interventions Intervention Not Indicated       Irving Shows [facility], BSN RN Care Manager/ Transition of Care [place]/ [facility] Population Health [phone]

## 2023-05-29 DIAGNOSIS — I517 Cardiomegaly: Secondary | ICD-10-CM | POA: Diagnosis not present

## 2023-05-29 DIAGNOSIS — Z96612 Presence of left artificial shoulder joint: Secondary | ICD-10-CM | POA: Diagnosis not present

## 2023-05-29 DIAGNOSIS — Z96611 Presence of right artificial shoulder joint: Secondary | ICD-10-CM | POA: Diagnosis not present

## 2023-05-29 DIAGNOSIS — R059 Cough, unspecified: Secondary | ICD-10-CM | POA: Diagnosis not present

## 2023-05-29 DIAGNOSIS — R051 Acute cough: Secondary | ICD-10-CM | POA: Diagnosis not present

## 2023-05-29 DIAGNOSIS — J101 Influenza due to other identified influenza virus with other respiratory manifestations: Secondary | ICD-10-CM | POA: Diagnosis not present

## 2023-05-31 ENCOUNTER — Ambulatory Visit: Payer: Medicare HMO | Admitting: Cardiovascular Disease

## 2023-05-31 ENCOUNTER — Encounter: Payer: Self-pay | Admitting: Internal Medicine

## 2023-05-31 ENCOUNTER — Telehealth: Payer: Self-pay

## 2023-05-31 NOTE — Telephone Encounter (Signed)
Copied from CRM [id number]. Topic: Clinical - Medication Question >> May 31, 2023 10:07 AM Alvino Blood C wrote: Reason for CRM: Pt's spouse is wanting to know why the patient was not prescribed Theraflu.

## 2023-05-31 NOTE — Telephone Encounter (Signed)
Patients daughter sent a mychart message about pts thyroid therapy. Spoke with the patient and informed him that his daughter sent the message to the wrong provider by mistake. Patient stated that he has been in contact with Rudi Heap, NP who put the referral in and has appt. Asked patient if it was okay to send a mychart message back to his daughter and he stated that was fine. Informed pt of upcoming appts.  Pt verbalized understanding.

## 2023-05-31 NOTE — Telephone Encounter (Signed)
Called and spoke with patient. Per urgent care note Tamiflu and tessalon pearles were sent to pharmacy. Advised patient of this and they stated that they would go and pick the medication up.

## 2023-06-03 ENCOUNTER — Other Ambulatory Visit: Payer: Self-pay | Admitting: *Deleted

## 2023-06-03 ENCOUNTER — Ambulatory Visit (INDEPENDENT_AMBULATORY_CARE_PROVIDER_SITE_OTHER): Payer: Medicare Other

## 2023-06-03 DIAGNOSIS — I5042 Chronic combined systolic (congestive) and diastolic (congestive) heart failure: Secondary | ICD-10-CM | POA: Diagnosis not present

## 2023-06-03 LAB — CUP PACEART REMOTE DEVICE CHECK
Battery Remaining Longevity: 97 mo
Battery Remaining Percentage: 95.5 %
Battery Voltage: 3.04 V
Brady Statistic AP VP Percent: 5.4 %
Brady Statistic AP VS Percent: 1.9 %
Brady Statistic AS VP Percent: 83 %
Brady Statistic AS VS Percent: 1.7 %
Brady Statistic RA Percent Paced: 6.3 %
Date Time Interrogation Session: 20250101210523
HighPow Impedance: 73 Ohm
Implantable Lead Connection Status: 753985
Implantable Lead Connection Status: 753985
Implantable Lead Connection Status: 753985
Implantable Lead Implant Date: 20240930
Implantable Lead Implant Date: 20240930
Implantable Lead Implant Date: 20240930
Implantable Lead Location: 753858
Implantable Lead Location: 753859
Implantable Lead Location: 753860
Implantable Pulse Generator Implant Date: 20240930
Lead Channel Impedance Value: 390 Ohm
Lead Channel Impedance Value: 490 Ohm
Lead Channel Impedance Value: 880 Ohm
Lead Channel Pacing Threshold Amplitude: 0.5 V
Lead Channel Pacing Threshold Amplitude: 0.875 V
Lead Channel Pacing Threshold Amplitude: 1.375 V
Lead Channel Pacing Threshold Pulse Width: 0.4 ms
Lead Channel Pacing Threshold Pulse Width: 0.4 ms
Lead Channel Pacing Threshold Pulse Width: 0.5 ms
Lead Channel Sensing Intrinsic Amplitude: 1 mV
Lead Channel Sensing Intrinsic Amplitude: 11.8 mV
Lead Channel Setting Pacing Amplitude: 1.5 V
Lead Channel Setting Pacing Amplitude: 1.875
Lead Channel Setting Pacing Amplitude: 1.875
Lead Channel Setting Pacing Pulse Width: 0.4 ms
Lead Channel Setting Pacing Pulse Width: 0.4 ms
Lead Channel Setting Sensing Sensitivity: 0.5 mV
Pulse Gen Serial Number: 211030215
Zone Setting Status: 755011

## 2023-06-03 NOTE — Patient Instructions (Signed)
 Visit Information  Thank you for taking time to visit with me today. Please don't hesitate to contact me if I can be of assistance to you before our next scheduled telephone appointment.  Following are the goals we discussed today:   Goals Addressed             This Visit's Progress    Transition of Care/ pt will have no readmissions within 30 days       Current Barriers:  Knowledge Deficits related to plan of care for management of CHF  Patient reports he lives with spouse, is working on getting stronger, has walker on hand but has not needed, has all medications and taking as prescribed Patient reports he is now weighing daily with recent weight of 203 pounds Patient reports he went to [facility] urgent care on 12/28 and diagnosed with upper respiratory infection and feeling better now Patient to start radiation treatment for thyroid , will go in on 06/06/22 for an injection, 06/07/22 another injection and 06/08/22 to take po medication  RNCM Clinical Goal(s):  Patient will verbalize understanding of plan for management of CHF as evidenced by pt report, review of EMR  through collaboration with RN Care manager, provider, and care team.   Interventions: Evaluation of current treatment plan related to  self management and patient's adherence to plan as established by provider   Heart Failure Interventions:  (Status:  New goal. and Goal on track:  Yes.) Short Term Goal Discussed importance of daily weight and advised patient to weigh and record daily Reviewed role of diuretics in prevention of fluid overload and management of heart failure; Discussed the importance of keeping all appointments with provider Reinforced heart failure action plan Reviewed upcoming scheduled appointments  Patient Goals/Self-Care Activities: Participate in Transition of Care Program/Attend [facility] scheduled calls Notify RN Care Manager of TOC call rescheduling needs Take all medications as prescribed Attend all  scheduled provider appointments Call pharmacy for medication refills 3-7 days in advance of running out of medications Call provider office for new concerns or questions  call office if I gain more than 2 pounds in one day or 5 pounds in one week do ankle pumps when sitting keep legs up while sitting track weight in diary use salt in moderation watch for swelling in feet, ankles and legs every day weigh myself daily begin a heart failure diary develop a rescue plan follow rescue plan if symptoms flare-up eat more whole grains, fruits and vegetables, lean meats and healthy fats track symptoms and what helps feel better or worse Please continue weighing daily  Follow Up Plan:  Telephone follow up appointment with care management team member scheduled for:  06/10/23 @ 10 am           Our next appointment is by telephone on 06/10/23 at 10 am  Please call the care guide team at [phone] if you need to cancel or reschedule your appointment.   If you are experiencing a Mental Health or Behavioral Health Crisis or need someone to talk to, please call the Suicide and Crisis Lifeline: 988 call the [place]  National Suicide Prevention Lifeline: 5340227659 or TTY: (910)562-0161 TTY (803)864-4732) to talk to a trained counselor call 1-800-273-TALK (toll free, 24 hour hotline) go to [facility] [address] [place] [phone]) call the [facility] Crisis Line: [phone] call 911   Patient verbalizes understanding of instructions and care plan provided today and agrees to view in MyChart. Active MyChart status and  patient understanding of how to access instructions and care plan via MyChart confirmed with patient.     Mliss Creed [facility], BSN RN Care Manager/ Transition of Care [place]/ [facility] [phone]

## 2023-06-03 NOTE — Patient Outreach (Signed)
 Care Management  Transitions of Care Program Transitions of Care Post-discharge week 2   06/03/2023 Name: Billy Rasmussen MRN: [id number] DOB: Jun 03, 1945  Subjective: Billy Rasmussen is a 78 y.o. year old male who is a primary care patient of [name], DO. The Care Management team Engaged with patient Engaged with patient by telephone to assess and address transitions of care needs.   Consent to Services:  Patient previously consented to enrollment in TOC 30 day program  Assessment: Patient reports he is feeling stronger although diagnosed with upper respiratory infection at urgent care on 05/29/23, has all medications and taking as prescribed, is now weighing daily.         SDOH Interventions    Flowsheet Row Telephone from 05/24/2023 in [place] [facility] DEPARTMENT Office Visit from 05/17/2023 in [facility] [place] Office Visit from 11/05/2022 in [place] [facility] [place] [facility] Telephone from 10/15/2022 in [facility] Community Care Coordination ED to Hosp-Admission (Discharged) from 10/06/2022 in [place] [facility] Clinical Support from 08/14/2022 in [facility] [place] [facility]  SDOH Interventions        Food Insecurity Interventions Intervention Not Indicated -- -- Intervention Not Indicated -- Intervention Not Indicated  Housing Interventions Intervention Not Indicated -- -- Intervention Not Indicated Intervention Not Indicated Intervention Not Indicated  Transportation Interventions Intervention Not Indicated -- -- Intervention Not Indicated -- Intervention Not Indicated  Utilities Interventions Intervention Not Indicated -- -- Intervention Not Indicated -- Intervention Not Indicated  Alcohol  Usage Interventions -- -- -- -- -- Intervention Not Indicated (Score <7)  Depression Interventions/Treatment  -- PHQ2-9 Score <4 Follow-up Not Indicated PHQ2-9 Score <4 Follow-up Not Indicated  -- -- --  Financial Strain Interventions -- -- -- Intervention Not Indicated Intervention Not Indicated Intervention Not Indicated  Physical Activity Interventions -- -- -- -- -- Patient Refused, Other (Comments)  Stress Interventions -- -- -- -- -- Intervention Not Indicated  Social Connections Interventions -- -- -- -- -- Intervention Not Indicated        Goals Addressed             This Visit's Progress    Transition of Care/ pt will have no readmissions within 30 days       Current Barriers:  Knowledge Deficits related to plan of care for management of CHF  Patient reports he lives with spouse, is working on getting stronger, has walker on hand but has not needed, has all medications and taking as prescribed Patient reports he is now weighing daily with recent weight of 203 pounds Patient reports he went to [facility] urgent care on 12/28 and diagnosed with upper respiratory infection and feeling better now Patient to start radiation treatment for thyroid , will go in on 06/06/22 for an injection, 06/07/22 another injection and 06/08/22 to take po medication  RNCM Clinical Goal(s):  Patient will verbalize understanding of plan for management of CHF as evidenced by pt report, review of EMR  through collaboration with RN Care manager, provider, and care team.   Interventions: Evaluation of current treatment plan related to  self management and patient's adherence to plan as established by provider   Heart Failure Interventions:  (Status:  New goal. and Goal on track:  Yes.) Short Term Goal Discussed importance of daily weight and advised patient to weigh and record daily Reviewed role of diuretics in prevention of fluid overload and management of heart failure; Discussed the importance of keeping  all appointments with provider Reinforced heart failure action plan Reviewed upcoming scheduled appointments  Patient Goals/Self-Care Activities: Participate in Transition of Care Program/Attend  [facility] scheduled calls Notify RN Care Manager of TOC call rescheduling needs Take all medications as prescribed Attend all scheduled provider appointments Call pharmacy for medication refills 3-7 days in advance of running out of medications Call provider office for new concerns or questions  call office if I gain more than 2 pounds in one day or 5 pounds in one week do ankle pumps when sitting keep legs up while sitting track weight in diary use salt in moderation watch for swelling in feet, ankles and legs every day weigh myself daily begin a heart failure diary develop a rescue plan follow rescue plan if symptoms flare-up eat more whole grains, fruits and vegetables, lean meats and healthy fats track symptoms and what helps feel better or worse Please continue weighing daily  Follow Up Plan:  Telephone follow up appointment with care management team member scheduled for:  06/10/23 @ 10 am          Plan: Telephone follow up appointment with care management team member scheduled for: 06/10/23 @ 10 am  Mliss Creed [facility], BSN RN Care Manager/ Transition of Care [place]/ [facility] Population Health [phone]

## 2023-06-04 ENCOUNTER — Encounter: Payer: Self-pay | Admitting: Oncology

## 2023-06-04 ENCOUNTER — Telehealth: Payer: Medicare HMO | Admitting: Adult Health

## 2023-06-07 ENCOUNTER — Telehealth: Payer: Self-pay | Admitting: Family Medicine

## 2023-06-07 ENCOUNTER — Inpatient Hospital Stay: Payer: Medicare HMO | Admitting: Nurse Practitioner

## 2023-06-07 ENCOUNTER — Ambulatory Visit (HOSPITAL_COMMUNITY)
Admission: RE | Admit: 2023-06-07 | Discharge: 2023-06-07 | Disposition: A | Discharge status: Home / Self Care | Payer: Medicare Other | Source: Ambulatory Visit | Attending: Nurse Practitioner | Admitting: Nurse Practitioner

## 2023-06-07 DIAGNOSIS — C73 Malignant neoplasm of thyroid gland: Secondary | ICD-10-CM | POA: Diagnosis present

## 2023-06-07 MED ORDER — STERILE WATER FOR INJECTION IJ SOLN
INTRAMUSCULAR | Status: AC
  Filled 2023-06-07: qty 10

## 2023-06-07 MED ORDER — THYROTROPIN ALFA 0.9 MG IM SOLR
0.9000 mg | INTRAMUSCULAR | Status: AC
  Administered 2023-06-07: 0.9 mg via INTRAMUSCULAR

## 2023-06-07 NOTE — Telephone Encounter (Signed)
 Copied from CRM [id number]. Topic: Clinical - Medical Advice >> Jun 07, 2023 10:22 AM Billy Rasmussen wrote: Reason for CRM: Patient is having a lot of questions, starts taking a radioactive medication Wednesday. Says he has received different answers from different providers and wants to speak with Dr. Jolinda or her nurse to get a proper answer. Would like a call back at [phone]

## 2023-06-08 ENCOUNTER — Encounter (HOSPITAL_COMMUNITY)
Admission: RE | Admit: 2023-06-08 | Discharge: 2023-06-08 | Disposition: A | Discharge status: Home / Self Care | Payer: Medicare Other | Source: Ambulatory Visit | Attending: Nurse Practitioner | Admitting: Nurse Practitioner

## 2023-06-08 ENCOUNTER — Telehealth: Payer: Self-pay | Admitting: Family Medicine

## 2023-06-08 DIAGNOSIS — R110 Nausea: Secondary | ICD-10-CM

## 2023-06-08 DIAGNOSIS — C73 Malignant neoplasm of thyroid gland: Secondary | ICD-10-CM | POA: Diagnosis not present

## 2023-06-08 MED ORDER — ONDANSETRON 4 MG PO TBDP: 4.0000 mg | ORAL_TABLET | Freq: Three times a day (TID) | ORAL | 0 refills | Status: DC | PRN

## 2023-06-08 MED ORDER — THYROTROPIN ALFA 0.9 MG IM SOLR
0.9000 mg | INTRAMUSCULAR | Status: AC
  Administered 2023-06-08: 0.9 mg via INTRAMUSCULAR

## 2023-06-08 NOTE — Telephone Encounter (Signed)
 Sent. Did the endocrinology office get in touch with him re: radiation tablet?   Meds ordered this encounter  Medications   ondansetron  (ZOFRAN -ODT) 4 MG disintegrating tablet    Sig: Take 1 tablet (4 mg total) by mouth every 8 (eight) hours as needed for nausea or vomiting.    Dispense:  20 tablet    Refill:  0

## 2023-06-08 NOTE — Telephone Encounter (Signed)
 I spoke to Arnold at the [facility] location.  She is going to give the patient a call to discuss precautions with regards to the radioactive therapy he will be receiving tomorrow.

## 2023-06-08 NOTE — Addendum Note (Signed)
 Addended by: Raliegh Ip on: 06/08/2023 02:12 PM   Modules accepted: Orders

## 2023-06-08 NOTE — Telephone Encounter (Signed)
 Copied from CRM [id number]. Topic: Clinical - Medication Question >> Jun 08, 2023  1:24 PM Susanna ORN wrote: Reason for CRM: Patient states he spoke with Dr. Jolinda about a prescription for nausea. He states he told her that if she writes it, he will pick it up today. He states he's needing a prescription for Zofran .

## 2023-06-08 NOTE — Telephone Encounter (Signed)
 Patient aware and verbalized understanding. He states they did get in tough with them.

## 2023-06-09 ENCOUNTER — Encounter (HOSPITAL_COMMUNITY)
Admission: RE | Admit: 2023-06-09 | Discharge: 2023-06-09 | Disposition: A | Discharge status: Home / Self Care | Payer: Medicare Other | Source: Ambulatory Visit | Attending: Nurse Practitioner | Admitting: Nurse Practitioner

## 2023-06-10 ENCOUNTER — Other Ambulatory Visit: Payer: Self-pay | Admitting: *Deleted

## 2023-06-10 ENCOUNTER — Encounter: Payer: Self-pay | Admitting: *Deleted

## 2023-06-10 NOTE — Patient Instructions (Signed)
 Visit Information  Thank you for taking time to visit with me today. Please don't hesitate to contact me if I can be of assistance to you before our next scheduled telephone appointment.  Following are the goals we discussed today:   Goals Addressed             This Visit's Progress    Transition of Care/ pt will have no readmissions within 30 days       Current Barriers:  Knowledge Deficits related to plan of care for management of CHF  Patient reports he lives with spouse, is working on getting stronger, has walker on hand but has not needed, has all medications and taking as prescribed Patient reports he is now weighing daily with recent weight of 205 pounds Patient reports he went to [facility] urgent care on 12/28 and diagnosed with upper respiratory infection and feeling better now Patient to start radiation treatment for thyroid , will go in on 06/06/22 for an injection, 06/07/22 another injection and 06/08/22 to take po medication, pt states he overall tolerated the treatments well other than having night sweats which have now resolved.  RNCM Clinical Goal(s):  Patient will verbalize understanding of plan for management of CHF as evidenced by pt report, review of EMR  through collaboration with RN Care manager, provider, and care team.   Interventions: Evaluation of current treatment plan related to  self management and patient's adherence to plan as established by provider   Heart Failure Interventions:  (Status:  New goal. and Goal on track:  Yes.) Short Term Goal Discussed importance of daily weight and advised patient to weigh and record daily Reviewed role of diuretics in prevention of fluid overload and management of heart failure; Discussed the importance of keeping all appointments with provider Reviewed heart failure action plan, importance of calling doctor early on for change in health status/ symptoms Reviewed upcoming scheduled appointments  Patient Goals/Self-Care  Activities: Participate in Transition of Care Program/Attend [facility] scheduled calls Notify RN Care Manager of TOC call rescheduling needs Take all medications as prescribed Attend all scheduled provider appointments Call pharmacy for medication refills 3-7 days in advance of running out of medications Call provider office for new concerns or questions  call office if I gain more than 2 pounds in one day or 5 pounds in one week do ankle pumps when sitting keep legs up while sitting track weight in diary use salt in moderation watch for swelling in feet, ankles and legs every day weigh myself daily begin a heart failure diary develop a rescue plan follow rescue plan if symptoms flare-up eat more whole grains, fruits and vegetables, lean meats and healthy fats track symptoms and what helps feel better or worse Please continue weighing daily- keep up the good work!  Follow Up Plan:  Telephone follow up appointment with care management team member scheduled for:  06/17/23 @ 1 pm           Our next appointment is by telephone on 06/17/23 at 1 pm  Please call the care guide team at [phone] if you need to cancel or reschedule your appointment.   If you are experiencing a Mental Health or Behavioral Health Crisis or need someone to talk to, please call the Suicide and Crisis Lifeline: 988 call the [place]  National Suicide Prevention Lifeline: 469-566-3394 or TTY: 385-733-6495 TTY 515-651-9739) to talk to a trained counselor call 1-800-273-TALK (toll free, 24 hour hotline) go to [facility] Care [address], [place] [phone]) call the [facility] Line: [phone] call 911   Patient verbalizes understanding of instructions and care plan provided today and agrees to view in MyChart. Active MyChart status and patient understanding of how to access instructions and care plan via MyChart confirmed with patient.     Mliss Creed [facility],  BSN RN Care Manager/ Transition of Care [place]/ [facility] [phone]

## 2023-06-10 NOTE — Patient Outreach (Signed)
 Care Management  Transitions of Care Program Transitions of Care Post-discharge week 3   06/10/2023 Name: Billy Rasmussen MRN: [id number] DOB: 04-20-1946  Subjective: GUNTHER ZAWADZKI is a 78 y.o. year old male who is a primary care patient of [name], DO. The Care Management team Engaged with patient Engaged with patient by telephone to assess and address transitions of care needs.   Consent to Services:  Patient was given information about care management services, agreed to services, and gave verbal consent to participate.   Assessment: Pt reports he started radioactive therapy for thyroid  and overall tolerated well, continues to weigh daily, has all medications and taking as prescribed, no new concerns reported today.         SDOH Interventions    Flowsheet Row Telephone from 05/24/2023 in [place] POPULATION HEALTH DEPARTMENT Office Visit from 05/17/2023 in [facility] [facility] Office Visit from 11/05/2022 in [place] [facility] [facility] Telephone from 10/15/2022 in [facility] Community Care Coordination ED to Hosp-Admission (Discharged) from 10/06/2022 in [place] [facility] Clinical Support from 08/14/2022 in [facility] [place] [facility]  SDOH Interventions        Food Insecurity Interventions Intervention Not Indicated -- -- Intervention Not Indicated -- Intervention Not Indicated  Housing Interventions Intervention Not Indicated -- -- Intervention Not Indicated Intervention Not Indicated Intervention Not Indicated  Transportation Interventions Intervention Not Indicated -- -- Intervention Not Indicated -- Intervention Not Indicated  Utilities Interventions Intervention Not Indicated -- -- Intervention Not Indicated -- Intervention Not Indicated  Alcohol  Usage Interventions -- -- -- -- -- Intervention Not Indicated (Score <7)  Depression Interventions/Treatment  -- PHQ2-9 Score <4  Follow-up Not Indicated PHQ2-9 Score <4 Follow-up Not Indicated -- -- --  Financial Strain Interventions -- -- -- Intervention Not Indicated Intervention Not Indicated Intervention Not Indicated  Physical Activity Interventions -- -- -- -- -- Patient Refused, Other (Comments)  Stress Interventions -- -- -- -- -- Intervention Not Indicated  Social Connections Interventions -- -- -- -- -- Intervention Not Indicated        Goals Addressed             This Visit's Progress    Transition of Care/ pt will have no readmissions within 30 days       Current Barriers:  Knowledge Deficits related to plan of care for management of CHF  Patient reports he lives with spouse, is working on getting stronger, has walker on hand but has not needed, has all medications and taking as prescribed Patient reports he is now weighing daily with recent weight of 205 pounds Patient reports he went to [facility] urgent care on 12/28 and diagnosed with upper respiratory infection and feeling better now Patient to start radiation treatment for thyroid , will go in on 06/06/22 for an injection, 06/07/22 another injection and 06/08/22 to take po medication, pt states he overall tolerated the treatments well other than having night sweats which have now resolved.  RNCM Clinical Goal(s):  Patient will verbalize understanding of plan for management of CHF as evidenced by pt report, review of EMR  through collaboration with RN Care manager, provider, and care team.   Interventions: Evaluation of current treatment plan related to  self management and patient's adherence to plan as established by provider   Heart Failure Interventions:  (Status:  New goal. and Goal on track:  Yes.) Short Term Goal Discussed importance of daily weight and advised  patient to weigh and record daily Reviewed role of diuretics in prevention of fluid overload and management of heart failure; Discussed the importance of keeping all appointments with  provider Reviewed heart failure action plan, importance of calling doctor early on for change in health status/ symptoms Reviewed upcoming scheduled appointments  Patient Goals/Self-Care Activities: Participate in Transition of Care Program/Attend [facility] scheduled calls Notify RN Care Manager of TOC call rescheduling needs Take all medications as prescribed Attend all scheduled provider appointments Call pharmacy for medication refills 3-7 days in advance of running out of medications Call provider office for new concerns or questions  call office if I gain more than 2 pounds in one day or 5 pounds in one week do ankle pumps when sitting keep legs up while sitting track weight in diary use salt in moderation watch for swelling in feet, ankles and legs every day weigh myself daily begin a heart failure diary develop a rescue plan follow rescue plan if symptoms flare-up eat more whole grains, fruits and vegetables, lean meats and healthy fats track symptoms and what helps feel better or worse Please continue weighing daily- keep up the good work!  Follow Up Plan:  Telephone follow up appointment with care management team member scheduled for:  06/17/23 @ 1 pm          Plan: Telephone follow up appointment with care management team member scheduled for:  06/17/23 @ 1 pm  Mliss Creed [facility], BSN RN Care Manager/ Transition of Care / [facility] Population Health [phone]

## 2023-06-17 ENCOUNTER — Encounter: Payer: Self-pay | Admitting: *Deleted

## 2023-06-17 ENCOUNTER — Encounter: Payer: Self-pay | Admitting: Oncology

## 2023-06-17 ENCOUNTER — Other Ambulatory Visit: Payer: Self-pay | Admitting: *Deleted

## 2023-06-17 NOTE — Patient Outreach (Signed)
Care Management  Transitions of Care Program Transitions of Care Post-discharge week 4   06/17/2023 Name: Billy Rasmussen MRN: [id number] DOB: 19-Mar-1946  Subjective: Billy Rasmussen is a 78 y.o. year old male who is a primary care patient of Raliegh Ip, DO. The Care Management team Engaged with patient Engaged with patient by telephone to assess and address transitions of care needs.   Consent to Services:  Patient was given information about care management services, agreed to services, and gave verbal consent to participate.   Assessment: Patient is doing well, getting stronger, eating better, continues weighing daily, has all meds and taking as prescribed         SDOH Interventions    Flowsheet Row Telephone from 05/24/2023 in [place] [facility] DEPARTMENT Office Visit from 05/17/2023 in [facility] Office Visit from 11/05/2022 in [facility] Telephone from 10/15/2022 in [facility] Community Care Coordination ED to Hosp-Admission (Discharged) from 10/06/2022 in [facility] [facility] Clinical Support from 08/14/2022 in [facility]  SDOH Interventions        Food Insecurity Interventions Intervention Not Indicated -- -- Intervention Not Indicated -- Intervention Not Indicated  Housing Interventions Intervention Not Indicated -- -- Intervention Not Indicated Intervention Not Indicated Intervention Not Indicated  Transportation Interventions Intervention Not Indicated -- -- Intervention Not Indicated -- Intervention Not Indicated  Utilities Interventions Intervention Not Indicated -- -- Intervention Not Indicated -- Intervention Not Indicated  Alcohol Usage Interventions -- -- -- -- -- Intervention Not Indicated (Score <7)  Depression Interventions/Treatment  -- PHQ2-9 Score <4 Follow-up Not Indicated PHQ2-9 Score <4 Follow-up Not Indicated -- -- --   Financial Strain Interventions -- -- -- Intervention Not Indicated Intervention Not Indicated Intervention Not Indicated  Physical Activity Interventions -- -- -- -- -- Patient Refused, Other (Comments)  Stress Interventions -- -- -- -- -- Intervention Not Indicated  Social Connections Interventions -- -- -- -- -- Intervention Not Indicated        Goals Addressed             This Visit's Progress    Transition of Care/ pt will have no readmissions within 30 days       Current Barriers:  Knowledge Deficits related to plan of care for management of CHF  Patient reports he lives with spouse, is working on getting stronger, has walker on hand but has not needed, has all medications and taking as prescribed Patient reports he is now weighing daily with recent weight of 201 pounds Patient reports he went to [facility] urgent care on 12/28 and diagnosed with upper respiratory infection and feeling better now Patient to start radiation treatment for thyroid, on 06/06/22 had an injection, 06/07/22 another injection and 06/08/22 took po medication, pt states he overall tolerated the treatments well other than having night sweats which have now resolved. Patient reports he is eating better as food had not been tasting good but this is improving  RNCM Clinical Goal(s):  Patient will verbalize understanding of plan for management of CHF as evidenced by pt report, review of EMR  through collaboration with RN Care manager, provider, and care team.   Interventions: Evaluation of current treatment plan related to  self management and patient's adherence to plan as established by provider  Heart Failure Interventions:  (Status:  New goal. and Goal on track:  Yes.) Short Term Goal Discussed importance of daily weight  and advised patient to weigh and record daily Reviewed role of diuretics in prevention of fluid overload and management of heart failure; Discussed the importance of keeping all appointments with  provider Reinforced heart failure action plan, importance of calling doctor early on for change in health status/ symptoms Reviewed upcoming scheduled appointments  Patient Goals/Self-Care Activities: Participate in Transition of Care Program/Attend [facility] scheduled calls Notify RN Care Manager of TOC call rescheduling needs Take all medications as prescribed Attend all scheduled provider appointments Call pharmacy for medication refills 3-7 days in advance of running out of medications Call provider office for new concerns or questions  call office if I gain more than 2 pounds in one day or 5 pounds in one week do ankle pumps when sitting keep legs up while sitting track weight in diary use salt in moderation watch for swelling in feet, ankles and legs every day weigh myself daily begin a heart failure diary develop a rescue plan follow rescue plan if symptoms flare-up eat more whole grains, fruits and vegetables, lean meats and healthy fats track symptoms and what helps feel better or worse Please continue weighing daily- keep up the good work!  Follow Up Plan:  Telephone follow up appointment with care management team member scheduled for:  06/24/23 @ 1 pm          Plan: Telephone follow up appointment with care management team member scheduled for: 06/24/23 @ 1 pm  Irving Shows [facility], BSN RN Care Manager/ Transition of Care [place]/ [facility] Population Health [phone]

## 2023-06-17 NOTE — Patient Instructions (Signed)
Visit Information  Thank you for taking time to visit with me today. Please don't hesitate to contact me if I can be of assistance to you before our next scheduled telephone appointment.  Following are the goals we discussed today:   Goals Addressed             This Visit's Progress    Transition of Care/ pt will have no readmissions within 30 days       Current Barriers:  Knowledge Deficits related to plan of care for management of CHF  Patient reports he lives with spouse, is working on getting stronger, has walker on hand but has not needed, has all medications and taking as prescribed Patient reports he is now weighing daily with recent weight of 201 pounds Patient reports he went to [facility] urgent care on 12/28 and diagnosed with upper respiratory infection and feeling better now Patient to start radiation treatment for thyroid, on 06/06/22 had an injection, 06/07/22 another injection and 06/08/22 took po medication, pt states he overall tolerated the treatments well other than having night sweats which have now resolved. Patient reports he is eating better as food had not been tasting good but this is improving  RNCM Clinical Goal(s):  Patient will verbalize understanding of plan for management of CHF as evidenced by pt report, review of EMR  through collaboration with RN Care manager, provider, and care team.   Interventions: Evaluation of current treatment plan related to  self management and patient's adherence to plan as established by provider  Heart Failure Interventions:  (Status:  New goal. and Goal on track:  Yes.) Short Term Goal Discussed importance of daily weight and advised patient to weigh and record daily Reviewed role of diuretics in prevention of fluid overload and management of heart failure; Discussed the importance of keeping all appointments with provider Reinforced heart failure action plan, importance of calling doctor early on for change in health status/  symptoms Reviewed upcoming scheduled appointments  Patient Goals/Self-Care Activities: Participate in Transition of Care Program/Attend [facility] scheduled calls Notify RN Care Manager of TOC call rescheduling needs Take all medications as prescribed Attend all scheduled provider appointments Call pharmacy for medication refills 3-7 days in advance of running out of medications Call provider office for new concerns or questions  call office if I gain more than 2 pounds in one day or 5 pounds in one week do ankle pumps when sitting keep legs up while sitting track weight in diary use salt in moderation watch for swelling in feet, ankles and legs every day weigh myself daily begin a heart failure diary develop a rescue plan follow rescue plan if symptoms flare-up eat more whole grains, fruits and vegetables, lean meats and healthy fats track symptoms and what helps feel better or worse Please continue weighing daily- keep up the good work!  Follow Up Plan:  Telephone follow up appointment with care management team member scheduled for:  06/24/23 @ 1 pm           Our next appointment is by telephone on 06/24/23 at 1pm  Please call the care guide team at [phone] if you need to cancel or reschedule your appointment.   If you are experiencing a Mental Health or Behavioral Health Crisis or need someone to talk to, please call the Suicide and Crisis Lifeline: 988 call the [place] National Suicide Prevention Lifeline: 334-511-7346 or TTY: 229-713-6120 TTY 808-549-3261) to talk to a trained counselor call 1-800-273-TALK (toll free, 24 hour  hotline) go to [facility] [address], [place] [phone]) call the [facility] Crisis Line: [phone] call 911   Patient verbalizes understanding of instructions and care plan provided today and agrees to view in MyChart. Active MyChart status and patient understanding of how to access instructions  and care plan via MyChart confirmed with patient.     Irving Shows [facility], BSN RN Care Manager/ Transition of Care [place]/ [facility] [phone]

## 2023-06-18 ENCOUNTER — Encounter (HOSPITAL_COMMUNITY)
Admission: RE | Admit: 2023-06-18 | Discharge: 2023-06-18 | Disposition: A | Discharge status: Home / Self Care | Payer: Medicare Other | Source: Ambulatory Visit | Attending: Nurse Practitioner | Admitting: Nurse Practitioner

## 2023-06-18 DIAGNOSIS — C73 Malignant neoplasm of thyroid gland: Secondary | ICD-10-CM | POA: Insufficient documentation

## 2023-06-20 NOTE — Progress Notes (Unsigned)
Electrophysiology Office Note:   Date:  06/21/2023  ID:  Billy Rasmussen [birth year], MRN [id number]  Primary Cardiologist: Little Ishikawa, MD Primary Heart Failure: None Electrophysiologist: Maurice Small, MD       History of Present Illness:   Billy Rasmussen is a 78 y.o. male with h/o NICM (LVEF ~20%), LBBB s/p CRT-D, prior LV thrombus, HTN, CKD IIIa, thyroid cancer s/p thyroidectomy, bladder cancer, left nephrectomy, splenomegaly, thrombocytopenia & OSA (not on CPAP) seen today for routine electrophysiology followup.   ER visit on 05/29/23 for chest pain and shortness of breath after a recent admission for pericarditis / aortitis. He was treated with colchicine and ASA without improvement in the [facility] ER. There was conflicting radiology reads regarding his aorta.  He was transferred to [facility] for Rheumatology evaluation with concern for Giant Cell Arteritis, pericarditis, aortitis with elevated inflammatory markers. Device was functioning normally during admit. Hospital course notable for PVC's on tele, approx 10-20%. Ultimately it was felt he did not have aortitis & was treated for pericarditis. Infectious work up was negative.   Since last being seen in our clinic the patient reports his chest discomfort and shortness of breath have improved.  He has had significant diarrhea on colchicine.  He took it from the time of discharge to 06/20/22 am but notes he has to stop as he has had such bad diarrhea that is has had near incontinence.   He denies chest pain, palpitations, dyspnea, PND, orthopnea, nausea, vomiting, dizziness, syncope, edema, weight gain, or early satiety.   Review of systems complete and found to be negative unless listed in HPI.    EP Information / Studies Reviewed:    EKG is ordered today. Personal review as below.  EKG Interpretation Date/Time:  Monday June 21 2023 10:39:48 EST Ventricular Rate:  74 PR Interval:  210 QRS Duration:  172 QT  Interval:  470 QTC Calculation: 521 R Axis:   112  Text Interpretation: Atrial-sensed ventricular-paced rhythm with prolonged AV conduction Confirmed by Canary Brim ([id number]) on 06/21/2023 10:50:27 AM   ICD Interrogation-  reviewed in detail today,  See PACEART report.  Device History: Abbott BiV ICD implanted 03/01/23 for HFrEF. LBBB.  NO MRI due to metal in his eye. History of appropriate therapy: No History of AAD therapy: No   Studies:  R/LHC 09/2022 > mild diffuse CAD  ZIO 10/2022 > SR, <1% ectopy, few brief episodes of SVT  ECHO 01/25/2023 > LVEF <20%, G2 DD, markedly abnormal RV free wall strain  ECHO 05/2023 > LVEF 25-30%, G1DD cMRI 05/2023 > no LGE   Risk Assessment/Calculations:              Physical Exam:   VS:  BP 114/62   Pulse 74   Ht 5\' 6"  (1.676 m)   Wt 213 lb 9.6 oz (96.9 kg)   SpO2 96%   BMI 34.48 kg/m    Wt Readings from Last 3 Encounters:  06/21/23 213 lb 9.6 oz (96.9 kg)  05/17/23 218 lb (98.9 kg)  05/13/23 217 lb 9.5 oz (98.7 kg)     GEN: Well nourished, well developed in no acute distress NECK: No JVD; No carotid bruits CARDIAC: Regular rate and rhythm, no murmurs, rubs, gallops RESPIRATORY:  Clear to auscultation without rales, wheezing or rhonchi  ABDOMEN: Soft, non-tender, non-distended EXTREMITIES:  No edema; No deformity   ASSESSMENT AND PLAN:    Chronic Systolic Dysfunction, LBBB s/p Abbott CRT-D  Initial HF dx `  09/2022, LVEF was <20%, LBBB with QRS > .  -euvolemic today -Stable on an appropriate medical regimen -Normal ICD function -See Pace Art report -No changes today -95% BiV pacing with 3.2% PVC burden -EKG with QRS   Recent Pericarditis  -per Cardiology / previously seen in ER [facility] and hospitalized in [facility] -pt initially was planned for 3 months duration by [place] team but no further rec's from [facility] team.  -he currently is asymptomatic, pt has tried imodium > ok to hold for now, if recurrent chest discomfort pt  instructed to restart and discuss with Dr. Bjorn Pippin next week at visit  Disposition:   Follow up with Dr. Nelly Laurence in 6 months   Canary Brim, NP-C, AGACNP-BC [facility] - Electrophysiology  06/21/2023, 1:07 PM

## 2023-06-21 ENCOUNTER — Encounter: Payer: Self-pay | Admitting: Pulmonary Disease

## 2023-06-21 ENCOUNTER — Ambulatory Visit: Payer: Medicare Other | Attending: Pulmonary Disease | Admitting: Pulmonary Disease

## 2023-06-21 VITALS — BP 114/62 | HR 74 | Ht 66.0 in | Wt 213.6 lb

## 2023-06-21 DIAGNOSIS — I10 Essential (primary) hypertension: Secondary | ICD-10-CM

## 2023-06-21 DIAGNOSIS — Z950 Presence of cardiac pacemaker: Secondary | ICD-10-CM | POA: Diagnosis not present

## 2023-06-21 DIAGNOSIS — I5020 Unspecified systolic (congestive) heart failure: Secondary | ICD-10-CM

## 2023-06-21 LAB — CUP PACEART INCLINIC DEVICE CHECK
Battery Remaining Longevity: 96 mo
Brady Statistic RA Percent Paced: 9 %
Brady Statistic RV Percent Paced: 95 %
Date Time Interrogation Session: 20250120123456
HighPow Impedance: 68.625
Implantable Lead Connection Status: 753985
Implantable Lead Connection Status: 753985
Implantable Lead Connection Status: 753985
Implantable Lead Implant Date: 20240930
Implantable Lead Implant Date: 20240930
Implantable Lead Implant Date: 20240930
Implantable Lead Location: 753858
Implantable Lead Location: 753859
Implantable Lead Location: 753860
Implantable Pulse Generator Implant Date: 20240930
Lead Channel Impedance Value: 387.5 Ohm
Lead Channel Impedance Value: 450 Ohm
Lead Channel Impedance Value: 812.5 Ohm
Lead Channel Pacing Threshold Amplitude: 0.5 V
Lead Channel Pacing Threshold Amplitude: 0.75 V
Lead Channel Pacing Threshold Amplitude: 1.375 V
Lead Channel Pacing Threshold Pulse Width: 0.4 ms
Lead Channel Pacing Threshold Pulse Width: 0.4 ms
Lead Channel Pacing Threshold Pulse Width: 0.5 ms
Lead Channel Sensing Intrinsic Amplitude: 1 mV
Lead Channel Sensing Intrinsic Amplitude: 11.8 mV
Lead Channel Setting Pacing Amplitude: 1.5 V
Lead Channel Setting Pacing Amplitude: 1.75 V
Lead Channel Setting Pacing Amplitude: 1.875
Lead Channel Setting Pacing Pulse Width: 0.4 ms
Lead Channel Setting Pacing Pulse Width: 0.4 ms
Lead Channel Setting Sensing Sensitivity: 0.5 mV
Pulse Gen Serial Number: 211030215
Zone Setting Status: 755011

## 2023-06-21 NOTE — Patient Instructions (Addendum)
Medication Instructions:  Your physician recommends that you continue on your current medications as directed. Please refer to the Current Medication list given to you today.  *If you need a refill on your cardiac medications before your next appointment, please call your pharmacy*  Lab Work: None ordered.  If you have labs (blood work) drawn today and your tests are completely normal, you will receive your results only by: MyChart Message (if you have MyChart) OR A paper copy in the mail If you have any lab test that is abnormal or we need to change your treatment, we will call you to review the results.  Testing/Procedures: None ordered.  Follow-Up: At [facility], you and your health needs are our priority.  As part of our continuing mission to provide you with exceptional heart care, we have created designated Provider Care Teams.  These Care Teams include your primary Cardiologist (physician) and Advanced Practice Providers (APPs -  Physician Assistants and Nurse Practitioners) who all work together to provide you with the care you need, when you need it.   Your next appointment:   6 months  The format for your next appointment:   In Person  Provider:   Dr Nelly Laurence or one of the following Advanced Practice Providers on your designated Care Team:   Earnest Rosier, NP  Remote monitoring is used to monitor your Pacemaker/ ICD from home. This monitoring reduces the number of office visits required to check your device to one time per year. It allows [place] to keep an eye on the functioning of your device to ensure it is working properly.   Important Information About Sugar

## 2023-06-22 ENCOUNTER — Other Ambulatory Visit: Payer: Medicare Other

## 2023-06-24 ENCOUNTER — Telehealth: Payer: Self-pay

## 2023-06-24 ENCOUNTER — Other Ambulatory Visit: Payer: Self-pay

## 2023-06-24 NOTE — Patient Outreach (Signed)
Care Management  Transitions of Care Program Transitions of Care Post-discharge Week 5   06/24/2023 Name: Billy Rasmussen MRN: [id number] DOB: September 28, 1945  Subjective: Billy Rasmussen is a 78 y.o. year old male who is a primary care patient of Raliegh Ip, DO. The Care Management team Engaged with patient Engaged with patient by telephone to assess and address transitions of care needs.   Consent to Services:  Patient was given information about care management services, agreed to services, and gave verbal consent to participate.   Assessment: Successful outreach to patient this afternoon.  He notes he was running a low grade temperature yesterday, but this has resolved today.  He says he is eating but has no taste to any food.  He has lost weight and is down to 201.  Patient provided with contact information and will contact this writer if he has any questions/issues.  30 Day program completed.  SDOH Interventions    Flowsheet Row Telephone from 06/24/2023 in [place] POPULATION HEALTH DEPARTMENT Telephone from 05/24/2023 in [place] POPULATION HEALTH DEPARTMENT Office Visit from 05/17/2023 in [facility] Office Visit from 11/05/2022 in [facility] Telephone from 10/15/2022 in [facility] Community Care Coordination ED to Hosp-Admission (Discharged) from 10/06/2022 in [place]  Progressive Care  SDOH Interventions        Food Insecurity Interventions Intervention Not Indicated Intervention Not Indicated -- -- Intervention Not Indicated --  Housing Interventions Intervention Not Indicated Intervention Not Indicated -- -- Intervention Not Indicated Intervention Not Indicated  Transportation Interventions Intervention Not Indicated Intervention Not Indicated -- -- Intervention Not Indicated --  Utilities Interventions Intervention Not Indicated Intervention Not Indicated -- -- Intervention Not Indicated  --  Depression Interventions/Treatment  -- -- XBM8-4 Score <4 Follow-up Not Indicated PHQ2-9 Score <4 Follow-up Not Indicated -- --  Financial Strain Interventions -- -- -- -- Intervention Not Indicated Intervention Not Indicated        Goals Addressed             This Visit's Progress    COMPLETED: Transition of Care/ pt will have no readmissions within 30 days       Current Barriers:  Knowledge Deficits related to plan of care for management of CHF  Patient reports he lives with spouse, is working on getting stronger, has walker on hand but has not needed, has all medications and taking as prescribed Patient reports he is now weighing daily with recent weight of 201 pounds Patient reports he went to [facility] urgent care on 12/28 and diagnosed with upper respiratory infection and feeling better now Patient to start radiation treatment for thyroid, on 06/06/22 had an injection, 06/07/22 another injection and 06/08/22 took po medication, pt states he overall tolerated the treatments well other than having night sweats which have now resolved. Patient reports he is eating better as food had not been tasting good but this is improving  RNCM Clinical Goal(s):  Patient will verbalize understanding of plan for management of CHF as evidenced by pt report, review of EMR  through collaboration with RN Care manager, provider, and care team.   Interventions: Evaluation of current treatment plan related to  self management and patient's adherence to plan as established by provider  Heart Failure Interventions:  (Status:  Goal on track:  Yes. and Goal Met.) Short Term Goal Discussed importance of daily weight and advised patient to weigh and record daily Reviewed role of diuretics  in prevention of fluid overload and management of heart failure; Discussed the importance of keeping all appointments with provider Reinforced heart failure action plan, importance of calling doctor early on for change in health  status/ symptoms Reviewed upcoming scheduled appointments  Patient Goals/Self-Care Activities: Participate in Transition of Care Program/Attend [facility] scheduled calls Notify RN Care Manager of TOC call rescheduling needs Take all medications as prescribed Attend all scheduled provider appointments Call pharmacy for medication refills 3-7 days in advance of running out of medications Call provider office for new concerns or questions  call office if I gain more than 2 pounds in one day or 5 pounds in one week do ankle pumps when sitting keep legs up while sitting track weight in diary use salt in moderation watch for swelling in feet, ankles and legs every day weigh myself daily begin a heart failure diary develop a rescue plan follow rescue plan if symptoms flare-up eat more whole grains, fruits and vegetables, lean meats and healthy fats track symptoms and what helps feel better or worse Please continue weighing daily- keep up the good work!  Follow Up Plan:  The patient has been provided with contact information for the care management team and has been advised to call with any health related questions or concerns.          Plan: The patient has been provided with contact information for the care management team and has been advised to call with any health related questions or concerns.   Jodelle Gross RN, BSN, CCM [facility] RN Care Manager/Transition of Care Direct Dial: [phone]  Fax: [phone]

## 2023-06-24 NOTE — Patient Instructions (Signed)
Visit Information  Thank you for taking time to visit with me today. Please don't hesitate to contact me if I can be of assistance to you.   Following are the goals we discussed today:   Goals Addressed             This Visit's Progress    COMPLETED: Transition of Care/ pt will have no readmissions within 30 days       Current Barriers:  Knowledge Deficits related to plan of care for management of CHF  Patient reports he lives with spouse, is working on getting stronger, has walker on hand but has not needed, has all medications and taking as prescribed Patient reports he is now weighing daily with recent weight of 201 pounds Patient reports he went to [facility] urgent care on 12/28 and diagnosed with upper respiratory infection and feeling better now Patient to start radiation treatment for thyroid, on 06/06/22 had an injection, 06/07/22 another injection and 06/08/22 took po medication, pt states he overall tolerated the treatments well other than having night sweats which have now resolved. Patient reports he is eating better as food had not been tasting good but this is improving  RNCM Clinical Goal(s):  Patient will verbalize understanding of plan for management of CHF as evidenced by pt report, review of EMR  through collaboration with RN Care manager, provider, and care team.   Interventions: Evaluation of current treatment plan related to  self management and patient's adherence to plan as established by provider  Heart Failure Interventions:  (Status:  Goal on track:  Yes. and Goal Met.) Short Term Goal Discussed importance of daily weight and advised patient to weigh and record daily Reviewed role of diuretics in prevention of fluid overload and management of heart failure; Discussed the importance of keeping all appointments with provider Reinforced heart failure action plan, importance of calling doctor early on for change in health status/ symptoms Reviewed upcoming scheduled  appointments  Patient Goals/Self-Care Activities: Participate in Transition of Care Program/Attend [facility] scheduled calls Notify RN Care Manager of TOC call rescheduling needs Take all medications as prescribed Attend all scheduled provider appointments Call pharmacy for medication refills 3-7 days in advance of running out of medications Call provider office for new concerns or questions  call office if I gain more than 2 pounds in one day or 5 pounds in one week do ankle pumps when sitting keep legs up while sitting track weight in diary use salt in moderation watch for swelling in feet, ankles and legs every day weigh myself daily begin a heart failure diary develop a rescue plan follow rescue plan if symptoms flare-up eat more whole grains, fruits and vegetables, lean meats and healthy fats track symptoms and what helps feel better or worse Please continue weighing daily- keep up the good work!  Follow Up Plan:  The patient has been provided with contact information for the care management team and has been advised to call with any health related questions or concerns.          If you are experiencing a Mental Health or Behavioral Health Crisis or need someone to talk to, please call the Suicide and Crisis Lifeline: 988 call the [place] National Suicide Prevention Lifeline: 217-207-0261 or TTY: 217-430-1146 TTY (801)651-4054) to talk to a trained counselor   Patient verbalizes understanding of instructions and care plan provided today and agrees to view in MyChart. Active MyChart status and patient understanding of how to access instructions and care  plan via MyChart confirmed with patient.     The patient has been provided with contact information for the care management team and has been advised to call with any health related questions or concerns.   Jodelle Gross RN, BSN, CCM [facility] RN Care Manager/Transition of Care Direct Dial: [phone]   Fax: [phone]

## 2023-06-27 NOTE — Progress Notes (Unsigned)
Cardiology Office Note:    Date:  06/28/2023   ID:  Illias, Pantano 1946/04/25, MRN [id number]  PCP:  Raliegh Ip, DO  Cardiologist:  Little Ishikawa, MD  Electrophysiologist:  Maurice Small, MD   Referring MD: Raliegh Ip, DO   Chief Complaint  Patient presents with   Follow-up    3 months.   Shortness of Breath   Headache    History of Present Illness:    Billy Rasmussen is a 78 y.o. male with a hx of chronic combined systolic and diastolic heart failure, bladder cancer s/p left nephrectomy, cystoprostatectomy , fibromyalgia, hypertension, OSA not on CPAP, psoriasis who presents for follow-up.  He was initially seen as an ED follow-up on 10/06/2022.  He was seen in the ED on 09/27/2022 for shortness of breath.  Chest x-ray shows interstitial edema and mildly elevated BNP.  CTPA showed no PE but findings suggestive of pulmonary edema, as well as 4.5 cm ascending aortic aneurysm.  He was given Lasix with improvement and discharged on p.o. Lasix 20 mg daily with referral to cardiology.  Echocardiogram 04/2019 showed EF 55-60%, ascending aortic aneurysm measuring 45 mm, normal RV function, no significant valvular disease.  At initial clinic visit on 10/06/2022, he reported fever and was hypotensive.  He was sent to the ED.  He was admitted from 5/7 through 10/13/2022.  He was treated for UTI.  Echo 10/17/2022 showed EF less than 20%, mildly reduced RV systolic dysfunction, RVSP 44, severe left atrial enlargement, mild MR, dilated ascending aorta measuring 45 mm.  RHC/LHC on 10/09/2022 showed nonobstructive CAD (30% RCA stenosis, RA 6 RV 50/3, PA 55/26/37 PCWP 27 CI 3.85.  Had AKI during admission, diuresis was held and he was discharged on as needed Lasix.  Zio patch x 10 days 10/2022 showed 1 episode of NSVT lasting 8 beats, 10 episodes of SVT with longest lasting 19 beats.  Echocardiogram 12/2022 showed EF less than 20%, grade 2 diastolic dysfunction, elevated left  atrial pressure, mild RV dysfunction, mild to moderate MR, dilated ascending aorta measuring 44 mm.  He was referred to EP and underwent BiV ICD insertion with Dr. Nelly Laurence on 03/01/2023.  Cardiac MRI 05/08/2023 showed no LGE, unable to quantify LV/RV volumes and EF due to heart defect from patient's device, dilated ascending aorta measuring 44 mm.  Echocardiogram 05/14/2023 showed EF 25 to 30%, grade 1 diastolic dysfunction, mild RV dysfunction.  He was admitted 05/2023 with chest pain.  Suspected pericarditis, he was started on aspirin taper and colchicine.  Since last clinic visit, he reports he is doing better.  States that he is not having any more chest pain.  He had to stop colchicine due to diarrhea.  He reports his shortness of breath has improved, has been able to start exercising.  He has some lightheadedness but no syncope.  He denies any edema.  BP Readings from Last 3 Encounters:  06/28/23 118/68  06/21/23 114/62  05/19/23 (!) 135/50   Wt Readings from Last 3 Encounters:  06/28/23 213 lb (96.6 kg)  06/21/23 213 lb 9.6 oz (96.9 kg)  05/17/23 218 lb (98.9 kg)     Past Medical History:  Diagnosis Date   AICD (automatic cardioverter/defibrillator) present    [employer]   Arthritis    knees, shoulders, elbows   Bladder cancer [facility])    urologist-  dr Mena Goes   CHF (congestive heart failure) (HCC)    Diverticulosis of colon  Fibromyalgia    GERD (gastroesophageal reflux disease)    History of diverticulitis of colon    History of gastric ulcer    due to aleve   Hypertension    Lower urinary tract symptoms (LUTS)    OSA (obstructive sleep apnea)    Wears CPAP nightly   PONV (postoperative nausea and vomiting)    Psoriasis    Sepsis (HCC)    january 2021   Tinnitus    right ear more, has tranmitter in right ear removable at hs   Wears glasses    Wears partial dentures     Past Surgical History:  Procedure Laterality Date   APPENDECTOMY     BIV ICD INSERTION  CRT-D N/A 03/01/2023   Procedure: BIV ICD INSERTION CRT-D;  Surgeon: Maurice Small, MD;  [facility];  Service: Cardiovascular;  Laterality: N/A;   COLECTOMY W/ COLOSTOMY  1996   W/   APPENDECTOMY   COLONOSCOPY N/A 05/11/2018   Procedure: COLONOSCOPY;  Surgeon: Corbin Ade, MD;  [facility];  Service: Endoscopy;  Laterality: N/A;  9:30   COLOSTOMY TAKEDOWN  1996   CYSTOSCOPY WITH BIOPSY N/A 12/05/2013   Procedure: CYSTO BLADDER BIOPSY AND FULGERATION;  Surgeon: Jerilee Field, MD;  Location: [facility];  Service: Urology;  Laterality: N/A;   CYSTOSCOPY WITH BIOPSY Bilateral 11/13/2014   Procedure: CYSTOSCOPY WITH  BLADDER BIOPSY FULGERATION AND BILATERAL RETROGRADE PYELOGRAMS;  Surgeon: Jerilee Field, MD;  Location: [facility];  Service: Urology;  Laterality: Bilateral;   CYSTOSCOPY WITH FULGERATION N/A 01/18/2018   Procedure: CYSTOSCOPY WITH FULGERATION/ BLADDER BIOPSY;  Surgeon: Jerilee Field, MD;  Location: [facility];  Service: Urology;  Laterality: N/A;   CYSTOSCOPY WITH INJECTION N/A 11/09/2018   Procedure: CYSTOSCOPY WITH INJECTION OF INDOCYANINE GREEN DYE;  Surgeon: Sebastian Ache, MD;  Location: [facility];  Service: Urology;  Laterality: N/A;   CYSTOSCOPY WITH INSERTION OF UROLIFT N/A 01/18/2018   Procedure: CYSTOSCOPY WITH INSERTION OF UROLIFT;  Surgeon: Jerilee Field, MD;  Location: [facility];  Service: Urology;  Laterality: N/A;   CYSTOSCOPY/URETEROSCOPY/HOLMIUM LASER Left 02/17/2019   Procedure: URETEROSCOPY WITH BALLOON DILATION  AND NEPHROSTOGRAM;  Surgeon: Sebastian Ache, MD;  Location: [facility];  Service: Urology;  Laterality: Left;  1 HR   EXCISION RIGHT UPPER ARM LIPOMA  2005   HEMORROIDECTOMY     INGUINAL HERNIA REPAIR Left 1984   IR NEPHROSTOMY PLACEMENT LEFT  01/05/2019   KNEE ARTHROSCOPY Left X3  LAST ONE  2002   ORIF LEFT HUMEROUS FX  1976    POLYPECTOMY  05/11/2018   Procedure: POLYPECTOMY;  Surgeon: Corbin Ade, MD;  [facility];  Service: Endoscopy;;   PROSTATE SURGERY     RIGHT HEART CATH AND CORONARY ANGIOGRAPHY N/A 10/09/2022   Procedure: RIGHT HEART CATH AND CORONARY ANGIOGRAPHY;  Surgeon: [name], MD;  [facility];  Service: Cardiovascular;  Laterality: N/A;   ROBOT ASSISTED LAPAROSCOPIC NEPHRECTOMY Left 07/14/2019   Procedure: XI ROBOTIC ASSISTED LAPAROSCOPIC RETROPERITONEAL NEPHRECTOMY;  Surgeon: Sebastian Ache, MD;  Location: [facility];  Service: Urology;  Laterality: Left;  3 HRS   THYROIDECTOMY Bilateral 04/14/2023   Procedure: TOTAL THYROIDECTOMY;  Surgeon: [name], MD;  Location: [facility];  Service: ENT;  Laterality: Bilateral;   TONSILLECTOMY  AS CHILD   TOTAL KNEE ARTHROPLASTY Left 2006   REVISION 2007  (AFTER I & D WITH ANTIBIOTIC  SPACER PROCEDURE FOR STEPH INFECTION)   TOTAL KNEE ARTHROPLASTY Right 03/30/2016   Procedure: RIGHT TOTAL KNEE ARTHROPLASTY;  Surgeon: Durene Romans, MD;  Location: [facility];  Service: Orthopedics;  Laterality: Right;   TOTAL SHOULDER ARTHROPLASTY Right 04/06/2013   Procedure: RIGHT TOTAL SHOULDER ARTHROPLASTY;  Surgeon: Senaida Lange, MD;  Location: [facility];  Service: Orthopedics;  Laterality: Right;   TOTAL SHOULDER ARTHROPLASTY Left 07/20/2013   Procedure: LEFT TOTAL SHOULDER ARTHROPLASTY;  Surgeon: Senaida Lange, MD;  Location: [facility];  Service: Orthopedics;  Laterality: Left;   TRANSURETHRAL RESECTION OF BLADDER TUMOR N/A 11/12/2015   Procedure: TRANSURETHRAL RESECTION OF BLADDER TUMOR (TURBT);  Surgeon: Jerilee Field, MD;  Location: [facility];  Service: Urology;  Laterality: N/A;   TRANSURETHRAL RESECTION OF BLADDER TUMOR WITH GYRUS (TURBT-GYRUS) N/A 12/05/2013   Procedure: TRANSURETHRAL RESECTION OF BLADDER TUMOR WITH GYRUS (TURBT-GYRUS);  Surgeon: Jerilee Field, MD;  Location: [facility];  Service: Urology;   Laterality: N/A;    Current Medications: Current Meds  Medication Sig   acetaminophen (TYLENOL) 650 MG CR tablet Take 1,300 mg by mouth daily as needed for pain.   Albuterol-Budesonide (AIRSUPRA) 90-80 MCG/ACT AERO Inhale 2 puffs into the lungs every 6 (six) hours as needed (shortness of breath/ wheeze).   atorvastatin (LIPITOR) 10 MG tablet Take 1 tablet (10 mg total) by mouth daily.   fluticasone (FLONASE) 50 MCG/ACT nasal spray Place 1 spray into both nostrils daily.   furosemide (LASIX) 20 MG tablet Take 1 tablet (20 mg) daily as needed if you gain more than 3 pounds in 1 day or 5 pounds in 1 week.   levothyroxine (SYNTHROID) 100 MCG tablet Take 1 tablet (100 mcg total) by mouth daily.   metoprolol succinate (TOPROL-XL) 25 MG 24 hr tablet Take 1 tablet (25 mg total) by mouth daily.   Multiple Vitamin (MULTIVITAMIN) capsule Take 1 capsule by mouth daily.   ondansetron (ZOFRAN-ODT) 4 MG disintegrating tablet Take 1 tablet (4 mg total) by mouth every 8 (eight) hours as needed for nausea or vomiting.   polyvinyl alcohol (LIQUIFILM TEARS) 1.4 % ophthalmic solution Place 1 drop into both eyes as needed for dry eyes.   spironolactone (ALDACTONE) 25 MG tablet Take 12.5 mg by mouth daily.   [DISCONTINUED] atorvastatin (LIPITOR) 20 MG tablet Take 1 tablet (20 mg total) by mouth daily.     Allergies:   Patient has no known allergies.   Social History   Socioeconomic History   Marital status: Married    Spouse name: Peggy   Number of children: 2   Years of education: 14   Highest education level: Tax adviser degree: occupational, Scientist, product/process development, or vocational program  Occupational History   Occupation: retired    Comment: [employer], 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   Tobacco Use   Smoking status: Former    Current packs/day: 0.00    Average packs/day: 2.0 packs/day for 40.0 years (80.0 ttl pk-yrs)    Types: Cigarettes    Start date: 06/01/1957    Quit date: 06/01/1997    Years since quitting: 26.0   Smokeless  tobacco: Never  Vaping Use   Vaping status: Never Used  Substance and Sexual Activity   Alcohol use: No   Drug use: No   Sexual activity: Not Currently  Other Topics Concern   Not on file  Social History Narrative   One level home with wife    63/2578 - 71 year old MIL lives with them   Caffiene 2  cups daily, tea occas.   Retired, Visual merchandiser   Social Drivers of Corporate investment banker Strain: Medium Risk (12/11/2022)   Overall Financial Resource Strain (CARDIA)    Difficulty of Paying Living Expenses: Somewhat hard  Food Insecurity: No Food Insecurity (06/24/2023)   Hunger Vital Sign    Worried About Running Out of Food in the Last Year: Never true    Ran Out of Food in the Last Year: Never true  Transportation Needs: No Transportation Needs (06/24/2023)   PRAPARE - Administrator, Civil Service (Medical): No    Lack of Transportation (Non-Medical): No  Physical Activity: Inactive (12/11/2022)   Exercise Vital Sign    Days of Exercise per Week: 0 days    Minutes of Exercise per Session: 0 min  Stress: No Stress Concern Present (12/11/2022)   [employer] of Occupational Health - Occupational Stress Questionnaire    Feeling of Stress : Not at all  Social Connections: Socially Integrated (12/11/2022)   Social Connection and Isolation Panel [NHANES]    Frequency of Communication with Friends and Family: Three times a week    Frequency of Social Gatherings with Friends and Family: Once a week    Attends Religious Services: 1 to 4 times per year    Active Member of [employer] or Organizations: Yes    Attends Engineer, structural: 1 to 4 times per year    Marital Status: Married     Family History: The patient's family history includes Alzheimer's disease in his mother; Congestive Heart Failure in his brother; Diabetes in his daughter; Heart attack in his father; Heart disease in his father; Irregular heart beat in his brother. There is no history of Sleep  apnea.  ROS:   Please see the history of present illness.     All other systems reviewed and are negative.  EKGs/Labs/Other Studies Reviewed:    The following studies were reviewed today:   EKG:   03/18/23: BiV paced, rate 111 10/06/22: Sinus rhythm with first-degree AV block, rate 88, nonspecific intraventricular conduction delay, right axis deviation  Recent Labs: 05/19/2023: ALT 16; B Natriuretic Peptide 64.6; BUN 23; Creatinine, Ser 1.60; Hemoglobin 13.4; Magnesium 1.9; Platelets 186; Potassium 4.2; Sodium 136; TSH 3.969  Recent Lipid Panel    Component Value Date/Time   CHOL 93 (L) 03/18/2023 1121   TRIG 253 (H) 03/18/2023 1121   HDL 21 (L) 03/18/2023 1121   CHOLHDL 4.4 03/18/2023 1121   LDLCALC 33 03/18/2023 1121   LDLDIRECT 96 02/25/2016 1352    Physical Exam:    VS:  BP 118/68 (BP Location: Left Arm, Patient Position: Sitting, Cuff Size: Normal)   Pulse 72   Ht 5\' 6"  (1.676 m)   Wt 213 lb (96.6 kg)   BMI 34.38 kg/m     Wt Readings from Last 3 Encounters:  06/28/23 213 lb (96.6 kg)  06/21/23 213 lb 9.6 oz (96.9 kg)  05/17/23 218 lb (98.9 kg)     GEN:  Well nourished, well developed in no acute distress HEENT: Normal NECK: No JVD; No carotid bruits CARDIAC: RRR, no murmurs, rubs, gallops RESPIRATORY:  Clear to auscultation without rales, wheezing or rhonchi  ABDOMEN: Soft, non-tender, non-distended MUSCULOSKELETAL:  No edema; No deformity  SKIN: Warm and dry NEUROLOGIC:  Alert and oriented x 3 PSYCHIATRIC:  Normal affect   ASSESSMENT:    1. Chronic combined systolic and diastolic heart failure (HCC)   2. Essential hypertension   3. Acute  pericarditis, unspecified type   4. Coronary artery disease involving native coronary artery of native heart without angina pectoris   5. CKD stage 3a, GFR 45-59 ml/min (HCC)     PLAN:     Chronic combined heart failure: Echo 10/17/2022 showed EF less than 20%, mildly reduced RV systolic dysfunction, RVSP 44,  severe left atrial enlargement, mild MR, dilated ascending aorta measuring 45 mm.  RHC/LHC on 10/09/2022 showed nonobstructive CAD (30% RCA stenosis, RA 6 RV 50/3, PA 55/26/37 PCWP 27 CI 3.85.  Echocardiogram 12/2022 showed EF remains less than 20%.  He was referred to EP and underwent BiV ICD insertion with Dr. Nelly Laurence on 03/01/2023.  Cardiac MRI 05/08/2023 showed no LGE, unable to quantify LV/RV volumes and EF due to heart defect from patient's device, dilated ascending aorta measuring 44 mm.   Echocardiogram 05/14/2023 showed EF 25 to 30%, grade 1 diastolic dysfunction, mild RV dysfunction. -Continue Lasix 20 mg daily as needed.  Would monitor daily weights and take Lasix if gains more than 3 pounds in 1 day or 5 pounds in 1 week -Continue Toprol-XL 25 mg daily -Continue losartan 25 mg daily.   -Continue spironolactone 12.5 mg daily -Hold off on SGLT2 inhibitor given frequent UTIs -Soft BP, unable to titrate GDMT -Check CMET/magnesium  Pericarditis: He was admitted 05/2023 with chest pain.  Suspected pericarditis, he was started on aspirin taper and colchicine.  He had to stop colchicine due to diarrhea.  Reports symptoms no recent chest pain  CAD: LHC on 10/09/2022 showed nonobstructive CAD with 30% RCA stenosis. -LDL 94 on 07/07/2022, goal less than 70.  Started atorvastatin 20 mg daily.  LDL 33 on 03/18/2023.  He is reporting muscle cramps, will decrease atorvastatin to 10 mg daily.  Check electrolytes  Palpitations: Zio patch x 10 days 10/2022 showed 1 episode of NSVT lasting 8 beats, 10 episodes of SVT with longest lasting 19 beats. -Continue Toprol-XL 25 mg daily  Aortic aneurysm: Ascending aortic aneurysm measuring 45 mm on CTPA 08/2022.  Will monitor  CKD 3A: Has history of left nephrectomy due to bladder cancer.  Baseline creatinine appears 1.3-1.5.  Creatinine was up to 1.8 during admission 09/2022.  Most recent creatinine 1.55 on 05/16/2023.  Follows with nephrology.  Check  CMET  Hypertension: On Toprol-XL, losartan, and spironolactone as above  OSA: Reports compliance with CPAP  Thyroid cancer: Status post thyroidectomy 04/2023  RTC in 3 months  Medication Adjustments/Labs and Tests Ordered: Current medicines are reviewed at length with the patient today.  Concerns regarding medicines are outlined above.  Orders Placed This Encounter  Procedures   Comprehensive Metabolic Panel (CMET)   Magnesium   Meds ordered this encounter  Medications   atorvastatin (LIPITOR) 10 MG tablet    Sig: Take 1 tablet (10 mg total) by mouth daily.    Dispense:  90 tablet    Refill:  3    Patient Instructions  Medication Instructions: Start Atorvatatin 10 mg daily Stop Atorvastatin 20 mg dily Continue all current medications *If you need a refill on your cardiac medications before your next appointment, please call your pharmacy*   Lab Work: Cmet,mg today If you have labs (blood work) drawn today and your tests are completely normal, you will receive your results only by: MyChart Message (if you have MyChart) OR A paper copy in the mail If you have any lab test that is abnormal or we need to change your treatment, we will call you to review the results.  Testing/Procedures: none   Follow-Up: At [facility], you and your health needs are our priority.  As part of our continuing mission to provide you with exceptional heart care, we have created designated Provider Care Teams.  These Care Teams include your primary Cardiologist (physician) and Advanced Practice Providers (APPs -  Physician Assistants and Nurse Practitioners) who all work together to provide you with the care you need, when you need it.  We recommend signing up for the patient portal called "MyChart".  Sign up information is provided on this After Visit Summary.  MyChart is used to connect with patients for Virtual Visits (Telemedicine).  Patients are able to view lab/test results,  encounter notes, upcoming appointments, etc.  Non-urgent messages can be sent to your provider as well.   To learn more about what you can do with MyChart, go to [email or web address].    Your next appointment:   3 month(s)  Provider:   Little Ishikawa, MD     Other Instructions none         Signed, Little Ishikawa, MD  06/28/2023 5:46 PM    [facility]

## 2023-06-28 ENCOUNTER — Ambulatory Visit: Payer: Medicare Other | Attending: Cardiology | Admitting: Cardiology

## 2023-06-28 ENCOUNTER — Encounter: Payer: Self-pay | Admitting: Cardiovascular Disease

## 2023-06-28 ENCOUNTER — Encounter: Payer: Self-pay | Admitting: Cardiology

## 2023-06-28 VITALS — BP 118/68 | HR 72 | Ht 66.0 in | Wt 213.0 lb

## 2023-06-28 DIAGNOSIS — I2510 Atherosclerotic heart disease of native coronary artery without angina pectoris: Secondary | ICD-10-CM

## 2023-06-28 DIAGNOSIS — I5042 Chronic combined systolic (congestive) and diastolic (congestive) heart failure: Secondary | ICD-10-CM

## 2023-06-28 DIAGNOSIS — I309 Acute pericarditis, unspecified: Secondary | ICD-10-CM

## 2023-06-28 DIAGNOSIS — N1831 Chronic kidney disease, stage 3a: Secondary | ICD-10-CM

## 2023-06-28 DIAGNOSIS — I10 Essential (primary) hypertension: Secondary | ICD-10-CM

## 2023-06-28 MED ORDER — ATORVASTATIN CALCIUM 10 MG PO TABS: 10.0000 mg | ORAL_TABLET | Freq: Every day | ORAL | 3 refills | Status: DC

## 2023-06-28 NOTE — Patient Instructions (Addendum)
Medication Instructions: Start Atorvatatin 10 mg daily Stop Atorvastatin 20 mg dily Continue all current medications *If you need a refill on your cardiac medications before your next appointment, please call your pharmacy*   Lab Work: Cmet,mg today If you have labs (blood work) drawn today and your tests are completely normal, you will receive your results only by: MyChart Message (if you have MyChart) OR A paper copy in the mail If you have any lab test that is abnormal or we need to change your treatment, we will call you to review the results.   Testing/Procedures: none   Follow-Up: At [facility], you and your health needs are our priority.  As part of our continuing mission to provide you with exceptional heart care, we have created designated Provider Care Teams.  These Care Teams include your primary Cardiologist (physician) and Advanced Practice Providers (APPs -  Physician Assistants and Nurse Practitioners) who all work together to provide you with the care you need, when you need it.  We recommend signing up for the patient portal called "MyChart".  Sign up information is provided on this After Visit Summary.  MyChart is used to connect with patients for Virtual Visits (Telemedicine).  Patients are able to view lab/test results, encounter notes, upcoming appointments, etc.  Non-urgent messages can be sent to your provider as well.   To learn more about what you can do with MyChart, go to [email or web address].    Your next appointment:   3 month(s)  Provider:   Little Ishikawa, MD     Other Instructions none

## 2023-06-29 ENCOUNTER — Encounter: Payer: Self-pay | Admitting: Oncology

## 2023-06-29 LAB — COMPREHENSIVE METABOLIC PANEL
ALT: 22 [IU]/L (ref 0–44)
AST: 44 [IU]/L — ABNORMAL HIGH (ref 0–40)
Albumin: 4 g/dL (ref 3.8–4.8)
Alkaline Phosphatase: 122 [IU]/L — ABNORMAL HIGH (ref 44–121)
BUN/Creatinine Ratio: 11 (ref 10–24)
BUN: 17 mg/dL (ref 8–27)
Bilirubin Total: 0.5 mg/dL (ref 0.0–1.2)
CO2: 25 mmol/L (ref 20–29)
Calcium: 9.3 mg/dL (ref 8.6–10.2)
Chloride: 98 mmol/L (ref 96–106)
Creatinine, Ser: 1.5 mg/dL — ABNORMAL HIGH (ref 0.76–1.27)
Globulin, Total: 2.9 g/dL (ref 1.5–4.5)
Glucose: 174 mg/dL — ABNORMAL HIGH (ref 70–99)
Potassium: 4.3 mmol/L (ref 3.5–5.2)
Sodium: 137 mmol/L (ref 134–144)
Total Protein: 6.9 g/dL (ref 6.0–8.5)
eGFR: 48 mL/min/{1.73_m2} — ABNORMAL LOW (ref >59)

## 2023-06-29 LAB — MAGNESIUM: Magnesium: 2.1 mg/dL (ref 1.6–2.3)

## 2023-07-01 ENCOUNTER — Encounter: Payer: Self-pay | Admitting: *Deleted

## 2023-07-01 NOTE — Progress Notes (Signed)
PATIENT: Billy Rasmussen DOB: [birth year]  REASON FOR VISIT: follow up HISTORY FROM: patient   Virtual Visit via Video Note  I connected with Billy Rasmussen on 07/02/23 at 11:15 AM EST by a video enabled telemedicine application located remotely at [facility] and verified that I am speaking with the correct person using two identifiers who was located at their own home.   I discussed the limitations of evaluation and management by telemedicine and the availability of in person appointments. The patient expressed understanding and agreed to proceed.   PATIENT: Billy Rasmussen DOB: [birth year]  REASON FOR VISIT: follow up HISTORY FROM: patient  HISTORY OF PRESENT ILLNESS: Today 07/02/23:  Billy Rasmussen is a 78 y.o. male with a history of OSA on CPAP . Returns today for follow-up. Reports that CPAP is working well. Needs new supplies. DL is below.       REVIEW OF SYSTEMS: Out of a complete 14 system review of symptoms, the patient complains only of the following symptoms, and all other reviewed systems are negative.  ALLERGIES: No Known Allergies  HOME MEDICATIONS: Outpatient Medications Prior to Visit  Medication Sig Dispense Refill   acetaminophen (TYLENOL) 650 MG CR tablet Take 1,300 mg by mouth daily as needed for pain.     Albuterol-Budesonide (AIRSUPRA) 90-80 MCG/ACT AERO Inhale 2 puffs into the lungs every 6 (six) hours as needed (shortness of breath/ wheeze).     atorvastatin (LIPITOR) 10 MG tablet Take 1 tablet (10 mg total) by mouth daily. 90 tablet 3   fluticasone (FLONASE) 50 MCG/ACT nasal spray Place 1 spray into both nostrils daily.     furosemide (LASIX) 20 MG tablet Take 1 tablet (20 mg) daily as needed if you gain more than 3 pounds in 1 day or 5 pounds in 1 week. 45 tablet 3   levothyroxine (SYNTHROID) 100 MCG tablet Take 1 tablet (100 mcg total) by mouth daily. 30 tablet 6   losartan (COZAAR) 25 MG tablet Take 1 tablet (25 mg  total) by mouth daily. 90 tablet 1   metoprolol succinate (TOPROL-XL) 25 MG 24 hr tablet Take 1 tablet (25 mg total) by mouth daily. 90 tablet 1   Multiple Vitamin (MULTIVITAMIN) capsule Take 1 capsule by mouth daily.     ondansetron (ZOFRAN-ODT) 4 MG disintegrating tablet Take 1 tablet (4 mg total) by mouth every 8 (eight) hours as needed for nausea or vomiting. 20 tablet 0   polyvinyl alcohol (LIQUIFILM TEARS) 1.4 % ophthalmic solution Place 1 drop into both eyes as needed for dry eyes. 15 mL 0   spironolactone (ALDACTONE) 25 MG tablet Take 12.5 mg by mouth daily.     No facility-administered medications prior to visit.    PAST MEDICAL HISTORY: Past Medical History:  Diagnosis Date   AICD (automatic cardioverter/defibrillator) present    [employer]/St Jude   Arthritis    knees, shoulders, elbows   Bladder cancer [facility])    urologist-  dr Mena Goes   CHF (congestive heart failure) (HCC)    Diverticulosis of colon    Fibromyalgia    GERD (gastroesophageal reflux disease)    History of diverticulitis of colon    History of gastric ulcer    due to aleve   Hypertension    Lower urinary tract symptoms (LUTS)    OSA (obstructive sleep apnea)    Wears CPAP nightly   PONV (postoperative nausea and vomiting)    Psoriasis    Sepsis (HCC)  january 2021   Tinnitus    right ear more, has tranmitter in right ear removable at hs   Wears glasses    Wears partial dentures     PAST SURGICAL HISTORY: Past Surgical History:  Procedure Laterality Date   APPENDECTOMY     BIV ICD INSERTION CRT-D N/A 03/01/2023   Procedure: BIV ICD INSERTION CRT-D;  Surgeon: Maurice Small, MD;  Location: [facility] INVASIVE CV LAB;  Service: Cardiovascular;  Laterality: N/A;   COLECTOMY W/ COLOSTOMY  1996   W/   APPENDECTOMY   COLONOSCOPY N/A 05/11/2018   Procedure: COLONOSCOPY;  Surgeon: Corbin Ade, MD;  Location: [facility];  Service: Endoscopy;  Laterality: N/A;  9:30   COLOSTOMY TAKEDOWN  1996    CYSTOSCOPY WITH BIOPSY N/A 12/05/2013   Procedure: CYSTO BLADDER BIOPSY AND FULGERATION;  Surgeon: Jerilee Field, MD;  Location: [facility];  Service: Urology;  Laterality: N/A;   CYSTOSCOPY WITH BIOPSY Bilateral 11/13/2014   Procedure: CYSTOSCOPY WITH  BLADDER BIOPSY FULGERATION AND BILATERAL RETROGRADE PYELOGRAMS;  Surgeon: Jerilee Field, MD;  Location: [facility];  Service: Urology;  Laterality: Bilateral;   CYSTOSCOPY WITH FULGERATION N/A 01/18/2018   Procedure: CYSTOSCOPY WITH FULGERATION/ BLADDER BIOPSY;  Surgeon: Jerilee Field, MD;  Location: [facility];  Service: Urology;  Laterality: N/A;   CYSTOSCOPY WITH INJECTION N/A 11/09/2018   Procedure: CYSTOSCOPY WITH INJECTION OF INDOCYANINE GREEN DYE;  Surgeon: Sebastian Ache, MD;  Location: [facility];  Service: Urology;  Laterality: N/A;   CYSTOSCOPY WITH INSERTION OF UROLIFT N/A 01/18/2018   Procedure: CYSTOSCOPY WITH INSERTION OF UROLIFT;  Surgeon: Jerilee Field, MD;  Location: [facility];  Service: Urology;  Laterality: N/A;   CYSTOSCOPY/URETEROSCOPY/HOLMIUM LASER Left 02/17/2019   Procedure: URETEROSCOPY WITH BALLOON DILATION  AND NEPHROSTOGRAM;  Surgeon: Sebastian Ache, MD;  Location: [facility];  Service: Urology;  Laterality: Left;  1 HR   EXCISION RIGHT UPPER ARM LIPOMA  2005   HEMORROIDECTOMY     INGUINAL HERNIA REPAIR Left 1984   IR NEPHROSTOMY PLACEMENT LEFT  01/05/2019   KNEE ARTHROSCOPY Left X3  LAST ONE  2002   ORIF LEFT HUMEROUS FX  1976   POLYPECTOMY  05/11/2018   Procedure: POLYPECTOMY;  Surgeon: Corbin Ade, MD;  [facility]: [facility];  Service: Endoscopy;;   PROSTATE SURGERY     RIGHT HEART CATH AND CORONARY ANGIOGRAPHY N/A 10/09/2022   Procedure: RIGHT HEART CATH AND CORONARY ANGIOGRAPHY;  Surgeon: [name], MD;  Location: [facility] INVASIVE CV LAB;  Service: Cardiovascular;  Laterality: N/A;   ROBOT ASSISTED LAPAROSCOPIC  NEPHRECTOMY Left 07/14/2019   Procedure: XI ROBOTIC ASSISTED LAPAROSCOPIC RETROPERITONEAL NEPHRECTOMY;  Surgeon: Sebastian Ache, MD;  Location: [facility];  Service: Urology;  Laterality: Left;  3 HRS   THYROIDECTOMY Bilateral 04/14/2023   Procedure: TOTAL THYROIDECTOMY;  Surgeon: [name], MD;  Location[facility];  Service: ENT;  Laterality: Bilateral;   TONSILLECTOMY  AS CHILD   TOTAL KNEE ARTHROPLASTY Left 2006   REVISION 2007  (AFTER I & D WITH ANTIBIOTIC SPACER PROCEDURE FOR STEPH INFECTION)   TOTAL KNEE ARTHROPLASTY Right 03/30/2016   Procedure: RIGHT TOTAL KNEE ARTHROPLASTY;  Surgeon: Durene Romans, MD;  Location: [facility];  Service: Orthopedics;  Laterality: Right;   TOTAL SHOULDER ARTHROPLASTY Right 04/06/2013   Procedure: RIGHT TOTAL SHOULDER ARTHROPLASTY;  Surgeon: Senaida Lange, MD;  Location: [facility];  Service: Orthopedics;  Laterality: Right;   TOTAL SHOULDER ARTHROPLASTY  Left 07/20/2013   Procedure: LEFT TOTAL SHOULDER ARTHROPLASTY;  Surgeon: Senaida Lange, MD;  Location: [facility];  Service: Orthopedics;  Laterality: Left;   TRANSURETHRAL RESECTION OF BLADDER TUMOR N/A 11/12/2015   Procedure: TRANSURETHRAL RESECTION OF BLADDER TUMOR (TURBT);  Surgeon: Jerilee Field, MD;  Location: [facility];  Service: Urology;  Laterality: N/A;   TRANSURETHRAL RESECTION OF BLADDER TUMOR WITH GYRUS (TURBT-GYRUS) N/A 12/05/2013   Procedure: TRANSURETHRAL RESECTION OF BLADDER TUMOR WITH GYRUS (TURBT-GYRUS);  Surgeon: Jerilee Field, MD;  Location: [facility];  Service: Urology;  Laterality: N/A;    FAMILY HISTORY: Family History  Problem Relation Age of Onset   Alzheimer's disease Mother    Heart attack Father    Heart disease Father    Irregular heart beat Brother        DEFIB.   Congestive Heart Failure Brother    Diabetes Daughter    Sleep apnea Neg Hx     SOCIAL HISTORY: Social History   Socioeconomic History   Marital status: Married    Spouse name: Peggy    Number of children: 2   Years of education: 14   Highest education level: Tax adviser degree: occupational, Scientist, product/process development, or vocational program  Occupational History   Occupation: retired    Comment: 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   Tobacco Use   Smoking status: Former    Current packs/day: 0.00    Average packs/day: 2.0 packs/day for 40.0 years (80.0 ttl pk-yrs)    Types: Cigarettes    Start date: 06/01/1957    Quit date: 06/01/1997    Years since quitting: 26.1   Smokeless tobacco: Never  Vaping Use   Vaping status: Never Used  Substance and Sexual Activity   Alcohol use: No   Drug use: No   Sexual activity: Not Currently  Other Topics Concern   Not on file  Social History Narrative   One level home with wife    36/174 - 66 year old MIL lives with them   Caffiene 2 cups daily, tea occas.   Retired, Visual merchandiser   Social Drivers of Corporate investment banker Strain: Medium Risk (12/11/2022)   Overall Financial Resource Strain (CARDIA)    Difficulty of Paying Living Expenses: Somewhat hard  Food Insecurity: No Food Insecurity (06/24/2023)   Hunger Vital Sign    Worried About Running Out of Food in the Last Year: Never true    Ran Out of Food in the Last Year: Never true  Transportation Needs: No Transportation Needs (06/24/2023)   PRAPARE - Administrator, Civil Service (Medical): No    Lack of Transportation (Non-Medical): No  Physical Activity: Inactive (12/11/2022)   Exercise Vital Sign    Days of Exercise per Week: 0 days    Minutes of Exercise per Session: 0 min  Stress: No Stress Concern Present (12/11/2022)   [employer] of Occupational Health - Occupational Stress Questionnaire    Feeling of Stress : Not at all  Social Connections: Socially Integrated (12/11/2022)   Social Connection and Isolation Panel [NHANES]    Frequency of Communication with Friends and Family: Three times a week    Frequency of Social Gatherings with Friends and Family: Once a week     Attends Religious Services: 1 to 4 times per year    Active Member of [employer] or Organizations: Yes    Attends Banker Meetings: 1 to 4 times per year    Marital Status: Married  Intimate  Partner Violence: Not At Risk (06/24/2023)   Humiliation, Afraid, Rape, and Kick questionnaire    Fear of Current or Ex-Partner: No    Emotionally Abused: No    Physically Abused: No    Sexually Abused: No      PHYSICAL EXAM Generalized: Well developed, in no acute distress   Neurological examination  Mentation: Alert oriented to time, place, history taking. Follows all commands speech and language fluent Cranial nerve II-XII:Extraocular movements were full. Facial symmetry noted.   DIAGNOSTIC DATA (LABS, IMAGING, TESTING) - I reviewed patient records, labs, notes, testing and imaging myself where available.  Lab Results  Component Value Date   WBC 9.6 05/19/2023   HGB 13.4 05/19/2023   HCT 40.5 05/19/2023   MCV 93.1 05/19/2023   PLT 186 05/19/2023      Component Value Date/Time   NA 137 06/28/2023 1529   K 4.3 06/28/2023 1529   CL 98 06/28/2023 1529   CO2 25 06/28/2023 1529   GLUCOSE 174 (H) 06/28/2023 1529   GLUCOSE 92 05/19/2023 1214   BUN 17 06/28/2023 1529   CREATININE 1.50 (H) 06/28/2023 1529   CREATININE 1.47 (H) 11/11/2022 1053   CALCIUM 9.3 06/28/2023 1529   PROT 6.9 06/28/2023 1529   ALBUMIN 4.0 06/28/2023 1529   AST 44 (H) 06/28/2023 1529   AST 43 (H) 11/11/2022 1053   ALT 22 06/28/2023 1529   ALT 25 11/11/2022 1053   ALKPHOS 122 (H) 06/28/2023 1529   BILITOT 0.5 06/28/2023 1529   BILITOT 0.8 11/11/2022 1053   GFRNONAA 46 (L) 05/19/2023 1214   GFRNONAA 49 (L) 11/11/2022 1053   GFRAA 56 (L) 04/10/2020 1421   Lab Results  Component Value Date   CHOL 93 (L) 03/18/2023   HDL 21 (L) 03/18/2023   LDLCALC 33 03/18/2023   LDLDIRECT 96 02/25/2016   TRIG 253 (H) 03/18/2023   CHOLHDL 4.4 03/18/2023   Lab Results  Component Value Date   HGBA1C 5.1  07/07/2022   Lab Results  Component Value Date   VITAMINB12 418 08/21/2022   Lab Results  Component Value Date   TSH 3.969 05/19/2023      ASSESSMENT AND PLAN 78 y.o. year old male  has a past medical history of AICD (automatic cardioverter/defibrillator) present, Arthritis, Bladder cancer (HCC), CHF (congestive heart failure) (HCC), Diverticulosis of colon, Fibromyalgia, GERD (gastroesophageal reflux disease), History of diverticulitis of colon, History of gastric ulcer, Hypertension, Lower urinary tract symptoms (LUTS), OSA (obstructive sleep apnea), PONV (postoperative nausea and vomiting), Psoriasis, Sepsis (HCC), Tinnitus, Wears glasses, and Wears partial dentures. here with:  OSA on CPAP  CPAP compliance excellent Residual AHI is good Encouraged patient to continue using CPAP nightly and > 4 hours each night Order placed for new supplies F/U in 1 year or sooner if needed   Butch Penny, MSN, NP-C 07/02/2023, 11:09 AM [facility] [address] [place], [place] [address] [phone]

## 2023-07-02 ENCOUNTER — Telehealth (INDEPENDENT_AMBULATORY_CARE_PROVIDER_SITE_OTHER): Payer: Medicare Other | Admitting: Adult Health

## 2023-07-02 DIAGNOSIS — G4733 Obstructive sleep apnea (adult) (pediatric): Secondary | ICD-10-CM

## 2023-07-05 ENCOUNTER — Ambulatory Visit: Payer: Self-pay | Admitting: Family Medicine

## 2023-07-05 NOTE — Progress Notes (Unsigned)
Virtual Visit via video Note Due to COVID-19 pandemic this visit was conducted virtually. This visit type was conducted due to national recommendations for restrictions regarding the COVID-19 Pandemic (e.g. social distancing, sheltering in place) in an effort to limit this patient's exposure and mitigate transmission in our community. All issues noted in this document were discussed and addressed.  A physical exam was not performed with this format.   I connected with Billy Rasmussen on 07/06/2023 at 0845 by name and DOB and verified that I am speaking with the correct person using two identifiers. Billy Rasmussen is currently located at home during visit. The provider, Martina Sinner, DNP is located in their office at time of visit.  I discussed the limitations, risks, security and privacy concerns of performing an evaluation and management service by virtual visit and the availability of in person appointments. I also discussed with the patient that there may be a patient responsible charge related to this service. The patient expressed understanding and agreed to proceed.  Subjective:  Patient ID: Billy Rasmussen, male    DOB: 1945-08-26, 78 y.o.   MRN: [id number]  Chief Complaint:  Sinusitis (" 3 weeks of nose bleed and lost taste. He uses a C-PAP")   HPI: Billy Rasmussen is a 78 y.o. male presenting on 07/06/2023 for Sinusitis (" 3 weeks of nose bleed and lost taste. He uses a C-PAP")  The patient is a 78 year old male with a history of thyroid cancer, currently undergoing post-treatment care. He presents virtually with complaints of persistent nosebleeds and a loss of taste, which he reports have been ongoing since the completion of his radiation therapy. The patient indicates that he has been experiencing these symptoms for an extended period of time and initially hoped they would resolve on their own, but they have not improved. He reports that despite using a CPAP machine for  sleep apnea and attempting to use a humidifier in the room to manage dryness, the nosebleeds persist and the use of humidification has not been effective in providing relief. The patient expresses frustration with the ongoing nature of the symptoms. He denies any other recent significant changes in health or additional symptoms, such as dizziness or severe pain.   Relevant past medical, surgical, family, and social history reviewed and updated as indicated.  Allergies and medications reviewed and updated.   Past Medical History:  Diagnosis Date   AICD (automatic cardioverter/defibrillator) present    Abbott/St Jude   Arthritis    knees, shoulders, elbows   Bladder cancer [facility])    urologist-  dr Mena Goes   CHF (congestive heart failure) (HCC)    Diverticulosis of colon    Fibromyalgia    GERD (gastroesophageal reflux disease)    History of diverticulitis of colon    History of gastric ulcer    due to aleve   Hypertension    Lower urinary tract symptoms (LUTS)    OSA (obstructive sleep apnea)    Wears CPAP nightly   PONV (postoperative nausea and vomiting)    Psoriasis    Sepsis (HCC)    january 2021   Tinnitus    right ear more, has tranmitter in right ear removable at hs   Wears glasses    Wears partial dentures     Past Surgical History:  Procedure Laterality Date   APPENDECTOMY     BIV ICD INSERTION CRT-D N/A 03/01/2023   Procedure: BIV ICD INSERTION CRT-D;  Surgeon: York Pellant  E, MD;  [facility];  Service: Cardiovascular;  Laterality: N/A;   COLECTOMY W/ COLOSTOMY  1996   W/   APPENDECTOMY   COLONOSCOPY N/A 05/11/2018   Procedure: COLONOSCOPY;  Surgeon: Corbin Ade, MD;  [facility];  Service: Endoscopy;  Laterality: N/A;  9:30   COLOSTOMY TAKEDOWN  1996   CYSTOSCOPY WITH BIOPSY N/A 12/05/2013   Procedure: CYSTO BLADDER BIOPSY AND FULGERATION;  Surgeon: Jerilee Field, MD;  Location: [facility];  Service:  Urology;  Laterality: N/A;   CYSTOSCOPY WITH BIOPSY Bilateral 11/13/2014   Procedure: CYSTOSCOPY WITH  BLADDER BIOPSY FULGERATION AND BILATERAL RETROGRADE PYELOGRAMS;  Surgeon: Jerilee Field, MD;  Location: [facility];  Service: Urology;  Laterality: Bilateral;   CYSTOSCOPY WITH FULGERATION N/A 01/18/2018   Procedure: CYSTOSCOPY WITH FULGERATION/ BLADDER BIOPSY;  Surgeon: Jerilee Field, MD;  [facility];  Service: Urology;  Laterality: N/A;   CYSTOSCOPY WITH INJECTION N/A 11/09/2018   Procedure: CYSTOSCOPY WITH INJECTION OF INDOCYANINE GREEN DYE;  Surgeon: Sebastian Ache, MD;  Location: [facility];  Service: Urology;  Laterality: N/A;   CYSTOSCOPY WITH INSERTION OF UROLIFT N/A 01/18/2018   Procedure: CYSTOSCOPY WITH INSERTION OF UROLIFT;  Surgeon: Jerilee Field, MD;  Location: [facility];  Service: Urology;  Laterality: N/A;   CYSTOSCOPY/URETEROSCOPY/HOLMIUM LASER Left 02/17/2019   Procedure: URETEROSCOPY WITH BALLOON DILATION  AND NEPHROSTOGRAM;  Surgeon: Sebastian Ache, MD;  Location: [facility];  Service: Urology;  Laterality: Left;  1 HR   EXCISION RIGHT UPPER ARM LIPOMA  2005   HEMORROIDECTOMY     INGUINAL HERNIA REPAIR Left 1984   IR NEPHROSTOMY PLACEMENT LEFT  01/05/2019   KNEE ARTHROSCOPY Left X3  LAST ONE  2002   ORIF LEFT HUMEROUS FX  1976   POLYPECTOMY  05/11/2018   Procedure: POLYPECTOMY;  Surgeon: Corbin Ade, MD;  [facility];  Service: Endoscopy;;   PROSTATE SURGERY     RIGHT HEART CATH AND CORONARY ANGIOGRAPHY N/A 10/09/2022   Procedure: RIGHT HEART CATH AND CORONARY ANGIOGRAPHY;  Surgeon: [name], MD;  [facility];  Service: Cardiovascular;  Laterality: N/A;   ROBOT ASSISTED LAPAROSCOPIC NEPHRECTOMY Left 07/14/2019   Procedure: XI ROBOTIC ASSISTED LAPAROSCOPIC RETROPERITONEAL NEPHRECTOMY;  Surgeon: Sebastian Ache, MD;  Location[facility];  Service: Urology;   Laterality: Left;  3 HRS   THYROIDECTOMY Bilateral 04/14/2023   Procedure: TOTAL THYROIDECTOMY;  Surgeon: [name], MD;  Location: [facility];  Service: ENT;  Laterality: Bilateral;   TONSILLECTOMY  AS CHILD   TOTAL KNEE ARTHROPLASTY Left 2006   REVISION 2007  (AFTER I & D WITH ANTIBIOTIC SPACER PROCEDURE FOR STEPH INFECTION)   TOTAL KNEE ARTHROPLASTY Right 03/30/2016   Procedure: RIGHT TOTAL KNEE ARTHROPLASTY;  Surgeon: Durene Romans, MD;  [facility];  Service: Orthopedics;  Laterality: Right;   TOTAL SHOULDER ARTHROPLASTY Right 04/06/2013   Procedure: RIGHT TOTAL SHOULDER ARTHROPLASTY;  Surgeon: Senaida Lange, MD;  [facility];  Service: Orthopedics;  Laterality: Right;   TOTAL SHOULDER ARTHROPLASTY Left 07/20/2013   Procedure: LEFT TOTAL SHOULDER ARTHROPLASTY;  Surgeon: Senaida Lange, MD;  [facility];  Service: Orthopedics;  Laterality: Left;   TRANSURETHRAL RESECTION OF BLADDER TUMOR N/A 11/12/2015   Procedure: TRANSURETHRAL RESECTION OF BLADDER TUMOR (TURBT);  Surgeon: Jerilee Field, MD;  [facility];  Service: Urology;  Laterality: N/A;   TRANSURETHRAL RESECTION OF BLADDER TUMOR  WITH GYRUS (TURBT-GYRUS) N/A 12/05/2013   Procedure: TRANSURETHRAL RESECTION OF BLADDER TUMOR WITH GYRUS (TURBT-GYRUS);  Surgeon: Jerilee Field, MD;  Location: [facility];  Service: Urology;  Laterality: N/A;    Social History   Socioeconomic History   Marital status: Married    Spouse name: Peggy   Number of children: 2   Years of education: 14   Highest education level: Associate degree: academic program  Occupational History   Occupation: retired    Comment: Pharmacologist, 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   Retired, Visual merchandiser   Social Drivers of Corporate investment banker Strain: Medium Risk (07/05/2023)   Overall Financial Resource Strain (CARDIA)    Difficulty of Paying Living Expenses: Somewhat hard  Food Insecurity: No Food Insecurity (07/05/2023)   Hunger Vital Sign    Worried About Running Out of Food in the Last Year: Never true    Ran Out of Food in the Last Year: Never true  Transportation Needs: No Transportation Needs (07/05/2023)   PRAPARE - Administrator, Civil Service (Medical): No    Lack of Transportation (Non-Medical): No  Physical Activity: Insufficiently Active (07/05/2023)   Exercise Vital Sign    Days of Exercise per Week: 3 days    Minutes of Exercise per Session: 10 min  Stress: No Stress Concern Present (07/05/2023)   [employer] of Occupational Health - Occupational Stress Questionnaire    Feeling of Stress : Not at all  Social Connections: Socially Integrated (07/05/2023)   Social Connection and Isolation Panel [NHANES]    Frequency of Communication with Friends and Family: More than three times a week    Frequency of Social Gatherings with Friends and Family: Twice a week    Attends Religious Services: More than 4 times per year    Active Member of [employer] or Organizations: Yes    Attends Banker Meetings: Patient declined    Marital Status: Married  Catering manager Violence: Not At Risk (06/24/2023)   Humiliation, Afraid, Rape, and Kick questionnaire    Fear of Current or Ex-Partner: No    Emotionally Abused: No    Physically Abused: No    Sexually Abused: No    Outpatient Encounter Medications as of 07/06/2023  Medication  Sig   Saline (NASOGEL) GEL Place 1 application  into the nose 2 (two) times daily as needed.   acetaminophen (TYLENOL) 650 MG CR tablet Take 1,300 mg by mouth daily as needed for pain.   Albuterol-Budesonide (AIRSUPRA) 90-80 MCG/ACT AERO Inhale 2 puffs into the lungs every 6 (six) hours as needed (shortness of breath/ wheeze).   atorvastatin (LIPITOR) 10 MG tablet Take 1 tablet (10 mg total) by mouth daily.   fluticasone (FLONASE) 50 MCG/ACT nasal spray Place  1 spray into both nostrils daily.   furosemide (LASIX) 20 MG tablet Take 1 tablet (20 mg) daily as needed if you gain more than 3 pounds in 1 day or 5 pounds in 1 week.   levothyroxine (SYNTHROID) 100 MCG tablet Take 1 tablet (100 mcg total) by mouth daily.   losartan (COZAAR) 25 MG tablet Take 1 tablet (25 mg total) by mouth daily.   metoprolol succinate (TOPROL-XL) 25 MG 24 hr tablet Take 1 tablet (25 mg total) by mouth daily.   Multiple Vitamin (MULTIVITAMIN) capsule Take 1 capsule by mouth daily.   ondansetron (ZOFRAN-ODT) 4 MG disintegrating tablet Take 1 tablet (4 mg total) by mouth every 8 (eight) hours as needed for nausea or vomiting.   polyvinyl alcohol (LIQUIFILM TEARS) 1.4 % ophthalmic solution Place 1 drop into both eyes as needed for dry eyes.   spironolactone (ALDACTONE) 25 MG tablet Take 12.5 mg by mouth daily.   No facility-administered encounter medications on file as of 07/06/2023.    No Known Allergies  Review of Systems  Constitutional:  Negative for appetite change, fatigue and fever.  HENT:  Positive for congestion and nosebleeds. Negative for postnasal drip, sore throat and tinnitus.   Respiratory:  Negative for cough and chest tightness.   Cardiovascular:  Negative for chest pain and leg swelling.  Gastrointestinal:  Negative for abdominal pain, diarrhea, nausea and vomiting.       Lost of taste  Musculoskeletal:  Negative for arthralgias, myalgias and neck stiffness.  Skin:  Negative for color change, pallor  and rash.  Neurological:  Negative for dizziness, light-headedness and headaches.  Psychiatric/Behavioral:  Negative for suicidal ideas.     Observations/Objective: No vital signs or physical exam, this was a virtual health encounter.  Pt alert and oriented, answers all questions appropriately, and able to speak in full sentences.    Assessment and Plan: Green was seen today for sinusitis.  Diagnoses and all orders for this visit:  Bleeding from the nose -     Saline (NASOGEL) GEL; Place 1 application  into the nose 2 (two) times daily as needed.  Billy Rasmussen is 78 yrs old caucasian male seen today fro epistasis, no acute distress Epistasis due to radiation , will treat with naso gel and advise hi that he use Vaseline to keep his nostril moist Ageusia can be caused by radiation therapy or even C-pap affecting the taste buds advise on Zinc Supplements, Spicy or Strong-flavored Foods  spicy, sour, or bitter foods may help stimulate the taste buds  Follow Up Instructions: Return if symptoms worsen or fail to improve.  I discussed the assessment and treatment plan with the patient. The patient was provided an opportunity to ask questions and all were answered. The patient agreed with the plan and demonstrated an understanding of the instructions.   The patient was advised to call back or seek an in-person evaluation if the symptoms worsen or if the condition fails to improve as anticipated.  The above assessment and management plan was discussed with the patient. The patient verbalized understanding of and has agreed to the management plan. Patient is aware to call the clinic if they develop any new symptoms or if symptoms persist or worsen. Patient is aware when to return to the clinic for a follow-up visit. Patient educated on when it is appropriate to go to the emergency department.    I provided 12 minutes of time during this video encounter.   Karle Barr  Janee Morn,  DNP [facility] [address] [place], [place] [address] [phone] 07/06/2023

## 2023-07-05 NOTE — Progress Notes (Signed)
RE: needs new supplies Received: Today New, Doristine Mango, RN; Tomasa Rand; 1 other Received, thank you!     Previous Messages    ----- Message ----- From: Guy Begin, RN Sent: 07/05/2023   8:19 AM EST To: Alain Honey; Kathe Becton; * Subject: needs new supplies                            New order in EPIC for pt, new supplies.  Billey Chang. [facility] Male, 78 y.o., Jun 10, 1945 Pronouns: he/him/his MRN: [id number]   Margaretmary Eddy

## 2023-07-05 NOTE — Telephone Encounter (Signed)
  Chief Complaint: Nasal congestion Symptoms: bright red blood when blowing nose Frequency: persistent Pertinent Negatives: Patient denies fever Disposition: [] ED /[] Urgent Care (no appt availability in office) / [x] Appointment(In office/virtual)/ []  [place] [facility]/ [] Home Care/ [] Refused Recommended Disposition /[]  Mobile Bus/ []  Follow-up with PCP Additional Notes: Patient reports nasal congestion x 3 weeks. States that when he blows his nose its either really dry and sometime he get bright red blood streaks or tiny dark red clots. States that his sinuses feel "dry" Also endorsing post-nasal draining, cough, and sinus pain. Appt scheduled for 02/04 at 0845    Reason for Disposition  [1] Nasal discharge AND [2] present > 10 days  Answer Assessment - Initial Assessment Questions 1. LOCATION: "Where does it hurt?"      Sinus pain and nose  2. ONSET: "When did the sinus pain start?"  (e.g., hours, days)      3 weeks ago  3. SEVERITY: "How bad is the pain?"   (Scale 1-10; mild, moderate or severe)   - MILD (1-3): doesn't interfere with normal activities    - MODERATE (4-7): interferes with normal activities (e.g., work or school) or awakens from sleep   - SEVERE (8-10): excruciating pain and patient unable to do any normal activities        Mild-moderate...states that it's uncomfortable  4. RECURRENT SYMPTOM: "Have you ever had sinus problems before?" If Yes, ask: "When was the last time?" and "What happened that time?"      Does not recall happening before  5. NASAL CONGESTION: "Is the nose blocked?" If Yes, ask: "Can you open it or must you breathe through your mouth?"     One side or the other is blocked, but never both blocked completely  6. NASAL DISCHARGE: "Do you have discharge from your nose?" If so ask, "What color?"     Yes, bright red or dark red sometimes when blowing nose  7. FEVER: "Do you have a fever?" If Yes, ask: "What is it, how was it measured,  and when did it start?"      No  8. OTHER SYMPTOMS: "Do you have any other symptoms?" (e.g., sore throat, cough, earache, difficulty breathing)     Cough, post-nasal drainage  Protocols used: Sinus Pain or Congestion-A-AH

## 2023-07-05 NOTE — Telephone Encounter (Signed)
Noted patient has appt tomorrow 07/06/23

## 2023-07-06 ENCOUNTER — Telehealth (INDEPENDENT_AMBULATORY_CARE_PROVIDER_SITE_OTHER): Payer: Medicare Other | Admitting: Nurse Practitioner

## 2023-07-06 ENCOUNTER — Encounter: Payer: Self-pay | Admitting: Nurse Practitioner

## 2023-07-06 DIAGNOSIS — R432 Parageusia: Secondary | ICD-10-CM | POA: Diagnosis not present

## 2023-07-06 DIAGNOSIS — R040 Epistaxis: Secondary | ICD-10-CM

## 2023-07-06 MED ORDER — NASOGEL NA GEL: 1.0000 | Freq: Two times a day (BID) | NASAL | 0 refills | Status: DC | PRN

## 2023-07-10 ENCOUNTER — Other Ambulatory Visit: Payer: Self-pay | Admitting: Cardiovascular Disease

## 2023-07-12 ENCOUNTER — Telehealth: Payer: Self-pay | Admitting: Adult Health

## 2023-07-12 NOTE — Telephone Encounter (Signed)
 [facility] confirmed receipt of order on 2/3. I called [facility]. They are going to call the patient. The receptionist said he should just be able to call the resupply department.

## 2023-07-12 NOTE — Telephone Encounter (Signed)
 Pt said have not heard from any one about getting headgear or CPAP supplies.  Would like a call back.

## 2023-07-13 ENCOUNTER — Ambulatory Visit: Payer: Self-pay | Admitting: Family Medicine

## 2023-07-13 DIAGNOSIS — R040 Epistaxis: Secondary | ICD-10-CM

## 2023-07-13 NOTE — Addendum Note (Signed)
Addended by: Raliegh Ip on: 07/13/2023 12:02 PM   Modules accepted: Orders

## 2023-07-13 NOTE — Telephone Encounter (Signed)
Spoke to pt. Getting slightly better but still passing clots. Referral to ENT placed.

## 2023-07-13 NOTE — Telephone Encounter (Signed)
Patient would like for you to review and advise

## 2023-07-13 NOTE — Telephone Encounter (Signed)
Chief Complaint: Nose Bleed  Symptoms: continued nose bleeds passing clots sometimes Frequency: comes and goes Pertinent Negatives: Patient denies fever, headache, nasal congestion  Disposition: [] ED /[] Urgent Care (no appt availability in office) / [] Appointment(In office/virtual)/ []  [place] [facility]/ [x] Home Care/ [] Refused Recommended Disposition /[] [place] [facility]/ []  Follow-up with PCP Additional Notes: Patient states he continues to have nose bleeds with an onset about 4-5 weeks ago. Patient states the nose bleeds have improved but they have not completely stopped yet. Patient states when he blows his nose clots come out. Patient reports the Saline has been helpful and was advised to call to give an update on his symptoms about a week from his visit with the NP.  Patient states the bleeding is better but not completely gone. Patient requesting recommendations. Care advice was given.  Copied from CRM [id number]. Topic: Clinical - Red Word Triage >> Jul 13, 2023  8:34 AM Louie Casa B wrote: [facility] that prompted transfer to Nurse Triage: patient is calling because he was prescribed a rx to stop his nose from bleeding and patients nose is still bleeding not as bad but it is still bleeding Reason for Disposition  [1] Recent medical visit within 24 hours AND [2] condition / symptoms BETTER (improving) AND [3] caller has additional questions triager can answer  Answer Assessment - Initial Assessment Questions 1. MAIN CONCERN OR SYMPTOM:  "What is your main concern right now?" "What question do you have?" "What's the main symptom you're worried about?" (e.g., breathing difficulty, cough, fever. pain)     Nose bleed  2. ONSET: "When did the  nose bleed  start?"     4-5 weeks ago 3. BETTER-SAME-WORSE: "Are you getting better, staying the same, or getting worse compared to how you felt at your last visit to the doctor (most recent medical visit)?"     Better 4. VISIT DATE: "When were you  seen?" (Date)     07/06/23 5. VISIT DOCTOR: "What is the name of the doctor taking care of you now?"     Sandra 6. VISIT DIAGNOSIS:  "What was the main symptom or problem that you were seen for?" "Were you given a diagnosis?"      Nose bleed 7. VISIT MEDICINES: "Did the doctor order any new medicines for you to use?" If Yes, ask: "Have you filled the prescription and started taking the medicine?"      Saline nasal spray  8. NEXT APPOINTMENT: "Have you scheduled a follow-up appointment with your doctor?"     No  9. PAIN: "Is there any pain?" If Yes, ask: "How bad is it?"  (Scale 0-10; or mild, moderate, severe)    - NONE (0): no pain    - MILD (1-3): doesn't interfere with normal activities     - MODERATE (4-7): interferes with normal activities or awakens from sleep     - SEVERE (8-10): excruciating pain, unable to do any normal activities     Mild  10. FEVER: "Do you have a fever?" If Yes, ask: "What is it, how was it measured  and when did it start?"       No  11. OTHER SYMPTOMS: "Do you have any other symptoms?"       Blood clots  Protocols used: Recent Medical Visit for Illness Follow-up Call-A-AH

## 2023-07-14 NOTE — Progress Notes (Signed)
Remote ICD transmission.

## 2023-07-30 ENCOUNTER — Other Ambulatory Visit: Payer: Self-pay | Admitting: Family Medicine

## 2023-07-30 DIAGNOSIS — E039 Hypothyroidism, unspecified: Secondary | ICD-10-CM

## 2023-07-30 MED ORDER — LEVOTHYROXINE SODIUM 100 MCG PO TABS: 100.0000 ug | ORAL_TABLET | Freq: Every day | ORAL | 1 refills | Status: DC

## 2023-07-30 NOTE — Telephone Encounter (Signed)
 Pt called reporting that there is a manufacturer change with his pharmacy. This needs approval by his PCP prior to them sending him his next refill. Please advise

## 2023-07-30 NOTE — Addendum Note (Signed)
 Addended by: Julious Payer D on: 07/30/2023 01:12 PM   Modules accepted: Orders

## 2023-07-30 NOTE — Telephone Encounter (Signed)
 Copied from CRM [id number]. Topic: Clinical - Medication Refill >> Jul 30, 2023  8:35 AM Franchot Heidelberg wrote: Most Recent Primary Care Visit:  Provider: [facility] [place], [place]  Department: [facility]  Visit Type: ACUTE  Date: 07/06/2023  Medication: levothyroxine (SYNTHROID) 100 MCG tablet  Pt called reporting that there is a manufacturer change with his pharmacy. This needs approval by his PCP prior to them sending him his next refill. Please advise   Has the patient contacted their pharmacy? Yes (Agent: If no, request that the patient contact the pharmacy for the refill. If patient does not wish to contact the pharmacy document the reason why and proceed with request.) (Agent: If yes, when and what did the pharmacy advise?)  Is this the correct pharmacy for this prescription? Yes If no, delete pharmacy and type the correct one.  This is the patient's preferred pharmacy:  [id number] - [place], [place] - [address] [place] [address] Phone: [phone] Fax: [phone]   Has the prescription been filled recently? Yes  Is the patient out of the medication? No  Has the patient been seen for an appointment in the last year OR does the patient have an upcoming appointment? Yes  Can we respond through MyChart? Yes  Agent: Please be advised that Rx refills may take up to 3 business days. We ask that you follow-up with your pharmacy.

## 2023-07-30 NOTE — Telephone Encounter (Signed)
 Pt aware refill sent to pharmacy, lab appt made and orders placed.

## 2023-07-30 NOTE — Telephone Encounter (Signed)
 If no other options, of course change the manufacturer but I would like him to be seen for repeat TSH and free T4 roughly 6 weeks after starting the new prescription.

## 2023-08-01 ENCOUNTER — Encounter: Payer: Self-pay | Admitting: Family Medicine

## 2023-08-02 NOTE — Telephone Encounter (Signed)
 Pt states the pharmacy they never received the Rx levothyroxine (SYNTHROID) 100 MCG tablet . Cam you please resend?  Pt is down to 2 pills and very worried he will not be able to get.

## 2023-08-02 NOTE — Telephone Encounter (Signed)
 [facility], but got a script from [place] today changing the dose to 112 mcg.

## 2023-08-05 ENCOUNTER — Other Ambulatory Visit: Payer: Self-pay | Admitting: Thoracic Surgery (Cardiothoracic Vascular Surgery)

## 2023-08-05 DIAGNOSIS — I7121 Aneurysm of the ascending aorta, without rupture: Secondary | ICD-10-CM

## 2023-08-16 ENCOUNTER — Telehealth: Payer: Self-pay

## 2023-08-16 ENCOUNTER — Ambulatory Visit (INDEPENDENT_AMBULATORY_CARE_PROVIDER_SITE_OTHER): Payer: Medicare Other

## 2023-08-16 VITALS — Ht 66.0 in | Wt 213.0 lb

## 2023-08-16 DIAGNOSIS — Z0000 Encounter for general adult medical examination without abnormal findings: Secondary | ICD-10-CM

## 2023-08-16 NOTE — Telephone Encounter (Signed)
 Patient seen for AWV and states that he has had cough, congestion, and nasal drainage x 1 week.  Took Covid test today and he believes that it was positive but there may have been some user error.  Please advise.

## 2023-08-16 NOTE — Progress Notes (Signed)
 Subjective:   Billy Rasmussen is a 78 y.o. who presents for a Medicare Wellness preventive visit.  Visit Complete: Virtual I connected with  Billy Rasmussen on 08/16/23 by a audio enabled telemedicine application and verified that I am speaking with the correct person using two identifiers.  Patient Location: Home  Provider Location: Home Office  I discussed the limitations of evaluation and management by telemedicine. The patient expressed understanding and agreed to proceed.  Vital Signs: Because this visit was a virtual/telehealth visit, some criteria may be missing or patient reported. Any vitals not documented were not able to be obtained and vitals that have been documented are patient reported.  VideoDeclined- This patient declined Librarian, academic. Therefore the visit was completed with audio only.  Persons Participating in Visit: Patient.  AWV Questionnaire: Yes: Patient Medicare AWV questionnaire was completed by the patient on 08/11/23; I have confirmed that all information answered by patient is correct and no changes since this date.  Cardiac Risk Factors include: advanced age (>90men, >10 women);hypertension;male gender     Objective:    Today's Vitals   08/16/23 1531  Weight: 213 lb (96.6 kg)  Height: 5\' 6"  (1.676 m)   Body mass index is 34.38 kg/m.     08/16/2023    4:10 PM 05/13/2023    8:52 AM 04/14/2023   12:10 PM 04/14/2023   12:09 PM 04/07/2023   10:21 AM 03/01/2023   10:09 AM 10/07/2022    8:00 PM  Advanced Directives  Does Patient Have a Medical Advance Directive? No Yes  No Yes Yes Yes  Type of Sales promotion account executive of [employer] Power of [place];Living will Living will  Does patient want to make changes to medical advance directive?  No - Patient declined   No - Patient declined  No - Patient declined  Copy of Healthcare Power of Attorney in Chart?  No - copy  requested   No - copy requested    Would patient like information on creating a medical advance directive? Yes (MAU/Ambulatory/Procedural Areas - Information given)  No - Patient declined    No - Patient declined    Current Medications (verified) Outpatient Encounter Medications as of 08/16/2023  Medication Sig   acetaminophen (TYLENOL) 650 MG CR tablet Take 1,300 mg by mouth daily as needed for pain.   Albuterol-Budesonide (AIRSUPRA) 90-80 MCG/ACT AERO Inhale 2 puffs into the lungs every 6 (six) hours as needed (shortness of breath/ wheeze).   atorvastatin (LIPITOR) 10 MG tablet Take 1 tablet (10 mg total) by mouth daily.   fluticasone (FLONASE) 50 MCG/ACT nasal spray Place 1 spray into both nostrils daily.   furosemide (LASIX) 20 MG tablet Take 1 tablet (20 mg) daily as needed if you gain more than 3 pounds in 1 day or 5 pounds in 1 week.   levothyroxine (SYNTHROID) 100 MCG tablet Take 1 tablet (100 mcg total) by mouth daily.   losartan (COZAAR) 25 MG tablet Take 1 tablet (25 mg total) by mouth daily.   metoprolol succinate (TOPROL-XL) 25 MG 24 hr tablet Take 1 tablet (25 mg total) by mouth daily.   Multiple Vitamin (MULTIVITAMIN) capsule Take 1 capsule by mouth daily.   ondansetron (ZOFRAN-ODT) 4 MG disintegrating tablet Take 1 tablet (4 mg total) by mouth every 8 (eight) hours as needed for nausea or vomiting.   polyvinyl alcohol (LIQUIFILM TEARS) 1.4 % ophthalmic solution Place 1 drop into  both eyes as needed for dry eyes.   Saline (NASOGEL) GEL Place 1 application  into the nose 2 (two) times daily as needed.   spironolactone (ALDACTONE) 25 MG tablet Take 12.5 mg by mouth daily.   No facility-administered encounter medications on file as of 08/16/2023.    Allergies (verified) Patient has no known allergies.   History: Past Medical History:  Diagnosis Date   AICD (automatic cardioverter/defibrillator) present    [employer] Jude   Anemia aprox. may-1-24   Arthritis    knees,  shoulders, elbows   Bladder cancer [facility])    urologist-  dr Mena Goes   CHF (congestive heart failure) (HCC)    Chronic kidney disease    left kidney removed   Diverticulosis of colon    Fibromyalgia    GERD (gastroesophageal reflux disease)    History of diverticulitis of colon    History of gastric ulcer    due to aleve   Hypertension    Lower urinary tract symptoms (LUTS)    Myocardial infarction (HCC) may 1-24   OSA (obstructive sleep apnea)    Wears CPAP nightly   Oxygen deficiency    PONV (postoperative nausea and vomiting)    Psoriasis    Sepsis (HCC)    january 2021   Sleep apnea    Thyroid disease    biopsy on 10-08-22   Tinnitus    right ear more, has tranmitter in right ear removable at hs   Ulcer    Wears glasses    Wears partial dentures    Past Surgical History:  Procedure Laterality Date   APPENDECTOMY     BIV ICD INSERTION CRT-D N/A 03/01/2023   Procedure: BIV ICD INSERTION CRT-D;  Surgeon: Maurice Small, MD;  [facility];  Service: Cardiovascular;  Laterality: N/A;   COLECTOMY W/ COLOSTOMY  1996   W/   APPENDECTOMY   COLONOSCOPY N/A 05/11/2018   Procedure: COLONOSCOPY;  Surgeon: Corbin Ade, MD;  [facility];  Service: Endoscopy;  Laterality: N/A;  9:30   COLOSTOMY TAKEDOWN  1996   CYSTOSCOPY WITH BIOPSY N/A 12/05/2013   Procedure: CYSTO BLADDER BIOPSY AND FULGERATION;  Surgeon: Jerilee Field, MD;  Location: [facility];  Service: Urology;  Laterality: N/A;   CYSTOSCOPY WITH BIOPSY Bilateral 11/13/2014   Procedure: CYSTOSCOPY WITH  BLADDER BIOPSY FULGERATION AND BILATERAL RETROGRADE PYELOGRAMS;  Surgeon: Jerilee Field, MD;  Location: [facility];  Service: Urology;  Laterality: Bilateral;   CYSTOSCOPY WITH FULGERATION N/A 01/18/2018   Procedure: CYSTOSCOPY WITH FULGERATION/ BLADDER BIOPSY;  Surgeon: Jerilee Field, MD;  Location: [facility];  Service: Urology;   Laterality: N/A;   CYSTOSCOPY WITH INJECTION N/A 11/09/2018   Procedure: CYSTOSCOPY WITH INJECTION OF INDOCYANINE GREEN DYE;  Surgeon: Sebastian Ache, MD;  Location: [facility];  Service: Urology;  Laterality: N/A;   CYSTOSCOPY WITH INSERTION OF UROLIFT N/A 01/18/2018   Procedure: CYSTOSCOPY WITH INSERTION OF UROLIFT;  Surgeon: Jerilee Field, MD;  Location: [facility];  Service: Urology;  Laterality: N/A;   CYSTOSCOPY/URETEROSCOPY/HOLMIUM LASER Left 02/17/2019   Procedure: URETEROSCOPY WITH BALLOON DILATION  AND NEPHROSTOGRAM;  Surgeon: Sebastian Ache, MD;  Location: [facility];  Service: Urology;  Laterality: Left;  1 HR   EXCISION RIGHT UPPER ARM LIPOMA  2005   FRACTURE SURGERY     HEMORROIDECTOMY     INGUINAL HERNIA REPAIR Left 1984   IR NEPHROSTOMY PLACEMENT LEFT  01/05/2019  JOINT REPLACEMENT     both  knees, and both shoulders joints replaced.   KNEE ARTHROSCOPY Left X3  LAST ONE  2002   ORIF LEFT HUMEROUS FX  1976   POLYPECTOMY  05/11/2018   Procedure: POLYPECTOMY;  Surgeon: Corbin Ade, MD;  [facility];  Service: Endoscopy;;   PROSTATE SURGERY     RIGHT HEART CATH AND CORONARY ANGIOGRAPHY N/A 10/09/2022   Procedure: RIGHT HEART CATH AND CORONARY ANGIOGRAPHY;  Surgeon: [name], MD;  [facility];  Service: Cardiovascular;  Laterality: N/A;   ROBOT ASSISTED LAPAROSCOPIC NEPHRECTOMY Left 07/14/2019   Procedure: XI ROBOTIC ASSISTED LAPAROSCOPIC RETROPERITONEAL NEPHRECTOMY;  Surgeon: Sebastian Ache, MD;  Location: [facility];  Service: Urology;  Laterality: Left;  3 HRS   SMALL INTESTINE SURGERY     THYROIDECTOMY Bilateral 04/14/2023   Procedure: TOTAL THYROIDECTOMY;  Surgeon: [name], MD;  [facility];  Service: ENT;  Laterality: Bilateral;   TONSILLECTOMY  AS CHILD   TOTAL KNEE ARTHROPLASTY Left 2006   REVISION 2007  (AFTER I & D WITH ANTIBIOTIC SPACER PROCEDURE FOR STEPH INFECTION)   TOTAL KNEE  ARTHROPLASTY Right 03/30/2016   Procedure: RIGHT TOTAL KNEE ARTHROPLASTY;  Surgeon: Durene Romans, MD;  Location: [facility];  Service: Orthopedics;  Laterality: Right;   TOTAL SHOULDER ARTHROPLASTY Right 04/06/2013   Procedure: RIGHT TOTAL SHOULDER ARTHROPLASTY;  Surgeon: Senaida Lange, MD;  [facility];  Service: Orthopedics;  Laterality: Right;   TOTAL SHOULDER ARTHROPLASTY Left 07/20/2013   Procedure: LEFT TOTAL SHOULDER ARTHROPLASTY;  Surgeon: Senaida Lange, MD;  Location[facility];  Service: Orthopedics;  Laterality: Left;   TRANSURETHRAL RESECTION OF BLADDER TUMOR N/A 11/12/2015   Procedure: TRANSURETHRAL RESECTION OF BLADDER TUMOR (TURBT);  Surgeon: Jerilee Field, MD;  Location: [facility];  Service: Urology;  Laterality: N/A;   TRANSURETHRAL RESECTION OF BLADDER TUMOR WITH GYRUS (TURBT-GYRUS) N/A 12/05/2013   Procedure: TRANSURETHRAL RESECTION OF BLADDER TUMOR WITH GYRUS (TURBT-GYRUS);  Surgeon: Jerilee Field, MD;  Location: [facility];  Service: Urology;  Laterality: N/A;   Family History  Problem Relation Age of Onset   Alzheimer's disease Mother    Heart attack Father    Heart disease Father    Arthritis Father    Hearing loss Father    Hypertension Father    Irregular heart beat Brother        DEFIB.   Congestive Heart Failure Brother    Heart disease Brother    Diabetes Daughter    Sleep apnea Neg Hx    Social History   Socioeconomic History   Marital status: Married    Spouse name: Peggy   Number of children: 2   Years of education: 14   Highest education level: Associate degree: academic program  Occupational History   Occupation: retired    Comment: [employer], 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   Tobacco Use   Smoking status: Former    Current packs/day: 0.00    Average packs/day: 2.0 packs/day for 40.0 years (80.0 ttl pk-yrs)    Types: Cigarettes    Start date: 06/01/1957    Quit date: 06/01/1997    Years since quitting: 26.2   Smokeless  tobacco: Never  Vaping Use   Vaping status: Never Used  Substance and Sexual Activity   Alcohol use: No   Drug use: No   Sexual activity: Not Currently    Comment: vesectomy  Other Topics Concern   Not on file  Social History Narrative   One level home with wife    [id number] - 38 year old MIL lives with them   Caffiene 2 cups daily, tea occas.   Retired, Visual merchandiser   Social Drivers of Corporate investment banker Strain: Low Risk  (08/16/2023)   Overall Financial Resource Strain (CARDIA)    Difficulty of Paying Living Expenses: Not very hard  Recent Concern: Financial Resource Strain - Medium Risk (07/05/2023)   Overall Financial Resource Strain (CARDIA)    Difficulty of Paying Living Expenses: Somewhat hard  Food Insecurity: No Food Insecurity (08/16/2023)   Hunger Vital Sign    Worried About Running Out of Food in the Last Year: Never true    Ran Out of Food in the Last Year: Never true  Transportation Needs: No Transportation Needs (08/16/2023)   PRAPARE - Administrator, Civil Service (Medical): No    Lack of Transportation (Non-Medical): No  Physical Activity: Insufficiently Active (08/16/2023)   Exercise Vital Sign    Days of Exercise per Week: 3 days    Minutes of Exercise per Session: 10 min  Stress: No Stress Concern Present (08/16/2023)   [employer] of Occupational Health - Occupational Stress Questionnaire    Feeling of Stress : Not at all  Social Connections: Socially Integrated (08/16/2023)   Social Connection and Isolation Panel [NHANES]    Frequency of Communication with Friends and Family: More than three times a week    Frequency of Social Gatherings with Friends and Family: Three times a week    Attends Religious Services: More than 4 times per year    Active Member of Clubs or Organizations: Yes    Attends Engineer, structural: More than 4 times per year    Marital Status: Married    Tobacco Counseling Counseling given: Not  Answered    Clinical Intake:  Pre-visit preparation completed: Yes  Pain : No/denies pain     Diabetes: No  How often do you need to have someone help you when you read instructions, pamphlets, or other written materials from your doctor or pharmacy?: 1 - Never  Interpreter Needed?: No  Information entered by :: Kandis Fantasia LPN   Activities of Daily Living     08/11/2023    9:29 AM 05/13/2023    7:22 PM  In your present state of health, do you have any difficulty performing the following activities:  Hearing? 1 0  Vision? 0 0  Difficulty concentrating or making decisions? 1 0  Walking or climbing stairs? 1   Dressing or bathing? 1   Preparing Food and eating ? N   Using the Toilet? N   In the past six months, have you accidently leaked urine? N   Do you have problems with loss of bowel control? N   Managing your Medications? N   Managing your Finances? Y   Housekeeping or managing your Housekeeping? Y     Patient Care Team: Raliegh Ip, DO as PCP - General (Family Medicine) Mealor, Roberts Gaudy, MD as PCP - Electrophysiology (Cardiology) Little Ishikawa, MD as PCP - Cardiology (Cardiology) Berneice Heinrich Delbert Phenix., MD as Consulting Physician (Urology) Elesa Hacker, MD as Referring Physician (Dermatology) Moody Bruins as Physician Assistant (Chiropractic Medicine)  Indicate any recent Medical Services you may have received from other than [facility] providers in the past year (date may be approximate).     Assessment:   This is a routine wellness examination  for [place].  Hearing/Vision screen Hearing Screening - Comments:: Denies hearing difficulties   Vision Screening - Comments:: Wears rx glasses - up to date with routine eye exams with [facility] [place]    Goals Addressed   None    Depression Screen     08/16/2023    4:07 PM 05/17/2023   12:01 PM 03/16/2023    9:34 AM 01/22/2023    9:31 AM 11/05/2022    8:39 AM 10/20/2022     9:44 AM 09/16/2022    9:06 AM  PHQ 2/9 Scores  PHQ - 2 Score 1 1 0 0 0 0 0  PHQ- 9 Score  2 0 0 2 0 0    Fall Risk     08/16/2023    4:10 PM 08/11/2023    9:29 AM 05/17/2023   12:02 PM 03/16/2023    9:34 AM 01/22/2023    9:30 AM  Fall Risk   Falls in the past year? 0 0 0 0 0  Number falls in past yr: 0 0 0 0 0  Injury with Fall? 0 0 0 0 0  Risk for fall due to : No Fall Risks No Fall Risks No Fall Risks  No Fall Risks  Follow up Falls prevention discussed;Education provided;Falls evaluation completed Falls prevention discussed;Education provided;Falls evaluation completed Falls evaluation completed Education provided Education provided    MEDICARE RISK AT HOME:  Medicare Risk at Home Any stairs in or around the home?: (Patient-Rptd) No If so, are there any without handrails?: (Patient-Rptd) No Home free of loose throw rugs in walkways, pet beds, electrical cords, etc?: (Patient-Rptd) Yes Adequate lighting in your home to reduce risk of falls?: (Patient-Rptd) Yes Life alert?: No Use of a cane, walker or w/c?: No Grab bars in the bathroom?: (Patient-Rptd) Yes Shower chair or bench in shower?: (Patient-Rptd) Yes Elevated toilet seat or a handicapped toilet?: No  TIMED UP AND GO:  Was the test performed?  No  Cognitive Function: 6CIT completed    10/21/2020   10:28 AM 12/27/2017    9:37 AM  MMSE - Mini Mental State Exam  Orientation to time 5 5  Orientation to Place 5 5  Registration 3 3  Attention/ Calculation 5 5  Recall 3 3  Language- name 2 objects 2 2  Language- repeat 1 1  Language- follow 3 step command 3 3  Language- read & follow direction 1 1  Write a sentence 1 1  Copy design 1 1  Total score 30 30        08/16/2023    3:31 PM 08/14/2022    9:21 AM 08/12/2021    9:26 AM 08/06/2020    9:08 AM 12/29/2018   11:42 AM  6CIT Screen  What Year? 0 points 0 points 0 points 0 points 0 points  What month? 0 points 0 points 0 points 0 points 0 points  What time? 0  points 0 points 0 points 0 points 0 points  Count back from 20 0 points 0 points 0 points 0 points 0 points  Months in reverse 0 points 0 points 0 points 0 points 0 points  Repeat phrase 0 points 0 points 0 points 0 points 0 points  Total Score 0 points 0 points 0 points 0 points 0 points    Immunizations Immunization History  Administered Date(s) Administered   Fluad Quad(high Dose 65+) 02/08/2019, 04/10/2020, 02/16/2022   Fluad Trivalent(High Dose 65+) 04/05/2023   Influenza, High Dose Seasonal PF 04/15/2017, 04/16/2021  Influenza, Seasonal, Injecte, Preservative Fre 02/28/2009   Influenza,inj,Quad PF,6+ Mos 02/25/2016, 03/01/2018   Moderna Sars-Covid-2 Vaccination 11/14/2019, 12/12/2019   Pneumococcal Conjugate-13 08/21/2019   Pneumococcal Polysaccharide-23 12/27/2017   Td (Adult),5 Lf Tetanus Toxid, Preservative Free 08/15/2020   Tdap 08/15/2020   Zoster Recombinant(Shingrix) 09/19/2021, 04/21/2022    Screening Tests Health Maintenance  Topic Date Due   COVID-19 Vaccine (3 - Moderna risk series) 01/09/2020   Medicare Annual Wellness (AWV)  08/15/2024   Colonoscopy  05/11/2028   DTaP/Tdap/Td (2 - Td or Tdap) 08/16/2030   Pneumonia Vaccine 81+ Years old  Completed   INFLUENZA VACCINE  Completed   Hepatitis C Screening  Completed   Zoster Vaccines- Shingrix  Completed   HPV VACCINES  Aged Out    Health Maintenance  Health Maintenance Due  Topic Date Due   COVID-19 Vaccine (3 - Moderna risk series) 01/09/2020    Additional Screening:  Vision Screening: Recommended annual ophthalmology exams for early detection of glaucoma and other disorders of the eye.  Dental Screening: Recommended annual dental exams for proper oral hygiene  Community Resource Referral / Chronic Care Management: CRR required this visit?  No   CCM required this visit?  No     Plan:     I have personally reviewed and noted the following in the patient's chart:   Medical and social  history Use of alcohol, tobacco or illicit drugs  Current medications and supplements including opioid prescriptions. Patient is not currently taking opioid prescriptions. Functional ability and status Nutritional status Physical activity Advanced directives List of other physicians Hospitalizations, surgeries, and ER visits in previous 12 months Vitals Screenings to include cognitive, depression, and falls Referrals and appointments  In addition, I have reviewed and discussed with patient certain preventive protocols, quality metrics, and best practice recommendations. A written personalized care plan for preventive services as well as general preventive health recommendations were provided to patient.     Kandis Fantasia [place], [place]   09/07/8117   After Visit Summary: (MyChart) Due to this being a telephonic visit, the after visit summary with patients personalized plan was offered to patient via MyChart   Notes:  See telephone note with patient concern

## 2023-08-16 NOTE — Patient Instructions (Signed)
 Billy Rasmussen , Thank you for taking time to come for your Medicare Wellness Visit. I appreciate your ongoing commitment to your health goals. Please review the following plan we discussed and let me know if I can assist you in the future.   Referrals/Orders/Follow-Ups/Clinician Recommendations: Aim for 30 minutes of exercise or brisk walking, 6-8 glasses of water, and 5 servings of fruits and vegetables each day.  This is a list of the screening recommended for you and due dates:  Health Maintenance  Topic Date Due   COVID-19 Vaccine (3 - Moderna risk series) 01/09/2020   Medicare Annual Wellness Visit  08/15/2024   Colon Cancer Screening  05/11/2028   DTaP/Tdap/Td vaccine (2 - Td or Tdap) 08/16/2030   Pneumonia Vaccine  Completed   Flu Shot  Completed   Hepatitis C Screening  Completed   Zoster (Shingles) Vaccine  Completed   HPV Vaccine  Aged Out    Advanced directives: (ACP Link)Information on Advanced Care Planning can be found at [facility] Advance Health Care Directives Advance Health Care Directives. [email or web address]   Next Medicare Annual Wellness Visit scheduled for next year: Yes

## 2023-08-16 NOTE — Telephone Encounter (Signed)
Pt. Needs to be seen for this. Thanks, WS 

## 2023-08-17 NOTE — Telephone Encounter (Signed)
 Appt scheduled for 08/18/2023 with Billy Rasmussen

## 2023-08-18 ENCOUNTER — Encounter: Payer: Self-pay | Admitting: Nurse Practitioner

## 2023-08-18 ENCOUNTER — Ambulatory Visit (INDEPENDENT_AMBULATORY_CARE_PROVIDER_SITE_OTHER): Admitting: Nurse Practitioner

## 2023-08-18 VITALS — BP 109/71 | HR 106 | Temp 97.2°F | Ht 66.0 in | Wt 219.2 lb

## 2023-08-18 DIAGNOSIS — R058 Other specified cough: Secondary | ICD-10-CM | POA: Insufficient documentation

## 2023-08-18 MED ORDER — AZITHROMYCIN 250 MG PO TABS: ORAL_TABLET | ORAL | 0 refills | Status: DC

## 2023-08-18 MED ORDER — AZELASTINE HCL 0.1 % NA SOLN: 1.0000 | Freq: Two times a day (BID) | NASAL | 12 refills | Status: DC

## 2023-08-18 NOTE — Progress Notes (Signed)
 Acute Office Visit  Subjective:     Patient ID: Billy Rasmussen, male    DOB: 1945/08/28, 78 y.o.   MRN: [id number]  Chief Complaint  Patient presents with   Cough    Symptoms for couple weeks   Sore Throat   Nasal Congestion    Cough Associated symptoms include a sore throat. Pertinent negatives include no chills, fever, headaches or rash.  Sore Throat  Associated symptoms include congestion and coughing. Pertinent negatives include no diarrhea, headaches or vomiting.   Billy Rasmussen is a 78 yrs old male present 08/18/2023 for an acute visit  concerns for URI symptoms. A 78 year old male, non-smoker, with no recent travel history, presents for an acute visit due to cough, congestion, and a runny nose for the past two weeks. He reports dark-colored sputum and night sweats. He has not taken any over-the-counter (OTC) medications for symptom relief. Denies fever, shortness of breath, weight loss, or chest pain. No known sick contacts. Active Ambulatory Problems    Diagnosis Date Noted   Shoulder arthritis 04/07/2013   S/P shoulder replacement 07/20/2013   Obesity (BMI 30-39.9) 01/07/2016   Essential hypertension 02/25/2016   S/P right TKA 03/30/2016   S/P knee replacement 03/30/2016   Obese 03/31/2016   Lipoma 09/30/2016   Pure hypercholesterolemia 10/02/2016   Malignant neoplasm of urinary bladder (HCC) 10/02/2016   BCG cystitis 02/02/2018   Bladder cancer (HCC) 11/09/2018   AKI (acute kidney injury) (HCC) 12/22/2018   Gastroesophageal reflux disease    Abnormal renal function 01/20/2019   OSA (obstructive sleep apnea) 05/12/2019   UTI (urinary tract infection) 06/25/2019   Nonfunctioning kidney 07/14/2019   Renal mass 07/15/2019   S/P radical cystoprostatectomy 12/28/2019   S/p nephrectomy 12/28/2019   Lumbar foraminal stenosis 03/21/2020   Aneurysm of ascending aorta without rupture (HCC) 09/02/2022   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   Multiple thyroid nodules  09/14/2022   Hypotension 10/06/2022   Hyponatremia 10/07/2022   Thrombocytopenia (HCC) 10/07/2022   Elevated troponin 10/07/2022   Acquired hypothyroidism 10/07/2022   Acute combined systolic and diastolic heart failure (HCC) 10/08/2022   Acute on chronic systolic CHF (congestive heart failure) (HCC) 10/08/2022   HFrEF (heart failure with reduced ejection fraction) (HCC) 10/10/2022   Dysuria 11/20/2017   S/P total thyroidectomy 04/14/2023   Chest pain 05/13/2023   Hospital discharge follow-up 05/17/2023   Pericarditis 05/17/2023   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   Bleeding from the nose 07/06/2023   Ageusia 07/06/2023   Respiratory tract congestion with cough 08/18/2023   Resolved Ambulatory Problems    Diagnosis Date Noted   CELLULITIS, KNEE, LEFT 08/26/2006   Healthcare maintenance 02/25/2016   Acute cystitis with hematuria 10/02/2016   Sepsis (HCC) 12/22/2018   Urinary tract infection without hematuria    Acute pyelonephritis    Enterococcus UTI    Sepsis due to urinary tract infection (HCC) 02/17/2019   Fever 04/09/2019   SIRS (systemic inflammatory response syndrome) (HCC) 04/09/2019   Bacteremia due to Klebsiella pneumoniae 12/28/2019   ESBL (extended spectrum beta-lactamase) producing bacteria infection 03/21/2020   Past Medical History:  Diagnosis Date   AICD (automatic cardioverter/defibrillator) present    Anemia aprox. may-1-24   Arthritis    CHF (congestive heart failure) (HCC)    Chronic kidney disease    Diverticulosis of colon    Fibromyalgia    GERD (gastroesophageal reflux disease)    History of diverticulitis of colon    History of gastric ulcer  Hypertension    Lower urinary tract symptoms (LUTS)    Myocardial infarction [facility]) may 1-24   Oxygen deficiency    PONV (postoperative nausea and vomiting)    Psoriasis    Sleep apnea    Thyroid disease    Tinnitus    Ulcer    Wears glasses    Wears partial dentures     Review of Systems   Constitutional:  Negative for chills and fever.  HENT:  Positive for congestion and sore throat.   Respiratory:  Positive for cough and sputum production.        Dark color  Gastrointestinal:  Negative for constipation, diarrhea, nausea and vomiting.  Skin:  Negative for itching and rash.  Neurological:  Negative for dizziness and headaches.   Negative unless indicated in HPI    Objective:    BP 109/71   Pulse (!) 106   Temp (!) 97.2 F (36.2 C) (Temporal)   Ht 5\' 6"  (1.676 m)   Wt 219 lb 3.2 oz (99.4 kg)   SpO2 96%   BMI 35.38 kg/m  BP Readings from Last 3 Encounters:  08/18/23 109/71  06/28/23 118/68  06/21/23 114/62   Wt Readings from Last 3 Encounters:  08/18/23 219 lb 3.2 oz (99.4 kg)  08/16/23 213 lb (96.6 kg)  06/28/23 213 lb (96.6 kg)      Physical Exam Vitals and nursing note reviewed.  Constitutional:      General: He is not in acute distress. HENT:     Head: Normocephalic.     Right Ear: Tympanic membrane, ear canal and external ear normal. There is no impacted cerumen.     Left Ear: Tympanic membrane and ear canal normal. There is no impacted cerumen.     Nose: Nose normal.     Mouth/Throat:     Mouth: Mucous membranes are moist.  Eyes:     General: No scleral icterus.    Extraocular Movements: Extraocular movements intact.     Conjunctiva/sclera: Conjunctivae normal.     Pupils: Pupils are equal, round, and reactive to light.  Cardiovascular:     Heart sounds: Normal heart sounds.  Pulmonary:     Effort: Pulmonary effort is normal.     Breath sounds: Normal breath sounds.  Musculoskeletal:        General: Normal range of motion.     Right lower leg: No edema.     Left lower leg: No edema.  Skin:    General: Skin is warm and dry.     Findings: No rash.  Neurological:     Mental Status: He is alert and oriented to person, place, and time.  Psychiatric:        Mood and Affect: Mood normal.        Behavior: Behavior normal.        Thought  Content: Thought content normal.        Judgment: Judgment normal.     No results found for any visits on 08/18/23.      Assessment & Plan:  Respiratory tract congestion with cough -     Azithromycin; As directed  Dispense: 6 tablet; Refill: 0 -     Azelastine HCl; Place 1 spray into both nostrils 2 (two) times daily. Use in each nostril as directed  Dispense: 30 mL; Refill: 12  Billy Rasmussen is 15 yrs Caucasian male seen today for URI, no acute distress URI with congestion: Zithromax # 6 pill dispense, Astelin Bid Increase hydration  It appears that you have a viral upper respiratory infection (cold).  Cold symptoms can last up to 2 weeks.  I recommend that you only use cold medications that are safe in high blood pressure like Coricidin (generic is fine).  Other cold medications can increase your blood pressure.    - Get plenty of rest and drink plenty of fluids. - Try to breathe moist air. Use a cold mist humidifier. - Consume warm fluids (soup or tea) to provide relief for a stuffy nose and to loosen phlegm. - For nasal stuffiness, try saline nasal spray or a Neti Pot.  Afrin nasal spray can also be used but this product should not be used longer than 3 days or it will cause rebound nasal stuffiness (worsening nasal congestion). - For sore throat pain relief: use chloraseptic spray, suck on throat lozenges, hard candy or popsicles; gargle with warm salt water (1/4 tsp. salt per 8 oz. of water); and eat soft, bland foods. - Eat a well-balanced diet. If you cannot, ensure you are getting enough nutrients by taking a daily multivitamin. - Avoid dairy products, as they can thicken phlegm. - Avoid alcohol, as it impairs your body's immune system.  CONTACT YOUR DOCTOR IF YOU EXPERIENCE ANY OF THE FOLLOWING: - High fever - Ear pain - Sinus-type headache - Unusually severe cold symptoms - Cough that gets worse while other cold symptoms improve - Flare up of any chronic lung problem, such  as asthma - Your symptoms persist longer than 2 weeks  Encourage healthy lifestyle choices, including diet (rich in fruits, vegetables, and lean proteins, and low in salt and simple carbohydrates) and exercise (at least 30 minutes of moderate physical activity daily).     The above assessment and management plan was discussed with the patient. The patient verbalized understanding of and has agreed to the management plan. Patient is aware to call the clinic if they develop any new symptoms or if symptoms persist or worsen. Patient is aware when to return to the clinic for a follow-up visit. Patient educated on when it is appropriate to go to the emergency department.  Return if symptoms worsen or fail to improve.  Billy Rasmussen, [facility] [address] [place], [place] [address] [phone]  Note: This document was prepared by [employer] voice dictation technology and any errors that results from this process are unintentional.

## 2023-08-21 ENCOUNTER — Emergency Department (HOSPITAL_COMMUNITY)

## 2023-08-21 ENCOUNTER — Inpatient Hospital Stay (HOSPITAL_COMMUNITY)
Admission: EM | Admit: 2023-08-21 | Discharge: 2023-08-26 | DRG: 866 | Disposition: A | Discharge status: Home / Self Care | Attending: Family Medicine | Admitting: Family Medicine

## 2023-08-21 ENCOUNTER — Other Ambulatory Visit: Payer: Self-pay

## 2023-08-21 DIAGNOSIS — Z95810 Presence of automatic (implantable) cardiac defibrillator: Secondary | ICD-10-CM | POA: Diagnosis not present

## 2023-08-21 DIAGNOSIS — I2510 Atherosclerotic heart disease of native coronary artery without angina pectoris: Secondary | ICD-10-CM | POA: Diagnosis present

## 2023-08-21 DIAGNOSIS — I7121 Aneurysm of the ascending aorta, without rupture: Secondary | ICD-10-CM | POA: Diagnosis present

## 2023-08-21 DIAGNOSIS — Z79899 Other long term (current) drug therapy: Secondary | ICD-10-CM

## 2023-08-21 DIAGNOSIS — Z66 Do not resuscitate: Secondary | ICD-10-CM | POA: Diagnosis present

## 2023-08-21 DIAGNOSIS — N1831 Chronic kidney disease, stage 3a: Secondary | ICD-10-CM | POA: Diagnosis present

## 2023-08-21 DIAGNOSIS — K219 Gastro-esophageal reflux disease without esophagitis: Secondary | ICD-10-CM | POA: Diagnosis present

## 2023-08-21 DIAGNOSIS — R8281 Pyuria: Secondary | ICD-10-CM | POA: Diagnosis present

## 2023-08-21 DIAGNOSIS — Z905 Acquired absence of kidney: Secondary | ICD-10-CM | POA: Diagnosis not present

## 2023-08-21 DIAGNOSIS — N3000 Acute cystitis without hematuria: Secondary | ICD-10-CM | POA: Diagnosis not present

## 2023-08-21 DIAGNOSIS — I5042 Chronic combined systolic (congestive) and diastolic (congestive) heart failure: Secondary | ICD-10-CM | POA: Diagnosis present

## 2023-08-21 DIAGNOSIS — I130 Hypertensive heart and chronic kidney disease with heart failure and stage 1 through stage 4 chronic kidney disease, or unspecified chronic kidney disease: Secondary | ICD-10-CM | POA: Diagnosis present

## 2023-08-21 DIAGNOSIS — Z8249 Family history of ischemic heart disease and other diseases of the circulatory system: Secondary | ICD-10-CM

## 2023-08-21 DIAGNOSIS — Z1152 Encounter for screening for COVID-19: Secondary | ICD-10-CM

## 2023-08-21 DIAGNOSIS — E890 Postprocedural hypothyroidism: Secondary | ICD-10-CM | POA: Diagnosis present

## 2023-08-21 DIAGNOSIS — G4733 Obstructive sleep apnea (adult) (pediatric): Secondary | ICD-10-CM | POA: Diagnosis present

## 2023-08-21 DIAGNOSIS — D61818 Other pancytopenia: Secondary | ICD-10-CM | POA: Diagnosis not present

## 2023-08-21 DIAGNOSIS — I252 Old myocardial infarction: Secondary | ICD-10-CM

## 2023-08-21 DIAGNOSIS — Z833 Family history of diabetes mellitus: Secondary | ICD-10-CM

## 2023-08-21 DIAGNOSIS — E039 Hypothyroidism, unspecified: Secondary | ICD-10-CM | POA: Diagnosis not present

## 2023-08-21 DIAGNOSIS — Z87891 Personal history of nicotine dependence: Secondary | ICD-10-CM

## 2023-08-21 DIAGNOSIS — D72819 Decreased white blood cell count, unspecified: Secondary | ICD-10-CM | POA: Diagnosis not present

## 2023-08-21 DIAGNOSIS — Z96612 Presence of left artificial shoulder joint: Secondary | ICD-10-CM | POA: Diagnosis present

## 2023-08-21 DIAGNOSIS — I10 Essential (primary) hypertension: Secondary | ICD-10-CM | POA: Diagnosis not present

## 2023-08-21 DIAGNOSIS — A938 Other specified arthropod-borne viral fevers: Secondary | ICD-10-CM | POA: Diagnosis present

## 2023-08-21 DIAGNOSIS — R319 Hematuria, unspecified: Secondary | ICD-10-CM

## 2023-08-21 DIAGNOSIS — Z79890 Hormone replacement therapy (postmenopausal): Secondary | ICD-10-CM | POA: Diagnosis not present

## 2023-08-21 DIAGNOSIS — Z820 Family history of epilepsy and other diseases of the nervous system: Secondary | ICD-10-CM

## 2023-08-21 DIAGNOSIS — N390 Urinary tract infection, site not specified: Secondary | ICD-10-CM | POA: Diagnosis not present

## 2023-08-21 DIAGNOSIS — Z8261 Family history of arthritis: Secondary | ICD-10-CM

## 2023-08-21 DIAGNOSIS — D696 Thrombocytopenia, unspecified: Secondary | ICD-10-CM | POA: Diagnosis present

## 2023-08-21 DIAGNOSIS — E222 Syndrome of inappropriate secretion of antidiuretic hormone: Secondary | ICD-10-CM | POA: Diagnosis present

## 2023-08-21 DIAGNOSIS — Z822 Family history of deafness and hearing loss: Secondary | ICD-10-CM

## 2023-08-21 DIAGNOSIS — Z96653 Presence of artificial knee joint, bilateral: Secondary | ICD-10-CM | POA: Diagnosis present

## 2023-08-21 DIAGNOSIS — C679 Malignant neoplasm of bladder, unspecified: Secondary | ICD-10-CM | POA: Diagnosis present

## 2023-08-21 DIAGNOSIS — N3001 Acute cystitis with hematuria: Secondary | ICD-10-CM | POA: Diagnosis not present

## 2023-08-21 DIAGNOSIS — M797 Fibromyalgia: Secondary | ICD-10-CM | POA: Diagnosis present

## 2023-08-21 DIAGNOSIS — Z8551 Personal history of malignant neoplasm of bladder: Secondary | ICD-10-CM

## 2023-08-21 DIAGNOSIS — Z85850 Personal history of malignant neoplasm of thyroid: Secondary | ICD-10-CM

## 2023-08-21 DIAGNOSIS — E782 Mixed hyperlipidemia: Secondary | ICD-10-CM | POA: Insufficient documentation

## 2023-08-21 DIAGNOSIS — R509 Fever, unspecified: Secondary | ICD-10-CM | POA: Diagnosis present

## 2023-08-21 DIAGNOSIS — Z96611 Presence of right artificial shoulder joint: Secondary | ICD-10-CM | POA: Diagnosis present

## 2023-08-21 LAB — RESP PANEL BY RT-PCR (RSV, FLU A&B, COVID)  RVPGX2
Influenza A by PCR: NEGATIVE
Influenza B by PCR: NEGATIVE
Resp Syncytial Virus by PCR: NEGATIVE
SARS Coronavirus 2 by RT PCR: NEGATIVE

## 2023-08-21 LAB — URINALYSIS, ROUTINE W REFLEX MICROSCOPIC
Bilirubin Urine: NEGATIVE
Glucose, UA: NEGATIVE mg/dL
Ketones, ur: NEGATIVE mg/dL
Nitrite: NEGATIVE
Protein, ur: 30 mg/dL — AB
Specific Gravity, Urine: 1.016 (ref 1.005–1.030)
pH: 6 (ref 5.0–8.0)

## 2023-08-21 LAB — CBC WITH DIFFERENTIAL/PLATELET
Abs Immature Granulocytes: 0.03 10*3/uL (ref 0.00–0.07)
Basophils Absolute: 0 10*3/uL (ref 0.0–0.1)
Basophils Relative: 1 %
Eosinophils Absolute: 0 10*3/uL (ref 0.0–0.5)
Eosinophils Relative: 0 %
HCT: 39 % (ref 39.0–52.0)
Hemoglobin: 13.4 g/dL (ref 13.0–17.0)
Immature Granulocytes: 1 %
Lymphocytes Relative: 13 %
Lymphs Abs: 0.4 10*3/uL — ABNORMAL LOW (ref 0.7–4.0)
MCH: 31.7 pg (ref 26.0–34.0)
MCHC: 34.4 g/dL (ref 30.0–36.0)
MCV: 92.2 fL (ref 80.0–100.0)
Monocytes Absolute: 0.5 10*3/uL (ref 0.1–1.0)
Monocytes Relative: 17 %
Neutro Abs: 2.1 10*3/uL (ref 1.7–7.7)
Neutrophils Relative %: 68 %
Platelets: 85 10*3/uL — ABNORMAL LOW (ref 150–400)
RBC: 4.23 MIL/uL (ref 4.22–5.81)
RDW: 17 % — ABNORMAL HIGH (ref 11.5–15.5)
WBC: 3 10*3/uL — ABNORMAL LOW (ref 4.0–10.5)
nRBC: 0 % (ref 0.0–0.2)

## 2023-08-21 LAB — COMPREHENSIVE METABOLIC PANEL
ALT: 31 U/L (ref 0–44)
AST: 57 U/L — ABNORMAL HIGH (ref 15–41)
Albumin: 4 g/dL (ref 3.5–5.0)
Alkaline Phosphatase: 60 U/L (ref 38–126)
Anion gap: 12 (ref 5–15)
BUN: 25 mg/dL — ABNORMAL HIGH (ref 8–23)
CO2: 21 mmol/L — ABNORMAL LOW (ref 22–32)
Calcium: 9.2 mg/dL (ref 8.9–10.3)
Chloride: 99 mmol/L (ref 98–111)
Creatinine, Ser: 1.6 mg/dL — ABNORMAL HIGH (ref 0.61–1.24)
GFR, Estimated: 45 mL/min — ABNORMAL LOW (ref >60)
Glucose, Bld: 112 mg/dL — ABNORMAL HIGH (ref 70–99)
Potassium: 4.4 mmol/L (ref 3.5–5.1)
Sodium: 132 mmol/L — ABNORMAL LOW (ref 135–145)
Total Bilirubin: 1.2 mg/dL (ref 0.0–1.2)
Total Protein: 7.8 g/dL (ref 6.5–8.1)

## 2023-08-21 MED ORDER — SODIUM CHLORIDE 0.9 % IV SOLN
1.0000 g | Freq: Three times a day (TID) | INTRAVENOUS | Status: DC
  Administered 2023-08-21: 1 g via INTRAVENOUS
  Filled 2023-08-21: qty 20

## 2023-08-21 NOTE — ED Triage Notes (Signed)
 Pt c/o cough, congestion, fever, body aches, headache and dizziness x2 days.

## 2023-08-21 NOTE — ED Provider Notes (Signed)
 [place] EMERGENCY DEPARTMENT AT [facility] Provider Note  CSN: [id number] Arrival date & time: 08/21/23 2032  Chief Complaint(s) No chief complaint on file.  HPI Billy Rasmussen is a 78 y.o. male with PMH bladder cancer status post cystectomy with urostomy in place, CHF, previous history of ESBL Klebsiella UTIs requiring IV antibiotics, CAD status post MI who presents emergency room for evaluation of fever, chills, joint pain and nausea.  States that symptoms have been worse over the last 2 days with Tmax 102.2 at home.  States that this does feel similar to his previous episodes of multidrug-resistant UTI.  Denies vomiting but endorses persistent nausea.  Denies chest pain, shortness of breath, abdominal pain, headache or other systemic symptoms.   Past Medical History Past Medical History:  Diagnosis Date   AICD (automatic cardioverter/defibrillator) present    Abbott/St Jude   Anemia aprox. may-1-24   Arthritis    knees, shoulders, elbows   Bladder cancer [facility])    urologist-  dr Mena Goes   CHF (congestive heart failure) (HCC)    Chronic kidney disease    left kidney removed   Diverticulosis of colon    Fibromyalgia    GERD (gastroesophageal reflux disease)    History of diverticulitis of colon    History of gastric ulcer    due to aleve   Hypertension    Lower urinary tract symptoms (LUTS)    Myocardial infarction (HCC) may 1-24   OSA (obstructive sleep apnea)    Wears CPAP nightly   Oxygen deficiency    PONV (postoperative nausea and vomiting)    Psoriasis    Sepsis (HCC)    january 2021   Sleep apnea    Thyroid disease    biopsy on 10-08-22   Tinnitus    right ear more, has tranmitter in right ear removable at hs   Ulcer    Wears glasses    Wears partial dentures    Patient Active Problem List   Diagnosis Date Noted   Respiratory tract congestion with cough 08/18/2023   Bleeding from the nose 07/06/2023   Ageusia 07/06/2023   Aortitis (HCC)  05/19/2023   Hospital discharge follow-up 05/17/2023   Pericarditis 05/17/2023   Chest pain 05/13/2023   S/P total thyroidectomy 04/14/2023   HFrEF (heart failure with reduced ejection fraction) (HCC) 10/10/2022   Acute combined systolic and diastolic heart failure (HCC) 10/08/2022   Acute on chronic systolic CHF (congestive heart failure) (HCC) 10/08/2022   Hyponatremia 10/07/2022   Thrombocytopenia (HCC) 10/07/2022   Elevated troponin 10/07/2022   Acquired hypothyroidism 10/07/2022   Hypotension 10/06/2022   Multiple thyroid nodules 09/14/2022   Aneurysm of ascending aorta without rupture (HCC) 09/02/2022   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   Lumbar foraminal stenosis 03/21/2020   S/P radical cystoprostatectomy 12/28/2019   S/p nephrectomy 12/28/2019   Renal mass 07/15/2019   Nonfunctioning kidney 07/14/2019   UTI (urinary tract infection) 06/25/2019   OSA (obstructive sleep apnea) 05/12/2019   Abnormal renal function 01/20/2019   Gastroesophageal reflux disease    AKI (acute kidney injury) (HCC) 12/22/2018   Bladder cancer (HCC) 11/09/2018   BCG cystitis 02/02/2018   Dysuria 11/20/2017   Pure hypercholesterolemia 10/02/2016   Malignant neoplasm of urinary bladder (HCC) 10/02/2016   Lipoma 09/30/2016   Obese 03/31/2016   S/P right TKA 03/30/2016   S/P knee replacement 03/30/2016   Essential hypertension 02/25/2016   Obesity (BMI 30-39.9) 01/07/2016   S/P shoulder replacement 07/20/2013   Shoulder  arthritis 04/07/2013   Home Medication(s) Prior to Admission medications   Medication Sig Start Date End Date Taking? Authorizing Provider  acetaminophen (TYLENOL) 650 MG CR tablet Take 1,300 mg by mouth daily as needed for pain.    [provider]  Albuterol-Budesonide (AIRSUPRA) 90-80 MCG/ACT AERO Inhale 2 puffs into the lungs every 6 (six) hours as needed (shortness of breath/ wheeze). 03/16/23   Raliegh Ip, DO  atorvastatin (LIPITOR) 10 MG tablet Take 1 tablet  (10 mg total) by mouth daily. 06/28/23 09/26/23  Little Ishikawa, MD  azelastine (ASTELIN) 0.1 % nasal spray Place 1 spray into both nostrils 2 (two) times daily. Use in each nostril as directed 08/18/23   Martina Sinner, NP  azithromycin (ZITHROMAX Z-PAK) 250 MG tablet As directed 08/18/23   Martina Sinner, NP  fluticasone [facility] 50 MCG/ACT nasal spray Place 1 spray into both nostrils daily. 02/03/23   [provider]  furosemide (LASIX) 20 MG tablet Take 1 tablet (20 mg) daily as needed if you gain more than 3 pounds in 1 day or 5 pounds in 1 week. 10/29/22   Allayne Butcher, PA-C  levothyroxine (SYNTHROID) 100 MCG tablet Take 1 tablet (100 mcg total) by mouth daily. 07/30/23   Raliegh Ip, DO  losartan (COZAAR) 25 MG tablet Take 1 tablet (25 mg total) by mouth daily. 07/12/23 10/10/23  Little Ishikawa, MD  metoprolol succinate (TOPROL-XL) 25 MG 24 hr tablet Take 1 tablet (25 mg total) by mouth daily. 12/14/22   Raliegh Ip, DO  Multiple Vitamin (MULTIVITAMIN) capsule Take 1 capsule by mouth daily.    [provider]  ondansetron (ZOFRAN-ODT) 4 MG disintegrating tablet Take 1 tablet (4 mg total) by mouth every 8 (eight) hours as needed for nausea or vomiting. 06/08/23   Raliegh Ip, DO  polyvinyl alcohol (LIQUIFILM TEARS) 1.4 % ophthalmic solution Place 1 drop into both eyes as needed for dry eyes. 05/16/19   Sheth, Devam P, MD  Saline (NASOGEL) GEL Place 1 application  into the nose 2 (two) times daily as needed. 07/06/23   St Vena Austria, NP  spironolactone (ALDACTONE) 25 MG tablet Take 12.5 mg by mouth daily. 05/18/23   [provider]                                                                                                                                    Past Surgical History Past Surgical History:  Procedure Laterality Date   APPENDECTOMY     BIV ICD INSERTION CRT-D N/A 03/01/2023    Procedure: BIV ICD INSERTION CRT-D;  Surgeon: Mealor, Roberts Gaudy, MD;  Location: [facility] INVASIVE CV LAB;  Service: Cardiovascular;  Laterality: N/A;   COLECTOMY W/ COLOSTOMY  1996   W/   APPENDECTOMY   COLONOSCOPY N/A 05/11/2018   Procedure: COLONOSCOPY;  Surgeon: Corbin Ade, MD;  Location: [facility]  ENDO SUITE;  Service: Endoscopy;  Laterality: N/A;  9:30   COLOSTOMY TAKEDOWN  1996   CYSTOSCOPY WITH BIOPSY N/A 12/05/2013   Procedure: CYSTO BLADDER BIOPSY AND FULGERATION;  Surgeon: Jerilee Field, MD;  Location: [facility];  Service: Urology;  Laterality: N/A;   CYSTOSCOPY WITH BIOPSY Bilateral 11/13/2014   Procedure: CYSTOSCOPY WITH  BLADDER BIOPSY FULGERATION AND BILATERAL RETROGRADE PYELOGRAMS;  Surgeon: Jerilee Field, MD;  Location: [facility];  Service: Urology;  Laterality: Bilateral;   CYSTOSCOPY WITH FULGERATION N/A 01/18/2018   Procedure: CYSTOSCOPY WITH FULGERATION/ BLADDER BIOPSY;  Surgeon: Jerilee Field, MD;  Location: [facility];  Service: Urology;  Laterality: N/A;   CYSTOSCOPY WITH INJECTION N/A 11/09/2018   Procedure: CYSTOSCOPY WITH INJECTION OF INDOCYANINE GREEN DYE;  Surgeon: Sebastian Ache, MD;  Location: [facility];  Service: Urology;  Laterality: N/A;   CYSTOSCOPY WITH INSERTION OF UROLIFT N/A 01/18/2018   Procedure: CYSTOSCOPY WITH INSERTION OF UROLIFT;  Surgeon: Jerilee Field, MD;  Location: [facility];  Service: Urology;  Laterality: N/A;   CYSTOSCOPY/URETEROSCOPY/HOLMIUM LASER Left 02/17/2019   Procedure: URETEROSCOPY WITH BALLOON DILATION  AND NEPHROSTOGRAM;  Surgeon: Sebastian Ache, MD;  Location: [facility];  Service: Urology;  Laterality: Left;  1 HR   EXCISION RIGHT UPPER ARM LIPOMA  2005   FRACTURE SURGERY     HEMORROIDECTOMY     INGUINAL HERNIA REPAIR Left 1984   IR NEPHROSTOMY PLACEMENT LEFT  01/05/2019   JOINT REPLACEMENT     both  knees, and both shoulders joints replaced.    KNEE ARTHROSCOPY Left X3  LAST ONE  2002   ORIF LEFT HUMEROUS FX  1976   POLYPECTOMY  05/11/2018   Procedure: POLYPECTOMY;  Surgeon: Corbin Ade, MD;  Location: [facility];  Service: Endoscopy;;   PROSTATE SURGERY     RIGHT HEART CATH AND CORONARY ANGIOGRAPHY N/A 10/09/2022   Procedure: RIGHT HEART CATH AND CORONARY ANGIOGRAPHY;  Surgeon: [name], MD;  Location: [facility] INVASIVE CV LAB;  Service: Cardiovascular;  Laterality: N/A;   ROBOT ASSISTED LAPAROSCOPIC NEPHRECTOMY Left 07/14/2019   Procedure: XI ROBOTIC ASSISTED LAPAROSCOPIC RETROPERITONEAL NEPHRECTOMY;  Surgeon: Sebastian Ache, MD;  Location: [facility];  Service: Urology;  Laterality: Left;  3 HRS   SMALL INTESTINE SURGERY     THYROIDECTOMY Bilateral 04/14/2023   Procedure: TOTAL THYROIDECTOMY;  Surgeon: [name], MD;  Location: [facility] OR;  Service: ENT;  Laterality: Bilateral;   TONSILLECTOMY  AS CHILD   TOTAL KNEE ARTHROPLASTY Left 2006   REVISION 2007  (AFTER I & D WITH ANTIBIOTIC SPACER PROCEDURE FOR STEPH INFECTION)   TOTAL KNEE ARTHROPLASTY Right 03/30/2016   Procedure: RIGHT TOTAL KNEE ARTHROPLASTY;  Surgeon: Durene Romans, MD;  Location: [facility];  Service: Orthopedics;  Laterality: Right;   TOTAL SHOULDER ARTHROPLASTY Right 04/06/2013   Procedure: RIGHT TOTAL SHOULDER ARTHROPLASTY;  Surgeon: Senaida Lange, MD;  Location: [facility] OR;  Service: Orthopedics;  Laterality: Right;   TOTAL SHOULDER ARTHROPLASTY Left 07/20/2013   Procedure: LEFT TOTAL SHOULDER ARTHROPLASTY;  Surgeon: Senaida Lange, MD;  Location: [facility] OR;  Service: Orthopedics;  Laterality: Left;   TRANSURETHRAL RESECTION OF BLADDER TUMOR N/A 11/12/2015   Procedure: TRANSURETHRAL RESECTION OF BLADDER TUMOR (TURBT);  Surgeon: Jerilee Field, MD;  Location: [facility];  Service: Urology;  Laterality: N/A;   TRANSURETHRAL RESECTION OF BLADDER TUMOR WITH GYRUS (TURBT-GYRUS) N/A 12/05/2013   Procedure: TRANSURETHRAL RESECTION OF BLADDER TUMOR WITH  GYRUS (TURBT-GYRUS);  Surgeon: Jerilee Field, MD;  Location: [facility];  Service: Urology;  Laterality: N/A;   Family History Family History  Problem Relation Age of Onset   Alzheimer's disease Mother    Heart attack Father    Heart disease Father    Arthritis Father    Hearing loss Father    Hypertension Father    Irregular heart beat Brother        DEFIB.   Congestive Heart Failure Brother    Heart disease Brother    Diabetes Daughter    Sleep apnea Neg Hx     Social History Social History   Tobacco Use   Smoking status: Former    Current packs/day: 0.00    Average packs/day: 2.0 packs/day for 40.0 years (80.0 ttl pk-yrs)    Types: Cigarettes    Start date: 06/01/1957    Quit date: 06/01/1997    Years since quitting: 26.2   Smokeless tobacco: Never  Vaping Use   Vaping status: Never Used  Substance Use Topics   Alcohol use: No   Drug use: No   Allergies Codeine sulfate  Review of Systems Review of Systems  Constitutional:  Positive for chills, fatigue and fever.  Gastrointestinal:  Positive for nausea.    Physical Exam Vital Signs  I have reviewed the triage vital signs BP 127/74 (BP Location: Right Arm)   Pulse 79   Temp 98.3 F (36.8 C) (Oral)   Resp 18   SpO2 95%   Physical Exam Constitutional:      General: He is not in acute distress.    Appearance: Normal appearance.  HENT:     Head: Normocephalic and atraumatic.     Nose: No congestion or rhinorrhea.  Eyes:     General:        Right eye: No discharge.        Left eye: No discharge.     Extraocular Movements: Extraocular movements intact.     Pupils: Pupils are equal, round, and reactive to light.  Cardiovascular:     Rate and Rhythm: Normal rate and regular rhythm.     Heart sounds: No murmur heard. Pulmonary:     Effort: No respiratory distress.     Breath sounds: No wheezing or rales.  Abdominal:     General: There is no distension.     Tenderness: There is no  abdominal tenderness.  Musculoskeletal:        General: Normal range of motion.     Cervical back: Normal range of motion.  Skin:    General: Skin is warm and dry.  Neurological:     General: No focal deficit present.     Mental Status: He is alert.     ED Results and Treatments Labs (all labs ordered are listed, but only abnormal results are displayed) Labs Reviewed  COMPREHENSIVE METABOLIC PANEL - Abnormal; Notable for the following components:      Result Value   Sodium 132 (*)    CO2 21 (*)    Glucose, Bld 112 (*)    BUN 25 (*)    Creatinine, Ser 1.60 (*)    AST 57 (*)    GFR, Estimated 45 (*)    All other components within normal limits  CBC WITH DIFFERENTIAL/PLATELET - Abnormal; Notable for the following components:   WBC 3.0 (*)    RDW 17.0 (*)    Platelets 85 (*)    Lymphs Abs 0.4 (*)    All  other components within normal limits  URINALYSIS, ROUTINE W REFLEX MICROSCOPIC - Abnormal; Notable for the following components:   APPearance HAZY (*)    Hgb urine dipstick SMALL (*)    Protein, ur 30 (*)    Leukocytes,Ua LARGE (*)    Bacteria, UA RARE (*)    All other components within normal limits  RESP PANEL BY RT-PCR (RSV, FLU A&B, COVID)  RVPGX2  URINE CULTURE  CULTURE, BLOOD (ROUTINE X 2)  CULTURE, BLOOD (ROUTINE X 2)                                                                                                                          Radiology DG Chest 2 View Result Date: 08/21/2023 CLINICAL DATA:  r/o PNA EXAM: CHEST - 2 VIEW COMPARISON:  Chest x-ray 05/29/2023. FINDINGS: Left chest wall triple lead cardiac pacemaker/defibrillator. The heart and mediastinal contours are unchanged. Atherosclerotic plaque. Similar pericentimeter round density overlying the right lower lung zone suggestive of a nipple shadow. No focal consolidation. No pulmonary edema. No pleural effusion. No pneumothorax. No acute osseous abnormality. Bilateral total shoulder arthroplasty.  IMPRESSION: 1. No active cardiopulmonary disease. 2.  Aortic Atherosclerosis (ICD10-I70.0). Electronically Signed   By: Tish Frederickson M.D.   On: 08/21/2023 21:30    Pertinent labs & imaging results that were available during my care of the patient were reviewed by me and considered in my medical decision making (see MDM for details).  Medications Ordered in ED Medications  meropenem (MERREM) 1 g in sodium chloride 0.9 % 100 mL IVPB (has no administration in time range)                                                                                                                                     Procedures Procedures  (including critical care time)  Medical Decision Making / ED Course   This patient presents to the ED for concern of chills, fatigue, nausea, fever, this involves an extensive number of treatment options, and is a complaint that carries with it a high risk of complications and morbidity.  The differential diagnosis includes upper respiratory infection, COVID-19, influenza, RSV, bacteremia, UTI, electrolyte abnormality, dehydration  MDM: Patient seen emergency room for evaluation of fever, chills.  Physical exam largely unremarkable.  Laboratory evaluation with a new leukopenia to 3.0 with associated lymphopenia, BUN 25, creatinine 1.6 which is near patient's  baseline, urinalysis with large leuk esterase, 21-50 white blood cells and rare bacteria.  Chest x-ray unremarkable.  COVID, flu, RSV negative.  Blood and urine cultures obtained.  Review of previous micro shows multidrug-resistant Klebsiella and there are no appropriate oral options for the patient at this time.  He will require hospital admission for recurrent fevers, new leukopenia and multidrug-resistant UTI.  Meropenem ordered and patient require hospital admission.   Additional history obtained: -Additional history obtained from wife -External records from outside source obtained and reviewed including: Chart  review including previous notes, labs, imaging, consultation notes   Lab Tests: -I ordered, reviewed, and interpreted labs.   The pertinent results include:   Labs Reviewed  COMPREHENSIVE METABOLIC PANEL - Abnormal; Notable for the following components:      Result Value   Sodium 132 (*)    CO2 21 (*)    Glucose, Bld 112 (*)    BUN 25 (*)    Creatinine, Ser 1.60 (*)    AST 57 (*)    GFR, Estimated 45 (*)    All other components within normal limits  CBC WITH DIFFERENTIAL/PLATELET - Abnormal; Notable for the following components:   WBC 3.0 (*)    RDW 17.0 (*)    Platelets 85 (*)    Lymphs Abs 0.4 (*)    All other components within normal limits  URINALYSIS, ROUTINE W REFLEX MICROSCOPIC - Abnormal; Notable for the following components:   APPearance HAZY (*)    Hgb urine dipstick SMALL (*)    Protein, ur 30 (*)    Leukocytes,Ua LARGE (*)    Bacteria, UA RARE (*)    All other components within normal limits  RESP PANEL BY RT-PCR (RSV, FLU A&B, COVID)  RVPGX2  URINE CULTURE  CULTURE, BLOOD (ROUTINE X 2)  CULTURE, BLOOD (ROUTINE X 2)      Imaging Studies ordered: I ordered imaging studies including chest x-ray I independently visualized and interpreted imaging. I agree with the radiologist interpretation   Medicines ordered and prescription drug management: Meds ordered this encounter  Medications   meropenem (MERREM) 1 g in sodium chloride 0.9 % 100 mL IVPB    Antibiotic Indication::   ESBL Infection    -I have reviewed the patients home medicines and have made adjustments as needed  Critical interventions none    Cardiac Monitoring: The patient was maintained on a cardiac monitor.  I personally viewed and interpreted the cardiac monitored which showed an underlying rhythm of: NSR  Social Determinants of Health:  Factors impacting patients care include: none   Reevaluation: After the interventions noted above, I reevaluated the patient and found that they  have :improved  Co morbidities that complicate the patient evaluation  Past Medical History:  Diagnosis Date   AICD (automatic cardioverter/defibrillator) present    Abbott/St Jude   Anemia aprox. may-1-24   Arthritis    knees, shoulders, elbows   Bladder cancer [facility])    urologist-  dr Mena Goes   CHF (congestive heart failure) (HCC)    Chronic kidney disease    left kidney removed   Diverticulosis of colon    Fibromyalgia    GERD (gastroesophageal reflux disease)    History of diverticulitis of colon    History of gastric ulcer    due to aleve   Hypertension    Lower urinary tract symptoms (LUTS)    Myocardial infarction (HCC) may 1-24   OSA (obstructive sleep apnea)    Wears CPAP nightly  Oxygen deficiency    PONV (postoperative nausea and vomiting)    Psoriasis    Sepsis (HCC)    january 2021   Sleep apnea    Thyroid disease    biopsy on 10-08-22   Tinnitus    right ear more, has tranmitter in right ear removable at hs   Ulcer    Wears glasses    Wears partial dentures       Dispostion: I considered admission for this patient, and patient will require hospital admission for multidrug-resistant UTI requiring meropenem    Final Clinical Impression(s) / ED Diagnoses Final diagnoses:  Acute cystitis with hematuria     @PCDICTATION @    Glendora Score, MD 08/21/23 2330

## 2023-08-21 NOTE — H&P (Signed)
 History and Physical    Patient: Billy Rasmussen:[id number] DOB: 11/26/1945 DOA: 08/21/2023 DOS: the patient was seen and examined on 08/22/2023 PCP: Raliegh Ip, DO  Patient coming from: Home  Chief Complaint: No chief complaint on file.  HPI: Billy Rasmussen is a 78 y.o. male with medical history significant of combined systolic and diastolic CHF (LVEF 25-30 % -05/19/2023), LBBB s/p CRT-D, HTN, CKD IIIa, thyroid cancer s/p thyroidectomy, bladder cancer s/p cystectomy with urostomy in place, thrombocytopenia & OSA (not on CPAP), prior history of ESBL Klebsiella UTI requiring IV antibiotics who presents to the emergency department due to complaint of 2-day onset of fever, chills, generalized weakness.  Tmax for fever at home was 102.60F, patient states symptoms were similar to prior episodes of UTI, so he presented to the ED for further evaluation and management.  He denies nausea, vomiting, abdominal pain, chest pain or shortness of breath.  ED Course:  In the emergency department, temperature on arrival was 100 F, BP 103/70, other Rasmussen signs were within normal range.  Workup in the ED showed leukopenia with WBC of 3.0 and platelets 85.  BMP showed sodium 132, potassium 4.4, chloride 99, bicarb 21, blood glucose 112, BUN 25, creatinine 1.60, AST 57, GFR 45.  Urinalysis was suspicious for UTI.  Influenza A, B, SARS coronavirus 2, RSV was negative.  Blood culture pending. Chest x-ray showed no active cardiopulmonary disease Patient was treated with IV meropenem.  Hospitalist was asked to admit patient for further evaluation and management.  Review of Systems: Review of systems as noted in the HPI. All other systems reviewed and are negative.   Past Medical History:  Diagnosis Date   AICD (automatic cardioverter/defibrillator) present    [employer]   Anemia aprox. may-1-24   Arthritis    knees, shoulders, elbows   Bladder cancer [facility])    urologist-  dr Mena Goes   CHF  (congestive heart failure) (HCC)    Chronic kidney disease    left kidney removed   Diverticulosis of colon    Fibromyalgia    GERD (gastroesophageal reflux disease)    History of diverticulitis of colon    History of gastric ulcer    due to aleve   Hypertension    Lower urinary tract symptoms (LUTS)    Myocardial infarction (HCC) may 1-24   OSA (obstructive sleep apnea)    Wears CPAP nightly   Oxygen deficiency    PONV (postoperative nausea and vomiting)    Psoriasis    Sepsis (HCC)    january 2021   Sleep apnea    Thyroid disease    biopsy on 10-08-22   Tinnitus    right ear more, has tranmitter in right ear removable at hs   Ulcer    Wears glasses    Wears partial dentures    Past Surgical History:  Procedure Laterality Date   APPENDECTOMY     BIV ICD INSERTION CRT-D N/A 03/01/2023   Procedure: BIV ICD INSERTION CRT-D;  Surgeon: Maurice Small, MD;  [facility];  Service: Cardiovascular;  Laterality: N/A;   COLECTOMY W/ COLOSTOMY  1996   W/   APPENDECTOMY   COLONOSCOPY N/A 05/11/2018   Procedure: COLONOSCOPY;  Surgeon: Corbin Ade, MD;  [facility];  Service: Endoscopy;  Laterality: N/A;  9:30   COLOSTOMY TAKEDOWN  1996   CYSTOSCOPY WITH BIOPSY N/A 12/05/2013   Procedure: CYSTO BLADDER BIOPSY AND FULGERATION;  Surgeon: Jerilee Field,  MD;  Location: [facility];  Service: Urology;  Laterality: N/A;   CYSTOSCOPY WITH BIOPSY Bilateral 11/13/2014   Procedure: CYSTOSCOPY WITH  BLADDER BIOPSY FULGERATION AND BILATERAL RETROGRADE PYELOGRAMS;  Surgeon: Jerilee Field, MD;  Location[facility];  Service: Urology;  Laterality: Bilateral;   CYSTOSCOPY WITH FULGERATION N/A 01/18/2018   Procedure: CYSTOSCOPY WITH FULGERATION/ BLADDER BIOPSY;  Surgeon: Jerilee Field, MD;  Location: [facility];  Service: Urology;  Laterality: N/A;   CYSTOSCOPY WITH INJECTION N/A 11/09/2018   Procedure:  CYSTOSCOPY WITH INJECTION OF INDOCYANINE GREEN DYE;  Surgeon: Sebastian Ache, MD;  Location: [facility];  Service: Urology;  Laterality: N/A;   CYSTOSCOPY WITH INSERTION OF UROLIFT N/A 01/18/2018   Procedure: CYSTOSCOPY WITH INSERTION OF UROLIFT;  Surgeon: Jerilee Field, MD;  Location: [facility];  Service: Urology;  Laterality: N/A;   CYSTOSCOPY/URETEROSCOPY/HOLMIUM LASER Left 02/17/2019   Procedure: URETEROSCOPY WITH BALLOON DILATION  AND NEPHROSTOGRAM;  Surgeon: Sebastian Ache, MD;  Location[facility];  Service: Urology;  Laterality: Left;  1 HR   EXCISION RIGHT UPPER ARM LIPOMA  2005   FRACTURE SURGERY     HEMORROIDECTOMY     INGUINAL HERNIA REPAIR Left 1984   IR NEPHROSTOMY PLACEMENT LEFT  01/05/2019   JOINT REPLACEMENT     both  knees, and both shoulders joints replaced.   KNEE ARTHROSCOPY Left X3  LAST ONE  2002   ORIF LEFT HUMEROUS FX  1976   POLYPECTOMY  05/11/2018   Procedure: POLYPECTOMY;  Surgeon: Corbin Ade, MD;  [facility];  Service: Endoscopy;;   PROSTATE SURGERY     RIGHT HEART CATH AND CORONARY ANGIOGRAPHY N/A 10/09/2022   Procedure: RIGHT HEART CATH AND CORONARY ANGIOGRAPHY;  Surgeon: [name], MD;  [facility];  Service: Cardiovascular;  Laterality: N/A;   ROBOT ASSISTED LAPAROSCOPIC NEPHRECTOMY Left 07/14/2019   Procedure: XI ROBOTIC ASSISTED LAPAROSCOPIC RETROPERITONEAL NEPHRECTOMY;  Surgeon: Sebastian Ache, MD;  Location: [facility];  Service: Urology;  Laterality: Left;  3 HRS   SMALL INTESTINE SURGERY     THYROIDECTOMY Bilateral 04/14/2023   Procedure: TOTAL THYROIDECTOMY;  Surgeon: [name], MD;  Location: [facility] OR;  Service: ENT;  Laterality: Bilateral;   TONSILLECTOMY  AS CHILD   TOTAL KNEE ARTHROPLASTY Left 2006   REVISION 2007  (AFTER I & D WITH ANTIBIOTIC SPACER PROCEDURE FOR STEPH INFECTION)   TOTAL KNEE ARTHROPLASTY Right 03/30/2016   Procedure: RIGHT TOTAL KNEE ARTHROPLASTY;   Surgeon: Durene Romans, MD;  Location: [facility];  Service: Orthopedics;  Laterality: Right;   TOTAL SHOULDER ARTHROPLASTY Right 04/06/2013   Procedure: RIGHT TOTAL SHOULDER ARTHROPLASTY;  Surgeon: Senaida Lange, MD;  Location[facility];  Service: Orthopedics;  Laterality: Right;   TOTAL SHOULDER ARTHROPLASTY Left 07/20/2013   Procedure: LEFT TOTAL SHOULDER ARTHROPLASTY;  Surgeon: Senaida Lange, MD;  Location[facility];  Service: Orthopedics;  Laterality: Left;   TRANSURETHRAL RESECTION OF BLADDER TUMOR N/A 11/12/2015   Procedure: TRANSURETHRAL RESECTION OF BLADDER TUMOR (TURBT);  Surgeon: Jerilee Field, MD;  Location: [facility];  Service: Urology;  Laterality: N/A;   TRANSURETHRAL RESECTION OF BLADDER TUMOR WITH GYRUS (TURBT-GYRUS) N/A 12/05/2013   Procedure: TRANSURETHRAL RESECTION OF BLADDER TUMOR WITH GYRUS (TURBT-GYRUS);  Surgeon: Jerilee Field, MD;  Location: [facility];  Service: Urology;  Laterality: N/A;    Social History:  reports that he quit smoking about 26 years ago. His smoking use  included cigarettes. He started smoking about 66 years ago. He has a 80 pack-year smoking history. He has never used smokeless tobacco. He reports that he does not drink alcohol and does not use drugs.   Allergies  Allergen Reactions   Codeine Sulfate Other (See Comments)    Family History  Problem Relation Age of Onset   Alzheimer's disease Mother    Heart attack Father    Heart disease Father    Arthritis Father    Hearing loss Father    Hypertension Father    Irregular heart beat Brother        DEFIB.   Congestive Heart Failure Brother    Heart disease Brother    Diabetes Daughter    Sleep apnea Neg Hx      Prior to Admission medications   Medication Sig Start Date End Date Taking? Authorizing Provider  acetaminophen (TYLENOL) 650 MG CR tablet Take 1,300 mg by mouth daily as needed for pain.    [provider]  Albuterol-Budesonide (AIRSUPRA)  90-80 MCG/ACT AERO Inhale 2 puffs into the lungs every 6 (six) hours as needed (shortness of breath/ wheeze). 03/16/23   Raliegh Ip, DO  atorvastatin (LIPITOR) 10 MG tablet Take 1 tablet (10 mg total) by mouth daily. 06/28/23 09/26/23  Little Ishikawa, MD  azelastine (ASTELIN) 0.1 % nasal spray Place 1 spray into both nostrils 2 (two) times daily. Use in each nostril as directed 08/18/23   Martina Sinner, NP  azithromycin (ZITHROMAX Z-PAK) 250 MG tablet As directed 08/18/23   Martina Sinner, NP  fluticasone [facility]) 50 MCG/ACT nasal spray Place 1 spray into both nostrils daily. 02/03/23   [provider]  furosemide (LASIX) 20 MG tablet Take 1 tablet (20 mg) daily as needed if you gain more than 3 pounds in 1 day or 5 pounds in 1 week. 10/29/22   Allayne Butcher, PA-C  levothyroxine (SYNTHROID) 100 MCG tablet Take 1 tablet (100 mcg total) by mouth daily. 07/30/23   Raliegh Ip, DO  losartan (COZAAR) 25 MG tablet Take 1 tablet (25 mg total) by mouth daily. 07/12/23 10/10/23  Little Ishikawa, MD  metoprolol succinate (TOPROL-XL) 25 MG 24 hr tablet Take 1 tablet (25 mg total) by mouth daily. 12/14/22   Raliegh Ip, DO  Multiple Vitamin (MULTIVITAMIN) capsule Take 1 capsule by mouth daily.    [provider]  ondansetron (ZOFRAN-ODT) 4 MG disintegrating tablet Take 1 tablet (4 mg total) by mouth every 8 (eight) hours as needed for nausea or vomiting. 06/08/23   Raliegh Ip, DO  polyvinyl alcohol (LIQUIFILM TEARS) 1.4 % ophthalmic solution Place 1 drop into both eyes as needed for dry eyes. 05/16/19   Sheth, Devam P, MD  Saline (NASOGEL) GEL Place 1 application  into the nose 2 (two) times daily as needed. 07/06/23   St Vena Austria, NP  spironolactone (ALDACTONE) 25 MG tablet Take 12.5 mg by mouth daily. 05/18/23   [provider]    Physical Exam: BP 122/75 (BP Location: Left Arm)   Pulse 88   Temp 97.7  F (36.5 C) (Oral)   Resp 20   Wt 95.4 kg   SpO2 100%   BMI 33.95 kg/m   General: 78 y.o. year-old male well developed well nourished in no acute distress.  Alert and oriented x3. HEENT: NCAT, EOMI Neck: Supple, trachea medial Cardiovascular: Regular rate and rhythm with no rubs or gallops.  No  thyromegaly or JVD noted.  No lower extremity edema. 2/4 pulses in all 4 extremities. Respiratory: Clear to auscultation with no wheezes or rales. Good inspiratory effort. Abdomen: Soft, nontender nondistended with normal bowel sounds x4 quadrants. Muskuloskeletal: No cyanosis, clubbing or edema noted bilaterally Neuro: CN II-XII intact, strength 5/5 x 4, sensation, reflexes intact Skin: No ulcerative lesions noted or rashes Psychiatry: Judgement and insight appear normal. Mood is appropriate for condition and setting          Labs on Admission:  Basic Metabolic Panel: Recent Labs  Lab 08/21/23 2110  NA 132*  K 4.4  CL 99  CO2 21*  GLUCOSE 112*  BUN 25*  CREATININE 1.60*  CALCIUM 9.2   Liver Function Tests: Recent Labs  Lab 08/21/23 2110  AST 57*  ALT 31  ALKPHOS 60  BILITOT 1.2  PROT 7.8  ALBUMIN 4.0   No results for input(s): "LIPASE", "AMYLASE" in the last 168 hours. No results for input(s): "AMMONIA" in the last 168 hours. CBC: Recent Labs  Lab 08/21/23 2110  WBC 3.0*  NEUTROABS 2.1  HGB 13.4  HCT 39.0  MCV 92.2  PLT 85*   Cardiac Enzymes: No results for input(s): "CKTOTAL", "CKMB", "CKMBINDEX", "TROPONINI" in the last 168 hours.  BNP (last 3 results) Recent Labs    10/11/22 1020 05/13/23 0850 05/19/23 1207  BNP 183.9* 88.0 64.6    ProBNP (last 3 results) No results for input(s): "PROBNP" in the last 8760 hours.  CBG: No results for input(s): "GLUCAP" in the last 168 hours.  Radiological Exams on Admission: DG Chest 2 View Result Date: 08/21/2023 CLINICAL DATA:  r/o PNA EXAM: CHEST - 2 VIEW COMPARISON:  Chest x-ray 05/29/2023. FINDINGS: Left  chest wall triple lead cardiac pacemaker/defibrillator. The heart and mediastinal contours are unchanged. Atherosclerotic plaque. Similar pericentimeter round density overlying the right lower lung zone suggestive of a nipple shadow. No focal consolidation. No pulmonary edema. No pleural effusion. No pneumothorax. No acute osseous abnormality. Bilateral total shoulder arthroplasty. IMPRESSION: 1. No active cardiopulmonary disease. 2.  Aortic Atherosclerosis (ICD10-I70.0). Electronically Signed   By: Tish Frederickson M.D.   On: 08/21/2023 21:30    EKG: I independently viewed the EKG done and my findings are as followed: EKG was not done in the ED  Assessment/Plan Present on Admission:  UTI (urinary tract infection)  Thrombocytopenia (HCC)  Acquired hypothyroidism  OSA (obstructive sleep apnea)  Bladder cancer (HCC)  Essential hypertension  Principal Problem:   UTI (urinary tract infection) Active Problems:   Essential hypertension   Bladder cancer (HCC)   OSA (obstructive sleep apnea)   Thrombocytopenia (HCC)   Acquired hypothyroidism   Leukopenia   CKD stage 3a, GFR 45-59 ml/min (HCC)   Chronic combined systolic and diastolic CHF (congestive heart failure) (HCC)   Mixed hyperlipidemia  UTI POA Prior urine culture done on 12/26/2019 was positive for Klebsiella pneumonia which was sensitive to imipenem, patient was started on IV meropenem, we shall continue same at this time. Urine and blood culture pending  Leukopenia possibly reactive vs infectious WBC 3.0, continue management as Above  Thrombocytopenia possibly reactive Platelets 85, continue to monitor platelet levels  CKD 3A Creatinine at 1.60, this is within baseline range Renally adjust medications, avoid nephrotoxic agents/dehydration/hypotension  Combined systolic and diastolic CHF Stable and compensated.  Last echo LVEF 25-30 % -05/19/2023, but echo done on 12/2022 EF of less than 20%. Continue metoprolol, losartan,  Lipitor   Acquired hypothyroidism Status post thyroidectomy for thyroid  cancer at [facility] 04/2023.   Continue Synthroid   OSA (obstructive sleep apnea) Continue CPAP nightly   Bladder cancer  Urostomy status.   Essential hypertension (controlled) Continue metoprolol, losartan  Mixed hyperlipidemia Continue Lipitor    DVT prophylaxis: SCDs  Code Status: Full code  Family Communication: None at bedside  Consults: None  Severity of Illness: The appropriate patient status for this patient is INPATIENT. Inpatient status is judged to be reasonable and necessary in order to provide the required intensity of service to ensure the patient's safety. The patient's presenting symptoms, physical exam findings, and initial radiographic and laboratory data in the context of their chronic comorbidities is felt to place them at high risk for further clinical deterioration. Furthermore, it is not anticipated that the patient will be medically stable for discharge from the hospital within 2 midnights of admission.   * I certify that at the point of admission it is my clinical judgment that the patient will require inpatient hospital care spanning beyond 2 midnights from the point of admission due to high intensity of service, high risk for further deterioration and high frequency of surveillance required.*  Author: Frankey Shown, DO 08/22/2023 3:49 AM  For on call review [email or web address].

## 2023-08-22 ENCOUNTER — Encounter (HOSPITAL_COMMUNITY): Payer: Self-pay | Admitting: Internal Medicine

## 2023-08-22 ENCOUNTER — Inpatient Hospital Stay (HOSPITAL_COMMUNITY)

## 2023-08-22 DIAGNOSIS — N3001 Acute cystitis with hematuria: Secondary | ICD-10-CM | POA: Diagnosis not present

## 2023-08-22 DIAGNOSIS — N1831 Chronic kidney disease, stage 3a: Secondary | ICD-10-CM | POA: Insufficient documentation

## 2023-08-22 DIAGNOSIS — I472 Ventricular tachycardia, unspecified: Secondary | ICD-10-CM

## 2023-08-22 DIAGNOSIS — I5042 Chronic combined systolic (congestive) and diastolic (congestive) heart failure: Secondary | ICD-10-CM | POA: Diagnosis not present

## 2023-08-22 DIAGNOSIS — D72819 Decreased white blood cell count, unspecified: Secondary | ICD-10-CM | POA: Insufficient documentation

## 2023-08-22 DIAGNOSIS — E782 Mixed hyperlipidemia: Secondary | ICD-10-CM | POA: Insufficient documentation

## 2023-08-22 DIAGNOSIS — D696 Thrombocytopenia, unspecified: Secondary | ICD-10-CM | POA: Diagnosis not present

## 2023-08-22 DIAGNOSIS — R6510 Systemic inflammatory response syndrome (SIRS) of non-infectious origin without acute organ dysfunction: Secondary | ICD-10-CM

## 2023-08-22 DIAGNOSIS — R9431 Abnormal electrocardiogram [ECG] [EKG]: Secondary | ICD-10-CM

## 2023-08-22 LAB — CBC
HCT: 37.6 % — ABNORMAL LOW (ref 39.0–52.0)
HCT: 37.1 % — ABNORMAL LOW (ref 39.0–52.0)
Hemoglobin: 13.2 g/dL (ref 13.0–17.0)
Hemoglobin: 13 g/dL (ref 13.0–17.0)
MCH: 32 pg (ref 26.0–34.0)
MCH: 31.8 pg (ref 26.0–34.0)
MCHC: 35.1 g/dL (ref 30.0–36.0)
MCHC: 35 g/dL (ref 30.0–36.0)
MCV: 91.3 fL (ref 80.0–100.0)
MCV: 90.7 fL (ref 80.0–100.0)
Platelets: 80 10*3/uL — ABNORMAL LOW (ref 150–400)
Platelets: 75 10*3/uL — ABNORMAL LOW (ref 150–400)
RBC: 4.12 MIL/uL — ABNORMAL LOW (ref 4.22–5.81)
RBC: 4.09 MIL/uL — ABNORMAL LOW (ref 4.22–5.81)
RDW: 17.2 % — ABNORMAL HIGH (ref 11.5–15.5)
RDW: 17.2 % — ABNORMAL HIGH (ref 11.5–15.5)
WBC: 3.1 10*3/uL — ABNORMAL LOW (ref 4.0–10.5)
WBC: 2.9 10*3/uL — ABNORMAL LOW (ref 4.0–10.5)
nRBC: 0 % (ref 0.0–0.2)
nRBC: 0 % (ref 0.0–0.2)

## 2023-08-22 LAB — COMPREHENSIVE METABOLIC PANEL
ALT: 34 U/L (ref 0–44)
AST: 65 U/L — ABNORMAL HIGH (ref 15–41)
Albumin: 4 g/dL (ref 3.5–5.0)
Alkaline Phosphatase: 56 U/L (ref 38–126)
Anion gap: 9 (ref 5–15)
BUN: 25 mg/dL — ABNORMAL HIGH (ref 8–23)
CO2: 22 mmol/L (ref 22–32)
Calcium: 9 mg/dL (ref 8.9–10.3)
Chloride: 99 mmol/L (ref 98–111)
Creatinine, Ser: 1.4 mg/dL — ABNORMAL HIGH (ref 0.61–1.24)
GFR, Estimated: 52 mL/min — ABNORMAL LOW (ref >60)
Glucose, Bld: 110 mg/dL — ABNORMAL HIGH (ref 70–99)
Potassium: 4.5 mmol/L (ref 3.5–5.1)
Sodium: 130 mmol/L — ABNORMAL LOW (ref 135–145)
Total Bilirubin: 1.6 mg/dL — ABNORMAL HIGH (ref 0.0–1.2)
Total Protein: 7.6 g/dL (ref 6.5–8.1)

## 2023-08-22 LAB — RESPIRATORY PANEL BY PCR

## 2023-08-22 LAB — PROCALCITONIN: Procalcitonin: 0.36 ng/mL

## 2023-08-22 LAB — FOLATE: Folate: >40 ng/mL (ref >5.9)

## 2023-08-22 LAB — LACTIC ACID, PLASMA
Lactic Acid, Venous: 1.3 mmol/L (ref 0.5–1.9)
Lactic Acid, Venous: 1.3 mmol/L (ref 0.5–1.9)

## 2023-08-22 LAB — VITAMIN B12: Vitamin B-12: 391 pg/mL (ref 180–914)

## 2023-08-22 LAB — PHOSPHORUS: Phosphorus: 2.6 mg/dL (ref 2.5–4.6)

## 2023-08-22 LAB — MAGNESIUM: Magnesium: 2 mg/dL (ref 1.7–2.4)

## 2023-08-22 MED ORDER — METOPROLOL SUCCINATE ER 50 MG PO TB24
25.0000 mg | ORAL_TABLET | Freq: Every day | ORAL | Status: DC
  Administered 2023-08-22: 25 mg via ORAL
  Filled 2023-08-22: qty 1

## 2023-08-22 MED ORDER — ACETAMINOPHEN 325 MG PO TABS
650.0000 mg | ORAL_TABLET | Freq: Four times a day (QID) | ORAL | Status: DC | PRN
  Administered 2023-08-22 – 2023-08-24 (×4): 650 mg via ORAL
  Filled 2023-08-22 (×4): qty 2

## 2023-08-22 MED ORDER — ATORVASTATIN CALCIUM 10 MG PO TABS
10.0000 mg | ORAL_TABLET | Freq: Every day | ORAL | Status: DC
  Administered 2023-08-22 – 2023-08-26 (×5): 10 mg via ORAL
  Filled 2023-08-22 (×5): qty 1

## 2023-08-22 MED ORDER — LEVOTHYROXINE SODIUM 112 MCG PO TABS
112.0000 ug | ORAL_TABLET | Freq: Every day | ORAL | Status: DC
  Administered 2023-08-22 – 2023-08-26 (×5): 112 ug via ORAL
  Filled 2023-08-22 (×5): qty 1

## 2023-08-22 MED ORDER — VANCOMYCIN HCL 1250 MG/250ML IV SOLN
1250.0000 mg | INTRAVENOUS | Status: DC
  Administered 2023-08-23: 1250 mg via INTRAVENOUS
  Filled 2023-08-22: qty 250

## 2023-08-22 MED ORDER — IOHEXOL 9 MG/ML PO SOLN
500.0000 mL | ORAL | Status: AC
  Administered 2023-08-22 (×2): 500 mL via ORAL

## 2023-08-22 MED ORDER — PROCHLORPERAZINE EDISYLATE 10 MG/2ML IJ SOLN: 10.0000 mg | Freq: Four times a day (QID) | INTRAMUSCULAR | Status: DC | PRN

## 2023-08-22 MED ORDER — METOPROLOL SUCCINATE ER 50 MG PO TB24: 50.0000 mg | ORAL_TABLET | Freq: Every day | ORAL | Status: DC

## 2023-08-22 MED ORDER — ACETAMINOPHEN 650 MG RE SUPP: 650.0000 mg | Freq: Four times a day (QID) | RECTAL | Status: DC | PRN

## 2023-08-22 MED ORDER — SODIUM CHLORIDE 0.9 % IV SOLN
1.0000 g | Freq: Two times a day (BID) | INTRAVENOUS | Status: DC
  Administered 2023-08-22 – 2023-08-26 (×9): 1 g via INTRAVENOUS
  Filled 2023-08-22 (×9): qty 20

## 2023-08-22 MED ORDER — LOSARTAN POTASSIUM 25 MG PO TABS
25.0000 mg | ORAL_TABLET | Freq: Every day | ORAL | Status: DC
  Administered 2023-08-22: 25 mg via ORAL
  Filled 2023-08-22: qty 1

## 2023-08-22 MED ORDER — CHLORHEXIDINE GLUCONATE CLOTH 2 % EX PADS
6.0000 | MEDICATED_PAD | Freq: Every day | CUTANEOUS | Status: DC
  Administered 2023-08-23 – 2023-08-25 (×3): 6 via TOPICAL

## 2023-08-22 MED ORDER — METOPROLOL SUCCINATE ER 50 MG PO TB24
50.0000 mg | ORAL_TABLET | Freq: Every day | ORAL | Status: DC
  Administered 2023-08-23 – 2023-08-26 (×4): 50 mg via ORAL
  Filled 2023-08-22 (×4): qty 1

## 2023-08-22 MED ORDER — VANCOMYCIN HCL 2000 MG/400ML IV SOLN
2000.0000 mg | Freq: Once | INTRAVENOUS | Status: AC
  Administered 2023-08-22: 2000 mg via INTRAVENOUS
  Filled 2023-08-22: qty 400

## 2023-08-22 NOTE — Hospital Course (Addendum)
 78 year old male with history of bladder cancer status post cystoprostatectomy  with ileal conduit 10/2018, left nephrectomy (07/2019),  HFrEF (09/2022, LVEF was <20% ) s/p CRT-D (01/2023), nonobstructive CAD  LBBB, prior LV thrombus, pericarditis 05/2023, hypertension, HLD, CKD stage IIIa, thyroid cancer status post thyroidectomy (04/2023) presenting with 2-day history of generalized weakness, fevers, chills, myalgias, and arthralgias.  He states that his symptoms have worsened over the past 2 days.  He had a temperature 102.2 F at home.  Review of medical record shows that the patient was recently seen by PCP on 08/18/2023 and placed on azithromycin for URI type symptoms.  Patient was recently seen by his PCP on 08/18/2023.  He was placed on azithromycin at that time for URI type symptoms.  Notably, the patient had a hospital admission to [facility] on 05/19/2023 to 05/22/2023.  At that time he presented as a transfer from [facility] because of concerns for aortitis versus other rheumatologic condition/vasculitis.  At that time, the patient had been diagnosed with pericarditis but continued to have chest pain despite aspirin and colchicine.  CT imaging at [facility] was independently reviewed by [facility] team and we did not see any concerns for aortic pathology. His CRT device was interrogated and demonstrated no events and he was BiV pacing 98%. As his symptoms were felt consistent with pericarditis, he was restarted on aspirin and colchicine as well as pain control and his symptoms improved. Corticosteroids were held given concern for worsening volume retention and HFrEF. He was eval by rheumatology, and an infectious workup was negative. Marland Kitchen He was also seen by ophthalmology who were less concern for GCA but recommended he have updated prescription for glasses outpatient.   After the patient was admitted, he was started on Merrem.  Urine cultures and blood cultures were obtained.  CT of the abdomen pelvis did not show  any acute abnormalities in the right, particularly no acute abnormalities in the right kidney.  The patient continued to persistent fevers.  Vancomycin was added.  CT of the chest was obtained.  The patient was transferred to SDU for closer monitoring secondary to his tachycardia and fevers.

## 2023-08-22 NOTE — Progress Notes (Signed)
 Progress Note  RN called due to patient being tachycardic, tachypneic and febrile.  BP was 148/85.  Tylenol was given.  RN called back about 15 minutes later due to telemetry report that patient was having V. tach.  EKG obtained and personally reviewed showed wide QRS tachycardia at a rate of 127 bpm with nonspecific intraventricular block and QTc of 546 ms.  At bedside, patient was ill-appearing, febrile, but was not in any acute distress.  Chart was reviewed and potassium this morning was noted to be 4.5, magnesium 2.0. Patient was noted to have received vancomycin, this will be held at this time due to prolonged QT interval.   Patient has biventricular ICD insertion  SIRS with suspicion for sepsis in the setting of pyuria Give Tylenol for the fever Continue IV antibiotics per primary team  Wide QRS tachycardia Patient will be transferred to ICU for closer monitoring Repeat EKG in the morning  Prolonged QT interval QTc 546 ms Avoid QT prolonging drugs Magnesium level will be checked Repeat EKG in the morning    Total time:  33 minutes This includes time reviewing the chart including progress notes, labs, EKGs, taking medical decisions, ordering labs and documenting findings.  Please refer to admission H&P and progress notes for details regarding the care of this patient

## 2023-08-22 NOTE — TOC Initial Note (Signed)
 Transition of Care [facility]) - Initial/Assessment Note    Patient Details  Name: Billy Rasmussen MRN: [id number] Date of Birth: 1945-07-31  Transition of Care [facility]) CM/SW Contact:    Anabel Halon, RN Phone Number: 08/22/2023, 1:11 PM  Clinical Narrative:                 CM met with  patient at bedside. Patient reported he lives with his spouse and he has used home health in the past . No Home health services at this time. Patient is independent with adl's. Patient is  driving himselft to appointments.   Expected Discharge Plan: Home/Self Care Barriers to Discharge: No Barriers Identified   Patient Goals and CMS Choice            Expected Discharge Plan and Services       Living arrangements for the past 2 months: Single Family Home                                      Prior Living Arrangements/Services Living arrangements for the past 2 months: Single Family Home Lives with:: Spouse   Do you feel safe going back to the place where you live?: Yes               Activities of Daily Living   ADL Screening (condition at time of admission) Independently performs ADLs?: Yes (appropriate for developmental age) Is the patient deaf or have difficulty hearing?: No Does the patient have difficulty seeing, even when wearing glasses/contacts?: No Does the patient have difficulty concentrating, remembering, or making decisions?: No  Permission Sought/Granted                  Emotional Assessment Appearance:: Appears stated age Attitude/Demeanor/Rapport: Engaged Affect (typically observed): Appropriate Orientation: : Oriented to Self, Oriented to Place, Oriented to  Time, Oriented to Situation      Admission diagnosis:  UTI (urinary tract infection) [N39.0] Acute cystitis with hematuria [N30.01] Patient Active Problem List   Diagnosis Date Noted   Leukopenia 08/22/2023   CKD stage 3a, GFR 45-59 ml/min (HCC) 08/22/2023   Chronic combined systolic and  diastolic CHF (congestive heart failure) (HCC) 08/22/2023   Mixed hyperlipidemia 08/22/2023   Respiratory tract congestion with cough 08/18/2023   Bleeding from the nose 07/06/2023   Ageusia 07/06/2023   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   Hospital discharge follow-up 05/17/2023   Pericarditis 05/17/2023   Chest pain 05/13/2023   S/P total thyroidectomy 04/14/2023   HFrEF (heart failure with reduced ejection fraction) (HCC) 10/10/2022   Acute combined systolic and diastolic heart failure (HCC) 10/08/2022   Acute on chronic systolic CHF (congestive heart failure) (HCC) 10/08/2022   Hyponatremia 10/07/2022   Thrombocytopenia (HCC) 10/07/2022   Elevated troponin 10/07/2022   Acquired hypothyroidism 10/07/2022   Hypotension 10/06/2022   Multiple thyroid nodules 09/14/2022   Aneurysm of ascending aorta without rupture (HCC) 09/02/2022   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   Lumbar foraminal stenosis 03/21/2020   S/P radical cystoprostatectomy 12/28/2019   S/p nephrectomy 12/28/2019   Renal mass 07/15/2019   Nonfunctioning kidney 07/14/2019   UTI (urinary tract infection) 06/25/2019   OSA (obstructive sleep apnea) 05/12/2019   Abnormal renal function 01/20/2019   Gastroesophageal reflux disease    AKI (acute kidney injury) (HCC) 12/22/2018   Bladder cancer (HCC) 11/09/2018   BCG cystitis 02/02/2018   Dysuria 11/20/2017   Pure hypercholesterolemia  10/02/2016   Malignant neoplasm of urinary bladder (HCC) 10/02/2016   Lipoma 09/30/2016   Obese 03/31/2016   S/P right TKA 03/30/2016   S/P knee replacement 03/30/2016   Essential hypertension 02/25/2016   Obesity (BMI 30-39.9) 01/07/2016   S/P shoulder replacement 07/20/2013   Shoulder arthritis 04/07/2013   PCP:  Raliegh Ip, DO Pharmacy:   [facility] [phone] - [place], [place] - [address] [place] [address] Phone: [phone] Fax: [phone]     Social Drivers of Health (SDOH) Social  History: SDOH Screenings   Food Insecurity: Patient Declined (08/22/2023)  Housing: Patient Declined (08/22/2023)  Transportation Needs: Patient Declined (08/22/2023)  Utilities: At Risk (08/22/2023)  Alcohol Screen: Low Risk  (08/16/2023)  Depression (PHQ2-9): Low Risk  (08/16/2023)  Financial Resource Strain: Low Risk  (08/16/2023)  Recent Concern: Financial Resource Strain - Medium Risk (07/05/2023)  Physical Activity: Insufficiently Active (08/16/2023)  Social Connections: Patient Declined (08/22/2023)  Stress: No Stress Concern Present (08/16/2023)  Tobacco Use: Medium Risk (08/22/2023)  Health Literacy: Adequate Health Literacy (08/16/2023)   SDOH Interventions:     Readmission Risk Interventions    08/22/2023    1:09 PM  Readmission Risk Prevention Plan  Transportation Screening Complete  Home Care Screening Complete  Medication Review (RN CM) Complete

## 2023-08-22 NOTE — Plan of Care (Signed)

## 2023-08-22 NOTE — Progress Notes (Addendum)
 PROGRESS NOTE  Billy FECHER HQI:[id number] DOB: [birth year] DOA: 08/21/2023 PCP: Raliegh Ip, DO  Brief History:  78 year old male with history of bladder cancer status post cystoprostatectomy  with ileal conduit 10/2018, left nephrectomy (07/2019),  HFrEF (09/2022, LVEF was <20% ) s/p CRT-D (01/2023), nonobstructive CAD  LBBB, prior LV thrombus, pericarditis 05/2023, hypertension, HLD, CKD stage IIIa, thyroid cancer status post thyroidectomy (04/2023) presenting with 2-day history of generalized weakness, fevers, chills, myalgias, and arthralgias.  He states that his symptoms have worsened over the past 2 days.  He had a temperature 102.2 F at home.  Review of medical record shows that the patient was recently seen by PCP on 08/18/2023 and placed on azithromycin for URI type symptoms.  Patient was recently seen by his PCP on 08/18/2023.  He was placed on azithromycin at that time for URI type symptoms.  Notably, the patient had a hospital admission to [facility] on 05/19/2023 to 05/22/2023.  At that time he presented as a transfer from [facility] because of concerns for aortitis versus other rheumatologic condition/vasculitis.  At that time, the patient had been diagnosed with pericarditis but continued to have chest pain despite aspirin and colchicine.  CT imaging at [facility] was independently reviewed by [facility] team and we did not see any concerns for aortic pathology. His CRT device was interrogated and demonstrated no events and he was BiV pacing 98%. As his symptoms were felt consistent with pericarditis, he was restarted on aspirin and colchicine as well as pain control and his symptoms improved. Corticosteroids were held given concern for worsening volume retention and HFrEF. He was eval by rheumatology, and an infectious workup was negative. Marland Kitchen He was also seen by ophthalmology who were less concern for GCA but recommended he have updated prescription for glasses outpatient.     Assessment/Plan: Fever/pyuria -Patient has had a complex urologic history with complex urologic anatomy status post left nephrectomy 07/2019 secondary to recurrent pyelonephritis and ureteral stricture -Multiple previous MDR organisms including ESBL Klebsiella, Enterococcus, Morganella, and Pseudomonas -Continue empiric meropenem for now -Follow blood and urine culture -CT abd/pelvis--pt has vague R-abd pain -COVID/RSV/Flu--neg -check viral resp panel  CKD stage IIIa -Baseline creatinine 1.3-1.6  Chronic HFrEF -05/19/2023 echo EF 25 to 30%, grade 1 DD, mild decreased RVF -Clinically euvolemic -Continue metoprolol succinate, losartan, spironolactone -Not on SGLT2 secondary to recurrent infections -BiV ICD insertion with Dr. Nelly Laurence on 03/01/2023.   Bladder cancer -Status post cystoprostatectomy with ileal conduit -Status post left nephrectomy secondary recurrent pyelonephritis -Follow-up Dr. Berneice Heinrich  Thyroid cancer -Status post thyroidectomy 04/2023 -follow up Dr. Pollyann Kennedy  Thrombocytopenia -Patient has had a history of thrombocytopenia in the past and follows Dr. Shirline Frees -It was felt to be secondary to splenomegaly -Acute worsening secondary to infectious process -Monitor for signs of bleeding  Essential hypertension -Controlled -Continue metoprolol succinate and losartan  Coronary artery disease -nonobstructive -LHC on 10/09/2022 showed nonobstructive CAD with 30% RCA stenosis -No chest pain presently -Continue statin  History of SVT -Currently stable with sinus rhythm -Zio patch x 10 days 10/2022 showed 1 episode of NSVT lasting 8 beats, 10 episodes of SVT with longest lasting 19 beats.   Aortic aneurysm:  Ascending aortic aneurysm measuring 45 mm on CTPA 08/2022.      Family Communication: no  Family at bedside  Consultants:  none  Code Status:  FULL   DVT Prophylaxis:  SCDs   Procedures: As Listed in Progress  Note Above  Antibiotics: Merrem  3/22>>      Subjective: Patient continues to have some generalized weakness.  He has some vague abdominal pain on the right.  He has some nausea without emesis.  He denies any headache, chest pain.  He relates a nonproductive cough.  He has some mild dyspnea on exertion.  Objective: Vitals:   08/22/23 0000 08/22/23 0030 08/22/23 0100 08/22/23 0500  BP: 130/76 136/75 122/75 (!) 117/52  Pulse: 77 79 88 (!) 51  Resp:   20 18  Temp:   97.7 F (36.5 C) 98.5 F (36.9 C)  TempSrc:   Oral Oral  SpO2: 99% 96% 100% 96%  Weight:   95.4 kg    No intake or output data in the 24 hours ending 08/22/23 0802 Weight change:  Exam:  General:  Pt is alert, follows commands appropriately, not in acute distress HEENT: No icterus, No thrush, No neck mass, [place]/AT Cardiovascular: RRR, S1/S2, no rubs, no gallops Respiratory: Fine bibasilar crackles.  No wheezing. Abdomen: Soft/+BS, mild RUQ/RLQ tender, non distended, no guarding Extremities: No edema, No lymphangitis, No petechiae, No rashes, no synovitis   Data Reviewed: I have personally reviewed following labs and imaging studies Basic Metabolic Panel: Recent Labs  Lab 08/21/23 2110 08/22/23 0428  NA 132* 130*  K 4.4 4.5  CL 99 99  CO2 21* 22  GLUCOSE 112* 110*  BUN 25* 25*  CREATININE 1.60* 1.40*  CALCIUM 9.2 9.0  MG  --  2.0  PHOS  --  2.6   Liver Function Tests: Recent Labs  Lab 08/21/23 2110 08/22/23 0428  AST 57* 65*  ALT 31 34  ALKPHOS 60 56  BILITOT 1.2 1.6*  PROT 7.8 7.6  ALBUMIN 4.0 4.0   No results for input(s): "LIPASE", "AMYLASE" in the last 168 hours. No results for input(s): "AMMONIA" in the last 168 hours. Coagulation Profile: No results for input(s): "INR", "PROTIME" in the last 168 hours. CBC: Recent Labs  Lab 08/21/23 2110  WBC 3.0*  NEUTROABS 2.1  HGB 13.4  HCT 39.0  MCV 92.2  PLT 85*   Cardiac Enzymes: No results for input(s): "CKTOTAL", "CKMB", "CKMBINDEX", "TROPONINI" in the last 168  hours. BNP: Invalid input(s): "POCBNP" CBG: No results for input(s): "GLUCAP" in the last 168 hours. HbA1C: No results for input(s): "HGBA1C" in the last 72 hours. Urine analysis:    Component Value Date/Time   COLORURINE YELLOW 08/21/2023 2156   APPEARANCEUR HAZY (A) 08/21/2023 2156   APPEARANCEUR Clear 11/26/2022 0833   LABSPEC 1.016 08/21/2023 2156   PHURINE 6.0 08/21/2023 2156   GLUCOSEU NEGATIVE 08/21/2023 2156   HGBUR SMALL (A) 08/21/2023 2156   BILIRUBINUR NEGATIVE 08/21/2023 2156   BILIRUBINUR Negative 11/26/2022 0833   KETONESUR NEGATIVE 08/21/2023 2156   PROTEINUR 30 (A) 08/21/2023 2156   NITRITE NEGATIVE 08/21/2023 2156   LEUKOCYTESUR LARGE (A) 08/21/2023 2156   Sepsis Labs: @LABRCNTIP (procalcitonin:4,lacticidven:4) ) Recent Results (from the past 240 hours)  Resp panel by RT-PCR (RSV, Flu A&B, Covid) Anterior Nasal Swab     Status: None   Collection Time: 08/21/23  8:40 PM   Specimen: Anterior Nasal Swab  Result Value Ref Range Status   SARS Coronavirus 2 by RT PCR NEGATIVE NEGATIVE Final    Comment: (NOTE) SARS-CoV-2 target nucleic acids are NOT DETECTED.  The SARS-CoV-2 RNA is generally detectable in upper respiratory specimens during the acute phase of infection. The lowest concentration of SARS-CoV-2 viral copies this assay can detect is 138  copies/mL. A negative result does not preclude SARS-Cov-2 infection and should not be used as the sole basis for treatment or other patient management decisions. A negative result may occur with  improper specimen collection/handling, submission of specimen other than nasopharyngeal swab, presence of viral mutation(s) within the areas targeted by this assay, and inadequate number of viral copies(<138 copies/mL). A negative result must be combined with clinical observations, patient history, and epidemiological information. The expected result is Negative.  Fact Sheet for Patients:   BloggerCourse.com  Fact Sheet for Healthcare Providers:  SeriousBroker.it  This test is no t yet approved or cleared by the [place] FDA and  has been authorized for detection and/or diagnosis of SARS-CoV-2 by FDA under an Emergency Use Authorization (EUA). This EUA will remain  in effect (meaning this test can be used) for the duration of the COVID-19 declaration under Section 564(b)(1) of the Act, 21 U.S.C.section 360bbb-3(b)(1), unless the authorization is terminated  or revoked sooner.       Influenza A by PCR NEGATIVE NEGATIVE Final   Influenza B by PCR NEGATIVE NEGATIVE Final    Comment: (NOTE) The Xpert Xpress SARS-CoV-2/FLU/RSV plus assay is intended as an aid in the diagnosis of influenza from Nasopharyngeal swab specimens and should not be used as a sole basis for treatment. Nasal washings and aspirates are unacceptable for Xpert Xpress SARS-CoV-2/FLU/RSV testing.  Fact Sheet for Patients: BloggerCourse.com  Fact Sheet for Healthcare Providers: SeriousBroker.it  This test is not yet approved or cleared by the [place] FDA and has been authorized for detection and/or diagnosis of SARS-CoV-2 by FDA under an Emergency Use Authorization (EUA). This EUA will remain in effect (meaning this test can be used) for the duration of the COVID-19 declaration under Section 564(b)(1) of the Act, 21 U.S.C. section 360bbb-3(b)(1), unless the authorization is terminated or revoked.     Resp Syncytial Virus by PCR NEGATIVE NEGATIVE Final    Comment: (NOTE) Fact Sheet for Patients: BloggerCourse.com  Fact Sheet for Healthcare Providers: SeriousBroker.it  This test is not yet approved or cleared by the [place] FDA and has been authorized for detection and/or diagnosis of SARS-CoV-2 by FDA under an Emergency Use  Authorization (EUA). This EUA will remain in effect (meaning this test can be used) for the duration of the COVID-19 declaration under Section 564(b)(1) of the Act, 21 U.S.C. section 360bbb-3(b)(1), unless the authorization is terminated or revoked.  Performed at [facility], [address], [place], [place] [address]   Blood culture (routine x 2)     Status: None (Preliminary result)   Collection Time: 08/21/23 10:45 PM   Specimen: BLOOD RIGHT HAND  Result Value Ref Range Status   Specimen Description BLOOD RIGHT HAND  Final   Special Requests   Final    BOTTLES DRAWN AEROBIC AND ANAEROBIC Blood Culture adequate volume   Culture   Final    NO GROWTH < 12 HOURS Performed at [facility], [address], [place], [place] [address]    Report Status PENDING  Incomplete  Blood culture (routine x 2)     Status: None (Preliminary result)   Collection Time: 08/21/23 11:45 PM   Specimen: Left Antecubital; Blood  Result Value Ref Range Status   Specimen Description LEFT ANTECUBITAL  Final   Special Requests   Final    BOTTLES DRAWN AEROBIC AND ANAEROBIC Blood Culture adequate volume   Culture   Final    NO GROWTH < 12 HOURS Performed at [facility], [address], [place], [place] [address]    Report Status PENDING  Incomplete     Scheduled Meds:  atorvastatin  10 mg Oral Daily   levothyroxine  112 mcg Oral QAC breakfast   losartan  25 mg Oral Daily   metoprolol succinate  25 mg Oral Daily   Continuous Infusions:  meropenem (MERREM) IV      Procedures/Studies: DG Chest 2 View Result Date: 08/21/2023 CLINICAL DATA:  r/o PNA EXAM: CHEST - 2 VIEW COMPARISON:  Chest x-ray 05/29/2023. FINDINGS: Left chest wall triple lead cardiac pacemaker/defibrillator. The heart and mediastinal contours are unchanged. Atherosclerotic plaque. Similar pericentimeter round density overlying the right lower lung zone suggestive of a nipple shadow. No focal consolidation. No pulmonary edema. No pleural  effusion. No pneumothorax. No acute osseous abnormality. Bilateral total shoulder arthroplasty. IMPRESSION: 1. No active cardiopulmonary disease. 2.  Aortic Atherosclerosis (ICD10-I70.0). Electronically Signed   By: Tish Frederickson M.D.   On: 08/21/2023 21:30    Catarina Hartshorn, DO  [facility]  If 7PM-7AM, please contact night-coverage [email or web address] Password [email or web address] 08/22/2023, 8:02 AM   LOS: 1 day

## 2023-08-22 NOTE — Progress Notes (Signed)
 Responded to nursing call:  tachycardia   Subjective: Pt sitting on edge of bed eating lunch.  Denies headache, cp, sob, abd pain, vomiting.  He some nausea.  Vitals:   08/22/23 0500 08/22/23 0800 08/22/23 0942 08/22/23 1249  BP: (!) 117/52 (!) 117/52  109/63  Pulse: (!) 51 (!) 51 (!) 103 (!) 106  Resp: 18 18  18   Temp: 98.5 F (36.9 C) 98.5 F (36.9 C)  (!) 102.4 F (39.1 C)  TempSrc: Oral Oral  Oral  SpO2: 96%   94%  Weight:  95.4 kg    Height:  5\' 6"  (1.676 m)     CV--RRR Lung--fine bibasilar crackles. Abd--soft+BS/NT   Assessment/Plan: Fever/tachycardia -obtain EKG -pt having physiologic response to fever -repeat blood culture -lactic acid -PCT -acetaminophen -remain on telemetry -CT abd/pelvis--results pending -continue merrem -add vanc after blood cultures     Catarina Hartshorn, DO [facility]

## 2023-08-22 NOTE — Consult Note (Addendum)
 Pharmacy Antibiotic Note  ASSESSMENT: 78 y.o. male with PMH including ESBL bacteremia, bladder cancer s/p cystoprostatectomy with ileal conduit (10/2018), left nephrectomy (07/2019), pericarditis (05/2023), thyroid cancer s/p thyroidectomy (04/2023), CKD3a is presenting with sepsis. Patient presented to [facility] ED endorsing 2-day history of cough, fever, headache, dizziness. He is febrile, tachycardic, tachypneic, with low WBC count. His current renal function is at baseline (Scr 1.4-1.6). CTAP demonstrates cholelithiathis but not much acute. Pharmacy has been consulted to manage vancomycin dosing. He has also been placed on meropenem.  Patient measurements: Height: 5\' 6"  (167.6 cm) Weight: 95.4 kg (210 lb 5.1 oz) IBW/kg (Calculated) : 63.8  Vital signs: Temp: 100 F (37.8 C) (03/23 1404) Temp Source: Oral (03/23 1404) BP: 109/63 (03/23 1249) Pulse Rate: 106 (03/23 1249) Recent Labs  Lab 08/21/23 2110 08/22/23 0428 08/22/23 0812  WBC 3.0* 3.1* 2.9*  CREATININE 1.60* 1.40*  --    Estimated Creatinine Clearance: 49.3 mL/min (A) (by C-G formula based on SCr of 1.4 mg/dL (H)).  Allergies: Allergies  Allergen Reactions   Codeine Sulfate Other (See Comments)    Unknown     Antimicrobials this admission: Meropenem 3/23 >> Vancomycin 3/23 >>  Dose adjustments this admission: N/A  Microbiology results: 3/23 BCx: in process 3/22 BCx: NGTD  3/23 MRSA PCR: ordered 3/23 Respiratory panel PCR: negative  PLAN: Administer vancomycin 2000 mg IV x 1 as a loading dose, followed by vancomycin 1250 mg IV q24H thereafter eAUC 472, Cmax 30, Cmin 13 Scr 1.4, IBW, Vd 0.72 L/kg Follow up culture results to assess for antibiotic optimization. Monitor renal function to assess for any necessary antibiotic dosing changes.   Thank you for allowing pharmacy to be a part of this patient's care.  Will M. Dareen Piano, PharmD Clinical Pharmacist 08/22/2023 2:50 PM

## 2023-08-23 ENCOUNTER — Inpatient Hospital Stay (HOSPITAL_COMMUNITY)

## 2023-08-23 DIAGNOSIS — N3000 Acute cystitis without hematuria: Secondary | ICD-10-CM | POA: Diagnosis not present

## 2023-08-23 DIAGNOSIS — D696 Thrombocytopenia, unspecified: Secondary | ICD-10-CM | POA: Diagnosis not present

## 2023-08-23 DIAGNOSIS — N1831 Chronic kidney disease, stage 3a: Secondary | ICD-10-CM | POA: Diagnosis not present

## 2023-08-23 DIAGNOSIS — I5042 Chronic combined systolic (congestive) and diastolic (congestive) heart failure: Secondary | ICD-10-CM | POA: Diagnosis not present

## 2023-08-23 LAB — HEPATIC FUNCTION PANEL
ALT: 44 U/L (ref 0–44)
AST: 88 U/L — ABNORMAL HIGH (ref 15–41)
Albumin: 4 g/dL (ref 3.5–5.0)
Alkaline Phosphatase: 55 U/L (ref 38–126)
Bilirubin, Direct: 0.3 mg/dL — ABNORMAL HIGH (ref 0.0–0.2)
Indirect Bilirubin: 1.2 mg/dL — ABNORMAL HIGH (ref 0.3–0.9)
Total Bilirubin: 1.5 mg/dL — ABNORMAL HIGH (ref 0.0–1.2)
Total Protein: 8 g/dL (ref 6.5–8.1)

## 2023-08-23 LAB — MRSA NEXT GEN BY PCR, NASAL: MRSA by PCR Next Gen: NOT DETECTED

## 2023-08-23 LAB — CBC WITH DIFFERENTIAL/PLATELET
Abs Immature Granulocytes: 0.04 10*3/uL (ref 0.00–0.07)
Basophils Absolute: 0 10*3/uL (ref 0.0–0.1)
Basophils Relative: 1 %
Eosinophils Absolute: 0 10*3/uL (ref 0.0–0.5)
Eosinophils Relative: 0 %
HCT: 41.3 % (ref 39.0–52.0)
Hemoglobin: 14.2 g/dL (ref 13.0–17.0)
Immature Granulocytes: 1 %
Lymphocytes Relative: 16 %
Lymphs Abs: 0.6 10*3/uL — ABNORMAL LOW (ref 0.7–4.0)
MCH: 32.1 pg (ref 26.0–34.0)
MCHC: 34.4 g/dL (ref 30.0–36.0)
MCV: 93.4 fL (ref 80.0–100.0)
Monocytes Absolute: 0.4 10*3/uL (ref 0.1–1.0)
Monocytes Relative: 11 %
Neutro Abs: 2.7 10*3/uL (ref 1.7–7.7)
Neutrophils Relative %: 71 %
Platelets: 56 10*3/uL — ABNORMAL LOW (ref 150–400)
RBC: 4.42 MIL/uL (ref 4.22–5.81)
RDW: 17.7 % — ABNORMAL HIGH (ref 11.5–15.5)
Smear Review: DECREASED
WBC: 3.8 10*3/uL — ABNORMAL LOW (ref 4.0–10.5)
nRBC: 0 % (ref 0.0–0.2)

## 2023-08-23 LAB — CREATININE, URINE, RANDOM: Creatinine, Urine: 108 mg/dL

## 2023-08-23 LAB — BASIC METABOLIC PANEL
Anion gap: 10 (ref 5–15)
BUN: 33 mg/dL — ABNORMAL HIGH (ref 8–23)
CO2: 22 mmol/L (ref 22–32)
Calcium: 8.9 mg/dL (ref 8.9–10.3)
Chloride: 98 mmol/L (ref 98–111)
Creatinine, Ser: 1.67 mg/dL — ABNORMAL HIGH (ref 0.61–1.24)
GFR, Estimated: 42 mL/min — ABNORMAL LOW (ref >60)
Glucose, Bld: 108 mg/dL — ABNORMAL HIGH (ref 70–99)
Potassium: 4.6 mmol/L (ref 3.5–5.1)
Sodium: 130 mmol/L — ABNORMAL LOW (ref 135–145)

## 2023-08-23 LAB — MAGNESIUM: Magnesium: 2.4 mg/dL (ref 1.7–2.4)

## 2023-08-23 LAB — URINE CULTURE

## 2023-08-23 LAB — OSMOLALITY, URINE: Osmolality, Ur: 420 mosm/kg (ref 300–900)

## 2023-08-23 LAB — SODIUM, URINE, RANDOM: Sodium, Ur: 50 mmol/L

## 2023-08-23 NOTE — Plan of Care (Signed)

## 2023-08-23 NOTE — Discharge Instructions (Signed)
Rent/Utilities/Housing Agency Name: [facility] [employer] Address: [address] [address] [place], [place] [address] [address]  [place], [place] [address] P Phone Number: [phone] or [phone] For the hearing-impaired - Dial 711 for [employer]  Agency Name: [employer]. Dept. of Health and Human Services Address: [address], [place], [place] [address]  Phone: [phone]  Website: www.co.[place].us Services Offered: Temporary financial assistance, subsidized housing, and utility  assistance  Agency Name: [employer] Address: [address] [place], [place] [address]  Phone: [phone] ext. [phone] Email: Contact: info@newrha .org Website: FootballPromos.co.nz Services Offered: Subsidized apartment rent based on income.  Agency Name: [facility]  Address: [facility], [address]. [place], [place]  Phone: [phone]  Website: www.ccmeden.org Services Offered: Museum/gallery curator, TEFL teacher Technical brewer for all of  [address], [employer], [employer], Office manager  and [employer] for [employer] area only), rent assistance.  Agency Name: [employer] [facility] Address: [address] [place], [place], [place] [address] / [address], [place]  September 21, 2022 13 Phone: [phone] [place] / [phone] [place]  Website: OpinionTrades.tn NetworkAffair.co.za Services Offered: Civil Service fast streamer, food, showers, hygiene products utility payment  assistance, thrift shops, rental assistance, Support Groups

## 2023-08-23 NOTE — Progress Notes (Addendum)
 PROGRESS NOTE  Billy Rasmussen ZOX:[id number] DOB: 1945/06/12 DOA: 08/21/2023 PCP: Raliegh Ip, DO  Brief History:  78 year old male with history of bladder cancer status post cystoprostatectomy  with ileal conduit 10/2018, left nephrectomy (07/2019),  HFrEF (09/2022, LVEF was <20% ) s/p CRT-D (01/2023), nonobstructive CAD  LBBB, prior LV thrombus, pericarditis 05/2023, hypertension, HLD, CKD stage IIIa, thyroid cancer status post thyroidectomy (04/2023) presenting with 2-day history of generalized weakness, fevers, chills, myalgias, and arthralgias.  He states that his symptoms have worsened over the past 2 days.  He had a temperature 102.2 F at home.  Review of medical record shows that the patient was recently seen by PCP on 08/18/2023 and placed on azithromycin for URI type symptoms.  Patient was recently seen by his PCP on 08/18/2023.  He was placed on azithromycin at that time for URI type symptoms.  Notably, the patient had a hospital admission to [facility] on 05/19/2023 to 05/22/2023.  At that time he presented as a transfer from [facility] because of concerns for aortitis versus other rheumatologic condition/vasculitis.  At that time, the patient had been diagnosed with pericarditis but continued to have chest pain despite aspirin and colchicine.  CT imaging at [facility] was independently reviewed by [facility] team and we did not see any concerns for aortic pathology. His CRT device was interrogated and demonstrated no events and he was BiV pacing 98%. As his symptoms were felt consistent with pericarditis, he was restarted on aspirin and colchicine as well as pain control and his symptoms improved. Corticosteroids were held given concern for worsening volume retention and HFrEF. He was eval by rheumatology, and an infectious workup was negative. Marland Kitchen He was also seen by ophthalmology who were less concern for GCA but recommended he have updated prescription for glasses outpatient.   After  the patient was admitted, he was started on Merrem.  Urine cultures and blood cultures were obtained.  CT of the abdomen pelvis did not show any acute abnormalities in the right, particularly no acute abnormalities in the right kidney.  The patient continued to persistent fevers.  Vancomycin was added.  CT of the chest was obtained.  The patient was transferred to SDU for closer monitoring secondary to his tachycardia and fevers.   Assessment/Plan: Fever/pyuria -Patient has had a complex urologic history with complex urologic anatomy status post left nephrectomy 07/2019 secondary to recurrent pyelonephritis and ureteral stricture -Multiple previous MDR organisms including ESBL Klebsiella, Enterococcus, Morganella, and Pseudomonas -Continue empiric meropenem for now -Follow blood and urine culture--remain neg as of this am -CT abd/pelvis--no acute findings -COVID/RSV/Flu--neg -check viral resp panel--neg -added vanco -CT chest  CKD stage IIIa -Baseline creatinine 1.3-1.6   Chronic HFrEF -05/19/2023 echo EF 25 to 30%, grade 1 DD, mild decreased RVF -Clinically euvolemic -Continue metoprolol succinate -holding losartan and spironolactone -Not on SGLT2 secondary to recurrent infections -BiV ICD insertion with Dr. Nelly Laurence on 03/01/2023.    Bladder cancer -Status post cystoprostatectomy with ileal conduit -Status post left nephrectomy secondary recurrent pyelonephritis -Follow-up Dr. Berneice Heinrich   Thyroid cancer -Status post thyroidectomy 04/2023 -follow up Dr. Pollyann Kennedy   Thrombocytopenia -Patient has had a history of thrombocytopenia in the past and follows Dr. Shirline Frees -It was felt to be secondary to splenomegaly -Acute worsening secondary to infectious process -Monitor for signs of bleeding   Essential hypertension -Controlled -Continue metoprolol succinate and losartan   Coronary artery disease -nonobstructive -LHC on 10/09/2022 showed nonobstructive  CAD with 30% RCA stenosis -No  chest pain presently -Continue statin   History of SVT -Currently stable with sinus rhythm -Zio patch x 10 days 10/2022 showed 1 episode of NSVT lasting 8 beats, 10 episodes of SVT with longest lasting 19 beats.    Aortic aneurysm:  Ascending aortic aneurysm measuring 45 mm on CTPA 08/2022.    Hyponatremia -due to poor solute intake -urine osm -serum osm -urine Na      Family Communication: no  Family at bedside   Consultants:  none   Code Status:  FULL    DVT Prophylaxis:  SCDs     Procedures: As Listed in Progress Note Above   Antibiotics: Merrem 3/22>>            Family Communication: no  Family at bedside  Consultants:  none  Code Status:  FULL / DNR  DVT Prophylaxis:  SCDs   Procedures: As Listed in Progress Note Above  Antibiotics: Merrem 3/22>> Vanc 3/23>>     Subjective: Patient sitting.  He denies any chest pain or shortness of breath.  He has no vomiting.  His abdominal pain is better.  He denies any diarrhea.  There is no headache or neck pain.  Objective: Vitals:   08/23/23 0400 08/23/23 0430 08/23/23 0500 08/23/23 0552  BP: (!) 105/54 116/60 138/63 121/78  Pulse: 89 88 93 (!) 101  Resp: (!) 22 (!) 21 (!) 28 (!) 32  Temp:    99.3 F (37.4 C)  TempSrc:    Oral  SpO2: 98% 100% 100% 94%  Weight:      Height:        Intake/Output Summary (Last 24 hours) at 08/23/2023 0755 Last data filed at 08/23/2023 0500 Gross per 24 hour  Intake 253.21 ml  Output 1900 ml  Net -1646.79 ml   Weight change: 0 kg Exam:  General:  Pt is alert, follows commands appropriately, not in acute distress HEENT: No icterus, No thrush, No neck mass, [place]/AT Cardiovascular: RRR, S1/S2, no rubs, no gallops Respiratory: CTA bilaterally, no wheezing, no crackles, no rhonchi Abdomen: Soft/+BS, non tender, non distended, no guarding Extremities: No edema, No lymphangitis, No petechiae, No rashes, no synovitis   Data Reviewed: I have personally reviewed  following labs and imaging studies Basic Metabolic Panel: Recent Labs  Lab 08/21/23 2110 08/22/23 0428 08/23/23 0423  NA 132* 130* 130*  K 4.4 4.5 4.6  CL 99 99 98  CO2 21* 22 22  GLUCOSE 112* 110* 108*  BUN 25* 25* 33*  CREATININE 1.60* 1.40* 1.67*  CALCIUM 9.2 9.0 8.9  MG  --  2.0 2.4  PHOS  --  2.6  --    Liver Function Tests: Recent Labs  Lab 08/21/23 2110 08/22/23 0428 08/23/23 0423  AST 57* 65* 88*  ALT 31 34 44  ALKPHOS 60 56 55  BILITOT 1.2 1.6* 1.5*  PROT 7.8 7.6 8.0  ALBUMIN 4.0 4.0 4.0   No results for input(s): "LIPASE", "AMYLASE" in the last 168 hours. No results for input(s): "AMMONIA" in the last 168 hours. Coagulation Profile: No results for input(s): "INR", "PROTIME" in the last 168 hours. CBC: Recent Labs  Lab 08/21/23 2110 08/22/23 0428 08/22/23 0812 08/23/23 0423  WBC 3.0* 3.1* 2.9* 3.8*  NEUTROABS 2.1  --   --  2.7  HGB 13.4 13.2 13.0 14.2  HCT 39.0 37.6* 37.1* 41.3  MCV 92.2 91.3 90.7 93.4  PLT 85* 80* 75* 56*   Cardiac Enzymes: No  results for input(s): "CKTOTAL", "CKMB", "CKMBINDEX", "TROPONINI" in the last 168 hours. BNP: Invalid input(s): "POCBNP" CBG: No results for input(s): "GLUCAP" in the last 168 hours. HbA1C: No results for input(s): "HGBA1C" in the last 72 hours. Urine analysis:    Component Value Date/Time   COLORURINE YELLOW 08/21/2023 2156   APPEARANCEUR HAZY (A) 08/21/2023 2156   APPEARANCEUR Clear 11/26/2022 0833   LABSPEC 1.016 08/21/2023 2156   PHURINE 6.0 08/21/2023 2156   GLUCOSEU NEGATIVE 08/21/2023 2156   HGBUR SMALL (A) 08/21/2023 2156   BILIRUBINUR NEGATIVE 08/21/2023 2156   BILIRUBINUR Negative 11/26/2022 0833   KETONESUR NEGATIVE 08/21/2023 2156   PROTEINUR 30 (A) 08/21/2023 2156   NITRITE NEGATIVE 08/21/2023 2156   LEUKOCYTESUR LARGE (A) 08/21/2023 2156   Sepsis Labs: @LABRCNTIP (procalcitonin:4,lacticidven:4) ) Recent Results (from the past 240 hours)  Resp panel by RT-PCR (RSV, Flu A&B,  Covid) Anterior Nasal Swab     Status: None   Collection Time: 08/21/23  8:40 PM   Specimen: Anterior Nasal Swab  Result Value Ref Range Status   SARS Coronavirus 2 by RT PCR NEGATIVE NEGATIVE Final    Comment: (NOTE) SARS-CoV-2 target nucleic acids are NOT DETECTED.  The SARS-CoV-2 RNA is generally detectable in upper respiratory specimens during the acute phase of infection. The lowest concentration of SARS-CoV-2 viral copies this assay can detect is 138 copies/mL. A negative result does not preclude SARS-Cov-2 infection and should not be used as the sole basis for treatment or other patient management decisions. A negative result may occur with  improper specimen collection/handling, submission of specimen other than nasopharyngeal swab, presence of viral mutation(s) within the areas targeted by this assay, and inadequate number of viral copies(<138 copies/mL). A negative result must be combined with clinical observations, patient history, and epidemiological information. The expected result is Negative.  Fact Sheet for Patients:  BloggerCourse.com  Fact Sheet for Healthcare Providers:  SeriousBroker.it  This test is no t yet approved or cleared by the [place] FDA and  has been authorized for detection and/or diagnosis of SARS-CoV-2 by FDA under an Emergency Use Authorization (EUA). This EUA will remain  in effect (meaning this test can be used) for the duration of the COVID-19 declaration under Section 564(b)(1) of the Act, 21 U.S.C.section 360bbb-3(b)(1), unless the authorization is terminated  or revoked sooner.       Influenza A by PCR NEGATIVE NEGATIVE Final   Influenza B by PCR NEGATIVE NEGATIVE Final    Comment: (NOTE) The Xpert Xpress SARS-CoV-2/FLU/RSV plus assay is intended as an aid in the diagnosis of influenza from Nasopharyngeal swab specimens and should not be used as a sole basis for treatment. Nasal  washings and aspirates are unacceptable for Xpert Xpress SARS-CoV-2/FLU/RSV testing.  Fact Sheet for Patients: BloggerCourse.com  Fact Sheet for Healthcare Providers: SeriousBroker.it  This test is not yet approved or cleared by the [place] FDA and has been authorized for detection and/or diagnosis of SARS-CoV-2 by FDA under an Emergency Use Authorization (EUA). This EUA will remain in effect (meaning this test can be used) for the duration of the COVID-19 declaration under Section 564(b)(1) of the Act, 21 U.S.C. section 360bbb-3(b)(1), unless the authorization is terminated or revoked.     Resp Syncytial Virus by PCR NEGATIVE NEGATIVE Final    Comment: (NOTE) Fact Sheet for Patients: BloggerCourse.com  Fact Sheet for Healthcare Providers: SeriousBroker.it  This test is not yet approved or cleared by the [place] FDA and has been authorized for detection and/or  diagnosis of SARS-CoV-2 by FDA under an Emergency Use Authorization (EUA). This EUA will remain in effect (meaning this test can be used) for the duration of the COVID-19 declaration under Section 564(b)(1) of the Act, 21 U.S.C. section 360bbb-3(b)(1), unless the authorization is terminated or revoked.  Performed at [facility], [address], [place], [place] [address]   Blood culture (routine x 2)     Status: None (Preliminary result)   Collection Time: 08/21/23 10:45 PM   Specimen: BLOOD RIGHT HAND  Result Value Ref Range Status   Specimen Description BLOOD RIGHT HAND  Final   Special Requests   Final    BOTTLES DRAWN AEROBIC AND ANAEROBIC Blood Culture adequate volume   Culture   Final    NO GROWTH 2 DAYS Performed at [facility], [address], [place], [place] [address]    Report Status PENDING  Incomplete  Blood culture (routine x 2)     Status: None (Preliminary result)   Collection Time:  08/21/23 11:45 PM   Specimen: Left Antecubital; Blood  Result Value Ref Range Status   Specimen Description LEFT ANTECUBITAL  Final   Special Requests   Final    BOTTLES DRAWN AEROBIC AND ANAEROBIC Blood Culture adequate volume   Culture   Final    NO GROWTH 2 DAYS Performed at [facility], [address] [place], [place] [address]    Report Status PENDING  Incomplete  Respiratory (~20 pathogens) panel by PCR     Status: None   Collection Time: 08/22/23  8:00 AM   Specimen: Nasopharyngeal Swab; Respiratory  Result Value Ref Range Status   Adenovirus NOT DETECTED NOT DETECTED Final   Coronavirus 229E NOT DETECTED NOT DETECTED Final    Comment: (NOTE) The Coronavirus on the Respiratory Panel, DOES NOT test for the novel  Coronavirus (2019 nCoV)    Coronavirus HKU1 NOT DETECTED NOT DETECTED Final   Coronavirus NL63 NOT DETECTED NOT DETECTED Final   Coronavirus OC43 NOT DETECTED NOT DETECTED Final   Metapneumovirus NOT DETECTED NOT DETECTED Final   Rhinovirus / Enterovirus NOT DETECTED NOT DETECTED Final   Influenza A NOT DETECTED NOT DETECTED Final   Influenza B NOT DETECTED NOT DETECTED Final   Parainfluenza Virus 1 NOT DETECTED NOT DETECTED Final   Parainfluenza Virus 2 NOT DETECTED NOT DETECTED Final   Parainfluenza Virus 3 NOT DETECTED NOT DETECTED Final   Parainfluenza Virus 4 NOT DETECTED NOT DETECTED Final   Respiratory Syncytial Virus NOT DETECTED NOT DETECTED Final   Bordetella pertussis NOT DETECTED NOT DETECTED Final   Bordetella Parapertussis NOT DETECTED NOT DETECTED Final   Chlamydophila pneumoniae NOT DETECTED NOT DETECTED Final   Mycoplasma pneumoniae NOT DETECTED NOT DETECTED Final    Comment: Performed at [facility], [address], [place], [place] [address]  Culture, blood (Routine X 2) w Reflex to ID Panel     Status: None (Preliminary result)   Collection Time: 08/22/23  1:57 PM   Specimen: BLOOD RIGHT HAND  Result Value Ref Range Status    Specimen Description   Final    BLOOD RIGHT HAND BOTTLES DRAWN AEROBIC AND ANAEROBIC   Special Requests Blood Culture adequate volume  Final   Culture   Final    NO GROWTH < 24 HOURS Performed at [facility], [address], [place], [place] [address]    Report Status PENDING  Incomplete  Culture, blood (Routine X 2) w Reflex to ID Panel  Status: None (Preliminary result)   Collection Time: 08/22/23  2:40 PM   Specimen: BLOOD  Result Value Ref Range Status   Specimen Description BLOOD LEFT ANTECUBITAL  Final   Special Requests   Final    BOTTLES DRAWN AEROBIC AND ANAEROBIC Blood Culture adequate volume   Culture   Final    NO GROWTH < 24 HOURS Performed at [facility], [address] [place], [place] [address]    Report Status PENDING  Incomplete  MRSA Next Gen by PCR, Nasal     Status: None   Collection Time: 08/22/23 10:10 PM   Specimen: Nasal Mucosa; Nasal Swab  Result Value Ref Range Status   MRSA by PCR Next Gen NOT DETECTED NOT DETECTED Final    Comment: (NOTE) The GeneXpert MRSA Assay (FDA approved for NASAL specimens only), is one component of a comprehensive MRSA colonization surveillance program. It is not intended to diagnose MRSA infection nor to guide or monitor treatment for MRSA infections. Test performance is not FDA approved in patients less than 27 years old. Performed at [facility], [address], [place], [place] [address]      Scheduled Meds:  atorvastatin  10 mg Oral Daily   Chlorhexidine Gluconate Cloth  6 each Topical Daily   levothyroxine  112 mcg Oral QAC breakfast   metoprolol succinate  50 mg Oral Daily   Continuous Infusions:  meropenem (MERREM) IV 1 g (08/22/23 2226)   vancomycin      Procedures/Studies: CT ABDOMEN PELVIS WO CONTRAST Result Date: 08/22/2023 CLINICAL DATA:  Right-sided abdominal pain and fever. EXAM: CT ABDOMEN AND PELVIS WITHOUT CONTRAST TECHNIQUE: Multidetector CT imaging of the abdomen and pelvis was performed  following the standard protocol without IV contrast. RADIATION DOSE REDUCTION: This exam was performed according to the departmental dose-optimization program which includes automated exposure control, adjustment of the mA and/or kV according to patient size and/or use of iterative reconstruction technique. COMPARISON:  05/19/2023 and MR abdomen 02/18/2023. FINDINGS: Lower chest: Minimal mucoid impaction in the right lower lobe. Mild dependent atelectasis. Heart is enlarged. No pericardial or pleural effusion. Distal esophagus is grossly unremarkable. Hepatobiliary: Liver is grossly unremarkable. Gallstones. No biliary ductal dilatation. Pancreas: Negative. Spleen: Negative. Adrenals/Urinary Tract: Right adrenal gland and right kidney are grossly unremarkable. Right ureter is decompressed. Left adrenalectomy and left nephrectomy. Cysto prostatectomy with a right lower quadrant ileal conduit. Stomach/Bowel: Right ileal conduit formation. Small bowel, right inguinal hernia contains unobstructed small bowel. Stomach, small bowel and colon are otherwise unremarkable. Vascular/Lymphatic: Atherosclerotic calcification of the aorta. No pathologically enlarged lymph nodes. Reproductive: Prostatectomy. Other: Right inguinal hernia contains unobstructed small bowel. Left inguinal hernia contains fat. Mesenteries and peritoneum are unremarkable. Musculoskeletal: Degenerative changes in the spine. IMPRESSION: 1. No acute findings to explain the patient's pain or fever. 2. Cholelithiasis. 3. Cysto prostatectomy with a right lower quadrant ileal conduit. 4. Right inguinal hernia contains unobstructed small bowel. 5.  Aortic atherosclerosis (ICD10-I70.0). Electronically Signed   By: Leanna Battles M.D.   On: 08/22/2023 14:49   DG Chest 2 View Result Date: 08/21/2023 CLINICAL DATA:  r/o PNA EXAM: CHEST - 2 VIEW COMPARISON:  Chest x-ray 05/29/2023. FINDINGS: Left chest wall triple lead cardiac pacemaker/defibrillator. The heart  and mediastinal contours are unchanged. Atherosclerotic plaque. Similar pericentimeter round density overlying the right lower lung zone suggestive of a nipple shadow. No focal consolidation. No pulmonary edema. No pleural effusion. No pneumothorax. No acute osseous abnormality. Bilateral total shoulder arthroplasty. IMPRESSION: 1. No active  cardiopulmonary disease. 2.  Aortic Atherosclerosis (ICD10-I70.0). Electronically Signed   By: Tish Frederickson M.D.   On: 08/21/2023 21:30    Catarina Hartshorn, DO  [facility]  If 7PM-7AM, please contact night-coverage [email or web address] Password [facility] 08/23/2023, 7:55 AM   LOS: 2 days

## 2023-08-23 NOTE — Care Management Important Message (Signed)
 Important Message  Patient Details  Name: Billy Rasmussen MRN: [id number] Date of Birth: [birth year]   Important Message Given:  N/A - LOS <3 / Initial given by admissions     Corey Harold 08/23/2023, 11:56 AM

## 2023-08-24 ENCOUNTER — Ambulatory Visit (HOSPITAL_COMMUNITY): Payer: Medicare Other

## 2023-08-24 DIAGNOSIS — I5042 Chronic combined systolic (congestive) and diastolic (congestive) heart failure: Secondary | ICD-10-CM | POA: Diagnosis not present

## 2023-08-24 DIAGNOSIS — D696 Thrombocytopenia, unspecified: Secondary | ICD-10-CM | POA: Diagnosis not present

## 2023-08-24 DIAGNOSIS — N3000 Acute cystitis without hematuria: Secondary | ICD-10-CM | POA: Diagnosis not present

## 2023-08-24 LAB — CBC WITH DIFFERENTIAL/PLATELET
Abs Immature Granulocytes: 0.01 10*3/uL (ref 0.00–0.07)
Basophils Absolute: 0 10*3/uL (ref 0.0–0.1)
Basophils Relative: 0 %
Eosinophils Absolute: 0 10*3/uL (ref 0.0–0.5)
Eosinophils Relative: 0 %
HCT: 35.5 % — ABNORMAL LOW (ref 39.0–52.0)
Hemoglobin: 12.3 g/dL — ABNORMAL LOW (ref 13.0–17.0)
Immature Granulocytes: 0 %
Lymphocytes Relative: 11 %
Lymphs Abs: 0.3 10*3/uL — ABNORMAL LOW (ref 0.7–4.0)
MCH: 31.4 pg (ref 26.0–34.0)
MCHC: 34.6 g/dL (ref 30.0–36.0)
MCV: 90.6 fL (ref 80.0–100.0)
Monocytes Absolute: 0.2 10*3/uL (ref 0.1–1.0)
Monocytes Relative: 8 %
Neutro Abs: 2.1 10*3/uL (ref 1.7–7.7)
Neutrophils Relative %: 81 %
Platelets: 50 10*3/uL — ABNORMAL LOW (ref 150–400)
RBC: 3.92 MIL/uL — ABNORMAL LOW (ref 4.22–5.81)
RDW: 17.4 % — ABNORMAL HIGH (ref 11.5–15.5)
Smear Review: DECREASED
WBC: 2.6 10*3/uL — ABNORMAL LOW (ref 4.0–10.5)
nRBC: 0 % (ref 0.0–0.2)

## 2023-08-24 LAB — OSMOLALITY: Osmolality: 292 mosm/kg (ref 275–295)

## 2023-08-24 LAB — COMPREHENSIVE METABOLIC PANEL
ALT: 50 U/L — ABNORMAL HIGH (ref 0–44)
AST: 106 U/L — ABNORMAL HIGH (ref 15–41)
Albumin: 3.6 g/dL (ref 3.5–5.0)
Alkaline Phosphatase: 50 U/L (ref 38–126)
Anion gap: 9 (ref 5–15)
BUN: 37 mg/dL — ABNORMAL HIGH (ref 8–23)
CO2: 21 mmol/L — ABNORMAL LOW (ref 22–32)
Calcium: 8.7 mg/dL — ABNORMAL LOW (ref 8.9–10.3)
Chloride: 99 mmol/L (ref 98–111)
Creatinine, Ser: 1.83 mg/dL — ABNORMAL HIGH (ref 0.61–1.24)
GFR, Estimated: 38 mL/min — ABNORMAL LOW (ref >60)
Glucose, Bld: 113 mg/dL — ABNORMAL HIGH (ref 70–99)
Potassium: 4.5 mmol/L (ref 3.5–5.1)
Sodium: 129 mmol/L — ABNORMAL LOW (ref 135–145)
Total Bilirubin: 1.4 mg/dL — ABNORMAL HIGH (ref 0.0–1.2)
Total Protein: 7.1 g/dL (ref 6.5–8.1)

## 2023-08-24 MED ORDER — DOXYCYCLINE HYCLATE 100 MG PO TABS
100.0000 mg | ORAL_TABLET | Freq: Two times a day (BID) | ORAL | Status: DC
  Administered 2023-08-24 – 2023-08-26 (×4): 100 mg via ORAL
  Filled 2023-08-24 (×4): qty 1

## 2023-08-24 NOTE — Progress Notes (Addendum)
 PROGRESS NOTE  JVEON POUND NWG:[id number] DOB: 05/08/1946 DOA: 08/21/2023 PCP: Raliegh Ip, DO  Brief History:  78 year old male with history of bladder cancer status post cystoprostatectomy  with ileal conduit 10/2018, left nephrectomy (07/2019),  HFrEF (09/2022, LVEF was <20% ) s/p CRT-D (01/2023), nonobstructive CAD  LBBB, prior LV thrombus, pericarditis 05/2023, hypertension, HLD, CKD stage IIIa, thyroid cancer status post thyroidectomy (04/2023) presenting with 2-day history of generalized weakness, fevers, chills, myalgias, and arthralgias.  He states that his symptoms have worsened over the past 2 days.  He had a temperature 102.2 F at home.  Review of medical record shows that the patient was recently seen by PCP on 08/18/2023 and placed on azithromycin for URI type symptoms.  Patient was recently seen by his PCP on 08/18/2023.  He was placed on azithromycin at that time for URI type symptoms.  Notably, the patient had a hospital admission to [facility] on 05/19/2023 to 05/22/2023.  At that time he presented as a transfer from [facility] because of concerns for aortitis versus other rheumatologic condition/vasculitis.  At that time, the patient had been diagnosed with pericarditis but continued to have chest pain despite aspirin and colchicine.  CT imaging at [facility] was independently reviewed by [facility] team and we did not see any concerns for aortic pathology. His CRT device was interrogated and demonstrated no events and he was BiV pacing 98%. As his symptoms were felt consistent with pericarditis, he was restarted on aspirin and colchicine as well as pain control and his symptoms improved. Corticosteroids were held given concern for worsening volume retention and HFrEF. He was eval by rheumatology, and an infectious workup was negative. Marland Kitchen He was also seen by ophthalmology who were less concern for GCA but recommended he have updated prescription for glasses outpatient.   After  the patient was admitted, he was started on Merrem.  Urine cultures and blood cultures were obtained.  CT of the abdomen pelvis did not show any acute abnormalities in the right kidney, particularly no acute abnormalities in the right kidney.  The patient continued to persistent fevers.  Vancomycin was added.  CT of the chest was obtained.  The patient was transferred to SDU for closer monitoring secondary to his tachycardia and fevers.  The patient's fevers persisted although they began trending down.  CT of the chest did not reveal any evidence of pneumonia, pulmonary function, or pericarditis.  On 08/24/2023, the patient's LFTs gradually trended up with progressive leukopenia.  This is suggestive of a tickborne infection.  The patient was empirically started on doxycycline.  Tickborne serologies were ordered. I later spoke with the patient and spouse who confirmed that the patient was recently bitten by tick 1 week prior to admission.   Assessment/Plan: Fever/pyuria/leukopenia -Patient has had a complex urologic history with complex urologic anatomy status post left nephrectomy 07/2019 secondary to recurrent pyelonephritis and ureteral stricture -Multiple previous MDR organisms including ESBL Klebsiella, Enterococcus, Morganella, and Pseudomonas -Continue empiric meropenem for now -Follow blood and urine culture--remain unrevealing -CT abd/pelvis--no acute findings -COVID/RSV/Flu--neg -check viral resp panel--neg -added vanco -CT chest--no pericarditis, no pneumonia -08/24/23--leukopenia, increase LFTs, thrombocytopenia--suggest tickborne infection>>>start doxy   CKD stage IIIa -Baseline creatinine 1.3-1.6  Hyponatremia -due to SIADH -urine osm--420 -serum osm--292 -urine Na  50 -fluid restrict  Transaminasemia -likely tickborne infection -RUQ [place] -hep B surface antigen -hep C antibody -no abdominal pain, tolerating diet   Chronic HFrEF -05/19/2023 echo  EF 25 to 30%, grade 1 DD,  mild decreased RVF -Clinically euvolemic -Continue metoprolol succinate -holding losartan and spironolactone -Not on SGLT2 secondary to recurrent infections -BiV ICD insertion with Dr. Nelly Laurence on 03/01/2023.    Bladder cancer -Status post cystoprostatectomy with ileal conduit -Status post left nephrectomy secondary recurrent pyelonephritis -Follow-up Dr. Berneice Heinrich   Thyroid cancer -Status post thyroidectomy 04/2023 -follow up Dr. Pollyann Kennedy   Thrombocytopenia -Patient has had a history of thrombocytopenia in the past and follows Dr. Shirline Frees -It was felt to be secondary to splenomegaly -Acute worsening secondary to infectious process -Monitor for signs of bleeding   Essential hypertension -Controlled -Continue metoprolol succinate and losartan   Coronary artery disease -nonobstructive -LHC on 10/09/2022 showed nonobstructive CAD with 30% RCA stenosis -No chest pain presently -Continue statin   History of SVT -Currently stable with sinus rhythm -Zio patch x 10 days 10/2022 showed 1 episode of NSVT lasting 8 beats, 10 episodes of SVT with longest lasting 19 beats.    Aortic aneurysm:  Ascending aortic aneurysm measuring 45 mm on CTPA 08/2022.     Family Communication: spouse updated 08/24/23   Consultants:  none   Code Status:  FULL    DVT Prophylaxis:  SCDs     Procedures: As Listed in Progress Note Above   Antibiotics: Merrem 3/22>> Doxy 3/25>> Vanco 3/23>>3/24           Subjective: Pt complains of generalized weakness. Denies f/c, cp, sob, n/v/d, abd pain  Objective: Vitals:   08/24/23 1400 08/24/23 1500 08/24/23 1610 08/24/23 1617  BP: (!) 131/42 (!) 120/39  104/88  Pulse: 64 (!) 32 63 98  Resp: (!) 26 (!) 28 (!) 29 (!) 30  Temp:   97.9 F (36.6 C)   TempSrc:   Oral   SpO2: 99% 94% 100% 95%  Weight:      Height:        Intake/Output Summary (Last 24 hours) at 08/24/2023 1708 Last data filed at 08/24/2023 0400 Gross per 24 hour  Intake --  Output  1340 ml  Net -1340 ml   Weight change:  Exam:  General:  Pt is alert, follows commands appropriately, not in acute distress HEENT: No icterus, No thrush, No neck mass, [place]/AT Cardiovascular: RRR, S1/S2, no rubs, no gallops Respiratory: CTA bilaterally, no wheezing, no crackles, no rhonchi Abdomen: Soft/+BS, non tender, non distended, no guarding Extremities: No edema, No lymphangitis, No petechiae, No rashes, no synovitis   Data Reviewed: I have personally reviewed following labs and imaging studies Basic Metabolic Panel: Recent Labs  Lab 08/21/23 2110 08/22/23 0428 08/23/23 0423 08/24/23 0451  NA 132* 130* 130* 129*  K 4.4 4.5 4.6 4.5  CL 99 99 98 99  CO2 21* 22 22 21*  GLUCOSE 112* 110* 108* 113*  BUN 25* 25* 33* 37*  CREATININE 1.60* 1.40* 1.67* 1.83*  CALCIUM 9.2 9.0 8.9 8.7*  MG  --  2.0 2.4  --   PHOS  --  2.6  --   --    Liver Function Tests: Recent Labs  Lab 08/21/23 2110 08/22/23 0428 08/23/23 0423 08/24/23 0451  AST 57* 65* 88* 106*  ALT 31 34 44 50*  ALKPHOS 60 56 55 50  BILITOT 1.2 1.6* 1.5* 1.4*  PROT 7.8 7.6 8.0 7.1  ALBUMIN 4.0 4.0 4.0 3.6   No results for input(s): "LIPASE", "AMYLASE" in the last 168 hours. No results for input(s): "AMMONIA" in the last 168 hours. Coagulation Profile: No results for  input(s): "INR", "PROTIME" in the last 168 hours. CBC: Recent Labs  Lab 08/21/23 2110 08/22/23 0428 08/22/23 0812 08/23/23 0423 08/24/23 0451  WBC 3.0* 3.1* 2.9* 3.8* 2.6*  NEUTROABS 2.1  --   --  2.7 2.1  HGB 13.4 13.2 13.0 14.2 12.3*  HCT 39.0 37.6* 37.1* 41.3 35.5*  MCV 92.2 91.3 90.7 93.4 90.6  PLT 85* 80* 75* 56* 50*   Cardiac Enzymes: No results for input(s): "CKTOTAL", "CKMB", "CKMBINDEX", "TROPONINI" in the last 168 hours. BNP: Invalid input(s): "POCBNP" CBG: No results for input(s): "GLUCAP" in the last 168 hours. HbA1C: No results for input(s): "HGBA1C" in the last 72 hours. Urine analysis:    Component Value Date/Time    COLORURINE YELLOW 08/21/2023 2156   APPEARANCEUR HAZY (A) 08/21/2023 2156   APPEARANCEUR Clear 11/26/2022 0833   LABSPEC 1.016 08/21/2023 2156   PHURINE 6.0 08/21/2023 2156   GLUCOSEU NEGATIVE 08/21/2023 2156   HGBUR SMALL (A) 08/21/2023 2156   BILIRUBINUR NEGATIVE 08/21/2023 2156   BILIRUBINUR Negative 11/26/2022 0833   KETONESUR NEGATIVE 08/21/2023 2156   PROTEINUR 30 (A) 08/21/2023 2156   NITRITE NEGATIVE 08/21/2023 2156   LEUKOCYTESUR LARGE (A) 08/21/2023 2156   Sepsis Labs: @LABRCNTIP (procalcitonin:4,lacticidven:4) ) Recent Results (from the past 240 hours)  Resp panel by RT-PCR (RSV, Flu A&B, Covid) Anterior Nasal Swab     Status: None   Collection Time: 08/21/23  8:40 PM   Specimen: Anterior Nasal Swab  Result Value Ref Range Status   SARS Coronavirus 2 by RT PCR NEGATIVE NEGATIVE Final    Comment: (NOTE) SARS-CoV-2 target nucleic acids are NOT DETECTED.  The SARS-CoV-2 RNA is generally detectable in upper respiratory specimens during the acute phase of infection. The lowest concentration of SARS-CoV-2 viral copies this assay can detect is 138 copies/mL. A negative result does not preclude SARS-Cov-2 infection and should not be used as the sole basis for treatment or other patient management decisions. A negative result may occur with  improper specimen collection/handling, submission of specimen other than nasopharyngeal swab, presence of viral mutation(s) within the areas targeted by this assay, and inadequate number of viral copies(<138 copies/mL). A negative result must be combined with clinical observations, patient history, and epidemiological information. The expected result is Negative.  Fact Sheet for Patients:  BloggerCourse.com  Fact Sheet for Healthcare Providers:  SeriousBroker.it  This test is no t yet approved or cleared by the [place] FDA and  has been authorized for detection and/or  diagnosis of SARS-CoV-2 by FDA under an Emergency Use Authorization (EUA). This EUA will remain  in effect (meaning this test can be used) for the duration of the COVID-19 declaration under Section 564(b)(1) of the Act, 21 U.S.C.section 360bbb-3(b)(1), unless the authorization is terminated  or revoked sooner.       Influenza A by PCR NEGATIVE NEGATIVE Final   Influenza B by PCR NEGATIVE NEGATIVE Final    Comment: (NOTE) The Xpert Xpress SARS-CoV-2/FLU/RSV plus assay is intended as an aid in the diagnosis of influenza from Nasopharyngeal swab specimens and should not be used as a sole basis for treatment. Nasal washings and aspirates are unacceptable for Xpert Xpress SARS-CoV-2/FLU/RSV testing.  Fact Sheet for Patients: BloggerCourse.com  Fact Sheet for Healthcare Providers: SeriousBroker.it  This test is not yet approved or cleared by the [place] FDA and has been authorized for detection and/or diagnosis of SARS-CoV-2 by FDA under an Emergency Use Authorization (EUA). This EUA will remain in effect (meaning this test can be used)  for the duration of the COVID-19 declaration under Section 564(b)(1) of the Act, 21 U.S.C. section 360bbb-3(b)(1), unless the authorization is terminated or revoked.     Resp Syncytial Virus by PCR NEGATIVE NEGATIVE Final    Comment: (NOTE) Fact Sheet for Patients: BloggerCourse.com  Fact Sheet for Healthcare Providers: SeriousBroker.it  This test is not yet approved or cleared by the [place] FDA and has been authorized for detection and/or diagnosis of SARS-CoV-2 by FDA under an Emergency Use Authorization (EUA). This EUA will remain in effect (meaning this test can be used) for the duration of the COVID-19 declaration under Section 564(b)(1) of the Act, 21 U.S.C. section 360bbb-3(b)(1), unless the authorization is terminated  or revoked.  Performed at [facility], [address] [place], [place] [address]   Urine Culture     Status: Abnormal   Collection Time: 08/21/23  9:56 PM   Specimen: Urine, Clean Catch  Result Value Ref Range Status   Specimen Description   Final    URINE, CLEAN CATCH Performed at [facility], [address] [place], [place] [address]    Special Requests   Final    NONE Performed at [facility], [address] [place], [place] [address]    Culture MULTIPLE SPECIES PRESENT, SUGGEST RECOLLECTION (A)  Final   Report Status 08/23/2023 FINAL  Final  Blood culture (routine x 2)     Status: None (Preliminary result)   Collection Time: 08/21/23 10:45 PM   Specimen: BLOOD RIGHT HAND  Result Value Ref Range Status   Specimen Description BLOOD RIGHT HAND  Final   Special Requests   Final    BOTTLES DRAWN AEROBIC AND ANAEROBIC Blood Culture adequate volume   Culture   Final    NO GROWTH 3 DAYS Performed at [facility], [address] [place], [place] [address]    Report Status PENDING  Incomplete  Blood culture (routine x 2)     Status: None (Preliminary result)   Collection Time: 08/21/23 11:45 PM   Specimen: Left Antecubital; Blood  Result Value Ref Range Status   Specimen Description LEFT ANTECUBITAL  Final   Special Requests   Final    BOTTLES DRAWN AEROBIC AND ANAEROBIC Blood Culture adequate volume   Culture   Final    NO GROWTH 3 DAYS Performed at [facility], [address], [place], [place] [address]    Report Status PENDING  Incomplete  Respiratory (~20 pathogens) panel by PCR     Status: None   Collection Time: 08/22/23  8:00 AM   Specimen: Nasopharyngeal Swab; Respiratory  Result Value Ref Range Status   Adenovirus NOT DETECTED NOT DETECTED Final   Coronavirus 229E NOT DETECTED NOT DETECTED Final    Comment: (NOTE) The Coronavirus on the Respiratory Panel, DOES NOT test for the novel  Coronavirus (2019 nCoV)    Coronavirus HKU1 NOT DETECTED NOT DETECTED Final    Coronavirus NL63 NOT DETECTED NOT DETECTED Final   Coronavirus OC43 NOT DETECTED NOT DETECTED Final   Metapneumovirus NOT DETECTED NOT DETECTED Final   Rhinovirus / Enterovirus NOT DETECTED NOT DETECTED Final   Influenza A NOT DETECTED NOT DETECTED Final   Influenza B NOT DETECTED NOT DETECTED Final   Parainfluenza Virus 1 NOT DETECTED NOT DETECTED Final   Parainfluenza Virus 2 NOT DETECTED NOT DETECTED Final   Parainfluenza Virus 3 NOT DETECTED NOT DETECTED Final   Parainfluenza Virus 4 NOT DETECTED NOT DETECTED Final   Respiratory Syncytial Virus NOT DETECTED NOT  DETECTED Final   Bordetella pertussis NOT DETECTED NOT DETECTED Final   Bordetella Parapertussis NOT DETECTED NOT DETECTED Final   Chlamydophila pneumoniae NOT DETECTED NOT DETECTED Final   Mycoplasma pneumoniae NOT DETECTED NOT DETECTED Final    Comment: Performed at [facility], [address] [place], [place] [address]  Culture, blood (Routine X 2) w Reflex to ID Panel     Status: None (Preliminary result)   Collection Time: 08/22/23  1:57 PM   Specimen: BLOOD RIGHT HAND  Result Value Ref Range Status   Specimen Description   Final    BLOOD RIGHT HAND BOTTLES DRAWN AEROBIC AND ANAEROBIC   Special Requests Blood Culture adequate volume  Final   Culture   Final    NO GROWTH 2 DAYS Performed at [facility], [address] [place], [place] [address]    Report Status PENDING  Incomplete  Culture, blood (Routine X 2) w Reflex to ID Panel     Status: None (Preliminary result)   Collection Time: 08/22/23  2:40 PM   Specimen: BLOOD  Result Value Ref Range Status   Specimen Description BLOOD LEFT ANTECUBITAL  Final   Special Requests   Final    BOTTLES DRAWN AEROBIC AND ANAEROBIC Blood Culture adequate volume   Culture   Final    NO GROWTH 2 DAYS Performed at [facility], [address], [place], [place] [address]    Report Status PENDING  Incomplete  MRSA Next Gen by PCR, Nasal     Status: None   Collection  Time: 08/22/23 10:10 PM   Specimen: Nasal Mucosa; Nasal Swab  Result Value Ref Range Status   MRSA by PCR Next Gen NOT DETECTED NOT DETECTED Final    Comment: (NOTE) The GeneXpert MRSA Assay (FDA approved for NASAL specimens only), is one component of a comprehensive MRSA colonization surveillance program. It is not intended to diagnose MRSA infection nor to guide or monitor treatment for MRSA infections. Test performance is not FDA approved in patients less than 76 years old. Performed at [facility], [address] [place], [place] [address]      Scheduled Meds:  atorvastatin  10 mg Oral Daily   Chlorhexidine Gluconate Cloth  6 each Topical Daily   doxycycline  100 mg Oral Q12H   levothyroxine  112 mcg Oral QAC breakfast   metoprolol succinate  50 mg Oral Daily   Continuous Infusions:  meropenem (MERREM) IV 1 g (08/24/23 0811)    Procedures/Studies: CT CHEST WO CONTRAST Result Date: 08/23/2023 CLINICAL DATA:  Fever, chest pain.  History of pericarditis. EXAM: CT CHEST WITHOUT CONTRAST TECHNIQUE: Multidetector CT imaging of the chest was performed following the standard protocol without IV contrast. RADIATION DOSE REDUCTION: This exam was performed according to the departmental dose-optimization program which includes automated exposure control, adjustment of the mA and/or kV according to patient size and/or use of iterative reconstruction technique. COMPARISON:  None Available. FINDINGS: Cardiovascular: LEFT-sided pacer with leads along the RIGHT heart. The no pericardial fluid. Aorta and great vessels normal on noncontrast exam. No pericardial fluid. Mediastinum/Nodes: No axillary or supraclavicular adenopathy. No mediastinal or hilar adenopathy. No pericardial fluid. Esophagus normal. Lungs/Pleura: No pulmonary infarction. No pneumonia. No pleural fluid. No pneumothorax Upper Abdomen: 2 months no prior multiple gallstones within nondistended gallbladder. Musculoskeletal: No aggressive  osseous lesion. IMPRESSION: 1. No evidence of pericarditis. 2. No pulmonary infarction or pneumonia. 3. Cholelithiasis. Electronically Signed   By: Genevive Bi M.D.   On:  08/23/2023 13:32   CT ABDOMEN PELVIS WO CONTRAST Result Date: 08/22/2023 CLINICAL DATA:  Right-sided abdominal pain and fever. EXAM: CT ABDOMEN AND PELVIS WITHOUT CONTRAST TECHNIQUE: Multidetector CT imaging of the abdomen and pelvis was performed following the standard protocol without IV contrast. RADIATION DOSE REDUCTION: This exam was performed according to the departmental dose-optimization program which includes automated exposure control, adjustment of the mA and/or kV according to patient size and/or use of iterative reconstruction technique. COMPARISON:  05/19/2023 and MR abdomen 02/18/2023. FINDINGS: Lower chest: Minimal mucoid impaction in the right lower lobe. Mild dependent atelectasis. Heart is enlarged. No pericardial or pleural effusion. Distal esophagus is grossly unremarkable. Hepatobiliary: Liver is grossly unremarkable. Gallstones. No biliary ductal dilatation. Pancreas: Negative. Spleen: Negative. Adrenals/Urinary Tract: Right adrenal gland and right kidney are grossly unremarkable. Right ureter is decompressed. Left adrenalectomy and left nephrectomy. Cysto prostatectomy with a right lower quadrant ileal conduit. Stomach/Bowel: Right ileal conduit formation. Small bowel, right inguinal hernia contains unobstructed small bowel. Stomach, small bowel and colon are otherwise unremarkable. Vascular/Lymphatic: Atherosclerotic calcification of the aorta. No pathologically enlarged lymph nodes. Reproductive: Prostatectomy. Other: Right inguinal hernia contains unobstructed small bowel. Left inguinal hernia contains fat. Mesenteries and peritoneum are unremarkable. Musculoskeletal: Degenerative changes in the spine. IMPRESSION: 1. No acute findings to explain the patient's pain or fever. 2. Cholelithiasis. 3. Cysto  prostatectomy with a right lower quadrant ileal conduit. 4. Right inguinal hernia contains unobstructed small bowel. 5.  Aortic atherosclerosis (ICD10-I70.0). Electronically Signed   By: Leanna Battles M.D.   On: 08/22/2023 14:49   DG Chest 2 View Result Date: 08/21/2023 CLINICAL DATA:  r/o PNA EXAM: CHEST - 2 VIEW COMPARISON:  Chest x-ray 05/29/2023. FINDINGS: Left chest wall triple lead cardiac pacemaker/defibrillator. The heart and mediastinal contours are unchanged. Atherosclerotic plaque. Similar pericentimeter round density overlying the right lower lung zone suggestive of a nipple shadow. No focal consolidation. No pulmonary edema. No pleural effusion. No pneumothorax. No acute osseous abnormality. Bilateral total shoulder arthroplasty. IMPRESSION: 1. No active cardiopulmonary disease. 2.  Aortic Atherosclerosis (ICD10-I70.0). Electronically Signed   By: Tish Frederickson M.D.   On: 08/21/2023 21:30    Catarina Hartshorn, DO  [facility]  If 7PM-7AM, please contact night-coverage [email or web address] Password [email or web address] 08/24/2023, 5:08 PM   LOS: 3 days

## 2023-08-24 NOTE — Progress Notes (Signed)
 CPAP tubing and mask not with patient on unit 300, CPAP machine with patient on transfer

## 2023-08-24 NOTE — Plan of Care (Signed)

## 2023-08-25 ENCOUNTER — Inpatient Hospital Stay (HOSPITAL_COMMUNITY)

## 2023-08-25 DIAGNOSIS — N3000 Acute cystitis without hematuria: Secondary | ICD-10-CM | POA: Diagnosis not present

## 2023-08-25 LAB — COMPREHENSIVE METABOLIC PANEL
ALT: 54 U/L — ABNORMAL HIGH (ref 0–44)
AST: 111 U/L — ABNORMAL HIGH (ref 15–41)
Albumin: 3.3 g/dL — ABNORMAL LOW (ref 3.5–5.0)
Alkaline Phosphatase: 48 U/L (ref 38–126)
Anion gap: 9 (ref 5–15)
BUN: 44 mg/dL — ABNORMAL HIGH (ref 8–23)
CO2: 20 mmol/L — ABNORMAL LOW (ref 22–32)
Calcium: 8.3 mg/dL — ABNORMAL LOW (ref 8.9–10.3)
Chloride: 98 mmol/L (ref 98–111)
Creatinine, Ser: 1.73 mg/dL — ABNORMAL HIGH (ref 0.61–1.24)
GFR, Estimated: 41 mL/min — ABNORMAL LOW (ref >60)
Glucose, Bld: 100 mg/dL — ABNORMAL HIGH (ref 70–99)
Potassium: 3.8 mmol/L (ref 3.5–5.1)
Sodium: 127 mmol/L — ABNORMAL LOW (ref 135–145)
Total Bilirubin: 1.3 mg/dL — ABNORMAL HIGH (ref 0.0–1.2)
Total Protein: 6.6 g/dL (ref 6.5–8.1)

## 2023-08-25 LAB — CBC
HCT: 31.3 % — ABNORMAL LOW (ref 39.0–52.0)
Hemoglobin: 11.1 g/dL — ABNORMAL LOW (ref 13.0–17.0)
MCH: 31.7 pg (ref 26.0–34.0)
MCHC: 35.5 g/dL (ref 30.0–36.0)
MCV: 89.4 fL (ref 80.0–100.0)
Platelets: 42 10*3/uL — ABNORMAL LOW (ref 150–400)
RBC: 3.5 MIL/uL — ABNORMAL LOW (ref 4.22–5.81)
RDW: 17.4 % — ABNORMAL HIGH (ref 11.5–15.5)
WBC: 2.4 10*3/uL — ABNORMAL LOW (ref 4.0–10.5)
nRBC: 0 % (ref 0.0–0.2)

## 2023-08-25 LAB — HEPATITIS B SURFACE ANTIGEN: Hepatitis B Surface Ag: NONREACTIVE

## 2023-08-25 LAB — HEPATITIS C ANTIBODY: HCV Ab: NONREACTIVE

## 2023-08-25 MED ORDER — POLYVINYL ALCOHOL 1.4 % OP SOLN
1.0000 [drp] | OPHTHALMIC | Status: DC | PRN
  Filled 2023-08-25: qty 15

## 2023-08-25 NOTE — Progress Notes (Signed)
 Patient has home CPAP in room and has been using.

## 2023-08-25 NOTE — Plan of Care (Signed)

## 2023-08-25 NOTE — Plan of Care (Signed)

## 2023-08-25 NOTE — Progress Notes (Signed)
 [facility] PROGRESS NOTE  Billy Rasmussen (DOB: 05-06-1946) ZOX:[id number] PCP: Billy Ip, DO  Brief Narrative: Billy Rasmussen is a 78 y.o. male with a history of bladder cancer status post cystoprostatectomy  with ileal conduit 10/2018, left nephrectomy (07/2019),  HFrEF (09/2022, LVEF was <20% ) s/p CRT-D (01/2023), nonobstructive CAD  LBBB, prior LV thrombus, pericarditis 05/2023, hypertension, HLD, CKD stage IIIa, thyroid cancer status post thyroidectomy (04/2023) presenting with 2-day history of generalized weakness, fevers, chills, myalgias, and arthralgias.  He states that his symptoms have worsened over the past 2 days.  He had a temperature 102.2 F at home.  Review of medical record shows that the patient was recently seen by PCP on 08/18/2023 and placed on azithromycin for URI type symptoms.  Patient was recently seen by his PCP on 08/18/2023.  He was placed on azithromycin at that time for URI type symptoms.   Notably, the patient had a hospital admission to [facility] on 05/19/2023 to 05/22/2023.  At that time he presented as a transfer from [facility] because of concerns for aortitis versus other rheumatologic condition/vasculitis.  At that time, the patient had been diagnosed with pericarditis but continued to have chest pain despite aspirin and colchicine.  CT imaging at [facility] was independently reviewed by [facility] team and we did not see any concerns for aortic pathology. His CRT device was interrogated and demonstrated no events and he was BiV pacing 98%. As his symptoms were felt consistent with pericarditis, he was restarted on aspirin and colchicine as well as pain control and his symptoms improved. Corticosteroids were held given concern for worsening volume retention and HFrEF. He was eval by rheumatology, and an infectious workup was negative. Billy Rasmussen He was also seen by ophthalmology who were less concern for GCA but recommended he have updated prescription for glasses outpatient.     After the patient was admitted, he was started on Merrem.  Urine cultures and blood cultures were obtained.  CT of the abdomen pelvis did not show any acute abnormalities in the right kidney, particularly no acute abnormalities in the right kidney.  The patient continued to persistent fevers.  Vancomycin was added.  CT of the chest was obtained.  The patient was transferred to SDU for closer monitoring secondary to his tachycardia and fevers.   The patient's fevers persisted although they began trending down.  CT of the chest did not reveal any evidence of pneumonia, pulmonary function, or pericarditis.   On 08/24/2023, the patient's LFTs gradually trended up with progressive leukopenia.  This is suggestive of a tickborne infection.  The patient was empirically started on doxycycline.  Tickborne serologies were ordered. I later spoke with the patient and spouse who confirmed that the patient was recently bitten by tick 1 week prior to admission.   Subjective: Feels generally much better than when he first got here. Much of improvement in past 24 hours. No specific complaints.   Objective: BP 116/72 (BP Location: Left Arm)   Pulse 70   Temp 97.7 F (36.5 C) (Oral)   Resp 19   Ht 5\' 6"  (1.676 m)   Wt 95.4 kg   SpO2 98%   BMI 33.95 kg/m   Gen: No distress Pulm: Clear, nonlabored  CV: RRR, no MRG, No tenderness over PPM GI: Soft, NT, ND, +BS  Neuro: Alert and oriented. No new focal deficits. Ext: Warm, no deformities. Skin: Right lower leg/ankle anteriorly with small punctum without erythema and no other rashes, lesions  or ulcers on visualized skin   Assessment & Plan: Fever/pyuria/leukopenia, initial concern for pyelonephritis: Patient has had a complex urologic history with complex urologic anatomy status post left nephrectomy 07/2019 secondary to recurrent pyelonephritis and ureteral stricture. Multiple previous MDR organisms including ESBL Klebsiella, Enterococcus, Morganella, and  Pseudomonas. Viral panels negative. CT chest without pericarditis, no pneumonia - Continue empiric meropenem for now, though CT abd/pelvis reassuring.  - Follow cultures (all NGTD thus far)  - Now concern for tickborne illness (with leukopenia, LFT elevations, thrombocytopenia in setting of confirmed tick bite PTA). Continue doxycycline and monitoring lab parameters. Lyme, ehrlichia, and spotted fever group titers pending.    CKD stage IIIa, solitary kidney: Baseline creatinine 1.3-1.6 - Minimize nephrotoxins, switched vancomycin to doxycycline as above.    Hyponatremia due to SIADH: - Continue fluid restriction.   LFT elevation: Mild but not improving yet. - Hepatitis B SAg, Hep C Ab negative.  - RUQ U/S pending today.    Chronic HFrEF: 05/19/2023 echo EF 25 to 30%, grade 1 DD, mild decreased RVSF. BiV ICD insertion with Dr. Nelly Laurence on 03/01/2023. Clinically euvolemic - Continue metoprolol succinate, losartan and spironolactone - Not on SGLT2 secondary to recurrent infections   Bladder cancer: s/p cystoprostatectomy with ileal conduit and left nephrectomy secondary recurrent pyelonephritis - Follow-up with Dr. Berneice Heinrich per routine   Thyroid cancer: s/p thyroidectomy 04/2023. Last TSH Dec 2024 normal at 3.969. - Follow up Dr. Pollyann Kennedy per routine     Thrombocytopenia:-Patient has had a history of thrombocytopenia in the past and follows Dr. Shirline Frees, possibly secondary to splenomegaly, now worse secondary to infectious process - Monitor for signs of bleeding   Essential hypertension  -Continue metoprolol succinate and losartan   Coronary artery disease: LHC on 10/09/2022 showed nonobstructive CAD with 30% RCA stenosis. No chest pain presently - Continue statin, BB   History of SVT: Zio patch x 10 days 10/2022 showed 1 episode of NSVT lasting 8 beats, 10 episodes of SVT with longest lasting 19 beats.  - Remains in NSR on telemetry.    Aortic aneurysm: Ascending aortic aneurysm measuring 45  mm on CTPA 08/2022.  - Outpatient surveillance recommended.   Billy Nine, MD [facility] [email or web address] 08/25/2023, 2:53 PM

## 2023-08-26 DIAGNOSIS — N3000 Acute cystitis without hematuria: Secondary | ICD-10-CM | POA: Diagnosis not present

## 2023-08-26 LAB — COMPREHENSIVE METABOLIC PANEL WITH GFR
ALT: 56 U/L — ABNORMAL HIGH (ref 0–44)
AST: 119 U/L — ABNORMAL HIGH (ref 15–41)
Albumin: 3.4 g/dL — ABNORMAL LOW (ref 3.5–5.0)
Alkaline Phosphatase: 54 U/L (ref 38–126)
Anion gap: 10 (ref 5–15)
BUN: 42 mg/dL — ABNORMAL HIGH (ref 8–23)
CO2: 21 mmol/L — ABNORMAL LOW (ref 22–32)
Calcium: 8.7 mg/dL — ABNORMAL LOW (ref 8.9–10.3)
Chloride: 102 mmol/L (ref 98–111)
Creatinine, Ser: 1.59 mg/dL — ABNORMAL HIGH (ref 0.61–1.24)
GFR, Estimated: 45 mL/min — ABNORMAL LOW (ref >60)
Glucose, Bld: 88 mg/dL (ref 70–99)
Potassium: 3.9 mmol/L (ref 3.5–5.1)
Sodium: 133 mmol/L — ABNORMAL LOW (ref 135–145)
Total Bilirubin: 1.2 mg/dL (ref 0.0–1.2)
Total Protein: 7 g/dL (ref 6.5–8.1)

## 2023-08-26 LAB — LYME DISEASE SEROLOGY W/REFLEX: Lyme Total Antibody EIA: NEGATIVE

## 2023-08-26 LAB — CBC
HCT: 32.7 % — ABNORMAL LOW (ref 39.0–52.0)
Hemoglobin: 11.5 g/dL — ABNORMAL LOW (ref 13.0–17.0)
MCH: 31.4 pg (ref 26.0–34.0)
MCHC: 35.2 g/dL (ref 30.0–36.0)
MCV: 89.3 fL (ref 80.0–100.0)
Platelets: 48 10*3/uL — ABNORMAL LOW (ref 150–400)
RBC: 3.66 MIL/uL — ABNORMAL LOW (ref 4.22–5.81)
RDW: 17.4 % — ABNORMAL HIGH (ref 11.5–15.5)
WBC: 2.7 10*3/uL — ABNORMAL LOW (ref 4.0–10.5)
nRBC: 0 % (ref 0.0–0.2)

## 2023-08-26 LAB — EHRLICHIA ANTIBODY PANEL
E chaffeensis (HGE) Ab, IgG: NEGATIVE
E chaffeensis (HGE) Ab, IgM: NEGATIVE
E. Chaffeensis (HME) IgM Titer: NEGATIVE
E.Chaffeensis (HME) IgG: NEGATIVE

## 2023-08-26 LAB — CULTURE, BLOOD (ROUTINE X 2)
Culture: NO GROWTH
Culture: NO GROWTH
Special Requests: ADEQUATE
Special Requests: ADEQUATE

## 2023-08-26 LAB — SPOTTED FEVER GROUP ANTIBODIES
Spotted Fever Group IgG: <1:64 {titer}
Spotted Fever Group IgM: <1:64 {titer}

## 2023-08-26 MED ORDER — DOXYCYCLINE HYCLATE 100 MG PO TABS: 100.0000 mg | ORAL_TABLET | Freq: Two times a day (BID) | ORAL | 0 refills | Status: AC

## 2023-08-26 NOTE — Plan of Care (Signed)
°  Problem: Education: °Goal: Knowledge of General Education information will improve °Description: Including pain rating scale, medication(s)/side effects and non-pharmacologic comfort measures °Outcome: Progressing °  °Problem: Health Behavior/Discharge Planning: °Goal: Ability to manage health-related needs will improve °Outcome: Progressing °  °Problem: Clinical Measurements: °Goal: Ability to maintain clinical measurements within normal limits will improve °Outcome: Progressing °Goal: Respiratory complications will improve °Outcome: Progressing °Goal: Cardiovascular complication will be avoided °Outcome: Progressing °  °Problem: Activity: °Goal: Risk for activity intolerance will decrease °Outcome: Progressing °  °Problem: Nutrition: °Goal: Adequate nutrition will be maintained °Outcome: Progressing °  °

## 2023-08-26 NOTE — Discharge Summary (Signed)
 Physician Discharge Summary   Patient: Billy Rasmussen MRN: [id number] DOB: 1945/08/09  Admit date:     08/21/2023  Discharge date: 08/26/23  Discharge Physician: [name]   PCP: Raliegh Ip, DO   Recommendations at discharge:  Follow up with PCP arranged 3/31, please follow up results (pending at discharge) of tickborne-illness titers. ADDENDUM titers negative.  Suggest recheck CBC and CMP at follow up. Patient has preexisting appointment with Dr. Arbutus Ped 4/1   Discharge Diagnoses: Principal Problem:   UTI (urinary tract infection) Active Problems:   Essential hypertension   Bladder cancer (HCC)   OSA (obstructive sleep apnea)   Thrombocytopenia (HCC)   Acquired hypothyroidism   Leukopenia   CKD stage 3a, GFR 45-59 ml/min (HCC)   Chronic combined systolic and diastolic CHF (congestive heart failure) (HCC)   Mixed hyperlipidemia  Hospital Course: Billy Rasmussen is a 78 y.o. male with a history of bladder cancer status post cystoprostatectomy  with ileal conduit 10/2018, left nephrectomy (07/2019),  HFrEF (09/2022, LVEF was <20% ) s/p CRT-D (01/2023), nonobstructive CAD  LBBB, prior LV thrombus, pericarditis 05/2023, hypertension, HLD, CKD stage IIIa, thyroid cancer status post thyroidectomy (04/2023) presenting with 2-day history of generalized weakness, fevers, chills, myalgias, and arthralgias.  He states that his symptoms have worsened over the past 2 days.  He had a temperature 102.2 F at home.  Review of medical record shows that the patient was recently seen by PCP on 08/18/2023 and placed on azithromycin for URI type symptoms.  Patient was recently seen by his PCP on 08/18/2023.  He was placed on azithromycin at that time for URI type symptoms.   Notably, the patient had a hospital admission to [facility] on 05/19/2023 to 05/22/2023.  At that time he presented as a transfer from [facility] because of concerns for aortitis versus other rheumatologic condition/vasculitis.  At  that time, the patient had been diagnosed with pericarditis but continued to have chest pain despite aspirin and colchicine.  CT imaging at [facility] was independently reviewed by [facility] team and we did not see any concerns for aortic pathology. His CRT device was interrogated and demonstrated no events and he was BiV pacing 98%. As his symptoms were felt consistent with pericarditis, he was restarted on aspirin and colchicine as well as pain control and his symptoms improved. Corticosteroids were held given concern for worsening volume retention and HFrEF. He was eval by rheumatology, and an infectious workup was negative. Marland Kitchen He was also seen by ophthalmology who were less concern for GCA but recommended he have updated prescription for glasses outpatient.    After the patient was admitted, he was started on Merrem.  Urine cultures and blood cultures were obtained.  CT of the abdomen pelvis did not show any acute abnormalities in the right kidney, particularly no acute abnormalities in the right kidney.  The patient continued to persistent fevers.  Vancomycin was added.  CT of the chest was obtained.  The patient was transferred to SDU for closer monitoring secondary to his tachycardia and fevers.   The patient's fevers persisted although they began trending down.  CT of the chest did not reveal any evidence of pneumonia, pulmonary function, or pericarditis.   On 08/24/2023, the patient's LFTs gradually trended up with progressive leukopenia and ongoing fevers. Additional history taken included the patient had found a tick engorged on the right lower leg prior to admission. Serologies were obtained and doxycycline started. Fever subsided and the patient began  feeling much better after this. He will continue this at discharge with results of serology pending.    Assessment and Plan: Fever/pyuria/leukopenia, initial concern for pyelonephritis: Patient has had a complex urologic history with complex urologic  anatomy status post left nephrectomy 07/2019 secondary to recurrent pyelonephritis and ureteral stricture. Multiple previous MDR organisms including ESBL Klebsiella, Enterococcus, Morganella, and Pseudomonas. Viral panels negative. CT chest without pericarditis, no pneumonia - Completed empiric meropenem, though CT abd/pelvis reassuring and culture nonclonal. Blood cultures also negative. - Now concern for tickborne illness (with leukopenia, LFT elevations, thrombocytopenia in setting of confirmed tick bite PTA). Continue doxycycline x10 days and monitoring lab parameters at follow up. Lyme, ehrlichia, and spotted fever group titers pending.  - Pancytopenia has stabilized, beginning improvement at discharge. Please recheck at follow up.   CKD stage IIIa, solitary kidney: Baseline creatinine 1.3-1.6 - Minimize nephrotoxins, switched vancomycin to doxycycline as above.    Hyponatremia due to SIADH: - Continue fluid restriction.    LFT elevation: Mild and stable. - Hepatitis B SAg, Hep C Ab negative.  - RUQ U/S showed cholelithiasis without other abnormality.    Chronic HFrEF: 05/19/2023 echo EF 25 to 30%, grade 1 DD, mild decreased RVSF. BiV ICD insertion with Dr. Nelly Laurence on 03/01/2023. Clinically euvolemic - Continue metoprolol succinate, losartan and spironolactone - Not on SGLT2 secondary to recurrent infections   Bladder cancer: s/p cystoprostatectomy with ileal conduit and left nephrectomy secondary recurrent pyelonephritis - Follow-up with Dr. Berneice Heinrich per routine   Thyroid cancer: s/p thyroidectomy 04/2023. Last TSH Dec 2024 normal at 3.969. - Follow up Dr. Pollyann Kennedy per routine     Thrombocytopenia:-Patient has had a history of thrombocytopenia in the past and follows Dr. Shirline Frees, possibly secondary to splenomegaly, now worse secondary to infectious process - Monitor for signs of bleeding, f/u with Dr. Arbutus Ped 4/1.   Essential hypertension  - Continue metoprolol succinate and losartan    Coronary artery disease: LHC on 10/09/2022 showed nonobstructive CAD with 30% RCA stenosis. No chest pain presently - Continue statin, BB   History of SVT: Zio patch x 10 days 10/2022 showed 1 episode of NSVT lasting 8 beats, 10 episodes of SVT with longest lasting 19 beats.  - Remained in NSR on telemetry.    Aortic aneurysm: Ascending aortic aneurysm measuring 45 mm on CTPA 08/2022.  - Outpatient surveillance recommended.   Consultants: None (had ID trained hospitalist attending during hospitalization) Procedures performed: None  Disposition: Home Diet recommendation:  Cardiac diet DISCHARGE MEDICATION: Allergies as of 08/26/2023       Reactions   Codeine Sulfate Other (See Comments)   Unknown         Medication List     TAKE these medications    Acetaminophen Extra Strength 500 MG Tabs Take 2 tablets by mouth every 6 (six) hours as needed (fever, pain).   atorvastatin 10 MG tablet Commonly known as: LIPITOR Take 1 tablet (10 mg total) by mouth daily.   azelastine 0.1 % nasal spray Commonly known as: ASTELIN Place 1 spray into both nostrils 2 (two) times daily. Use in each nostril as directed   colchicine 0.6 MG tablet Take 0.6 mg by mouth daily.   doxycycline 100 MG tablet Commonly known as: VIBRA-TABS Take 1 tablet (100 mg total) by mouth every 12 (twelve) hours for 8 days.   furosemide 20 MG tablet Commonly known as: LASIX Take 1 tablet (20 mg) daily as needed if you gain more than 3 pounds in 1 day  or 5 pounds in 1 week.   levothyroxine 112 MCG tablet Commonly known as: SYNTHROID Take 112 mcg by mouth every morning.   losartan 25 MG tablet Commonly known as: COZAAR Take 1 tablet (25 mg total) by mouth daily.   metoprolol succinate 50 MG 24 hr tablet Commonly known as: TOPROL-XL Take 1 tablet by mouth daily.   multivitamin capsule Take 1 capsule by mouth daily.   oxyCODONE 5 MG immediate release tablet Commonly known as: Oxy IR/ROXICODONE Take 5  mg by mouth every 6 (six) hours as needed for moderate pain (pain score 4-6).   polyvinyl alcohol 1.4 % ophthalmic solution Commonly known as: LIQUIFILM TEARS Place 1 drop into both eyes as needed for dry eyes.   spironolactone 25 MG tablet Commonly known as: ALDACTONE Take 12.5 mg by mouth daily.        Follow-up Information     Delynn Flavin M, DO. Schedule an appointment as soon as possible for a visit in 1 week(s).   Specialty: Family Medicine Contact information: [address] [place] [address] [phone]                Discharge Exam: Ceasar Mons Weights   08/22/23 0100 08/22/23 0800  Weight: 95.4 kg 95.4 kg  No distress, well appearing Clear, nonlabored RRR, NSR on monitor, no edema Alert, oriented, nonfocal Right lower leg/ankle anteriorly with small punctum without erythema   Condition at discharge: stable  The results of significant diagnostics from this hospitalization (including imaging, microbiology, ancillary and laboratory) are listed below for reference.   Imaging Studies: US Abdomen Limited RUQ (LIVER/GB) Result Date: 08/25/2023 CLINICAL DATA:  Transaminitis. EXAM: ULTRASOUND ABDOMEN LIMITED RIGHT UPPER QUADRANT COMPARISON:  CT abdomen and pelvis 08/22/2023 FINDINGS: Gallbladder: Gallstones are present measuring up to 9 mm. There is no gallbladder wall thickening or pericholecystic fluid. No sonographic Murphy sign noted by sonographer. Common bile duct: Diameter: 3.7 mm Liver: No focal lesion identified. Within normal limits in parenchymal echogenicity. Portal vein is patent on color Doppler imaging with normal direction of blood flow towards the liver. Other: None. IMPRESSION: Cholelithiasis without sonographic evidence of acute cholecystitis. Electronically Signed   By: Darliss Cheney M.D.   On: 08/25/2023 15:26   CT CHEST WO CONTRAST Result Date: 08/23/2023 CLINICAL DATA:  Fever, chest pain.  History of pericarditis. EXAM: CT CHEST WITHOUT  CONTRAST TECHNIQUE: Multidetector CT imaging of the chest was performed following the standard protocol without IV contrast. RADIATION DOSE REDUCTION: This exam was performed according to the departmental dose-optimization program which includes automated exposure control, adjustment of the mA and/or kV according to patient size and/or use of iterative reconstruction technique. COMPARISON:  None Available. FINDINGS: Cardiovascular: LEFT-sided pacer with leads along the RIGHT heart. The no pericardial fluid. Aorta and great vessels normal on noncontrast exam. No pericardial fluid. Mediastinum/Nodes: No axillary or supraclavicular adenopathy. No mediastinal or hilar adenopathy. No pericardial fluid. Esophagus normal. Lungs/Pleura: No pulmonary infarction. No pneumonia. No pleural fluid. No pneumothorax Upper Abdomen: 2 months no prior multiple gallstones within nondistended gallbladder. Musculoskeletal: No aggressive osseous lesion. IMPRESSION: 1. No evidence of pericarditis. 2. No pulmonary infarction or pneumonia. 3. Cholelithiasis. Electronically Signed   By: Genevive Bi M.D.   On: 08/23/2023 13:32   CT ABDOMEN PELVIS WO CONTRAST Result Date: 08/22/2023 CLINICAL DATA:  Right-sided abdominal pain and fever. EXAM: CT ABDOMEN AND PELVIS WITHOUT CONTRAST TECHNIQUE: Multidetector CT imaging of the abdomen and pelvis was performed following the standard protocol without IV  contrast. RADIATION DOSE REDUCTION: This exam was performed according to the departmental dose-optimization program which includes automated exposure control, adjustment of the mA and/or kV according to patient size and/or use of iterative reconstruction technique. COMPARISON:  05/19/2023 and MR abdomen 02/18/2023. FINDINGS: Lower chest: Minimal mucoid impaction in the right lower lobe. Mild dependent atelectasis. Heart is enlarged. No pericardial or pleural effusion. Distal esophagus is grossly unremarkable. Hepatobiliary: Liver is grossly  unremarkable. Gallstones. No biliary ductal dilatation. Pancreas: Negative. Spleen: Negative. Adrenals/Urinary Tract: Right adrenal gland and right kidney are grossly unremarkable. Right ureter is decompressed. Left adrenalectomy and left nephrectomy. Cysto prostatectomy with a right lower quadrant ileal conduit. Stomach/Bowel: Right ileal conduit formation. Small bowel, right inguinal hernia contains unobstructed small bowel. Stomach, small bowel and colon are otherwise unremarkable. Vascular/Lymphatic: Atherosclerotic calcification of the aorta. No pathologically enlarged lymph nodes. Reproductive: Prostatectomy. Other: Right inguinal hernia contains unobstructed small bowel. Left inguinal hernia contains fat. Mesenteries and peritoneum are unremarkable. Musculoskeletal: Degenerative changes in the spine. IMPRESSION: 1. No acute findings to explain the patient's pain or fever. 2. Cholelithiasis. 3. Cysto prostatectomy with a right lower quadrant ileal conduit. 4. Right inguinal hernia contains unobstructed small bowel. 5.  Aortic atherosclerosis (ICD10-I70.0). Electronically Signed   By: Leanna Battles M.D.   On: 08/22/2023 14:49   DG Chest 2 View Result Date: 08/21/2023 CLINICAL DATA:  r/o PNA EXAM: CHEST - 2 VIEW COMPARISON:  Chest x-ray 05/29/2023. FINDINGS: Left chest wall triple lead cardiac pacemaker/defibrillator. The heart and mediastinal contours are unchanged. Atherosclerotic plaque. Similar pericentimeter round density overlying the right lower lung zone suggestive of a nipple shadow. No focal consolidation. No pulmonary edema. No pleural effusion. No pneumothorax. No acute osseous abnormality. Bilateral total shoulder arthroplasty. IMPRESSION: 1. No active cardiopulmonary disease. 2.  Aortic Atherosclerosis (ICD10-I70.0). Electronically Signed   By: Tish Frederickson M.D.   On: 08/21/2023 21:30    Microbiology: Results for orders placed or performed during the hospital encounter of 08/21/23   Resp panel by RT-PCR (RSV, Flu A&B, Covid) Anterior Nasal Swab     Status: None   Collection Time: 08/21/23  8:40 PM   Specimen: Anterior Nasal Swab  Result Value Ref Range Status   SARS Coronavirus 2 by RT PCR NEGATIVE NEGATIVE Final    Comment: (NOTE) SARS-CoV-2 target nucleic acids are NOT DETECTED.  The SARS-CoV-2 RNA is generally detectable in upper respiratory specimens during the acute phase of infection. The lowest concentration of SARS-CoV-2 viral copies this assay can detect is 138 copies/mL. A negative result does not preclude SARS-Cov-2 infection and should not be used as the sole basis for treatment or other patient management decisions. A negative result may occur with  improper specimen collection/handling, submission of specimen other than nasopharyngeal swab, presence of viral mutation(s) within the areas targeted by this assay, and inadequate number of viral copies(<138 copies/mL). A negative result must be combined with clinical observations, patient history, and epidemiological information. The expected result is Negative.  Fact Sheet for Patients:  BloggerCourse.com  Fact Sheet for Healthcare Providers:  SeriousBroker.it  This test is no t yet approved or cleared by the [place] FDA and  has been authorized for detection and/or diagnosis of SARS-CoV-2 by FDA under an Emergency Use Authorization (EUA). This EUA will remain  in effect (meaning this test can be used) for the duration of the COVID-19 declaration under Section 564(b)(1) of the Act, 21 U.S.C.section 360bbb-3(b)(1), unless the authorization is terminated  or revoked sooner.  Influenza A by PCR NEGATIVE NEGATIVE Final   Influenza B by PCR NEGATIVE NEGATIVE Final    Comment: (NOTE) The Xpert Xpress SARS-CoV-2/FLU/RSV plus assay is intended as an aid in the diagnosis of influenza from Nasopharyngeal swab specimens and should not be used  as a sole basis for treatment. Nasal washings and aspirates are unacceptable for Xpert Xpress SARS-CoV-2/FLU/RSV testing.  Fact Sheet for Patients: BloggerCourse.com  Fact Sheet for Healthcare Providers: SeriousBroker.it  This test is not yet approved or cleared by the [place] FDA and has been authorized for detection and/or diagnosis of SARS-CoV-2 by FDA under an Emergency Use Authorization (EUA). This EUA will remain in effect (meaning this test can be used) for the duration of the COVID-19 declaration under Section 564(b)(1) of the Act, 21 U.S.C. section 360bbb-3(b)(1), unless the authorization is terminated or revoked.     Resp Syncytial Virus by PCR NEGATIVE NEGATIVE Final    Comment: (NOTE) Fact Sheet for Patients: BloggerCourse.com  Fact Sheet for Healthcare Providers: SeriousBroker.it  This test is not yet approved or cleared by the [place] FDA and has been authorized for detection and/or diagnosis of SARS-CoV-2 by FDA under an Emergency Use Authorization (EUA). This EUA will remain in effect (meaning this test can be used) for the duration of the COVID-19 declaration under Section 564(b)(1) of the Act, 21 U.S.C. section 360bbb-3(b)(1), unless the authorization is terminated or revoked.  Performed at [facility], [address] [place], [place] [address]   Urine Culture     Status: Abnormal   Collection Time: 08/21/23  9:56 PM   Specimen: Urine, Clean Catch  Result Value Ref Range Status   Specimen Description   Final    URINE, CLEAN CATCH Performed at [facility], [address], [place], [place] [address]    Special Requests   Final    NONE Performed at [facility], [address] [place], [place] [address]    Culture MULTIPLE SPECIES PRESENT, SUGGEST RECOLLECTION (A)  Final   Report Status 08/23/2023 FINAL  Final  Blood culture (routine x 2)      Status: None   Collection Time: 08/21/23 10:45 PM   Specimen: BLOOD RIGHT HAND  Result Value Ref Range Status   Specimen Description BLOOD RIGHT HAND  Final   Special Requests   Final    BOTTLES DRAWN AEROBIC AND ANAEROBIC Blood Culture adequate volume   Culture   Final    NO GROWTH 5 DAYS Performed at [facility], [address] [place], [place] [address]    Report Status 08/26/2023 FINAL  Final  Blood culture (routine x 2)     Status: None   Collection Time: 08/21/23 11:45 PM   Specimen: Left Antecubital; Blood  Result Value Ref Range Status   Specimen Description LEFT ANTECUBITAL  Final   Special Requests   Final    BOTTLES DRAWN AEROBIC AND ANAEROBIC Blood Culture adequate volume   Culture   Final    NO GROWTH 5 DAYS Performed at [facility], [address] [place], [place] [address]    Report Status 08/26/2023 FINAL  Final  Respiratory (~20 pathogens) panel by PCR     Status: None   Collection Time: 08/22/23  8:00 AM   Specimen: Nasopharyngeal Swab; Respiratory  Result Value Ref Range Status   Adenovirus NOT DETECTED NOT DETECTED Final   Coronavirus 229E NOT DETECTED NOT DETECTED Final    Comment: (NOTE) The Coronavirus on the Respiratory Panel, DOES NOT test for  the novel  Coronavirus (2019 nCoV)    Coronavirus HKU1 NOT DETECTED NOT DETECTED Final   Coronavirus NL63 NOT DETECTED NOT DETECTED Final   Coronavirus OC43 NOT DETECTED NOT DETECTED Final   Metapneumovirus NOT DETECTED NOT DETECTED Final   Rhinovirus / Enterovirus NOT DETECTED NOT DETECTED Final   Influenza A NOT DETECTED NOT DETECTED Final   Influenza B NOT DETECTED NOT DETECTED Final   Parainfluenza Virus 1 NOT DETECTED NOT DETECTED Final   Parainfluenza Virus 2 NOT DETECTED NOT DETECTED Final   Parainfluenza Virus 3 NOT DETECTED NOT DETECTED Final   Parainfluenza Virus 4 NOT DETECTED NOT DETECTED Final   Respiratory Syncytial Virus NOT DETECTED NOT DETECTED Final   Bordetella pertussis NOT DETECTED  NOT DETECTED Final   Bordetella Parapertussis NOT DETECTED NOT DETECTED Final   Chlamydophila pneumoniae NOT DETECTED NOT DETECTED Final   Mycoplasma pneumoniae NOT DETECTED NOT DETECTED Final    Comment: Performed at [facility], [address] [place], [place] [address]  Culture, blood (Routine X 2) w Reflex to ID Panel     Status: None   Collection Time: 08/22/23  1:57 PM   Specimen: BLOOD RIGHT HAND  Result Value Ref Range Status   Specimen Description   Final    BLOOD RIGHT HAND BOTTLES DRAWN AEROBIC AND ANAEROBIC   Special Requests Blood Culture adequate volume  Final   Culture   Final    NO GROWTH 5 DAYS Performed at [facility], [address], [place], [place] [address]    Report Status 08/27/2023 FINAL  Final  Culture, blood (Routine X 2) w Reflex to ID Panel     Status: None   Collection Time: 08/22/23  2:40 PM   Specimen: BLOOD  Result Value Ref Range Status   Specimen Description BLOOD LEFT ANTECUBITAL  Final   Special Requests   Final    BOTTLES DRAWN AEROBIC AND ANAEROBIC Blood Culture adequate volume   Culture   Final    NO GROWTH 5 DAYS Performed at [facility], [address] [place], [place] [address]    Report Status 08/27/2023 FINAL  Final  MRSA Next Gen by PCR, Nasal     Status: None   Collection Time: 08/22/23 10:10 PM   Specimen: Nasal Mucosa; Nasal Swab  Result Value Ref Range Status   MRSA by PCR Next Gen NOT DETECTED NOT DETECTED Final    Comment: (NOTE) The GeneXpert MRSA Assay (FDA approved for NASAL specimens only), is one component of a comprehensive MRSA colonization surveillance program. It is not intended to diagnose MRSA infection nor to guide or monitor treatment for MRSA infections. Test performance is not FDA approved in patients less than 17 years old. Performed at [facility], [address] [place], [place] [address]     Labs: CBC: Recent Labs  Lab 08/21/23 2110 08/22/23 0428 08/22/23 0812 08/23/23 0423  08/24/23 0451 08/25/23 0500 08/26/23 0437  WBC 3.0*   < > 2.9* 3.8* 2.6* 2.4* 2.7*  NEUTROABS 2.1  --   --  2.7 2.1  --   --   HGB 13.4   < > 13.0 14.2 12.3* 11.1* 11.5*  HCT 39.0   < > 37.1* 41.3 35.5* 31.3* 32.7*  MCV 92.2   < > 90.7 93.4 90.6 89.4 89.3  PLT 85*   < > 75* 56* 50* 42* 48*   < > = values in this interval not displayed.   Basic Metabolic Panel: Recent Labs  Lab 08/22/23 0428 08/23/23 0423 08/24/23 0451 08/25/23 0500 08/26/23 0437  NA 130* 130* 129* 127* 133*  K 4.5 4.6 4.5 3.8 3.9  CL 99 98 99 98 102  CO2 22 22 21* 20* 21*  GLUCOSE 110* 108* 113* 100* 88  BUN 25* 33* 37* 44* 42*  CREATININE 1.40* 1.67* 1.83* 1.73* 1.59*  CALCIUM 9.0 8.9 8.7* 8.3* 8.7*  MG 2.0 2.4  --   --   --   PHOS 2.6  --   --   --   --    Liver Function Tests: Recent Labs  Lab 08/22/23 0428 08/23/23 0423 08/24/23 0451 08/25/23 0500 08/26/23 0437  AST 65* 88* 106* 111* 119*  ALT 34 44 50* 54* 56*  ALKPHOS 56 55 50 48 54  BILITOT 1.6* 1.5* 1.4* 1.3* 1.2  PROT 7.6 8.0 7.1 6.6 7.0  ALBUMIN 4.0 4.0 3.6 3.3* 3.4*   CBG: No results for input(s): "GLUCAP" in the last 168 hours.  Discharge time spent: greater than 30 minutes.  Signed: [name], MD [facility] 08/28/2023

## 2023-08-26 NOTE — Progress Notes (Signed)
Pt discharged via WC to POV with all personal belongings in his possession. 

## 2023-08-27 ENCOUNTER — Telehealth: Payer: Self-pay | Admitting: *Deleted

## 2023-08-27 ENCOUNTER — Encounter: Payer: Self-pay | Admitting: *Deleted

## 2023-08-27 LAB — CULTURE, BLOOD (ROUTINE X 2)
Special Requests: ADEQUATE
Special Requests: ADEQUATE

## 2023-08-27 NOTE — Transitions of Care (Post Inpatient/ED Visit) (Signed)
 08/27/2023  Name: Billy Rasmussen MRN: [id number] DOB: [birth year]  Today's TOC FU Call Status: Today's TOC FU Call Status:: Unsuccessful Call (1st Attempt) Unsuccessful Call (1st Attempt) Date: 08/27/23 Patient's Name and Date of Birth confirmed.  Transition Care Management Follow-up Telephone Call Date of Discharge: 08/26/23 [facility] Type of Discharge: Inpatient Admission Primary Inpatient Discharge Diagnosis:: UTI (urinary tract infection) How have you been since you were released from the hospital?:  (eating & drinking well, ambulating without difficulty, no issues with bowel/ bladder, continues with antibiotic) Any questions or concerns?: No  Items Reviewed: Did you receive and understand the discharge instructions provided?: No Medications obtained,verified, and reconciled?: Yes (Medications Reviewed) Any new allergies since your discharge?: No Dietary orders reviewed?: Yes Type of Diet Ordered:: low sodium heart healthy Do you have support at home?: Yes People in Home: spouse Name of Support/Comfort Primary Source: Kaycen Whitworth Patient declines enrollment in TOC 30 day program  Interventions Today    Flowsheet Row Most Recent Value  Chronic Disease   Chronic disease during today's visit Other  [UTI, suspected tickborne illness]  General Interventions   General Interventions Discussed/Reviewed General Interventions Discussed, General Interventions Reviewed, Health Screening, Doctor Visits  Doctor Visits Discussed/Reviewed Doctor Visits Discussed, Doctor Visits Reviewed  [facility] with care guide and post hospital appointment scheduled for 08/30/23 @ 8 am, pt verbalizes understanding]  Education Interventions   Education Provided Provided Education  Provided Verbal Education On When to see the doctor, Other  [Reviewed signs/syptoms UTI, prevention, reviewed importance of taking antibiotic as prescribed, side effects antibiotic]  Nutrition  Interventions   Nutrition Discussed/Reviewed Nutrition Discussed, Fluid intake          Medications Reviewed Today: Medications Reviewed Today     Reviewed by Audrie Gallus, RN (Registered Nurse) on 08/27/23 at 1019  Med List Status: <None>   Medication Order Taking? Sig Documenting Provider Last Dose Status Informant  Acetaminophen Extra Strength 500 MG TABS [id number] Yes Take 2 tablets by mouth every 6 (six) hours as needed (fever, pain). [provider] Taking Active Spouse/Significant Other  atorvastatin (LIPITOR) 10 MG tablet [id number] Yes Take 1 tablet (10 mg total) by mouth daily. Little Ishikawa, MD Taking Active Spouse/Significant Other  azelastine (ASTELIN) 0.1 % nasal spray [id number] Yes Place 1 spray into both nostrils 2 (two) times daily. Use in each nostril as directed Evern Bio, Dois Davenport, NP Taking Active Spouse/Significant Other  colchicine 0.6 MG tablet [id number] Yes Take 0.6 mg by mouth daily. [provider] Taking Active Spouse/Significant Other  doxycycline (VIBRA-TABS) 100 MG tablet [id number] Yes Take 1 tablet (100 mg total) by mouth every 12 (twelve) hours for 8 days. [name], MD Taking Active   furosemide (LASIX) 20 MG tablet [id number] Yes Take 1 tablet (20 mg) daily as needed if you gain more than 3 pounds in 1 day or 5 pounds in 1 week. Robbie Lis M, PA-C Taking Active Spouse/Significant Other  levothyroxine (SYNTHROID) 112 MCG tablet [id number] Yes Take 112 mcg by mouth every morning. [provider] Taking Active Spouse/Significant Other  losartan (COZAAR) 25 MG tablet [id number] Yes Take 1 tablet (25 mg total) by mouth daily. Little Ishikawa, MD Taking Active Spouse/Significant Other  metoprolol succinate (TOPROL-XL) 50 MG 24 hr tablet [id number] Yes Take 1 tablet by mouth daily. [provider] Taking Active Spouse/Significant Other  Multiple Vitamin (MULTIVITAMIN) capsule [id number] Yes Take 1  capsule by mouth daily. [provider] Taking  Active Spouse/Significant Other  oxyCODONE (OXY IR/ROXICODONE) 5 MG immediate release tablet [id number] No Take 5 mg by mouth every 6 (six) hours as needed for moderate pain (pain score 4-6).  Patient not taking: Reported on 08/27/2023   [provider] Not Taking Active Spouse/Significant Other  polyvinyl alcohol (LIQUIFILM TEARS) 1.4 % ophthalmic solution [id number] Yes Place 1 drop into both eyes as needed for dry eyes. Saundra Shelling, MD Taking Active Spouse/Significant Other           Med Note Christena Flake Feb 18, 2023  4:30 PM)    spironolactone (ALDACTONE) 25 MG tablet [id number] Yes Take 12.5 mg by mouth daily. [provider] Taking Active Spouse/Significant Other            Home Care and Equipment/Supplies: Were Home Health Services Ordered?: No Any new equipment or medical supplies ordered?: No  Functional Questionnaire: Do you need assistance with bathing/showering or dressing?: No Do you need assistance with meal preparation?: No Do you need assistance with eating?: No Do you have difficulty maintaining continence: No Do you need assistance with getting out of bed/getting out of a chair/moving?: No Do you have difficulty managing or taking your medications?: No  Follow up appointments reviewed: PCP Follow-up appointment confirmed?: Yes Date of PCP follow-up appointment?: 08/30/23 Follow-up Provider: [facility] Richardson Landry NP  @ 8 am  (pt agreeable to see another provider) Specialist Hospital Follow-up appointment confirmed?: Yes Date of Specialist follow-up appointment?: 08/31/23 Follow-Up Specialty Provider:: [facility]  Dr. Arbutus Ped  @ 10 am Do you need transportation to your follow-up appointment?: No Do you understand care options if your condition(s) worsen?: Yes-patient verbalized understanding  SDOH Interventions Today    Flowsheet Row Most Recent Value  SDOH Interventions    Food Insecurity Interventions Intervention Not Indicated  Housing Interventions Intervention Not Indicated  Transportation Interventions Intervention Not Indicated  Utilities Interventions Intervention Not Indicated       Irving Shows [facility], BSN RN Care Manager/ Transition of Care [place]/ [facility] [phone]

## 2023-08-30 ENCOUNTER — Ambulatory Visit (INDEPENDENT_AMBULATORY_CARE_PROVIDER_SITE_OTHER): Admitting: Nurse Practitioner

## 2023-08-30 ENCOUNTER — Encounter: Payer: Self-pay | Admitting: Nurse Practitioner

## 2023-08-30 VITALS — BP 109/60 | HR 78 | Temp 97.1°F | Ht 66.0 in | Wt 210.4 lb

## 2023-08-30 DIAGNOSIS — N1831 Chronic kidney disease, stage 3a: Secondary | ICD-10-CM | POA: Diagnosis not present

## 2023-08-30 DIAGNOSIS — N3001 Acute cystitis with hematuria: Secondary | ICD-10-CM

## 2023-08-30 DIAGNOSIS — D72819 Decreased white blood cell count, unspecified: Secondary | ICD-10-CM

## 2023-08-30 DIAGNOSIS — Z09 Encounter for follow-up examination after completed treatment for conditions other than malignant neoplasm: Secondary | ICD-10-CM

## 2023-08-30 NOTE — Progress Notes (Unsigned)
 Established Patient Office Visit  Subjective  Patient ID: Billy Rasmussen, male    DOB: [birth year]  Age: 78 y.o. 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 PMH of bladder cancer status post cystoprostatectomy with ileal conduit 10/2018, left nephrectomy, HFrEF, LVEF was <20% ) s/p CRT-D, nonobstructive CAD LBBB, prior LV thrombus, pericarditis, hypertension, HLD, CKD stage IIIa, thyroid cancer status post thyroidectomy. Billy Rasmussen reports initially he went to the hospital for a UTI, but prior to that she has removed a tick. CT of the abdomen pelvis obtained were unremarkable, urine culture obtained, he was started on Merrem. His fever persist, CT shows no evidence of pneumonia, pericarditis. He was transfer to ICU and started on Vancomycin. Billy Rasmussen was still feeling dizzy "when Rasmussen sit down upon standing up from feeling dizzy, but once Rasmussen start moving it gets better" he is concerned about the recent titer that was drawn at the hospital.  His appetite is back to normal when he is sleeping okay Patient Active Problem List   Diagnosis Date Noted   Leukopenia 08/22/2023   CKD stage 3a, GFR 45-59 ml/min (HCC) 08/22/2023   Chronic combined systolic and diastolic CHF (congestive heart failure) (HCC) 08/22/2023   Mixed hyperlipidemia 08/22/2023   Respiratory tract congestion with cough 08/18/2023   Bleeding from the nose 07/06/2023   Ageusia 07/06/2023   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   Hospital discharge follow-up 05/17/2023   Pericarditis 05/17/2023   Chest pain 05/13/2023   S/P total thyroidectomy 04/14/2023   HFrEF (heart failure with reduced ejection fraction) (HCC) 10/10/2022   Acute combined systolic and diastolic heart failure (HCC) 10/08/2022   Acute  on chronic systolic CHF (congestive heart failure) (HCC) 10/08/2022   Hyponatremia 10/07/2022   Thrombocytopenia (HCC) 10/07/2022   Elevated troponin 10/07/2022   Acquired hypothyroidism 10/07/2022   Hypotension 10/06/2022   Multiple thyroid nodules 09/14/2022   Aneurysm of ascending aorta without rupture (HCC) 09/02/2022   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   Lumbar foraminal stenosis 03/21/2020   S/P radical cystoprostatectomy 12/28/2019   S/p nephrectomy 12/28/2019   Renal mass 07/15/2019   Nonfunctioning kidney 07/14/2019   UTI (urinary tract infection) 06/25/2019   OSA (obstructive sleep apnea) 05/12/2019   Abnormal renal function 01/20/2019   Gastroesophageal reflux disease    AKI (acute kidney injury) (HCC) 12/22/2018   Bladder cancer (HCC) 11/09/2018   BCG cystitis 02/02/2018   Dysuria 11/20/2017   Pure hypercholesterolemia 10/02/2016   Malignant neoplasm of urinary bladder (HCC) 10/02/2016   Lipoma 09/30/2016   Obese 03/31/2016   S/P right TKA 03/30/2016   S/P knee replacement 03/30/2016   Essential hypertension 02/25/2016   Obesity (BMI 30-39.9) 01/07/2016   S/P shoulder replacement 07/20/2013   Shoulder arthritis 04/07/2013   Past Medical History:  Diagnosis Date   AICD (automatic cardioverter/defibrillator) present    [employer]   Anemia aprox. may-1-24   Arthritis    knees, shoulders, elbows   Bladder cancer [facility])    urologist-  dr Mena Goes   CHF (congestive heart failure) (HCC)    Chronic kidney disease    left kidney removed   Diverticulosis of colon    Fibromyalgia    GERD (gastroesophageal reflux disease)  History of diverticulitis of colon    History of gastric ulcer    due to aleve   Hypertension    Lower urinary tract symptoms (LUTS)    Myocardial infarction [facility]) may 1-24   OSA (obstructive sleep apnea)    Wears CPAP nightly   Oxygen deficiency    PONV (postoperative nausea and vomiting)    Psoriasis    Sepsis (HCC)    january 2021    Sleep apnea    Thyroid disease    biopsy on 10-08-22   Tinnitus    right ear more, has tranmitter in right ear removable at hs   Ulcer    Wears glasses    Wears partial dentures    Past Surgical History:  Procedure Laterality Date   APPENDECTOMY     BIV ICD INSERTION CRT-D N/A 03/01/2023   Procedure: BIV ICD INSERTION CRT-D;  Surgeon: Maurice Small, MD;  [facility];  Service: Cardiovascular;  Laterality: N/A;   COLECTOMY W/ COLOSTOMY  1996   W/   APPENDECTOMY   COLONOSCOPY N/A 05/11/2018   Procedure: COLONOSCOPY;  Surgeon: Corbin Ade, MD;  [facility];  Service: Endoscopy;  Laterality: N/A;  9:30   COLOSTOMY TAKEDOWN  1996   CYSTOSCOPY WITH BIOPSY N/A 12/05/2013   Procedure: CYSTO BLADDER BIOPSY AND FULGERATION;  Surgeon: Jerilee Field, MD;  Location: [facility];  Service: Urology;  Laterality: N/A;   CYSTOSCOPY WITH BIOPSY Bilateral 11/13/2014   Procedure: CYSTOSCOPY WITH  BLADDER BIOPSY FULGERATION AND BILATERAL RETROGRADE PYELOGRAMS;  Surgeon: Jerilee Field, MD;  Location: [facility];  Service: Urology;  Laterality: Bilateral;   CYSTOSCOPY WITH FULGERATION N/A 01/18/2018   Procedure: CYSTOSCOPY WITH FULGERATION/ BLADDER BIOPSY;  Surgeon: Jerilee Field, MD;  Location[facility];  Service: Urology;  Laterality: N/A;   CYSTOSCOPY WITH INJECTION N/A 11/09/2018   Procedure: CYSTOSCOPY WITH INJECTION OF INDOCYANINE GREEN DYE;  Surgeon: Sebastian Ache, MD;  Location: [facility];  Service: Urology;  Laterality: N/A;   CYSTOSCOPY WITH INSERTION OF UROLIFT N/A 01/18/2018   Procedure: CYSTOSCOPY WITH INSERTION OF UROLIFT;  Surgeon: Jerilee Field, MD;  Location[facility];  Service: Urology;  Laterality: N/A;   CYSTOSCOPY/URETEROSCOPY/HOLMIUM LASER Left 02/17/2019   Procedure: URETEROSCOPY WITH BALLOON DILATION  AND NEPHROSTOGRAM;  Surgeon: Sebastian Ache, MD;  Location: [facility];  Service: Urology;  Laterality: Left;  1 HR   EXCISION RIGHT UPPER ARM LIPOMA  2005   FRACTURE SURGERY     HEMORROIDECTOMY     INGUINAL HERNIA REPAIR Left 1984   IR NEPHROSTOMY PLACEMENT LEFT  01/05/2019   JOINT REPLACEMENT     both  knees, and both shoulders joints replaced.   KNEE ARTHROSCOPY Left X3  LAST ONE  2002   ORIF LEFT HUMEROUS FX  1976   POLYPECTOMY  05/11/2018   Procedure: POLYPECTOMY;  Surgeon: Corbin Ade, MD;  [facility];  Service: Endoscopy;;   PROSTATE SURGERY     RIGHT HEART CATH AND CORONARY ANGIOGRAPHY N/A 10/09/2022   Procedure: RIGHT HEART CATH AND CORONARY ANGIOGRAPHY;  Surgeon: [name], MD;  [facility];  Service: Cardiovascular;  Laterality: N/A;   ROBOT ASSISTED LAPAROSCOPIC NEPHRECTOMY Left 07/14/2019   Procedure: XI ROBOTIC ASSISTED LAPAROSCOPIC RETROPERITONEAL NEPHRECTOMY;  Surgeon: Sebastian Ache, MD;  Location: [facility];  Service: Urology;  Laterality: Left;  3 HRS   SMALL INTESTINE SURGERY  THYROIDECTOMY Bilateral 04/14/2023   Procedure: TOTAL THYROIDECTOMY;  Surgeon: [name], MD;  Location[facility];  Service: ENT;  Laterality: Bilateral;   TONSILLECTOMY  AS CHILD   TOTAL KNEE ARTHROPLASTY Left 2006   REVISION 2007  (AFTER Rasmussen & D WITH ANTIBIOTIC SPACER PROCEDURE FOR STEPH INFECTION)   TOTAL KNEE ARTHROPLASTY Right 03/30/2016   Procedure: RIGHT TOTAL KNEE ARTHROPLASTY;  Surgeon: Durene Romans, MD;  Location: [facility];  Service: Orthopedics;  Laterality: Right;   TOTAL SHOULDER ARTHROPLASTY Right 04/06/2013   Procedure: RIGHT TOTAL SHOULDER ARTHROPLASTY;  Surgeon: Senaida Lange, MD;  [facility];  Service: Orthopedics;  Laterality: Right;   TOTAL SHOULDER ARTHROPLASTY Left 07/20/2013   Procedure: LEFT TOTAL SHOULDER ARTHROPLASTY;  Surgeon: Senaida Lange, MD;  [facility];  Service: Orthopedics;  Laterality: Left;   TRANSURETHRAL RESECTION OF BLADDER TUMOR N/A 11/12/2015   Procedure:  TRANSURETHRAL RESECTION OF BLADDER TUMOR (TURBT);  Surgeon: Jerilee Field, MD;  Location: [facility];  Service: Urology;  Laterality: N/A;   TRANSURETHRAL RESECTION OF BLADDER TUMOR WITH GYRUS (TURBT-GYRUS) N/A 12/05/2013   Procedure: TRANSURETHRAL RESECTION OF BLADDER TUMOR WITH GYRUS (TURBT-GYRUS);  Surgeon: Jerilee Field, MD;  Location: [facility];  Service: Urology;  Laterality: N/A;   Social History   Tobacco Use   Smoking status: Former    Current packs/day: 0.00    Average packs/day: 2.0 packs/day for 40.0 years (80.0 ttl pk-yrs)    Types: Cigarettes    Start date: 06/01/1957    Quit date: 06/01/1997    Years since quitting: 26.2   Smokeless tobacco: Never  Vaping Use   Vaping status: Never Used  Substance Use Topics   Alcohol use: No   Drug use: No   Social History   Socioeconomic History   Marital status: Married    Spouse name: Billy Rasmussen   Number of children: 2   Years of education: 14   Highest education level: Associate degree: academic program  Occupational History   Occupation: retired    Comment: Pharmacologist, 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   Retired, Visual merchandiser   Social Drivers of Corporate investment banker Strain: Low Risk  (08/16/2023)   Overall Financial Resource Strain (CARDIA)    Difficulty of Paying Living Expenses: Not very hard  Recent Concern:  Financial Resource Strain - Medium Risk (07/05/2023)   Overall Financial Resource Strain (CARDIA)    Difficulty of Paying Living Expenses: Somewhat hard  Food Insecurity: No Food Insecurity (08/27/2023)   Hunger Vital Sign    Worried About Running Out of Food in the Last Year: Never true    Ran Out of Food in the Last Year: Never true  Transportation Needs: No Transportation Needs (08/27/2023)  PRAPARE - Administrator, Civil Service (Medical): No    Lack of Transportation (Non-Medical): No  Physical Activity: Insufficiently Active (08/16/2023)   Exercise Vital Sign    Days of Exercise per Week: 3 days    Minutes of Exercise per Session: 10 min  Stress: No Stress Concern Present (08/16/2023)   Billy Rasmussen of Occupational Health - Occupational Stress Questionnaire    Feeling of Stress : Not at all  Social Connections: Patient Declined (08/22/2023)   Social Connection and Isolation Panel [NHANES]    Frequency of Communication with Friends and Family: Patient declined    Frequency of Social Gatherings with Friends and Family: Patient declined    Attends Religious Services: Patient declined    Database administrator or Organizations: Patient declined    Attends Banker Meetings: Patient declined    Marital Status: Patient declined  Intimate Partner Violence: Not At Risk (08/27/2023)   Humiliation, Afraid, Rape, and Kick questionnaire    Fear of Current or Ex-Partner: No    Emotionally Abused: No    Physically Abused: No    Sexually Abused: No   Family Status  Relation Name Status   Mother  Deceased   Father BEN Brue Deceased   Sister  Deceased at age as a baby    Brother Financial planner   MGM  Deceased   MGF  Deceased   PGM  Deceased   PGF  Deceased   Daughter SHONDA Alive   Daughter EMILY Alive   Neg Hx  (Not Specified)  No partnership data on file   Family History  Problem Relation Age of Onset   Alzheimer's disease Mother    Heart attack Father     Heart disease Father    Arthritis Father    Hearing loss Father    Hypertension Father    Irregular heart beat Brother        DEFIB.   Congestive Heart Failure Brother    Heart disease Brother    Diabetes Daughter    Sleep apnea Neg Hx    Allergies  Allergen Reactions   Codeine Sulfate Other (See Comments)    Unknown       Review of Systems  Constitutional:  Negative for fever.  Respiratory:  Negative for cough and wheezing.   Cardiovascular:  Negative for chest pain and leg swelling.  Gastrointestinal:  Negative for constipation, diarrhea, nausea and vomiting.  Skin:  Negative for itching and rash.  Neurological:  Positive for dizziness. Negative for headaches.   Negative unless indicated in HPI   Objective:     BP 109/60   Pulse 78   Temp (!) 97.1 F (36.2 C) (Temporal)   Ht 5\' 6"  (1.676 m)   Wt 210 lb 6.4 oz (95.4 kg)   SpO2 97%   BMI 33.96 kg/m  BP Readings from Last 3 Encounters:  08/30/23 109/60  08/26/23 100/68  08/18/23 109/71   Wt Readings from Last 3 Encounters:  08/30/23 210 lb 6.4 oz (95.4 kg)  08/22/23 210 lb 5.1 oz (95.4 kg)  08/18/23 219 lb 3.2 oz (99.4 kg)      Physical Exam Vitals and nursing note reviewed.  Constitutional:      Appearance: Normal appearance.  HENT:     Head: Normocephalic and atraumatic.     Right Ear: Tympanic membrane, ear canal and external ear normal. There is no impacted cerumen.     Left Ear: Tympanic membrane, ear canal and  external ear normal. There is no impacted cerumen.     Nose: Nose normal.     Mouth/Throat:     Mouth: Mucous membranes are moist.  Eyes:     Conjunctiva/sclera: Conjunctivae normal.     Pupils: Pupils are equal, round, and reactive to light.  Cardiovascular:     Heart sounds: Normal heart sounds.  Pulmonary:     Effort: Pulmonary effort is normal.     Breath sounds: Normal breath sounds.  Abdominal:     General: Bowel sounds are normal.     Palpations: Abdomen is soft.   Musculoskeletal:        General: Normal range of motion.     Right lower leg: No edema.     Left lower leg: No edema.  Skin:    General: Skin is warm and dry.     Findings: No rash.  Neurological:     Mental Status: He is alert and oriented to person, place, and time.  Psychiatric:        Mood and Affect: Mood normal.        Behavior: Behavior normal.        Thought Content: Thought content normal.        Judgment: Judgment normal.      Results for orders placed or performed in visit on 08/30/23  CBC with Differential/Platelet  Result Value Ref Range   WBC 4.7 3.4 - 10.8 x10E3/uL   RBC 3.72 (L) 4.14 - 5.80 x10E6/uL   Hemoglobin 12.0 (L) 13.0 - 17.7 g/dL   Hematocrit 34.7 (L) 42.5 - 51.0 %   MCV 93 79 - 97 fL   MCH 32.3 26.6 - 33.0 pg   MCHC 34.7 31.5 - 35.7 g/dL   RDW 95.6 (H) 38.7 - 56.4 %   Platelets 144 (L) 150 - 450 x10E3/uL   Neutrophils 41 Not Estab. %   Lymphs 48 Not Estab. %   Monocytes 8 Not Estab. %   Eos 2 Not Estab. %   Basos 0 Not Estab. %   Neutrophils Absolute 1.9 1.4 - 7.0 x10E3/uL   Lymphocytes Absolute 2.3 0.7 - 3.1 x10E3/uL   Monocytes Absolute 0.4 0.1 - 0.9 x10E3/uL   EOS (ABSOLUTE) 0.1 0.0 - 0.4 x10E3/uL   Basophils Absolute 0.0 0.0 - 0.2 x10E3/uL   Immature Granulocytes 1 Not Estab. %   Immature Grans (Abs) 0.1 0.0 - 0.1 x10E3/uL  CMP14+EGFR  Result Value Ref Range   Glucose WILL FOLLOW    BUN WILL FOLLOW    Creatinine, Ser WILL FOLLOW    eGFR WILL FOLLOW    BUN/Creatinine Ratio WILL FOLLOW    Sodium WILL FOLLOW    Potassium WILL FOLLOW    Chloride WILL FOLLOW    CO2 WILL FOLLOW    Calcium WILL FOLLOW    Total Protein WILL FOLLOW    Albumin WILL FOLLOW    Globulin, Total WILL FOLLOW    Bilirubin Total WILL FOLLOW    Alkaline Phosphatase WILL FOLLOW    AST WILL FOLLOW    ALT WILL FOLLOW     Last CBC Lab Results  Component Value Date   WBC 4.7 08/30/2023   HGB 12.0 (L) 08/30/2023   HCT 34.6 (L) 08/30/2023   MCV 93 08/30/2023    MCH 32.3 08/30/2023   RDW 17.0 (H) 08/30/2023   PLT 144 (L) 08/30/2023   Last metabolic panel Lab Results  Component Value Date   GLUCOSE WILL FOLLOW 08/30/2023  NA WILL FOLLOW 08/30/2023   K WILL FOLLOW 08/30/2023   CL WILL FOLLOW 08/30/2023   CO2 WILL FOLLOW 08/30/2023   BUN WILL FOLLOW 08/30/2023   CREATININE WILL FOLLOW 08/30/2023   GFRNONAA 45 (L) 08/26/2023   CALCIUM WILL FOLLOW 08/30/2023   PHOS 2.6 08/22/2023   PROT WILL FOLLOW 08/30/2023   ALBUMIN WILL FOLLOW 08/30/2023   LABGLOB WILL FOLLOW 08/30/2023   AGRATIO 1.7 11/05/2022   BILITOT WILL FOLLOW 08/30/2023   ALKPHOS WILL FOLLOW 08/30/2023   AST WILL FOLLOW 08/30/2023   ALT WILL FOLLOW 08/30/2023   ANIONGAP 10 08/26/2023   Last lipids Lab Results  Component Value Date   CHOL 93 (L) 03/18/2023   HDL 21 (L) 03/18/2023   LDLCALC 33 03/18/2023   LDLDIRECT 96 02/25/2016   TRIG 253 (H) 03/18/2023   CHOLHDL 4.4 03/18/2023   Last hemoglobin A1c Lab Results  Component Value Date   HGBA1C 5.1 07/07/2022   Last thyroid functions Lab Results  Component Value Date   TSH 3.969 05/19/2023        Assessment & Plan:  Acute cystitis with hematuria  Leukopenia, unspecified type -     CBC with Differential/Platelet  CKD stage 3a, GFR 45-59 ml/min (HCC) [facility]  Hospital discharge follow-up -     CBC with Differential/Platelet -     CMP14+EGFR -     Spotted Fever Group Antibodies; Future   Billy Rasmussen is a 78 year old male seen today for posthospital discharge follow-up, no acute distress lab: Will recheck CBC and CMP prior hospital discharge Lyme serology in the spotted fever group above negative He is requested to have spotted fever recheck  Continue all previously prescribed medication encourage healthy lifestyle choices, including diet (rich in fruits, vegetables, and lean proteins, and low in salt and simple carbohydrates) and exercise (at least 30 minutes of moderate physical activity  daily). K. Sabato okay Rasmussen am tired like a centimeter   The above assessment and management plan was discussed with the patient. The patient verbalized understanding of and has agreed to the management plan. Patient is aware to call the clinic if they develop any new symptoms or if symptoms persist or worsen. Patient is aware when to return to the clinic for a follow-up visit. Patient educated on when it is appropriate to go to the emergency department.  Return if symptoms worsen or fail to improve.    Arrie Aran Santa Lighter, [facility] [address] [place], [place] [address] [phone]    Note: This document was prepared by [employer]ubin Milan voice dictation technology and any errors that results from this process are unintentional.

## 2023-08-31 ENCOUNTER — Encounter: Payer: Self-pay | Admitting: Oncology

## 2023-08-31 ENCOUNTER — Inpatient Hospital Stay: Payer: Medicare HMO | Attending: Internal Medicine

## 2023-08-31 ENCOUNTER — Inpatient Hospital Stay: Payer: Medicare HMO | Admitting: Internal Medicine

## 2023-08-31 VITALS — BP 116/52 | HR 62 | Temp 97.5°F | Resp 17 | Ht 66.0 in | Wt 211.8 lb

## 2023-08-31 DIAGNOSIS — D696 Thrombocytopenia, unspecified: Secondary | ICD-10-CM

## 2023-08-31 DIAGNOSIS — K769 Liver disease, unspecified: Secondary | ICD-10-CM | POA: Insufficient documentation

## 2023-08-31 DIAGNOSIS — C679 Malignant neoplasm of bladder, unspecified: Secondary | ICD-10-CM

## 2023-08-31 DIAGNOSIS — R161 Splenomegaly, not elsewhere classified: Secondary | ICD-10-CM | POA: Insufficient documentation

## 2023-08-31 DIAGNOSIS — Z8551 Personal history of malignant neoplasm of bladder: Secondary | ICD-10-CM | POA: Insufficient documentation

## 2023-08-31 DIAGNOSIS — Z95810 Presence of automatic (implantable) cardiac defibrillator: Secondary | ICD-10-CM | POA: Diagnosis not present

## 2023-08-31 DIAGNOSIS — Z85850 Personal history of malignant neoplasm of thyroid: Secondary | ICD-10-CM | POA: Diagnosis not present

## 2023-08-31 LAB — CBC WITH DIFFERENTIAL (CANCER CENTER ONLY)
Abs Immature Granulocytes: 0.08 10*3/uL — ABNORMAL HIGH (ref 0.00–0.07)
Basophils Absolute: 0 10*3/uL (ref 0.0–0.1)
Basophils Relative: 0 %
Eosinophils Absolute: 0.1 10*3/uL (ref 0.0–0.5)
Eosinophils Relative: 2 %
HCT: 33.8 % — ABNORMAL LOW (ref 39.0–52.0)
Hemoglobin: 11.5 g/dL — ABNORMAL LOW (ref 13.0–17.0)
Immature Granulocytes: 1 %
Lymphocytes Relative: 46 %
Lymphs Abs: 2.8 10*3/uL (ref 0.7–4.0)
MCH: 31.7 pg (ref 26.0–34.0)
MCHC: 34 g/dL (ref 30.0–36.0)
MCV: 93.1 fL (ref 80.0–100.0)
Monocytes Absolute: 0.7 10*3/uL (ref 0.1–1.0)
Monocytes Relative: 11 %
Neutro Abs: 2.4 10*3/uL (ref 1.7–7.7)
Neutrophils Relative %: 40 %
Platelet Count: 156 10*3/uL (ref 150–400)
RBC: 3.63 MIL/uL — ABNORMAL LOW (ref 4.22–5.81)
RDW: 17.3 % — ABNORMAL HIGH (ref 11.5–15.5)
WBC Count: 6.1 10*3/uL (ref 4.0–10.5)
nRBC: 0 % (ref 0.0–0.2)

## 2023-08-31 LAB — CBC WITH DIFFERENTIAL/PLATELET
Basophils Absolute: 0 10*3/uL (ref 0.0–0.2)
Basos: 0 %
EOS (ABSOLUTE): 0.1 10*3/uL (ref 0.0–0.4)
Eos: 2 %
Hematocrit: 34.6 % — ABNORMAL LOW (ref 37.5–51.0)
Hemoglobin: 12 g/dL — ABNORMAL LOW (ref 13.0–17.7)
Immature Grans (Abs): 0.1 10*3/uL (ref 0.0–0.1)
Immature Granulocytes: 1 %
Lymphocytes Absolute: 2.3 10*3/uL (ref 0.7–3.1)
Lymphs: 48 %
MCH: 32.3 pg (ref 26.6–33.0)
MCHC: 34.7 g/dL (ref 31.5–35.7)
MCV: 93 fL (ref 79–97)
Monocytes Absolute: 0.4 10*3/uL (ref 0.1–0.9)
Monocytes: 8 %
Neutrophils Absolute: 1.9 10*3/uL (ref 1.4–7.0)
Neutrophils: 41 %
Platelets: 144 10*3/uL — ABNORMAL LOW (ref 150–450)
RBC: 3.72 x10E6/uL — ABNORMAL LOW (ref 4.14–5.80)
RDW: 17 % — ABNORMAL HIGH (ref 11.6–15.4)
WBC: 4.7 10*3/uL (ref 3.4–10.8)

## 2023-08-31 LAB — CMP14+EGFR
ALT: 78 IU/L — ABNORMAL HIGH (ref 0–44)
AST: 123 IU/L — ABNORMAL HIGH (ref 0–40)
Albumin: 4.2 g/dL (ref 3.8–4.8)
Alkaline Phosphatase: 113 IU/L (ref 44–121)
BUN/Creatinine Ratio: 19 (ref 10–24)
BUN: 30 mg/dL — ABNORMAL HIGH (ref 8–27)
Bilirubin Total: 0.8 mg/dL (ref 0.0–1.2)
CO2: 21 mmol/L (ref 20–29)
Calcium: 9.1 mg/dL (ref 8.6–10.2)
Chloride: 103 mmol/L (ref 96–106)
Creatinine, Ser: 1.57 mg/dL — ABNORMAL HIGH (ref 0.76–1.27)
Globulin, Total: 3 g/dL (ref 1.5–4.5)
Glucose: 107 mg/dL — ABNORMAL HIGH (ref 70–99)
Potassium: 4.7 mmol/L (ref 3.5–5.2)
Sodium: 138 mmol/L (ref 134–144)
Total Protein: 7.2 g/dL (ref 6.0–8.5)
eGFR: 46 mL/min/{1.73_m2} — ABNORMAL LOW (ref >59)

## 2023-08-31 LAB — CMP (CANCER CENTER ONLY)
ALT: 68 U/L — ABNORMAL HIGH (ref 0–44)
AST: 98 U/L — ABNORMAL HIGH (ref 15–41)
Albumin: 4.2 g/dL (ref 3.5–5.0)
Alkaline Phosphatase: 98 U/L (ref 38–126)
Anion gap: 3 — ABNORMAL LOW (ref 5–15)
BUN: 32 mg/dL — ABNORMAL HIGH (ref 8–23)
CO2: 28 mmol/L (ref 22–32)
Calcium: 9.3 mg/dL (ref 8.9–10.3)
Chloride: 104 mmol/L (ref 98–111)
Creatinine: 1.44 mg/dL — ABNORMAL HIGH (ref 0.61–1.24)
GFR, Estimated: 51 mL/min — ABNORMAL LOW (ref >60)
Glucose, Bld: 68 mg/dL — ABNORMAL LOW (ref 70–99)
Potassium: 4.5 mmol/L (ref 3.5–5.1)
Sodium: 135 mmol/L (ref 135–145)
Total Bilirubin: 0.8 mg/dL (ref 0.0–1.2)
Total Protein: 7.6 g/dL (ref 6.5–8.1)

## 2023-08-31 NOTE — Progress Notes (Signed)
 [facility] [phone]   Fax:([phone]) [phone]  OFFICE PROGRESS NOTE  Raliegh Ip, DO [address] [place] [address]  DIAGNOSIS:  1) Splenomegaly of unclear etiology but there was suspicious liver lesion on the ultrasound of the abdomen. 2) history of bladder cancer status post TURBT under the care of Dr. Mena Goes diagnosed in July 2015 and followed by urology.  PRIOR THERAPY: None  CURRENT THERAPY: None  INTERVAL HISTORY: Billy Rasmussen 78 y.o. male returns to the clinic today for follow-up visit accompanied by his wife. Discussed the use of AI scribe software for clinical note transcription with the patient, who gave verbal consent to proceed.  History of Present Illness   Billy Rasmussen is a 78 year old male with splenomegaly and a suspicious liver lesion who presents for evaluation and repeat CT scan. He is accompanied by his wife.  He has a history of splenomegaly of unclear etiology and a suspicious liver lesion noted on a previous abdominal ultrasound. Recent CT scans during hospitalization showed no concerning findings, but further evaluation is necessary.  He was recently hospitalized for [facility] spotted fever, which required a five-day stay, including three days in intensive care. This occurred last week after he began feeling unwell the previous Wednesday, with worsening symptoms by Thursday. By Friday, he was unable to attend a planned flea market visit. Initially suspecting a urinary tract infection, he was later diagnosed with [facility] spotted fever after blood tests showed unusual results. He has since recovered with no ongoing issues related to this condition.  His past medical history is significant for bladder cancer, status post therapy in July 2015, and he is followed by urology. He also has a history of thyroid cancer, for which he underwent thyroidectomy and received a radiation pill in January. Additionally,  he had a pacemaker placed in September of the previous year.  During the review of symptoms, it was noted that he still has mild anemia with a hemoglobin level of 11.5, but his white blood cell count and platelet count are normal. No new symptoms or concerns at this time.       MEDICAL HISTORY: Past Medical History:  Diagnosis Date   AICD (automatic cardioverter/defibrillator) present    [employer]   Anemia aprox. may-1-24   Arthritis    knees, shoulders, elbows   Bladder cancer [facility])    urologist-  dr Mena Goes   CHF (congestive heart failure) (HCC)    Chronic kidney disease    left kidney removed   Diverticulosis of colon    Fibromyalgia    GERD (gastroesophageal reflux disease)    History of diverticulitis of colon    History of gastric ulcer    due to aleve   Hypertension    Lower urinary tract symptoms (LUTS)    Myocardial infarction (HCC) may 1-24   OSA (obstructive sleep apnea)    Wears CPAP nightly   Oxygen deficiency    PONV (postoperative nausea and vomiting)    Psoriasis    Sepsis (HCC)    january 2021   Sleep apnea    Thyroid disease    biopsy on 10-08-22   Tinnitus    right ear more, has tranmitter in right ear removable at hs   Ulcer    Wears glasses    Wears partial dentures     ALLERGIES:  is allergic to codeine sulfate.  MEDICATIONS:  Current Outpatient Medications  Medication  Sig Dispense Refill   Acetaminophen Extra Strength 500 MG TABS Take 2 tablets by mouth every 6 (six) hours as needed (fever, pain).     atorvastatin (LIPITOR) 10 MG tablet Take 1 tablet (10 mg total) by mouth daily. 90 tablet 3   azelastine (ASTELIN) 0.1 % nasal spray Place 1 spray into both nostrils 2 (two) times daily. Use in each nostril as directed 30 mL 12   colchicine 0.6 MG tablet Take 0.6 mg by mouth daily.     doxycycline (VIBRA-TABS) 100 MG tablet Take 1 tablet (100 mg total) by mouth every 12 (twelve) hours for 8 days. 16 tablet 0   furosemide (LASIX) 20 MG  tablet Take 1 tablet (20 mg) daily as needed if you gain more than 3 pounds in 1 day or 5 pounds in 1 week. 45 tablet 3   levothyroxine (SYNTHROID) 112 MCG tablet Take 112 mcg by mouth every morning.     losartan (COZAAR) 25 MG tablet Take 1 tablet (25 mg total) by mouth daily. 90 tablet 3   metoprolol succinate (TOPROL-XL) 50 MG 24 hr tablet Take 1 tablet by mouth daily.     Multiple Vitamin (MULTIVITAMIN) capsule Take 1 capsule by mouth daily.     oxyCODONE (OXY IR/ROXICODONE) 5 MG immediate release tablet Take 5 mg by mouth every 6 (six) hours as needed for moderate pain (pain score 4-6).     polyvinyl alcohol (LIQUIFILM TEARS) 1.4 % ophthalmic solution Place 1 drop into both eyes as needed for dry eyes. 15 mL 0   spironolactone (ALDACTONE) 25 MG tablet Take 12.5 mg by mouth daily.     No current facility-administered medications for this visit.    SURGICAL HISTORY:  Past Surgical History:  Procedure Laterality Date   APPENDECTOMY     BIV ICD INSERTION CRT-D N/A 03/01/2023   Procedure: BIV ICD INSERTION CRT-D;  Surgeon: Mealor, Roberts Gaudy, MD;  Location: [facility] INVASIVE CV LAB;  Service: Cardiovascular;  Laterality: N/A;   COLECTOMY W/ COLOSTOMY  1996   W/   APPENDECTOMY   COLONOSCOPY N/A 05/11/2018   Procedure: COLONOSCOPY;  Surgeon: Corbin Ade, MD;  [facility];  Service: Endoscopy;  Laterality: N/A;  9:30   COLOSTOMY TAKEDOWN  1996   CYSTOSCOPY WITH BIOPSY N/A 12/05/2013   Procedure: CYSTO BLADDER BIOPSY AND FULGERATION;  Surgeon: Jerilee Field, MD;  Location: [facility];  Service: Urology;  Laterality: N/A;   CYSTOSCOPY WITH BIOPSY Bilateral 11/13/2014   Procedure: CYSTOSCOPY WITH  BLADDER BIOPSY FULGERATION AND BILATERAL RETROGRADE PYELOGRAMS;  Surgeon: Jerilee Field, MD;  Location: [facility];  Service: Urology;  Laterality: Bilateral;   CYSTOSCOPY WITH FULGERATION N/A 01/18/2018   Procedure: CYSTOSCOPY WITH FULGERATION/ BLADDER  BIOPSY;  Surgeon: Jerilee Field, MD;  Location: [facility];  Service: Urology;  Laterality: N/A;   CYSTOSCOPY WITH INJECTION N/A 11/09/2018   Procedure: CYSTOSCOPY WITH INJECTION OF INDOCYANINE GREEN DYE;  Surgeon: Sebastian Ache, MD;  Location: [facility];  Service: Urology;  Laterality: N/A;   CYSTOSCOPY WITH INSERTION OF UROLIFT N/A 01/18/2018   Procedure: CYSTOSCOPY WITH INSERTION OF UROLIFT;  Surgeon: Jerilee Field, MD;  Location: [facility];  Service: Urology;  Laterality: N/A;   CYSTOSCOPY/URETEROSCOPY/HOLMIUM LASER Left 02/17/2019   Procedure: URETEROSCOPY WITH BALLOON DILATION  AND NEPHROSTOGRAM;  Surgeon: Sebastian Ache, MD;  Location: [facility];  Service: Urology;  Laterality: Left;  1 HR   EXCISION RIGHT UPPER ARM LIPOMA  2005   FRACTURE SURGERY     HEMORROIDECTOMY     INGUINAL HERNIA REPAIR Left 1984   IR NEPHROSTOMY PLACEMENT LEFT  01/05/2019   JOINT REPLACEMENT     both  knees, and both shoulders joints replaced.   KNEE ARTHROSCOPY Left X3  LAST ONE  2002   ORIF LEFT HUMEROUS FX  1976   POLYPECTOMY  05/11/2018   Procedure: POLYPECTOMY;  Surgeon: Corbin Ade, MD;  [facility];  Service: Endoscopy;;   PROSTATE SURGERY     RIGHT HEART CATH AND CORONARY ANGIOGRAPHY N/A 10/09/2022   Procedure: RIGHT HEART CATH AND CORONARY ANGIOGRAPHY;  Surgeon: [name], MD;  Location: [facility] INVASIVE CV LAB;  Service: Cardiovascular;  Laterality: N/A;   ROBOT ASSISTED LAPAROSCOPIC NEPHRECTOMY Left 07/14/2019   Procedure: XI ROBOTIC ASSISTED LAPAROSCOPIC RETROPERITONEAL NEPHRECTOMY;  Surgeon: Sebastian Ache, MD;  Location: [facility];  Service: Urology;  Laterality: Left;  3 HRS   SMALL INTESTINE SURGERY     THYROIDECTOMY Bilateral 04/14/2023   Procedure: TOTAL THYROIDECTOMY;  Surgeon: [name], MD;  Location: [facility] OR;  Service: ENT;  Laterality: Bilateral;   TONSILLECTOMY  AS CHILD   TOTAL KNEE ARTHROPLASTY Left 2006    REVISION 2007  (AFTER I & D WITH ANTIBIOTIC SPACER PROCEDURE FOR STEPH INFECTION)   TOTAL KNEE ARTHROPLASTY Right 03/30/2016   Procedure: RIGHT TOTAL KNEE ARTHROPLASTY;  Surgeon: Durene Romans, MD;  Location: [facility];  Service: Orthopedics;  Laterality: Right;   TOTAL SHOULDER ARTHROPLASTY Right 04/06/2013   Procedure: RIGHT TOTAL SHOULDER ARTHROPLASTY;  Surgeon: Senaida Lange, MD;  Location: [facility] OR;  Service: Orthopedics;  Laterality: Right;   TOTAL SHOULDER ARTHROPLASTY Left 07/20/2013   Procedure: LEFT TOTAL SHOULDER ARTHROPLASTY;  Surgeon: Senaida Lange, MD;  Location: [facility];  Service: Orthopedics;  Laterality: Left;   TRANSURETHRAL RESECTION OF BLADDER TUMOR N/A 11/12/2015   Procedure: TRANSURETHRAL RESECTION OF BLADDER TUMOR (TURBT);  Surgeon: Jerilee Field, MD;  Location: [facility];  Service: Urology;  Laterality: N/A;   TRANSURETHRAL RESECTION OF BLADDER TUMOR WITH GYRUS (TURBT-GYRUS) N/A 12/05/2013   Procedure: TRANSURETHRAL RESECTION OF BLADDER TUMOR WITH GYRUS (TURBT-GYRUS);  Surgeon: Jerilee Field, MD;  Location: [facility];  Service: Urology;  Laterality: N/A;    REVIEW OF SYSTEMS:  A comprehensive review of systems was negative except for: Constitutional: positive for fatigue   PHYSICAL EXAMINATION: General appearance: alert, cooperative, fatigued, and no distress Head: Normocephalic, without obvious abnormality, atraumatic Neck: no adenopathy, no JVD, supple, symmetrical, trachea midline, and thyroid not enlarged, symmetric, no tenderness/mass/nodules Lymph nodes: Cervical, supraclavicular, and axillary nodes normal. Resp: clear to auscultation bilaterally Back: symmetric, no curvature. ROM normal. No CVA tenderness. Cardio: regular rate and rhythm, S1, S2 normal, no murmur, click, rub or gallop GI: soft, non-tender; bowel sounds normal; no masses,  no organomegaly Extremities: extremities normal, atraumatic, no cyanosis or edema  ECOG  PERFORMANCE STATUS: 1 - Symptomatic but completely ambulatory  Blood pressure (!) 116/52, pulse 62, temperature (!) 97.5 F (36.4 C), temperature source Temporal, resp. rate 17, height 5\' 6"  (1.676 m), weight 211 lb 12.8 oz (96.1 kg), SpO2 100%.  LABORATORY DATA: Lab Results  Component Value Date   WBC 6.1 08/31/2023   HGB 11.5 (L) 08/31/2023   HCT 33.8 (L) 08/31/2023   MCV 93.1 08/31/2023   PLT 156 08/31/2023      Chemistry      Component Value Date/Time   NA 138 08/30/2023 0840  K 4.7 08/30/2023 0840   CL 103 08/30/2023 0840   CO2 21 08/30/2023 0840   BUN 30 (H) 08/30/2023 0840   CREATININE 1.57 (H) 08/30/2023 0840   CREATININE 1.47 (H) 11/11/2022 1053      Component Value Date/Time   CALCIUM 9.1 08/30/2023 0840   ALKPHOS 113 08/30/2023 0840   AST 123 (H) 08/30/2023 0840   AST 43 (H) 11/11/2022 1053   ALT 78 (H) 08/30/2023 0840   ALT 25 11/11/2022 1053   BILITOT 0.8 08/30/2023 0840   BILITOT 0.8 11/11/2022 1053       RADIOGRAPHIC STUDIES: US Abdomen Limited RUQ (LIVER/GB) Result Date: 08/25/2023 CLINICAL DATA:  Transaminitis. EXAM: ULTRASOUND ABDOMEN LIMITED RIGHT UPPER QUADRANT COMPARISON:  CT abdomen and pelvis 08/22/2023 FINDINGS: Gallbladder: Gallstones are present measuring up to 9 mm. There is no gallbladder wall thickening or pericholecystic fluid. No sonographic Murphy sign noted by sonographer. Common bile duct: Diameter: 3.7 mm Liver: No focal lesion identified. Within normal limits in parenchymal echogenicity. Portal vein is patent on color Doppler imaging with normal direction of blood flow towards the liver. Other: None. IMPRESSION: Cholelithiasis without sonographic evidence of acute cholecystitis. Electronically Signed   By: Darliss Cheney M.D.   On: 08/25/2023 15:26   CT CHEST WO CONTRAST Result Date: 08/23/2023 CLINICAL DATA:  Fever, chest pain.  History of pericarditis. EXAM: CT CHEST WITHOUT CONTRAST TECHNIQUE: Multidetector CT imaging of the chest was  performed following the standard protocol without IV contrast. RADIATION DOSE REDUCTION: This exam was performed according to the departmental dose-optimization program which includes automated exposure control, adjustment of the mA and/or kV according to patient size and/or use of iterative reconstruction technique. COMPARISON:  None Available. FINDINGS: Cardiovascular: LEFT-sided pacer with leads along the RIGHT heart. The no pericardial fluid. Aorta and great vessels normal on noncontrast exam. No pericardial fluid. Mediastinum/Nodes: No axillary or supraclavicular adenopathy. No mediastinal or hilar adenopathy. No pericardial fluid. Esophagus normal. Lungs/Pleura: No pulmonary infarction. No pneumonia. No pleural fluid. No pneumothorax Upper Abdomen: 2 months no prior multiple gallstones within nondistended gallbladder. Musculoskeletal: No aggressive osseous lesion. IMPRESSION: 1. No evidence of pericarditis. 2. No pulmonary infarction or pneumonia. 3. Cholelithiasis. Electronically Signed   By: Genevive Bi M.D.   On: 08/23/2023 13:32   CT ABDOMEN PELVIS WO CONTRAST Result Date: 08/22/2023 CLINICAL DATA:  Right-sided abdominal pain and fever. EXAM: CT ABDOMEN AND PELVIS WITHOUT CONTRAST TECHNIQUE: Multidetector CT imaging of the abdomen and pelvis was performed following the standard protocol without IV contrast. RADIATION DOSE REDUCTION: This exam was performed according to the departmental dose-optimization program which includes automated exposure control, adjustment of the mA and/or kV according to patient size and/or use of iterative reconstruction technique. COMPARISON:  05/19/2023 and MR abdomen 02/18/2023. FINDINGS: Lower chest: Minimal mucoid impaction in the right lower lobe. Mild dependent atelectasis. Heart is enlarged. No pericardial or pleural effusion. Distal esophagus is grossly unremarkable. Hepatobiliary: Liver is grossly unremarkable. Gallstones. No biliary ductal dilatation. Pancreas:  Negative. Spleen: Negative. Adrenals/Urinary Tract: Right adrenal gland and right kidney are grossly unremarkable. Right ureter is decompressed. Left adrenalectomy and left nephrectomy. Cysto prostatectomy with a right lower quadrant ileal conduit. Stomach/Bowel: Right ileal conduit formation. Small bowel, right inguinal hernia contains unobstructed small bowel. Stomach, small bowel and colon are otherwise unremarkable. Vascular/Lymphatic: Atherosclerotic calcification of the aorta. No pathologically enlarged lymph nodes. Reproductive: Prostatectomy. Other: Right inguinal hernia contains unobstructed small bowel. Left inguinal hernia contains fat. Mesenteries and peritoneum are unremarkable. Musculoskeletal: Degenerative changes  in the spine. IMPRESSION: 1. No acute findings to explain the patient's pain or fever. 2. Cholelithiasis. 3. Cysto prostatectomy with a right lower quadrant ileal conduit. 4. Right inguinal hernia contains unobstructed small bowel. 5.  Aortic atherosclerosis (ICD10-I70.0). Electronically Signed   By: Leanna Battles M.D.   On: 08/22/2023 14:49   DG Chest 2 View Result Date: 08/21/2023 CLINICAL DATA:  r/o PNA EXAM: CHEST - 2 VIEW COMPARISON:  Chest x-ray 05/29/2023. FINDINGS: Left chest wall triple lead cardiac pacemaker/defibrillator. The heart and mediastinal contours are unchanged. Atherosclerotic plaque. Similar pericentimeter round density overlying the right lower lung zone suggestive of a nipple shadow. No focal consolidation. No pulmonary edema. No pleural effusion. No pneumothorax. No acute osseous abnormality. Bilateral total shoulder arthroplasty. IMPRESSION: 1. No active cardiopulmonary disease. 2.  Aortic Atherosclerosis (ICD10-I70.0). Electronically Signed   By: Tish Frederickson M.D.   On: 08/21/2023 21:30    ASSESSMENT AND PLAN: This is a very pleasant 78 years old white male presented for evaluation of splenomegaly and was found also to have thrombocytopenia.  The repeat  ultrasound of the abdomen showed normal spleen but there was a suspicious hypoechoic lesion in the right lobe of the liver.  The patient has history of alcohol abuse in the past and hepatocellular carcinoma could not be completely ruled out at this point.  He was supposed to have MRI of the liver but with the foreign material in his eye he would not be able to get the MRI done. The patient is doing fine today with no concerning complaints except for mild fatigue.    Splenomegaly of unclear etiology Recent CT scan of the chest, abdomen, and pelvis showed no concerning findings. Blood work shows mild anemia with hemoglobin at 11.5, normal platelet count, and no drop in white blood count. No current oncological concerns warranting further follow-up with hematology/oncology. - Discharge from hematology/oncology care - Advise follow-up with family doctor  Suspicious liver lesion A previous ultrasound indicated a suspicious liver lesion. Recent CT scan of the chest, abdomen, and pelvis showed no concerning findings. No current oncological concerns warranting further follow-up with hematology/oncology. - Discharge from hematology/oncology care - Advise follow-up with family doctor  Bladder cancer Status post therapy in July 2015, currently followed by urology. No new concerns or findings related to bladder cancer during this visit.  Thyroid cancer Thyroidectomy and radioactive iodine treatment completed in January. No new concerns or findings related to thyroid cancer during this visit.  Pacemaker placement Pacemaker placement in September of the previous year. No new concerns or issues related to the pacemaker during this visit.  [place] spotted fever Recent hospitalization for [facility] spotted fever, requiring five days of hospitalization, including three days in intensive care. He has recovered well from this episode. - Advise precautions when working outdoors to prevent tick bites      The patient voices understanding of current disease status and treatment options and is in agreement with the current care plan.  All questions were answered. The patient knows to call the clinic with any problems, questions or concerns. We can certainly see the patient much sooner if necessary. The total time spent in the appointment was 20 minutes.  Disclaimer: This note was dictated with voice recognition software. Similar sounding words can inadvertently be transcribed and may not be corrected upon review.

## 2023-09-02 ENCOUNTER — Ambulatory Visit (INDEPENDENT_AMBULATORY_CARE_PROVIDER_SITE_OTHER): Payer: Medicare HMO

## 2023-09-02 DIAGNOSIS — I5042 Chronic combined systolic (congestive) and diastolic (congestive) heart failure: Secondary | ICD-10-CM | POA: Diagnosis not present

## 2023-09-03 LAB — CUP PACEART REMOTE DEVICE CHECK
Battery Remaining Longevity: 95 mo
Battery Remaining Percentage: 93 %
Battery Voltage: 3.01 V
Brady Statistic AP VP Percent: 23 %
Brady Statistic AP VS Percent: 1 %
Brady Statistic AS VP Percent: 76 %
Brady Statistic AS VS Percent: 1 %
Brady Statistic RA Percent Paced: 23 %
Date Time Interrogation Session: 20250403084310
HighPow Impedance: 73 Ohm
Implantable Lead Connection Status: 753985
Implantable Lead Connection Status: 753985
Implantable Lead Connection Status: 753985
Implantable Lead Implant Date: 20240930
Implantable Lead Implant Date: 20240930
Implantable Lead Implant Date: 20240930
Implantable Lead Location: 753858
Implantable Lead Location: 753859
Implantable Lead Location: 753860
Implantable Pulse Generator Implant Date: 20240930
Lead Channel Impedance Value: 1225 Ohm
Lead Channel Impedance Value: 360 Ohm
Lead Channel Impedance Value: 450 Ohm
Lead Channel Pacing Threshold Amplitude: 0.5 V
Lead Channel Pacing Threshold Amplitude: 0.75 V
Lead Channel Pacing Threshold Amplitude: 1.625 V
Lead Channel Pacing Threshold Pulse Width: 0.4 ms
Lead Channel Pacing Threshold Pulse Width: 0.4 ms
Lead Channel Pacing Threshold Pulse Width: 0.5 ms
Lead Channel Sensing Intrinsic Amplitude: 1.1 mV
Lead Channel Sensing Intrinsic Amplitude: 11.8 mV
Lead Channel Setting Pacing Amplitude: 1.5 V
Lead Channel Setting Pacing Amplitude: 1.75 V
Lead Channel Setting Pacing Amplitude: 2.125
Lead Channel Setting Pacing Pulse Width: 0.4 ms
Lead Channel Setting Pacing Pulse Width: 0.4 ms
Lead Channel Setting Sensing Sensitivity: 0.5 mV
Pulse Gen Serial Number: 211030215
Zone Setting Status: 755011

## 2023-09-05 ENCOUNTER — Other Ambulatory Visit: Payer: Self-pay | Admitting: Family Medicine

## 2023-09-05 DIAGNOSIS — R051 Acute cough: Secondary | ICD-10-CM

## 2023-09-08 ENCOUNTER — Telehealth: Payer: Self-pay | Admitting: Family Medicine

## 2023-09-10 ENCOUNTER — Other Ambulatory Visit: Payer: Medicare Other

## 2023-09-12 ENCOUNTER — Encounter: Payer: Self-pay | Admitting: Cardiovascular Disease

## 2023-09-13 ENCOUNTER — Other Ambulatory Visit: Payer: Self-pay

## 2023-09-13 ENCOUNTER — Other Ambulatory Visit

## 2023-09-13 ENCOUNTER — Telehealth: Payer: Self-pay

## 2023-09-13 DIAGNOSIS — E039 Hypothyroidism, unspecified: Secondary | ICD-10-CM

## 2023-09-13 MED ORDER — METOPROLOL SUCCINATE ER 50 MG PO TB24: 50.0000 mg | ORAL_TABLET | Freq: Every day | ORAL | 0 refills | Status: DC

## 2023-09-13 MED ORDER — FUROSEMIDE 20 MG PO TABS: ORAL_TABLET | ORAL | 0 refills | Status: DC

## 2023-09-13 NOTE — Telephone Encounter (Signed)
 When pt came in today for labs he asked if Dr. Crissie Dome could send in Furosemide 20 prn and Metoprolol 50 every day. This was ok'd by Dr. Bonnell Butcher. Pt has follow up 4/29.  Left message making pt aware.

## 2023-09-14 ENCOUNTER — Encounter: Payer: Self-pay | Admitting: Family Medicine

## 2023-09-14 LAB — TSH+FREE T4
Free T4: 1.39 ng/dL (ref 0.82–1.77)
TSH: 4.19 u[IU]/mL (ref 0.450–4.500)

## 2023-09-17 ENCOUNTER — Encounter: Payer: Medicare HMO | Admitting: Family Medicine

## 2023-09-17 NOTE — Patient Instructions (Signed)
 Risk Modification in those with ascending thoracic aortic aneurysm:  Continue good control of blood pressure (prefer SBP 130/80 or less)  2. Avoid fluoroquinolone antibiotics (I.e Ciprofloxacin, Avelox, Levofloxacin, Ofloxacin)  3.  Use of statin (to decrease cardiovascular risk)  4.  Exercise and activity limitations is individualized, but in general, contact sports are to be  avoided and one should avoid heavy lifting (defined as half of ideal body weight) and exercises involving sustained Valsalva maneuver.  5. Counseling for those suspected of having genetically mediated disease. First-degree relatives of those with TAA disease should be screened as well as those who have a connective tissue disease (I.e with Marfan syndrome, Ehlers-Danlos syndrome,  and Loeys-Dietz syndrome) or a  bicuspid aortic valve,have an increased risk for  complications related to TAA. He/She denies family history of TAA and personal history of connective tissue disorder.   6. If one has tobacco abuse, smoking cessation is highly encouraged.

## 2023-09-17 NOTE — Progress Notes (Unsigned)
 [address]       [place] [address]             [phone]      I connected with  Billy Rasmussen on 09/28/23 by a telephone and verified that I am speaking with the correct person using two identifiers.   I discussed the limitations of evaluation and management by telemedicine. The patient expressed understanding and agreed to proceed.  History of Present Illness: Billy Rasmussen is a 78 year old male with a past medical history of bladder cancer, history of left nephrectomy, splenomegaly, thrombocytopenia, hypertension, chronic systolic heart failure, stage IIIa CKD, status post recent biventricular pacemaker, recent [place] spotted fever leading to hospitalization and recent recurrent pericarditis treated with ASA and colchicine . He was referred to our clinic in 2024 after he was incidentally found to have a 4.5 ascending aorta with a 4.1 aortic root on echocardiogram during a work up by cardiology for chest pain and this was again noted on chest CT ordered by his PCP for fatigue and dyspnea on exertion.  Today he reports that he has had a rough year including a bout of the flu, 2 upper respiratory infections, thyroidectomy, 2 episodes of pericarditis treated with colchicine  and ASA and rocky mountain spotted fever leading to hospitalization. He currently denies chest pain and shortness of breath since December when he was last treated for pericarditis. He does admit to some lightheadedness at times but denies loss of consciousness. He does admit to lower extremity edema but tries to stay on top of this with his fluid pills. 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  He measures his blood pressure most days and I it usually around 133/74 and his heart rate is usually in the 60s.    Current Outpatient Medications on File Prior to Visit  Medication Sig Dispense Refill   Acetaminophen  Extra Strength 500 MG TABS Take 2 tablets by mouth every 6 (six) hours as  needed (fever, pain).     atorvastatin  (LIPITOR) 10 MG tablet Take 1 tablet (10 mg total) by mouth daily. 90 tablet 3   azelastine  (ASTELIN ) 0.1 % nasal spray Place 1 spray into both nostrils 2 (two) times daily. Use in each nostril as directed 30 mL 12   colchicine  0.6 MG tablet Take 0.6 mg by mouth daily.     furosemide  (LASIX ) 20 MG tablet Take 1 tablet (20 mg) daily as needed if you gain more than 3 pounds in 1 day or 5 pounds in 1 week. 45 tablet 0   levothyroxine  (SYNTHROID ) 112 MCG tablet Take 112 mcg by mouth every morning.     losartan  (COZAAR ) 25 MG tablet Take 1 tablet (25 mg total) by mouth daily. 90 tablet 3   metoprolol  succinate (TOPROL -XL) 50 MG 24 hr tablet Take 1 tablet (50 mg total) by mouth daily. 90 tablet 0   Multiple Vitamin (MULTIVITAMIN) capsule Take 1 capsule by mouth daily.     oxyCODONE  (OXY IR/ROXICODONE ) 5 MG immediate release tablet Take 5 mg by mouth every 6 (six) hours as needed for moderate pain (pain score 4-6).     polyvinyl alcohol  (LIQUIFILM TEARS) 1.4 % ophthalmic solution Place 1 drop into both eyes as needed for dry eyes. 15 mL 0   spironolactone  (ALDACTONE ) 25 MG tablet Take 12.5 mg by mouth daily.     No current facility-administered medications on file prior to visit.    CTA Results:  CLINICAL DATA:  Fever, chest pain.  History of pericarditis.   EXAM: CT CHEST WITHOUT CONTRAST   TECHNIQUE: Multidetector CT imaging of the chest was performed following the standard protocol without IV contrast.   RADIATION DOSE REDUCTION: This exam was performed according to the departmental dose-optimization program which includes automated exposure control, adjustment of the mA and/or kV according to patient size and/or use of iterative reconstruction technique.   COMPARISON:  None Available.   FINDINGS: Cardiovascular: LEFT-sided pacer with leads along the RIGHT heart. The no pericardial fluid. Aorta and great vessels normal on noncontrast exam. No  pericardial fluid.   Mediastinum/Nodes: No axillary or supraclavicular adenopathy. No mediastinal or hilar adenopathy. No pericardial fluid. Esophagus normal.   Lungs/Pleura: No pulmonary infarction. No pneumonia. No pleural fluid. No pneumothorax   Upper Abdomen: 2 months no prior multiple gallstones within nondistended gallbladder.   Musculoskeletal: No aggressive osseous lesion.   IMPRESSION: 1. No evidence of pericarditis. 2. No pulmonary infarction or pneumonia. 3. Cholelithiasis.     Electronically Signed   By: Deboraha Fallow M.D.   On: 08/23/2023 13:32   CLINICAL DATA:  Left neck/chest pain   EXAM: CT ANGIOGRAPHY CHEST, ABDOMEN AND PELVIS   TECHNIQUE: Non-contrast CT of the chest was initially obtained.   Multidetector CT imaging through the chest, abdomen and pelvis was performed using the standard protocol during bolus administration of intravenous contrast. Multiplanar reconstructed images and MIPs were obtained and reviewed to evaluate the vascular anatomy.   RADIATION DOSE REDUCTION: This exam was performed according to the departmental dose-optimization program which includes automated exposure control, adjustment of the mA and/or kV according to patient size and/or use of iterative reconstruction technique.   CONTRAST:  85mL OMNIPAQUE  IOHEXOL  350 MG/ML SOLN   COMPARISON:  CT chest dated 05/12/2017   FINDINGS: CTA CHEST FINDINGS   Cardiovascular: On unenhanced CT, there is no evidence of intramural hematoma.   Following contrast administration, there is no evidence of thoracic aortic aneurysm or dissection. Mild atherosclerotic calcifications of the aortic arch.   Although not tailored for evaluation of the pulmonary arteries, there is no evidence of central pulmonary embolism to the lobar level.   The heart is top-normal in size. Small pericardial effusion, mildly progressive, measuring simple fluid density. Left subclavian ICD.    Moderate three-vessel coronary atherosclerosis.   Mediastinum/Nodes: No suspicious mediastinal lymphadenopathy.   Visualized thyroid  is unremarkable.   Lungs/Pleura: Mild centrilobular emphysematous changes, upper lung predominant.   Trace left pleural effusion, grossly unchanged. Mild left basilar atelectasis.   No focal consolidation.   No suspicious pulmonary nodules.   No pneumothorax.   Musculoskeletal: Mild degenerative changes of the thoracic spine. Bilateral shoulder arthroplasties. No fracture is seen.   Review of the MIP images confirms the above findings.   CTA ABDOMEN AND PELVIS FINDINGS   VASCULAR   Aorta: No evidence abdominal aortic aneurysm or dissection. Mild atherosclerotic calcifications. Patent.   Celiac: Patent.   SMA: Patent.   Renals: Patent bilaterally.   IMA: Patent.   Inflow: Patent bilaterally.  Mild atherosclerotic calcifications.   Veins: Grossly unremarkable.   Review of the MIP images confirms the above findings.   NON-VASCULAR   Hepatobiliary: Liver is within normal limits.   Layering small gallstones, without associated inflammatory changes. No intrahepatic or extrahepatic ductal dilatation.   Pancreas: 10 mm hypervascular lesion in the pancreatic uncinate process (series 6/image 127), possibly reflecting a small pancreatic neuroendocrine tumor, less likely a vascular lesion. No pancreatic atrophy  or ductal dilatation.   Spleen: Within normal limits.   Adrenals/Urinary Tract: Left adrenal gland is likely surgically absent. Status post left nephrectomy. No abnormal soft tissue in the surgical bed.   Right adrenal gland is within normal limits.   Right kidney is within normal limits.  No hydronephrosis.   Status post cystectomy with right mid abdominal ileal conduit.   Stomach/Bowel: Stomach is within normal limits.   No evidence of bowel obstruction.   Suspected prior appendectomy.   Left colonic  diverticulosis, without evidence of diverticulitis.   Status post left hemicolectomy with suture line in the lower pelvis (series 6/image 181).   Lymphatic: No suspicious abdominopelvic lymphadenopathy.   Reproductive: Status post prostatectomy.   Other: No abdominopelvic ascites.   No hemoperitoneum or free air.   Small fat containing left inguinal hernia. Moderate right inguinal hernia containing fat and nondilated loops of small bowel (series 6/image 214), without fluid or inflammatory changes.   Musculoskeletal: No fracture is seen. Degenerative changes of the lumbar spine.   Review of the MIP images confirms the above findings.   IMPRESSION: No evidence of thoracic or abdominal aortic aneurysm or dissection.   Small pericardial effusion, mildly progressive.   10 mm hypervascular lesion in the pancreatic uncinate process, possibly reflecting a small pancreatic neuroendocrine tumor, less likely a vascular lesion. Consider follow-up MRI abdomen with/without contrast in 4-6 weeks, as clinically warranted.   Additional ancillary findings as above.   Aortic Atherosclerosis (ICD10-I70.0) and Emphysema (ICD10-J43.9).   Electronically Signed   By: Zadie Herter M.D.   On: 05/19/2023 16:07   Impression and Plan: ATAA: Mr. Steimle is a 78 year old presents to the clinic today and his last 2 chest CT scans have read no aneurysm is present but by my measurements as well as Dr. Audree Bless measurements the ascending aorta measures about 4.2cm. This seems to have been overread in the past as 4.5cm. Echocardiogram shows a tricuspid valve with evidence of calcification but no evidence of regurgitation. We discussed the natural history and and risk factors for growth of ascending aortic aneurysms.  We covered the importance of tight blood pressure control, refraining from lifting heavy objects, and avoiding fluoroquinolones. The patient is aware of signs and symptoms of aortic  dissection and when to present to the emergency department. He does not yet meet surgical threshold of 5.5cm. We will continue surveillance, plan to have a telephone visit in 1 year with noncontrast chest CT due to CKD.    Emphysema: Former smoker, instructed he follow up with his PCP.  Pancreatic hypervascular lesion: Present on CTA in December 2024 but no longer mentioned on CT in March.  Risk Modification:  Statin:  Atorvastatin   Smoking cessation instruction/counseling given: quit 25 years ago  Patient was counseled on importance of Blood Pressure Control.  Despite Medical intervention if the patient notices persistently elevated blood pressure readings.  They are instructed to contact their Primary Care Physician  Please avoid use of Fluoroquinolones as this can potentially increase your risk of Aortic Rupture and/or Dissection  Patient educated on signs and symptoms of Aortic Dissection, handout also provided in AVS  Randa Burton, PA-C 09/17/23

## 2023-09-21 ENCOUNTER — Ambulatory Visit

## 2023-09-21 ENCOUNTER — Other Ambulatory Visit

## 2023-09-27 ENCOUNTER — Telehealth: Payer: Self-pay

## 2023-09-27 ENCOUNTER — Telehealth: Payer: Self-pay | Admitting: Family Medicine

## 2023-09-27 NOTE — Telephone Encounter (Signed)
 Copied from CRM [id number]. Topic: Appointments - Appointment Scheduling >> Sep 27, 2023  9:41 AM Hobson Luna F wrote: Patient called in to schedule an appointment for a lab. Call dropped before we schedule.

## 2023-09-28 ENCOUNTER — Other Ambulatory Visit

## 2023-09-28 ENCOUNTER — Ambulatory Visit

## 2023-09-28 ENCOUNTER — Ambulatory Visit: Payer: Medicare Other

## 2023-09-29 ENCOUNTER — Ambulatory Visit: Attending: Thoracic Surgery (Cardiothoracic Vascular Surgery) | Admitting: Physician Assistant

## 2023-09-29 DIAGNOSIS — I7121 Aneurysm of the ascending aorta, without rupture: Secondary | ICD-10-CM

## 2023-10-05 NOTE — Progress Notes (Deleted)
 Cardiology Office Note:    Date:  10/05/2023   ID:  Mandrill, Gotz [birth year], MRN [id number]  PCP:  Eliodoro Guerin, DO  Cardiologist:  Wendie Hamburg, MD  Electrophysiologist:  Efraim Grange, MD   Referring MD: Eliodoro Guerin, DO   No chief complaint on file.   History of Present Illness:    Billy Rasmussen is a 78 y.o. male with a hx of chronic combined systolic and diastolic heart failure, bladder cancer s/p left nephrectomy, cystoprostatectomy , fibromyalgia, hypertension, OSA not on CPAP, psoriasis who presents for follow-up.  He was initially seen as an ED follow-up on 10/06/2022.  He was seen in the ED on 09/27/2022 for shortness of breath.  Chest x-ray shows interstitial edema and mildly elevated BNP.  CTPA showed no PE but findings suggestive of pulmonary edema, as well as 4.5 cm ascending aortic aneurysm.  He was given Lasix  with improvement and discharged on p.o. Lasix  20 mg daily with referral to cardiology.  Echocardiogram 04/2019 showed EF 55-60%, ascending aortic aneurysm measuring 45 mm, normal RV function, no significant valvular disease.  At initial clinic visit on 10/06/2022, he reported fever and was hypotensive.  He was sent to the ED.  He was admitted from 5/7 through 10/13/2022.  He was treated for UTI.  Echo 10/17/2022 showed EF less than 20%, mildly reduced RV systolic dysfunction, RVSP 44, severe left atrial enlargement, mild MR, dilated ascending aorta measuring 45 mm.  RHC/LHC on 10/09/2022 showed nonobstructive CAD (30% RCA stenosis, RA 6 RV 50/3, PA 55/26/37 PCWP 27 CI 3.85.  Had AKI during admission, diuresis was held and he was discharged on as needed Lasix .  Zio patch x 10 days 10/2022 showed 1 episode of NSVT lasting 8 beats, 10 episodes of SVT with longest lasting 19 beats.  Echocardiogram 12/2022 showed EF less than 20%, grade 2 diastolic dysfunction, elevated left atrial pressure, mild RV dysfunction, mild to moderate MR, dilated ascending  aorta measuring 44 mm.  He was referred to EP and underwent BiV ICD insertion with Dr. Arlester Ladd on 03/01/2023.  Cardiac MRI 05/08/2023 showed no LGE, unable to quantify LV/RV volumes and EF due to heart defect from patient's device, dilated ascending aorta measuring 44 mm.  Echocardiogram 05/14/2023 showed EF 25 to 30%, grade 1 diastolic dysfunction, mild RV dysfunction.  He was admitted 05/2023 with chest pain.  Suspected pericarditis, he was started on aspirin  taper and colchicine .  Since last clinic visit,  he reports he is doing better.  States that he is not having any more chest pain.  He had to stop colchicine  due to diarrhea.  He reports his shortness of breath has improved, has been able to start exercising.  He has some lightheadedness but no syncope.  He denies any edema.  BP Readings from Last 3 Encounters:  08/31/23 (!) 116/52  08/30/23 109/60  08/26/23 100/68   Wt Readings from Last 3 Encounters:  08/31/23 211 lb 12.8 oz (96.1 kg)  08/30/23 210 lb 6.4 oz (95.4 kg)  08/22/23 210 lb 5.1 oz (95.4 kg)     Past Medical History:  Diagnosis Date   AICD (automatic cardioverter/defibrillator) present    [employer]   Anemia aprox. may-1-24   Arthritis    knees, shoulders, elbows   Bladder cancer [facility])    urologist-  dr Derrick Fling   CHF (congestive heart failure) (HCC)    Chronic kidney disease    left kidney removed   Diverticulosis of  colon    Fibromyalgia    GERD (gastroesophageal reflux disease)    History of diverticulitis of colon    History of gastric ulcer    due to aleve   Hypertension    Lower urinary tract symptoms (LUTS)    Myocardial infarction (HCC) may 1-24   OSA (obstructive sleep apnea)    Wears CPAP nightly   Oxygen  deficiency    PONV (postoperative nausea and vomiting)    Psoriasis    Sepsis (HCC)    january 2021   Sleep apnea    Thyroid  disease    biopsy on 10-08-22   Tinnitus    right ear more, has tranmitter in right ear removable at hs   Ulcer     Wears glasses    Wears partial dentures     Past Surgical History:  Procedure Laterality Date   APPENDECTOMY     BIV ICD INSERTION CRT-D N/A 03/01/2023   Procedure: BIV ICD INSERTION CRT-D;  Surgeon: Efraim Grange, MD;  [facility];  Service: Cardiovascular;  Laterality: N/A;   COLECTOMY W/ COLOSTOMY  1996   W/   APPENDECTOMY   COLONOSCOPY N/A 05/11/2018   Procedure: COLONOSCOPY;  Surgeon: Suzette Espy, MD;  [facility];  Service: Endoscopy;  Laterality: N/A;  9:30   COLOSTOMY TAKEDOWN  1996   CYSTOSCOPY WITH BIOPSY N/A 12/05/2013   Procedure: CYSTO BLADDER BIOPSY AND FULGERATION;  Surgeon: Christina Coyer, MD;  Location: [facility];  Service: Urology;  Laterality: N/A;   CYSTOSCOPY WITH BIOPSY Bilateral 11/13/2014   Procedure: CYSTOSCOPY WITH  BLADDER BIOPSY FULGERATION AND BILATERAL RETROGRADE PYELOGRAMS;  Surgeon: Christina Coyer, MD;  Location: [facility];  Service: Urology;  Laterality: Bilateral;   CYSTOSCOPY WITH FULGERATION N/A 01/18/2018   Procedure: CYSTOSCOPY WITH FULGERATION/ BLADDER BIOPSY;  Surgeon: Christina Coyer, MD;  Location: [facility];  Service: Urology;  Laterality: N/A;   CYSTOSCOPY WITH INJECTION N/A 11/09/2018   Procedure: CYSTOSCOPY WITH INJECTION OF INDOCYANINE GREEN  DYE;  Surgeon: Osborn Blaze, MD;  Location: [facility];  Service: Urology;  Laterality: N/A;   CYSTOSCOPY WITH INSERTION OF UROLIFT N/A 01/18/2018   Procedure: CYSTOSCOPY WITH INSERTION OF UROLIFT;  Surgeon: Christina Coyer, MD;  Location: [facility];  Service: Urology;  Laterality: N/A;   CYSTOSCOPY/URETEROSCOPY/HOLMIUM LASER Left 02/17/2019   Procedure: URETEROSCOPY WITH BALLOON DILATION  AND NEPHROSTOGRAM;  Surgeon: Osborn Blaze, MD;  Location: [facility];  Service: Urology;  Laterality: Left;  1 HR   EXCISION RIGHT UPPER ARM LIPOMA  2005   FRACTURE SURGERY      HEMORROIDECTOMY     INGUINAL HERNIA REPAIR Left 1984   IR NEPHROSTOMY PLACEMENT LEFT  01/05/2019   JOINT REPLACEMENT     both  knees, and both shoulders joints replaced.   KNEE ARTHROSCOPY Left X3  LAST ONE  2002   ORIF LEFT HUMEROUS FX  1976   POLYPECTOMY  05/11/2018   Procedure: POLYPECTOMY;  Surgeon: Suzette Espy, MD;  [facility];  Service: Endoscopy;;   PROSTATE SURGERY     RIGHT HEART CATH AND CORONARY ANGIOGRAPHY N/A 10/09/2022   Procedure: RIGHT HEART CATH AND CORONARY ANGIOGRAPHY;  Surgeon: Kyra Phy, MD;  [facility];  Service: Cardiovascular;  Laterality: N/A;   ROBOT ASSISTED LAPAROSCOPIC NEPHRECTOMY Left 07/14/2019   Procedure: XI ROBOTIC ASSISTED LAPAROSCOPIC RETROPERITONEAL NEPHRECTOMY;  Surgeon: Osborn Blaze, MD;  Location: [facility];  Service: Urology;  Laterality: Left;  3 HRS   SMALL INTESTINE SURGERY     THYROIDECTOMY Bilateral 04/14/2023   Procedure: TOTAL THYROIDECTOMY;  Surgeon: Janita Mellow, MD;  Location[facility];  Service: ENT;  Laterality: Bilateral;   TONSILLECTOMY  AS CHILD   TOTAL KNEE ARTHROPLASTY Left 2006   REVISION 2007  (AFTER I & D WITH ANTIBIOTIC SPACER PROCEDURE FOR STEPH INFECTION)   TOTAL KNEE ARTHROPLASTY Right 03/30/2016   Procedure: RIGHT TOTAL KNEE ARTHROPLASTY;  Surgeon: Claiborne Crew, MD;  Location: [facility];  Service: Orthopedics;  Laterality: Right;   TOTAL SHOULDER ARTHROPLASTY Right 04/06/2013   Procedure: RIGHT TOTAL SHOULDER ARTHROPLASTY;  Surgeon: Glo Larch, MD;  Location[facility];  Service: Orthopedics;  Laterality: Right;   TOTAL SHOULDER ARTHROPLASTY Left 07/20/2013   Procedure: LEFT TOTAL SHOULDER ARTHROPLASTY;  Surgeon: Glo Larch, MD;  Location: [facility];  Service: Orthopedics;  Laterality: Left;   TRANSURETHRAL RESECTION OF BLADDER TUMOR N/A 11/12/2015   Procedure: TRANSURETHRAL RESECTION OF BLADDER TUMOR (TURBT);  Surgeon: Christina Coyer, MD;  Location: [facility];  Service:  Urology;  Laterality: N/A;   TRANSURETHRAL RESECTION OF BLADDER TUMOR WITH GYRUS (TURBT-GYRUS) N/A 12/05/2013   Procedure: TRANSURETHRAL RESECTION OF BLADDER TUMOR WITH GYRUS (TURBT-GYRUS);  Surgeon: Christina Coyer, MD;  Location: [facility];  Service: Urology;  Laterality: N/A;    Current Medications: No outpatient medications have been marked as taking for the 10/08/23 encounter (Appointment) with Wendie Hamburg, MD.     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   Retired, Visual merchandiser   Social Drivers of Corporate investment banker Strain: Low Risk  (08/16/2023)   Overall Financial Resource Strain (CARDIA)    Difficulty of Paying Living Expenses: Not very hard  Recent Concern: Financial Resource Strain - Medium Risk (07/05/2023)   Overall Financial Resource Strain (CARDIA)    Difficulty of Paying Living Expenses: Somewhat hard  Food Insecurity: No Food Insecurity (08/27/2023)   Hunger Vital Sign    Worried About Running Out of Food in the Last Year: Never true    Ran Out of Food in the Last Year:  Never true  Transportation Needs: No Transportation Needs (08/27/2023)   PRAPARE - Administrator, Civil Service (Medical): No    Lack of Transportation (Non-Medical): No  Physical Activity: Insufficiently Active (08/16/2023)   Exercise Vital Sign    Days  of Exercise per Week: 3 days    Minutes of Exercise per Session: 10 min  Stress: No Stress Concern Present (08/16/2023)   [employer] of Occupational Health - Occupational Stress Questionnaire    Feeling of Stress : Not at all  Social Connections: Patient Declined (08/22/2023)   Social Connection and Isolation Panel [NHANES]    Frequency of Communication with Friends and Family: Patient declined    Frequency of Social Gatherings with Friends and Family: Patient declined    Attends Religious Services: Patient declined    Database administrator or Organizations: Patient declined    Attends Engineer, structural: Patient declined    Marital Status: Patient declined     Family History: The patient's family history includes Alzheimer's disease in his mother; Arthritis in his father; Congestive Heart Failure in his brother; Diabetes in his daughter; Hearing loss in his father; Heart attack in his father; Heart disease in his brother and father; Hypertension in his father; Irregular heart beat in his brother. There is no history of Sleep apnea.  ROS:   Please see the history of present illness.     All other systems reviewed and are negative.  EKGs/Labs/Other Studies Reviewed:    The following studies were reviewed today:   EKG:   03/18/23: BiV paced, rate 111 10/06/22: Sinus rhythm with first-degree AV block, rate 88, nonspecific intraventricular conduction delay, right axis deviation  Recent Labs: 05/19/2023: B Natriuretic Peptide 64.6 08/23/2023: Magnesium  2.4 08/31/2023: ALT 68; BUN 32; Creatinine 1.44; Hemoglobin 11.5; Platelet Count 156; Potassium 4.5; Sodium 135 09/13/2023: TSH 4.190  Recent Lipid Panel     Component Value Date/Time   CHOL 93 (L) 03/18/2023 1121   TRIG 253 (H) 03/18/2023 1121   HDL 21 (L) 03/18/2023 1121   CHOLHDL 4.4 03/18/2023 1121   LDLCALC 33 03/18/2023 1121   LDLDIRECT 96 02/25/2016 1352    Physical Exam:    VS:  There were no vitals taken for this visit.    Wt Readings from Last 3 Encounters:  08/31/23 211 lb 12.8 oz (96.1 kg)  08/30/23 210 lb 6.4 oz (95.4 kg)  08/22/23 210 lb 5.1 oz (95.4 kg)     GEN:  Well nourished, well developed in no acute distress HEENT: Normal NECK: No JVD; No carotid bruits CARDIAC: RRR, no murmurs, rubs, gallops RESPIRATORY:  Clear to auscultation without rales, wheezing or rhonchi  ABDOMEN: Soft, non-tender, non-distended MUSCULOSKELETAL:  No edema; No deformity  SKIN: Warm and dry NEUROLOGIC:  Alert and oriented x 3 PSYCHIATRIC:  Normal affect   ASSESSMENT:    No diagnosis found.   PLAN:     Chronic combined heart failure: Echo 10/17/2022 showed EF less than 20%, mildly reduced RV systolic dysfunction, RVSP 44, severe left atrial enlargement, mild MR, dilated ascending aorta measuring 45 mm.  RHC/LHC on 10/09/2022 showed nonobstructive CAD (30% RCA stenosis, RA 6 RV 50/3, PA 55/26/37 PCWP 27 CI 3.85.  Echocardiogram 12/2022 showed EF remains less than 20%.  He was referred to EP and underwent BiV ICD insertion with Dr. Arlester Ladd on 03/01/2023.  Cardiac MRI 05/08/2023 showed no LGE, unable to quantify LV/RV volumes and EF due to heart defect from patient's device, dilated ascending aorta measuring 44 mm.   Echocardiogram 05/14/2023 showed EF 25 to 30%, grade 1 diastolic dysfunction, mild RV dysfunction. -Continue Lasix  20 mg daily as needed.  Would monitor daily weights and take Lasix  if gains more than 3 pounds in 1 day or 5  pounds in 1 week -Continue Toprol -XL 25 mg daily -Continue losartan  25 mg daily.   -Continue spironolactone  12.5 mg daily -Hold off on SGLT2 inhibitor given frequent UTIs -Soft BP, unable to titrate  GDMT -Check CMET/magnesium   Pericarditis: He was admitted 05/2023 with chest pain.  Suspected pericarditis, he was started on aspirin  taper and colchicine .  He had to stop colchicine  due to diarrhea.  Reports no recent chest pain  CAD: LHC on 10/09/2022 showed nonobstructive CAD with 30% RCA stenosis. -LDL 94 on 07/07/2022, goal less than 70.  Started atorvastatin  20 mg daily.  LDL 33 on 03/18/2023.  He is reporting muscle cramps, decreased atorvastatin  to 10 mg daily.   Palpitations: Zio patch x 10 days 10/2022 showed 1 episode of NSVT lasting 8 beats, 10 episodes of SVT with longest lasting 19 beats. -Continue Toprol -XL 25 mg daily  Aortic aneurysm: Ascending aortic aneurysm measuring 45 mm on CTPA 08/2022.  Will monitor  CKD 3A: Has history of left nephrectomy due to bladder cancer.  Baseline creatinine appears 1.3-1.5.  Creatinine was up to 1.8 during admission 09/2022.  Most recent creatinine 1.44 on 08/31/2023.  Follows with nephrology.  Check CMET  Hypertension: On Toprol -XL, losartan , and spironolactone  as above  OSA: Reports compliance with CPAP  Thyroid  cancer: Status post thyroidectomy 04/2023  RTC in 3 months***  Medication Adjustments/Labs and Tests Ordered: Current medicines are reviewed at length with the patient today.  Concerns regarding medicines are outlined above.  No orders of the defined types were placed in this encounter.  No orders of the defined types were placed in this encounter.   There are no Patient Instructions on file for this visit.   Signed, Wendie Hamburg, MD  10/05/2023 10:08 PM    [facility]

## 2023-10-08 ENCOUNTER — Ambulatory Visit: Payer: Medicare Other | Admitting: Cardiology

## 2023-10-11 ENCOUNTER — Ambulatory Visit (INDEPENDENT_AMBULATORY_CARE_PROVIDER_SITE_OTHER): Admitting: Nurse Practitioner

## 2023-10-11 ENCOUNTER — Encounter: Payer: Self-pay | Admitting: Nurse Practitioner

## 2023-10-11 ENCOUNTER — Ambulatory Visit (INDEPENDENT_AMBULATORY_CARE_PROVIDER_SITE_OTHER)

## 2023-10-11 VITALS — BP 113/57 | HR 99 | Temp 97.0°F | Ht 66.0 in | Wt 220.4 lb

## 2023-10-11 DIAGNOSIS — M25531 Pain in right wrist: Secondary | ICD-10-CM | POA: Diagnosis not present

## 2023-10-11 DIAGNOSIS — R058 Other specified cough: Secondary | ICD-10-CM | POA: Diagnosis not present

## 2023-10-11 DIAGNOSIS — M1811 Unilateral primary osteoarthritis of first carpometacarpal joint, right hand: Secondary | ICD-10-CM | POA: Diagnosis not present

## 2023-10-11 DIAGNOSIS — S62101A Fracture of unspecified carpal bone, right wrist, initial encounter for closed fracture: Secondary | ICD-10-CM | POA: Diagnosis not present

## 2023-10-11 MED ORDER — AZITHROMYCIN 250 MG PO TABS: ORAL_TABLET | ORAL | 0 refills | Status: AC

## 2023-10-11 MED ORDER — BENZONATATE 200 MG PO CAPS: 200.0000 mg | ORAL_CAPSULE | Freq: Two times a day (BID) | ORAL | 0 refills | Status: DC | PRN

## 2023-10-11 MED ORDER — METHYLPREDNISOLONE 4 MG PO TBPK: ORAL_TABLET | ORAL | 0 refills | Status: DC

## 2023-10-11 NOTE — Progress Notes (Signed)
 Acute Office Visit  Subjective:     Patient ID: Billy Rasmussen, male    DOB: 1946-04-04, 78 y.o.   MRN: [id number]  Chief Complaint  Patient presents with   Cough    Coughing up phlegm started Friday    Sore Throat   Wrist Pain    Right wrist pain started Saturday may have hurt it when moving a table    HPI Billy Rasmussen is a 78 year old male presenting on 10/11/2023 with a 5-day history of nasal congestion and a productive cough. He describes the cough as producing yellow sputum but denies chest pain, fever, dizziness, or shortness of breath. He has no personal history of asthma and denies tobacco use. He reports mild discomfort but no worsening of symptoms.  Musculoskeletal Complaint (Wrist Pain): Billy Rasmussen also reports right wrist pain that began after lifting and moving a heavy table at a yard sale on Friday. He describes the pain as throbbing and rates it 7/10 in intensity. He has taken over-the-counter Tylenol  with minimal relief. He is currently wearing a spica brace for support. There is no report of visible swelling or bruising, but the pain persists with movement and weight-bearing.   Active Ambulatory Problems    Diagnosis Date Noted   Shoulder arthritis 04/07/2013   S/P shoulder replacement 07/20/2013   Obesity (BMI 30-39.9) 01/07/2016   Essential hypertension 02/25/2016   S/P right TKA 03/30/2016   S/P knee replacement 03/30/2016   Obese 03/31/2016   Lipoma 09/30/2016   Pure hypercholesterolemia 10/02/2016   Malignant neoplasm of urinary bladder (HCC) 10/02/2016   BCG cystitis 02/02/2018   Bladder cancer (HCC) 11/09/2018   AKI (acute kidney injury) (HCC) 12/22/2018   Gastroesophageal reflux disease    Abnormal renal function 01/20/2019   OSA (obstructive sleep apnea) 05/12/2019   UTI (urinary tract infection) 06/25/2019   Nonfunctioning kidney 07/14/2019   Renal mass 07/15/2019   S/P radical cystoprostatectomy 12/28/2019   S/p nephrectomy 12/28/2019    Lumbar foraminal stenosis 03/21/2020   Aneurysm of ascending aorta without rupture (HCC) 09/02/2022   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   Multiple thyroid  nodules 09/14/2022   Hypotension 10/06/2022   Hyponatremia 10/07/2022   Thrombocytopenia (HCC) 10/07/2022   Elevated troponin 10/07/2022   Acquired hypothyroidism 10/07/2022   Acute combined systolic and diastolic heart failure (HCC) 10/08/2022   Acute on chronic systolic CHF (congestive heart failure) (HCC) 10/08/2022   HFrEF (heart failure with reduced ejection fraction) (HCC) 10/10/2022   Dysuria 11/20/2017   S/P total thyroidectomy 04/14/2023   Chest pain 05/13/2023   Hospital discharge follow-up 05/17/2023   Pericarditis 05/17/2023   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   Bleeding from the nose 07/06/2023   Ageusia 07/06/2023   Respiratory tract congestion with cough 08/18/2023   Leukopenia 08/22/2023   CKD stage 3a, GFR 45-59 ml/min (HCC) 08/22/2023   Chronic combined systolic and diastolic CHF (congestive heart failure) (HCC) 08/22/2023   Mixed hyperlipidemia 08/22/2023   Acute pain of right wrist 10/11/2023   Closed fracture of right wrist 10/11/2023   Resolved Ambulatory Problems    Diagnosis Date Noted   CELLULITIS, KNEE, LEFT 08/26/2006   Healthcare maintenance 02/25/2016   Acute cystitis with hematuria 10/02/2016   Sepsis (HCC) 12/22/2018   Urinary tract infection without hematuria    Acute pyelonephritis    Enterococcus UTI    Sepsis due to urinary tract infection (HCC) 02/17/2019   Fever 04/09/2019   SIRS (systemic inflammatory response syndrome) (HCC) 04/09/2019   Bacteremia due  to Klebsiella pneumoniae 12/28/2019   ESBL (extended spectrum beta-lactamase) producing bacteria infection 03/21/2020   Past Medical History:  Diagnosis Date   AICD (automatic cardioverter/defibrillator) present    Anemia aprox. may-1-24   Arthritis    CHF (congestive heart failure) (HCC)    Chronic kidney disease    Diverticulosis of  colon    Fibromyalgia    GERD (gastroesophageal reflux disease)    History of diverticulitis of colon    History of gastric ulcer    Hypertension    Lower urinary tract symptoms (LUTS)    Myocardial infarction (HCC) may 1-24   Oxygen  deficiency    PONV (postoperative nausea and vomiting)    Psoriasis    Sleep apnea    Thyroid  disease    Tinnitus    Ulcer    Wears glasses    Wears partial dentures     Review of Systems  HENT:  Positive for congestion.   Respiratory:  Positive for cough and sputum production.        Yellow  Gastrointestinal:  Negative for constipation, diarrhea, nausea and vomiting.  Musculoskeletal:  Positive for joint pain.       Right wrist  Neurological:  Negative for dizziness and headaches.   Negative unless indicated in HPI    Objective:    BP (!) 113/57   Pulse 99   Temp (!) 97 F (36.1 C) (Temporal)   Ht 5\' 6"  (1.676 m)   Wt 220 lb 6.4 oz (100 kg)   SpO2 99%   BMI 35.57 kg/m  BP Readings from Last 3 Encounters:  10/11/23 (!) 113/57  08/31/23 (!) 116/52  08/30/23 109/60   Wt Readings from Last 3 Encounters:  10/11/23 220 lb 6.4 oz (100 kg)  08/31/23 211 lb 12.8 oz (96.1 kg)  08/30/23 210 lb 6.4 oz (95.4 kg)      Physical Exam Vitals and nursing note reviewed.  Constitutional:      General: He is not in acute distress. HENT:     Head: Normocephalic and atraumatic.     Right Ear: Tympanic membrane and ear canal normal. No drainage or swelling.     Left Ear: Tympanic membrane and ear canal normal. No drainage or swelling.     Nose: Congestion present.     Mouth/Throat:     Mouth: Mucous membranes are moist.  Eyes:     General: No scleral icterus.    Extraocular Movements: Extraocular movements intact.     Conjunctiva/sclera: Conjunctivae normal.     Pupils: Pupils are equal, round, and reactive to light.  Cardiovascular:     Heart sounds: Normal heart sounds.  Pulmonary:     Effort: Pulmonary effort is normal.     Breath  sounds: Normal breath sounds.  Musculoskeletal:     Right wrist: Swelling, tenderness and bony tenderness present. Decreased range of motion.  Skin:    General: Skin is warm and dry.     Findings: No rash.  Neurological:     Mental Status: He is alert and oriented to person, place, and time.  Psychiatric:        Mood and Affect: Mood normal.        Behavior: Behavior normal.        Thought Content: Thought content normal.        Judgment: Judgment normal.    X-ray: Likely acute mildly displaced avulsion fracture of the triquetral bone (small bone in the wrist), with associated soft tissue swelling,  especially on the dorsal (back) side of the wrist.  Severe osteoarthritis at the thumb carpometacarpal [facility]) joint, with joint space narrowing, bone spurs, sclerosis, and cyst formation.  Moderate osteoarthritis at the triscaphe joint (area where three wrist bones meet), with similar degenerative changes.  Evidence of a chronic ossicle (small bone fragment) near the 5th carpometacarpal joint.  Mild to moderate chondrocalcinosis (calcium  deposits) in the triangular fibrocartilage complex area.  Chronic cystic changes in the ulnar styloid (small bony projection at the wrist end of the ulna). No results found for any visits on 10/11/23.      Assessment & Plan:  Closed fracture of right wrist, initial encounter -     Ambulatory referral to Orthopedics  Acute pain of right wrist -     DG Wrist Complete Right -     methylPREDNISolone ; Follow instructions on the box  Dispense: 21 tablet; Refill: 0 -     Ambulatory referral to Orthopedics  Respiratory tract congestion with cough -     Azithromycin ; Take 2 tablets on day 1, then 1 tablet daily on days 2 through 5  Dispense: 6 tablet; Refill: 0 -     Benzonatate ; Take 1 capsule (200 mg total) by mouth 2 (two) times daily as needed for cough.  Dispense: 20 capsule; Refill: 0   Billy Rasmussen is a 78 yrs old caucasian male seen today for wrist  pain and sinusitis, no acute distress Sinusitis: z-pack # 6 dispense, tessalon  pearls 200 mg BID Wrist pain, fracture: x-ray; medrol  dose pack #21- keep wrist immobilized with brace Referral to Ortho   The above assessment and management plan was discussed with the patient. The patient verbalized understanding of and has agreed to the management plan. Patient is aware to call the clinic if they develop any new symptoms or if symptoms persist or worsen. Patient is aware when to return to the clinic for a follow-up visit. Patient educated on when it is appropriate to go to the emergency department.  Return if symptoms worsen or fail to improve.  Billy Carchi [place], DNP [facility] [address] [place], [place] [address] [phone]  Note: This document was prepared by [employer] voice dictation technology and any errors that results from this process are unintentional.

## 2023-10-11 NOTE — Progress Notes (Signed)
 Remote ICD transmission.

## 2023-10-11 NOTE — Addendum Note (Signed)
 Addended by: Lott Rouleau A on: 10/11/2023 02:01 PM   Modules accepted: Orders

## 2023-10-21 ENCOUNTER — Inpatient Hospital Stay (HOSPITAL_COMMUNITY)
Admission: EM | Admit: 2023-10-21 | Discharge: 2023-10-25 | DRG: 698 | Disposition: A | Discharge status: Home / Self Care | Attending: Internal Medicine | Admitting: Internal Medicine

## 2023-10-21 ENCOUNTER — Encounter (HOSPITAL_COMMUNITY): Payer: Self-pay | Admitting: Internal Medicine

## 2023-10-21 ENCOUNTER — Other Ambulatory Visit: Payer: Self-pay

## 2023-10-21 ENCOUNTER — Emergency Department (HOSPITAL_COMMUNITY)

## 2023-10-21 DIAGNOSIS — K219 Gastro-esophageal reflux disease without esophagitis: Secondary | ICD-10-CM | POA: Diagnosis not present

## 2023-10-21 DIAGNOSIS — Z8711 Personal history of peptic ulcer disease: Secondary | ICD-10-CM

## 2023-10-21 DIAGNOSIS — I771 Stricture of artery: Secondary | ICD-10-CM | POA: Diagnosis not present

## 2023-10-21 DIAGNOSIS — T83593A Infection and inflammatory reaction due to other urinary stents, initial encounter: Principal | ICD-10-CM | POA: Diagnosis present

## 2023-10-21 DIAGNOSIS — D6959 Other secondary thrombocytopenia: Secondary | ICD-10-CM | POA: Diagnosis not present

## 2023-10-21 DIAGNOSIS — I252 Old myocardial infarction: Secondary | ICD-10-CM

## 2023-10-21 DIAGNOSIS — Z8619 Personal history of other infectious and parasitic diseases: Secondary | ICD-10-CM

## 2023-10-21 DIAGNOSIS — Y738 Miscellaneous gastroenterology and urology devices associated with adverse incidents, not elsewhere classified: Secondary | ICD-10-CM | POA: Diagnosis present

## 2023-10-21 DIAGNOSIS — E890 Postprocedural hypothyroidism: Secondary | ICD-10-CM | POA: Diagnosis not present

## 2023-10-21 DIAGNOSIS — R319 Hematuria, unspecified: Secondary | ICD-10-CM | POA: Diagnosis not present

## 2023-10-21 DIAGNOSIS — R531 Weakness: Secondary | ICD-10-CM | POA: Diagnosis not present

## 2023-10-21 DIAGNOSIS — R6520 Severe sepsis without septic shock: Secondary | ICD-10-CM

## 2023-10-21 DIAGNOSIS — N390 Urinary tract infection, site not specified: Secondary | ICD-10-CM

## 2023-10-21 DIAGNOSIS — E871 Hypo-osmolality and hyponatremia: Secondary | ICD-10-CM

## 2023-10-21 DIAGNOSIS — A4152 Sepsis due to Pseudomonas: Secondary | ICD-10-CM | POA: Diagnosis not present

## 2023-10-21 DIAGNOSIS — K4020 Bilateral inguinal hernia, without obstruction or gangrene, not specified as recurrent: Secondary | ICD-10-CM | POA: Diagnosis not present

## 2023-10-21 DIAGNOSIS — Z936 Other artificial openings of urinary tract status: Secondary | ICD-10-CM | POA: Diagnosis not present

## 2023-10-21 DIAGNOSIS — A419 Sepsis, unspecified organism: Secondary | ICD-10-CM | POA: Diagnosis not present

## 2023-10-21 DIAGNOSIS — Z820 Family history of epilepsy and other diseases of the nervous system: Secondary | ICD-10-CM

## 2023-10-21 DIAGNOSIS — I2510 Atherosclerotic heart disease of native coronary artery without angina pectoris: Secondary | ICD-10-CM | POA: Diagnosis not present

## 2023-10-21 DIAGNOSIS — I517 Cardiomegaly: Secondary | ICD-10-CM | POA: Diagnosis not present

## 2023-10-21 DIAGNOSIS — Z96611 Presence of right artificial shoulder joint: Secondary | ICD-10-CM | POA: Diagnosis present

## 2023-10-21 DIAGNOSIS — I499 Cardiac arrhythmia, unspecified: Secondary | ICD-10-CM | POA: Diagnosis not present

## 2023-10-21 DIAGNOSIS — I700 Atherosclerosis of aorta: Secondary | ICD-10-CM | POA: Diagnosis not present

## 2023-10-21 DIAGNOSIS — Z79899 Other long term (current) drug therapy: Secondary | ICD-10-CM

## 2023-10-21 DIAGNOSIS — N1831 Chronic kidney disease, stage 3a: Secondary | ICD-10-CM | POA: Diagnosis present

## 2023-10-21 DIAGNOSIS — Z9049 Acquired absence of other specified parts of digestive tract: Secondary | ICD-10-CM

## 2023-10-21 DIAGNOSIS — M109 Gout, unspecified: Secondary | ICD-10-CM | POA: Diagnosis present

## 2023-10-21 DIAGNOSIS — I5042 Chronic combined systolic (congestive) and diastolic (congestive) heart failure: Secondary | ICD-10-CM | POA: Diagnosis present

## 2023-10-21 DIAGNOSIS — Z87891 Personal history of nicotine dependence: Secondary | ICD-10-CM

## 2023-10-21 DIAGNOSIS — R9082 White matter disease, unspecified: Secondary | ICD-10-CM | POA: Diagnosis not present

## 2023-10-21 DIAGNOSIS — G9341 Metabolic encephalopathy: Secondary | ICD-10-CM | POA: Diagnosis not present

## 2023-10-21 DIAGNOSIS — Z96612 Presence of left artificial shoulder joint: Secondary | ICD-10-CM | POA: Diagnosis present

## 2023-10-21 DIAGNOSIS — M545 Low back pain, unspecified: Secondary | ICD-10-CM | POA: Diagnosis present

## 2023-10-21 DIAGNOSIS — Z885 Allergy status to narcotic agent status: Secondary | ICD-10-CM

## 2023-10-21 DIAGNOSIS — R102 Pelvic and perineal pain: Secondary | ICD-10-CM | POA: Diagnosis present

## 2023-10-21 DIAGNOSIS — E785 Hyperlipidemia, unspecified: Secondary | ICD-10-CM | POA: Diagnosis not present

## 2023-10-21 DIAGNOSIS — I130 Hypertensive heart and chronic kidney disease with heart failure and stage 1 through stage 4 chronic kidney disease, or unspecified chronic kidney disease: Secondary | ICD-10-CM | POA: Diagnosis present

## 2023-10-21 DIAGNOSIS — Z906 Acquired absence of other parts of urinary tract: Secondary | ICD-10-CM

## 2023-10-21 DIAGNOSIS — N179 Acute kidney failure, unspecified: Secondary | ICD-10-CM | POA: Diagnosis not present

## 2023-10-21 DIAGNOSIS — Z6833 Body mass index (BMI) 33.0-33.9, adult: Secondary | ICD-10-CM

## 2023-10-21 DIAGNOSIS — Y92410 Unspecified street and highway as the place of occurrence of the external cause: Secondary | ICD-10-CM

## 2023-10-21 DIAGNOSIS — Z87440 Personal history of urinary (tract) infections: Secondary | ICD-10-CM

## 2023-10-21 DIAGNOSIS — L409 Psoriasis, unspecified: Secondary | ICD-10-CM | POA: Diagnosis not present

## 2023-10-21 DIAGNOSIS — G4733 Obstructive sleep apnea (adult) (pediatric): Secondary | ICD-10-CM | POA: Diagnosis not present

## 2023-10-21 DIAGNOSIS — R5383 Other fatigue: Secondary | ICD-10-CM | POA: Diagnosis not present

## 2023-10-21 DIAGNOSIS — R0689 Other abnormalities of breathing: Secondary | ICD-10-CM | POA: Diagnosis not present

## 2023-10-21 DIAGNOSIS — E872 Acidosis, unspecified: Secondary | ICD-10-CM | POA: Diagnosis present

## 2023-10-21 DIAGNOSIS — Z95810 Presence of automatic (implantable) cardiac defibrillator: Secondary | ICD-10-CM

## 2023-10-21 DIAGNOSIS — I10 Essential (primary) hypertension: Secondary | ICD-10-CM | POA: Diagnosis not present

## 2023-10-21 DIAGNOSIS — Z822 Family history of deafness and hearing loss: Secondary | ICD-10-CM

## 2023-10-21 DIAGNOSIS — Z905 Acquired absence of kidney: Secondary | ICD-10-CM

## 2023-10-21 DIAGNOSIS — Z8249 Family history of ischemic heart disease and other diseases of the circulatory system: Secondary | ICD-10-CM

## 2023-10-21 DIAGNOSIS — Z8551 Personal history of malignant neoplasm of bladder: Secondary | ICD-10-CM

## 2023-10-21 DIAGNOSIS — H9311 Tinnitus, right ear: Secondary | ICD-10-CM | POA: Diagnosis not present

## 2023-10-21 DIAGNOSIS — E66811 Obesity, class 1: Secondary | ICD-10-CM | POA: Diagnosis present

## 2023-10-21 DIAGNOSIS — M797 Fibromyalgia: Secondary | ICD-10-CM | POA: Diagnosis not present

## 2023-10-21 DIAGNOSIS — Z85850 Personal history of malignant neoplasm of thyroid: Secondary | ICD-10-CM

## 2023-10-21 DIAGNOSIS — R4182 Altered mental status, unspecified: Secondary | ICD-10-CM

## 2023-10-21 DIAGNOSIS — Z96653 Presence of artificial knee joint, bilateral: Secondary | ICD-10-CM | POA: Diagnosis present

## 2023-10-21 DIAGNOSIS — Z79890 Hormone replacement therapy (postmenopausal): Secondary | ICD-10-CM

## 2023-10-21 DIAGNOSIS — Z8261 Family history of arthritis: Secondary | ICD-10-CM

## 2023-10-21 DIAGNOSIS — Z833 Family history of diabetes mellitus: Secondary | ICD-10-CM

## 2023-10-21 LAB — COMPREHENSIVE METABOLIC PANEL WITH GFR
ALT: 22 U/L (ref 0–44)
AST: 43 U/L — ABNORMAL HIGH (ref 15–41)
Albumin: 4.2 g/dL (ref 3.5–5.0)
Alkaline Phosphatase: 50 U/L (ref 38–126)
Anion gap: 13 (ref 5–15)
BUN: 34 mg/dL — ABNORMAL HIGH (ref 8–23)
CO2: 23 mmol/L (ref 22–32)
Calcium: 9.4 mg/dL (ref 8.9–10.3)
Chloride: 95 mmol/L — ABNORMAL LOW (ref 98–111)
Creatinine, Ser: 1.88 mg/dL — ABNORMAL HIGH (ref 0.61–1.24)
GFR, Estimated: 37 mL/min — ABNORMAL LOW (ref >60)
Glucose, Bld: 115 mg/dL — ABNORMAL HIGH (ref 70–99)
Potassium: 4.2 mmol/L (ref 3.5–5.1)
Sodium: 131 mmol/L — ABNORMAL LOW (ref 135–145)
Total Bilirubin: 1.5 mg/dL — ABNORMAL HIGH (ref 0.0–1.2)
Total Protein: 8.2 g/dL — ABNORMAL HIGH (ref 6.5–8.1)

## 2023-10-21 LAB — CBC
HCT: 41.4 % (ref 39.0–52.0)
Hemoglobin: 13.9 g/dL (ref 13.0–17.0)
MCH: 32.1 pg (ref 26.0–34.0)
MCHC: 33.6 g/dL (ref 30.0–36.0)
MCV: 95.6 fL (ref 80.0–100.0)
Platelets: 119 10*3/uL — ABNORMAL LOW (ref 150–400)
RBC: 4.33 MIL/uL (ref 4.22–5.81)
RDW: 14.6 % (ref 11.5–15.5)
WBC: 29.2 10*3/uL — ABNORMAL HIGH (ref 4.0–10.5)
nRBC: 0 % (ref 0.0–0.2)

## 2023-10-21 LAB — URINALYSIS, W/ REFLEX TO CULTURE (INFECTION SUSPECTED)
Bilirubin Urine: NEGATIVE
Glucose, UA: NEGATIVE mg/dL
Ketones, ur: NEGATIVE mg/dL
Nitrite: POSITIVE — AB
Protein, ur: 100 mg/dL — AB
Specific Gravity, Urine: 1.013 (ref 1.005–1.030)
pH: 6 (ref 5.0–8.0)

## 2023-10-21 LAB — RESP PANEL BY RT-PCR (RSV, FLU A&B, COVID)  RVPGX2
Influenza A by PCR: NEGATIVE
Influenza B by PCR: NEGATIVE
Resp Syncytial Virus by PCR: NEGATIVE
SARS Coronavirus 2 by RT PCR: NEGATIVE

## 2023-10-21 LAB — PROTIME-INR
INR: 1.2 (ref 0.8–1.2)
Prothrombin Time: 15.8 s — ABNORMAL HIGH (ref 11.4–15.2)

## 2023-10-21 LAB — CBG MONITORING, ED: Glucose-Capillary: 121 mg/dL — ABNORMAL HIGH (ref 70–99)

## 2023-10-21 LAB — LACTIC ACID, PLASMA
Lactic Acid, Venous: 3.5 mmol/L (ref 0.5–1.9)
Lactic Acid, Venous: 3.4 mmol/L (ref 0.5–1.9)

## 2023-10-21 MED ORDER — LACTATED RINGERS IV BOLUS (SEPSIS)
1000.0000 mL | Freq: Once | INTRAVENOUS | Status: AC
  Administered 2023-10-21: 1000 mL via INTRAVENOUS

## 2023-10-21 MED ORDER — COLCHICINE 0.6 MG PO TABS
0.6000 mg | ORAL_TABLET | Freq: Every day | ORAL | Status: DC
  Administered 2023-10-22 – 2023-10-25 (×3): 0.6 mg via ORAL
  Filled 2023-10-21 (×4): qty 1

## 2023-10-21 MED ORDER — SODIUM CHLORIDE 0.9 % IV SOLN
1.0000 g | Freq: Once | INTRAVENOUS | Status: AC
  Administered 2023-10-21: 1 g via INTRAVENOUS
  Filled 2023-10-21: qty 10

## 2023-10-21 MED ORDER — LACTATED RINGERS IV SOLN: INTRAVENOUS | Status: AC

## 2023-10-21 MED ORDER — ADULT MULTIVITAMIN W/MINERALS CH
1.0000 | ORAL_TABLET | Freq: Every day | ORAL | Status: DC
  Administered 2023-10-22 – 2023-10-25 (×4): 1 via ORAL
  Filled 2023-10-21 (×4): qty 1

## 2023-10-21 MED ORDER — PHENOL 1.4 % MT LIQD: 1.0000 | OROMUCOSAL | Status: DC | PRN

## 2023-10-21 MED ORDER — OXYCODONE HCL 5 MG PO TABS
5.0000 mg | ORAL_TABLET | Freq: Four times a day (QID) | ORAL | Status: DC | PRN
  Administered 2023-10-22 – 2023-10-24 (×4): 5 mg via ORAL
  Filled 2023-10-21 (×4): qty 1

## 2023-10-21 MED ORDER — ACETAMINOPHEN 325 MG PO TABS
650.0000 mg | ORAL_TABLET | Freq: Four times a day (QID) | ORAL | Status: DC | PRN
  Administered 2023-10-21 – 2023-10-22 (×3): 650 mg via ORAL
  Filled 2023-10-21 (×3): qty 2

## 2023-10-21 MED ORDER — ATORVASTATIN CALCIUM 10 MG PO TABS
10.0000 mg | ORAL_TABLET | Freq: Every day | ORAL | Status: DC
  Administered 2023-10-22 – 2023-10-25 (×4): 10 mg via ORAL
  Filled 2023-10-21 (×4): qty 1

## 2023-10-21 MED ORDER — METOPROLOL SUCCINATE ER 50 MG PO TB24
50.0000 mg | ORAL_TABLET | Freq: Every day | ORAL | Status: DC
  Administered 2023-10-21 – 2023-10-25 (×5): 50 mg via ORAL
  Filled 2023-10-21 (×4): qty 1

## 2023-10-21 MED ORDER — ACETAMINOPHEN 325 MG PO TABS
650.0000 mg | ORAL_TABLET | Freq: Once | ORAL | Status: AC
  Administered 2023-10-21: 650 mg via ORAL
  Filled 2023-10-21: qty 2

## 2023-10-21 MED ORDER — LEVOTHYROXINE SODIUM 112 MCG PO TABS
112.0000 ug | ORAL_TABLET | Freq: Every day | ORAL | Status: DC
  Administered 2023-10-22 – 2023-10-25 (×4): 112 ug via ORAL
  Filled 2023-10-21 (×4): qty 1

## 2023-10-21 MED ORDER — ONDANSETRON HCL 4 MG/2ML IJ SOLN: 4.0000 mg | Freq: Four times a day (QID) | INTRAMUSCULAR | Status: DC | PRN

## 2023-10-21 MED ORDER — HEPARIN SODIUM (PORCINE) 5000 UNIT/ML IJ SOLN
5000.0000 [IU] | Freq: Three times a day (TID) | INTRAMUSCULAR | Status: DC
  Administered 2023-10-21 – 2023-10-25 (×11): 5000 [IU] via SUBCUTANEOUS
  Filled 2023-10-21 (×11): qty 1

## 2023-10-21 MED ORDER — SODIUM CHLORIDE 0.9 % IV SOLN
1.0000 g | Freq: Two times a day (BID) | INTRAVENOUS | Status: DC
  Administered 2023-10-22 – 2023-10-24 (×5): 1 g via INTRAVENOUS
  Filled 2023-10-21 (×5): qty 20

## 2023-10-21 MED ORDER — IOHEXOL 300 MG/ML  SOLN
75.0000 mL | Freq: Once | INTRAMUSCULAR | Status: AC | PRN
  Administered 2023-10-21: 75 mL via INTRAVENOUS

## 2023-10-21 MED ORDER — SODIUM CHLORIDE 0.9 % IV SOLN
1.0000 g | Freq: Once | INTRAVENOUS | Status: AC
  Administered 2023-10-21: 1 g via INTRAVENOUS
  Filled 2023-10-21: qty 20

## 2023-10-21 MED ORDER — ACETAMINOPHEN 650 MG RE SUPP: 650.0000 mg | Freq: Four times a day (QID) | RECTAL | Status: DC | PRN

## 2023-10-21 MED ORDER — ONDANSETRON HCL 4 MG PO TABS: 4.0000 mg | ORAL_TABLET | Freq: Four times a day (QID) | ORAL | Status: DC | PRN

## 2023-10-21 NOTE — Progress Notes (Signed)
eLINK following for sepsis protocol. ?

## 2023-10-21 NOTE — Progress Notes (Signed)
 Have confirmed with bedside RN that blood cultures were drawn before antibiotic administered. The lab scanned later.

## 2023-10-21 NOTE — ED Notes (Signed)
 Lab aware fluid is done and is able to draw lactic now.

## 2023-10-21 NOTE — H&P (Signed)
 History and Physical    Patient: Billy Rasmussen ZOX:[id number] DOB: [birth year] DOA: 10/21/2023 DOS: the patient was seen and examined on 10/21/2023 PCP: Eliodoro Guerin, DO   Patient coming from: Home  Chief Complaint:  Chief Complaint  Patient presents with   Altered Mental Status   HPI: Billy Rasmussen is a 78 y.o. male with medical history significant of combined systolic and diastolic heart failure, chronic kidney disease stage IIIa, thyroid  cancer status post thyroidectomy, history of bladder cancer status post cystectomy with urostomy in place, hyperlipidemia, hypertension and prior hospitalization secondary to ESBL UTI; who presented to the hospital secondary to altered mental status changes.    Symptoms present for the last 24-48 hours and worsening.  Family reporting similar presentation to previous UTI infections.  There has not been any chest pain, nausea, vomiting, sick contacts, focal neurologic deficits or any further complaints.  Workup in the ED demonstrated positive fever, WBCs 29.2, lactic acid 3.5 and elevated respiratory rate meeting criteria for severe sepsis having altered mental status/encephalopathy as organ dysfunction.  Fluid resuscitation initiated; cultures taken, antipyretics provided and IV antibiotic has been started.  Given prior history of ESBL patient has been started on meropenem .  [facility] has been contacted to place patient in the hospital for further evaluation and management.  Review of Systems: As mentioned in the history of present illness. All other systems reviewed and are negative.  Past Medical History:  Diagnosis Date   AICD (automatic cardioverter/defibrillator) present    [employer]   Anemia aprox. may-1-24   Arthritis    knees, shoulders, elbows   Bladder cancer [facility])    urologist-  dr Derrick Fling   CHF (congestive heart failure) (HCC)    Chronic kidney disease    left kidney removed   Diverticulosis of colon    Fibromyalgia     GERD (gastroesophageal reflux disease)    History of diverticulitis of colon    History of gastric ulcer    due to aleve   Hypertension    Lower urinary tract symptoms (LUTS)    Myocardial infarction (HCC) may 1-24   OSA (obstructive sleep apnea)    Wears CPAP nightly   Oxygen  deficiency    PONV (postoperative nausea and vomiting)    Psoriasis    Sepsis (HCC)    january 2021   Sleep apnea    Thyroid  disease    biopsy on 10-08-22   Tinnitus    right ear more, has tranmitter in right ear removable at hs   Ulcer    Wears glasses    Wears partial dentures    Past Surgical History:  Procedure Laterality Date   APPENDECTOMY     BIV ICD INSERTION CRT-D N/A 03/01/2023   Procedure: BIV ICD INSERTION CRT-D;  Surgeon: Efraim Grange, MD;  [facility];  Service: Cardiovascular;  Laterality: N/A;   COLECTOMY W/ COLOSTOMY  1996   W/   APPENDECTOMY   COLONOSCOPY N/A 05/11/2018   Procedure: COLONOSCOPY;  Surgeon: Suzette Espy, MD;  [facility]: [facility];  Service: Endoscopy;  Laterality: N/A;  9:30   COLOSTOMY TAKEDOWN  1996   CYSTOSCOPY WITH BIOPSY N/A 12/05/2013   Procedure: CYSTO BLADDER BIOPSY AND FULGERATION;  Surgeon: Christina Coyer, MD;  Location: [facility];  Service: Urology;  Laterality: N/A;   CYSTOSCOPY WITH BIOPSY Bilateral 11/13/2014   Procedure: CYSTOSCOPY WITH  BLADDER BIOPSY FULGERATION AND BILATERAL RETROGRADE PYELOGRAMS;  Surgeon: Christina Coyer, MD;  Location: [facility];  Service: Urology;  Laterality: Bilateral;   CYSTOSCOPY WITH FULGERATION N/A 01/18/2018   Procedure: CYSTOSCOPY WITH FULGERATION/ BLADDER BIOPSY;  Surgeon: Christina Coyer, MD;  Location: [facility];  Service: Urology;  Laterality: N/A;   CYSTOSCOPY WITH INJECTION N/A 11/09/2018   Procedure: CYSTOSCOPY WITH INJECTION OF INDOCYANINE GREEN  DYE;  Surgeon: Osborn Blaze, MD;  Location: [facility];  Service: Urology;  Laterality: N/A;    CYSTOSCOPY WITH INSERTION OF UROLIFT N/A 01/18/2018   Procedure: CYSTOSCOPY WITH INSERTION OF UROLIFT;  Surgeon: Christina Coyer, MD;  Location: [facility];  Service: Urology;  Laterality: N/A;   CYSTOSCOPY/URETEROSCOPY/HOLMIUM LASER Left 02/17/2019   Procedure: URETEROSCOPY WITH BALLOON DILATION  AND NEPHROSTOGRAM;  Surgeon: Osborn Blaze, MD;  Location: [facility];  Service: Urology;  Laterality: Left;  1 HR   EXCISION RIGHT UPPER ARM LIPOMA  2005   FRACTURE SURGERY     HEMORROIDECTOMY     INGUINAL HERNIA REPAIR Left 1984   IR NEPHROSTOMY PLACEMENT LEFT  01/05/2019   JOINT REPLACEMENT     both  knees, and both shoulders joints replaced.   KNEE ARTHROSCOPY Left X3  LAST ONE  2002   ORIF LEFT HUMEROUS FX  1976   POLYPECTOMY  05/11/2018   Procedure: POLYPECTOMY;  Surgeon: Suzette Espy, MD;  [facility];  Service: Endoscopy;;   PROSTATE SURGERY     RIGHT HEART CATH AND CORONARY ANGIOGRAPHY N/A 10/09/2022   Procedure: RIGHT HEART CATH AND CORONARY ANGIOGRAPHY;  Surgeon: Kyra Phy, MD;  [facility];  Service: Cardiovascular;  Laterality: N/A;   ROBOT ASSISTED LAPAROSCOPIC NEPHRECTOMY Left 07/14/2019   Procedure: XI ROBOTIC ASSISTED LAPAROSCOPIC RETROPERITONEAL NEPHRECTOMY;  Surgeon: Osborn Blaze, MD;  Location: [facility];  Service: Urology;  Laterality: Left;  3 HRS   SMALL INTESTINE SURGERY     THYROIDECTOMY Bilateral 04/14/2023   Procedure: TOTAL THYROIDECTOMY;  Surgeon: Janita Mellow, MD;  Location: [facility];  Service: ENT;  Laterality: Bilateral;   TONSILLECTOMY  AS CHILD   TOTAL KNEE ARTHROPLASTY Left 2006   REVISION 2007  (AFTER I & D WITH ANTIBIOTIC SPACER PROCEDURE FOR STEPH INFECTION)   TOTAL KNEE ARTHROPLASTY Right 03/30/2016   Procedure: RIGHT TOTAL KNEE ARTHROPLASTY;  Surgeon: Claiborne Crew, MD;  Location: [facility];  Service: Orthopedics;  Laterality: Right;   TOTAL SHOULDER ARTHROPLASTY Right 04/06/2013    Procedure: RIGHT TOTAL SHOULDER ARTHROPLASTY;  Surgeon: Glo Larch, MD;  Location: [facility];  Service: Orthopedics;  Laterality: Right;   TOTAL SHOULDER ARTHROPLASTY Left 07/20/2013   Procedure: LEFT TOTAL SHOULDER ARTHROPLASTY;  Surgeon: Glo Larch, MD;  Location: [facility];  Service: Orthopedics;  Laterality: Left;   TRANSURETHRAL RESECTION OF BLADDER TUMOR N/A 11/12/2015   Procedure: TRANSURETHRAL RESECTION OF BLADDER TUMOR (TURBT);  Surgeon: Christina Coyer, MD;  Location: [facility];  Service: Urology;  Laterality: N/A;   TRANSURETHRAL RESECTION OF BLADDER TUMOR WITH GYRUS (TURBT-GYRUS) N/A 12/05/2013   Procedure: TRANSURETHRAL RESECTION OF BLADDER TUMOR WITH GYRUS (TURBT-GYRUS);  Surgeon: Christina Coyer, MD;  Location[facility];  Service: Urology;  Laterality: N/A;   Social History:  reports that he quit smoking about 26 years ago. His smoking use included cigarettes. He started smoking about 66 years ago. He has a 80 pack-year smoking history. He has never used smokeless tobacco. He reports that he does not drink alcohol  and does not use drugs.  Allergies  Allergen Reactions  Codeine  Sulfate Other (See Comments)    Unknown     Family History  Problem Relation Age of Onset   Alzheimer's disease Mother    Heart attack Father    Heart disease Father    Arthritis Father    Hearing loss Father    Hypertension Father    Irregular heart beat Brother        DEFIB.   Congestive Heart Failure Brother    Heart disease Brother    Diabetes Daughter    Sleep apnea Neg Hx     Prior to Admission medications   Medication Sig Start Date End Date Taking? Authorizing Provider  Acetaminophen  Extra Strength 500 MG TABS Take 2 tablets by mouth every 6 (six) hours as needed (fever, pain). 05/22/23   [provider]  atorvastatin  (LIPITOR) 10 MG tablet Take 1 tablet (10 mg total) by mouth daily. 06/28/23 09/26/23  Wendie Hamburg, MD  azelastine   (ASTELIN ) 0.1 % nasal spray Place 1 spray into both nostrils 2 (two) times daily. Use in each nostril as directed 08/18/23   Anton Baton, NP  colchicine  0.6 MG tablet Take 0.6 mg by mouth daily.    [provider]  furosemide  (LASIX ) 20 MG tablet Take 1 tablet (20 mg) daily as needed if you gain more than 3 pounds in 1 day or 5 pounds in 1 week. 09/13/23   Eliodoro Guerin, DO  levothyroxine  (SYNTHROID ) 112 MCG tablet Take 112 mcg by mouth every morning.    [provider]  losartan  (COZAAR ) 25 MG tablet Take 1 tablet (25 mg total) by mouth daily. 07/12/23 10/10/23  Wendie Hamburg, MD  metoprolol  succinate (TOPROL -XL) 50 MG 24 hr tablet Take 1 tablet (50 mg total) by mouth daily. 09/13/23   Eliodoro Guerin, DO  Multiple Vitamin (MULTIVITAMIN) capsule Take 1 capsule by mouth daily.    [provider]  oxyCODONE  (OXY IR/ROXICODONE ) 5 MG immediate release tablet Take 5 mg by mouth every 6 (six) hours as needed for moderate pain (pain score 4-6). 05/22/23   [provider]  polyvinyl alcohol  (LIQUIFILM TEARS) 1.4 % ophthalmic solution Place 1 drop into both eyes as needed for dry eyes. 05/16/19   Sheth, Verena Glaser, MD  spironolactone  (ALDACTONE ) 25 MG tablet Take 12.5 mg by mouth daily. 05/18/23   [provider]    Physical Exam: Vitals:   10/21/23 1500 10/21/23 1530 10/21/23 1557 10/21/23 1800  BP: 115/64 118/68 (!) 119/92 136/66  Pulse: 82 80 90 84  Resp: (!) 23 (!) 24 20 (!) 24  Temp:   98.2 F (36.8 C) (!) 101.2 F (38.4 C)  TempSrc:   Oral Oral  SpO2: 97% 98% 95% 91%  Weight:      Height:    5\' 6"  (1.676 m)   General exam: No chest pain, no nausea vomiting.  Reporting feeling generally weak. Respiratory system: Good saturation on room air. Cardiovascular system:RRR.  No rubs or gallops.  Gastrointestinal system: Abdomen is nondistended, soft and no guarding.  Positive bowel sounds on exam. Central nervous system:  No  focal neurological deficits. Extremities: No cyanosis or clubbing. Skin: No petechiae. Psychiatry: Mood & affect appropriate.   Data Reviewed: Respiratory panel: Negative for COVID, influenza and RSV. Urinalysis: Positive nitrite, positive leukocyte esterase and moderate hemoglobin urine dipstick. Urine culture: Pending Comprehensive metabolic panel: Sodium 131, potassium 4.2, chloride 95, bicarb 23, BUN 34, creatinine 1.88, AST 43, ALT 22, alkaline phosphatase 50, GFR  37 and anion gap 13.   Assessment and Plan: 1-severe sepsis secondary to UTI - Prior history of ESBL - Fluid resuscitation per sepsis protocol, continue as needed antipyretics and will use IV meropenem  - Follow culture results - Continue supportive care - Patient with chronic ileal conduit after cystectomy due to bladder cancer.  2-chronic kidney disease stage IIIa/solitary kidney - For the most part appears to be stable and close to baseline - Continue minimizing nephrotoxic agents - Maintain adequate hydration - Follow renal function trend.  3-chronic systolic heart failure - Stable and compensated - Continue metoprolol  - Follow daily weights, strict I's and O's and low-sodium diet - Planning to resume Lasix , losartan  and spironolactone  on 10/22/2023 after completing fluid resuscitation per sepsis protocol.  4-hypertension - Stable for the most part - Continue to follow vital signs. - Minimizing home antihypertensive agents in the setting of acute sepsis at time of admission. - No signs.  5-history of thyroid  cancer - Status post thyroidectomy - Continue Synthroid   6-history of hyperlipidemia - Continue statin.  7-history of gout - No acute flare - Continue colchicine .  8-lactic acidosis - In the setting of severe sepsis - Fluid resuscitation will be provide - Continue sepsis treatment as mentioned above - Follow lactic acid trend.   Advance Care Planning:   Code Status: Full Code   Consults:  None  Family Communication: No family at bedside.  Severity of Illness: The appropriate patient status for this patient is INPATIENT. Inpatient status is judged to be reasonable and necessary in order to provide the required intensity of service to ensure the patient's safety. The patient's presenting symptoms, physical exam findings, and initial radiographic and laboratory data in the context of their chronic comorbidities is felt to place them at high risk for further clinical deterioration. Furthermore, it is not anticipated that the patient will be medically stable for discharge from the hospital within 2 midnights of admission.   * I certify that at the point of admission it is my clinical judgment that the patient will require inpatient hospital care spanning beyond 2 midnights from the point of admission due to high intensity of service, high risk for further deterioration and high frequency of surveillance required.*  Author: Justina Oman, MD 10/21/2023 6:56 PM  For on call review [email or web address].

## 2023-10-21 NOTE — Progress Notes (Signed)
   10/21/23 2151  TOC Brief Assessment  Insurance and Status Reviewed  Patient has primary care physician Yes  Home environment has been reviewed From home  Prior level of function: Independent  Prior/Current Home Services No current home services  Social Drivers of Health Review SDOH reviewed no interventions necessary  Readmission risk has been reviewed Yes  Transition of care needs no transition of care needs at this time   Pt c/recurrent UTIs. Hx of bladder cancer, cystoprostatectomy 10/2018, urostomy, left nephrectomy 07/2019, CKD3, thyroid  cancer, thyroidectomy 04/2023, significant cardiac history c/AICD.

## 2023-10-21 NOTE — ED Notes (Signed)
 Nurse waiting for lactic acid to result before pt can go to the floor per 300 nurse.
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
## 2023-10-21 NOTE — ED Notes (Signed)
 CBG= 121

## 2023-10-21 NOTE — Progress Notes (Signed)
   10/21/23 1800  Vitals  Temp (!) 101.2 F (38.4 C)  Temp Source Oral  BP 136/66  MAP (mmHg) 87  BP Location Left Arm  BP Method Automatic  Patient Position (if appropriate) Lying  Pulse Rate 84  Pulse Rate Source Apical  Resp (!) 24  Level of Consciousness  Level of Consciousness Responds to Voice  MEWS COLOR  MEWS Score Color Yellow  Oxygen  Therapy  SpO2 91 %  O2 Device Room Air  Pain Assessment  Pain Scale 0-10  Pain Score 7  Pain Type Chronic pain  Pain Location Back  Pain Orientation Lower  Patients Stated Pain Goal 3  Pain Intervention(s) Repositioned  Height and Weight  Height 5\' 6"  (1.676 m)  Weight in (lb) to have BMI = 25 154.6  Glasgow Coma Scale  Eye Opening 3  Best Verbal Response (NON-intubated) 5  Best Motor Response 6  Glasgow Coma Scale Score 14  MEWS Score  MEWS Temp 1  MEWS Systolic 0  MEWS Pulse 0  MEWS RR 1  MEWS LOC 1  MEWS Score 3   Pt sleeping but restless, occasional moan/groan. Does not respond to name called, will open eyes with touch but right back to sleep if not engaged. A&O x4 but confused conversation. VS as above, MD [facility] notified via [facility] page for MEWS score and LOC.
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
## 2023-10-22 DIAGNOSIS — E871 Hypo-osmolality and hyponatremia: Secondary | ICD-10-CM | POA: Diagnosis not present

## 2023-10-22 DIAGNOSIS — N179 Acute kidney failure, unspecified: Secondary | ICD-10-CM | POA: Diagnosis not present

## 2023-10-22 DIAGNOSIS — R4182 Altered mental status, unspecified: Secondary | ICD-10-CM | POA: Diagnosis not present

## 2023-10-22 DIAGNOSIS — A419 Sepsis, unspecified organism: Secondary | ICD-10-CM | POA: Diagnosis not present

## 2023-10-22 LAB — BASIC METABOLIC PANEL WITH GFR
Anion gap: 6 (ref 5–15)
BUN: 27 mg/dL — ABNORMAL HIGH (ref 8–23)
CO2: 24 mmol/L (ref 22–32)
Calcium: 8.5 mg/dL — ABNORMAL LOW (ref 8.9–10.3)
Chloride: 103 mmol/L (ref 98–111)
Creatinine, Ser: 1.45 mg/dL — ABNORMAL HIGH (ref 0.61–1.24)
GFR, Estimated: 50 mL/min — ABNORMAL LOW (ref >60)
Glucose, Bld: 116 mg/dL — ABNORMAL HIGH (ref 70–99)
Potassium: 3.8 mmol/L (ref 3.5–5.1)
Sodium: 133 mmol/L — ABNORMAL LOW (ref 135–145)

## 2023-10-22 LAB — CBC
HCT: 33.1 % — ABNORMAL LOW (ref 39.0–52.0)
Hemoglobin: 11.2 g/dL — ABNORMAL LOW (ref 13.0–17.0)
MCH: 32.8 pg (ref 26.0–34.0)
MCHC: 33.8 g/dL (ref 30.0–36.0)
MCV: 97.1 fL (ref 80.0–100.0)
Platelets: 84 10*3/uL — ABNORMAL LOW (ref 150–400)
RBC: 3.41 MIL/uL — ABNORMAL LOW (ref 4.22–5.81)
RDW: 14.7 % (ref 11.5–15.5)
WBC: 16.4 10*3/uL — ABNORMAL HIGH (ref 4.0–10.5)
nRBC: 0 % (ref 0.0–0.2)

## 2023-10-22 LAB — LACTIC ACID, PLASMA: Lactic Acid, Venous: 1 mmol/L (ref 0.5–1.9)

## 2023-10-22 MED ORDER — SPIRONOLACTONE 12.5 MG HALF TABLET
12.5000 mg | ORAL_TABLET | Freq: Every day | ORAL | Status: DC
  Administered 2023-10-22 – 2023-10-25 (×4): 12.5 mg via ORAL
  Filled 2023-10-22 (×4): qty 1

## 2023-10-22 MED ORDER — LOSARTAN POTASSIUM 50 MG PO TABS
25.0000 mg | ORAL_TABLET | Freq: Every day | ORAL | Status: DC
Start: 2023-10-23 — End: 2023-10-25
  Administered 2023-10-23 – 2023-10-25 (×3): 25 mg via ORAL
  Filled 2023-10-22 (×3): qty 1

## 2023-10-22 NOTE — Plan of Care (Signed)

## 2023-10-22 NOTE — Plan of Care (Signed)
   Problem: Education: Goal: Knowledge of General Education information will improve Description: Including pain rating scale, medication(s)/side effects and non-pharmacologic comfort measures Outcome: Progressing   Problem: Health Behavior/Discharge Planning: Goal: Ability to manage health-related needs will improve Outcome: Not Progressing   Problem: Clinical Measurements: Goal: Ability to maintain clinical measurements within normal limits will improve Outcome: Progressing

## 2023-10-22 NOTE — Progress Notes (Signed)
 Progress Note   Patient: Billy Rasmussen [id number] DOB: January 17, 1946 DOA: 10/21/2023     1 DOS: the patient was seen and examined on 10/22/2023   Brief hospital admission narrative course: JUANDAVID DALLMAN is a 78 y.o. male with medical history significant of combined systolic and diastolic heart failure, chronic kidney disease stage IIIa, thyroid  cancer status post thyroidectomy, history of bladder cancer status post cystectomy with urostomy in place, hyperlipidemia, hypertension and prior hospitalization secondary to ESBL UTI; who presented to the hospital secondary to altered mental status changes.     Symptoms present for the last 24-48 hours and worsening.  Family reporting similar presentation to previous UTI infections.   There has not been any chest pain, nausea, vomiting, sick contacts, focal neurologic deficits or any further complaints.   Workup in the ED demonstrated positive fever, WBCs 29.2, lactic acid 3.5 and elevated respiratory rate meeting criteria for severe sepsis having altered mental status/encephalopathy as organ dysfunction.   Fluid resuscitation initiated; cultures taken, antipyretics provided and IV antibiotic has been started.  Given prior history of ESBL patient has been started on meropenem .  [facility] has been contacted to place patient in the hospital for further evaluation and management.  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 -Still spiking fever - Continue as needed antipyretics, continue IV meropenem  and - Follow clinical response and culture results.  2-stage IIIa chronic kidney disease/solitary kidney - Renal function appears to be stable - Continue minimizing nephrotoxic agents - Continue treatment for acute UTI - Maintain adequate hydration - Follow renal function trend.  3-hypertension -  Stable - Resuming home antihypertensive agents.  4-chronic systolic heart failure - Euvolemic - Resuming home medication regimen - Low-sodium diet discussed with patient - Continue to follow daily weights and strict I's and O's.  5-history of hyperlipidemia - Continue steroids.  6-history of thyroid  cancer - Status post thyroidectomy - Continue Synthroid .  7-history of gout - No acute flare - Continue colchicine .  8-lactic acidosis - In the setting of sepsis - Resolved with fluid resuscitation - Repeat lactic acid 1.0  9-class I obesity - Low-calorie diet and portion control discussed with patient. -Body mass index is 33.89 kg/m.   10-thrombocytopenia - Appears to be associated with ongoing sepsis - No overt bleeding appreciated - Continue to follow platelet count trend.  Subjective:  In no major distress; reporting no chest pain or shortness of breath.  Still spiking fever.  Physical Exam: Vitals:   10/22/23 0925 10/22/23 1336 10/22/23 1548 10/22/23 1814  BP: (!) 119/52 123/60  125/63  Pulse: 80 81  78  Resp: (!) 21 (!) 23  (!) 21  Temp: 99.3 F (37.4 C) 99.7 F (37.6 C) (!) 101.1 F (38.4 C) 98.6 F (37 C)  TempSrc: Oral Oral  Oral  SpO2: 95% 97%  97%  Weight:      Height:       General exam: Alert, awake, oriented x 3; in no major distress.  Still spiking fever. Respiratory system: Clear to auscultation. Respiratory effort normal. Cardiovascular system: No rubs, no gallops, no JVD. Gastrointestinal system: Abdomen is nondistended, soft and nontender.  Positive bowel sounds.  Right mid abdomen with positive ileal conduit. Central nervous system: Alert and oriented. No focal neurological deficits. Extremities: No cyanosis or clubbing. Skin: No petechiae. Psychiatry: Judgement and insight appear normal. Mood & affect  appropriate.    Data Reviewed: CBC: WBC 16.4, hemoglobin 11.2 and platelet count 84K Basic metabolic panel: Sodium 133, potassium 3.8,  chloride 103, bicarb 24, BUN 27, creatinine 1.45-GFR 50  Family Communication: Wife at bedside.  Disposition: Status is: Inpatient Remains inpatient appropriate because: Continue IV antibiotics.  Time spent: 50 minutes  Author: Justina Oman, MD 10/22/2023 6:27 PM  For on call review [email or web address].

## 2023-10-22 NOTE — Progress Notes (Signed)
 Patient has brought CPAP unit from home but failed to bring complete mask. Patient wiling to sleep without unit until tomorrow when spouse can bring the mask.

## 2023-10-23 DIAGNOSIS — E871 Hypo-osmolality and hyponatremia: Secondary | ICD-10-CM | POA: Diagnosis not present

## 2023-10-23 DIAGNOSIS — N179 Acute kidney failure, unspecified: Secondary | ICD-10-CM | POA: Diagnosis not present

## 2023-10-23 DIAGNOSIS — A419 Sepsis, unspecified organism: Secondary | ICD-10-CM | POA: Diagnosis not present

## 2023-10-23 DIAGNOSIS — R4182 Altered mental status, unspecified: Secondary | ICD-10-CM | POA: Diagnosis not present

## 2023-10-23 NOTE — Progress Notes (Signed)
 Progress Note   Patient: Billy Rasmussen GNF:[id number] DOB: February 18, 1946 DOA: 10/21/2023     2 DOS: the patient was seen and examined on 10/23/2023   Brief hospital admission narrative course: Billy Rasmussen is a 78 y.o. male with medical history significant of combined systolic and diastolic heart failure, chronic kidney disease stage IIIa, thyroid  cancer status post thyroidectomy, history of bladder cancer status post cystectomy with urostomy in place, hyperlipidemia, hypertension and prior hospitalization secondary to ESBL UTI; who presented to the hospital secondary to altered mental status changes.     Symptoms present for the last 24-48 hours and worsening.  Family reporting similar presentation to previous UTI infections.   There has not been any chest pain, nausea, vomiting, sick contacts, focal neurologic deficits or any further complaints.   Workup in the ED demonstrated positive fever, WBCs 29.2, lactic acid 3.5 and elevated respiratory rate meeting criteria for severe sepsis having altered mental status/encephalopathy as organ dysfunction.   Fluid resuscitation initiated; cultures taken, antipyretics provided and IV antibiotic has been started.  Given prior history of ESBL patient has been started on meropenem .  [facility] has been contacted to place patient in the hospital for further evaluation and management.  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 - So far reaching 24 hours without fever and overall demonstrating improvement in his sepsis features. - Continue as needed antipyretics, continue IV meropenem  and follow final culture results to determine potential oral antibiotic option for discharge purposes. - Continue to follow clinical response   2-stage IIIa chronic kidney disease/solitary kidney - Renal function appears to  be stable - Continue minimizing nephrotoxic agents - Continue treatment for acute UTI - Maintain adequate hydration - Continue to follow follow renal function trend intermittently.  3-hypertension - Stable - Continue current antihypertensive agents.  4-chronic systolic heart failure - Euvolemic and is stable. - Resuming home medication regimen - Low-sodium diet discussed with patient - Continue to follow daily weights and strict I's and O's.  5-history of hyperlipidemia - Continue steroids.  6-history of thyroid  cancer - Status post thyroidectomy - Continue Synthroid .  7-history of gout - No acute flare - Continue colchicine .  8-lactic acidosis - In the setting of sepsis - Resolved with fluid resuscitation - Continue to maintain adequate hydration.  9-class I obesity - Low-calorie diet and portion control discussed with patient. -Body mass index is 33.89 kg/m.   10-thrombocytopenia - Appears to be associated with ongoing sepsis - No overt bleeding appreciated - Continue to follow platelet count trend intermittently.  Subjective:  So far reaching 24 hours without fever; no chest pain, no nausea, no vomiting, no shortness of breath.   Physical Exam: Vitals:   10/22/23 1548 10/22/23 1814 10/22/23 2027 10/23/23 0513  BP:  125/63 121/65 130/72  Pulse:  78  74  Resp:  (!) 21 18 16   Temp: (!) 101.1 F (38.4 C) 98.6 F (37 C) 98.6 F (37 C) 98.2 F (36.8 C)  TempSrc:  Oral Oral Oral  SpO2:  97% 97% 95%  Weight:      Height:       General exam: Alert, awake, oriented x 3; in no major distress. Respiratory system: Clear to auscultation. Respiratory effort normal.  Good saturation on room air. Cardiovascular system: No rubs, no gallops, no JVD. Gastrointestinal system: Abdomen is nondistended, soft and nontender. No organomegaly  or masses felt. Normal bowel sounds heard. Central nervous system: No focal neurological deficits. Extremities: No cyanosis or  clubbing. Skin: No petechiae. Psychiatry: Judgement and insight appear normal. Mood & affect appropriate.   Latest data Reviewed: CBC: WBC 16.4, hemoglobin 11.2 and platelet count 84K Basic metabolic panel: Sodium 133, potassium 3.8, chloride 103, bicarb 24, BUN 27, creatinine 1.45-GFR 50 Lactic acid: 1.0  Family Communication: Wife at bedside.  Disposition: Status is: Inpatient Remains inpatient appropriate because: Continue IV antibiotics.  Time spent: 50 minutes  Author: Justina Oman, MD 10/23/2023 2:41 PM  For on call review [email or web address].

## 2023-10-23 NOTE — Plan of Care (Signed)

## 2023-10-23 NOTE — Progress Notes (Signed)
 Mobility Specialist Progress Note:    10/23/23 1145  Mobility  Activity Ambulated with assistance in hallway  Level of Assistance Standby assist, set-up cues, supervision of patient - no hands on  Assistive Device Front wheel walker  Distance Ambulated (ft) 100 ft  Range of Motion/Exercises Active;All extremities  Activity Response Tolerated well  Mobility Referral Yes  Mobility visit 1 Mobility  Mobility Specialist Start Time (ACUTE ONLY) 1145  Mobility Specialist Stop Time (ACUTE ONLY) 1206  Mobility Specialist Time Calculation (min) (ACUTE ONLY) 21 min   Pt received in bed, agreeable to mobility. Required SBA to stand and ambulate with RW. Tolerated well,asx throughout. Returned pt supine, all needs met.  Juell Radney Mobility Specialist Please contact via Special educational needs teacher or  Rehab office at [phone]

## 2023-10-24 ENCOUNTER — Other Ambulatory Visit: Payer: Self-pay | Admitting: Family Medicine

## 2023-10-24 DIAGNOSIS — R4182 Altered mental status, unspecified: Secondary | ICD-10-CM | POA: Diagnosis not present

## 2023-10-24 DIAGNOSIS — E871 Hypo-osmolality and hyponatremia: Secondary | ICD-10-CM | POA: Diagnosis not present

## 2023-10-24 DIAGNOSIS — N179 Acute kidney failure, unspecified: Secondary | ICD-10-CM | POA: Diagnosis not present

## 2023-10-24 DIAGNOSIS — A419 Sepsis, unspecified organism: Secondary | ICD-10-CM | POA: Diagnosis not present

## 2023-10-24 LAB — BASIC METABOLIC PANEL WITH GFR
Anion gap: 5 (ref 5–15)
BUN: 24 mg/dL — ABNORMAL HIGH (ref 8–23)
CO2: 26 mmol/L (ref 22–32)
Calcium: 8.7 mg/dL — ABNORMAL LOW (ref 8.9–10.3)
Chloride: 101 mmol/L (ref 98–111)
Creatinine, Ser: 1.3 mg/dL — ABNORMAL HIGH (ref 0.61–1.24)
GFR, Estimated: 57 mL/min — ABNORMAL LOW (ref >60)
Glucose, Bld: 87 mg/dL (ref 70–99)
Potassium: 4 mmol/L (ref 3.5–5.1)
Sodium: 132 mmol/L — ABNORMAL LOW (ref 135–145)

## 2023-10-24 LAB — CBC
HCT: 33.9 % — ABNORMAL LOW (ref 39.0–52.0)
Hemoglobin: 11.2 g/dL — ABNORMAL LOW (ref 13.0–17.0)
MCH: 32.7 pg (ref 26.0–34.0)
MCHC: 33 g/dL (ref 30.0–36.0)
MCV: 99.1 fL (ref 80.0–100.0)
Platelets: 86 10*3/uL — ABNORMAL LOW (ref 150–400)
RBC: 3.42 MIL/uL — ABNORMAL LOW (ref 4.22–5.81)
RDW: 14.6 % (ref 11.5–15.5)
WBC: 7.4 10*3/uL (ref 4.0–10.5)
nRBC: 0 % (ref 0.0–0.2)

## 2023-10-24 MED ORDER — SODIUM CHLORIDE 0.9 % IV SOLN
1.0000 g | Freq: Three times a day (TID) | INTRAVENOUS | Status: DC
  Administered 2023-10-24 – 2023-10-25 (×3): 1 g via INTRAVENOUS
  Filled 2023-10-24 (×4): qty 20

## 2023-10-24 MED ORDER — BLISTEX MEDICATED EX OINT: TOPICAL_OINTMENT | CUTANEOUS | Status: DC | PRN

## 2023-10-24 MED ORDER — CARMEX CLASSIC LIP BALM EX OINT
TOPICAL_OINTMENT | CUTANEOUS | Status: DC | PRN
Start: 2023-10-24 — End: 2023-10-24

## 2023-10-24 NOTE — Progress Notes (Signed)
 Progress Note   Patient: Billy Rasmussen EAV:[id number] DOB: 1946-05-30 DOA: 10/21/2023     3 DOS: the patient was seen and examined on 10/24/2023   Brief hospital admission narrative course: Billy Rasmussen is a 78 y.o. male with medical history significant of combined systolic and diastolic heart failure, chronic kidney disease stage IIIa, thyroid  cancer status post thyroidectomy, history of bladder cancer status post cystectomy with urostomy in place, hyperlipidemia, hypertension and prior hospitalization secondary to ESBL UTI; who presented to the hospital secondary to altered mental status changes.     Symptoms present for the last 24-48 hours and worsening.  Family reporting similar presentation to previous UTI infections.   There has not been any chest pain, nausea, vomiting, sick contacts, focal neurologic deficits or any further complaints.   Workup in the ED demonstrated positive fever, WBCs 29.2, lactic acid 3.5 and elevated respiratory rate meeting criteria for severe sepsis having altered mental status/encephalopathy as organ dysfunction.   Fluid resuscitation initiated; cultures taken, antipyretics provided and IV antibiotic has been started.  Given prior history of ESBL patient has been started on meropenem .  [facility] has been contacted to place patient in the hospital for further evaluation and management.  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 - So far reaching 48 hours without fever and overall demonstrating almost complete resolution of his sepsis features. - Continue as needed antipyretics, continue IV meropenem  and follow final culture results to determine potential oral antibiotic option for discharge purposes. -So far patient's cultures demonstrating the presence of Pseudomonas species; sensitivity pending. - Continue  to follow clinical response   2-stage IIIa chronic kidney disease/solitary kidney - Renal function appears to be stable - Continue minimizing nephrotoxic agents - Continue treatment for acute UTI - Maintain adequate hydration - Continue to follow follow renal function trend intermittently.  3-hypertension - Stable - Continue current antihypertensive agents.  4-chronic systolic heart failure - Euvolemic and is stable. - Resuming home medication regimen - Low-sodium diet discussed with patient - Continue to follow daily weights and strict I's and O's.  5-history of hyperlipidemia - Continue steroids.  6-history of thyroid  cancer - Status post thyroidectomy - Continue Synthroid .  7-history of gout - No acute flare - Continue colchicine .  8-lactic acidosis - In the setting of sepsis - Resolved with fluid resuscitation - Continue to maintain adequate hydration.  9-class I obesity - Low-calorie diet and portion control discussed with patient. -Body mass index is 33.89 kg/m.   10-thrombocytopenia - Appears to be associated with ongoing sepsis - No overt bleeding appreciated - Continue to follow platelet count trend intermittently.  Subjective:  Over 36 hours without fever; no chest pain, no nausea, no vomiting.  Expressing feeling significantly better and without any overnight events reported.  Physical Exam: Vitals:   10/23/23 2030 10/24/23 0439 10/24/23 1134 10/24/23 1410  BP: 130/79 (!) 142/78 130/86 134/77  Pulse: 73 71 72 70  Resp: 20 20 20 18   Temp: 98.6 F (37 C) (!) 97.5 F (36.4 C) 98.3 F (36.8 C) 98.2 F (36.8 C)  TempSrc: Oral Oral Oral   SpO2: 98% 98% 97% 98%  Weight:      Height:       General exam: Alert, awake, oriented x 3; feeling much better and in no acute distress.  Currently afebrile. Respiratory system: Clear to auscultation. Respiratory effort  normal.  Good saturation on room air. Cardiovascular system: No rubs or gallops; no  JVD. Gastrointestinal system: Abdomen is nondistended, soft and nontender. No organomegaly or masses felt. Normal bowel sounds heard. Central nervous system: No focal neurological deficits. Extremities: No cyanosis or clubbing. Skin: No petechiae. Psychiatry: Judgement and insight appear normal. Mood & affect appropriate.    Latest data Reviewed: CBC: WBC 7.4, hemoglobin 11.2 and platelet count 96K Basic metabolic panel: Sodium 132, potassium 4.0, chloride 101, bicarb 26, BUN 24, creatinine 1.30 and GFR 57 Lactic acid: 1.0  Family Communication: Wife at bedside.  Disposition: Status is: Inpatient Remains inpatient appropriate because: Continue IV antibiotics.  Time spent: 50 minutes  Author: Justina Oman, MD 10/24/2023 5:51 PM  For on call review [email or web address].

## 2023-10-24 NOTE — Plan of Care (Signed)

## 2023-10-25 DIAGNOSIS — N179 Acute kidney failure, unspecified: Secondary | ICD-10-CM | POA: Diagnosis not present

## 2023-10-25 DIAGNOSIS — N390 Urinary tract infection, site not specified: Secondary | ICD-10-CM | POA: Diagnosis not present

## 2023-10-25 DIAGNOSIS — R4182 Altered mental status, unspecified: Secondary | ICD-10-CM | POA: Diagnosis not present

## 2023-10-25 DIAGNOSIS — A419 Sepsis, unspecified organism: Secondary | ICD-10-CM | POA: Diagnosis not present

## 2023-10-25 LAB — URINE CULTURE: Culture: 70000 — AB

## 2023-10-25 MED ORDER — CIPROFLOXACIN HCL 500 MG PO TABS: 500.0000 mg | ORAL_TABLET | Freq: Two times a day (BID) | ORAL | 0 refills | Status: AC

## 2023-10-25 MED ORDER — COLCHICINE 0.6 MG PO TABS: 0.6000 mg | ORAL_TABLET | Freq: Every day | ORAL | Status: DC

## 2023-10-25 NOTE — Care Management Important Message (Signed)
 Important Message  Patient Details  Name: Billy Rasmussen MRN: [id number] Date of Birth: 02-21-1946   Important Message Given:  Yes - Medicare IM     [name] 10/25/2023, 12:42 PM

## 2023-10-25 NOTE — Progress Notes (Signed)
 Nsg Discharge Note  Admit Date:  10/21/2023 Discharge date: 10/25/2023   Billy Rasmussen to be D/C'd Home per MD order.  AVS completed.  Copy for chart, and copy for patient signed, and dated. Patient/caregiver able to verbalize understanding.  Discharge Medication: Allergies as of 10/25/2023   No Active Allergies      Medication List     TAKE these medications    Acetaminophen  Extra Strength 500 MG Tabs Take 2 tablets by mouth every 6 (six) hours as needed (fever, pain).   atorvastatin  10 MG tablet Commonly known as: LIPITOR Take 1 tablet (10 mg total) by mouth daily.   azelastine  0.1 % nasal spray Commonly known as: ASTELIN  Place 1 spray into both nostrils 2 (two) times daily. Use in each nostril as directed   ciprofloxacin  500 MG tablet Commonly known as: Cipro  Take 1 tablet (500 mg total) by mouth 2 (two) times daily for 7 days.   colchicine  0.6 MG tablet Take 1 tablet (0.6 mg total) by mouth daily.   furosemide  20 MG tablet Commonly known as: LASIX  Take 1 tablet (20 mg) daily as needed if you gain more than 3 pounds in 1 day or 5 pounds in 1 week. What changed:  how much to take how to take this when to take this   levothyroxine  112 MCG tablet Commonly known as: SYNTHROID  Take 112 mcg by mouth every morning.   losartan  25 MG tablet Commonly known as: COZAAR  Take 1 tablet (25 mg total) by mouth daily.   metoprolol  succinate 50 MG 24 hr tablet Commonly known as: TOPROL -XL Take 1 tablet (50 mg total) by mouth daily.   multivitamin capsule Take 1 capsule by mouth daily.   oxyCODONE  5 MG immediate release tablet Commonly known as: Oxy IR/ROXICODONE  Take 5 mg by mouth every 6 (six) hours as needed for moderate pain (pain score 4-6).   polyvinyl alcohol  1.4 % ophthalmic solution Commonly known as: LIQUIFILM TEARS Place 1 drop into both eyes as needed for dry eyes.   spironolactone  25 MG tablet Commonly known as: ALDACTONE  Take 12.5 mg by mouth  daily.        Discharge Assessment: Vitals:   10/24/23 2036 10/25/23 0518  BP: 133/64 124/78  Pulse: 72 65  Resp: 18 16  Temp: 98.5 F (36.9 C) 97.9 F (36.6 C)  SpO2: 98% 97%   Skin clean, dry and intact without evidence of skin break down, no evidence of skin tears noted. IV catheter discontinued intact. Site without signs and symptoms of complications - no redness or edema noted at insertion site, patient denies c/o pain - only slight tenderness at site.  Dressing with slight pressure applied.  D/c Instructions-Education: Discharge instructions given to patient/family with verbalized understanding. D/c education completed with patient/family including follow up instructions, medication list, d/c activities limitations if indicated, with other d/c instructions as indicated by MD - patient able to verbalize understanding, all questions fully answered. Patient instructed to return to ED, call 911, or call MD for any changes in condition.  Patient escorted via WC, and D/C home via private auto.  Darrell Else, LPN 01/28/5620 3:08 PM

## 2023-10-25 NOTE — Progress Notes (Signed)
 Mobility Specialist Progress Note:    10/25/23 1050  Mobility  Activity Ambulated with assistance in hallway;Transferred from bed to chair  Level of Assistance Standby assist, set-up cues, supervision of patient - no hands on  Assistive Device Front wheel walker  Distance Ambulated (ft) 130 ft  Range of Motion/Exercises Active;All extremities  Activity Response Tolerated well  Mobility Referral Yes  Mobility visit 1 Mobility  Mobility Specialist Start Time (ACUTE ONLY) 1027  Mobility Specialist Stop Time (ACUTE ONLY) 1050  Mobility Specialist Time Calculation (min) (ACUTE ONLY) 23 min   Pt received in bed, agreeable to mobility. Required SBA to stand and ambulate with RW. Tolerated well, asx throughout. Returned to room, left pt in chair. Call bell in reach, all needs met.  Glinda Lapping Mobility Specialist Please contact via Special educational needs teacher or  Rehab office at [phone]
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
## 2023-10-26 ENCOUNTER — Encounter: Payer: Self-pay | Admitting: Family

## 2023-10-26 ENCOUNTER — Ambulatory Visit: Admitting: Family

## 2023-10-26 ENCOUNTER — Ambulatory Visit: Payer: Self-pay

## 2023-10-26 VITALS — BP 129/77 | HR 73 | Temp 97.2°F | Wt 217.2 lb

## 2023-10-26 DIAGNOSIS — Z9889 Other specified postprocedural states: Secondary | ICD-10-CM

## 2023-10-26 DIAGNOSIS — R6520 Severe sepsis without septic shock: Secondary | ICD-10-CM | POA: Diagnosis not present

## 2023-10-26 DIAGNOSIS — N390 Urinary tract infection, site not specified: Secondary | ICD-10-CM

## 2023-10-26 DIAGNOSIS — Z09 Encounter for follow-up examination after completed treatment for conditions other than malignant neoplasm: Secondary | ICD-10-CM | POA: Diagnosis not present

## 2023-10-26 LAB — CULTURE, BLOOD (ROUTINE X 2)
Culture: NO GROWTH
Culture: NO GROWTH
Special Requests: ADEQUATE
Special Requests: ADEQUATE

## 2023-10-26 NOTE — Telephone Encounter (Signed)
 Pt has appt with you today, if appropriate please refill.

## 2023-10-26 NOTE — Progress Notes (Signed)
 Subjective:    Patient ID: Billy Rasmussen, male    DOB: [birth year], 78 y.o.   MRN: [id number]  Chief Complaint  Patient presents with   Hospitalization Follow-up    Had uti. BP was low this morning. Now has burning at the top of stomach    Pt presents to the office today for hospital follow up. Went to the ED 10/21/23 for weakness, confusion, and fever. Diagnosed with a UTI with sepsis. Was discharged to 10/25/23.   Was given IV hydration and antibiotics. He was discharged home on cipro  for 4 days. He has urostomy  and followed by Urologists.  Urinary Tract Infection  This is a recurrent problem. There has been no fever. Associated symptoms include sweats. Pertinent negatives include no nausea or vomiting. He has tried antibiotics for the symptoms.  Hypertension This is a chronic problem. The current episode started more than 1 year ago. The problem has been resolved since onset. Associated symptoms include sweats. Pertinent negatives include no malaise/fatigue or peripheral edema. Risk factors for coronary artery disease include obesity and male gender. The current treatment provides moderate improvement.      Review of Systems  Constitutional:  Negative for malaise/fatigue.  Gastrointestinal:  Negative for nausea and vomiting.  All other systems reviewed and are negative.   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   Retired, Visual merchandiser   Social Drivers of Corporate investment banker Strain: Low Risk  (08/16/2023)   Overall Financial Resource Strain (CARDIA)    Difficulty of Paying Living Expenses: Not very hard  Recent Concern: Financial Resource Strain - Medium Risk (07/05/2023)   Overall Financial Resource Strain (CARDIA)    Difficulty of Paying Living Expenses: Somewhat hard  Food Insecurity: No Food Insecurity (10/21/2023)   Hunger Vital Sign    Worried About Running Out of Food in the Last Year: Never true    Ran Out of Food in the Last Year: Never true  Transportation Needs: No Transportation Needs (10/21/2023)   PRAPARE - Administrator, Civil Service (Medical): No    Lack of Transportation (Non-Medical): No  Physical Activity: Insufficiently Active (08/16/2023)   Exercise Vital Sign    Days of Exercise per Week: 3 days    Minutes of Exercise per Session: 10 min  Stress: No Stress Concern Present (08/16/2023)   [employer] of Occupational Health - Occupational Stress Questionnaire    Feeling of Stress : Not  at all  Social Connections: Socially Integrated (10/21/2023)   Social Connection and Isolation Panel [NHANES]    Frequency of Communication with Friends and Family: More than three times a week    Frequency of Social Gatherings with Friends and Family: More than three times a week    Attends Religious Services: 1 to 4 times per year    Active Member of [employer] or Organizations: Yes    Attends Banker Meetings: 1 to 4 times per year    Marital Status: Married   Family History  Problem Relation Age of Onset   Alzheimer's disease Mother    Heart attack Father    Heart disease Father     Arthritis Father    Hearing loss Father    Hypertension Father    Irregular heart beat Brother        DEFIB.   Congestive Heart Failure Brother    Heart disease Brother    Diabetes Daughter    Sleep apnea Neg Hx         Objective:   Physical Exam Vitals reviewed.  Constitutional:      General: He is not in acute distress.    Appearance: He is well-developed. He is obese.  HENT:     Head: Normocephalic.     Right Ear: External ear normal.     Left Ear: External ear normal.  Eyes:     General:        Right eye: No discharge.        Left eye: No discharge.     Pupils: Pupils are equal, round, and reactive to light.  Neck:     Thyroid : No thyromegaly.  Cardiovascular:     Rate and Rhythm: Normal rate and regular rhythm.     Heart sounds: Normal heart sounds. No murmur heard. Pulmonary:     Effort: Pulmonary effort is normal. No respiratory distress.     Breath sounds: Normal breath sounds. No wheezing.  Abdominal:     General: Bowel sounds are normal. There is no distension.     Palpations: Abdomen is soft.     Tenderness: There is no abdominal tenderness.  Genitourinary:    Comments: Urostomy present Musculoskeletal:        General: No tenderness. Normal range of motion.     Cervical back: Normal range of motion and neck supple.  Skin:    General: Skin is warm and dry.     Findings: No erythema or rash.  Neurological:     Mental Status: He is alert and oriented to person, place, and time.     Cranial Nerves: No cranial nerve deficit.     Deep Tendon Reflexes: Reflexes are normal and symmetric.  Psychiatric:        Behavior: Behavior normal.        Thought Content: Thought content normal.        Judgment: Judgment normal.       BP 129/77   Pulse 73   Temp (!) 97.2 F (36.2 C) (Temporal)   Wt 217 lb 3.2 oz (98.5 kg)   SpO2 96%   BMI 35.06 kg/m      Assessment & Plan:  WINDLE HUEBERT comes in today with chief complaint of Hospitalization Follow-up  (Had uti. BP was low this morning. Now has burning at the top of stomach )   Diagnosis and orders addressed:  1. Complicated UTI (urinary tract infection) (Primary) - CMP14+EGFR - CBC with Differential/Platelet  2. Hospital discharge follow-up - CMP14+EGFR - CBC with Differential/Platelet  3. History of urostomy - CMP14+EGFR - CBC with Differential/Platelet  4. Severe sepsis (HCC - CMP14+EGFR - CBC with Differential/Platelet   Labs pending Force fluids Complete cipro  Continue current medications  Keep follow up with specialists  Keep follow up with PCP    Tommas Fragmin, [name]

## 2023-10-26 NOTE — Telephone Encounter (Signed)
 Chief Complaint: Hypotension Symptoms: Sluggish and lightheaded Frequency: Ongoing today Pertinent Negatives: Patient denies n/a Disposition: [] ED /[] Urgent Care (no appt availability in office) / [x] Appointment(In office/virtual)/ []  [facility]/ [] Home Care/ [] Refused Recommended Disposition /[] [facility]/ []  Follow-up with PCP Additional Notes: Patient called in to report low bp of 88/65 when he woke up. Patient states he still took his Metoprolol  medication. Patient was just discharged from the hospital yesterday for being admitted for sepsis but states his bp was running good in the hospital. Patient initial bp reading of 88/65 was this morning at 7 am. This RN had patient recheck while on the phone and BP reading is 114/80 but unsure if it is accurate. Patient states he is feeling a little sluggish. Patient appt made for evaluation.     Copied from CRM [id number]. Topic: Clinical - Red Word Triage >> Oct 26, 2023  9:42 AM Danelle Dunning F wrote: Red Word that prompted transfer to Nurse Triage:   Low blood pressure 88/65 Reason for Disposition  [1] Systolic BP 90-110 AND [2] taking blood pressure medications AND [3] NOT dizzy, lightheaded or weak  Answer Assessment - Initial Assessment Questions 1. BLOOD PRESSURE: "What is the blood pressure?" "Did you take at least two measurements 5 minutes apart?"     88/65 - this morning around 7 am  2. ONSET: "When did you take your blood pressure?"     This morning 3. HOW: "How did you obtain the blood pressure?" (e.g., visiting nurse, automatic home BP monitor)     Automatic cuff 4. HISTORY: "Do you have a history of low blood pressure?" "What is your blood pressure normally?"     No patient takes BP meds 5. MEDICINES: "Are you taking any medications for blood pressure?" If Yes, ask: "Have they been changed recently?"     Yes patient is on bp meds 6. PULSE RATE: "Do you know what your pulse rate is?"      107 7. OTHER  SYMPTOMS: "Have you been sick recently?" "Have you had a recent injury?"     Patient recently hospitalized for sepsis  Protocols used: Blood Pressure - Low-A-AH

## 2023-10-26 NOTE — Telephone Encounter (Signed)
 Appt made

## 2023-10-26 NOTE — Patient Instructions (Signed)
 Asymptomatic Bacteriuria Asymptomatic bacteriuria is when you have a lot of germs called bacteria in your pee (urine), but they don't cause symptoms.  You may not need treatment for this condition. But you may be more likely to get an infection in the future. What are the causes? You may get more germs in your pee because of: Germs going into your urinary tract. Your urinary tract includes your kidneys, ureters, bladder, and urethra. Germs can get into your urinary tract during sex. A blockage in your urinary tract. This may be from a kidney stone or from an abnormal growth of cells called a tumor. Bladder problems that stop the bladder from emptying. What increases the risk? You're more likely to get this condition if: You have diabetes. You're an older adult. You're even more likely to get it if you live in a long-term care facility. You're male. You're pregnant. You have kidney stones. You've had a kidney transplant. You have a soft tube called a catheter that drains your pee. What are the signs or symptoms? There are no symptoms. How is this diagnosed? This condition is diagnosed with a pee test. It may be found when a sample of pee is taken to treat another condition, such as a problem with your kidney. If you're in your first trimester of pregnancy, you may be screened for this condition. How is this treated? In most cases, treatment isn't needed. Treating the condition can lead to other problems, such as: A yeast infection. The growth of antibiotic-resistant bacteria. These are germs that don't respond to treatment. But some people need to take antibiotics to prevent a kidney infection. You may need treatment if: You're having a procedure that affects your urinary tract. You've had a kidney transplant. You're pregnant. A kidney infection during pregnancy can lead to: Early labor. Very low birth weight. Newborn death. Follow these instructions at home: Medicines Take your  medicines only as told. Take your antibiotics as told. Do not stop taking them even if you start to feel better. General instructions Closely watch your condition for any changes. Drink enough fluid to keep your pee pale yellow. Pee more often to keep your bladder empty. If you're male, keep the area around your vagina and butt clean. Wipe from front to back after you pee or poop. Use each tissue only once when you wipe. Contact a health care provider if: You have symptoms of an infection. These symptoms may include: A burning feeling, or pain when you pee. A strong need to pee. Peeing more often. Pee that turns discolored or cloudy. Blood in your pee. Pee that smells bad or odd. A fever or chills. You have very bad pain in your back or lower belly. You faint. This information is not intended to replace advice given to you by your health care provider. Make sure you discuss any questions you have with your health care provider. Document Revised: 09/22/2022 Document Reviewed: 09/22/2022 Elsevier Patient Education  2024 [employer].

## 2023-10-27 ENCOUNTER — Telehealth: Payer: Self-pay | Admitting: *Deleted

## 2023-10-27 ENCOUNTER — Other Ambulatory Visit

## 2023-10-27 ENCOUNTER — Encounter: Payer: Self-pay | Admitting: Oncology

## 2023-10-27 DIAGNOSIS — A419 Sepsis, unspecified organism: Secondary | ICD-10-CM | POA: Diagnosis not present

## 2023-10-27 DIAGNOSIS — N390 Urinary tract infection, site not specified: Secondary | ICD-10-CM | POA: Diagnosis not present

## 2023-10-27 DIAGNOSIS — Z9889 Other specified postprocedural states: Secondary | ICD-10-CM | POA: Diagnosis not present

## 2023-10-27 DIAGNOSIS — Z09 Encounter for follow-up examination after completed treatment for conditions other than malignant neoplasm: Secondary | ICD-10-CM | POA: Diagnosis not present

## 2023-10-27 DIAGNOSIS — R6520 Severe sepsis without septic shock: Secondary | ICD-10-CM | POA: Diagnosis not present

## 2023-10-27 LAB — CBC WITH DIFFERENTIAL/PLATELET
Basophils Absolute: 0 10*3/uL (ref 0.0–0.2)
Basos: 1 %
EOS (ABSOLUTE): 0.1 10*3/uL (ref 0.0–0.4)
Eos: 3 %
Hematocrit: 37.1 % — ABNORMAL LOW (ref 37.5–51.0)
Hemoglobin: 12.3 g/dL — ABNORMAL LOW (ref 13.0–17.7)
Immature Grans (Abs): 0 10*3/uL (ref 0.0–0.1)
Immature Granulocytes: 1 %
Lymphocytes Absolute: 0.9 10*3/uL (ref 0.7–3.1)
Lymphs: 21 %
MCH: 33.2 pg — ABNORMAL HIGH (ref 26.6–33.0)
MCHC: 33.2 g/dL (ref 31.5–35.7)
MCV: 100 fL — ABNORMAL HIGH (ref 79–97)
Monocytes Absolute: 0.5 10*3/uL (ref 0.1–0.9)
Monocytes: 11 %
Neutrophils Absolute: 2.8 10*3/uL (ref 1.4–7.0)
Neutrophils: 63 %
Platelets: 153 10*3/uL (ref 150–450)
RBC: 3.7 x10E6/uL — ABNORMAL LOW (ref 4.14–5.80)
RDW: 14.1 % (ref 11.6–15.4)
WBC: 4.3 10*3/uL (ref 3.4–10.8)

## 2023-10-27 LAB — CMP14+EGFR
ALT: 410 IU/L — ABNORMAL HIGH (ref 0–44)
AST: 546 IU/L (ref 0–40)
Albumin: 4.1 g/dL (ref 3.8–4.8)
Alkaline Phosphatase: 373 IU/L — ABNORMAL HIGH (ref 44–121)
BUN/Creatinine Ratio: 16 (ref 10–24)
BUN: 26 mg/dL (ref 8–27)
Bilirubin Total: 2.1 mg/dL — ABNORMAL HIGH (ref 0.0–1.2)
CO2: 22 mmol/L (ref 20–29)
Calcium: 9.2 mg/dL (ref 8.6–10.2)
Chloride: 97 mmol/L (ref 96–106)
Creatinine, Ser: 1.63 mg/dL — ABNORMAL HIGH (ref 0.76–1.27)
Globulin, Total: 2.8 g/dL (ref 1.5–4.5)
Glucose: 82 mg/dL (ref 70–99)
Potassium: 5 mmol/L (ref 3.5–5.2)
Sodium: 136 mmol/L (ref 134–144)
Total Protein: 6.9 g/dL (ref 6.0–8.5)
eGFR: 44 mL/min/{1.73_m2} — ABNORMAL LOW (ref >59)

## 2023-10-27 NOTE — Transitions of Care (Post Inpatient/ED Visit) (Signed)
 10/27/2023  Name: Billy Rasmussen MRN: [id number] DOB: 1945/10/17  Today's TOC FU Call Status: Today's TOC FU Call Status:: Successful TOC FU Call Completed TOC FU Call Complete Date: 10/27/23 Patient's Name and Date of Birth confirmed.  Transition Care Management Follow-up Telephone Call Date of Discharge: 10/25/23 Discharge [facility] Type of Discharge: Inpatient Admission Primary Inpatient Discharge Diagnosis:: Severe sepsis secondary UTI How have you been since you were released from the hospital?: Better (eating, drinking well (mostly water ), ambulating without difficulty) Any questions or concerns?: No  Items Reviewed: Did you receive and understand the discharge instructions provided?: Yes Any new allergies since your discharge?: No Dietary orders reviewed?: Yes Type of Diet Ordered:: low sodium Do you have support at home?: Yes People in Home [RPT]: spouse Name of Support/Comfort Primary Source: Billy Rasmussen Reviewed signs/ symptoms of infection/ UTI  Medications Reviewed Today: Medications Reviewed Today     Reviewed by [name], RN (Registered Nurse) on 10/27/23 at 1529  Med List Status: <None>   Medication Order Taking? Sig Documenting Provider Last Dose Status Informant  Acetaminophen  Extra Strength 500 MG TABS [id number] Yes Take 2 tablets by mouth every 6 (six) hours as needed (fever, pain). [provider] Taking Active Self, Pharmacy Records  atorvastatin  (LIPITOR) 10 MG tablet [id number]  Take 1 tablet (10 mg total) by mouth daily. Wendie Hamburg, MD  Expired 10/21/23 2359 Self, Pharmacy Records  azelastine  (ASTELIN ) 0.1 % nasal spray [id number] Yes Place 1 spray into both nostrils 2 (two) times daily. Use in each nostril as directed [place] Greaser, Adell Hones, NP Taking Active Self, Pharmacy Records  ciprofloxacin  (CIPRO ) 500 MG tablet [id number] Yes Take 1 tablet (500 mg total) by mouth 2 (two) times daily for 7 days. Justina Oman, MD Taking Active   furosemide  (LASIX ) 20 MG tablet [id number] Yes Take 1 tablet (20 mg total) by mouth See admin instructions. Take 1 tablet (20 mg) daily as needed if you gain more than 3 pounds in 1 day or 5 pounds in 1 week. Yevette Hem, [name] Taking Active   levothyroxine  (SYNTHROID ) 112 MCG tablet [id number] Yes Take 112 mcg by mouth every morning. [provider] Taking Active Self, Pharmacy Records  losartan  (COZAAR ) 25 MG tablet [id number]  Take 1 tablet (25 mg total) by mouth daily. Wendie Hamburg, MD  Expired 10/21/23 2359 Self, Pharmacy Records  metoprolol  succinate (TOPROL -XL) 50 MG 24 hr tablet [id number] Yes Take 1 tablet (50 mg total) by mouth daily. Eliodoro Guerin, DO Taking Active Self, Pharmacy Records  Multiple Vitamin (MULTIVITAMIN) capsule [id number] Yes Take 1 capsule by mouth daily. [provider] Taking Active Self, Pharmacy Records  oxyCODONE  (OXY IR/ROXICODONE ) 5 MG immediate release tablet [id number] No Take 5 mg by mouth every 6 (six) hours as needed for moderate pain (pain score 4-6).  Patient not taking: Reported on 10/27/2023   [provider] Not Taking Active Self, Pharmacy Records           Med Note [name], [name] Oct 21, 2023  8:32 PM) Old prescription but patient states that he still has on hand as needed for pain  polyvinyl alcohol  (LIQUIFILM TEARS) 1.4 % ophthalmic solution [id number] Yes Place 1 drop into both eyes as needed for dry eyes. Nadean August, MD Taking Active Self, Pharmacy Records           Med Note [name] Feb 18, 2023  4:30 PM)    spironolactone  (ALDACTONE ) 25 MG tablet [id number] Yes Take 12.5 mg by mouth daily. [provider] Taking Active Self, Pharmacy Records            Home Care and Equipment/Supplies: Were Home Health Services Ordered?: No Any new equipment or medical supplies ordered?: No  Functional Questionnaire: Do you need assistance with meal  preparation?: No Do you need assistance with eating?: No Do you have difficulty maintaining continence: No Do you need assistance with getting out of bed/getting out of a chair/moving?: No Do you have difficulty managing or taking your medications?: No  Follow up appointments reviewed: PCP Follow-up appointment confirmed?: Yes Date of PCP follow-up appointment?: 10/26/23 Follow-up Provider: saw primary care provider yesterday Specialist Hospital Follow-up appointment confirmed?: No Reason Specialist Follow-Up Not Confirmed: Patient has Specialist Provider Number and will Call for Appointment Do you need transportation to your follow-up appointment?: No Do you understand care options if your condition(s) worsen?: Yes-patient verbalized understanding  SDOH Interventions Today    Flowsheet Row Most Recent Value  SDOH Interventions   Food Insecurity Interventions Intervention Not Indicated  Housing Interventions Intervention Not Indicated  Transportation Interventions Intervention Not Indicated  Utilities Interventions Intervention Not Indicated       Cecilie Coffee [facility], BSN RN Care Manager/ Transition of Care / [facility] Population Health [phone]

## 2023-10-28 ENCOUNTER — Ambulatory Visit: Payer: Self-pay | Admitting: Family

## 2023-10-28 ENCOUNTER — Ambulatory Visit: Payer: Self-pay

## 2023-10-28 ENCOUNTER — Emergency Department (HOSPITAL_COMMUNITY)

## 2023-10-28 ENCOUNTER — Other Ambulatory Visit: Payer: Self-pay

## 2023-10-28 ENCOUNTER — Emergency Department (HOSPITAL_COMMUNITY)
Admission: EM | Admit: 2023-10-28 | Discharge: 2023-10-28 | Disposition: A | Discharge status: Home / Self Care | Attending: Emergency Medicine | Admitting: Emergency Medicine

## 2023-10-28 ENCOUNTER — Other Ambulatory Visit: Payer: Self-pay | Admitting: Family

## 2023-10-28 ENCOUNTER — Encounter (HOSPITAL_COMMUNITY): Payer: Self-pay

## 2023-10-28 DIAGNOSIS — R945 Abnormal results of liver function studies: Secondary | ICD-10-CM | POA: Diagnosis not present

## 2023-10-28 DIAGNOSIS — K8020 Calculus of gallbladder without cholecystitis without obstruction: Secondary | ICD-10-CM | POA: Diagnosis not present

## 2023-10-28 DIAGNOSIS — R748 Abnormal levels of other serum enzymes: Secondary | ICD-10-CM | POA: Insufficient documentation

## 2023-10-28 DIAGNOSIS — R531 Weakness: Secondary | ICD-10-CM | POA: Diagnosis present

## 2023-10-28 DIAGNOSIS — K828 Other specified diseases of gallbladder: Secondary | ICD-10-CM | POA: Diagnosis not present

## 2023-10-28 DIAGNOSIS — R109 Unspecified abdominal pain: Secondary | ICD-10-CM | POA: Diagnosis not present

## 2023-10-28 DIAGNOSIS — C73 Malignant neoplasm of thyroid gland: Secondary | ICD-10-CM | POA: Diagnosis not present

## 2023-10-28 DIAGNOSIS — E039 Hypothyroidism, unspecified: Secondary | ICD-10-CM | POA: Diagnosis not present

## 2023-10-28 LAB — COMPREHENSIVE METABOLIC PANEL WITH GFR
ALT: 301 U/L — ABNORMAL HIGH (ref 0–44)
AST: 284 U/L — ABNORMAL HIGH (ref 15–41)
Albumin: 3.6 g/dL (ref 3.5–5.0)
Alkaline Phosphatase: 261 U/L — ABNORMAL HIGH (ref 38–126)
Anion gap: 13 (ref 5–15)
BUN: 24 mg/dL — ABNORMAL HIGH (ref 8–23)
CO2: 24 mmol/L (ref 22–32)
Calcium: 9.2 mg/dL (ref 8.9–10.3)
Chloride: 96 mmol/L — ABNORMAL LOW (ref 98–111)
Creatinine, Ser: 1.58 mg/dL — ABNORMAL HIGH (ref 0.61–1.24)
GFR, Estimated: 45 mL/min — ABNORMAL LOW (ref >60)
Glucose, Bld: 102 mg/dL — ABNORMAL HIGH (ref 70–99)
Potassium: 4 mmol/L (ref 3.5–5.1)
Sodium: 133 mmol/L — ABNORMAL LOW (ref 135–145)
Total Bilirubin: 1.4 mg/dL — ABNORMAL HIGH (ref 0.0–1.2)
Total Protein: 7.8 g/dL (ref 6.5–8.1)

## 2023-10-28 LAB — CBC WITH DIFFERENTIAL/PLATELET
Abs Immature Granulocytes: 0.05 10*3/uL (ref 0.00–0.07)
Basophils Absolute: 0 10*3/uL (ref 0.0–0.1)
Basophils Relative: 1 %
Eosinophils Absolute: 0.1 10*3/uL (ref 0.0–0.5)
Eosinophils Relative: 4 %
HCT: 38.2 % — ABNORMAL LOW (ref 39.0–52.0)
Hemoglobin: 13 g/dL (ref 13.0–17.0)
Immature Granulocytes: 1 %
Lymphocytes Relative: 25 %
Lymphs Abs: 1 10*3/uL (ref 0.7–4.0)
MCH: 33 pg (ref 26.0–34.0)
MCHC: 34 g/dL (ref 30.0–36.0)
MCV: 97 fL (ref 80.0–100.0)
Monocytes Absolute: 0.5 10*3/uL (ref 0.1–1.0)
Monocytes Relative: 12 %
Neutro Abs: 2.2 10*3/uL (ref 1.7–7.7)
Neutrophils Relative %: 57 %
Platelets: 174 10*3/uL (ref 150–400)
RBC: 3.94 MIL/uL — ABNORMAL LOW (ref 4.22–5.81)
RDW: 14.3 % (ref 11.5–15.5)
WBC: 3.8 10*3/uL — ABNORMAL LOW (ref 4.0–10.5)
nRBC: 0 % (ref 0.0–0.2)

## 2023-10-28 LAB — HEPATITIS PANEL, ACUTE
HCV Ab: NONREACTIVE
Hep A IgM: NONREACTIVE
Hep B C IgM: NONREACTIVE
Hepatitis B Surface Ag: NONREACTIVE

## 2023-10-28 LAB — URINALYSIS, ROUTINE W REFLEX MICROSCOPIC
Bilirubin Urine: NEGATIVE
Glucose, UA: NEGATIVE mg/dL
Hgb urine dipstick: NEGATIVE
Ketones, ur: NEGATIVE mg/dL
Leukocytes,Ua: NEGATIVE
Nitrite: NEGATIVE
Protein, ur: NEGATIVE mg/dL
Specific Gravity, Urine: 1.005 (ref 1.005–1.030)
pH: 6 (ref 5.0–8.0)

## 2023-10-28 LAB — PROTIME-INR
INR: 1.2 (ref 0.8–1.2)
Prothrombin Time: 15 s (ref 11.4–15.2)

## 2023-10-28 LAB — LIPASE, BLOOD: Lipase: 36 U/L (ref 11–51)

## 2023-10-28 NOTE — Discharge Instructions (Signed)
 Stop taking Tylenol .  You have been referred to the gastroenterologist. should follow-up with them next week.

## 2023-10-28 NOTE — ED Notes (Signed)
 Pt returned from US  Study.

## 2023-10-28 NOTE — ED Notes (Signed)
 ED Provider at bedside.

## 2023-10-28 NOTE — ED Triage Notes (Signed)
 Pt arrived via POV from home c/o elevated liver enzymes. Pt received a phone call from his PCP office and was advised to go to the ED for treatment.

## 2023-10-28 NOTE — Telephone Encounter (Signed)
 PT advised to go to ED related to lab work.

## 2023-10-28 NOTE — Telephone Encounter (Signed)
  Chief Complaint: itchiness/swelling  Symptoms: generalized itchiness and swelling, night sweats Frequency: about 2 days Pertinent Negatives: Patient denies blisters, spots, raised bumps, fever, facial swelling, difficulty breathing Disposition: [] ED /[] Urgent Care (no appt availability in office) / [x] Appointment(In office/virtual)/ []  [facility]/ [] Home Care/ [] Refused Recommended Disposition /[] [facility]/ []  Follow-up with PCP Additional Notes: Patient seen on 10/26/23 with [name] Tommas Fragmin, diagnosed with UTI and started on ciprofloxacin . He states he took Benadryl  last night. He states he is not sure if he had these symptoms on 10/26/23 when he was seen but that it has gotten worse since then. Patient unsure if this is an allergic reaction or side effect and holding his next dose until he can confirm with provider.  Copied from CRM [id number]. Topic: Clinical - Red Word Triage >> Oct 28, 2023 10:15 AM Stanly Early wrote: Red Word that prompted transfer to Nurse Triage: Allergic Reaction, swelling all over, itching, night sweats. ciprofloxacin  (CIPRO ) 500 MG tablet Reason for Disposition  Hives or itching  Answer Assessment - Initial Assessment Questions 1. APPEARANCE of RASH: "Describe the rash." (e.g., spots, blisters, raised areas, skin peeling, scaly)     Swelling, redness where he has been scratching.  2. SIZE: "How big are the spots?" (e.g., tip of pen, eraser, coin; inches, centimeters)     All over.  3. LOCATION: "Where is the rash located?"     Unsure, he thinks all over. He states the itchiness is all over.  4. COLOR: "What color is the rash?" (Note: It is difficult to assess rash color in people with darker-colored skin. When this situation occurs, simply ask the caller to describe what they see.)     He states he thinks it is only red where he has been scratching.  5. ONSET: "When did the rash begin?"     Unsure, he thinks maybe 2 days.  6. FEVER: "Do  you have a fever?" If Yes, ask: "What is your temperature, how was it measured, and when did it start?"     No.  7. ITCHING: "Does the rash itch?" If Yes, ask: "How bad is the itch?" (Scale 1-10; or mild, moderate, severe)     Yes, 5-6/10.  8. CAUSE: "What do you think is causing the rash?"     Thinks it is an allergic   9. NEW MEDICINES: "What new medicines are you taking?" (e.g., name of antibiotic) "When did you start taking this medication?".     Ciprofloxacin , started on 10/26/23.  10. OTHER SYMPTOMS: "Do you have any other symptoms?" (e.g., sore throat, fever, joint pain)       Night sweats.  11. PREGNANCY: "Is there any chance you are pregnant?" "When was your last menstrual period?"       N/A.  Protocols used: Rash - Widespread On Drugs-A-AH

## 2023-10-28 NOTE — Telephone Encounter (Signed)
 Please review and advise.

## 2023-10-28 NOTE — ED Notes (Signed)
 Patient transported to Ultrasound

## 2023-10-29 ENCOUNTER — Ambulatory Visit: Payer: Self-pay | Admitting: Family Medicine

## 2023-10-29 ENCOUNTER — Ambulatory Visit (INDEPENDENT_AMBULATORY_CARE_PROVIDER_SITE_OTHER): Admitting: Family Medicine

## 2023-10-29 ENCOUNTER — Encounter: Payer: Self-pay | Admitting: Family Medicine

## 2023-10-29 VITALS — BP 112/71 | HR 76 | Temp 97.7°F | Ht 66.0 in | Wt 210.4 lb

## 2023-10-29 DIAGNOSIS — N179 Acute kidney failure, unspecified: Secondary | ICD-10-CM

## 2023-10-29 DIAGNOSIS — K8020 Calculus of gallbladder without cholecystitis without obstruction: Secondary | ICD-10-CM

## 2023-10-29 DIAGNOSIS — R748 Abnormal levels of other serum enzymes: Secondary | ICD-10-CM

## 2023-10-29 DIAGNOSIS — T7840XA Allergy, unspecified, initial encounter: Secondary | ICD-10-CM | POA: Diagnosis not present

## 2023-10-29 DIAGNOSIS — L299 Pruritus, unspecified: Secondary | ICD-10-CM | POA: Diagnosis not present

## 2023-10-29 LAB — CMP14+EGFR
ALT: 270 IU/L — ABNORMAL HIGH (ref 0–44)
AST: 224 IU/L — ABNORMAL HIGH (ref 0–40)
Albumin: 4.3 g/dL (ref 3.8–4.8)
Alkaline Phosphatase: 299 IU/L — ABNORMAL HIGH (ref 44–121)
BUN/Creatinine Ratio: 15 (ref 10–24)
BUN: 22 mg/dL (ref 8–27)
Bilirubin Total: 0.8 mg/dL (ref 0.0–1.2)
CO2: 23 mmol/L (ref 20–29)
Calcium: 9.5 mg/dL (ref 8.6–10.2)
Chloride: 97 mmol/L (ref 96–106)
Creatinine, Ser: 1.48 mg/dL — ABNORMAL HIGH (ref 0.76–1.27)
Globulin, Total: 3.2 g/dL (ref 1.5–4.5)
Glucose: 81 mg/dL (ref 70–99)
Potassium: 4.7 mmol/L (ref 3.5–5.2)
Sodium: 134 mmol/L (ref 134–144)
Total Protein: 7.5 g/dL (ref 6.0–8.5)
eGFR: 49 mL/min/{1.73_m2} — ABNORMAL LOW (ref >59)

## 2023-10-29 NOTE — Patient Instructions (Signed)
 HOLD cholesterol medication this week as well until your liver enzymes go back to normal

## 2023-10-29 NOTE — Progress Notes (Signed)
 Subjective: CC: ER follow-up PCP: Billy Guerin, DO BJY:NWGNFAO W Billy Rasmussen is a 78 y.o. male presenting to clinic today for:  1.  ER follow-up Patient was seen in the ER yesterday for elevated LFTs.  He denies any abdominal pain, nausea, vomiting, jaundice.  He was recently hospitalized for sepsis and subsequently discharged on ciprofloxacin  to complete.  He does report itching with Cipro  but that has since resolved.  He had imaging in the ER which showed gallstones but no acute cholecystitis.  LFTs were downtrending at that time as well as bilirubin.  He did have an AKI however.   ROS: Per HPI  No Known Allergies Past Medical History:  Diagnosis Date   AICD (automatic cardioverter/defibrillator) present    [employer]   Anemia aprox. may-1-24   Arthritis    knees, shoulders, elbows   Bladder cancer [facility])    urologist-  dr Derrick Fling   CHF (congestive heart failure) (HCC)    Chronic kidney disease    left kidney removed   Diverticulosis of colon    Fibromyalgia    GERD (gastroesophageal reflux disease)    History of diverticulitis of colon    History of gastric ulcer    due to aleve   Hypertension    Lower urinary tract symptoms (LUTS)    Myocardial infarction (HCC) may 1-24   OSA (obstructive sleep apnea)    Wears CPAP nightly   Oxygen  deficiency    PONV (postoperative nausea and vomiting)    Psoriasis    Sepsis (HCC)    january 2021   Sleep apnea    Thyroid  disease    biopsy on 10-08-22   Tinnitus    right ear more, has tranmitter in right ear removable at hs   Ulcer    Wears glasses    Wears partial dentures     Current Outpatient Medications:    Acetaminophen  Extra Strength 500 MG TABS, Take 2 tablets by mouth every 6 (six) hours as needed (fever, pain)., Disp: , Rfl:    atorvastatin  (LIPITOR) 10 MG tablet, Take 1 tablet (10 mg total) by mouth daily., Disp: 90 tablet, Rfl: 3   azelastine  (ASTELIN ) 0.1 % nasal spray, Place 1 spray into both nostrils 2  (two) times daily. Use in each nostril as directed, Disp: 30 mL, Rfl: 12   ciprofloxacin  (CIPRO ) 500 MG tablet, Take 1 tablet (500 mg total) by mouth 2 (two) times daily for 7 days., Disp: 14 tablet, Rfl: 0   furosemide  (LASIX ) 20 MG tablet, Take 1 tablet (20 mg total) by mouth See admin instructions. Take 1 tablet (20 mg) daily as needed if you gain more than 3 pounds in 1 day or 5 pounds in 1 week., Disp: 90 tablet, Rfl: 1   levothyroxine  (SYNTHROID ) 112 MCG tablet, Take 112 mcg by mouth every morning., Disp: , Rfl:    losartan  (COZAAR ) 25 MG tablet, Take 1 tablet (25 mg total) by mouth daily., Disp: 90 tablet, Rfl: 3   metoprolol  succinate (TOPROL -XL) 50 MG 24 hr tablet, Take 1 tablet (50 mg total) by mouth daily., Disp: 90 tablet, Rfl: 0   Multiple Vitamin (MULTIVITAMIN) capsule, Take 1 capsule by mouth daily., Disp: , Rfl:    oxyCODONE  (OXY IR/ROXICODONE ) 5 MG immediate release tablet, Take 5 mg by mouth every 6 (six) hours as needed for moderate pain (pain score 4-6). 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   Tobacco Use   Smoking status: Former    Current packs/day: 0.00    Average packs/day: 2.0 packs/day for 40.0 years (80.0 ttl pk-yrs)    Types: Cigarettes    Start date: 06/01/1957    Quit date: 06/01/1997    Years since quitting: 26.4    Passive exposure: Past   Smokeless tobacco: Never  Vaping Use   Vaping status: Never Used  Substance and Sexual Activity    Alcohol  use: No   Drug use: No   Sexual activity: Not Currently    Comment: vesectomy  Other Topics Concern   Not on file  Social History Narrative   One level home with wife    [id number]/7051 - 13 year old MIL lives with them   Caffiene 2 cups daily, tea occas.   Retired, Visual merchandiser   Social Drivers of Corporate investment banker Strain: Low Risk  (08/16/2023)   Overall Financial Resource Strain (CARDIA)    Difficulty of Paying Living Expenses: Not very hard  Recent Concern: Financial Resource Strain - Medium Risk (07/05/2023)   Overall Financial Resource Strain (CARDIA)    Difficulty of Paying Living Expenses: Somewhat hard  Food Insecurity: No Food Insecurity (10/27/2023)   Hunger Vital Sign    Worried About Running Out of Food in the Last Year: Never true    Ran Out of Food in the Last Year: Never true  Transportation Needs: No Transportation Needs (10/27/2023)   PRAPARE - Administrator, Civil Service (Medical): No    Lack of Transportation (Non-Medical): No  Physical Activity: Insufficiently Active (08/16/2023)   Exercise Vital Sign    Days of Exercise per Week: 3 days    Minutes of Exercise per Session: 10 min  Stress: No Stress Concern Present (08/16/2023)   [employer] of Occupational Health - Occupational Stress Questionnaire    Feeling of Stress : Not at all  Social Connections: Socially Integrated (10/21/2023)   Social Connection and Isolation Panel [NHANES]    Frequency of Communication with Friends and Family: More than three times a week    Frequency of Social Gatherings with Friends and Family: More than three times a week    Attends Religious Services: 1 to 4 times per year    Active Member of [employer] or Organizations: Yes    Attends Banker Meetings: 1 to 4 times per year    Marital Status: Married  Catering manager Violence: Not At Risk (10/27/2023)   Humiliation, Afraid, Rape, and Kick questionnaire    Fear of Current or Ex-Partner: No     Emotionally Abused: No    Physically Abused: No    Sexually Abused: No   Family History  Problem Relation Age of Onset   Alzheimer's disease Mother    Heart attack Father    Heart disease Father    Arthritis Father    Hearing loss Father    Hypertension Father    Irregular heart beat Brother  DEFIB.   Congestive Heart Failure Brother    Heart disease Brother    Diabetes Daughter    Sleep apnea Neg Hx     Objective: Office vital signs reviewed. BP 112/71   Pulse 76   Temp 97.7 F (36.5 C)   Ht 5\' 6"  (1.676 m)   Wt 210 lb 6.4 oz (95.4 kg)   SpO2 97%   BMI 33.96 kg/m   Physical Examination:  General: Awake, alert, nontoxic male, No acute distress HEENT: sclera white, MMM. No jaundice Cardio: regular rate and rhythm  Pulm: Normal work of breathing on room air GI: soft, non-tender, non-distended, bowel sounds present x4, no hepatomegaly, no splenomegaly, no masses.  Has bag in place in the right upper quadrant   Results for orders placed or performed during the hospital encounter of 10/28/23 (from the past 24 hours)  Protime-INR     Status: None   Collection Time: 10/28/23  4:10 PM  Result Value Ref Range   Prothrombin Time 15.0 11.4 - 15.2 seconds   INR 1.2 0.8 - 1.2  Hepatitis panel, acute     Status: None   Collection Time: 10/28/23  4:10 PM  Result Value Ref Range   Hepatitis B Surface Ag NON REACTIVE NON REACTIVE   HCV Ab NON REACTIVE NON REACTIVE   Hep A IgM NON REACTIVE NON REACTIVE   Hep B C IgM NON REACTIVE NON REACTIVE  CBC with Differential     Status: Abnormal   Collection Time: 10/28/23  4:11 PM  Result Value Ref Range   WBC 3.8 (L) 4.0 - 10.5 K/uL   RBC 3.94 (L) 4.22 - 5.81 MIL/uL   Hemoglobin 13.0 13.0 - 17.0 g/dL   HCT 28.4 (L) 13.2 - 44.0 %   MCV 97.0 80.0 - 100.0 fL   MCH 33.0 26.0 - 34.0 pg   MCHC 34.0 30.0 - 36.0 g/dL   RDW 10.2 72.5 - 36.6 %   Platelets 174 150 - 400 K/uL   nRBC 0.0 0.0 - 0.2 %   Neutrophils Relative % 57 %    Neutro Abs 2.2 1.7 - 7.7 K/uL   Lymphocytes Relative 25 %   Lymphs Abs 1.0 0.7 - 4.0 K/uL   Monocytes Relative 12 %   Monocytes Absolute 0.5 0.1 - 1.0 K/uL   Eosinophils Relative 4 %   Eosinophils Absolute 0.1 0.0 - 0.5 K/uL   Basophils Relative 1 %   Basophils Absolute 0.0 0.0 - 0.1 K/uL   Immature Granulocytes 1 %   Abs Immature Granulocytes 0.05 0.00 - 0.07 K/uL  Comprehensive metabolic panel     Status: Abnormal   Collection Time: 10/28/23  4:11 PM  Result Value Ref Range   Sodium 133 (L) 135 - 145 mmol/L   Potassium 4.0 3.5 - 5.1 mmol/L   Chloride 96 (L) 98 - 111 mmol/L   CO2 24 22 - 32 mmol/L   Glucose, Bld 102 (H) 70 - 99 mg/dL   BUN 24 (H) 8 - 23 mg/dL   Creatinine, Ser 4.40 (H) 0.61 - 1.24 mg/dL   Calcium  9.2 8.9 - 10.3 mg/dL   Total Protein 7.8 6.5 - 8.1 g/dL   Albumin  3.6 3.5 - 5.0 g/dL   AST 347 (H) 15 - 41 U/L   ALT 301 (H) 0 - 44 U/L   Alkaline Phosphatase 261 (H) 38 - 126 U/L   Total Bilirubin 1.4 (H) 0.0 - 1.2 mg/dL   GFR, Estimated 45 (L) >60  mL/min   Anion gap 13 5 - 15  Lipase, blood     Status: None   Collection Time: 10/28/23  4:11 PM  Result Value Ref Range   Lipase 36 11 - 51 U/L  Urinalysis, Routine w reflex microscopic -Urine, Clean Catch     Status: None   Collection Time: 10/28/23  5:06 PM  Result Value Ref Range   Color, Urine YELLOW YELLOW   APPearance CLEAR CLEAR   Specific Gravity, Urine 1.005 1.005 - 1.030   pH 6.0 5.0 - 8.0   Glucose, UA NEGATIVE NEGATIVE mg/dL   Hgb urine dipstick NEGATIVE NEGATIVE   Bilirubin Urine NEGATIVE NEGATIVE   Ketones, ur NEGATIVE NEGATIVE mg/dL   Protein, ur NEGATIVE NEGATIVE mg/dL   Nitrite NEGATIVE NEGATIVE   Leukocytes,Ua NEGATIVE NEGATIVE   US  Abdomen Limited RUQ (LIVER/GB) Result Date: 10/28/2023 CLINICAL DATA:  Abdominal pain for 24 hours EXAM: ULTRASOUND ABDOMEN LIMITED RIGHT UPPER QUADRANT COMPARISON:  CT 10/21/2023. ultrasound 08/25/2023 FINDINGS: Gallbladder: Mildly distended gallbladder with  multiple shadowing stones. Slight wall thickening of 4 mm. No adjacent fluid. No reported sonographic Murphy's sign. Appearance is similar to the previous ultrasound. Common bile duct: Diameter: 5 mm Liver: No focal lesion identified. Within normal limits in parenchymal echogenicity. Portal vein is patent on color Doppler imaging with normal direction of blood flow towards the liver. Other: None. IMPRESSION: Multiple gallstones. Slight gallbladder wall thickening of 4 mm. No reported Murphy's crying or adjacent fluid. No ductal dilatation. Further evaluation as clinically appropriate. Electronically Signed   By: Adrianna Horde M.D.   On: 10/28/2023 17:55   Assessment/ Plan: 78 y.o. male   Elevated liver enzymes - Plan: CMP14+EGFR  Gallstones  AKI (acute kidney injury) (HCC) - Plan: CMP14+EGFR  Allergic reaction to drug, initial encounter  Recheck LFTs.  Will plan for standing order for LFT rechecks weekly until has normalized.  We discussed that if we do not continue to see a downtrend or if he develops any other concerning symptoms or signs he is to follow-up with gastroenterology immediately.  He did have gallstones on imaging and we may need to consider referral for that but at this time he was asymptomatic.  I do question if possible LFT elevation was secondary to recent sepsis.  He also indicated that his Synthroid  was recently adjusted by his specialist so may even be reflecting an adequately treated thyroid ?  Continue hydration to resolve AKI.  I have documented Cipro  allergy   Billy Guerin, DO [facility] [phone]

## 2023-10-29 NOTE — ED Provider Notes (Signed)
  EMERGENCY DEPARTMENT AT [facility] Provider Note   CSN: [id number] Arrival date & time: 10/28/23  1455     History  Chief Complaint  Patient presents with   Elevated Hepatic Enzymes    Billy Rasmussen is a 78 y.o. male.  Patient was sent over here because he had elevated liver enzymes.  Patient feels minimal weakness otherwise feels okay  The history is provided by the patient and medical records. No language interpreter was used.  Weakness Severity:  Mild Onset quality:  Sudden Timing:  Constant Progression:  Resolved Chronicity:  New Relieved by:  Nothing Worsened by:  Nothing Ineffective treatments:  None tried Associated symptoms: no abdominal pain, no chest pain, no cough, no diarrhea, no frequency, no headaches and no seizures        Home Medications Prior to Admission medications   Medication Sig Start Date End Date Taking? Authorizing Provider  Acetaminophen  Extra Strength 500 MG TABS Take 2 tablets by mouth every 6 (six) hours as needed (fever, pain). 05/22/23   [provider]  atorvastatin  (LIPITOR) 10 MG tablet Take 1 tablet (10 mg total) by mouth daily. 06/28/23 10/21/23  Wendie Hamburg, MD  azelastine  (ASTELIN ) 0.1 % nasal spray Place 1 spray into both nostrils 2 (two) times daily. Use in each nostril as directed 08/18/23   Anton Baton, NP  ciprofloxacin  (CIPRO ) 500 MG tablet Take 1 tablet (500 mg total) by mouth 2 (two) times daily for 7 days. 10/25/23 11/01/23  Justina Oman, MD  furosemide  (LASIX ) 20 MG tablet Take 1 tablet (20 mg total) by mouth See admin instructions. Take 1 tablet (20 mg) daily as needed if you gain more than 3 pounds in 1 day or 5 pounds in 1 week. 10/26/23   Tommas Fragmin A, [name]  levothyroxine  (SYNTHROID ) 112 MCG tablet Take 112 mcg by mouth every morning.    [provider]  losartan  (COZAAR ) 25 MG tablet Take 1 tablet (25 mg total) by mouth daily. 07/12/23 10/21/23  Wendie Hamburg, MD  metoprolol  succinate (TOPROL -XL) 50 MG 24 hr tablet Take 1 tablet (50 mg total) by mouth daily. 09/13/23   Eliodoro Guerin, DO  Multiple Vitamin (MULTIVITAMIN) capsule Take 1 capsule by mouth daily.    [provider]  oxyCODONE  (OXY IR/ROXICODONE ) 5 MG immediate release tablet Take 5 mg by mouth every 6 (six) hours as needed for moderate pain (pain score 4-6). Patient not taking: Reported on 10/27/2023 05/22/23   [provider]  polyvinyl alcohol  (LIQUIFILM TEARS) 1.4 % ophthalmic solution Place 1 drop into both eyes as needed for dry eyes. 05/16/19   Sheth, Verena Glaser, MD  spironolactone  (ALDACTONE ) 25 MG tablet Take 12.5 mg by mouth daily. 05/18/23   [provider]      Allergies    Patient has no active allergies.    Review of Systems   Review of Systems  Constitutional:  Negative for appetite change and fatigue.  HENT:  Negative for congestion, ear discharge and sinus pressure.   Eyes:  Negative for discharge.  Respiratory:  Negative for cough.   Cardiovascular:  Negative for chest pain.  Gastrointestinal:  Negative for abdominal pain and diarrhea.  Genitourinary:  Negative for frequency and hematuria.  Musculoskeletal:  Negative for back pain.  Skin:  Negative for rash.  Neurological:  Positive for weakness. Negative for seizures and headaches.  Psychiatric/Behavioral:  Negative for hallucinations.     Physical Exam Updated  Vital Signs BP (!) 141/81 (BP Location: Left Arm)   Pulse 68   Temp 98 F (36.7 C) (Oral)   Resp 16   Ht 5\' 6"  (1.676 m)   Wt 94.3 kg   SpO2 97%   BMI 33.57 kg/m  Physical Exam Vitals and nursing note reviewed.  Constitutional:      Appearance: He is well-developed.  HENT:     Head: Normocephalic.     Nose: Nose normal.  Eyes:     General: No scleral icterus.    Conjunctiva/sclera: Conjunctivae normal.  Neck:     Thyroid : No thyromegaly.  Cardiovascular:     Rate and Rhythm: Normal rate and  regular rhythm.     Heart sounds: No murmur heard.    No friction rub. No gallop.  Pulmonary:     Breath sounds: No stridor. No wheezing or rales.  Chest:     Chest wall: No tenderness.  Abdominal:     General: There is no distension.     Tenderness: There is no abdominal tenderness. There is no rebound.  Musculoskeletal:        General: Normal range of motion.     Cervical back: Neck supple.  Lymphadenopathy:     Cervical: No cervical adenopathy.  Skin:    Findings: No erythema or rash.  Neurological:     Mental Status: He is alert and oriented to person, place, and time.     Motor: No abnormal muscle tone.     Coordination: Coordination normal.  Psychiatric:        Behavior: Behavior normal.     ED Results / Procedures / Treatments   Labs (all labs ordered are listed, but only abnormal results are displayed) Labs Reviewed  CBC WITH DIFFERENTIAL/PLATELET - Abnormal; Notable for the following components:      Result Value   WBC 3.8 (*)    RBC 3.94 (*)    HCT 38.2 (*)    All other components within normal limits  COMPREHENSIVE METABOLIC PANEL WITH GFR - Abnormal; Notable for the following components:   Sodium 133 (*)    Chloride 96 (*)    Glucose, Bld 102 (*)    BUN 24 (*)    Creatinine, Ser 1.58 (*)    AST 284 (*)    ALT 301 (*)    Alkaline Phosphatase 261 (*)    Total Bilirubin 1.4 (*)    GFR, Estimated 45 (*)    All other components within normal limits  LIPASE, BLOOD  URINALYSIS, ROUTINE W REFLEX MICROSCOPIC  PROTIME-INR  HEPATITIS PANEL, ACUTE    EKG None  Radiology US  Abdomen Limited RUQ (LIVER/GB) Result Date: 10/28/2023 CLINICAL DATA:  Abdominal pain for 24 hours EXAM: ULTRASOUND ABDOMEN LIMITED RIGHT UPPER QUADRANT COMPARISON:  CT 10/21/2023. ultrasound 08/25/2023 FINDINGS: Gallbladder: Mildly distended gallbladder with multiple shadowing stones. Slight wall thickening of 4 mm. No adjacent fluid. No reported sonographic Murphy's sign. Appearance is  similar to the previous ultrasound. Common bile duct: Diameter: 5 mm Liver: No focal lesion identified. Within normal limits in parenchymal echogenicity. Portal vein is patent on color Doppler imaging with normal direction of blood flow towards the liver. Other: None. IMPRESSION: Multiple gallstones. Slight gallbladder wall thickening of 4 mm. No reported Murphy's crying or adjacent fluid. No ductal dilatation. Further evaluation as clinically appropriate. Electronically Signed   By: Adrianna Horde M.D.   On: 10/28/2023 17:55    Procedures Procedures    Medications Ordered in ED Medications -  No data to display  ED Course/ Medical Decision Making/ A&P                                 Medical Decision Making Amount and/or Complexity of Data Reviewed Labs: ordered. Radiology: ordered.  Elevated liver enzymes with gallstones.  Patient will stop taking Tylenol  and follow-up with GI        Final Clinical Impression(s) / ED Diagnoses Final diagnoses:  Elevated liver enzymes    Rx / DC Orders ED Discharge Orders     None         Cheyenne Cotta, MD 10/29/23 1137

## 2023-11-03 ENCOUNTER — Ambulatory Visit: Admitting: Family Medicine

## 2023-11-09 ENCOUNTER — Other Ambulatory Visit

## 2023-11-09 DIAGNOSIS — R748 Abnormal levels of other serum enzymes: Secondary | ICD-10-CM | POA: Diagnosis not present

## 2023-11-10 ENCOUNTER — Telehealth: Payer: Self-pay

## 2023-11-10 DIAGNOSIS — R748 Abnormal levels of other serum enzymes: Secondary | ICD-10-CM

## 2023-11-10 LAB — HEPATIC FUNCTION PANEL
ALT: 31 IU/L (ref 0–44)
AST: 45 IU/L — ABNORMAL HIGH (ref 0–40)
Albumin: 4.4 g/dL (ref 3.8–4.8)
Alkaline Phosphatase: 122 IU/L — ABNORMAL HIGH (ref 44–121)
Bilirubin Total: 0.6 mg/dL (ref 0.0–1.2)
Bilirubin, Direct: 0.17 mg/dL (ref 0.00–0.40)
Total Protein: 7.4 g/dL (ref 6.0–8.5)

## 2023-11-10 NOTE — Telephone Encounter (Signed)
 Copied from CRM [id number]. Topic: Clinical - Request for Lab/Test Order >> Nov 10, 2023  9:18 AM Essie A wrote: Reason for CRM: Patient just scheduled a lab visit for 11/15/23.  He will need a lab order placed for enzymes.  Please let him know when the order has been placed by in MyChart.

## 2023-11-11 NOTE — Telephone Encounter (Signed)
 He has a standing order for LFTs already. Just needs lab appt.

## 2023-11-11 NOTE — Telephone Encounter (Signed)
 Pt has lab appt scheduled. Will close encounter as pt has standing orders.

## 2023-11-15 ENCOUNTER — Other Ambulatory Visit

## 2023-11-15 DIAGNOSIS — R748 Abnormal levels of other serum enzymes: Secondary | ICD-10-CM | POA: Diagnosis not present

## 2023-11-16 ENCOUNTER — Ambulatory Visit: Payer: Self-pay | Admitting: Family Medicine

## 2023-11-16 ENCOUNTER — Encounter: Payer: Self-pay | Admitting: Oncology

## 2023-11-16 ENCOUNTER — Other Ambulatory Visit

## 2023-11-16 DIAGNOSIS — D631 Anemia in chronic kidney disease: Secondary | ICD-10-CM | POA: Diagnosis not present

## 2023-11-16 DIAGNOSIS — N390 Urinary tract infection, site not specified: Secondary | ICD-10-CM | POA: Diagnosis not present

## 2023-11-16 DIAGNOSIS — N189 Chronic kidney disease, unspecified: Secondary | ICD-10-CM | POA: Diagnosis not present

## 2023-11-16 DIAGNOSIS — R809 Proteinuria, unspecified: Secondary | ICD-10-CM | POA: Diagnosis not present

## 2023-11-16 LAB — CMP14+EGFR
ALT: 32 IU/L (ref 0–44)
AST: 47 IU/L — ABNORMAL HIGH (ref 0–40)
Albumin: 4.5 g/dL (ref 3.8–4.8)
Alkaline Phosphatase: 103 IU/L (ref 44–121)
BUN/Creatinine Ratio: 16 (ref 10–24)
BUN: 24 mg/dL (ref 8–27)
Bilirubin Total: 0.7 mg/dL (ref 0.0–1.2)
CO2: 23 mmol/L (ref 20–29)
Calcium: 9.3 mg/dL (ref 8.6–10.2)
Chloride: 102 mmol/L (ref 96–106)
Creatinine, Ser: 1.51 mg/dL — ABNORMAL HIGH (ref 0.76–1.27)
Globulin, Total: 2.8 g/dL (ref 1.5–4.5)
Glucose: 86 mg/dL (ref 70–99)
Potassium: 4.6 mmol/L (ref 3.5–5.2)
Sodium: 140 mmol/L (ref 134–144)
Total Protein: 7.3 g/dL (ref 6.0–8.5)
eGFR: 48 mL/min/{1.73_m2} — ABNORMAL LOW (ref >59)

## 2023-11-16 LAB — HEPATIC FUNCTION PANEL: Bilirubin, Direct: 0.26 mg/dL (ref 0.00–0.40)

## 2023-11-18 DIAGNOSIS — R809 Proteinuria, unspecified: Secondary | ICD-10-CM | POA: Diagnosis not present

## 2023-11-18 DIAGNOSIS — I130 Hypertensive heart and chronic kidney disease with heart failure and stage 1 through stage 4 chronic kidney disease, or unspecified chronic kidney disease: Secondary | ICD-10-CM | POA: Diagnosis not present

## 2023-11-18 DIAGNOSIS — N1831 Chronic kidney disease, stage 3a: Secondary | ICD-10-CM | POA: Diagnosis not present

## 2023-11-18 DIAGNOSIS — I5042 Chronic combined systolic (congestive) and diastolic (congestive) heart failure: Secondary | ICD-10-CM | POA: Diagnosis not present

## 2023-12-02 ENCOUNTER — Ambulatory Visit (INDEPENDENT_AMBULATORY_CARE_PROVIDER_SITE_OTHER): Payer: Medicare HMO

## 2023-12-02 DIAGNOSIS — I5042 Chronic combined systolic (congestive) and diastolic (congestive) heart failure: Secondary | ICD-10-CM | POA: Diagnosis not present

## 2023-12-02 LAB — CUP PACEART REMOTE DEVICE CHECK
Battery Remaining Longevity: 92 mo
Battery Remaining Percentage: 90 %
Battery Voltage: 3.01 V
Brady Statistic AP VP Percent: 23 %
Brady Statistic AP VS Percent: 1 %
Brady Statistic AS VP Percent: 76 %
Brady Statistic AS VS Percent: 1 %
Brady Statistic RA Percent Paced: 23 %
Date Time Interrogation Session: 20250703020119
HighPow Impedance: 74 Ohm
Implantable Lead Connection Status: 753985
Implantable Lead Connection Status: 753985
Implantable Lead Connection Status: 753985
Implantable Lead Implant Date: 20240930
Implantable Lead Implant Date: 20240930
Implantable Lead Implant Date: 20240930
Implantable Lead Location: 753858
Implantable Lead Location: 753859
Implantable Lead Location: 753860
Implantable Pulse Generator Implant Date: 20240930
Lead Channel Impedance Value: 1325 Ohm
Lead Channel Impedance Value: 400 Ohm
Lead Channel Impedance Value: 450 Ohm
Lead Channel Pacing Threshold Amplitude: 0.375 V
Lead Channel Pacing Threshold Amplitude: 0.625 V
Lead Channel Pacing Threshold Amplitude: 1.5 V
Lead Channel Pacing Threshold Pulse Width: 0.4 ms
Lead Channel Pacing Threshold Pulse Width: 0.4 ms
Lead Channel Pacing Threshold Pulse Width: 0.5 ms
Lead Channel Sensing Intrinsic Amplitude: 11.8 mV
Lead Channel Sensing Intrinsic Amplitude: 2.3 mV
Lead Channel Setting Pacing Amplitude: 1.375
Lead Channel Setting Pacing Amplitude: 1.625
Lead Channel Setting Pacing Amplitude: 2 V
Lead Channel Setting Pacing Pulse Width: 0.4 ms
Lead Channel Setting Pacing Pulse Width: 0.4 ms
Lead Channel Setting Sensing Sensitivity: 0.5 mV
Pulse Gen Serial Number: 211030215
Zone Setting Status: 755011

## 2023-12-06 ENCOUNTER — Ambulatory Visit: Payer: Self-pay | Admitting: Cardiovascular Disease

## 2023-12-07 ENCOUNTER — Telehealth: Payer: Self-pay

## 2023-12-07 NOTE — Telephone Encounter (Signed)
 Nonfasting. Pt notified. LS

## 2023-12-07 NOTE — Telephone Encounter (Signed)
 Copied from CRM [id number]. Topic: Clinical - Medical Advice >> Dec 07, 2023  2:24 PM Gustabo D wrote: Patient wants know if he needs to fast for his appointment tomorrow please call back

## 2023-12-08 ENCOUNTER — Other Ambulatory Visit: Payer: Self-pay | Admitting: Family Medicine

## 2023-12-08 ENCOUNTER — Other Ambulatory Visit

## 2023-12-08 DIAGNOSIS — R748 Abnormal levels of other serum enzymes: Secondary | ICD-10-CM | POA: Diagnosis not present

## 2023-12-09 LAB — HEPATIC FUNCTION PANEL
ALT: 27 IU/L (ref 0–44)
AST: 45 IU/L — ABNORMAL HIGH (ref 0–40)
Albumin: 4.8 g/dL (ref 3.8–4.8)
Alkaline Phosphatase: 78 IU/L (ref 44–121)
Bilirubin Total: 0.8 mg/dL (ref 0.0–1.2)
Bilirubin, Direct: 0.26 mg/dL (ref 0.00–0.40)
Total Protein: 7.5 g/dL (ref 6.0–8.5)

## 2023-12-10 ENCOUNTER — Ambulatory Visit: Payer: Self-pay | Admitting: Family Medicine

## 2023-12-10 ENCOUNTER — Ambulatory Visit (HOSPITAL_COMMUNITY)
Admission: RE | Admit: 2023-12-10 | Discharge: 2023-12-10 | Disposition: A | Discharge status: Home / Self Care | Source: Ambulatory Visit | Attending: Urology | Admitting: Urology

## 2023-12-10 ENCOUNTER — Ambulatory Visit: Payer: Self-pay

## 2023-12-10 ENCOUNTER — Other Ambulatory Visit (HOSPITAL_COMMUNITY): Payer: Self-pay | Admitting: Urology

## 2023-12-10 DIAGNOSIS — Z950 Presence of cardiac pacemaker: Secondary | ICD-10-CM | POA: Diagnosis not present

## 2023-12-10 DIAGNOSIS — K4020 Bilateral inguinal hernia, without obstruction or gangrene, not specified as recurrent: Secondary | ICD-10-CM | POA: Diagnosis not present

## 2023-12-10 DIAGNOSIS — Z96611 Presence of right artificial shoulder joint: Secondary | ICD-10-CM | POA: Diagnosis not present

## 2023-12-10 DIAGNOSIS — C678 Malignant neoplasm of overlapping sites of bladder: Secondary | ICD-10-CM | POA: Insufficient documentation

## 2023-12-10 DIAGNOSIS — Z8551 Personal history of malignant neoplasm of bladder: Secondary | ICD-10-CM | POA: Diagnosis not present

## 2023-12-10 DIAGNOSIS — K8020 Calculus of gallbladder without cholecystitis without obstruction: Secondary | ICD-10-CM | POA: Diagnosis not present

## 2023-12-10 DIAGNOSIS — K5730 Diverticulosis of large intestine without perforation or abscess without bleeding: Secondary | ICD-10-CM | POA: Diagnosis not present

## 2023-12-10 DIAGNOSIS — Z96612 Presence of left artificial shoulder joint: Secondary | ICD-10-CM | POA: Diagnosis not present

## 2023-12-10 DIAGNOSIS — Z905 Acquired absence of kidney: Secondary | ICD-10-CM | POA: Diagnosis not present

## 2023-12-10 NOTE — Telephone Encounter (Signed)
 Does pt need to have repeat LFT?

## 2023-12-10 NOTE — Telephone Encounter (Signed)
  FYI Only or Action Required?: Action required by provider: would like to know if he should continue to go have labs done every two to three weeks.  Patient was last seen in primary care on 10/29/2023 by [name], DO.  Called Nurse Triage reporting Advice Only.  Symptoms began today.  Interventions attempted: Nothing.  Symptoms are: unchanged.  Triage Disposition: Home Care  Patient/caregiver understands and will follow disposition?:      Copied from CRM [id number]. Topic: Clinical - Lab/Test Results >> Dec 10, 2023 11:05 AM Wess RAMAN wrote: Reason for CRM: Has further questions about labs. Reason for Disposition  Nursing judgment  Protocols used: No Guideline or Reference Available-A-AH

## 2023-12-10 NOTE — Telephone Encounter (Signed)
Pt aware of provider feedback and voiced understanding. 

## 2023-12-10 NOTE — Telephone Encounter (Signed)
 No ok to dc lab draw

## 2023-12-13 ENCOUNTER — Encounter: Payer: Self-pay | Admitting: Family Medicine

## 2023-12-13 ENCOUNTER — Ambulatory Visit: Payer: Self-pay | Admitting: Family Medicine

## 2023-12-13 VITALS — BP 109/64 | HR 60 | Temp 98.9°F | Ht 66.0 in | Wt 218.0 lb

## 2023-12-13 DIAGNOSIS — E039 Hypothyroidism, unspecified: Secondary | ICD-10-CM | POA: Diagnosis not present

## 2023-12-13 DIAGNOSIS — E042 Nontoxic multinodular goiter: Secondary | ICD-10-CM

## 2023-12-13 DIAGNOSIS — Z0001 Encounter for general adult medical examination with abnormal findings: Secondary | ICD-10-CM | POA: Diagnosis not present

## 2023-12-13 DIAGNOSIS — N1831 Chronic kidney disease, stage 3a: Secondary | ICD-10-CM

## 2023-12-13 DIAGNOSIS — Z9889 Other specified postprocedural states: Secondary | ICD-10-CM | POA: Diagnosis not present

## 2023-12-13 DIAGNOSIS — C679 Malignant neoplasm of bladder, unspecified: Secondary | ICD-10-CM

## 2023-12-13 DIAGNOSIS — I7121 Aneurysm of the ascending aorta, without rupture: Secondary | ICD-10-CM

## 2023-12-13 DIAGNOSIS — I5020 Unspecified systolic (congestive) heart failure: Secondary | ICD-10-CM | POA: Diagnosis not present

## 2023-12-13 DIAGNOSIS — Z9079 Acquired absence of other genital organ(s): Secondary | ICD-10-CM

## 2023-12-13 DIAGNOSIS — Z0000 Encounter for general adult medical examination without abnormal findings: Secondary | ICD-10-CM

## 2023-12-13 NOTE — Progress Notes (Signed)
 Billy Rasmussen is a 78 y.o. male presents to office today for annual physical exam examination.    Concerns today include: 1.  None.  Overall he is stable since our last office visit.  He saw endocrinology and had labs drawn this morning.  He will be seeing his bladder specialist at the end of the month.  Occupation: retired, Marital status: married to peggy, Substance use: none Health Maintenance Due  Topic Date Due   COVID-19 Vaccine (3 - Moderna risk series) 01/09/2020   Refills needed today: none  Immunization History  Administered Date(s) Administered   Fluad Quad(high Dose 65+) 02/08/2019, 04/10/2020, 02/16/2022   Fluad Trivalent(High Dose 65+) 04/05/2023   Influenza, High Dose Seasonal PF 04/15/2017, 04/16/2021   Influenza, Seasonal, Injecte, Preservative Fre 02/28/2009   Influenza,inj,Quad PF,6+ Mos 02/25/2016, 03/01/2018   Moderna Sars-Covid-2 Vaccination 11/14/2019, 12/12/2019   Pneumococcal Conjugate-13 08/21/2019   Pneumococcal Polysaccharide-23 12/27/2017   Td (Adult),5 Lf Tetanus Toxid, Preservative Free 08/15/2020   Tdap 08/15/2020   Zoster Recombinant(Shingrix ) 09/19/2021, 04/21/2022   Past Medical History:  Diagnosis Date   AICD (automatic cardioverter/defibrillator) present    Abbott/St Jude   Anemia aprox. may-1-24   Arthritis    knees, shoulders, elbows   Bladder cancer [facility])    urologist-  dr nieves   CHF (congestive heart failure) (HCC)    Chronic kidney disease    left kidney removed   Diverticulosis of colon    Fibromyalgia    GERD (gastroesophageal reflux disease)    History of diverticulitis of colon    History of gastric ulcer    due to aleve   Hypertension    Lower urinary tract symptoms (LUTS)    Myocardial infarction (HCC) may 1-24   OSA (obstructive sleep apnea)    Wears CPAP nightly   Oxygen  deficiency    PONV (postoperative nausea and vomiting)    Psoriasis    Sepsis (HCC)    january 2021   Sleep apnea    Thyroid  disease     biopsy on 10-08-22   Tinnitus    right ear more, has tranmitter in right ear removable at hs   Ulcer    Wears glasses    Wears partial dentures    Social History   Socioeconomic History   Marital status: Married    Spouse name: Peggy   Number of children: 2   Years of education: 14   Highest education level: Tax adviser degree: occupational, Scientist, product/process development, or vocational program  Occupational History   Occupation: retired    Comment: Pharmacologist, 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   Tobacco Use   Smoking status: Former    Current packs/day: 0.00    Average packs/day: 2.0 packs/day for 40.0 years (80.0 ttl pk-yrs)    Types: Cigarettes    Start date: 06/01/1957    Quit date: 06/01/1997    Years since quitting: 26.5    Passive exposure: Past   Smokeless tobacco: Never  Vaping Use   Vaping status: Never Used  Substance and Sexual Activity   Alcohol  use: No   Drug use: No   Sexual activity: Not Currently    Comment: vesectomy  Other Topics Concern   Not on file  Social History Narrative   One level home with wife    [id number] - 26 year old MIL lives with them   Caffiene 2 cups daily, tea occas.   Retired, Visual merchandiser   Social Drivers of [employer]: Low Risk  (08/16/2023)  Overall Financial Resource Strain (CARDIA)    Difficulty of Paying Living Expenses: Not very hard  Recent Concern: Financial Resource Strain - Medium Risk (07/05/2023)   Overall Financial Resource Strain (CARDIA)    Difficulty of Paying Living Expenses: Somewhat hard  Food Insecurity: No Food Insecurity (12/10/2023)   Hunger Vital Sign    Worried About Running Out of Food in the Last Year: Never true    Ran Out of Food in the Last Year: Never true  Transportation Needs: No Transportation Needs (12/10/2023)   PRAPARE - Administrator, Civil Service (Medical): No    Lack of Transportation (Non-Medical): No  Physical Activity: Insufficiently Active (08/16/2023)   Exercise Vital Sign    Days of Exercise  per Week: 3 days    Minutes of Exercise per Session: 10 min  Stress: No Stress Concern Present (12/10/2023)   [employer] of Occupational Health - Occupational Stress Questionnaire    Feeling of Stress: Not at all  Social Connections: Socially Integrated (12/10/2023)   Social Connection and Isolation Panel    Frequency of Communication with Friends and Family: More than three times a week    Frequency of Social Gatherings with Friends and Family: Twice a week    Attends Religious Services: 1 to 4 times per year    Active Member of [employer] or Organizations: Yes    Attends Banker Meetings: 1 to 4 times per year    Marital Status: Married  Catering manager Violence: Not At Risk (10/27/2023)   Humiliation, Afraid, Rape, and Kick questionnaire    Fear of Current or Ex-Partner: No    Emotionally Abused: No    Physically Abused: No    Sexually Abused: No   Past Surgical History:  Procedure Laterality Date   APPENDECTOMY     BIV ICD INSERTION CRT-D N/A 03/01/2023   Procedure: BIV ICD INSERTION CRT-D;  Surgeon: Mealor, Eulas BRAVO, MD;  [facility];  Service: Cardiovascular;  Laterality: N/A;   COLECTOMY W/ COLOSTOMY  1996   W/   APPENDECTOMY   COLONOSCOPY N/A 05/11/2018   Procedure: COLONOSCOPY;  Surgeon: Shaaron Lamar HERO, MD;  Location: [facility];  Service: Endoscopy;  Laterality: N/A;  9:30   COLOSTOMY TAKEDOWN  1996   CYSTOSCOPY WITH BIOPSY N/A 12/05/2013   Procedure: CYSTO BLADDER BIOPSY AND FULGERATION;  Surgeon: Donnice Brooks, MD;  Location: [facility];  Service: Urology;  Laterality: N/A;   CYSTOSCOPY WITH BIOPSY Bilateral 11/13/2014   Procedure: CYSTOSCOPY WITH  BLADDER BIOPSY FULGERATION AND BILATERAL RETROGRADE PYELOGRAMS;  Surgeon: Donnice Brooks, MD;  Location[facility];  Service: Urology;  Laterality: Bilateral;   CYSTOSCOPY WITH FULGERATION N/A 01/18/2018   Procedure: CYSTOSCOPY WITH FULGERATION/ BLADDER  BIOPSY;  Surgeon: Brooks Donnice, MD;  Location: [facility];  Service: Urology;  Laterality: N/A;   CYSTOSCOPY WITH INJECTION N/A 11/09/2018   Procedure: CYSTOSCOPY WITH INJECTION OF INDOCYANINE GREEN  DYE;  Surgeon: Alvaro Hummer, MD;  Location: [facility];  Service: Urology;  Laterality: N/A;   CYSTOSCOPY WITH INSERTION OF UROLIFT N/A 01/18/2018   Procedure: CYSTOSCOPY WITH INSERTION OF UROLIFT;  Surgeon: Brooks Donnice, MD;  Location: [facility];  Service: Urology;  Laterality: N/A;   CYSTOSCOPY/URETEROSCOPY/HOLMIUM LASER Left 02/17/2019   Procedure: URETEROSCOPY WITH BALLOON DILATION  AND NEPHROSTOGRAM;  Surgeon: Alvaro Hummer, MD;  Location: [facility];  Service: Urology;  Laterality: Left;  1 HR   EXCISION RIGHT  UPPER ARM LIPOMA  2005   FRACTURE SURGERY     HEMORROIDECTOMY     INGUINAL HERNIA REPAIR Left 1984   IR NEPHROSTOMY PLACEMENT LEFT  01/05/2019   JOINT REPLACEMENT     both  knees, and both shoulders joints replaced.   KNEE ARTHROSCOPY Left X3  LAST ONE  2002   ORIF LEFT HUMEROUS FX  1976   POLYPECTOMY  05/11/2018   Procedure: POLYPECTOMY;  Surgeon: Shaaron Lamar HERO, MD;  [facility];  Service: Endoscopy;;   PROSTATE SURGERY     RIGHT HEART CATH AND CORONARY ANGIOGRAPHY N/A 10/09/2022   Procedure: RIGHT HEART CATH AND CORONARY ANGIOGRAPHY;  Surgeon: Wendel Lurena POUR, MD;  [facility]: [facility];  Service: Cardiovascular;  Laterality: N/A;   ROBOT ASSISTED LAPAROSCOPIC NEPHRECTOMY Left 07/14/2019   Procedure: XI ROBOTIC ASSISTED LAPAROSCOPIC RETROPERITONEAL NEPHRECTOMY;  Surgeon: Alvaro Hummer, MD;  Location: [facility];  Service: Urology;  Laterality: Left;  3 HRS   SMALL INTESTINE SURGERY     THYROIDECTOMY Bilateral 04/14/2023   Procedure: TOTAL THYROIDECTOMY;  Surgeon: Jesus Oliphant, MD;  Location[facility];  Service: ENT;  Laterality: Bilateral;   TONSILLECTOMY  AS CHILD   TOTAL KNEE ARTHROPLASTY Left 2006    REVISION 2007  (AFTER I & D WITH ANTIBIOTIC SPACER PROCEDURE FOR STEPH INFECTION)   TOTAL KNEE ARTHROPLASTY Right 03/30/2016   Procedure: RIGHT TOTAL KNEE ARTHROPLASTY;  Surgeon: Donnice Car, MD;  Location: [facility];  Service: Orthopedics;  Laterality: Right;   TOTAL SHOULDER ARTHROPLASTY Right 04/06/2013   Procedure: RIGHT TOTAL SHOULDER ARTHROPLASTY;  Surgeon: Franky HERO Pointer, MD;  Location: [facility];  Service: Orthopedics;  Laterality: Right;   TOTAL SHOULDER ARTHROPLASTY Left 07/20/2013   Procedure: LEFT TOTAL SHOULDER ARTHROPLASTY;  Surgeon: Franky HERO Pointer, MD;  Location: [facility];  Service: Orthopedics;  Laterality: Left;   TRANSURETHRAL RESECTION OF BLADDER TUMOR N/A 11/12/2015   Procedure: TRANSURETHRAL RESECTION OF BLADDER TUMOR (TURBT);  Surgeon: Donnice Brooks, MD;  Location: [facility];  Service: Urology;  Laterality: N/A;   TRANSURETHRAL RESECTION OF BLADDER TUMOR WITH GYRUS (TURBT-GYRUS) N/A 12/05/2013   Procedure: TRANSURETHRAL RESECTION OF BLADDER TUMOR WITH GYRUS (TURBT-GYRUS);  Surgeon: Donnice Brooks, MD;  Location[facility];  Service: Urology;  Laterality: N/A;   Family History  Problem Relation Age of Onset   Alzheimer's disease Mother    Heart attack Father    Heart disease Father    Arthritis Father    Hearing loss Father    Hypertension Father    Irregular heart beat Brother        DEFIB.   Congestive Heart Failure Brother    Heart disease Brother    Diabetes Daughter    Sleep apnea Neg Hx     Current Outpatient Medications:    atorvastatin  (LIPITOR) 10 MG tablet, Take 1 tablet (10 mg total) by mouth daily., Disp: 90 tablet, Rfl: 3   azelastine  (ASTELIN ) 0.1 % nasal spray, Place 1 spray into both nostrils 2 (two) times daily. Use in each nostril as directed, Disp: 30 mL, Rfl: 12   furosemide  (LASIX ) 20 MG tablet, Take 1 tablet (20 mg total) by mouth See admin instructions. Take 1 tablet (20 mg) daily as needed if you gain more than  3 pounds in 1 day or 5 pounds in 1 week., Disp: 90 tablet, Rfl: 1   levothyroxine  (SYNTHROID ) 112 MCG tablet, Take 112 mcg by mouth every morning., Disp: , Rfl:  losartan  (COZAAR ) 25 MG tablet, Take 1 tablet (25 mg total) by mouth daily., Disp: 90 tablet, Rfl: 3   metoprolol  succinate (TOPROL -XL) 50 MG 24 hr tablet, TAKE 1 TABLET BY MOUTH EVERY DAY, Disp: 90 tablet, Rfl: 0   Multiple Vitamin (MULTIVITAMIN) capsule, Take 1 capsule by mouth daily., Disp: , Rfl:    polyvinyl alcohol  (LIQUIFILM TEARS) 1.4 % ophthalmic solution, Place 1 drop into both eyes as needed for dry eyes., Disp: 15 mL, Rfl: 0   spironolactone  (ALDACTONE ) 25 MG tablet, Take 12.5 mg by mouth daily., Disp: , Rfl:   Allergies  Allergen Reactions   Ciprofloxacin  Itching     ROS: Review of Systems A comprehensive review of systems was negative except for: Genitourinary: positive for urostomy    Physical exam BP 109/64   Pulse 60   Temp 98.9 F (37.2 C)   Ht 5' 6 (1.676 m)   Wt 218 lb (98.9 kg)   SpO2 96%   BMI 35.19 kg/m  General appearance: alert, cooperative, appears stated age, no distress, and moderately obese Head: Normocephalic, without obvious abnormality, atraumatic Eyes: negative findings: lids and lashes normal, conjunctivae and sclerae normal, corneas clear, and pupils equal, round, reactive to light and accomodation Ears: normal TM's and external ear canals both ears and wears hearing aids Nose: Nares normal. Septum midline. Mucosa normal. No drainage or sinus tenderness. Throat: lips, mucosa, and tongue normal; teeth and gums normal Neck: no adenopathy, no carotid bruit, supple, symmetrical, trachea midline, and thyroid  not enlarged, symmetric, no tenderness/mass/nodules Back: symmetric, no curvature. ROM normal. No CVA tenderness. Lungs: clear to auscultation bilaterally Chest wall: no tenderness Heart: regular rate and rhythm, S1, S2 normal, no murmur, click, rub or gallop Abdomen: soft,  non-tender; bowel sounds normal; no masses,  no organomegaly and urostomy in place Extremities: Trace ankle edema Pulses: 2+ and symmetric Skin: Few pigmented skin lesions appreciated Lymph nodes: Cervical, supraclavicular, and axillary nodes normal. Neurologic: Grossly normal except for difficulty with hearing and wears glasses     12/13/2023    2:59 PM 10/27/2023    3:36 PM 10/26/2023   12:30 PM  Depression screen PHQ 2/9  Decreased Interest 0 0 0  Down, Depressed, Hopeless 0 0 0  PHQ - 2 Score 0 0 0  Altered sleeping 0    Tired, decreased energy 0    Change in appetite 0    Feeling bad or failure about yourself  0    Trouble concentrating 0    Moving slowly or fidgety/restless 0    Suicidal thoughts 0    PHQ-9 Score 0        12/13/2023    2:59 PM 10/27/2023    3:37 PM 05/17/2023   12:02 PM 03/16/2023    9:34 AM  GAD 7 : Generalized Anxiety Score  Nervous, Anxious, on Edge 0 0 0 0  Control/stop worrying 0 0 0 0  Worry too much - different things 0 0 0 0  Trouble relaxing 0 0 0 0  Restless 0 0 0 0  Easily annoyed or irritable 0 0 0 0  Afraid - awful might happen 0 0 0 0  Total GAD 7 Score 0 0 0 0  Anxiety Difficulty Not difficult at all Not difficult at all  Not difficult at all     Assessment/ Plan: Billy Rasmussen here for annual physical exam.   Annual physical exam  CKD stage 3a, GFR 45-59 ml/min (HCC)  HFrEF (heart failure with  reduced ejection fraction) (HCC) - Plan: Lipid panel  Aneurysm of ascending aorta without rupture (HCC)  Multiple thyroid  nodules  Malignant neoplasm of urinary bladder, unspecified site (HCC)  History of urostomy  S/P prostatectomy - Plan: PSA  Future fasting labs ordered.  Continue to follow-up with nephrology for CKD, cardiology for heart failure, endocrinology for thyroid  issues and urology for ongoing management after bladder cancer status post urostomy and prostatectomy.  Future order for PSA ordered.  Counseled on  healthy lifestyle choices, including diet (rich in fruits, vegetables and lean meats and low in salt and simple carbohydrates) and exercise (at least 30 minutes of moderate physical activity daily).  Patient to follow up 1 year for CPE  Billy Rasmussen M. Jolinda, DO

## 2023-12-17 ENCOUNTER — Other Ambulatory Visit

## 2023-12-17 DIAGNOSIS — I5020 Unspecified systolic (congestive) heart failure: Secondary | ICD-10-CM

## 2023-12-17 DIAGNOSIS — R748 Abnormal levels of other serum enzymes: Secondary | ICD-10-CM | POA: Diagnosis not present

## 2023-12-17 DIAGNOSIS — Z9079 Acquired absence of other genital organ(s): Secondary | ICD-10-CM

## 2023-12-17 LAB — LIPID PANEL

## 2023-12-18 LAB — HEPATIC FUNCTION PANEL
ALT: 19 IU/L (ref 0–44)
AST: 37 IU/L (ref 0–40)
Albumin: 4.6 g/dL (ref 3.8–4.8)
Alkaline Phosphatase: 78 IU/L (ref 44–121)
Bilirubin Total: 0.7 mg/dL (ref 0.0–1.2)
Bilirubin, Direct: 0.25 mg/dL (ref 0.00–0.40)
Total Protein: 7.6 g/dL (ref 6.0–8.5)

## 2023-12-18 LAB — LIPID PANEL
Cholesterol, Total: 137 mg/dL (ref 100–199)
HDL: 26 mg/dL — AB (ref >39)
LDL CALC COMMENT:: 5.3 ratio — AB (ref 0.0–5.0)
LDL Chol Calc (NIH): 84 mg/dL (ref 0–99)
Triglycerides: 151 mg/dL — ABNORMAL HIGH (ref 0–149)
VLDL Cholesterol Cal: 27 mg/dL (ref 5–40)

## 2023-12-18 LAB — PSA: Prostate Specific Ag, Serum: <0.1 ng/mL (ref 0.0–4.0)

## 2023-12-20 ENCOUNTER — Ambulatory Visit: Payer: Self-pay | Admitting: Family Medicine

## 2023-12-24 ENCOUNTER — Other Ambulatory Visit: Payer: Self-pay

## 2023-12-24 ENCOUNTER — Emergency Department (HOSPITAL_COMMUNITY)

## 2023-12-24 ENCOUNTER — Encounter (HOSPITAL_COMMUNITY): Payer: Self-pay | Admitting: Emergency Medicine

## 2023-12-24 ENCOUNTER — Inpatient Hospital Stay (HOSPITAL_COMMUNITY)
Admission: EM | Admit: 2023-12-24 | Discharge: 2023-12-27 | DRG: 872 | Disposition: A | Discharge status: Home / Self Care | Attending: Internal Medicine | Admitting: Internal Medicine

## 2023-12-24 DIAGNOSIS — E669 Obesity, unspecified: Secondary | ICD-10-CM | POA: Diagnosis present

## 2023-12-24 DIAGNOSIS — Z87891 Personal history of nicotine dependence: Secondary | ICD-10-CM

## 2023-12-24 DIAGNOSIS — E890 Postprocedural hypothyroidism: Secondary | ICD-10-CM | POA: Diagnosis present

## 2023-12-24 DIAGNOSIS — Z820 Family history of epilepsy and other diseases of the nervous system: Secondary | ICD-10-CM | POA: Diagnosis not present

## 2023-12-24 DIAGNOSIS — M797 Fibromyalgia: Secondary | ICD-10-CM | POA: Diagnosis not present

## 2023-12-24 DIAGNOSIS — R509 Fever, unspecified: Secondary | ICD-10-CM | POA: Diagnosis not present

## 2023-12-24 DIAGNOSIS — E66812 Obesity, class 2: Secondary | ICD-10-CM | POA: Diagnosis present

## 2023-12-24 DIAGNOSIS — Z96653 Presence of artificial knee joint, bilateral: Secondary | ICD-10-CM | POA: Diagnosis present

## 2023-12-24 DIAGNOSIS — M5136 Other intervertebral disc degeneration, lumbar region with discogenic back pain only: Secondary | ICD-10-CM | POA: Diagnosis not present

## 2023-12-24 DIAGNOSIS — Z79899 Other long term (current) drug therapy: Secondary | ICD-10-CM

## 2023-12-24 DIAGNOSIS — Z1152 Encounter for screening for COVID-19: Secondary | ICD-10-CM | POA: Diagnosis not present

## 2023-12-24 DIAGNOSIS — E869 Volume depletion, unspecified: Secondary | ICD-10-CM | POA: Diagnosis not present

## 2023-12-24 DIAGNOSIS — Z6835 Body mass index (BMI) 35.0-35.9, adult: Secondary | ICD-10-CM

## 2023-12-24 DIAGNOSIS — E785 Hyperlipidemia, unspecified: Secondary | ICD-10-CM | POA: Diagnosis present

## 2023-12-24 DIAGNOSIS — K219 Gastro-esophageal reflux disease without esophagitis: Secondary | ICD-10-CM | POA: Diagnosis present

## 2023-12-24 DIAGNOSIS — Z833 Family history of diabetes mellitus: Secondary | ICD-10-CM

## 2023-12-24 DIAGNOSIS — I252 Old myocardial infarction: Secondary | ICD-10-CM | POA: Diagnosis not present

## 2023-12-24 DIAGNOSIS — A419 Sepsis, unspecified organism: Secondary | ICD-10-CM | POA: Diagnosis not present

## 2023-12-24 DIAGNOSIS — D696 Thrombocytopenia, unspecified: Secondary | ICD-10-CM | POA: Diagnosis present

## 2023-12-24 DIAGNOSIS — K8020 Calculus of gallbladder without cholecystitis without obstruction: Secondary | ICD-10-CM | POA: Diagnosis not present

## 2023-12-24 DIAGNOSIS — Z85850 Personal history of malignant neoplasm of thyroid: Secondary | ICD-10-CM

## 2023-12-24 DIAGNOSIS — I10 Essential (primary) hypertension: Secondary | ICD-10-CM | POA: Diagnosis present

## 2023-12-24 DIAGNOSIS — R6510 Systemic inflammatory response syndrome (SIRS) of non-infectious origin without acute organ dysfunction: Secondary | ICD-10-CM | POA: Diagnosis not present

## 2023-12-24 DIAGNOSIS — I5022 Chronic systolic (congestive) heart failure: Secondary | ICD-10-CM | POA: Diagnosis not present

## 2023-12-24 DIAGNOSIS — N390 Urinary tract infection, site not specified: Secondary | ICD-10-CM | POA: Diagnosis present

## 2023-12-24 DIAGNOSIS — Z96612 Presence of left artificial shoulder joint: Secondary | ICD-10-CM | POA: Diagnosis present

## 2023-12-24 DIAGNOSIS — Z822 Family history of deafness and hearing loss: Secondary | ICD-10-CM

## 2023-12-24 DIAGNOSIS — I5020 Unspecified systolic (congestive) heart failure: Secondary | ICD-10-CM | POA: Diagnosis not present

## 2023-12-24 DIAGNOSIS — E871 Hypo-osmolality and hyponatremia: Secondary | ICD-10-CM | POA: Diagnosis present

## 2023-12-24 DIAGNOSIS — Z8249 Family history of ischemic heart disease and other diseases of the circulatory system: Secondary | ICD-10-CM

## 2023-12-24 DIAGNOSIS — I7121 Aneurysm of the ascending aorta, without rupture: Secondary | ICD-10-CM | POA: Diagnosis not present

## 2023-12-24 DIAGNOSIS — N1831 Chronic kidney disease, stage 3a: Secondary | ICD-10-CM | POA: Diagnosis not present

## 2023-12-24 DIAGNOSIS — Z96611 Presence of right artificial shoulder joint: Secondary | ICD-10-CM | POA: Diagnosis present

## 2023-12-24 DIAGNOSIS — Z79890 Hormone replacement therapy (postmenopausal): Secondary | ICD-10-CM | POA: Diagnosis not present

## 2023-12-24 DIAGNOSIS — I130 Hypertensive heart and chronic kidney disease with heart failure and stage 1 through stage 4 chronic kidney disease, or unspecified chronic kidney disease: Secondary | ICD-10-CM | POA: Diagnosis not present

## 2023-12-24 DIAGNOSIS — Z8261 Family history of arthritis: Secondary | ICD-10-CM

## 2023-12-24 DIAGNOSIS — Z95810 Presence of automatic (implantable) cardiac defibrillator: Secondary | ICD-10-CM | POA: Diagnosis not present

## 2023-12-24 DIAGNOSIS — I2510 Atherosclerotic heart disease of native coronary artery without angina pectoris: Secondary | ICD-10-CM | POA: Diagnosis not present

## 2023-12-24 DIAGNOSIS — R0689 Other abnormalities of breathing: Secondary | ICD-10-CM | POA: Diagnosis not present

## 2023-12-24 DIAGNOSIS — C679 Malignant neoplasm of bladder, unspecified: Secondary | ICD-10-CM | POA: Diagnosis present

## 2023-12-24 DIAGNOSIS — M47814 Spondylosis without myelopathy or radiculopathy, thoracic region: Secondary | ICD-10-CM | POA: Diagnosis not present

## 2023-12-24 DIAGNOSIS — M47816 Spondylosis without myelopathy or radiculopathy, lumbar region: Secondary | ICD-10-CM | POA: Diagnosis not present

## 2023-12-24 DIAGNOSIS — Z881 Allergy status to other antibiotic agents status: Secondary | ICD-10-CM

## 2023-12-24 DIAGNOSIS — I517 Cardiomegaly: Secondary | ICD-10-CM | POA: Diagnosis not present

## 2023-12-24 DIAGNOSIS — C750 Malignant neoplasm of parathyroid gland: Secondary | ICD-10-CM | POA: Diagnosis not present

## 2023-12-24 DIAGNOSIS — Z8551 Personal history of malignant neoplasm of bladder: Secondary | ICD-10-CM

## 2023-12-24 DIAGNOSIS — Z905 Acquired absence of kidney: Secondary | ICD-10-CM

## 2023-12-24 DIAGNOSIS — R6889 Other general symptoms and signs: Secondary | ICD-10-CM | POA: Diagnosis not present

## 2023-12-24 DIAGNOSIS — G4733 Obstructive sleep apnea (adult) (pediatric): Secondary | ICD-10-CM | POA: Diagnosis present

## 2023-12-24 LAB — COMPREHENSIVE METABOLIC PANEL WITH GFR
ALT: 18 U/L (ref 0–44)
AST: 34 U/L (ref 15–41)
Albumin: 4.2 g/dL (ref 3.5–5.0)
Alkaline Phosphatase: 58 U/L (ref 38–126)
Anion gap: 14 (ref 5–15)
BUN: 29 mg/dL — ABNORMAL HIGH (ref 8–23)
CO2: 19 mmol/L — ABNORMAL LOW (ref 22–32)
Calcium: 9 mg/dL (ref 8.9–10.3)
Chloride: 101 mmol/L (ref 98–111)
Creatinine, Ser: 1.61 mg/dL — ABNORMAL HIGH (ref 0.61–1.24)
GFR, Estimated: 44 mL/min — ABNORMAL LOW (ref >60)
Glucose, Bld: 120 mg/dL — ABNORMAL HIGH (ref 70–99)
Potassium: 3.9 mmol/L (ref 3.5–5.1)
Sodium: 134 mmol/L — ABNORMAL LOW (ref 135–145)
Total Bilirubin: 1.5 mg/dL — ABNORMAL HIGH (ref 0.0–1.2)
Total Protein: 8 g/dL (ref 6.5–8.1)

## 2023-12-24 LAB — CBC WITH DIFFERENTIAL/PLATELET
Abs Immature Granulocytes: 0.08 K/uL — ABNORMAL HIGH (ref 0.00–0.07)
Basophils Absolute: 0 K/uL (ref 0.0–0.1)
Basophils Relative: 0 %
Eosinophils Absolute: 0 K/uL (ref 0.0–0.5)
Eosinophils Relative: 0 %
HCT: 38.4 % — ABNORMAL LOW (ref 39.0–52.0)
Hemoglobin: 13.4 g/dL (ref 13.0–17.0)
Immature Granulocytes: 1 %
Lymphocytes Relative: 4 %
Lymphs Abs: 0.5 K/uL — ABNORMAL LOW (ref 0.7–4.0)
MCH: 33 pg (ref 26.0–34.0)
MCHC: 34.9 g/dL (ref 30.0–36.0)
MCV: 94.6 fL (ref 80.0–100.0)
Monocytes Absolute: 0.9 K/uL (ref 0.1–1.0)
Monocytes Relative: 7 %
Neutro Abs: 11.1 K/uL — ABNORMAL HIGH (ref 1.7–7.7)
Neutrophils Relative %: 88 %
Platelets: 116 K/uL — ABNORMAL LOW (ref 150–400)
RBC: 4.06 MIL/uL — ABNORMAL LOW (ref 4.22–5.81)
RDW: 13.9 % (ref 11.5–15.5)
WBC: 12.6 K/uL — ABNORMAL HIGH (ref 4.0–10.5)
nRBC: 0 % (ref 0.0–0.2)

## 2023-12-24 LAB — RESP PANEL BY RT-PCR (RSV, FLU A&B, COVID)  RVPGX2
Influenza A by PCR: NEGATIVE
Influenza B by PCR: NEGATIVE
Resp Syncytial Virus by PCR: NEGATIVE
SARS Coronavirus 2 by RT PCR: NEGATIVE

## 2023-12-24 LAB — BLOOD GAS, VENOUS
Acid-base deficit: 0.1 mmol/L (ref 0.0–2.0)
Bicarbonate: 23.9 mmol/L (ref 20.0–28.0)
Drawn by: 8368
O2 Saturation: 84.3 %
Patient temperature: 39.1
pCO2, Ven: 39 mmHg — ABNORMAL LOW (ref 44–60)
pH, Ven: 7.4 (ref 7.25–7.43)
pO2, Ven: 57 mmHg — ABNORMAL HIGH (ref 32–45)

## 2023-12-24 LAB — URINALYSIS, W/ REFLEX TO CULTURE (INFECTION SUSPECTED)
Bacteria, UA: NONE SEEN
Bilirubin Urine: NEGATIVE
Glucose, UA: NEGATIVE mg/dL
Ketones, ur: NEGATIVE mg/dL
Leukocytes,Ua: NEGATIVE
Nitrite: NEGATIVE
Protein, ur: 30 mg/dL — AB
RBC / HPF: >50 RBC/hpf (ref 0–5)
Specific Gravity, Urine: 1.015 (ref 1.005–1.030)
pH: 6 (ref 5.0–8.0)

## 2023-12-24 LAB — PROTIME-INR
INR: 1.2 (ref 0.8–1.2)
Prothrombin Time: 16.3 s — ABNORMAL HIGH (ref 11.4–15.2)

## 2023-12-24 LAB — LACTIC ACID, PLASMA
Lactic Acid, Venous: 1.1 mmol/L (ref 0.5–1.9)
Lactic Acid, Venous: 1.1 mmol/L (ref 0.5–1.9)

## 2023-12-24 LAB — BRAIN NATRIURETIC PEPTIDE: B Natriuretic Peptide: 187 pg/mL — ABNORMAL HIGH (ref 0.0–100.0)

## 2023-12-24 MED ORDER — LEVOTHYROXINE SODIUM 112 MCG PO TABS
112.0000 ug | ORAL_TABLET | Freq: Every morning | ORAL | Status: DC
  Administered 2023-12-25 – 2023-12-27 (×3): 112 ug via ORAL
  Filled 2023-12-24 (×3): qty 1

## 2023-12-24 MED ORDER — HEPARIN SODIUM (PORCINE) 5000 UNIT/ML IJ SOLN
5000.0000 [IU] | Freq: Three times a day (TID) | INTRAMUSCULAR | Status: DC
  Administered 2023-12-24 – 2023-12-27 (×8): 5000 [IU] via SUBCUTANEOUS
  Filled 2023-12-24 (×8): qty 1

## 2023-12-24 MED ORDER — VANCOMYCIN HCL 2000 MG/400ML IV SOLN
2000.0000 mg | Freq: Once | INTRAVENOUS | Status: AC
  Administered 2023-12-24: 2000 mg via INTRAVENOUS
  Filled 2023-12-24: qty 400

## 2023-12-24 MED ORDER — ACETAMINOPHEN 325 MG PO TABS
650.0000 mg | ORAL_TABLET | Freq: Once | ORAL | Status: AC
  Administered 2023-12-24: 650 mg via ORAL
  Filled 2023-12-24: qty 2

## 2023-12-24 MED ORDER — ONDANSETRON HCL 4 MG/2ML IJ SOLN: 4.0000 mg | Freq: Four times a day (QID) | INTRAMUSCULAR | Status: DC | PRN

## 2023-12-24 MED ORDER — IOHEXOL 300 MG/ML  SOLN
75.0000 mL | Freq: Once | INTRAMUSCULAR | Status: AC | PRN
  Administered 2023-12-24: 75 mL via INTRAVENOUS

## 2023-12-24 MED ORDER — ACETAMINOPHEN 325 MG PO TABS
650.0000 mg | ORAL_TABLET | Freq: Four times a day (QID) | ORAL | Status: DC | PRN
  Administered 2023-12-24 – 2023-12-26 (×4): 650 mg via ORAL
  Filled 2023-12-24 (×4): qty 2

## 2023-12-24 MED ORDER — LACTATED RINGERS IV SOLN: INTRAVENOUS | Status: AC

## 2023-12-24 MED ORDER — ADULT MULTIVITAMIN W/MINERALS CH
1.0000 | ORAL_TABLET | Freq: Every day | ORAL | Status: DC
  Administered 2023-12-24 – 2023-12-26 (×3): 1 via ORAL
  Filled 2023-12-24 (×3): qty 1

## 2023-12-24 MED ORDER — ACETAMINOPHEN 650 MG RE SUPP: 650.0000 mg | Freq: Four times a day (QID) | RECTAL | Status: DC | PRN

## 2023-12-24 MED ORDER — SODIUM CHLORIDE 0.9 % IV SOLN
2.0000 g | Freq: Once | INTRAVENOUS | Status: AC
  Administered 2023-12-24: 2 g via INTRAVENOUS
  Filled 2023-12-24: qty 12.5

## 2023-12-24 MED ORDER — METRONIDAZOLE 500 MG/100ML IV SOLN
500.0000 mg | Freq: Once | INTRAVENOUS | Status: AC
  Administered 2023-12-24: 500 mg via INTRAVENOUS
  Filled 2023-12-24: qty 100

## 2023-12-24 MED ORDER — SODIUM CHLORIDE 0.9 % IV SOLN
1.0000 g | Freq: Two times a day (BID) | INTRAVENOUS | Status: DC
  Administered 2023-12-24 – 2023-12-27 (×6): 1 g via INTRAVENOUS
  Filled 2023-12-24 (×6): qty 20

## 2023-12-24 MED ORDER — VANCOMYCIN HCL IN DEXTROSE 1-5 GM/200ML-% IV SOLN: 1000.0000 mg | Freq: Once | INTRAVENOUS | Status: DC

## 2023-12-24 MED ORDER — METOPROLOL SUCCINATE ER 25 MG PO TB24
25.0000 mg | ORAL_TABLET | Freq: Every day | ORAL | Status: DC
  Administered 2023-12-24 – 2023-12-26 (×3): 25 mg via ORAL
  Filled 2023-12-24 (×3): qty 1

## 2023-12-24 MED ORDER — ONDANSETRON HCL 4 MG PO TABS: 4.0000 mg | ORAL_TABLET | Freq: Four times a day (QID) | ORAL | Status: DC | PRN

## 2023-12-24 NOTE — Hospital Course (Addendum)
 78 year old male with a history of with history of bladder cancer status post cystoprostatectomy with ileal conduit 10/2018, left nephrectomy (07/2019), HFrEF (09/2022, LVEF was <20% ) s/p CRT-D (01/2023), nonobstructive CAD LBBB, prior LV thrombus, pericarditis 05/2023, hypertension, HLD, CKD stage IIIa, thyroid  cancer status post thyroidectomy (04/2023) presenting with fever and confusion and generalized weakness with associated fevers and chills.   Notably, the patient has had numerous similar hospitalizations in the past. Most recently, he was admitted from 08/21/2023 to 08/26/2023 with fever and leukopenia with concerns of pyelonephritis.  At that time, there was also concern for tickborne infection.  He was discharged home with doxycycline .  He was readmitted to the hospital from 10/21/2023 to 10/25/2023.  During that hospitalization, he was treated for severe sepsis.  His urine culture grew out Pseudomonas and pantoea.  He was discharged with 7 more days of ciprofloxacin .  The patient had been in his usual state of health until he developed some chills around 10 PM on the evening of 12/23/2023.  There has been no recent complaints of headache, chest pain, dyspnea, nausea, vomiting, diarrhea, abdominal pain, dysuria, hematuria, hematochezia, and melena. The wife states that she noticed that the urine from his ileal conduit was yellowish in color which is not normal for him. On the morning of 12/24/2023, the patient was somnolent but arousable.  He had generalized weakness.  Wife took his temperature and it was 76 F.  EMS was activated.  In the ED, the patient was febrile up to 102.3 F.  He was tachycardic 110s, and hemodynamically stable.  Oxygen  saturation is 95-98% on room air.  WBC 12.6, hemoglobin 13.4, platelet 116.  Sodium 134, potassium 3.9, bicarbonate 19, serum creatinine 1.61. He gradually improved and defervesced.  His antibiotics were narrowed.  Although, his cultures remained neg, his working  diagnosis was urinary source sepsis given his complicated urologic history.

## 2023-12-24 NOTE — Progress Notes (Signed)
   12/24/23 2050  Vitals  Temp (!) 103 F (39.4 C)  Temp Source Oral  BP (!) 145/86  MAP (mmHg) 104  BP Method Automatic  Patient Position (if appropriate) Lying  Pulse Rate (!) 109  Pulse Rate Source Monitor  Resp 20  MEWS COLOR  MEWS Score Color Yellow  Oxygen  Therapy  SpO2 100 %  O2 Device Room Air  MEWS Score  MEWS Temp 2  MEWS Systolic 0  MEWS Pulse 1  MEWS RR 0  MEWS LOC 0  MEWS Score 3  Provider Notification  Provider Name/Title Dorrell MD  Date Provider Notified 12/24/23  Time Provider Notified 2057  Method of Notification  (Secure chat)  Notification Reason Change in status;Other (Comment) (Yellow mews, Temp 103.)    MD Dorrell made aware of patient changing to a yellow MEWS. Tylenol  given for 103 temp. Will recheck in an hour.

## 2023-12-24 NOTE — ED Notes (Signed)
 Two person skin check with PA Hildegard.

## 2023-12-24 NOTE — ED Notes (Signed)
 Wife informed this nurse that patient complaining of being cold. Temp taken orally 100.7.  antibiotics started per MD order. MD notified of temp.

## 2023-12-24 NOTE — ED Notes (Signed)
 Pt to CT

## 2023-12-24 NOTE — ED Provider Notes (Signed)
 [place] [facility] AT [facility] Provider Note   CSN: [id number] Arrival date & time: 12/24/23  9083     Patient presents with: Code Sepsis   Billy Rasmussen is a 78 y.o. male.   78 year old male presents today for concern of urinary tract infection and sepsis.  He states he gets urinary tract infections months ago and frequently.  Endorses fever, history of solitary kidney.  He is somewhat somnolent but alert and oriented.  No family at bedside at this moment.  He denies cough, shortness of breath, chest pain, abdominal pain, nausea, vomiting.  The history is provided by the patient. No language interpreter was used.       Prior to Admission medications   Medication Sig Start Date End Date Taking? Authorizing Provider  azelastine  (ASTELIN ) 0.1 % nasal spray Place 1 spray into both nostrils 2 (two) times daily. Use in each nostril as directed 08/18/23   Deitra Morton Sebastian Nena, NP  furosemide  (LASIX ) 20 MG tablet Take 1 tablet (20 mg total) by mouth See admin instructions. Take 1 tablet (20 mg) daily as needed if you gain more than 3 pounds in 1 day or 5 pounds in 1 week. 10/26/23   [name], [name]  levothyroxine  (SYNTHROID ) 112 MCG tablet Take 112 mcg by mouth every morning.    [provider]  losartan  (COZAAR ) 25 MG tablet Take 1 tablet (25 mg total) by mouth daily. 07/12/23 12/13/23  Kate Lonni CROME, MD  metoprolol  succinate (TOPROL -XL) 50 MG 24 hr tablet TAKE 1 TABLET BY MOUTH EVERY DAY 12/08/23   Jolinda Potter M, DO  Multiple Vitamin (MULTIVITAMIN) capsule Take 1 capsule by mouth daily.    [provider]  polyvinyl alcohol  (LIQUIFILM TEARS) 1.4 % ophthalmic solution Place 1 drop into both eyes as needed for dry eyes. 05/16/19   Sheth, Marliss SQUIBB, MD  spironolactone  (ALDACTONE ) 25 MG tablet Take 12.5 mg by mouth daily. 05/18/23   [provider]    Allergies: Ciprofloxacin     Review of Systems  Constitutional:   Positive for chills and fever.  Respiratory:  Negative for cough and shortness of breath.   Cardiovascular:  Negative for chest pain.  Gastrointestinal:  Negative for abdominal pain, nausea and vomiting.  Neurological:  Negative for light-headedness.  All other systems reviewed and are negative.   Updated Vital Signs BP 139/70   Pulse (!) 101   Temp (!) 102.3 F (39.1 C) (Oral)   Resp (!) 26   Ht 5' 6 (1.676 m)   Wt 98.9 kg   SpO2 92%   BMI 35.19 kg/m   Physical Exam Vitals and nursing note reviewed.  Constitutional:      General: He is not in acute distress.    Appearance: Normal appearance. He is not ill-appearing.  HENT:     Head: Normocephalic and atraumatic.     Nose: Nose normal.  Eyes:     Conjunctiva/sclera: Conjunctivae normal.  Cardiovascular:     Rate and Rhythm: Regular rhythm. Tachycardia present.  Pulmonary:     Effort: Pulmonary effort is normal. No respiratory distress.  Abdominal:     General: There is no distension.     Palpations: Abdomen is soft.     Tenderness: There is no abdominal tenderness. There is no guarding.     Comments: Urostomy site noted on the right side of the abdomen with urine in bag.  Musculoskeletal:        General: No  deformity. Normal range of motion.     Cervical back: Normal range of motion.  Skin:    Findings: No rash.  Neurological:     Mental Status: He is alert.     (all labs ordered are listed, but only abnormal results are displayed) Labs Reviewed  COMPREHENSIVE METABOLIC PANEL WITH GFR - Abnormal; Notable for the following components:      Result Value   Sodium 134 (*)    CO2 19 (*)    Glucose, Bld 120 (*)    BUN 29 (*)    Creatinine, Ser 1.61 (*)    Total Bilirubin 1.5 (*)    GFR, Estimated 44 (*)    All other components within normal limits  CBC WITH DIFFERENTIAL/PLATELET - Abnormal; Notable for the following components:   WBC 12.6 (*)    RBC 4.06 (*)    HCT 38.4 (*)    Platelets 116 (*)    Neutro  Abs 11.1 (*)    Lymphs Abs 0.5 (*)    Abs Immature Granulocytes 0.08 (*)    All other components within normal limits  PROTIME-INR - Abnormal; Notable for the following components:   Prothrombin Time 16.3 (*)    All other components within normal limits  BRAIN NATRIURETIC PEPTIDE - Abnormal; Notable for the following components:   B Natriuretic Peptide 187.0 (*)    All other components within normal limits  BLOOD GAS, VENOUS - Abnormal; Notable for the following components:   pCO2, Ven 39 (*)    pO2, Ven 57 (*)    All other components within normal limits  RESP PANEL BY RT-PCR (RSV, FLU A&B, COVID)  RVPGX2  CULTURE, BLOOD (ROUTINE X 2)  CULTURE, BLOOD (ROUTINE X 2)  LACTIC ACID, PLASMA  LACTIC ACID, PLASMA  URINALYSIS, W/ REFLEX TO CULTURE (INFECTION SUSPECTED)    EKG: None  Radiology: DG Chest Port 1 View if patient is in a treatment room. Result Date: 12/24/2023 CLINICAL DATA:  Sepsis.  Fever. EXAM: PORTABLE CHEST 1 VIEW COMPARISON:  December 10, 2023. FINDINGS: Stable cardiomegaly. Left-sided defibrillator is unchanged. Lungs are clear. Bilateral shoulder arthroplasties. IMPRESSION: No active disease. Electronically Signed   By: Lynwood Landy Raddle M.D.   On: 12/24/2023 09:54     .Critical Care  Performed by: Hildegard Loge, PA-C Authorized by: Hildegard Loge, PA-C   Critical care provider statement:    Critical care time (minutes):  30   Critical care was necessary to treat or prevent imminent or life-threatening deterioration of the following conditions:  Sepsis   Critical care was time spent personally by me on the following activities:  Development of treatment plan with patient or surrogate, discussions with consultants, evaluation of patient's response to treatment, examination of patient, ordering and review of laboratory studies, ordering and review of radiographic studies, ordering and performing treatments and interventions, pulse oximetry, re-evaluation of patient's condition and  review of old charts   Care discussed with: admitting provider      Medications Ordered in the ED  acetaminophen  (TYLENOL ) tablet 650 mg (650 mg Oral Given 12/24/23 0955)  ceFEPIme  (MAXIPIME ) 2 g in sodium chloride  0.9 % 100 mL IVPB (2 g Intravenous New Bag/Given 12/24/23 0953)                                    Medical Decision Making Amount and/or Complexity of Data Reviewed Labs: ordered. Radiology: ordered.  Risk  OTC drugs. Prescription drug management. Decision regarding hospitalization.   Medical Decision Making / ED Course   This patient presents to the ED for concern of fever, this involves an extensive number of treatment options, and is a complaint that carries with it a high risk of complications and morbidity.  The differential diagnosis includes sepsis, UTI, pneumonia, viral URI  MDM: 78 year old male presents today for concern of potential sepsis.  He developed a fever this morning and has been weaker and more somnolent than usual.  Wife is at bedside and provides some history.  Patient is alert and oriented.  He is concerned for urinary tract infection.  He states this is similar to how he gets with a urinary tract infection.  CBC with mild leukocytosis with mild left shift.  No anemia.  CMP with creatinine of 1.61 otherwise without acute concern.  BNP of 187, normal lactic acid, UA with 6-10 WBCs otherwise without acute concern.  VBG without acute concern.  Respiratory panel negative.  Chest x-ray without pneumonia or other acute concern.  CT chest abdomen pelvis with contrast obtained due to no obvious source identified.  This also did not show any acute finding. Discussed with hospitalist.  They will admit patient for SIRS positive and concern for sepsis.  Broad-spectrum antibiotics being given.  Will hold off on fluids given his severe history of CHF with EF of 25-30%.  Lab Tests: -I ordered, reviewed, and interpreted labs.   The pertinent results include:   Labs  Reviewed  COMPREHENSIVE METABOLIC PANEL WITH GFR - Abnormal; Notable for the following components:      Result Value   Sodium 134 (*)    CO2 19 (*)    Glucose, Bld 120 (*)    BUN 29 (*)    Creatinine, Ser 1.61 (*)    Total Bilirubin 1.5 (*)    GFR, Estimated 44 (*)    All other components within normal limits  CBC WITH DIFFERENTIAL/PLATELET - Abnormal; Notable for the following components:   WBC 12.6 (*)    RBC 4.06 (*)    HCT 38.4 (*)    Platelets 116 (*)    Neutro Abs 11.1 (*)    Lymphs Abs 0.5 (*)    Abs Immature Granulocytes 0.08 (*)    All other components within normal limits  PROTIME-INR - Abnormal; Notable for the following components:   Prothrombin Time 16.3 (*)    All other components within normal limits  URINALYSIS, W/ REFLEX TO CULTURE (INFECTION SUSPECTED) - Abnormal; Notable for the following components:   Hgb urine dipstick LARGE (*)    Protein, ur 30 (*)    All other components within normal limits  BRAIN NATRIURETIC PEPTIDE - Abnormal; Notable for the following components:   B Natriuretic Peptide 187.0 (*)    All other components within normal limits  BLOOD GAS, VENOUS - Abnormal; Notable for the following components:   pCO2, Ven 39 (*)    pO2, Ven 57 (*)    All other components within normal limits  RESP PANEL BY RT-PCR (RSV, FLU A&B, COVID)  RVPGX2  CULTURE, BLOOD (ROUTINE X 2)  CULTURE, BLOOD (ROUTINE X 2)  URINE CULTURE  LACTIC ACID, PLASMA  LACTIC ACID, PLASMA      EKG  EKG Interpretation Date/Time:    Ventricular Rate:    PR Interval:    QRS Duration:    QT Interval:    QTC Calculation:   R Axis:      Text  Interpretation:           Imaging Studies ordered: I ordered imaging studies including CT chest abdomen pelvis with contrast, chest x-ray I independently visualized and interpreted imaging. I agree with the radiologist interpretation   Medicines ordered and prescription drug management: Meds ordered this encounter   Medications   acetaminophen  (TYLENOL ) tablet 650 mg   ceFEPIme  (MAXIPIME ) 2 g in sodium chloride  0.9 % 100 mL IVPB    Antibiotic Indication::   UTI   metroNIDAZOLE  (FLAGYL ) IVPB 500 mg    Antibiotic Indication::   Other Indication (list below)    Other Indication::   Unknown Source.   DISCONTD: vancomycin  (VANCOCIN ) IVPB 1000 mg/200 mL premix    Indication::   Other Indication (list below)    Other Indication::   Unknown Source.   vancomycin  (VANCOREADY) IVPB 2000 mg/400 mL    Indication::   Other Indication (list below)    Other Indication::   Unknown Source.   acetaminophen  (TYLENOL ) tablet 650 mg   iohexol  (OMNIPAQUE ) 300 MG/ML solution 75 mL   meropenem  (MERREM ) 1 g in sodium chloride  0.9 % 100 mL IVPB    Antibiotic Indication::   Other Indication (list below)    Other Indication::   hx ESBL    -I have reviewed the patients home medicines and have made adjustments as needed  Critical interventions Sepsis protocol, broad-spectrum IV antibiotics   Reevaluation: After the interventions noted above, I reevaluated the patient and found that they have :stayed the same  Co morbidities that complicate the patient evaluation  Past Medical History:  Diagnosis Date   AICD (automatic cardioverter/defibrillator) present    [employer]   Anemia aprox. may-1-24   Arthritis    knees, shoulders, elbows   Bladder cancer [facility])    urologist-  dr nieves   CHF (congestive heart failure) (HCC)    Chronic kidney disease    left kidney removed   Diverticulosis of colon    Fibromyalgia    GERD (gastroesophageal reflux disease)    History of diverticulitis of colon    History of gastric ulcer    due to aleve   Hypertension    Lower urinary tract symptoms (LUTS)    Myocardial infarction (HCC) may 1-24   OSA (obstructive sleep apnea)    Wears CPAP nightly   Oxygen  deficiency    Pericarditis 05/17/2023   PONV (postoperative nausea and vomiting)    Psoriasis    Sepsis (HCC)     january 2021   Sleep apnea    Thyroid  disease    biopsy on 10-08-22   Tinnitus    right ear more, has tranmitter in right ear removable at hs   Ulcer    Wears glasses    Wears partial dentures       Dispostion: Discussed with hospitalist.  They will evaluate patient for admission     Final diagnoses:  None    ED Discharge Orders     None          Hildegard Loge, PA-C 12/24/23 1640    Elnor Jayson LABOR, DO 12/25/23 1616

## 2023-12-24 NOTE — ED Triage Notes (Signed)
 Pt to the ED via [facility] from home with reports of sepsis. Pt had a oral temp of 102.8 and CBG of 110 with EMS.  Pt has a history of one kidney, AAA, and cannot take tylenol  or other NSAIDS.  Pt responds to commands with EMS but is hearing impaired. Patient also has a pacemaker.

## 2023-12-24 NOTE — Sepsis Progress Note (Signed)
 eLink is following this Code Sepsis.

## 2023-12-24 NOTE — ED Notes (Signed)
 Patient transported to CT

## 2023-12-24 NOTE — ED Notes (Signed)
 Urostomy bag REF [id number].

## 2023-12-24 NOTE — H&P (Signed)
 History and Physical    Patient: Billy Rasmussen FMW:[id number] DOB: 1945-07-21 DOA: 12/24/2023 DOS: the patient was seen and examined on 12/24/2023 PCP: [name], DO  Patient coming from: Home  Chief Complaint:  Chief Complaint  Patient presents with   Code Sepsis   HPI: Billy Rasmussen is a 78 year old male with a history of with history of bladder cancer status post cystoprostatectomy with ileal conduit 10/2018, left nephrectomy (07/2019), HFrEF (09/2022, LVEF was <20% ) s/p CRT-D (01/2023), nonobstructive CAD LBBB, prior LV thrombus, pericarditis 05/2023, hypertension, HLD, CKD stage IIIa, thyroid  cancer status post thyroidectomy (04/2023) presenting with fever and confusion and generalized weakness with associated fevers and chills.   Notably, the patient has had numerous similar hospitalizations in the past. Most recently, he was admitted from 08/21/2023 to 08/26/2023 with fever and leukopenia with concerns of pyelonephritis.  At that time, there was also concern for tickborne infection.  He was discharged home with doxycycline .  He was readmitted to the hospital from 10/21/2023 to 10/25/2023.  During that hospitalization, he was treated for severe sepsis.  His urine culture grew out Pseudomonas and pantoea.  He was discharged with 7 more days of ciprofloxacin .  The patient had been in his usual state of health until he developed some chills around 10 PM on the evening of 12/23/2023.  There has been no recent complaints of headache, chest pain, dyspnea, nausea, vomiting, diarrhea, abdominal pain, dysuria, hematuria, hematochezia, and melena. The wife states that she noticed that the urine from his ileal conduit was yellowish in color which is not normal for him. On the morning of 12/24/2023, the patient was somnolent but arousable.  He had generalized weakness.  Wife took his temperature and it was 53 F.  EMS was activated.  In the ED, the patient was febrile up to 1 2.3 F.  He was  tachycardic 110s, and hemodynamically stable.  Oxygen  saturation is 95-98% on room air.  WBC 12.6, hemoglobin 13.4, platelet 116.  Sodium 134, potassium 3.9, bicarbonate 19, serum creatinine 1.61.     Review of Systems: As mentioned in the history of present illness. All other systems reviewed and are negative. Past Medical History:  Diagnosis Date   AICD (automatic cardioverter/defibrillator) present    [employer]   Anemia aprox. may-1-24   Arthritis    knees, shoulders, elbows   Bladder cancer [facility])    urologist-  dr nieves   CHF (congestive heart failure) (HCC)    Chronic kidney disease    left kidney removed   Diverticulosis of colon    Fibromyalgia    GERD (gastroesophageal reflux disease)    History of diverticulitis of colon    History of gastric ulcer    due to aleve   Hypertension    Lower urinary tract symptoms (LUTS)    Myocardial infarction (HCC) may 1-24   OSA (obstructive sleep apnea)    Wears CPAP nightly   Oxygen  deficiency    Pericarditis 05/17/2023   PONV (postoperative nausea and vomiting)    Psoriasis    Sepsis (HCC)    january 2021   Sleep apnea    Thyroid  disease    biopsy on 10-08-22   Tinnitus    right ear more, has tranmitter in right ear removable at hs   Ulcer    Wears glasses    Wears partial dentures    Past Surgical History:  Procedure Laterality Date   APPENDECTOMY     BIV ICD INSERTION  CRT-D N/A 03/01/2023   Procedure: BIV ICD INSERTION CRT-D;  Surgeon: Mealor, Eulas BRAVO, MD;  Location: [facility] INVASIVE CV LAB;  Service: Cardiovascular;  Laterality: N/A;   COLECTOMY W/ COLOSTOMY  1996   W/   APPENDECTOMY   COLONOSCOPY N/A 05/11/2018   Procedure: COLONOSCOPY;  Surgeon: Shaaron Lamar HERO, MD;  Location: [facility];  Service: Endoscopy;  Laterality: N/A;  9:30   COLOSTOMY TAKEDOWN  1996   CYSTOSCOPY WITH BIOPSY N/A 12/05/2013   Procedure: CYSTO BLADDER BIOPSY AND FULGERATION;  Surgeon: Donnice Brooks, MD;  Location: [facility];  Service: Urology;  Laterality: N/A;   CYSTOSCOPY WITH BIOPSY Bilateral 11/13/2014   Procedure: CYSTOSCOPY WITH  BLADDER BIOPSY FULGERATION AND BILATERAL RETROGRADE PYELOGRAMS;  Surgeon: Donnice Brooks, MD;  Location: [facility];  Service: Urology;  Laterality: Bilateral;   CYSTOSCOPY WITH FULGERATION N/A 01/18/2018   Procedure: CYSTOSCOPY WITH FULGERATION/ BLADDER BIOPSY;  Surgeon: Brooks Donnice, MD;  Location: [facility];  Service: Urology;  Laterality: N/A;   CYSTOSCOPY WITH INJECTION N/A 11/09/2018   Procedure: CYSTOSCOPY WITH INJECTION OF INDOCYANINE GREEN  DYE;  Surgeon: Alvaro Hummer, MD;  Location: [facility];  Service: Urology;  Laterality: N/A;   CYSTOSCOPY WITH INSERTION OF UROLIFT N/A 01/18/2018   Procedure: CYSTOSCOPY WITH INSERTION OF UROLIFT;  Surgeon: Brooks Donnice, MD;  Location: [facility];  Service: Urology;  Laterality: N/A;   CYSTOSCOPY/URETEROSCOPY/HOLMIUM LASER Left 02/17/2019   Procedure: URETEROSCOPY WITH BALLOON DILATION  AND NEPHROSTOGRAM;  Surgeon: Alvaro Hummer, MD;  Location: [facility];  Service: Urology;  Laterality: Left;  1 HR   EXCISION RIGHT UPPER ARM LIPOMA  2005   FRACTURE SURGERY     HEMORROIDECTOMY     INGUINAL HERNIA REPAIR Left 1984   IR NEPHROSTOMY PLACEMENT LEFT  01/05/2019   JOINT REPLACEMENT     both  knees, and both shoulders joints replaced.   KNEE ARTHROSCOPY Left X3  LAST ONE  2002   ORIF LEFT HUMEROUS FX  1976   POLYPECTOMY  05/11/2018   Procedure: POLYPECTOMY;  Surgeon: Shaaron Lamar HERO, MD;  [facility];  Service: Endoscopy;;   PROSTATE SURGERY     RIGHT HEART CATH AND CORONARY ANGIOGRAPHY N/A 10/09/2022   Procedure: RIGHT HEART CATH AND CORONARY ANGIOGRAPHY;  Surgeon: Wendel Lurena POUR, MD;  Location: [facility] INVASIVE CV LAB;  Service: Cardiovascular;  Laterality: N/A;   ROBOT ASSISTED LAPAROSCOPIC NEPHRECTOMY Left 07/14/2019   Procedure: XI  ROBOTIC ASSISTED LAPAROSCOPIC RETROPERITONEAL NEPHRECTOMY;  Surgeon: Alvaro Hummer, MD;  Location: [facility];  Service: Urology;  Laterality: Left;  3 HRS   SMALL INTESTINE SURGERY     THYROIDECTOMY Bilateral 04/14/2023   Procedure: TOTAL THYROIDECTOMY;  Surgeon: Jesus Oliphant, MD;  Location: [facility] OR;  Service: ENT;  Laterality: Bilateral;   TONSILLECTOMY  AS CHILD   TOTAL KNEE ARTHROPLASTY Left 2006   REVISION 2007  (AFTER I & D WITH ANTIBIOTIC SPACER PROCEDURE FOR STEPH INFECTION)   TOTAL KNEE ARTHROPLASTY Right 03/30/2016   Procedure: RIGHT TOTAL KNEE ARTHROPLASTY;  Surgeon: Donnice Car, MD;  Location: [facility];  Service: Orthopedics;  Laterality: Right;   TOTAL SHOULDER ARTHROPLASTY Right 04/06/2013   Procedure: RIGHT TOTAL SHOULDER ARTHROPLASTY;  Surgeon: Franky HERO Pointer, MD;  Location: [facility];  Service: Orthopedics;  Laterality: Right;   TOTAL SHOULDER ARTHROPLASTY Left 07/20/2013   Procedure: LEFT TOTAL SHOULDER ARTHROPLASTY;  Surgeon: Franky HERO Pointer, MD;  Location: [facility];  Service: Orthopedics;  Laterality:  Left;   TRANSURETHRAL RESECTION OF BLADDER TUMOR N/A 11/12/2015   Procedure: TRANSURETHRAL RESECTION OF BLADDER TUMOR (TURBT);  Surgeon: Donnice Brooks, MD;  Location: [facility];  Service: Urology;  Laterality: N/A;   TRANSURETHRAL RESECTION OF BLADDER TUMOR WITH GYRUS (TURBT-GYRUS) N/A 12/05/2013   Procedure: TRANSURETHRAL RESECTION OF BLADDER TUMOR WITH GYRUS (TURBT-GYRUS);  Surgeon: Donnice Brooks, MD;  Location: [facility];  Service: Urology;  Laterality: N/A;   Social History:  reports that he quit smoking about 26 years ago. His smoking use included cigarettes. He started smoking about 66 years ago. He has a 80 pack-year smoking history. He has been exposed to tobacco smoke. He has never used smokeless tobacco. He reports that he does not drink alcohol  and does not use drugs.  Allergies  Allergen Reactions   Ciprofloxacin  Itching    Family  History  Problem Relation Age of Onset   Alzheimer's disease Mother    Heart attack Father    Heart disease Father    Arthritis Father    Hearing loss Father    Hypertension Father    Irregular heart beat Brother        DEFIB.   Congestive Heart Failure Brother    Heart disease Brother    Diabetes Daughter    Sleep apnea Neg Hx     Prior to Admission medications   Medication Sig Start Date End Date Taking? Authorizing Provider  Albuterol -Budesonide  90-80 MCG/ACT AERO Take 2 puffs by mouth 4 (four) times daily as needed (shortness of breath.). 11/30/23  Yes [provider]  furosemide  (LASIX ) 20 MG tablet Take 1 tablet (20 mg total) by mouth See admin instructions. Take 1 tablet (20 mg) daily as needed if you gain more than 3 pounds in 1 day or 5 pounds in 1 week. 10/26/23  Yes Hawks, Christy A, [name]  levothyroxine  (SYNTHROID ) 112 MCG tablet Take 112 mcg by mouth every morning.   Yes [provider]  losartan  (COZAAR ) 25 MG tablet Take 1 tablet (25 mg total) by mouth daily. Patient taking differently: Take 12.5 mg by mouth daily. Take 12.5 mg daily 07/12/23 12/24/23 Yes Kate Lonni CROME, MD  metoprolol  succinate (TOPROL -XL) 50 MG 24 hr tablet TAKE 1 TABLET BY MOUTH EVERY DAY Patient taking differently: Take 25 mg by mouth daily. 12/08/23  Yes Jolinda Potter M, DO  Multiple Vitamin (MULTIVITAMIN) capsule Take 1 capsule by mouth daily.   Yes [provider]  polyvinyl alcohol  (LIQUIFILM TEARS) 1.4 % ophthalmic solution Place 1 drop into both eyes as needed for dry eyes. 05/16/19  Yes Sheth, Devam P, MD  spironolactone  (ALDACTONE ) 25 MG tablet Take 12.5 mg by mouth daily. 05/18/23  Yes [provider]    Physical Exam: Vitals:   12/24/23 1545 12/24/23 1600 12/24/23 1610 12/24/23 1626  BP: 119/66 129/64    Pulse: 91 93 95   Resp: (!) 28 (!) 27 20   Temp:    (!) 100.5 F (38.1 C)  TempSrc:    Oral  SpO2: 95% 94% 96%   Weight:      Height:        GENERAL:  A&O x 3, NAD, well developed, cooperative, follows commands HEENT: [place]/AT, No thrush, No icterus, No oral ulcers Neck:  No neck mass, No meningismus, soft, supple CV: RRR, no S3, no S4, no rub, no JVD Lungs:  CTA, no wheeze, no rhonchi, good air movement Abd: soft/NT +BS, nondistended Ext: No edema, no lymphangitis, no  cyanosis, no rashes Neuro:  CN II-XII intact, strength 4/5 in RUE, RLE, strength 4/5 LUE, LLE; sensation intact bilateral; no dysmetria; babinski equivocal  Data Reviewed: Data reviewed above in history  Assessment and Plan: SIRS - Patient presented with fever, tachycardia, and leukocytosis - Secondary to urinary source -Patient has had a complex urologic history with complex urologic anatomy status post left nephrectomy 07/2019 secondary to recurrent pyelonephritis and ureteral stricture -Multiple previous MDR organisms including ESBL Klebsiella, Enterococcus, Morganella, and Pseudomonas -Respiratory panel - COVID/RSV/Flu-neg - CT chest--no pericarditis - CT [facility]--no acute findings.  Status post cystoprostatectomy with right ileal conduit and left nephrectomy and adrenalectomy  Hyponatremia - Secondary to volume depletion - Continue IV fluids  Chronic HFrEF --05/19/2023 echo EF 25 to 30%, grade 1 DD, mild decreased RVF -Clinically euvolemic -Continue metoprolol  succinate -holding losartan  and spironolactone  -Not on SGLT2 secondary to recurrent infections -BiV ICD insertion with Dr. Nancey on 03/01/2023.  Bladder cancer -Status post cystoprostatectomy with ileal conduit -Status post left nephrectomy secondary recurrent pyelonephritis -Follow-up Dr. Alvaro   Thyroid  cancer -Status post thyroidectomy 04/2023 -follow up Dr. Jesus   Thrombocytopenia -Patient has had a history of thrombocytopenia in the past and follows Dr. Gatha -It was felt to be secondary to splenomegaly -Acute worsening secondary to infectious process -Monitor for signs of  bleeding  Coronary artery disease -nonobstructive -LHC on 10/09/2022 showed nonobstructive CAD with 30% RCA stenosis -No chest pain presently -Continue statin   History of SVT -Currently stable with sinus rhythm -Zio patch x 10 days 10/2022 showed 1 episode of NSVT lasting 8 beats, 10 episodes of SVT with longest lasting 19 beats.    Aortic aneurysm:  Ascending aortic aneurysm measuring 45 mm on CTPA 08/2022 -12/24/23 CT chest -- Ascending thoracic aorta 3.9 cm in diameter  Class 2 obesity -BMI 35.19 -lifestyle modification   Advance Care Planning: FULL  Consults: none  Family Communication: spouse 7/25  Severity of Illness: The appropriate patient status for this patient is INPATIENT. Inpatient status is judged to be reasonable and necessary in order to provide the required intensity of service to ensure the patient's safety. The patient's presenting symptoms, physical exam findings, and initial radiographic and laboratory data in the context of their chronic comorbidities is felt to place them at high risk for further clinical deterioration. Furthermore, it is not anticipated that the patient will be medically stable for discharge from the hospital within 2 midnights of admission.   * I certify that at the point of admission it is my clinical judgment that the patient will require inpatient hospital care spanning beyond 2 midnights from the point of admission due to high intensity of service, high risk for further deterioration and high frequency of surveillance required.*  Author: Alm Schneider, MD 12/24/2023 4:37 PM  For on call review [email or web address].

## 2023-12-24 NOTE — ED Notes (Signed)
 ED TO INPATIENT HANDOFF REPORT  ED Nurse Name and Phone #: Warren BECKER RN  [phone]  S Name/Age/Gender Billy Rasmussen 77 y.o. male Room/Bed: APA02/APA02  Code Status   Code Status: Prior  Home/SNF/Other Home Patient oriented to: self Is this baseline? No   Triage Complete: Triage complete  Chief Complaint Sepsis due to undetermined organism [facility]) [A41.9]  Triage Note Pt to the ED via [facility] from home with reports of sepsis. Pt had a oral temp of 102.8 and CBG of 110 with EMS.  Pt has a history of one kidney, AAA, and cannot take tylenol  or other NSAIDS.  Pt responds to commands with EMS but is hearing impaired. Patient also has a pacemaker.    Allergies Allergies  Allergen Reactions   Ciprofloxacin  Itching    Level of Care/Admitting Diagnosis ED Disposition     ED Disposition  Admit   Condition  --   Comment  Hospital Area: [facility] [[id number]]  Level of Care: Telemetry [5]  Covid Evaluation: Confirmed COVID Negative  Diagnosis: Sepsis due to undetermined organism [facility]) [[id number]]  Admitting Physician: TAT, DAVID Darianne.Cuna  Attending Physician: TAT, DAVID Darianne.Cuna  Certification:: I certify this patient will need inpatient services for at least 2 midnights  Expected Medical Readiness: 12/27/2023          B Medical/Surgery History Past Medical History:  Diagnosis Date   AICD (automatic cardioverter/defibrillator) present    [employer]   Anemia aprox. may-1-24   Arthritis    knees, shoulders, elbows   Bladder cancer [facility])    urologist-  dr nieves   CHF (congestive heart failure) (HCC)    Chronic kidney disease    left kidney removed   Diverticulosis of colon    Fibromyalgia    GERD (gastroesophageal reflux disease)    History of diverticulitis of colon    History of gastric ulcer    due to aleve   Hypertension    Lower urinary tract symptoms (LUTS)    Myocardial infarction (HCC) may 1-24   OSA (obstructive sleep apnea)    Wears  CPAP nightly   Oxygen  deficiency    Pericarditis 05/17/2023   PONV (postoperative nausea and vomiting)    Psoriasis    Sepsis (HCC)    january 2021   Sleep apnea    Thyroid  disease    biopsy on 10-08-22   Tinnitus    right ear more, has tranmitter in right ear removable at hs   Ulcer    Wears glasses    Wears partial dentures    Past Surgical History:  Procedure Laterality Date   APPENDECTOMY     BIV ICD INSERTION CRT-D N/A 03/01/2023   Procedure: BIV ICD INSERTION CRT-D;  Surgeon: Nancey Eulas BRAVO, MD;  [facility];  Service: Cardiovascular;  Laterality: N/A;   COLECTOMY W/ COLOSTOMY  1996   W/   APPENDECTOMY   COLONOSCOPY N/A 05/11/2018   Procedure: COLONOSCOPY;  Surgeon: Shaaron Lamar HERO, MD;  [facility];  Service: Endoscopy;  Laterality: N/A;  9:30   COLOSTOMY TAKEDOWN  1996   CYSTOSCOPY WITH BIOPSY N/A 12/05/2013   Procedure: CYSTO BLADDER BIOPSY AND FULGERATION;  Surgeon: Donnice nieves, MD;  Location: [facility];  Service: Urology;  Laterality: N/A;   CYSTOSCOPY WITH BIOPSY Bilateral 11/13/2014   Procedure: CYSTOSCOPY WITH  BLADDER BIOPSY FULGERATION AND BILATERAL RETROGRADE PYELOGRAMS;  Surgeon: Donnice nieves, MD;  Location: [facility];  Service: Urology;  Laterality: Bilateral;   CYSTOSCOPY WITH FULGERATION N/A 01/18/2018   Procedure: CYSTOSCOPY WITH FULGERATION/ BLADDER BIOPSY;  Surgeon: Nieves Cough, MD;  Location: [facility];  Service: Urology;  Laterality: N/A;   CYSTOSCOPY WITH INJECTION N/A 11/09/2018   Procedure: CYSTOSCOPY WITH INJECTION OF INDOCYANINE GREEN  DYE;  Surgeon: Alvaro Hummer, MD;  Location: [facility];  Service: Urology;  Laterality: N/A;   CYSTOSCOPY WITH INSERTION OF UROLIFT N/A 01/18/2018   Procedure: CYSTOSCOPY WITH INSERTION OF UROLIFT;  Surgeon: Nieves Cough, MD;  Location: [facility];  Service: Urology;  Laterality: N/A;    CYSTOSCOPY/URETEROSCOPY/HOLMIUM LASER Left 02/17/2019   Procedure: URETEROSCOPY WITH BALLOON DILATION  AND NEPHROSTOGRAM;  Surgeon: Alvaro Hummer, MD;  Location: [facility];  Service: Urology;  Laterality: Left;  1 HR   EXCISION RIGHT UPPER ARM LIPOMA  2005   FRACTURE SURGERY     HEMORROIDECTOMY     INGUINAL HERNIA REPAIR Left 1984   IR NEPHROSTOMY PLACEMENT LEFT  01/05/2019   JOINT REPLACEMENT     both  knees, and both shoulders joints replaced.   KNEE ARTHROSCOPY Left X3  LAST ONE  2002   ORIF LEFT HUMEROUS FX  1976   POLYPECTOMY  05/11/2018   Procedure: POLYPECTOMY;  Surgeon: Shaaron Lamar HERO, MD;  [facility];  Service: Endoscopy;;   PROSTATE SURGERY     RIGHT HEART CATH AND CORONARY ANGIOGRAPHY N/A 10/09/2022   Procedure: RIGHT HEART CATH AND CORONARY ANGIOGRAPHY;  Surgeon: Wendel Lurena POUR, MD;  [facility];  Service: Cardiovascular;  Laterality: N/A;   ROBOT ASSISTED LAPAROSCOPIC NEPHRECTOMY Left 07/14/2019   Procedure: XI ROBOTIC ASSISTED LAPAROSCOPIC RETROPERITONEAL NEPHRECTOMY;  Surgeon: Alvaro Hummer, MD;  [facility];  Service: Urology;  Laterality: Left;  3 HRS   SMALL INTESTINE SURGERY     THYROIDECTOMY Bilateral 04/14/2023   Procedure: TOTAL THYROIDECTOMY;  Surgeon: Jesus Oliphant, MD;  Location[facility];  Service: ENT;  Laterality: Bilateral;   TONSILLECTOMY  AS CHILD   TOTAL KNEE ARTHROPLASTY Left 2006   REVISION 2007  (AFTER I & D WITH ANTIBIOTIC SPACER PROCEDURE FOR STEPH INFECTION)   TOTAL KNEE ARTHROPLASTY Right 03/30/2016   Procedure: RIGHT TOTAL KNEE ARTHROPLASTY;  Surgeon: Cough Car, MD;  Location[facility];  Service: Orthopedics;  Laterality: Right;   TOTAL SHOULDER ARTHROPLASTY Right 04/06/2013   Procedure: RIGHT TOTAL SHOULDER ARTHROPLASTY;  Surgeon: Franky HERO Pointer, MD;  [facility];  Service: Orthopedics;  Laterality: Right;   TOTAL SHOULDER ARTHROPLASTY Left 07/20/2013   Procedure: LEFT TOTAL SHOULDER  ARTHROPLASTY;  Surgeon: Franky HERO Pointer, MD;  [facility];  Service: Orthopedics;  Laterality: Left;   TRANSURETHRAL RESECTION OF BLADDER TUMOR N/A 11/12/2015   Procedure: TRANSURETHRAL RESECTION OF BLADDER TUMOR (TURBT);  Surgeon: Cough Nieves, MD;  Location: [facility];  Service: Urology;  Laterality: N/A;   TRANSURETHRAL RESECTION OF BLADDER TUMOR WITH GYRUS (TURBT-GYRUS) N/A 12/05/2013   Procedure: TRANSURETHRAL RESECTION OF BLADDER TUMOR WITH GYRUS (TURBT-GYRUS);  Surgeon: Cough Nieves, MD;  Location: [facility];  Service: Urology;  Laterality: N/A;     A IV Location/Drains/Wounds Patient Lines/Drains/Airways Status     Active Line/Drains/Airways     Name Placement date Placement time Site Days   Peripheral IV 12/24/23 Anterior;Distal;Right;Upper Arm 12/24/23  0924  Arm  less than 1   Urostomy Ileal conduit RLQ 07/14/19  1000  RLQ  1624  Intake/Output Last 24 hours No intake or output data in the 24 hours ending 12/24/23 1609  Labs/Imaging Results for orders placed or performed during the hospital encounter of 12/24/23 (from the past 48 hours)  Urinalysis, w/ Reflex to Culture (Infection Suspected) -Urine, Unspecified Source     Status: Abnormal   Collection Time: 12/24/23  9:31 AM  Result Value Ref Range   Specimen Source URINE, CATHETERIZED    Color, Urine YELLOW YELLOW   APPearance CLEAR CLEAR   Specific Gravity, Urine 1.015 1.005 - 1.030   pH 6.0 5.0 - 8.0   Glucose, UA NEGATIVE NEGATIVE mg/dL   Hgb urine dipstick LARGE (A) NEGATIVE   Bilirubin Urine NEGATIVE NEGATIVE   Ketones, ur NEGATIVE NEGATIVE mg/dL   Protein, ur 30 (A) NEGATIVE mg/dL   Nitrite NEGATIVE NEGATIVE   Leukocytes,Ua NEGATIVE NEGATIVE   RBC / HPF >50 0 - 5 RBC/hpf   WBC, UA 6-10 0 - 5 WBC/hpf    Comment:        Reflex urine culture not performed if WBC <=10, OR if Squamous epithelial cells >5. If Squamous epithelial cells >5 suggest  recollection.    Bacteria, UA NONE SEEN NONE SEEN   Squamous Epithelial / HPF 0-5 0 - 5 /HPF    Comment: Performed at [facility], [address], [place], [place] [address]  Comprehensive metabolic panel     Status: Abnormal   Collection Time: 12/24/23  9:38 AM  Result Value Ref Range   Sodium 134 (L) 135 - 145 mmol/L   Potassium 3.9 3.5 - 5.1 mmol/L   Chloride 101 98 - 111 mmol/L   CO2 19 (L) 22 - 32 mmol/L   Glucose, Bld 120 (H) 70 - 99 mg/dL    Comment: Glucose reference range applies only to samples taken after fasting for at least 8 hours.   BUN 29 (H) 8 - 23 mg/dL   Creatinine, Ser 8.38 (H) 0.61 - 1.24 mg/dL   Calcium  9.0 8.9 - 10.3 mg/dL   Total Protein 8.0 6.5 - 8.1 g/dL   Albumin  4.2 3.5 - 5.0 g/dL   AST 34 15 - 41 U/L   ALT 18 0 - 44 U/L   Alkaline Phosphatase 58 38 - 126 U/L   Total Bilirubin 1.5 (H) 0.0 - 1.2 mg/dL   GFR, Estimated 44 (L) >60 mL/min    Comment: (NOTE) Calculated using the CKD-EPI Creatinine Equation (2021)    Anion gap 14 5 - 15    Comment: Performed at [facility], [address], [place], [place] [address]  Lactic acid, plasma     Status: None   Collection Time: 12/24/23  9:38 AM  Result Value Ref Range   Lactic Acid, Venous 1.1 0.5 - 1.9 mmol/L    Comment: Performed at [facility], [address], [place], [place] [address]  CBC with Differential     Status: Abnormal   Collection Time: 12/24/23  9:38 AM  Result Value Ref Range   WBC 12.6 (H) 4.0 - 10.5 K/uL   RBC 4.06 (L) 4.22 - 5.81 MIL/uL   Hemoglobin 13.4 13.0 - 17.0 g/dL   HCT 61.5 (L) 60.9 - 47.9 %   MCV 94.6 80.0 - 100.0 fL   MCH 33.0 26.0 - 34.0 pg   MCHC 34.9 30.0 - 36.0 g/dL   RDW 86.0 88.4 - 84.4 %   Platelets 116 (L) 150 - 400 K/uL   nRBC 0.0 0.0 - 0.2 %   Neutrophils Relative %  88 %   Neutro Abs 11.1 (H) 1.7 - 7.7 K/uL   Lymphocytes Relative 4 %   Lymphs Abs 0.5 (L) 0.7 - 4.0 K/uL   Monocytes Relative 7 %   Monocytes Absolute 0.9 0.1 - 1.0 K/uL   Eosinophils Relative 0  %   Eosinophils Absolute 0.0 0.0 - 0.5 K/uL   Basophils Relative 0 %   Basophils Absolute 0.0 0.0 - 0.1 K/uL   Immature Granulocytes 1 %   Abs Immature Granulocytes 0.08 (H) 0.00 - 0.07 K/uL    Comment: Performed at [facility], [address], [place], [place] [address]  Protime-INR     Status: Abnormal   Collection Time: 12/24/23  9:38 AM  Result Value Ref Range   Prothrombin Time 16.3 (H) 11.4 - 15.2 seconds   INR 1.2 0.8 - 1.2    Comment: (NOTE) INR goal varies based on device and disease states. Performed at [facility], [address], [place], [place] [address]   Brain natriuretic peptide     Status: Abnormal   Collection Time: 12/24/23  9:44 AM  Result Value Ref Range   B Natriuretic Peptide 187.0 (H) 0.0 - 100.0 pg/mL    Comment: Performed at [facility], [address], [place], [place] [address]  Blood gas, venous ([facility], [facility], [facility])     Status: Abnormal   Collection Time: 12/24/23  9:44 AM  Result Value Ref Range   pH, Ven 7.4 7.25 - 7.43   pCO2, Ven 39 (L) 44 - 60 mmHg   pO2, Ven 57 (H) 32 - 45 mmHg   Bicarbonate 23.9 20.0 - 28.0 mmol/L   Acid-base deficit 0.1 0.0 - 2.0 mmol/L   O2 Saturation 84.3 %   Patient temperature 39.1    Collection site BLOOD LEFT HAND    Drawn by 1631     Comment: Performed at [facility], [address] [place], [place] [address]  Resp panel by RT-PCR (RSV, Flu A&B, Covid) Anterior Nasal Swab     Status: None   Collection Time: 12/24/23  9:50 AM   Specimen: Anterior Nasal Swab  Result Value Ref Range   SARS Coronavirus 2 by RT PCR NEGATIVE NEGATIVE    Comment: (NOTE) SARS-CoV-2 target nucleic acids are NOT DETECTED.  The SARS-CoV-2 RNA is generally detectable in upper respiratory specimens during the acute phase of infection. The lowest concentration of SARS-CoV-2 viral copies this assay can detect is 138 copies/mL. A negative result does not preclude SARS-Cov-2 infection and should not be used as the sole basis for treatment or other  patient management decisions. A negative result may occur with  improper specimen collection/handling, submission of specimen other than nasopharyngeal swab, presence of viral mutation(s) within the areas targeted by this assay, and inadequate number of viral copies(<138 copies/mL). A negative result must be combined with clinical observations, patient history, and epidemiological information. The expected result is Negative.  Fact Sheet for Patients:  BloggerCourse.com  Fact Sheet for Healthcare Providers:  SeriousBroker.it  This test is no t yet approved or cleared by the [place]  FDA and  has been authorized for detection and/or diagnosis of SARS-CoV-2 by FDA under an Emergency Use Authorization (EUA). This EUA will remain  in effect (meaning this test can be used) for the duration of the COVID-19 declaration under Section 564(b)(1) of the Act, 21 U.S.C.section 360bbb-3(b)(1), unless the authorization is terminated  or revoked sooner.       Influenza A by PCR NEGATIVE NEGATIVE  Influenza B by PCR NEGATIVE NEGATIVE    Comment: (NOTE) The Xpert Xpress SARS-CoV-2/FLU/RSV plus assay is intended as an aid in the diagnosis of influenza from Nasopharyngeal swab specimens and should not be used as a sole basis for treatment. Nasal washings and aspirates are unacceptable for Xpert Xpress SARS-CoV-2/FLU/RSV testing.  Fact Sheet for Patients: BloggerCourse.com  Fact Sheet for Healthcare Providers: SeriousBroker.it  This test is not yet approved or cleared by the [place]  FDA and has been authorized for detection and/or diagnosis of SARS-CoV-2 by FDA under an Emergency Use Authorization (EUA). This EUA will remain in effect (meaning this test can be used) for the duration of the COVID-19 declaration under Section 564(b)(1) of the Act, 21 U.S.C. section 360bbb-3(b)(1), unless  the authorization is terminated or revoked.     Resp Syncytial Virus by PCR NEGATIVE NEGATIVE    Comment: (NOTE) Fact Sheet for Patients: BloggerCourse.com  Fact Sheet for Healthcare Providers: SeriousBroker.it  This test is not yet approved or cleared by the [place]  FDA and has been authorized for detection and/or diagnosis of SARS-CoV-2 by FDA under an Emergency Use Authorization (EUA). This EUA will remain in effect (meaning this test can be used) for the duration of the COVID-19 declaration under Section 564(b)(1) of the Act, 21 U.S.C. section 360bbb-3(b)(1), unless the authorization is terminated or revoked.  Performed at [facility], [address] [place], [place] [address]   Lactic acid, plasma     Status: None   Collection Time: 12/24/23 11:24 AM  Result Value Ref Range   Lactic Acid, Venous 1.1 0.5 - 1.9 mmol/L    Comment: Performed at [facility], [address], [place], [place] [address]   CT CHEST ABDOMEN PELVIS W CONTRAST Result Date: 12/24/2023 CLINICAL DATA:  Parathyroid and bladder cancer. Sepsis, fever. Prior cystoprostatectomy with ileal conduit. Prior left nephrectomy and adrenalectomy. Partial colectomy due to diverticulitis. * Tracking Code: BO * EXAM: CT CHEST, ABDOMEN, AND PELVIS WITH CONTRAST TECHNIQUE: Multidetector CT imaging of the chest, abdomen and pelvis was performed following the standard protocol during bolus administration of intravenous contrast. RADIATION DOSE REDUCTION: This exam was performed according to the departmental dose-optimization program which includes automated exposure control, adjustment of the mA and/or kV according to patient size and/or use of iterative reconstruction technique. CONTRAST:  75mL OMNIPAQUE  IOHEXOL  300 MG/ML  SOLN COMPARISON:  12/10/2023, 10/21/2023 FINDINGS: CT CHEST FINDINGS Cardiovascular: Mild aortic atherosclerosis. Ascending thoracic aorta 3.9 cm in diameter,  not formally qualifying as aneurysm. Moderate cardiomegaly, increased from 12/10/2023. AICD noted. Mediastinum/Nodes: Unremarkable Lungs/Pleura: Mild atelectasis medially in the left lower lobe. Musculoskeletal: Bilateral total shoulder arthroplasties. Thoracic spondylosis. CT ABDOMEN PELVIS FINDINGS Hepatobiliary: Numerous small layering gallstones in the gallbladder. Pancreas: Unremarkable Spleen: Unremarkable Adrenals/Urinary Tract: Right adrenal gland unremarkable. Right abdominal ileal conduit at the level of the iliac crests. No overt hydronephrosis. Left nephrectomy and adrenalectomy. Cystoprostatectomy. Stomach/Bowel: As before, a right groin hernia contains loops of small bowel without current findings of strangulation or obstruction. Postoperative findings in the sigmoid colon with some residual sigmoid colon diverticulosis. Vascular/Lymphatic: Atherosclerosis is present, including aortoiliac atherosclerotic disease. Reproductive: Cystoprostatectomy. Other: No supplemental non-categorized findings. Musculoskeletal: Left lumbar hernia, image 71 series 2, no change. This contains adipose tissue. Loss of disc height and potentially partial fusion at L2-3. Lumbar spondylosis and degenerative disc disease. IMPRESSION: 1. No specific cause for sepsis is identified. 2. Moderate cardiomegaly, increased from 12/10/2023. 3. Cholelithiasis. 4. Cystoprostatectomy with right abdominal ileal conduit. Left nephrectomy and adrenalectomy. 5.  Right groin hernia contains loops of small bowel without current findings of strangulation or obstruction. 6. Left lumbar hernia contains adipose tissue. 7.  Aortic Atherosclerosis (ICD10-I70.0). Electronically Signed   By: Ryan Salvage M.D.   On: 12/24/2023 14:48   DG Chest Port 1 View if patient is in a treatment room. Result Date: 12/24/2023 CLINICAL DATA:  Sepsis.  Fever. EXAM: PORTABLE CHEST 1 VIEW COMPARISON:  December 10, 2023. FINDINGS: Stable cardiomegaly. Left-sided  defibrillator is unchanged. Lungs are clear. Bilateral shoulder arthroplasties. IMPRESSION: No active disease. Electronically Signed   By: Lynwood Landy Raddle M.D.   On: 12/24/2023 09:54    Pending Labs Unresulted Labs (From admission, onward)     Start     Ordered   12/24/23 1558  Urine Culture  Once,   URGENT       Question:  Indication  Answer:  Sepsis   12/24/23 1557   12/24/23 0931  Culture, blood (Routine x 2)  BLOOD CULTURE X 2,   R (with STAT occurrences)      12/24/23 0930            Vitals/Pain Today's Vitals   12/24/23 1500 12/24/23 1515 12/24/23 1528 12/24/23 1530  BP: (!) 148/84 139/69  (!) 147/74  Pulse: (!) 106 86  72  Resp: (!) 29 20  (!) 23  Temp:      TempSrc:      SpO2: 92% 92%  93%  Weight:      Height:      PainSc:   0-No pain     Isolation Precautions No active isolations  Medications Medications  vancomycin  (VANCOREADY) IVPB 2000 mg/400 mL (2,000 mg Intravenous New Bag/Given 12/24/23 1428)  acetaminophen  (TYLENOL ) tablet 650 mg (650 mg Oral Given 12/24/23 0955)  ceFEPIme  (MAXIPIME ) 2 g in sodium chloride  0.9 % 100 mL IVPB (0 g Intravenous Stopped 12/24/23 1024)  metroNIDAZOLE  (FLAGYL ) IVPB 500 mg (0 mg Intravenous Stopped 12/24/23 1431)  acetaminophen  (TYLENOL ) tablet 650 mg (650 mg Oral Given 12/24/23 1426)  iohexol  (OMNIPAQUE ) 300 MG/ML solution 75 mL (75 mLs Intravenous Contrast Given 12/24/23 1406)    Mobility walks with person assist     Focused Assessments Cardiac Assessment Handoff:    Lab Results  Component Value Date   CKTOTAL 31 (L) 11/05/2022   CKMB 0 11/05/2022   Lab Results  Component Value Date   DDIMER 1.28 (H) 05/13/2023   Does the Patient currently have chest pain? No    R Recommendations: See Admitting Provider Note  Report given to:   Additional Notes: Patient has a urostomy bag.

## 2023-12-25 LAB — CBC
HCT: 38 % — ABNORMAL LOW (ref 39.0–52.0)
Hemoglobin: 13 g/dL (ref 13.0–17.0)
MCH: 32.7 pg (ref 26.0–34.0)
MCHC: 34.2 g/dL (ref 30.0–36.0)
MCV: 95.7 fL (ref 80.0–100.0)
Platelets: 90 K/uL — ABNORMAL LOW (ref 150–400)
RBC: 3.97 MIL/uL — ABNORMAL LOW (ref 4.22–5.81)
RDW: 14.2 % (ref 11.5–15.5)
WBC: 10.6 K/uL — ABNORMAL HIGH (ref 4.0–10.5)
nRBC: 0 % (ref 0.0–0.2)

## 2023-12-25 LAB — RESPIRATORY PANEL BY PCR

## 2023-12-25 LAB — COMPREHENSIVE METABOLIC PANEL WITH GFR
ALT: 15 U/L (ref 0–44)
AST: 29 U/L (ref 15–41)
Albumin: 3.8 g/dL (ref 3.5–5.0)
Alkaline Phosphatase: 49 U/L (ref 38–126)
Anion gap: 12 (ref 5–15)
BUN: 27 mg/dL — ABNORMAL HIGH (ref 8–23)
CO2: 21 mmol/L — ABNORMAL LOW (ref 22–32)
Calcium: 9 mg/dL (ref 8.9–10.3)
Chloride: 101 mmol/L (ref 98–111)
Creatinine, Ser: 1.57 mg/dL — ABNORMAL HIGH (ref 0.61–1.24)
GFR, Estimated: 45 mL/min — ABNORMAL LOW (ref >60)
Glucose, Bld: 121 mg/dL — ABNORMAL HIGH (ref 70–99)
Potassium: 4.1 mmol/L (ref 3.5–5.1)
Sodium: 134 mmol/L — ABNORMAL LOW (ref 135–145)
Total Bilirubin: 1.6 mg/dL — ABNORMAL HIGH (ref 0.0–1.2)
Total Protein: 7.6 g/dL (ref 6.5–8.1)

## 2023-12-25 LAB — URINE CULTURE: Culture: NO GROWTH

## 2023-12-25 LAB — PROCALCITONIN: Procalcitonin: 0.58 ng/mL

## 2023-12-25 MED ORDER — VANCOMYCIN HCL 750 MG/150ML IV SOLN: 750.0000 mg | INTRAVENOUS | Status: DC

## 2023-12-25 MED ORDER — VANCOMYCIN HCL 2000 MG/400ML IV SOLN
2000.0000 mg | Freq: Once | INTRAVENOUS | Status: DC
  Filled 2023-12-25: qty 400

## 2023-12-25 MED ORDER — VANCOMYCIN HCL 2000 MG/400ML IV SOLN
2000.0000 mg | Freq: Once | INTRAVENOUS | Status: AC
  Administered 2023-12-25: 2000 mg via INTRAVENOUS
  Filled 2023-12-25 (×2): qty 400

## 2023-12-25 NOTE — Plan of Care (Signed)

## 2023-12-25 NOTE — Progress Notes (Signed)
 PROGRESS NOTE  Billy Rasmussen FMW:[id number] DOB: [birth year] DOA: 12/24/2023 PCP: [name], DO  Brief History:  78 year old male with a history of with history of bladder cancer status post cystoprostatectomy with ileal conduit 10/2018, left nephrectomy (07/2019), HFrEF (09/2022, LVEF was <20% ) s/p CRT-D (01/2023), nonobstructive CAD LBBB, prior LV thrombus, pericarditis 05/2023, hypertension, HLD, CKD stage IIIa, thyroid  cancer status post thyroidectomy (04/2023) presenting with fever and confusion and generalized weakness with associated fevers and chills.   Notably, the patient has had numerous similar hospitalizations in the past. Most recently, he was admitted from 08/21/2023 to 08/26/2023 with fever and leukopenia with concerns of pyelonephritis.  At that time, there was also concern for tickborne infection.  He was discharged home with doxycycline .  He was readmitted to the hospital from 10/21/2023 to 10/25/2023.  During that hospitalization, he was treated for severe sepsis.  His urine culture grew out Pseudomonas and pantoea.  He was discharged with 7 more days of ciprofloxacin .  The patient had been in his usual state of health until he developed some chills around 10 PM on the evening of 12/23/2023.  There has been no recent complaints of headache, chest pain, dyspnea, nausea, vomiting, diarrhea, abdominal pain, dysuria, hematuria, hematochezia, and melena. The wife states that she noticed that the urine from his ileal conduit was yellowish in color which is not normal for him. On the morning of 12/24/2023, the patient was somnolent but arousable.  He had generalized weakness.  Wife took his temperature and it was 14 F.  EMS was activated.  In the ED, the patient was febrile up to 102.3 F.  He was tachycardic 110s, and hemodynamically stable.  Oxygen  saturation is 95-98% on room air.  WBC 12.6, hemoglobin 13.4, platelet 116.  Sodium 134, potassium 3.9, bicarbonate 19,  serum creatinine 1.61.      Assessment/Plan: SIRS - Patient presented with fever, tachycardia, and leukocytosis - Secondary to urinary source -Patient has had a complex urologic history with complex urologic anatomy status post left nephrectomy 07/2019 secondary to recurrent pyelonephritis and ureteral stricture -Multiple previous MDR organisms including ESBL Klebsiella, Enterococcus, Morganella, and Pseudomonas -Respiratory panel - COVID/RSV/Flu-neg - CT chest--no pericarditis - CT AP--no acute findings.  Status post cystoprostatectomy with right ileal conduit and left nephrectomy and adrenalectomy - fevers trending down - follow blood culture--neg to date - follow urine culture-pending - PCT 0.58   Hyponatremia - Secondary to volume depletion - Continue IV fluids   Chronic HFrEF --05/19/2023 echo EF 25 to 30%, grade 1 DD, mild decreased RVF -Clinically euvolemic -Continue metoprolol  succinate -holding losartan  and spironolactone  -Not on SGLT2 secondary to recurrent infections -BiV ICD insertion with Dr. Nancey on 03/01/2023.   Bladder cancer -Status post cystoprostatectomy with ileal conduit -Status post left nephrectomy secondary recurrent pyelonephritis -Follow-up Dr. Alvaro   Thyroid  cancer -Status post thyroidectomy 04/2023 -follow up Dr. Jesus -continue synthroid    Thrombocytopenia -Patient has had a history of thrombocytopenia in the past and follows Dr. Gatha -It was felt to be secondary to splenomegaly -Acute worsening secondary to infectious process -Monitor for signs of bleeding   Coronary artery disease -nonobstructive -LHC on 10/09/2022 showed nonobstructive CAD with 30% RCA stenosis -No chest pain presently -Continue statin   History of SVT -Currently stable with sinus rhythm -Zio patch x 10 days 10/2022 showed 1 episode of NSVT lasting 8 beats, 10 episodes of SVT with longest lasting 19 beats.  Aortic aneurysm:  Ascending aortic aneurysm  measuring 45 mm on CTPA 08/2022 -12/24/23 CT chest -- Ascending thoracic aorta 3.9 cm in diameter   Class 2 obesity -BMI 35.19 -lifestyle modification      Family Communication:   spouse 7/26  Consultants:  none  Code Status:  FULL   DVT Prophylaxis:  [place] Heparin    Procedures: As Listed in Progress Note Above  Antibiotics: Merrem  7/25>> Vanc 7/25>>      Subjective: Patient complains of myalgias and arthralgias.  Denies f/c, cp, sob, abd pain.  Complains low back pain.  No leg weakness. No bowel or bladder incontinence   Objective: Vitals:   12/25/23 0321 12/25/23 0610 12/25/23 1232 12/25/23 1623  BP: (!) 156/86 118/73 135/68 (!) 149/73  Pulse: 95 78 92 96  Resp: 20 20 20  (!) 21  Temp: (!) 101.4 F (38.6 C) 99.2 F (37.3 C) 98.8 F (37.1 C) 98.7 F (37.1 C)  TempSrc: Oral Oral Oral Oral  SpO2: 99% 98% 98% 97%  Weight:      Height:        Intake/Output Summary (Last 24 hours) at 12/25/2023 1640 Last data filed at 12/25/2023 1623 Gross per 24 hour  Intake 622.25 ml  Output 2100 ml  Net -1477.75 ml   Weight change:  Exam:  General:  Pt is alert, follows commands appropriately, not in acute distress HEENT: No icterus, No thrush, No neck mass, [place]/AT Cardiovascular: RRR, S1/S2, no rubs, no gallops Respiratory: CTA bilaterally, no wheezing, no crackles, no rhonchi Abdomen: Soft/+BS, non tender, non distended, no guarding Extremities: No edema, No lymphangitis, No petechiae, No rashes, no synovitis;  negative straight leg raise   Data Reviewed: I have personally reviewed following labs and imaging studies Basic Metabolic Panel: Recent Labs  Lab 12/24/23 0938 12/25/23 0425  NA 134* 134*  K 3.9 4.1  CL 101 101  CO2 19* 21*  GLUCOSE 120* 121*  BUN 29* 27*  CREATININE 1.61* 1.57*  CALCIUM  9.0 9.0   Liver Function Tests: Recent Labs  Lab 12/24/23 0938 12/25/23 0425  AST 34 29  ALT 18 15  ALKPHOS 58 49  BILITOT 1.5* 1.6*  PROT 8.0 7.6   ALBUMIN  4.2 3.8   No results for input(s): LIPASE, AMYLASE in the last 168 hours. No results for input(s): AMMONIA in the last 168 hours. Coagulation Profile: Recent Labs  Lab 12/24/23 0938  INR 1.2   CBC: Recent Labs  Lab 12/24/23 0938 12/25/23 0425  WBC 12.6* 10.6*  NEUTROABS 11.1*  --   HGB 13.4 13.0  HCT 38.4* 38.0*  MCV 94.6 95.7  PLT 116* 90*   Cardiac Enzymes: No results for input(s): CKTOTAL, CKMB, CKMBINDEX, TROPONINI in the last 168 hours. BNP: Invalid input(s): POCBNP CBG: No results for input(s): GLUCAP in the last 168 hours. HbA1C: No results for input(s): HGBA1C in the last 72 hours. Urine analysis:    Component Value Date/Time   COLORURINE YELLOW 12/24/2023 0931   APPEARANCEUR CLEAR 12/24/2023 0931   APPEARANCEUR Clear 11/26/2022 0833   LABSPEC 1.015 12/24/2023 0931   PHURINE 6.0 12/24/2023 0931   GLUCOSEU NEGATIVE 12/24/2023 0931   HGBUR LARGE (A) 12/24/2023 0931   BILIRUBINUR NEGATIVE 12/24/2023 0931   BILIRUBINUR Negative 11/26/2022 0833   KETONESUR NEGATIVE 12/24/2023 0931   PROTEINUR 30 (A) 12/24/2023 0931   NITRITE NEGATIVE 12/24/2023 0931   LEUKOCYTESUR NEGATIVE 12/24/2023 0931   Sepsis Labs: @LABRCNTIP (procalcitonin:4,lacticidven:4) ) Recent Results (from the past 240 hours)  Culture, blood (Routine  x 2)     Status: None (Preliminary result)   Collection Time: 12/24/23  9:38 AM   Specimen: BLOOD  Result Value Ref Range Status   Specimen Description BLOOD BLOOD RIGHT HAND  Final   Special Requests   Final    BOTTLES DRAWN AEROBIC AND ANAEROBIC Blood Culture adequate volume   Culture   Final    NO GROWTH < 24 HOURS Performed at [facility], [address], [place], [place] [address]    Report Status PENDING  Incomplete  Resp panel by RT-PCR (RSV, Flu A&B, Covid) Anterior Nasal Swab     Status: None   Collection Time: 12/24/23  9:50 AM   Specimen: Anterior Nasal Swab  Result Value Ref Range Status   SARS  Coronavirus 2 by RT PCR NEGATIVE NEGATIVE Final    Comment: (NOTE) SARS-CoV-2 target nucleic acids are NOT DETECTED.  The SARS-CoV-2 RNA is generally detectable in upper respiratory specimens during the acute phase of infection. The lowest concentration of SARS-CoV-2 viral copies this assay can detect is 138 copies/mL. A negative result does not preclude SARS-Cov-2 infection and should not be used as the sole basis for treatment or other patient management decisions. A negative result may occur with  improper specimen collection/handling, submission of specimen other than nasopharyngeal swab, presence of viral mutation(s) within the areas targeted by this assay, and inadequate number of viral copies(<138 copies/mL). A negative result must be combined with clinical observations, patient history, and epidemiological information. The expected result is Negative.  Fact Sheet for Patients:  BloggerCourse.com  Fact Sheet for Healthcare Providers:  SeriousBroker.it  This test is no t yet approved or cleared by the [place]  FDA and  has been authorized for detection and/or diagnosis of SARS-CoV-2 by FDA under an Emergency Use Authorization (EUA). This EUA will remain  in effect (meaning this test can be used) for the duration of the COVID-19 declaration under Section 564(b)(1) of the Act, 21 U.S.C.section 360bbb-3(b)(1), unless the authorization is terminated  or revoked sooner.       Influenza A by PCR NEGATIVE NEGATIVE Final   Influenza B by PCR NEGATIVE NEGATIVE Final    Comment: (NOTE) The Xpert Xpress SARS-CoV-2/FLU/RSV plus assay is intended as an aid in the diagnosis of influenza from Nasopharyngeal swab specimens and should not be used as a sole basis for treatment. Nasal washings and aspirates are unacceptable for Xpert Xpress SARS-CoV-2/FLU/RSV testing.  Fact Sheet for  Patients: BloggerCourse.com  Fact Sheet for Healthcare Providers: SeriousBroker.it  This test is not yet approved or cleared by the [place]  FDA and has been authorized for detection and/or diagnosis of SARS-CoV-2 by FDA under an Emergency Use Authorization (EUA). This EUA will remain in effect (meaning this test can be used) for the duration of the COVID-19 declaration under Section 564(b)(1) of the Act, 21 U.S.C. section 360bbb-3(b)(1), unless the authorization is terminated or revoked.     Resp Syncytial Virus by PCR NEGATIVE NEGATIVE Final    Comment: (NOTE) Fact Sheet for Patients: BloggerCourse.com  Fact Sheet for Healthcare Providers: SeriousBroker.it  This test is not yet approved or cleared by the [place]  FDA and has been authorized for detection and/or diagnosis of SARS-CoV-2 by FDA under an Emergency Use Authorization (EUA). This EUA will remain in effect (meaning this test can be used) for the duration of the COVID-19 declaration under Section 564(b)(1) of the Act, 21 U.S.C. section 360bbb-3(b)(1), unless the authorization is terminated or revoked.  Performed at [facility]  [facility], [address], [place], [place] [address]   Culture, blood (Routine x 2)     Status: None (Preliminary result)   Collection Time: 12/24/23  9:51 AM   Specimen: BLOOD  Result Value Ref Range Status   Specimen Description BLOOD BLOOD RIGHT HAND  Final   Special Requests   Final    BOTTLES DRAWN AEROBIC AND ANAEROBIC Blood Culture adequate volume   Culture   Final    NO GROWTH < 24 HOURS Performed at [facility], [address] [place], [place] [address]    Report Status PENDING  Incomplete  Respiratory (~20 pathogens) panel by PCR     Status: None   Collection Time: 12/25/23  4:05 AM   Specimen: Nasopharyngeal Swab; Respiratory  Result Value Ref Range Status   Adenovirus NOT  DETECTED NOT DETECTED Final   Coronavirus 229E NOT DETECTED NOT DETECTED Final    Comment: (NOTE) The Coronavirus on the Respiratory Panel, DOES NOT test for the novel  Coronavirus (2019 nCoV)    Coronavirus HKU1 NOT DETECTED NOT DETECTED Final   Coronavirus NL63 NOT DETECTED NOT DETECTED Final   Coronavirus OC43 NOT DETECTED NOT DETECTED Final   Metapneumovirus NOT DETECTED NOT DETECTED Final   Rhinovirus / Enterovirus NOT DETECTED NOT DETECTED Final   Influenza A NOT DETECTED NOT DETECTED Final   Influenza B NOT DETECTED NOT DETECTED Final   Parainfluenza Virus 1 NOT DETECTED NOT DETECTED Final   Parainfluenza Virus 2 NOT DETECTED NOT DETECTED Final   Parainfluenza Virus 3 NOT DETECTED NOT DETECTED Final   Parainfluenza Virus 4 NOT DETECTED NOT DETECTED Final   Respiratory Syncytial Virus NOT DETECTED NOT DETECTED Final   Bordetella pertussis NOT DETECTED NOT DETECTED Final   Bordetella Parapertussis NOT DETECTED NOT DETECTED Final   Chlamydophila pneumoniae NOT DETECTED NOT DETECTED Final   Mycoplasma pneumoniae NOT DETECTED NOT DETECTED Final    Comment: Performed at [facility], [address] [place], [place] [address]     Scheduled Meds:  heparin   5,000 Units Subcutaneous Q8H   levothyroxine   112 mcg Oral q morning   metoprolol  succinate  25 mg Oral Daily   multivitamin with minerals  1 tablet Oral Daily   Continuous Infusions:  lactated ringers  Stopped (12/25/23 0131)   meropenem  (MERREM ) IV Stopped (12/25/23 0630)    Procedures/Studies: CT CHEST ABDOMEN PELVIS W CONTRAST Result Date: 12/24/2023 CLINICAL DATA:  Parathyroid and bladder cancer. Sepsis, fever. Prior cystoprostatectomy with ileal conduit. Prior left nephrectomy and adrenalectomy. Partial colectomy due to diverticulitis. * Tracking Code: BO * EXAM: CT CHEST, ABDOMEN, AND PELVIS WITH CONTRAST TECHNIQUE: Multidetector CT imaging of the chest, abdomen and pelvis was performed following the standard  protocol during bolus administration of intravenous contrast. RADIATION DOSE REDUCTION: This exam was performed according to the departmental dose-optimization program which includes automated exposure control, adjustment of the mA and/or kV according to patient size and/or use of iterative reconstruction technique. CONTRAST:  75mL OMNIPAQUE  IOHEXOL  300 MG/ML  SOLN COMPARISON:  12/10/2023, 10/21/2023 FINDINGS: CT CHEST FINDINGS Cardiovascular: Mild aortic atherosclerosis. Ascending thoracic aorta 3.9 cm in diameter, not formally qualifying as aneurysm. Moderate cardiomegaly, increased from 12/10/2023. AICD noted. Mediastinum/Nodes: Unremarkable Lungs/Pleura: Mild atelectasis medially in the left lower lobe. Musculoskeletal: Bilateral total shoulder arthroplasties. Thoracic spondylosis. CT ABDOMEN PELVIS FINDINGS Hepatobiliary: Numerous small layering gallstones in the gallbladder. Pancreas: Unremarkable Spleen: Unremarkable Adrenals/Urinary Tract: Right adrenal gland unremarkable. Right abdominal ileal conduit at the level of the iliac crests. No  overt hydronephrosis. Left nephrectomy and adrenalectomy. Cystoprostatectomy. Stomach/Bowel: As before, a right groin hernia contains loops of small bowel without current findings of strangulation or obstruction. Postoperative findings in the sigmoid colon with some residual sigmoid colon diverticulosis. Vascular/Lymphatic: Atherosclerosis is present, including aortoiliac atherosclerotic disease. Reproductive: Cystoprostatectomy. Other: No supplemental non-categorized findings. Musculoskeletal: Left lumbar hernia, image 71 series 2, no change. This contains adipose tissue. Loss of disc height and potentially partial fusion at L2-3. Lumbar spondylosis and degenerative disc disease. IMPRESSION: 1. No specific cause for sepsis is identified. 2. Moderate cardiomegaly, increased from 12/10/2023. 3. Cholelithiasis. 4. Cystoprostatectomy with right abdominal ileal conduit. Left  nephrectomy and adrenalectomy. 5. Right groin hernia contains loops of small bowel without current findings of strangulation or obstruction. 6. Left lumbar hernia contains adipose tissue. 7.  Aortic Atherosclerosis (ICD10-I70.0). Electronically Signed   By: Ryan Salvage M.D.   On: 12/24/2023 14:48   DG Chest Port 1 View if patient is in a treatment room. Result Date: 12/24/2023 CLINICAL DATA:  Sepsis.  Fever. EXAM: PORTABLE CHEST 1 VIEW COMPARISON:  December 10, 2023. FINDINGS: Stable cardiomegaly. Left-sided defibrillator is unchanged. Lungs are clear. Bilateral shoulder arthroplasties. IMPRESSION: No active disease. Electronically Signed   By: Lynwood Landy Raddle M.D.   On: 12/24/2023 09:54   DG Chest 2 View Result Date: 12/12/2023 CLINICAL DATA:  hx bladder cancer EXAM: CHEST - 2 VIEW COMPARISON:  10/21/2023. FINDINGS: Cardiac silhouette enlarged. No evidence of pneumothorax or pleural effusion. No evidence of pulmonary edema. No osseous abnormalities identified. Bilateral shoulder prostheses. Left-sided pacer. IMPRESSION: Enlarged cardiac silhouette.  Lungs are clear. Electronically Signed   By: Fonda Field M.D.   On: 12/12/2023 15:22   CUP PACEART REMOTE DEVICE CHECK Result Date: 12/02/2023 ICD: Scheduled remote reviewed. Normal device function.  Presenting rhythm: AS/BiVP Abnormal heart failure diagnostics this monitoring period Next remote 91 days. ML, CVRS   Alm Schneider, DO  [facility]  If 7PM-7AM, please contact night-coverage [email or web address] Password [email or web address] 12/25/2023, 4:40 PM   LOS: 1 day

## 2023-12-25 NOTE — Progress Notes (Signed)
   12/25/23 0321  Vitals  Temp (!) 101.4 F (38.6 C)  Temp Source Oral  BP (!) 156/86  MAP (mmHg) 105  BP Location Left Arm  BP Method Automatic  Patient Position (if appropriate) Lying  Pulse Rate 95  Pulse Rate Source Monitor  Resp 20  MEWS COLOR  MEWS Score Color Green  Oxygen  Therapy  SpO2 99 %  O2 Device CPAP  MEWS Score  MEWS Temp 1  MEWS Systolic 0  MEWS Pulse 0  MEWS RR 0  MEWS LOC 0  MEWS Score 1    Tylenol  given for 101.4 temp.

## 2023-12-25 NOTE — Plan of Care (Signed)

## 2023-12-25 NOTE — Progress Notes (Signed)
 Pharmacy Antibiotic Note  Billy Rasmussen is a 78 y.o. male admitted on 12/24/2023 with sepsis. Patient presented with AMS, weakness, and fever. Initially started on meropenem  due to history of multi-drug resistant UTIs (including ESBL organisms). Patient's fevers have trended down but still intermittently afebrile, WBC improving 12.6>10.6, blood cultures remain negative at <24 hours. Pharmacy has been consulted for vancomycin  dosing.   Plan: Give vancomycin  load of 2000 mg IV x1 Start vancomycin  750 mg IV every 24 hours (eAUC 441.1, Scr 1.57, IBW used, Vd 0.5 L/kg) Monitor renal function, clinical status, culture data, and LOT F/u MRSA PCR and de-escalate antibiotics as able  Height: 5' 6 (167.6 cm) Weight: 98.9 kg (218 lb) IBW/kg (Calculated) : 63.8  Temp (24hrs), Avg:100.1 F (37.8 C), Min:98.7 F (37.1 C), Max:103 F (39.4 C)  Recent Labs  Lab 12/24/23 0938 12/24/23 1124 12/25/23 0425  WBC 12.6*  --  10.6*  CREATININE 1.61*  --  1.57*  LATICACIDVEN 1.1 1.1  --     Estimated Creatinine Clearance: 44 mL/min (A) (by C-G formula based on SCr of 1.57 mg/dL (H)).    Allergies  Allergen Reactions   Ciprofloxacin  Itching    Antimicrobials this admission: meropenem  7/25 >>  vancomycin  7/26 >>  Cefepime  7/25 x1 Metronidazole  7/25 x1  Dose adjustments this admission: N/A  Microbiology results: 7/25 BCx: NG <24 hrs 7/25 UCx: pending  7/26 MRSA PCR: pending  Thank you for involving pharmacy in this patient's care.   Damien Napoleon, PharmD Clinical Pharmacist 12/25/2023 4:57 PM

## 2023-12-26 ENCOUNTER — Inpatient Hospital Stay (HOSPITAL_COMMUNITY)

## 2023-12-26 LAB — COMPREHENSIVE METABOLIC PANEL WITH GFR
ALT: 14 U/L (ref 0–44)
AST: 27 U/L (ref 15–41)
Albumin: 3.6 g/dL (ref 3.5–5.0)
Alkaline Phosphatase: 43 U/L (ref 38–126)
Anion gap: 9 (ref 5–15)
BUN: 32 mg/dL — ABNORMAL HIGH (ref 8–23)
CO2: 22 mmol/L (ref 22–32)
Calcium: 8.9 mg/dL (ref 8.9–10.3)
Chloride: 103 mmol/L (ref 98–111)
Creatinine, Ser: 1.57 mg/dL — ABNORMAL HIGH (ref 0.61–1.24)
GFR, Estimated: 45 mL/min — ABNORMAL LOW (ref >60)
Glucose, Bld: 101 mg/dL — ABNORMAL HIGH (ref 70–99)
Potassium: 3.7 mmol/L (ref 3.5–5.1)
Sodium: 134 mmol/L — ABNORMAL LOW (ref 135–145)
Total Bilirubin: 1.2 mg/dL (ref 0.0–1.2)
Total Protein: 7.2 g/dL (ref 6.5–8.1)

## 2023-12-26 LAB — MRSA NEXT GEN BY PCR, NASAL: MRSA by PCR Next Gen: NOT DETECTED

## 2023-12-26 LAB — CBC WITH DIFFERENTIAL/PLATELET
Abs Immature Granulocytes: 0.04 K/uL (ref 0.00–0.07)
Basophils Absolute: 0 K/uL (ref 0.0–0.1)
Basophils Relative: 0 %
Eosinophils Absolute: 0.1 K/uL (ref 0.0–0.5)
Eosinophils Relative: 1 %
HCT: 35.1 % — ABNORMAL LOW (ref 39.0–52.0)
Hemoglobin: 12 g/dL — ABNORMAL LOW (ref 13.0–17.0)
Immature Granulocytes: 1 %
Lymphocytes Relative: 11 %
Lymphs Abs: 0.9 K/uL (ref 0.7–4.0)
MCH: 32.6 pg (ref 26.0–34.0)
MCHC: 34.2 g/dL (ref 30.0–36.0)
MCV: 95.4 fL (ref 80.0–100.0)
Monocytes Absolute: 0.8 K/uL (ref 0.1–1.0)
Monocytes Relative: 11 %
Neutro Abs: 5.8 K/uL (ref 1.7–7.7)
Neutrophils Relative %: 76 %
Platelets: 104 K/uL — ABNORMAL LOW (ref 150–400)
RBC: 3.68 MIL/uL — ABNORMAL LOW (ref 4.22–5.81)
RDW: 14.6 % (ref 11.5–15.5)
WBC: 7.6 K/uL (ref 4.0–10.5)
nRBC: 0 % (ref 0.0–0.2)

## 2023-12-26 LAB — CK: Total CK: 25 U/L — ABNORMAL LOW (ref 49–397)

## 2023-12-26 LAB — MAGNESIUM: Magnesium: 2.2 mg/dL (ref 1.7–2.4)

## 2023-12-26 MED ORDER — DICLOFENAC SODIUM 1 % EX GEL
2.0000 g | Freq: Four times a day (QID) | CUTANEOUS | Status: DC
  Administered 2023-12-27 (×2): 2 g via TOPICAL

## 2023-12-26 NOTE — Progress Notes (Signed)
 PROGRESS NOTE  Billy Rasmussen FMW:[id number] DOB: [birth year] DOA: 12/24/2023 PCP: [name], DO  Brief History:  78 year old male with a history of with history of bladder cancer status post cystoprostatectomy with ileal conduit 10/2018, left nephrectomy (07/2019), HFrEF (09/2022, LVEF was <20% ) s/p CRT-D (01/2023), nonobstructive CAD LBBB, prior LV thrombus, pericarditis 05/2023, hypertension, HLD, CKD stage IIIa, thyroid  cancer status post thyroidectomy (04/2023) presenting with fever and confusion and generalized weakness with associated fevers and chills.   Notably, the patient has had numerous similar hospitalizations in the past. Most recently, he was admitted from 08/21/2023 to 08/26/2023 with fever and leukopenia with concerns of pyelonephritis.  At that time, there was also concern for tickborne infection.  He was discharged home with doxycycline .  He was readmitted to the hospital from 10/21/2023 to 10/25/2023.  During that hospitalization, he was treated for severe sepsis.  His urine culture grew out Pseudomonas and pantoea.  He was discharged with 7 more days of ciprofloxacin .  The patient had been in his usual state of health until he developed some chills around 10 PM on the evening of 12/23/2023.  There has been no recent complaints of headache, chest pain, dyspnea, nausea, vomiting, diarrhea, abdominal pain, dysuria, hematuria, hematochezia, and melena. The wife states that she noticed that the urine from his ileal conduit was yellowish in color which is not normal for him. On the morning of 12/24/2023, the patient was somnolent but arousable.  He had generalized weakness.  Wife took his temperature and it was 55 F.  EMS was activated.  In the ED, the patient was febrile up to 102.3 F.  He was tachycardic 110s, and hemodynamically stable.  Oxygen  saturation is 95-98% on room air.  WBC 12.6, hemoglobin 13.4, platelet 116.  Sodium 134, potassium 3.9, bicarbonate 19,  serum creatinine 1.61.      Assessment/Plan: Sepsis - Patient presented with fever, tachycardia, and leukocytosis - Secondary to urinary source -Patient has had a complex urologic history with complex urologic anatomy status post left nephrectomy 07/2019 secondary to recurrent pyelonephritis and ureteral stricture -Multiple previous MDR organisms including ESBL Klebsiella, Enterococcus, Morganella, and Pseudomonas -Respiratory panel - COVID/RSV/Flu-neg - CT chest--no pericarditis - CT AP--no acute findings.  Status post cystoprostatectomy with right ileal conduit and left nephrectomy and adrenalectomy - fevers trending down - follow blood culture--neg to date - follow urine culture-neg - PCT 0.58 - CT L-spine - CT T-spine - cannot perform MR at [facility] due to CRT-D   Hyponatremia - Secondary to volume depletion - Continue IV fluids>>saline lock   Chronic HFrEF --05/19/2023 echo EF 25 to 30%, grade 1 DD, mild decreased RVF -Clinically euvolemic -Continue metoprolol  succinate -holding losartan  and spironolactone  -Not on SGLT2 secondary to recurrent infections -BiV ICD insertion with Dr. Nancey on 03/01/2023.   Bladder cancer -Status post cystoprostatectomy with ileal conduit -Status post left nephrectomy secondary recurrent pyelonephritis -Follow-up Dr. Alvaro   Thyroid  cancer -Status post thyroidectomy 04/2023 -follow up Dr. Jesus -continue synthroid    Thrombocytopenia -Patient has had a history of thrombocytopenia in the past and follows Dr. Gatha -It was felt to be secondary to splenomegaly -Acute worsening secondary to infectious process -Monitor for signs of bleeding -improving with abx   Coronary artery disease -nonobstructive -LHC on 10/09/2022 showed nonobstructive CAD with 30% RCA stenosis -No chest pain presently -Continue statin   History of SVT -Currently stable with sinus rhythm -Zio patch x 10 days 10/2022  showed 1 episode of NSVT lasting 8 beats, 10  episodes of SVT with longest lasting 19 beats.    Aortic aneurysm:  Ascending aortic aneurysm measuring 45 mm on CTPA 08/2022 -12/24/23 CT chest -- Ascending thoracic aorta 3.9 cm in diameter   Class 2 obesity -BMI 35.19 -lifestyle modification           Family Communication:   spouse 7/26   Consultants:  none   Code Status:  FULL    DVT Prophylaxis:  [place] Heparin      Procedures: As Listed in Progress Note Above   Antibiotics: Merrem  7/25>> Vanc 7/25>>7/26     Subjective: Pt feeling better.  States back pain is a little better today.  Denies f/c, cp,sob, n/v/d  Objective: Vitals:   12/25/23 1623 12/25/23 1919 12/25/23 2339 12/26/23 0451  BP: (!) 149/73 124/67 124/75 (!) 143/79  Pulse: 96 80 80 73  Resp: (!) 21  18 (!) 22  Temp: 98.7 F (37.1 C) 98 F (36.7 C) 97.7 F (36.5 C) (!) 97.4 F (36.3 C)  TempSrc: Oral Oral Oral Axillary  SpO2: 97% 98% 97% 100%  Weight:      Height:        Intake/Output Summary (Last 24 hours) at 12/26/2023 0929 Last data filed at 12/26/2023 0600 Gross per 24 hour  Intake 718.57 ml  Output 900 ml  Net -181.43 ml   Weight change:  Exam:  General:  Pt is alert, follows commands appropriately, not in acute distress HEENT: No icterus, No thrush, No neck mass, Ward/AT Cardiovascular: RRR, S1/S2, no rubs, no gallops Respiratory: CTA bilaterally, no wheezing, no crackles, no rhonchi Abdomen: Soft/+BS, non tender, non distended, no guarding Extremities: No edema, No lymphangitis, No petechiae, No rashes, no synovitis;  negative straight leg raise   Data Reviewed: I have personally reviewed following labs and imaging studies Basic Metabolic Panel: Recent Labs  Lab 12/24/23 0938 12/25/23 0425 12/26/23 0504  NA 134* 134* 134*  K 3.9 4.1 3.7  CL 101 101 103  CO2 19* 21* 22  GLUCOSE 120* 121* 101*  BUN 29* 27* 32*  CREATININE 1.61* 1.57* 1.57*  CALCIUM  9.0 9.0 8.9  MG  --   --  2.2   Liver Function Tests: Recent Labs  Lab  12/24/23 0938 12/25/23 0425 12/26/23 0504  AST 34 29 27  ALT 18 15 14   ALKPHOS 58 49 43  BILITOT 1.5* 1.6* 1.2  PROT 8.0 7.6 7.2  ALBUMIN  4.2 3.8 3.6   No results for input(s): LIPASE, AMYLASE in the last 168 hours. No results for input(s): AMMONIA in the last 168 hours. Coagulation Profile: Recent Labs  Lab 12/24/23 0938  INR 1.2   CBC: Recent Labs  Lab 12/24/23 0938 12/25/23 0425 12/26/23 0504  WBC 12.6* 10.6* 7.6  NEUTROABS 11.1*  --  5.8  HGB 13.4 13.0 12.0*  HCT 38.4* 38.0* 35.1*  MCV 94.6 95.7 95.4  PLT 116* 90* 104*   Cardiac Enzymes: Recent Labs  Lab 12/26/23 0504  CKTOTAL 25*   BNP: Invalid input(s): POCBNP CBG: No results for input(s): GLUCAP in the last 168 hours. HbA1C: No results for input(s): HGBA1C in the last 72 hours. Urine analysis:    Component Value Date/Time   COLORURINE YELLOW 12/24/2023 0931   APPEARANCEUR CLEAR 12/24/2023 0931   APPEARANCEUR Clear 11/26/2022 0833   LABSPEC 1.015 12/24/2023 0931   PHURINE 6.0 12/24/2023 0931   GLUCOSEU NEGATIVE 12/24/2023 0931   HGBUR LARGE (A) 12/24/2023 0931  BILIRUBINUR NEGATIVE 12/24/2023 0931   BILIRUBINUR Negative 11/26/2022 0833   KETONESUR NEGATIVE 12/24/2023 0931   PROTEINUR 30 (A) 12/24/2023 0931   NITRITE NEGATIVE 12/24/2023 0931   LEUKOCYTESUR NEGATIVE 12/24/2023 0931   Sepsis Labs: @LABRCNTIP (procalcitonin:4,lacticidven:4) ) Recent Results (from the past 240 hours)  Culture, blood (Routine x 2)     Status: None (Preliminary result)   Collection Time: 12/24/23  9:38 AM   Specimen: BLOOD  Result Value Ref Range Status   Specimen Description BLOOD BLOOD RIGHT HAND  Final   Special Requests   Final    BOTTLES DRAWN AEROBIC AND ANAEROBIC Blood Culture adequate volume   Culture   Final    NO GROWTH 2 DAYS Performed at [facility], [address] [place], [place] [address]    Report Status PENDING  Incomplete  Resp panel by RT-PCR (RSV, Flu A&B, Covid) Anterior  Nasal Swab     Status: None   Collection Time: 12/24/23  9:50 AM   Specimen: Anterior Nasal Swab  Result Value Ref Range Status   SARS Coronavirus 2 by RT PCR NEGATIVE NEGATIVE Final    Comment: (NOTE) SARS-CoV-2 target nucleic acids are NOT DETECTED.  The SARS-CoV-2 RNA is generally detectable in upper respiratory specimens during the acute phase of infection. The lowest concentration of SARS-CoV-2 viral copies this assay can detect is 138 copies/mL. A negative result does not preclude SARS-Cov-2 infection and should not be used as the sole basis for treatment or other patient management decisions. A negative result may occur with  improper specimen collection/handling, submission of specimen other than nasopharyngeal swab, presence of viral mutation(s) within the areas targeted by this assay, and inadequate number of viral copies(<138 copies/mL). A negative result must be combined with clinical observations, patient history, and epidemiological information. The expected result is Negative.  Fact Sheet for Patients:  BloggerCourse.com  Fact Sheet for Healthcare Providers:  SeriousBroker.it  This test is no t yet approved or cleared by the [place]  FDA and  has been authorized for detection and/or diagnosis of SARS-CoV-2 by FDA under an Emergency Use Authorization (EUA). This EUA will remain  in effect (meaning this test can be used) for the duration of the COVID-19 declaration under Section 564(b)(1) of the Act, 21 U.S.C.section 360bbb-3(b)(1), unless the authorization is terminated  or revoked sooner.       Influenza A by PCR NEGATIVE NEGATIVE Final   Influenza B by PCR NEGATIVE NEGATIVE Final    Comment: (NOTE) The Xpert Xpress SARS-CoV-2/FLU/RSV plus assay is intended as an aid in the diagnosis of influenza from Nasopharyngeal swab specimens and should not be used as a sole basis for treatment. Nasal washings  and aspirates are unacceptable for Xpert Xpress SARS-CoV-2/FLU/RSV testing.  Fact Sheet for Patients: BloggerCourse.com  Fact Sheet for Healthcare Providers: SeriousBroker.it  This test is not yet approved or cleared by the [place]  FDA and has been authorized for detection and/or diagnosis of SARS-CoV-2 by FDA under an Emergency Use Authorization (EUA). This EUA will remain in effect (meaning this test can be used) for the duration of the COVID-19 declaration under Section 564(b)(1) of the Act, 21 U.S.C. section 360bbb-3(b)(1), unless the authorization is terminated or revoked.     Resp Syncytial Virus by PCR NEGATIVE NEGATIVE Final    Comment: (NOTE) Fact Sheet for Patients: BloggerCourse.com  Fact Sheet for Healthcare Providers: SeriousBroker.it  This test is not yet approved or cleared by the [place]  FDA and has been authorized for detection and/or  diagnosis of SARS-CoV-2 by FDA under an Emergency Use Authorization (EUA). This EUA will remain in effect (meaning this test can be used) for the duration of the COVID-19 declaration under Section 564(b)(1) of the Act, 21 U.S.C. section 360bbb-3(b)(1), unless the authorization is terminated or revoked.  Performed at [facility], [address], [place], [place] [address]   Culture, blood (Routine x 2)     Status: None (Preliminary result)   Collection Time: 12/24/23  9:51 AM   Specimen: BLOOD  Result Value Ref Range Status   Specimen Description BLOOD BLOOD RIGHT HAND  Final   Special Requests   Final    BOTTLES DRAWN AEROBIC AND ANAEROBIC Blood Culture adequate volume   Culture   Final    NO GROWTH 2 DAYS Performed at [facility], [address], [place], [place] [address]    Report Status PENDING  Incomplete  Urine Culture     Status: None   Collection Time: 12/24/23 12:06 PM   Specimen: Urine, Clean Catch   Result Value Ref Range Status   Specimen Description   Final    URINE, CLEAN CATCH Performed at [facility], [address] [place], [place] [address]    Special Requests   Final    NONE Performed at [facility], [address] [place], [place] [address]    Culture   Final    NO GROWTH Performed at [facility], [address] [place], [place] [address]    Report Status 12/25/2023 FINAL  Final  Respiratory (~20 pathogens) panel by PCR     Status: None   Collection Time: 12/25/23  4:05 AM   Specimen: Nasopharyngeal Swab; Respiratory  Result Value Ref Range Status   Adenovirus NOT DETECTED NOT DETECTED Final   Coronavirus 229E NOT DETECTED NOT DETECTED Final    Comment: (NOTE) The Coronavirus on the Respiratory Panel, DOES NOT test for the novel  Coronavirus (2019 nCoV)    Coronavirus HKU1 NOT DETECTED NOT DETECTED Final   Coronavirus NL63 NOT DETECTED NOT DETECTED Final   Coronavirus OC43 NOT DETECTED NOT DETECTED Final   Metapneumovirus NOT DETECTED NOT DETECTED Final   Rhinovirus / Enterovirus NOT DETECTED NOT DETECTED Final   Influenza A NOT DETECTED NOT DETECTED Final   Influenza B NOT DETECTED NOT DETECTED Final   Parainfluenza Virus 1 NOT DETECTED NOT DETECTED Final   Parainfluenza Virus 2 NOT DETECTED NOT DETECTED Final   Parainfluenza Virus 3 NOT DETECTED NOT DETECTED Final   Parainfluenza Virus 4 NOT DETECTED NOT DETECTED Final   Respiratory Syncytial Virus NOT DETECTED NOT DETECTED Final   Bordetella pertussis NOT DETECTED NOT DETECTED Final   Bordetella Parapertussis NOT DETECTED NOT DETECTED Final   Chlamydophila pneumoniae NOT DETECTED NOT DETECTED Final   Mycoplasma pneumoniae NOT DETECTED NOT DETECTED Final    Comment: Performed at [facility], [address], [place], [place] [address]  MRSA Next Gen by PCR, Nasal     Status: None   Collection Time: 12/26/23  6:50 AM   Specimen: Nasal Mucosa; Nasal Swab  Result Value Ref Range Status    MRSA by PCR Next Gen NOT DETECTED NOT DETECTED Final    Comment: (NOTE) The GeneXpert MRSA Assay (FDA approved for NASAL specimens only), is one component of a comprehensive MRSA colonization surveillance program. It is not intended to diagnose MRSA infection nor to guide or monitor treatment for MRSA infections. Test performance is not FDA approved in patients less than  79 years old. Performed at [facility], [address] [place], [place] [address]      Scheduled Meds:  heparin   5,000 Units Subcutaneous Q8H   levothyroxine   112 mcg Oral q morning   metoprolol  succinate  25 mg Oral Daily   multivitamin with minerals  1 tablet Oral Daily   Continuous Infusions:  meropenem  (MERREM ) IV 1 g (12/26/23 0545)   vancomycin       Procedures/Studies: CT CHEST ABDOMEN PELVIS W CONTRAST Result Date: 12/24/2023 CLINICAL DATA:  Parathyroid and bladder cancer. Sepsis, fever. Prior cystoprostatectomy with ileal conduit. Prior left nephrectomy and adrenalectomy. Partial colectomy due to diverticulitis. * Tracking Code: BO * EXAM: CT CHEST, ABDOMEN, AND PELVIS WITH CONTRAST TECHNIQUE: Multidetector CT imaging of the chest, abdomen and pelvis was performed following the standard protocol during bolus administration of intravenous contrast. RADIATION DOSE REDUCTION: This exam was performed according to the departmental dose-optimization program which includes automated exposure control, adjustment of the mA and/or kV according to patient size and/or use of iterative reconstruction technique. CONTRAST:  75mL OMNIPAQUE  IOHEXOL  300 MG/ML  SOLN COMPARISON:  12/10/2023, 10/21/2023 FINDINGS: CT CHEST FINDINGS Cardiovascular: Mild aortic atherosclerosis. Ascending thoracic aorta 3.9 cm in diameter, not formally qualifying as aneurysm. Moderate cardiomegaly, increased from 12/10/2023. AICD noted. Mediastinum/Nodes: Unremarkable Lungs/Pleura: Mild atelectasis medially in the left lower lobe. Musculoskeletal: Bilateral  total shoulder arthroplasties. Thoracic spondylosis. CT ABDOMEN PELVIS FINDINGS Hepatobiliary: Numerous small layering gallstones in the gallbladder. Pancreas: Unremarkable Spleen: Unremarkable Adrenals/Urinary Tract: Right adrenal gland unremarkable. Right abdominal ileal conduit at the level of the iliac crests. No overt hydronephrosis. Left nephrectomy and adrenalectomy. Cystoprostatectomy. Stomach/Bowel: As before, a right groin hernia contains loops of small bowel without current findings of strangulation or obstruction. Postoperative findings in the sigmoid colon with some residual sigmoid colon diverticulosis. Vascular/Lymphatic: Atherosclerosis is present, including aortoiliac atherosclerotic disease. Reproductive: Cystoprostatectomy. Other: No supplemental non-categorized findings. Musculoskeletal: Left lumbar hernia, image 71 series 2, no change. This contains adipose tissue. Loss of disc height and potentially partial fusion at L2-3. Lumbar spondylosis and degenerative disc disease. IMPRESSION: 1. No specific cause for sepsis is identified. 2. Moderate cardiomegaly, increased from 12/10/2023. 3. Cholelithiasis. 4. Cystoprostatectomy with right abdominal ileal conduit. Left nephrectomy and adrenalectomy. 5. Right groin hernia contains loops of small bowel without current findings of strangulation or obstruction. 6. Left lumbar hernia contains adipose tissue. 7.  Aortic Atherosclerosis (ICD10-I70.0). Electronically Signed   By: Ryan Salvage M.D.   On: 12/24/2023 14:48   DG Chest Port 1 View if patient is in a treatment room. Result Date: 12/24/2023 CLINICAL DATA:  Sepsis.  Fever. EXAM: PORTABLE CHEST 1 VIEW COMPARISON:  December 10, 2023. FINDINGS: Stable cardiomegaly. Left-sided defibrillator is unchanged. Lungs are clear. Bilateral shoulder arthroplasties. IMPRESSION: No active disease. Electronically Signed   By: Lynwood Landy Raddle M.D.   On: 12/24/2023 09:54   DG Chest 2 View Result Date:  12/12/2023 CLINICAL DATA:  hx bladder cancer EXAM: CHEST - 2 VIEW COMPARISON:  10/21/2023. FINDINGS: Cardiac silhouette enlarged. No evidence of pneumothorax or pleural effusion. No evidence of pulmonary edema. No osseous abnormalities identified. Bilateral shoulder prostheses. Left-sided pacer. IMPRESSION: Enlarged cardiac silhouette.  Lungs are clear. Electronically Signed   By: Fonda Field M.D.   On: 12/12/2023 15:22   CUP PACEART REMOTE DEVICE CHECK Result Date: 12/02/2023 ICD: Scheduled remote reviewed. Normal device function.  Presenting rhythm: AS/BiVP Abnormal heart failure diagnostics this monitoring period Next remote 91 days. ML, CVRS   Alm Nevaya Nagele, DO  [facility]  If 7PM-7AM, please contact night-coverage [email or web address] Password [email or web address] 12/26/2023, 9:29 AM   LOS: 2 days

## 2023-12-26 NOTE — Plan of Care (Signed)

## 2023-12-26 NOTE — Plan of Care (Signed)

## 2023-12-26 NOTE — Discharge Summary (Signed)
 Physician Discharge Summary   Patient: Billy Rasmussen MRN: [id number] DOB: [birth year]  Admit date:     12/24/2023  Discharge date: 12/27/2023  Discharge Physician: Alm Annaliese Saez   PCP: [name], DO   Recommendations at discharge:   Please follow up with primary care provider within 1-2 weeks  Please repeat BMP and CBC in one week     Hospital Course: 78 year old male with a history of with history of bladder cancer status post cystoprostatectomy with ileal conduit 10/2018, left nephrectomy (07/2019), HFrEF (09/2022, LVEF was <20% ) s/p CRT-D (01/2023), nonobstructive CAD LBBB, prior LV thrombus, pericarditis 05/2023, hypertension, HLD, CKD stage IIIa, thyroid  cancer status post thyroidectomy (04/2023) presenting with fever and confusion and generalized weakness with associated fevers and chills.   Notably, the patient has had numerous similar hospitalizations in the past. Most recently, he was admitted from 08/21/2023 to 08/26/2023 with fever and leukopenia with concerns of pyelonephritis.  At that time, there was also concern for tickborne infection.  He was discharged home with doxycycline .  He was readmitted to the hospital from 10/21/2023 to 10/25/2023.  During that hospitalization, he was treated for severe sepsis.  His urine culture grew out Pseudomonas and pantoea.  He was discharged with 7 more days of ciprofloxacin .  The patient had been in his usual state of health until he developed some chills around 10 PM on the evening of 12/23/2023.  There has been no recent complaints of headache, chest pain, dyspnea, nausea, vomiting, diarrhea, abdominal pain, dysuria, hematuria, hematochezia, and melena. The wife states that she noticed that the urine from his ileal conduit was yellowish in color which is not normal for him. On the morning of 12/24/2023, the patient was somnolent but arousable.  He had generalized weakness.  Wife took his temperature and it was 70 F.  EMS was  activated.  In the ED, the patient was febrile up to 102.3 F.  He was tachycardic 110s, and hemodynamically stable.  Oxygen  saturation is 95-98% on room air.  WBC 12.6, hemoglobin 13.4, platelet 116.  Sodium 134, potassium 3.9, bicarbonate 19, serum creatinine 1.61. He gradually improved and defervesced.  His antibiotics were narrowed.  Although, his cultures remained neg, his working diagnosis was urinary source sepsis given his complicated urologic history.      Assessment and Plan: Sepsis - Patient presented with fever, tachycardia, and leukocytosis - Secondary to urinary source -Patient has had a complex urologic history with complex urologic anatomy status post left nephrectomy 07/2019 secondary to recurrent pyelonephritis and ureteral stricture -Multiple previous MDR organisms including ESBL Klebsiella, Enterococcus, Morganella, and Pseudomonas -Respiratory panel--neg - COVID/RSV/Flu-neg - CT chest--no pericarditis - CT AP--no acute findings.  Status post cystoprostatectomy with right ileal conduit and left nephrectomy and adrenalectomy - sepsis physiology resolved > 24 hours - follow blood culture--neg to date - follow urine culture-neg - PCT 0.58 - CT L-spine--negative for abscess/osteomyelitis - CT T-spine--negative for abscess/osteomyelitis - cannot perform MR at [facility] due to CRT-D - plan to d/c home with amox/clav + doxy x 4 more days   Hyponatremia - Secondary to volume depletion - Continue IV fluids>>saline lock   Chronic HFrEF --05/19/2023 echo EF 25 to 30%, grade 1 DD, mild decreased RVF -Clinically euvolemic -Continue metoprolol  succinate -holding losartan  and spironolactone  -Not on SGLT2 secondary to recurrent infections -BiV ICD insertion with Dr. Nancey on 03/01/2023.   Bladder cancer -Status post cystoprostatectomy with ileal conduit -Status post left nephrectomy secondary recurrent pyelonephritis -Follow-up Dr. Alvaro  Thyroid  cancer -Status post  thyroidectomy 04/2023 -follow up Dr. Jesus -continue synthroid    Thrombocytopenia -Patient has had a history of thrombocytopenia in the past and follows Dr. Gatha -It was felt to be secondary to splenomegaly -Acute worsening secondary to infectious process -Monitor for signs of bleeding -improving with abx   Coronary artery disease -nonobstructive -LHC on 10/09/2022 showed nonobstructive CAD with 30% RCA stenosis -No chest pain presently -Continue statin   History of SVT -Currently stable with sinus rhythm -Zio patch x 10 days 10/2022 showed 1 episode of NSVT lasting 8 beats, 10 episodes of SVT with longest lasting 19 beats.    Aortic aneurysm:  Ascending aortic aneurysm measuring 45 mm on CTPA 08/2022 -12/24/23 CT chest -- Ascending thoracic aorta 3.9 cm in diameter   Class 2 obesity -BMI 35.19 -lifestyle modification        Consultants: none Procedures performed: none  Disposition: Home Diet recommendation:  Cardiac diet DISCHARGE MEDICATION: Allergies as of 12/27/2023       Reactions   Ciprofloxacin  Itching        Medication List     TAKE these medications    Albuterol -Budesonide  90-80 MCG/ACT Aero Take 2 puffs by mouth 4 (four) times daily as needed (shortness of breath.).   amoxicillin -clavulanate 875-125 MG tablet Commonly known as: AUGMENTIN  Take 1 tablet by mouth every 12 (twelve) hours.   artificial tears ophthalmic solution Place 1 drop into both eyes as needed for dry eyes.   doxycycline  100 MG tablet Commonly known as: VIBRA -TABS Take 1 tablet (100 mg total) by mouth every 12 (twelve) hours.   furosemide  20 MG tablet Commonly known as: LASIX  Take 1 tablet (20 mg total) by mouth See admin instructions. Take 1 tablet (20 mg) daily as needed if you gain more than 3 pounds in 1 day or 5 pounds in 1 week.   levothyroxine  112 MCG tablet Commonly known as: SYNTHROID  Take 112 mcg by mouth every morning.   losartan  25 MG tablet Commonly  known as: COZAAR  Take 1 tablet (25 mg total) by mouth daily. What changed:  how much to take additional instructions   metoprolol  succinate 50 MG 24 hr tablet Commonly known as: TOPROL -XL TAKE 1 TABLET BY MOUTH EVERY DAY What changed: how much to take   multivitamin capsule Take 1 capsule by mouth daily.   spironolactone  25 MG tablet Commonly known as: ALDACTONE  Take 12.5 mg by mouth daily.        Discharge Exam: Filed Weights   12/24/23 0927  Weight: 98.9 kg   HEENT:  [place]/AT, No thrush, no icterus CV:  RRR, no rub, no S3, no S4 Lung:  CTA, no wheeze, no rhonchi Abd:  soft/+BS, NT Ext:  No edema, no lymphangitis, no synovitis, no rash   Condition at discharge: stable  The results of significant diagnostics from this hospitalization (including imaging, microbiology, ancillary and laboratory) are listed below for reference.   Imaging Studies: CT THORACIC SPINE WO CONTRAST Result Date: 12/26/2023 CLINICAL DATA:  Mid-back pain, infection suspected, no prior imaging mid back pain with fever; low back pain with fever, leukocytosis. EXAM: CT THORACIC, AND LUMBAR SPINE WITHOUT CONTRAST TECHNIQUE: Multidetector CT imaging of the thoracic and lumbar spine was performed without intravenous contrast. Multiplanar CT image reconstructions were also generated. RADIATION DOSE REDUCTION: This exam was performed according to the departmental dose-optimization program which includes automated exposure control, adjustment of the mA and/or kV according to patient size and/or use of iterative reconstruction technique. COMPARISON:  None Available. FINDINGS: CT THORACIC SPINE FINDINGS Alignment: Straightening. Vertebrae: No convincing evidence of acute fracture or or obvious discitis/osteomyelitis. Mild deformity of multiple endplates appears chronic and likely represents degenerative Schmorl's nodes. No erosive change. Paraspinal and other soft tissues: Unremarkable paraspinal soft tissues. Please see  recent CT of the chest/abdomen/pelvis for findings in the chest/abdomen/pelvis. Disc levels: Moderate multilevel degenerative change including degenerative Schmorl's nodes. No evidence of high-grade bony canal or foraminal stenosis. CT LUMBAR SPINE FINDINGS Alignment: No substantial subluxation. Vertebrae: Disc height loss and endplate sclerosis at L2-L3 is compatible with degenerative changes, particularly given similar appearance on Oct 12, 2022 CT renal study. No convincing evidence of discitis/osteomyelitis or acute fracture. Paraspinal and other soft tissues: No acute paraspinal abnormality. Please see recent CT of the chest/abdomen/pelvis for findings in the chest/abdomen/pelvis. Disc levels: Multilevel degenerative change clean severe degenerative disc disease at L2-L3 and moderate degenerative disease at L1-L2. IMPRESSION: 1. No convincing evidence of acute fracture or discitis/osteomyelitis by noncontrast CT. If the patient is able, an MRI with contrast could provide more sensitive evaluation. 2. Multilevel degenerative change, greatest at L2-L3 where there is severe chronic degenerative disc disease. No evidence of high-grade canal or foraminal stenosis. Electronically Signed   By: Gilmore GORMAN Molt M.D.   On: 12/26/2023 21:05   CT LUMBAR SPINE WO CONTRAST Result Date: 12/26/2023 CLINICAL DATA:  Mid-back pain, infection suspected, no prior imaging mid back pain with fever; low back pain with fever, leukocytosis. EXAM: CT THORACIC, AND LUMBAR SPINE WITHOUT CONTRAST TECHNIQUE: Multidetector CT imaging of the thoracic and lumbar spine was performed without intravenous contrast. Multiplanar CT image reconstructions were also generated. RADIATION DOSE REDUCTION: This exam was performed according to the departmental dose-optimization program which includes automated exposure control, adjustment of the mA and/or kV according to patient size and/or use of iterative reconstruction technique. COMPARISON:  None  Available. FINDINGS: CT THORACIC SPINE FINDINGS Alignment: Straightening. Vertebrae: No convincing evidence of acute fracture or or obvious discitis/osteomyelitis. Mild deformity of multiple endplates appears chronic and likely represents degenerative Schmorl's nodes. No erosive change. Paraspinal and other soft tissues: Unremarkable paraspinal soft tissues. Please see recent CT of the chest/abdomen/pelvis for findings in the chest/abdomen/pelvis. Disc levels: Moderate multilevel degenerative change including degenerative Schmorl's nodes. No evidence of high-grade bony canal or foraminal stenosis. CT LUMBAR SPINE FINDINGS Alignment: No substantial subluxation. Vertebrae: Disc height loss and endplate sclerosis at L2-L3 is compatible with degenerative changes, particularly given similar appearance on Oct 12, 2022 CT renal study. No convincing evidence of discitis/osteomyelitis or acute fracture. Paraspinal and other soft tissues: No acute paraspinal abnormality. Please see recent CT of the chest/abdomen/pelvis for findings in the chest/abdomen/pelvis. Disc levels: Multilevel degenerative change clean severe degenerative disc disease at L2-L3 and moderate degenerative disease at L1-L2. IMPRESSION: 1. No convincing evidence of acute fracture or discitis/osteomyelitis by noncontrast CT. If the patient is able, an MRI with contrast could provide more sensitive evaluation. 2. Multilevel degenerative change, greatest at L2-L3 where there is severe chronic degenerative disc disease. No evidence of high-grade canal or foraminal stenosis. Electronically Signed   By: Gilmore GORMAN Molt M.D.   On: 12/26/2023 21:05   CT CHEST ABDOMEN PELVIS W CONTRAST Result Date: 12/24/2023 CLINICAL DATA:  Parathyroid and bladder cancer. Sepsis, fever. Prior cystoprostatectomy with ileal conduit. Prior left nephrectomy and adrenalectomy. Partial colectomy due to diverticulitis. * Tracking Code: BO * EXAM: CT CHEST, ABDOMEN, AND PELVIS WITH  CONTRAST TECHNIQUE: Multidetector CT imaging of the chest, abdomen and pelvis was performed  following the standard protocol during bolus administration of intravenous contrast. RADIATION DOSE REDUCTION: This exam was performed according to the departmental dose-optimization program which includes automated exposure control, adjustment of the mA and/or kV according to patient size and/or use of iterative reconstruction technique. CONTRAST:  75mL OMNIPAQUE  IOHEXOL  300 MG/ML  SOLN COMPARISON:  12/10/2023, 10/21/2023 FINDINGS: CT CHEST FINDINGS Cardiovascular: Mild aortic atherosclerosis. Ascending thoracic aorta 3.9 cm in diameter, not formally qualifying as aneurysm. Moderate cardiomegaly, increased from 12/10/2023. AICD noted. Mediastinum/Nodes: Unremarkable Lungs/Pleura: Mild atelectasis medially in the left lower lobe. Musculoskeletal: Bilateral total shoulder arthroplasties. Thoracic spondylosis. CT ABDOMEN PELVIS FINDINGS Hepatobiliary: Numerous small layering gallstones in the gallbladder. Pancreas: Unremarkable Spleen: Unremarkable Adrenals/Urinary Tract: Right adrenal gland unremarkable. Right abdominal ileal conduit at the level of the iliac crests. No overt hydronephrosis. Left nephrectomy and adrenalectomy. Cystoprostatectomy. Stomach/Bowel: As before, a right groin hernia contains loops of small bowel without current findings of strangulation or obstruction. Postoperative findings in the sigmoid colon with some residual sigmoid colon diverticulosis. Vascular/Lymphatic: Atherosclerosis is present, including aortoiliac atherosclerotic disease. Reproductive: Cystoprostatectomy. Other: No supplemental non-categorized findings. Musculoskeletal: Left lumbar hernia, image 71 series 2, no change. This contains adipose tissue. Loss of disc height and potentially partial fusion at L2-3. Lumbar spondylosis and degenerative disc disease. IMPRESSION: 1. No specific cause for sepsis is identified. 2. Moderate cardiomegaly,  increased from 12/10/2023. 3. Cholelithiasis. 4. Cystoprostatectomy with right abdominal ileal conduit. Left nephrectomy and adrenalectomy. 5. Right groin hernia contains loops of small bowel without current findings of strangulation or obstruction. 6. Left lumbar hernia contains adipose tissue. 7.  Aortic Atherosclerosis (ICD10-I70.0). Electronically Signed   By: Ryan Salvage M.D.   On: 12/24/2023 14:48   DG Chest Port 1 View if patient is in a treatment room. Result Date: 12/24/2023 CLINICAL DATA:  Sepsis.  Fever. EXAM: PORTABLE CHEST 1 VIEW COMPARISON:  December 10, 2023. FINDINGS: Stable cardiomegaly. Left-sided defibrillator is unchanged. Lungs are clear. Bilateral shoulder arthroplasties. IMPRESSION: No active disease. Electronically Signed   By: Lynwood Landy Raddle M.D.   On: 12/24/2023 09:54   DG Chest 2 View Result Date: 12/12/2023 CLINICAL DATA:  hx bladder cancer EXAM: CHEST - 2 VIEW COMPARISON:  10/21/2023. FINDINGS: Cardiac silhouette enlarged. No evidence of pneumothorax or pleural effusion. No evidence of pulmonary edema. No osseous abnormalities identified. Bilateral shoulder prostheses. Left-sided pacer. IMPRESSION: Enlarged cardiac silhouette.  Lungs are clear. Electronically Signed   By: Fonda Field M.D.   On: 12/12/2023 15:22   CUP PACEART REMOTE DEVICE CHECK Result Date: 12/02/2023 ICD: Scheduled remote reviewed. Normal device function.  Presenting rhythm: AS/BiVP Abnormal heart failure diagnostics this monitoring period Next remote 91 days. ML, CVRS   Microbiology: Results for orders placed or performed during the hospital encounter of 12/24/23  Culture, blood (Routine x 2)     Status: None (Preliminary result)   Collection Time: 12/24/23  9:38 AM   Specimen: BLOOD  Result Value Ref Range Status   Specimen Description BLOOD BLOOD RIGHT HAND  Final   Special Requests   Final    BOTTLES DRAWN AEROBIC AND ANAEROBIC Blood Culture adequate volume   Culture   Final    NO GROWTH  3 DAYS Performed at [facility], [address], [place], [place] [address]    Report Status PENDING  Incomplete  Resp panel by RT-PCR (RSV, Flu A&B, Covid) Anterior Nasal Swab     Status: None   Collection Time: 12/24/23  9:50 AM   Specimen: Anterior Nasal Swab  Result Value Ref Range Status   SARS Coronavirus 2 by RT PCR NEGATIVE NEGATIVE Final    Comment: (NOTE) SARS-CoV-2 target nucleic acids are NOT DETECTED.  The SARS-CoV-2 RNA is generally detectable in upper respiratory specimens during the acute phase of infection. The lowest concentration of SARS-CoV-2 viral copies this assay can detect is 138 copies/mL. A negative result does not preclude SARS-Cov-2 infection and should not be used as the sole basis for treatment or other patient management decisions. A negative result may occur with  improper specimen collection/handling, submission of specimen other than nasopharyngeal swab, presence of viral mutation(s) within the areas targeted by this assay, and inadequate number of viral copies(<138 copies/mL). A negative result must be combined with clinical observations, patient history, and epidemiological information. The expected result is Negative.  Fact Sheet for Patients:  BloggerCourse.com  Fact Sheet for Healthcare Providers:  SeriousBroker.it  This test is no t yet approved or cleared by the [place]  FDA and  has been authorized for detection and/or diagnosis of SARS-CoV-2 by FDA under an Emergency Use Authorization (EUA). This EUA will remain  in effect (meaning this test can be used) for the duration of the COVID-19 declaration under Section 564(b)(1) of the Act, 21 U.S.C.section 360bbb-3(b)(1), unless the authorization is terminated  or revoked sooner.       Influenza A by PCR NEGATIVE NEGATIVE Final   Influenza B by PCR NEGATIVE NEGATIVE Final    Comment: (NOTE) The Xpert Xpress SARS-CoV-2/FLU/RSV plus  assay is intended as an aid in the diagnosis of influenza from Nasopharyngeal swab specimens and should not be used as a sole basis for treatment. Nasal washings and aspirates are unacceptable for Xpert Xpress SARS-CoV-2/FLU/RSV testing.  Fact Sheet for Patients: BloggerCourse.com  Fact Sheet for Healthcare Providers: SeriousBroker.it  This test is not yet approved or cleared by the [place]  FDA and has been authorized for detection and/or diagnosis of SARS-CoV-2 by FDA under an Emergency Use Authorization (EUA). This EUA will remain in effect (meaning this test can be used) for the duration of the COVID-19 declaration under Section 564(b)(1) of the Act, 21 U.S.C. section 360bbb-3(b)(1), unless the authorization is terminated or revoked.     Resp Syncytial Virus by PCR NEGATIVE NEGATIVE Final    Comment: (NOTE) Fact Sheet for Patients: BloggerCourse.com  Fact Sheet for Healthcare Providers: SeriousBroker.it  This test is not yet approved or cleared by the [place]  FDA and has been authorized for detection and/or diagnosis of SARS-CoV-2 by FDA under an Emergency Use Authorization (EUA). This EUA will remain in effect (meaning this test can be used) for the duration of the COVID-19 declaration under Section 564(b)(1) of the Act, 21 U.S.C. section 360bbb-3(b)(1), unless the authorization is terminated or revoked.  Performed at [facility], [address] [place], [place] [address]   Culture, blood (Routine x 2)     Status: None (Preliminary result)   Collection Time: 12/24/23  9:51 AM   Specimen: BLOOD  Result Value Ref Range Status   Specimen Description BLOOD BLOOD RIGHT HAND  Final   Special Requests   Final    BOTTLES DRAWN AEROBIC AND ANAEROBIC Blood Culture adequate volume   Culture   Final    NO GROWTH 3 DAYS Performed at [facility], [address]  [place], [place] [address]    Report Status PENDING  Incomplete  Urine Culture     Status: None   Collection Time: 12/24/23 12:06 PM   Specimen: Urine, Clean  Catch  Result Value Ref Range Status   Specimen Description   Final    URINE, CLEAN CATCH Performed at [facility], [address], [place], [place] [address]    Special Requests   Final    NONE Performed at [facility], [address] [place], [place] [address]    Culture   Final    NO GROWTH Performed at [facility], [address] [place], [place] [address]    Report Status 12/25/2023 FINAL  Final  Respiratory (~20 pathogens) panel by PCR     Status: None   Collection Time: 12/25/23  4:05 AM   Specimen: Nasopharyngeal Swab; Respiratory  Result Value Ref Range Status   Adenovirus NOT DETECTED NOT DETECTED Final   Coronavirus 229E NOT DETECTED NOT DETECTED Final    Comment: (NOTE) The Coronavirus on the Respiratory Panel, DOES NOT test for the novel  Coronavirus (2019 nCoV)    Coronavirus HKU1 NOT DETECTED NOT DETECTED Final   Coronavirus NL63 NOT DETECTED NOT DETECTED Final   Coronavirus OC43 NOT DETECTED NOT DETECTED Final   Metapneumovirus NOT DETECTED NOT DETECTED Final   Rhinovirus / Enterovirus NOT DETECTED NOT DETECTED Final   Influenza A NOT DETECTED NOT DETECTED Final   Influenza B NOT DETECTED NOT DETECTED Final   Parainfluenza Virus 1 NOT DETECTED NOT DETECTED Final   Parainfluenza Virus 2 NOT DETECTED NOT DETECTED Final   Parainfluenza Virus 3 NOT DETECTED NOT DETECTED Final   Parainfluenza Virus 4 NOT DETECTED NOT DETECTED Final   Respiratory Syncytial Virus NOT DETECTED NOT DETECTED Final   Bordetella pertussis NOT DETECTED NOT DETECTED Final   Bordetella Parapertussis NOT DETECTED NOT DETECTED Final   Chlamydophila pneumoniae NOT DETECTED NOT DETECTED Final   Mycoplasma pneumoniae NOT DETECTED NOT DETECTED Final    Comment: Performed at [facility], [address] [place], [place]  [address]  MRSA Next Gen by PCR, Nasal     Status: None   Collection Time: 12/26/23  6:50 AM   Specimen: Nasal Mucosa; Nasal Swab  Result Value Ref Range Status   MRSA by PCR Next Gen NOT DETECTED NOT DETECTED Final    Comment: (NOTE) The GeneXpert MRSA Assay (FDA approved for NASAL specimens only), is one component of a comprehensive MRSA colonization surveillance program. It is not intended to diagnose MRSA infection nor to guide or monitor treatment for MRSA infections. Test performance is not FDA approved in patients less than 84 years old. Performed at [facility], [address] [place], [place] [address]     Labs: CBC: Recent Labs  Lab 12/24/23 [phone] 12/25/23 0425 12/26/23 0504  WBC 12.6* 10.6* 7.6  NEUTROABS 11.1*  --  5.8  HGB 13.4 13.0 12.0*  HCT 38.4* 38.0* 35.1*  MCV 94.6 95.7 95.4  PLT 116* 90* 104*   Basic Metabolic Panel: Recent Labs  Lab 12/24/23 0938 12/25/23 0425 12/26/23 0504  NA 134* 134* 134*  K 3.9 4.1 3.7  CL 101 101 103  CO2 19* 21* 22  GLUCOSE 120* 121* 101*  BUN 29* 27* 32*  CREATININE 1.61* 1.57* 1.57*  CALCIUM  9.0 9.0 8.9  MG  --   --  2.2   Liver Function Tests: Recent Labs  Lab 12/24/23 0938 12/25/23 0425 12/26/23 0504  AST 34 29 27  ALT 18 15 14   ALKPHOS 58 49 43  BILITOT 1.5* 1.6* 1.2  PROT 8.0 7.6 7.2  ALBUMIN  4.2 3.8 3.6   CBG: No  results for input(s): GLUCAP in the last 168 hours.  Discharge time spent: greater than 30 minutes.  Signed: Alm Schneider, MD [facility] 12/27/2023

## 2023-12-27 MED ORDER — DOXYCYCLINE HYCLATE 100 MG PO TABS
100.0000 mg | ORAL_TABLET | Freq: Two times a day (BID) | ORAL | Status: DC
  Administered 2023-12-27: 100 mg via ORAL
  Filled 2023-12-27: qty 1

## 2023-12-27 MED ORDER — DOXYCYCLINE HYCLATE 100 MG PO TABS: 100.0000 mg | ORAL_TABLET | Freq: Two times a day (BID) | ORAL | 0 refills | Status: DC

## 2023-12-27 MED ORDER — FOSFOMYCIN TROMETHAMINE 3 G PO PACK
3.0000 g | PACK | Freq: Once | ORAL | Status: AC
  Administered 2023-12-27: 3 g via ORAL
  Filled 2023-12-27: qty 3

## 2023-12-27 MED ORDER — AMOXICILLIN-POT CLAVULANATE 875-125 MG PO TABS: 1.0000 | ORAL_TABLET | Freq: Two times a day (BID) | ORAL | 0 refills | Status: DC

## 2023-12-27 MED ORDER — AMOXICILLIN-POT CLAVULANATE 875-125 MG PO TABS
1.0000 | ORAL_TABLET | Freq: Two times a day (BID) | ORAL | Status: DC
  Administered 2023-12-27: 1 via ORAL
  Filled 2023-12-27: qty 1

## 2023-12-27 NOTE — Progress Notes (Signed)
 Patient has discharge orders, discharge instructions given and no further questions at this time, awaiting arrival of patients wife for pick up

## 2023-12-27 NOTE — Plan of Care (Signed)

## 2023-12-27 NOTE — Progress Notes (Signed)
   12/27/23 1001  TOC Brief Assessment  Insurance and Status Reviewed  Patient has primary care physician Yes  Home environment has been reviewed Home w/ spouse  Prior level of function: Independent  Prior/Current Home Services No current home services  Social Drivers of Health Review SDOH reviewed no interventions necessary  Readmission risk has been reviewed Yes  Transition of care needs no transition of care needs at this time

## 2023-12-28 ENCOUNTER — Telehealth: Payer: Self-pay | Admitting: *Deleted

## 2023-12-28 NOTE — Transitions of Care (Post Inpatient/ED Visit) (Signed)
 12/28/2023  Name: Billy Rasmussen MRN: [id number] DOB: 1946-05-20  Today's TOC FU Call Status: Today's TOC FU Call Status:: Successful TOC FU Call Completed TOC FU Call Complete Date: 12/28/23 Patient's Name and Date of Birth confirmed.  Transition Care Management Follow-up Telephone Call Date of Discharge: 12/27/23 [facility] Type of Discharge: Inpatient Admission Primary Inpatient Discharge Diagnosis:: Sepsis secondary to UTI How have you been since you were released from the hospital?: Better (feel better but still weak, appetite is coming back, regular bowel movements) Any questions or concerns?: No  Items Reviewed: Did you receive and understand the discharge instructions provided?: Yes Medications obtained,verified, and reconciled?: Yes (Medications Reviewed) Any new allergies since your discharge?: No Dietary orders reviewed?: Yes Type of Diet Ordered:: heart healthy Do you have support at home?: Yes People in Home [RPT]: spouse Name of Support/Comfort Primary Source: Bladen Umar Routed today's note to primary care provider  Medications Reviewed Today: Medications Reviewed Today     Reviewed by Aura Mliss LABOR, RN (Registered Nurse) on 12/28/23 at 1023  Med List Status: <None>   Medication Order Taking? Sig Documenting Provider Last Dose Status Informant  Albuterol -Budesonide  90-80 MCG/ACT AERO [id number] Yes Take 2 puffs by mouth 4 (four) times daily as needed (shortness of breath.). [provider]  Active Spouse/Significant Other, Pharmacy Records, Other, Multiple Informants  amoxicillin -clavulanate (AUGMENTIN ) 875-125 MG tablet [id number] Yes Take 1 tablet by mouth every 12 (twelve) hours. Evonnie Lenis, MD  Active   doxycycline  (VIBRA -TABS) 100 MG tablet [id number] Yes Take 1 tablet (100 mg total) by mouth every 12 (twelve) hours. Evonnie Lenis, MD  Active   furosemide  (LASIX ) 20 MG tablet [id number] Yes Take 1 tablet (20 mg  total) by mouth See admin instructions. Take 1 tablet (20 mg) daily as needed if you gain more than 3 pounds in 1 day or 5 pounds in 1 week. [name], [name]  Active Spouse/Significant Other, Pharmacy Records, Other, Multiple Informants  levothyroxine  (SYNTHROID ) 112 MCG tablet [id number] Yes Take 112 mcg by mouth every morning. [provider]  Active Pharmacy Records, Spouse/Significant Other, Other, Multiple Informants  losartan  (COZAAR ) 25 MG tablet [id number] Yes Take 1 tablet (25 mg total) by mouth daily.  Patient taking differently: Take 25 mg by mouth daily. Pt is taking 12.5 mg   Kate Lonni CROME, MD  Active Pharmacy Records, Spouse/Significant Other, Other, Multiple Informants  metoprolol  succinate (TOPROL -XL) 50 MG 24 hr tablet [id number] Yes TAKE 1 TABLET BY MOUTH EVERY DAY  Patient taking differently: Take 25 mg by mouth daily.   [name], DO  Active Spouse/Significant Other, Pharmacy Records, Other, Multiple Informants  Multiple Vitamin (MULTIVITAMIN) capsule [id number] Yes Take 1 capsule by mouth daily. [provider]  Active Pharmacy Records, Spouse/Significant Other, Other, Multiple Informants  polyvinyl alcohol  (LIQUIFILM TEARS) 1.4 % ophthalmic solution [id number] Yes Place 1 drop into both eyes as needed for dry eyes. Delilah Marliss SQUIBB, MD  Active Pharmacy Records, Spouse/Significant Other, Other, Multiple Informants           Med Note CHRISTIE ALYSON Schaumann Feb 18, 2023  4:30 PM)    spironolactone  (ALDACTONE ) 25 MG tablet [id number] Yes Take 12.5 mg by mouth daily. [provider]  Active Pharmacy Records, Spouse/Significant Other, Other, Multiple Informants            Home Care and Equipment/Supplies: Were Home Health Services Ordered?: No Any new equipment or medical supplies ordered?: No  Functional Questionnaire:  Do you need assistance with bathing/showering or dressing?: No Do you need assistance with meal preparation?:  No Do you need assistance with eating?: No Do you have difficulty maintaining continence: No (no issues with bowel movements, has urinary ileal conduit) Do you need assistance with getting out of bed/getting out of a chair/moving?: No Do you have difficulty managing or taking your medications?: No  Follow up appointments reviewed: PCP Follow-up appointment confirmed?: Yes Date of PCP follow-up appointment?: 01/04/24 Follow-up Provider: Nena Hummer NP  @ 11 am Specialist Hospital Follow-up appointment confirmed?: Yes Date of Specialist follow-up appointment?: 01/26/24 Follow-Up Specialty Provider:: cardiologist @ 9 am Do you need transportation to your follow-up appointment?: No Do you understand care options if your condition(s) worsen?: Yes-patient verbalized understanding  SDOH Interventions Today    Flowsheet Row Most Recent Value  SDOH Interventions   Food Insecurity Interventions Intervention Not Indicated  Housing Interventions Intervention Not Indicated  Transportation Interventions Intervention Not Indicated  Utilities Interventions Intervention Not Indicated    Goals Addressed             This Visit's Progress    [facility] Transitions of Care (TOC) Care Plan       Problems:  Recent Hospitalization for treatment of Sepsis secondary to urinary source Patient states he is feeling better although still weak, pt has supportive spouse  Goal:  Over the next 30 days, the patient will not experience hospital readmission  Interventions: Sepsis secondary to urinary source Evaluation of current treatment plan related to Sepsis secondary to urinary source, self-management and patient's adherence to plan as established by provider. Discussed plans with patient for ongoing care management follow up and provided patient with direct contact information for care management team Evaluation of current treatment plan related to Sepsis secondary to urinary source and patient's adherence  to plan as established by provider Reviewed medications with patient and discussed importance of taking as prescribed Reviewed scheduled/upcoming provider appointments including primary care provider 01/04/24 @ 11 am, cardiologist 01/26/24 @ 9 am Screening for signs and symptoms of depression related to chronic disease state  Assessed social determinant of health barriers Reviewed signs /symptoms of infection, UTI Encouraged pt to avoid outdoor activities during extreme heat, try early morning or late afternoon, stay well hydrated  Patient Self Care Activities:  Attend all scheduled provider appointments Attend church or other social activities Call pharmacy for medication refills 3-7 days in advance of running out of medications Call provider office for new concerns or questions  Notify RN Care Manager of TOC call rescheduling needs Participate in Transition of Care Program/Attend TOC scheduled calls Perform all self care activities independently  Perform IADL's (shopping, preparing meals, housekeeping, managing finances) independently Take medications as prescribed   Drink adequate fluids, preferably water   Plan:  Telephone follow up appointment with care management team member scheduled for:  01/05/24 @ 930 am The patient has been provided with contact information for the care management team and has been advised to call with any health related questions or concerns.         Mliss Creed [facility], BSN RN Care Manager/ Transition of Care [place]/ [facility] [phone]

## 2023-12-28 NOTE — Patient Instructions (Signed)
 Visit Information  Thank you for taking time to visit with me today. Please don't hesitate to contact me if I can be of assistance to you before our next scheduled telephone appointment.   Goals Addressed             This Visit's Progress    [facility]I Transitions of Care (TOC) Care Plan       Problems:  Recent Hospitalization for treatment of Sepsis secondary to urinary source Patient states he is feeling better although still weak, pt has supportive spouse  Goal:  Over the next 30 days, the patient will not experience hospital readmission  Interventions: Sepsis secondary to urinary source Evaluation of current treatment plan related to Sepsis secondary to urinary source, self-management and patient's adherence to plan as established by provider. Discussed plans with patient for ongoing care management follow up and provided patient with direct contact information for care management team Evaluation of current treatment plan related to Sepsis secondary to urinary source and patient's adherence to plan as established by provider Reviewed medications with patient and discussed importance of taking as prescribed Reviewed scheduled/upcoming provider appointments including primary care provider 01/04/24 @ 11 am, cardiologist 01/26/24 @ 9 am Screening for signs and symptoms of depression related to chronic disease state  Assessed social determinant of health barriers Reviewed signs /symptoms of infection, UTI Encouraged pt to avoid outdoor activities during extreme heat, try early morning or late afternoon, stay well hydrated  Patient Self Care Activities:  Attend all scheduled provider appointments Attend church or other social activities Call pharmacy for medication refills 3-7 days in advance of running out of medications Call provider office for new concerns or questions  Notify RN Care Manager of TOC call rescheduling needs Participate in Transition of Care Program/Attend TOC scheduled  calls Perform all self care activities independently  Perform IADL's (shopping, preparing meals, housekeeping, managing finances) independently Take medications as prescribed   Drink adequate fluids, preferably water   Plan:  Telephone follow up appointment with care management team member scheduled for:  01/05/24 @ 930 am The patient has been provided with contact information for the care management team and has been advised to call with any health related questions or concerns.          Our next appointment is by telephone on 01/05/24 @ 930 am  Please call the care guide team at [phone] if you need to cancel or reschedule your appointment.   If you are experiencing a Mental Health or Behavioral Health Crisis or need someone to talk to, please call the Suicide and Crisis Lifeline: 988 call the [place]  National Suicide Prevention Lifeline: 323-437-4703 or TTY: 9020830555 TTY 902-438-4550) to talk to a trained counselor call 1-800-273-TALK (toll free, 24 hour hotline) go to [facility] [address] [place] [phone]) call the [facility] Crisis Line: [phone] call 911   Patient verbalizes understanding of instructions and care plan provided today and agrees to view in MyChart. Active MyChart status and patient understanding of how to access instructions and care plan via MyChart confirmed with patient.     Telephone follow up appointment with care management team member scheduled for: 01/05/24 @ 930 am  Mliss Creed [facility], BSN RN Care Manager/ Transition of Care [place]/ [facility] [phone]

## 2023-12-29 LAB — CULTURE, BLOOD (ROUTINE X 2)
Culture: NO GROWTH
Culture: NO GROWTH
Special Requests: ADEQUATE
Special Requests: ADEQUATE

## 2023-12-31 ENCOUNTER — Telehealth: Payer: Self-pay

## 2023-12-31 ENCOUNTER — Other Ambulatory Visit

## 2023-12-31 DIAGNOSIS — E211 Secondary hyperparathyroidism, not elsewhere classified: Secondary | ICD-10-CM | POA: Diagnosis not present

## 2023-12-31 DIAGNOSIS — D631 Anemia in chronic kidney disease: Secondary | ICD-10-CM | POA: Diagnosis not present

## 2023-12-31 DIAGNOSIS — R809 Proteinuria, unspecified: Secondary | ICD-10-CM | POA: Diagnosis not present

## 2023-12-31 DIAGNOSIS — N189 Chronic kidney disease, unspecified: Secondary | ICD-10-CM | POA: Diagnosis not present

## 2023-12-31 NOTE — Telephone Encounter (Signed)
 Copied from CRM [id number]. Topic: Clinical - Request for Lab/Test Order >> Dec 31, 2023  8:05 AM Zane F wrote: Reason for CRM:   Patient's wife called in stating that the patient kidney doctor Dr. Rachele is requesting a list of labs. The patient's wife has the orders in hand to have them completed in office. Please call the patient wife to schedule if familiar with this process.   Dr. Juanito office : [phone]  Callback Number: [phone]

## 2023-12-31 NOTE — Telephone Encounter (Signed)
 Labs drawn today. 12/31/2023

## 2023-12-31 NOTE — Telephone Encounter (Signed)
 [email or web address]

## 2024-01-03 DIAGNOSIS — I130 Hypertensive heart and chronic kidney disease with heart failure and stage 1 through stage 4 chronic kidney disease, or unspecified chronic kidney disease: Secondary | ICD-10-CM | POA: Diagnosis not present

## 2024-01-03 DIAGNOSIS — I5042 Chronic combined systolic (congestive) and diastolic (congestive) heart failure: Secondary | ICD-10-CM | POA: Diagnosis not present

## 2024-01-03 DIAGNOSIS — R809 Proteinuria, unspecified: Secondary | ICD-10-CM | POA: Diagnosis not present

## 2024-01-03 DIAGNOSIS — N1831 Chronic kidney disease, stage 3a: Secondary | ICD-10-CM | POA: Diagnosis not present

## 2024-01-03 NOTE — Progress Notes (Unsigned)
 Established Patient Office Visit  Subjective  Patient ID: Billy Rasmussen, male    DOB: 1945-06-22  Age: 78 y.o. MRN: [id number]  No chief complaint on file.   HPI The patient was hospitalized from May 22 to Oct 25, 2023, for treatment of sepsis secondary to a urinary tract infection (UTI) with Pseudomonas and Pantoea species. He completed a total course of antibiotics, including 7 additional days of ciprofloxacin  after discharge.  He reports significant improvement since hospitalization. His appetite has returned to normal. He denies fever, chills, chest pain, shortness of breath, or urinary symptoms.Past Medical History: Bladder cancer, Thyroid  cancer, Coronary artery disease (CAD), Hypertension, Hyperlipidemia (HLD), Chronic kidney disease (CKD) stage IIIa CT Thoracic and Lumbar Spine (12/26/2023): No acute fracture or infection. Chronic degenerative changes in both thoracic and lumbar spine. Most severe at L2-L3 with disc height loss and sclerosis. No significant spinal canal or nerve compression.  CT Thoracic & Lumbar Spine (12/26/2023): No acute fracture or infection. Chronic multilevel degenerative changes, most severe at L2-L3. No significant spinal stenosis.  CT Chest/Abdomen/Pelvis Summary (12/26/2023): Moderate cardiomegaly (increased since 12/10/2023) Mild aortic atherosclerosis; ascending aorta 3.9 cm (not aneurysmal) Mild left lower lobe atelectasis No significant lymphadenopathy Chronic thoracic spine and shoulder hardware changes noted Transition care care plan reviewed  Patient Active Problem List   Diagnosis Date Noted   Sepsis due to undetermined organism (HCC) 12/24/2023   CKD stage 3a, GFR 45-59 ml/min (HCC) 08/22/2023   Chronic combined systolic and diastolic CHF (congestive heart failure) (HCC) 08/22/2023   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   S/P total thyroidectomy 04/14/2023   HFrEF (heart failure with reduced ejection fraction) (HCC) 10/10/2022    Thrombocytopenia (HCC) 10/07/2022   Acquired hypothyroidism 10/07/2022   Multiple thyroid  nodules 09/14/2022   Aneurysm of ascending aorta without rupture (HCC) 09/02/2022   Lumbar foraminal stenosis 03/21/2020   S/P radical cystoprostatectomy 12/28/2019   S/p nephrectomy 12/28/2019   Renal mass 07/15/2019   Nonfunctioning kidney 07/14/2019   OSA (obstructive sleep apnea) 05/12/2019   SIRS (systemic inflammatory response syndrome) (HCC) 04/09/2019   Gastroesophageal reflux disease    BCG cystitis 02/02/2018   Pure hypercholesterolemia 10/02/2016   Malignant neoplasm of urinary bladder (HCC) 10/02/2016   Lipoma 09/30/2016   S/P right TKA 03/30/2016   S/P knee replacement 03/30/2016   Essential hypertension 02/25/2016   Obesity (BMI 30-39.9) 01/07/2016   S/P shoulder replacement 07/20/2013   Shoulder arthritis 04/07/2013   Past Medical History:  Diagnosis Date   AICD (automatic cardioverter/defibrillator) present    [employer]   Anemia aprox. may-1-24   Arthritis    knees, shoulders, elbows   Bladder cancer [facility])    urologist-  dr nieves   CHF (congestive heart failure) (HCC)    Chronic kidney disease    left kidney removed   Diverticulosis of colon    Fibromyalgia    GERD (gastroesophageal reflux disease)    History of diverticulitis of colon    History of gastric ulcer    due to aleve   Hypertension    Lower urinary tract symptoms (LUTS)    Myocardial infarction (HCC) may 1-24   OSA (obstructive sleep apnea)    Wears CPAP nightly   Oxygen  deficiency    Pericarditis 05/17/2023   PONV (postoperative nausea and vomiting)    Psoriasis    Sepsis (HCC)    january 2021   Sleep apnea    Thyroid  disease    biopsy on 10-08-22   Tinnitus  right ear more, has tranmitter in right ear removable at hs   Ulcer    Wears glasses    Wears partial dentures    Past Surgical History:  Procedure Laterality Date   APPENDECTOMY     BIV ICD INSERTION CRT-D N/A 03/01/2023    Procedure: BIV ICD INSERTION CRT-D;  Surgeon: Nancey Eulas BRAVO, MD;  Location: [facility] INVASIVE CV LAB;  Service: Cardiovascular;  Laterality: N/A;   COLECTOMY W/ COLOSTOMY  1996   W/   APPENDECTOMY   COLONOSCOPY N/A 05/11/2018   Procedure: COLONOSCOPY;  Surgeon: Shaaron Lamar HERO, MD;  [facility];  Service: Endoscopy;  Laterality: N/A;  9:30   COLOSTOMY TAKEDOWN  1996   CYSTOSCOPY WITH BIOPSY N/A 12/05/2013   Procedure: CYSTO BLADDER BIOPSY AND FULGERATION;  Surgeon: Donnice Brooks, MD;  Location: [facility];  Service: Urology;  Laterality: N/A;   CYSTOSCOPY WITH BIOPSY Bilateral 11/13/2014   Procedure: CYSTOSCOPY WITH  BLADDER BIOPSY FULGERATION AND BILATERAL RETROGRADE PYELOGRAMS;  Surgeon: Donnice Brooks, MD;  Location: [facility];  Service: Urology;  Laterality: Bilateral;   CYSTOSCOPY WITH FULGERATION N/A 01/18/2018   Procedure: CYSTOSCOPY WITH FULGERATION/ BLADDER BIOPSY;  Surgeon: Brooks Donnice, MD;  Location: [facility];  Service: Urology;  Laterality: N/A;   CYSTOSCOPY WITH INJECTION N/A 11/09/2018   Procedure: CYSTOSCOPY WITH INJECTION OF INDOCYANINE GREEN  DYE;  Surgeon: Alvaro Hummer, MD;  Location: [facility];  Service: Urology;  Laterality: N/A;   CYSTOSCOPY WITH INSERTION OF UROLIFT N/A 01/18/2018   Procedure: CYSTOSCOPY WITH INSERTION OF UROLIFT;  Surgeon: Brooks Donnice, MD;  Location: [facility];  Service: Urology;  Laterality: N/A;   CYSTOSCOPY/URETEROSCOPY/HOLMIUM LASER Left 02/17/2019   Procedure: URETEROSCOPY WITH BALLOON DILATION  AND NEPHROSTOGRAM;  Surgeon: Alvaro Hummer, MD;  Location: [facility];  Service: Urology;  Laterality: Left;  1 HR   EXCISION RIGHT UPPER ARM LIPOMA  2005   FRACTURE SURGERY     HEMORROIDECTOMY     INGUINAL HERNIA REPAIR Left 1984   IR NEPHROSTOMY PLACEMENT LEFT  01/05/2019   JOINT REPLACEMENT     both  knees, and both shoulders joints  replaced.   KNEE ARTHROSCOPY Left X3  LAST ONE  2002   ORIF LEFT HUMEROUS FX  1976   POLYPECTOMY  05/11/2018   Procedure: POLYPECTOMY;  Surgeon: Shaaron Lamar HERO, MD;  [facility];  Service: Endoscopy;;   PROSTATE SURGERY     RIGHT HEART CATH AND CORONARY ANGIOGRAPHY N/A 10/09/2022   Procedure: RIGHT HEART CATH AND CORONARY ANGIOGRAPHY;  Surgeon: Wendel Lurena POUR, MD;  Location: [facility] INVASIVE CV LAB;  Service: Cardiovascular;  Laterality: N/A;   ROBOT ASSISTED LAPAROSCOPIC NEPHRECTOMY Left 07/14/2019   Procedure: XI ROBOTIC ASSISTED LAPAROSCOPIC RETROPERITONEAL NEPHRECTOMY;  Surgeon: Alvaro Hummer, MD;  Location: [facility];  Service: Urology;  Laterality: Left;  3 HRS   SMALL INTESTINE SURGERY     THYROIDECTOMY Bilateral 04/14/2023   Procedure: TOTAL THYROIDECTOMY;  Surgeon: Jesus Oliphant, MD;  Location: [facility] OR;  Service: ENT;  Laterality: Bilateral;   TONSILLECTOMY  AS CHILD   TOTAL KNEE ARTHROPLASTY Left 2006   REVISION 2007  (AFTER I & D WITH ANTIBIOTIC SPACER PROCEDURE FOR STEPH INFECTION)   TOTAL KNEE ARTHROPLASTY Right 03/30/2016   Procedure: RIGHT TOTAL KNEE ARTHROPLASTY;  Surgeon: Donnice Car, MD;  Location: [facility];  Service: Orthopedics;  Laterality: Right;   TOTAL SHOULDER ARTHROPLASTY Right 04/06/2013   Procedure: RIGHT TOTAL SHOULDER ARTHROPLASTY;  Surgeon: Franky CHRISTELLA Pointer, MD;  [facility];  Service: Orthopedics;  Laterality: Right;   TOTAL SHOULDER ARTHROPLASTY Left 07/20/2013   Procedure: LEFT TOTAL SHOULDER ARTHROPLASTY;  Surgeon: Franky CHRISTELLA Pointer, MD;  Location: [facility] OR;  Service: Orthopedics;  Laterality: Left;   TRANSURETHRAL RESECTION OF BLADDER TUMOR N/A 11/12/2015   Procedure: TRANSURETHRAL RESECTION OF BLADDER TUMOR (TURBT);  Surgeon: Donnice Brooks, MD;  Location: [facility];  Service: Urology;  Laterality: N/A;   TRANSURETHRAL RESECTION OF BLADDER TUMOR WITH GYRUS (TURBT-GYRUS) N/A 12/05/2013   Procedure: TRANSURETHRAL RESECTION OF BLADDER  TUMOR WITH GYRUS (TURBT-GYRUS);  Surgeon: Donnice Brooks, MD;  Location: [facility];  Service: Urology;  Laterality: N/A;   Social History   Tobacco Use   Smoking status: Former    Current packs/day: 0.00    Average packs/day: 2.0 packs/day for 40.0 years (80.0 ttl pk-yrs)    Types: Cigarettes    Start date: 06/01/1957    Quit date: 06/01/1997    Years since quitting: 26.6    Passive exposure: Past   Smokeless tobacco: Never  Vaping Use   Vaping status: Never Used  Substance Use Topics   Alcohol  use: No   Drug use: No   Social History   Socioeconomic History   Marital status: Married    Spouse name: Peggy   Number of children: 2   Years of education: 14   Highest education level: Tax adviser degree: occupational, Scientist, product/process development, or vocational program  Occupational History   Occupation: retired    Comment: Pharmacologist, 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   Tobacco Use   Smoking status: Former    Current packs/day: 0.00    Average packs/day: 2.0 packs/day for 40.0 years (80.0 ttl pk-yrs)    Types: Cigarettes    Start date: 06/01/1957    Quit date: 06/01/1997    Years since quitting: 26.6    Passive exposure: Past   Smokeless tobacco: Never  Vaping Use   Vaping status: Never Used  Substance and Sexual Activity   Alcohol  use: No   Drug use: No   Sexual activity: Not Currently    Comment: vesectomy  Other Topics Concern   Not on file  Social History Narrative   One level home with wife    83/6070 - 19 year old MIL lives with them   Caffiene 2 cups daily, tea occas.   Retired, Visual merchandiser   Social Drivers of Corporate investment banker Strain: Low Risk  (08/16/2023)   Overall Financial Resource Strain (CARDIA)    Difficulty of Paying Living Expenses: Not very hard  Recent Concern: Financial Resource Strain - Medium Risk (07/05/2023)   Overall Financial Resource Strain (CARDIA)    Difficulty of Paying Living Expenses: Somewhat hard  Food Insecurity: No Food Insecurity (12/28/2023)    Hunger Vital Sign    Worried About Running Out of Food in the Last Year: Never true    Ran Out of Food in the Last Year: Never true  Transportation Needs: No Transportation Needs (12/28/2023)   PRAPARE - Administrator, Civil Service (Medical): No    Lack of Transportation (Non-Medical): No  Physical Activity: Insufficiently Active (08/16/2023)   Exercise Vital Sign    Days of Exercise per Week: 3 days    Minutes of Exercise per Session: 10 min  Stress: No Stress Concern Present (12/10/2023)   [employer] of Occupational Health - Occupational Stress Questionnaire    Feeling of Stress: Not at all  Social Connections:  Socially Integrated (12/24/2023)   Social Connection and Isolation Panel    Frequency of Communication with Friends and Family: More than three times a week    Frequency of Social Gatherings with Friends and Family: More than three times a week    Attends Religious Services: More than 4 times per year    Active Member of [employer] or Organizations: Yes    Attends Banker Meetings: More than 4 times per year    Marital Status: Married  Catering manager Violence: Not At Risk (12/28/2023)   Humiliation, Afraid, Rape, and Kick questionnaire    Fear of Current or Ex-Partner: No    Emotionally Abused: No    Physically Abused: No    Sexually Abused: No   Family Status  Relation Name Status   Mother  Deceased   Father BEN Grabski Deceased   Sister  Deceased at age as a baby    Brother Financial planner   MGM  Deceased   MGF  Deceased   PGM  Deceased   PGF  Deceased   Daughter SHONDA Alive   Daughter EMILY Alive   Neg Hx  (Not Specified)  No partnership data on file   Family History  Problem Relation Age of Onset   Alzheimer's disease Mother    Heart attack Father    Heart disease Father    Arthritis Father    Hearing loss Father    Hypertension Father    Irregular heart beat Brother        DEFIB.   Congestive Heart Failure Brother    Heart  disease Brother    Diabetes Daughter    Sleep apnea Neg Hx    Allergies  Allergen Reactions   Ciprofloxacin  Itching      Review of Systems  Constitutional:  Negative for chills and fever.  HENT:  Negative for congestion and sore throat.   Respiratory:  Negative for sputum production.   Cardiovascular:  Negative for chest pain and leg swelling.  Gastrointestinal:  Negative for abdominal pain, blood in stool, diarrhea, nausea and vomiting.  Genitourinary:  Negative for dysuria, flank pain, frequency, hematuria and urgency.  Skin:  Negative for itching and rash.  Neurological:  Negative for dizziness and headaches.   Negative unless indicated in HPI   Objective:     There were no vitals taken for this visit. BP Readings from Last 3 Encounters:  12/27/23 121/81  12/13/23 109/64  10/29/23 112/71   Wt Readings from Last 3 Encounters:  12/28/23 210 lb (95.3 kg)  12/24/23 218 lb (98.9 kg)  12/13/23 218 lb (98.9 kg)      Physical Exam Vitals and nursing note reviewed.  Constitutional:      General: He is not in acute distress. HENT:     Head: Normocephalic and atraumatic.     Right Ear: Tympanic membrane, ear canal and external ear normal. There is no impacted cerumen.     Left Ear: Tympanic membrane, ear canal and external ear normal. There is no impacted cerumen.     Nose: Nose normal.     Mouth/Throat:     Mouth: Mucous membranes are moist.  Eyes:     General: No scleral icterus.    Extraocular Movements: Extraocular movements intact.     Pupils: Pupils are equal, round, and reactive to light.  Cardiovascular:     Heart sounds: Normal heart sounds.  Pulmonary:     Effort: Pulmonary effort is normal.  Breath sounds: Normal breath sounds.  Abdominal:     Palpations: Abdomen is soft.  Musculoskeletal:        General: Normal range of motion.     Right lower leg: No edema.     Left lower leg: No edema.  Skin:    General: Skin is warm and dry.     Findings: No  rash.  Neurological:     Mental Status: He is alert and oriented to person, place, and time.  Psychiatric:        Mood and Affect: Mood normal.        Behavior: Behavior normal.        Thought Content: Thought content normal.        Judgment: Judgment normal.      No results found for any visits on 01/04/24.  Last CBC Lab Results  Component Value Date   WBC 7.6 12/26/2023   HGB 12.0 (L) 12/26/2023   HCT 35.1 (L) 12/26/2023   MCV 95.4 12/26/2023   MCH 32.6 12/26/2023   RDW 14.6 12/26/2023   PLT 104 (L) 12/26/2023   Last metabolic panel Lab Results  Component Value Date   GLUCOSE 101 (H) 12/26/2023   NA 134 (L) 12/26/2023   K 3.7 12/26/2023   CL 103 12/26/2023   CO2 22 12/26/2023   BUN 32 (H) 12/26/2023   CREATININE 1.57 (H) 12/26/2023   GFRNONAA 45 (L) 12/26/2023   CALCIUM  8.9 12/26/2023   PHOS 2.6 08/22/2023   PROT 7.2 12/26/2023   ALBUMIN  3.6 12/26/2023   LABGLOB 2.8 11/15/2023   AGRATIO 1.7 11/05/2022   BILITOT 1.2 12/26/2023   ALKPHOS 43 12/26/2023   AST 27 12/26/2023   ALT 14 12/26/2023   ANIONGAP 9 12/26/2023   Last lipids Lab Results  Component Value Date   CHOL 137 12/17/2023   HDL 26 (L) 12/17/2023   LDLCALC 84 12/17/2023   LDLDIRECT 96 02/25/2016   TRIG 151 (H) 12/17/2023   CHOLHDL 5.3 (H) 12/17/2023   Last hemoglobin A1c Lab Results  Component Value Date   HGBA1C 5.1 07/07/2022   Last thyroid  functions Lab Results  Component Value Date   TSH 4.190 09/13/2023        Assessment & Plan:  There are no diagnoses linked to this encounter. Barlow is a 78 year old Caucasian male seen today for posthospital discharge follow-up, no acute distress  Status-post hospitalization for sepsis (5/22-5/26/25) - resolved, completed antibiotics, clinically improved. Urinary tract infection (Pseudomonas and Pantoea) - treated with ciprofloxacin , no current symptoms. Bladder cancer - history of, currently no acute issues. Coronary artery disease -  stable, no chest pain. Hypertension - stable on current regimen. Hyperlipidemia - continue management. Chronic kidney disease stage IIIa - monitor renal function.  Sepsis/UTI: No signs of ongoing infection. Monitor for recurrence. Encourage hydration. Labs: Order BMP, CBC result pending Medications: Continue taking all previously prescribed medication Chronic conditions: -Continue antihypertensive and lipid-lowering therapy. -Monitor renal function follow up with nephrology  Health maintenance: Annual influenza vaccine in fall  Patient education: Advised to monitor for fever, chills, dysuria, or fatigue.  The above assessment and management plan was discussed with the patient. The patient verbalized understanding of and has agreed to the management plan. Patient is aware to call the clinic if they develop any new symptoms or if symptoms persist or worsen. Patient is aware when to return to the clinic for a follow-up visit. Patient educated on when it is appropriate to go to the emergency department.  No follow-ups on file.    Lawan Nanez [place], DNP [facility] [address] [place], [place] [address] [phone]    Note: This document was prepared by [employer]ch[employer] voice dictation technology and any errors that results from this process are unintentional.

## 2024-01-04 ENCOUNTER — Ambulatory Visit: Admitting: Nurse Practitioner

## 2024-01-04 ENCOUNTER — Encounter: Payer: Self-pay | Admitting: Nurse Practitioner

## 2024-01-04 VITALS — BP 109/69 | HR 60 | Temp 97.5°F | Ht 66.0 in | Wt 216.8 lb

## 2024-01-04 DIAGNOSIS — R6520 Severe sepsis without septic shock: Secondary | ICD-10-CM

## 2024-01-04 DIAGNOSIS — A419 Sepsis, unspecified organism: Secondary | ICD-10-CM

## 2024-01-04 DIAGNOSIS — E871 Hypo-osmolality and hyponatremia: Secondary | ICD-10-CM | POA: Diagnosis not present

## 2024-01-04 DIAGNOSIS — Z09 Encounter for follow-up examination after completed treatment for conditions other than malignant neoplasm: Secondary | ICD-10-CM | POA: Diagnosis not present

## 2024-01-05 ENCOUNTER — Encounter: Payer: Self-pay | Admitting: Oncology

## 2024-01-05 ENCOUNTER — Ambulatory Visit: Payer: Self-pay | Admitting: Nurse Practitioner

## 2024-01-05 ENCOUNTER — Other Ambulatory Visit: Payer: Self-pay | Admitting: *Deleted

## 2024-01-05 LAB — CBC WITH DIFFERENTIAL/PLATELET
Basophils Absolute: 0 x10E3/uL (ref 0.0–0.2)
Basos: 0 %
EOS (ABSOLUTE): 0.1 x10E3/uL (ref 0.0–0.4)
Eos: 2 %
Hematocrit: 38.8 % (ref 37.5–51.0)
Hemoglobin: 12.8 g/dL — ABNORMAL LOW (ref 13.0–17.7)
Immature Grans (Abs): 0.1 x10E3/uL (ref 0.0–0.1)
Immature Granulocytes: 1 %
Lymphocytes Absolute: 1.4 x10E3/uL (ref 0.7–3.1)
Lymphs: 25 %
MCH: 32.5 pg (ref 26.6–33.0)
MCHC: 33 g/dL (ref 31.5–35.7)
MCV: 99 fL — ABNORMAL HIGH (ref 79–97)
Monocytes Absolute: 0.5 x10E3/uL (ref 0.1–0.9)
Monocytes: 9 %
Neutrophils Absolute: 3.4 x10E3/uL (ref 1.4–7.0)
Neutrophils: 63 %
Platelets: 224 x10E3/uL (ref 150–450)
RBC: 3.94 x10E6/uL — ABNORMAL LOW (ref 4.14–5.80)
RDW: 14.3 % (ref 11.6–15.4)
WBC: 5.4 x10E3/uL (ref 3.4–10.8)

## 2024-01-05 LAB — CMP14+EGFR
ALT: 28 IU/L (ref 0–44)
AST: 47 IU/L — AB (ref 0–40)
Albumin: 4.3 g/dL (ref 3.8–4.8)
Alkaline Phosphatase: 86 IU/L (ref 44–121)
BUN/Creatinine Ratio: 15 (ref 10–24)
BUN: 22 mg/dL (ref 8–27)
Bilirubin Total: 0.6 mg/dL (ref 0.0–1.2)
CO2: 23 mmol/L (ref 20–29)
Calcium: 9.4 mg/dL (ref 8.6–10.2)
Chloride: 102 mmol/L (ref 96–106)
Creatinine, Ser: 1.43 mg/dL — AB (ref 0.76–1.27)
Globulin, Total: 2.9 g/dL (ref 1.5–4.5)
Glucose: 78 mg/dL (ref 70–99)
Potassium: 5.1 mmol/L (ref 3.5–5.2)
Sodium: 137 mmol/L (ref 134–144)
Total Protein: 7.2 g/dL (ref 6.0–8.5)
eGFR: 51 mL/min/1.73 — AB (ref >59)

## 2024-01-05 NOTE — Patient Instructions (Signed)
 Visit Information  Thank you for taking time to visit with me today. Please don't hesitate to contact me if I can be of assistance to you before our next scheduled telephone appointment.  Our next appointment is by telephone on 01/12/24 @ 1015 am  Following is a copy of your care plan:   Goals Addressed             This Visit's Progress    VBCI Transitions of Care (TOC) Care Plan       Problems:  Recent Hospitalization for treatment of Sepsis secondary to urinary source Patient states he is feeling much better, getting stronger, pt has supportive spouse Patient saw primary care provider on 01/04/24, had labs completed, pt reports he is pleased that some of his numbers are trending down  Goal:  Over the next 30 days, the patient will not experience hospital readmission  Interventions: Sepsis secondary to urinary source Evaluation of current treatment plan related to Sepsis secondary to urinary source, self-management and patient's adherence to plan as established by provider. Discussed plans with patient for ongoing care management follow up and provided patient with direct contact information for care management team Evaluation of current treatment plan related to Sepsis secondary to urinary source and patient's adherence to plan as established by provider Reviewed medications with patient and discussed importance of taking as prescribed Reviewed scheduled/upcoming provider appointments including cardiologist 01/26/24 @ 9 am Reinforced signs /symptoms of infection, UTI Encouraged pt to avoid outdoor activities during extreme heat, try early morning or late afternoon, stay well hydrated  Patient Self Care Activities:  Attend all scheduled provider appointments Attend church or other social activities Call pharmacy for medication refills 3-7 days in advance of running out of medications Call provider office for new concerns or questions  Notify RN Care Manager of TOC call rescheduling  needs Participate in Transition of Care Program/Attend TOC scheduled calls Perform all self care activities independently  Perform IADL's (shopping, preparing meals, housekeeping, managing finances) independently Take medications as prescribed   Drink adequate fluids, preferably water  Alternate activity with rest  Plan:  Telephone outreach scheduled for - 01/12/24 @ 1015 am        Patient verbalizes understanding of instructions and care plan provided today and agrees to view in MyChart. Active MyChart status and patient understanding of how to access instructions and care plan via MyChart confirmed with patient.     Telephone follow up appointment with care management team member scheduled for:  01/12/24 @ 1015 am  Please call the care guide team at [phone] if you need to cancel or reschedule your appointment.   Please call the Suicide and Crisis Lifeline: 988 call the [place]  National Suicide Prevention Lifeline: 540-365-2304 or TTY: 317-656-1206 TTY 223-651-4433) to talk to a trained counselor call 1-800-273-TALK (toll free, 24 hour hotline) go to [facility] [address], [place] [phone]) call the [facility] Crisis Line: 731-445-5292 call 911 if you are experiencing a Mental Health or Behavioral Health Crisis or need someone to talk to.  Mliss Creed [facility], BSN RN Care Manager/ Transition of Care [place]/ [facility] [phone]

## 2024-01-05 NOTE — Patient Outreach (Signed)
 Transition of Care week 2  Visit Note  01/05/2024  Name: Billy Rasmussen MRN: [id number]          DOB: [birth year]  Situation: Patient enrolled in [facility] 30-day program. Visit completed with patient by telephone.   Background:     Past Medical History:  Diagnosis Date   AICD (automatic cardioverter/defibrillator) present    [employer]/St Jude   Anemia aprox. may-1-24   Arthritis    knees, shoulders, elbows   Bladder cancer [facility])    urologist-  dr nieves   CHF (congestive heart failure) (HCC)    Chronic kidney disease    left kidney removed   Diverticulosis of colon    Fibromyalgia    GERD (gastroesophageal reflux disease)    History of diverticulitis of colon    History of gastric ulcer    due to aleve   Hypertension    Lower urinary tract symptoms (LUTS)    Myocardial infarction (HCC) may 1-24   OSA (obstructive sleep apnea)    Wears CPAP nightly   Oxygen  deficiency    Pericarditis 05/17/2023   PONV (postoperative nausea and vomiting)    Psoriasis    Sepsis (HCC)    january 2021   Sleep apnea    Thyroid  disease    biopsy on 10-08-22   Tinnitus    right ear more, has tranmitter in right ear removable at hs   Ulcer    Wears glasses    Wears partial dentures     Assessment: Patient Reported Symptoms: Cognitive Cognitive Status: No symptoms reported, Able to follow simple commands, Alert and oriented to person, place, and time, Normal speech and language skills      Neurological Neurological Review of Symptoms: No symptoms reported    HEENT HEENT Symptoms Reported: No symptoms reported      Cardiovascular Cardiovascular Symptoms Reported: No symptoms reported Does patient have uncontrolled Hypertension?: No Cardiovascular Management Strategies: Medication therapy, Routine screening (pacemaker)  Respiratory Respiratory Symptoms Reported: No symptoms reported    Endocrine Endocrine Symptoms Reported: No symptoms reported Is patient diabetic?: No     Gastrointestinal Gastrointestinal Symptoms Reported: No symptoms reported      Genitourinary Genitourinary Symptoms Reported: No symptoms reported Additional Genitourinary Details: pt has urinary ileal conduit (2020), states urine looks good, clear   denies signs /symptoms of infection Genitourinary Management Strategies:  (drinking adequate fluids, including water ) Genitourinary Self-Management Outcome: 4 (good) Genitourinary Comment: finished antibiotics  Integumentary Integumentary Symptoms Reported: No symptoms reported    Musculoskeletal Musculoskelatal Symptoms Reviewed: No symptoms reported        Psychosocial Psychosocial Symptoms Reported: No symptoms reported         There were no vitals filed for this visit.  Medications Reviewed Today     Reviewed by Billy Mliss LABOR, RN (Registered Nurse) on 01/05/24 at 1005  Med List Status: <None>   Medication Order Taking? Sig Documenting Provider Last Dose Status Informant  Albuterol -Budesonide  90-80 MCG/ACT AERO [id number]  Take 2 puffs by mouth 4 (four) times daily as needed (shortness of breath.). [provider]  Active Spouse/Significant Other, Pharmacy Records, Other, Multiple Informants  amoxicillin -clavulanate (AUGMENTIN ) 875-125 MG tablet [id number]  Take 1 tablet by mouth every 12 (twelve) hours. Billy Lenis, MD  Active   doxycycline  (VIBRA -TABS) 100 MG tablet [id number]  Take 1 tablet (100 mg total) by mouth every 12 (twelve) hours. Billy Lenis, MD  Active   furosemide  (LASIX ) 20 MG tablet [id number]  Take 1 tablet (20 mg  total) by mouth See admin instructions. Take 1 tablet (20 mg) daily as needed if you gain more than 3 pounds in 1 day or 5 pounds in 1 week. Billy Bari LABOR, [name]  Active Spouse/Significant Other, Pharmacy Records, Other, Multiple Informants  levothyroxine  (SYNTHROID ) 112 MCG tablet [id number]  Take 112 mcg by mouth every morning. [provider]  Active Pharmacy Records, Spouse/Significant  Other, Other, Multiple Informants  losartan  (COZAAR ) 25 MG tablet [id number]  Take 1 tablet (25 mg total) by mouth daily.  Patient taking differently: Take 25 mg by mouth daily. Pt is taking 12.5 mg   Billy Lonni CROME, MD  Expired 01/04/24 2359 Pharmacy Records, Spouse/Significant Other, Other, Multiple Informants  metoprolol  succinate (TOPROL -XL) 50 MG 24 hr tablet [id number]  TAKE 1 TABLET BY MOUTH EVERY DAY  Patient taking differently: Take 25 mg by mouth daily.   Billy Norene HERO, DO  Active Spouse/Significant Other, Pharmacy Records, Other, Multiple Informants  Multiple Vitamin (MULTIVITAMIN) capsule [id number]  Take 1 capsule by mouth daily. [provider]  Active Pharmacy Records, Spouse/Significant Other, Other, Multiple Informants  polyvinyl alcohol  (LIQUIFILM TEARS) 1.4 % ophthalmic solution [id number]  Place 1 drop into both eyes as needed for dry eyes. Billy Marliss SQUIBB, MD  Active Pharmacy Records, Spouse/Significant Other, Other, Multiple Informants           Med Note Billy Rasmussen Feb 18, 2023  4:30 PM)    spironolactone  (ALDACTONE ) 25 MG tablet [id number]  Take 12.5 mg by mouth daily. [provider]  Active Pharmacy Records, Spouse/Significant Other, Other, Multiple Informants            Goals Addressed             This Visit's Progress    VBCI Transitions of Care (TOC) Care Plan       Problems:  Recent Hospitalization for treatment of Sepsis secondary to urinary source Patient states he is feeling much better, getting stronger, pt has supportive spouse Patient saw primary care provider on 01/04/24, had labs completed, pt reports he is pleased that some of his numbers are trending down  Goal:  Over the next 30 days, the patient will not experience hospital readmission  Interventions: Sepsis secondary to urinary source Evaluation of current treatment plan related to Sepsis secondary to urinary source, self-management and patient's  adherence to plan as established by provider. Discussed plans with patient for ongoing care management follow up and provided patient with direct contact information for care management team Evaluation of current treatment plan related to Sepsis secondary to urinary source and patient's adherence to plan as established by provider Reviewed medications with patient and discussed importance of taking as prescribed Reviewed scheduled/upcoming provider appointments including cardiologist 01/26/24 @ 9 am Reinforced signs /symptoms of infection, UTI Encouraged pt to avoid outdoor activities during extreme heat, try early morning or late afternoon, stay well hydrated  Patient Self Care Activities:  Attend all scheduled provider appointments Attend church or other social activities Call pharmacy for medication refills 3-7 days in advance of running out of medications Call provider office for new concerns or questions  Notify RN Care Manager of TOC call rescheduling needs Participate in Transition of Care Program/Attend TOC scheduled calls Perform all self care activities independently  Perform IADL's (shopping, preparing meals, housekeeping, managing finances) independently Take medications as prescribed   Drink adequate fluids, preferably water  Alternate activity with rest  Plan:  Telephone outreach scheduled for - 01/12/24 @ 1015 am  Recommendation:   PCP Follow-up Specialty provider follow-up cardiology  Follow Up Plan:   Telephone follow-up 01/12/24 @ 1015 am  Mliss Creed [facility], BSN RN Care Manager/ Transition of Care [place]/ [facility] [phone]

## 2024-01-10 DIAGNOSIS — Z936 Other artificial openings of urinary tract status: Secondary | ICD-10-CM | POA: Diagnosis not present

## 2024-01-10 DIAGNOSIS — C678 Malignant neoplasm of overlapping sites of bladder: Secondary | ICD-10-CM | POA: Diagnosis not present

## 2024-01-12 ENCOUNTER — Other Ambulatory Visit: Payer: Self-pay | Admitting: *Deleted

## 2024-01-12 NOTE — Patient Outreach (Addendum)
 Transition of Care week 3  Visit Note  01/12/2024  Name: Billy Rasmussen MRN: [id number]          DOB: 1946-05-10  Situation: Patient enrolled in [facility] 30-day program. Visit completed with patient by telephone.   Background:    Past Medical History:  Diagnosis Date   AICD (automatic cardioverter/defibrillator) present    [employer]/St Jude   Anemia aprox. may-1-24   Arthritis    knees, shoulders, elbows   Bladder cancer [facility])    urologist-  dr nieves   CHF (congestive heart failure) (HCC)    Chronic kidney disease    left kidney removed   Diverticulosis of colon    Fibromyalgia    GERD (gastroesophageal reflux disease)    History of diverticulitis of colon    History of gastric ulcer    due to aleve   Hypertension    Lower urinary tract symptoms (LUTS)    Myocardial infarction (HCC) may 1-24   OSA (obstructive sleep apnea)    Wears CPAP nightly   Oxygen  deficiency    Pericarditis 05/17/2023   PONV (postoperative nausea and vomiting)    Psoriasis    Sepsis (HCC)    january 2021   Sleep apnea    Thyroid  disease    biopsy on 10-08-22   Tinnitus    right ear more, has tranmitter in right ear removable at hs   Ulcer    Wears glasses    Wears partial dentures     Assessment: Patient Reported Symptoms: Cognitive Cognitive Status: No symptoms reported, Able to follow simple commands, Alert and oriented to person, place, and time, Normal speech and language skills      Neurological Neurological Review of Symptoms: No symptoms reported    HEENT HEENT Symptoms Reported: No symptoms reported      Cardiovascular Cardiovascular Symptoms Reported: No symptoms reported Does patient have uncontrolled Hypertension?: No Cardiovascular Management Strategies: Medication therapy, Routine screening Weight: 210 lb (95.3 kg) Cardiovascular Self-Management Outcome: 4 (good) Cardiovascular Comment: HF- reinforced HF action plan, pt continues weighing daily, weight today 210  pounds  Respiratory Respiratory Symptoms Reported: No symptoms reported    Endocrine Endocrine Symptoms Reported: No symptoms reported Is patient diabetic?: No Endocrine Comment: history thyroid  cancer and thyroidectomy 2024- follows up with endocrinology  Gastrointestinal Gastrointestinal Symptoms Reported: No symptoms reported      Genitourinary Genitourinary Symptoms Reported: No symptoms reported Additional Genitourinary Details: urinary ileal conduit (2020), reports urine is light yellow, clear, no concerns reported Genitourinary Management Strategies:  (drinking adequate fluids, including water ) Genitourinary Self-Management Outcome: 4 (good)  Integumentary Integumentary Symptoms Reported: No symptoms reported    Musculoskeletal Musculoskelatal Symptoms Reviewed: No symptoms reported        Psychosocial Psychosocial Symptoms Reported: No symptoms reported         There were no vitals filed for this visit.  Medications Reviewed Today     Reviewed by Aura Mliss LABOR, RN (Registered Nurse) on 01/12/24 at 1031  Med List Status: <None>   Medication Order Taking? Sig Documenting Provider Last Dose Status Informant  Albuterol -Budesonide  90-80 MCG/ACT AERO [id number]  Take 2 puffs by mouth 4 (four) times daily as needed (shortness of breath.). [provider]  Active Spouse/Significant Other, Pharmacy Records, Other, Multiple Informants  amoxicillin -clavulanate (AUGMENTIN ) 875-125 MG tablet [id number]  Take 1 tablet by mouth every 12 (twelve) hours. Evonnie Lenis, MD  Active   doxycycline  (VIBRA -TABS) 100 MG tablet [id number]  Take 1 tablet (100 mg total) by  mouth every 12 (twelve) hours. Evonnie Lenis, MD  Active   furosemide  (LASIX ) 20 MG tablet [id number]  Take 1 tablet (20 mg total) by mouth See admin instructions. Take 1 tablet (20 mg) daily as needed if you gain more than 3 pounds in 1 day or 5 pounds in 1 week. [name], [name]  Active Spouse/Significant Other, Pharmacy  Records, Other, Multiple Informants  levothyroxine  (SYNTHROID ) 112 MCG tablet [id number]  Take 112 mcg by mouth every morning. [provider]  Active Pharmacy Records, Spouse/Significant Other, Other, Multiple Informants  losartan  (COZAAR ) 25 MG tablet [id number]  Take 1 tablet (25 mg total) by mouth daily.  Patient taking differently: Take 25 mg by mouth daily. Pt is taking 12.5 mg   Kate Lonni CROME, MD  Expired 01/04/24 2359 Pharmacy Records, Spouse/Significant Other, Other, Multiple Informants  metoprolol  succinate (TOPROL -XL) 50 MG 24 hr tablet [id number]  TAKE 1 TABLET BY MOUTH EVERY DAY  Patient taking differently: Take 25 mg by mouth daily.   [name], DO  Active Spouse/Significant Other, Pharmacy Records, Other, Multiple Informants  Multiple Vitamin (MULTIVITAMIN) capsule [id number]  Take 1 capsule by mouth daily. [provider]  Active Pharmacy Records, Spouse/Significant Other, Other, Multiple Informants  polyvinyl alcohol  (LIQUIFILM TEARS) 1.4 % ophthalmic solution [id number]  Place 1 drop into both eyes as needed for dry eyes. Delilah Marliss SQUIBB, MD  Active Pharmacy Records, Spouse/Significant Other, Other, Multiple Informants           Med Note CHRISTIE ALYSON Schaumann Feb 18, 2023  4:30 PM)    spironolactone  (ALDACTONE ) 25 MG tablet [id number]  Take 12.5 mg by mouth daily. [provider]  Active Pharmacy Records, Spouse/Significant Other, Other, Multiple Informants            Goals Addressed             This Visit's Progress    VBCI Transitions of Care (TOC) Care Plan       Problems:  Recent Hospitalization for treatment of Sepsis secondary to urinary source Patient states he is feeling much better, getting stronger, pt has supportive spouse Patient saw primary care provider on 01/04/24, had labs completed, pt reports he is pleased that some of his numbers are trending down 01/12/24- pt reports  doing good, no issues  no new concerns  reported  Goal:  Over the next 30 days, the patient will not experience hospital readmission  Interventions: Sepsis secondary to urinary source Evaluation of current treatment plan related to Sepsis secondary to urinary source, self-management and patient's adherence to plan as established by provider. Discussed plans with patient for ongoing care management follow up and provided patient with direct contact information for care management team Evaluation of current treatment plan related to Sepsis secondary to urinary source and patient's adherence to plan as established by provider Reviewed medications with patient and discussed importance of taking as prescribed Reviewed scheduled/upcoming provider appointments including cardiologist 01/26/24 @ 9 am Reviewed signs /symptoms of infection, UTI Encouraged pt to avoid outdoor activities during extreme heat, try early morning or late afternoon, stay well hydrated  Patient Self Care Activities:  Attend all scheduled provider appointments Attend church or other social activities Call pharmacy for medication refills 3-7 days in advance of running out of medications Call provider office for new concerns or questions  Notify RN Care Manager of TOC call rescheduling needs Participate in Transition of Care Program/Attend TOC scheduled calls Perform all self care activities independently  Perform IADL's (shopping, preparing meals, housekeeping, managing finances) independently Take medications as prescribed   Continue drinking adequate fluids, preferably water  Alternate activity with rest  Plan:  Telephone outreach scheduled for - 01/19/24 @ 1015 am         Recommendation:   PCP Follow-up  Follow Up Plan:   Telephone follow-up 01/19/24 @ 1015 am  Mliss Creed [facility], BSN RN Care Manager/ Transition of Care [place]/ [facility] [phone]

## 2024-01-12 NOTE — Patient Instructions (Signed)
 Visit Information  Thank you for taking time to visit with me today. Please don't hesitate to contact me if I can be of assistance to you before our next scheduled telephone appointment.  Our next appointment is by telephone on 01/19/24 at 1015 am  Following is a copy of your care plan:   Goals Addressed             This Visit's Progress    VBCI Transitions of Care (TOC) Care Plan       Problems:  Recent Hospitalization for treatment of Sepsis secondary to urinary source Patient states he is feeling much better, getting stronger, pt has supportive spouse Patient saw primary care provider on 01/04/24, had labs completed, pt reports he is pleased that some of his numbers are trending down 01/12/24- pt reports  doing good, no issues  no new concerns reported  Goal:  Over the next 30 days, the patient will not experience hospital readmission  Interventions: Sepsis secondary to urinary source Evaluation of current treatment plan related to Sepsis secondary to urinary source, self-management and patient's adherence to plan as established by provider. Discussed plans with patient for ongoing care management follow up and provided patient with direct contact information for care management team Evaluation of current treatment plan related to Sepsis secondary to urinary source and patient's adherence to plan as established by provider Reviewed medications with patient and discussed importance of taking as prescribed Reviewed scheduled/upcoming provider appointments including cardiologist 01/26/24 @ 9 am Reviewed signs /symptoms of infection, UTI Encouraged pt to avoid outdoor activities during extreme heat, try early morning or late afternoon, stay well hydrated  Patient Self Care Activities:  Attend all scheduled provider appointments Attend church or other social activities Call pharmacy for medication refills 3-7 days in advance of running out of medications Call provider office for new  concerns or questions  Notify RN Care Manager of TOC call rescheduling needs Participate in Transition of Care Program/Attend TOC scheduled calls Perform all self care activities independently  Perform IADL's (shopping, preparing meals, housekeeping, managing finances) independently Take medications as prescribed   Continue drinking adequate fluids, preferably water  Alternate activity with rest  Plan:  Telephone outreach scheduled for - 01/19/24 @ 1015 am        Patient verbalizes understanding of instructions and care plan provided today and agrees to view in MyChart. Active MyChart status and patient understanding of how to access instructions and care plan via MyChart confirmed with patient.     Telephone follow up appointment with care management team member scheduled for:  01/19/24 @ 1015 am  Please call the care guide team at [phone] if you need to cancel or reschedule your appointment.   Please call the Suicide and Crisis Lifeline: 988 call the [place]  National Suicide Prevention Lifeline: 8545520280 or TTY: 682 185 3598 TTY 325-408-7115) to talk to a trained counselor call 1-800-273-TALK (toll free, 24 hour hotline) go to [facility] [address] [place] [phone]) call the [facility] Crisis Line: 5390251103 call 911 if you are experiencing a Mental Health or Behavioral Health Crisis or need someone to talk to.  Mliss Creed [facility], BSN RN Care Manager/ Transition of Care / [facility] [phone]

## 2024-01-19 ENCOUNTER — Other Ambulatory Visit: Payer: Self-pay | Admitting: *Deleted

## 2024-01-19 NOTE — Patient Outreach (Signed)
 Transition of Care week 4  Visit Note  01/19/2024  Name: Billy Rasmussen MRN: [id number]          DOB: May 05, 1946  Situation: Patient enrolled in [facility] 30-day program. Visit completed with patient by telephone.   Background:    Past Medical History:  Diagnosis Date   AICD (automatic cardioverter/defibrillator) present    [employer]   Anemia aprox. may-1-24   Arthritis    knees, shoulders, elbows   Bladder cancer [facility])    urologist-  dr nieves   CHF (congestive heart failure) (HCC)    Chronic kidney disease    left kidney removed   Diverticulosis of colon    Fibromyalgia    GERD (gastroesophageal reflux disease)    History of diverticulitis of colon    History of gastric ulcer    due to aleve   Hypertension    Lower urinary tract symptoms (LUTS)    Myocardial infarction (HCC) may 1-24   OSA (obstructive sleep apnea)    Wears CPAP nightly   Oxygen  deficiency    Pericarditis 05/17/2023   PONV (postoperative nausea and vomiting)    Psoriasis    Sepsis (HCC)    january 2021   Sleep apnea    Thyroid  disease    biopsy on 10-08-22   Tinnitus    right ear more, has tranmitter in right ear removable at hs   Ulcer    Wears glasses    Wears partial dentures     Assessment: Patient Reported Symptoms: Cognitive Cognitive Status: No symptoms reported, Able to follow simple commands, Alert and oriented to person, place, and time, Normal speech and language skills      Neurological Neurological Review of Symptoms: No symptoms reported    HEENT HEENT Symptoms Reported: No symptoms reported      Cardiovascular Cardiovascular Symptoms Reported: No symptoms reported    Respiratory Respiratory Symptoms Reported: No symptoms reported    Endocrine Endocrine Symptoms Reported: No symptoms reported Is patient diabetic?: No Endocrine Comment: history thyroid  cancer and thyroidectomy 2024- follows up with endocrinology on 02/17/24  Gastrointestinal Gastrointestinal  Symptoms Reported: No symptoms reported      Genitourinary Genitourinary Symptoms Reported: No symptoms reported Additional Genitourinary Details: urinary ileal conduit (2020), reports urine is light yellow, clear, no concerns reported Genitourinary Management Strategies:  (pt reports drinking adequate fluids, including water ) Genitourinary Self-Management Outcome: 4 (good)  Integumentary Integumentary Symptoms Reported: No symptoms reported    Musculoskeletal Musculoskelatal Symptoms Reviewed: No symptoms reported        Psychosocial Psychosocial Symptoms Reported: No symptoms reported         There were no vitals filed for this visit.  Medications Reviewed Today     Reviewed by Aura Mliss LABOR, RN (Registered Nurse) on 01/19/24 at 1023  Med List Status: <None>   Medication Order Taking? Sig Documenting Provider Last Dose Status Informant  Albuterol -Budesonide  90-80 MCG/ACT AERO [id number]  Take 2 puffs by mouth 4 (four) times daily as needed (shortness of breath.). [provider]  Active Spouse/Significant Other, Pharmacy Records, Other, Multiple Informants  amoxicillin -clavulanate (AUGMENTIN ) 875-125 MG tablet [id number]  Take 1 tablet by mouth every 12 (twelve) hours. Evonnie Lenis, MD  Active   doxycycline  (VIBRA -TABS) 100 MG tablet [id number]  Take 1 tablet (100 mg total) by mouth every 12 (twelve) hours. Evonnie Lenis, MD  Active   furosemide  (LASIX ) 20 MG tablet [id number]  Take 1 tablet (20 mg total) by mouth See admin instructions. Take 1  tablet (20 mg) daily as needed if you gain more than 3 pounds in 1 day or 5 pounds in 1 week. [name], [name]  Active Spouse/Significant Other, Pharmacy Records, Other, Multiple Informants  levothyroxine  (SYNTHROID ) 112 MCG tablet [id number]  Take 112 mcg by mouth every morning. [provider]  Active Pharmacy Records, Spouse/Significant Other, Other, Multiple Informants  losartan  (COZAAR ) 25 MG tablet [id number]  Take 1 tablet  (25 mg total) by mouth daily.  Patient taking differently: Take 25 mg by mouth daily. Pt is taking 12.5 mg   Kate Lonni CROME, MD  Expired 01/04/24 2359 Pharmacy Records, Spouse/Significant Other, Other, Multiple Informants  metoprolol  succinate (TOPROL -XL) 50 MG 24 hr tablet [id number]  TAKE 1 TABLET BY MOUTH EVERY DAY  Patient taking differently: Take 25 mg by mouth daily.   [name], DO  Active Spouse/Significant Other, Pharmacy Records, Other, Multiple Informants  Multiple Vitamin (MULTIVITAMIN) capsule [id number]  Take 1 capsule by mouth daily. [provider]  Active Pharmacy Records, Spouse/Significant Other, Other, Multiple Informants  polyvinyl alcohol  (LIQUIFILM TEARS) 1.4 % ophthalmic solution [id number]  Place 1 drop into both eyes as needed for dry eyes. Delilah Marliss SQUIBB, MD  Active Pharmacy Records, Spouse/Significant Other, Other, Multiple Informants           Med Note CHRISTIE ALYSON Schaumann Feb 18, 2023  4:30 PM)    spironolactone  (ALDACTONE ) 25 MG tablet [id number]  Take 12.5 mg by mouth daily. [provider]  Active Pharmacy Records, Spouse/Significant Other, Other, Multiple Informants            Goals Addressed             This Visit's Progress    VBCI Transitions of Care (TOC) Care Plan       Problems:  Recent Hospitalization for treatment of Sepsis secondary to urinary source Patient states he is feeling much better, getting stronger, pt has supportive spouse Patient saw primary care provider on 01/04/24, had labs completed, pt reports he is pleased that some of his numbers are trending down 01/19/24- pt reports  doing well, no issues  no new concerns reported  Goal:  Over the next 30 days, the patient will not experience hospital readmission  Interventions: Sepsis secondary to urinary source Evaluation of current treatment plan related to Sepsis secondary to urinary source, self-management and patient's adherence to plan as  established by provider. Discussed plans with patient for ongoing care management follow up and provided patient with direct contact information for care management team Evaluation of current treatment plan related to Sepsis secondary to urinary source and patient's adherence to plan as established by provider Reviewed medications with patient and discussed importance of taking as prescribed Reviewed scheduled/upcoming provider appointments including cardiologist 01/26/24 @ 9 am, endocrinologist 02/17/24 Reinforced signs /symptoms of infection, UTI Encouraged pt to avoid outdoor activities during extreme heat, try early morning or late afternoon, stay well hydrated  Patient Self Care Activities:  Attend all scheduled provider appointments Attend church or other social activities Call pharmacy for medication refills 3-7 days in advance of running out of medications Call provider office for new concerns or questions  Notify RN Care Manager of TOC call rescheduling needs Participate in Transition of Care Program/Attend TOC scheduled calls Perform all self care activities independently  Perform IADL's (shopping, preparing meals, housekeeping, managing finances) independently Take medications as prescribed   Continue drinking adequate fluids, preferably water  Alternate activity with rest Report any signs of infection/  UTI immediately  Plan:  Telephone outreach scheduled for - 01/25/24 @ 115 pm         Recommendation:   PCP Follow-up Specialty provider follow-up cardiology and endocrinology  Follow Up Plan:   Telephone follow-up 01/25/24 @ 115 pm  Mliss Creed [facility], BSN RN Care Manager/ Transition of Care [place]/ [facility] [phone]

## 2024-01-19 NOTE — Patient Instructions (Signed)
 Visit Information  Thank you for taking time to visit with me today. Please don't hesitate to contact me if I can be of assistance to you before our next scheduled telephone appointment.  Our next appointment is by telephone on 01/25/24 @ 115 pm  Following is a copy of your care plan:   Goals Addressed             This Visit's Progress    VBCI Transitions of Care (TOC) Care Plan       Problems:  Recent Hospitalization for treatment of Sepsis secondary to urinary source Patient states he is feeling much better, getting stronger, pt has supportive spouse Patient saw primary care provider on 01/04/24, had labs completed, pt reports he is pleased that some of his numbers are trending down 01/19/24- pt reports  doing well, no issues  no new concerns reported  Goal:  Over the next 30 days, the patient will not experience hospital readmission  Interventions: Sepsis secondary to urinary source Evaluation of current treatment plan related to Sepsis secondary to urinary source, self-management and patient's adherence to plan as established by provider. Discussed plans with patient for ongoing care management follow up and provided patient with direct contact information for care management team Evaluation of current treatment plan related to Sepsis secondary to urinary source and patient's adherence to plan as established by provider Reviewed medications with patient and discussed importance of taking as prescribed Reviewed scheduled/upcoming provider appointments including cardiologist 01/26/24 @ 9 am, endocrinologist 02/17/24 Reinforced signs /symptoms of infection, UTI Encouraged pt to avoid outdoor activities during extreme heat, try early morning or late afternoon, stay well hydrated  Patient Self Care Activities:  Attend all scheduled provider appointments Attend church or other social activities Call pharmacy for medication refills 3-7 days in advance of running out of medications Call  provider office for new concerns or questions  Notify RN Care Manager of TOC call rescheduling needs Participate in Transition of Care Program/Attend TOC scheduled calls Perform all self care activities independently  Perform IADL's (shopping, preparing meals, housekeeping, managing finances) independently Take medications as prescribed   Continue drinking adequate fluids, preferably water  Alternate activity with rest Report any signs of infection/ UTI immediately  Plan:  Telephone outreach scheduled for - 01/25/24 @ 115 pm        Patient verbalizes understanding of instructions and care plan provided today and agrees to view in MyChart. Active MyChart status and patient understanding of how to access instructions and care plan via MyChart confirmed with patient.     Telephone follow up appointment with care management team member scheduled for: 01/25/24 @ 115 pm  Please call the care guide team at [phone] if you need to cancel or reschedule your appointment.   Please call the Suicide and Crisis Lifeline: 988 call the [place]  National Suicide Prevention Lifeline: 7746128145 or TTY: 402-583-1177 TTY 252-106-7236) to talk to a trained counselor call 1-800-273-TALK (toll free, 24 hour hotline) go to [facility] [address], [place] [phone]) call the [facility] Crisis Line: [phone] call 911 if you are experiencing a Mental Health or Behavioral Health Crisis or need someone to talk to.  Mliss Creed [facility], BSN RN Care Manager/ Transition of Care [place]/ [facility] [phone]

## 2024-01-23 NOTE — Progress Notes (Unsigned)
 Cardiology Office Note:    Date:  01/26/2024   ID:  Billy Rasmussen, Billy Rasmussen 08-Mar-1946, MRN [id number]  PCP:  [name], DO  Cardiologist:  Lonni LITTIE Nanas, MD  Electrophysiologist:  Eulas FORBES Furbish, MD   Referring MD: [name], DO   No chief complaint on file.   History of Present Illness:    Billy Rasmussen is a 78 y.o. male with a hx of chronic combined systolic and diastolic heart failure, bladder cancer s/p left nephrectomy, cystoprostatectomy , fibromyalgia, hypertension, OSA not on CPAP, psoriasis who presents for follow-up.  He was initially seen as an ED follow-up on 10/06/2022.  He was seen in the ED on 09/27/2022 for shortness of breath.  Chest x-ray shows interstitial edema and mildly elevated BNP.  CTPA showed no PE but findings suggestive of pulmonary edema, as well as 4.5 cm ascending aortic aneurysm.  He was given Lasix  with improvement and discharged on p.o. Lasix  20 mg daily with referral to cardiology.  Echocardiogram 04/2019 showed EF 55-60%, ascending aortic aneurysm measuring 45 mm, normal RV function, no significant valvular disease.  At initial clinic visit on 10/06/2022, he reported fever and was hypotensive.  He was sent to the ED.  He was admitted from 5/7 through 10/13/2022.  He was treated for UTI.  Echo 10/17/2022 showed EF less than 20%, mildly reduced RV systolic dysfunction, RVSP 44, severe left atrial enlargement, mild MR, dilated ascending aorta measuring 45 mm.  RHC/LHC on 10/09/2022 showed nonobstructive CAD (30% RCA stenosis, RA 6 RV 50/3, PA 55/26/37 PCWP 27 CI 3.85.  Had AKI during admission, diuresis was held and he was discharged on as needed Lasix .  Zio patch x 10 days 10/2022 showed 1 episode of NSVT lasting 8 beats, 10 episodes of SVT with longest lasting 19 beats.  Echocardiogram 12/2022 showed EF less than 20%, grade 2 diastolic dysfunction, elevated left atrial pressure, mild RV dysfunction, mild to moderate MR,  dilated ascending aorta measuring 44 mm.  He was referred to EP and underwent BiV ICD insertion with Dr. Furbish on 03/01/2023.  Cardiac MRI 05/08/2023 showed no LGE, unable to quantify LV/RV volumes and EF due to heart defect from patient's device, dilated ascending aorta measuring 44 mm.  Echocardiogram 05/14/2023 showed EF 25 to 30%, grade 1 diastolic dysfunction, mild RV dysfunction.  He was admitted 05/2023 with chest pain.  Suspected pericarditis, he was started on aspirin  taper and colchicine .  He was admitted with urosepsis 11/2023.  Since last clinic visit, he reports he is doing well.  Denies any chest pain, dyspnea, lightheadedness, syncope, lower extremity edema, or palpitations.  Takes lasix  2-3 times per week.     BP Readings from Last 3 Encounters:  01/26/24 130/80  01/04/24 109/69  12/27/23 121/81   Wt Readings from Last 3 Encounters:  01/26/24 217 lb (98.4 kg)  01/12/24 210 lb (95.3 kg)  01/04/24 216 lb 12.8 oz (98.3 kg)     Past Medical History:  Diagnosis Date   AICD (automatic cardioverter/defibrillator) present    [employer]   Anemia aprox. may-1-24   Arthritis    knees, shoulders, elbows   Bladder cancer [facility])    urologist-  dr nieves   CHF (congestive heart failure) (HCC)    Chronic kidney disease    left kidney removed   Diverticulosis of colon    Fibromyalgia    GERD (gastroesophageal reflux disease)    History of diverticulitis of colon    History  of gastric ulcer    due to aleve   Hypertension    Lower urinary tract symptoms (LUTS)    Myocardial infarction [facility]) may 1-24   OSA (obstructive sleep apnea)    Wears CPAP nightly   Oxygen  deficiency    Pericarditis 05/17/2023   PONV (postoperative nausea and vomiting)    Psoriasis    Sepsis (HCC)    january 2021   Sleep apnea    Thyroid  disease    biopsy on 10-08-22   Tinnitus    right ear more, has tranmitter in right ear removable at hs   Ulcer    Wears glasses    Wears partial dentures      Past Surgical History:  Procedure Laterality Date   APPENDECTOMY     BIV ICD INSERTION CRT-D N/A 03/01/2023   Procedure: BIV ICD INSERTION CRT-D;  Surgeon: Nancey Eulas BRAVO, MD;  [facility];  Service: Cardiovascular;  Laterality: N/A;   COLECTOMY W/ COLOSTOMY  1996   W/   APPENDECTOMY   COLONOSCOPY N/A 05/11/2018   Procedure: COLONOSCOPY;  Surgeon: Shaaron Lamar HERO, MD;  [facility];  Service: Endoscopy;  Laterality: N/A;  9:30   COLOSTOMY TAKEDOWN  1996   CYSTOSCOPY WITH BIOPSY N/A 12/05/2013   Procedure: CYSTO BLADDER BIOPSY AND FULGERATION;  Surgeon: Donnice Brooks, MD;  Location: [facility];  Service: Urology;  Laterality: N/A;   CYSTOSCOPY WITH BIOPSY Bilateral 11/13/2014   Procedure: CYSTOSCOPY WITH  BLADDER BIOPSY FULGERATION AND BILATERAL RETROGRADE PYELOGRAMS;  Surgeon: Donnice Brooks, MD;  Location: [facility];  Service: Urology;  Laterality: Bilateral;   CYSTOSCOPY WITH FULGERATION N/A 01/18/2018   Procedure: CYSTOSCOPY WITH FULGERATION/ BLADDER BIOPSY;  Surgeon: Brooks Donnice, MD;  Location[facility];  Service: Urology;  Laterality: N/A;   CYSTOSCOPY WITH INJECTION N/A 11/09/2018   Procedure: CYSTOSCOPY WITH INJECTION OF INDOCYANINE GREEN  DYE;  Surgeon: Alvaro Hummer, MD;  Location: [facility];  Service: Urology;  Laterality: N/A;   CYSTOSCOPY WITH INSERTION OF UROLIFT N/A 01/18/2018   Procedure: CYSTOSCOPY WITH INSERTION OF UROLIFT;  Surgeon: Brooks Donnice, MD;  Location: [facility];  Service: Urology;  Laterality: N/A;   CYSTOSCOPY/URETEROSCOPY/HOLMIUM LASER Left 02/17/2019   Procedure: URETEROSCOPY WITH BALLOON DILATION  AND NEPHROSTOGRAM;  Surgeon: Alvaro Hummer, MD;  Location[facility]  Service: Urology;  Laterality: Left;  1 HR   EXCISION RIGHT UPPER ARM LIPOMA  2005   FRACTURE SURGERY     HEMORROIDECTOMY     INGUINAL HERNIA REPAIR Left 1984    IR NEPHROSTOMY PLACEMENT LEFT  01/05/2019   JOINT REPLACEMENT     both  knees, and both shoulders joints replaced.   KNEE ARTHROSCOPY Left X3  LAST ONE  2002   ORIF LEFT HUMEROUS FX  1976   POLYPECTOMY  05/11/2018   Procedure: POLYPECTOMY;  Surgeon: Shaaron Lamar HERO, MD;  [facility];  Service: Endoscopy;;   PROSTATE SURGERY     RIGHT HEART CATH AND CORONARY ANGIOGRAPHY N/A 10/09/2022   Procedure: RIGHT HEART CATH AND CORONARY ANGIOGRAPHY;  Surgeon: Wendel Lurena POUR, MD;  [facility];  Service: Cardiovascular;  Laterality: N/A;   ROBOT ASSISTED LAPAROSCOPIC NEPHRECTOMY Left 07/14/2019   Procedure: XI ROBOTIC ASSISTED LAPAROSCOPIC RETROPERITONEAL NEPHRECTOMY;  Surgeon: Alvaro Hummer, MD;  Location[facility];  Service: Urology;  Laterality: Left;  3 HRS   SMALL INTESTINE SURGERY     THYROIDECTOMY Bilateral 04/14/2023  Procedure: TOTAL THYROIDECTOMY;  Surgeon: Jesus Oliphant, MD;  [facility];  Service: ENT;  Laterality: Bilateral;   TONSILLECTOMY  AS CHILD   TOTAL KNEE ARTHROPLASTY Left 2006   REVISION 2007  (AFTER I & D WITH ANTIBIOTIC SPACER PROCEDURE FOR STEPH INFECTION)   TOTAL KNEE ARTHROPLASTY Right 03/30/2016   Procedure: RIGHT TOTAL KNEE ARTHROPLASTY;  Surgeon: Donnice Car, MD;  [facility];  Service: Orthopedics;  Laterality: Right;   TOTAL SHOULDER ARTHROPLASTY Right 04/06/2013   Procedure: RIGHT TOTAL SHOULDER ARTHROPLASTY;  Surgeon: Franky CHRISTELLA Pointer, MD;  [facility];  Service: Orthopedics;  Laterality: Right;   TOTAL SHOULDER ARTHROPLASTY Left 07/20/2013   Procedure: LEFT TOTAL SHOULDER ARTHROPLASTY;  Surgeon: Franky CHRISTELLA Pointer, MD;  [facility];  Service: Orthopedics;  Laterality: Left;   TRANSURETHRAL RESECTION OF BLADDER TUMOR N/A 11/12/2015   Procedure: TRANSURETHRAL RESECTION OF BLADDER TUMOR (TURBT);  Surgeon: Donnice Brooks, MD;  Location: [facility];  Service: Urology;  Laterality: N/A;   TRANSURETHRAL RESECTION  OF BLADDER TUMOR WITH GYRUS (TURBT-GYRUS) N/A 12/05/2013   Procedure: TRANSURETHRAL RESECTION OF BLADDER TUMOR WITH GYRUS (TURBT-GYRUS);  Surgeon: Donnice Brooks, MD;  Location: [facility];  Service: Urology;  Laterality: N/A;    Current Medications: Current Meds  Medication Sig   Albuterol -Budesonide  90-80 MCG/ACT AERO Take 2 puffs by mouth 4 (four) times daily as needed (shortness of breath.).   furosemide  (LASIX ) 20 MG tablet Take 1 tablet (20 mg total) by mouth See admin instructions. Take 1 tablet (20 mg) daily as needed if you gain more than 3 pounds in 1 day or 5 pounds in 1 week.   levothyroxine  (SYNTHROID ) 112 MCG tablet Take 112 mcg by mouth every morning.   losartan  (COZAAR ) 25 MG tablet Take 1 tablet (25 mg total) by mouth daily. (Patient taking differently: Take 25 mg by mouth daily. Pt is taking 12.5 mg)   metoprolol  succinate (TOPROL -XL) 50 MG 24 hr tablet TAKE 1 TABLET BY MOUTH EVERY DAY (Patient taking differently: Take 25 mg by mouth daily.)   Multiple Vitamin (MULTIVITAMIN) capsule Take 1 capsule by mouth daily.   polyvinyl alcohol  (LIQUIFILM TEARS) 1.4 % ophthalmic solution Place 1 drop into both eyes as needed for dry eyes.   rosuvastatin  (CRESTOR ) 5 MG tablet Take 1 tablet (5 mg total) by mouth daily.   spironolactone  (ALDACTONE ) 25 MG tablet Take 12.5 mg by mouth daily.     Allergies:   Ciprofloxacin    Social History   Socioeconomic History   Marital status: Married    Spouse name: Peggy   Number of children: 2   Years of education: 14   Highest education level: Associate degree: occupational, Scientist, product/process development, or vocational program  Occupational History   Occupation: retired    Comment: [employer], 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   Tobacco Use   Smoking status: Former    Current packs/day: 0.00    Average packs/day: 2.0 packs/day for 40.0 years (80.0 ttl pk-yrs)    Types: Cigarettes    Start date: 06/01/1957    Quit date: 06/01/1997    Years since quitting: 26.6     Passive exposure: Past   Smokeless tobacco: Never  Vaping Use   Vaping status: Never Used  Substance and Sexual Activity   Alcohol  use: No   Drug use: No   Sexual activity: Not Currently    Comment: vesectomy  Other Topics Concern   Not on file  Social History Narrative   One level home with wife  [id number] - 13 year old MIL lives with them   Caffiene 2 cups daily, tea occas.   Retired, Visual merchandiser   Social Drivers of Corporate investment banker Strain: Low Risk  (08/16/2023)   Overall Financial Resource Strain (CARDIA)    Difficulty of Paying Living Expenses: Not very hard  Recent Concern: Financial Resource Strain - Medium Risk (07/05/2023)   Overall Financial Resource Strain (CARDIA)    Difficulty of Paying Living Expenses: Somewhat hard  Food Insecurity: No Food Insecurity (12/28/2023)   Hunger Vital Sign    Worried About Running Out of Food in the Last Year: Never true    Ran Out of Food in the Last Year: Never true  Transportation Needs: No Transportation Needs (12/28/2023)   PRAPARE - Administrator, Civil Service (Medical): No    Lack of Transportation (Non-Medical): No  Physical Activity: Insufficiently Active (08/16/2023)   Exercise Vital Sign    Days of Exercise per Week: 3 days    Minutes of Exercise per Session: 10 min  Stress: No Stress Concern Present (12/10/2023)   [employer] of Occupational Health - Occupational Stress Questionnaire    Feeling of Stress: Not at all  Social Connections: Socially Integrated (12/24/2023)   Social Connection and Isolation Panel    Frequency of Communication with Friends and Family: More than three times a week    Frequency of Social Gatherings with Friends and Family: More than three times a week    Attends Religious Services: More than 4 times per year    Active Member of [employer] or Organizations: Yes    Attends Engineer, structural: More than 4 times per year    Marital Status: Married     Family  History: The patient's family history includes Alzheimer's disease in his mother; Arthritis in his father; Congestive Heart Failure in his brother; Diabetes in his daughter; Hearing loss in his father; Heart attack in his father; Heart disease in his brother and father; Hypertension in his father; Irregular heart beat in his brother. There is no history of Sleep apnea.  ROS:   Please see the history of present illness.     All other systems reviewed and are negative.  EKGs/Labs/Other Studies Reviewed:    The following studies were reviewed today:   EKG:   03/18/23: BiV paced, rate 111 10/06/22: Sinus rhythm with first-degree AV block, rate 88, nonspecific intraventricular conduction delay, right axis deviation  Recent Labs: 09/13/2023: TSH 4.190 12/24/2023: B Natriuretic Peptide 187.0 12/26/2023: Magnesium  2.2 01/04/2024: ALT 28; BUN 22; Creatinine, Ser 1.43; Hemoglobin 12.8; Platelets 224; Potassium 5.1; Sodium 137  Recent Lipid Panel    Component Value Date/Time   CHOL 137 12/17/2023 0850   TRIG 151 (H) 12/17/2023 0850   HDL 26 (L) 12/17/2023 0850   CHOLHDL 5.3 (H) 12/17/2023 0850   LDLCALC 84 12/17/2023 0850   LDLDIRECT 96 02/25/2016 1352    Physical Exam:    VS:  BP 130/80   Pulse 62   Ht 5' 6 (1.676 m)   Wt 217 lb (98.4 kg)   SpO2 95%   BMI 35.02 kg/m     Wt Readings from Last 3 Encounters:  01/26/24 217 lb (98.4 kg)  01/12/24 210 lb (95.3 kg)  01/04/24 216 lb 12.8 oz (98.3 kg)     GEN:  Well nourished, well developed in no acute distress HEENT: Normal NECK: No JVD; No carotid bruits CARDIAC: RRR, no murmurs, rubs, gallops RESPIRATORY:  Clear  to auscultation without rales, wheezing or rhonchi  ABDOMEN: Soft, non-tender, non-distended MUSCULOSKELETAL:  No edema; No deformity  SKIN: Warm and dry NEUROLOGIC:  Alert and oriented x 3 PSYCHIATRIC:  Normal affect   ASSESSMENT:    1. Chronic combined systolic and diastolic CHF (congestive heart failure) (HCC)   2.  Pure hypercholesterolemia   3. Coronary artery disease involving native coronary artery of native heart without angina pectoris   4. CKD stage 3a, GFR 45-59 ml/min (HCC)   5. Aneurysm of ascending aorta without rupture (HCC)      PLAN:    Chronic combined heart failure: Echo 10/17/2022 showed EF less than 20%, mildly reduced RV systolic dysfunction, RVSP 44, severe left atrial enlargement, mild MR, dilated ascending aorta measuring 45 mm.  RHC/LHC on 10/09/2022 showed nonobstructive CAD (30% RCA stenosis, RA 6 RV 50/3, PA 55/26/37 PCWP 27 CI 3.85.  Echocardiogram 12/2022 showed EF remains less than 20%.  He was referred to EP and underwent BiV ICD insertion with Dr. Nancey on 03/01/2023.  Cardiac MRI 05/08/2023 showed no LGE, unable to quantify LV/RV volumes and EF due to artifact from patient's device, dilated ascending aorta measuring 44 mm.   Echocardiogram 05/14/2023 showed EF 25 to 30%, grade 1 diastolic dysfunction, mild RV dysfunction. -Continue Lasix  20 mg daily as needed.  Would monitor daily weights and take Lasix  if gains more than 3 pounds in 1 day or 5 pounds in 1 week -Continue Toprol -XL 25 mg daily -Continue losartan  25 mg daily.  BP has improved, may be able to transition to Entresto.  Will check labs and if stable renal function and potassium plan to transition to Entresto 24-26 mg twice daily -Continue spironolactone  12.5 mg daily -Hold off on SGLT2 inhibitor given frequent UTIs -Update echocardiogram  Pericarditis: He was admitted 05/2023 with chest pain.  Suspected pericarditis, he was started on aspirin  taper and colchicine .  He had to stop colchicine  due to diarrhea.  Reports symptoms resolved, no recent chest pain  CAD: LHC on 10/09/2022 showed nonobstructive CAD with 30% RCA stenosis. -LDL 94 on 07/07/2022, goal less than 70.  Started atorvastatin  20 mg daily.  LDL 33 on 03/18/2023.  He is reporting muscle cramps, decreased atorvastatin  to 10 mg daily then discontinued.  LDL 84 on  11/2023.  Will add rosuvastatin  5 mg daily  Palpitations: Zio patch x 10 days 10/2022 showed 1 episode of NSVT lasting 8 beats, 10 episodes of SVT with longest lasting 19 beats. -Continue Toprol -XL 25 mg daily  Aortic aneurysm: Ascending aortic aneurysm measuring 45 mm on CTPA 08/2022.  Measured 45 mm on echocardiogram 05/2023, will monitor, planning repeat echocardiogram as above  CKD 3A: Has history of left nephrectomy due to bladder cancer.  Baseline creatinine appears 1.3-1.5.  Creatinine was up to 1.8 during admission 09/2022.  Most recent creatinine 1.43 on 01/04/2024.  Follows with nephrology.    Hypertension: On Toprol -XL, losartan , and spironolactone  as above  OSA: Reports compliance with CPAP  Thyroid  cancer: Status post thyroidectomy 04/2023  RTC in 3 months  Medication Adjustments/Labs and Tests Ordered: Current medicines are reviewed at length with the patient today.  Concerns regarding medicines are outlined above.  Orders Placed This Encounter  Procedures   Comprehensive metabolic panel with GFR   Magnesium    Lipid panel   ECHOCARDIOGRAM COMPLETE   Meds ordered this encounter  Medications   rosuvastatin  (CRESTOR ) 5 MG tablet    Sig: Take 1 tablet (5 mg total) by mouth daily.  Dispense:  90 tablet    Refill:  3    Patient Instructions  Medication Instructions:  START Crestor  5 mg daily   *If you need a refill on your cardiac medications before your next appointment, please call your pharmacy*  Lab Work: CMP MAG  LIPID PANEL IN 3 MONTHS   If you have labs (blood work) drawn today and your tests are completely normal, you will receive your results only by: MyChart Message (if you have MyChart) OR A paper copy in the mail If you have any lab test that is abnormal or we need to change your treatment, we will call you to review the results.  Testing/Procedures: ECHOCARDIOGRAM  Your physician has requested that you have an echocardiogram. Echocardiography is  a painless test that uses sound waves to create images of your heart. It provides your doctor with information about the size and shape of your heart and how well your heart's chambers and valves are working. This procedure takes approximately one hour. There are no restrictions for this procedure. Please do NOT wear cologne, perfume, aftershave, or lotions (deodorant is allowed). Please arrive 15 minutes prior to your appointment time.  Please note: We ask at that you not bring children with you during ultrasound (echo/ vascular) testing. Due to room size and safety concerns, children are not allowed in the ultrasound rooms during exams. Our front office staff cannot provide observation of children in our lobby area while testing is being conducted. An adult accompanying a patient to their appointment will only be allowed in the ultrasound room at the discretion of the ultrasound technician under special circumstances. We apologize for any inconvenience.   Follow-Up: At [facility], you and your health needs are our priority.  As part of our continuing mission to provide you with exceptional heart care, our providers are all part of one team.  This team includes your primary Cardiologist (physician) and Advanced Practice Providers or APPs (Physician Assistants and Nurse Practitioners) who all work together to provide you with the care you need, when you need it.  Your next appointment:   3 month(s)  Provider:   Lonni LITTIE Nanas, MD          Signed, Lonni LITTIE Nanas, MD  01/26/2024 12:25 PM    [facility]

## 2024-01-25 ENCOUNTER — Other Ambulatory Visit: Payer: Self-pay | Admitting: *Deleted

## 2024-01-25 NOTE — Patient Instructions (Signed)
 Visit Information  Thank you for taking time to visit with me today. Please don't hesitate to contact me if I can be of assistance to you before our next scheduled telephone appointment.  Our next appointment is no further scheduled appointments.    Following is a copy of your care plan:   Goals Addressed             This Visit's Progress    COMPLETED: VBCI Transitions of Care (TOC) Care Plan       Problems:  Recent Hospitalization for treatment of Sepsis secondary to urinary source Patient states he is feeling much better, getting stronger, pt has supportive spouse Patient saw primary care provider on 01/04/24, had labs completed, pt reports he is pleased that some of his numbers are trending down 01/25/24- pt reports  doing well, no issues  no new concerns reported  Goal:  Over the next 30 days, the patient will not experience hospital readmission  Interventions: Sepsis secondary to urinary source Evaluation of current treatment plan related to Sepsis secondary to urinary source, self-management and patient's adherence to plan as established by provider. Discussed plans with patient for ongoing care management follow up and provided patient with direct contact information for care management team Evaluation of current treatment plan related to Sepsis secondary to urinary source and patient's adherence to plan as established by provider Reviewed medications with patient and discussed importance of taking as prescribed Reviewed scheduled/upcoming provider appointments including cardiologist 01/26/24, endocrinologist 02/17/24 Reviewed signs /symptoms of infection, UTI Encouraged pt to avoid outdoor activities during extreme heat, try early morning or late afternoon, stay well hydrated Reviewed plan of care including TOC case closure  Patient Self Care Activities:  Attend all scheduled provider appointments Attend church or other social activities Call pharmacy for medication refills  3-7 days in advance of running out of medications Call provider office for new concerns or questions  Notify RN Care Manager of TOC call rescheduling needs Participate in Transition of Care Program/Attend TOC scheduled calls Perform all self care activities independently  Perform IADL's (shopping, preparing meals, housekeeping, managing finances) independently Take medications as prescribed   Continue drinking adequate fluids, preferably water  Alternate activity with rest Report any signs of infection/ UTI immediately  Plan:  Case closure        Patient verbalizes understanding of instructions and care plan provided today and agrees to view in MyChart. Active MyChart status and patient understanding of how to access instructions and care plan via MyChart confirmed with patient.     No further follow up required: case closure  Please call the care guide team at [phone] if you need to cancel or reschedule your appointment.   Please call the Suicide and Crisis Lifeline: 988 call the [place]  National Suicide Prevention Lifeline: 250-564-2461 or TTY: 437-073-6141 TTY (506)093-9595) to talk to a trained counselor call 1-800-273-TALK (toll free, 24 hour hotline) go to [facility] [address], [place] [phone]) call the [facility] Crisis Line: 647-734-2125 call 911 if you are experiencing a Mental Health or Behavioral Health Crisis or need someone to talk to.  Mliss Creed [facility] BSN RN Care Manager/ Transition of Care [place]/ [facility] [phone]

## 2024-01-25 NOTE — Transitions of Care (Post Inpatient/ED Visit) (Signed)
 Transition of Care week 5  Visit Note  01/25/2024  Name: Billy Rasmussen MRN: [id number]          DOB: 03-19-1946  Situation: Patient enrolled in [facility] 30-day program. Visit completed with patient by telephone.   Background:     Past Medical History:  Diagnosis Date   AICD (automatic cardioverter/defibrillator) present    Abbott/St Jude   Anemia aprox. may-1-24   Arthritis    knees, shoulders, elbows   Bladder cancer [facility])    urologist-  dr nieves   CHF (congestive heart failure) (HCC)    Chronic kidney disease    left kidney removed   Diverticulosis of colon    Fibromyalgia    GERD (gastroesophageal reflux disease)    History of diverticulitis of colon    History of gastric ulcer    due to aleve   Hypertension    Lower urinary tract symptoms (LUTS)    Myocardial infarction (HCC) may 1-24   OSA (obstructive sleep apnea)    Wears CPAP nightly   Oxygen  deficiency    Pericarditis 05/17/2023   PONV (postoperative nausea and vomiting)    Psoriasis    Sepsis (HCC)    january 2021   Sleep apnea    Thyroid  disease    biopsy on 10-08-22   Tinnitus    right ear more, has tranmitter in right ear removable at hs   Ulcer    Wears glasses    Wears partial dentures     Assessment: Patient Reported Symptoms: Cognitive Cognitive Status: No symptoms reported, Able to follow simple commands, Normal speech and language skills, Alert and oriented to person, place, and time      Neurological Neurological Review of Symptoms: No symptoms reported    HEENT HEENT Symptoms Reported: No symptoms reported      Cardiovascular Cardiovascular Symptoms Reported: No symptoms reported Does patient have uncontrolled Hypertension?: No Cardiovascular Management Strategies: Medication therapy, Routine screening Cardiovascular Self-Management Outcome: 4 (good) Cardiovascular Comment: Reviewed HF action plan, pt continues weighing daily  Respiratory Respiratory Symptoms Reported: No  symptoms reported    Endocrine Endocrine Symptoms Reported: No symptoms reported Is patient diabetic?: No Endocrine Comment: history thyroid  cancer and thyroidectomy 2024- follows up with endocrinology on 02/17/24  Gastrointestinal Gastrointestinal Symptoms Reported: No symptoms reported      Genitourinary Genitourinary Symptoms Reported: No symptoms reported Additional Genitourinary Details: urinary ileal conduit (2020)  reports urine is light yellow, clear, no concerns reported Genitourinary Management Strategies:  (pt reports he is drinking adequate fluids) Genitourinary Self-Management Outcome: 4 (good)  Integumentary Integumentary Symptoms Reported: No symptoms reported    Musculoskeletal Musculoskelatal Symptoms Reviewed: No symptoms reported        Psychosocial Psychosocial Symptoms Reported: No symptoms reported   Major Change/Loss/Stressor/Fears (CP): Denies     There were no vitals filed for this visit.  Medications Reviewed Today     Reviewed by Aura Mliss LABOR, RN (Registered Nurse) on 01/25/24 at 1339  Med List Status: <None>   Medication Order Taking? Sig Documenting Provider Last Dose Status Informant  Albuterol -Budesonide  90-80 MCG/ACT AERO [id number]  Take 2 puffs by mouth 4 (four) times daily as needed (shortness of breath.). [provider]  Active Spouse/Significant Other, Pharmacy Records, Other, Multiple Informants  amoxicillin -clavulanate (AUGMENTIN ) 875-125 MG tablet [id number]  Take 1 tablet by mouth every 12 (twelve) hours. Evonnie Lenis, MD  Active   doxycycline  (VIBRA -TABS) 100 MG tablet [id number]  Take 1 tablet (100 mg total) by mouth every  12 (twelve) hours. Evonnie Lenis, MD  Active   furosemide  (LASIX ) 20 MG tablet [id number]  Take 1 tablet (20 mg total) by mouth See admin instructions. Take 1 tablet (20 mg) daily as needed if you gain more than 3 pounds in 1 day or 5 pounds in 1 week. [name], [name]  Active Spouse/Significant Other, Pharmacy  Records, Other, Multiple Informants  levothyroxine  (SYNTHROID ) 112 MCG tablet [id number]  Take 112 mcg by mouth every morning. [provider]  Active Pharmacy Records, Spouse/Significant Other, Other, Multiple Informants  losartan  (COZAAR ) 25 MG tablet [id number]  Take 1 tablet (25 mg total) by mouth daily.  Patient taking differently: Take 25 mg by mouth daily. Pt is taking 12.5 mg   Kate Lonni CROME, MD  Expired 01/04/24 2359 Pharmacy Records, Spouse/Significant Other, Other, Multiple Informants  metoprolol  succinate (TOPROL -XL) 50 MG 24 hr tablet [id number]  TAKE 1 TABLET BY MOUTH EVERY DAY  Patient taking differently: Take 25 mg by mouth daily.   [name], DO  Active Spouse/Significant Other, Pharmacy Records, Other, Multiple Informants  Multiple Vitamin (MULTIVITAMIN) capsule [id number]  Take 1 capsule by mouth daily. [provider]  Active Pharmacy Records, Spouse/Significant Other, Other, Multiple Informants  polyvinyl alcohol  (LIQUIFILM TEARS) 1.4 % ophthalmic solution [id number]  Place 1 drop into both eyes as needed for dry eyes. Delilah Marliss SQUIBB, MD  Active Pharmacy Records, Spouse/Significant Other, Other, Multiple Informants           Med Note CHRISTIE ALYSON Schaumann Feb 18, 2023  4:30 PM)    spironolactone  (ALDACTONE ) 25 MG tablet [id number]  Take 12.5 mg by mouth daily. [provider]  Active Pharmacy Records, Spouse/Significant Other, Other, Multiple Informants            Recommendation:   PCP Follow-up  Follow Up Plan:   Closing From:  Transitions of Care Program  Mliss Creed [facility], BSN RN Care Manager/ Transition of Care [place]/ [facility] [phone]

## 2024-01-26 ENCOUNTER — Encounter: Payer: Self-pay | Admitting: Oncology

## 2024-01-26 ENCOUNTER — Other Ambulatory Visit (HOSPITAL_COMMUNITY): Payer: Self-pay

## 2024-01-26 ENCOUNTER — Ambulatory Visit: Attending: Cardiovascular Disease | Admitting: Cardiology

## 2024-01-26 VITALS — BP 130/80 | HR 62 | Ht 66.0 in | Wt 217.0 lb

## 2024-01-26 DIAGNOSIS — E7800 Pure hypercholesterolemia, unspecified: Secondary | ICD-10-CM

## 2024-01-26 DIAGNOSIS — I7121 Aneurysm of the ascending aorta, without rupture: Secondary | ICD-10-CM | POA: Diagnosis not present

## 2024-01-26 DIAGNOSIS — I2510 Atherosclerotic heart disease of native coronary artery without angina pectoris: Secondary | ICD-10-CM

## 2024-01-26 DIAGNOSIS — I5042 Chronic combined systolic (congestive) and diastolic (congestive) heart failure: Secondary | ICD-10-CM

## 2024-01-26 DIAGNOSIS — N1831 Chronic kidney disease, stage 3a: Secondary | ICD-10-CM | POA: Diagnosis not present

## 2024-01-26 LAB — COMPREHENSIVE METABOLIC PANEL WITH GFR
ALT: 24 IU/L (ref 0–44)
AST: 46 IU/L — ABNORMAL HIGH (ref 0–40)
Albumin: 4.7 g/dL (ref 3.8–4.8)
Alkaline Phosphatase: 84 IU/L (ref 44–121)
BUN/Creatinine Ratio: 14 (ref 10–24)
BUN: 22 mg/dL (ref 8–27)
Bilirubin Total: 0.7 mg/dL (ref 0.0–1.2)
CO2: 23 mmol/L (ref 20–29)
Calcium: 10 mg/dL (ref 8.6–10.2)
Chloride: 99 mmol/L (ref 96–106)
Creatinine, Ser: 1.55 mg/dL — ABNORMAL HIGH (ref 0.76–1.27)
Globulin, Total: 3.2 g/dL (ref 1.5–4.5)
Glucose: 81 mg/dL (ref 70–99)
Potassium: 4.9 mmol/L (ref 3.5–5.2)
Sodium: 138 mmol/L (ref 134–144)
Total Protein: 7.9 g/dL (ref 6.0–8.5)
eGFR: 46 mL/min/1.73 — ABNORMAL LOW (ref >59)

## 2024-01-26 LAB — MAGNESIUM: Magnesium: 2.2 mg/dL (ref 1.6–2.3)

## 2024-01-26 MED ORDER — ROSUVASTATIN CALCIUM 5 MG PO TABS
5.0000 mg | ORAL_TABLET | Freq: Every day | ORAL | 3 refills | Status: DC
  Filled 2024-01-26: qty 90, 90d supply, fill #0

## 2024-01-26 NOTE — Patient Instructions (Signed)
 Medication Instructions:  START Crestor  5 mg daily   *If you need a refill on your cardiac medications before your next appointment, please call your pharmacy*  Lab Work: CMP MAG  LIPID PANEL IN 3 MONTHS   If you have labs (blood work) drawn today and your tests are completely normal, you will receive your results only by: MyChart Message (if you have MyChart) OR A paper copy in the mail If you have any lab test that is abnormal or we need to change your treatment, we will call you to review the results.  Testing/Procedures: ECHOCARDIOGRAM  Your physician has requested that you have an echocardiogram. Echocardiography is a painless test that uses sound waves to create images of your heart. It provides your doctor with information about the size and shape of your heart and how well your heart's chambers and valves are working. This procedure takes approximately one hour. There are no restrictions for this procedure. Please do NOT wear cologne, perfume, aftershave, or lotions (deodorant is allowed). Please arrive 15 minutes prior to your appointment time.  Please note: We ask at that you not bring children with you during ultrasound (echo/ vascular) testing. Due to room size and safety concerns, children are not allowed in the ultrasound rooms during exams. Our front office staff cannot provide observation of children in our lobby area while testing is being conducted. An adult accompanying a patient to their appointment will only be allowed in the ultrasound room at the discretion of the ultrasound technician under special circumstances. We apologize for any inconvenience.   Follow-Up: At [facility] you and your health needs are our priority.  As part of our continuing mission to provide you with exceptional heart care, our providers are all part of one team.  This team includes your primary Cardiologist (physician) and Advanced Practice Providers or APPs (Physician Assistants  and Nurse Practitioners) who all work together to provide you with the care you need, when you need it.  Your next appointment:   3 month(s)  Provider:   Lonni LITTIE Nanas, MD

## 2024-01-27 ENCOUNTER — Ambulatory Visit: Payer: Self-pay | Admitting: Cardiology

## 2024-01-27 DIAGNOSIS — I10 Essential (primary) hypertension: Secondary | ICD-10-CM

## 2024-02-01 ENCOUNTER — Other Ambulatory Visit: Payer: Self-pay | Admitting: *Deleted

## 2024-02-01 MED ORDER — SACUBITRIL-VALSARTAN 24-26 MG PO TABS: 1.0000 | ORAL_TABLET | Freq: Two times a day (BID) | ORAL | 3 refills | Status: DC

## 2024-02-01 NOTE — Progress Notes (Signed)
 Made patient aware to stop Losartan  and start Entresto  24-26 bid per Dr. Kate and BMET in one week. Understanding Verbalized.

## 2024-02-02 ENCOUNTER — Other Ambulatory Visit: Payer: Self-pay | Admitting: Cardiology

## 2024-02-07 ENCOUNTER — Telehealth: Payer: Self-pay | Admitting: Adult Health

## 2024-02-07 ENCOUNTER — Ambulatory Visit: Payer: Self-pay | Admitting: Family Medicine

## 2024-02-07 NOTE — Telephone Encounter (Signed)
 FYI Only or Action Required?: FYI only for provider.  Patient was last seen in primary care on 01/04/2024 by Deitra Morton Sebastian Nena, NP.  Called Nurse Triage reporting swelling.  Symptoms began today.  Interventions attempted: Rest, hydration, or home remedies.  Symptoms are: gradually improving.  Triage Disposition: See PCP When Office is Open (Within 3 Days)  Patient/caregiver understands and will follow disposition?: Yes            Reason for Disposition  [1] MODERATE neck pain (e.g., interferes with normal activities) AND [2] present > 3 days    No swelling now but soreness.  Answer Assessment - Initial Assessment Questions Appt scheduled for 02/09/24. Patient reports if sx do not return will call back and cancel appt. Recommended if sx return or worsen call back.     1. ONSET: When did the pain begin?      This am  2. LOCATION: Where does it hurt?      Left jaw area  3. PATTERN Does the pain come and go, or has it been constant since it started?      Swollen gland this am and now has went down not as painful 4. SEVERITY: How bad is the pain?  (Scale 0-10; or none or slight stiffness, mild, moderate, severe)     Did not rate. Pain has went down 5. RADIATION: Does the pain go anywhere else, shoot into your arms?     na 6. CORD SYMPTOMS: Any weakness or numbness of the arms or legs?     na 7. CAUSE: What do you think is causing the neck pain?     Not sure  8. NECK OVERUSE: Any recent activities that involved turning or twisting the neck?     no 9. OTHER SYMPTOMS: Do you have any other symptoms? (e.g., headache, fever, chest pain, difficulty breathing, neck swelling)     Left neck area under jaw with swelling this am golf ball size with pain. Has since went down and patient denies fever, no other sx .  10. PREGNANCY: Is there any chance you are pregnant? When was your last menstrual period?       na  Protocols used: Neck Pain or  Stiffness-A-AH

## 2024-02-07 NOTE — Telephone Encounter (Signed)
 Called patient to review sx of jaw swelling and pain. Patient unable to review sx requesting NT to call back in 10 minutes. Reviewed with patient NT will call back but unknown amount of time and patient is welcome to call back when he finishes other phone call .      woke up w/swollen gland at the jaw on his right if facing him.  swallowing his uncomfortable

## 2024-02-07 NOTE — Telephone Encounter (Signed)
 Appointment scheduled 02/09/2024

## 2024-02-07 NOTE — Telephone Encounter (Addendum)
 Second attempt; no answer Three attempts made, no answer; sent to office     FYI Only or Action Required?: FYI only for provider.  Patient was last seen in primary care on 01/04/2024 by Deitra Morton Sebastian Nena, NP.  Called Nurse Triage reporting swelling.  Symptoms began today.  Interventions attempted: Rest, hydration, or home remedies.  Symptoms are: gradually improving.  Triage Disposition: See PCP When Office is Open (Within 3 Days)  Patient/caregiver understands and will follow disposition?: Yes            Reason for Disposition  [1] MODERATE neck pain (e.g., interferes with normal activities) AND [2] present > 3 days    No swelling now but soreness.  Answer Assessment - Initial Assessment Questions Appt scheduled for 02/09/24. Patient reports if sx do not return will call back and cancel appt. Recommended if sx return or worsen call back.     1. ONSET: When did the pain begin?      This am  2. LOCATION: Where does it hurt?      Left jaw area  3. PATTERN Does the pain come and go, or has it been constant since it started?      Swollen gland this am and now has went down not as painful 4. SEVERITY: How bad is the pain?  (Scale 0-10; or none or slight stiffness, mild, moderate, severe)     Did not rate. Pain has went down 5. RADIATION: Does the pain go anywhere else, shoot into your arms?     na 6. CORD SYMPTOMS: Any weakness or numbness of the arms or legs?     na 7. CAUSE: What do you think is causing the neck pain?     Not sure  8. NECK OVERUSE: Any recent activities that involved turning or twisting the neck?     no 9. OTHER SYMPTOMS: Do you have any other symptoms? (e.g., headache, fever, chest pain, difficulty breathing, neck swelling)     Left neck area under jaw with swelling this am golf ball size with pain. Has since went down and patient denies fever, no other sx .  10. PREGNANCY: Is there any chance you are pregnant?  When was your last menstrual period?       na  Protocols used: Neck Pain or Stiffness-A-AH

## 2024-02-07 NOTE — Telephone Encounter (Signed)
 Received call from specialist, attempted warm transfer, call disconnected.

## 2024-02-07 NOTE — Telephone Encounter (Signed)
 Patient called to get contact information for DME. Gave patient phone number to [employer].

## 2024-02-08 ENCOUNTER — Telehealth: Payer: Self-pay | Admitting: Cardiology

## 2024-02-08 ENCOUNTER — Other Ambulatory Visit (HOSPITAL_COMMUNITY): Payer: Self-pay

## 2024-02-08 ENCOUNTER — Telehealth: Payer: Self-pay | Admitting: Pharmacy Technician

## 2024-02-08 NOTE — Telephone Encounter (Signed)
 Pharmacy Patient Advocate Encounter  Insurance verification completed.   The patient is insured through [employer] claim for Entresto  24-26MG  BRAND. Currently a quantity of 60 is a 30 day supply and the co-pay is 250.91 .   This test claim was processed through [facility]- copay amounts may vary at other pharmacies due to pharmacy/plan contracts, or as the patient moves through the different stages of their insurance plan.

## 2024-02-08 NOTE — Telephone Encounter (Signed)
 Yes lets switch him from Entresto  back to losartan  25 mg daily

## 2024-02-08 NOTE — Telephone Encounter (Signed)
 Medication name/dosage: Samples List: Entresto  24/26 mg  Administration instructions: Take one tablet by mouth twice daily  Reason for samples: Reason for samples: unable to afford medication  Ordering provider: Dr. Kate  *Once above information entered, route the phone encounter to CV DIV MAG ST SAMPLES and send Teams message to team member assigned to Samples for the day.

## 2024-02-08 NOTE — Telephone Encounter (Signed)
 Pt c/o medication issue:  1. Name of Medication:   Entresto   2. How are you currently taking this medication (dosage and times per day)?   Not taking  3. Are you having a reaction (difficulty breathing--STAT)?   4. What is your medication issue?    Patient stated Dr. Kate wanted him to start this medication but it is very expensive.  Patient wants to know if he can get samples or assistance getting this medication.

## 2024-02-09 ENCOUNTER — Encounter: Payer: Self-pay | Admitting: Family Medicine

## 2024-02-09 ENCOUNTER — Ambulatory Visit (INDEPENDENT_AMBULATORY_CARE_PROVIDER_SITE_OTHER): Admitting: Family Medicine

## 2024-02-09 VITALS — BP 125/62 | HR 60 | Temp 97.8°F | Ht 66.0 in | Wt 219.0 lb

## 2024-02-09 DIAGNOSIS — K1120 Sialoadenitis, unspecified: Secondary | ICD-10-CM | POA: Diagnosis not present

## 2024-02-09 MED ORDER — AMOXICILLIN-POT CLAVULANATE 875-125 MG PO TABS: 1.0000 | ORAL_TABLET | Freq: Two times a day (BID) | ORAL | 0 refills | Status: DC

## 2024-02-09 NOTE — Progress Notes (Signed)
 Subjective: RR:hojwi swelling PCP: Billy Rasmussen HERO, DO YEP:Ryjmozd [name] is a 78 y.o. male presenting to clinic today for:  Discussed the use of AI scribe software for clinical note transcription with the patient, who gave verbal consent to proceed.  History of Present Illness   Billy Rasmussen is a 78 year old male who presents with swelling and tenderness of the submandibular gland.  He has experienced swelling and tenderness in the submandibular gland area for the past three days. The swelling is described as the size of a golf ball, particularly noticeable in the mornings. Tenderness is more pronounced upon waking and is not associated with eating. He uses a CPAP machine, which involves rolling up a towel to keep his mouth shut, but he does not believe this is related to the current issue. no fevers reported.  He reports drainage from his left eye, which has been occurring for the same duration as the gland swelling. He notes that looking down helps if he does not want to focus.  Regarding his medication history, he mentions a past experience with antibiotics causing night sweats, specifically referencing Cipro .  He discusses a recent prescription for Entresto , a heart medication, which he has not started due to its high cost.      ROS: Per HPI  Allergies  Allergen Reactions   Ciprofloxacin  Itching   Past Medical History:  Diagnosis Date   AICD (automatic cardioverter/defibrillator) present    Abbott/St Jude   Anemia aprox. may-1-24   Arthritis    knees, shoulders, elbows   Bladder cancer [facility])    urologist-  dr nieves   CHF (congestive heart failure) (HCC)    Chronic kidney disease    left kidney removed   Diverticulosis of colon    Fibromyalgia    GERD (gastroesophageal reflux disease)    History of diverticulitis of colon    History of gastric ulcer    due to aleve   Hypertension    Lower urinary tract symptoms (LUTS)    Myocardial  infarction (HCC) may 1-24   OSA (obstructive sleep apnea)    Wears CPAP nightly   Oxygen  deficiency    Pericarditis 05/17/2023   PONV (postoperative nausea and vomiting)    Psoriasis    Sepsis (HCC)    january 2021   Sleep apnea    Thyroid  disease    biopsy on 10-08-22   Tinnitus    right ear more, has tranmitter in right ear removable at hs   Ulcer    Wears glasses    Wears partial dentures     Current Outpatient Medications:    Albuterol -Budesonide  90-80 MCG/ACT AERO, Take 2 puffs by mouth 4 (four) times daily as needed (shortness of breath.)., Disp: , Rfl:    amoxicillin -clavulanate (AUGMENTIN ) 875-125 MG tablet, Take 1 tablet by mouth 2 (two) times daily., Disp: 20 tablet, Rfl: 0   furosemide  (LASIX ) 20 MG tablet, Take 1 tablet (20 mg total) by mouth See admin instructions. Take 1 tablet (20 mg) daily as needed if you gain more than 3 pounds in 1 day or 5 pounds in 1 week., Disp: 90 tablet, Rfl: 1   levothyroxine  (SYNTHROID ) 112 MCG tablet, Take 112 mcg by mouth every morning., Disp: , Rfl:    metoprolol  succinate (TOPROL -XL) 50 MG 24 hr tablet, TAKE 1 TABLET BY MOUTH EVERY DAY, Disp: 90 tablet, Rfl: 0   Multiple Vitamin (MULTIVITAMIN) capsule, Take 1 capsule by mouth daily., Disp: , Rfl:  polyvinyl alcohol  (LIQUIFILM TEARS) 1.4 % ophthalmic solution, Place 1 drop into both eyes as needed for dry eyes., Disp: 15 mL, Rfl: 0   rosuvastatin  (CRESTOR ) 5 MG tablet, Take 1 tablet (5 mg total) by mouth daily., Disp: 90 tablet, Rfl: 3   spironolactone  (ALDACTONE ) 25 MG tablet, Take 12.5 mg by mouth daily., Disp: , Rfl:    sacubitril -valsartan  (ENTRESTO ) 24-26 MG, Take 1 tablet by mouth 2 (two) times daily. (Patient not taking: Reported on 02/09/2024), Disp: 180 tablet, Rfl: 3 Social History   Socioeconomic History   Marital status: Married    Spouse name: Billy Rasmussen   Number of children: 2   Years of education: 14   Highest education level: Tax adviser degree: occupational, Scientist, product/process development, or  vocational program  Occupational History   Occupation: retired    Comment: [employer], 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   Tobacco Use   Smoking status: Former    Current packs/day: 0.00    Average packs/day: 2.0 packs/day for 40.0 years (80.0 ttl pk-yrs)    Types: Cigarettes    Start date: 06/01/1957    Quit date: 06/01/1997    Years since quitting: 26.7    Passive exposure: Past   Smokeless tobacco: Never  Vaping Use   Vaping status: Never Used  Substance and Sexual Activity   Alcohol  use: No   Drug use: No   Sexual activity: Not Currently    Comment: vesectomy  Other Topics Concern   Not on file  Social History Narrative   One level home with wife    62/2940 - 83 year old MIL lives with them   Caffiene 2 cups daily, tea occas.   Retired, Visual merchandiser   Social Drivers of Corporate investment banker Strain: Low Risk  (08/16/2023)   Overall Financial Resource Strain (CARDIA)    Difficulty of Paying Living Expenses: Not very hard  Recent Concern: Financial Resource Strain - Medium Risk (07/05/2023)   Overall Financial Resource Strain (CARDIA)    Difficulty of Paying Living Expenses: Somewhat hard  Food Insecurity: No Food Insecurity (12/28/2023)   Hunger Vital Sign    Worried About Running Out of Food in the Last Year: Never true    Ran Out of Food in the Last Year: Never true  Transportation Needs: No Transportation Needs (12/28/2023)   PRAPARE - Administrator, Civil Service (Medical): No    Lack of Transportation (Non-Medical): No  Physical Activity: Insufficiently Active (08/16/2023)   Exercise Vital Sign    Days of Exercise per Week: 3 days    Minutes of Exercise per Session: 10 min  Stress: No Stress Concern Present (12/10/2023)   [employer] of Occupational Health - Occupational Stress Questionnaire    Feeling of Stress: Not at all  Social Connections: Socially Integrated (12/24/2023)   Social Connection and Isolation Panel    Frequency of Communication with Friends and  Family: More than three times a week    Frequency of Social Gatherings with Friends and Family: More than three times a week    Attends Religious Services: More than 4 times per year    Active Member of [employer] or Organizations: Yes    Attends Banker Meetings: More than 4 times per year    Marital Status: Married  Catering manager Violence: Not At Risk (12/28/2023)   Humiliation, Afraid, Rape, and Kick questionnaire    Fear of Current or Ex-Partner: No    Emotionally Abused: No    Physically Abused: No  Sexually Abused: No   Family History  Problem Relation Age of Onset   Alzheimer's disease Mother    Heart attack Father    Heart disease Father    Arthritis Father    Hearing loss Father    Hypertension Father    Irregular heart beat Brother        DEFIB.   Congestive Heart Failure Brother    Heart disease Brother    Diabetes Daughter    Sleep apnea Neg Hx     Objective: Office vital signs reviewed. BP 125/62   Pulse 60   Temp 97.8 F (36.6 C)   Ht 5' 6 (1.676 m)   Wt 219 lb (99.3 kg)   SpO2 98%   BMI 35.35 kg/m   Physical Examination:  General: Awake, alert, well nourished, No acute distress HEENT: mild left sided submandibular gland enlargement. No appreciable fluctuance/ erythema or warmth but it is tender to palpation. No associated LAD  Assessment/ Plan: 78 y.o. male   Submandibular sialoadenitis - Plan: amoxicillin -clavulanate (AUGMENTIN ) 875-125 MG tablet  Assessment and Plan    Left submandibular sialoadenitis Swelling and tenderness suggest obstruction or infection. Cancer unlikely due to tenderness and acute presentation. - Prescribe Augmentin . - Encourage sour and tart foods to stimulate salivation. - Advise increased water  intake. - Schedule follow-up in 10 days. - Consider imaging if no improvement post-antibiotics. - Instruct to call if symptoms worsen.        Rasmussen CHRISTELLA Fielding, DO [facility] [phone]

## 2024-02-09 NOTE — Patient Instructions (Signed)
 Salivary Gland Infection  A salivary gland infection is an infection in one or more of the glands that make saliva. There are six major salivary glands. Each gland has a tube (duct) that carries saliva into the mouth. Saliva keeps the mouth moist and breaks down the food that a person eats. It also helps prevent tooth decay. You have two salivary glands just in front of your ears (parotid). The ducts for these glands open up inside your cheeks, near your back teeth. You also have two glands under your tongue (sublingual) and two glands under your jaw (submandibular). The ducts for these glands open under your tongue. Any salivary gland can get infected. Most infections occur in the parotid glands or submandibular glands. What are the causes? This condition may be caused by bacteria or viruses. The bacteria that cause salivary gland infections are usually the same bacteria that normally live in your mouth. A stone can form in a salivary gland and block the flow of saliva. Bacteria may then start to grow behind the blockage and cause infection. Bacterial infections usually cause pain and swelling on one side of the face. Submandibular gland swelling occurs under the jaw. Parotid swelling occurs in front of the ear. Bacterial infections are more common in adults. The most common cause of viral salivary gland infections is the mumps virus. However, vaccination has made mumps rare. This infection causes swelling in both parotid glands. Viral infections are more common in children. What increases the risk? You are more likely to develop a bacterial infection if: You do not take good care of your mouth and teeth (have poor oral hygiene). You smoke. You do not drink enough water. You have a disease that causes dry mouth and dry eyes (Mikulicz syndrome or Sjgren syndrome). A viral infection is more likely to occur in children who do not get the measles, mumps, and rubella (MMR) vaccine. What are the  signs or symptoms? The main sign of a salivary gland infection is sialadenitis, which is swelling in a salivary gland. You may have swelling in front of your ear, under your jaw, or under your tongue. Swelling may get worse when you eat and decrease after you eat. Other signs and symptoms include: Pain. Tenderness. Redness. Dry mouth. Bad taste in your mouth. Trouble chewing and swallowing. Fever. How is this diagnosed? This condition may be diagnosed based on: Your signs and symptoms. A physical exam. During the exam, your health care provider will look and feel inside your mouth to see whether a stone is blocking a salivary gland duct. Tests, such as: An X-ray to check for a stone. An ultrasound, CT scan, or MRI to look for an infected gland (abscess) and to rule out other causes of swelling. A culture and sensitivity test. In this test, a sample of pus is taken from the salivary gland by using a swab or a needle (aspiration). The sample is tested in a lab to determine the type of bacteria involved and which antibiotic medicines will work against it. You may need to see an ear, nose, and throat specialist (ENT or otolaryngologist) for diagnosis and treatment. How is this treated? Viral salivary gland infections usually clear up without treatment. Bacterial infections are usually treated with antibiotic medicine. Severe infections that cause trouble swallowing may be treated with an IV antibiotic in the hospital. Other treatments may include: Probing and widening the salivary duct to let a stone pass. In some cases, a thin, flexible scope (endoscope) may be  put into the duct to find a stone and remove it. Breaking up a stone using sound waves. Draining an abscess with a needle. Surgery to: Remove a stone. Drain pus from an abscess. Remove a gland that has a bad or long-term infection. Follow these instructions at home: Medicines Take over-the-counter and prescription medicines only as  told by your health care provider. If you were prescribed an antibiotic medicine, take it as told by your health care provider. Do not stop taking the antibiotic even if you start to feel better. To relieve discomfort Follow these instructions every few hours: Suck on a lemon candy or a vitamin C lozenge to prompt the flow of saliva. Put a warm, wet cloth (warmcompress) over the gland. Gently massage the gland. Gargle with a mixture of salt and water 3-4 times a day or as needed. To make salt water, completely dissolve -1 tsp (3-6 g) of salt in 1 cup (237 mL) of warm water. General instructions  Practice good oral hygiene by brushing and flossing your teeth after meals and before you go to bed. Drink enough fluid to keep your urine pale yellow. Do not use any products that contain nicotine or tobacco. These products include cigarettes, chewing tobacco, and vaping devices, such as e-cigarettes. If you need help quitting, ask your health care provider. Keep all follow-up visits. This is important. Contact a health care provider if: You have pain and swelling in your face, jaw, or mouth after eating. You keep having swelling in any of these places: In front of your ear. Under your jaw. Inside your mouth. Get help right away if: You have pain and swelling in your face, jaw, or mouth that suddenly gets worse. Your pain and swelling make it hard to swallow, talk, or breathe. These symptoms may be an emergency. Get help right away. Call 911. Do not wait to see if the symptoms will go away. Do not drive yourself to the hospital. Summary A salivary gland infection is an infection in one or more of the glands that make saliva. Any salivary gland can get infected. This condition may be caused by bacteria or viruses. Salivary gland infections caused by a virus usually clear up without treatment. Bacterial infections are usually treated with antibiotic medicine. This information is not intended to  replace advice given to you by your health care provider. Make sure you discuss any questions you have with your health care provider. Document Revised: 05/14/2021 Document Reviewed: 05/14/2021 Elsevier Patient Education  2024 [employer].

## 2024-02-10 ENCOUNTER — Ambulatory Visit (HOSPITAL_COMMUNITY)
Admission: RE | Admit: 2024-02-10 | Discharge: 2024-02-10 | Disposition: A | Discharge status: Home / Self Care | Source: Ambulatory Visit | Attending: Cardiology | Admitting: Cardiology

## 2024-02-10 DIAGNOSIS — I5042 Chronic combined systolic (congestive) and diastolic (congestive) heart failure: Secondary | ICD-10-CM | POA: Insufficient documentation

## 2024-02-10 LAB — ECHOCARDIOGRAM COMPLETE
Area-P 1/2: 2.83 cm2
S' Lateral: 4 cm

## 2024-02-10 NOTE — Progress Notes (Signed)
*  PRELIMINARY RESULTS* Echocardiogram 2D Echocardiogram has been performed.  Billy Rasmussen 02/10/2024, 10:33 AM

## 2024-02-11 NOTE — Telephone Encounter (Signed)
 Called and made patient aware of Dr. Kate recommendations and patient verbalized an understanding.

## 2024-02-14 ENCOUNTER — Telehealth: Payer: Self-pay | Admitting: *Deleted

## 2024-02-14 ENCOUNTER — Other Ambulatory Visit: Payer: Self-pay | Admitting: Cardiology

## 2024-02-14 MED ORDER — LOSARTAN POTASSIUM 25 MG PO TABS: 25.0000 mg | ORAL_TABLET | Freq: Every day | ORAL | 3 refills | Status: AC

## 2024-02-14 NOTE — Telephone Encounter (Signed)
 Made patient aware per DR. Kate to stop Entresto  and start [place]

## 2024-02-15 MED ORDER — SPIRONOLACTONE 25 MG PO TABS: 12.5000 mg | ORAL_TABLET | Freq: Every day | ORAL | 3 refills | Status: AC

## 2024-02-17 DIAGNOSIS — C73 Malignant neoplasm of thyroid gland: Secondary | ICD-10-CM | POA: Diagnosis not present

## 2024-02-17 DIAGNOSIS — E039 Hypothyroidism, unspecified: Secondary | ICD-10-CM | POA: Diagnosis not present

## 2024-02-18 ENCOUNTER — Ambulatory Visit (HOSPITAL_COMMUNITY)
Admission: RE | Admit: 2024-02-18 | Discharge: 2024-02-18 | Disposition: A | Discharge status: Home / Self Care | Source: Ambulatory Visit | Attending: Family Medicine | Admitting: Family Medicine

## 2024-02-18 ENCOUNTER — Ambulatory Visit (INDEPENDENT_AMBULATORY_CARE_PROVIDER_SITE_OTHER): Admitting: Family Medicine

## 2024-02-18 ENCOUNTER — Ambulatory Visit: Payer: Self-pay | Admitting: Family Medicine

## 2024-02-18 ENCOUNTER — Encounter: Payer: Self-pay | Admitting: Family Medicine

## 2024-02-18 VITALS — BP 126/64 | HR 76 | Temp 97.7°F | Ht 66.0 in | Wt 220.2 lb

## 2024-02-18 DIAGNOSIS — K1120 Sialoadenitis, unspecified: Secondary | ICD-10-CM

## 2024-02-18 DIAGNOSIS — Z9089 Acquired absence of other organs: Secondary | ICD-10-CM | POA: Diagnosis not present

## 2024-02-18 DIAGNOSIS — C679 Malignant neoplasm of bladder, unspecified: Secondary | ICD-10-CM | POA: Insufficient documentation

## 2024-02-18 DIAGNOSIS — M778 Other enthesopathies, not elsewhere classified: Secondary | ICD-10-CM | POA: Diagnosis not present

## 2024-02-18 DIAGNOSIS — Z9889 Other specified postprocedural states: Secondary | ICD-10-CM

## 2024-02-18 DIAGNOSIS — N1831 Chronic kidney disease, stage 3a: Secondary | ICD-10-CM | POA: Diagnosis not present

## 2024-02-18 LAB — POCT I-STAT CREATININE: Creatinine, Ser: 1.5 mg/dL — ABNORMAL HIGH (ref 0.61–1.24)

## 2024-02-18 MED ORDER — IOHEXOL 300 MG/ML  SOLN
75.0000 mL | Freq: Once | INTRAMUSCULAR | Status: AC | PRN
  Administered 2024-02-18: 75 mL via INTRAVENOUS

## 2024-02-18 NOTE — Progress Notes (Addendum)
 Subjective: CC: Follow-up submandibular gland pain and swelling PCP: [name], DO YEP:Billy Rasmussen is a 77 y.o. male presenting to clinic today for:  History of Present Illness  Patient was seen 10 days ago for submandibular gland and swelling on the left.  It was concerning for sialoadenitis.  He was instructed to take oral antibiotics and increase sour foods in efforts to promote salivary gland function.  He presents today and notes that it remains tender but has gotten slightly smaller.  He reports no fevers.  He has had persistent tearing of the left eye.  Medical history significant for thyroid  nodules and bladder cancer.  Also reporting some left-sided great toe pain that was worse yesterday after prolonged walking.  This is apparently occurred before.  He typically wears supportive footwear.  He denies any gross swelling, erythema or warmth of the great toe.  Cannot take oral NSAIDs but was considering Voltaren  gel topically.  Has an appointment with podiatry on Tuesday    ROS: Per HPI  Allergies  Allergen Reactions   Ciprofloxacin  Itching   Past Medical History:  Diagnosis Date   AICD (automatic cardioverter/defibrillator) present    [employer]   Anemia aprox. may-1-24   Arthritis    knees, shoulders, elbows   Bladder cancer [facility])    urologist-  dr nieves   CHF (congestive heart failure) (HCC)    Chronic kidney disease    left kidney removed   Diverticulosis of colon    Fibromyalgia    GERD (gastroesophageal reflux disease)    History of diverticulitis of colon    History of gastric ulcer    due to aleve   Hypertension    Lower urinary tract symptoms (LUTS)    Myocardial infarction (HCC) may 1-24   OSA (obstructive sleep apnea)    Wears CPAP nightly   Oxygen  deficiency    Pericarditis 05/17/2023   PONV (postoperative nausea and vomiting)    Psoriasis    Sepsis (HCC)    january 2021   Sleep apnea    Thyroid  disease    biopsy  on 10-08-22   Tinnitus    right ear more, has tranmitter in right ear removable at hs   Ulcer    Wears glasses    Wears partial dentures     Current Outpatient Medications:    Albuterol -Budesonide  90-80 MCG/ACT AERO, Take 2 puffs by mouth 4 (four) times daily as needed (shortness of breath.)., Disp: , Rfl:    amoxicillin -clavulanate (AUGMENTIN ) 875-125 MG tablet, Take 1 tablet by mouth 2 (two) times daily., Disp: 20 tablet, Rfl: 0   furosemide  (LASIX ) 20 MG tablet, Take 1 tablet (20 mg total) by mouth See admin instructions. Take 1 tablet (20 mg) daily as needed if you gain more than 3 pounds in 1 day or 5 pounds in 1 week., Disp: 90 tablet, Rfl: 1   levothyroxine  (SYNTHROID ) 112 MCG tablet, Take 112 mcg by mouth every morning., Disp: , Rfl:    losartan  (COZAAR ) 25 MG tablet, Take 1 tablet (25 mg total) by mouth daily., Disp: 90 tablet, Rfl: 3   metoprolol  succinate (TOPROL -XL) 50 MG 24 hr tablet, TAKE 1 TABLET BY MOUTH EVERY DAY, Disp: 90 tablet, Rfl: 0   Multiple Vitamin (MULTIVITAMIN) capsule, Take 1 capsule by mouth daily., Disp: , Rfl:    polyvinyl alcohol  (LIQUIFILM TEARS) 1.4 % ophthalmic solution, Place 1 drop into both eyes as needed for dry eyes., Disp: 15 mL, Rfl: 0  rosuvastatin  (CRESTOR ) 5 MG tablet, Take 1 tablet (5 mg total) by mouth daily., Disp: 90 tablet, Rfl: 3   spironolactone  (ALDACTONE ) 25 MG tablet, Take 0.5 tablets (12.5 mg total) by mouth daily., Disp: 90 tablet, Rfl: 3 Social History   Socioeconomic History   Marital status: Married    Spouse name: Peggy   Number of children: 2   Years of education: 14   Highest education level: Associate degree: occupational, Scientist, product/process development, or vocational program  Occupational History   Occupation: retired    Comment: Pharmacologist, 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   Tobacco Use   Smoking status: Former    Current packs/day: 0.00    Average packs/day: 2.0 packs/day for 40.0 years (80.0 ttl pk-yrs)    Types: Cigarettes    Start date: 06/01/1957     Quit date: 06/01/1997    Years since quitting: 26.7    Passive exposure: Past   Smokeless tobacco: Never  Vaping Use   Vaping status: Never Used  Substance and Sexual Activity   Alcohol  use: No   Drug use: No   Sexual activity: Not Currently    Comment: vesectomy  Other Topics Concern   Not on file  Social History Narrative   One level home with wife    19/229 - 37 year old MIL lives with them   Caffiene 2 cups daily, tea occas.   Retired, Visual merchandiser   Social Drivers of Corporate investment banker Strain: Low Risk  (08/16/2023)   Overall Financial Resource Strain (CARDIA)    Difficulty of Paying Living Expenses: Not very hard  Recent Concern: Financial Resource Strain - Medium Risk (07/05/2023)   Overall Financial Resource Strain (CARDIA)    Difficulty of Paying Living Expenses: Somewhat hard  Food Insecurity: No Food Insecurity (12/28/2023)   Hunger Vital Sign    Worried About Running Out of Food in the Last Year: Never true    Ran Out of Food in the Last Year: Never true  Transportation Needs: No Transportation Needs (12/28/2023)   PRAPARE - Administrator, Civil Service (Medical): No    Lack of Transportation (Non-Medical): No  Physical Activity: Insufficiently Active (08/16/2023)   Exercise Vital Sign    Days of Exercise per Week: 3 days    Minutes of Exercise per Session: 10 min  Stress: No Stress Concern Present (12/10/2023)   [employer] of Occupational Health - Occupational Stress Questionnaire    Feeling of Stress: Not at all  Social Connections: Socially Integrated (12/24/2023)   Social Connection and Isolation Panel    Frequency of Communication with Friends and Family: More than three times a week    Frequency of Social Gatherings with Friends and Family: More than three times a week    Attends Religious Services: More than 4 times per year    Active Member of [employer] or Organizations: Yes    Attends Engineer, structural: More than 4 times per  year    Marital Status: Married  Catering manager Violence: Not At Risk (12/28/2023)   Humiliation, Afraid, Rape, and Kick questionnaire    Fear of Current or Ex-Partner: No    Emotionally Abused: No    Physically Abused: No    Sexually Abused: No   Family History  Problem Relation Age of Onset   Alzheimer's disease Mother    Heart attack Father    Heart disease Father    Arthritis Father    Hearing loss Father    Hypertension Father  Irregular heart beat Brother        DEFIB.   Congestive Heart Failure Brother    Heart disease Brother    Diabetes Daughter    Sleep apnea Neg Hx     Objective: Office vital signs reviewed. BP 126/64   Pulse 76   Temp 97.7 F (36.5 C)   Ht 5' 6 (1.676 m)   Wt 220 lb 4 oz (99.9 kg)   SpO2 97%   BMI 35.55 kg/m   Physical Examination:  General: Awake, alert, nontoxic male, No acute distress HEENT: Persistent tenderness and slight enlargement of the left submandibular gland.  Sclera white.  No excessive tearing appreciated on exam today MSK: Left great toe without any erythema, warmth or joint effusion.  Pain reproduced with resisted extension and flexion along the extensor tendon  Assessment/ Plan: 78 y.o. male   Submandibular sialoadenitis - Plan: CT SOFT TISSUE NECK WO CONTRAST, CT Soft Tissue Neck W Contrast  Malignant neoplasm of urinary bladder, unspecified site (HCC) - Plan: CT SOFT TISSUE NECK WO CONTRAST, CT Soft Tissue Neck W Contrast  History of thyroidectomy - Plan: CT SOFT TISSUE NECK WO CONTRAST, CT Soft Tissue Neck W Contrast  Extensor tendinitis of foot  Assessment & Plan   Stat CT of the neck to further evaluate this persistent pain and glandular enlargement, particular in the setting of history of thyroid  nodule status post total thyroidectomy and bladder cancer  Suspect that he has tendinopathy of that left extensor system.  Okay to follow-up with podiatry and discussed may need orthotics.  Okay to proceed with  topical Voltaren     Norene CHRISTELLA Fielding, DO [facility] [phone]

## 2024-02-18 NOTE — Addendum Note (Signed)
 Addended by: [name] on: 02/18/2024 09:13 AM   Modules accepted: Orders

## 2024-02-20 ENCOUNTER — Other Ambulatory Visit: Payer: Self-pay

## 2024-02-20 ENCOUNTER — Emergency Department (HOSPITAL_COMMUNITY)
Admission: EM | Admit: 2024-02-20 | Discharge: 2024-02-20 | Disposition: A | Discharge status: Home / Self Care | Attending: Emergency Medicine | Admitting: Emergency Medicine

## 2024-02-20 ENCOUNTER — Emergency Department (HOSPITAL_COMMUNITY)

## 2024-02-20 DIAGNOSIS — Z79899 Other long term (current) drug therapy: Secondary | ICD-10-CM | POA: Diagnosis not present

## 2024-02-20 DIAGNOSIS — M79675 Pain in left toe(s): Secondary | ICD-10-CM | POA: Diagnosis not present

## 2024-02-20 DIAGNOSIS — M7989 Other specified soft tissue disorders: Secondary | ICD-10-CM | POA: Diagnosis not present

## 2024-02-20 DIAGNOSIS — M19072 Primary osteoarthritis, left ankle and foot: Secondary | ICD-10-CM | POA: Diagnosis not present

## 2024-02-20 DIAGNOSIS — M109 Gout, unspecified: Secondary | ICD-10-CM

## 2024-02-20 MED ORDER — PREDNISONE 10 MG PO TABS: 40.0000 mg | ORAL_TABLET | Freq: Every day | ORAL | 0 refills | Status: AC

## 2024-02-20 MED ORDER — HYDROCODONE-ACETAMINOPHEN 5-325 MG PO TABS: 1.0000 | ORAL_TABLET | Freq: Four times a day (QID) | ORAL | 0 refills | Status: DC | PRN

## 2024-02-20 MED ORDER — HYDROCODONE-ACETAMINOPHEN 5-325 MG PO TABS
1.0000 | ORAL_TABLET | Freq: Once | ORAL | Status: AC
  Administered 2024-02-20: 1 via ORAL
  Filled 2024-02-20: qty 1

## 2024-02-20 NOTE — Discharge Instructions (Signed)
 Please follow-up closely with your primary care doctor on an outpatient basis.  Please take all medications as directed.  Return to emergency department immediately for any new or worsening symptoms.

## 2024-02-20 NOTE — ED Provider Notes (Signed)
 [place] EMERGENCY DEPARTMENT AT [facility] Provider Note   CSN: [id number] Arrival date & time: 02/20/24  9088     Patient presents with: Toe Pain   Billy Rasmussen is a 78 y.o. male.   Patient is a 78 year old male who presents emergency department the chief complaint of pain to the left first MTP joint.  Patient notes that pain has been ongoing for approximate the past 3 days.  The pain is worse with ambulation and improves with rest.  He is concerned about gout at this point.  He denies any history of gout.  He has had no recent falls or blunt trauma.  He has had no associated fever or chills.  He denies any numbness or paresthesias.  He does have some redness over the affected site with no extension up his foot.   Toe Pain       Prior to Admission medications   Medication Sig Start Date End Date Taking? Authorizing Provider  Albuterol -Budesonide  90-80 MCG/ACT AERO Take 2 puffs by mouth 4 (four) times daily as needed (shortness of breath.). 11/30/23   [provider]  amoxicillin -clavulanate (AUGMENTIN ) 875-125 MG tablet Take 1 tablet by mouth 2 (two) times daily. 02/09/24   [name], DO  furosemide  (LASIX ) 20 MG tablet Take 1 tablet (20 mg total) by mouth See admin instructions. Take 1 tablet (20 mg) daily as needed if you gain more than 3 pounds in 1 day or 5 pounds in 1 week. 10/26/23   [name], [name]  levothyroxine  (SYNTHROID ) 112 MCG tablet Take 112 mcg by mouth every morning.    [provider]  losartan  (COZAAR ) 25 MG tablet Take 1 tablet (25 mg total) by mouth daily. 02/14/24   Kate Lonni CROME, MD  metoprolol  succinate (TOPROL -XL) 50 MG 24 hr tablet TAKE 1 TABLET BY MOUTH EVERY DAY 12/08/23   Jolinda Norene M, DO  Multiple Vitamin (MULTIVITAMIN) capsule Take 1 capsule by mouth daily.    [provider]  polyvinyl alcohol  (LIQUIFILM TEARS) 1.4 % ophthalmic solution Place 1 drop into both eyes as needed  for dry eyes. 05/16/19   Sheth, Marliss SQUIBB, MD  rosuvastatin  (CRESTOR ) 5 MG tablet Take 1 tablet (5 mg total) by mouth daily. 01/26/24 04/25/24  Kate Lonni CROME, MD  spironolactone  (ALDACTONE ) 25 MG tablet Take 0.5 tablets (12.5 mg total) by mouth daily. 02/15/24   Kate Lonni CROME, MD    Allergies: Ciprofloxacin     Review of Systems  Musculoskeletal:        Pain to left foot  All other systems reviewed and are negative.   Updated Vital Signs BP (!) 135/52   Pulse 65   Temp 97.9 F (36.6 C) (Oral)   Resp 17   Ht 5' 6 (1.676 m)   Wt 99.3 kg   SpO2 98%   BMI 35.35 kg/m   Physical Exam Vitals and nursing note reviewed.  Constitutional:      General: He is not in acute distress.    Appearance: Normal appearance. He is not ill-appearing.  HENT:     Head: Normocephalic and atraumatic.  Eyes:     Extraocular Movements: Extraocular movements intact.     Conjunctiva/sclera: Conjunctivae normal.     Pupils: Pupils are equal, round, and reactive to light.  Cardiovascular:     Rate and Rhythm: Normal rate and regular rhythm.     Pulses: Normal pulses.     Heart sounds: Normal heart sounds.  Pulmonary:     Effort: Pulmonary effort is normal.  Musculoskeletal:        General: Normal range of motion.     Comments: Tender to palpation noted over the left first MTP joint, mild tenderness palpation over the left great toe, mild overlying erythema, nontender palpation of remainder of left foot, ankle, knee, hip, DP and PT pulses are 2+ distally, sensation intact distally, no obvious deformity or bruising, no skin breakdown or ulceration, no lacerations or abrasions, no areas of induration or fluctuance  Skin:    General: Skin is warm and dry.  Neurological:     General: No focal deficit present.     Mental Status: He is alert and oriented to person, place, and time. Mental status is at baseline.     (all labs ordered are listed, but only abnormal results are  displayed) Labs Reviewed - No data to display  EKG: None  Radiology: CT Soft Tissue Neck W Contrast Result Date: 02/18/2024 CLINICAL DATA:  Submandibular sialoadenitis EXAM: CT NECK WITH CONTRAST TECHNIQUE: Multidetector CT imaging of the neck was performed using the standard protocol following the bolus administration of intravenous contrast. RADIATION DOSE REDUCTION: This exam was performed according to the departmental dose-optimization program which includes automated exposure control, adjustment of the mA and/or kV according to patient size and/or use of iterative reconstruction technique. CONTRAST:  75mL OMNIPAQUE  IOHEXOL  300 MG/ML  SOLN COMPARISON:  None Available. FINDINGS: Pharynx: The nasopharynx, oropharynx and hypopharynx are normal Oral cavity/floor of mouth: Normal Larynx: Normal Salivary glands: The parotid and submandibular glands are normal. No stones are identified in the submandibular gland on either side. No submandibular gland swelling. There is no stone in the floor of the mouth. Thyroid : Previous resection Lymph nodes: No adenopathy Vascular: No significant abnormality Limited intracranial: No significant abnormality Visualized orbits: No significant abnormality Mastoids and visualized paranasal sinuses: No significant abnormality Skeleton: Cervical spondylosis. Upper chest: No significant abnormality Other: None IMPRESSION: There is no submandibular gland swelling or stone identified. No abnormal mass, adenopathy or inflammatory process. Electronically Signed   By: Nancyann Burns M.D.   On: 02/18/2024 10:51     Procedures   Medications Ordered in the ED  HYDROcodone -acetaminophen  (NORCO/VICODIN) 5-325 MG per tablet 1 tablet (has no administration in time range)                                    Medical Decision Making Amount and/or Complexity of Data Reviewed Radiology: ordered.  Risk Prescription drug management.   This patient presents to the ED for concern of  pain and swelling to left first MTP joint differential diagnosis includes gout, arthritis, fracture, contusion, septic joint, cellulitis, abscess    Additional history obtained:  Additional history obtained from none External records from outside source obtained and reviewed including none    Imaging Studies ordered:  I ordered imaging studies including x-ray of left foot I independently visualized and interpreted imaging which showed no acute osseous injury or lesions I agree with the radiologist interpretation   Medicines ordered and prescription drug management:  I ordered medication including Norco for gout Reevaluation of the patient after these medicines showed that the patient improved I have reviewed the patients home medicines and have made adjustments as needed   Problem List / ED Course:  Patient is doing well at this time and is stable for discharge home.  Discussed with patient  that we will treat him for gout at this point.  Low suspicion for septic joint, cellulitis, abscess formation at this time.  X-rays were unremarked for any signs of acute osseous injury or lesions.  Vital signs are stable with no indication for sepsis.  He has strong peripheral pulses with no indication for acute arterial occlusion.  He has no signs of ulceration.  Do not suspect antibiotics are warranted at this time.  Close follow-up with primary care doctor was discussed as well as strict turn precautions for any new or worsening symptoms.  Patient voiced understanding and had no additional questions.   Social Determinants of Health:  None        Final diagnoses:  None    ED Discharge Orders     None          Daralene Lonni JONETTA DEVONNA 02/20/24 1110    Dean Clarity, MD 02/20/24 1528

## 2024-02-20 NOTE — ED Notes (Signed)
PA at bedside during triage.

## 2024-02-20 NOTE — ED Triage Notes (Signed)
 Pt c/o left great toe pain x 3 days. No injury noted.

## 2024-02-22 DIAGNOSIS — M10072 Idiopathic gout, left ankle and foot: Secondary | ICD-10-CM | POA: Diagnosis not present

## 2024-02-22 DIAGNOSIS — M79672 Pain in left foot: Secondary | ICD-10-CM | POA: Diagnosis not present

## 2024-02-28 ENCOUNTER — Other Ambulatory Visit (HOSPITAL_COMMUNITY): Payer: Self-pay | Admitting: Nurse Practitioner

## 2024-02-28 ENCOUNTER — Encounter: Payer: Self-pay | Admitting: *Deleted

## 2024-02-28 DIAGNOSIS — C73 Malignant neoplasm of thyroid gland: Secondary | ICD-10-CM

## 2024-03-02 ENCOUNTER — Ambulatory Visit

## 2024-03-02 ENCOUNTER — Ambulatory Visit (INDEPENDENT_AMBULATORY_CARE_PROVIDER_SITE_OTHER): Payer: Medicare HMO

## 2024-03-02 DIAGNOSIS — Z23 Encounter for immunization: Secondary | ICD-10-CM

## 2024-03-02 DIAGNOSIS — I5042 Chronic combined systolic (congestive) and diastolic (congestive) heart failure: Secondary | ICD-10-CM | POA: Diagnosis not present

## 2024-03-03 ENCOUNTER — Ambulatory Visit: Attending: Cardiovascular Disease | Admitting: Cardiovascular Disease

## 2024-03-03 ENCOUNTER — Encounter: Payer: Self-pay | Admitting: Cardiovascular Disease

## 2024-03-03 VITALS — BP 104/56 | HR 68 | Ht 63.0 in | Wt 222.2 lb

## 2024-03-03 DIAGNOSIS — Z950 Presence of cardiac pacemaker: Secondary | ICD-10-CM | POA: Diagnosis not present

## 2024-03-03 LAB — CUP PACEART REMOTE DEVICE CHECK
Battery Remaining Longevity: 90 mo
Battery Remaining Percentage: 87 %
Battery Voltage: 2.99 V
Brady Statistic AP VP Percent: 24 %
Brady Statistic AP VS Percent: 1 %
Brady Statistic AS VP Percent: 75 %
Brady Statistic AS VS Percent: 1 %
Brady Statistic RA Percent Paced: 24 %
Date Time Interrogation Session: 20251002020657
HighPow Impedance: 80 Ohm
Implantable Lead Connection Status: 753985
Implantable Lead Connection Status: 753985
Implantable Lead Connection Status: 753985
Implantable Lead Implant Date: 20240930
Implantable Lead Implant Date: 20240930
Implantable Lead Implant Date: 20240930
Implantable Lead Location: 753858
Implantable Lead Location: 753859
Implantable Lead Location: 753860
Implantable Pulse Generator Implant Date: 20240930
Lead Channel Impedance Value: 1400 Ohm
Lead Channel Impedance Value: 410 Ohm
Lead Channel Impedance Value: 530 Ohm
Lead Channel Pacing Threshold Amplitude: 0.5 V
Lead Channel Pacing Threshold Amplitude: 0.75 V
Lead Channel Pacing Threshold Amplitude: 1.5 V
Lead Channel Pacing Threshold Pulse Width: 0.4 ms
Lead Channel Pacing Threshold Pulse Width: 0.4 ms
Lead Channel Pacing Threshold Pulse Width: 0.5 ms
Lead Channel Sensing Intrinsic Amplitude: 11.8 mV
Lead Channel Sensing Intrinsic Amplitude: 2.3 mV
Lead Channel Setting Pacing Amplitude: 1.5 V
Lead Channel Setting Pacing Amplitude: 1.75 V
Lead Channel Setting Pacing Amplitude: 2 V
Lead Channel Setting Pacing Pulse Width: 0.4 ms
Lead Channel Setting Pacing Pulse Width: 0.4 ms
Lead Channel Setting Sensing Sensitivity: 0.5 mV
Pulse Gen Serial Number: 211030215
Zone Setting Status: 755011

## 2024-03-03 NOTE — Patient Instructions (Addendum)
 Medication Instructions:  Continue all current medications.   Labwork: none  Testing/Procedures: none  Follow-Up: 6 months   Any Other Special Instructions Will Be Listed Below (If Applicable).   If you need a refill on your cardiac medications before your next appointment, please call your pharmacy.

## 2024-03-03 NOTE — Progress Notes (Signed)
 Electrophysiology Office Note:    Date:  03/03/2024   ID:  Leone, Putman November 03, 1945, MRN [id number]  PCP:  [name], DO   [facility] Providers Cardiologist:  Lonni LITTIE Nanas, MD Electrophysiologist:  Eulas FORBES Furbish, MD     Referring MD: [name], DO   History of Present Illness:    Billy Rasmussen is a 78 y.o. male with a medical history significant for chronic heart failure with reduced ejection fraction cancer status post left nephrectomy, cystoprostatectomy, obstructive sleep apnea not on CPAP, referred for possible CRT device.     He was diagnosed with heart failure in May 2024.  Ejection fraction was less than 20%.  Noted to have a left bundle branch block with a QRS greater than 160.  Coronary angiogram did not show significant coronary disease.  Is unable to have an MRI due to presence of metal in his eye.  He was discharged on metoprolol  XL.  Losartan  was added and follow-up on May 24.  Spironolactone  was added in follow-up on May 30.   When I last saw him, he was being evaluated for thyroid  cancer and myeloproliferative disorder. He has since had a thyroidectomy. He has a history of bladder cancer with cystectomy and an ileocecal conduit and colostomy.     He reports today that he is doing well and feels at baseline.  No acute complaints.  He does have shortness of breath and fatigue.  Since his last visit he had an echocardiogram that shows persistently depressed EF despite medical therapy.  EKGs/Labs/Other Studies Reviewed Today:    Echocardiogram:  TTE 09/2022 Ejection fraction less than 20%, mild LVH, slow flow in the LV apex -- potential thrombus in formation. Severely dilated left atrium. Degenerative MV with mild MR.  TTE 01/25/2023 Ejection fraction less than 20% with global hypokinesis.  Grade 2 diastolic dysfunction.  Markedly abnormal RV free wall strain.  Monitors:  Zio 10 day monitor 10/2022  -- my interpretation Sinus rhythm 47 217 bpm, average 74.  Less than 1% ectopy.  Few brief episodes of SVT.  Stress testing:   Advanced imaging:    Cardiac catherization  10/09/2022 RHC/LHV Reviewed in epic  EKG:   EKG Interpretation Date/Time:  Friday March 03 2024 15:14:46 EDT Ventricular Rate:  65 PR Interval:  208 QRS Duration:  166 QT Interval:  498 QTC Calculation: 517 R Axis:   115  Text Interpretation: Atrial-sensed ventricular-paced rhythm When compared with ECG of 21-Oct-2023 10:23, No significant change was found Confirmed by Furbish Eulas [phone]) on 03/03/2024 3:24:05 PM     Physical Exam:    VS:  BP (!) 104/56 (BP Location: Left Arm, Patient Position: Sitting, Cuff Size: Large)   Pulse 68   Ht 5' 3 (1.6 m)   Wt 222 lb 3.2 oz (100.8 kg)   SpO2 96%   BMI 39.36 kg/m     Wt Readings from Last 3 Encounters:  03/03/24 222 lb 3.2 oz (100.8 kg)  02/20/24 219 lb (99.3 kg)  02/18/24 220 lb 4 oz (99.9 kg)     GEN:  Well nourished, well developed in no acute distress CARDIAC: RRR, no murmurs, rubs, gallops RESPIRATORY:  Normal work of breathing MUSCULOSKELETAL: no edema    ASSESSMENT & PLAN:    Nonischemic cardiomyopathy, CHF reduced EF EF is less than 20% at the time of diagnosis in May He has been on GDMT since Oct 23, 2022 (losartan  added to Metop XL) Spironolactone   started 10/29/2022 No SG LT 2 inhibitor due to frequent UTIs EF has remained persistently low He underwent placement of a BiV ICD March 01, 2023.  Left bundle branch block QRS greater than 160 Possible LBBB - induced cardiomyopathy       Signed, Eulas FORBES Furbish, MD  03/03/2024 3:30 PM    [facility]

## 2024-03-06 NOTE — Progress Notes (Signed)
 Remote ICD Transmission

## 2024-03-07 ENCOUNTER — Ambulatory Visit (HOSPITAL_COMMUNITY)
Admission: RE | Admit: 2024-03-07 | Discharge: 2024-03-07 | Disposition: A | Discharge status: Home / Self Care | Source: Ambulatory Visit | Attending: Nurse Practitioner | Admitting: Nurse Practitioner

## 2024-03-07 ENCOUNTER — Other Ambulatory Visit: Payer: Self-pay | Admitting: Family Medicine

## 2024-03-07 DIAGNOSIS — C73 Malignant neoplasm of thyroid gland: Secondary | ICD-10-CM | POA: Diagnosis not present

## 2024-03-07 DIAGNOSIS — Z9009 Acquired absence of other part of head and neck: Secondary | ICD-10-CM | POA: Diagnosis not present

## 2024-03-09 NOTE — Progress Notes (Signed)
 Remote ICD Transmission

## 2024-03-27 ENCOUNTER — Ambulatory Visit: Payer: Self-pay

## 2024-03-27 NOTE — Telephone Encounter (Signed)
 Apt tomorrow. LS

## 2024-03-27 NOTE — Telephone Encounter (Signed)
 FYI Only or Action Required?: FYI only for provider.  Patient was last seen in primary care on 02/18/2024 by [name], DO.  Called Nurse Triage reporting Pain.  Symptoms began x 3 weeks and worsening.  Interventions attempted: Nothing.  Symptoms are: unchanged.  Triage Disposition: See Dentist Within 24 Hours  Patient/caregiver understands and will follow disposition?: Yes   Copied from CRM #[id number]. Topic: Clinical - Red Word Triage >> Mar 27, 2024  8:55 AM Billy Rasmussen wrote: Red Word that prompted transfer to Nurse Triage: Patient called in wanting to schedule with Dr. Jolinda asap. States his right jaw in the muscle feels like it has a bump in it and it stings a lot. Reason for Disposition  [1] Face swelling AND [2] no fever  Answer Assessment - Initial Assessment Questions 1. ONSET: When did your mouth start hurting? (e.g., hours or days ago)      X 3 weeks bump/lump right jaw muscle part 2. SEVERITY: How bad is the pain? (Scale 1-10; or mild, moderate or severe)     Moderate to severe 3. SORES: Are there any sores or ulcers in the mouth? If Yes, ask: What part of the mouth are the sores in?     no 4. FEVER: Do you have a fever? If Yes, ask: What is your temperature, how was it measured, and when did it start?     no 5. CAUSE: What do you think is causing the mouth pain?     Tongue, throat white coating, left eye runny  6. OTHER SYMPTOMS: Do you have any other symptoms? (e.g., difficulty breathing)     no  Protocols used: Mouth Pain-A-AH

## 2024-03-28 ENCOUNTER — Ambulatory Visit (INDEPENDENT_AMBULATORY_CARE_PROVIDER_SITE_OTHER): Admitting: Family Medicine

## 2024-03-28 ENCOUNTER — Encounter: Payer: Self-pay | Admitting: Family Medicine

## 2024-03-28 VITALS — BP 109/62 | HR 70 | Temp 98.0°F | Ht 66.0 in | Wt 222.4 lb

## 2024-03-28 DIAGNOSIS — K115 Sialolithiasis: Secondary | ICD-10-CM | POA: Diagnosis not present

## 2024-03-28 DIAGNOSIS — H04202 Unspecified epiphora, left lacrimal gland: Secondary | ICD-10-CM | POA: Diagnosis not present

## 2024-03-28 MED ORDER — AMOXICILLIN-POT CLAVULANATE 875-125 MG PO TABS: 1.0000 | ORAL_TABLET | Freq: Two times a day (BID) | ORAL | 0 refills | Status: DC

## 2024-03-28 NOTE — Patient Instructions (Signed)
Salivary Stone  A salivary stone is a small cluster of mineral (mineral deposit) that builds up in the tubes (ducts) that drain the salivary glands. Most salivary stones are made of calcium. When a stone forms, saliva can back up into the gland and cause painful swelling. Your salivary glands are the glands that make saliva. You have six major salivary glands. Each gland has a duct that carries saliva into your mouth. Saliva keeps your mouth moist and breaks down the food that you eat. It also helps prevent tooth decay. Two salivary glands are found just in front of your ears (parotid). The ducts for these glands open up inside your cheeks, near your back teeth. You also have two glands under your tongue (sublingual) and two glands under your jaw (submandibular). The ducts for these glands open under your tongue. A stone can form in any salivary gland. The most common place for a salivary stone to form is in a submandibular salivary gland. What are the causes? Salivary stones may be caused by any condition that lessens the flow of saliva. It is not known why some people get stones. What increases the risk? You are more likely to develop this condition if: You do not drink enough water. You smoke. You have any of these: High blood pressure. Gout. Diabetes. What are the signs or symptoms? The main sign of a salivary stone is sudden swelling of a salivary gland during eating. This usually happens under the jaw on one side. Other signs and symptoms may include: Swelling of the cheek or under the tongue during eating. Pain in the swollen area. Trouble chewing or swallowing. Swelling that goes down after eating. Sometimes, the salivary stone may be seen. The stone is oval in shape and may be white or yellow in color. How is this diagnosed? This condition may be diagnosed based on: Your signs and symptoms. A physical exam. In many cases, your health care provider will be able to feel the stone in a  duct inside your mouth. Imaging studies, such as: X-rays. Ultrasound. CT scan. MRI. You may need to see an ear, nose, and throat specialist (ENT or otolaryngologist) for diagnosis and treatment. How is this treated? Treatment for this condition depends on the size of the stone. A small stone that is not causing symptoms may be treated with home care. A stone that is large enough to cause symptoms may be treated by: Probing and widening of the duct to let the stone pass. Putting a thin, flexible scope (endoscope) into the duct to find and remove the stone. Breaking up the stone with sound waves. Removing the whole salivary gland. Follow these instructions at home: To relieve discomfort Take NSAIDs, such as ibuprofen, to help relieve pain and swelling as told by your health care provider. Follow these instructions every few hours: Suck on a lemon candy or a vitamin C lozenge to prompt the flow of saliva. Put a warm, damp cloth (warmcompress) over the gland. Gently massage the gland. General instructions  Take over-the-counter and prescription medicines only as told by your health care provider. Drink enough fluid to keep your urine pale yellow. Do not use any products that contain nicotine or tobacco. These products include cigarettes, chewing tobacco, and vaping devices, such as e-cigarettes. If you need help quitting, ask your health care provider. Keep all follow-up visits. This is important. Contact a health care provider if: You have pain and swelling in your face, jaw, or mouth after eating. You  keep having swelling in any of these places: In front of your ear. Under your jaw. Inside your mouth. Get help right away if: You have pain and swelling in your face, jaw, or mouth, that suddenly gets worse. Your pain and swelling make it hard to swallow, talk, or breathe. These symptoms may be an emergency. Get help right away. Call 911. Do not wait to see if the symptoms will go  away. Do not drive yourself to the hospital. Summary A salivary stone is a small clump of mineral (mineral deposit) that builds up in the ducts that drain your salivary glands. When a stone forms, saliva can back up into the gland and cause painful swelling. Salivary stones may be caused by any condition that lessens the flow of saliva. Treatment for this condition depends on the size of the stone. This information is not intended to replace advice given to you by your health care provider. Make sure you discuss any questions you have with your health care provider. Document Revised: 05/14/2021 Document Reviewed: 05/14/2021 Elsevier Patient Education  2024 [employer].

## 2024-03-28 NOTE — Progress Notes (Signed)
 Subjective: CC: Cheek pain PCP: [name], DO YEP:Billy Rasmussen is a 78 y.o. male presenting to clinic today for:  Patient reports that he had onset of right sided cheek pain over the last several days.  He ate something sour and it seemed to make the pain a little bit more prominent.  He feels like his right cheek is tender to touch and this is exactly where his CPAP strap sits.  He also reports left-sided eye watering that is been profuse but denies any redness.  No visual disturbance but sometimes when he is looking down it becomes bothersome because it will not stop tearing.  He has not called his eye doctor about that yet   ROS: Per HPI  Allergies  Allergen Reactions   Ciprofloxacin  Itching   Past Medical History:  Diagnosis Date   AICD (automatic cardioverter/defibrillator) present    [employer]/St Jude   Anemia aprox. may-1-24   Arthritis    knees, shoulders, elbows   Bladder cancer [facility])    urologist-  dr nieves   CHF (congestive heart failure) (HCC)    Chronic kidney disease    left kidney removed   Diverticulosis of colon    Fibromyalgia    GERD (gastroesophageal reflux disease)    History of diverticulitis of colon    History of gastric ulcer    due to aleve   Hypertension    Lower urinary tract symptoms (LUTS)    Myocardial infarction (HCC) may 1-24   OSA (obstructive sleep apnea)    Wears CPAP nightly   Oxygen  deficiency    Pericarditis 05/17/2023   PONV (postoperative nausea and vomiting)    Psoriasis    Sepsis (HCC)    january 2021   Sleep apnea    Thyroid  disease    biopsy on 10-08-22   Tinnitus    right ear more, has tranmitter in right ear removable at hs   Ulcer    Wears glasses    Wears partial dentures     Current Outpatient Medications:    Albuterol -Budesonide  90-80 MCG/ACT AERO, Take 2 puffs by mouth 4 (four) times daily as needed (shortness of breath.)., Disp: , Rfl:    furosemide  (LASIX ) 20 MG tablet, Take 1  tablet (20 mg total) by mouth See admin instructions. Take 1 tablet (20 mg) daily as needed if you gain more than 3 pounds in 1 day or 5 pounds in 1 week., Disp: 90 tablet, Rfl: 1   HYDROcodone -acetaminophen  (NORCO/VICODIN) 5-325 MG tablet, Take 1 tablet by mouth every 6 (six) hours as needed for severe pain (pain score 7-10)., Disp: 10 tablet, Rfl: 0   levothyroxine  (SYNTHROID ) 125 MCG tablet, Take 125 mcg by mouth every morning., Disp: , Rfl:    losartan  (COZAAR ) 25 MG tablet, Take 1 tablet (25 mg total) by mouth daily., Disp: 90 tablet, Rfl: 3   metoprolol  succinate (TOPROL -XL) 50 MG 24 hr tablet, TAKE 1 TABLET BY MOUTH EVERY DAY, Disp: 90 tablet, Rfl: 0   Multiple Vitamin (MULTIVITAMIN) capsule, Take 1 capsule by mouth daily., Disp: , Rfl:    polyvinyl alcohol  (LIQUIFILM TEARS) 1.4 % ophthalmic solution, Place 1 drop into both eyes as needed for dry eyes., Disp: 15 mL, Rfl: 0   rosuvastatin  (CRESTOR ) 5 MG tablet, Take 1 tablet (5 mg total) by mouth daily., Disp: 90 tablet, Rfl: 3   spironolactone  (ALDACTONE ) 25 MG tablet, Take 0.5 tablets (12.5 mg total) by mouth daily., Disp: 90 tablet, Rfl: 3  amoxicillin -clavulanate (AUGMENTIN ) 875-125 MG tablet, Take 1 tablet by mouth 2 (two) times daily. (Patient not taking: Reported on 03/28/2024), Disp: 20 tablet, Rfl: 0   levothyroxine  (SYNTHROID ) 112 MCG tablet, Take 112 mcg by mouth every morning. 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   Tobacco Use   Smoking status: Former    Current packs/day: 0.00    Average packs/day: 2.0 packs/day for 40.0 years (80.0 ttl pk-yrs)    Types: Cigarettes    Start date: 06/01/1957     Quit date: 06/01/1997    Years since quitting: 26.8    Passive exposure: Past   Smokeless tobacco: Never  Vaping Use   Vaping status: Never Used  Substance and Sexual Activity   Alcohol  use: No   Drug use: No   Sexual activity: Not Currently    Comment: vesectomy  Other Topics Concern   Not on file  Social History Narrative   One level home with wife    81/853 - 36 year old MIL lives with them   Caffiene 2 cups daily, tea occas.   Retired, visual merchandiser   Social Drivers of Corporate Investment Banker Strain: Low Risk  (03/27/2024)   Overall Financial Resource Strain (CARDIA)    Difficulty of Paying Living Expenses: Not hard at all  Food Insecurity: No Food Insecurity (03/27/2024)   Hunger Vital Sign    Worried About Running Out of Food in the Last Year: Never true    Ran Out of Food in the Last Year: Never true  Transportation Needs: No Transportation Needs (03/27/2024)   PRAPARE - Administrator, Civil Service (Medical): No    Lack of Transportation (Non-Medical): No  Physical Activity: Insufficiently Active (03/27/2024)   Exercise Vital Sign    Days of Exercise per Week: 1 day    Minutes of Exercise per Session: 10 min  Stress: No Stress Concern Present (03/27/2024)   [employer] of Occupational Health - Occupational Stress Questionnaire    Feeling of Stress: Only a little  Social Connections: Unknown (03/27/2024)   Social Connection and Isolation Panel    Frequency of Communication with Friends and Family: Twice a week    Frequency of Social Gatherings with Friends and Family: Twice a week    Attends Religious Services: Not on Marketing Executive or Organizations: Yes    Attends Banker Meetings: 1 to 4 times per year    Marital Status: Married  Catering Manager Violence: Not At Risk (12/28/2023)   Humiliation, Afraid, Rape, and Kick questionnaire    Fear of Current or Ex-Partner: No    Emotionally Abused: No    Physically  Abused: No    Sexually Abused: No   Family History  Problem Relation Age of Onset   Alzheimer's disease Mother    Heart attack Father    Heart disease Father    Arthritis Father    Hearing loss Father    Hypertension Father    Irregular heart beat Brother        DEFIB.   Congestive Heart Failure Brother    Heart disease Brother  Diabetes Daughter    Sleep apnea Neg Hx     Objective: Office vital signs reviewed. BP 109/62   Pulse 70   Temp 98 F (36.7 C)   Ht 5' 6 (1.676 m)   Wt 222 lb 6 oz (100.9 kg)   SpO2 97%   BMI 35.89 kg/m   Physical Examination:  General: Awake, alert, well nourished, No acute distress HEENT: No scleral injection.  He does occasionally wipe the left eye.  No facial swelling.  He has tenderness to palpation along the right cheek and there is a palpable tiny stone appreciated along the right parotid gland  Assessment/ Plan: 78 y.o. male   Stone of salivary gland or duct - Plan: amoxicillin -clavulanate (AUGMENTIN ) 875-125 MG tablet  Watering of left eye   Home care instructions for what I suspect is a salivary gland stone.  I gave him a pocket prescription for amoxicillin  we discussed indications for use but for now push oral fluids, sour foods, warm compresses and gentle massage  I advised him to follow-up with eye doctor about the watering left eye as we cannot rule out that this is a ductal issue versus excessive dry eye requiring something like Restasis   Norene CHRISTELLA Fielding, DO [facility] [phone]

## 2024-04-05 ENCOUNTER — Other Ambulatory Visit

## 2024-04-10 ENCOUNTER — Encounter: Payer: Self-pay | Admitting: Family

## 2024-04-10 ENCOUNTER — Ambulatory Visit: Admitting: Family

## 2024-04-10 VITALS — BP 128/69 | HR 79 | Temp 97.7°F | Ht 66.0 in | Wt 223.0 lb

## 2024-04-10 DIAGNOSIS — M109 Gout, unspecified: Secondary | ICD-10-CM | POA: Diagnosis not present

## 2024-04-10 MED ORDER — PREDNISONE 20 MG PO TABS: 40.0000 mg | ORAL_TABLET | Freq: Every day | ORAL | 0 refills | Status: AC

## 2024-04-10 MED ORDER — HYDROCODONE-ACETAMINOPHEN 5-325 MG PO TABS: 1.0000 | ORAL_TABLET | Freq: Four times a day (QID) | ORAL | 0 refills | Status: AC | PRN

## 2024-04-10 NOTE — Progress Notes (Signed)
 Subjective:    Patient ID: Billy Rasmussen, male    DOB: [birth year], 78 y.o.   MRN: [id number]  Chief Complaint  Patient presents with   Gout    Both feet started 4 days ago     HPI Pt presents to the office today with pain in left great toe pain and right fifth toe going to the ankle. Reports the area is erythemas, slightly swollen and warmth.   Has a hx of gout and feels similar.   He only has one kidney.    Review of Systems  Musculoskeletal:  Positive for gait problem and myalgias.  All other systems reviewed and are negative.   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   Tobacco Use   Smoking status: Former    Current packs/day: 0.00    Average packs/day: 2.0 packs/day for 40.0 years (80.0 ttl pk-yrs)    Types: Cigarettes    Start date: 06/01/1957    Quit date: 06/01/1997    Years since quitting: 26.8    Passive exposure: Past   Smokeless tobacco: Never  Vaping Use   Vaping status: Never Used  Substance and Sexual Activity   Alcohol  use: No   Drug use: No   Sexual activity: Not Currently    Comment: vesectomy  Other Topics Concern   Not on file  Social History Narrative   One level home with wife    72/4667 - 63 year old MIL lives with them   Caffiene 2 cups daily, tea occas.   Retired, visual merchandiser   Social Drivers of Corporate Investment Banker Strain: Low Risk  (03/27/2024)   Overall Financial Resource Strain (CARDIA)    Difficulty of Paying Living Expenses: Not hard at all  Food Insecurity: No Food Insecurity (03/27/2024)   Hunger Vital Sign    Worried About Running Out of Food in the Last Year: Never true    Ran Out of Food in the Last Year: Never true  Transportation  Needs: No Transportation Needs (03/27/2024)   PRAPARE - Administrator, Civil Service (Medical): No    Lack of Transportation (Non-Medical): No  Physical Activity: Insufficiently Active (03/27/2024)   Exercise Vital Sign    Days of Exercise per Week: 1 day    Minutes of Exercise per Session: 10 min  Stress: No Stress Concern Present (03/27/2024)   [employer] of Occupational Health - Occupational Stress Questionnaire    Feeling of Stress: Only a little  Social Connections: Unknown (03/27/2024)   Social Connection and Isolation Panel    Frequency of Communication with Friends and Family: Twice a week    Frequency of Social Gatherings with Friends and Family: Twice a week    Attends Religious Services: Not on Marketing Executive or Organizations: Yes    Attends Banker Meetings: 1 to 4 times per year    Marital Status: Married   Family History  Problem Relation Age of Onset   Alzheimer's disease Mother    Heart attack Father    Heart disease Father    Arthritis Father    Hearing loss Father    Hypertension Father  Irregular heart beat Brother        DEFIB.   Congestive Heart Failure Brother    Heart disease Brother    Diabetes Daughter    Sleep apnea Neg Hx         Objective:   Physical Exam Vitals reviewed.  Constitutional:      General: He is not in acute distress.    Appearance: He is well-developed.  HENT:     Head: Normocephalic.     Right Ear: Tympanic membrane normal.     Left Ear: Tympanic membrane normal.  Eyes:     General:        Right eye: No discharge.        Left eye: No discharge.     Pupils: Pupils are equal, round, and reactive to light.  Neck:     Thyroid : No thyromegaly.  Cardiovascular:     Rate and Rhythm: Normal rate and regular rhythm.     Heart sounds: Normal heart sounds. No murmur heard. Pulmonary:     Effort: Pulmonary effort is normal. No respiratory distress.     Breath sounds: Normal  breath sounds. No wheezing.  Abdominal:     General: Bowel sounds are normal. There is no distension.     Palpations: Abdomen is soft.     Tenderness: There is no abdominal tenderness.  Musculoskeletal:        General: No tenderness. Normal range of motion.     Cervical back: Normal range of motion and neck supple.  Skin:    General: Skin is warm and dry.     Findings: No erythema or rash.  Neurological:     Mental Status: He is alert and oriented to person, place, and time.     Cranial Nerves: No cranial nerve deficit.     Deep Tendon Reflexes: Reflexes are normal and symmetric.  Psychiatric:        Behavior: Behavior normal.        Thought Content: Thought content normal.        Judgment: Judgment normal.       BP 128/69   Pulse 79   Temp 97.7 F (36.5 C) (Temporal)   Ht 5' 6 (1.676 m)   Wt 223 lb (101.2 kg)   SpO2 95%   BMI 35.99 kg/m      Assessment & Plan:  [name] comes in today with chief complaint of Gout (Both feet started 4 days ago )   Diagnosis and orders addressed:  1. Acute gout involving toe, unspecified cause, unspecified laterality (Primary) Rest Force fluids Start Prednisone , will avoid NSAID's for one kidney Pt reviewed in [place] controlled database, no red flags.  Norco given  Follow up if symptoms worsen or do not improve  - predniSONE  (DELTASONE ) 20 MG tablet; Take 2 tablets (40 mg total) by mouth daily with breakfast for 5 days. 2 po daily for 5 days  Dispense: 10 tablet; Refill: 0 - HYDROcodone -acetaminophen  (NORCO/VICODIN) 5-325 MG tablet; Take 1 tablet by mouth every 6 (six) hours as needed for severe pain (pain score 7-10).  Dispense: 10 tablet; Refill: 0     Bari Learn, [name]

## 2024-04-10 NOTE — Patient Instructions (Signed)
Gout  Gout is a condition that causes painful swelling of the joints. Gout is a type of inflammation of the joints (arthritis). This condition is caused by having too much uric acid in the body. Uric acid is a chemical that forms when the body breaks down substances called purines. Purines are important for building body proteins. When the body has too much uric acid, sharp crystals can form and build up inside the joints. This causes pain and swelling. Gout attacks can happen quickly and may be very painful (acute gout). Over time, the attacks can affect more joints and become more frequent (chronic gout). Gout can also cause uric acid to build up under the skin and inside the kidneys. What are the causes? This condition is caused by too much uric acid in your blood. This can happen because: Your kidneys do not remove enough uric acid from your blood. This is the most common cause. Your body makes too much uric acid. This can happen with some cancers and cancer treatments. It can also occur if your body is breaking down too many red blood cells (hemolytic anemia). You eat too many foods that are high in purines. These foods include organ meats and some seafood. Alcohol, especially beer, is also high in purines. A gout attack may be triggered by trauma or stress. What increases the risk? The following factors may make you more likely to develop this condition: Having a family history of gout. Being male and middle-aged. Being male and having gone through menopause. Taking certain medicines, including aspirin, cyclosporine, diuretics, levodopa, and niacin. Having an organ transplant. Having certain conditions, such as: Being obese. Lead poisoning. Kidney disease. A skin condition called psoriasis. Other factors include: Losing weight too quickly. Being dehydrated. Frequently drinking alcohol, especially beer. Frequently drinking beverages that are sweetened with a type of sugar called  fructose. What are the signs or symptoms? An attack of acute gout happens quickly. It usually occurs in just one joint. The most common place is the big toe. Attacks often start at night. Other joints that may be affected include joints of the feet, ankle, knee, fingers, wrist, or elbow. Symptoms of this condition may include: Severe pain. Warmth. Swelling. Stiffness. Tenderness. The affected joint may be very painful to touch. Shiny, red, or purple skin. Chills and fever. Chronic gout may cause symptoms more frequently. More joints may be involved. You may also have white or yellow lumps (tophi) on your hands or feet or in other areas near your joints. How is this diagnosed? This condition is diagnosed based on your symptoms, your medical history, and a physical exam. You may have tests, such as: Blood tests to measure uric acid levels. Removal of joint fluid with a thin needle (aspiration) to look for uric acid crystals. X-rays to look for joint damage. How is this treated? Treatment for this condition has two phases: treating an acute attack and preventing future attacks. Acute gout treatment may include medicines to reduce pain and swelling, including: NSAIDs, such as ibuprofen. Steroids. These are strong anti-inflammatory medicines that can be taken by mouth (orally) or injected into a joint. Colchicine. This medicine relieves pain and swelling when it is taken soon after an attack. It can be given by mouth or through an IV. Preventive treatment may include: Daily use of smaller doses of NSAIDs or colchicine. Use of a medicine that reduces uric acid levels in your blood, such as allopurinol. Changes to your diet. You may need to see   a dietitian about what to eat and drink to prevent gout. Follow these instructions at home: During a gout attack  If directed, put ice on the affected area. To do this: Put ice in a plastic bag. Place a towel between your skin and the bag. Leave the  ice on for 20 minutes, 2-3 times a day. Remove the ice if your skin turns bright red. This is very important. If you cannot feel pain, heat, or cold, you have a greater risk of damage to the area. Raise (elevate) the affected joint above the level of your heart as often as possible. Rest the joint as much as possible. If the affected joint is in your leg, you may be given crutches to use. Follow instructions from your health care provider about eating or drinking restrictions. Avoiding future gout attacks Follow a low-purine diet as told by your dietitian or health care provider. Avoid foods and drinks that are high in purines, including liver, kidney, anchovies, asparagus, herring, mushrooms, mussels, and beer. Maintain a healthy weight or lose weight if you are overweight. If you want to lose weight, talk with your health care provider. Do not lose weight too quickly. Start or maintain an exercise program as told by your health care provider. Eating and drinking Avoid drinking beverages that contain fructose. Drink enough fluids to keep your urine pale yellow. If you drink alcohol: Limit how much you have to: 0-1 drink a day for women who are not pregnant. 0-2 drinks a day for men. Know how much alcohol is in a drink. In the [place] one drink equals one 12 oz bottle of beer (355 mL), one 5 oz glass of wine (148 mL), or one 1 oz glass of hard liquor (44 mL). General instructions Take over-the-counter and prescription medicines only as told by your health care provider. Ask your health care provider if the medicine prescribed to you requires you to avoid driving or using machinery. Return to your normal activities as told by your health care provider. Ask your health care provider what activities are safe for you. Keep all follow-up visits. This is important. Where to find more information [employer]: www.niams.nih.gov Contact a health care provider if you have: Another  gout attack. Continuing symptoms of a gout attack after 10 days of treatment. Side effects from your medicines. Chills or a fever. Burning pain when you urinate. Pain in your lower back or abdomen. Get help right away if you: Have severe or uncontrolled pain. Cannot urinate. Summary Gout is painful swelling of the joints caused by having too much uric acid in the body. The most common site for gout to occur is in the big toe, but it can affect other joints in the body. Medicines and dietary changes can help to prevent and treat gout attacks. This information is not intended to replace advice given to you by your health care provider. Make sure you discuss any questions you have with your health care provider. Document Revised: 02/19/2021 Document Reviewed: 02/19/2021 [employer] Patient Education  2024 [employer]  

## 2024-04-18 ENCOUNTER — Other Ambulatory Visit: Payer: Self-pay

## 2024-04-18 ENCOUNTER — Other Ambulatory Visit

## 2024-04-18 ENCOUNTER — Encounter: Payer: Self-pay | Admitting: Cardiology

## 2024-04-18 LAB — LIPID PANEL
Chol/HDL Ratio: 3.7 ratio (ref 0.0–5.0)
Cholesterol, Total: 121 mg/dL (ref 100–199)
HDL: 33 mg/dL — ABNORMAL LOW (ref >39)
LDL Chol Calc (NIH): 60 mg/dL (ref 0–99)
Triglycerides: 161 mg/dL — ABNORMAL HIGH (ref 0–149)
VLDL Cholesterol Cal: 28 mg/dL (ref 5–40)

## 2024-04-24 ENCOUNTER — Encounter: Admitting: Family Medicine

## 2024-04-24 NOTE — Progress Notes (Unsigned)
 Cardiology Office Note:    Date:  04/25/2024   ID:  Deni, Lefever 05/14/1946, MRN [id number]  PCP:  [name], DO  Cardiologist:  Lonni LITTIE Nanas, MD  Electrophysiologist:  Eulas FORBES Furbish, MD   Referring MD: [name], DO   Chief Complaint  Patient presents with   Congestive Heart Failure    History of Present Illness:    Billy Rasmussen is a 78 y.o. male with a hx of chronic combined systolic and diastolic heart failure, bladder cancer s/p left nephrectomy, cystoprostatectomy , fibromyalgia, hypertension, OSA not on CPAP, psoriasis who presents for follow-up.  He was initially seen as an ED follow-up on 10/06/2022.  He was seen in the ED on 09/27/2022 for shortness of breath.  Chest x-ray shows interstitial edema and mildly elevated BNP.  CTPA showed no PE but findings suggestive of pulmonary edema, as well as 4.5 cm ascending aortic aneurysm.  He was given Lasix  with improvement and discharged on p.o. Lasix  20 mg daily with referral to cardiology.  Echocardiogram 04/2019 showed EF 55-60%, ascending aortic aneurysm measuring 45 mm, normal RV function, no significant valvular disease.  At initial clinic visit on 10/06/2022, he reported fever and was hypotensive.  He was sent to the ED.  He was admitted from 5/7 through 10/13/2022.  He was treated for UTI.  Echo 10/17/2022 showed EF less than 20%, mildly reduced RV systolic dysfunction, RVSP 44, severe left atrial enlargement, mild MR, dilated ascending aorta measuring 45 mm.  RHC/LHC on 10/09/2022 showed nonobstructive CAD (30% RCA stenosis, RA 6 RV 50/3, PA 55/26/37 PCWP 27 CI 3.85.  Had AKI during admission, diuresis was held and he was discharged on as needed Lasix .  Zio patch x 10 days 10/2022 showed 1 episode of NSVT lasting 8 beats, 10 episodes of SVT with longest lasting 19 beats.  Echocardiogram 12/2022 showed EF less than 20%, grade 2 diastolic dysfunction, elevated left atrial pressure,  mild RV dysfunction, mild to moderate MR, dilated ascending aorta measuring 44 mm.  He was referred to EP and underwent BiV ICD insertion with Dr. Furbish on 03/01/2023.  Cardiac MRI 05/08/2023 showed no LGE, unable to quantify LV/RV volumes and EF due to heart defect from patient's device, dilated ascending aorta measuring 44 mm.  Echocardiogram 05/14/2023 showed EF 25 to 30%, grade 1 diastolic dysfunction, mild RV dysfunction.  Echocardiogram 01/2024 showed EF 40 to 45%.  He was admitted 05/2023 with chest pain.  Suspected pericarditis, he was started on aspirin  taper and colchicine .  He was admitted with urosepsis 11/2023.  Since last clinic visit, he reports he is doing okay.  States that he slipped and had fall in kitchen last week.  Did not hit his head but injured his knee.  No loss of consciousness, reports was mechanical fall.  Does report occasional lightheadedness but denies any syncope. Denies any chest pain, dyspnea, lower extremity edema, or palpitations.  Has been taking Lasix  about every other day.   BP Readings from Last 3 Encounters:  04/25/24 (!) 96/58  04/10/24 128/69  03/28/24 109/62   Wt Readings from Last 3 Encounters:  04/25/24 219 lb 6.4 oz (99.5 kg)  04/10/24 223 lb (101.2 kg)  03/28/24 222 lb 6 oz (100.9 kg)     Past Medical History:  Diagnosis Date   AICD (automatic cardioverter/defibrillator) present    [employer]   Anemia aprox. may-1-24   Arthritis    knees, shoulders, elbows   Bladder cancer (  [facility])    urologist-  dr nieves   CHF (congestive heart failure) (HCC)    Chronic kidney disease    left kidney removed   Diverticulosis of colon    Fibromyalgia    GERD (gastroesophageal reflux disease)    History of diverticulitis of colon    History of gastric ulcer    due to aleve   Hypertension    Lower urinary tract symptoms (LUTS)    Myocardial infarction (HCC) may 1-24   OSA (obstructive sleep apnea)    Wears CPAP nightly   Oxygen  deficiency     Pericarditis 05/17/2023   PONV (postoperative nausea and vomiting)    Psoriasis    Sepsis (HCC)    january 2021   Sleep apnea    Thyroid  disease    biopsy on 10-08-22   Tinnitus    right ear more, has tranmitter in right ear removable at hs   Ulcer    Wears glasses    Wears partial dentures     Past Surgical History:  Procedure Laterality Date   APPENDECTOMY     BIV ICD INSERTION CRT-D N/A 03/01/2023   Procedure: BIV ICD INSERTION CRT-D;  Surgeon: Nancey Eulas BRAVO, MD;  [facility]: [facility];  Service: Cardiovascular;  Laterality: N/A;   COLECTOMY W/ COLOSTOMY  1996   W/   APPENDECTOMY   COLONOSCOPY N/A 05/11/2018   Procedure: COLONOSCOPY;  Surgeon: Shaaron Lamar HERO, MD;  [facility]: [facility];  Service: Endoscopy;  Laterality: N/A;  9:30   COLOSTOMY TAKEDOWN  1996   CYSTOSCOPY WITH BIOPSY N/A 12/05/2013   Procedure: CYSTO BLADDER BIOPSY AND FULGERATION;  Surgeon: Donnice Nieves, MD;  Location: [facility];  Service: Urology;  Laterality: N/A;   CYSTOSCOPY WITH BIOPSY Bilateral 11/13/2014   Procedure: CYSTOSCOPY WITH  BLADDER BIOPSY FULGERATION AND BILATERAL RETROGRADE PYELOGRAMS;  Surgeon: Donnice Nieves, MD;  Location: [facility];  Service: Urology;  Laterality: Bilateral;   CYSTOSCOPY WITH FULGERATION N/A 01/18/2018   Procedure: CYSTOSCOPY WITH FULGERATION/ BLADDER BIOPSY;  Surgeon: Nieves Donnice, MD;  Location: [facility];  Service: Urology;  Laterality: N/A;   CYSTOSCOPY WITH INJECTION N/A 11/09/2018   Procedure: CYSTOSCOPY WITH INJECTION OF INDOCYANINE GREEN  DYE;  Surgeon: Alvaro Hummer, MD;  Location: [facility];  Service: Urology;  Laterality: N/A;   CYSTOSCOPY WITH INSERTION OF UROLIFT N/A 01/18/2018   Procedure: CYSTOSCOPY WITH INSERTION OF UROLIFT;  Surgeon: Nieves Donnice, MD;  Location: [facility];  Service: Urology;  Laterality: N/A;   CYSTOSCOPY/URETEROSCOPY/HOLMIUM LASER Left 02/17/2019    Procedure: URETEROSCOPY WITH BALLOON DILATION  AND NEPHROSTOGRAM;  Surgeon: Alvaro Hummer, MD;  Location: [facility];  Service: Urology;  Laterality: Left;  1 HR   EXCISION RIGHT UPPER ARM LIPOMA  2005   FRACTURE SURGERY     HEMORROIDECTOMY     INGUINAL HERNIA REPAIR Left 1984   IR NEPHROSTOMY PLACEMENT LEFT  01/05/2019   JOINT REPLACEMENT     both  knees, and both shoulders joints replaced.   KNEE ARTHROSCOPY Left X3  LAST ONE  2002   ORIF LEFT HUMEROUS FX  1976   POLYPECTOMY  05/11/2018   Procedure: POLYPECTOMY;  Surgeon: Shaaron Lamar HERO, MD;  [facility];  Service: Endoscopy;;   PROSTATE SURGERY     RIGHT HEART CATH AND CORONARY ANGIOGRAPHY N/A 10/09/2022   Procedure: RIGHT HEART CATH AND CORONARY ANGIOGRAPHY;  Surgeon: Wendel Lurena POUR, MD;  Location: [facility] INVASIVE  CV LAB;  Service: Cardiovascular;  Laterality: N/A;   ROBOT ASSISTED LAPAROSCOPIC NEPHRECTOMY Left 07/14/2019   Procedure: XI ROBOTIC ASSISTED LAPAROSCOPIC RETROPERITONEAL NEPHRECTOMY;  Surgeon: Alvaro Hummer, MD;  Location: [facility];  Service: Urology;  Laterality: Left;  3 HRS   SMALL INTESTINE SURGERY     THYROIDECTOMY Bilateral 04/14/2023   Procedure: TOTAL THYROIDECTOMY;  Surgeon: Jesus Oliphant, MD;  Location: [facility] OR;  Service: ENT;  Laterality: Bilateral;   TONSILLECTOMY  AS CHILD   TOTAL KNEE ARTHROPLASTY Left 2006   REVISION 2007  (AFTER I & D WITH ANTIBIOTIC SPACER PROCEDURE FOR STEPH INFECTION)   TOTAL KNEE ARTHROPLASTY Right 03/30/2016   Procedure: RIGHT TOTAL KNEE ARTHROPLASTY;  Surgeon: Donnice Car, MD;  Location: [facility];  Service: Orthopedics;  Laterality: Right;   TOTAL SHOULDER ARTHROPLASTY Right 04/06/2013   Procedure: RIGHT TOTAL SHOULDER ARTHROPLASTY;  Surgeon: Franky CHRISTELLA Pointer, MD;  Location: [facility];  Service: Orthopedics;  Laterality: Right;   TOTAL SHOULDER ARTHROPLASTY Left 07/20/2013   Procedure: LEFT TOTAL SHOULDER ARTHROPLASTY;  Surgeon: Franky CHRISTELLA Pointer, MD;  Location: [facility];  Service: Orthopedics;  Laterality: Left;   TRANSURETHRAL RESECTION OF BLADDER TUMOR N/A 11/12/2015   Procedure: TRANSURETHRAL RESECTION OF BLADDER TUMOR (TURBT);  Surgeon: Donnice Brooks, MD;  Location: [facility];  Service: Urology;  Laterality: N/A;   TRANSURETHRAL RESECTION OF BLADDER TUMOR WITH GYRUS (TURBT-GYRUS) N/A 12/05/2013   Procedure: TRANSURETHRAL RESECTION OF BLADDER TUMOR WITH GYRUS (TURBT-GYRUS);  Surgeon: Donnice Brooks, MD;  Location: [facility];  Service: Urology;  Laterality: N/A;    Current Medications: Current Meds  Medication Sig   Albuterol -Budesonide  90-80 MCG/ACT AERO Take 2 puffs by mouth 4 (four) times daily as needed (shortness of breath.).   furosemide  (LASIX ) 20 MG tablet Take 1 tablet (20 mg total) by mouth See admin instructions. Take 1 tablet (20 mg) daily as needed if you gain more than 3 pounds in 1 day or 5 pounds in 1 week.   HYDROcodone -acetaminophen  (NORCO/VICODIN) 5-325 MG tablet Take 1 tablet by mouth every 6 (six) hours as needed for severe pain (pain score 7-10).   levothyroxine  (SYNTHROID ) 125 MCG tablet Take 125 mcg by mouth every morning.   losartan  (COZAAR ) 25 MG tablet Take 1 tablet (25 mg total) by mouth daily.   metoprolol  succinate (TOPROL -XL) 50 MG 24 hr tablet TAKE 1 TABLET BY MOUTH EVERY DAY   Multiple Vitamin (MULTIVITAMIN) capsule Take 1 capsule by mouth daily.   polyvinyl alcohol  (LIQUIFILM TEARS) 1.4 % ophthalmic solution Place 1 drop into both eyes as needed for dry eyes.   spironolactone  (ALDACTONE ) 25 MG tablet Take 0.5 tablets (12.5 mg total) by mouth daily.   [DISCONTINUED] rosuvastatin  (CRESTOR ) 5 MG tablet Take 1 tablet (5 mg total) by mouth daily.     Allergies:   Ciprofloxacin    Social History   Socioeconomic History   Marital status: Married    Spouse name: Peggy   Number of children: 2   Years of education: 14   Highest education level: Associate degree: occupational,  scientist, product/process development, or vocational program  Occupational History   Occupation: retired    Comment: [employer], 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   Tobacco Use   Smoking status: Former    Current packs/day: 0.00    Average packs/day: 2.0 packs/day for 40.0 years (80.0 ttl pk-yrs)    Types: Cigarettes    Start date: 06/01/1957    Quit date: 06/01/1997    Years since quitting: 26.9    Passive  exposure: Past   Smokeless tobacco: Never  Vaping Use   Vaping status: Never Used  Substance and Sexual Activity   Alcohol  use: No   Drug use: No   Sexual activity: Not Currently    Comment: vesectomy  Other Topics Concern   Not on file  Social History Narrative   One level home with wife    07/6948 - 22 year old MIL lives with them   Caffiene 2 cups daily, tea occas.   Retired, visual merchandiser   Social Drivers of Corporate Investment Banker Strain: Low Risk  (03/27/2024)   Overall Financial Resource Strain (CARDIA)    Difficulty of Paying Living Expenses: Not hard at all  Food Insecurity: No Food Insecurity (04/21/2024)   [facility]   Hunger Vital Sign    Within the past 12 months, you worried that your food would run out before you got the money to buy more.: Never true    Within the past 12 months, the food you bought just didn't last and you didn't have money to get more.: Never true  Transportation Needs: No Transportation Needs (04/21/2024)   Received from [facility] - Transportation    Lack of Transportation (Medical): No    Lack of Transportation (Non-Medical): No  Physical Activity: Insufficiently Active (03/27/2024)   Exercise Vital Sign    Days of Exercise per Week: 1 day    Minutes of Exercise per Session: 10 min  Stress: No Stress Concern Present (03/27/2024)   [employer] of Occupational Health - Occupational Stress Questionnaire    Feeling of Stress: Only a little  Social Connections: Unknown (03/27/2024)   Social Connection and Isolation Panel    Frequency of  Communication with Friends and Family: Twice a week    Frequency of Social Gatherings with Friends and Family: Twice a week    Attends Religious Services: Not on Marketing Executive or Organizations: Yes    Attends Banker Meetings: 1 to 4 times per year    Marital Status: Married     Family History: The patient's family history includes Alzheimer's disease in his mother; Arthritis in his father; Congestive Heart Failure in his brother; Diabetes in his daughter; Hearing loss in his father; Heart attack in his father; Heart disease in his brother and father; Hypertension in his father; Irregular heart beat in his brother. There is no history of Sleep apnea.  ROS:   Please see the history of present illness.     All other systems reviewed and are negative.  EKGs/Labs/Other Studies Reviewed:    The following studies were reviewed today:   EKG:   03/18/23: BiV paced, rate 111 10/06/22: Sinus rhythm with first-degree AV block, rate 88, nonspecific intraventricular conduction delay, right axis deviation  Recent Labs: 09/13/2023: TSH 4.190 12/24/2023: B Natriuretic Peptide 187.0 01/04/2024: Hemoglobin 12.8; Platelets 224 01/26/2024: ALT 24; BUN 22; Magnesium  2.2; Potassium 4.9; Sodium 138 02/18/2024: Creatinine, Ser 1.50  Recent Lipid Panel    Component Value Date/Time   CHOL 121 04/18/2024 0923   TRIG 161 (H) 04/18/2024 0923   HDL 33 (L) 04/18/2024 0923   CHOLHDL 3.7 04/18/2024 0923   LDLCALC 60 04/18/2024 0923   LDLDIRECT 96 02/25/2016 1352    Physical Exam:    VS:  BP (!) 96/58   Pulse 77   Ht 5' 6 (1.676 m)   Wt 219 lb 6.4 oz (99.5 kg)  SpO2 96%   BMI 35.41 kg/m     Wt Readings from Last 3 Encounters:  04/25/24 219 lb 6.4 oz (99.5 kg)  04/10/24 223 lb (101.2 kg)  03/28/24 222 lb 6 oz (100.9 kg)     GEN:  Well nourished, well developed in no acute distress HEENT: Normal NECK: No JVD; No carotid bruits CARDIAC: RRR, no murmurs, rubs,  gallops RESPIRATORY:  Clear to auscultation without rales, wheezing or rhonchi  ABDOMEN: Soft, non-tender, non-distended MUSCULOSKELETAL:  No edema; No deformity  SKIN: Warm and dry NEUROLOGIC:  Alert and oriented x 3 PSYCHIATRIC:  Normal affect   ASSESSMENT:    1. Chronic combined systolic and diastolic CHF (congestive heart failure) (HCC)   2. Biventricular cardiac pacemaker in situ   3. Essential hypertension   4. CKD stage 3a, GFR 45-59 ml/min (HCC)   5. Coronary artery disease involving native coronary artery of native heart without angina pectoris   6. Aneurysm of ascending aorta without rupture   7. Pure hypercholesterolemia      PLAN:    Chronic combined heart failure: Echo 10/17/2022 showed EF less than 20%, mildly reduced RV systolic dysfunction, RVSP 44, severe left atrial enlargement, mild MR, dilated ascending aorta measuring 45 mm.  RHC/LHC on 10/09/2022 showed nonobstructive CAD (30% RCA stenosis, RA 6 RV 50/3, PA 55/26/37 PCWP 27 CI 3.85.  Echocardiogram 12/2022 showed EF remains less than 20%.  He was referred to EP and underwent BiV ICD insertion with Dr. Nancey on 03/01/2023.  Cardiac MRI 05/08/2023 showed no LGE, unable to quantify LV/RV volumes and EF due to artifact from patient's device, dilated ascending aorta measuring 44 mm.   Echocardiogram 05/14/2023 showed EF 25 to 30%, grade 1 diastolic dysfunction, mild RV dysfunction.  Echocardiogram 01/2024 showed EF 40 to 45%. -Continue Lasix  20 mg daily as needed.  Would monitor daily weights and take Lasix  if gains more than 3 pounds in 1 day or 5 pounds in 1 week -Continue Toprol -XL 25 mg daily -Continue losartan  25 mg daily.  Eliquis was too expensive -Continue spironolactone  12.5 mg daily -Hold off on SGLT2 inhibitor given frequent UTIs -Follows with EP for BiV ICD management, >99% BiV paced on recent interrogation -Stable BMET 04/05/2024  Pericarditis: He was admitted 05/2023 with chest pain.  Suspected pericarditis, he  was started on aspirin  taper and colchicine .  He had to stop colchicine  due to diarrhea.  Reports symptoms resolved, no recent chest pain  CAD: LHC on 10/09/2022 showed nonobstructive CAD with 30% RCA stenosis. -LDL 94 on 07/07/2022, goal less than 70.  Started atorvastatin  20 mg daily.  LDL 33 on 03/18/2023.  He is reporting muscle cramps, decreased atorvastatin  to 10 mg daily then discontinued.  LDL 84 on 11/2023.  Started rosuvastatin  5 mg daily, LDL 60 on 04/18/2024  Palpitations: Zio patch x 10 days 10/2022 showed 1 episode of NSVT lasting 8 beats, 10 episodes of SVT with longest lasting 19 beats. -Continue Toprol -XL 25 mg daily  Aortic aneurysm: Ascending aortic aneurysm measuring 45 mm on CTPA 08/2022.  Measured 45 mm on echocardiogram 05/2023, will monitor  CKD 3A: Has history of left nephrectomy due to bladder cancer.  Baseline creatinine appears 1.3-1.5.  Creatinine was up to 1.8 during admission 09/2022.  Most recent creatinine 1.4 on 04/06/23.  Follows with nephrology.    Hypertension: On Toprol -XL, losartan , and spironolactone  as above  OSA: Reports compliance with CPAP  Thyroid  cancer: Status post thyroidectomy 04/2023  RTC in 6 months  Medication  Adjustments/Labs and Tests Ordered: Current medicines are reviewed at length with the patient today.  Concerns regarding medicines are outlined above.  No orders of the defined types were placed in this encounter.  Meds ordered this encounter  Medications   rosuvastatin  (CRESTOR ) 5 MG tablet    Sig: Take 1 tablet (5 mg total) by mouth daily.    Dispense:  90 tablet    Refill:  3    Patient Instructions  Medication Instructions:  Your physician recommends that you continue on your current medications as directed. Please refer to the Current Medication list given to you today.  *If you need a refill on your cardiac medications before your next appointment, please call your pharmacy*  Lab Work: None  ordered  Testing/Procedures: None ordered  Follow-Up: At [facility], you and your health needs are our priority.  As part of our continuing mission to provide you with exceptional heart care, our providers are all part of one team.  This team includes your primary Cardiologist (physician) and Advanced Practice Providers or APPs (Physician Assistants and Nurse Practitioners) who all work together to provide you with the care you need, when you need it.  Your next appointment:   6 month(s)  Provider:   Lonni LITTIE Nanas, MD     Thank you for choosing [facility]!!   [phone]         Signed, Lonni LITTIE Nanas, MD  04/25/2024 12:52 PM    [facility]

## 2024-04-25 ENCOUNTER — Ambulatory Visit: Attending: Cardiology | Admitting: Cardiology

## 2024-04-25 ENCOUNTER — Encounter: Payer: Self-pay | Admitting: Cardiology

## 2024-04-25 VITALS — BP 96/58 | HR 77 | Ht 66.0 in | Wt 219.4 lb

## 2024-04-25 DIAGNOSIS — N1831 Chronic kidney disease, stage 3a: Secondary | ICD-10-CM

## 2024-04-25 DIAGNOSIS — I5042 Chronic combined systolic (congestive) and diastolic (congestive) heart failure: Secondary | ICD-10-CM | POA: Diagnosis not present

## 2024-04-25 DIAGNOSIS — Z950 Presence of cardiac pacemaker: Secondary | ICD-10-CM

## 2024-04-25 DIAGNOSIS — I10 Essential (primary) hypertension: Secondary | ICD-10-CM

## 2024-04-25 DIAGNOSIS — I7121 Aneurysm of the ascending aorta, without rupture: Secondary | ICD-10-CM

## 2024-04-25 DIAGNOSIS — E7800 Pure hypercholesterolemia, unspecified: Secondary | ICD-10-CM

## 2024-04-25 DIAGNOSIS — I2510 Atherosclerotic heart disease of native coronary artery without angina pectoris: Secondary | ICD-10-CM

## 2024-04-25 MED ORDER — ROSUVASTATIN CALCIUM 5 MG PO TABS: 5.0000 mg | ORAL_TABLET | Freq: Every day | ORAL | 3 refills | Status: AC

## 2024-04-25 NOTE — Patient Instructions (Signed)
 Medication Instructions:  Your physician recommends that you continue on your current medications as directed. Please refer to the Current Medication list given to you today.  *If you need a refill on your cardiac medications before your next appointment, please call your pharmacy*  Lab Work: None ordered  Testing/Procedures: None ordered  Follow-Up: At [facility], you and your health needs are our priority.  As part of our continuing mission to provide you with exceptional heart care, our providers are all part of one team.  This team includes your primary Cardiologist (physician) and Advanced Practice Providers or APPs (Physician Assistants and Nurse Practitioners) who all work together to provide you with the care you need, when you need it.  Your next appointment:   6 month(s)  Provider:   Lonni LITTIE Nanas, MD     Thank you for choosing [facility]!!   [phone]

## 2024-05-14 ENCOUNTER — Other Ambulatory Visit: Payer: Self-pay | Admitting: Family

## 2024-05-26 ENCOUNTER — Encounter: Payer: Self-pay | Admitting: Family Medicine

## 2024-05-26 ENCOUNTER — Ambulatory Visit (INDEPENDENT_AMBULATORY_CARE_PROVIDER_SITE_OTHER): Admitting: Family Medicine

## 2024-05-26 ENCOUNTER — Ambulatory Visit: Payer: Self-pay

## 2024-05-26 VITALS — BP 123/70 | HR 82 | Temp 98.3°F | Ht 66.0 in | Wt 223.5 lb

## 2024-05-26 DIAGNOSIS — M10371 Gout due to renal impairment, right ankle and foot: Secondary | ICD-10-CM

## 2024-05-26 LAB — BASIC METABOLIC PANEL WITH GFR
BUN/Creatinine Ratio: 16 (ref 10–24)
BUN: 24 mg/dL (ref 8–27)
CO2: 23 mmol/L (ref 20–29)
Calcium: 10 mg/dL (ref 8.6–10.2)
Chloride: 100 mmol/L (ref 96–106)
Creatinine, Ser: 1.48 mg/dL — ABNORMAL HIGH (ref 0.76–1.27)
Glucose: 120 mg/dL — ABNORMAL HIGH (ref 70–99)
Potassium: 4.6 mmol/L (ref 3.5–5.2)
Sodium: 138 mmol/L (ref 134–144)
eGFR: 49 mL/min/1.73 — ABNORMAL LOW

## 2024-05-26 LAB — URIC ACID: Uric Acid: 8.1 mg/dL (ref 3.8–8.4)

## 2024-05-26 MED ORDER — PREDNISONE 10 MG (21) PO TBPK: ORAL_TABLET | ORAL | 0 refills | Status: DC

## 2024-05-26 MED ORDER — ALLOPURINOL 100 MG PO TABS: 100.0000 mg | ORAL_TABLET | Freq: Every day | ORAL | 0 refills | Status: AC

## 2024-05-26 MED ORDER — METHYLPREDNISOLONE ACETATE 80 MG/ML IJ SUSP
40.0000 mg | Freq: Once | INTRAMUSCULAR | Status: AC
  Administered 2024-05-26: 40 mg via INTRAMUSCULAR

## 2024-05-26 NOTE — Progress Notes (Signed)
 "  Subjective: RR:hnlu PCP: [name], DO YEP:Ryjmozd Billy Rasmussen is a 78 y.o. male presenting to clinic today for:  Patient reports 3-day history of gout in right foot, it is pretty much in the arch area radiating into the ankle.  He reports he took half a tablet of his friends allopurinol  300 mg in efforts to alleviate symptoms but it did not resolve so he took the other half.  Last gout flare was in November and treated with prednisone , which typically works.  Medical history is significant for CKD 3A.  Not currently on a preventative.  He admits to having eaten a bunch of foods for the Christmas holiday that may have provoked recent flareup   ROS: Per HPI  Allergies[1] Past Medical History:  Diagnosis Date   AICD (automatic cardioverter/defibrillator) present    Abbott/St Jude   Anemia aprox. may-1-24   Arthritis    knees, shoulders, elbows   Bladder cancer [facility])    urologist-  dr nieves   CHF (congestive heart failure) (HCC)    Chronic kidney disease    left kidney removed   Diverticulosis of colon    Fibromyalgia    GERD (gastroesophageal reflux disease)    History of diverticulitis of colon    History of gastric ulcer    due to aleve   Hypertension    Lower urinary tract symptoms (LUTS)    Myocardial infarction (HCC) may 1-24   OSA (obstructive sleep apnea)    Wears CPAP nightly   Oxygen  deficiency    Pericarditis 05/17/2023   PONV (postoperative nausea and vomiting)    Psoriasis    Sepsis (HCC)    january 2021   Sleep apnea    Thyroid  disease    biopsy on 10-08-22   Tinnitus    right ear more, has tranmitter in right ear removable at hs   Ulcer    Wears glasses    Wears partial dentures    Current Medications[2] Social History   Socioeconomic History   Marital status: Married    Spouse name: Clinical Cytogeneticist   Number of children: 2   Years of education: 14   Highest education level: Tax Adviser degree: occupational, scientist, product/process development, or vocational program   Occupational History   Occupation: retired    Comment: pharmacologist, 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   Tobacco Use   Smoking status: Former    Current packs/day: 0.00    Average packs/day: 2.0 packs/day for 40.0 years (80.0 ttl pk-yrs)    Types: Cigarettes    Start date: 06/01/1957    Quit date: 06/01/1997    Years since quitting: 27.0    Passive exposure: Past   Smokeless tobacco: Never  Vaping Use   Vaping status: Never Used  Substance and Sexual Activity   Alcohol  use: No   Drug use: No   Sexual activity: Not Currently    Comment: vesectomy  Other Topics Concern   Not on file  Social History Narrative   One level home with wife    [id number] - 76 year old MIL lives with them   Caffiene 2 cups daily, tea occas.   Retired, visual merchandiser   Social Drivers of Health   Tobacco Use: Medium Risk (05/26/2024)   Patient History    Smoking Tobacco Use: Former    Smokeless Tobacco Use: Never    Passive Exposure: Past  Physicist, Medical Strain: Low Risk (03/27/2024)   Overall Financial Resource Strain (CARDIA)    Difficulty of Paying Living Expenses: Not hard at  all  Food Insecurity: No Food Insecurity (04/21/2024)   Received from [facility]   Epic    Within the past 12 months, you worried that your food would run out before you got the money to buy more.: Never true    Within the past 12 months, the food you bought just didn't last and you didn't have money to get more.: Never true  Transportation Needs: No Transportation Needs (04/21/2024)   Received from [facility] - Transportation    Lack of Transportation (Medical): No    Lack of Transportation (Non-Medical): No  Physical Activity: Insufficiently Active (03/27/2024)   Exercise Vital Sign    Days of Exercise per Week: 1 day    Minutes of Exercise per Session: 10 min  Stress: No Stress Concern Present (03/27/2024)   [employer] of Occupational Health - Occupational Stress Questionnaire    Feeling of Stress: Only a little   Social Connections: Unknown (03/27/2024)   Social Connection and Isolation Panel    Frequency of Communication with Friends and Family: Twice a week    Frequency of Social Gatherings with Friends and Family: Twice a week    Attends Religious Services: Not on Marketing Executive or Organizations: Yes    Attends Banker Meetings: 1 to 4 times per year    Marital Status: Married  Catering Manager Violence: Not At Risk (12/28/2023)   Epic    Fear of Current or Ex-Partner: No    Emotionally Abused: No    Physically Abused: No    Sexually Abused: No  Depression (PHQ2-9): Low Risk (02/18/2024)   Depression (PHQ2-9)    PHQ-2 Score: 0  Alcohol  Screen: Low Risk (08/16/2023)   Alcohol  Screen    Last Alcohol  Screening Score (AUDIT): 0  Housing: Low Risk (03/27/2024)   Epic    Unable to Pay for Housing in the Last Year: No    Number of Times Moved in the Last Year: 0    Homeless in the Last Year: No  Utilities: Low Risk (04/21/2024)   Received from [facility]   Utilities    Within the past 12 months, have you been unable to get utilities(heat, electricity) when it was really needed?: No  Health Literacy: Adequate Health Literacy (08/16/2023)   B1300 Health Literacy    Frequency of need for help with medical instructions: Never   Family History  Problem Relation Age of Onset   Alzheimer's disease Mother    Heart attack Father    Heart disease Father    Arthritis Father    Hearing loss Father    Hypertension Father    Irregular heart beat Brother        DEFIB.   Congestive Heart Failure Brother    Heart disease Brother    Diabetes Daughter    Sleep apnea Neg Hx     Objective: Office vital signs reviewed. BP 123/70   Pulse 82   Temp 98.3 F (36.8 C)   Ht 5' 6 (1.676 m)   Wt 223 lb 8 oz (101.4 kg)   SpO2 97%   BMI 36.07 kg/m   Physical Examination:  General: Awake, alert, well nourished, No acute distress MSK: No gross erythema or warmth to  the right arch but exquisitely tender to palpation.  Ambulating with a limp.  Assessment/ Plan: 78 y.o. male   Acute gout due to renal impairment involving right foot - Plan: Uric  Acid, Basic Metabolic Panel, allopurinol  (ZYLOPRIM ) 100 MG tablet, methylPREDNISolone  acetate (DEPO-MEDROL ) injection 40 mg, predniSONE  (STERAPRED UNI-PAK 21 TAB) 10 MG (21) TBPK tablet, DISCONTINUED: predniSONE  (STERAPRED UNI-PAK 21 TAB) 10 MG (21) TBPK tablet   Will treat as acute gout flare.  Check uric acid, BMP.  Start prednisone .  Depo-Medrol  injection administered and he can start pack tomorrow.  Advised to not start the allopurinol  until total resolution of current gout flare.  Reinforced home care instructions including diet modification.  Handout provided.  Follow-up as needed   Norene CHRISTELLA Fielding, DO [facility] [phone]     [1]  Allergies Allergen Reactions   Ciprofloxacin  Itching  [2]  Current Outpatient Medications:    Albuterol -Budesonide  90-80 MCG/ACT AERO, Take 2 puffs by mouth 4 (four) times daily as needed (shortness of breath.)., Disp: , Rfl:    furosemide  (LASIX ) 20 MG tablet, TAKE 1 TABLET DAILY AS NEEDED IF YOU GAIN MORE THAN 3 POUNDS IN 1 DAY OR 5 POUNDS IN 1 WEEK., Disp: 90 tablet, Rfl: 1   HYDROcodone -acetaminophen  (NORCO/VICODIN) 5-325 MG tablet, Take 1 tablet by mouth every 6 (six) hours as needed for severe pain (pain score 7-10)., Disp: 10 tablet, Rfl: 0   levothyroxine  (SYNTHROID ) 125 MCG tablet, Take 125 mcg by mouth every morning., Disp: , Rfl:    losartan  (COZAAR ) 25 MG tablet, Take 1 tablet (25 mg total) by mouth daily., Disp: 90 tablet, Rfl: 3   metoprolol  succinate (TOPROL -XL) 50 MG 24 hr tablet, TAKE 1 TABLET BY MOUTH EVERY DAY, Disp: 90 tablet, Rfl: 0   Multiple Vitamin (MULTIVITAMIN) capsule, Take 1 capsule by mouth daily., Disp: , Rfl:    polyvinyl alcohol  (LIQUIFILM TEARS) 1.4 % ophthalmic solution, Place 1 drop into both eyes as needed for  dry eyes., Disp: 15 mL, Rfl: 0   rosuvastatin  (CRESTOR ) 5 MG tablet, Take 1 tablet (5 mg total) by mouth daily., Disp: 90 tablet, Rfl: 3   spironolactone  (ALDACTONE ) 25 MG tablet, Take 0.5 tablets (12.5 mg total) by mouth daily., Disp: 90 tablet, Rfl: 3  "

## 2024-05-26 NOTE — Telephone Encounter (Signed)
 FYI Only or Action Required?: FYI only for provider: appointment scheduled on 05/26/24.  Patient was last seen in primary care on 04/10/2024 by Billy Rasmussen LABOR, [name].  Called Nurse Triage reporting Gout.  Symptoms began several months ago.  Interventions attempted: Prescription medications: prednisone .  Symptoms are: gradually worsening.  Triage Disposition: See HCP Within 4 Hours (Or PCP Triage)  Patient/caregiver understands and will follow disposition?: Yes    Copied from CRM #[id number]. Topic: Clinical - Red Word Triage >> May 26, 2024  8:52 AM Delon HERO wrote: Red Word that prompted transfer to Nurse Triage: Patient is calling to report severe pain in the right foot unable to walk on it. Patient is reporting that this is gout flare up. Requesting a script for prednisone . Patient is open to come into the office this afternoon.     Reason for Disposition  [1] SEVERE pain (e.g., excruciating, unable to do any normal activities) AND [2] not improved after 2 hours of pain medicine  Answer Assessment - Initial Assessment Questions Pt called in to discuss rx of prednisone  for gout flare or appt with PCP. Able to schedule appt for today. Pt reports gout pain has been increasing since this summer but now pain is severe in his R foot at the arch of his foot and ankle. Pt would like to discuss allopurinol  or long standing medication to avoid flares. Pt denies any fever, swelling or numbness. Appointment scheduled for evaluation. Patient agrees with plan of care, and will call back if anything changes, or if symptoms worsen.     1. ONSET: When did the pain start?      On and off since this summer   2. LOCATION: Where is the pain located?     Big toe on L foot; arch of foot and ankle on R foot   3. PAIN: How bad is the pain?    (Scale 1-10; or mild, moderate, severe)     Severe   4. WORK OR EXERCISE: Has there been any recent work or exercise that involved this part of the  body?      None   5. CAUSE: What do you think is causing the foot pain?     Suspects gout   6. OTHER SYMPTOMS: Do you have any other symptoms? (e.g., leg pain, rash, fever, numbness)     None  Protocols used: Foot Pain-A-AH

## 2024-05-26 NOTE — Telephone Encounter (Signed)
 Apt scheduled.

## 2024-05-26 NOTE — Patient Instructions (Signed)
 Low-Purine Diet A low-purine diet involves making choices to limit how much purine you take in. Purine is a kind of uric acid. If you have too much uric acid in your blood, it can cause problems like gout and kidney stones. Eating a low-purine diet can help control those problems. What are tips for following this plan? Shopping Avoid buying things that have high-fructose corn syrup in them. Check for this on food labels. Be sure to check for it in: [employer], such as cookies. Canned fruits. Cereals and cereal bars. Avoid buying meats with a high purine content. These include: Veal. Chicken breast with skin. Lamb. Organ meats, such as liver. Choose dairy products. These may lower uric acid levels. Avoid buying certain types of fish. These include: Anchovies. Trout. Tuna. Sardines. Salmon. Avoid buying drinks with alcohol in them. Alcohol can affect the way your body gets rid of uric acid. Stay away from: [facility]. Hard liquor. Meal planning  Learn which foods do or don't affect you. If you find a food that causes problems, avoid eating that food. If you have any questions about a food item, talk with your health care provider. Eat less meat. When you do eat meat, choose meats that have less purine in them. Eat lots of fruits and vegetables. Even though some vegetables have more purine in them, they don't add as much to the amount of uric acid in your blood as other foods. Have at least one serving of dairy a day. General information If you drink alcohol: Limit how much you have to: 0-1 drink a day if you're male. 0-2 drinks a day if you're male. Know how much alcohol is in your drink. In the [place] one drink is one 12 oz bottle of beer (355 mL), one 5 oz glass of wine (148 mL), or one 1 oz glass of hard liquor (44 mL). Drink more fluids as told. Fluids can help remove uric acid from your body. Work with your provider to make a plan to help you lose weight or stay at a healthy  weight. Losing weight can help lower how much uric acid is in your blood. What foods are recommended? You can have: Fresh or frozen fruits and vegetables. Whole grains, breads, cereals, and pasta. Rice. Beans, peas, and legumes. Nuts and seeds. Dairy products. Fats and oils. The items listed above may not be all the foods and drinks you can have. Talk with an expert in healthy eating called a dietitian to learn more. What foods are not recommended? Limit your intake of foods and drinks high in purines. These include: Beer and other types of alcohol. Meat-based gravy or sauce. Canned or fresh seafood, such as: Anchovies, sardines, herring, salmon, and tuna. Mussels, scallops, shrimp, and crab. Codfish, trout, and haddock. Bacon, veal, chicken breast with skin, and lamb. Organ meats, such as: Liver or kidney. Tripe. Sweetbreads (thymus gland or pancreas). Wild Education officer, environmental. Yeast or yeast extract supplements. Drinks that have been made sweeter with high-fructose corn syrup. These include sodas. Processed foods made with high-fructose corn syrup. The items listed above may not be all the foods and drinks you should limit. Talk with a dietitian to learn more. This information is not intended to replace advice given to you by your health care provider. Make sure you discuss any questions you have with your health care provider. Document Revised: 08/21/2023 Document Reviewed: 08/21/2023 Elsevier Patient Education  2025 [employer].

## 2024-05-29 ENCOUNTER — Ambulatory Visit: Payer: Self-pay | Admitting: Family Medicine

## 2024-06-02 ENCOUNTER — Ambulatory Visit (INDEPENDENT_AMBULATORY_CARE_PROVIDER_SITE_OTHER): Payer: Medicare HMO

## 2024-06-02 DIAGNOSIS — I5042 Chronic combined systolic (congestive) and diastolic (congestive) heart failure: Secondary | ICD-10-CM | POA: Diagnosis not present

## 2024-06-03 LAB — CUP PACEART REMOTE DEVICE CHECK
Battery Remaining Longevity: 86 mo
Battery Remaining Percentage: 85 %
Battery Voltage: 2.99 V
Brady Statistic AP VP Percent: 21 %
Brady Statistic AP VS Percent: 1 %
Brady Statistic AS VP Percent: 79 %
Brady Statistic AS VS Percent: 1 %
Brady Statistic RA Percent Paced: 21 %
Date Time Interrogation Session: 20260102020054
HighPow Impedance: 74 Ohm
Implantable Lead Connection Status: 753985
Implantable Lead Connection Status: 753985
Implantable Lead Connection Status: 753985
Implantable Lead Implant Date: 20240930
Implantable Lead Implant Date: 20240930
Implantable Lead Implant Date: 20240930
Implantable Lead Location: 753858
Implantable Lead Location: 753859
Implantable Lead Location: 753860
Implantable Pulse Generator Implant Date: 20240930
Lead Channel Impedance Value: 1325 Ohm
Lead Channel Impedance Value: 410 Ohm
Lead Channel Impedance Value: 450 Ohm
Lead Channel Pacing Threshold Amplitude: 0.5 V
Lead Channel Pacing Threshold Amplitude: 0.75 V
Lead Channel Pacing Threshold Amplitude: 1.125 V
Lead Channel Pacing Threshold Pulse Width: 0.4 ms
Lead Channel Pacing Threshold Pulse Width: 0.4 ms
Lead Channel Pacing Threshold Pulse Width: 0.5 ms
Lead Channel Sensing Intrinsic Amplitude: 11.8 mV
Lead Channel Sensing Intrinsic Amplitude: 2.4 mV
Lead Channel Setting Pacing Amplitude: 1.5 V
Lead Channel Setting Pacing Amplitude: 1.625
Lead Channel Setting Pacing Amplitude: 1.75 V
Lead Channel Setting Pacing Pulse Width: 0.4 ms
Lead Channel Setting Pacing Pulse Width: 0.4 ms
Lead Channel Setting Sensing Sensitivity: 0.5 mV
Pulse Gen Serial Number: 211030215
Zone Setting Status: 755011

## 2024-06-06 ENCOUNTER — Other Ambulatory Visit: Payer: Self-pay | Admitting: Family Medicine

## 2024-06-06 NOTE — Progress Notes (Signed)
 Remote ICD Transmission

## 2024-06-10 ENCOUNTER — Ambulatory Visit: Payer: Self-pay | Admitting: Cardiovascular Disease

## 2024-06-18 ENCOUNTER — Encounter: Payer: Self-pay | Admitting: Oncology

## 2024-06-20 ENCOUNTER — Encounter: Payer: Self-pay | Admitting: Oncology

## 2024-06-23 ENCOUNTER — Ambulatory Visit: Payer: Self-pay

## 2024-06-23 ENCOUNTER — Telehealth: Admitting: Family

## 2024-06-23 ENCOUNTER — Encounter: Payer: Self-pay | Admitting: Family Medicine

## 2024-06-23 ENCOUNTER — Encounter: Payer: Self-pay | Admitting: Oncology

## 2024-06-23 DIAGNOSIS — M109 Gout, unspecified: Secondary | ICD-10-CM | POA: Diagnosis not present

## 2024-06-23 DIAGNOSIS — N1831 Chronic kidney disease, stage 3a: Secondary | ICD-10-CM

## 2024-06-23 DIAGNOSIS — M25572 Pain in left ankle and joints of left foot: Secondary | ICD-10-CM

## 2024-06-23 MED ORDER — PREDNISONE 20 MG PO TABS: 40.0000 mg | ORAL_TABLET | Freq: Every day | ORAL | 0 refills | Status: AC

## 2024-06-23 NOTE — Progress Notes (Signed)
 " Virtual Visit Consent   Billy Rasmussen, you are scheduled for a virtual visit with a [facility] provider today. Just as with appointments in the office, your consent must be obtained to participate. Your consent will be active for this visit and any virtual visit you may have with one of our providers in the next 365 days. If you have a MyChart account, a copy of this consent can be sent to you electronically.  As this is a virtual visit, video technology does not allow for your provider to perform a traditional examination. This may limit your provider's ability to fully assess your condition. If your provider identifies any concerns that need to be evaluated in person or the need to arrange testing (such as labs, EKG, etc.), we will make arrangements to do so. Although advances in technology are sophisticated, we cannot ensure that it will always work on either your end or our end. If the connection with a video visit is poor, the visit may have to be switched to a telephone visit. With either a video or telephone visit, we are not always able to ensure that we have a secure connection.  By engaging in this virtual visit, you consent to the provision of healthcare and authorize for your insurance to be billed (if applicable) for the services provided during this visit. Depending on your insurance coverage, you may receive a charge related to this service.  I need to obtain your verbal consent now. Are you willing to proceed with your visit today? Billy Rasmussen has provided verbal consent on 06/23/2024 for a virtual visit (video or telephone). Bari Learn, [name]  Date: 06/23/2024 5:01 PM   Virtual Visit via Video Note   I, Bari Learn, connected with  Billy Rasmussen  ([id number], 1946/02/12) on 06/23/24 at  6:00 PM EST by a video-enabled telemedicine application and verified that I am speaking with the correct person using two identifiers.  Location: Patient:  Virtual Visit Location Patient: Home Provider: Virtual Visit Location Provider: Home Office   I discussed the limitations of evaluation and management by telemedicine and the availability of in person appointments. The patient expressed understanding and agreed to proceed.    History of Present Illness: Billy Rasmussen is a 79 y.o. who identifies as a male who was assigned male at birth, and is being seen today for left ankle pain that started yesterday. Has hx of gout and pain feels similar. Reports pain is a 10 out 10 when he walks. States his pain is a 3 out 10 when sitting.   His last Uric Acid level was 8.1 on 05/26/24. He is taking allopurinol  100 mg.   Has CKD and can not tolerate NSAID's.   HPI: HPI  Problems:  Patient Active Problem List   Diagnosis Date Noted   Hospital discharge follow-up 01/04/2024   Sepsis due to undetermined organism (HCC) 12/24/2023   Severe sepsis (HCC) 10/21/2023   CKD stage 3a, GFR 45-59 ml/min (HCC) 08/22/2023   Chronic combined systolic and diastolic CHF (congestive heart failure) (HCC) 08/22/2023   Aortitis 05/19/2023   S/P total thyroidectomy 04/14/2023   HFrEF (heart failure with reduced ejection fraction) (HCC) 10/10/2022   Thrombocytopenia 10/07/2022   Acquired hypothyroidism 10/07/2022   Multiple thyroid  nodules 09/14/2022   Aneurysm of ascending aorta without rupture 09/02/2022   Lumbar foraminal stenosis 03/21/2020   S/P radical cystoprostatectomy 12/28/2019   S/p nephrectomy 12/28/2019   Renal mass 07/15/2019   Nonfunctioning kidney 07/14/2019  OSA (obstructive sleep apnea) 05/12/2019   SIRS (systemic inflammatory response syndrome) (HCC) 04/09/2019   Gastroesophageal reflux disease    BCG cystitis 02/02/2018   Pure hypercholesterolemia 10/02/2016   Malignant neoplasm of urinary bladder (HCC) 10/02/2016   Lipoma 09/30/2016   S/P right TKA 03/30/2016   S/P knee replacement 03/30/2016   Essential hypertension 02/25/2016    Obesity (BMI 30-39.9) 01/07/2016   S/P shoulder replacement 07/20/2013   Shoulder arthritis 04/07/2013    Allergies: Allergies[1] Medications: Current Medications[2]  Observations/Objective: Patient is well-developed, well-nourished in no acute distress.  Resting comfortably  at home.  Head is normocephalic, atraumatic.  No labored breathing.  Speech is clear and coherent with logical content.  Patient is alert and oriented at baseline.  Left ankle swelling, erythemas, tender to touch  Assessment and Plan: 1. Acute left ankle pain (Primary) - predniSONE  (DELTASONE ) 20 MG tablet; Take 2 tablets (40 mg total) by mouth daily with breakfast for 5 days. 2 po daily for 5 days  Dispense: 10 tablet; Refill: 0  2. Acute gout of left ankle, unspecified cause - predniSONE  (DELTASONE ) 20 MG tablet; Take 2 tablets (40 mg total) by mouth daily with breakfast for 5 days. 2 po daily for 5 days  Dispense: 10 tablet; Refill: 0  3. CKD stage 3a, GFR 45-59 ml/min (HCC)  Force fluids Continue allopurinol  100 mg  Avoid NSAID's  Start prednisone   Follow up if symptoms worsen or do not improve   Follow Up Instructions: I discussed the assessment and treatment plan with the patient. The patient was provided an opportunity to ask questions and all were answered. The patient agreed with the plan and demonstrated an understanding of the instructions.  A copy of instructions were sent to the patient via MyChart unless otherwise noted below.     The patient was advised to call back or seek an in-person evaluation if the symptoms worsen or if the condition fails to improve as anticipated.    Bari Learn, [name]    [1]  Allergies Allergen Reactions   Ciprofloxacin  Itching  [2]  Current Outpatient Medications:    predniSONE  (DELTASONE ) 20 MG tablet, Take 2 tablets (40 mg total) by mouth daily with breakfast for 5 days. 2 po daily for 5 days, Disp: 10 tablet, Rfl: 0   Albuterol -Budesonide  90-80  MCG/ACT AERO, Take 2 puffs by mouth 4 (four) times daily as needed (shortness of breath.)., Disp: , Rfl:    allopurinol  (ZYLOPRIM ) 100 MG tablet, Take 1 tablet (100 mg total) by mouth daily. Start AFTER gout flare resolved to prevent flareups., Disp: 90 tablet, Rfl: 0   furosemide  (LASIX ) 20 MG tablet, TAKE 1 TABLET DAILY AS NEEDED IF YOU GAIN MORE THAN 3 POUNDS IN 1 DAY OR 5 POUNDS IN 1 WEEK., Disp: 90 tablet, Rfl: 1   HYDROcodone -acetaminophen  (NORCO/VICODIN) 5-325 MG tablet, Take 1 tablet by mouth every 6 (six) hours as needed for severe pain (pain score 7-10)., Disp: 10 tablet, Rfl: 0   levothyroxine  (SYNTHROID ) 125 MCG tablet, Take 125 mcg by mouth every morning., Disp: , Rfl:    losartan  (COZAAR ) 25 MG tablet, Take 1 tablet (25 mg total) by mouth daily., Disp: 90 tablet, Rfl: 3   metoprolol  succinate (TOPROL -XL) 50 MG 24 hr tablet, TAKE 1 TABLET BY MOUTH EVERY DAY, Disp: 90 tablet, Rfl: 4   Multiple Vitamin (MULTIVITAMIN) capsule, Take 1 capsule by mouth daily., Disp: , Rfl:    polyvinyl alcohol  (LIQUIFILM TEARS) 1.4 % ophthalmic solution, Place 1  drop into both eyes as needed for dry eyes., Disp: 15 mL, Rfl: 0   rosuvastatin  (CRESTOR ) 5 MG tablet, Take 1 tablet (5 mg total) by mouth daily., Disp: 90 tablet, Rfl: 3   spironolactone  (ALDACTONE ) 25 MG tablet, Take 0.5 tablets (12.5 mg total) by mouth daily., Disp: 90 tablet, Rfl: 3  "

## 2024-06-23 NOTE — Patient Instructions (Signed)
Gout  Gout is a condition that causes painful swelling of the joints. Gout is a type of inflammation of the joints (arthritis). This condition is caused by having too much uric acid in the body. Uric acid is a chemical that forms when the body breaks down substances called purines. Purines are important for building body proteins. When the body has too much uric acid, sharp crystals can form and build up inside the joints. This causes pain and swelling. Gout attacks can happen quickly and may be very painful (acute gout). Over time, the attacks can affect more joints and become more frequent (chronic gout). Gout can also cause uric acid to build up under the skin and inside the kidneys. What are the causes? This condition is caused by too much uric acid in your blood. This can happen because: Your kidneys do not remove enough uric acid from your blood. This is the most common cause. Your body makes too much uric acid. This can happen with some cancers and cancer treatments. It can also occur if your body is breaking down too many red blood cells (hemolytic anemia). You eat too many foods that are high in purines. These foods include organ meats and some seafood. Alcohol, especially beer, is also high in purines. A gout attack may be triggered by trauma or stress. What increases the risk? The following factors may make you more likely to develop this condition: Having a family history of gout. Being male and middle-aged. Being male and having gone through menopause. Taking certain medicines, including aspirin, cyclosporine, diuretics, levodopa, and niacin. Having an organ transplant. Having certain conditions, such as: Being obese. Lead poisoning. Kidney disease. A skin condition called psoriasis. Other factors include: Losing weight too quickly. Being dehydrated. Frequently drinking alcohol, especially beer. Frequently drinking beverages that are sweetened with a type of sugar called  fructose. What are the signs or symptoms? An attack of acute gout happens quickly. It usually occurs in just one joint. The most common place is the big toe. Attacks often start at night. Other joints that may be affected include joints of the feet, ankle, knee, fingers, wrist, or elbow. Symptoms of this condition may include: Severe pain. Warmth. Swelling. Stiffness. Tenderness. The affected joint may be very painful to touch. Shiny, red, or purple skin. Chills and fever. Chronic gout may cause symptoms more frequently. More joints may be involved. You may also have white or yellow lumps (tophi) on your hands or feet or in other areas near your joints. How is this diagnosed? This condition is diagnosed based on your symptoms, your medical history, and a physical exam. You may have tests, such as: Blood tests to measure uric acid levels. Removal of joint fluid with a thin needle (aspiration) to look for uric acid crystals. X-rays to look for joint damage. How is this treated? Treatment for this condition has two phases: treating an acute attack and preventing future attacks. Acute gout treatment may include medicines to reduce pain and swelling, including: NSAIDs, such as ibuprofen. Steroids. These are strong anti-inflammatory medicines that can be taken by mouth (orally) or injected into a joint. Colchicine. This medicine relieves pain and swelling when it is taken soon after an attack. It can be given by mouth or through an IV. Preventive treatment may include: Daily use of smaller doses of NSAIDs or colchicine. Use of a medicine that reduces uric acid levels in your blood, such as allopurinol. Changes to your diet. You may need to see   a dietitian about what to eat and drink to prevent gout. Follow these instructions at home: During a gout attack  If directed, put ice on the affected area. To do this: Put ice in a plastic bag. Place a towel between your skin and the bag. Leave the  ice on for 20 minutes, 2-3 times a day. Remove the ice if your skin turns bright red. This is very important. If you cannot feel pain, heat, or cold, you have a greater risk of damage to the area. Raise (elevate) the affected joint above the level of your heart as often as possible. Rest the joint as much as possible. If the affected joint is in your leg, you may be given crutches to use. Follow instructions from your health care provider about eating or drinking restrictions. Avoiding future gout attacks Follow a low-purine diet as told by your dietitian or health care provider. Avoid foods and drinks that are high in purines, including liver, kidney, anchovies, asparagus, herring, mushrooms, mussels, and beer. Maintain a healthy weight or lose weight if you are overweight. If you want to lose weight, talk with your health care provider. Do not lose weight too quickly. Start or maintain an exercise program as told by your health care provider. Eating and drinking Avoid drinking beverages that contain fructose. Drink enough fluids to keep your urine pale yellow. If you drink alcohol: Limit how much you have to: 0-1 drink a day for women who are not pregnant. 0-2 drinks a day for men. Know how much alcohol is in a drink. In the [place] one drink equals one 12 oz bottle of beer (355 mL), one 5 oz glass of wine (148 mL), or one 1 oz glass of hard liquor (44 mL). General instructions Take over-the-counter and prescription medicines only as told by your health care provider. Ask your health care provider if the medicine prescribed to you requires you to avoid driving or using machinery. Return to your normal activities as told by your health care provider. Ask your health care provider what activities are safe for you. Keep all follow-up visits. This is important. Where to find more information [employer]: www.niams.nih.gov Contact a health care provider if you have: Another  gout attack. Continuing symptoms of a gout attack after 10 days of treatment. Side effects from your medicines. Chills or a fever. Burning pain when you urinate. Pain in your lower back or abdomen. Get help right away if you: Have severe or uncontrolled pain. Cannot urinate. Summary Gout is painful swelling of the joints caused by having too much uric acid in the body. The most common site for gout to occur is in the big toe, but it can affect other joints in the body. Medicines and dietary changes can help to prevent and treat gout attacks. This information is not intended to replace advice given to you by your health care provider. Make sure you discuss any questions you have with your health care provider. Document Revised: 02/19/2021 Document Reviewed: 02/19/2021 [employer] Patient Education  2024 [employer]  

## 2024-06-23 NOTE — Telephone Encounter (Signed)
 FYI Only or Action Required?: Action required by provider: update on patient condition and request for steroids .  Patient was last seen in primary care on 05/26/2024 by Billy Norene HERO, DO.  Called Nurse Triage reporting Gout.  Symptoms began yesterday.  Interventions attempted: Prescription medications: hydrocodone  acetaminophen  and Rest, hydration, or home remedies.  Symptoms are: gradually worsening.  Triage Disposition: See HCP Within 4 Hours (Or PCP Triage)  Patient/caregiver understands and will follow disposition?: NO: prefers not to come in wanting steroid pack called in. Pt stated he responds to taking 1 dose of steroids per day and usually by day 2 he is better. Doesn't like dose pak.           Message from Billy Rasmussen sent at 06/23/2024  9:57 AM EST  Reason for Triage: urosemide (LASIX ) 20 MG tablet [[id number] requesting due to Gout flair up the 5 pill instead of the multi pill. PT is severe amount of pain when walking/Anything hits it. Pain level 9/10. PT is taking medication above but not working.   Reason for Disposition  [1] SEVERE pain (e.g., excruciating, unable to do any normal activities) AND [2] not improved after 2 hours of pain medicine  Answer Assessment - Initial Assessment Questions 1. ONSET: When did the pain start?      Yesterday pm  2. LOCATION: Where is the pain located?      Left foot and lateral foot 3. PAIN: How bad is the pain?    (Scale 1-10; or mild, moderate, severe)     5: sitting  10/10 when standing 4. WORK OR EXERCISE: Has there been any recent work or exercise that involved this part of the body?      Working and trying to get prepared for cold winter weather 5. CAUSE: What do you think is causing the foot pain?     Gout flare 6. OTHER SYMPTOMS: Do you have any other symptoms? (e.g., leg pain, rash, fever, numbness)     Mild swelling  Protocols used: Foot Pain-A-AH

## 2024-06-27 ENCOUNTER — Telehealth: Payer: Medicare Other | Admitting: Adult Health

## 2024-06-27 DIAGNOSIS — G4733 Obstructive sleep apnea (adult) (pediatric): Secondary | ICD-10-CM | POA: Diagnosis not present

## 2024-06-27 NOTE — Progress Notes (Signed)
 SABRA

## 2024-06-27 NOTE — Patient Instructions (Signed)
 Continue using CPAP nightly and greater than 4 hours each night Mask refitting ordered If your symptoms worsen or you develop new symptoms please let us know.

## 2024-06-27 NOTE — Progress Notes (Signed)
 "    PATIENT: Billy Rasmussen DOB: 10/10/1945  REASON FOR VISIT: follow up HISTORY FROM: patient   Virtual Visit via Video Note  I connected with Billy Rasmussen on 06/27/24 at  1:45 PM EST by a video enabled telemedicine application located remotely at [facility] and verified that I am speaking with the correct person using two identifiers who was located at their own home.   I discussed the limitations of evaluation and management by telemedicine and the availability of in person appointments. The patient expressed understanding and agreed to proceed.   PATIENT: Billy Rasmussen DOB: 1946-03-30  REASON FOR VISIT: follow up HISTORY FROM: patient  HISTORY OF PRESENT ILLNESS: Today 06/27/24:  Billy Rasmussen is a 79 y.o. male with a history of obstructive sleep apnea on CPAP. Returns today for follow-up. His last study was in 2024 and showed severe sleep apnea.  He currently wears a fullface mask but he states often his chin falls out of the mask.  He does need to change out his straps for the patient.  His download is below     07/02/23: Billy Rasmussen is a 79 y.o. male with a history of OSA on CPAP . Returns today for follow-up. Reports that CPAP is working well. Needs new supplies. DL is below.       REVIEW OF SYSTEMS: Out of a complete 14 system review of symptoms, the patient complains only of the following symptoms, and all other reviewed systems are negative.  ALLERGIES: Allergies  Allergen Reactions   Ciprofloxacin  Itching    HOME MEDICATIONS: Outpatient Medications Prior to Visit  Medication Sig Dispense Refill   Albuterol -Budesonide  90-80 MCG/ACT AERO Take 2 puffs by mouth 4 (four) times daily as needed (shortness of breath.).     allopurinol  (ZYLOPRIM ) 100 MG tablet Take 1 tablet (100 mg total) by mouth daily. Start AFTER gout flare resolved to prevent flareups. 90 tablet 0   furosemide   (LASIX ) 20 MG tablet TAKE 1 TABLET DAILY AS NEEDED IF YOU GAIN MORE THAN 3 POUNDS IN 1 DAY OR 5 POUNDS IN 1 WEEK. 90 tablet 1   HYDROcodone -acetaminophen  (NORCO/VICODIN) 5-325 MG tablet Take 1 tablet by mouth every 6 (six) hours as needed for severe pain (pain score 7-10). 10 tablet 0   levothyroxine  (SYNTHROID ) 125 MCG tablet Take 125 mcg by mouth every morning.     losartan  (COZAAR ) 25 MG tablet Take 1 tablet (25 mg total) by mouth daily. 90 tablet 3   metoprolol  succinate (TOPROL -XL) 50 MG 24 hr tablet TAKE 1 TABLET BY MOUTH EVERY DAY 90 tablet 4   Multiple Vitamin (MULTIVITAMIN) capsule Take 1 capsule by mouth daily.     polyvinyl alcohol  (LIQUIFILM TEARS) 1.4 % ophthalmic solution Place 1 drop into both eyes as needed for dry eyes. 15 mL 0   predniSONE  (DELTASONE ) 20 MG tablet Take 2 tablets (40 mg total) by mouth daily with breakfast for 5 days. 2 po daily for 5 days 10 tablet 0   rosuvastatin  (CRESTOR ) 5 MG tablet Take 1 tablet (5 mg total) by mouth daily. 90 tablet 3   spironolactone  (ALDACTONE ) 25 MG tablet Take 0.5 tablets (12.5 mg total) by mouth daily. 90 tablet 3   No facility-administered medications prior to visit.    PAST MEDICAL HISTORY: Past Medical History:  Diagnosis Date   AICD (automatic cardioverter/defibrillator) present    [employer] Jude   Anemia aprox. may-1-24   Arthritis    knees, shoulders,  elbows   Bladder cancer [facility])    urologist-  dr nieves   CHF (congestive heart failure) (HCC)    Chronic kidney disease    left kidney removed   Diverticulosis of colon    Fibromyalgia    GERD (gastroesophageal reflux disease)    History of diverticulitis of colon    History of gastric ulcer    due to aleve   Hypertension    Lower urinary tract symptoms (LUTS)    Myocardial infarction (HCC) may 1-24   OSA (obstructive sleep apnea)    Wears CPAP nightly   Oxygen  deficiency    Pericarditis 05/17/2023   PONV (postoperative nausea and vomiting)    Psoriasis     Sepsis (HCC)    january 2021   Sleep apnea    Thyroid  disease    biopsy on 10-08-22   Tinnitus    right ear more, has tranmitter in right ear removable at hs   Ulcer    Wears glasses    Wears partial dentures     PAST SURGICAL HISTORY: Past Surgical History:  Procedure Laterality Date   APPENDECTOMY     BIV ICD INSERTION CRT-D N/A 03/01/2023   Procedure: BIV ICD INSERTION CRT-D;  Surgeon: Nancey Eulas BRAVO, MD;  [facility];  Service: Cardiovascular;  Laterality: N/A;   COLECTOMY W/ COLOSTOMY  1996   W/   APPENDECTOMY   COLONOSCOPY N/A 05/11/2018   Procedure: COLONOSCOPY;  Surgeon: Shaaron Lamar HERO, MD;  [facility];  Service: Endoscopy;  Laterality: N/A;  9:30   COLOSTOMY TAKEDOWN  1996   CYSTOSCOPY WITH BIOPSY N/A 12/05/2013   Procedure: CYSTO BLADDER BIOPSY AND FULGERATION;  Surgeon: Donnice Nieves, MD;  Location: [facility];  Service: Urology;  Laterality: N/A;   CYSTOSCOPY WITH BIOPSY Bilateral 11/13/2014   Procedure: CYSTOSCOPY WITH  BLADDER BIOPSY FULGERATION AND BILATERAL RETROGRADE PYELOGRAMS;  Surgeon: Donnice Nieves, MD;  Location: [facility];  Service: Urology;  Laterality: Bilateral;   CYSTOSCOPY WITH FULGERATION N/A 01/18/2018   Procedure: CYSTOSCOPY WITH FULGERATION/ BLADDER BIOPSY;  Surgeon: Nieves Donnice, MD;  Location[facility];  Service: Urology;  Laterality: N/A;   CYSTOSCOPY WITH INJECTION N/A 11/09/2018   Procedure: CYSTOSCOPY WITH INJECTION OF INDOCYANINE GREEN  DYE;  Surgeon: Alvaro Hummer, MD;  Location: [facility];  Service: Urology;  Laterality: N/A;   CYSTOSCOPY WITH INSERTION OF UROLIFT N/A 01/18/2018   Procedure: CYSTOSCOPY WITH INSERTION OF UROLIFT;  Surgeon: Nieves Donnice, MD;  Location: [facility];  Service: Urology;  Laterality: N/A;   CYSTOSCOPY/URETEROSCOPY/HOLMIUM LASER Left 02/17/2019   Procedure: URETEROSCOPY WITH BALLOON DILATION  AND  NEPHROSTOGRAM;  Surgeon: Alvaro Hummer, MD;  Location: [facility];  Service: Urology;  Laterality: Left;  1 HR   EXCISION RIGHT UPPER ARM LIPOMA  2005   FRACTURE SURGERY     HEMORROIDECTOMY     INGUINAL HERNIA REPAIR Left 1984   IR NEPHROSTOMY PLACEMENT LEFT  01/05/2019   JOINT REPLACEMENT     both  knees, and both shoulders joints replaced.   KNEE ARTHROSCOPY Left X3  LAST ONE  2002   ORIF LEFT HUMEROUS FX  1976   POLYPECTOMY  05/11/2018   Procedure: POLYPECTOMY;  Surgeon: Shaaron Lamar HERO, MD;  [facility];  Service: Endoscopy;;   PROSTATE SURGERY     RIGHT HEART CATH AND CORONARY ANGIOGRAPHY N/A 10/09/2022   Procedure: RIGHT HEART CATH AND CORONARY ANGIOGRAPHY;  Surgeon:  Thukkani, Arun K, MD;  [facility];  Service: Cardiovascular;  Laterality: N/A;   ROBOT ASSISTED LAPAROSCOPIC NEPHRECTOMY Left 07/14/2019   Procedure: XI ROBOTIC ASSISTED LAPAROSCOPIC RETROPERITONEAL NEPHRECTOMY;  Surgeon: Alvaro Hummer, MD;  Location: [facility];  Service: Urology;  Laterality: Left;  3 HRS   SMALL INTESTINE SURGERY     THYROIDECTOMY Bilateral 04/14/2023   Procedure: TOTAL THYROIDECTOMY;  Surgeon: Jesus Oliphant, MD;  Location: [facility] OR;  Service: ENT;  Laterality: Bilateral;   TONSILLECTOMY  AS CHILD   TOTAL KNEE ARTHROPLASTY Left 2006   REVISION 2007  (AFTER I & D WITH ANTIBIOTIC SPACER PROCEDURE FOR STEPH INFECTION)   TOTAL KNEE ARTHROPLASTY Right 03/30/2016   Procedure: RIGHT TOTAL KNEE ARTHROPLASTY;  Surgeon: Donnice Car, MD;  Location: [facility];  Service: Orthopedics;  Laterality: Right;   TOTAL SHOULDER ARTHROPLASTY Right 04/06/2013   Procedure: RIGHT TOTAL SHOULDER ARTHROPLASTY;  Surgeon: Franky CHRISTELLA Pointer, MD;  Location: [facility];  Service: Orthopedics;  Laterality: Right;   TOTAL SHOULDER ARTHROPLASTY Left 07/20/2013   Procedure: LEFT TOTAL SHOULDER ARTHROPLASTY;  Surgeon: Franky CHRISTELLA Pointer, MD;  Location: [facility];  Service: Orthopedics;  Laterality: Left;    TRANSURETHRAL RESECTION OF BLADDER TUMOR N/A 11/12/2015   Procedure: TRANSURETHRAL RESECTION OF BLADDER TUMOR (TURBT);  Surgeon: Donnice Brooks, MD;  Location: [facility];  Service: Urology;  Laterality: N/A;   TRANSURETHRAL RESECTION OF BLADDER TUMOR WITH GYRUS (TURBT-GYRUS) N/A 12/05/2013   Procedure: TRANSURETHRAL RESECTION OF BLADDER TUMOR WITH GYRUS (TURBT-GYRUS);  Surgeon: Donnice Brooks, MD;  Location: [facility];  Service: Urology;  Laterality: N/A;    FAMILY HISTORY: Family History  Problem Relation Age of Onset   Alzheimer's disease Mother    Heart attack Father    Heart disease Father    Arthritis Father    Hearing loss Father    Hypertension Father    Irregular heart beat Brother        DEFIB.   Congestive Heart Failure Brother    Heart disease Brother    Diabetes Daughter    Sleep apnea Neg Hx     SOCIAL HISTORY: 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   Tobacco Use   Smoking status: Former    Current packs/day: 0.00    Average packs/day: 2.0 packs/day for 40.0 years (80.0 ttl pk-yrs)    Types: Cigarettes    Start date: 06/01/1957    Quit date: 06/01/1997    Years since quitting: 27.0    Passive exposure: Past   Smokeless tobacco: Never  Vaping Use   Vaping status: Never Used  Substance and Sexual Activity   Alcohol  use: No   Drug use: No   Sexual activity: Not Currently    Comment: vesectomy  Other Topics Concern   Not on file  Social History Narrative   One level home with wife    [id number] - 57 year old MIL lives with them   Caffiene 2 cups daily, tea occas.   Retired, visual merchandiser   Social Drivers of Health   Tobacco Use: Medium Risk (06/23/2024)    Patient History    Smoking Tobacco Use: Former    Smokeless Tobacco Use: Never  Passive Exposure: Past  Physicist, Medical Strain: Low Risk (03/27/2024)   Overall Financial Resource Strain (CARDIA)    Difficulty of Paying Living Expenses: Not hard at all  Food Insecurity: No Food Insecurity (04/21/2024)   [facility]   Epic    Within the past 12 months, you worried that your food would run out before you got the money to buy more.: Never true    Within the past 12 months, the food you bought just didn't last and you didn't have money to get more.: Never true  Transportation Needs: No Transportation Needs (04/21/2024)   Received from [facility] - Transportation    Lack of Transportation (Medical): No    Lack of Transportation (Non-Medical): No  Physical Activity: Insufficiently Active (03/27/2024)   Exercise Vital Sign    Days of Exercise per Week: 1 day    Minutes of Exercise per Session: 10 min  Stress: No Stress Concern Present (03/27/2024)   [employer] of Occupational Health - Occupational Stress Questionnaire    Feeling of Stress: Only a little  Social Connections: Unknown (03/27/2024)   Social Connection and Isolation Panel    Frequency of Communication with Friends and Family: Twice a week    Frequency of Social Gatherings with Friends and Family: Twice a week    Attends Religious Services: Not on Marketing Executive or Organizations: Yes    Attends Banker Meetings: 1 to 4 times per year    Marital Status: Married  Catering Manager Violence: Not At Risk (12/28/2023)   Epic    Fear of Current or Ex-Partner: No    Emotionally Abused: No    Physically Abused: No    Sexually Abused: No  Depression (PHQ2-9): Low Risk (05/26/2024)   Depression (PHQ2-9)    PHQ-2 Score: 1  Alcohol  Screen: Low Risk (08/16/2023)   Alcohol  Screen    Last Alcohol  Screening Score (AUDIT): 0  Housing: Low Risk (03/27/2024)   Epic     Unable to Pay for Housing in the Last Year: No    Number of Times Moved in the Last Year: 0    Homeless in the Last Year: No  Utilities: Low Risk (04/21/2024)   Received from [facility]   Utilities    Within the past 12 months, have you been unable to get utilities(heat, electricity) when it was really needed?: No  Health Literacy: Adequate Health Literacy (08/16/2023)   B1300 Health Literacy    Frequency of need for help with medical instructions: Never      PHYSICAL EXAM Generalized: Well developed, in no acute distress   Neurological examination  Mentation: Alert oriented to time, place, history taking. Follows all commands speech and language fluent Cranial nerve II-XII:Extraocular movements were full. Facial symmetry noted.   DIAGNOSTIC DATA (LABS, IMAGING, TESTING) - I reviewed patient records, labs, notes, testing and imaging myself where available.  Lab Results  Component Value Date   WBC 5.4 01/04/2024   HGB 12.8 (L) 01/04/2024   HCT 38.8 01/04/2024   MCV 99 (H) 01/04/2024   PLT 224 01/04/2024      Component Value Date/Time   NA 138 05/26/2024 1100   K 4.6 05/26/2024 1100   CL 100 05/26/2024 1100   CO2 23 05/26/2024 1100   GLUCOSE 120 (H) 05/26/2024 1100   GLUCOSE 101 (H) 12/26/2023 0504   BUN 24 05/26/2024 1100   CREATININE 1.48 (H) 05/26/2024  1100   CREATININE 1.44 (H) 08/31/2023 1031   CALCIUM  10.0 05/26/2024 1100   PROT 7.9 01/26/2024 0951   ALBUMIN  4.7 01/26/2024 0951   AST 46 (H) 01/26/2024 0951   AST 98 (H) 08/31/2023 1031   ALT 24 01/26/2024 0951   ALT 68 (H) 08/31/2023 1031   ALKPHOS 84 01/26/2024 0951   BILITOT 0.7 01/26/2024 0951   BILITOT 0.8 08/31/2023 1031   GFRNONAA 45 (L) 12/26/2023 0504   GFRNONAA 51 (L) 08/31/2023 1031   GFRAA 56 (L) 04/10/2020 1421   Lab Results  Component Value Date   CHOL 121 04/18/2024   HDL 33 (L) 04/18/2024   LDLCALC 60 04/18/2024   LDLDIRECT 96 02/25/2016   TRIG 161 (H) 04/18/2024   CHOLHDL 3.7  04/18/2024   Lab Results  Component Value Date   HGBA1C 5.1 07/07/2022   Lab Results  Component Value Date   VITAMINB12 391 08/22/2023   Lab Results  Component Value Date   TSH 4.190 09/13/2023      ASSESSMENT AND PLAN 79 y.o. year old male  has a past medical history of AICD (automatic cardioverter/defibrillator) present, Anemia (aprox. may-1-24), Arthritis, Bladder cancer (HCC), CHF (congestive heart failure) (HCC), Chronic kidney disease, Diverticulosis of colon, Fibromyalgia, GERD (gastroesophageal reflux disease), History of diverticulitis of colon, History of gastric ulcer, Hypertension, Lower urinary tract symptoms (LUTS), Myocardial infarction (HCC) (may 1-24), OSA (obstructive sleep apnea), Oxygen  deficiency, Pericarditis (05/17/2023), PONV (postoperative nausea and vomiting), Psoriasis, Sepsis (HCC), Sleep apnea, Thyroid  disease, Tinnitus, Ulcer, Wears glasses, and Wears partial dentures. here with:  OSA on CPAP  CPAP compliance excellent Residual AHI is good Encouraged patient to continue using CPAP nightly and > 4 hours each night Order placed for mask refitting F/U in 1 year or sooner if needed   Duwaine Russell, MSN, NP-C 06/27/2024, 1:45 PM [facility] [address], [address] [place], [place] [address] [phone]  "

## 2024-06-28 ENCOUNTER — Other Ambulatory Visit: Payer: Self-pay

## 2024-07-05 ENCOUNTER — Encounter: Payer: Self-pay | Admitting: Cardiology

## 2024-07-05 NOTE — Telephone Encounter (Signed)
 error

## 2024-07-25 ENCOUNTER — Other Ambulatory Visit

## 2024-08-16 ENCOUNTER — Ambulatory Visit: Payer: Self-pay

## 2024-08-22 ENCOUNTER — Ambulatory Visit

## 2025-06-26 ENCOUNTER — Telehealth: Admitting: Adult Health
